# Patient Record
Sex: Female | Born: 1955
Health system: Southern US, Community
[De-identification: ages and names within clinical notes are randomized; demographics above are authoritative.]

## PROBLEM LIST (undated history)

## (undated) ENCOUNTER — Inpatient Hospital Stay (HOSPITAL_BASED_OUTPATIENT_CLINIC_OR_DEPARTMENT_OTHER): Payer: Self-pay | Admitting: Specialist

## (undated) ENCOUNTER — Ambulatory Visit (HOSPITAL_BASED_OUTPATIENT_CLINIC_OR_DEPARTMENT_OTHER): Payer: Self-pay | Admitting: Podiatrist

## (undated) ENCOUNTER — Encounter (HOSPITAL_BASED_OUTPATIENT_CLINIC_OR_DEPARTMENT_OTHER): Payer: Self-pay

## (undated) VITALS — BP 136/78 | HR 82 | Temp 98.0°F | Resp 18

## (undated) VITALS — BP 134/72 | HR 71 | Temp 97.7°F | Resp 18 | Wt 240.0 lb

## (undated) VITALS — BP 131/66 | HR 77 | Temp 97.8°F | Resp 18 | Ht 61.0 in | Wt 195.0 lb

## (undated) DIAGNOSIS — F1920 Other psychoactive substance dependence, uncomplicated: Secondary | ICD-10-CM

## (undated) DIAGNOSIS — M1990 Unspecified osteoarthritis, unspecified site: Secondary | ICD-10-CM

## (undated) DIAGNOSIS — J449 Chronic obstructive pulmonary disease, unspecified: Secondary | ICD-10-CM

## (undated) DIAGNOSIS — I639 Cerebral infarction, unspecified: Secondary | ICD-10-CM

## (undated) DIAGNOSIS — E669 Obesity, unspecified: Secondary | ICD-10-CM

## (undated) DIAGNOSIS — F419 Anxiety disorder, unspecified: Secondary | ICD-10-CM

## (undated) DIAGNOSIS — F32A Depression, unspecified: Secondary | ICD-10-CM

## (undated) DIAGNOSIS — F329 Major depressive disorder, single episode, unspecified: Secondary | ICD-10-CM

## (undated) DIAGNOSIS — E119 Type 2 diabetes mellitus without complications: Secondary | ICD-10-CM

## (undated) DIAGNOSIS — M7989 Other specified soft tissue disorders: Secondary | ICD-10-CM

## (undated) DIAGNOSIS — J45909 Unspecified asthma, uncomplicated: Secondary | ICD-10-CM

## (undated) DIAGNOSIS — R7303 Prediabetes: Secondary | ICD-10-CM

## (undated) DIAGNOSIS — K829 Disease of gallbladder, unspecified: Secondary | ICD-10-CM

## (undated) DIAGNOSIS — K219 Gastro-esophageal reflux disease without esophagitis: Secondary | ICD-10-CM

## (undated) DIAGNOSIS — I10 Essential (primary) hypertension: Secondary | ICD-10-CM

## (undated) DIAGNOSIS — G4730 Sleep apnea, unspecified: Secondary | ICD-10-CM

## (undated) DIAGNOSIS — M549 Dorsalgia, unspecified: Secondary | ICD-10-CM

## (undated) DIAGNOSIS — M2550 Pain in unspecified joint: Secondary | ICD-10-CM

## (undated) DIAGNOSIS — E785 Hyperlipidemia, unspecified: Secondary | ICD-10-CM

## (undated) DIAGNOSIS — E039 Hypothyroidism, unspecified: Secondary | ICD-10-CM

## (undated) DIAGNOSIS — I011 Acute rheumatic endocarditis: Secondary | ICD-10-CM

## (undated) DIAGNOSIS — G5761 Lesion of plantar nerve, right lower limb: Secondary | ICD-10-CM

## (undated) DIAGNOSIS — E559 Vitamin D deficiency, unspecified: Secondary | ICD-10-CM

## (undated) DIAGNOSIS — E2740 Unspecified adrenocortical insufficiency: Secondary | ICD-10-CM

## (undated) DIAGNOSIS — D649 Anemia, unspecified: Secondary | ICD-10-CM

## (undated) DIAGNOSIS — D490 Neoplasm of unspecified behavior of digestive system: Secondary | ICD-10-CM

## (undated) DIAGNOSIS — R002 Palpitations: Secondary | ICD-10-CM

## (undated) DIAGNOSIS — C801 Malignant (primary) neoplasm, unspecified: Secondary | ICD-10-CM

## (undated) DIAGNOSIS — G709 Myoneural disorder, unspecified: Secondary | ICD-10-CM

## (undated) DIAGNOSIS — M797 Fibromyalgia: Secondary | ICD-10-CM

## (undated) HISTORY — PX: PR ANES IPER LOWER ABD W/LAPS RAD HYSTERECTOMY: 00846

## (undated) HISTORY — PX: ENDOMETRIAL ABLTJ THERMAL W/O HYSTEROSCOPIC GID: REP180

## (undated) HISTORY — PX: PR ANES HRNA REPAIR UPR ABD TABDL RPR DIPHRG HRNA: 00756

## (undated) HISTORY — PX: RPR 1ST INGUN HRNA PRETERM INFT RDC: PRO125

## (undated) HISTORY — DX: Unspecified asthma, uncomplicated: J45.909

## (undated) HISTORY — DX: Unspecified osteoarthritis, unspecified site: M19.90

## (undated) HISTORY — PX: ABDOMINAL HYSTERECTOMY: SHX81

## (undated) HISTORY — DX: Other specified soft tissue disorders: M79.89

## (undated) HISTORY — DX: Palpitations: R00.2

## (undated) HISTORY — DX: Anemia, unspecified: D64.9

## (undated) HISTORY — DX: Lesion of plantar nerve, right lower limb: G57.61

## (undated) HISTORY — DX: Disease of gallbladder, unspecified: K82.9

## (undated) HISTORY — DX: Malignant (primary) neoplasm, unspecified: C80.1

## (undated) HISTORY — DX: Acute rheumatic endocarditis: I01.1

## (undated) HISTORY — PX: THYROIDECTOMY: SHX17

## (undated) HISTORY — DX: Dorsalgia, unspecified: M54.9

## (undated) HISTORY — PX: HAMMER TOE SURGERY: SHX385

## (undated) HISTORY — DX: Prediabetes: R73.03

## (undated) HISTORY — PX: CHOLECYSTECTOMY: SHX55

## (undated) HISTORY — DX: Pain in unspecified joint: M25.50

## (undated) HISTORY — DX: Vitamin D deficiency, unspecified: E55.9

## (undated) SURGERY — OSTEOTOMY, METATARSAL BONE
Anesthesia: Monitor Anesthesia Care | Laterality: Right

## (undated) SURGERY — ARTHROPLASTY, KNEE, TOTAL
Anesthesia: Spinal with Block | Laterality: Right

---
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
## 1998-02-12 ENCOUNTER — Ambulatory Visit (HOSPITAL_COMMUNITY): Admission: RE | Admit: 1998-02-12 | Discharge: 1998-02-12 | Payer: Self-pay | Admitting: General Surgery

## 1999-03-19 ENCOUNTER — Other Ambulatory Visit: Admission: RE | Admit: 1999-03-19 | Discharge: 1999-03-19 | Payer: Self-pay | Admitting: Obstetrics and Gynecology

## 1999-06-10 ENCOUNTER — Ambulatory Visit (HOSPITAL_COMMUNITY): Admission: RE | Admit: 1999-06-10 | Discharge: 1999-06-10 | Payer: Self-pay | Admitting: Gastroenterology

## 1999-10-27 ENCOUNTER — Emergency Department (HOSPITAL_BASED_OUTPATIENT_CLINIC_OR_DEPARTMENT_OTHER): Payer: Self-pay | Admitting: Emergency Medicine

## 1999-12-16 ENCOUNTER — Emergency Department (HOSPITAL_BASED_OUTPATIENT_CLINIC_OR_DEPARTMENT_OTHER): Payer: Self-pay | Admitting: Emergency Medicine

## 2000-01-25 ENCOUNTER — Emergency Department (HOSPITAL_BASED_OUTPATIENT_CLINIC_OR_DEPARTMENT_OTHER): Payer: Self-pay | Admitting: Neurology

## 2000-02-18 ENCOUNTER — Emergency Department (HOSPITAL_BASED_OUTPATIENT_CLINIC_OR_DEPARTMENT_OTHER): Payer: Self-pay | Admitting: Emergency Medicine

## 2000-02-29 ENCOUNTER — Emergency Department (HOSPITAL_BASED_OUTPATIENT_CLINIC_OR_DEPARTMENT_OTHER): Payer: Self-pay | Admitting: Internal Medicine

## 2000-03-20 ENCOUNTER — Emergency Department (HOSPITAL_BASED_OUTPATIENT_CLINIC_OR_DEPARTMENT_OTHER): Payer: Self-pay | Admitting: Emergency Medicine

## 2000-03-20 LAB — BLOOD COUNT COMPLETE AUTO&AUTO DIFRNTL WBC
BASOPHIL %: 0.7 % (ref 0–2)
EOSINOPHIL %: 0.3 % (ref 0–5)
HEMATOCRIT: 42.9 % (ref 34.9–46.9)
HEMOGLOBIN: 14 g/dL (ref 11.7–15.7)
LYMPHOCYTE %: 10.8 % — ABNORMAL LOW (ref 20–45)
MEAN CORP HGB CONC: 32.7 g/dL (ref 31.4–35.8)
MEAN CORPUSCULAR HGB: 28.7 pg (ref 26.4–34.0)
MEAN CORPUSCULAR VOL: 87.7 fl (ref 80.5–99.7)
MEAN PLATELET VOLUME: 8.4 fL — ABNORMAL LOW (ref 9.0–11.8)
MONOCYTE %: 2.5 % (ref 0–12)
NEUTROPHIL %: 85.7 % — ABNORMAL HIGH (ref 40–70)
PLATELET COUNT: 267 10*3/uL (ref 130–400)
RBC DISTRIBUTION WIDTH: 12.2 % (ref 11.0–15.0)
RED BLOOD CELL COUNT: 4.89 M/uL (ref 3.80–5.20)
WHITE BLOOD CELL COUNT: 7.3 10*3/uL (ref 4.5–11.0)

## 2000-03-20 LAB — SERUM DRUG SCREEN 6 DRUGS
ACETAMINOPHEN: <10 ug/ml (ref 0–25)
BARBITURATE: NEGATIVE
BENZODIAZEPINE: NEGATIVE
ETHANOL: NOT DETECTED mg/dl
SALICYLATE: <3 mg/dl (ref 0–20)
TRICYCLIC ANTIDEPRESSANTS: NEGATIVE

## 2000-03-20 LAB — BASIC METABOLIC PANEL
BUN (UREA NITROGEN): 14 mg/dl (ref 6–20)
CALCIUM: 8.7 mg/dl (ref 8.4–10.2)
CARBON DIOXIDE: 24 mEQ/L (ref 22–31)
CHLORIDE: 105 mEQ/L (ref 96–106)
CREATININE: 0.9 mg/dl (ref 0.4–1.2)
Glucose Random: 124 mg/dl — ABNORMAL HIGH (ref 70–110)
POTASSIUM: 4.2 mEQ/L (ref 3.4–5.0)
SODIUM: 141 mEQ/L (ref 134–145)

## 2000-03-20 LAB — URINE DRUG SCREEN 2 DRUGS
COCAINE METABOLITES URINE: POSITIVE — ABNORMAL HIGH
OPIATES URINE: POSITIVE — ABNORMAL HIGH

## 2000-03-23 ENCOUNTER — Other Ambulatory Visit: Admission: RE | Admit: 2000-03-23 | Discharge: 2000-03-23 | Payer: Self-pay | Admitting: Obstetrics and Gynecology

## 2000-04-30 ENCOUNTER — Emergency Department (HOSPITAL_BASED_OUTPATIENT_CLINIC_OR_DEPARTMENT_OTHER): Payer: Self-pay | Admitting: Emergency Medicine

## 2000-04-30 LAB — URINE CULTURE/COLONY COUNT: URINE CULTURE/COLONY COUNT: NO GROWTH

## 2000-11-14 ENCOUNTER — Emergency Department (HOSPITAL_BASED_OUTPATIENT_CLINIC_OR_DEPARTMENT_OTHER): Payer: Self-pay | Admitting: Emergency Medicine

## 2001-05-10 ENCOUNTER — Other Ambulatory Visit: Admission: RE | Admit: 2001-05-10 | Discharge: 2001-05-10 | Payer: Self-pay | Admitting: Obstetrics and Gynecology

## 2001-05-23 ENCOUNTER — Emergency Department: Payer: Self-pay | Admitting: Emergency Medicine

## 2001-05-25 ENCOUNTER — Emergency Department: Payer: Self-pay | Admitting: Emergency Medicine

## 2001-07-30 ENCOUNTER — Emergency Department: Payer: Self-pay | Admitting: Emergency Medicine

## 2001-07-30 LAB — XR ANKLE RIGHT MINIMUM 3 VIEWS

## 2001-07-30 LAB — XR KNEE RIGHT MINIMUM 4 VIEWS

## 2001-08-24 ENCOUNTER — Emergency Department: Payer: Self-pay | Admitting: Emergency Medicine

## 2001-11-22 ENCOUNTER — Emergency Department: Payer: Self-pay | Admitting: Emergency Medicine

## 2002-06-04 LAB — URINALYSIS
BILIRUBIN, URINE: NEGATIVE
CASTS: NONE SEEN PER LPF
CRYSTALS: NONE SEEN
GLUCOSE, URINE: NEGATIVE MG/DL
KETONE, URINE: NEGATIVE MG/DL
NITRITE, URINE: NEGATIVE
PH URINE: 5.5 (ref 5.0–8.0)
PROTEIN, URINE: NEGATIVE MG/DL
SPECIFIC GRAVITY URINE: 1.015 (ref 1.003–1.035)
SQUAMOUS EPITHELIAL CELLS: >10 PER LPF — ABNORMAL HIGH (ref 0–2)

## 2002-06-20 ENCOUNTER — Other Ambulatory Visit: Admission: RE | Admit: 2002-06-20 | Discharge: 2002-06-20 | Payer: Self-pay | Admitting: Obstetrics and Gynecology

## 2002-07-21 ENCOUNTER — Emergency Department: Payer: Self-pay | Admitting: Emergency Medicine

## 2002-08-29 ENCOUNTER — Emergency Department (HOSPITAL_BASED_OUTPATIENT_CLINIC_OR_DEPARTMENT_OTHER): Payer: Self-pay | Admitting: Internal Medicine

## 2003-07-31 ENCOUNTER — Other Ambulatory Visit: Admission: RE | Admit: 2003-07-31 | Discharge: 2003-07-31 | Payer: Self-pay | Admitting: Obstetrics and Gynecology

## 2003-08-23 ENCOUNTER — Emergency Department (HOSPITAL_BASED_OUTPATIENT_CLINIC_OR_DEPARTMENT_OTHER): Payer: Self-pay | Admitting: Emergency Medicine

## 2003-08-24 LAB — XR HIP RIGHT MINIMUM 2 VIEWS

## 2003-08-24 LAB — XR PELVIS AP 1 OR 2 VIEWS

## 2004-08-08 ENCOUNTER — Other Ambulatory Visit: Admission: RE | Admit: 2004-08-08 | Discharge: 2004-08-08 | Payer: Self-pay | Admitting: Obstetrics and Gynecology

## 2004-08-16 ENCOUNTER — Ambulatory Visit (HOSPITAL_COMMUNITY): Admission: RE | Admit: 2004-08-16 | Discharge: 2004-08-16 | Payer: Self-pay | Admitting: Obstetrics and Gynecology

## 2004-09-30 ENCOUNTER — Encounter (HOSPITAL_COMMUNITY): Admission: RE | Admit: 2004-09-30 | Discharge: 2004-10-01 | Payer: Self-pay | Admitting: Endocrinology

## 2004-10-14 ENCOUNTER — Emergency Department: Payer: Self-pay | Admitting: Emergency Medicine

## 2004-10-14 LAB — XR CHEST 2 VIEWS

## 2004-10-16 ENCOUNTER — Encounter (INDEPENDENT_AMBULATORY_CARE_PROVIDER_SITE_OTHER): Payer: Self-pay | Admitting: Specialist

## 2004-10-16 ENCOUNTER — Encounter: Admission: RE | Admit: 2004-10-16 | Discharge: 2004-10-16 | Payer: Self-pay | Admitting: General Surgery

## 2004-10-16 ENCOUNTER — Other Ambulatory Visit: Admission: RE | Admit: 2004-10-16 | Discharge: 2004-10-16 | Payer: Self-pay | Admitting: Interventional Radiology

## 2004-12-12 ENCOUNTER — Encounter: Admission: RE | Admit: 2004-12-12 | Discharge: 2004-12-12 | Payer: Self-pay | Admitting: Orthopedic Surgery

## 2005-03-07 ENCOUNTER — Ambulatory Visit (HOSPITAL_COMMUNITY): Admission: RE | Admit: 2005-03-07 | Discharge: 2005-03-07 | Payer: Self-pay | Admitting: Endocrinology

## 2005-04-09 ENCOUNTER — Ambulatory Visit (HOSPITAL_COMMUNITY): Admission: RE | Admit: 2005-04-09 | Discharge: 2005-04-10 | Payer: Self-pay | Admitting: General Surgery

## 2005-04-09 ENCOUNTER — Encounter (INDEPENDENT_AMBULATORY_CARE_PROVIDER_SITE_OTHER): Payer: Self-pay | Admitting: *Deleted

## 2005-05-29 ENCOUNTER — Ambulatory Visit (HOSPITAL_COMMUNITY): Admission: RE | Admit: 2005-05-29 | Discharge: 2005-05-30 | Payer: Self-pay | Admitting: General Surgery

## 2005-05-29 ENCOUNTER — Encounter (INDEPENDENT_AMBULATORY_CARE_PROVIDER_SITE_OTHER): Payer: Self-pay | Admitting: *Deleted

## 2005-06-27 ENCOUNTER — Encounter (HOSPITAL_COMMUNITY): Admission: RE | Admit: 2005-06-27 | Discharge: 2005-09-25 | Payer: Self-pay | Admitting: Endocrinology

## 2005-07-10 ENCOUNTER — Ambulatory Visit (HOSPITAL_COMMUNITY): Admission: RE | Admit: 2005-07-10 | Discharge: 2005-07-10 | Payer: Self-pay | Admitting: Endocrinology

## 2005-07-18 ENCOUNTER — Encounter (HOSPITAL_COMMUNITY): Admission: RE | Admit: 2005-07-18 | Discharge: 2005-10-16 | Payer: Self-pay | Admitting: Endocrinology

## 2005-09-06 ENCOUNTER — Emergency Department: Payer: Self-pay | Admitting: Emergency Medicine

## 2005-09-08 LAB — EMERGENCY ROOM NOTE

## 2005-11-29 ENCOUNTER — Emergency Department (HOSPITAL_BASED_OUTPATIENT_CLINIC_OR_DEPARTMENT_OTHER): Payer: Self-pay | Admitting: Neurology

## 2005-11-29 LAB — URINALYSIS
BACTERIA: NONE SEEN PER HPF (ref 0–?)
BILIRUBIN, URINE: NEGATIVE
CASTS: NONE SEEN PER LPF
CRYSTALS: NONE SEEN
GLUCOSE, URINE: NEGATIVE MG/DL
KETONE, URINE: NEGATIVE MG/DL
NITRITE, URINE: NEGATIVE
OCCULT BLOOD, URINE: NEGATIVE
PH URINE: 7.5 (ref 5.0–8.0)
PROTEIN, URINE: NEGATIVE MG/DL
RED BLOOD CELLS URINE: NONE SEEN PER HPF (ref 0–2)
SPECIFIC GRAVITY URINE: 1.01 (ref 1.003–1.035)

## 2005-12-01 LAB — URINE CULTURE/COLONY COUNT: URINE CULTURE/COLONY COUNT: NO GROWTH

## 2005-12-22 ENCOUNTER — Emergency Department: Payer: Self-pay | Admitting: Emergency Medicine

## 2005-12-24 LAB — EMERGENCY ROOM NOTE

## 2005-12-26 LAB — EMERGENCY ROOM NOTE

## 2005-12-31 ENCOUNTER — Encounter: Admission: RE | Admit: 2005-12-31 | Discharge: 2005-12-31 | Payer: Self-pay | Admitting: Obstetrics and Gynecology

## 2006-01-28 ENCOUNTER — Encounter (HOSPITAL_BASED_OUTPATIENT_CLINIC_OR_DEPARTMENT_OTHER): Payer: Self-pay | Admitting: Internal Medicine

## 2006-02-13 ENCOUNTER — Emergency Department (HOSPITAL_BASED_OUTPATIENT_CLINIC_OR_DEPARTMENT_OTHER): Payer: Self-pay | Admitting: Emergency Medicine

## 2006-02-13 LAB — URINALYSIS
BILIRUBIN, URINE: NEGATIVE
CASTS: NONE SEEN PER LPF
CRYSTALS: NONE SEEN
GLUCOSE, URINE: NEGATIVE MG/DL
KETONE, URINE: NEGATIVE MG/DL
NITRITE, URINE: NEGATIVE
OCCULT BLOOD, URINE: NEGATIVE
PH URINE: 6 (ref 5.0–8.0)
PROTEIN, URINE: NEGATIVE MG/DL
RED BLOOD CELLS URINE: NONE SEEN PER HPF (ref 0–2)
SPECIFIC GRAVITY URINE: <=1.005 (ref 1.003–1.035)

## 2006-02-13 LAB — BLOOD COUNT COMPLETE AUTO&AUTO DIFRNTL WBC
BASOPHIL %: 0.6 % (ref 0–2)
EOSINOPHIL %: 3.1 % (ref 0–5)
HEMATOCRIT: 37.6 % (ref 34.9–46.9)
HEMOGLOBIN: 12.6 g/dL (ref 11.7–15.7)
LYMPHOCYTE %: 38.7 % (ref 20–45)
MEAN CORP HGB CONC: 33.4 g/dL (ref 31.4–35.8)
MEAN CORPUSCULAR HGB: 28.4 pg (ref 26.4–34.0)
MEAN CORPUSCULAR VOL: 85.2 fl (ref 80.5–99.7)
MEAN PLATELET VOLUME: 7.2 fL (ref 6.8–12.8)
MONOCYTE %: 6.8 % (ref 0–12)
NEUTROPHIL %: 50.8 % (ref 40–70)
PLATELET COUNT: 255 10*3/uL (ref 130–400)
RBC DISTRIBUTION WIDTH: 12.9 % (ref 11.0–15.0)
RED BLOOD CELL COUNT: 4.42 M/uL (ref 3.80–5.20)
WHITE BLOOD CELL COUNT: 8.7 10*3/uL (ref 4.5–11.0)

## 2006-02-13 LAB — COMPREHENSIVE METABOLIC PANEL
ALANINE AMINOTRANSFERASE: 17 IU/L (ref 7–35)
ALBUMIN: 3.9 g/dl (ref 3.4–4.8)
ALKALINE PHOSPHATASE: 58 IU/L (ref 25–106)
ANION GAP: 5 mmol/L (ref 2–25)
ASPARTATE AMINOTRANSFERASE: 21 IU/L (ref 8–34)
BILIRUBIN TOTAL: 0.6 mg/dl (ref 0.2–1.1)
BUN (UREA NITROGEN): 12 mg/dl (ref 6–20)
CALCIUM: 9 mg/dl (ref 8.6–10.0)
CARBON DIOXIDE: 28 mmol/L (ref 22–32)
CHLORIDE: 100 mmol/L — ABNORMAL LOW (ref 101–111)
CREATININE: 0.8 mg/dl (ref 0.4–1.2)
Glucose Random: 116 mg/dl (ref 74–160)
POTASSIUM: 4.2 mmol/L (ref 3.5–5.1)
SODIUM: 133 mmol/L — ABNORMAL LOW (ref 135–144)
TOTAL PROTEIN: 6 g/dl (ref 5.9–7.5)

## 2006-02-13 LAB — LIPASE: LIPASE: 26 U/L (ref 10–50)

## 2006-02-14 LAB — XR ABDOMEN (KUB) WITH UPRIGHT AND OR DECUBITUS

## 2006-02-15 LAB — URINE CULTURE/COLONY COUNT: URINE CULTURE/COLONY COUNT: NO GROWTH

## 2006-02-16 LAB — CT ABDOMEN W IV CONTRAST

## 2006-02-16 LAB — CT PELVIS W CONTRAST

## 2006-02-16 LAB — EMERGENCY ROOM NOTE

## 2006-03-13 ENCOUNTER — Ambulatory Visit (HOSPITAL_BASED_OUTPATIENT_CLINIC_OR_DEPARTMENT_OTHER): Payer: Self-pay | Admitting: Internal Medicine

## 2006-03-13 NOTE — Progress Notes (Signed)
DNKA. Notes prepared in preparation for today's visit:    50 yo F to establish primary care and with complaints of:     Abdominal pain.  Hysterectomy  CT on 02/14/06 showed small anterior abdominal wall hernia  Labs normal at that time    Back pain    Left hip pain      PAST MEDICAL HISTORY:   Chronic depression/anxiety,   Multiple abdominal surgeries  Back pain -- multiple ER visits 2001-2006  Opiates and cocaine in urine in 2001.

## 2006-06-05 ENCOUNTER — Ambulatory Visit (HOSPITAL_BASED_OUTPATIENT_CLINIC_OR_DEPARTMENT_OTHER): Payer: MEDICARE | Admitting: Internal Medicine

## 2006-06-05 ENCOUNTER — Encounter (HOSPITAL_BASED_OUTPATIENT_CLINIC_OR_DEPARTMENT_OTHER): Payer: Self-pay | Admitting: Internal Medicine

## 2006-06-05 VITALS — BP 124/98 | HR 76 | Ht 66.0 in | Wt 201.0 lb

## 2006-06-05 DIAGNOSIS — F329 Major depressive disorder, single episode, unspecified: Secondary | ICD-10-CM

## 2006-06-05 DIAGNOSIS — G5600 Carpal tunnel syndrome, unspecified upper limb: Secondary | ICD-10-CM

## 2006-06-05 DIAGNOSIS — F1120 Opioid dependence, uncomplicated: Secondary | ICD-10-CM | POA: Insufficient documentation

## 2006-06-05 DIAGNOSIS — I8310 Varicose veins of unspecified lower extremity with inflammation: Secondary | ICD-10-CM

## 2006-06-05 DIAGNOSIS — F411 Generalized anxiety disorder: Secondary | ICD-10-CM

## 2006-06-05 DIAGNOSIS — M5430 Sciatica, unspecified side: Secondary | ICD-10-CM

## 2006-06-05 DIAGNOSIS — Z0000 Encounter for general adult medical examination without abnormal findings: Secondary | ICD-10-CM

## 2006-06-05 DIAGNOSIS — F32A Depression, unspecified: Secondary | ICD-10-CM | POA: Insufficient documentation

## 2006-06-05 DIAGNOSIS — Z23 Encounter for immunization: Secondary | ICD-10-CM

## 2006-06-05 DIAGNOSIS — M25569 Pain in unspecified knee: Secondary | ICD-10-CM

## 2006-06-05 MED ORDER — TRAZODONE HCL 50 MG PO TABS
ORAL_TABLET | ORAL | Status: AC
Start: 2006-06-05 — End: 2007-06-06

## 2006-06-05 MED ORDER — IBUPROFEN 600 MG PO TABS
ORAL_TABLET | ORAL | Status: DC
Start: 2006-06-05 — End: 2006-07-27

## 2006-06-05 MED ORDER — VARENICLINE TARTRATE 0.5 MG X 11 & 1 MG X 42 PO MISC
ORAL | Status: AC
Start: 2006-06-05 — End: 2006-06-19

## 2006-06-05 MED ORDER — VARENICLINE TARTRATE 1 MG PO TABS
ORAL_TABLET | ORAL | Status: AC
Start: 2006-07-04 — End: 2006-09-03

## 2006-06-05 NOTE — Progress Notes (Signed)
51 year old female to establish primary care and with complaints of:    Varicose veins  Painful, R>L    Ingrown toenails  Recently removed  Plan for foot surgery    Abdominal pain:  Resolved. Abd CT in 10/07 showed small anterior abdominal wall hernia. No bowel obstruction or free air. No diverticulitis or appendicitis. Abdominal pain was ultimately found to be related to withdrawal from methadone for too-rapid taper.  Endometriosis, status post several surgeries 1995-1998, multiple abdominal surgeries including hysterectomy and hernia repair.    Opioid dependence  Following treatment with opioid medications for abdominal pain.  Has been going to methadone clinic for four years.  Tapered gradually from 150 mg down to 97 mg, tapering 1 mg every 2-4 weeks.  Family members are unaware.    Anxiety.  Sees Dr. Dorothy Puffer at [facility]    Smoking.  1 PPD  Previously tried patch.    Obesity.  Has lost 50 pounds from maximum weight.    ROS: Multiple orthopedic problems  Knee pain.  Bursitis.  Sciatica.  Carpal tunnel.    HCM.  Has never had a mammogram.    Review of patient's family history indicates:   Heart Father    Comment: CAD, died age 55   Heart Mother    Comment: CAD   Heart Brother    Comment: CAD, died age 80   Pulmonary Father    Comment: COPD   Cancer - Breast Maternal Aunt    Comment: age 83   Cancer - Breast Maternal Aunt    Comment: age 47    Social History   Marital Status: Legally Separated Spouse Name:    Years of Education: Number of children:     Occupational History   None on file    Social History Main Topics   Tobacco Use: Yes Packs/Day: 1 Years: 5    Comment: quit 1980-1996, then light until 2003.   Alcohol Use: No    Drug Use: past    Social History Narrative   Three children, divorced, 1 daughter and 2 sons (25-27)   3 grandchildren     BP 124/98   Pulse 76   Ht 5\' 6"  (1.65m)   Wt 201 lbs (91.2kg)   SaO2 96%   LMP Hysterectomy  GEN: Alert and oriented, cooperative.   HEENT: Sclerae anicteric, oropharynx  clear.   NECK: No thyromegaly or adenopathy  LUNGS: Clear to auscultation.   BREAST: No mass. Self exam taught and encouraged.  CARDIOVASCULAR: Regular rate and rhythm, normal S1 and S2 without murmurs, rubs, or gallops.  ABDOMEN: Soft, no tenderness, masses or organomegaly. EXTREMITIES: Warm and well perfused, + R>L LE varicosities, mild R>L edema.  NEUROLOGIC: Nonfocal with normal reflexes  SKIN: No rashes.    Assessment and plan:  Smoking. Action phase  - varenicline  - The patient was educated regarding the risks, benefits, and potential side effects of new medication.    Anxiety  - follow up with psychaitrist    Opioid dependence  - gradually tapering methadone    HCM  - referred for mammogram  - check lipids  - check fasting glucose to screen for DM because off DM    06/05/2006  VIS given prior to administration and reviewed with the patient and or legal guardian. Patient understands the disease and the vaccine. See immunization/Injection module or chart review for date of publication and additional information.  Hildred Laser, MD

## 2006-06-08 ENCOUNTER — Telehealth (HOSPITAL_BASED_OUTPATIENT_CLINIC_OR_DEPARTMENT_OTHER): Payer: Self-pay

## 2006-06-08 ENCOUNTER — Encounter (HOSPITAL_BASED_OUTPATIENT_CLINIC_OR_DEPARTMENT_OTHER): Payer: MEDICARE

## 2006-06-08 NOTE — Telephone Encounter (Signed)
Staff Message copied by Georgina Quint on 06/08/2006 at 2:11 PM  ------   Message from: Lane Hacker   Created: 06/08/2006 at 9:12 AM   Regarding: Prior authorization    Annjanette Wertenberger [id number], 51 year old, female, Telephone Information:  Home Phone [phone]  Work Phone [phone]      [name] NUMBER: [phone]    Patient's language of care: English    Patient's PCP: Hildred Laser, MD    Person calling on behalf of patient: patient (self)    Calls today for a prior authorization on her Chantix.     Patient's Preferred Pharmacy: [facility], [facility] # [phone], Fax # [phone]

## 2006-06-08 NOTE — Telephone Encounter (Signed)
Returned call; left message to speak with pt re: rx. Tel call to [facility] re: ? PA; pt insurance requires PA from [place]. Called [employer] @ [phone] for PA form.

## 2006-06-09 ENCOUNTER — Encounter (HOSPITAL_BASED_OUTPATIENT_CLINIC_OR_DEPARTMENT_OTHER): Payer: Self-pay | Admitting: Internal Medicine

## 2006-06-12 ENCOUNTER — Encounter (HOSPITAL_BASED_OUTPATIENT_CLINIC_OR_DEPARTMENT_OTHER): Payer: MEDICARE

## 2006-06-18 DIAGNOSIS — F17200 Nicotine dependence, unspecified, uncomplicated: Secondary | ICD-10-CM | POA: Insufficient documentation

## 2006-06-18 NOTE — Telephone Encounter (Signed)
PA completed and faxed

## 2006-06-18 NOTE — Telephone Encounter (Signed)
PA form not found as having been faxed by [employer]. Called again and requested PA form.

## 2006-06-19 ENCOUNTER — Other Ambulatory Visit (HOSPITAL_BASED_OUTPATIENT_CLINIC_OR_DEPARTMENT_OTHER): Payer: MEDICARE

## 2006-06-25 ENCOUNTER — Telehealth (HOSPITAL_BASED_OUTPATIENT_CLINIC_OR_DEPARTMENT_OTHER): Payer: Self-pay | Admitting: Internal Medicine

## 2006-06-25 NOTE — Telephone Encounter (Signed)
Staff Message copied by Earney Navy on 06/25/2006 at 12:44 PM  ------   Message from: [name]   Created: 06/25/2006 at 12:30 PM   Regarding: PA not rec at [facility]    Cheryl Dixon [id number], 51 year old, female, Telephone Information:  Home Phone [phone]  Work Phone [phone]  Home Phone [phone]      [name] NUMBER: [phone] Evlyn Kanner phone:   Other phone:    Available times:    Patient's language of care: English    Patient does not need an interpreter.    Patient's PCP: Hildred Laser, MD    Person calling on behalf of patient: patient (self)        Ziyah, called [facility] and spoke with Joni Reining (confirmation [phone]) that the have not rec the Prior Authorization for Chantix.     Calls today prior authorization    Patient's Preferred Pharmacy:   No Pharmacies Listed

## 2006-06-25 NOTE — Telephone Encounter (Signed)
Spoke with pt. Aware that the PA for her Chantrix was denied. Pt requested cost of medication and was able to provide that to her from the information on lexi-comp. Paperwork forwarded to provider for review.

## 2006-06-26 ENCOUNTER — Encounter (HOSPITAL_BASED_OUTPATIENT_CLINIC_OR_DEPARTMENT_OTHER): Payer: MEDICARE | Admitting: Internal Medicine

## 2006-06-26 ENCOUNTER — Other Ambulatory Visit (HOSPITAL_BASED_OUTPATIENT_CLINIC_OR_DEPARTMENT_OTHER): Payer: MEDICARE

## 2006-06-26 NOTE — Telephone Encounter (Signed)
Paperwork indicates that patient needs to be in a behavior modification program and to meet criteria for nicotine/substance dependence (305.1) and nicotine withdrawal (292.0).    I suppose that counseling from our health counselor would qualify as a behavior modification program. Please see if patient is willing to do that and if so schedule an appointment with Jonita Albee and resubmit request with that information and these codes. I will leave form in RN box.

## 2006-07-01 ENCOUNTER — Encounter: Admission: RE | Admit: 2006-07-01 | Discharge: 2006-07-01 | Payer: Self-pay | Admitting: Endocrinology

## 2006-07-02 ENCOUNTER — Encounter (HOSPITAL_BASED_OUTPATIENT_CLINIC_OR_DEPARTMENT_OTHER): Payer: Self-pay | Admitting: Internal Medicine

## 2006-07-03 ENCOUNTER — Ambulatory Visit (HOSPITAL_BASED_OUTPATIENT_CLINIC_OR_DEPARTMENT_OTHER): Payer: MEDICARE | Admitting: Internal Medicine

## 2006-07-03 VITALS — BP 110/80 | HR 68 | Temp 97.1°F | Wt 200.0 lb

## 2006-07-03 DIAGNOSIS — R350 Frequency of micturition: Secondary | ICD-10-CM

## 2006-07-03 DIAGNOSIS — F1121 Opioid dependence, in remission: Secondary | ICD-10-CM

## 2006-07-03 DIAGNOSIS — Z01818 Encounter for other preprocedural examination: Secondary | ICD-10-CM

## 2006-07-03 LAB — URINE DIP (POINT OF CARE)
BILIRUBIN, URINE: NEGATIVE mg/dl (ref 0–0)
GLUCOSE, URINE: NORMAL mg/dl (ref 0–0)
KETONE, URINE: NEGATIVE mg/dl (ref 0–0)
LEUKOCYTE ESTERASE: NEGATIVE Leu/mcl (ref 0–0)
NITRITE, URINE: NEGATIVE
OCCULT BLOOD, URINE: NEGATIVE mg/dl (ref 0–0)
PH URINE: 5.5 (ref 5.0–8.0)
PROTEIN, URINE: NEGATIVE mg/dl (ref 0–15)
SPECIFIC GRAVITY URINE: 1.015 (ref 1.003–1.030)
UROBILINOGEN URINE: NORMAL mg/dl (ref 0.2–1.0)

## 2006-07-03 LAB — BLOOD COUNT COMPLETE AUTOMATED
HEMATOCRIT: 41 % (ref 36.0–48.0)
HEMOGLOBIN: 13.7 g/dl (ref 12.0–16.0)
MEAN CORP HGB CONC: 33.5 g/dl (ref 32.0–36.0)
MEAN CORPUSCULAR HGB: 28.1 pg (ref 27.0–33.0)
MEAN CORPUSCULAR VOL: 83.8 fl (ref 80.0–100.0)
MEAN PLATELET VOLUME: 7.7 fl (ref 6.4–10.8)
PLATELET COUNT: 281 10*3/uL (ref 150–400)
RBC DISTRIBUTION WIDTH: 14.1 % (ref 11.5–14.3)
RED BLOOD CELL COUNT: 4.89 M/uL (ref 4.50–5.10)
WHITE BLOOD CELL COUNT: 6.5 10*3/uL (ref 4.0–10.8)

## 2006-07-03 LAB — BASIC METABOLIC PANEL
ANION GAP: 5 mmol/L (ref 2–25)
BUN (UREA NITROGEN): 6 mg/dl (ref 6–20)
CALCIUM: 9.3 mg/dl (ref 8.6–10.0)
CARBON DIOXIDE: 31 mmol/L (ref 22–32)
CHLORIDE: 101 mmol/L (ref 101–111)
CREATININE: 0.8 mg/dl (ref 0.4–1.2)
Glucose Random: 108 mg/dl (ref 74–160)
POTASSIUM: 5 mmol/L (ref 3.5–5.1)
SODIUM: 137 mmol/L (ref 135–144)

## 2006-07-03 LAB — PROTHROMBIN TIME
INR: <1 — ABNORMAL LOW (ref 2.0–3.5)
PROTHROMBIN TIME: 9.9 SECONDS (ref 9.5–11.5)

## 2006-07-03 LAB — EKG

## 2006-07-03 LAB — CHG SEDIMENTATION RATE RBC NON-AUTOMATED: RBC SEDIMENTATION RATE: 5 MM/HR (ref 0–15)

## 2006-07-03 LAB — APTT: APTT: 26.6 SECONDS (ref 23.7–33.3)

## 2006-07-03 NOTE — Progress Notes (Signed)
51 year old female seen for preop evaluation for foot surgery.    Pre-op evaluation  Plans to have foot surgery by Dr. Melida Gimenez  Scheduled for 3/12.  Her exercise tolerance is good, able to climb two flights of stairs and do heavy housework without chest pain or shortness of breath.    Abdominal pain  Also complains of abdominal cramps and urinary urgency for two weeks. Had been tapering methadone, and had similar symptoms with methadone taper previously, but no taper in dose for several weeks.    Smoking.  Shortness of breath when climbing a flight of stairs.  Has tried patch.   Has tried hypnosis.    Patient Active Problem List:   TOBACCO USE DISORDER [305.1]   Date Noted: 06/18/2006   ANXIETY STATE NOS [300.00]   Date Noted: 06/05/2006   Comment: [facility]. Dorothy Puffer at [facility]   OPIOID DEPENDENCE-REMISS [304.03]   Date Noted: 06/05/2006   Comment: Methadone clinic [facility] [place]. Counselor    Consuella Lose    [phone]  Current outpatient prescriptions:  METHADONE HCL 10 MG/ML OR CONC, about 96 mg daily from clinic, Disp: 0, Rfl: 0  VARENICLINE TARTRATE 1 MG OR TABS, 1 TABLET TWICE DAILY, Disp: 60, Rfl: 0  CLONAZEPAM 1 MG OR TABS, 1 TABLET 3 TIMES DAILY, Disp: 0, Rfl: 0  WELLBUTRIN SR 100 MG OR TBCR, 1 TABLET TWICE DAILY from psychiatrist, Disp: 0, Rfl: 0  IBUPROFEN 600 MG OR TABS, 1 TABLET 3 TIMES DAILY, Disp: 90, Rfl: 0  TRAZODONE HCL 50 MG OR TABS, 1 TABLET AT BEDTIME AS NEEDED FOR SLEEP FROM PSYCHIATRIST, Disp: 0, Rfl: 0    All:  No Known Allergies.     Review of patient's family history indicates:   Heart Father    Comment: CAD, died age 93   Heart Mother    Comment: CAD   Heart Brother    Comment: CAD, died age 33   Pulmonary Father    Comment: COPD   Cancer - Breast Maternal Aunt    Comment: age 21   Cancer - Breast Maternal Aunt    Comment: age 11    Social History   Marital Status: Legally Separated Spouse Name:    Years of Education: Number of children:     Occupational History   None on file    Social History Main  Topics   Tobacco Use: Yes Packs/Day: 1 Years: 5    Comment: quit 1980-1996, then light until 2003.    Social History Narrative   Three children, divorced, 1 daughter and 2 sons (25-27)   3 grandchildren    BP 110/80   Pulse 68   Temp (Src) 97.1 (Oral)   Wt 200 lbs (90.7kg)   SaO2 98%   LMP Hysterectomy  GEN: Alert and oriented, cooperative.   HEENT: Sclerae anicteric, oropharynx clear.   NECK: No thyromegaly or adenopathy  LUNGS: Clear to auscultation.   CARDIOVASCULAR: Regular rate and rhythm, normal S1 and S2 without murmurs, rubs, or gallops.  ABDOMEN: Soft, no tenderness, masses or organomegaly. EXTREMITIES: Warm and well perfused without clubbing, cyanosis, or edema.  NEUROLOGIC: Nonfocal with normal reflexes  SKIN: No rashes    EKG: Normal    Assessment and plan:  Preoperative evaluation.  Patient with no risk factors and good exercise tolerance, may proceed to surgery without additional cardiac evaluation.    Urinary frequency  UA without evidence of UTI    Smoking. Action phase  - varenicline  - The patient  was educated regarding the risks, benefits, and potential side effects of new medication.

## 2006-07-04 ENCOUNTER — Encounter (HOSPITAL_BASED_OUTPATIENT_CLINIC_OR_DEPARTMENT_OTHER): Payer: Self-pay | Admitting: Internal Medicine

## 2006-07-04 NOTE — Progress Notes (Addendum)
Quick Note:    Please send letter with test results and this comment::    All labs were normal and will be faxed to Dr. Melida Gimenez.  ______

## 2006-07-04 NOTE — Progress Notes (Addendum)
Quick Note:    Cheryl Dixon -- please forward this EKG to Dr. Melida Gimenez along with clinic note.  ______

## 2006-07-09 NOTE — Telephone Encounter (Signed)
Pt seen

## 2006-07-16 ENCOUNTER — Encounter (HOSPITAL_BASED_OUTPATIENT_CLINIC_OR_DEPARTMENT_OTHER): Payer: Self-pay | Admitting: Internal Medicine

## 2006-07-17 ENCOUNTER — Telehealth (HOSPITAL_BASED_OUTPATIENT_CLINIC_OR_DEPARTMENT_OTHER): Payer: Self-pay | Admitting: Internal Medicine

## 2006-07-17 NOTE — Telephone Encounter (Signed)
Spoke with the VNA. Confirmed fax number. Current medication list, problem list and recent office visit faxed off as requested. Forwarded to provider as Lorain Childes.

## 2006-07-17 NOTE — Telephone Encounter (Signed)
Staff Message copied by Earney Navy on Fri Jul 17, 2006 4:07 PM  ------   Message from: Lane Hacker   Created: Fri Jul 17, 2006 3:29 PM   Regarding: Question    Daphne Karrer [id number], 51 year old, female, Telephone Information:  Home Phone [phone]  Work Phone [phone]  Home Phone [phone]      [name] NUMBER: [phone]   FAX: [phone]    Patient's language of care: English    Patient's PCP: Hildred Laser, MD    Person calling on behalf of patient: patient (self)    Calls today saying her VNA, Wynonia Sours would like to get her test results, and any recent progress notes from Dr. Diona Foley to be faxed at the [facility] office.   If any question, Tashara can be reached at [phone].  Thank you.

## 2006-07-21 ENCOUNTER — Telehealth (HOSPITAL_BASED_OUTPATIENT_CLINIC_OR_DEPARTMENT_OTHER): Payer: Self-pay | Admitting: Internal Medicine

## 2006-07-21 NOTE — Telephone Encounter (Signed)
Staff Message copied by Earney Navy on Tue Jul 21, 2006 1:09 PM  ------   Message from: [name]   Created: Tue Jul 21, 2006 12:24 PM   Regarding: Cheryl Dixon [id number], 51 year old, female, Telephone Information:  Home Phone [phone]  Work Phone [phone]  Home Phone [phone]      [name] NUMBER: [phone]  Cell phone:   Other phone:    Available times:    Patient's language of care: English    Patient does not need an interpreter.    Patient's PCP: Hildred Laser, MD    Person calling on behalf of patient: patient (self)    Calls today PT WAS DENIED PA ON STOP SMOKING PATCH. PT WOULD LIKE TO KNOW IF DR. ROLL COULD DO SOMETHING SO SHE CAN GET IT    Patient's Preferred Pharmacy:   No Pharmacies Listed

## 2006-07-21 NOTE — Telephone Encounter (Signed)
Spoke with pt. Provided her with the Mass Smokers help line, number 1 . She will call to see if this Candy Sledge is something she will be able to accomplish. She will callback when she decides. Forwarded to PCP.

## 2006-07-27 ENCOUNTER — Other Ambulatory Visit (HOSPITAL_BASED_OUTPATIENT_CLINIC_OR_DEPARTMENT_OTHER): Payer: Self-pay

## 2006-07-27 DIAGNOSIS — M25569 Pain in unspecified knee: Secondary | ICD-10-CM

## 2006-07-27 DIAGNOSIS — K5900 Constipation, unspecified: Secondary | ICD-10-CM

## 2006-07-27 DIAGNOSIS — M5430 Sciatica, unspecified side: Secondary | ICD-10-CM

## 2006-07-27 DIAGNOSIS — G5600 Carpal tunnel syndrome, unspecified upper limb: Secondary | ICD-10-CM

## 2006-07-27 MED ORDER — IBUPROFEN 600 MG PO TABS
ORAL_TABLET | ORAL | Status: DC
Start: 2006-07-27 — End: 2007-02-10

## 2006-07-27 MED ORDER — DOCUSATE SODIUM 100 MG PO CAPS
ORAL_CAPSULE | ORAL | Status: DC
Start: 2006-07-27 — End: 2007-08-04

## 2006-07-27 NOTE — Telephone Encounter (Signed)
Rx's called in . 

## 2006-07-27 NOTE — Telephone Encounter (Signed)
Staff Message copied by Georgina Quint on Mon Jul 27, 2006 4:45 PM  ------   Message from: [name]   Created: Mon Jul 27, 2006 4:38 PM   Regarding: MEDS    Cheryl Dixon [id number], 51 year old, female, Telephone Information:  Home Phone [phone]  Work Phone [phone]  Home Phone [phone]      [name] NUMBER: [phone]  Cell phone:   Other phone:    Available times:    Patient's language of care: English    Patient does not need an interpreter.    Patient's PCP: Hildred Laser, MD    Person calling on behalf of patient: patient (self)    Calls today PT NEEDS MOTRIN AND STOOL SOFTENER. [name] [phone]    Patient's Preferred Pharmacy:   No Pharmacies Listed

## 2006-08-07 LAB — EMERGENCY ROOM NOTE

## 2006-08-21 ENCOUNTER — Encounter (HOSPITAL_BASED_OUTPATIENT_CLINIC_OR_DEPARTMENT_OTHER): Payer: Self-pay | Admitting: Internal Medicine

## 2006-08-28 ENCOUNTER — Other Ambulatory Visit (HOSPITAL_BASED_OUTPATIENT_CLINIC_OR_DEPARTMENT_OTHER): Payer: MEDICARE

## 2006-09-29 ENCOUNTER — Encounter (HOSPITAL_BASED_OUTPATIENT_CLINIC_OR_DEPARTMENT_OTHER): Payer: Self-pay | Admitting: Internal Medicine

## 2006-09-30 ENCOUNTER — Encounter: Admission: RE | Admit: 2006-09-30 | Discharge: 2006-09-30 | Payer: Self-pay | Admitting: Obstetrics and Gynecology

## 2006-10-12 ENCOUNTER — Encounter (HOSPITAL_BASED_OUTPATIENT_CLINIC_OR_DEPARTMENT_OTHER): Payer: Self-pay | Admitting: Internal Medicine

## 2006-10-12 ENCOUNTER — Ambulatory Visit (HOSPITAL_BASED_OUTPATIENT_CLINIC_OR_DEPARTMENT_OTHER): Payer: MEDICARE | Admitting: Surgery

## 2006-10-17 ENCOUNTER — Emergency Department (HOSPITAL_BASED_OUTPATIENT_CLINIC_OR_DEPARTMENT_OTHER)
Admission: RE | Admit: 2006-10-17 | Disposition: A | Discharge status: Home / Self Care | Payer: Self-pay | Source: Emergency Department

## 2006-10-21 LAB — EMERGENCY ROOM NOTE

## 2006-10-22 ENCOUNTER — Ambulatory Visit (HOSPITAL_BASED_OUTPATIENT_CLINIC_OR_DEPARTMENT_OTHER): Payer: Self-pay | Admitting: Surgery

## 2006-10-22 LAB — SURGICAL SPECIALTIES LETTER

## 2006-10-23 ENCOUNTER — Ambulatory Visit (HOSPITAL_BASED_OUTPATIENT_CLINIC_OR_DEPARTMENT_OTHER): Payer: Self-pay | Admitting: Surgery

## 2006-11-02 ENCOUNTER — Ambulatory Visit (HOSPITAL_BASED_OUTPATIENT_CLINIC_OR_DEPARTMENT_OTHER): Payer: MEDICARE | Admitting: Surgery

## 2006-11-16 ENCOUNTER — Ambulatory Visit (HOSPITAL_BASED_OUTPATIENT_CLINIC_OR_DEPARTMENT_OTHER): Payer: MEDICARE | Admitting: Internal Medicine

## 2006-11-16 NOTE — Progress Notes (Signed)
51 year old female to establish primary care and with complaints of:    Varicose veins  Painful, R>L  Missed follow up with Dr.     Ova Fresh[place] problems  Foot surgery with Dr. Melida Gimenez    Abdominal pain:  Resolved. Abd CT in 10/07 showed small anterior abdominal wall hernia. No bowel obstruction or free air. No diverticulitis or appendicitis. Abdominal pain was ultimately found to be related to withdrawal from methadone for too-rapid taper.  Endometriosis, status post several surgeries 1995-1998, multiple abdominal surgeries including hysterectomy and hernia repair.    Opioid dependence  Following treatment with opioid medications for abdominal pain.  Has been going to methadone clinic for four years.  Tapered gradually from 150 mg down to 97 mg, tapering 1 mg every 2-4 weeks.  Family members are unaware.    Anxiety.  Sees Dr. Dorothy Puffer at [facility]    Smoking.  Started on varenicline at last visit.    Obesity.  Maximum weight was 250    ROS: Multiple orthopedic problems  Knee pain.  Bursitis.  Sciatica.  Carpal tunnel.    HCM.  Has never had a mammogram.This encounter was opened in error. Please disregard.

## 2007-01-12 LAB — DUPLEX REFLUX LOWER EXTREMITY BILATERAL

## 2007-02-01 ENCOUNTER — Ambulatory Visit (HOSPITAL_BASED_OUTPATIENT_CLINIC_OR_DEPARTMENT_OTHER): Payer: MEDICARE | Admitting: Surgery

## 2007-02-10 ENCOUNTER — Other Ambulatory Visit (HOSPITAL_BASED_OUTPATIENT_CLINIC_OR_DEPARTMENT_OTHER): Payer: Self-pay

## 2007-02-10 DIAGNOSIS — G5600 Carpal tunnel syndrome, unspecified upper limb: Secondary | ICD-10-CM

## 2007-02-10 DIAGNOSIS — M5430 Sciatica, unspecified side: Secondary | ICD-10-CM

## 2007-02-10 DIAGNOSIS — M25569 Pain in unspecified knee: Secondary | ICD-10-CM

## 2007-02-10 MED ORDER — IBUPROFEN 600 MG PO TABS
ORAL_TABLET | ORAL | Status: DC
Start: 2007-02-10 — End: 2008-05-18

## 2007-02-10 NOTE — Telephone Encounter (Signed)
Pt left vmail requesting refill Motrin.

## 2007-02-11 NOTE — Telephone Encounter (Signed)
Rx called in 

## 2007-02-15 ENCOUNTER — Encounter (HOSPITAL_BASED_OUTPATIENT_CLINIC_OR_DEPARTMENT_OTHER): Payer: Self-pay | Admitting: Social Worker

## 2007-02-15 NOTE — Progress Notes (Addendum)
This Clinical research associate spoke with pt., who said she needs transportation to surgery this thurday at [facility]. Pt. [place] Spaniel she knows she doesn't have any existing active PT-1 forms to go to [facility] . Pt. Said she will arrange a cab for trip to the hospital and will have someone pick her up for the ride home. Pt. Has no requests of social service at this time.

## 2007-02-15 NOTE — Progress Notes (Signed)
This Clinical research associate spoke with Dondra Prader of dr. Wynona Dove ofice at [facility] and informed her of this writer's discussion with pt.

## 2007-02-15 NOTE — Progress Notes (Signed)
This Clinical research associate received a message on 02/15/07 from [place] of the front desk indicating that Cheryl Dixon ? [facility] had called regarding transportation for pt. This writer called [phone] and left a message for Cheryl Dixon asking for return call.

## 2007-02-16 LAB — PROTHROMBIN TIME
INR: 1 — ABNORMAL LOW (ref 2.0–3.5)
PROTHROMBIN TIME: 10 SECONDS (ref 9.5–11.5)

## 2007-02-16 LAB — APTT: APTT: 26.1 SECONDS (ref 23.7–33.3)

## 2007-02-16 LAB — SURG SPEC CLINIC NOTE

## 2007-02-19 ENCOUNTER — Ambulatory Visit (HOSPITAL_BASED_OUTPATIENT_CLINIC_OR_DEPARTMENT_OTHER): Payer: Self-pay | Admitting: Surgery

## 2007-02-22 ENCOUNTER — Ambulatory Visit (HOSPITAL_BASED_OUTPATIENT_CLINIC_OR_DEPARTMENT_OTHER): Payer: Self-pay | Admitting: Surgery

## 2007-02-22 LAB — BLOOD SUGAR FINGERSTICK (POINT OF CARE): FINGERSTICK GLUCOSE: 108 mg/dl (ref 74–160)

## 2007-02-22 LAB — SURGICAL PATH SPECIMEN

## 2007-03-01 ENCOUNTER — Ambulatory Visit (HOSPITAL_BASED_OUTPATIENT_CLINIC_OR_DEPARTMENT_OTHER): Payer: MEDICARE | Admitting: Surgery

## 2007-03-10 LAB — OPERATIVE REPORT

## 2007-04-05 ENCOUNTER — Telehealth (HOSPITAL_BASED_OUTPATIENT_CLINIC_OR_DEPARTMENT_OTHER): Payer: Self-pay | Admitting: Internal Medicine

## 2007-04-05 NOTE — Telephone Encounter (Signed)
Spk w/patient said will go the week of 04/12/07 will fax req to [facility]

## 2007-07-02 ENCOUNTER — Ambulatory Visit (HOSPITAL_BASED_OUTPATIENT_CLINIC_OR_DEPARTMENT_OTHER): Payer: Self-pay | Admitting: Internal Medicine

## 2007-08-02 ENCOUNTER — Emergency Department (HOSPITAL_BASED_OUTPATIENT_CLINIC_OR_DEPARTMENT_OTHER)
Admission: RE | Admit: 2007-08-02 | Disposition: A | Discharge status: Home / Self Care | Payer: Self-pay | Source: Emergency Department | Attending: Emergency Medicine | Admitting: Emergency Medicine

## 2007-08-02 ENCOUNTER — Telehealth (HOSPITAL_BASED_OUTPATIENT_CLINIC_OR_DEPARTMENT_OTHER): Payer: Self-pay | Admitting: Internal Medicine

## 2007-08-02 ENCOUNTER — Encounter (HOSPITAL_BASED_OUTPATIENT_CLINIC_OR_DEPARTMENT_OTHER): Payer: Self-pay

## 2007-08-02 HISTORY — DX: Depression, unspecified: F32.A

## 2007-08-02 HISTORY — DX: Unspecified osteoarthritis, unspecified site: M19.90

## 2007-08-02 HISTORY — DX: Major depressive disorder, single episode, unspecified: F32.9

## 2007-08-02 LAB — DUP-SCAN XTR VEINS UNILATERAL/LIMITED STUDY

## 2007-08-02 MED ORDER — LOVENOX 60 MG/0.6ML SC SOLN
SUBCUTANEOUS | Status: AC
Start: 2007-08-02 — End: 2007-09-01

## 2007-08-02 MED ORDER — PERCOCET 5-325 MG PO TABS
ORAL_TABLET | ORAL | Status: AC
Start: 2007-08-02 — End: 2007-08-09

## 2007-08-02 NOTE — ED Notes (Signed)
PT C/O PAINFUL VERICOSE VEINS LEFT INNER LEG, PAIN HAS INCRESES SINCE YESTERDAY.

## 2007-08-02 NOTE — Telephone Encounter (Signed)
Front desk --  Please call her to schedule an appt with me this week, OK to OB any day but Tuesday.    Pt with extensive superficial thrombophlebitis in ED, no DVT.  Started on lovenox.  Instructed to follow up with me to consider continuation of anticoagulation.

## 2007-08-03 LAB — EMERGENCY ROOM NOTE

## 2007-08-04 ENCOUNTER — Ambulatory Visit (HOSPITAL_BASED_OUTPATIENT_CLINIC_OR_DEPARTMENT_OTHER): Payer: MEDICARE | Admitting: Emergency Medicine

## 2007-08-04 VITALS — BP 120/84 | HR 71 | Temp 97.0°F | Wt 181.0 lb

## 2007-08-04 DIAGNOSIS — F1121 Opioid dependence, in remission: Secondary | ICD-10-CM

## 2007-08-04 DIAGNOSIS — K5900 Constipation, unspecified: Secondary | ICD-10-CM

## 2007-08-04 DIAGNOSIS — I8000 Phlebitis and thrombophlebitis of superficial vessels of unspecified lower extremity: Secondary | ICD-10-CM

## 2007-08-04 MED ORDER — METHADONE HCL 10 MG/ML PO CONC
ORAL | Status: DC
Start: 2007-08-04 — End: 2009-04-25

## 2007-08-04 MED ORDER — IBUPROFEN 600 MG PO TABS
ORAL_TABLET | ORAL | Status: DC
Start: 2007-08-04 — End: 2007-08-12

## 2007-08-04 MED ORDER — DOCUSATE SODIUM 100 MG PO CAPS
ORAL_CAPSULE | ORAL | Status: DC
Start: 2007-08-04 — End: 2008-05-18

## 2007-08-04 NOTE — Patient Instructions (Signed)
Do not use ibuprofen while on lovenox.    Vascular Lab study for left leg - to be scheduled.

## 2007-08-04 NOTE — Progress Notes (Signed)
Urgent visit for ED follow up    Cheryl Dixon is a 52 year old female dx with superficial thrombophlebitis left leg.  Ultrasound was negative on 4/6.  Plan per Dr Diona Foley - repeat ultrasound and d/c lovenox if study is negatvie.    Pt reports improvement in leg, + swelling in lower leg.  Continued pain.    ROS: no fever, chills, CP SOB      Past Medical History    Arthritis     Varicose Veins of Lower Extremities     Depression          Past Surgical History    ANES HRNA REPAIR UPR ABD TABDL RPR DIPHRG HRNA     ENDOMETRIAL ABLTJ THERMAL W/O HYSTEROSCOPIC GID     ANES IPR LOWER ABD W/LAPS RAD HYSTERECTOMY          Family History    Heart Father    Comment: CAD, died age 72    Heart Mother    Comment: CAD    Heart Brother    Comment: CAD, died age 60    Pulmonary Father    Comment: COPD    Cancer - Breast Maternal Aunt    Comment: age 10    Cancer - Breast Maternal Aunt    Comment: age 53       Social History    Marital Status: Legally Separated   Spouse Name:                       Years of Education:                 Number of children:               Social History Main Topics    Tobacco Use: Yes           Packs/Day: 1     Years: 5          Comment: quit 1980-1996, then light until 2003.    Alcohol Use: No              Drug Use: No                 Comment: past  Social History Narrative    Three children, divorced, 1 daughter and 2 sons (25-27)    3 grandchildren    Sister of Micael Hampshire and daughter of Glenda Chroman    BP 120/84   Pulse 71   Temp (Src) 97 F (36.1 C) (Oral)   Wt 181 lb (82.101 kg)   SpO2 96%   LMP Hysterectomy  EOMI, pharynx benign, TMs wnl, no cervical adenopathy.  Chest: clear  Cor: RRR  Abd: soft, non-tender  Extremities: tender varicosities left inner thigh.  No redness.    ASSESSMENT/PLAN:  1.  THrombophlebitis:  Per DR Roll - pt to continue on lovenox, repeat ultrasound.

## 2007-08-06 ENCOUNTER — Telehealth (HOSPITAL_BASED_OUTPATIENT_CLINIC_OR_DEPARTMENT_OTHER): Payer: Self-pay | Admitting: Internal Medicine

## 2007-08-06 ENCOUNTER — Other Ambulatory Visit (HOSPITAL_BASED_OUTPATIENT_CLINIC_OR_DEPARTMENT_OTHER): Payer: Self-pay | Admitting: Internal Medicine

## 2007-08-06 DIAGNOSIS — I8000 Phlebitis and thrombophlebitis of superficial vessels of unspecified lower extremity: Secondary | ICD-10-CM

## 2007-08-06 NOTE — Telephone Encounter (Signed)
EPIC reviewed.  Pt has a negative study on 08/02/07. Providers guidance from 08/04/07:  Cheryl Dixon is a 52 year old female dx with superficial thrombophlebitis left leg. Ultrasound was negative on 4/6. Plan per Dr Diona Foley - repeat ultrasound and d/c lovenox if study is negatvie.  Requested front desk staff call and get study scheduled. Refer to providers note if Vascular lab has questions.

## 2007-08-06 NOTE — Telephone Encounter (Signed)
Staff Message copied by Earney Navy on Fri Aug 06, 2007 11:22 AM  ------   Message from: Hale Bogus   Created: Fri Aug 06, 2007 9:54 AM   Regarding: RE EVALUATION FOR VASCULAR LAB STUDY   Contact: [phone]    VASCULAR LAB STUDY SHOULD HAVE BEEN BOOKED WITHIN 24 HRS OF APT. PLEASE HAVE PATIENT COME IN AND BE RE EVALUATED PATIENT CAN BE REACHED AT [phone]

## 2007-08-10 ENCOUNTER — Ambulatory Visit (HOSPITAL_BASED_OUTPATIENT_CLINIC_OR_DEPARTMENT_OTHER): Payer: MEDICARE | Admitting: Surgery

## 2007-08-11 ENCOUNTER — Encounter: Admission: RE | Admit: 2007-08-11 | Discharge: 2007-08-11 | Payer: Self-pay | Admitting: Endocrinology

## 2007-08-19 ENCOUNTER — Telehealth (HOSPITAL_BASED_OUTPATIENT_CLINIC_OR_DEPARTMENT_OTHER): Payer: Self-pay | Admitting: Internal Medicine

## 2007-08-19 NOTE — Telephone Encounter (Addendum)
Pap hcm field updated.

## 2007-08-19 NOTE — Telephone Encounter (Signed)
Spoke with patient . She had a hystorectomy . She will be [facility] the [facility] to do the walkin for mammo

## 2007-08-20 ENCOUNTER — Encounter (HOSPITAL_BASED_OUTPATIENT_CLINIC_OR_DEPARTMENT_OTHER): Payer: Self-pay | Admitting: Internal Medicine

## 2007-08-21 ENCOUNTER — Encounter (HOSPITAL_BASED_OUTPATIENT_CLINIC_OR_DEPARTMENT_OTHER): Payer: Self-pay

## 2007-08-21 ENCOUNTER — Emergency Department (HOSPITAL_BASED_OUTPATIENT_CLINIC_OR_DEPARTMENT_OTHER)
Admission: RE | Admit: 2007-08-21 | Disposition: A | Discharge status: Home / Self Care | Payer: Self-pay | Source: Emergency Department | Attending: Neurology | Admitting: Neurology

## 2007-08-21 MED ORDER — TRAMADOL HCL 50 MG PO TABS
ORAL_TABLET | ORAL | Status: AC
Start: 2007-08-21 — End: 2007-08-28

## 2007-08-21 NOTE — ED Notes (Signed)
Severe pain/numbness left leg for past month. Pt states treated about 2 weeks ago and had [place] and was on Lovenox for one week. One week after Lovenox pt began experiencing increase numbness. C/o severe pain at night.

## 2007-08-23 ENCOUNTER — Ambulatory Visit (HOSPITAL_BASED_OUTPATIENT_CLINIC_OR_DEPARTMENT_OTHER): Payer: MEDICARE | Admitting: Surgery

## 2007-08-29 LAB — EMERGENCY ROOM NOTE

## 2007-08-29 NOTE — Telephone Encounter (Signed)
Cheryl Dixon -- please schedule her to see me on 5/14.

## 2007-08-31 NOTE — Telephone Encounter (Signed)
Left msg for pt that appt made for her on MAy 14 at 1100.  Pt to call us back if this time is not agreeable.

## 2007-09-09 ENCOUNTER — Ambulatory Visit (HOSPITAL_BASED_OUTPATIENT_CLINIC_OR_DEPARTMENT_OTHER): Payer: Self-pay | Admitting: Internal Medicine

## 2007-09-20 ENCOUNTER — Emergency Department (HOSPITAL_BASED_OUTPATIENT_CLINIC_OR_DEPARTMENT_OTHER)
Admission: RE | Admit: 2007-09-20 | Disposition: A | Discharge status: Home / Self Care | Payer: Self-pay | Source: Emergency Department | Attending: Emergency Medicine | Admitting: Emergency Medicine

## 2007-09-20 ENCOUNTER — Encounter (HOSPITAL_BASED_OUTPATIENT_CLINIC_OR_DEPARTMENT_OTHER): Payer: Self-pay

## 2007-09-20 HISTORY — DX: Obesity, unspecified: E66.9

## 2007-09-20 HISTORY — DX: Other psychoactive substance dependence, uncomplicated: F19.20

## 2007-09-20 LAB — COMPREHENSIVE METABOLIC PANEL
ALANINE AMINOTRANSFERASE: 16 IU/L (ref 7–35)
ALBUMIN: 3.4 g/dl (ref 3.4–4.8)
ALKALINE PHOSPHATASE: 69 IU/L (ref 25–106)
ANION GAP: 5 mmol/L (ref 2–25)
ASPARTATE AMINOTRANSFERASE: 21 IU/L (ref 8–34)
BILIRUBIN TOTAL: 0.4 mg/dl (ref 0.2–1.1)
BUN (UREA NITROGEN): 6 mg/dl (ref 6–20)
CALCIUM: 8.6 mg/dl (ref 8.6–10.0)
CARBON DIOXIDE: 28 mmol/L (ref 22–32)
CHLORIDE: 98 mmol/L — ABNORMAL LOW (ref 101–111)
CREATININE: 0.8 mg/dl (ref 0.4–1.2)
ESTIMATED GLOMERULAR FILT RATE: >60 mL/min (ref 60–116)
Glucose Random: 139 mg/dl (ref 74–160)
POTASSIUM: 4.3 mmol/L (ref 3.5–5.1)
SODIUM: 131 mmol/L — ABNORMAL LOW (ref 135–144)
TOTAL PROTEIN: 5.6 g/dl — ABNORMAL LOW (ref 5.9–7.5)

## 2007-09-20 LAB — BLOOD COUNT COMPLETE AUTO&AUTO DIFRNTL WBC
BASOPHIL %: 0.1 % (ref 0.0–2.0)
EOSINOPHIL %: 1 % (ref 0.0–7.0)
HEMATOCRIT: 39.4 % (ref 36.0–48.0)
HEMOGLOBIN: 13.1 g/dl (ref 12.0–16.0)
LYMPHOCYTE %: 6.6 % — ABNORMAL LOW (ref 13.0–39.0)
MEAN CORP HGB CONC: 33.4 g/dl (ref 32.0–36.0)
MEAN CORPUSCULAR HGB: 28.7 pg (ref 27.0–33.0)
MEAN CORPUSCULAR VOL: 86 fl (ref 80.0–100.0)
MEAN PLATELET VOLUME: 8 fl (ref 6.4–10.8)
MONOCYTE %: 6.9 % (ref 1.0–12.0)
NEUTROPHIL %: 85.4 % — ABNORMAL HIGH (ref 46.0–79.0)
PLATELET COUNT: 246 10*3/uL (ref 150–400)
RBC DISTRIBUTION WIDTH: 13.9 % (ref 11.5–14.3)
RED BLOOD CELL COUNT: 4.58 M/uL (ref 4.50–5.10)
WHITE BLOOD CELL COUNT: 12.2 10*3/uL — ABNORMAL HIGH (ref 4.0–10.8)

## 2007-09-20 NOTE — ED Notes (Signed)
Pt called ems with c/o flu like symptoms. Pt c/o h/a, sore throat, and fever on arrival. vss

## 2007-10-06 LAB — EMERGENCY ROOM NOTE

## 2007-10-11 ENCOUNTER — Encounter: Admission: RE | Admit: 2007-10-11 | Discharge: 2007-10-11 | Payer: Self-pay | Admitting: Obstetrics and Gynecology

## 2007-11-08 ENCOUNTER — Emergency Department (HOSPITAL_BASED_OUTPATIENT_CLINIC_OR_DEPARTMENT_OTHER)
Admission: RE | Admit: 2007-11-08 | Disposition: A | Discharge status: Home / Self Care | Payer: Self-pay | Source: Ambulatory Visit

## 2007-11-08 ENCOUNTER — Encounter (HOSPITAL_BASED_OUTPATIENT_CLINIC_OR_DEPARTMENT_OTHER): Payer: Self-pay

## 2007-11-08 LAB — EMERGENCY ROOM NOTE

## 2007-11-08 MED ORDER — CIPRO 500 MG PO TABS
ORAL_TABLET | ORAL | Status: AC
Start: 2007-11-08 — End: 2007-11-15

## 2007-11-08 MED ORDER — PHENAZOPYRIDINE HCL 100 MG PO TABS
ORAL_TABLET | ORAL | Status: AC
Start: 2007-11-08 — End: 2007-11-15

## 2007-11-08 NOTE — ED Notes (Signed)
Pt states she has UTI

## 2008-01-17 ENCOUNTER — Ambulatory Visit (HOSPITAL_BASED_OUTPATIENT_CLINIC_OR_DEPARTMENT_OTHER): Payer: Self-pay | Admitting: Internal Medicine

## 2008-02-03 ENCOUNTER — Encounter (HOSPITAL_BASED_OUTPATIENT_CLINIC_OR_DEPARTMENT_OTHER): Payer: Self-pay | Admitting: Internal Medicine

## 2008-02-10 NOTE — Progress Notes (Signed)
DPH enrollment

## 2008-03-30 ENCOUNTER — Telehealth (HOSPITAL_BASED_OUTPATIENT_CLINIC_OR_DEPARTMENT_OTHER): Payer: Self-pay | Admitting: Internal Medicine

## 2008-03-30 NOTE — Telephone Encounter (Signed)
Hi   I saw Tom today, she asked about a new script for Colace. I saw that she had a script for a year last August from you.Diamantina [facility] send a script for 30 days, and will ask Takeyah to call you for an appt or ongoing scripts.  Erskine Squibb [place] ([facility])

## 2008-05-16 ENCOUNTER — Telehealth (HOSPITAL_BASED_OUTPATIENT_CLINIC_OR_DEPARTMENT_OTHER): Payer: Self-pay | Admitting: Registered Nurse

## 2008-05-16 DIAGNOSIS — N390 Urinary tract infection, site not specified: Secondary | ICD-10-CM

## 2008-05-16 NOTE — Telephone Encounter (Signed)
Patient has questions about antibiotics and methadone  Has had a UTI since 04/28/08. States she was treated at [facility],  On??? Cipro. Stopped because of concern " interaction with Methadone".    Requesting to speak with Dr. Diona Foley.  Please advise.  Does she need appt.?

## 2008-05-16 NOTE — Telephone Encounter (Signed)
1/1 went to [facility] with stomach cramps  Had UTI, given rx for amox and took it.  Still had Rx for cipro from 1/1 visit, then spoke to methadone clinic and didn't take.  Appt schedule for Thurs 1 pm to address abd pain  Lab orders entered to come in earlier for urine check if desired.

## 2008-05-16 NOTE — Telephone Encounter (Signed)
Front desk -- please add to my schedule Thursday 1/21 at 1 pm.

## 2008-05-16 NOTE — Telephone Encounter (Signed)
From Flor A. Early Osmond May 16, 2008 9:00 AM    To [name]    Phone [phone]    Subject UTI    Patient Cheryl Dixon [[id number]] (DOB: May 19, 1955)    Phone Entered Pt Work Pt Home     [phone]              Message [name] [id number], 54 year old, female, Telephone Information:  Home Phone [phone]  Work Phone [phone]  Home Phone Not on file.    [name] NUMBER: [phone]  Best time to call back:   Cell phone:   Other phone:    Available times:    Patient's language of care: English    Patient does not need an interpreter.    Patient's PCP: Hildred Laser, MD    Person calling on behalf of patient: Patient (self)    Calls today with a sick call. UTI    Patient's Preferred Pharmacy:   [name] [place]  Phone: [phone] Fax: [phone]

## 2008-05-18 ENCOUNTER — Ambulatory Visit (HOSPITAL_BASED_OUTPATIENT_CLINIC_OR_DEPARTMENT_OTHER): Payer: MEDICARE | Admitting: Internal Medicine

## 2008-05-18 ENCOUNTER — Encounter (HOSPITAL_BASED_OUTPATIENT_CLINIC_OR_DEPARTMENT_OTHER): Payer: Self-pay | Admitting: Internal Medicine

## 2008-05-18 VITALS — BP 108/80 | HR 90 | Temp 97.8°F | Wt 195.0 lb

## 2008-05-18 DIAGNOSIS — N390 Urinary tract infection, site not specified: Secondary | ICD-10-CM

## 2008-05-18 DIAGNOSIS — K219 Gastro-esophageal reflux disease without esophagitis: Secondary | ICD-10-CM

## 2008-05-18 DIAGNOSIS — M25569 Pain in unspecified knee: Secondary | ICD-10-CM

## 2008-05-18 DIAGNOSIS — Z23 Encounter for immunization: Secondary | ICD-10-CM

## 2008-05-18 DIAGNOSIS — M5430 Sciatica, unspecified side: Secondary | ICD-10-CM

## 2008-05-18 DIAGNOSIS — K5900 Constipation, unspecified: Secondary | ICD-10-CM

## 2008-05-18 DIAGNOSIS — G5600 Carpal tunnel syndrome, unspecified upper limb: Secondary | ICD-10-CM

## 2008-05-18 LAB — URINE DIP (POINT OF CARE)
BILIRUBIN, URINE: NEGATIVE (ref 0–0)
GLUCOSE, URINE: NEGATIVE mg/dl (ref 0–0)
KETONE, URINE: NEGATIVE mg/dl (ref 0–0)
LEUKOCYTE ESTERASE: 1 — AB (ref 0–0)
NITRITE, URINE: NEGATIVE
OCCULT BLOOD, URINE: NEGATIVE (ref 0–0)
PH URINE: 6.5 (ref 5.0–8.0)
PROTEIN, URINE: NEGATIVE mg/dl (ref 0–15)
SPECIFIC GRAVITY URINE: 1.01 (ref 1.003–1.030)
UROBILINOGEN URINE: 0.2 mg/dl (ref 0.2–1.0)

## 2008-05-18 MED ORDER — IBUPROFEN 600 MG PO TABS
ORAL_TABLET | ORAL | Status: DC
Start: 2008-05-18 — End: 2008-08-15

## 2008-05-18 MED ORDER — RANITIDINE HCL 150 MG PO TABS
ORAL_TABLET | ORAL | Status: DC
Start: 2008-05-18 — End: 2008-08-02

## 2008-05-18 MED ORDER — DOCUSATE SODIUM 100 MG PO CAPS
ORAL_CAPSULE | ORAL | Status: DC
Start: 2008-05-18 — End: 2013-09-30

## 2008-05-18 MED ORDER — BACTRIM DS 800-160 MG PO TABS
ORAL_TABLET | ORAL | Status: AC
Start: 2008-05-18 — End: 2008-05-21

## 2008-05-18 MED ORDER — FLUCONAZOLE 150 MG PO TABS
ORAL_TABLET | ORAL | Status: AC
Start: 2008-05-21 — End: 2008-05-22

## 2008-05-18 NOTE — Progress Notes (Signed)
53 year old female seen in urgent care complaining of:    Still having burning with urination and flank pain and urinary frequency  symptoms have been present since Christmas  1/1 went to [facility] with stomach cramps   Had UTI, given rx for amox and took it.   Still had Rx for cipro from 1/1 visit, then spoke to methadone clinic and didn't take.   Having some epigastric pain as well  Multiple home stressors    Opiate dependence  Has been on methadone for 5 years  Has gone down from 130 mg to 90 mg, then back up tot 95  Wants to transition to [employer] to psychiatrist about it.  Spoke to Dr. Melvyn Neth who also prescribes   Going to meetings, has a sponsor  No takehomes because smoking MJ    BP 108/80   Pulse 90   Temp (Src) 97.8 F (36.6 C) (Oral)   Wt 195 lb (88.451 kg)   SpO2 98%   LMP Hysterectomy  Abdomen reveals mild tenderness in the suprapubic area, without rebound, guarding, mass or organomegaly. Abdomen is soft and bowel sounds are normal.    +leuks only    Assessment and plan:  Soft evidence for UTI  - bactrim x 3 days  - urine for culture  - fluconazole at end of course  - Follow up if symptoms worsening or not improving as expected     Opiate dependence  Doing very well in recoverey except smoking marijuana  Reviewed that she would be a candidate for suboxone only after she is able to taper methadone down to 30 mg daily, which will take a long time  She agrees.    05/18/2008  VIS given prior to administration and reviewed with the patient and or legal guardian. Patient understands the disease and the vaccine. See immunization/Injection module or chart review for date of publication and additional information.  Hildred Laser, MD

## 2008-05-18 NOTE — Progress Notes (Signed)
Mammo booked for 05/22/08@ [facility] patient informed . Also booked colonoscopy with Dr. Sander Radon for 07/31/08 @ 2:30pm

## 2008-05-19 ENCOUNTER — Telehealth (HOSPITAL_BASED_OUTPATIENT_CLINIC_OR_DEPARTMENT_OTHER): Payer: Self-pay | Admitting: Internal Medicine

## 2008-05-19 LAB — URINE CULTURE/COLONY COUNT

## 2008-05-19 NOTE — Telephone Encounter (Signed)
Called and spoke with pt.  She thinks she may be having muscle spasms from regurgitation.  Advised that she continue the ranitidine, which she just started today, to see if the muscle spasms improve.  She agrees to plan.  Will use heat for muscle spasms.

## 2008-05-19 NOTE — Telephone Encounter (Signed)
Staff Message copied by Magnus Ivan on Fri May 19, 2008 12:24 PM  ------   Message from: [name]   Created: Fri May 19, 2008 11:19 AM   Regarding: New Prescription Requesting a muscle relaxer     Cheryl Dixon [id number], 53 year old, female, Telephone Information:  Home Phone [phone]  Work Phone [phone]  Home Phone Not on file.      [name] NUMBER: [phone]    Best time to call back:   Cell phone:   Other phone:    Available times:    Patient's language of care: English    Patient does not need an interpreter.    Patient's PCP: Hildred Laser, MD    Person calling on behalf of patient: Patient (self)    New Prescription Requesting a muscle relaxer   Because she is geratating       Calls today new prescription    [facility]  Phone: [phone] Fax: [phone]

## 2008-05-22 ENCOUNTER — Ambulatory Visit (HOSPITAL_BASED_OUTPATIENT_CLINIC_OR_DEPARTMENT_OTHER): Payer: Self-pay | Admitting: Internal Medicine

## 2008-05-22 NOTE — Progress Notes (Addendum)
Quick Note:    Please send letter:    Urine culture did not show any bacteria. Please contact me if you are continuing to have urinary symptoms after taking the medication so that we can evaluate this further.  ______

## 2008-05-26 ENCOUNTER — Telehealth (HOSPITAL_BASED_OUTPATIENT_CLINIC_OR_DEPARTMENT_OTHER): Payer: Self-pay | Admitting: Internal Medicine

## 2008-05-26 NOTE — Telephone Encounter (Signed)
Spoke with patient missed mammo scheduled for 1/25 rescheduled for 2/24@11AM 

## 2008-06-13 ENCOUNTER — Encounter (HOSPITAL_BASED_OUTPATIENT_CLINIC_OR_DEPARTMENT_OTHER): Payer: Self-pay | Admitting: Internal Medicine

## 2008-06-21 ENCOUNTER — Ambulatory Visit: Payer: Self-pay | Admitting: Internal Medicine

## 2008-06-21 LAB — MA SCREENING MAMMO BILATERAL WITH CAD

## 2008-06-21 NOTE — Progress Notes (Addendum)
Quick Note:    Please send letter:    Your mammogram is normal. A routine mammogram is recommended again in one year.     ______

## 2008-07-25 ENCOUNTER — Encounter (HOSPITAL_BASED_OUTPATIENT_CLINIC_OR_DEPARTMENT_OTHER): Payer: Self-pay

## 2008-07-25 ENCOUNTER — Emergency Department (HOSPITAL_BASED_OUTPATIENT_CLINIC_OR_DEPARTMENT_OTHER)
Admission: RE | Admit: 2008-07-25 | Disposition: A | Discharge status: Home / Self Care | Payer: Self-pay | Source: Emergency Department | Attending: Emergency Medicine | Admitting: Emergency Medicine

## 2008-07-25 LAB — CT HEAD WO CONTRAST

## 2008-07-25 LAB — CT CERVICAL SPINE WO CONTRAST

## 2008-07-25 MED ORDER — FLEXERIL 10 MG PO TABS
ORAL_TABLET | ORAL | Status: DC
Start: 2008-07-25 — End: 2008-08-02

## 2008-07-25 NOTE — ED Notes (Signed)
Arrives with EMS after a slip and fall at home. C/o head neck and bilat shoulder pain. Alert and moving all extrem Pt also clo lt ankle pain. Pt is on board and c/s precautions.

## 2008-07-26 LAB — XR THORACIC SPINE 2 VIEWS

## 2008-07-26 LAB — XR FOOT LEFT MINIMUM 3 VIEWS

## 2008-07-26 LAB — XR RIBS BILATERAL WITH PA CHEST

## 2008-07-26 LAB — XR SHOULDER RIGHT MINIMUM 2 VIEWS

## 2008-07-31 ENCOUNTER — Ambulatory Visit (HOSPITAL_BASED_OUTPATIENT_CLINIC_OR_DEPARTMENT_OTHER): Payer: MEDICARE | Admitting: Surgery

## 2008-08-02 ENCOUNTER — Ambulatory Visit (HOSPITAL_BASED_OUTPATIENT_CLINIC_OR_DEPARTMENT_OTHER): Payer: MEDICARE | Admitting: Internal Medicine

## 2008-08-02 ENCOUNTER — Encounter (HOSPITAL_BASED_OUTPATIENT_CLINIC_OR_DEPARTMENT_OTHER): Payer: Self-pay | Admitting: Internal Medicine

## 2008-08-02 DIAGNOSIS — F17200 Nicotine dependence, unspecified, uncomplicated: Secondary | ICD-10-CM

## 2008-08-02 DIAGNOSIS — M79672 Pain in left foot: Secondary | ICD-10-CM

## 2008-08-02 MED ORDER — NICODERM CQ 21 MG/24HR TD PT24
MEDICATED_PATCH | TRANSDERMAL | Status: AC
Start: 2008-08-02 — End: 2008-09-13

## 2008-08-02 MED ORDER — FLEXERIL 10 MG PO TABS
ORAL_TABLET | ORAL | Status: DC
Start: 2008-08-02 — End: 2008-08-21

## 2008-08-02 NOTE — Progress Notes (Signed)
Cheryl Dixon is a 53 year old female who is here for the following:    1) ED follow-up - s/p fall x 1 week after slipped on water in home; did hit head. Head CT neg, C-spine with mild disk disease.  Fell on [employer] - still with headaches - tension band around head/forehead.  Still with R shoulder pain. Ibuprofen taking twice - 3 times a day.  Using 1-2 a day for muscle strain - using ice on areas as well.  Would like to go to PT if possible for acute injury.  R shoulder with limited ROM, and pain in upper part of R foot    2) Smoking cessation - would like to start patch    PMH reviewed    Medication List reviewed and reconciled    ROS:  No fevers, chills  No headache  No SOB  No chest pain, but chest wall pain with inspiration on R side    PE:  BP 126/88   Pulse 84   Temp (Src) 98.4 F (36.9 C) (Oral)   Wt 187 lb 8 oz (85.049 kg)   SpO2 96%   LMP Hysterectomy  Pain Score: 7 (7/10)  GEN: well appearing, NAD  HEENT: EOMI, PERRL  CHEST: CTA B/L  CV: RRR, no M/R/G noted  MSK: limited ROM of R shoulder to 90 degrees, pain noted in bicep, not in shoulder joint itself, muscle tightness noted, TTP on L foot forefoot medially, no bony tenderness noted    ASSESSMENT/PLAN:  840.57F Sprains and Strains of Shoulder and Upper Arm  (primary encounter diagnosis)  729.5CG Left Foot Pain  Comment: Will refill flexeril to use for muscle tightness PRN, also will refer to PT to help with acute injury  Plan: FLEXERIL 10 MG OR TABS, REFERRAL TO PHYSICAL         THERAPY (EXT)    305.1 Tobacco Use Disorder  Comment: will start patch; handouts given on how to stop smoking, to set quite date, and gave pt number to try and get free patches  Plan: NICODERM CQ 21 MG/24HR TD PT24          I have reviewed the past medical, surgical, social and family history and updated these sections of EpicCare as relevant. All interim labs, test results, and consult notes were reviewed and discussed with [name]. Medications were reconciled during  this visit and a current medication list was given to the patient at the end of the visit.    Georgiana Spinner Darlene Bartelt,MD, 08/04/2008, 8:32 AM

## 2008-08-15 ENCOUNTER — Other Ambulatory Visit (HOSPITAL_BASED_OUTPATIENT_CLINIC_OR_DEPARTMENT_OTHER): Payer: Self-pay

## 2008-08-15 DIAGNOSIS — M5430 Sciatica, unspecified side: Secondary | ICD-10-CM

## 2008-08-15 DIAGNOSIS — G5600 Carpal tunnel syndrome, unspecified upper limb: Secondary | ICD-10-CM

## 2008-08-15 DIAGNOSIS — M25569 Pain in unspecified knee: Secondary | ICD-10-CM

## 2008-08-15 MED ORDER — IBUPROFEN 600 MG PO TABS
ORAL_TABLET | ORAL | Status: DC
Start: 2008-08-15 — End: 2008-11-14

## 2008-08-15 NOTE — Telephone Encounter (Signed)
Staff Message copied by Mylinda Latina on Tue Aug 15, 2008 1:27 PM  ------   Message from: [name]   Created: Tue Aug 15, 2008 1:05 PM   Regarding: REFILL:IBUPROFEN 600 MG OR TABS 90 1 05/18/2008 05/18/2009 --     Cheryl Dixon [id number], 53 year old, female, Telephone Information:  Home Phone [phone]  Work Phone [phone]  Home Phone Not on file.      [name] NUMBER: [phone]    Best time to call back:   Cell phone:   Other phone:    Available times:    Patient's language of care: English    Patient does not need an interpreter.    Patient's PCP: Hildred Laser, MD    Person calling on behalf of patient: Patient (self)  Pharmacy says they do not have refill     IBUPROFEN 600 MG OR TABS 90 1 05/18/2008 05/18/2009 --    Sig - Route: 1 TABLET 3 TIMES DAILY as needed - Oral    Class: E-Prescribe     Calls today for med refill(s).    Patient's [facility]erred [facility]   [facility]  Phone: [phone] Fax: [phone]

## 2008-08-15 NOTE — Telephone Encounter (Signed)
Pt calls for refill.  Last seen 1/10

## 2008-08-16 LAB — EMERGENCY ROOM NOTE

## 2008-08-21 ENCOUNTER — Other Ambulatory Visit (HOSPITAL_BASED_OUTPATIENT_CLINIC_OR_DEPARTMENT_OTHER): Payer: Self-pay | Admitting: Registered Nurse

## 2008-08-21 DIAGNOSIS — M79672 Pain in left foot: Secondary | ICD-10-CM

## 2008-08-21 MED ORDER — FLEXERIL 10 MG PO TABS
ORAL_TABLET | ORAL | Status: DC
Start: 2008-08-21 — End: 2008-09-11

## 2008-08-21 NOTE — Telephone Encounter (Signed)
[name]     Sent Aug 21, 2008 9:47 AM    To P Rh[facility] Rn Pool    Subject Refill:FLEXERIL 10 MG OR TABS 60 0 08/02/2008 09/01/2008 --     Patient Cheryl Dixon [[id number]] (DOB: 1955/12/31)    Phone Pt Work Pt Home Sender     [phone]              Message Cheryl Dixon [id number], 53 year old, female, Telephone Information:  Home Phone [phone]  Work Phone [phone]  Home Phone Not on file.      [name] NUMBER: [phone]  Best time to call back:   Cell phone:   Other phone:    Available times:    Patient's language of care: English    Patient does not need an interpreter.    Patient's PCP: Hildred Laser, MD    Person calling on behalf of patient: Patient (self)    First patient said that she only got 30 and needs refill - then I read the prescription to her and she said sometimes you have to take 2. I explained that I would pass her request on to Dr Diona Foley.    FLEXERIL 10 MG OR TABS 60 0 08/02/2008 09/01/2008 --   Sig - Route: Take 1 tab by mouth every 8 hours as needed for muscle spasm - Oral   Class: E-Prescribe       Calls today for med refill(s).    Patient's [facility]erred [facility]:   [facility]  Phone: [phone] Fax: [phone]

## 2008-09-11 ENCOUNTER — Telehealth (HOSPITAL_BASED_OUTPATIENT_CLINIC_OR_DEPARTMENT_OTHER): Payer: Self-pay | Admitting: Internal Medicine

## 2008-09-11 DIAGNOSIS — M79672 Pain in left foot: Secondary | ICD-10-CM

## 2008-09-11 MED ORDER — FLEXERIL 10 MG PO TABS
ORAL_TABLET | ORAL | Status: AC
Start: 2008-09-11 — End: 2008-11-11

## 2008-09-11 NOTE — Telephone Encounter (Signed)
Staff Message copied by Earney Navy on Mon Sep 11, 2008 3:39 PM  ------   Message from: Hilliard Clark A.   Created: Mon Sep 11, 2008 1:42 PM   Regarding: Back Pain   Contact: [phone]    Cheryl Dixon [id number], 53 year old, female, Telephone Information:  Home Phone [phone]  Work Phone [phone]  Home Phone Not on file.    [name] NUMBER: [phone]  Best time to call back:   Cell phone:   Other phone:    Available times:    Patient's language of care: English    Patient does not need an interpreter.    Patient's PCP: Hildred Laser, MD    Person calling on behalf of patient: Patient (self)    Calls today Back Pain    Patient's [facility]erred [facility]:   [facility]  Phone: [phone] Fax: [phone]

## 2008-09-11 NOTE — Telephone Encounter (Signed)
Line busy X2

## 2008-09-11 NOTE — Telephone Encounter (Signed)
Staff Message copied by Mylinda Latina on Mon Sep 11, 2008 5:28 PM  ------   Message from: [name]   Created: Mon Sep 11, 2008 4:29 PM   Regarding: med refill flexeril 10 mg pt states was taking up to 2 daily was feeling better and wrenched her back again    [facility]    Fayette Gasner [id number], 53 year old, female, Telephone Information:  Home Phone [phone]  Work Phone [phone]  Home Phone Not on file.      [name] NUMBER: [phone]  Best time to call back: soon  Cell phone:   Other phone:    Available times:    Patient's language of care: English    Patient does not need an interpreter.    Patient's PCP: Hildred Laser, MD    Person calling on behalf of patient: Patient (self)    Calls today for med refill(s). med refill flexeril 10 mg pt states was taking up to 2 daily was feeling better and wrenched her back again    patty    Patient's [facility]erred [facility]:   [facility]  Phone: [phone] Fax: [phone]

## 2008-09-11 NOTE — Telephone Encounter (Signed)
Pt called again, this time requesting refill for flexeril.  Stated that it helped her back problems before.  Will send msg to PCP.

## 2008-09-12 ENCOUNTER — Telehealth (HOSPITAL_BASED_OUTPATIENT_CLINIC_OR_DEPARTMENT_OTHER): Payer: Self-pay | Admitting: Internal Medicine

## 2008-09-12 NOTE — Telephone Encounter (Signed)
Called pt at only callback number and currently number not inservice.

## 2008-09-12 NOTE — Telephone Encounter (Signed)
Spoke with pt.  Verified pt's name and DOB. Pt never picked up her flexeril.. Advised her was sent to the pharmacy last PM and should be ready for p/u.  Advised her to call if no releif with medication.  Pt agrees.

## 2008-09-12 NOTE — Telephone Encounter (Signed)
Staff Message copied by Earney Navy on Tue Sep 12, 2008 11:36 AM  ------   Message from: Hilliard Clark A.   Created: Tue Sep 12, 2008 11:31 AM   Regarding: FW: Back Pain   Contact: [phone]    Patient called states the correct # is [phone]    ----- Message -----   From: Shon Hale A. Yancey Flemings   Sent: Sep 12, 2008 10:56 AM   To: Larene Pickett  Subject: Back Pain     [facility]    Jahnasia Tatum [id number], 53 year old, female, Telephone Information:  Home Phone [phone]  Work Phone [phone]  Home Phone Not on file.    [name] NUMBER: [phone]  Best time to call back:   Cell phone:   Other phone:    Available times:    Patient's language of care: English    Patient does not need an interpreter.    Patient's PCP: Hildred Laser, MD    Person calling on behalf of patient: Patient (self)    Calls today Back Pain    Patient's [facility]  Phone: [phone] Fax: [phone]

## 2008-09-12 NOTE — Telephone Encounter (Signed)
Staff Message copied by Earney Navy on Tue Sep 12, 2008 11:12 AM  ------   Message from: Hilliard Clark A.   Created: Tue Sep 12, 2008 10:56 AM   Regarding: Back Pain   Contact: [phone]    [facility]    Cheryl Dixon [id number], 53 year old, female, Telephone Information:  Home Phone [phone]  Work Phone [phone]  Home Phone Not on file.    Cheryl Dixon NUMBER: [phone]  Best time to call back:   Cell phone:   Other phone:    Available times:    Patient's language of care: English    Patient does not need an interpreter.    Patient's PCP: Hildred Laser, MD    Person calling on behalf of patient: Patient (self)    Calls today Back Pain    Patient's [facility]erred [facility]  Phone: [phone] Fax: [phone]

## 2008-10-11 ENCOUNTER — Encounter: Admission: RE | Admit: 2008-10-11 | Discharge: 2008-10-11 | Payer: Self-pay | Admitting: Family Medicine

## 2008-10-24 ENCOUNTER — Encounter (HOSPITAL_BASED_OUTPATIENT_CLINIC_OR_DEPARTMENT_OTHER): Payer: Self-pay | Admitting: Clinical

## 2008-11-09 ENCOUNTER — Encounter (HOSPITAL_BASED_OUTPATIENT_CLINIC_OR_DEPARTMENT_OTHER): Payer: Self-pay

## 2008-11-09 ENCOUNTER — Emergency Department (HOSPITAL_BASED_OUTPATIENT_CLINIC_OR_DEPARTMENT_OTHER)
Admission: RE | Admit: 2008-11-09 | Payer: Self-pay | Source: Emergency Department | Attending: Emergency Medicine | Admitting: Emergency Medicine

## 2008-11-09 HISTORY — DX: Anxiety disorder, unspecified: F41.9

## 2008-11-09 NOTE — Discharge Instructions (Signed)
Follow up with your doctor

## 2008-11-09 NOTE — ED Notes (Signed)
Pt states "I have right leg varicose veins & discoloration & Pain 8/10, I also have numbness and I have had it for 10 days.

## 2008-11-14 ENCOUNTER — Other Ambulatory Visit (HOSPITAL_BASED_OUTPATIENT_CLINIC_OR_DEPARTMENT_OTHER): Payer: Self-pay | Admitting: Ambulatory Care

## 2008-11-14 DIAGNOSIS — G5600 Carpal tunnel syndrome, unspecified upper limb: Secondary | ICD-10-CM

## 2008-11-14 DIAGNOSIS — M5430 Sciatica, unspecified side: Secondary | ICD-10-CM

## 2008-11-14 DIAGNOSIS — M25569 Pain in unspecified knee: Secondary | ICD-10-CM

## 2008-11-14 MED ORDER — IBUPROFEN 600 MG PO TABS
ORAL_TABLET | ORAL | Status: DC
Start: 2008-11-14 — End: 2008-11-15

## 2008-11-14 NOTE — Telephone Encounter (Signed)
Message [facility]    Person calling on behalf of patient: Patient (self)    May list multiple medications in this section  Medicine Name: IBUPROFEN  Dosage:   Frequency (how many pills, how many times a day):   Number of pills left:     Documented patient preferred pharmacies:  [facility]  Phone: [phone] Fax: [phone]    [name] NUMBER: [phone]  Cell phone:   Other phone:    Available times:    Patient's language of care: English    Patient does not need an interpreter.

## 2008-11-15 NOTE — Telephone Encounter (Signed)
Akera Snowberger [id number], 53 year old, female, Telephone Information:  Home Phone [phone]  Work Phone Not on file.  Home Phone Not on file.      [name] NUMBER: [phone]  Best time to call back:   Cell phone:   Other phone:    Available times:    Patient's language of care: English    Patient does not need an interpreter.    Patient's PCP: Hildred Laser, MD    Person calling on behalf of patient: Patient (self)    Original prescription went to [facility] updated [facility] :IBUPROFEN 600 MG OR TABS Order #: [id number]   Calls today for med refill(s).    Patient's Preferred Pharmacy:   [facility]  Phone: [phone] Fax: [phone]

## 2008-11-15 NOTE — Telephone Encounter (Signed)
Prescription sent yesterday went to [facility] pharmacy-[facility]. Forwarded to provider.

## 2008-11-16 MED ORDER — IBUPROFEN 600 MG PO TABS
ORAL_TABLET | ORAL | Status: AC
Start: 2008-11-15 — End: 2009-01-15

## 2008-11-17 LAB — EMERGENCY ROOM NOTE

## 2009-01-26 ENCOUNTER — Other Ambulatory Visit (HOSPITAL_BASED_OUTPATIENT_CLINIC_OR_DEPARTMENT_OTHER): Payer: Self-pay

## 2009-01-26 ENCOUNTER — Telehealth (HOSPITAL_BASED_OUTPATIENT_CLINIC_OR_DEPARTMENT_OTHER): Payer: Self-pay

## 2009-01-26 NOTE — Progress Notes (Signed)
.  OBOT  Outreach call to see how pt is doing  Methadone is usually long acting but today she was denied her dose at the clinic as "her eyes did not look right"  Pt expressed a desire to go to a detox  This writer left her a vm suggesting she call me if she wants to discuss options  There are inpatient detoxes for methadone but usually the facility likes people to be on 20 mg or less of methadone       Plan   Await call back

## 2009-01-26 NOTE — Telephone Encounter (Signed)
Called patient back, she went to methadone clinic this am, was denied her dose due to her "eyes looking funny". Patient denies any substance use other than her prescribed meds. She is upset, embarassed that she even goes to methadone clinic, is interested in detox.  Denies any issue about her eyes, no drainage, no itching or redness. Patient understands she cannot get any medication from clinic, she will return to methadone clinic tomorrow. i advised her I would let provider know and also send messag to Aida Puffer to discuss detox options with patient. Patient also plans to contact her psychiatrist today.

## 2009-01-26 NOTE — Telephone Encounter (Signed)
Message copied by Emelia Loron on Fri Jan 26, 2009  9:13 AM  ------       Message from: [name]       Created: Fri Jan 26, 2009  8:31 AM       Regarding: SICK:  eye  infection          [facility]              Ilah Boule [id number], 53 year old, female, Telephone Information:       Home Phone      [phone]       Work Phone      Not on file.       Home Phone      Not on file.                     [name] NUMBER:    [phone]              Best time to call back:        Cell phone:        Other phone:              Available times:              Patient's language of care: English              Patient does not need an interpreter.              Patient's PCP: Hildred Laser, MD              Person calling on behalf of patient: Patient (self)              Methadone clinic -took pressure and vitals they made a comment about her eyes.       She did not rec her methadone dose and now is nervous because she will not have anything for tonight.                SICK:  eye  infection        Calls today with a sick call.              Patient's Preferred Pharmacy:        [facility]              Phone: [phone] Fax: [phone]

## 2009-01-29 ENCOUNTER — Telehealth (HOSPITAL_BASED_OUTPATIENT_CLINIC_OR_DEPARTMENT_OTHER): Payer: Self-pay

## 2009-01-29 NOTE — Telephone Encounter (Addendum)
2nd attempt   OBOT:   Left her a detailed message asking her to leave specific questions that I may answer as we are in the process of playing telephone tag and this writer is sure she is feeling frustruated by the lack of connection - person to person

## 2009-01-29 NOTE — Telephone Encounter (Signed)
OBOT  Pt was not home today   Requested a callback to talk about treatment options  Pt is on methadone   Will discuss if she calls back

## 2009-01-30 ENCOUNTER — Other Ambulatory Visit (HOSPITAL_BASED_OUTPATIENT_CLINIC_OR_DEPARTMENT_OTHER): Payer: Self-pay

## 2009-01-30 NOTE — Progress Notes (Signed)
.  OBOT  Pt is fed up with methadone maintenance  We had a long talk as she is on 95mg  daily still after 5 years  At one time she had 5 take homes which is excellent but smoked pot and lost them all  She is currently dealing with grief of losing her mom in august    Was denied her dose Friday 01/26/09 because she states the nurses at the clinic stated she looked sleepy  Discussion centered on a future plan to get off methadone  Pt is going to try tapering until she really feels withdrawal ? Maybe 5 mgs weekly until she is down to 50   Then she can slow it down for a couple of weeks  See how she is doing and then taper again   It is far better to taper slowly     Pt understands this and is willing to try    Plan   Keep in contact   Obey the rules at the clinic for now

## 2009-02-21 ENCOUNTER — Ambulatory Visit (HOSPITAL_BASED_OUTPATIENT_CLINIC_OR_DEPARTMENT_OTHER): Payer: MEDICARE | Admitting: Internal Medicine

## 2009-02-21 ENCOUNTER — Encounter (HOSPITAL_BASED_OUTPATIENT_CLINIC_OR_DEPARTMENT_OTHER): Payer: Self-pay | Admitting: Internal Medicine

## 2009-03-24 ENCOUNTER — Emergency Department (HOSPITAL_BASED_OUTPATIENT_CLINIC_OR_DEPARTMENT_OTHER)
Admission: RE | Admit: 2009-03-24 | Disposition: A | Discharge status: Home / Self Care | Payer: Self-pay | Source: Emergency Department | Attending: Emergency Medicine | Admitting: Emergency Medicine

## 2009-03-24 ENCOUNTER — Encounter (HOSPITAL_BASED_OUTPATIENT_CLINIC_OR_DEPARTMENT_OTHER): Payer: Self-pay

## 2009-03-24 LAB — URINALYSIS
BILIRUBIN, URINE: NEGATIVE
CASTS: NONE SEEN PER LPF
CRYSTALS: NONE SEEN
GLUCOSE, URINE: NEGATIVE MG/DL
KETONE, URINE: NEGATIVE MG/DL
LEUKOCYTE ESTERASE: NEGATIVE
NITRITE, URINE: NEGATIVE
PH URINE: 7.5 (ref 5.0–8.0)
PROTEIN, URINE: NEGATIVE MG/DL
SPECIFIC GRAVITY URINE: 1.01 (ref 1.003–1.035)

## 2009-03-24 LAB — COMPREHENSIVE METABOLIC PANEL
ALANINE AMINOTRANSFERASE: 13 IU/L (ref 7–35)
ALBUMIN: 4.6 g/dl (ref 3.4–4.8)
ALKALINE PHOSPHATASE: 59 IU/L (ref 25–106)
ANION GAP: 9 mmol/L (ref 2–25)
ASPARTATE AMINOTRANSFERASE: 23 IU/L (ref 8–34)
BILIRUBIN TOTAL: 1.2 mg/dl — ABNORMAL HIGH (ref 0.2–1.1)
BUN (UREA NITROGEN): 15 mg/dl (ref 6–20)
CALCIUM: 9.9 mg/dl (ref 8.6–10.3)
CARBON DIOXIDE: 27 mmol/L (ref 22–32)
CHLORIDE: 91 mmol/L — ABNORMAL LOW (ref 101–111)
CREATININE: 0.9 mg/dl (ref 0.4–1.2)
ESTIMATED GLOMERULAR FILT RATE: >60 mL/min (ref >60)
Glucose Random: 88 mg/dl (ref 74–160)
POTASSIUM: 4.4 mmol/L (ref 3.5–5.1)
SODIUM: 127 mmol/L — ABNORMAL LOW (ref 135–144)
TOTAL PROTEIN: 7.1 g/dl (ref 5.9–7.5)

## 2009-03-24 LAB — HOLD GREEN TOP TUBE

## 2009-03-24 LAB — CBC, PLATELET & DIFFERENTIAL
BASOPHIL %: 0.5 % (ref 0.0–2.0)
EOSINOPHIL %: 4.8 % (ref 0.0–7.0)
HEMATOCRIT: 43.9 % (ref 36.0–48.0)
HEMOGLOBIN: 14.6 g/dl (ref 12.0–16.0)
LYMPHOCYTE %: 30.7 % (ref 13.0–39.0)
MEAN CORP HGB CONC: 33.1 g/dl (ref 32.0–36.0)
MEAN CORPUSCULAR HGB: 28.8 pg (ref 27.0–33.0)
MEAN CORPUSCULAR VOL: 86.8 fl (ref 80.0–100.0)
MEAN PLATELET VOLUME: 7.6 fl (ref 6.4–10.8)
MONOCYTE %: 7.2 % (ref 1.0–12.0)
NEUTROPHIL %: 56.8 % (ref 46.0–79.0)
PLATELET COUNT: 261 10*3/uL (ref 150–400)
RBC DISTRIBUTION WIDTH: 13.7 % (ref 11.5–14.3)
RED BLOOD CELL COUNT: 5.06 M/uL (ref 4.50–5.10)
WHITE BLOOD CELL COUNT: 8 10*3/uL (ref 4.0–10.8)

## 2009-03-24 LAB — HOLD BLUE TOP TUBE

## 2009-03-24 LAB — AMYLASE: AMYLASE: 137 U/L — ABNORMAL HIGH (ref 15–133)

## 2009-03-24 LAB — LIPASE: LIPASE: 16 U/L (ref 10–50)

## 2009-03-24 MED ORDER — METOCLOPRAMIDE HCL 5 MG PO TABS
ORAL_TABLET | ORAL | Status: AC
Start: 2009-03-24 — End: 2009-03-27

## 2009-03-24 NOTE — ED Notes (Signed)
3 weeks hx abdominal pain, nausea, vomiting, worst in past 2 days. Pt states that recently her PCP decreased her Methadone dosage from 98 mg to 87 mg, " I think that is why I have been sick"

## 2009-03-25 LAB — CT PELVIS W CONTRAST

## 2009-03-25 LAB — CT ABDOMEN W IV CONTRAST

## 2009-03-27 LAB — EMERGENCY ROOM NOTE

## 2009-04-12 LAB — EMERGENCY ROOM NOTE

## 2009-04-24 ENCOUNTER — Telehealth (HOSPITAL_BASED_OUTPATIENT_CLINIC_OR_DEPARTMENT_OTHER): Payer: Self-pay

## 2009-04-24 NOTE — Telephone Encounter (Signed)
Message copied by Molinda Bailiff on Tue Apr 24, 2009  9:13 AM  ------       Message from: Kennyth Lose       Created: Tue Apr 24, 2009  9:09 AM       Regarding: urinary problem         [facility]              Soriya Worster [id number], 53 year old, female, Telephone Information:       Home Phone      [phone]       Work Phone      [phone]              [name] NUMBER: [phone]              Patient's language of care: English              Patient does not need an interpreter.              Patient's PCP: Hildred Laser, MD              Person calling on behalf of patient: Patient (self)              Calls today with a sick call.  Urinary problem for 8 days, went to [facility] ed.  Not feeling better.              Patient's Preferred Pharmacy:        [facility]              Phone: [phone] Fax: [phone]

## 2009-04-24 NOTE — Telephone Encounter (Signed)
Spoke with pt who was diagnosed with UTI 1 week ago when she went to [facility] clinic and took augmentin for 1 week. Pt states that she continues to experience abdominal cramping and difficulty urinating at times. Offered appt for today, pt unable to come in, scheduled for tomorrow with Dr.Carman.  Instructed pt to push fluids, which she states that she is.

## 2009-04-25 ENCOUNTER — Ambulatory Visit (HOSPITAL_BASED_OUTPATIENT_CLINIC_OR_DEPARTMENT_OTHER): Payer: MEDICARE | Admitting: Internal Medicine

## 2009-04-25 VITALS — BP 110/80 | HR 74 | Temp 99.4°F | Wt 194.2 lb

## 2009-04-25 DIAGNOSIS — Z1211 Encounter for screening for malignant neoplasm of colon: Secondary | ICD-10-CM

## 2009-04-25 DIAGNOSIS — F1121 Opioid dependence, in remission: Secondary | ICD-10-CM

## 2009-04-25 DIAGNOSIS — M549 Dorsalgia, unspecified: Secondary | ICD-10-CM

## 2009-04-25 DIAGNOSIS — N390 Urinary tract infection, site not specified: Secondary | ICD-10-CM

## 2009-04-25 DIAGNOSIS — N63 Unspecified lump in unspecified breast: Secondary | ICD-10-CM

## 2009-04-25 LAB — URINE DIP (POINT OF CARE)
BILIRUBIN, URINE: NEGATIVE (ref 0–0)
GLUCOSE, URINE: NEGATIVE mg/dl (ref 0–0)
KETONE, URINE: NEGATIVE mg/dl (ref 0–0)
LEUKOCYTE ESTERASE: NEGATIVE (ref 0–0)
NITRITE, URINE: NEGATIVE
PH URINE: 5.5 (ref 5.0–8.0)
PROTEIN, URINE: NEGATIVE mg/dl (ref 0–15)
SPECIFIC GRAVITY URINE: 1.015 (ref 1.003–1.030)
UROBILINOGEN URINE: 0.2 mg/dl (ref 0.2–1.0)

## 2009-04-25 MED ORDER — NAPROXEN DR 375 MG PO TBEC
DELAYED_RELEASE_TABLET | ORAL | Status: DC
Start: 2009-04-25 — End: 2009-07-02

## 2009-04-25 MED ORDER — NITROFURANTOIN MONOHYD MACRO 100 MG PO CAPS
ORAL_CAPSULE | ORAL | Status: AC
Start: 2009-04-25 — End: 2009-05-02

## 2009-04-25 NOTE — Progress Notes (Signed)
[name] is a 53 year old female      Pt comes in with variety of medical problems she wishes to discuss  Has not been seen since April when she was seen for urgent care  Last apt with pcp was about 1 year ago    Concerns today include:  UTI sx  urinary hesitancy, low grade temp, stomach cramping and left kidney pain; she will have severe urge to urinate but then sits on toilet for 15 minutes before any urine comes out; she has hx of UTIs in the past; no dysuria; drinks a lot of fluid and so does urinate frequently but not more frequently that normal.  These sx started 2 weeks ago;  She was treated 1 week ago at [facility] ER for UTI- treated with augmentin; her sx improved for 1 day and then resumed;      She is sp TAH BSO for endometriosis    Back pain  Cannot tolerate motrin due to GI upset  She would like to try naproxen as she seems to tolerate this better  Pain across lower back and comes and goes  This is different than her "kidney pain"  No radiation of this pain  No numbness/weakness in legs    Breast lump  Not sure how long present but is new in past few months  Sister has hx of breast calcifications  Aunt possibly had breast cancer  She checks her breasts all the time due to fam hx  No breast pain  Located left breast below nipple  Last screening mammo 10 months ago was nl    Exam  BP 110/80   Pulse 74   Temp(Src) 99.4 F (37.4 C) (Oral)   Wt 194 lb 3.2 oz (88.089 kg)   SpO2 97%  Gen: alert, nad, difficult to direct, confusing historian  Heart:S1 and S2 normal, no murmurs, clicks, gallops or rubs. Regular rate and rhythm.   Lungs:Chest is clear, no wheezing or rales. Normal symmetric air entry throughout both lung fields.  Back: tender paravertebral muscles in lumbar region;  NT spine  No CVA tenderness  Abd: soft, ND, mild tenderness bilateral lower abdomen and suprapubic region  Breast: .5 cm nodule located at [facility] on left breast; no other nodules/masses, no skin changes and no LAD  Right  breast normal with no masses    A/P      599.0U UTI (lower urinary tract infection)  Comment: urinary sx are not entirely typical for UTI and UA not really typical either  But pt quite insistent that current sx are typical for her UTIs  Will treat with macrobid and send cx  If cx negative would refer urology for eval  Plan: URINE CULTURE/COLONY COUNT        611.72H Breast lump  Comment: refer breast center for eval  Plan: REFERRAL TO BREAST [facility]ENTER - [facility] (INT)       724.5E Back pain  Comment: trial of naproxen for mechanical pain    V76.3F Screening for colon cancer  Comment:   Plan: FECAL OCCULT (POINT OF CARE) HOME TEST 3 CARDS      304.03 Opioid type dependence, in remission  (primary encounter diagnosis)  Comment: update med list  Plan: methadone (DOLOPHINE) 10 MG/ML solution      hcm  Schedule PE with pcp      25 minutes spent with patient in direct patient care

## 2009-04-26 LAB — URINE CULTURE/COLONY COUNT: URINE CULTURE/COLONY COUNT: NO GROWTH

## 2009-05-01 ENCOUNTER — Telehealth (HOSPITAL_BASED_OUTPATIENT_CLINIC_OR_DEPARTMENT_OTHER): Payer: Self-pay | Admitting: Internal Medicine

## 2009-05-01 NOTE — Progress Notes (Addendum)
Quick Note:    Call to pt  Her urine cx was negative  She reports that her urinary sx improved in 2 days  She figured out that doxepin was the culprit for her urinary retention  She stopped this medication  No urinary sx any more  No need for urology referral  ______

## 2009-05-01 NOTE — Telephone Encounter (Signed)
Call to pt  Her urine cx was negative  She reports that her urinary sx improved in 2 days  She figured out that doxepin was the culprit for her urinary retention  She stopped this medication

## 2009-05-02 ENCOUNTER — Ambulatory Visit (HOSPITAL_BASED_OUTPATIENT_CLINIC_OR_DEPARTMENT_OTHER): Payer: MEDICARE | Admitting: Surgery

## 2009-05-16 ENCOUNTER — Ambulatory Visit (HOSPITAL_BASED_OUTPATIENT_CLINIC_OR_DEPARTMENT_OTHER): Payer: MEDICARE | Admitting: Surgery

## 2009-05-23 ENCOUNTER — Ambulatory Visit (HOSPITAL_BASED_OUTPATIENT_CLINIC_OR_DEPARTMENT_OTHER): Payer: MEDICARE | Admitting: Surgery

## 2009-05-23 ENCOUNTER — Encounter (HOSPITAL_BASED_OUTPATIENT_CLINIC_OR_DEPARTMENT_OTHER): Payer: Self-pay | Admitting: Internal Medicine

## 2009-05-23 DIAGNOSIS — N6019 Diffuse cystic mastopathy of unspecified breast: Secondary | ICD-10-CM | POA: Insufficient documentation

## 2009-05-23 DIAGNOSIS — N63 Unspecified lump in unspecified breast: Secondary | ICD-10-CM

## 2009-05-23 NOTE — Progress Notes (Signed)
CC: left breast mass    HPI: Cheryl Dixon is a 54yo woman with a PMH: anxiety and opioid dependence in remission seen at the request of Dr. Ernestine Conrad for evaluation of L breast mass.  Patient  first noted a mass on SBE/in the shower  December; was seen by Dr. Ernestine Conrad for another reason on 04/25/2009 but mentioned the lump and was found on exam to have : .5 cm nodule located at [facility] on left breast; no other nodules/masses .  Mass is not tender/mobile/firm/unchanged but now she can detect 4-5 other masses.  Breasts negative for  pain, discharge, bleeding, skin changes, inappropriate or swelling. She was sent for screening imaging 05/2008 which revealed There are no previous exams for comparison. The breasts  demonstrate scattered fibroglandular tissue.  There is no radiologic  evidence of malignancy.    REPRODUCTIVE HX: menarche 54yo; hysterectomy 1993 ovaries removed separate surg for endometriosis 1997; OCP 78yrs HRT  50yrs;  Age 23yo at first live birth G5 P3; BF no.    FH:  PA breast CA age 97yo, mat cousin ovarian CA 56yo    PMH: see above  PSH: hsyt/oophrectomy for endometriosis, vein stripping right 2006  MED:   Current outpatient prescriptions ordered prior to encounter:  methadone (DOLOPHINE) 10 MG/ML solution about 80 mg daily from clinic Disp:  Rfl: 0   naproxen (EC-NAPROSYN) 375 MG TBEC 1 TABLET TWICE DAILY Disp: 60 tablet Rfl: 0   KLONOPIN 1 MG OR TABS po 2 x a day Disp:  Rfl:    WELLBUTRIN SR 200 MG OR TB12 po 2 x a day Disp:  Rfl:    DOCUSATE SODIUM 100 MG OR CAPS 2 CAPSULES DAILY Disp: 60 Rfl: 11         ALL: Meperidine Hcl    ROS:  ROS is negative except anemia, chills, leg swelling and pain related to varicosities, arthritits, night sweats, SOB/anxiety    SH: Substance Use: tobacco 1 PPD for 6 yrs; no eoth    ZH:YQMVHQ symmetric without suspicious masses, skin or nipple changes or axillary nodes palpable bilateral, symmetric fibrous changes in both lower inner quadrants, examined in  upright and supine position, nipples normal without inversion, lesions or discharge, no skin dimpling or peau d'orange,no inducible nipple discharge, self-exam is taught and encouraged; the masses indicated by the patient in the 6 oclock to 8 oclock position of the left breast supine are smooth oval compressible shapes c/w nl glandular tissue    LABS: mammogram 05/2008 reviewed by myself today with the patient and I agree with the findings.    IMP: clinical exam nl;  do not recommend biopsy at this time.    PLAN: Recommend schedule routine screening mammogram 05/2009; cont SBE; will f/u if additional w/u needed. Note to Dr. Ernestine Conrad.

## 2009-06-19 ENCOUNTER — Encounter (HOSPITAL_BASED_OUTPATIENT_CLINIC_OR_DEPARTMENT_OTHER): Payer: Self-pay

## 2009-06-19 ENCOUNTER — Emergency Department (HOSPITAL_BASED_OUTPATIENT_CLINIC_OR_DEPARTMENT_OTHER)
Admission: RE | Admit: 2009-06-19 | Disposition: A | Discharge status: Home / Self Care | Payer: Self-pay | Source: Emergency Department | Attending: Emergency Medicine | Admitting: Emergency Medicine

## 2009-06-19 LAB — CBC WITH PLATELET
HEMATOCRIT: 33.8 % — ABNORMAL LOW (ref 36.0–48.0)
HEMOGLOBIN: 11.3 g/dl — ABNORMAL LOW (ref 12.0–16.0)
MEAN CORP HGB CONC: 33.3 g/dl (ref 32.0–36.0)
MEAN CORPUSCULAR HGB: 28.3 pg (ref 27.0–33.0)
MEAN CORPUSCULAR VOL: 85 fl (ref 80.0–100.0)
MEAN PLATELET VOLUME: 8.4 fl (ref 6.4–10.8)
PLATELET COUNT: 250 10*3/uL (ref 150–400)
RBC DISTRIBUTION WIDTH: 13.9 % (ref 11.5–14.3)
RED BLOOD CELL COUNT: 3.98 M/uL — ABNORMAL LOW (ref 4.50–5.10)
WHITE BLOOD CELL COUNT: 16.1 10*3/uL — ABNORMAL HIGH (ref 4.0–10.8)

## 2009-06-19 LAB — MANUAL WBC DIFFERENTIAL
BAND NEUTROPHILS %: 7 % (ref 0–8)
LYMPHOCYTES %: 2 % — CL (ref 13.0–39.0)
METAMYELOCYTES %: 2 % — ABNORMAL HIGH (ref 0–0)
MONOCYTES %: 6 % (ref 1–12)
PLATELET ESTIMATE: NORMAL
POLYMORPHONUCLEAR (SEGS) %: 83 % — ABNORMAL HIGH (ref 46.0–79.0)

## 2009-06-19 LAB — COMPREHENSIVE METABOLIC PANEL
ALANINE AMINOTRANSFERASE: 20 IU/L (ref 7–35)
ALBUMIN: 3 g/dl — ABNORMAL LOW (ref 3.4–4.8)
ALKALINE PHOSPHATASE: 97 IU/L (ref 25–106)
ANION GAP: 8 mmol/L (ref 2–25)
ASPARTATE AMINOTRANSFERASE: 25 IU/L (ref 8–34)
BILIRUBIN TOTAL: 0.4 mg/dl (ref 0.2–1.1)
BUN (UREA NITROGEN): 21 mg/dl — ABNORMAL HIGH (ref 6–20)
CALCIUM: 8.4 mg/dl — ABNORMAL LOW (ref 8.6–10.3)
CARBON DIOXIDE: 26 mmol/L (ref 22–32)
CHLORIDE: 94 mmol/L — ABNORMAL LOW (ref 101–111)
CREATININE: 1.1 mg/dl (ref 0.4–1.2)
ESTIMATED GLOMERULAR FILT RATE: 52 mL/min — ABNORMAL LOW (ref >60)
Glucose Random: 120 mg/dl (ref 74–160)
POTASSIUM: 3.3 mmol/L — ABNORMAL LOW (ref 3.5–5.1)
SODIUM: 128 mmol/L — ABNORMAL LOW (ref 135–144)
TOTAL PROTEIN: 6.1 g/dl (ref 5.9–7.5)

## 2009-06-19 LAB — HOLD BLUE TOP TUBE

## 2009-06-19 LAB — HOLD GREEN TOP TUBE

## 2009-06-19 LAB — XR CHEST 2 VIEWS

## 2009-06-19 LAB — EMERGENCY ROOM NOTE

## 2009-06-19 MED ORDER — ALBUTEROL SULFATE HFA 108 (90 BASE) MCG/ACT IN AERS
2.00 | INHALATION_SPRAY | Freq: Four times a day (QID) | RESPIRATORY_TRACT | Status: AC | PRN
Start: 2009-06-19 — End: 2009-06-25

## 2009-06-19 MED ORDER — BENZONATATE 100 MG PO CAPS
100.0000 mg | ORAL_CAPSULE | Freq: Three times a day (TID) | ORAL | Status: DC | PRN
Start: 2009-06-19 — End: 2009-06-28

## 2009-06-19 MED ORDER — AZITHROMYCIN 250 MG PO TABS
250.00 mg | ORAL_TABLET | Freq: Every day | ORAL | Status: AC
Start: 2009-06-19 — End: 2009-06-23

## 2009-06-19 NOTE — ED Notes (Signed)
PT HAVING COUGH X 2 WEEKS.  DIFFICULTY WITH URINATION AND STATES DRK URINE.  ? BLOOD IN URINE LAST NIGHT ALSO BLOOD IN SPUTUM.  SOB IN TRIAGE DIMINISHED LUNG SOUND ON R .  102.7 FEVER AND TYLENOL GIVEN IN TRIAGE.  MASK GIVEN BY GREETER UPON PRE- REGISTRATION

## 2009-06-19 NOTE — Discharge Instructions (Signed)
Index   Hematuria (Blood in Urine)   What is hematuria?   Hematuria means blood in the urine. Microscopic hematuria means that the blood is seen only when the urine is examined under a microscope. Gross hematuria means that there is enough blood in the urine to be seen without a microscope. It causes the urine to look pink, red, or sometimes brown.   Certain kinds of foods, such as beets or blackberries, may give the urine a reddish tint. This should last only for a day or so after eating these foods. A few medicines may also turn the urine reddish. If you have started a new medicine and notice a color change in your urine, call your pharmacist to see if that is normal. If the redness persists and cannot be explained by food or medicine, consult your healthcare provider promptly.   How does it occur?   Hematuria is a sign that something is causing bleeding in the urinary tract. The urinary tract includes the kidneys, the ureters (tubes that carry urine from the kidneys to the bladder), the bladder, and the urethra (tube that carries urine from the bladder out of the body). Some common causes of blood in the urine are:   · urinary tract (bladder) infection   · strenuous exercise   · kidney disease   · a stone in your bladder or kidney   · an inherited disease such as sickle cell anemia or systemic lupus erythematosus   · medicines such as blood thinners, including heparin (Calciparine, Liquaemin), warfarin (Coumadin), or aspirin-type medicines; penicillins; sulfa-containing drugs; cyclophosphamide (Cytoxan)   · a prostate infection   · injury to any part of the urinary tract (for example, falling off a bike might bruise your kidney)   · a tumor in your urinary tract.   How is it diagnosed?   Urine that has blood in it may appear pink, bright red, or sometimes brown. If you have blood in your urine, your healthcare provider will ask about other symptoms and examine you. If the cause is obvious, your healthcare  provider will treat you. If the cause isn't clear, you may need to have more tests such as:   · urine tests   · blood tests   · ultrasound scan of your bladder and kidneys   · intravenous pyelogram (an X-ray of the urinary tract)   · cystoscopy (a procedure that allows your provider to look at the urinary tract with a slim, flexible, lighted tube inserted through the urethra).   How is it treated?   The treatment of hematuria depends on its cause.   How long do the effects last?   How long hematuria lasts depends on its cause. For example, hematuria related to strenuous exercise usually goes away within 1 or 2 days after the exercise. Hematuria from a urinary tract infection will end when the infection is cured. Other causes might take longer to clear up.   What can I do to help prevent hematuria?   Prevention of hematuria depends on the cause. Ask your healthcare provider what you can do to prevent it.   Developed by [employer].   Published by [employer].   Last modified: 2005-01-18   Last reviewed: 2006-08-28   This content is reviewed periodically and is subject to change as new health information becomes available. The information is intended to inform and educate and is not a replacement for medical evaluation, advice, diagnosis or treatment by a healthcare professional.     Adult Health Advisor 2010.1 Index   2010 [employer] and/or its affiliates. All Rights Reserved.   Pneumonia     Pneumonia is an infection of the lungs which may be viral or bacterial (with germs). Most forms of infectious (disease producing) pneumonias are bacterial. This means they are caused by a germ. Usually these infections are caused by breathing in infectious particles into the lungs (respiratory tract).     SYMPTOMS   The most common problems (symptoms) are:    Cough.   Fever.  Chest pain.   Increased rate of breathing.  Wheezing.   Sputum production.        DIAGNOSIS   Often these infections are diagnosed on exam by your  caregiver. Sometimes the diagnosis may require chest x-rays, blood analysis, and or cultures.   Your caregiver may do tests (such as blood gasses or pulse oximetry) to see how well your lungs are working.   Blood cultures may be done to help find the cause of your pneumonia.     TREATMENT  The bacterial pneumonias generally respond well to antibiotics (medications which kill germs). Viral infections must run their course. These infections will not respond to antibiotics. A pneumococcal vaccine (shot) is available to prevent a common bacterial pneumonia. This is usually suggested for the elderly and for other groups of higher risk individuals, such as those on chemotherapy or those that have problems with their immune system.   You will have pneumococcal screening or vaccination if you are over 63 years old and are not immunized.      If you are a smoker, it is time to quit. You will receive instructions on how to best stop smoking. Your caregivers can provide medications and counseling to help you quit.     HOME CARE INSTRUCTIONS   Cough suppressants may be used if you are losing too much rest; however cough protects you by clearing your lungs. This is one reason for not using cough suppressants, if able, as they take away this protection.   Your caregiver may have prescribed an antibiotic if she or he feels your cough is caused by a bacterial infection. Take all your medication until you are finished.   Your caregiver may also prescribe an expectorant to loosen the mucus to be coughed up.   Only take over-the-counter or prescription medicines for pain, discomfort, or fever as directed by your caregiver.   Smoking is a common cause of bronchitis and can contribute to pneumonia. Stopping this habit is an important self help step.   A cold steam vaporizer or humidifier in your room or home may help loosen mucus.   Cough is most often worse at night. Sleeping in a semi-upright position in a recliner, or  using a couple pillows under your head will help with this.   Get rest as you feel it is needed. Your body will usually let you know when to rest.   If you are a smoker and continue to smoke, your cough may last several weeks after your pneumonia has cleared.     SEEK IMMEDIATE MEDICAL CARE IF:   You develop more purulent (pus like) sputum, have uncontrolled temperature, or your illness becomes worse. This is especially true if you are elderly or weakened from any other disease.   You cannot control your cough with suppressants and are losing sleep.   You begin coughing up blood.   You develop pain which is getting worse or is uncontrolled with medications.  You develop an oral temperature above 102 F (38.9 C), after being afebrile (not having a temperature for one or more days).   Any of the symptoms which initially brought you in for treatment are getting worse rather than better, or you develop shortness of breath or chest pain. Dial your local emergency service (911 in the [place] for immediate emergency care.     MAKE SURE YOU:    Understand these instructions.    Will watch your condition.   Will get help right away if you are not doing well or get worse.     Document Released: 04/14/2005  Document Re-Released: 07/11/2008  [facility] Patient Information 2010 [place], [place].

## 2009-06-28 ENCOUNTER — Ambulatory Visit (HOSPITAL_BASED_OUTPATIENT_CLINIC_OR_DEPARTMENT_OTHER): Payer: MEDICARE | Admitting: Internal Medicine

## 2009-06-28 VITALS — BP 120/80 | HR 86 | Temp 97.6°F | Wt 197.0 lb

## 2009-06-28 DIAGNOSIS — IMO0001 Reserved for inherently not codable concepts without codable children: Secondary | ICD-10-CM

## 2009-06-28 DIAGNOSIS — F17200 Nicotine dependence, unspecified, uncomplicated: Secondary | ICD-10-CM

## 2009-06-28 DIAGNOSIS — R339 Retention of urine, unspecified: Secondary | ICD-10-CM

## 2009-06-28 DIAGNOSIS — J189 Pneumonia, unspecified organism: Secondary | ICD-10-CM

## 2009-06-28 DIAGNOSIS — R319 Hematuria, unspecified: Secondary | ICD-10-CM

## 2009-06-28 LAB — URINE DIP (POINT OF CARE)
BILIRUBIN, URINE: NEGATIVE (ref 0–0)
GLUCOSE, URINE: NEGATIVE mg/dl (ref 0–0)
KETONE, URINE: NEGATIVE mg/dl (ref 0–0)
LEUKOCYTE ESTERASE: NEGATIVE (ref 0–0)
NITRITE, URINE: NEGATIVE
PH URINE: 5.5 (ref 5.0–8.0)
PROTEIN, URINE: NEGATIVE mg/dl (ref 0–15)
SPECIFIC GRAVITY URINE: 1.005 (ref 1.003–1.030)
UROBILINOGEN URINE: 0.2 mg/dl (ref 0.2–1.0)

## 2009-06-28 MED ORDER — NICOTINE 21 MG/24HR TD PT24
1.0000 | MEDICATED_PATCH | Freq: Every day | TRANSDERMAL | Status: DC
Start: 2009-06-28 — End: 2009-07-02

## 2009-06-28 MED ORDER — NICOTINE 14 MG/24HR TD PT24
1.00 | MEDICATED_PATCH | Freq: Every day | TRANSDERMAL | Status: AC
Start: 2009-08-09 — End: 2009-08-23

## 2009-06-28 MED ORDER — BENZONATATE 100 MG PO CAPS
100.0000 mg | ORAL_CAPSULE | Freq: Three times a day (TID) | ORAL | Status: DC | PRN
Start: 2009-06-28 — End: 2009-07-02

## 2009-06-28 MED ORDER — NICOTINE 7 MG/24HR TD PT24
1.00 | MEDICATED_PATCH | Freq: Every day | TRANSDERMAL | Status: AC
Start: 2009-08-23 — End: 2009-09-06

## 2009-06-28 NOTE — Progress Notes (Signed)
54 year old female seen in follow up:    Had pneumonia recently  Seen in ER  Treated with azithromycin    Had humaturia at the same time  Has been treated for "UTIs" several times in the last year  However the urine cultures have been negative  Symptoms have been difficulty starting urinating and cramping in the stomach and urgency  No frequency  No incontinence  No pelvic exam x years as had hysterectomy    Since pneumonia  Still feeling shaky  Headache  But also tapering methadone  Down to 79 mg  Stopped smoking for two weeks      BP 120/80   Pulse 86   Temp(Src) 97.6 F (36.4 C) (Oral)   Wt 197 lb (89.359 kg)   SpO2 98%  regular rate and rhythm  Lungs clear  The abdomen is soft without tenderness, guarding, mass, rebound or organomegaly. Bowel sounds are normal. No CVA tenderness or inguinal adenopathy noted.   Neuro nonfocal    Assessment and plan:  Urinary retention and urgency  Declines pelvic exam today   - referred to urology ? cystocele    Hematuria  Single episode  - recheck UA; if positive will ask urology to evaluate this as well    Pneumonia   Resolving    Smoking.  Action phase  - nicotine patch  - The patient was educated regarding the risks, benefits, and potential side effects of new medication.    Opiate dependence  Gradually tapering methadone

## 2009-07-02 ENCOUNTER — Other Ambulatory Visit (HOSPITAL_BASED_OUTPATIENT_CLINIC_OR_DEPARTMENT_OTHER): Payer: Self-pay | Admitting: Ambulatory Care

## 2009-07-02 DIAGNOSIS — M549 Dorsalgia, unspecified: Secondary | ICD-10-CM

## 2009-07-02 DIAGNOSIS — J189 Pneumonia, unspecified organism: Secondary | ICD-10-CM

## 2009-07-02 DIAGNOSIS — F17200 Nicotine dependence, unspecified, uncomplicated: Secondary | ICD-10-CM

## 2009-07-02 DIAGNOSIS — IMO0001 Reserved for inherently not codable concepts without codable children: Secondary | ICD-10-CM

## 2009-07-02 MED ORDER — BENZONATATE 100 MG PO CAPS
100.0000 mg | ORAL_CAPSULE | Freq: Three times a day (TID) | ORAL | Status: AC | PRN
Start: 2009-07-02 — End: 2009-08-02

## 2009-07-02 MED ORDER — NAPROXEN DR 375 MG PO TBEC
375.0000 mg | DELAYED_RELEASE_TABLET | Freq: Two times a day (BID) | ORAL | Status: DC
Start: 2009-07-02 — End: 2009-11-10

## 2009-07-02 MED ORDER — NICOTINE 21 MG/24HR TD PT24
1.0000 | MEDICATED_PATCH | Freq: Every day | TRANSDERMAL | Status: AC
Start: 2009-07-02 — End: 2009-08-13

## 2009-07-02 NOTE — Telephone Encounter (Signed)
Signed prescription placed in out-box to be faxed.

## 2009-07-02 NOTE — Telephone Encounter (Signed)
Message [facility]    Cheryl Dixon [id number], 54 year old, female, Telephone Information:  Home Phone   [phone]  Work Phone   [phone]  Mobile     Not on file.  Home Phone   Not on file.      [name] NUMBER: [phone]    Best time to call back:   Cell phone:   Other phone:    Available times:    Patient's language of care: English    Patient does not need an interpreter.    Patient's PCP: Hildred Laser, MD    Person calling on behalf of patient: Patient (self)    [name] lost her prescriptions in his back pack   She forgot to ask when she was here for naproxen  naproxen (EC-NAPROSYN) 375 MG TBEC 60 tablet 0 04/25/2009 05/26/2009 --   Sig - Route: 1 TABLET TWICE DAILY - Oral   Class: E-Prescribe   2 that are lost   benzonatate (TESSALON PERLES) 100 MG capsule 30 capsule 0 06/28/2009 07/28/2009 --   Sig - Route: Take 1-2 capsules by mouth every 8 (eight) hours as needed for Cough. for cough - Oral   Class: Tamperproof   nicotine (NICODERM CQ) 21 MG/24HR 42 patch 0 06/28/2009 08/09/2009 --   Sig - Route: Place 1 patch onto the skin daily. - Transdermal   Class: Tamperproof   Calls today for med refill(s).    Patient's Preferred Pharmacy:   [facility]    Phone: [phone] Fax: [phone]

## 2009-07-03 ENCOUNTER — Telehealth (HOSPITAL_BASED_OUTPATIENT_CLINIC_OR_DEPARTMENT_OTHER): Payer: Self-pay | Admitting: Internal Medicine

## 2009-07-03 NOTE — Telephone Encounter (Signed)
Left patient a voice msg regarding Urology appt. On Oct 01, 2009 at 9:00am at the [facility] hospital with [facility]

## 2009-07-09 ENCOUNTER — Telehealth (HOSPITAL_BASED_OUTPATIENT_CLINIC_OR_DEPARTMENT_OTHER): Payer: Self-pay

## 2009-07-09 ENCOUNTER — Ambulatory Visit (HOSPITAL_BASED_OUTPATIENT_CLINIC_OR_DEPARTMENT_OTHER): Payer: MEDICARE | Admitting: Urology

## 2009-07-09 NOTE — Telephone Encounter (Signed)
Called patient back, she missed an appointment today with Dr. Melburn Hake due to court date, when she called to re-schedule she was advised next available was 4/15. She is concerned, she reports since her antibiotics have stopped she is starting to have the symptoms of difficulty voiding return. Patient reports if she needs to go to another provider/location, she needs to arrange the ride. Advised her I would forward message to provider for direction.

## 2009-07-09 NOTE — Telephone Encounter (Signed)
Message copied by Emelia Loron on Mon Jul 09, 2009  1:22 PM  ------       Message from: Sharyne Richters       Created: Mon Jul 09, 2009 12:42 PM       Regarding: [place] reschedule needed       Contact: [phone]         Pt called she missed an appt today due to court appearance fo [place]       Didn't have time to call and needs this procedure done asap              She needs a sooner appt thn April 15th that was offered by the scheduling office.              [place] appt needs to be reschedule due to cramping.        Can they set up MD to MD              She has to hook with the Ride.        Can not attend today.

## 2009-07-09 NOTE — Telephone Encounter (Signed)
She should call urology to reschedule appt

## 2009-07-10 NOTE — Telephone Encounter (Signed)
Called patient, advised her to call to re-schedule urology appointment, she agreed.

## 2009-08-03 ENCOUNTER — Telehealth (HOSPITAL_BASED_OUTPATIENT_CLINIC_OR_DEPARTMENT_OTHER): Payer: Self-pay

## 2009-08-03 NOTE — Telephone Encounter (Signed)
Message copied by Emelia Loron on Fri Aug 03, 2009  2:41 PM  ------       Message from: Kennyth Lose       Created: Fri Aug 03, 2009  2:14 PM         [facility]              Atlantis Delong [id number], 54 year old, female, Telephone Information:       Home Phone      [phone]       Work Phone      [phone]              [name] NUMBER: [phone]              Patient's language of care: English              Patient does not need an interpreter.              Patient's PCP: Hildred Laser, MD              Person calling on behalf of patient: Patient (self)              Calls today was given urology appt for June.  Says she cant wait that long.              Patient's [facility]        [facility] [address]              Phone: [phone]

## 2009-08-03 NOTE — Telephone Encounter (Signed)
Called patient back, advised her to call medical specialties to determine if she can be seen sooner than June, she agreed to do this.

## 2009-09-26 ENCOUNTER — Encounter (HOSPITAL_BASED_OUTPATIENT_CLINIC_OR_DEPARTMENT_OTHER): Payer: MEDICARE | Admitting: Internal Medicine

## 2009-09-28 ENCOUNTER — Encounter (HOSPITAL_BASED_OUTPATIENT_CLINIC_OR_DEPARTMENT_OTHER): Payer: MEDICARE | Admitting: Internal Medicine

## 2009-10-01 ENCOUNTER — Ambulatory Visit (HOSPITAL_BASED_OUTPATIENT_CLINIC_OR_DEPARTMENT_OTHER): Payer: MEDICARE | Admitting: Urology

## 2009-11-01 ENCOUNTER — Telehealth (HOSPITAL_BASED_OUTPATIENT_CLINIC_OR_DEPARTMENT_OTHER): Payer: Self-pay | Admitting: Internal Medicine

## 2009-11-01 NOTE — Telephone Encounter (Signed)
Called home# lm to call office- patient needs mammo /coloscopy booked . Letter sent

## 2009-11-09 ENCOUNTER — Telehealth (HOSPITAL_BASED_OUTPATIENT_CLINIC_OR_DEPARTMENT_OTHER): Payer: Self-pay

## 2009-11-09 NOTE — Telephone Encounter (Signed)
Message copied by Emelia Loron on Fri Nov 09, 2009  4:07 PM  ------       Message from: Sharyne Richters       Created: Fri Nov 09, 2009  3:19 PM       Regarding: rx for flexeril needed asap       Contact: [phone]         [facility]              Person calling on behalf of patient: patient              May list multiple medications in this section       Medicine Name:         Naproxen       1) (EC-NAPROSYN) 375 MG TBEC 60 tablet 0 07/02/2009 08/02/2009 --          Sig - Route:  Take 1 tablet by mouth 2 (two) times daily. - Oral          Class:  E-Prescribe               2) FLEXERIL 10 MG OR TABS 90 1 09/11/2008 11/11/2008 --          Sig - Route:  Take 1 tab by mouth every 8 hours as needed for muscle spasm - Oral          Class:  E-Prescribe               WOULD LIKE #2 RX FOR TODAY       Dosage:         Frequency (how many pills, how many times a day):         Number of pills left:         Documented patient preferred pharmacies:       [facility]              Phone: [phone] Fax: [phone]

## 2009-11-09 NOTE — Telephone Encounter (Signed)
Received request below, these medications have not been prescribed in over one year, called patient, she reports she is having pain behind her left knee, patient feels this is a muscle strain., she reports knee is swollen. Painful with weight bearing. Has been taking ibuprofen with little effect, would like flexeril and naprosyn. Advised patient she should be seen, appt scheduled with provider tomorrow.

## 2009-11-10 ENCOUNTER — Ambulatory Visit (HOSPITAL_BASED_OUTPATIENT_CLINIC_OR_DEPARTMENT_OTHER): Payer: MEDICARE | Admitting: Internal Medicine

## 2009-11-10 ENCOUNTER — Encounter (HOSPITAL_BASED_OUTPATIENT_CLINIC_OR_DEPARTMENT_OTHER): Payer: Self-pay | Admitting: Internal Medicine

## 2009-11-10 VITALS — BP 140/90 | HR 73 | Temp 98.0°F | Ht 66.0 in | Wt 204.0 lb

## 2009-11-10 DIAGNOSIS — F1121 Opioid dependence, in remission: Secondary | ICD-10-CM

## 2009-11-10 DIAGNOSIS — R319 Hematuria, unspecified: Secondary | ICD-10-CM

## 2009-11-10 DIAGNOSIS — Z0000 Encounter for general adult medical examination without abnormal findings: Secondary | ICD-10-CM

## 2009-11-10 DIAGNOSIS — M25562 Pain in left knee: Secondary | ICD-10-CM

## 2009-11-10 DIAGNOSIS — W57XXXA Bitten or stung by nonvenomous insect and other nonvenomous arthropods, initial encounter: Secondary | ICD-10-CM

## 2009-11-10 DIAGNOSIS — E669 Obesity, unspecified: Secondary | ICD-10-CM

## 2009-11-10 MED ORDER — DICLOFENAC SODIUM 75 MG PO TBEC
75.0000 mg | DELAYED_RELEASE_TABLET | Freq: Two times a day (BID) | ORAL | Status: DC
Start: 2009-11-10 — End: 2009-11-21

## 2009-11-10 MED ORDER — CETIRIZINE HCL 10 MG PO TABS
10.00 mg | ORAL_TABLET | Freq: Every day | ORAL | Status: AC
Start: 2009-11-10 — End: 2010-05-10

## 2009-11-10 MED ORDER — TRIAMCINOLONE ACETONIDE 0.1 % EX CREA
TOPICAL_CREAM | Freq: Two times a day (BID) | CUTANEOUS | Status: AC
Start: 2009-11-10 — End: 2010-01-11

## 2009-11-10 NOTE — Patient Instructions (Signed)
[facility]   [address],   [phone]  Spanish services available    [facility]  MULT[facility]LE [facility]ENT[facility]  [address], [place]   INTAKE  [phone]    [facility]  [place] [facility]  [address], [place]  INTAKE  [phone]    [facility] [place]  [address], [place]  [phone]    [facility]  [address], [place]  [phone]    [facility]  [address], [address]  [place], [place] [address]  [phone]    [facility]  [address], [place]  [phone]  FREE CARE  MOST LANGUAGES AVAILABLE    OUT-PT ADDICTIONS   [address], [place]  [phone]  FREE CARE  MOST LANGUAGES AVAILABLE

## 2009-11-10 NOTE — Progress Notes (Signed)
54 year old female seen in urgent care complaining of:    Left knee pain  Started about two weeks ago when knee locked out  Initially resolved with 4-5 days rest, then got worse  Notes clicking in knee  Swelling in knee as well  Knee is getting locked in extension periodically    Normally is able to work out and walk quite a bit    BP 140/90   Pulse 73   Temp(Src) 98 F (36.7 C) (Oral)   Ht 5\' 6"  (1.676 m)   Wt 204 lb (92.534 kg)   BMI 32.93 kg/m2   SpO2 97%  Regular rate and rhythm  Lungs clear  Left knee:  Small effusion  Medial joint line tenderness  + medial pain on valgus stress    Assessment and plan:  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

## 2009-11-12 ENCOUNTER — Telehealth (HOSPITAL_BASED_OUTPATIENT_CLINIC_OR_DEPARTMENT_OTHER): Payer: Self-pay | Admitting: Social Worker

## 2009-11-12 NOTE — Telephone Encounter (Signed)
[facility] [place] - THE [facility] << Less Detail      From Laney Pastor   Sent Monday November 12, 2009   3:46 PM   To Thedora Hinders   Phone [phone] (Call me)   Subject THE RIDE   Patient Cheryl Dixon [[id number]] (DOB: Jul 30, 1955)   Phone Entered Pt Work Pt Home   [phone]  (call me) [phone]          --------------------------------------------------------------------------------        [facility]    Selia Wareing [id number], 54 year old, female, Telephone Information:  Home Phone      [phone]  Work Phone      [phone]  Mobile          Not on file.  Home Phone      Not on file.      [name] NUMBER: [phone]  Best time to call back:   Cell phone:   Other phone:    Available times:    Patient's language of care: English    Patient does not need an interpreter.    Patient's PCP: Hildred Laser, MD    Person calling on behalf of patient: Patient (self)    Calls today with questions and concerns. Patient would like for you to give her a call regarding the ride because she mentioned she's not able to get to her appointment since her knee as been worsening. And I also schedule her an appointment with you on July 27 at 3:00 PM    Patient's Preferred Pharmacy:   [facility]    Phone: [phone] Fax: [phone]

## 2009-11-12 NOTE — Telephone Encounter (Signed)
This Clinical research associate has received information from patient needed for PT-1 [employer] transportation forms. PT-1 forms for [employer] transportation to the following providers was submitted electronically to [employer]-to see Cheryl Dixon in [place] tracking #[id number] and to go to radiology [facility] tracking [id number].

## 2009-11-15 ENCOUNTER — Ambulatory Visit (HOSPITAL_BASED_OUTPATIENT_CLINIC_OR_DEPARTMENT_OTHER): Payer: MEDICARE | Admitting: Urology

## 2009-11-19 ENCOUNTER — Telehealth (HOSPITAL_BASED_OUTPATIENT_CLINIC_OR_DEPARTMENT_OTHER): Payer: Self-pay

## 2009-11-19 NOTE — Telephone Encounter (Signed)
I have attempted to contact this patient by phone with the following results: left message to return my call on answering machine.

## 2009-11-19 NOTE — Telephone Encounter (Signed)
Message copied by Sherrine Maples on Mon Nov 19, 2009 12:07 PM  ------       Message from: BIEN-AIME, PETER       Created: Mon Nov 19, 2009  9:20 AM       Regarding: pt calls with knee pain       Contact: [phone]         [facility]              Cheryl Dixon [id number], 54 year old, female, Telephone Information:       Home Phone      [phone]       Work Phone      [phone]       Mobile          Not on file.       Home Phone      Not on file.                     [name] NUMBER: [phone]       Best time to call back: anytime       Cell phone:        Other phone:              Available times:              Patient's language of care: English              Patient does not need an interpreter.              Patient's PCP: Hildred Laser, MD              Person calling on behalf of patient: Patient (self)              Calls today: pt claims that knee pain has gotten worse and believes she would be better off on previous script.              Patient's [facility]erred [facility]acy:        [facility]              Phone: [phone] Fax: [phone]

## 2009-11-21 ENCOUNTER — Telehealth (HOSPITAL_BASED_OUTPATIENT_CLINIC_OR_DEPARTMENT_OTHER): Payer: Self-pay | Admitting: Social Worker

## 2009-11-21 ENCOUNTER — Encounter (HOSPITAL_BASED_OUTPATIENT_CLINIC_OR_DEPARTMENT_OTHER): Payer: MEDICARE | Admitting: Internal Medicine

## 2009-11-21 ENCOUNTER — Encounter (HOSPITAL_BASED_OUTPATIENT_CLINIC_OR_DEPARTMENT_OTHER): Payer: MEDICARE | Admitting: Orthopaedic Surgery

## 2009-11-21 ENCOUNTER — Ambulatory Visit (HOSPITAL_BASED_OUTPATIENT_CLINIC_OR_DEPARTMENT_OTHER): Payer: MEDICARE | Admitting: Social Worker

## 2009-11-21 MED ORDER — NAPROXEN 500 MG PO TABS
500.0000 mg | ORAL_TABLET | Freq: Two times a day (BID) | ORAL | Status: DC
Start: 2009-11-21 — End: 2010-03-06

## 2009-11-21 NOTE — Telephone Encounter (Signed)
This Clinical research associate was informed by patient that [employer] PT-1 transportation form to see Cheryl Dixon in [place] was denied. PT-1 form for patient to see  Dr. Patrcia Dolly re-submitted electronically to [employer]. tracking number [id number].

## 2009-11-21 NOTE — Telephone Encounter (Signed)
Naprosyn was previous Rx I think  Please let her know that I have sent this in to her pharmacy.

## 2009-11-22 NOTE — Telephone Encounter (Signed)
I have attempted to contact this patient by phone with the following results: left message on personal VM that script for naproxen has been sent to her pharmacy

## 2009-11-26 ENCOUNTER — Telehealth (HOSPITAL_BASED_OUTPATIENT_CLINIC_OR_DEPARTMENT_OTHER): Payer: Self-pay | Admitting: Internal Medicine

## 2009-11-26 ENCOUNTER — Other Ambulatory Visit (HOSPITAL_BASED_OUTPATIENT_CLINIC_OR_DEPARTMENT_OTHER): Payer: Self-pay | Admitting: Internal Medicine

## 2009-11-26 NOTE — Telephone Encounter (Signed)
Scheduled mammo for patient on 12/14/09 @ 1pm at [facility].Rescheduled cpe with pcp for 12/10/09 3:15pm with Dr Diona Foley.

## 2009-11-27 ENCOUNTER — Telehealth (HOSPITAL_BASED_OUTPATIENT_CLINIC_OR_DEPARTMENT_OTHER): Payer: Self-pay | Admitting: Ambulatory Care

## 2009-11-27 NOTE — Telephone Encounter (Signed)
Message copied by Arta Bruce on Tue Nov 27, 2009  2:42 PM  ------       Message from: Lauris Chroman       Created: Tue Nov 27, 2009  2:04 PM       Regarding: meds         [facility]              Debby Clyne [id number], 54 year old, female, Telephone Information:       Home Phone      [phone]       Work Phone      [phone]       Mobile          Not on file.       Home Phone      Not on file.                     [name] NUMBER: [phone]       Best time to call back:        Cell phone:        Other phone:              Available times:              Patient's language of care: English              Patient does not need an interpreter.              Patient's PCP: Hildred Laser, MD              Person calling on behalf of patient: Patient (self)              Calls today to speak to nurse about going on med for nerotine 800 mg for knee pain. Pt is extreme pain              Patient's Preferred Pharmacy:        [facility]              Phone: [phone] Fax: [phone]

## 2009-11-27 NOTE — Telephone Encounter (Signed)
Patient states that she is in a lot of pain. Pain level 8/10. States that she had watched her grandchildren over the weekend and spent a lot of time on the floor. Since then she has been in pain. Has tried diclofenec 75 mg po without any relief. Tried Naprosyn 500 mg po without relief. Pain is shooting up her leg. Spent the last 2 days with her legs elevated without relief. Walks a little bit each day which she finds difficult due to the pain. Patient wants to know if there is anything else she may take for relief. Someone gave her neurontin tablet which gave her good relief from the pain.

## 2009-11-29 NOTE — Telephone Encounter (Signed)
I returned call, left VM to call back.  My impression on our last visit was that her pain ws from a problem in the knee and neurontin is really not indicated for that.  She canceled appt with ortho on 7/27 too.  So I would like to talk to her more about the nature of her pain to figure out the right med for her.

## 2009-12-04 ENCOUNTER — Telehealth (HOSPITAL_BASED_OUTPATIENT_CLINIC_OR_DEPARTMENT_OTHER): Payer: Self-pay | Admitting: Registered Nurse

## 2009-12-04 NOTE — Telephone Encounter (Signed)
[facility]    Cheryl Dixon [id number], 54 year old, female, Telephone Information:  Home Phone [phone]  Work Phone [phone]  Mobile Not on file.  Home Phone Not on file.      [name] NUMBER: [phone]  Best time to call back:   Cell phone:   Other phone:    Available times:    Patient's language of care: English    Patient does not need an interpreter.    Patient's PCP: Hildred Laser, MD    Person calling on behalf of patient: Patient (self)    Calls today meds is working a lot better. Want to let provider know. Although has been getting a lot of charlie hoarses    Patient's Preferred Pharmacy:   [facility]    Phone: [phone] Fax: [phone]   Returned call, spoke to pt who reports that she is having good effect to naproxen however she is reporting "charlie horses" on inside of thigh which is 9/10. Reports vein is "hard" but not warm to touch. She is continuing to do her exercises and stretching as before. Reports she has not done anything out of ordinary to strain area.  She thinks it has to do with varicose veins. She is using ice and elevation with fair effect. Pt has appt with provider 12/10/09. She does not think she needs to be seen earlier. She will call if quality of pain changes or causes distress.

## 2009-12-05 ENCOUNTER — Encounter: Admission: RE | Admit: 2009-12-05 | Discharge: 2009-12-05 | Payer: Self-pay | Admitting: Family Medicine

## 2009-12-10 ENCOUNTER — Encounter (HOSPITAL_BASED_OUTPATIENT_CLINIC_OR_DEPARTMENT_OTHER): Payer: MEDICARE | Admitting: Internal Medicine

## 2009-12-11 ENCOUNTER — Telehealth (HOSPITAL_BASED_OUTPATIENT_CLINIC_OR_DEPARTMENT_OTHER): Payer: Self-pay | Admitting: Social Worker

## 2009-12-11 NOTE — Telephone Encounter (Signed)
This Clinical research associate spoke with patient, who informed this Clinical research associate that her Pt-1 forms to see Cheryl Dixon at [facility] have now been denied twice.  Patient agreed that this writer ask Dr. Diona Foley if patient can be referred to an orthopedist at [facility]  or in [place].   Dr.Roll,please have the office let patient know if this is possible. She will then let me know who she needs transportation for. Thanks.

## 2009-12-12 NOTE — Telephone Encounter (Signed)
Isabelle Course -- Please let patient know that   1) [facility] sees pts at [facility] site, not [facility] now  2) I don't know an orthopedist in [place].  She can call [facility] for appt in [place] if she wants: [phone].    Then call us back with appt info so we can do referral.

## 2009-12-13 ENCOUNTER — Other Ambulatory Visit (HOSPITAL_BASED_OUTPATIENT_CLINIC_OR_DEPARTMENT_OTHER): Payer: Self-pay | Admitting: Internal Medicine

## 2009-12-13 DIAGNOSIS — M25561 Pain in right knee: Secondary | ICD-10-CM

## 2009-12-14 ENCOUNTER — Encounter: Payer: Self-pay | Admitting: Internal Medicine

## 2009-12-14 NOTE — Telephone Encounter (Addendum)
Late Associate Professor spoke with patient on 12/13/09, who said she will call the office for referral to [facility] in [place]. PT-1 form for patient to go therewas submitted electronically [facility] today. tracking number [id number].

## 2009-12-19 ENCOUNTER — Encounter (HOSPITAL_BASED_OUTPATIENT_CLINIC_OR_DEPARTMENT_OTHER): Payer: MEDICARE | Admitting: Orthopaedic Surgery

## 2009-12-19 NOTE — Telephone Encounter (Signed)
This Clinical research associate spoke with patient, who is aware that PT-1 form to go to [facility] in [place] was approved.

## 2009-12-27 ENCOUNTER — Other Ambulatory Visit (HOSPITAL_BASED_OUTPATIENT_CLINIC_OR_DEPARTMENT_OTHER): Payer: Self-pay | Admitting: Registered Nurse

## 2009-12-27 NOTE — Telephone Encounter (Signed)
Entered Pt Work [employer]    [phone]  (call me) [phone]           [facility]    Cheryl Dixon [id number], 54 year old, female, Telephone Information:  Home Phone [phone]  Work Phone [phone]  Mobile Not on file.  Home Phone Not on file.      Cheryl Dixon NUMBER: [phone]  Best time to call back: anytime  Cell phone:   Other phone:    Available times:    Patient's language of care: English    Patient does not need an interpreter.    Patient's PCP: Hildred Laser, MD    Person calling on behalf of patient: Patient (self)    Calls today: pt would like to speak with the provider about meds    [facility]'s [facility]    Phone: [phone] Fax: [phone]     Returned call, left message on VM

## 2009-12-27 NOTE — Telephone Encounter (Signed)
Spoke to pt who has been trying to find outpt psych provider but has not yet been able to get appt with provider she "clicks with". She is concerned about refills for meds which will expire 12/31/09. She does not feel that previous provider at [facility] clinic will be willing to order meds due to pt's missed appts, feels she "burnt a bridge".  She is still trying to get outpt appt but is requesting provider to consider refills as above. Will review with provider.

## 2009-12-28 NOTE — Telephone Encounter (Signed)
I left VM for pt to call back.  She should stay with her current psychaitrist until she finds a new one.  At minimum she should call MD at [facility] to find out if he will refill meds before asking me to do it.  If she is unable to work things out this way, I can see her to discuss temporarily bridging her meds.  Looks like the first available is next Friday so she will have to stretch her medications to that point.    We will need to discuss her psychaitric diagnosis, get permission to review records, signs controlled substance rx etc. -- so we really need a visit to do this rather than trying to do it over the phone.

## 2010-01-03 ENCOUNTER — Telehealth (HOSPITAL_BASED_OUTPATIENT_CLINIC_OR_DEPARTMENT_OTHER): Payer: Self-pay | Admitting: Internal Medicine

## 2010-01-03 NOTE — Telephone Encounter (Signed)
Called home# confirmed appt with patient for 01/04/10

## 2010-01-04 ENCOUNTER — Ambulatory Visit (HOSPITAL_BASED_OUTPATIENT_CLINIC_OR_DEPARTMENT_OTHER): Payer: MEDICARE | Admitting: Internal Medicine

## 2010-01-04 VITALS — BP 118/84 | Temp 98.8°F | Ht 62.99 in | Wt 203.0 lb

## 2010-01-04 DIAGNOSIS — F411 Generalized anxiety disorder: Secondary | ICD-10-CM

## 2010-01-04 DIAGNOSIS — J40 Bronchitis, not specified as acute or chronic: Secondary | ICD-10-CM

## 2010-01-04 DIAGNOSIS — E669 Obesity, unspecified: Secondary | ICD-10-CM

## 2010-01-04 DIAGNOSIS — Z0000 Encounter for general adult medical examination without abnormal findings: Secondary | ICD-10-CM

## 2010-01-04 DIAGNOSIS — Z1239 Encounter for other screening for malignant neoplasm of breast: Secondary | ICD-10-CM

## 2010-01-04 DIAGNOSIS — F1121 Opioid dependence, in remission: Secondary | ICD-10-CM

## 2010-01-04 MED ORDER — BUPROPION HCL ER (SR) 200 MG PO TB12
200.0000 mg | ORAL_TABLET | Freq: Two times a day (BID) | ORAL | Status: DC
Start: 2010-01-04 — End: 2010-01-28

## 2010-01-04 MED ORDER — CLONAZEPAM 1 MG PO TABS
1.0000 mg | ORAL_TABLET | Freq: Three times a day (TID) | ORAL | Status: DC | PRN
Start: 2010-01-04 — End: 2010-01-28

## 2010-01-04 MED ORDER — AZITHROMYCIN 250 MG PO TABS
250.00 mg | ORAL_TABLET | ORAL | Status: AC
Start: 2010-01-04 — End: 2010-01-10

## 2010-01-04 NOTE — Progress Notes (Signed)
54 year old female seen in follow up:    Left knee pain  Working on getting an orthopedic appointment  Will see Dr. Maebelle Munroe in [place]  Left knee numbness, intermittent, feeling of hot sensation in leg    Opiate dependence  Has tapered down to 69 mg daily    Psychiatry transition  Has been seeing Dr. Erskine Squibb [place] off and on for about 20 years  Has been discharged from treatment at [facility] because of repeated missed appointments  Has plans to go to [address] in [place], [facility], but will be running out of medications this month.  Ran out of medications yesterday, has been taking 1/2 doses  Taking clonazepam sometimes 3 mg qhs  Tried doxepin    Cough productive of green sputum recently  Smoking 1.5 ppd  Has tried the patch in the past    ROS: No TIA's or dysphagia. No prolonged cough. No dyspnea or chest pain on exertion.  No abdominal pain, change in bowel habits, black or bloody stools.  No urinary tract symptoms. She is post menopausal. No hot flashes, abnormal vaginal bleeding, discharge or unexpected pelvic pain. No new breast lumps, breast pain or nipple discharge.    Past medical history, social history, and family history were reviewed and updated in the electronic medical record.    BP 118/84   Temp(Src) 98.8 F (37.1 C) (Oral)   Ht 5' 2.99" (1.6 m)   Wt 203 lb (92.08 kg)   BMI 35.97 kg/m2  GEN: Alert and oriented, cooperative, anxious  HEENT: Sclerae anicteric, oropharynx clear.    NECK:  No thyromegaly or adenopathy  LUNGS:  Scattered expiratory wheezes  BREAST:  deferred  CARDIOVASCULAR:  Regular rate and rhythm, normal S1 and S2 without murmurs, rubs, or gallops.  ABDOMEN:  Soft, no tenderness, masses or organomegaly. EXTREMITIES:  Warm and well perfused without clubbing, cyanosis, or edema.  NEUROLOGIC:  Nonfocal with normal reflexes  SKIN:  No rashes    Assessment and plan:  V70.0 Routine general medical examination at a health care facility  (primary encounter diagnosis)  Comment:    Plan: ROUTINE  VENIPUNCTURE, CHOLESTEROL, HIGH DENSITY        LIPOPROTEIN, LOW DENSITY LIPOPROTEIN,DIRECT,         COMPREHENSIVE METABOLIC PANEL             304.03 Opioid type dependence, in remission  Comment:  Still has a long way to go with taper, may be candidate for suboxone one day  Plan: methadone (DOLOPHINE) 10 mg/mL solution, URINE         DRUG SCREEN, 7 DRUGS, METHADONE SCREEN URINE,         OXYCODONE SCREEN URINE, BUPRENORPHINE SCREEN,         URINE             300.00 ANXIETY STATE NOS  Comment: increased symptoms may be related in part to taper off suboxone  Plan: buPROPion (WELLBUTRIN SR) 200 MG 12 hr tablet,         clonazepam (KLONOPIN) 1 MG tablet        Bridge therapy.  I reviewed with patient that I will be willing to prescribe meds for two months only, until she is able to make new psychopharmacology arrangements.  Latest prescription verified with pharmacy, [facility].    490G Bronchitis  Comment:    Plan: azithromycin (ZITHROMAX) 250 MG tablet             278.00J Obesity  Comment:  Plan: HEMOGLOBIN A1C

## 2010-01-07 LAB — URINE DRUG SCREEN 7 DRUGS
AMPHETAMINES URINE: NEGATIVE
BARBITURATES URINE: NEGATIVE
BENZODIAZEPINES URINE: POSITIVE — AB
CANNABINOIDS URINE: NEGATIVE
COCAINE METABOLITES URINE: NEGATIVE
ETHANOL URINE: NEGATIVE
OPIATES URINE: NEGATIVE

## 2010-01-07 LAB — OXYCODONE SCREEN URINE
OXYCOD SCRN URINE: NEGATIVE
OXYCOD UR SPEC GRAV: 1.01 (ref 1.003–1.035)
OXYCOD URINE CREAT: 49 mg/dl
OXYCOD URINE PH: 7 (ref 4.5–8.5)

## 2010-01-07 LAB — METHADONE URINE: METHADONE URINE: POSITIVE — AB

## 2010-01-07 LAB — BUPRENORPHINE SCREEN URINE: BUPRENORPHINE SCREEN URINE: NEGATIVE — AB

## 2010-01-07 NOTE — Progress Notes (Signed)
Quick Note:    Expected result  ______

## 2010-01-08 ENCOUNTER — Other Ambulatory Visit (HOSPITAL_BASED_OUTPATIENT_CLINIC_OR_DEPARTMENT_OTHER): Payer: Self-pay

## 2010-01-08 LAB — COMPREHENSIVE METABOLIC PANEL
ALANINE AMINOTRANSFERASE: 27 IU/L (ref 7–35)
ALBUMIN: 4.2 g/dl (ref 3.4–4.8)
ALKALINE PHOSPHATASE: 60 IU/L (ref 25–106)
ANION GAP: 5 mmol/L (ref 2–25)
ASPARTATE AMINOTRANSFERASE: 31 IU/L (ref 8–34)
BILIRUBIN TOTAL: 0.6 mg/dl (ref 0.2–1.1)
BUN (UREA NITROGEN): 8 mg/dl (ref 6–20)
CALCIUM: 9.1 mg/dl (ref 8.6–10.3)
CARBON DIOXIDE: 31 mmol/L (ref 22–32)
CHLORIDE: 106 mmol/L (ref 101–111)
CREATININE: 0.8 mg/dl (ref 0.4–1.2)
ESTIMATED GLOMERULAR FILT RATE: >60 mL/min (ref >60)
Glucose Random: 101 mg/dl (ref 74–160)
POTASSIUM: 4.6 mmol/L (ref 3.5–5.1)
SODIUM: 142 mmol/L (ref 135–144)
TOTAL PROTEIN: 7 g/dl (ref 5.9–7.5)

## 2010-01-08 LAB — CHOLESTEROL: Cholesterol: 264 mg/dl — ABNORMAL HIGH (ref 0–200)

## 2010-01-08 LAB — HEMOGLOBIN A1C
ESTIMATED AVERAGE GLUCOSE: 120 (ref 74–160)
HEMOGLOBIN A1C: 5.8 % (ref 0–6.0)

## 2010-01-08 LAB — CHG LIPOPROTEIN DIRECT MEASUREMENT LDL CHOLESTEROL: LOW DENSITY LIPOPROTEIN DIRECT: 101 mg/dl — ABNORMAL HIGH (ref 0–100)

## 2010-01-08 LAB — CHG LIPOPROTEIN DIR MEAS HIGH DENSITY CHOLESTEROL: HIGH DENSITY LIPOPROTEIN: 38 mg/dl (ref 35–85)

## 2010-01-08 MED ORDER — AZITHROMYCIN 250 MG PO TABS
250.00 mg | ORAL_TABLET | Freq: Every day | ORAL | Status: AC
Start: 2010-01-08 — End: 2010-01-13

## 2010-01-08 NOTE — Telephone Encounter (Signed)
I sent in replacement rx

## 2010-01-08 NOTE — Telephone Encounter (Signed)
Tried to call patient, no answer, left message on private voicemail that new rx was sent to pharmacy.

## 2010-01-08 NOTE — Telephone Encounter (Signed)
Called patient back, she filled Z-pack, only took first dose-2 pills, then package was mistakenly thrown away. Advised patient I would send message to provider.

## 2010-01-08 NOTE — Telephone Encounter (Signed)
Message copied by Emelia Loron on Tue Jan 08, 2010  4:43 PM  ------       Message from: Sharyne Richters       Created: Tue Jan 08, 2010  3:53 PM       Regarding: rx lost       Contact: [phone]         Pt lost rx she received last week.        azithromycin (ZITHROMAX) 250 MG tablet [[id number]]               Pt threw it away by mistake, For broncitus       Last taken 01-05-10

## 2010-01-09 NOTE — Progress Notes (Addendum)
Quick Note:    Please send letter with test results and this comment:    All labs are within acceptable limits.    ______

## 2010-01-10 ENCOUNTER — Telehealth (HOSPITAL_BASED_OUTPATIENT_CLINIC_OR_DEPARTMENT_OTHER): Payer: Self-pay

## 2010-01-10 NOTE — Telephone Encounter (Signed)
Received message requesting Rx be refaxed to pharmacy, tried to call pharmacy, phone kept ringing, ? Too early, message sent to provider/[place].

## 2010-01-10 NOTE — Telephone Encounter (Addendum)
Addended by: Shondale Quinley on: 01/10/2010      Modules accepted: Disposition Section

## 2010-01-10 NOTE — Telephone Encounter (Addendum)
Called pharmacy back, prescription re-faxed was original dated 9/9 which was already filled, called prescription in to pharmacy as written in epic.  azithromycin (ZITHROMAX) 250 MG tablet [[id number]]  Order Details       Dose: 250 mg  Route: Oral  Frequency: DAILY     Dispense Quantity: 5 tablet  Refills: 0  Fills Remaining: 0               Sig: Take 1 tablet by mouth daily.

## 2010-01-10 NOTE — Telephone Encounter (Signed)
Prescription found and refaxed.

## 2010-01-10 NOTE — Telephone Encounter (Signed)
Message copied by Emelia Loron on Thu Jan 10, 2010  8:50 AM  ------       Message from: Sharyne Richters       Created: Wed Jan 09, 2010  5:01 PM       Regarding: fax rx [facility] awaiting)         Please refax rx written 01-08-10       azithromycin (ZITHROMAX) 250 MG tablet [[id number]]                [facility] Fax [phone]              Dawn - In case this rx is in Roll's office to be faxed, please fax.

## 2010-01-10 NOTE — Telephone Encounter (Addendum)
Jillyn Ledger - Pharmacy call ','<More Detail >>   From [facility] A. [facility]   Sent Thursday January 10, 2010 9:46 AM   To [name]   Subject Pharmacy call   Patient Malayjah Otoole [[id number]] (DOB: 1955-08-21)   Phone Pt Work Pt Home     [phone]           [facility]    Iline Buchinger [id number], 54 year old, female, Telephone Information:  Home Phone [phone]  Work Phone [phone]  Mobile Not on file.  Home Phone Not on file.      CALL BACK NUMBER: [phone] Roylene Reason time to call back:   Cell phone:   Other phone:    Available times:    Patient's language of care: English    Patient does not need an interpreter.    Patient's PCP: Hildred Laser, MD    Person calling on behalf of patient: Pharmacy     Calls today with questions and concerns. Patient needs a new prescription send to the pharmacy with a new date.    Patient's [facility]:   [facility]    Phone: [phone] Fax: [phone]

## 2010-01-18 ENCOUNTER — Telehealth (HOSPITAL_BASED_OUTPATIENT_CLINIC_OR_DEPARTMENT_OTHER): Payer: Self-pay | Admitting: Internal Medicine

## 2010-01-18 NOTE — Telephone Encounter (Signed)
Called home# lm to cancel appt booked on 01/23/10 PCP has meeting . Lm to call back and reschedule appt

## 2010-01-23 ENCOUNTER — Encounter (HOSPITAL_BASED_OUTPATIENT_CLINIC_OR_DEPARTMENT_OTHER): Payer: MEDICARE | Admitting: Internal Medicine

## 2010-01-28 ENCOUNTER — Other Ambulatory Visit (HOSPITAL_BASED_OUTPATIENT_CLINIC_OR_DEPARTMENT_OTHER): Payer: Self-pay | Admitting: Internal Medicine

## 2010-01-28 DIAGNOSIS — F411 Generalized anxiety disorder: Secondary | ICD-10-CM

## 2010-01-28 MED ORDER — BUPROPION HCL ER (SR) 200 MG PO TB12
200.0000 mg | ORAL_TABLET | Freq: Two times a day (BID) | ORAL | Status: DC
Start: 2010-02-03 — End: 2010-03-06

## 2010-01-28 NOTE — Telephone Encounter (Signed)
Message copied by Cori Razor on Mon Jan 28, 2010  6:15 PM  ------       Message from: Sharyne Richters       Created: Mon Jan 28, 2010 12:55 PM       Regarding: refill (see note)                 [facility]              Person calling on behalf of patient: Patient (self)              May list multiple medications in this section       Medicine Name:        clonazepam (KLONOPIN) 1 MG tablet [[id number]]         buPROPion (WELLBUTRIN SR) 200 MG 12 hr tablet [[id number]]                Pt has appt 10-8 at [address], needs refill before the weekend.               Dosage:         Frequency (how many pills, how many times a day):         Number of pills left:         Documented patient preferred pharmacies:       [facility]              Phone: [phone] Fax: [phone]

## 2010-01-28 NOTE — Telephone Encounter (Signed)
wellbutrin refilled  Cheryl Dixon -- please ask patient if she has made arrangements to see a new psychiatrist. I need to know this before I can refill her clonazepam (I am prescribing only as a bridge until she sees new psychaitrist).

## 2010-01-28 NOTE — Telephone Encounter (Signed)
Cheryl Dixon is a 54 year old female has requested a refill of medication listed above.  Clonazepam - Last fill date 01/04/10, with end date 02/03/10.  Other Med Adult:  Most Recent BP Reading(s)  01/04/10 : 118/84        Cholesterol (mg/dl)   Date     Date  Value    01/04/2010  264*   ----------    LDL (mg/dl)   Date     Date  Value    01/04/2010  101*   ----------    HDL (mg/dl)   Date     Date  Value    01/04/2010  38    ----------    No results found for this basename: tg:1        No results found for this basename: TSHSC:1        No results found for this basename: TSH:1      HEMOGLOBIN A1C (%)   Date     Date  Value    01/04/2010  5.8    ----------        INR (no units)   Date     Date  Value    02/16/2007  1.0*    07/03/2006  < 1.0*   ----------       Documented patient preferred pharmacies:  [name]: [phone] Fax: [phone]

## 2010-01-29 ENCOUNTER — Other Ambulatory Visit (HOSPITAL_BASED_OUTPATIENT_CLINIC_OR_DEPARTMENT_OTHER): Payer: Self-pay | Admitting: Ambulatory Care

## 2010-01-29 NOTE — Telephone Encounter (Signed)
Message copied by Arta Bruce on Tue Jan 29, 2010  2:32 PM  ------       Message from: Hilliard Clark A.       Created: Tue Jan 29, 2010 10:02 AM       Regarding: questions regarding regarding Results.       Contact: [phone]         [facility]              Cheryl Dixon [id number], 54 year old, female, Telephone Information:       Home Phone      [phone]       Work Phone      [phone]       Mobile          Not on file.       Home Phone      Not on file.              Cheryl Dixon NUMBER: [phone]              Patient's language of care: English              Patient does not need an interpreter.              Patient's PCP: Hildred Laser, MD              Person calling on behalf of patient: Patient (self)              Calls today with questions regarding regarding Results.              Patient's Preferred Pharmacy:        [facility]              Phone: [phone] Fax: [phone]

## 2010-01-29 NOTE — Telephone Encounter (Signed)
Patient states that she has an appointment tomorrow at [address] for counseling. She states that a patient has three counseling sessions and then sees a psychiatrist. Patient states that she will be out of her welbutrin and klonopin in a few days. Patient requesting that Dr. Diona Foley give her another two week or one month prescription

## 2010-01-31 MED ORDER — CLONAZEPAM 1 MG PO TABS
1.0000 mg | ORAL_TABLET | Freq: Three times a day (TID) | ORAL | Status: DC | PRN
Start: 2010-02-03 — End: 2010-02-02

## 2010-01-31 NOTE — Telephone Encounter (Signed)
Prescription printed. It will be signed and placed at the front desk within 24 hours for the patient to pick up.    Patients who need to pick up prescriptions at the office should be reminded to call at least three days before they run out of medication so that there is adequate time to process the request.

## 2010-01-31 NOTE — Telephone Encounter (Signed)
Message copied by Emelia Loron on Thu Jan 31, 2010 11:50 AM  ------       Message from: Kathrin Penner       Created: Thu Jan 31, 2010  9:25 AM       Regarding: REFILL       Contact: [phone]         [facility]              Person calling on behalf of patient: PHARMACY CALL              May list multiple medications in this section       Medicine Name: clonazepam Scarlette Calico) 1 MG               Documented patient preferred pharmacies:       [facility]              Phone: [phone] Fax: [phone]                     [name] NUMBER: [phone]              Patient's language of care: English              Patient does not need an interpreter.

## 2010-01-31 NOTE — Telephone Encounter (Signed)
Received another request for clonazepam- Alona Bene spoke to patient Tuesday-see note :  Arta Bruce, RN 01/29/10 02:41 PM Signed   Patient states that she has an appointment tomorrow at [address] for counseling. She states that a patient has three counseling sessions and then sees a psychiatrist. Patient states that she will be out of her welbutrin and klonopin in a few days. Patient requesting that Dr. Diona Foley give her another two week or one month prescription

## 2010-02-01 ENCOUNTER — Emergency Department (HOSPITAL_BASED_OUTPATIENT_CLINIC_OR_DEPARTMENT_OTHER)
Admission: RE | Admit: 2010-02-01 | Disposition: A | Discharge status: Home / Self Care | Payer: Self-pay | Source: Emergency Department | Attending: Physician Assistant | Admitting: Physician Assistant

## 2010-02-01 LAB — XR ANKLE LEFT MINIMUM 3 VIEWS

## 2010-02-01 LAB — XR FOOT LEFT MINIMUM 3 VIEWS

## 2010-02-01 MED ORDER — ACETAMINOPHEN 325 MG PO TABS
975.00 mg | ORAL_TABLET | Freq: Once | ORAL | Status: AC
Start: 2010-02-01 — End: 2010-02-01
  Administered 2010-02-01: 975 mg via ORAL
  Filled 2010-02-01: qty 3

## 2010-02-01 NOTE — ED Notes (Signed)
Pt fell last night at home. Today, c/o left ankle pain and swelling. Pain 9/10. +CSM.

## 2010-02-01 NOTE — Discharge Instructions (Signed)
Take Tylenol or Motrin 2 to 3 tablets every 6 hours as needed for pain. Keep elevated and apply ice pack to affected area to control swelling. Read discharge instructions given and follow up with your doctor as needed.     Ankle Sprain     An ankle sprain is an injury to the ligaments that hold the ankle joint together.         CAUSE  The injury is usually caused by a fall or by twisting the ankle. It is important to tell your caregiver how the injury occurred and whether or not you were able to walk immediately after the injury.      SYMPTOMS  Pain is the primary symptom. It may be present at rest or only when you are trying to stand or walk. The ankle will likely be swollen. Bruising may develop immediately or after 1 or 2 days. It may be difficult or impossible to stand or walk. This depends on the severity of the sprain.     DIAGNOSIS  Your caregiver can determine if a sprain has occurred based on the accident details and on examination of your ankle. Examination will include pressing and squeezing areas of the foot and ankle. Your caregiver will try to move the ankle in certain ways. X-rays may be used to be sure a bone was not broken, or that the ligament did not pull off of a bone (avulsion). There are standard guidelines that can reliably determine if an x-ray is needed.     RISKS AND COMPLICATIONS  A person who has sprained their ankle will be more prone to a repeat sprain. Long term pain with standing or walking or difficulty walking (chronic instability of the ankle) may result from an ankle sprain.     TREATMENT  Rest, ice, elevation, and compression are the basic modes of treatment (see home care instructions, below). Certain types of braces can help stabilize the ankle and allow early return to walking. Your caregiver can make a recommendation for this. Medication may be recommended for pain. You may be referred to an orthopedist or a physical therapist for certain types of severe sprains.     HOME  CARE INSTRUCTIONS  Ø Apply ice to the sore area for 15 to 20 minutes four times per day. Do this while you are awake for the first 2 days, or as directed. This can be stopped when the swelling goes away. Put the ice in a plastic bag and place a towel between the bag of ice and your skin.  Ø Keep your leg elevated when possible to lessen swelling.  Ø If your caregiver recommends crutches, use them as instructed with a non-weight bearing cast for 1 week. Then, you may walk on your ankle as the pain allows, or as instructed. Gradually, put weight on the affected ankle. Continue to use crutches or cane until you can walk without causing pain.  Ø If a plaster splint was applied, wear the splint until you are seen for a follow-up examination. Rest it on nothing harder than a pillow the first 24 hours. DO NOT put weight on it. DO NOT get it wet. You may take it off to take a shower or bath.   Ø You may have been given an elastic bandage to use with the plaster splint, or you may have been given a elastic bandage to use alone. The elastic bandage is too tight if you have numbness, tingling, or if your foot becomes   cold and blue. Adjust the bandage to make it comfortable.  Ø If an air splint was applied, you may blow more air into it or take some out to make it more comfortable. You may take it off at night and to take a shower or bath. Wiggle your toes in the splint several times per day if you are able.  Ø Only take over-the-counter or prescription medicines for pain, discomfort, or fever as directed by your caregiver.   Ø Do not drive a vehicle until your caregiver specifically tells you it is safe to do so.      SEEK MEDICAL CARE IF:  Ø You have an increase in bruising, swelling, or pain.  Ø You notice coldness of your toes.  Ø Pain relief is not achieved with medications.     SEEK IMMEDIATE MEDICAL CARE IF: your toes are numb or blue or you have severe pain.     MAKE SURE YOU:   Ø Understand these instructions.   Ø Will  watch your condition.  Ø Will get help right away if you are not doing well or get worse.     Document Released: 05/04/2007  Document Re-Released: 07/11/2008  Exit[employer]® Patient Information ©2010 [employer].

## 2010-02-02 ENCOUNTER — Telehealth (HOSPITAL_BASED_OUTPATIENT_CLINIC_OR_DEPARTMENT_OTHER): Payer: Self-pay | Admitting: Medical

## 2010-02-02 DIAGNOSIS — F411 Generalized anxiety disorder: Secondary | ICD-10-CM

## 2010-02-02 MED ORDER — CLONAZEPAM 1 MG PO TABS
1.0000 mg | ORAL_TABLET | Freq: Three times a day (TID) | ORAL | Status: DC | PRN
Start: 2010-02-02 — End: 2010-02-02

## 2010-02-02 MED ORDER — CLONAZEPAM 1 MG PO TABS
1.0000 mg | ORAL_TABLET | Freq: Two times a day (BID) | ORAL | Status: DC | PRN
Start: 2010-02-02 — End: 2010-02-12

## 2010-02-02 NOTE — Telephone Encounter (Addendum)
See telephone encounter.

## 2010-02-02 NOTE — Telephone Encounter (Addendum)
Addended by: Ree Kida on: 02/02/2010      Modules accepted: Orders

## 2010-02-02 NOTE — Telephone Encounter (Signed)
Paged by patient, states that she didn't receive klonopin prescription. Last appointment with Dr. Diona Foley was canceled because of a meeting, he knows that she is trying to get set up psychiatric care, and she understands that he doesn't feel comfortable prescribing these. She can't come into the office today to pick up the prescription- sprained her ankle yesterday and has no ride. Says her pharmacy will receive a fax. Reviewing the patient's chart looks like Dr. Diona Foley filled it on 01/31/10- but the patient wasn't contacted to pick it up. I will re-fill today, and fax over to her pharmacy.

## 2010-02-03 LAB — EMERGENCY ROOM NOTE

## 2010-02-07 ENCOUNTER — Other Ambulatory Visit (HOSPITAL_BASED_OUTPATIENT_CLINIC_OR_DEPARTMENT_OTHER): Payer: MEDICARE | Admitting: Urology

## 2010-02-11 ENCOUNTER — Other Ambulatory Visit (HOSPITAL_BASED_OUTPATIENT_CLINIC_OR_DEPARTMENT_OTHER): Payer: Self-pay

## 2010-02-11 NOTE — Telephone Encounter (Signed)
Message copied by Emelia Loron on Mon Feb 11, 2010  4:12 PM  ------       Message from: Sena Hitch, ADA A.       Created: Mon Feb 11, 2010  3:15 PM       Regarding: med problem         [facility]              Cheryl Dixon [id number], 54 year old, female, Telephone Information:       Home Phone      [phone]       Work Phone      [phone]       Mobile          Not on file.       Home Phone      Not on file.                     [name] NUMBER: [phone]       Best time to call back:        Cell phone:        Other phone:              Available times:              Patient's language of care: English              Patient does not need an interpreter.              Patient's PCP: Hildred Laser, MD              Person calling on behalf of patient: Patient (self)              Calls today with questions and concerns.clonazepam (KLONOPIN) 1 MG tablet,buPROPion (WELLBUTRIN SR) 200 MG 12 hr tablet  Wrong direction on taking medication               [facility]        [facility]              Phone: [phone] Fax: [phone]

## 2010-02-11 NOTE — Telephone Encounter (Signed)
called patient back, she reports pcp is filling psych meds for her until she sees new psych provider, she reports when clonazepam was filled it was not the dose she was taking before, she was taking it TID it was sent in BID.  Patient did not notice this and has been taking it TID as before, she will run out of med on Friday due to this. Patient has appt with provider on 10/26 but will need prescription prior to that. Advised her i would send message to provider.

## 2010-02-12 MED ORDER — CLONAZEPAM 1 MG PO TABS
1.0000 mg | ORAL_TABLET | Freq: Three times a day (TID) | ORAL | Status: DC | PRN
Start: 2010-02-12 — End: 2010-03-06

## 2010-02-12 NOTE — Telephone Encounter (Signed)
She is correct.  Rx refilled to last until her next appt with me on 10/26.  Prescription faxed to pharmacy.

## 2010-02-14 ENCOUNTER — Telehealth (HOSPITAL_BASED_OUTPATIENT_CLINIC_OR_DEPARTMENT_OTHER): Payer: Self-pay

## 2010-02-14 NOTE — Telephone Encounter (Signed)
Tied to call patient back, no answer, left message on voicemail to call clinic back.

## 2010-02-14 NOTE — Telephone Encounter (Signed)
Message copied by Emelia Loron on Thu Feb 14, 2010 11:22 AM  ------       Message from: Kathrin Penner       Created: Thu Feb 14, 2010 10:08 AM       Regarding: SPEAK TO NURSE        Contact: [phone]         [facility]              Allyson Tineo [id number], 54 year old, female, Telephone Information:       Home Phone      [phone]       Work Phone      [phone]       Mobile          Not on file.       Home Phone      Not on file.                     [name] NUMBER: [phone]                     Patient's language of care: English              Patient does not need an interpreter.              Patient's PCP: Hildred Laser, MD              Person calling on behalf of patient: Patient (self)              Calls today to speak to nurse only.              [facility]        [facility]              Phone: [phone] Fax: [phone]

## 2010-02-15 NOTE — Telephone Encounter (Signed)
Called pharmacy to determine what message was about, they needed clarificaton about clonazepam dose, as patient used last Rx too quickly, advised them per provider's notes that okay to fill, they will contact insurance for override.    Hildred Laser, MD 02/12/10 09:40 AM Signed   She is correct. Rx refilled to last until her next appt with me on 10/26. Prescription faxed to pharmacy.  Emelia Loron, RN 02/11/10 04:16 PM Signed   called patient back, she reports pcp is filling psych meds for her until she sees new psych provider, she reports when clonazepam was filled it was not the dose she was taking before, she was taking it TID it was sent in BID. Patient did not notice this and has been taking it TID as before, she will run out of med on Friday due to this. Patient has appt with provider on 10/26 but will need prescription prior to that. Advised her i would send message to provider.

## 2010-02-15 NOTE — Telephone Encounter (Signed)
Message copied by Emelia Loron on Fri Feb 15, 2010 11:40 AM  ------       Message from: Kathrin Penner       Created: Fri Feb 15, 2010 11:05 AM       Regarding: NEED RX TO BE  REWRITTEN.         [facility]              Cheryl Dixon [id number], 54 year old, female, Telephone Information:       Home Phone            Work Phone      [phone]       Mobile          Not on file.       Home Phone      Not on file.                     [name] NUMBER:  [phone]              Patient's language of care: English              Patient does not need an interpreter.              Patient's PCP: Hildred Laser, MD              Person calling on behalf of patient: Patient (self)              Calls today PLEASE CALL PHARMACY SO IT CAN BE REWRITTEN CORRECTLY SO SHE START TAKING MEDS ( clonazepam (KLONOPIN) 1 MG )              Patient's Preferred Pharmacy:        [facility]              Phone: [phone] Fax: [phone]

## 2010-02-20 ENCOUNTER — Encounter (HOSPITAL_BASED_OUTPATIENT_CLINIC_OR_DEPARTMENT_OTHER): Payer: MEDICARE | Admitting: Internal Medicine

## 2010-02-23 ENCOUNTER — Encounter (HOSPITAL_BASED_OUTPATIENT_CLINIC_OR_DEPARTMENT_OTHER): Payer: MEDICARE | Admitting: Internal Medicine

## 2010-03-06 ENCOUNTER — Ambulatory Visit (HOSPITAL_BASED_OUTPATIENT_CLINIC_OR_DEPARTMENT_OTHER): Payer: MEDICARE | Admitting: Internal Medicine

## 2010-03-06 VITALS — BP 120/80 | Temp 98.7°F

## 2010-03-06 DIAGNOSIS — R109 Unspecified abdominal pain: Secondary | ICD-10-CM

## 2010-03-06 DIAGNOSIS — Z1239 Encounter for other screening for malignant neoplasm of breast: Secondary | ICD-10-CM

## 2010-03-06 DIAGNOSIS — S92302A Fracture of unspecified metatarsal bone(s), left foot, initial encounter for closed fracture: Secondary | ICD-10-CM

## 2010-03-06 DIAGNOSIS — F411 Generalized anxiety disorder: Secondary | ICD-10-CM

## 2010-03-06 DIAGNOSIS — Z23 Encounter for immunization: Secondary | ICD-10-CM

## 2010-03-06 DIAGNOSIS — M79673 Pain in unspecified foot: Secondary | ICD-10-CM

## 2010-03-06 DIAGNOSIS — F1121 Opioid dependence, in remission: Secondary | ICD-10-CM

## 2010-03-06 DIAGNOSIS — R10A0 Flank pain, unspecified side: Secondary | ICD-10-CM

## 2010-03-06 DIAGNOSIS — M25569 Pain in unspecified knee: Secondary | ICD-10-CM

## 2010-03-06 LAB — URINE DIP (POINT OF CARE)
BILIRUBIN, URINE: NEGATIVE
GLUCOSE, URINE: NEGATIVE mg/dl
KETONE, URINE: NEGATIVE mg/dl
LEUKOCYTE ESTERASE: NEGATIVE
NITRITE, URINE: NEGATIVE
PH URINE: 5 (ref 5.0–8.0)
PROTEIN, URINE: NEGATIVE mg/dl (ref 0–15)
SPECIFIC GRAVITY URINE: 1.015 (ref 1.003–1.030)
UROBILINOGEN URINE: 0.2 mg/dl (ref 0.2–1.0)

## 2010-03-06 LAB — URINE DRUG SCREEN 7 DRUGS
AMPHETAMINES URINE: NEGATIVE
BARBITURATES URINE: NEGATIVE
BENZODIAZEPINES URINE: POSITIVE — AB
CANNABINOIDS URINE: POSITIVE — AB
COCAINE METABOLITES URINE: NEGATIVE
ETHANOL URINE: NEGATIVE
OPIATES URINE: NEGATIVE

## 2010-03-06 MED ORDER — CLONAZEPAM 1 MG PO TABS
1.0000 mg | ORAL_TABLET | Freq: Three times a day (TID) | ORAL | Status: DC | PRN
Start: 2010-03-06 — End: 2010-04-02

## 2010-03-06 MED ORDER — NAPROXEN 500 MG PO TABS
500.0000 mg | ORAL_TABLET | Freq: Two times a day (BID) | ORAL | Status: DC
Start: 2010-03-06 — End: 2010-04-24

## 2010-03-06 MED ORDER — BUPROPION HCL ER (SR) 200 MG PO TB12
200.0000 mg | ORAL_TABLET | Freq: Two times a day (BID) | ORAL | Status: DC
Start: 2010-03-06 — End: 2010-04-02

## 2010-03-06 NOTE — Progress Notes (Signed)
Influenza Vaccine Procedure  March 06, 2010    1. Has the patient received the information for the influenza vaccine? Yes    2. Does the patient have any of the following contraindications?  Allergy to eggs? No  Allergic reaction to previous influenza vaccines? No  Any other problems to previous influenza vaccines? No  Paralyzed by Guillain-Barre syndrome?  No  Current moderate or severe illness? No  Allergy to contact lens solution? No    3. The vaccine has been administered in the usual fashion.     Immunization information reviewed. Current VIS reviewed and given to patient/ guardian. Verbal assent obtained from patient/ guardian.  See immunization/Injection module or chart review for date of publication and additional information. Verbal assent obtained from patient/guardian. Comfort measures for possible side effects reviewed.

## 2010-03-06 NOTE — Progress Notes (Signed)
54 year old female seen in urgent care complaining of:    Left foot pain  Fell and twisted ankle about four weeks ago  Twisted left knee at the same time as well  Also complians of ingrown toenail    Left knee pain  Had cortisone injection by Dr. Maebelle Munroe in the past    Anxiety  Has tapered methadone down to 63 mg per day.  Tapering 2 mg every other week  Has seen the therapist twice  Needs to get back to third visit so that she can see prescriber  Feelings of anxiety are worse because she is tapering methadone    Concerned about UTI  No pain with urination  Only back pain, but that may be related to tapering dose of methadone  Primarily a crampy pain    BP 120/80   Temp(Src) 98.7 F (37.1 C) (Oral)  anxious  Regular rate and rhythm  Lungs clear  The abdomen is soft without tenderness, guarding, mass, rebound or organomegaly. Bowel sounds are normal. No CVA tenderness or inguinal adenopathy noted.  Left foot swelling over fifth metatarsal  Hammertoes bilateral 2nd toes  Bilateral great toe slightly ingrown nails    Assessment and plan:  729.5E Foot pain  (primary encounter diagnosis)  825.25AH Fracture of metatarsal bone of left foot  Hammertoes and ingrown nails  Comment:  Subacute fracture of left 5th metatarsal, as well as other chronic foot problems  Plan: REFERRAL TO PODIATRY (EXT), naproxen (NAPROSYN)        500 MG tablet             719.46D Knee pain  Comment: chronic problem. Had injection by Dr. Maebelle Munroe in the past  Plan: REFERRAL TO ORTHOPEDICS (EXT), naproxen         (NAPROSYN) 500 MG tablet             304.03 Opioid type dependence, in remission  Comment: making progress in tapering methadone, currently down to 62 mg but symptomatic  Plan: methadone (DOLOPHINE) 10 mg/mL solution             300.00 ANXIETY STATE NOS  Comment: patient is making progress at [facility], says she saw therapist twice, has trouble remembering appointments, needs to see therapist for a third time before she sees prescriber  Plan:  URINE DRUG SCREEN, 7 DRUGS, clonazepam         (KLONOPIN) 1 MG tablet, buPROPion (WELLBUTRIN         SR) 200 MG 12 hr tablet        Reviewed that I will refill for a month but cannot continue to prescribe indefinitely as she needs to have regular psychaitric care    V76.10K Screening for breast cancer  Comment:    Plan: ORDER FOR MAMMOGRAM (SCREENING)             V04.81 Flu Vaccine Given  Comment:    Plan: IMMUNIZATION ADMIN SINGLE, RN, INFLUENZA VIRUS         VACCINE SPLIT VIRUS 3 YEARS + IM             789.00C Flank pain  Comment: doubt UTI    Plan: URINE DIP (POINT OF CARE)        Trace blood only, no evidence of infection

## 2010-03-15 ENCOUNTER — Encounter (HOSPITAL_BASED_OUTPATIENT_CLINIC_OR_DEPARTMENT_OTHER): Payer: Self-pay | Admitting: Internal Medicine

## 2010-03-15 NOTE — Progress Notes (Signed)
.    Returned Pt's call regarding PT1  Request two forms were submitted electronically  Podiatry Dr Melida Gimenez Tracking [id number]  [facility] Tracking [phone] [phone]

## 2010-03-19 ENCOUNTER — Telehealth (HOSPITAL_BASED_OUTPATIENT_CLINIC_OR_DEPARTMENT_OTHER): Payer: Self-pay | Admitting: Internal Medicine

## 2010-03-19 NOTE — Telephone Encounter (Signed)
Called Patient to in form of Podiatry Appointment on 04/02/10 at 8:45. Left Message to return Call. Spoke with Patient and Confirmed appointment

## 2010-03-28 ENCOUNTER — Telehealth (HOSPITAL_BASED_OUTPATIENT_CLINIC_OR_DEPARTMENT_OTHER): Payer: Self-pay | Admitting: Internal Medicine

## 2010-03-28 NOTE — Telephone Encounter (Signed)
Patient scheduled for Ortho appt on 04/02/10 st 11am with Dr Maebelle Munroe in [facility].

## 2010-04-02 ENCOUNTER — Other Ambulatory Visit (HOSPITAL_BASED_OUTPATIENT_CLINIC_OR_DEPARTMENT_OTHER): Payer: Self-pay | Admitting: Registered Nurse

## 2010-04-02 DIAGNOSIS — F411 Generalized anxiety disorder: Secondary | ICD-10-CM

## 2010-04-02 MED ORDER — BUPROPION HCL ER (SR) 200 MG PO TB12
200.0000 mg | ORAL_TABLET | Freq: Two times a day (BID) | ORAL | Status: DC
Start: 2010-04-02 — End: 2010-04-24

## 2010-04-02 MED ORDER — CLONAZEPAM 1 MG PO TABS
1.0000 mg | ORAL_TABLET | Freq: Three times a day (TID) | ORAL | Status: DC | PRN
Start: 2010-04-02 — End: 2010-04-24

## 2010-04-02 NOTE — Telephone Encounter (Signed)
[facility]    Cheryl Dixon [id number], 54 year old, female, Telephone Information:  Home Phone [phone]  Work Phone [phone]  Mobile Not on file.  Home Phone Not on file.    [facility]    Person calling on behalf of patient: Patient (self) also wanted to inform Dr. Diona Foley taht she is still looking for a psych doctor    May list multiple medications in this section  Medicine Name: clonazepam (KLONOPIN)   Dosage: 1 MG tablet   Frequency (how many pills, how many times a day)Take 1 tablet by mouth 3 (three) times daily as needed for Anxiety.  Number of pills left:   Documented patient preferred pharmacies:  [facility]    Phone: [phone] Fax: [phone]    [facility]        May list multiple medications in this section  Medicine Name: buPROPion [facility] SR  Dosage: 200 MG 12 hr tablet   Frequency (how many pills, how many times a day): Take 1 tablet by mouth 2 (two) times daily.   Number of pills left:   Documented patient preferred pharmacies:  [name]    Phone: [phone] Fax: [phone]

## 2010-04-02 NOTE — Telephone Encounter (Signed)
Larita Fife -- Please let patient know that I have refilled clonazepam for 2 weeks only.  Further refills need to come from her psychiatrist.  If she wants to discuss she should make appt to see me.

## 2010-04-10 ENCOUNTER — Encounter (HOSPITAL_COMMUNITY)
Admission: RE | Admit: 2010-04-10 | Discharge: 2010-05-28 | Payer: Self-pay | Source: Home / Self Care | Attending: Rheumatology | Admitting: Rheumatology

## 2010-04-12 ENCOUNTER — Encounter (HOSPITAL_BASED_OUTPATIENT_CLINIC_OR_DEPARTMENT_OTHER): Payer: MEDICARE | Admitting: Internal Medicine

## 2010-04-24 ENCOUNTER — Other Ambulatory Visit (HOSPITAL_BASED_OUTPATIENT_CLINIC_OR_DEPARTMENT_OTHER): Payer: Self-pay | Admitting: Internal Medicine

## 2010-04-24 DIAGNOSIS — M79673 Pain in unspecified foot: Secondary | ICD-10-CM

## 2010-04-24 DIAGNOSIS — M25569 Pain in unspecified knee: Secondary | ICD-10-CM

## 2010-04-24 DIAGNOSIS — F411 Generalized anxiety disorder: Secondary | ICD-10-CM

## 2010-04-24 MED ORDER — BUPROPION HCL ER (SR) 200 MG PO TB12
200.0000 mg | ORAL_TABLET | Freq: Two times a day (BID) | ORAL | Status: DC
Start: 2010-04-24 — End: 2010-06-20

## 2010-04-24 MED ORDER — CLONAZEPAM 1 MG PO TABS
1.0000 mg | ORAL_TABLET | Freq: Three times a day (TID) | ORAL | Status: DC | PRN
Start: 2010-04-24 — End: 2010-05-20

## 2010-04-24 MED ORDER — NAPROXEN 500 MG PO TABS
500.0000 mg | ORAL_TABLET | Freq: Two times a day (BID) | ORAL | Status: DC
Start: 2010-04-24 — End: 2010-06-20

## 2010-04-24 NOTE — Telephone Encounter (Signed)
Message copied by Wolfgang Phoenix on Wed Apr 24, 2010  4:44 PM  ------       Message from: Kathrin Penner       Created: Wed Apr 24, 2010  3:11 PM       Regarding: REFILL       Contact: [phone]         [facility]              Person calling on behalf of patient: Patient (self)              May list multiple medications in this section       Medicine Name: clonazepam (KLONOPIN) 1 MG ,naproxen (NAPROSYN) 500 MG ,buPROPion (WELLBUTRIN SR) 200 MG               Documented patient preferred pharmacies:       [facility]              Phone: [phone] Fax: [phone]              [name] NUMBER: [phone]              Patient's language of care: English              Patient does not need an interpreter.

## 2010-04-24 NOTE — Telephone Encounter (Signed)
Cheryl Dixon is a 54 year old female has requested a refill of Bupropion; clonazepam and Naproxen.  Other Med Adult:  Most Recent BP Reading(s)  03/06/10 : 120/80        Cholesterol (mg/dl)   Date     Date  Value    01/04/2010  264   ----------    LDL (mg/dl)   Date     Date  Value    01/04/2010  101*   ----------    HDL (mg/dl)   Date     Date  Value    01/04/2010  38    ----------    No results found for this basename: tg:1        No results found for this basename: TSHSC:1        No results found for this basename: TSH:1      HEMOGLOBIN A1C (%)   Date     Date  Value    01/04/2010  5.8    ----------        INR (no units)   Date     Date  Value    02/16/2007  1.0*    07/03/2006  < 1.0*   ----------       Documented patient preferred pharmacies:  [name] [place]: [phone] Fax: [phone]

## 2010-04-24 NOTE — Telephone Encounter (Signed)
Prescription sent electronically to preferred pharmacy (wellbutrin and naproxen)    Prescription printed (clonazepam). It will be signed and placed at the front desk within 24 hours for the patient to pick up.    Patients who need to pick up prescriptions at the office should be reminded to call at least three days before they run out of medication so that there is adequate time to process the request.

## 2010-05-01 ENCOUNTER — Telehealth (HOSPITAL_BASED_OUTPATIENT_CLINIC_OR_DEPARTMENT_OTHER): Payer: Self-pay | Admitting: Internal Medicine

## 2010-05-01 NOTE — Telephone Encounter (Signed)
Returned call Patient requesting 3 PT1 Forms the following were submitted electronically  Dr Diona Foley Tracking [phone]  [facility]  Tracking [id number]  [facility] Tracking [phone]

## 2010-05-08 ENCOUNTER — Encounter (HOSPITAL_BASED_OUTPATIENT_CLINIC_OR_DEPARTMENT_OTHER): Payer: MEDICARE | Admitting: Internal Medicine

## 2010-05-18 ENCOUNTER — Encounter: Payer: Self-pay | Admitting: Endocrinology

## 2010-05-19 ENCOUNTER — Encounter: Payer: Self-pay | Admitting: Endocrinology

## 2010-05-20 ENCOUNTER — Other Ambulatory Visit (HOSPITAL_BASED_OUTPATIENT_CLINIC_OR_DEPARTMENT_OTHER): Payer: Self-pay | Admitting: Ambulatory Care

## 2010-05-20 ENCOUNTER — Other Ambulatory Visit (HOSPITAL_BASED_OUTPATIENT_CLINIC_OR_DEPARTMENT_OTHER): Payer: Self-pay | Admitting: Internal Medicine

## 2010-05-20 DIAGNOSIS — F411 Generalized anxiety disorder: Secondary | ICD-10-CM

## 2010-05-20 MED ORDER — CLONAZEPAM 1 MG PO TABS
1.0000 mg | ORAL_TABLET | Freq: Three times a day (TID) | ORAL | Status: AC | PRN
Start: 2008-05-24 — End: 2008-06-23

## 2010-05-20 NOTE — Telephone Encounter (Signed)
Cheryl Dixon is a 55 year old female has requested a refill of Wellbutrin.  Other Med Adult:  Most Recent BP Reading(s)  03/06/10 : 120/80        Cholesterol (mg/dl)   Date     Date  Value    01/04/2010  264*   ----------    LDL (mg/dl)   Date     Date  Value    01/04/2010  101*   ----------    HDL (mg/dl)   Date     Date  Value    01/04/2010  38    ----------    No results found for this basename: tg:1        No results found for this basename: TSHSC:1        No results found for this basename: TSH:1      HEMOGLOBIN A1C (%)   Date     Date  Value    01/04/2010  5.8    ----------        INR (no units)   Date     Date  Value    02/16/2007  1.0*    07/03/2006  < 1.0*   ----------       Documented patient preferred pharmacies:  [name]: [phone] Fax: [phone]

## 2010-05-20 NOTE — Telephone Encounter (Signed)
Cheryl Dixon -- Please let patient know that she should have refills to last until next appt with me and psychiatrist.  Also needs to pick up clonazepam rx rather than having it mailed to her (clinic policy not to mailed controlled substance Rx).

## 2010-05-20 NOTE — Telephone Encounter (Signed)
Called patient, advised her of message below. She has already used refill of clonazepam, she last picked it up 10 days ago and only has a couple of days supply left. Patient would like to pick up clonazepam prescription tomorrow, she will contact pharmacy for wellbutrin-she reports she didn't check for a refill.

## 2010-05-20 NOTE — Telephone Encounter (Signed)
I spoke with the patient. Patient is aware that her prescription refills are not due for 3 days. Patient is asking for refills of Wellbutrin and klonopin.     Patient is asking that the medications be mailed to her since it is very difficult for her to reach the clinic.   Patient has an appointment with Dr. Diona Foley on 06/03/10 and an appointment with Dr. Ferd Hibbs on 06/07/10

## 2010-05-20 NOTE — Telephone Encounter (Signed)
Prescription printed. It will be signed and placed at the front desk within 24 hours for the patient to pick up.  It is our clinic policy not to mail prescriptions.    Patients who need to pick up prescriptions at the office should be reminded to call at least three days before they run out of medication so that there is adequate time to process the request.

## 2010-05-20 NOTE — Telephone Encounter (Signed)
Message copied by Arta Bruce on Mon May 20, 2010  4:28 PM  ------       Message from: [name]       Created: Mon May 20, 2010  4:11 PM       Regarding: medication ???'s       Contact: [phone]         [facility]              Sarina Robleto [id number], 55 year old, female, Telephone Information:       Home Phone      [phone]       Work Phone      [phone]       Mobile          Not on file.       Home Phone      Not on file.                     [name] NUMBER: [phone]       Best time to call back:        Cell phone:        Other phone:              Available times:              Patient's language of care: English              Patient does not need an interpreter.              Patient's PCP: Hildred Laser, MD              Person calling on behalf of patient: Patient (self)              Calls today with questions and concerns.medication ???'s       Patient's Preferred Pharmacy:        [facility]              Phone: [phone] Fax: [phone]

## 2010-05-20 NOTE — Telephone Encounter (Signed)
Message copied by Evangeline Gula on Mon May 20, 2010 10:06 AM  ------       Message from: [name]       Created: Fri May 17, 2010  2:27 PM       Regarding: med refills   pt would like hard copies sent to home         [facility]              Person calling on behalf of patient: Patient (self)              May list multiple medications in this section       Medicine Name: wellbutrim       Dosage: 200 mg       Frequency (how many pills, how many times a day): 1 tab x's 2 daily       Number of pills left:               Klonopin       1 mg       1 tab x's 3 daily as needed for anxiety                            Documented patient preferred pharmacies:       [facility]              Phone: [phone] Fax: [phone]

## 2010-05-21 MED ORDER — CLONAZEPAM 1 MG PO TABS
1.0000 mg | ORAL_TABLET | Freq: Three times a day (TID) | ORAL | Status: DC | PRN
Start: 2010-05-20 — End: 2010-06-24

## 2010-05-27 ENCOUNTER — Encounter: Payer: Self-pay | Admitting: Internal Medicine

## 2010-06-03 ENCOUNTER — Encounter (HOSPITAL_BASED_OUTPATIENT_CLINIC_OR_DEPARTMENT_OTHER): Payer: MEDICARE | Admitting: Internal Medicine

## 2010-06-04 ENCOUNTER — Telehealth (HOSPITAL_BASED_OUTPATIENT_CLINIC_OR_DEPARTMENT_OTHER): Payer: Self-pay

## 2010-06-04 NOTE — Telephone Encounter (Signed)
Message copied by Emelia Loron on Tue Jun 04, 2010  1:26 PM  ------       Message from: Hilliard Clark A.       Created: Tue Jun 04, 2010  9:29 AM       Regarding: requesting a return call from RN no info provided       Contact: [phone]         [facility]              Tishana Clinkenbeard [id number], 55 year old, female, Telephone Information:       Home Phone      [phone]       Work Phone      [phone]       Mobile          Not on file.       Home Phone      Not on file.                     [name] NUMBER: [phone]              Patient's language of care: English              Patient does not need an interpreter.              Patient's PCP: Hildred Laser, MD              Person calling on behalf of patient: Patient (self)              Calls today requesting a return call from RN no info provided              Patient's Preferred Pharmacy:        [facility]              Phone: [phone] Fax: [phone]

## 2010-06-04 NOTE — Telephone Encounter (Signed)
Called patient back, she reports she missed appt with provider yesterday, she could not leave her house, she reports she got dressed, ride cam, but she was unable to go. Patient reports an increase in anxiety and depression recently, she is crying on the phone. Patient not sure what has precipitated this, she continues to take medications as prescribed, she reports she is afraid something will happen at home if she leaves-she reports there is no reason for this since no one is home. She also reports she is self conscious about missing her front teeth-her denture broke and she cannot afford to fix it. Patient denies any suicidal thoughts, she thought she had appt with Dr. Ferd Hibbs on Friday-advised her this has been rescheduled to march 23. Patient reports sh has missed a lot of appointments recently-she even missed 3 methadone appts last week. Advised patient she should come in to be seen, patient agreed to come in next week, appt scheduled 2/15 with PCP. Advised patient to call clinic back with any further issues/concerns, she agreed.

## 2010-06-05 ENCOUNTER — Encounter (HOSPITAL_COMMUNITY): Payer: 59 | Attending: Rheumatology

## 2010-06-05 DIAGNOSIS — M069 Rheumatoid arthritis, unspecified: Secondary | ICD-10-CM | POA: Insufficient documentation

## 2010-06-07 ENCOUNTER — Ambulatory Visit (HOSPITAL_BASED_OUTPATIENT_CLINIC_OR_DEPARTMENT_OTHER): Payer: MEDICARE | Admitting: Psychiatry

## 2010-06-11 ENCOUNTER — Telehealth (HOSPITAL_BASED_OUTPATIENT_CLINIC_OR_DEPARTMENT_OTHER): Payer: Self-pay | Admitting: Internal Medicine

## 2010-06-12 ENCOUNTER — Encounter (HOSPITAL_BASED_OUTPATIENT_CLINIC_OR_DEPARTMENT_OTHER): Payer: MEDICARE | Admitting: Internal Medicine

## 2010-06-14 ENCOUNTER — Other Ambulatory Visit: Payer: Self-pay | Admitting: Gastroenterology

## 2010-06-20 ENCOUNTER — Other Ambulatory Visit (HOSPITAL_BASED_OUTPATIENT_CLINIC_OR_DEPARTMENT_OTHER): Payer: Self-pay | Admitting: Internal Medicine

## 2010-06-20 DIAGNOSIS — M79673 Pain in unspecified foot: Secondary | ICD-10-CM

## 2010-06-20 DIAGNOSIS — F411 Generalized anxiety disorder: Secondary | ICD-10-CM

## 2010-06-20 DIAGNOSIS — M25569 Pain in unspecified knee: Secondary | ICD-10-CM

## 2010-06-20 NOTE — Telephone Encounter (Signed)
Cheryl Dixon is a 55 year old female has requested a refill of Clonazepam, Wellbutrin, and Naproxen.  Clonazepam was last filled on 05/21/2010 with 1 refill.  Please review  Other Med Adult:  Most Recent BP Reading(s)  03/06/10 : 120/80        Cholesterol (mg/dl)   Date     Date  Value    01/04/2010  264*   ----------    LDL (mg/dl)   Date     Date  Value    01/04/2010  101*   ----------    HDL (mg/dl)   Date     Date  Value    01/04/2010  38    ----------    No results found for this basename: tg:1        No results found for this basename: TSHSC:1        No results found for this basename: TSH:1      HEMOGLOBIN A1C (%)   Date     Date  Value    01/04/2010  5.8    ----------        INR (no units)   Date     Date  Value    02/16/2007  1.0*    07/03/2006  < 1.0*   ----------       Documented patient preferred pharmacies:  [name]: [phone] Fax: [phone]

## 2010-06-20 NOTE — Telephone Encounter (Signed)
Message copied by Stann Ore on Thu Jun 20, 2010  3:26 PM  ------       Message from: [name]       Created: Thu Jun 20, 2010  2:27 PM       Regarding: refill         [facility]              Cheryl Dixon [id number], 55 year old, female, Telephone Information:       Home Phone      [phone]       Work Phone      [phone]       Mobile          Not on file.       Home Phone      Not on file.              clonazepam (KLONOPIN) 1 MG tablet [[id number]] ENDED  Order Details            Dose: 1 mg Route: Oral Frequency: 3 TIMES DAILY PRN for Anxiety          Dispense Quantity:  45 tablet Refills:  1 Fills Remaining:  1                            Sig: Take 1 tablet by mouth 3 (three) times daily as needed for Anxiety.                          Written Date:  05/21/10 Expiration Date:  --              Start Date:  05/20/10 End Date:  06/19/10                buPROPion (WELLBUTRIN SR) 200 MG 12 hr tablet [[id number]]  Order Details            Dose: 200 mg Route: Oral Frequency: 2 TIMES DAILY          Dispense Quantity:  60 tablet Refills:  1 Fills Remaining:  1                            Sig: Take 1 tablet by mouth 2 (two) times daily.                          Written Date:  04/24/10 Expiration Date:  --              Start Date:  04/24/10 End Date:  06/23/10         naproxen (NAPROSYN) 500 MG tablet [[id number]]  Order Details            Dose: 500 mg Route: Oral Frequency: 2 TIMES DAILY WITH MEALS          Dispense Quantity:  60 tablet Refills:  1 Fills Remaining:  1                            Sig: Take 1 tablet by mouth 2 (two) times daily with meals.                          Written Date:  04/24/10 Expiration Date:  --  Start Date:  04/24/10 End Date:  06/23/10                       [facility]              Person calling on behalf of patient: Patient (self)              May list multiple medications in this section       Medicine Name: clonazepam (KLONOPIN) 1 MG       Dosage: 1  mg       Frequency Sig: Take 1 tablet by mouth 3 (three) times daily as needed for Anxiety.       Number of pills left: na       Documented patient preferred pharmacies:       [facility]              Phone: [phone] Fax: [phone]              [facility]              Person calling on behalf of patient: Patient (self)              May list multiple medications in this section       Medicine Name: buPROPion (WELLBUTRIN SR) 200 MG        Dosage: 200 mg       Frequency  Sig: Take 1 tablet by mouth 2 (two) times daily.        Number of pills left: na       Documented patient preferred pharmacies:       [facility]              Phone: [phone] Fax: [phone]              [facility]              Person calling on behalf of patient: Patient (self)              May list multiple medications in this section       Medicine Name:  naproxen (NAPROSYN) 500 MG        Dosage: 500 mg       FrequencyFrequency Sig: Take 1 tablet by mouth 3 (three) times daily as needed for Anxiety.       Number of pills left: na       Documented patient preferred pharmacies:       [name] [address]              Phone: [phone] Fax: [phone]

## 2010-06-21 ENCOUNTER — Telehealth (HOSPITAL_BASED_OUTPATIENT_CLINIC_OR_DEPARTMENT_OTHER): Payer: Self-pay | Admitting: Registered Nurse

## 2010-06-21 MED ORDER — BUPROPION HCL ER (SR) 200 MG PO TB12
200.0000 mg | ORAL_TABLET | Freq: Two times a day (BID) | ORAL | Status: DC
Start: 2010-06-20 — End: 2010-07-19

## 2010-06-21 MED ORDER — NAPROXEN 500 MG PO TABS
500.0000 mg | ORAL_TABLET | Freq: Two times a day (BID) | ORAL | Status: DC
Start: 2010-06-20 — End: 2010-08-19

## 2010-06-21 NOTE — Telephone Encounter (Signed)
Klonopin was 1 fill in January and 1 fill in February and not due for another fill until end of March.  That rx is refused.  Other rx's sent.

## 2010-06-21 NOTE — Telephone Encounter (Signed)
Called and spoke with pt ,  She is requesting a refill on her Clonazepam until 07/21/10  Pt  Does have an appt with psychiatry on 07/21/10  Upstairs from [facility] encounter was opened in error.  Please disregard.

## 2010-06-21 NOTE — Telephone Encounter (Signed)
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  Home Phone Not on file.      [name] NUMBER: [phone]  Best time to call back:   Cell phone:   Other phone:    Available times:    Patient's language of care: English    Patient does not need an interpreter.    Patient's PCP: Hildred Laser, MD    Person calling on behalf of patient: Patient (self)    Calls today for med refill(s). meds not at pharmacy klonapin script has been filled 'xs 2 since last script written, pt stats called in on tues 06-18-10 but i just see one for yesterday 06-20-10    Patient's Preferred Pharmacy:   [facility]    Phone: [phone] Fax: [phone]     Refill request already in process

## 2010-06-21 NOTE — Telephone Encounter (Signed)
Message copied by Donette Larry on Fri Jun 21, 2010  3:13 PM  ------       Message from: [name]       Created: Fri Jun 21, 2010  3:08 PM       Regarding: meds not at pharmacy  klonapin script has been filled 'xs 2 since last script written, pt stats called in on tues 06-18-10 but i just see one for yesterday 06-20-10       Contact: [phone]         [facility]              Tuwanna Krausz [id number], 55 year old, female, Telephone Information:       Home Phone      [phone]       Work Phone      [phone]       Mobile          Not on file.       Home Phone      Not on file.                     [name] NUMBER: [phone]       Best time to call back:        Cell phone:        Other phone:              Available times:              Patient's language of care: English              Patient does not need an interpreter.              Patient's PCP: Hildred Laser, MD              Person calling on behalf of patient: Patient (self)              Calls today for med refill(s).  meds not at pharmacy  klonapin script has been filled 'xs 2 since last script written, pt stats called in on tues 06-18-10 but i just see one for yesterday 06-20-10              Patient's Preferred Pharmacy:        [facility] [address]              Phone: [phone] Fax: [phone]

## 2010-06-24 ENCOUNTER — Encounter (HOSPITAL_BASED_OUTPATIENT_CLINIC_OR_DEPARTMENT_OTHER): Payer: Self-pay | Admitting: Internal Medicine

## 2010-06-24 ENCOUNTER — Other Ambulatory Visit (HOSPITAL_BASED_OUTPATIENT_CLINIC_OR_DEPARTMENT_OTHER): Payer: Self-pay | Admitting: Internal Medicine

## 2010-06-24 DIAGNOSIS — F411 Generalized anxiety disorder: Secondary | ICD-10-CM

## 2010-06-24 MED ORDER — CLONAZEPAM 1 MG PO TABS
1.0000 mg | ORAL_TABLET | Freq: Three times a day (TID) | ORAL | Status: DC | PRN
Start: 2010-06-24 — End: 2010-07-19

## 2010-06-24 NOTE — Progress Notes (Signed)
.    RX Clonazepam dated 1955-08-09. Shredded.

## 2010-06-24 NOTE — Telephone Encounter (Signed)
Prescription printed. It will be signed and placed at the front desk within 24 hours for the patient to pick up.    Patients who need to pick up prescriptions at the office should be reminded to call at least three days before they run out of medication so that there is adequate time to process the request.

## 2010-06-24 NOTE — Telephone Encounter (Signed)
Cheryl Dixon is a 55 year old female has requested a refill of Clonazepam.  Other Med Adult:  Most Recent BP Reading(s)  03/06/10 : 120/80        Cholesterol (mg/dl)   Date     Date  Value    01/04/2010  264*   ----------    LDL (mg/dl)   Date     Date  Value    01/04/2010  101*   ----------    HDL (mg/dl)   Date     Date  Value    01/04/2010  38    ----------    No results found for this basename: tg:1        No results found for this basename: TSHSC:1        No results found for this basename: TSH:1      HEMOGLOBIN A1C (%)   Date     Date  Value    01/04/2010  5.8    ----------        INR (no units)   Date     Date  Value    02/16/2007  1.0*    07/03/2006  < 1.0*   ----------       Documented patient preferred pharmacies:  [name] [address]: [phone] Fax: [phone]

## 2010-06-24 NOTE — Telephone Encounter (Signed)
Message copied by Evangeline Gula on Mon Jun 24, 2010 12:15 PM  ------       Message from: [name]       Created: Mon Jun 24, 2010 11:56 AM       Regarding: klonopin refill    PT IS ALL OUT         [facility]              Person calling on behalf of patient: Patient (self)              May list multiple medications in this section       Medicine Name: Scarlette Calico       Dosage: 1 MG       Frequency (how many pills, how many times a day): 1 TAB X'S 3 DAILY AS NEEDED       Number of pills left: 0       Documented patient preferred pharmacies:       [name] [address]              Phone: [phone] Fax: [phone]

## 2010-06-26 ENCOUNTER — Ambulatory Visit: Payer: 59 | Admitting: Physical Therapy

## 2010-06-26 ENCOUNTER — Ambulatory Visit: Payer: 59 | Attending: Rheumatology | Admitting: Occupational Therapy

## 2010-06-26 DIAGNOSIS — M2560 Stiffness of unspecified joint, not elsewhere classified: Secondary | ICD-10-CM | POA: Insufficient documentation

## 2010-06-26 DIAGNOSIS — R269 Unspecified abnormalities of gait and mobility: Secondary | ICD-10-CM | POA: Insufficient documentation

## 2010-06-26 DIAGNOSIS — Z5189 Encounter for other specified aftercare: Secondary | ICD-10-CM | POA: Insufficient documentation

## 2010-06-26 DIAGNOSIS — M6281 Muscle weakness (generalized): Secondary | ICD-10-CM | POA: Insufficient documentation

## 2010-06-26 DIAGNOSIS — M2550 Pain in unspecified joint: Secondary | ICD-10-CM | POA: Insufficient documentation

## 2010-07-05 ENCOUNTER — Ambulatory Visit: Payer: 59 | Attending: Rheumatology | Admitting: Physical Therapy

## 2010-07-05 ENCOUNTER — Ambulatory Visit: Payer: 59 | Admitting: Occupational Therapy

## 2010-07-05 DIAGNOSIS — R269 Unspecified abnormalities of gait and mobility: Secondary | ICD-10-CM | POA: Insufficient documentation

## 2010-07-05 DIAGNOSIS — Z5189 Encounter for other specified aftercare: Secondary | ICD-10-CM | POA: Insufficient documentation

## 2010-07-05 DIAGNOSIS — M6281 Muscle weakness (generalized): Secondary | ICD-10-CM | POA: Insufficient documentation

## 2010-07-05 DIAGNOSIS — M2550 Pain in unspecified joint: Secondary | ICD-10-CM | POA: Insufficient documentation

## 2010-07-05 DIAGNOSIS — M2560 Stiffness of unspecified joint, not elsewhere classified: Secondary | ICD-10-CM | POA: Insufficient documentation

## 2010-07-09 ENCOUNTER — Telehealth (HOSPITAL_BASED_OUTPATIENT_CLINIC_OR_DEPARTMENT_OTHER): Payer: Self-pay | Admitting: Internal Medicine

## 2010-07-09 ENCOUNTER — Ambulatory Visit: Payer: 59 | Admitting: Physical Therapy

## 2010-07-09 ENCOUNTER — Encounter: Payer: 59 | Admitting: Occupational Therapy

## 2010-07-09 NOTE — Telephone Encounter (Signed)
Spoke with patient calling to schedule Mammogram she asked this be scheduled in April. I have scheduled Mammogram 08/02/2010 @ 1:00 [facility], left message on voicemail as she requested.

## 2010-07-12 ENCOUNTER — Ambulatory Visit: Payer: 59 | Admitting: Physical Therapy

## 2010-07-12 ENCOUNTER — Ambulatory Visit: Payer: 59 | Admitting: Occupational Therapy

## 2010-07-16 ENCOUNTER — Ambulatory Visit: Payer: 59 | Admitting: Physical Therapy

## 2010-07-16 ENCOUNTER — Encounter: Payer: 59 | Admitting: Occupational Therapy

## 2010-07-19 ENCOUNTER — Encounter (HOSPITAL_BASED_OUTPATIENT_CLINIC_OR_DEPARTMENT_OTHER): Payer: Self-pay | Admitting: Psychiatry

## 2010-07-19 ENCOUNTER — Ambulatory Visit (HOSPITAL_BASED_OUTPATIENT_CLINIC_OR_DEPARTMENT_OTHER): Payer: MEDICARE | Admitting: Psychiatry

## 2010-07-19 ENCOUNTER — Ambulatory Visit: Payer: 59 | Admitting: Occupational Therapy

## 2010-07-19 ENCOUNTER — Ambulatory Visit: Payer: 59 | Admitting: Physical Therapy

## 2010-07-19 ENCOUNTER — Encounter: Payer: 59 | Admitting: Occupational Therapy

## 2010-07-19 DIAGNOSIS — F339 Major depressive disorder, recurrent, unspecified: Secondary | ICD-10-CM

## 2010-07-19 DIAGNOSIS — F411 Generalized anxiety disorder: Secondary | ICD-10-CM

## 2010-07-19 MED ORDER — CLONAZEPAM 1 MG PO TABS
1.0000 mg | ORAL_TABLET | Freq: Two times a day (BID) | ORAL | Status: DC | PRN
Start: 2010-08-16 — End: 2010-09-13

## 2010-07-19 MED ORDER — CLONAZEPAM 1 MG PO TABS
ORAL_TABLET | ORAL | Status: DC
Start: 2010-07-19 — End: 2010-07-19

## 2010-07-19 MED ORDER — BUPROPION HCL ER (SR) 150 MG PO TB12
ORAL_TABLET | ORAL | Status: DC
Start: 2010-07-19 — End: 2010-08-19
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
## 2010-07-23 ENCOUNTER — Encounter: Payer: 59 | Admitting: Occupational Therapy

## 2010-07-23 ENCOUNTER — Ambulatory Visit: Payer: 59 | Admitting: Physical Therapy

## 2010-07-24 ENCOUNTER — Ambulatory Visit: Payer: 59 | Admitting: Occupational Therapy

## 2010-07-24 ENCOUNTER — Ambulatory Visit: Payer: 59 | Admitting: Physical Therapy

## 2010-07-31 ENCOUNTER — Encounter (HOSPITAL_COMMUNITY): Payer: 59 | Attending: Rheumatology

## 2010-07-31 DIAGNOSIS — M069 Rheumatoid arthritis, unspecified: Secondary | ICD-10-CM | POA: Insufficient documentation

## 2010-08-02 ENCOUNTER — Ambulatory Visit: Payer: Self-pay | Admitting: Internal Medicine

## 2010-08-08 ENCOUNTER — Ambulatory Visit: Payer: 59 | Admitting: Physical Therapy

## 2010-08-08 ENCOUNTER — Encounter: Payer: 59 | Admitting: Occupational Therapy

## 2010-08-14 ENCOUNTER — Ambulatory Visit: Payer: 59 | Attending: Rheumatology | Admitting: Physical Therapy

## 2010-08-14 ENCOUNTER — Ambulatory Visit: Payer: 59 | Admitting: Occupational Therapy

## 2010-08-14 DIAGNOSIS — M6281 Muscle weakness (generalized): Secondary | ICD-10-CM | POA: Insufficient documentation

## 2010-08-14 DIAGNOSIS — R269 Unspecified abnormalities of gait and mobility: Secondary | ICD-10-CM | POA: Insufficient documentation

## 2010-08-14 DIAGNOSIS — Z5189 Encounter for other specified aftercare: Secondary | ICD-10-CM | POA: Insufficient documentation

## 2010-08-14 DIAGNOSIS — M2560 Stiffness of unspecified joint, not elsewhere classified: Secondary | ICD-10-CM | POA: Insufficient documentation

## 2010-08-14 DIAGNOSIS — M2550 Pain in unspecified joint: Secondary | ICD-10-CM | POA: Insufficient documentation

## 2010-08-20 ENCOUNTER — Telehealth (HOSPITAL_BASED_OUTPATIENT_CLINIC_OR_DEPARTMENT_OTHER): Payer: Self-pay | Admitting: Internal Medicine

## 2010-08-27 ENCOUNTER — Encounter (HOSPITAL_BASED_OUTPATIENT_CLINIC_OR_DEPARTMENT_OTHER): Payer: MEDICARE | Admitting: Psychiatry

## 2010-09-11 ENCOUNTER — Encounter (HOSPITAL_COMMUNITY): Payer: 59

## 2010-09-13 ENCOUNTER — Ambulatory Visit (HOSPITAL_BASED_OUTPATIENT_CLINIC_OR_DEPARTMENT_OTHER): Payer: MEDICARE | Admitting: Psychiatry

## 2010-09-13 DIAGNOSIS — F1121 Opioid dependence, in remission: Secondary | ICD-10-CM

## 2010-09-13 DIAGNOSIS — F32A Depression, unspecified: Secondary | ICD-10-CM

## 2010-09-13 DIAGNOSIS — F329 Major depressive disorder, single episode, unspecified: Secondary | ICD-10-CM

## 2010-09-13 DIAGNOSIS — F17200 Nicotine dependence, unspecified, uncomplicated: Secondary | ICD-10-CM

## 2010-09-13 DIAGNOSIS — F411 Generalized anxiety disorder: Secondary | ICD-10-CM

## 2010-09-13 MED ORDER — VARENICLINE TARTRATE 0.5 MG X 11 & 1 MG X 42 PO MISC
ORAL | Status: DC
Start: 2010-09-13 — End: 2010-11-09

## 2010-09-13 MED ORDER — CLONAZEPAM 0.5 MG PO TABS
0.5000 mg | ORAL_TABLET | Freq: Three times a day (TID) | ORAL | Status: DC | PRN
Start: 2010-09-13 — End: 2010-10-11

## 2010-09-13 MED ORDER — BUPROPION HCL ER (XL) 300 MG PO TB24
300.0000 mg | ORAL_TABLET | Freq: Every morning | ORAL | Status: DC
Start: 2010-09-13 — End: 2010-11-12

## 2010-09-13 MED ORDER — PROPRANOLOL HCL 40 MG PO TABS
40.0000 mg | ORAL_TABLET | Freq: Two times a day (BID) | ORAL | Status: DC | PRN
Start: 2010-09-13 — End: 2010-10-11
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
## 2010-09-13 NOTE — Op Note (Signed)
NAMEKIRSTINE, JACQUIN                 ACCOUNT NO.:  [id number]   MEDICAL RECORD NO.:  [id number]          PATIENT TYPE:  OIB   LOCATION:  5743                         FACILITY:  [facility]   PHYSICIAN:  Leonie Man, M.D.   DATE OF BIRTH:  02/11/1956   DATE OF PROCEDURE:  04/09/2005  DATE OF DISCHARGE:  04/10/2005                                 OPERATIVE REPORT   PREOPERATIVE DIAGNOSIS:  Left thyroid nodule, rule out carcinoma.   POSTOPERATIVE DIAGNOSIS:  Follicular lesion, left thyroid -- pathology  pending.   PROCEDURE:  Left thyroid lobectomy and isthmusectomy.   SURGEON:  Leonie Man, M.D.   ASSISTANT:  Ollen Gross. Carolynne Edouard, M.D.   ANESTHESIA:  General.   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 She has had previous biopsies which showed  basically follicular epithelium and a background of colloid with chronic  inflammation and some thyroiditis. Because of the continued growth of this  lesion, the patient comes to the operating room now for left-sided thyroid  lobectomy and the possibility of total thyroidectomy. She comes  understanding the risks and potential benefits of surgery, including the  risks of her recurrent laryngeal nerve injury and hypoparathyroidism. She  understands these risks and gives her consent.   PROCEDURE:  Following the induction of satisfactory general anesthesia, the  patient was positioned supinely with the head and neck slightly  hyperextended. The neck was then prepped and draped to be included in the  sterile operative field. A transverse incision was carried down  approximately two fingerbreadths above the sternal notch; it was then  deepened through the skin and subcutaneous tissue and across the platysma  muscle. A superior flap was raised up to the thyroid cartilage, and an  inferior flap carried down to the  sternal notch. The midline was located; it  was somewhat displaced towards the right.  This was opened up and the  dissection carried down between the strap muscles and down to the thyroid  capsule.  Dissection was carried over to the left, exposing a very large  left thyroid lobe. Dissection was carried up toward the superior pole, with  isolation of the superior pole and control of the superior thyroid vessels  with 2-0 silk ties and with medium clips. The thyroid was then mobilized  down along its service to the lower pole; at which point the middle thyroid  vein was isolated, clipped and transected. The recurrent laryngeal nerve was  dissected and noted throughout the course of the dissection. The thyroid was  then dissected from the left neck, preserving both the lower and upper  parathyroid glands. As the dissection continued medially, the thyroid was  dissected free from the trachea.  With the isthmus it was carried over to  the origin of the right lobe. At this point, it was crossclamped with a  straight clamp and transected; and 4 different pathologic evaluation. Frozen  section diagnosis showed follicular lesion of the thyroid, with  2 separate  nodules.  A definitive diagnosis of carcinoma could not be made at this  point. The edge of the right thyroid nodule was then secured with suture  ligatures of  3-0 Vicryl. All areas of the dissection within the neck were  then checked for hemostasis, and noted to be dry.  I then placed a small  patch of Surgicel in the left neck for additional hemostasis. Sponge and  instrument counts were then verified.  The midline strap muscles were then  closed with interrupted sutures of 3-0 Vicryl. The  platysma muscle was closed with interrupted 3-0 Vicryl. The skin was closed  with running 5-0 Monocryl suture. This was then reinforced with Steri-Strips  and a sterile dressing applied. Anesthetic was reversed. The patient was  removed from the  operating room to the recovery room in stable condition.  She tolerated the procedure well.      Leonie Man, M.D.  Electronically Signed     PB/MEDQ  D:  04/09/2005  T:  04/10/2005  Job:  [id number]

## 2010-09-13 NOTE — Op Note (Signed)
NAME:  Michelle Roberts, Michelle Roberts                 ACCOUNT NO.:  [id number]   MEDICAL RECORD NO.:  [id number]          PATIENT TYPE:  OIB   LOCATION:  2550                         FACILITY:  [facility]   PHYSICIAN:  Leonie Man, M.D.   DATE OF BIRTH:  09/06/1955   DATE OF PROCEDURE:  05/29/2005  DATE OF DISCHARGE:  03/07/2005                                 OPERATIVE REPORT   PREOPERATIVE DIAGNOSIS:  Papillary carcinoma of the thyroid, status post  left thyroid lobectomy and isthmusectomy.   POSTOPERATIVE DIAGNOSIS:  Papillary carcinoma of the thyroid, status post  left thyroid lobectomy and isthmusectomy.   PROCEDURE:  Completion right thyroidectomy.   ASSISTANT:  Anselm Pancoast. Zachery Dakins, M.D.   ANESTHESIA:  General.   SPECIMENS TO THE LAB:  Right thyroid lobe.   ESTIMATED BLOOD LOSS:  Minimal.   COMPLICATIONS:  None.   The patient returned to the PACU in good condition.   NOTE:  Ms. Michelle Roberts is a 55 year old patient who underwent right-sided  thyroid lobectomy and isthmusectomy in December 2006, on frozen section  noted to have what was felt to be follicular lesion; however, on permanent  section noted to have a papillary carcinoma of the thyroid.  The patient  returns now for completion thyroidectomy after risks and potential benefits  of surgery been discussed. All questions answered and consent obtained.   PROCEDURE:  Following the induction of satisfactory general anesthesia, the  patient is positioned supinely, head and neck hyperextended, and the neck is  prepped and draped to be included in the sterile operative field. Transverse  collar incision made through the old scar cicatrix, deepened through skin,  subcutaneous tissues across the platysma muscle. Superior flaps raised up to  the thyroid cartilage and inferior flap raised down to the sternal notch.  The midline was located, and the midline strap muscles were opened up and  dissection carried over onto the right side,  exposing the right lobe of the  thyroid.  The right lobe of thyroid was dissected down from the superior  pole, where the superior pole vessels were isolated and suture ligated with  2-0 silk sutures. The remainder of the upper pole was taken down between  medium-size clips. Attention was then turned to the remainder of the thyroid  lobe, locating and middle thyroid vein and transecting this between clips.  Dissection carried down into the lower pole. The recurrent laryngeal nerve  is located as we dissected under the thyroid, and this was protected  throughout the course of the dissection. The thyroid was then dissected free  from off of the recurrent nerve, carrying the dissection superiorly and  medially, protecting the right lower pole parathyroid gland out. I did not  see a right upper pole parathyroid gland. The thyroid was then released from  the anterior portion of the trachea, removed and forwarded for pathologic  evaluation. Hemostasis was ensured where appropriate with electrocautery,  and the wound thoroughly irrigated with normal saline.  I placed a Surgicel  patch over the area of dissection for additional hemostasis. Sponge,  instrument and sharp counts were  verified. The incision was closed as  follows. The midline strap muscles were closed with interrupted figure-of-  eight 3-0 Vicryl sutures. The platysma muscle was closed with interrupted 3-  0 Vicryl sutures. The skin was closed with 5-0 Monocryl suture and then  reinforced with Steri-Strips. A sterile dressing was applied. Anesthetic  reversed. The patient was removed from the operating room to the recovery  room in stable condition. She tolerated the procedure well.      Leonie Man, M.D.  Electronically Signed     PB/MEDQ  D:  05/29/2005  T:  05/29/2005  Job:  [id number]   cc:   Leonie Man, M.D.  [address]  [address]  [place]  [place] [address]

## 2010-09-17 ENCOUNTER — Ambulatory Visit (HOSPITAL_BASED_OUTPATIENT_CLINIC_OR_DEPARTMENT_OTHER): Payer: MEDICARE | Admitting: Physician Assistant

## 2010-09-17 ENCOUNTER — Telehealth (HOSPITAL_BASED_OUTPATIENT_CLINIC_OR_DEPARTMENT_OTHER): Payer: Self-pay | Admitting: Psychiatry

## 2010-09-17 VITALS — BP 130/100 | HR 71 | Temp 98.8°F | Wt 201.0 lb

## 2010-09-17 DIAGNOSIS — J069 Acute upper respiratory infection, unspecified: Secondary | ICD-10-CM

## 2010-09-17 DIAGNOSIS — W57XXXA Bitten or stung by nonvenomous insect and other nonvenomous arthropods, initial encounter: Secondary | ICD-10-CM

## 2010-09-17 MED ORDER — HYDROXYZINE HCL 25 MG PO TABS
25.00 mg | ORAL_TABLET | Freq: Four times a day (QID) | ORAL | Status: AC | PRN
Start: 2010-09-17 — End: 2010-09-27

## 2010-09-17 MED ORDER — PSEUDOEPHEDRINE HCL 30 MG PO TABS
30.0000 mg | ORAL_TABLET | Freq: Four times a day (QID) | ORAL | Status: DC | PRN
Start: 2010-09-17 — End: 2010-09-26

## 2010-09-17 MED ORDER — HYDROCORTISONE 1 % EX CREA
TOPICAL_CREAM | Freq: Two times a day (BID) | CUTANEOUS | Status: DC
Start: 2010-09-17 — End: 2010-09-26

## 2010-09-17 NOTE — Telephone Encounter (Signed)
Message copied by Threasa Heads on Tue Sep 17, 2010  5:08 PM  ------       Message from: Sharyne Richters       Created: Tue Sep 17, 2010  2:21 PM       Regarding: PA         [facility]              Cheryl Dixon [id number], 55 year old, female        Patient's PCP: Hildred Laser, MD       Person calling on behalf of patient: [facility]              Calls today regarding:        Prior authorization        varenicline (CHANTIX STARTING MONTH PAK) 0.5 MG X 11 & 1 MG X 42 tablet [[id number]]                     Patient's preferred pharmacy: [name] [address]              Phone: [phone] Fax: [phone]

## 2010-09-17 NOTE — Progress Notes (Signed)
HPI Comments: Cheryl Dixon is a 55 year old female    Pt presents for bug bites on her arms and legs x 1 week.  She is not sure if this is bed bugs or something from the yard or coming from screen.  They are red bumps and itchy.  She sleeps in t shirt and shorts.  Exposed areas are the only areas affected.  She has not tried anything otc for this.  She had bed bugs yrs before and got rid of all her furniture and had exterminator and such.  Nobody else in house with similar.      She also complains of uri s/s x 1 day.  She feels SOB, fatigued, congested.      Also would like to quit smoking.    Review of Systems   Constitutional: Positive for malaise/fatigue. Negative for fever.   HENT: Positive for congestion.    Respiratory: Positive for shortness of breath.    Cardiovascular: Negative for chest pain.   Gastrointestinal: Negative for nausea, vomiting, abdominal pain, diarrhea and constipation.   Skin: Positive for rash.   Neurological: Positive for headaches. Negative for dizziness.     Physical Exam   Constitutional: She appears well-developed and well-nourished.   HENT:   Right Ear: External ear normal.   Left Ear: External ear normal.   Mouth/Throat: Oropharynx is clear and moist.   Cardiovascular: Normal rate, regular rhythm and normal heart sounds.    Pulmonary/Chest: Effort normal and breath sounds normal. No respiratory distress. She has no wheezes.   Lymphadenopathy:     She has no cervical adenopathy.   Skin:        Several small red papules on forearms sporadic, a couple on each leg, none noted on back, stomach, face and upper thighs.  No white "head".  A few are scabbed over from scratching.  No noted vesicles or ulcerations or drainage.         879.8CL Bite from insect  (primary encounter diagnosis)  Comment: unsure if bed bug or some outside bug  Plan: hydrOXYzine (ATARAX) 25 MG tablet,         hydrocortisone 1 % cream            465.9X URI, acute  Comment: viral  Plan: pseudoephedrine (SUDAFED) 30 MG  tablet,         hydrOXYzine (ATARAX) 25 MG tablet

## 2010-09-18 NOTE — Telephone Encounter (Signed)
Prior authorization request for Chantix  Clinical Indication: smoking cessation  Previous medications tried: nicotine patch, nicotine gum, Wellbutrin  Diagnostic testing information: extensive evaluation and treatment

## 2010-09-18 NOTE — Telephone Encounter (Signed)
Please fill out the following information and route to the Central Refill Pool.      Prior authorization request for   Clinical Indication:   Previous medications tried:   Diagnostic testing information:

## 2010-09-19 NOTE — Telephone Encounter (Signed)
PRIOR AUTHORIZATION FOR CHANTIX    Patient has [employer] for prescription coverage.  I.D.#: [id number]    Filled out Prior Authorization Request form. Scanned form into Epic under Careers information officer (PA for Chantix).    Please print out form to be reviewed, signed, and faxed to the insurance company.

## 2010-09-24 ENCOUNTER — Telehealth (HOSPITAL_BASED_OUTPATIENT_CLINIC_OR_DEPARTMENT_OTHER): Payer: Self-pay

## 2010-09-24 NOTE — Telephone Encounter (Signed)
Message copied by Emelia Loron on Tue Sep 24, 2010  6:27 PM  ------       Message from: Sharyne Richters       Created: Tue Sep 24, 2010  3:59 PM       Regarding: scabies         [facility]              Cheryl Dixon [id number], 55 year old, female        Patient's PCP: Hildred Laser, MD       Person calling on behalf of patient: Patient (self)              Calls today regarding:        Pt wants to be seen for possible Scabies. In the morning       Roommate has been dx with scabies,she has them on her arms.               Letter type:               Telephone Information:       Home Phone      [phone]       Work Phone      [phone]       Mobile          Not on file.       Home Phone      Not on file.              Best number to call back: Home Phone [phone] (home)              Okay to leave voice message? no              Patient's language of care: English              Patient requires interpreter? NO              Patient's preferred pharmacy: [facility]              Phone: [phone] Fax: [phone]

## 2010-09-24 NOTE — Telephone Encounter (Signed)
Called patient back, she reports she has a rash on her arms and abdomen, she has had this a few days-saw Dionna on 5/22-? Bug bites. She reports her son was just diagnosed with scabies and is on medication, she does not think her rash looks the same but is concerned and would like to see provider. She asks for early am appt-advised her I have no openings, appt made at 1:15 with Dr. Norina Buzzard.    Patient then asks to report to supervisor that she is very unhappy with person answering phone this afternoon, she reports she asked for appt asap and was advised there were none-she then reports she asked to speak with patty (she helped her last week with separate issue) and was advised she did not have "special powers". Advised patient that there were no openings and proper procedure is for message to be sent to RN's for urgent appts which is what was doen, she asked i forward message.

## 2010-09-25 ENCOUNTER — Ambulatory Visit (HOSPITAL_BASED_OUTPATIENT_CLINIC_OR_DEPARTMENT_OTHER): Payer: MEDICARE | Admitting: Internal Medicine

## 2010-09-25 ENCOUNTER — Telehealth (HOSPITAL_BASED_OUTPATIENT_CLINIC_OR_DEPARTMENT_OTHER): Payer: Self-pay | Admitting: Ambulatory Care

## 2010-09-25 ENCOUNTER — Encounter (HOSPITAL_COMMUNITY): Payer: 59 | Attending: Rheumatology

## 2010-09-25 DIAGNOSIS — M069 Rheumatoid arthritis, unspecified: Secondary | ICD-10-CM | POA: Insufficient documentation

## 2010-09-25 NOTE — Telephone Encounter (Signed)
I left a message on the patient's V/M to return call to the clinic

## 2010-09-26 ENCOUNTER — Ambulatory Visit (HOSPITAL_BASED_OUTPATIENT_CLINIC_OR_DEPARTMENT_OTHER): Payer: MEDICARE | Admitting: Physician Assistant

## 2010-09-26 VITALS — BP 130/80 | HR 78 | Wt 200.6 lb

## 2010-09-26 DIAGNOSIS — W57XXXA Bitten or stung by nonvenomous insect and other nonvenomous arthropods, initial encounter: Secondary | ICD-10-CM

## 2010-09-26 MED ORDER — HYDROCORTISONE 1 % EX CREA
TOPICAL_CREAM | Freq: Two times a day (BID) | CUTANEOUS | Status: AC
Start: 2010-09-26 — End: 2010-10-06

## 2010-09-26 NOTE — Progress Notes (Signed)
Cheryl Dixon is a 55 year old female with the following Problems and Medications.    Patient Active Problem List:     ANXIETY STATE NOS [300.00]     OPIOID DEPENDENCE-REMISS [304.03]     TOBACCO USE DISORDER [305.1]     DPH-CARE COORDINATION PROGRAM PARTICIPANT [[id number]]     Fibrocystic breast [610.1AC]     Depression [311F]      Current outpatient prescriptions:  pseudoephedrine (SUDAFED) 30 MG tablet Take 1 tablet by mouth every 6 (six) hours as needed for Congestion. Disp: 30 tablet Rfl: 0   hydrOXYzine (ATARAX) 25 MG tablet Take 1 tablet by mouth every 6 (six) hours as needed for Itching. Disp: 30 tablet Rfl: 0   hydrocortisone 1 % cream Apply  topically 2 (two) times daily. Disp: 1 tube Rfl: 0   propranolol (INDERAL) 40 MG tablet Take 1 tablet by mouth 2 (two) times daily as needed (anxiety). Disp: 60 tablet Rfl: 0   buPROPion (WELLBUTRIN XL) 300 MG 24 hr tablet Take 1 tablet by mouth every morning. Disp: 30 tablet Rfl: 1   clonazepam (KLONOPIN) 0.5 MG tablet Take 1 tablet by mouth 3 (three) times daily as needed for Anxiety. Disp: 90 tablet Rfl: 0   varenicline (CHANTIX STARTING MONTH PAK) 0.5 MG X 11 & 1 MG X 42 tablet Take  by mouth. 0.5mg  daily for 3 days, then 0.5mg  twice a day for 4 days, then 1mg  twice a day Disp: 53 tablet Rfl: 0   methadone (DOLOPHINE) 10 mg/mL solution Take  by mouth. 62 mg 11/11 Disp: 1 mL Rfl: 0   albuterol (PROAIR HFA) 108 (90 BASE) MCG/ACT inhaler Inhale 2 puffs into the lungs every 6 (six) hours as needed for Shortness of breath (cough) for 7 days. Disp: 1 Inhaler Rfl: 0   DOCUSATE SODIUM 100 MG OR CAPS 2 CAPSULES DAILY Disp: 60 Rfl: 11       Review of Patient's Allergies indicates:   Meperidine hcl              Comment:Nausea/vomit   Darvon                      Pt presents for rash  She is concerned about scabies because her son was recently treated for scabies. "My daughter in law keeps saying I have scabies."  Rash is on her arms and two red spots on her abd  She has no rash in  the webs of her hands and feet or other body areas  Started 10 days ago-feels it is getting better  Was extremely itchy-now resolved  Was seen 09/17/10 in this clinic by Janalyn Rouse, PA-C for same issue-Dx'd w/insect bites and tx'd w/hydrocortisone cream and hydroxyzine-Both provided relief.   She denies new soap, detergent, lotions  She denies contact with animals  She denies allergies to food or medication   Has happened before in the spring-thinks it was insect bites. After spraying windows and cleaning room, red spots disappeared.   Has also had bedbug a few years ago-She got rid of her bedroom furniture and bought new furniture   Denies new soap, detergent, medication.        Review of Systems   Constitutional: Negative for fever.   Respiratory: Negative for shortness of breath.    Cardiovascular: Negative for chest pain.   Skin: Positive for rash (arms and abd). Negative for itching.   All other systems reviewed and are negative.  Physical Exam   Nursing note and vitals reviewed.  Constitutional: She appears well-developed and well-nourished.   Cardiovascular: Normal rate, regular rhythm and normal heart sounds.    Pulmonary/Chest: Effort normal and breath sounds normal.   Neurological: She is alert.   Skin: Skin is warm and dry.        Multiple small erythematous papules scattered around the elbow, forearm, and a few on the LLL and abd. There is scabbing secondary to scratching.  Non-tender. No drainage, no vesicles, no ulcers, no rash,    Psychiatric: She has a normal mood and affect.     HM-Due for Mammogram  -She plans to schedule her own appt     919.4L Insect bite  (primary encounter diagnosis)  Comment: Pt reassured she does not have scabies, she should continue to use hydrocortisone if it is helping, avoid scratching   Plan: hydrocortisone 1 % cream

## 2010-09-30 ENCOUNTER — Telehealth (HOSPITAL_BASED_OUTPATIENT_CLINIC_OR_DEPARTMENT_OTHER): Payer: Self-pay

## 2010-09-30 NOTE — Telephone Encounter (Signed)
Message copied by Sherrine Maples on Mon Sep 30, 2010 11:32 AM  ------       Message from: Kathrin Penner       Created: Mon Sep 30, 2010 10:15 AM       Regarding: speak to nurse       Contact: [phone]         [facility]              Cheryl Dixon [id number], 55 year old, female, Telephone Information:       Home Phone      [phone]       Work Phone      [phone]       Mobile          Not on file.       Home Phone      Not on file.                     [name] NUMBER: [phone]              Patient's language of care: English              Patient does not need an interpreter.              Patient's PCP: Hildred Laser, MD              Person calling on behalf of patient: Patient (self)              Calls today to speak to nurse only.              Patient's Preferred Pharmacy:        [facility]              Phone: [phone] Fax: [phone]

## 2010-09-30 NOTE — Telephone Encounter (Signed)
Spoke with pt who was last seen 5/31, second appt for rash on her arms, legs, feet. Hydrocortisone is helping with itch but she is getting more bites, would like to come in tomorrow am for another evaluation. Appt scheduled with Dr.Carman for 8:45am

## 2010-10-01 ENCOUNTER — Ambulatory Visit (HOSPITAL_BASED_OUTPATIENT_CLINIC_OR_DEPARTMENT_OTHER): Payer: MEDICARE | Admitting: Emergency Medicine

## 2010-10-01 ENCOUNTER — Telehealth (HOSPITAL_BASED_OUTPATIENT_CLINIC_OR_DEPARTMENT_OTHER): Payer: Self-pay | Admitting: Internal Medicine

## 2010-10-01 ENCOUNTER — Encounter (HOSPITAL_BASED_OUTPATIENT_CLINIC_OR_DEPARTMENT_OTHER): Payer: Self-pay | Admitting: Emergency Medicine

## 2010-10-01 VITALS — BP 170/120 | HR 71 | Temp 97.3°F

## 2010-10-01 DIAGNOSIS — I8390 Asymptomatic varicose veins of unspecified lower extremity: Secondary | ICD-10-CM

## 2010-10-01 DIAGNOSIS — F1121 Opioid dependence, in remission: Secondary | ICD-10-CM

## 2010-10-01 DIAGNOSIS — Z2089 Contact with and (suspected) exposure to other communicable diseases: Principal | ICD-10-CM

## 2010-10-01 DIAGNOSIS — Z1239 Encounter for other screening for malignant neoplasm of breast: Secondary | ICD-10-CM

## 2010-10-01 DIAGNOSIS — I10 Essential (primary) hypertension: Secondary | ICD-10-CM | POA: Insufficient documentation

## 2010-10-01 DIAGNOSIS — IMO0001 Reserved for inherently not codable concepts without codable children: Secondary | ICD-10-CM

## 2010-10-01 DIAGNOSIS — R030 Elevated blood-pressure reading, without diagnosis of hypertension: Secondary | ICD-10-CM | POA: Insufficient documentation

## 2010-10-01 DIAGNOSIS — Z207 Contact with and (suspected) exposure to pediculosis, acariasis and other infestations: Secondary | ICD-10-CM

## 2010-10-01 MED ORDER — PERMETHRIN 5 % EX CREA
TOPICAL_CREAM | Freq: Once | CUTANEOUS | Status: AC
Start: 2010-10-01 — End: 2010-10-01

## 2010-10-01 MED ORDER — LISINOPRIL 5 MG PO TABS
5.0000 mg | ORAL_TABLET | Freq: Every day | ORAL | Status: DC
Start: 2010-10-01 — End: 2010-12-20

## 2010-10-01 NOTE — Telephone Encounter (Signed)
Cheryl Dixon -- could you please book a follow up with me for BP check sometime within the next week?

## 2010-10-01 NOTE — Progress Notes (Signed)
Cheryl Dixon is a 55 year old female  Rash    Ongoing rash x - ongoing for 2-3 weeks.  Son was treated for scabies.  C/o itching on lateral aspect of feet  - on hands and near elbows  Very itchy.  Has lesions on arms - has felt sting - and is aware of insect bites.    Is off klonopin x 3 days - believes ex husband took them from the house.  Is being tapered by DR Miovic.  "I'm not asking for more medication".  Given #90 on May 18th 2012 by Dr Ferd Hibbs.  Plan was taper by 0.5 mg per month.  BP is elevated today - + headache.  No prior hx of HTN  Has SOB with exertion - I know it is from my cigarettes.    Had surgery on right lower leg by Dr Wellington Hampshire for varicose veins -   Would like to have surgery on left lower leg.  Referral done.    Overdue for mammogram - willing to go on same day as vascular appt.  Order done.          Physical Exam   Nursing note and vitals reviewed.  Constitutional: She is oriented to person, place, and time. She appears well-developed and well-nourished. She appears distressed (tearful at times.  + emotional distress).        Left BP 160/110Right BP 148/ 100   Cardiovascular: Normal rate and regular rhythm.    Pulmonary/Chest: Effort normal and breath sounds normal.   Abdominal: Soft. No tenderness. She has no rebound.   Musculoskeletal:        + varicose veins.   Neurological: She is alert and oriented to person, place, and time.   Skin: Skin is warm and dry. Rash noted.     ASSESSMENT/PLAN:  V01.89V Scabies exposure  (primary encounter diagnosis)  Comment: will treat - with one refill; not classical distribution - some look like bug bites.  Plan: permethrin (ELIMITE) 5 % cream - one refill          304.03 Opioid type dependence, in remission  Comment: med recon  Plan: methadone (DOLOPHINE) 10 mg/mL solution          454.9E Varicose veins  Comment: Dr Wellington Hampshire  Plan: REFERRAL TO VASCULAR SURGERY ( INT)          V76.10K Screening for breast cancer  Plan: ORDER FOR MAMMOGRAM (SCREENING)          796.57M  Elevated BP  Comment: d/x PCP - diff dx for elevated BP - is benzo withdrawal, emotional upset - will start lisinopril - Dr Diona Foley will decide about klonopin and schedule f/u appt.  Plan: lisinopril (PRINIVIL,ZESTRIL) 5 MG tablet

## 2010-10-04 NOTE — Telephone Encounter (Signed)
Called home# lm to call office -needs appt with PCP for BP check & referral

## 2010-10-07 ENCOUNTER — Telehealth (HOSPITAL_BASED_OUTPATIENT_CLINIC_OR_DEPARTMENT_OTHER): Payer: Self-pay | Admitting: Internal Medicine

## 2010-10-08 ENCOUNTER — Ambulatory Visit (HOSPITAL_BASED_OUTPATIENT_CLINIC_OR_DEPARTMENT_OTHER): Payer: MEDICARE | Admitting: Surgery

## 2010-10-08 ENCOUNTER — Ambulatory Visit: Payer: Self-pay | Admitting: Emergency Medicine

## 2010-10-09 ENCOUNTER — Encounter (HOSPITAL_BASED_OUTPATIENT_CLINIC_OR_DEPARTMENT_OTHER): Payer: Self-pay | Admitting: Psychiatry

## 2010-10-11 ENCOUNTER — Telehealth (HOSPITAL_BASED_OUTPATIENT_CLINIC_OR_DEPARTMENT_OTHER): Payer: Self-pay | Admitting: Psychiatry

## 2010-10-11 ENCOUNTER — Other Ambulatory Visit (HOSPITAL_BASED_OUTPATIENT_CLINIC_OR_DEPARTMENT_OTHER): Payer: Self-pay | Admitting: Internal Medicine

## 2010-10-11 DIAGNOSIS — F411 Generalized anxiety disorder: Secondary | ICD-10-CM

## 2010-10-11 DIAGNOSIS — F32A Depression, unspecified: Secondary | ICD-10-CM

## 2010-10-11 DIAGNOSIS — F329 Major depressive disorder, single episode, unspecified: Secondary | ICD-10-CM

## 2010-10-11 MED ORDER — PROPRANOLOL HCL 40 MG PO TABS
40.0000 mg | ORAL_TABLET | Freq: Two times a day (BID) | ORAL | Status: DC | PRN
Start: 2010-10-11 — End: 2010-11-12

## 2010-10-11 MED ORDER — CLONAZEPAM 0.5 MG PO TABS
0.5000 mg | ORAL_TABLET | Freq: Two times a day (BID) | ORAL | Status: DC | PRN
Start: 2010-10-11 — End: 2010-10-11

## 2010-10-11 MED ORDER — CLONAZEPAM 0.5 MG PO TABS
0.5000 mg | ORAL_TABLET | Freq: Two times a day (BID) | ORAL | Status: DC | PRN
Start: 2010-10-11 — End: 2010-11-12

## 2010-10-11 NOTE — Telephone Encounter (Signed)
Message copied by Threasa Heads on Fri Oct 11, 2010 11:29 AM  ------       Message from: Hilliard Clark A.       Created: Fri Oct 11, 2010  9:40 AM       Regarding: Refill       Contact: [phone]         [facility]              Person calling on behalf of patient: Patient (self)              May list multiple medications in this section       Medicine Name: clonazepam (KLONOPIN) 0.5 MG        Number of pills left: 0              Requesting for this request bo be processed Urgently and the RX to be Faxed to [place].              Documented patient preferred pharmacies:       [facility]       Phone: [phone] Fax: [phone]              Patient's language of care: English              Patient does not need an interpreter.

## 2010-10-11 NOTE — Telephone Encounter (Signed)
re-faxed

## 2010-10-11 NOTE — Telephone Encounter (Signed)
Needs med refill.  Klonopin wean per my last note.

## 2010-10-11 NOTE — Telephone Encounter (Signed)
Pharmacy did not get Klonopin, refaxed

## 2010-10-15 ENCOUNTER — Ambulatory Visit (HOSPITAL_BASED_OUTPATIENT_CLINIC_OR_DEPARTMENT_OTHER): Payer: MEDICARE | Admitting: Surgery

## 2010-10-16 ENCOUNTER — Ambulatory Visit (HOSPITAL_BASED_OUTPATIENT_CLINIC_OR_DEPARTMENT_OTHER): Payer: MEDICARE

## 2010-10-16 DIAGNOSIS — Z1211 Encounter for screening for malignant neoplasm of colon: Secondary | ICD-10-CM

## 2010-10-16 LAB — FECAL OCCULT (POINT OF CARE) HOME TEST 3 CARDS
OCCULT BLOOD: NEGATIVE
OCCULT BLOOD: NEGATIVE
OCCULT BLOOD: NEGATIVE

## 2010-10-18 NOTE — Progress Notes (Signed)
Quick Note:    Please send letter:    Stool cards are negative for blood. We should repeat this test in 1 year to detect colon cancer early.    ______

## 2010-11-01 ENCOUNTER — Telehealth (HOSPITAL_BASED_OUTPATIENT_CLINIC_OR_DEPARTMENT_OTHER): Payer: Self-pay

## 2010-11-01 NOTE — Telephone Encounter (Signed)
Message copied by Emelia Loron on Fri Nov 01, 2010  8:49 AM  ------       Message from: [name]       Created: Thu Oct 31, 2010 11:39 AM       Regarding: Disibilty paper on the way  from [facility] wanted to let Dr know         [facility]              Cheryl Dixon [id number], 55 year old, female, Telephone Information:       Home Phone      [phone]       Work Phone      [phone]       Mobile          Not on file.       Home Phone      Not on file.                     [name] NUMBER:    [phone]              Best time to call back:        Cell phone:        Other phone:              Available times:              Patient's language of care: English              Patient does not need an interpreter.              Patient's PCP: Hildred Laser, MD              Person calling on behalf of patient: Patient (self)       Disibilty paper on the way  from [facility] wanted to let Dr know       Calls today               Patient's Preferred Pharmacy:        [facility]              Phone: [phone] Fax: [phone]

## 2010-11-01 NOTE — Telephone Encounter (Signed)
Patient calling to advise provider that social security paperwork will be coming, message forwarded to provider.

## 2010-11-01 NOTE — Telephone Encounter (Signed)
Called patient, she has paperwork, it is to be returned in 10 days, appt scheduled next week with provider.

## 2010-11-01 NOTE — Telephone Encounter (Signed)
Please advise her to schedule an appointment for completion of this paperwork.

## 2010-11-06 ENCOUNTER — Encounter (HOSPITAL_BASED_OUTPATIENT_CLINIC_OR_DEPARTMENT_OTHER): Payer: Self-pay | Admitting: Internal Medicine

## 2010-11-06 ENCOUNTER — Ambulatory Visit (HOSPITAL_BASED_OUTPATIENT_CLINIC_OR_DEPARTMENT_OTHER): Payer: MEDICARE | Admitting: Internal Medicine

## 2010-11-06 ENCOUNTER — Telehealth (HOSPITAL_BASED_OUTPATIENT_CLINIC_OR_DEPARTMENT_OTHER): Payer: Self-pay | Admitting: Internal Medicine

## 2010-11-06 VITALS — BP 130/86 | HR 55 | Temp 97.3°F | Wt 198.0 lb

## 2010-11-06 DIAGNOSIS — M1710 Unilateral primary osteoarthritis, unspecified knee: Principal | ICD-10-CM

## 2010-11-06 DIAGNOSIS — F17200 Nicotine dependence, unspecified, uncomplicated: Secondary | ICD-10-CM

## 2010-11-06 DIAGNOSIS — M545 Low back pain, unspecified: Secondary | ICD-10-CM | POA: Insufficient documentation

## 2010-11-06 DIAGNOSIS — G8929 Other chronic pain: Secondary | ICD-10-CM | POA: Insufficient documentation

## 2010-11-06 DIAGNOSIS — Z1239 Encounter for other screening for malignant neoplasm of breast: Secondary | ICD-10-CM

## 2010-11-06 DIAGNOSIS — IMO0001 Reserved for inherently not codable concepts without codable children: Secondary | ICD-10-CM

## 2010-11-06 DIAGNOSIS — M179 Osteoarthritis of knee, unspecified: Secondary | ICD-10-CM

## 2010-11-06 DIAGNOSIS — M25569 Pain in unspecified knee: Secondary | ICD-10-CM | POA: Insufficient documentation

## 2010-11-06 MED ORDER — HYDROCORTISONE 2.5 % EX CREA
TOPICAL_CREAM | Freq: Two times a day (BID) | CUTANEOUS | Status: AC
Start: 2010-11-06 — End: 2011-01-05

## 2010-11-06 MED ORDER — NAPROXEN 500 MG PO TABS
500.0000 mg | ORAL_TABLET | Freq: Two times a day (BID) | ORAL | Status: DC
Start: 2010-11-06 — End: 2010-12-20

## 2010-11-06 NOTE — Progress Notes (Signed)
55 year old female seen in follow up:    Knee pain  Getting cortisone shots from Dr. Maebelle Munroe every 3 months for the last 9 months.  Problems started 6 years ago.  Improved after losing weight initially  Most Recent Weight Reading(s)  11/06/10 : 198 lb (89.812 kg)  09/26/10 : 200 lb 9.6 oz (90.992 kg)  09/17/10 : 201 lb (91.173 kg)  01/04/10 : 203 lb (92.08 kg)  11/10/09 : 204 lb (92.534 kg)  Never had surgery on the knees  Last x-rays 9 months ago, Dr. Maebelle Munroe  No prior MRIs.  No prior PT for knees.  Unable to squat.  Able to stand for less than 1 hour before she needs to change position.  Not currently taking antiinflammatories.    Foot and ankle pain  Ankle pain for 10 years.  On recent imaging there was evidence of an old fracture  History of fracture of right ankle.  Bunion surgery on right foot, Dr. Terressa Koyanagi, [facility], 3-4 years ago    Chronic low back pain  Present for about 30 years  Has PT, most recently about 6 years ago  Does a home exercise program    Anxiety disorder  Clonazepam has been tapered to 0.5 mg daily  Feeling anxious frequently  Sometimes has to leave the store because of panic symptoms.  Feeling depressed, doesn't want to go out because missing teeth.    Opiate dependence.  Current methadone dose 52 mg daily.    Has not been able to taper further as she is tapering clonazepam.    BP 130/86   Pulse 55   Temp(Src) 97.3 F (36.3 C) (Oral)   Wt 198 lb (89.812 kg)   SpO2 99%  The patient is alert verbal and engaged.  Speech is clear and not tangential. Mood and affect are anxious.  There is no suicidal ideation or overt psychosis.  BACK:  Lumbosacral spine area reveals bilateral paraspinous tenderness, no mass.  Slightly limited, painful lumbosacral range of motion is noted. Straight leg raise elicits only low back pain at 60 degrees on both sides. DTR's, motor strength and sensation normal.  Slightly restricted range of motion in knees    Assessment and plan:  Anxiety disorder  Sees  Dr. Ferd Hibbs again 7/20 and continues to taper  Finding this difficult but we reviewed this is also consistent with her long-term plan to get off benzodiazepines so that she can start suboxone    Knee pain  Will continue to follow up with Dr. Maebelle Munroe  At some point may need TKR but she is rather young at this point  - start standing nsaids (naproxen)    Obesity  Exercising, making some progress, albeit slow  Most Recent Weight Reading(s)  11/06/10 : 198 lb (89.812 kg)  09/26/10 : 200 lb 9.6 oz (90.992 kg)  09/17/10 : 201 lb (91.173 kg)  01/04/10 : 203 lb (92.08 kg)  11/10/09 : 204 lb (92.534 kg)    Smoking.  Action phase  - varenicline  - The patient was educated regarding the risks, benefits, and potential side effects of new medication.

## 2010-11-06 NOTE — Telephone Encounter (Signed)
Left message on voicemail Mammogram scheduled 7/24@10 :40am [facility] [address] [place]

## 2010-11-09 DIAGNOSIS — M179 Osteoarthritis of knee, unspecified: Secondary | ICD-10-CM | POA: Insufficient documentation

## 2010-11-09 DIAGNOSIS — M1710 Unilateral primary osteoarthritis, unspecified knee: Principal | ICD-10-CM

## 2010-11-09 MED ORDER — VARENICLINE TARTRATE 0.5 MG X 11 & 1 MG X 42 PO MISC
ORAL | Status: AC
Start: 2010-11-09 — End: 2010-12-10

## 2010-11-09 MED ORDER — VARENICLINE TARTRATE 1 MG PO TABS
1.0000 mg | ORAL_TABLET | Freq: Two times a day (BID) | ORAL | Status: DC
Start: 2010-12-07 — End: 2010-12-20

## 2010-11-11 ENCOUNTER — Other Ambulatory Visit (HOSPITAL_BASED_OUTPATIENT_CLINIC_OR_DEPARTMENT_OTHER): Payer: Self-pay | Admitting: Internal Medicine

## 2010-11-11 ENCOUNTER — Other Ambulatory Visit: Payer: Self-pay | Admitting: Obstetrics and Gynecology

## 2010-11-11 DIAGNOSIS — F411 Generalized anxiety disorder: Secondary | ICD-10-CM

## 2010-11-11 DIAGNOSIS — Z1231 Encounter for screening mammogram for malignant neoplasm of breast: Secondary | ICD-10-CM

## 2010-11-11 NOTE — Telephone Encounter (Signed)
Message copied by Evangeline Gula on Mon Nov 11, 2010 12:05 PM  ------       Message from: Kathrin Penner       Created: Mon Nov 11, 2010 11:56 AM       Regarding: refill         [facility]              Person calling on behalf of patient: Patient (self)              May list multiple medications in this section       Medicine Name: buPROPion (WELLBUTRIN XL) 300 MG  ,clonazepam (KLONOPIN) 0.5 MG                       Documented patient preferred pharmacies:       [facility]              Phone: [phone] Fax: [phone]              CALL BACK NUMBER:               Patient's language of care: English              Patient does not need an interpreter.

## 2010-11-11 NOTE — Telephone Encounter (Signed)
Refill is due  Forwarded to Dr. Ferd Hibbs.

## 2010-11-11 NOTE — Telephone Encounter (Signed)
Kristiann Noyce is a 55 year old female has requested a refill of Wellbutrin and Clonazepam.  Other Med Adult:  Most Recent BP Reading(s)  11/06/10 : 130/86        Cholesterol (mg/dl)   Date     Date  Value    01/04/2010  264*   ----------    LDL (mg/dl)   Date     Date  Value    01/04/2010  101*   ----------    HDL (mg/dl)   Date     Date  Value    01/04/2010  38    ----------    No results found for this basename: tg:1        No results found for this basename: TSHSC:1        No results found for this basename: TSH:1      HEMOGLOBIN A1C (%)   Date     Date  Value    01/04/2010  5.8    ----------        INR (no units)   Date     Date  Value    02/16/2007  1.0*    07/03/2006  < 1.0*   ----------       Documented patient preferred pharmacies:  [name] [address]: [phone] Fax: [phone]

## 2010-11-12 ENCOUNTER — Other Ambulatory Visit (HOSPITAL_BASED_OUTPATIENT_CLINIC_OR_DEPARTMENT_OTHER): Payer: Self-pay

## 2010-11-12 ENCOUNTER — Telehealth (HOSPITAL_BASED_OUTPATIENT_CLINIC_OR_DEPARTMENT_OTHER): Payer: Self-pay | Admitting: Psychiatry

## 2010-11-12 DIAGNOSIS — F1121 Opioid dependence, in remission: Secondary | ICD-10-CM

## 2010-11-12 DIAGNOSIS — F411 Generalized anxiety disorder: Secondary | ICD-10-CM

## 2010-11-12 MED ORDER — BUPROPION HCL ER (XL) 300 MG PO TB24
300.0000 mg | ORAL_TABLET | Freq: Every morning | ORAL | Status: DC
Start: 2010-11-12 — End: 2010-12-20

## 2010-11-12 MED ORDER — PROPRANOLOL HCL 40 MG PO TABS
40.0000 mg | ORAL_TABLET | Freq: Two times a day (BID) | ORAL | Status: DC | PRN
Start: 2010-11-12 — End: 2010-12-20

## 2010-11-12 MED ORDER — CLONAZEPAM 0.5 MG PO TABS
0.5000 mg | ORAL_TABLET | Freq: Every day | ORAL | Status: DC | PRN
Start: 2010-11-12 — End: 2012-07-13

## 2010-11-12 MED ORDER — CLONAZEPAM 0.5 MG PO TABS
0.5000 mg | ORAL_TABLET | Freq: Every day | ORAL | Status: DC | PRN
Start: 2010-11-12 — End: 2010-12-13

## 2010-11-12 NOTE — Telephone Encounter (Signed)
Calls for med refills.  Klonopin is being weaned, down to 0.5 mg daily this month, then stop.  Can continue others

## 2010-11-12 NOTE — Telephone Encounter (Signed)
Message copied by Harrington Challenger on Tue Nov 12, 2010  2:54 PM  ------       Message from: Tamera Punt       Created: Tue Nov 12, 2010  2:13 PM       Regarding: Crying patient - mad about medication dosage       Contact: [phone]         [facility]              Saleha Kalp [id number], 55 year old, female, Telephone Information:       Home Phone      [phone]       Work Phone      [phone]       Mobile          Not on file.       Home Phone      Not on file.                     CALL BACK NUMBER: [phone]       Best time to call back:        Cell phone:        Other phone:              Available times:              Patient's language of care: English              Patient does not need an interpreter.              Patient's PCP: Hildred Laser, MD              Person calling on behalf of patient: Patient (self)              Calls today Patient crying hysterical.  Angry with Dr. Ferd Hibbs because he reduced the dosage of her medication.  Says she does not want to talk to him and is going to tell him how she feels when she sees him on Friday               Patient's Preferred Pharmacy:        [name]              Phone: [phone] Fax: [phone]

## 2010-11-12 NOTE — Telephone Encounter (Signed)
Spoke with pt who is very frustrated with the weaning of her klonopin. She is asking if script can be faxed, let her know that this has been done.

## 2010-11-12 NOTE — Telephone Encounter (Signed)
Message copied by Sherrine Maples on Tue Nov 12, 2010  3:03 PM  ------       Message from: Tamera Punt       Created: Tue Nov 12, 2010  2:13 PM       Regarding: Crying patient - mad about medication dosage       Contact: [phone]         [facility]              Jennine Peddy [id number], 55 year old, female, Telephone Information:       Home Phone      [phone]       Work Phone      [phone]       Mobile          Not on file.       Home Phone      Not on file.                     CALL BACK NUMBER: [phone]       Best time to call back:        Cell phone:        Other phone:              Available times:              Patient's language of care: English              Patient does not need an interpreter.              Patient's PCP: Hildred Laser, MD              Person calling on behalf of patient: Patient (self)              Calls today Patient crying hysterical.  Angry with Dr. Ferd Hibbs because he reduced the dosage of her medication.  Says she does not want to talk to him and is going to tell him how she feels when she sees him on Friday               Patient's Preferred Pharmacy:        [name] [address]              Phone: [phone] Fax: [phone]

## 2010-11-12 NOTE — Telephone Encounter (Signed)
Not printing

## 2010-11-15 ENCOUNTER — Ambulatory Visit (HOSPITAL_BASED_OUTPATIENT_CLINIC_OR_DEPARTMENT_OTHER): Payer: MEDICARE | Admitting: Psychiatry

## 2010-11-19 ENCOUNTER — Ambulatory Visit: Payer: Self-pay | Admitting: Emergency Medicine

## 2010-11-20 ENCOUNTER — Encounter (HOSPITAL_COMMUNITY): Payer: 59

## 2010-11-21 ENCOUNTER — Telehealth (HOSPITAL_BASED_OUTPATIENT_CLINIC_OR_DEPARTMENT_OTHER): Payer: Self-pay | Admitting: Internal Medicine

## 2010-11-22 ENCOUNTER — Ambulatory Visit (HOSPITAL_BASED_OUTPATIENT_CLINIC_OR_DEPARTMENT_OTHER): Payer: MEDICARE | Admitting: Internal Medicine

## 2010-11-26 ENCOUNTER — Telehealth (HOSPITAL_BASED_OUTPATIENT_CLINIC_OR_DEPARTMENT_OTHER): Payer: Self-pay

## 2010-11-26 NOTE — Telephone Encounter (Signed)
Called patient, she has been weaning methadone dose, she is down to 53 mg and is not feeling well. She has been vomiting, feels shaky, she missed appt with PCP on Friday, and has been given new appt on 8/9. Patient concerned, states her BP has been elevated, would like to see provider soon, patient does not want to increase her methadone dose. Patient asks provider call her, advised I would forward message.

## 2010-11-26 NOTE — Telephone Encounter (Signed)
I left VM to call back.  Will probably need appt for this  OK to book 1:30 or 3:45 Wed otherwise next week.

## 2010-11-27 ENCOUNTER — Encounter (HOSPITAL_COMMUNITY): Payer: 59 | Attending: Rheumatology

## 2010-11-27 DIAGNOSIS — M069 Rheumatoid arthritis, unspecified: Secondary | ICD-10-CM | POA: Insufficient documentation

## 2010-12-05 ENCOUNTER — Ambulatory Visit (HOSPITAL_BASED_OUTPATIENT_CLINIC_OR_DEPARTMENT_OTHER): Payer: MEDICARE | Admitting: Internal Medicine

## 2010-12-16 ENCOUNTER — Telehealth (HOSPITAL_BASED_OUTPATIENT_CLINIC_OR_DEPARTMENT_OTHER): Payer: Self-pay

## 2010-12-16 NOTE — Telephone Encounter (Signed)
Called patient, she reports she has been having a lot of reflux, she reports everything she eats seems to come back up into her mouth, she was taking zantac, this stopped working. Patient has appt with provider next week, would like to be seen sooner, appt scheduled Friday with pcp.

## 2010-12-20 ENCOUNTER — Encounter (HOSPITAL_BASED_OUTPATIENT_CLINIC_OR_DEPARTMENT_OTHER): Payer: Self-pay | Admitting: Internal Medicine

## 2010-12-20 ENCOUNTER — Ambulatory Visit (HOSPITAL_BASED_OUTPATIENT_CLINIC_OR_DEPARTMENT_OTHER): Payer: MEDICARE | Admitting: Internal Medicine

## 2010-12-20 VITALS — BP 120/90 | HR 88 | Temp 97.0°F | Wt 194.0 lb

## 2010-12-20 DIAGNOSIS — K469 Unspecified abdominal hernia without obstruction or gangrene: Secondary | ICD-10-CM

## 2010-12-20 DIAGNOSIS — K219 Gastro-esophageal reflux disease without esophagitis: Secondary | ICD-10-CM

## 2010-12-20 DIAGNOSIS — M25569 Pain in unspecified knee: Secondary | ICD-10-CM

## 2010-12-20 DIAGNOSIS — Z1239 Encounter for other screening for malignant neoplasm of breast: Secondary | ICD-10-CM

## 2010-12-20 DIAGNOSIS — R1310 Dysphagia, unspecified: Secondary | ICD-10-CM

## 2010-12-20 DIAGNOSIS — K5900 Constipation, unspecified: Secondary | ICD-10-CM

## 2010-12-20 DIAGNOSIS — IMO0001 Reserved for inherently not codable concepts without codable children: Secondary | ICD-10-CM

## 2010-12-20 DIAGNOSIS — F411 Generalized anxiety disorder: Secondary | ICD-10-CM

## 2010-12-20 MED ORDER — OMEPRAZOLE 20 MG PO TBEC
1.00 | DELAYED_RELEASE_TABLET | Freq: Every day | ORAL | Status: AC
Start: 2010-12-20 — End: 2011-03-19

## 2010-12-20 MED ORDER — GLUCOSAMINE-CHONDROITIN 500-400 MG PO TABS
1.00 | ORAL_TABLET | Freq: Three times a day (TID) | ORAL | Status: AC
Start: 2010-12-20 — End: 2011-06-18

## 2010-12-20 MED ORDER — BUPROPION HCL ER (XL) 300 MG PO TB24
300.0000 mg | ORAL_TABLET | Freq: Every morning | ORAL | Status: DC
Start: 2010-12-20 — End: 2011-12-11

## 2010-12-20 MED ORDER — NAPROXEN 500 MG PO TABS
500.0000 mg | ORAL_TABLET | Freq: Two times a day (BID) | ORAL | Status: DC
Start: 2010-12-20 — End: 2012-07-26

## 2010-12-20 MED ORDER — POLYETHYLENE GLYCOL 3350 17 GM/SCOOP PO POWD
17.00 g | Freq: Every day | ORAL | Status: AC
Start: 2010-12-20 — End: 2011-06-18

## 2010-12-20 MED ORDER — LISINOPRIL 5 MG PO TABS
5.0000 mg | ORAL_TABLET | Freq: Every day | ORAL | Status: DC
Start: 2010-12-20 — End: 2012-01-12

## 2010-12-20 NOTE — Patient Instructions (Signed)
For your convenience the following is a list of community behavioral health providers:  If you find that you are having difficulty with a referral or finding the right practice for you, please let us know, we would be happy to help  Please note that for all practices, people must be in regular therapy in order to be able to be followed by a psychiatrist    [facility]  [phone] (central intake office)  [facility] has 3 clinics but you need to go through the central intake line first:  -[facility], [address], [place], [phone]  -[facility], [address], [place], [phone]  -[facility], [address], [place], [phone]    [facility]  [address] [place] [address], [place], [place] [address]  [phone] is the intake line for the [place] Office    [address], [place], [place]  [phone] is the intake line for the [place] office    [facility]  [address], [place]  [phone]    [facility]  For children and adolescents and their families  Will work with families in [place] and some parts of [place], have homebased services as well  [phone]     [facility]   Adult Outpatient   [address], [place], [place]  [phone]   If interested, please have your PCP make the referral    [facility]  [phone]  [place] team has outreach (homebased) services for people residing in: [place], [place], [place], [place], [place], [place] and [place]

## 2010-12-20 NOTE — Progress Notes (Signed)
55 year old female seen in follow up:    Vomiting  Started about 3 weeks ago  Feels like food is getting stuck  Dysphagia for both liquids and solids  Zantac worked for a while but have stopped working now, so she has stopped taking them.  Pain in right back  Has hernia that was repaired in right groin (appendectomy at that time) a number of years ago, has pain at that site too.  That site has been achy for a time, but has become more painful since she started vomiting.    Opioid depedence  Continues on the same dose of methadone  Has put taper on hold at 52 mg since she stopped   She has been going to the gym and is working hard to lose weight  Most Recent Weight [place](s)  12/20/10 : 194 lb (87.998 kg)  11/06/10 : 198 lb (89.812 kg)  09/26/10 : 200 lb 9.6 oz (90.992 kg)  09/17/10 : 201 lb (91.173 kg)  01/04/10 : 203 lb (92.08 kg)    Knee pain  Was been seeing Dr. Maebelle Munroe  Had a number of injections  Never had MRI  Feels like things are not getting any better and it is now interfering with her ability to exercise and lose weight    BP 120/90   Pulse 88   Temp 97 F (36.1 C)   Wt 194 lb (87.998 kg)   SpO2 99%  Elevated blood pressure confirmed, equal in both arms.  Regular rate and rhythm  Lungs clear  There is a RLQ mass on valsalva   The abdomen is soft without tenderness, guarding, rebound or organomegaly. Bowel sounds are normal. No CVA tenderness or inguinal adenopathy noted.    Assessment and plan:  GERD  Multifactorial  DC of chronic psychiatric meds may be playing a role  However that should not cause dysphagia for liquids and solids  - check barium swallow    Recurrent abdominal hernia  This is probably not the cause of her current symptoms  Nevertheless she would like to see surgery to discuss risks and benefits of repair    719.46D Knee pain   Comment: likely some early OA  Plan: REFERRAL TO ORTHOPEDICS ( INT), ORDER FOR         GENERAL X-RAY, glucosamine-chondroitin         (GLUCOSAMINE CHONDRO  COMPLEX) 500-400 MG         tablet, naproxen (NAPROSYN) 500 MG tablet        Check plain films, referred to ortho for second opinion, use naproxen sparingly because of current GI symptoms     564.00A Constipation  Comment: not responding to colace, may contribute to gastritis  Plan: Miralax    796.46M Elevated BP  Comment: borderline  Plan: lisinopril (PRINIVIL,ZESTRIL) 5 MG tablet        Recheck at follow up     300.00 ANXIETY STATE NOS  Comment: trying to find new psychiatrist, resources given  Plan: buPROPion (WELLBUTRIN XL) 300 MG 24 hr tablet        I will refill meds other than clonazepam until then    V76.48F Screening for malignant neoplasm of breast  Comment:    Plan: ORDER FOR MAMMOGRAM (SCREENING)             530.81K GERD (gastroesophageal reflux disease)  Comment:    Plan: Omeprazole 20 MG TBEC

## 2010-12-23 ENCOUNTER — Ambulatory Visit
Admission: RE | Admit: 2010-12-23 | Discharge: 2010-12-23 | Disposition: A | Discharge status: Home / Self Care | Payer: 59 | Source: Ambulatory Visit | Attending: Obstetrics and Gynecology | Admitting: Obstetrics and Gynecology

## 2010-12-23 DIAGNOSIS — Z1231 Encounter for screening mammogram for malignant neoplasm of breast: Secondary | ICD-10-CM

## 2010-12-26 ENCOUNTER — Ambulatory Visit: Payer: Self-pay | Admitting: Internal Medicine

## 2010-12-26 ENCOUNTER — Telehealth (HOSPITAL_BASED_OUTPATIENT_CLINIC_OR_DEPARTMENT_OTHER): Payer: Self-pay

## 2010-12-26 ENCOUNTER — Ambulatory Visit (HOSPITAL_BASED_OUTPATIENT_CLINIC_OR_DEPARTMENT_OTHER): Payer: Self-pay | Admitting: Internal Medicine

## 2010-12-26 NOTE — Telephone Encounter (Signed)
Tried to call patient back, no answer, left message on voicemail to call clinic back, ? What medication patient is looking for.

## 2010-12-26 NOTE — Telephone Encounter (Signed)
Message copied by Emelia Loron on Thu Dec 26, 2010 12:57 PM  ------       Message from: Sena Hitch, ADA A.       Created: Thu Dec 26, 2010 10:01 AM       Regarding: Calls today  Dr Diona Foley forgot to send her medication to the pharmacy.         [facility]              Paw Karstens [id number], 55 year old, female, Telephone Information:       Home Phone      [phone]       Work Phone      [phone]       Mobile          Not on file.       Home Phone      Not on file.                     [name] NUMBER: [phone]       Best time to call back:        Cell phone:        Other phone:              Available times:              Patient's language of care: English              Patient does not need an interpreter.              Patient's PCP: Hildred Laser, MD              Person calling on behalf of patient: Patient (self)              Calls today  Dr Diona Foley forgot to send her medication to the pharmacy.              Patient's Preferred Pharmacy:        [facility]              Phone: [phone] Fax: [phone]

## 2011-01-01 ENCOUNTER — Ambulatory Visit (HOSPITAL_BASED_OUTPATIENT_CLINIC_OR_DEPARTMENT_OTHER): Payer: MEDICARE | Admitting: Surgery

## 2011-01-02 ENCOUNTER — Other Ambulatory Visit (HOSPITAL_BASED_OUTPATIENT_CLINIC_OR_DEPARTMENT_OTHER): Payer: Self-pay | Admitting: Internal Medicine

## 2011-01-02 NOTE — Telephone Encounter (Signed)
Called home# lm to call office -patient needs mammo

## 2011-01-03 ENCOUNTER — Ambulatory Visit: Payer: Self-pay | Admitting: Internal Medicine

## 2011-01-06 ENCOUNTER — Ambulatory Visit (HOSPITAL_BASED_OUTPATIENT_CLINIC_OR_DEPARTMENT_OTHER): Payer: MEDICARE | Admitting: Surgery

## 2011-01-08 ENCOUNTER — Telehealth (HOSPITAL_BASED_OUTPATIENT_CLINIC_OR_DEPARTMENT_OTHER): Payer: Self-pay | Admitting: Internal Medicine

## 2011-01-09 ENCOUNTER — Ambulatory Visit (HOSPITAL_BASED_OUTPATIENT_CLINIC_OR_DEPARTMENT_OTHER): Payer: MEDICARE | Admitting: Internal Medicine

## 2011-01-10 ENCOUNTER — Telehealth (HOSPITAL_BASED_OUTPATIENT_CLINIC_OR_DEPARTMENT_OTHER): Payer: Self-pay

## 2011-01-10 NOTE — Telephone Encounter (Signed)
Received request for utility letter, letter drafted, forwarded to provider/[place] to sign/fax.

## 2011-01-10 NOTE — Telephone Encounter (Signed)
Message copied by Emelia Loron on Fri Jan 10, 2011  3:00 PM  ------       Message from: Hilliard Clark A.       Created: Fri Jan 10, 2011  2:31 PM       Regarding: Utility Letter       Contact: [phone]         [facility]              Shanavia Makela [id number], 55 year old, female, Telephone Information:       Home Phone      [phone]       Work Phone      [phone]       Mobile          Not on file.       Home Phone      Not on file.              [name] NUMBER: [phone]              Patient's language of care: English              Patient does not need an interpreter.              Patient's PCP: Hildred Laser, MD              Person calling on behalf of patient: Patient (self)              Calls today requesting a letter for: Utilities        Name of Utility: [employer]  Glass blower/designer)       Name on Utility Account: Samaira Holzworth        Utility Account Number: [id number]       Fax Number to send Letter: [phone]              Patient's Preferred Pharmacy:        [name]       Phone: [phone] Fax: [phone]

## 2011-01-10 NOTE — Telephone Encounter (Signed)
Letter signed and placed in out-box to be faxed.

## 2011-01-13 ENCOUNTER — Telehealth (HOSPITAL_BASED_OUTPATIENT_CLINIC_OR_DEPARTMENT_OTHER): Payer: Self-pay

## 2011-01-13 NOTE — Telephone Encounter (Signed)
Spoke with pt who is wondering if naproxen dose could be increased. Let pt know that this medication cannot be taken more than 2 times daily. Pt goes on to say that glucosamine pills were not included with rest of meds. Spoke with pharmacy who states that this medication is not covered by insurance as available OTC. Let pt know this, she states that she cannot afford. Pt goes on to inquire about a cream that PCP had mentioned at last OV, ortho-bi-flex?, would like this sent in to pharmacy. Let pt know I will forward her request to PCP.

## 2011-01-13 NOTE — Telephone Encounter (Signed)
Message copied by Sherrine Maples on Mon Jan 13, 2011 11:49 AM  ------       Message from: [name]       Created: Mon Jan 13, 2011 11:16 AM       Regarding: SICK:  Knee pain and would like strong for knee pain          [facility]              Liesl Simons [id number], 55 year old, female, Telephone Information:       Home Phone      [phone]       Work Phone      [phone]       Mobile          Not on file.       Home Phone      Not on file.                     [name] NUMBER: [phone] Daughter phone until 2:00       Best time to call back:        Cell phone:        Other phone:              Available times:              Patient's language of care: English              Patient does not need an interpreter.              Patient's PCP: Hildred Laser, MD              Person calling on behalf of patient: Patient (self)       SICK:  Knee pain and would like strong for knee pain        Calls today with a sick call.              Patient's Preferred Pharmacy:        [facility]              Phone: [phone] Fax: [phone]

## 2011-01-14 NOTE — Telephone Encounter (Signed)
I have attempted to contact this patient by phone with the following results: no answer/recording states that number is not accepting calls at this time.

## 2011-01-14 NOTE — Telephone Encounter (Signed)
Osteo bi-flex is a type of glucosamine, which is not covered.  I would be glad to discuss her pain management regimen at an appointment  Unfortunately she missed her last one with me.

## 2011-01-20 ENCOUNTER — Telehealth (HOSPITAL_BASED_OUTPATIENT_CLINIC_OR_DEPARTMENT_OTHER): Payer: Self-pay | Admitting: Surgery

## 2011-01-20 ENCOUNTER — Ambulatory Visit (HOSPITAL_BASED_OUTPATIENT_CLINIC_OR_DEPARTMENT_OTHER): Payer: MEDICARE | Admitting: Surgery

## 2011-01-20 ENCOUNTER — Ambulatory Visit (HOSPITAL_BASED_OUTPATIENT_CLINIC_OR_DEPARTMENT_OTHER): Payer: MEDICARE | Admitting: Internal Medicine

## 2011-01-20 VITALS — BP 108/73 | HR 80

## 2011-01-20 DIAGNOSIS — K439 Ventral hernia without obstruction or gangrene: Secondary | ICD-10-CM

## 2011-01-20 LAB — BASIC METABOLIC PANEL
ANION GAP: 6 mmol/L (ref 3–11)
BUN (UREA NITROGEN): 19 mg/dl (ref 6–20)
CALCIUM: 9.3 mg/dl (ref 8.6–10.3)
CARBON DIOXIDE: 29 mmol/L (ref 22–32)
CHLORIDE: 108 mmol/L (ref 101–111)
CREATININE: 1.1 mg/dl (ref 0.4–1.2)
ESTIMATED GLOMERULAR FILT RATE: 52 mL/min — ABNORMAL LOW (ref >60)
Glucose Random: 116 mg/dl (ref 74–160)
POTASSIUM: 4.2 mmol/L (ref 3.5–5.1)
SODIUM: 143 mmol/L (ref 135–144)
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
## 2011-01-20 NOTE — Telephone Encounter (Signed)
At front desk a CT scan was scheduled for pt for Fri Sept 27 at 8:40 [place], Pt was instructed to stop at [facility] for bloodwork and to pick up prep kit and she will return on Friday for scan.  A follow up w/ Dr Ronne Binning is scheduled for October 15 at 9:30. Dt 9/24

## 2011-01-21 ENCOUNTER — Telehealth (HOSPITAL_BASED_OUTPATIENT_CLINIC_OR_DEPARTMENT_OTHER): Payer: Self-pay | Admitting: Ambulatory Care

## 2011-01-21 NOTE — Telephone Encounter (Signed)
I left a message on the patient's V/M to return call to the clinic

## 2011-01-21 NOTE — Telephone Encounter (Signed)
Message copied by Arta Bruce on Tue Jan 21, 2011 11:24 AM  ------       Message from: [name]       Created: Tue Jan 21, 2011 10:39 AM       Regarding: SICK:   medicines giving her Acid [facility]              Cheryl Dixon [id number], 55 year old, female, Telephone Information:       Home Phone      [phone]       Work Phone      [phone]       Mobile          [phone]       Home Phone      Not on file.                     CALL BACK NUMBER: [phone] there until 1       Best time to call back:        Cell phone:        Other phone:              Available times:              Patient's language of care: English              Patient does not need an interpreter.              Patient's PCP: Hildred Laser, MD              Person calling on behalf of patient: Patient (self)       SICK:   medicines giving her Acid reflack       Calls today with a sick call.              Patient's Preferred Pharmacy:        [facility]              Phone: [phone] Fax: [phone]

## 2011-01-22 ENCOUNTER — Ambulatory Visit: Payer: Self-pay | Admitting: Surgery

## 2011-01-22 ENCOUNTER — Encounter (HOSPITAL_COMMUNITY): Payer: 59

## 2011-01-22 NOTE — Telephone Encounter (Signed)
Message copied by Arta Bruce on Wed Jan 22, 2011 12:55 PM  ------       Message from: Sharyne Richters       Created: Tue Jan 21, 2011  4:09 PM       Regarding: Returned rn call        Contact: [phone]         Returned rn call

## 2011-01-22 NOTE — Telephone Encounter (Signed)
I spoke with the patient. Patient has not called the Clinic. Patient is fine

## 2011-01-27 ENCOUNTER — Ambulatory Visit (HOSPITAL_BASED_OUTPATIENT_CLINIC_OR_DEPARTMENT_OTHER): Payer: MEDICARE | Admitting: Surgery

## 2011-01-29 ENCOUNTER — Encounter (HOSPITAL_COMMUNITY): Payer: 59

## 2011-02-10 ENCOUNTER — Telehealth (HOSPITAL_BASED_OUTPATIENT_CLINIC_OR_DEPARTMENT_OTHER): Payer: Self-pay | Admitting: Surgery

## 2011-02-10 ENCOUNTER — Ambulatory Visit (HOSPITAL_BASED_OUTPATIENT_CLINIC_OR_DEPARTMENT_OTHER): Payer: MEDICARE | Admitting: Surgery

## 2011-02-10 NOTE — Telephone Encounter (Signed)
Continuously rings unable to leave a message.

## 2011-02-19 ENCOUNTER — Telehealth (HOSPITAL_BASED_OUTPATIENT_CLINIC_OR_DEPARTMENT_OTHER): Payer: Self-pay

## 2011-02-19 NOTE — Telephone Encounter (Signed)
Message copied by Emelia Loron on Wed Feb 19, 2011  2:00 PM  ------       Message from: [name]       Created: Wed Feb 19, 2011  9:52 AM       Regarding: SICK:   month - stomach is not able hold any food-    bowels    acid reflex         [facility]              Memori Sammon [id number], 55 year old, female, Telephone Information:       Home Phone      [phone]       Work Phone      [phone]       Mobile          [phone]       Home Phone      Not on file.                     CALL BACK NUMBER: [phone] only       Best time to call back:        Cell phone:        Other phone:              Available times:              Patient's language of care: English              Patient does not need an interpreter.              Patient's PCP: Hildred Laser, MD              Person calling on behalf of patient:       SICK:   month - stomach is not able hold any food-    bowels    acid reflex       Calls today with a sick call.              Patient's Preferred Pharmacy:        [facility]              Phone: [phone] Fax: [phone]

## 2011-02-19 NOTE — Telephone Encounter (Signed)
Called patient, she reports she saw provider a month ago for epigastric distress, she was given omeprazole which has not helped, she has also been taking tums. Patient reports she has only been able to eat rice and mashed potatoes, anything else does not agree with her and causes pain. She also has had vomiting at times-especially in morning. She increased omeprazole to BID to see if it would help, it did not. Provider sent patient to surgery to see about hernia, patient did not go for recommended CT scan, she reports she does not believe this issue has to do with hernia, she believes it is reflux. Patient would like to see provider, appt scheduled Friday.

## 2011-02-21 ENCOUNTER — Ambulatory Visit (HOSPITAL_BASED_OUTPATIENT_CLINIC_OR_DEPARTMENT_OTHER): Payer: MEDICARE | Admitting: Internal Medicine

## 2011-03-03 ENCOUNTER — Ambulatory Visit (HOSPITAL_BASED_OUTPATIENT_CLINIC_OR_DEPARTMENT_OTHER): Payer: MEDICARE | Admitting: Internal Medicine

## 2011-03-03 NOTE — Progress Notes (Signed)
This encounter was opened in error.  Please disregard.

## 2011-03-09 ENCOUNTER — Telehealth (HOSPITAL_BASED_OUTPATIENT_CLINIC_OR_DEPARTMENT_OTHER): Payer: Self-pay | Admitting: Internal Medicine

## 2011-03-09 DIAGNOSIS — R109 Unspecified abdominal pain: Secondary | ICD-10-CM

## 2011-03-24 ENCOUNTER — Telehealth (HOSPITAL_BASED_OUTPATIENT_CLINIC_OR_DEPARTMENT_OTHER): Payer: Self-pay | Admitting: Internal Medicine

## 2011-03-24 ENCOUNTER — Ambulatory Visit (HOSPITAL_BASED_OUTPATIENT_CLINIC_OR_DEPARTMENT_OTHER): Payer: MEDICARE | Admitting: Internal Medicine

## 2011-03-27 ENCOUNTER — Telehealth (HOSPITAL_BASED_OUTPATIENT_CLINIC_OR_DEPARTMENT_OTHER): Payer: Self-pay | Admitting: Internal Medicine

## 2011-03-27 NOTE — Telephone Encounter (Signed)
Unable to reach patient.  Letter mailed due for mammogram.

## 2011-04-08 DIAGNOSIS — M1990 Unspecified osteoarthritis, unspecified site: Secondary | ICD-10-CM | POA: Insufficient documentation

## 2011-04-08 DIAGNOSIS — M722 Plantar fascial fibromatosis: Secondary | ICD-10-CM | POA: Insufficient documentation

## 2011-04-08 DIAGNOSIS — B379 Candidiasis, unspecified: Secondary | ICD-10-CM | POA: Insufficient documentation

## 2011-05-26 ENCOUNTER — Telehealth (HOSPITAL_BASED_OUTPATIENT_CLINIC_OR_DEPARTMENT_OTHER): Payer: Self-pay | Admitting: Internal Medicine

## 2011-05-26 NOTE — Telephone Encounter (Signed)
Unable to reach patient all numbers are out of service.

## 2011-06-04 ENCOUNTER — Ambulatory Visit (HOSPITAL_BASED_OUTPATIENT_CLINIC_OR_DEPARTMENT_OTHER): Payer: MEDICARE | Admitting: Gastroenterology

## 2011-06-30 ENCOUNTER — Telehealth (HOSPITAL_BASED_OUTPATIENT_CLINIC_OR_DEPARTMENT_OTHER): Payer: Self-pay | Admitting: Internal Medicine

## 2011-06-30 NOTE — Telephone Encounter (Signed)
Unable to reach patient all numbers are out of service.

## 2011-07-26 ENCOUNTER — Ambulatory Visit (HOSPITAL_BASED_OUTPATIENT_CLINIC_OR_DEPARTMENT_OTHER): Payer: MEDICARE | Admitting: Internal Medicine

## 2011-08-25 ENCOUNTER — Other Ambulatory Visit: Payer: Self-pay | Admitting: Gastroenterology

## 2011-08-28 ENCOUNTER — Encounter (HOSPITAL_BASED_OUTPATIENT_CLINIC_OR_DEPARTMENT_OTHER): Payer: Self-pay | Admitting: Internal Medicine

## 2011-09-03 ENCOUNTER — Telehealth (HOSPITAL_BASED_OUTPATIENT_CLINIC_OR_DEPARTMENT_OTHER): Payer: Self-pay

## 2011-09-03 NOTE — Telephone Encounter (Signed)
Letter signed and left in out box to be faxed.

## 2011-09-03 NOTE — Telephone Encounter (Signed)
Message copied by Emelia Loron on Wed Sep 03, 2011  5:38 PM  ------       Message from: [facility], CARMEN       Created: Wed Sep 03, 2011  4:51 PM       Regarding: letter       Contact: [phone]         [facility]              Cheryl Dixon [id number], 56 year old, female, Telephone Information:       Home Phone      [phone]       Work Phone      Not on file.       Mobile          [phone]       Home Phone      Not on file.                     Patient's Preferred Pharmacy:        [facility]              Phone: [phone] Fax: [phone]                     CONFIRMED TODAY: No              CALL BACK NUMBER: [phone]              Patient's language of care: English              Patient does not need an interpreter.              Patient's PCP: Hildred Laser, MD              Person calling on behalf of patient: Patient (self)              Calls today requesting a letter for: Utilities        Name of Utility: [employer]   Glass blower/designer)       Name on Utility Account: Cheryl Dixon,Cheryl Dixon       Utility Account Number: [id number]       Fax Number to send Letter: patient-pls mail it to patient

## 2011-09-03 NOTE — Telephone Encounter (Signed)
Received request for utility letter, letter drafted, forwarded to provider/[place], patient requesting letter be mailed to her home not faxed.

## 2011-09-08 ENCOUNTER — Telehealth (HOSPITAL_BASED_OUTPATIENT_CLINIC_OR_DEPARTMENT_OTHER): Payer: Self-pay | Admitting: Registered Nurse

## 2011-09-08 NOTE — Progress Notes (Signed)
Needs appointment to review.  This is essentially a disability evaluation which I can't do by phone  Please let patient know.

## 2011-09-08 NOTE — Progress Notes (Signed)
I spoke with pt who stated she got 3 injections by Dr Tomma Lightning (ortho) and she needs a letter for clinic. I instructed pt to call Dr Tomma Lightning regarding shots.   Patient also requesting PT 1 form to be filled out for [employer]. Pt states she needs [employer] [facility] to get to appointments

## 2011-09-08 NOTE — Telephone Encounter (Signed)
Message copied by Katharina Caper on Mon Sep 08, 2011 10:43 AM  ------       Message from: [name]       Created: Mon Sep 08, 2011  9:43 AM       Regarding: Letter sating that she can't attended meeting at the Methadone clinic                       Cheryl Dixon [id number], 56 year old, female, Telephone Information:       Home Phone      [phone]       Work Phone      Not on file.       Mobile          [phone]       Home Phone      Not on file.                     Patient's Preferred Pharmacy:               [facility]       Phone: [phone] Fax: [phone]                                   [name] NUMBER:  [phone]       Best time to call back:        Cell phone:        Other phone:              Available times:              Patient's language of care: English              Patient does not need an interpreter.              Patient's PCP: Hildred Laser, MD              Person calling on behalf of patient:        Dr Tomma Lightning - gives her shot in her knees every 3 months               Letter sating that she can't attended meeting at the Methadone clinic       Her knees are in such pain  Needs to go at least once a week to Methadone cliinic              Calls today requesting a letter for:  Letter for her clinic                       ------

## 2011-09-09 NOTE — Progress Notes (Signed)
I spoke with patient regarding message from Dr Diona Foley. Pt scheduled an appointment to come in and see Dr Diona Foley on 09/17/11 at 3:30 PM. Pt also stated her knees are feeling better since the steroid injections.

## 2011-09-17 ENCOUNTER — Ambulatory Visit (HOSPITAL_BASED_OUTPATIENT_CLINIC_OR_DEPARTMENT_OTHER): Payer: MEDICARE | Admitting: Internal Medicine

## 2011-09-29 ENCOUNTER — Ambulatory Visit (HOSPITAL_BASED_OUTPATIENT_CLINIC_OR_DEPARTMENT_OTHER): Payer: MEDICARE | Admitting: Internal Medicine

## 2011-11-05 ENCOUNTER — Encounter (HOSPITAL_BASED_OUTPATIENT_CLINIC_OR_DEPARTMENT_OTHER): Payer: Self-pay | Admitting: Internal Medicine

## 2011-11-18 ENCOUNTER — Telehealth (HOSPITAL_BASED_OUTPATIENT_CLINIC_OR_DEPARTMENT_OTHER): Payer: Self-pay | Admitting: Registered Nurse

## 2011-11-18 NOTE — Telephone Encounter (Signed)
Message copied by Katharina Caper on Tue Nov 18, 2011  6:33 PM  ------       Message from: Hilliard Clark A.       Created: Tue Nov 18, 2011  3:05 PM       Regarding: Vomiting & Abdominal Pain       Contact: [phone]         Cheryl Dixon [id number], 56 year old, female, Telephone Information:       Home Phone      [phone]       Work Phone      Not on file.       Mobile          [phone]       Home Phone      Not on file.              [name] NUMBER: [phone]              Patient's language of care: English              Patient does not need an interpreter.              Patient's PCP: Hildred Laser, MD              Person calling on behalf of patient: Patient (self)              Calls today with a sick call. Vomiting & Abdominal Pain  ------

## 2011-11-18 NOTE — Progress Notes (Signed)
I returned call to pt and left message on voicemail.

## 2011-11-20 ENCOUNTER — Telehealth (HOSPITAL_BASED_OUTPATIENT_CLINIC_OR_DEPARTMENT_OTHER): Payer: Self-pay

## 2011-11-20 ENCOUNTER — Encounter (HOSPITAL_BASED_OUTPATIENT_CLINIC_OR_DEPARTMENT_OTHER): Payer: Self-pay

## 2011-11-20 ENCOUNTER — Emergency Department (HOSPITAL_BASED_OUTPATIENT_CLINIC_OR_DEPARTMENT_OTHER)
Admission: RE | Admit: 2011-11-20 | Disposition: A | Discharge status: Home / Self Care | Payer: Self-pay | Source: Emergency Department | Attending: Emergency Medicine | Admitting: Emergency Medicine

## 2011-11-20 LAB — US ABDOMEN COMPLETE

## 2011-11-20 LAB — CBC, PLATELET & DIFFERENTIAL
ABSOLUTE BASO COUNT: 0.1 10*3/uL (ref 0.0–0.1)
ABSOLUTE EOSINOPHIL COUNT: 0.3 10*3/uL (ref 0.0–0.8)
ABSOLUTE IMM GRAN COUNT: 0.04 10*3/uL — ABNORMAL HIGH (ref 0.00–0.03)
ABSOLUTE LYMPH COUNT: 1.6 10*3/uL (ref 0.6–5.9)
ABSOLUTE MONO COUNT: 0.4 10*3/uL (ref 0.2–1.4)
ABSOLUTE NEUTROPHIL COUNT: 3.3 10*3/uL (ref 1.6–8.3)
BASOPHIL %: 1.1 % (ref 0.0–1.2)
EOSINOPHIL %: 5.4 % (ref 0.0–7.0)
HEMATOCRIT: 45.4 % — ABNORMAL HIGH (ref 34.1–44.9)
HEMOGLOBIN: 14.4 g/dL (ref 11.2–15.7)
IMMATURE GRANULOCYTE %: 0.7 % — ABNORMAL HIGH (ref 0.0–0.4)
LYMPHOCYTE %: 27.9 % (ref 15.0–54.0)
MEAN CORP HGB CONC: 31.7 g/dL (ref 31.0–37.0)
MEAN CORPUSCULAR HGB: 27.5 pg (ref 26.0–34.0)
MEAN CORPUSCULAR VOL: 86.6 fL (ref 80.0–100.0)
MEAN PLATELET VOLUME: 9.6 fL (ref 8.7–12.5)
MONOCYTE %: 6.4 % (ref 4.0–13.0)
NEUTROPHIL %: 58.5 % (ref 40.0–75.0)
PLATELET COUNT: 261 10*3/uL (ref 150–400)
RBC DISTRIBUTION WIDTH STD DEV: 43.2 fL (ref 35.1–46.3)
RBC DISTRIBUTION WIDTH: 13.6 % (ref 11.5–14.3)
RED BLOOD CELL COUNT: 5.24 M/uL — ABNORMAL HIGH (ref 3.90–5.20)
WHITE BLOOD CELL COUNT: 5.6 10*3/uL (ref 4.0–11.0)

## 2011-11-20 LAB — POC URINALYSIS
BILIRUBIN, URINE: NEGATIVE
GLUCOSE,URINE: NEGATIVE
KETONE, URINE: NEGATIVE
NITRITE, URINE: NEGATIVE
PH URINE: 7.5 (ref 5.0–8.0)
PROTEIN, URINE: NEGATIVE
SPECIFIC GRAVITY, URINE: 1.015 (ref 1.003–1.030)
UROBILINOGEN URINE: 0.2 (ref 0.2–1.0)

## 2011-11-20 LAB — HOLD BLUE TOP TUBE

## 2011-11-20 LAB — US ABDOMEN PELVIS SCROTUM & OR RETROPERITONEUM DOPPLER LIMITED

## 2011-11-20 LAB — CHG HEPATIC FUNCTION PANEL
ALANINE AMINOTRANSFERASE: 20 IU/L (ref 7–35)
ALBUMIN: 4.5 g/dl (ref 3.4–4.8)
ALKALINE PHOSPHATASE: 56 IU/L (ref 25–106)
ASPARTATE AMINOTRANSFERASE: 21 IU/L (ref 8–34)
BILIRUBIN DIRECT: 0.1 mg/dl (ref 0.0–0.2)
BILIRUBIN TOTAL: 0.8 mg/dl (ref 0.2–1.1)
INDIRECT BILIRUBIN: 0.7 mg/dl (ref 0.2–0.9)
TOTAL PROTEIN: 7.3 g/dl (ref 5.9–7.5)

## 2011-11-20 LAB — BASIC METABOLIC PANEL
ANION GAP: 10 mmol/L (ref 3–11)
BUN (UREA NITROGEN): 8 mg/dl (ref 6–20)
CALCIUM: 9.5 mg/dl (ref 8.6–10.3)
CARBON DIOXIDE: 28 mmol/L (ref 22–32)
CHLORIDE: 104 mmol/L (ref 101–111)
CREATININE: 0.9 mg/dl (ref 0.4–1.2)
ESTIMATED GLOMERULAR FILT RATE: >60 mL/min (ref >60)
Glucose Random: 122 mg/dl (ref 74–160)
POTASSIUM: 3.8 mmol/L (ref 3.5–5.1)
SODIUM: 142 mmol/L (ref 135–144)

## 2011-11-20 LAB — HOLD RED TOP TUBE

## 2011-11-20 LAB — XR ABDOMEN (KUB) WITH UPRIGHT AND OR DECUBITUS

## 2011-11-20 LAB — LIPASE: LIPASE: 22 U/L (ref 10–50)

## 2011-11-20 LAB — TROPONIN I: TROPONIN I: 0.02 ng/mL (ref 0.00–0.04)

## 2011-11-20 LAB — AMYLASE: AMYLASE: 46 U/L (ref 28–100)

## 2011-11-20 MED ORDER — METHADONE HCL 10 MG PO TABS
55.00 mg | ORAL_TABLET | Freq: Once | ORAL | Status: AC
Start: 2011-11-20 — End: 2011-11-20
  Administered 2011-11-20: 55 mg via ORAL
  Filled 2011-11-20: qty 6

## 2011-11-20 MED ORDER — METOCLOPRAMIDE HCL 5 MG/ML IJ SOLN
10.00 mg | Freq: Once | INTRAMUSCULAR | Status: AC
Start: 2011-11-20 — End: 2011-11-20
  Administered 2011-11-20: 10 mg via INTRAVENOUS
  Filled 2011-11-20: qty 2

## 2011-11-20 MED ORDER — PANTOPRAZOLE SODIUM 40 MG IV SOLR
40.00 mg | Freq: Once | INTRAVENOUS | Status: AC
Start: 2011-11-20 — End: 2011-11-20
  Administered 2011-11-20: 40 mg via INTRAVENOUS
  Filled 2011-11-20: qty 40

## 2011-11-20 MED ORDER — METHADONE HCL 10 MG PO TABS
58.0000 mg | ORAL_TABLET | Freq: Once | ORAL | Status: DC
Start: 2011-11-20 — End: 2011-11-20

## 2011-11-20 MED ORDER — METOCLOPRAMIDE HCL 10 MG PO TABS
10.00 mg | ORAL_TABLET | Freq: Four times a day (QID) | ORAL | Status: AC | PRN
Start: 2011-11-20 — End: 2011-11-25

## 2011-11-20 MED ORDER — SODIUM CHLORIDE 0.9 % IV BOLUS
1000.0000 mL | Freq: Once | INTRAVENOUS | Status: AC
Start: 2011-11-20 — End: 2011-11-20
  Administered 2011-11-20: 1000 mL via INTRAVENOUS

## 2011-11-20 NOTE — Discharge Instructions (Signed)
Your EKG, labs, ultrasound & x-rays today do not show any serious abnormalities.  Recommend follow-up with your PCP for further work-up, to possibly include colonoscopy for further evaluation of your chronic abdominal pain.

## 2011-11-20 NOTE — ED Notes (Signed)
Pt up and to the bathroom to provide specimen. Pt reports she is tolerating the saltines well and is ready to advance diet. Pt given boxed meal and ginger ale.

## 2011-11-20 NOTE — ED Notes (Signed)
Breakfast tray arrived; given to pt

## 2011-11-20 NOTE — ED Notes (Signed)
Pharmacy called; methadone dose will be arriving

## 2011-11-20 NOTE — ED Notes (Signed)
Spoke to [place] at the methadone clinic dose was verified 58 mg last dose was on  11/19/11

## 2011-11-20 NOTE — ED Triage Note (Signed)
C/O periumbilical pain with vomiting x 5 days.  Says she also has dizziness which started this morning.  Seen 1 week ago at [facility] with several somatic complaints: headache, shoulder pain, stress.

## 2011-11-20 NOTE — Progress Notes (Signed)
Called patient, she was seen in ED for abd pain and vomiting, had several tests, advised to f/u with provider, feels a little better with prescribed meds, also reports she needs to resume her BP meds which she does not have any. I discussed with pcp due to high no show rate, advised to schedule tomorrow, advised patient of this, agreed to appt and plans on being here.

## 2011-11-20 NOTE — ED Notes (Signed)
First encounter with patient for discharge, instructions and prescription given, patient pain free.

## 2011-11-20 NOTE — Telephone Encounter (Signed)
Message copied by Emelia Loron on Thu Nov 20, 2011  4:01 PM  ------       Message from: [name]       Created: Thu Nov 20, 2011  2:25 PM       Regarding: sick:   [facility] for Stomach Pain- No SHow 58%                       Oyinkansola Truax [id number], 56 year old, female, Telephone Information:       Home Phone      [phone]       Work Phone      Not on file.       Mobile          [phone]       Home Phone      Not on file.                     Patient's Preferred Pharmacy:               [facility] - [place], [place] - [address]       Phone: [phone] Fax: [phone]                                   CALL BACK NUMBER:[phone]        Best time to call back:        Cell phone:        Other phone:              Available times:              Patient's language of care: English              Patient does not need an interpreter.              Patient's PCP: Hildred Laser, MD              Person calling on behalf of patient: Patient (self)       sick:   [facility] for Stomach Pain- No SHow 58%       Calls today NEEDS APPOINTMENT                        ------

## 2011-11-20 NOTE — ED Notes (Signed)
Pt concerned about missing her methadone dose today. Pt requesting we contact her methadone clinic. Pt states she is seen at [facility] in [place]- at [address]; ph# [phone]. Pt's ID: [id number]

## 2011-11-20 NOTE — ED Notes (Signed)
Pt finished in ultrasound; pt now otf to x-ray
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
## 2011-11-20 NOTE — ED Notes (Signed)
Pt reports she is hungry; requesting breakfast

## 2011-11-20 NOTE — ED Notes (Signed)
Pt back to floor from imagine. Reports her pain is better. Reports she was feeling quite nauseated during imaging, but this feeling has subsided. Pt asking about methadone, provided with updates.

## 2011-11-21 ENCOUNTER — Ambulatory Visit (HOSPITAL_BASED_OUTPATIENT_CLINIC_OR_DEPARTMENT_OTHER): Payer: MEDICARE | Admitting: Internal Medicine

## 2011-11-21 LAB — URINE CULTURE/COLONY COUNT

## 2011-11-21 LAB — EKG

## 2011-12-02 ENCOUNTER — Other Ambulatory Visit (HOSPITAL_COMMUNITY): Payer: Self-pay | Admitting: Endocrinology

## 2011-12-02 DIAGNOSIS — C73 Malignant neoplasm of thyroid gland: Secondary | ICD-10-CM

## 2011-12-10 ENCOUNTER — Ambulatory Visit (HOSPITAL_BASED_OUTPATIENT_CLINIC_OR_DEPARTMENT_OTHER): Payer: MEDICARE

## 2011-12-10 DIAGNOSIS — Z1211 Encounter for screening for malignant neoplasm of colon: Secondary | ICD-10-CM

## 2011-12-10 LAB — FECAL OCCULT (POINT OF CARE) HOME TEST 3 CARDS
LOT #: 1831
OCCULT BLOOD: NEGATIVE
OCCULT BLOOD: NEGATIVE
OCCULT BLOOD: NEGATIVE

## 2011-12-10 NOTE — Progress Notes (Signed)
Specimen collected  [facility] [place], 12/10/2011, 10:12 AM

## 2011-12-11 ENCOUNTER — Encounter (HOSPITAL_BASED_OUTPATIENT_CLINIC_OR_DEPARTMENT_OTHER): Payer: Self-pay | Admitting: Internal Medicine

## 2011-12-11 ENCOUNTER — Ambulatory Visit (HOSPITAL_BASED_OUTPATIENT_CLINIC_OR_DEPARTMENT_OTHER): Payer: MEDICARE | Admitting: Internal Medicine

## 2011-12-11 VITALS — BP 99/70 | HR 60 | Temp 97.6°F | Wt 196.0 lb

## 2011-12-11 DIAGNOSIS — F329 Major depressive disorder, single episode, unspecified: Secondary | ICD-10-CM

## 2011-12-11 DIAGNOSIS — IMO0001 Reserved for inherently not codable concepts without codable children: Secondary | ICD-10-CM

## 2011-12-11 DIAGNOSIS — F32A Depression, unspecified: Secondary | ICD-10-CM

## 2011-12-11 MED ORDER — OMEPRAZOLE 20 MG PO CPDR
20.0000 mg | DELAYED_RELEASE_CAPSULE | Freq: Every day | ORAL | Status: DC
Start: 2011-12-11 — End: 2011-12-15

## 2011-12-11 NOTE — Progress Notes (Signed)
56 y/o F PCP Dr Diona Foley here for urgent care eval of acid taste with nausea.    #Acid taste with nausea:  Episodic X ~ 9 months.  Worse X 2 weeks.  Associated with occasional burning epigastric sensation, sour taste in mouth and occasional nausea when the acid taste in the mouth comes.  No overt abd pain.  No blood in stool.  No vomiting.  No diarrhea.  Occasional constipation (on methadone, usually BM every day or so, relieved with OTC care).  Drinks soda daily, cut down.  No previously listed family hx of GI cancer.    ROS: No fever, no shortness of breath, no chest pain, no overt blood per rectum, would like SSRI refill    Medical Problems Include:  -Anxiety  -Depression  -Fibrocystic breast  -Elevated BP  -OA  -Chronic low back pain  -Methadone maintenance    PE: 99/70, 97.6, 196 lb, 60, 97%  Most Recent Weight Reading(s)  12/11/11 : 196 lb (88.905 kg)  11/20/11 : 185 lb (83.915 kg)  12/20/10 : 194 lb (87.998 kg)  11/06/10 : 198 lb (89.812 kg)  09/26/10 : 200 lb 9.6 oz (90.992 kg)  General: Well-appearing.  HEENT: Oral mucousa is pink and moist.    ABD: Soft NT, ND.   Neuro: Normal gait.   Skin: No overt rashes or lesions.    A&P  #Chronic, episodic dyspepsia: Likely GERD in part related to obesity and soda intake. No overt signs nor sx of GI ulcer, nor malignancy nor infection at this time.  If sx persist, referral for EGD.  -Trial omeprazole  -H pylori  -Counseled to discontinue / reduce soda  -Counseled re signs + sx to prompt re-eval    #Hx depression and anxiety: Pt notes that wellbutrin was recently started in the ED at 150 mg.  This dose is different from what she has been on in the past.  -Refill Wellbutrin 150  -Refer counseling  -Follow-up with PCP to optimize medication dose    #F/U with PCP in 3-4 weeks.

## 2011-12-12 LAB — HELICOBACTER PYLORI IGG: HELICOBACTER PYLORI IGG: POSITIVE — AB

## 2011-12-13 NOTE — Progress Notes (Signed)
Quick Note:    Please send letter:    Stool cards are negative for blood. We should repeat this test in 1 year to detect colon cancer early.    ______

## 2011-12-15 ENCOUNTER — Telehealth (HOSPITAL_BASED_OUTPATIENT_CLINIC_OR_DEPARTMENT_OTHER): Payer: Self-pay

## 2011-12-15 ENCOUNTER — Encounter (HOSPITAL_BASED_OUTPATIENT_CLINIC_OR_DEPARTMENT_OTHER): Payer: Self-pay | Admitting: Internal Medicine

## 2011-12-15 DIAGNOSIS — A048 Other specified bacterial intestinal infections: Secondary | ICD-10-CM | POA: Insufficient documentation

## 2011-12-15 MED ORDER — AMOXICILLIN 500 MG PO TABS
1000.00 mg | ORAL_TABLET | Freq: Two times a day (BID) | ORAL | Status: AC
Start: 2011-12-15 — End: 2011-12-25

## 2011-12-15 MED ORDER — OMEPRAZOLE 20 MG PO CPDR
20.0000 mg | DELAYED_RELEASE_CAPSULE | Freq: Two times a day (BID) | ORAL | Status: AC
Start: 2011-12-15 — End: 2012-01-14

## 2011-12-15 MED ORDER — CLARITHROMYCIN 500 MG PO TABS
500.00 mg | ORAL_TABLET | Freq: Two times a day (BID) | ORAL | Status: AC
Start: 2011-12-15 — End: 2011-12-25

## 2011-12-15 NOTE — Telephone Encounter (Signed)
Message copied by Emelia Loron on Mon Dec 15, 2011  3:00 PM  ------       Message from: [name]       Created: Mon Dec 15, 2011  2:49 PM       Regarding: SIck:  Patient still nausa                       Cheryl Dixon [id number], 56 year old, female, Telephone Information:       Home Phone      [phone]       Work Phone      Not on file.       Mobile          [phone]       Home Phone      Not on file.                     Patient's Preferred Pharmacy:               [facility] - [place], [place] - [phone] [facility]       Phone: [phone] Fax: [phone]                                   [name] NUMBER:  [phone]              Best time to call back:        Cell phone:        Other phone:              Available times:              Patient's language of care: English              Patient does not need an interpreter.              Patient's PCP: Hildred Laser, MD              Person calling on behalf of patient: Patient (self)              Calls today with a sick call.                       ------

## 2011-12-15 NOTE — Progress Notes (Signed)
Called patient, she reports nausea is continuing, she is taking omeprazole as prescribed, she reports it does not help, she has cut back on soda intake and smoking.    Advised patient she is positive for H. Pylori, advised her treatment for this is increasing omeprazole to twice a day and also 2 antibiotics twice a day for 2 weeks. Advised patient these medications may also cause nausea but at end of treatment her symptoms may be better. Patient verbalized understanding.    Patient confirmed pharmacy as [facility], she denies any other allergies than those listed in epic-darvon and meperidine. Advised her to check at pharmacy later today.

## 2011-12-15 NOTE — Progress Notes (Signed)
H pylori positive.  Will treat with triple therapy.  Left message on voicemail.  Rx to pharmacy.  Will ask RN to call pt.

## 2011-12-19 ENCOUNTER — Ambulatory Visit (HOSPITAL_BASED_OUTPATIENT_CLINIC_OR_DEPARTMENT_OTHER): Payer: MEDICARE | Admitting: Psychosomatic Medicine

## 2011-12-26 ENCOUNTER — Ambulatory Visit (HOSPITAL_BASED_OUTPATIENT_CLINIC_OR_DEPARTMENT_OTHER): Payer: MEDICARE | Admitting: Psychosomatic Medicine

## 2012-01-02 ENCOUNTER — Ambulatory Visit (HOSPITAL_BASED_OUTPATIENT_CLINIC_OR_DEPARTMENT_OTHER): Payer: MEDICARE | Admitting: Psychosomatic Medicine

## 2012-01-05 ENCOUNTER — Encounter (HOSPITAL_COMMUNITY)
Admission: RE | Admit: 2012-01-05 | Discharge: 2012-01-05 | Disposition: A | Discharge status: Home / Self Care | Payer: 59 | Source: Ambulatory Visit | Attending: Endocrinology | Admitting: Endocrinology

## 2012-01-05 DIAGNOSIS — C73 Malignant neoplasm of thyroid gland: Secondary | ICD-10-CM | POA: Insufficient documentation

## 2012-01-05 MED ORDER — THYROTROPIN ALFA 1.1 MG IM SOLR
0.9000 mg | INTRAMUSCULAR | Status: AC
  Administered 2012-01-05: 0.9 mg via INTRAMUSCULAR
  Filled 2012-01-05: qty 0.9

## 2012-01-06 ENCOUNTER — Ambulatory Visit (HOSPITAL_COMMUNITY): Payer: 59

## 2012-01-06 MED ORDER — THYROTROPIN ALFA 1.1 MG IM SOLR
0.9000 mg | INTRAMUSCULAR | Status: AC
  Administered 2012-01-06: 0.9 mg via INTRAMUSCULAR
  Filled 2012-01-06: qty 0.9

## 2012-01-07 ENCOUNTER — Encounter (HOSPITAL_COMMUNITY)
Admission: RE | Admit: 2012-01-07 | Discharge: 2012-01-07 | Disposition: A | Discharge status: Home / Self Care | Payer: 59 | Source: Ambulatory Visit | Attending: Endocrinology | Admitting: Endocrinology

## 2012-01-07 DIAGNOSIS — C73 Malignant neoplasm of thyroid gland: Secondary | ICD-10-CM | POA: Insufficient documentation

## 2012-01-07 MED ORDER — SODIUM IODIDE I 131 CAPSULE
4.0000 | Freq: Once | INTRAVENOUS | Status: AC | PRN
  Administered 2012-01-07: 4 via ORAL

## 2012-01-08 ENCOUNTER — Ambulatory Visit (HOSPITAL_BASED_OUTPATIENT_CLINIC_OR_DEPARTMENT_OTHER): Payer: MEDICARE | Admitting: Internal Medicine

## 2012-01-09 ENCOUNTER — Encounter (HOSPITAL_COMMUNITY)
Admission: RE | Admit: 2012-01-09 | Discharge: 2012-01-09 | Disposition: A | Discharge status: Home / Self Care | Payer: 59 | Source: Ambulatory Visit | Attending: Endocrinology | Admitting: Endocrinology

## 2012-01-09 MED ORDER — SODIUM IODIDE I 131 CAPSULE
4.0000 | Freq: Once | INTRAVENOUS | Status: AC | PRN
  Administered 2012-01-09: 4 via ORAL

## 2012-01-12 ENCOUNTER — Other Ambulatory Visit (HOSPITAL_BASED_OUTPATIENT_CLINIC_OR_DEPARTMENT_OTHER): Payer: Self-pay | Admitting: Internal Medicine

## 2012-01-12 ENCOUNTER — Other Ambulatory Visit: Payer: Self-pay | Admitting: Obstetrics and Gynecology

## 2012-01-12 DIAGNOSIS — Z1231 Encounter for screening mammogram for malignant neoplasm of breast: Secondary | ICD-10-CM

## 2012-01-13 NOTE — Progress Notes (Signed)
Person calling on behalf of patient: Pharmacy    Cheryl Dixon is a 56 year old female       - Medication(s) request: Lisinopril 5mg   - Last office visit: 12/11/11  - Last physical exam: 01/04/10      HTN Med:    Most Recent BP Reading(s)  12/11/11 : 99/70  11/20/11 : 158/90  01/20/11 : 108/73        Documented patient preferred pharmacies:    [name] - [place],  - [address]  Phone: [phone] Fax: [phone]

## 2012-01-19 ENCOUNTER — Ambulatory Visit
Admission: RE | Admit: 2012-01-19 | Discharge: 2012-01-19 | Disposition: A | Discharge status: Home / Self Care | Payer: 59 | Source: Ambulatory Visit | Attending: Obstetrics and Gynecology | Admitting: Obstetrics and Gynecology

## 2012-01-19 DIAGNOSIS — Z1231 Encounter for screening mammogram for malignant neoplasm of breast: Secondary | ICD-10-CM

## 2012-02-05 ENCOUNTER — Telehealth (HOSPITAL_BASED_OUTPATIENT_CLINIC_OR_DEPARTMENT_OTHER): Payer: Self-pay

## 2012-02-05 NOTE — Progress Notes (Signed)
Letter completed and placed in out-box to be faxed.

## 2012-02-05 NOTE — Progress Notes (Signed)
Received request for utility letter, ? Gas, tried to call patient to determine if it's for gas, no answer, left message on voicemail to call clinic back. Letter drafted. Will await call back from patient, she prefers letter be mailed to her.

## 2012-02-05 NOTE — Progress Notes (Signed)
Patient returned call, confirmed letter is for gas.

## 2012-02-05 NOTE — Telephone Encounter (Signed)
Message copied by Emelia Loron on Thu Feb 05, 2012 11:20 AM  ------       Message from: Kathrin Penner       Created: Thu Feb 05, 2012 10:23 AM       Regarding: LETTER TO BE MAILED       Contact: [phone]                       Cheryl Dixon [id number], 56 year old, female, Telephone Information:       Home Phone      [phone]       Work Phone      Not on file.       Mobile          [phone]       Home Phone      Not on file.                     Patient's Preferred Pharmacy:               [name] - [place], [place] - [phone] [facility]       Phone: [phone] Fax: [phone]                     CONFIRMED TODAY: Lovett Sox NUMBER:  [phone]              \       Patient's language of care: English              Patient does not need an interpreter.              Patient's PCP: Hildred Laser, MD              Person calling on behalf of patient: Patient (self)              Calls today [employer]       # [phone]       NAME ON SAME AS PATIENT               PLEASE MAIL TO PATIENT'                       ------

## 2012-03-09 ENCOUNTER — Other Ambulatory Visit (HOSPITAL_BASED_OUTPATIENT_CLINIC_OR_DEPARTMENT_OTHER): Payer: Self-pay | Admitting: Internal Medicine

## 2012-03-09 NOTE — Telephone Encounter (Signed)
Spoke with patient will schedule mammogram after the holidays.

## 2012-05-15 ENCOUNTER — Emergency Department (HOSPITAL_BASED_OUTPATIENT_CLINIC_OR_DEPARTMENT_OTHER)
Admission: RE | Admit: 2012-05-15 | Disposition: A | Discharge status: Home / Self Care | Payer: Self-pay | Source: Emergency Department | Attending: Emergency Medicine | Admitting: Emergency Medicine

## 2012-05-15 ENCOUNTER — Encounter (HOSPITAL_BASED_OUTPATIENT_CLINIC_OR_DEPARTMENT_OTHER): Payer: Self-pay

## 2012-05-15 LAB — CBC WITH PLATELET
HEMATOCRIT: 47.2 % — ABNORMAL HIGH (ref 34.1–44.9)
HEMOGLOBIN: 14.9 g/dL (ref 11.2–15.7)
MEAN CORP HGB CONC: 31.6 g/dL (ref 31.0–37.0)
MEAN CORPUSCULAR HGB: 26.9 pg (ref 26.0–34.0)
MEAN CORPUSCULAR VOL: 85.2 fL (ref 80.0–100.0)
MEAN PLATELET VOLUME: 9.7 fL (ref 8.7–12.5)
PLATELET COUNT: 268 10*3/uL (ref 150–400)
RBC DISTRIBUTION WIDTH STD DEV: 41.2 fL (ref 35.1–46.3)
RBC DISTRIBUTION WIDTH: 13.2 % (ref 11.5–14.3)
RED BLOOD CELL COUNT: 5.54 M/uL — ABNORMAL HIGH (ref 3.90–5.20)
WHITE BLOOD CELL COUNT: 8.9 10*3/uL (ref 4.0–11.0)

## 2012-05-15 LAB — COMPREHENSIVE METABOLIC PANEL
ALANINE AMINOTRANSFERASE: 15 IU/L (ref 7–35)
ALBUMIN: 4.7 g/dl (ref 3.4–4.8)
ALKALINE PHOSPHATASE: 59 IU/L (ref 25–106)
ANION GAP: 10 mmol/L (ref 3–11)
ASPARTATE AMINOTRANSFERASE: 18 IU/L (ref 8–34)
BILIRUBIN TOTAL: 0.8 mg/dl (ref 0.2–1.1)
BUN (UREA NITROGEN): 12 mg/dl (ref 6–20)
CALCIUM: 9.6 mg/dl (ref 8.6–10.3)
CARBON DIOXIDE: 28 mmol/L (ref 22–32)
CHLORIDE: 101 mmol/L (ref 101–111)
CREATININE: 0.9 mg/dl (ref 0.4–1.2)
ESTIMATED GLOMERULAR FILT RATE: >60 mL/min (ref >60)
Glucose Random: 118 mg/dl (ref 74–160)
POTASSIUM: 4 mmol/L (ref 3.5–5.1)
SODIUM: 139 mmol/L (ref 135–144)
TOTAL PROTEIN: 7.4 g/dl (ref 5.9–7.5)

## 2012-05-15 LAB — HOLD GREEN TOP TUBE

## 2012-05-15 LAB — HOLD BLUE TOP TUBE

## 2012-05-15 LAB — US ABDOMEN COMPLETE

## 2012-05-15 LAB — LIPASE: LIPASE: 23 U/L (ref 10–50)

## 2012-05-15 MED ORDER — PANTOPRAZOLE SODIUM 40 MG IV SOLR
40.00 mg | Freq: Once | INTRAVENOUS | Status: AC
Start: 2012-05-15 — End: 2012-05-15
  Administered 2012-05-15: 40 mg via INTRAVENOUS
  Filled 2012-05-15: qty 40

## 2012-05-15 MED ORDER — METHADONE HCL 10 MG PO TABS
58.0000 mg | ORAL_TABLET | Freq: Once | ORAL | Status: DC
Start: 2012-05-15 — End: 2012-05-15

## 2012-05-15 MED ORDER — ONDANSETRON HCL 4 MG/2ML IJ SOLN
INTRAMUSCULAR | Status: AC
Start: 2012-05-15 — End: 2012-05-15
  Administered 2012-05-15: 07:00:00
  Filled 2012-05-15: qty 2

## 2012-05-15 MED ORDER — OMEPRAZOLE 20 MG PO CPDR
20.0000 mg | DELAYED_RELEASE_CAPSULE | Freq: Every day | ORAL | Status: DC
Start: 2012-05-15 — End: 2012-07-13

## 2012-05-15 MED ORDER — ONDANSETRON HCL 4 MG/2ML IJ SOLN
4.00 mg | Freq: Once | INTRAMUSCULAR | Status: AC
Start: 2012-05-15 — End: 2012-05-15
  Administered 2012-05-15: 4 mg via INTRAVENOUS

## 2012-05-15 MED ORDER — SODIUM CHLORIDE 0.9 % IV BOLUS
1000.00 mL | Freq: Once | INTRAVENOUS | Status: AC
Start: 2012-05-15 — End: 2012-05-15
  Administered 2012-05-15: 1000 mL via INTRAVENOUS

## 2012-05-15 MED ORDER — FAMOTIDINE 20 MG PO TABS
20.00 mg | ORAL_TABLET | Freq: Two times a day (BID) | ORAL | Status: AC
Start: 2012-05-15 — End: 2012-05-29

## 2012-05-15 MED ORDER — METHADONE HCL 10 MG PO TABS
60.00 mg | ORAL_TABLET | Freq: Once | ORAL | Status: AC
Start: 2012-05-15 — End: 2012-05-15
  Administered 2012-05-15: 60 mg via ORAL
  Filled 2012-05-15: qty 6

## 2012-05-15 NOTE — ED Notes (Signed)
Patient Disposition    Patient education for diagnosis, medications, activity, diet and follow-up.  Patient left ED 11:42 AM.  Patient received written instructions. Pt verbalized understanding of D/C instructions.    Discharged home un accompany, pt amb from dept.

## 2012-05-15 NOTE — ED Notes (Addendum)
Entry on wrong chart

## 2012-05-15 NOTE — ED Notes (Signed)
Feels better after zofran, IV NS infusing.  Methadone dose given to pt's great relief.  Is resting quietly.

## 2012-05-15 NOTE — ED Notes (Signed)
Patient Disposition    Patient education for diagnosis, medications, activity, diet and follow-up.  Patient left ED 10:23 AM.  Patient rep received written instructions.  Interpreter to provide instructions: No    Discharged to: Discharged to home

## 2012-05-15 NOTE — Discharge Instructions (Signed)
You have been given an additional prescription for antacid medication.  It is very important that you follow up with your primary doctor to discuss possible further treatment for h Pylori.    Gastritis  Gastritis is an inflammation (the body's way of reacting to injury and/or infection) of the stomach. It is often caused by viral or bacterial (germ) infections. It can also be caused by chemicals (including alcohol) and medications. This illness may be associated with generalized malaise (feeling tired, not well), cramps, and fever. The illness may last 2 to 7 days. If symptoms of gastritis continue, gastroscopy (looking into the stomach with a telescope-like instrument), biopsy (taking tissue samples), and/or blood tests may be necessary to determine the cause. Antibiotics will not affect the illness unless there is a bacterial infection present. One common bacterial cause of gastritis is an organism known as H. Pylori. This can be treated with antibiotics. Other forms of gastritis are caused by too much acid in the stomach. They can be treated with medications such as H2 blockers and antacids. Home treatment is usually all that is needed. Young children will quickly become dehydrated (loss of body fluids) if vomiting and diarrhea are both present. Medications may be given to control nausea. Medications are usually not given for diarrhea unless especially bothersome. Some medications slow the removal of the virus from the gastrointestinal tract. This slows down the healing process.  HOME CARE INSTRUCTIONS  Home care instructions for nausea and vomiting:   For adults: drink small amounts of fluids often. Drink at least 2 quarts a day. Take sips frequently. Do not drink large amounts of fluid at one time. This may worsen the nausea.   Only take over-the-counter or prescription medicines for pain, discomfort, or fever as directed by your caregiver.   Drink clear liquids only. Those are anything you can see through  such as water, broth, or soft drinks.   Once you are keeping clear liquids down, you may start full liquids, soups, juices, and ice cream or sherbet. Slowly add bland (plain, not spicy) foods to your diet.  Home care instructions for diarrhea:   Diarrhea can be caused by bacterial infections or a virus. Your condition should improve with time, rest, fluids, and/or anti-diarrheal medication.   Until your diarrhea is under control, you should drink clear liquids often in small amounts. Clear liquids include: water, broth, jell-o water and weak tea.  Avoid:   Milk.   Fruits.   Tobacco.   Alcohol.   Extremely hot or cold fluids.   Too much intake of anything at one time.  When your diarrhea stops you may add the following foods, which help the stool to become more formed:   Rice.   Bananas.   Apples without skin.   Dry toast.  Once these foods are tolerated you may add low-fat yogurt and low-fat cottage cheese. They will help to restore the normal bacterial balance in your bowel.  Wash your hands well to avoid spreading bacteria (germ) or virus.  SEEK IMMEDIATE MEDICAL CARE IF:    You are unable to keep fluids down.   Vomiting or diarrhea become persistent (constant).   Abdominal pain develops, increases, or localizes. (Right sided pain can be appendicitis. Left sided pain in adults can be diverticulitis.)   You develop a fever (an oral temperature above 102 F (38.9 C)).   Diarrhea becomes excessive or contains blood or mucus.   You have excessive weakness, dizziness, fainting or extreme thirst.  You are not improving or you are getting worse.   You have any other questions or concerns.  Document Released: 04/08/2001 Document Revised: 07/07/2011 Document Reviewed: 05/28/2011  [facility] Patient Information 2013 [place], [place].
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
## 2012-05-15 NOTE — ED Notes (Signed)
Pt to US via WC

## 2012-05-15 NOTE — ED Notes (Signed)
Spoke with Rosanne Ashing at [facility] [phone], he verified the pt's methadone dose as 58mg .

## 2012-05-15 NOTE — ED Notes (Signed)
Spoke with pt about being seen.  Pt is scared to be going home on the same medication that she is already on.  MD notified.

## 2012-05-15 NOTE — ED Triage Note (Signed)
Pt presents back to triage area after just being discharged from main ED bed #3, Dx'd w/recurrent gastritis in the setting of self-reported H-pylori.  Comes back visably upset/crying B/C she was "sent home on the exact same med I have at home and I don't want to just go home and be in pain."    Requesting [facility] Rx for Rx of H-pylori.

## 2012-05-15 NOTE — ED Notes (Signed)
Pt amb around room and using phone

## 2012-05-15 NOTE — Discharge Instructions (Signed)
Your testing today revealed no gallstones and no other serious abnormality.  Your symptoms are likely due to stomach acid and you have been restarted on an antacid medication.  Follow up with your primary doctor.    Gastritis  Gastritis is an inflammation (the body's way of reacting to injury and/or infection) of the stomach. It is often caused by viral or bacterial (germ) infections. It can also be caused by chemicals (including alcohol) and medications. This illness may be associated with generalized malaise (feeling tired, not well), cramps, and fever. The illness may last 2 to 7 days. If symptoms of gastritis continue, gastroscopy (looking into the stomach with a telescope-like instrument), biopsy (taking tissue samples), and/or blood tests may be necessary to determine the cause. Antibiotics will not affect the illness unless there is a bacterial infection present. One common bacterial cause of gastritis is an organism known as H. Pylori. This can be treated with antibiotics. Other forms of gastritis are caused by too much acid in the stomach. They can be treated with medications such as H2 blockers and antacids. Home treatment is usually all that is needed. Young children will quickly become dehydrated (loss of body fluids) if vomiting and diarrhea are both present. Medications may be given to control nausea. Medications are usually not given for diarrhea unless especially bothersome. Some medications slow the removal of the virus from the gastrointestinal tract. This slows down the healing process.  HOME CARE INSTRUCTIONS  Home care instructions for nausea and vomiting:   For adults: drink small amounts of fluids often. Drink at least 2 quarts a day. Take sips frequently. Do not drink large amounts of fluid at one time. This may worsen the nausea.   Only take over-the-counter or prescription medicines for pain, discomfort, or fever as directed by your caregiver.   Drink clear liquids only. Those are  anything you can see through such as water, broth, or soft drinks.   Once you are keeping clear liquids down, you may start full liquids, soups, juices, and ice cream or sherbet. Slowly add bland (plain, not spicy) foods to your diet.  Home care instructions for diarrhea:   Diarrhea can be caused by bacterial infections or a virus. Your condition should improve with time, rest, fluids, and/or anti-diarrheal medication.   Until your diarrhea is under control, you should drink clear liquids often in small amounts. Clear liquids include: water, broth, jell-o water and weak tea.  Avoid:   Milk.   Fruits.   Tobacco.   Alcohol.   Extremely hot or cold fluids.   Too much intake of anything at one time.  When your diarrhea stops you may add the following foods, which help the stool to become more formed:   Rice.   Bananas.   Apples without skin.   Dry toast.  Once these foods are tolerated you may add low-fat yogurt and low-fat cottage cheese. They will help to restore the normal bacterial balance in your bowel.  Wash your hands well to avoid spreading bacteria (germ) or virus.  SEEK IMMEDIATE MEDICAL CARE IF:    You are unable to keep fluids down.   Vomiting or diarrhea become persistent (constant).   Abdominal pain develops, increases, or localizes. (Right sided pain can be appendicitis. Left sided pain in adults can be diverticulitis.)   You develop a fever (an oral temperature above 102 F (38.9 C)).   Diarrhea becomes excessive or contains blood or mucus.   You have excessive weakness, dizziness,  fainting or extreme thirst.   You are not improving or you are getting worse.   You have any other questions or concerns.  Document Released: 04/08/2001 Document Revised: 07/07/2011 Document Reviewed: 05/28/2011  [facility] Patient Information 2013 [place], [place].

## 2012-05-15 NOTE — ED Triage Note (Signed)
"  abd pain with n/v for a wk. Pain worse p 3 days"  Indicates entire abd for pain "10/10"

## 2012-05-15 NOTE — ED Notes (Signed)
Assumed care of pt. Pt sitting on stretcher. texting on  Phone. Will medicate as ordered

## 2012-05-15 NOTE — ED Notes (Signed)
Pt requesting cab voucher, wait in ED waiting room.

## 2012-05-15 NOTE — ED Notes (Signed)
Pt requesting crackers and gingerale. Pt states minimal pain. Will let MD aware of requests from pt.

## 2012-05-17 ENCOUNTER — Telehealth (HOSPITAL_BASED_OUTPATIENT_CLINIC_OR_DEPARTMENT_OTHER): Payer: Self-pay | Admitting: Ambulatory Care

## 2012-05-17 NOTE — ED Provider Notes (Signed)
I have reviewed the ED nursing notes and prior records. I have reviewed the patient's past medical history/problem list, allergies, social history and medication list.      HPI:  Arrival mode:  Self    This 57 year old female patient presents with chief complaint of medication question.  Pt just discharged after evaluation of epigastric pain felt likely to be recurrent gastritis - see encounter note for details, came back requesting re-evaluation of medication plan.  Pt states that she is taking prilosec already (having previously discontinued it) and is worried it will not be sufficient.    Review of Systems:  Constitutional:   No fevers, no chills  Cardiovascular:   No chest pain, no palpitations  Respiratory:  No SOB, no wheeze  Gastrointestinal: No nausea, no vomiting, no diarrhea  Skin:   No rash, no pruritis    Pertinent positives were reviewed as per the HPI above. All other systems were reviewed and are negative.    Past Medical History/Problem List:    Past Medical History    Arthritis     Varicose veins of lower extremities     Depression     Substance addiction     Obese     Anxiety      Patient Active Problem List:     ANXIETY STATE NOS     Opioid type dependence, in remission     TOBACCO USE DISORDER     Fibrocystic breast     Depression     Elevated BP     Knee pain     Chronic low back pain     OA (osteoarthritis) of knee     H. pylori infection      Past Surgical History:      Past Surgical History    ANES HRNA REPAIR UPR ABD TABDL RPR DIPHRG HRNA      ENDOMETRIAL ABLTJ THERMAL W/O HYSTEROSCOPIC GID      ANES IPER LOWER ABD W/LAPS RAD HYSTERECTOMY      RPR 1ST INGUN HRNA PRETERM INFT RDC         Medications:     No current facility-administered medications on file prior to encounter.  Current Outpatient Prescriptions on File Prior to Encounter:  omeprazole (PRILOSEC) 20 MG capsule Take 1 capsule by mouth daily. Disp: 30 capsule Rfl: 0   lisinopril (PRINIVIL,ZESTRIL) 5 MG tablet Take 1 tablet by mouth  daily. Please make follow up appointment with Dr. Diona Foley Disp: 30 tablet Rfl: 1   buPROPion (WELLBUTRIN XL) 150 MG 24 hr tablet Take 1 tablet by mouth every morning. For mood. Disp: 30 tablet Rfl: 0   clonazepam (KLONOPIN) 0.5 MG tablet Take 1 tablet by mouth daily as needed for Anxiety. Disp: 30 tablet Rfl: 0   methadone (DOLOPHINE) 10 mg/mL solution Take 58 mg by mouth daily. 52 mg - 09/2010 Disp: 1 mL Rfl: 0   albuterol (PROAIR HFA) 108 (90 BASE) MCG/ACT inhaler Inhale 2 puffs into the lungs every 6 (six) hours as needed for Shortness of breath (cough) for 7 days. Disp: 1 Inhaler Rfl: 0   DOCUSATE SODIUM 100 MG OR CAPS 2 CAPSULES DAILY Disp: 60 Rfl: 11       Social History:     Smoking status: Current Every Day Smoker  1.00 Packs/Day  For 5.00 Years     Types: Cigarettes    Smokeless tobacco:     Comment: quit 1980-1996, then light until 2003, then 1 ppd since  Alcohol Use: No       Allergies:  Review of Patient's Allergies indicates:   Darvon                     Meperidine hcl              Comment:Nausea/vomit    Physical Exam:  ED Triage Vitals   Enc Vitals Group      BP 05/15/12 1110 121/96 mmHg      Pulse 05/15/12 1110 87      Resp 05/15/12 1110 20      Temp 05/15/12 1110 98.9 F      Temp src --       SpO2 05/15/12 1110 97 %      Weight --       Height --       Head Cir --       Peak Flow --       Pain Score 05/15/12 1110 10       Pain Loc --       Pain Edu? --       Excl. in GC? --      GENERAL:  Well-appearing, no distress.  SKIN:  Warm & Dry, no rash, no bruising.  HEAD:  Atraumatic. PERRL. Anicteric sclera.   ENT:  Oropharynx clear  NECK:  Supple  LUNGS:  Clear to auscultation bilaterally without rales, rhonchi or wheezing.   HEART:  RRR.  No murmurs, rubs, or gallops.   ABDOMEN:  Soft, flat, without distension.  Nontender to palpation.   MUSCULOSKELETAL:  No deformities. Well-perfused extremities. No cyanosis or edema.  GENITOURINARY:  No CVA tenderness.   NEUROLOGIC:  Normal speech.  alert & oriented x  3  PSYCHIATRIC:  Normal affect, calm & cooperative    ED Course and Medical Decision-making:  Pertinent Labs & Imaging studies reviewed.  ED nursing record was reviewed. Prior records as available electronically through the Epic record were reviewed.      Recent Labs   05/15/12  0615   WBC 8.9   HGB 14.9   HCT 47.2*   PLTA 268   NA 139   K 4.0   CL 101   CO2 28   BUN 12   CREAT 0.9   GLUCOSER 118   CA 9.6   AST 18   ALT 15   TBILI 0.8   ALKPHOS 59        05/15/12  1110   BP: 121/96   Pulse: 87   Temp: 98.9 F   Resp: 20   SpO2: 97%       O2 Sat interpretation:  WNL    Rhythm strip interpretation:  NSR  EKG interpretation:  Rhythm as noted, normal axis, normal intervals, no acute ST changes    Pt just seen for epigastric pain suggestive of recurrent gastritis, returned to ED to discuss medication plan, worried as had restarted prilosec a few days ago.  Pt well appearing, I had extensive discussion with her re: how prilosec takes time to start working and needs to be taken consistently.  Pt expresses understanding, will add pepcid to provide relief more quickly.  ACS is not suspected in this patient, EKG was reviewed and interpretation documented at time of visit but study not entered into computer.  All questions answered, pt agreeable with plan, safe for dc and to rted if worse.    Discharge Medication List as of 05/15/2012 11:29 AM  START taking these medications    famotidine (PEPCID) 20 MG tablet  Take 1 tablet by mouth 2 (two) times daily.Tamperproof, Oral, 2 TIMES DAILY, Disp-60 tablet, R-0            Condition on ED departure:  Improved and stable  PCP:  Hildred Laser, MD    Diagnosis/Diagnoses:  Anola Gurney

## 2012-05-17 NOTE — Progress Notes (Signed)
Er F/U call placed to pt for Gastritis.  Left message with a relative to have pt call clinic at [phone]

## 2012-06-28 ENCOUNTER — Other Ambulatory Visit (HOSPITAL_BASED_OUTPATIENT_CLINIC_OR_DEPARTMENT_OTHER): Payer: Self-pay

## 2012-06-28 NOTE — Telephone Encounter (Signed)
Pt is due for a mammogram. Left message on voicemail for pt to call clinic/pcc to schedule an appt

## 2012-07-13 ENCOUNTER — Ambulatory Visit (HOSPITAL_BASED_OUTPATIENT_CLINIC_OR_DEPARTMENT_OTHER): Payer: MEDICARE | Admitting: Internal Medicine

## 2012-07-13 ENCOUNTER — Other Ambulatory Visit (HOSPITAL_BASED_OUTPATIENT_CLINIC_OR_DEPARTMENT_OTHER): Payer: Self-pay | Admitting: Internal Medicine

## 2012-07-13 ENCOUNTER — Encounter (HOSPITAL_BASED_OUTPATIENT_CLINIC_OR_DEPARTMENT_OTHER): Payer: Self-pay | Admitting: Internal Medicine

## 2012-07-13 VITALS — BP 148/107 | HR 90 | Temp 98.6°F

## 2012-07-13 DIAGNOSIS — F411 Generalized anxiety disorder: Secondary | ICD-10-CM

## 2012-07-13 DIAGNOSIS — IMO0001 Reserved for inherently not codable concepts without codable children: Secondary | ICD-10-CM

## 2012-07-13 DIAGNOSIS — F329 Major depressive disorder, single episode, unspecified: Secondary | ICD-10-CM

## 2012-07-13 DIAGNOSIS — K219 Gastro-esophageal reflux disease without esophagitis: Secondary | ICD-10-CM

## 2012-07-13 DIAGNOSIS — F32A Depression, unspecified: Secondary | ICD-10-CM

## 2012-07-13 MED ORDER — OMEPRAZOLE 20 MG PO CPDR
20.0000 mg | DELAYED_RELEASE_CAPSULE | Freq: Every day | ORAL | Status: DC
Start: 2012-07-13 — End: 2012-08-11

## 2012-07-13 MED ORDER — LISINOPRIL 5 MG PO TABS
5.0000 mg | ORAL_TABLET | Freq: Every day | ORAL | Status: DC
Start: 2012-07-13 — End: 2012-08-11

## 2012-07-13 MED ORDER — HYDROXYZINE HCL 25 MG PO TABS
ORAL_TABLET | ORAL | Status: DC
Start: 2012-07-13 — End: 2012-07-26

## 2012-07-13 MED ORDER — BUPROPION HCL ER (XL) 150 MG PO TB24
150.0000 mg | ORAL_TABLET | Freq: Every morning | ORAL | Status: DC
Start: 2012-07-13 — End: 2012-08-11

## 2012-07-13 NOTE — Progress Notes (Signed)
Cheryl Dixon is a 57 year old female.  She comes in today with chief   complaint/reason for visit of No chief complaint on file.    Anxiety  She is getting much worsening anxiety  It is "horrible"  She can't get out of the house  She can't go to her grandchildren's birthday parties.  It seems to be acutely worrening because she wants to have her son move out of her apartment.  And he won't move out.    She used to be on klonopin and an anti-depressant.  Wellbutrin did help with her depression in the past  Had a rash to zoloft and paxil many years ago.    She went to [place] last year for many counseling sessions but never was able to get into see a psychiatrist.  Then had an [facility] psychiatrist that she couldn't see    GERD  She has been having GERD symtpoms again  Had h.pylori in the past  Was seen this past fall at [facility] for GERD symtpoms  Omeprazole does help  Weight is fluctuating as she is eating a lot, then none at all, then a lot again.    HTN  She did not take her bp med for the past 2 weeks.       Review of Systems   Gastrointestinal: Positive for heartburn and abdominal pain.   Psychiatric/Behavioral: Positive for depression. Negative for suicidal ideas and substance abuse. The patient is nervous/anxious and has insomnia.      Physical Exam   Vitals reviewed.  Constitutional: She is oriented to person, place, and time. She appears well-developed and well-nourished. She appears distressed.   Sitting in chair.  Hands shaking  Crying  Breathing radily   HENT:   Head: Normocephalic and atraumatic.   Eyes: EOM are normal. No scleral icterus.   Neck: Normal range of motion.   Pulmonary/Chest: Effort normal. No respiratory distress.   Neurological: She is alert and oriented to person, place, and time.   Skin: Skin is warm and dry. She is not diaphoretic.   Psychiatric:   Crying  Breathing rapidly         Plan    Assessment and Plan    Please note that non-medical language may be used below as this assessment and plan  is copied into the after-visit summary for the patient.      (300.00) Anxiety state, unspecified  (primary encounter diagnosis)  Comment: Cheryl Dixon comes in with intense anxiety.  I think that ultimately she needs to get in to see a psychiatrist.  I will give her some resources for the community.  I think that   I will give her some atarax for intense anxiety episodes.    Plan: hydrOXYzine (ATARAX) 25 MG tablet            (311) Depression  Comment: For her depression, I will restart her wellbutrin  Plan: buPROPion (WELLBUTRIN XL) 150 MG 24 hr tablet            (530.81) Esophageal reflux  Comment: For her GERD I will give her omeprazole  Plan: omeprazole (PRILOSEC) 20 MG capsule          I have spent 25 minutes in face to face time with this patient/patient proxy of which > 50% was in counseling or coordination of care regarding above issues/Dx.     We discussed all the medications prescribed during this encounter and the importance of medication compliance. The patient was ready  to learn and no apparent learning barriers were identified. I explained the diagnosis and treatment plan, and the patient expressed understanding of the content. Possible side effects of the prescribed medication(s) were explained.  I attempted to answer any questions regarding the diagnosis and the proposed treatment.    We discussed the patients current medications. The patient expressed understanding and no barriers to adherence were identified.    I have reviewed the past medical, surgical, social and family history and updated these sections of EpicCare as relevant. All interim labs, test results, and consult notes were reviewed and discussed with Cheryl Dixon. Medications were reconciled during this visit and a current medication list was given to the patient at the end of the visit.

## 2012-07-13 NOTE — Patient Instructions (Signed)
Dear Cheryl Dixon,    As part of improving patient-doctor communication, I copy and paste my chart notes into this after visit summary for you.  These chart notes are what I record so that I can look back and see what our plan was, and also so that other doctors can see what our plan was.      (300.00) Anxiety state, unspecified  (primary encounter diagnosis)  Comment: Dawana comes in with intense anxiety.  I think that ultimately she needs to get in to see a psychiatrist.  I will give her some resources for the community.  I think that   I will give her some atarax for intense anxiety episodes.    Plan: hydrOXYzine (ATARAX) 25 MG tablet            (311) Depression  Comment: For her depression, I will restart her wellbutrin  Plan: buPROPion (WELLBUTRIN XL) 150 MG 24 hr tablet            (530.81) Esophageal reflux  Comment: For her GERD I will give her omeprazole  Plan: omeprazole (PRILOSEC) 20 MG capsule        For more information on [facility], administered by [facility]:  [phone]                 For your convenience the following is a list of community behavioral health providers:  If you find that you are having difficulty with a referral or finding the right practice for you, please let us know, we would be happy to help  Please note that for all practices, people must be in regular therapy in order to be able to be followed by a psychiatrist    [facility]  [phone] (central intake office)  [facility] has 3 clinics.  You can go either call the central intake line first, or you can do a walk-in visit.  There is a 2-hour window every day when you can do a walk-in visit.  Please call the central intake office to find out the most up to date schedule for the walk-in clinics.    [facility], [address], [place], [phone] or [phone]  -[facility], [address], [place], [phone] or [phone]  [facility], [address] [place],  [phone] or [phone]    [facility]  [address], [place], [place] [address]  [phone] is the intake line for the [facility]    [address] [place], [place]  [phone] is the intake line for the [facility] office    [facility]  [address], [place]  [phone]    [facility]  For children and adolescents and their families  Will work with families in [place] and some parts of [place], have homebased services as well  [phone]    [facility]   Adult Outpatient   [address] [place], [place]  [address] [place] [address]   If interested, please have your PCP make the referral    [facility]  [phone]  [facility] team has outreach (homebased) services for people residing in: [place], [place], [place], [place], [place], [place] and [place] site, [phone].  Must do a referral to their intake line first at [phone]

## 2012-07-13 NOTE — Progress Notes (Signed)
Patient did confirm no SI.    On her PHQ-9 she checked of "3" for number 9, but scratchedc out the "hurting yourself in some way".

## 2012-07-21 ENCOUNTER — Encounter (HOSPITAL_BASED_OUTPATIENT_CLINIC_OR_DEPARTMENT_OTHER): Payer: Self-pay | Admitting: Internal Medicine

## 2012-07-21 ENCOUNTER — Ambulatory Visit (HOSPITAL_BASED_OUTPATIENT_CLINIC_OR_DEPARTMENT_OTHER): Payer: MEDICARE | Admitting: Internal Medicine

## 2012-07-26 ENCOUNTER — Encounter (HOSPITAL_BASED_OUTPATIENT_CLINIC_OR_DEPARTMENT_OTHER): Payer: Self-pay | Admitting: Physician Assistant

## 2012-07-26 ENCOUNTER — Ambulatory Visit (HOSPITAL_BASED_OUTPATIENT_CLINIC_OR_DEPARTMENT_OTHER): Payer: MEDICARE | Admitting: Physician Assistant

## 2012-07-26 VITALS — BP 118/73 | HR 78 | Temp 99.5°F | Wt 200.0 lb

## 2012-07-26 DIAGNOSIS — F329 Major depressive disorder, single episode, unspecified: Secondary | ICD-10-CM

## 2012-07-26 DIAGNOSIS — F411 Generalized anxiety disorder: Secondary | ICD-10-CM

## 2012-07-26 DIAGNOSIS — F32A Depression, unspecified: Secondary | ICD-10-CM

## 2012-07-26 DIAGNOSIS — M25569 Pain in unspecified knee: Secondary | ICD-10-CM

## 2012-07-26 MED ORDER — HYDROXYZINE HCL 50 MG PO TABS
ORAL_TABLET | ORAL | Status: AC
Start: 2012-07-26 — End: 2012-08-09

## 2012-07-26 MED ORDER — SERTRALINE HCL 25 MG PO TABS
25.00 mg | ORAL_TABLET | Freq: Every day | ORAL | Status: AC
Start: 2012-07-26 — End: 2012-08-25

## 2012-07-26 MED ORDER — NAPROXEN 500 MG PO TABS
500.0000 mg | ORAL_TABLET | Freq: Two times a day (BID) | ORAL | Status: DC | PRN
Start: 2012-07-26 — End: 2012-10-24

## 2012-07-26 NOTE — Patient Instructions (Signed)
For your convenience the following is a list of community behavioral health providers:  If you find that you are having difficulty with a referral or finding the right practice for you, please let us know, we would be happy to help  Please note that for all practices, people must be in regular therapy in order to be able to be followed by a psychiatrist    [facility]  [phone] (central intake office)  [facility] has 3 clinics.  You can go either call the central intake line first, or you can do a walk-in visit.  There is a 2-hour window every day when you can do a walk-in visit.  Please call the central intake office to find out the most up to date schedule for the walk-in clinics.    -[facility], [address], [place], [phone] or [phone]  -[facility], [address], [place], [phone] or [phone]  -[facility], [address], [place], [phone] or [phone]    [facility]  [address] [place] [address], [place], [place] [address]  [phone] is the intake line for the [place] Office    [address], [place], [place]  [phone] is the intake line for the [place] office    [facility]  [address], [place]  [phone]    [facility]  For children and adolescents and their families  Will work with families in [place] and some parts of [place], have homebased services as well  [phone]    [facility]   Adult Outpatient   [address], [place], [place]  [phone]   If interested, please have your PCP make the referral    [facility]  [phone]  [facility] has outreach (homebased) services for people residing in: [place], [place], [place], [place], [place], [place] and [place]  [place] site, [phone].  Must do a referral to their intake line first at [phone]

## 2012-07-26 NOTE — Progress Notes (Signed)
Cheryl Dixon is a 57 year old female pt of Dr. Diona Foley who presents with anxiety      Anxiety  Saw Dr. Erlene Senters 2 weeks ago, given atarax, Wellbutrin for depression and anxiety  Before this was not leaving house for shopping, social events  Was breaking out in sweats, feeling nervous  Using atarax 50mg  three times daily, effective at first but not any longer  Has had anxiety and depression from many years, also history of substance abuse  Wellbutrin seemed to work the best for depression in the past  Was also on Klonopin in the past - not prescribed for 2 years  Had obtained from friends in the past, had taken some off and on, weaned herself of  Back and forth to counseling over last few years  No current treatment for anxiety depression - sees counselor at methadone clinic, more focused on substance abuse  Currently takes 58mg  methadone daily  Pt willing to get treatment, states she will pursue finding treaters tomorrow - wants to go to [facility]  Denies substance use except for occasional pot use  Expresses passive SI but denies specific plan  Currently has difficulty sleeping as well        Osteoarthritis  See orthopedics - [facility]  See every 3-6 months for knees  Get injections - helps  Started walking - helps with pain  Takes naproxen for pain - helps to take edge off pain, allows to walk  Would like refill today      Health maintenance  Pt reports feeling overwhelmed, wants to wait until next visit to address health maintenance      BP 118/73   Pulse 78   Temp(Src) 99.5 F (37.5 C) (Temporal)   Wt 200 lb (90.719 kg)   BMI 35.44 kg/m2   SpO2 97%   LMP 11/20/1991  Pain Score: 0 (0/10)             Review of Systems   Constitutional: Negative for fever.   Respiratory: Negative for shortness of breath.    Cardiovascular: Negative for chest pain.   Gastrointestinal: Negative for nausea, vomiting and abdominal pain.     Physical Exam   Constitutional: She appears well-developed and well-nourished. No distress.    HENT:   Head: Normocephalic and atraumatic.   Cardiovascular: Normal rate, regular rhythm and normal heart sounds.    No murmur heard.  Pulmonary/Chest: Effort normal and breath sounds normal. No respiratory distress. She has no wheezes. She has no rales.   Abdominal: Soft. Bowel sounds are normal. She exhibits no distension and no mass. There is no tenderness. There is no rebound and no guarding.   Psychiatric: She has a normal mood and affect.         ASSESSMENT/PLAN      (300.00) Anxiety state, unspecified  (primary encounter diagnosis)  311) Depression  Comment: Pt reports some improvement in depression on wellbutrin, will continue for now. Appears safe during interview, do not believe there is risk of self harm at this time. Pt needs specialty mental health services, agrees to see [facility] tomorrow. No specific treatment for anxiety, so will start sertraline today, as may be several weeks before she can see psychiatrist in the community. Will change atarax to 100mg  nightly for sleep.   Plan: hydrOXYzine (ATARAX) 50 MG tablet,  sertraline (ZOLOFT) 25 MG tablet           Sertraline 25mg  daily x 1 week, then 50mg  daily  Community mental health resources list given           Follow up with PCP in about 1 month      (719.46) Knee pain  Comment: refill done, to follow up with orthopedics  Plan: naproxen (NAPROSYN) 500 MG tablet            Follow up with orthopedic providers

## 2012-08-03 ENCOUNTER — Telehealth (HOSPITAL_BASED_OUTPATIENT_CLINIC_OR_DEPARTMENT_OTHER): Payer: Self-pay | Admitting: Physician Assistant

## 2012-08-03 NOTE — Progress Notes (Signed)
Called pt with questions about juror form and utility bil that she dropped off. Number listed in chart is not in service. Called number for stacy Seder that pt left with paperwork. No answer so left generic message requesting return call.

## 2012-08-04 ENCOUNTER — Telehealth (HOSPITAL_BASED_OUTPATIENT_CLINIC_OR_DEPARTMENT_OTHER): Payer: Self-pay

## 2012-08-04 NOTE — Progress Notes (Signed)
Tried to call patient, number not in service, will send letter.

## 2012-08-09 NOTE — Progress Notes (Addendum)
Again called number provided by pt to ask questions about forms that pt needed completed. No answer so left message requesting return call. Number listed in chart still not in service.

## 2012-08-11 ENCOUNTER — Other Ambulatory Visit (HOSPITAL_BASED_OUTPATIENT_CLINIC_OR_DEPARTMENT_OTHER): Payer: Self-pay | Admitting: Internal Medicine

## 2012-08-11 DIAGNOSIS — K219 Gastro-esophageal reflux disease without esophagitis: Secondary | ICD-10-CM

## 2012-08-11 DIAGNOSIS — F32A Depression, unspecified: Secondary | ICD-10-CM

## 2012-08-11 DIAGNOSIS — IMO0001 Reserved for inherently not codable concepts without codable children: Secondary | ICD-10-CM

## 2012-08-11 DIAGNOSIS — F329 Major depressive disorder, single episode, unspecified: Secondary | ICD-10-CM

## 2012-08-11 MED ORDER — LISINOPRIL 5 MG PO TABS
5.0000 mg | ORAL_TABLET | Freq: Every day | ORAL | Status: AC
Start: 2012-08-11 — End: 2012-11-09

## 2012-08-11 MED ORDER — OMEPRAZOLE 20 MG PO CPDR
20.0000 mg | DELAYED_RELEASE_CAPSULE | Freq: Every day | ORAL | Status: DC
Start: 2012-08-11 — End: 2013-01-20

## 2012-08-11 MED ORDER — BUPROPION HCL ER (XL) 150 MG PO TB24
150.0000 mg | ORAL_TABLET | Freq: Every morning | ORAL | Status: DC
Start: 2012-08-11 — End: 2012-09-24

## 2012-08-11 NOTE — Telephone Encounter (Signed)
Message copied by Aliene Altes on Wed Aug 11, 2012 11:04 AM  ------       Message from: AZEREDO, LEZETTE       Created: Wed Aug 11, 2012  9:52 AM       Regarding: Refill Request         [facility]              Person calling on behalf of patient: Patient (self)              May list multiple medications in this section              Medicine Name:        omeprazole (PRILOSEC) 20 MG capsule       buPROPion (WELLBUTRIN XL) 150 MG 24 hr tablet       lisinopril (PRINIVIL,ZESTRIL) 5 MG tablet              Dosage:               Frequency (how many pills, how many times a day):               Number of pills left: none              Documented patient preferred pharmacies:        [name] - [place], [place] - [address]       Phone: [phone] Fax: [phone]              [name] NUMBER: [phone]              Patient's language of care: English              Patient needs a [place] interpreter.                              ------

## 2012-08-11 NOTE — Progress Notes (Signed)
PT returned my call and left message. Attempted to call back, but again received no answer. Left message requesting return call

## 2012-08-11 NOTE — Telephone Encounter (Signed)
Message copied by Jesse Sans on Wed Aug 11, 2012 12:00 PM  ------       Message from: Sharyne Richters       Created: Wed Aug 11, 2012 11:42 AM       Regarding: rx         [facility]              Person calling on behalf of patient: Pharm       May list multiple medications in this section              Medicine Name: lisinopril (PRINIVIL,ZESTRIL) 5 MG tablet              Dosage:               Frequency (how many pills, how many times a day):               Number of pills left:               Documented patient preferred pharmacies:        [name] - [place], [place] - [address]       Phone: [phone] Fax: [phone]                ------

## 2012-08-11 NOTE — Progress Notes (Signed)
Person calling on behalf of patient: Patient (self)    Saidah Kempton is a 57 year old female       Medication(s) request:    - Omeprazole 20mg    - Bupropion (Wellbutrin XL) 150mg  24hr  Last office visit: 07/26/12  Last physical exam: 01/04/10      Confirmed with [facility], patient has refills on file for Lisinopril 5mg . Pharmacy is processing medication for patient to pick up later today.      Other Med Adult:  Most Recent BP Reading(s)  07/26/12 : 118/73        Cholesterol (mg/dl)   Date  Value    05/03/1094  264*   ----------    LDL DIRECT (mg/dl)   Date  Value    0/07/5407  101*   ----------    HDL (mg/dl)   Date  Value    11/26/1912  38    ----------    No results found for this basename: tg        No results found for this basename: TSHSC        No results found for this basename: TSH      HEMOGLOBIN A1C (%)   Date  Value    01/04/2010  5.8    ----------        INR (no units)   Date  Value    02/16/2007  1.0*    07/03/2006  < 1.0*   ----------           Documented patient preferred pharmacies:    [name] - [place], [place] - [address]  Phone: [phone] Fax: [phone]

## 2012-09-03 ENCOUNTER — Telehealth (HOSPITAL_BASED_OUTPATIENT_CLINIC_OR_DEPARTMENT_OTHER): Payer: Self-pay | Admitting: Internal Medicine

## 2012-09-06 ENCOUNTER — Telehealth (HOSPITAL_BASED_OUTPATIENT_CLINIC_OR_DEPARTMENT_OTHER): Payer: Self-pay

## 2012-09-06 NOTE — Telephone Encounter (Signed)
Message copied by Emelia Loron on Mon Sep 06, 2012  5:40 PM  ------       Message from: AZEREDO, LEZETTE       Created: Mon Sep 06, 2012  4:10 PM       Regarding: Letter for Utility and Jury Duty Excuse         Cheryl Dixon [id number], 57 year old, female, Telephone Information:       Home Phone      [phone]       Mobile          [phone]              Patient's Preferred Pharmacy:               [name] - [place], [place] - [facility]       Phone: [phone] Fax: [phone]              CONFIRMED TODAY: Lovett Sox NUMBER:  [phone]              Patient's language of care: English              Patient's PCP: Hildred Laser, MD              Person calling on behalf of patient: Patient (self)              Calls today requesting a letter for: Utilities and Jury Duty Excuse              Name of Utility: [employer]  Landscape architect)       Name on Utility Account: Cheryl Dixon       Utility Account Number: [id number]       Fax Number to send Letter: [phone]              Name of Utility: [employer]  (Electric)       Name on Utility Account: Cheryl Dixon       Utility Account Number: [id number]       Fax Number to send Letter: [phone]              Jury Duty: Cheryl Dixon       Badge ID Number: [id number]       Pin Number: [id number]       Phone Number: [phone]       Tuesday Sep 14, 2012 @ 8:00 AM                       ------

## 2012-09-06 NOTE — Progress Notes (Signed)
Patient requesting letters for jury duty and utilities (gas and electric), letters drafted and forwarded to provider and [place] to sign.

## 2012-09-07 NOTE — Progress Notes (Signed)
Letter signed and placed in out-box to be faxed.

## 2012-09-07 NOTE — Progress Notes (Signed)
Giving utility letters by Alvis Lemmings to fax. Called and informed patient that they were both faxed but we don't have a fax number for her jury letter and if she would like all three letters can be picked up for herself. Patient agreed. Letters will be on the first floor.

## 2012-09-08 ENCOUNTER — Ambulatory Visit (HOSPITAL_BASED_OUTPATIENT_CLINIC_OR_DEPARTMENT_OTHER): Payer: MEDICARE | Admitting: Physician Assistant

## 2012-09-24 ENCOUNTER — Other Ambulatory Visit (HOSPITAL_BASED_OUTPATIENT_CLINIC_OR_DEPARTMENT_OTHER): Payer: Self-pay | Admitting: Internal Medicine

## 2012-09-24 NOTE — Progress Notes (Signed)
Person calling on behalf of patient: Pharmacy    Cheryl Dixon is a 57 year old female       - medication(s) requested: Wellbutrin XL 150 MG  - last office visit: 07/26/2012  - last physical exam: 01/04/2010      Other Med Adult:  Most Recent BP Reading(s)  07/26/12 : 118/73      Cholesterol (mg/dl)   Date  Value    11/29/6960  264*   ----------  LDL DIRECT (mg/dl)   Date  Value    01/01/2840  101*   ----------  HDL (mg/dl)   Date  Value    06/28/4399  38    ----------  No results found for this basename: tg      No results found for this basename: TSHSC      No results found for this basename: TSH    HEMOGLOBIN A1C (%)   Date  Value    01/04/2010  5.8    ----------      INR (no units)   Date  Value    02/16/2007  1.0*    07/03/2006  < 1.0*   ----------           Documented patient preferred pharmacies:    [name] - [place], [place] - [address]  Phone: [phone] Fax: [phone]

## 2012-09-28 NOTE — Progress Notes (Signed)
Prescription sent electronically to preferred pharmacy.

## 2012-10-13 ENCOUNTER — Telehealth (HOSPITAL_BASED_OUTPATIENT_CLINIC_OR_DEPARTMENT_OTHER): Payer: Self-pay

## 2012-10-13 NOTE — Progress Notes (Signed)
Tried to call patient, first number not in service, second number, name on voicemail was Kennyth Arnold, left message for return call from patient.

## 2012-10-20 NOTE — Progress Notes (Signed)
Tried again to call patient with same result, will send letter.

## 2012-10-21 ENCOUNTER — Other Ambulatory Visit (HOSPITAL_BASED_OUTPATIENT_CLINIC_OR_DEPARTMENT_OTHER): Payer: Self-pay | Admitting: Internal Medicine

## 2012-10-21 NOTE — Telephone Encounter (Signed)
Called cell# lm to call office-patient needs colonoscopy & mammo

## 2012-12-01 ENCOUNTER — Telehealth (HOSPITAL_BASED_OUTPATIENT_CLINIC_OR_DEPARTMENT_OTHER): Payer: Self-pay

## 2012-12-01 NOTE — Progress Notes (Signed)
Tried to call patient, home number no longer in service, tried number listed as mobile, a female answered, stated patient not home, left message for return call tomorrow.

## 2012-12-02 NOTE — Progress Notes (Addendum)
Tried to return call to patient, mobile number listed belonged to her sister, she provided me with correct number for patient. Called patient, she reports she does not think she is doing well, reports she does not like to leave the house, feels anxious when she does go out. Was taking bupropion until 3 days ago when she ran out. Thinks it may have helped a little but not much. Patient reports she feels safe but knows she needs to come in, appt scheduled tomorrow with Tobi Bastos.

## 2012-12-02 NOTE — Progress Notes (Signed)
Cheryl Dixon -- unclear if she was compliant with recent prescriptions of wellbutrin and sertraline in March.  Consider retrial of one of those meds if she did not give it an adequate trial, or buspirone.  Needs to follow up with me as she has not seen me for some time.  Avoid benzodiazepines as she is on methadone.

## 2012-12-02 NOTE — Telephone Encounter (Signed)
Message copied by Emelia Loron on Thu Dec 02, 2012 11:14 AM  ------       Message from: Kennyth Lose       Created: Thu Dec 02, 2012  9:39 AM       Regarding: returning phonecall         Cheryl Dixon [id number], 57 year old, female, Telephone Information:       Home Phone      [phone]              [name] NUMBER: [phone]              Patient's language of care: English              Patient does not need an interpreter.              Patient's PCP: Hildred Laser, MD              Person calling on behalf of patient: Patient (self)              Calls today returning phonecall.                       ------

## 2012-12-03 ENCOUNTER — Ambulatory Visit (HOSPITAL_BASED_OUTPATIENT_CLINIC_OR_DEPARTMENT_OTHER): Payer: MEDICARE | Admitting: Physician Assistant

## 2012-12-21 ENCOUNTER — Other Ambulatory Visit: Payer: Self-pay

## 2012-12-21 DIAGNOSIS — Z1231 Encounter for screening mammogram for malignant neoplasm of breast: Secondary | ICD-10-CM

## 2013-01-19 ENCOUNTER — Ambulatory Visit
Admission: RE | Admit: 2013-01-19 | Discharge: 2013-01-19 | Disposition: A | Discharge status: Home / Self Care | Payer: 59 | Source: Ambulatory Visit

## 2013-01-19 DIAGNOSIS — Z1231 Encounter for screening mammogram for malignant neoplasm of breast: Secondary | ICD-10-CM

## 2013-01-20 ENCOUNTER — Other Ambulatory Visit (HOSPITAL_BASED_OUTPATIENT_CLINIC_OR_DEPARTMENT_OTHER): Payer: Self-pay | Admitting: Internal Medicine

## 2013-01-20 ENCOUNTER — Other Ambulatory Visit (HOSPITAL_BASED_OUTPATIENT_CLINIC_OR_DEPARTMENT_OTHER): Payer: Self-pay

## 2013-01-20 DIAGNOSIS — K219 Gastro-esophageal reflux disease without esophagitis: Secondary | ICD-10-CM

## 2013-01-20 MED ORDER — OMEPRAZOLE 20 MG PO CPDR
20.0000 mg | DELAYED_RELEASE_CAPSULE | Freq: Every day | ORAL | Status: DC
Start: 2013-01-20 — End: 2013-02-15

## 2013-01-20 NOTE — Telephone Encounter (Signed)
Message copied by Larinda Buttery on Thu Jan 20, 2013 11:49 AM  ------       Message from: Kennyth Lose       Created: Thu Jan 20, 2013 11:38 AM       Regarding: refill         [facility]              Person calling on behalf of patient: Patient (self)              May list multiple medications in this section              Medicine Name: OMeprazole              Documented patient preferred pharmacies:        [name] - [place], [place] - [place], [place] - [address]       Phone: [phone] Fax: [phone]                       ------

## 2013-01-20 NOTE — Telephone Encounter (Signed)
Lm for pt,  needs mammo and colonoscopy   Perel Hauschild Zillah, 01/20/2013, 2:24 PM

## 2013-01-20 NOTE — Progress Notes (Signed)
PER Patient (self), Cheryl Dixon is a 57 year old female has requested a refill of Omeprazole 20mg .      Last Office Visit: 07-26-2012  Last Physical Exam: 01-04-2010      Other Med Adult:  Most Recent BP Reading(s)  07/26/12 : 118/73      Cholesterol (mg/dl)   Date  Value    05/03/1094  264*   ----------  LDL DIRECT (mg/dl)   Date  Value    0/07/5407  101*   ----------  HDL (mg/dl)   Date  Value    11/26/1912  38    ----------  No results found for this basename: tg      No results found for this basename: TSHSC      No results found for this basename: TSH    HEMOGLOBIN A1C (%)   Date  Value    01/04/2010  5.8    ----------      INR (no units)   Date  Value    02/16/2007  1.0*    07/03/2006  < 1.0*   ----------       Documented patient preferred pharmacies:    [name] - [place], [place] - [place], [place] - [address]  Phone: [phone] Fax: [phone]

## 2013-02-14 ENCOUNTER — Encounter (HOSPITAL_BASED_OUTPATIENT_CLINIC_OR_DEPARTMENT_OTHER): Payer: Self-pay | Admitting: Physician Assistant

## 2013-02-14 ENCOUNTER — Ambulatory Visit (HOSPITAL_BASED_OUTPATIENT_CLINIC_OR_DEPARTMENT_OTHER): Payer: MEDICARE | Admitting: Physician Assistant

## 2013-02-14 ENCOUNTER — Telehealth (HOSPITAL_BASED_OUTPATIENT_CLINIC_OR_DEPARTMENT_OTHER): Payer: Self-pay | Admitting: Ambulatory Care

## 2013-02-14 VITALS — HR 65 | Temp 97.9°F | Wt 200.0 lb

## 2013-02-14 DIAGNOSIS — Z1239 Encounter for other screening for malignant neoplasm of breast: Secondary | ICD-10-CM

## 2013-02-14 DIAGNOSIS — R1013 Epigastric pain: Secondary | ICD-10-CM

## 2013-02-14 DIAGNOSIS — F32A Depression, unspecified: Secondary | ICD-10-CM

## 2013-02-14 DIAGNOSIS — F329 Major depressive disorder, single episode, unspecified: Principal | ICD-10-CM

## 2013-02-14 NOTE — Progress Notes (Addendum)
Called and spoke with pt who said she is under a lot of stress lately with her child who was in prison and having to take care of her grandchildren  boyfriend giving her HA  Medical issues    Pt is requesting to see a psychiatrist   Declined to see a therapist at this time    She has an appt scheduled with Dr Renaldo Reel for December but pt will like to see someone sooner    Message sent to Consuella Lose,  Does [facility] have anything sooner    Please advise

## 2013-02-14 NOTE — Telephone Encounter (Signed)
Message copied by Milus Mallick on Mon Feb 14, 2013  3:51 PM  ------       Message from: Kennyth Lose       Created: Mon Feb 14, 2013 12:57 PM       Regarding: mental health referral         Cheryl Dixon [id number], 57 year old, female, Telephone Information:       Mobile       [phone]                       [name] NUMBER: [phone]       Patient's language of care: English              Patient does not need an interpreter.              Patient's PCP: Hildred Laser, MD              Person calling on behalf of patient: Patient (self)              Calls today to discuss mental health referral.                       ------

## 2013-02-14 NOTE — Progress Notes (Signed)
I have left a message stating that there is an earlier apt 03-31-13 patient apt now is 12-15 with Dr Renaldo Reel?  New apt with [facility] if still available when patient returns call

## 2013-02-14 NOTE — Progress Notes (Signed)
Cheryl Dixon is a 57 year old female patient of Hildred Laser, MD presenting with:         Epigastric pain   Chronic   Had H. Pylori infection about a year ago and was treated for that with triple therapy. Patient reports that pain  got better after completing treatment, but pain came back    she describes as an on/off  burning pain  She may go few days pain free, then come back   She admits to acid reflux at night and morning  She is On Omeprazole 40 mg daily  Not eating any spicy, fatty, citrus food   Denies blood in stool, and very dark stool     Depression  She is currently on Wellbutrin 150 mg   She does think this is helpful   She has been going through a lot lately  Mother in law recently passed away, buried her 4 days ago  Daughter was recently incarcerated, but she is now free   Lost mother 4 years ago  Lost brother 5 years ago  "Now mother in law"  "I realize it's part of life, but I can't do it anymore"   Patient reports she wants to see a psychiatrist   She does not think medications are working   She reports that she was seeing a therapist at the [facility], but she was never able to see a psychiatrist.   She sometimes thinks about hurting self, but she will never harm self or commit suicide.          Review of Systems   Gastrointestinal: Positive for heartburn and abdominal pain. Negative for blood in stool.   Psychiatric/Behavioral: Positive for depression. Negative for suicidal ideas.      02/14/13  1144   Pulse: 65   Temp: 97.9 F (36.6 C)   TempSrc: Temporal   Weight: 200 lb (90.719 kg)   SpO2: 99%       Physical Exam   Constitutional: She is oriented to person, place, and time. No distress.   Patient is tearful   Abdominal: Soft. She exhibits no distension. There is no tenderness. There is no rebound and no guarding.   Neurological: She is oriented to person, place, and time.   Psychiatric: Thought content normal. Her speech is not rapid and/or pressured. She exhibits a depressed mood. She expresses no  homicidal and no suicidal ideation. She expresses no suicidal plans and no homicidal plans.   phq9: 23      ASSESSMENT/PLAN:    (789.06) Epigastric abdominal pain  Comment: patient was treated for H. Pylori last year. However, she continues to have intermittent epigastric pain and symptom consistent with GERD. There was no epigastric tenderness on exam.   Plan:  Switching from Prilosec to Nexium. Refer to GI for further evaluation. I advised patient to avoid NSAIDS, fatty/spicy/citrus food.   REFERRAL TO GASTROENTEROLOGY ( INT),         esomeprazole (NEXIUM) 40 MG capsule           (311) Depression  Comment: phq9 23. Wellbutrin 150 mg not adequately treating her depression. Patient also with may social factors contributing to her depression. I do not think patient is a danger to self or others.   Plan: Increase Wellbutrin to 300 MG daily. I think patient could greatly benefit from psychotherapy, but patient is only willing to see psychiatry. I am referring her for evaluation with psychiatry at our clinic. I will also discuss this  with patient PCP hoping that he can convince her  to go for therapy. A nurse will also follow up on patient.    REFERRAL TO ADULT PSYCHIATRY ( INT), buPROPion         (WELLBUTRIN XL) 300 MG 24 hr tablet      (V76.10) Breast cancer screening  Comment:   Plan:  MAMMOGRAPHY SCREENING BILATERAL W CAD            I have spent 25 minutes in face to face time with this patient/patient proxy of which > 50% was in counseling or coordination of care regarding above issues/Dx.                1. The patient indicates understanding of these issues and agrees with the plan.  2.  The patient is given an After Visit Summary sheet that lists all of their medications with directions, their allergies, orders placed during this encounter, immunization dates, and follow- up instructions.  3. I reviewed the patient's medical information and medical history   4.  I reconciled the patient's medication list and  prepared and supplied needed refills.  5.  I have reviewed the past medical, family, and social history sections including the medications and allergies listed in the above medical record    Electronically signed by: Francee Gentile, PA-C, 02/15/2013, 2:08 PM    This note is electronically signed in the electronic medical record.

## 2013-02-15 MED ORDER — BUPROPION HCL ER (XL) 300 MG PO TB24
300.0000 mg | ORAL_TABLET | Freq: Every morning | ORAL | Status: DC
Start: 2013-02-15 — End: 2013-05-27

## 2013-02-15 MED ORDER — ESOMEPRAZOLE MAGNESIUM 40 MG PO CPDR
40.0000 mg | DELAYED_RELEASE_CAPSULE | Freq: Every morning | ORAL | Status: DC
Start: 2013-02-15 — End: 2013-05-12

## 2013-02-17 ENCOUNTER — Encounter (HOSPITAL_BASED_OUTPATIENT_CLINIC_OR_DEPARTMENT_OTHER): Payer: Self-pay | Admitting: Emergency Medicine

## 2013-02-17 ENCOUNTER — Ambulatory Visit (HOSPITAL_BASED_OUTPATIENT_CLINIC_OR_DEPARTMENT_OTHER): Payer: MEDICARE | Admitting: Emergency Medicine

## 2013-02-17 ENCOUNTER — Encounter (HOSPITAL_BASED_OUTPATIENT_CLINIC_OR_DEPARTMENT_OTHER): Payer: Self-pay | Admitting: Internal Medicine

## 2013-02-17 ENCOUNTER — Other Ambulatory Visit (HOSPITAL_COMMUNITY): Payer: Self-pay | Admitting: *Deleted

## 2013-02-17 ENCOUNTER — Encounter (HOSPITAL_COMMUNITY)
Admission: RE | Admit: 2013-02-17 | Discharge: 2013-02-17 | Disposition: A | Discharge status: Home / Self Care | Payer: 59 | Source: Ambulatory Visit | Attending: Rheumatology | Admitting: Rheumatology

## 2013-02-17 VITALS — BP 120/81 | HR 72 | Temp 98.2°F | Wt 201.0 lb

## 2013-02-17 DIAGNOSIS — K469 Unspecified abdominal hernia without obstruction or gangrene: Secondary | ICD-10-CM

## 2013-02-17 DIAGNOSIS — F32A Depression, unspecified: Secondary | ICD-10-CM

## 2013-02-17 DIAGNOSIS — F329 Major depressive disorder, single episode, unspecified: Secondary | ICD-10-CM

## 2013-02-17 DIAGNOSIS — R1013 Epigastric pain: Secondary | ICD-10-CM

## 2013-02-17 DIAGNOSIS — I8390 Asymptomatic varicose veins of unspecified lower extremity: Secondary | ICD-10-CM

## 2013-02-17 DIAGNOSIS — IMO0001 Reserved for inherently not codable concepts without codable children: Secondary | ICD-10-CM

## 2013-02-17 DIAGNOSIS — M069 Rheumatoid arthritis, unspecified: Secondary | ICD-10-CM | POA: Insufficient documentation

## 2013-02-17 MED ORDER — SODIUM CHLORIDE 0.9 % IV SOLN
750.0000 mg | Freq: Once | INTRAVENOUS | Status: AC
  Administered 2013-02-17: 750 mg via INTRAVENOUS
  Filled 2013-02-17: qty 30

## 2013-02-17 MED ORDER — SODIUM CHLORIDE 0.9 % IV SOLN
Freq: Once | INTRAVENOUS | Status: AC
  Administered 2013-02-17: 13:00:00 via INTRAVENOUS

## 2013-02-17 NOTE — Patient Instructions (Addendum)
To improve your health:  Reduce/eliminate white sugar, white flour, white rice and white potato.  Replace with brown rice; whole wheat pasta, and other whole grains.    STop taking lisinopril - and return in one week to see nurse for bp check.

## 2013-02-17 NOTE — Progress Notes (Signed)
Cheryl Dixon is a 57 year old female  Stomach pain    Hx of h pylori - treated 2 yrs ago - symptoms got better with treatment.  Now symptoms have returned.  Has nausea - feels like a knot in mid chest - like the food didn't go all the way down.  If I drink water too fast - it comes right back up.  Stops eating after 6 pm - goes to bed at 10 pm.  Has elevated the HOB.  She is disappointed to have to wait until December - but is not willing to consider different location for evaluation.    Blood pressure - Lisinopril causes headaches - is only taking when she feels her BP is high.  BP today 120/81  Asked her to stop lisinopril and return for BP check -     Sweats - constantly - ongoing for more than 2 yrs -   S/p hysterectomy age 63 - had estrogen - and she stopped it when the controversy came out.  Does get facial flushing - sometimes with walking short distances.  I am reluctant to prescribe venlafaxine - as she has allergy to other SSRI's.  She has been told this may relate to methadone - will refer to Dr Diona Foley.    Hernia surgery at [facility] - with mesh.  On right side of abdomen.  Gets pain sometimes.  Would like this checked.    Gaining weight - discussed that this makes all problems worse.  Discussed reducing carbohydrates - is going to gym regularly.  Has taken of 85 lbs (8 yrs ago) - when mother died 3 yrs ago -her walking partner - put back 30 lbs.  Had been exercising.    Depression - mother in law died - family member involved in murder.  Has counselor at Methadone clinic - but I can't tell them  Has appointment with Dr Renaldo Reel for December (has Medicare insurance).    Varicose veins - offered/declined vascular referral.         Review of Systems   Constitutional: Positive for diaphoresis.   Gastrointestinal: Negative for nausea, vomiting and abdominal pain.     Physical Exam   Nursing note and vitals reviewed.  Constitutional: She appears well-developed and well-nourished. No distress.   Abdominal:  Soft.   Large Reducible hernia in RLQ    Neurological: She is alert.   Skin: Skin is warm and dry.   Psychiatric: She has a normal mood and affect. Her behavior is normal.     ASSESSMENT/PLAN:  (789.06) Abdominal pain, epigastric  (primary encounter diagnosis)  Comment: will recheck for h pylori - reviewed strategies for reducing symptoms - which she is doing - advised to work on weight loss by decreasing simple carbohydrates.  Plan: HELICOBACTER PYLORI STOOL AG (INHOUSE)          (553.9) Abdominal hernia  Comment: reducible  Plan: REFERRAL TO GENERAL SURGERY ( INT)          (796.2) Elevated BP  Comment: will stop lisinopril and have repeat BP check -   Plan: if she needs medication would start amlodipine 2.5 mg daily.    (311) Depression  Comment: has psychopharm when dr Renaldo Reel returns.  Plan: dose of wellbutrin increased.    (454.9) Varicose vein  Comment: declines referral    Encouraged follow up with PCP

## 2013-03-01 ENCOUNTER — Ambulatory Visit (HOSPITAL_BASED_OUTPATIENT_CLINIC_OR_DEPARTMENT_OTHER): Payer: MEDICARE | Admitting: Surgery

## 2013-03-02 ENCOUNTER — Other Ambulatory Visit (HOSPITAL_BASED_OUTPATIENT_CLINIC_OR_DEPARTMENT_OTHER): Payer: Self-pay

## 2013-03-02 NOTE — Telephone Encounter (Signed)
message to call and schedule Colonoscopy/stool cards

## 2013-03-03 ENCOUNTER — Ambulatory Visit (HOSPITAL_BASED_OUTPATIENT_CLINIC_OR_DEPARTMENT_OTHER): Payer: MEDICARE | Admitting: Surgery

## 2013-03-10 ENCOUNTER — Emergency Department (HOSPITAL_BASED_OUTPATIENT_CLINIC_OR_DEPARTMENT_OTHER)
Admission: RE | Admit: 2013-03-10 | Disposition: A | Discharge status: Home / Self Care | Payer: Self-pay | Source: Emergency Department | Attending: Physician Assistant | Admitting: Physician Assistant

## 2013-03-10 ENCOUNTER — Encounter (HOSPITAL_BASED_OUTPATIENT_CLINIC_OR_DEPARTMENT_OTHER): Payer: Self-pay

## 2013-03-10 ENCOUNTER — Ambulatory Visit (HOSPITAL_BASED_OUTPATIENT_CLINIC_OR_DEPARTMENT_OTHER): Payer: MEDICARE | Admitting: Physician Assistant

## 2013-03-10 ENCOUNTER — Telehealth (HOSPITAL_BASED_OUTPATIENT_CLINIC_OR_DEPARTMENT_OTHER): Payer: Self-pay | Admitting: Ambulatory Care

## 2013-03-10 LAB — XR FOOT LEFT MINIMUM 3 VIEWS

## 2013-03-10 MED ORDER — IBUPROFEN 600 MG PO TABS
600.00 mg | ORAL_TABLET | Freq: Once | ORAL | Status: AC
Start: 2013-03-10 — End: 2013-03-10
  Administered 2013-03-10: 600 mg via ORAL

## 2013-03-10 MED ORDER — IBUPROFEN 600 MG PO TABS
600.0000 mg | ORAL_TABLET | Freq: Three times a day (TID) | ORAL | Status: AC | PRN
Start: 2013-03-10 — End: 2013-03-20

## 2013-03-10 MED ORDER — IBUPROFEN 600 MG PO TABS
ORAL_TABLET | ORAL | Status: AC
Start: 2013-03-10 — End: 2013-03-10
  Administered 2013-03-10: 600 mg via ORAL
  Filled 2013-03-10: qty 1

## 2013-03-10 NOTE — ED Notes (Signed)
PA at bedside.

## 2013-03-10 NOTE — ED Notes (Signed)
Pt. Returns from Xray.

## 2013-03-10 NOTE — Discharge Instructions (Signed)
Your X-ray was NORMAL and did not show any evidence of fracture.  Please use hard-soled shoe as needed for ambulation over the next 2-3 days.  Ice.  Elevate foot when at rest.  Tylenol or motrin as needed for pain.  Call you primary care doctor for a follow-up appointment within 1 week.
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
## 2013-03-10 NOTE — Progress Notes (Signed)
Depression Outreach call placed to pt  Left message on VM to call [facility] [phone]

## 2013-03-10 NOTE — ED Notes (Signed)
Pt. Demonstrates appropriate walking skills with hard boot.

## 2013-03-10 NOTE — ED Notes (Signed)
Pt. To Xray via w/c.

## 2013-03-10 NOTE — ED Notes (Signed)
Patient Disposition    Patient education for diagnosis, medications, activity, diet and follow-up.  Patient left ED 9:59 AM.  Patient rep received written instructions.  Interpreter to provide instructions: No    Discharged to: Discharged to home
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
## 2013-03-11 ENCOUNTER — Encounter (HOSPITAL_BASED_OUTPATIENT_CLINIC_OR_DEPARTMENT_OTHER): Payer: Self-pay | Admitting: Internal Medicine

## 2013-03-11 ENCOUNTER — Ambulatory Visit (HOSPITAL_BASED_OUTPATIENT_CLINIC_OR_DEPARTMENT_OTHER): Payer: MEDICARE | Admitting: Internal Medicine

## 2013-03-11 VITALS — BP 146/94 | HR 72 | Temp 99.2°F | Wt 199.0 lb

## 2013-03-11 DIAGNOSIS — Z5189 Encounter for other specified aftercare: Secondary | ICD-10-CM

## 2013-03-11 DIAGNOSIS — S99921D Unspecified injury of right foot, subsequent encounter: Secondary | ICD-10-CM

## 2013-03-11 DIAGNOSIS — K5900 Constipation, unspecified: Secondary | ICD-10-CM

## 2013-03-11 DIAGNOSIS — F39 Unspecified mood [affective] disorder: Secondary | ICD-10-CM

## 2013-03-11 MED ORDER — LIDOCAINE 5 % EX OINT
TOPICAL_OINTMENT | CUTANEOUS | Status: AC | PRN
Start: 2013-03-11 — End: 2013-05-10

## 2013-03-11 MED ORDER — POLYETHYLENE GLYCOL 3350 17 GM/SCOOP PO POWD
17.0000 g | Freq: Every day | ORAL | Status: DC
Start: 2013-03-11 — End: 2013-07-04

## 2013-03-11 MED ORDER — FLUOXETINE HCL 10 MG PO CAPS
ORAL_CAPSULE | ORAL | Status: DC
Start: 2013-03-11 — End: 2013-05-27
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
## 2013-03-12 ENCOUNTER — Encounter (HOSPITAL_BASED_OUTPATIENT_CLINIC_OR_DEPARTMENT_OTHER): Payer: Self-pay | Admitting: Internal Medicine

## 2013-03-14 ENCOUNTER — Ambulatory Visit: Payer: Self-pay | Admitting: Physician Assistant

## 2013-03-29 ENCOUNTER — Telehealth (HOSPITAL_BASED_OUTPATIENT_CLINIC_OR_DEPARTMENT_OTHER): Payer: Self-pay | Admitting: Ambulatory Care

## 2013-03-29 NOTE — Progress Notes (Signed)
Called and spoke with pt who was started on Prozac in addition to the Wellbutrin  She said she has not picked up the Prozac and she want to see Dr Diona Foley    Pt said she was told to call in the am to make an appt with PCP for same day and she said she will discuss the med issue with PCP at that time    Message sent to Dr Diona Foley

## 2013-04-06 ENCOUNTER — Ambulatory Visit (HOSPITAL_BASED_OUTPATIENT_CLINIC_OR_DEPARTMENT_OTHER): Payer: MEDICARE | Admitting: Gastroenterology

## 2013-04-07 ENCOUNTER — Telehealth (HOSPITAL_BASED_OUTPATIENT_CLINIC_OR_DEPARTMENT_OTHER): Payer: Self-pay | Admitting: Ambulatory Care

## 2013-04-07 NOTE — Progress Notes (Addendum)
Pt requesting a letter giving permission to excuse her from [place] [employer] 09-14-12    Pt said she received a notice that she was in violation and did not show because the letter was supposed to have been faxed and they never received it    Letter printed and will await signature by PCP

## 2013-04-07 NOTE — Telephone Encounter (Signed)
Message copied by Milus Mallick on Thu Apr 07, 2013  4:08 PM  ------       Message from: Kennyth Lose       Created: Thu Apr 07, 2013  3:52 PM       Regarding: letter for jury duty         Cheryl Dixon [id number], 57 year old, female, Telephone Information:       Home Phone      [phone]                     [name] NUMBER: [phone]              Patient's language of care: English              Patient does not need an interpreter.              Patient's PCP: Hildred Laser, MD              Person calling on behalf of patient: Patient (self)              Calls today for jury duty letter.       Fraser Din # [id number]       Date to appear 09/14/12       Fax [phone]                              ------

## 2013-04-08 NOTE — Progress Notes (Signed)
Letter printed and placed in out-box.  Please see if pt wants to pick up or provide a fax number.  (We could fax this but we do not have a fax number, which the patient was told when we first wrote the letter in May).

## 2013-04-08 NOTE — Progress Notes (Signed)
Called pt,  She said she wants me to fax the letter, told her i would fax it and leave a copy for her at front desk  [place], 04/08/2013, 12:58 PM      Calls today for jury duty letter.   Fraser Din # [id number]   Date to appear 09/14/12   Fax [phone]

## 2013-04-11 ENCOUNTER — Ambulatory Visit (HOSPITAL_BASED_OUTPATIENT_CLINIC_OR_DEPARTMENT_OTHER): Payer: MEDICARE | Admitting: Psychosomatic Medicine

## 2013-04-15 ENCOUNTER — Telehealth (HOSPITAL_BASED_OUTPATIENT_CLINIC_OR_DEPARTMENT_OTHER): Payer: Self-pay | Admitting: Internal Medicine

## 2013-04-15 NOTE — Progress Notes (Signed)
Jury letter signed and placed in out-box to be faxed.

## 2013-04-15 NOTE — Progress Notes (Signed)
Faxed  Cheryl Dixon [place], 04/15/2013, 3:51 PM

## 2013-04-26 ENCOUNTER — Other Ambulatory Visit (HOSPITAL_BASED_OUTPATIENT_CLINIC_OR_DEPARTMENT_OTHER): Payer: Self-pay

## 2013-04-26 NOTE — Telephone Encounter (Signed)
I spoke with [name] and she is not interested at this time to take a Mammo

## 2013-05-02 ENCOUNTER — Ambulatory Visit (HOSPITAL_BASED_OUTPATIENT_CLINIC_OR_DEPARTMENT_OTHER): Payer: MEDICARE | Admitting: Internal Medicine

## 2013-05-02 ENCOUNTER — Ambulatory Visit (HOSPITAL_BASED_OUTPATIENT_CLINIC_OR_DEPARTMENT_OTHER): Payer: MEDICARE | Admitting: Psychosomatic Medicine

## 2013-05-02 DIAGNOSIS — F329 Major depressive disorder, single episode, unspecified: Secondary | ICD-10-CM

## 2013-05-02 DIAGNOSIS — F32A Depression, unspecified: Secondary | ICD-10-CM

## 2013-05-02 DIAGNOSIS — F419 Anxiety disorder, unspecified: Secondary | ICD-10-CM

## 2013-05-02 DIAGNOSIS — F1120 Opioid dependence, uncomplicated: Secondary | ICD-10-CM

## 2013-05-02 NOTE — Patient Instructions (Addendum)
-  restart fluoxetine 10mg  daily for 2 weeks.  -then increase to 20mg  daily.   -please call Dr Silvana Newness office for refills  -you do not have bipolar disorder  -check thyroid level today  -think about reducing marijuana use-this can worsen your anxiety symptoms
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
## 2013-05-10 ENCOUNTER — Telehealth (HOSPITAL_BASED_OUTPATIENT_CLINIC_OR_DEPARTMENT_OTHER): Payer: Self-pay | Admitting: Ambulatory Care

## 2013-05-10 NOTE — Progress Notes (Signed)
Message left on v/m for pt to call back.

## 2013-05-10 NOTE — Telephone Encounter (Signed)
Message copied by Jane Canary on Tue May 10, 2013  9:41 AM  ------       Message from: Beatrix Shipper       Created: Tue May 10, 2013  9:28 AM       Regarding: is having stomach pains, wants to do bloodwork . to speak to a nurse.       Contact: [place] [id number], 58 year old, female, Telephone Information:       Home Phone      [phone]       Work Phone      Not on file.       Mobile          [phone]       Home Phone      Not on file.                     Patient's Preferred Pharmacy:               [facility], [place], [place]       Phone: [phone] Fax: [phone]                     CONFIRMED TODAY: [name] NUMBER: [phone]                     Patient's language of care: English              Patient does not need an interpreter.              Patient's PCP: [name], MD              Person calling on behalf of patient: Patient (self)              Calls today : pt calling states her stomach is hurting , wants to come in for blood work , believes something is wrong . Was diagnosed w/ hpylori . Wants to speak to somebody states she doesn't know what to do .                        ------

## 2013-05-10 NOTE — Progress Notes (Signed)
Called pt  Pt states she has been having intermittent stomach discomfort for many months  Pt states worse at night  Pt concerned she has H pylori again  Declined appt tomorrow  Scheduled for Thursday  No pain at this time  Pt states usually occurs at night  States has bed on blocks, stopped smoking does not eat late  Pt instructed to go to ED if increase abd pain, N/v, or concerning S/s  Pt verbalized understanding and in agreement with plan

## 2013-05-12 ENCOUNTER — Ambulatory Visit (HOSPITAL_BASED_OUTPATIENT_CLINIC_OR_DEPARTMENT_OTHER): Payer: MEDICARE | Admitting: Physician Assistant

## 2013-05-12 ENCOUNTER — Encounter (HOSPITAL_BASED_OUTPATIENT_CLINIC_OR_DEPARTMENT_OTHER): Payer: Self-pay | Admitting: Physician Assistant

## 2013-05-12 ENCOUNTER — Telehealth (HOSPITAL_BASED_OUTPATIENT_CLINIC_OR_DEPARTMENT_OTHER): Payer: Self-pay | Admitting: Ambulatory Care

## 2013-05-12 VITALS — BP 134/88 | HR 80 | Temp 98.3°F | Wt 200.0 lb

## 2013-05-12 DIAGNOSIS — R109 Unspecified abdominal pain: Secondary | ICD-10-CM

## 2013-05-12 DIAGNOSIS — Z760 Encounter for issue of repeat prescription: Secondary | ICD-10-CM

## 2013-05-12 DIAGNOSIS — Z1211 Encounter for screening for malignant neoplasm of colon: Secondary | ICD-10-CM

## 2013-05-12 DIAGNOSIS — Z1239 Encounter for other screening for malignant neoplasm of breast: Secondary | ICD-10-CM

## 2013-05-12 DIAGNOSIS — Z23 Encounter for immunization: Secondary | ICD-10-CM

## 2013-05-12 MED ORDER — RANITIDINE HCL 150 MG PO TABS
150.0000 mg | ORAL_TABLET | Freq: Two times a day (BID) | ORAL | Status: DC | PRN
Start: 2013-05-12 — End: 2013-07-04

## 2013-05-12 MED ORDER — ACETAMINOPHEN 325 MG PO TABS
650.0000 mg | ORAL_TABLET | Freq: Four times a day (QID) | ORAL | Status: DC | PRN
Start: 2013-05-12 — End: 2013-05-13

## 2013-05-12 MED ORDER — ACETAMINOPHEN 167 MG/5ML PO LIQD
650.0000 mg | Freq: Four times a day (QID) | ORAL | Status: DC | PRN
Start: 2013-05-12 — End: 2013-05-12

## 2013-05-12 MED ORDER — ESOMEPRAZOLE MAGNESIUM 40 MG PO CPDR
40.0000 mg | DELAYED_RELEASE_CAPSULE | Freq: Every morning | ORAL | Status: DC
Start: 2013-05-12 — End: 2013-07-04

## 2013-05-12 NOTE — Progress Notes (Signed)
Called and spoke with pharmacist at [facility] and he said pt is ordered for liquid tylenol and her insurance will not cover the cost   He is asking if pt can have the tablets ordered instead.    Message sent to Dr Lisa Roca, please advise

## 2013-05-12 NOTE — Progress Notes (Signed)
Influenza Vaccine Procedure  May 12, 2013    1. Has the patient received the information for the influenza vaccine? Yes.    2. Does the patient have any of the following contraindications?  Allergy to eggs? No  Allergic reaction to previous influenza vaccines? No  Any other problems to previous influenza vaccines? No  Paralyzed by Guillain-Barre syndrome?  No  Currently pregnant? No  Current moderate or severe illness? No  Allergy to contact lens solution? No    3. The vaccine has been administered in the usual fashion and the patient/guardian was instructed to wait 20 minutes before leaving the building in the event of an allergic reaction:     Immunization information and current VIS for flu vaccine(s) reviewed; verbal consent given by patient/guardian.

## 2013-05-12 NOTE — Telephone Encounter (Signed)
Message copied by [name] on Thu May 12, 2013  1:28 PM  ------       Message from: Beatrix Shipper       Created: Thu May 12, 2013 12:06 PM       Regarding: med question Maurice: [phone]                       Cheryl Dixon [id number], 58 year old, female, Telephone Information:       Home Phone      [phone]       Work Phone      Not on file.       Mobile          [phone]       Home Phone      Not on file.                     Patient's Preferred Pharmacy:               [facility], [place], [place]       Phone: [phone] Fax: [phone]                     CONFIRMED TODAY: [name] NUMBER: [phone]              Available times:              Patient's language of care: English              Patient does not need an interpreter.              Patient's PCP: [name], MD              Person calling on behalf of patient: Pharmacy              Calls today : calling today because pharmacy has a med questions states adults aren't covered for Acetaminophen (TYLENOL) 167 MG/5ML LIQD                       ------

## 2013-05-12 NOTE — Progress Notes (Signed)
Cheryl Dixon is a 58 year old female pt of Dr. Lisa Roca who presents with abdominal pain    Abdominal pain   Started about 1.5 years ago  Dx with h pylori previously - treated and symptoms resovled  Pain came back about 1 month later, has been off and on since  Continuous for last 2 months   Located midepigastric, feels achy like hunger, no radiation  Has recently been taking nexum 28m daily x 2 month  Pain worse with eating  Also having increased burping  Some nausea as well  Some regurgitation  Seen for problem in the past per chart - has been referred to GI in the past but did not keep appointment  PT denies recorded fever, vomiting, diarrhea, hematochezia, melena, weight loss, early satiety      BP 134/88   Pulse 80   Temp(Src) 98.3 F (36.8 C) (Temporal)   Wt 200 lb (90.719 kg)   BMI 35.44 kg/m2   SpO2 98%   LMP 11/20/1991  Pain Score: 8 (8/10)                Physical Exam   Vitals reviewed.  Constitutional: She appears well-developed and well-nourished. No distress.   HENT:   Head: Normocephalic and atraumatic.   Mouth/Throat: Oropharynx is clear and moist. No oropharyngeal exudate.   Cardiovascular: Normal rate, regular rhythm and normal heart sounds.    No murmur heard.  Pulmonary/Chest: Effort normal and breath sounds normal. No respiratory distress. She has no wheezes. She has no rales.   Abdominal: Soft. Bowel sounds are normal. She exhibits no distension and no mass. There is tenderness (Mild mid epigatric). There is no rebound and no guarding.         ASSESSMENT/PLAN      (789.00) Abdominal pain  (primary encounter diagnosis)  Comment: Consistent with gastritis/PUD. No evidence for acute abdomen on exam. Wuill recheck for h pylori given past infection, and refer to GI as pt having continued pain despite PPI therapy. Will also refill nexium, add zantac PRN for pain relief.  Plan: HELICOBACTER PYLORI STOOL AG (INHOUSE),         esomeprazole (NEXIUM) 40 MG capsule, Zantac 1585m        Pt to book GI appt  today - referral already placed previously       Pt educated on warning signs requiring urgent evaluation         (V76.51) Screening for colon cancer  Comment: pt given screening kit  Plan: POC IMMUNOASSAY FECAL OCCULT BLOOD TEST            (V76.10) Screening for malignant neoplasm of breast  Comment: ordered, pt to schedule in the near future  Plan: [facility] MAMMOGRAPHY SCREENING BILATERAL W CAD            (V04.81) Need for prophylactic vaccination and inoculation against influenza  Comment: done  Plan: IMMUNIZATION ADMIN SINGLE, RN, PR INFLUENZA         VACCINE QUADRIVALENT 3 YRS PLUS IM            (V68.1) Medication refill  Comment: PT requests tylenol for occasional minor pains. States she prefers liquid formulation, feels this is more effective. Advised  Not sure this is covered but could try  Plan: Tylenol liquid sent to pharmacy      We discussed the patients current medications. The patient expressed understanding and no barriers to adherence were identified.   1. The patient indicates understanding of  these issues and agrees with the plan. Brief care plan is updated and reviewed with the patient.   2. The patient is given an After Visit Summary sheet that lists all medications with directions, allergies, orders placed during this encounter, and follow-up instructions.   3. I reviewed the patient's medical information and medical history   4. I reconciled the patient's medication list and prepared and supplied needed refills.   5. I have reviewed the past medical, family, and social history sections including the medications and allergies.

## 2013-05-13 ENCOUNTER — Other Ambulatory Visit (HOSPITAL_BASED_OUTPATIENT_CLINIC_OR_DEPARTMENT_OTHER): Payer: Self-pay

## 2013-05-13 MED ORDER — ACETAMINOPHEN 160 MG/5ML PO SUSP
15.0000 mg/kg | ORAL | Status: DC | PRN
Start: 2013-05-13 — End: 2013-08-19

## 2013-05-13 NOTE — Progress Notes (Signed)
[place].  I had changed it because pharmacy told me liquid was not covered.

## 2013-05-13 NOTE — Progress Notes (Addendum)
Patient requesting acetaminophen in liquid form instead of tablets, forwarded to provider.

## 2013-05-13 NOTE — Telephone Encounter (Signed)
Message copied by Arnette Schaumann on Fri May 13, 2013  3:02 PM  ------       Message from: Lovenia Kim       Created: Fri May 13, 2013  2:12 PM       Regarding: patient wants liquid tylenol not a pill form                       Cheryl Dixon [id number], 58 year old, female, Telephone Information:       Home Phone      [phone]       Work Phone      Not on file.       Mobile          [phone]       Home Phone      Not on file.                     Patient's Preferred Pharmacy:               [facility], [place], [place]       Phone: [phone] Fax: [phone]              [place] NUMBER [phone]:        Best time to call back:        Cell phone:        Other phone:              Available times:              Patient's language of care: English              Patient's PCP: [name], MD              Person calling on behalf of patient: Patient (self)       patient wants liquid tylenol no a pill form       Calls today  ------

## 2013-05-24 ENCOUNTER — Ambulatory Visit: Payer: Self-pay | Admitting: Internal Medicine

## 2013-05-25 ENCOUNTER — Telehealth (HOSPITAL_BASED_OUTPATIENT_CLINIC_OR_DEPARTMENT_OTHER): Payer: Self-pay | Admitting: Ambulatory Care

## 2013-05-25 NOTE — Telephone Encounter (Signed)
Message copied by [name] on Wed May 25, 2013  9:38 AM  ------       Message from: Lovenia Kim       Created: Wed May 25, 2013  9:31 AM       Regarding: Sick:  stomach pain  not getting any better                       Chantea Surace [id number], 58 year old, female, Telephone Information:       Home Phone      [phone]       Work Phone      Not on file.       Mobile          [phone]       Home Phone      Not on file.                     Patient's Preferred Pharmacy:               [facility], [place], [place]       Phone: [phone] Fax: [phone]                                   [place] NUMBER: [phone]       Best time to call back:        Cell phone:        Other phone:              Available times:              Patient's language of care: English              Patient does not need an interpreter.              Patient's PCP: [name], MD              Person calling on behalf of patient: Patient (self)              Calls today with a sick call.                       ------

## 2013-05-25 NOTE — Progress Notes (Addendum)
Called and spoke with pt   She said she is having really bad abd pain in the am  Pt said she sleeps with the [facility] elevated and no relief  She is taking Nexium and Tylenol and it works for a while but the pain comes back really bad  She said her appt was cancelled with GI d/t weather and she can't wait until Monday to be seen    Pt said pain in the am is 9/10 and it is in there sternal area  She denies CP/SOB  She also said she was suppose to bring a stool sample in but has not been able to do so  She said one year ago she was dx with Hpylori    Offered pt an appt with Dr Lisa Roca today but she refused  appt booked for Friday at 910 am    Told pt if the pain gets really bad to please go to the ER to be eval  Try to get a stool sample to the office as soon as she could    Pt verbalize understanding and agrees with plan

## 2013-05-27 ENCOUNTER — Telehealth (HOSPITAL_BASED_OUTPATIENT_CLINIC_OR_DEPARTMENT_OTHER): Payer: Self-pay | Admitting: Internal Medicine

## 2013-05-27 ENCOUNTER — Ambulatory Visit (HOSPITAL_BASED_OUTPATIENT_CLINIC_OR_DEPARTMENT_OTHER): Payer: MEDICARE | Admitting: Internal Medicine

## 2013-05-27 ENCOUNTER — Telehealth (HOSPITAL_BASED_OUTPATIENT_CLINIC_OR_DEPARTMENT_OTHER): Payer: Self-pay

## 2013-05-27 ENCOUNTER — Encounter (HOSPITAL_BASED_OUTPATIENT_CLINIC_OR_DEPARTMENT_OTHER): Payer: Self-pay | Admitting: Internal Medicine

## 2013-05-27 VITALS — BP 136/91 | HR 64 | Temp 98.6°F | Wt 208.2 lb

## 2013-05-27 DIAGNOSIS — Z7189 Other specified counseling: Secondary | ICD-10-CM

## 2013-05-27 DIAGNOSIS — R109 Unspecified abdominal pain: Secondary | ICD-10-CM

## 2013-05-27 DIAGNOSIS — F329 Major depressive disorder, single episode, unspecified: Secondary | ICD-10-CM

## 2013-05-27 DIAGNOSIS — E669 Obesity, unspecified: Secondary | ICD-10-CM

## 2013-05-27 DIAGNOSIS — Z0000 Encounter for general adult medical examination without abnormal findings: Secondary | ICD-10-CM

## 2013-05-27 DIAGNOSIS — IMO0001 Reserved for inherently not codable concepts without codable children: Secondary | ICD-10-CM

## 2013-05-27 DIAGNOSIS — F1120 Opioid dependence, uncomplicated: Secondary | ICD-10-CM

## 2013-05-27 DIAGNOSIS — F32A Depression, unspecified: Secondary | ICD-10-CM

## 2013-05-27 DIAGNOSIS — R1013 Epigastric pain: Secondary | ICD-10-CM

## 2013-05-27 DIAGNOSIS — F17200 Nicotine dependence, unspecified, uncomplicated: Secondary | ICD-10-CM

## 2013-05-27 LAB — CBC WITH PLATELET
HEMATOCRIT: 43.7 % (ref 34.1–44.9)
HEMOGLOBIN: 13.9 g/dL (ref 11.2–15.7)
MEAN CORP HGB CONC: 31.8 g/dL (ref 31.0–37.0)
MEAN CORPUSCULAR HGB: 27.7 pg (ref 26.0–34.0)
MEAN CORPUSCULAR VOL: 87.1 fL (ref 80.0–100.0)
MEAN PLATELET VOLUME: 10.1 fL (ref 8.7–12.5)
PLATELET COUNT: 290 10*3/uL (ref 150–400)
RBC DISTRIBUTION WIDTH STD DEV: 42.7 fL (ref 35.1–46.3)
RBC DISTRIBUTION WIDTH: 13.5 % (ref 11.5–14.3)
RED BLOOD CELL COUNT: 5.02 M/uL (ref 3.90–5.20)
WHITE BLOOD CELL COUNT: 8.5 10*3/uL (ref 4.0–11.0)

## 2013-05-27 LAB — POC IMMUNOASSAY FECAL OCCULT BLOOD TEST: POC FECAL OCCULT BLOOD TEST (IMMUNOASSAY): POSITIVE

## 2013-05-27 MED ORDER — BUPROPION HCL ER (XL) 300 MG PO TB24
300.0000 mg | ORAL_TABLET | Freq: Every morning | ORAL | Status: DC
Start: 2013-05-27 — End: 2013-07-27

## 2013-05-27 MED ORDER — NICOTINE 7 MG/24HR TD PT24
1.00 | MEDICATED_PATCH | Freq: Every day | TRANSDERMAL | Status: AC
Start: 2013-05-27 — End: 2013-08-05

## 2013-05-27 MED ORDER — FLUOXETINE HCL 10 MG PO CAPS
ORAL_CAPSULE | ORAL | Status: DC
Start: 2013-05-27 — End: 2013-07-04

## 2013-05-27 MED ORDER — SUCRALFATE 1 G PO TABS
1.0000 g | ORAL_TABLET | Freq: Four times a day (QID) | ORAL | Status: DC
Start: 2013-05-27 — End: 2013-07-04

## 2013-05-27 NOTE — Telephone Encounter (Signed)
Message copied by Arnette Schaumann on Fri May 27, 2013  3:41 PM  ------       Message from: Andrena Mews       Created: Fri May 27, 2013  3:04 PM       Regarding: Stomach pain meds       Contact: [place] [id number], 58 year old, female, Telephone Information:       Home Phone      [phone]       Mobile          [phone]              CONFIRMED TODAY: [name] NUMBER: [phone]              Patient's language of care: English              Patient's PCP: [name], MD              Person calling on behalf of patient: Patient (self)              Calls today about sucralfate (CARAFATE) 1 GM tablet. States took one pill around 12 pm and her stomach pain is getting any better. Wanted to know if she can be prescribed something different.                        ------

## 2013-05-27 NOTE — Progress Notes (Signed)
58 year old female seen in urgent care complaining of:    Stomach pain  Feels like someone is squeezing my esophagus  Taking tylenol and advil, each of worked for a short time  Taking ranitidine and esomeprazole as well.  Worst pain is in the morning.  Problems first started when she had h.pylori 1.5 years ago  Worse over the last 3 months.  Eating carbs to help symptoms - but that doesn't help.  Location is epigastric.  Some loss of appetite  Food does not make pain better or worse  No coffee  No alcohol  No soda  Cigarettes down to 1-2 per day  Would like to start patch    Depression improved  Fluoxetine is working better.  Also taking wellbutrin.  No longer taking clonazepam.  Depression is improved since changing med.  Started prozac about 3 weeks ago.  Taking wellbutrin as well.  No problems with ,side effects of the medication  PHQ-9 TOTAL SCORE 05/27/2013 05/02/2013 02/14/2013   Doc FlowSheet Total Score 16 24 23      BP 136/91   Pulse 64   Temp(Src) 98.6 F (37 C)   Wt 208 lb 3.2 oz (94.439 kg)   BMI 36.89 kg/m2   SpO2 98%   LMP 11/20/1991  Affect anxious  OP clear  Neck normal without masses  S1 and S2 normal, no murmurs, clicks, gallops or rubs. Regular rate and rhythm. Chest is clear; no wheezes or rales. No edema or JVD.  Breast exam deferred  The abdomen is soft with mild LLQ tenderness, no guarding, mass, rebound or organomegaly. Bowel sounds are hypoactive. No CVA tenderness or inguinal adenopathy noted.  Neuro nonfocal    Assessment and plan:  Routine HCM  - referred for mammogram    Epigastric pain  Persistent  - stool for repeat h.pylori  However she has not stopped PPI so negative test may be a false negative  - continue sucralfate  - reschedule with GI    Depression  Improved.  Motivation and mood better.  symptoms reported are primarily related to stomach pain -- interfering with sleep, appetite, etc  Continue fluoxetine at current dose  Epigastric pain predates medication    Opioid dependence  -  check EKG for med monitoring on methadone  - screen for hep C and hep B immunity    Obesity  - check a1c    Smoking.  Action phase  - nicotine gum  - The patient was educated regarding the risks, benefits, and potential side effects of new medication.      We discussed the patients current medications. The patient expressed understanding and no barriers to adherence were identified.  1. The patient indicates understanding of these issues and agrees with the plan.  Brief care plan is updated and reviewed with the patient.   2. The patient is given an After Visit Summary sheet that lists all medications with directions, allergies, orders placed during this encounter, and follow-up instructions.   3. I reviewed the patient's medical information and medical history   4. I reconciled the patient's medication list and prepared and supplied needed refills.   5. I have reviewed the past medical, family, and social history sections including the medications and allergies.    [name], MD

## 2013-05-27 NOTE — Progress Notes (Addendum)
Please let patient know that her opioid treatment program has requested a recent EKG.  Our most recent EKG on file is more than 56 year old.  Please ask her to schedule an appointment with me so that we can do an EKG.    (she is also due for mammogram, stool cards, and CPE).    Form for program place in Devanshi Califf red folder.

## 2013-05-27 NOTE — Progress Notes (Signed)
Called patient back, advised she give med a little more time, advised she needs to take med four times a day, advised if no effect in next couple of days to let us know, she agreed with this plan.

## 2013-05-27 NOTE — Progress Notes (Signed)
Never mind.  She has an appointment scheduled with me today.

## 2013-05-27 NOTE — Progress Notes (Deleted)
Form for opioid treatment program placed in Cheryl Dixon red folder.

## 2013-05-28 ENCOUNTER — Encounter (HOSPITAL_BASED_OUTPATIENT_CLINIC_OR_DEPARTMENT_OTHER): Payer: Self-pay | Admitting: Internal Medicine

## 2013-05-28 DIAGNOSIS — R195 Other fecal abnormalities: Secondary | ICD-10-CM | POA: Insufficient documentation

## 2013-05-28 DIAGNOSIS — F332 Major depressive disorder, recurrent severe without psychotic features: Secondary | ICD-10-CM | POA: Insufficient documentation

## 2013-05-28 LAB — EKG

## 2013-05-28 NOTE — Progress Notes (Signed)
Quick Note:    Positive FOBT noted. Awaiting h.pylori  ______

## 2013-05-28 NOTE — Progress Notes (Signed)
Quick Note:    Please send letter with test results and this comment:    The stool test was positive for blood. All other labs are within acceptable limits. We're still awaiting the results of the h.pylori test.    ______

## 2013-05-29 LAB — HELICOBACTER PYLORI STOOL AG

## 2013-05-30 LAB — HEMOGLOBIN A1C
ESTIMATED AVERAGE GLUCOSE: 123 (ref 74–160)
HEMOGLOBIN A1C: 5.9 % — ABNORMAL HIGH (ref 4.0–5.6)

## 2013-05-30 NOTE — Progress Notes (Signed)
Quick Note:    Please send a letter from me with the following message:    Your recent test was negative for h pylori, a bacteria that can cause stomach pains. However, since you have been taking nexium, this test could be falsely negative, so please be sure to reschedule your appointment with the GI specialist to follow up on your symptoms . Please call the clinic if you have any questions.    ______

## 2013-05-31 ENCOUNTER — Telehealth (HOSPITAL_BASED_OUTPATIENT_CLINIC_OR_DEPARTMENT_OTHER): Payer: Self-pay

## 2013-05-31 NOTE — Progress Notes (Signed)
Received message regarding carafate, forwarded to provider.

## 2013-05-31 NOTE — Progress Notes (Signed)
Quick Note:    Please send letter with test results and this comment:    Your recent test was negative for h pylori, a bacteria that can cause stomach pains. However, since you have been taking nexium, this test could be falsely negative, so please be sure to reschedule your appointment with the GI specialist to follow up on your symptoms, if you haven't already. The number to call is [phone]. In addition the stool test was positive for occult blood (blood that we can't see), all the more reason to follow up with GI.     All other labs are within acceptable limits except for slightly high blood sugar. Please call the clinic if you have any questions.    ______

## 2013-05-31 NOTE — Telephone Encounter (Signed)
Message copied by Arnette Schaumann on Tue May 31, 2013  1:31 PM  ------       Message from: Lorre Munroe       Created: Tue May 31, 2013 11:56 AM         Good Afternoon,               I spoke to Hisako today and re-scheduled her Gastro appointment for 08/03/2013. She states that day and time are fine.        She asked if i would send you both a message to let you know that she has been Pain FREE for 3 days with the new medication you gave her Friday. She is very happy about this.               Thank you,               Carmelina Dane  ------

## 2013-05-31 NOTE — Progress Notes (Signed)
.  Mail letter with results for pt.  Cheryl Dixon, [place], 05/31/2013, 7:51 PM

## 2013-06-02 ENCOUNTER — Telehealth (HOSPITAL_BASED_OUTPATIENT_CLINIC_OR_DEPARTMENT_OTHER): Payer: Self-pay | Admitting: Ambulatory Care

## 2013-06-02 DIAGNOSIS — F332 Major depressive disorder, recurrent severe without psychotic features: Secondary | ICD-10-CM

## 2013-06-02 DIAGNOSIS — Z7189 Other specified counseling: Secondary | ICD-10-CM | POA: Insufficient documentation

## 2013-06-02 DIAGNOSIS — M7918 Myalgia, other site: Secondary | ICD-10-CM | POA: Insufficient documentation

## 2013-06-02 DIAGNOSIS — F1194 Opioid use, unspecified with opioid-induced mood disorder: Secondary | ICD-10-CM | POA: Insufficient documentation

## 2013-06-02 LAB — HEPATIC FUNCTION PANEL
ALANINE AMINOTRANSFERASE: 21 U/L (ref 12–45)
ALBUMIN: 4.2 g/dL (ref 3.4–5.0)
ALKALINE PHOSPHATASE: 80 U/L (ref 45–117)
ASPARTATE AMINOTRANSFERASE: 16 U/L (ref 8–34)
BILIRUBIN DIRECT: 0.1 mg/dl (ref 0.0–0.2)
BILIRUBIN TOTAL: 0.4 mg/dL (ref 0.2–1.0)
INDIRECT BILIRUBIN: 0.3 mg/dL (ref 0.2–0.9)
TOTAL PROTEIN: 7.6 g/dL (ref 6.4–8.2)

## 2013-06-02 LAB — HEPATITIS B SURFACE ANTIBODY: HEPATITIS B SURFACE ANTIBODY: NONREACTIVE

## 2013-06-02 LAB — HEPATITIS C ANTIBODY: HEPATITIS C ANTIBODY: NONREACTIVE

## 2013-06-02 LAB — THYROID SCREEN TSH REFLEX FT4: THYROID SCREEN TSH REFLEX FT4: 2.13 u[IU]/mL (ref 0.358–3.740)

## 2013-06-02 LAB — HEPATITIS B SURFACE ANTIGEN: HEPATITIS B SURFACE ANTIGEN: NONREACTIVE

## 2013-06-02 LAB — HEPATITIS A ANTIBODY TOTAL: HEPATITIS A ANTIBODY TOTAL: NONREACTIVE

## 2013-06-02 NOTE — Progress Notes (Signed)
Notes Recorded by [name] on 05/31/2013 at 9:28 AM  Please send letter with test results and this comment:    Your recent test was negative for h pylori, a bacteria that can cause stomach pains. However, since you have been taking nexium, this test could be falsely negative, so please be sure to reschedule your appointment with the GI specialist to follow up on your symptoms, if you haven't already. The number to call is [phone]. In addition the stool test was positive for occult blood (blood that we can't see), all the more reason to follow up with GI.     All other labs are within acceptable limits except for slightly high blood sugar. Please call the clinic if you have any questions.

## 2013-06-02 NOTE — Telephone Encounter (Signed)
Message copied by [name] on Thu Jun 02, 2013 12:45 PM  ------       Message from: Andrena Mews       Created: Thu Jun 02, 2013 12:34 PM       Regarding: Lab Results       Contact: [phone]                       Nadie Fiumara [id number], 58 year old, female, Telephone Information:       Home Phone      [phone]       Mobile          [phone]              Patient's Preferred Pharmacy:               [facility], [place], [place]       Phone: [phone] Fax: [phone]                     CONFIRMED TODAY: [name] NUMBER: [phone]              Patient's language of care: English              Patient's PCP: [name], MD              Person calling on behalf of patient: Patient (self)              Calls today for test result(s).                       ------

## 2013-06-02 NOTE — Progress Notes (Signed)
Called and spoke with pt   Read result message from Dr Lisa Roca as copied below    Pt said she will keep GI appt to f/u on blood in stool and abd pain    She said Dr Ronalee Red had asked her to have her thyroid checked and she forgot to mention it    Discussed elevated A1c  Pt said she knew it was high because when she was having issues with the stomach she was eating lots of crackers, breads, cakes etc. But she has since cut out a lot of those foods    Told her a message will be sent to Dr Lisa Roca to place the order for TSH and she can stop the lab any day next wk    Pt verbalize understanding and agrees with plan

## 2013-06-02 NOTE — Progress Notes (Signed)
No need to come back in.  I added the thyroid test to the blood we drew previously  Should have result tomorrow

## 2013-06-03 NOTE — Progress Notes (Addendum)
THYROID SCREEN TSH REFLEX FT4 2.130 0.358 - 3.740 uIU/mL Final            Called and spoke with pt  Explained that TSH was added to last blood work  She said she will to let Dr Lisa Roca know that Carafate is really helping her stomach  Result of TSH given to pt, wnr    Message sent to Dr Lisa Roca

## 2013-06-06 DIAGNOSIS — R7303 Prediabetes: Secondary | ICD-10-CM | POA: Insufficient documentation

## 2013-06-06 NOTE — Progress Notes (Signed)
Labs all normal except for a1c which is the prediabetes range.    "All labs are within acceptable limits except for elevated blood sugar.  Your blood sugar is in the borderline or "prediabetes" range.  This means that you are at high risk to develop diabetes unless you lose weight through exercise and changes in your diet."    Will send diet guidelines with result letter, and glad to schedule nutrition visit if she likes.

## 2013-06-06 NOTE — Progress Notes (Signed)
Call returned/placed to pt no answer, left message on VM to call clinic back at [phone]

## 2013-06-08 NOTE — Progress Notes (Signed)
Called and spoke with pt  She said she is having the abd pain again    She states pain is 6/10 after taking liquid tylenol    She said the carafate was working but she noticed about 2 wks after taking medicines they stop working    She denies eating any spicy foods  Does not aly down shortly after eating  No caffeine     Pt said she vomited this morning but thinks she is still able to keep most foods down    She said her appt with GI is not until April and she is wondering if Dr Lisa Roca can have her appt moved to something sooner    Told her nurse will discuss with Dr Lisa Roca and someone will call her back  Advise to cont bland foods, crackers,  Oatmeal, jello, ginger ale, banana etc      Pt verbalize understanding and agrees with plan    Message sent to Dr Lisa Roca, please advise

## 2013-06-08 NOTE — Telephone Encounter (Signed)
Message copied by [name] on Wed Jun 08, 2013  9:39 AM  ------       Message from: AZEREDO, LEZETTE       Created: Wed Jun 08, 2013  9:23 AM       Regarding: Abdominal pain         Cheryl Dixon [id number], 59 year old, female, Telephone Information:       Home Phone      [phone]       Mobile          [phone]              CONFIRMED TODAY: Cheryl Dixon NUMBER: [phone]              Patient's language of care: English              Patient's PCP: [name], MD              Person calling on behalf of patient: Patient (self)              Calls today- patient had a phone call last week- told the nurse that she was doing well, now is having a lot of abdominal pain. Requesting to speak to Dr. Lisa Roca or nurse. Refuse appointment.                        ------

## 2013-06-09 NOTE — Progress Notes (Signed)
Email to Dr. Owens Shark to request sooner appt.

## 2013-06-23 ENCOUNTER — Other Ambulatory Visit (HOSPITAL_BASED_OUTPATIENT_CLINIC_OR_DEPARTMENT_OTHER): Payer: Self-pay

## 2013-06-23 NOTE — Telephone Encounter (Signed)
Letter sent to pt, needs appt for depression f/u   Cheryl Dixon [place], 06/23/2013, 2:31 PM

## 2013-07-04 ENCOUNTER — Other Ambulatory Visit (HOSPITAL_BASED_OUTPATIENT_CLINIC_OR_DEPARTMENT_OTHER): Payer: Self-pay | Admitting: Internal Medicine

## 2013-07-04 ENCOUNTER — Other Ambulatory Visit (HOSPITAL_BASED_OUTPATIENT_CLINIC_OR_DEPARTMENT_OTHER): Payer: Self-pay | Admitting: Physician Assistant

## 2013-07-04 NOTE — Progress Notes (Signed)
I spoke to patient.  She is off sucralfate, doing OK if she takes both nexium and ranitidine.  Reviewed she should still see GI and not take this much medicine for a long time without more eval.  She agrees.  Refilled ranitidine for two months.  Is watching diet better as well.

## 2013-07-04 NOTE — Progress Notes (Signed)
PER Pharmacy, Cheryl Dixon is a 58 year old female has requested a refill of ranitidine, nexium.      Last Office Visit: 05-27-13  Last Physical Exam: 05-27-13      Other Med Adult:  Most Recent BP Reading(s)  05/27/13 : 136/91      Cholesterol (mg/dl)   Date  Value    01/04/2010  264*   ----------  LDL DIRECT (mg/dl)   Date  Value    01/04/2010  101*   ----------  HDL (mg/dl)   Date  Value    01/04/2010  38    ----------  No results found for this basename: tg      THYROID SCREEN TSH REFLEX FT4 (uIU/mL)   Date  Value    05/27/2013  2.130    ----------      No results found for this basename: TSH    HEMOGLOBIN A1C (%)   Date  Value    05/27/2013  5.9*   ----------      INR (no units)   Date  Value    02/16/2007  1.0*    07/03/2006  < 1.0*   ----------       Documented patient preferred pharmacies:    [facility], [place], [place] - [place]  Phone: [phone] Fax: [phone]

## 2013-07-04 NOTE — Progress Notes (Signed)
PER Pharmacy, Cheryl Dixon is a 58 year old female has requested a refill of bupropion 150 mg 24 hr, sucralfate 1 gm, polyethylene glycol powder, & fluoxetine 10 mg.  - Please note: Patient filled last refills for bupropion, polyethylene glycol powder, and fluoxetine today.  Bupropion 150 mg 24 hr was discontinued 02/15/13.  Please review.      Last Office Visit: 05/27/13  Last Physical Exam: 05/27/13      Other Med Adult:  Most Recent BP Reading(s)  05/27/13 : 136/91      Cholesterol (mg/dl)   Date  Value    01/04/2010  264*   ----------  LDL DIRECT (mg/dl)   Date  Value    01/04/2010  101*   ----------  HDL (mg/dl)   Date  Value    01/04/2010  38    ----------  No results found for this basename: tg      THYROID SCREEN TSH REFLEX FT4 (uIU/mL)   Date  Value    05/27/2013  2.130    ----------      No results found for this basename: TSH    HEMOGLOBIN A1C (%)   Date  Value    05/27/2013  5.9*   ----------      INR (no units)   Date  Value    02/16/2007  1.0*    07/03/2006  < 1.0*   ----------       Documented patient preferred pharmacies:    [facility], [place], [place] - [place]  Phone: [phone] Fax: [phone]

## 2013-07-04 NOTE — Progress Notes (Signed)
Further clarification with patient  She is taking ranitidine and sucralfate, NOT nexium.  Will follow up with GI  Call pharmacy to cancel nexium Rx.

## 2013-07-25 ENCOUNTER — Encounter: Payer: Self-pay | Admitting: Podiatry

## 2013-07-25 ENCOUNTER — Ambulatory Visit (INDEPENDENT_AMBULATORY_CARE_PROVIDER_SITE_OTHER): Payer: 59 | Admitting: Podiatry

## 2013-07-25 ENCOUNTER — Ambulatory Visit (INDEPENDENT_AMBULATORY_CARE_PROVIDER_SITE_OTHER): Payer: 59

## 2013-07-25 DIAGNOSIS — M79609 Pain in unspecified limb: Secondary | ICD-10-CM

## 2013-07-25 DIAGNOSIS — M779 Enthesopathy, unspecified: Secondary | ICD-10-CM

## 2013-07-25 NOTE — Progress Notes (Signed)
   Subjective:    Patient ID: Michelle Roberts, female    DOB: 1955/09/01, 58 y.o.   MRN: [id number]  HPI PT STATED RT FOOT 4TH AND LT 5TH TOE ARE SORE. BOTH TOES ARE GETTING WORSE. THE TOES GET AGGRAVATED BY WEARING SHOES AND LT 4TH TOE THE SKIN IS GETTING THICKER. TRIED TO SOAK WITH EPSON SALT WITH BAKING SODA AND TRIM THE CORN IT HELPS SOME.  Patient has undergone previous foot surgery   Review of Systems  All other systems reviewed and are negative.       Objective:   Physical Exam  58 year old black female orientated x3  Vascular: DP and PT pulses are 2/4 bilaterally  Neurological: Knee and ankle reflexes equal and reactive bilaterally  Dermatological keratoses fifth digit left and fourth right  X-ray report left foot  Fifth digit left demonstrate no middle and distal phalanx. The third and fourth toe have no middle phalanx. There are no x-ray signs of rheumatoid changes in the left foot.  Radiographic impression: Evidence of surgical bone removal third, fourth, fifth toe left foot.    Assessment & Plan:   Assessment: Chronic reactive fibrous tissue with symptoms in the fifth digit left foot Overlying hyperkeratotic tissue fifth digit left  Friction rub associated with fourth and fifth digit left from previous surgery.  Plan: I debrided the superficial hyperkeratotic tissue on the fifth digit left and dispensed a protective toe sleeve. This patient is on significant medication for her rheumatoid arthritis would get authorization to inject dexamethasone into the fifth digit if symptoms persisted.  Also, medication wear that amputation fifth digit left could be an option.  Reappoint at patient's request

## 2013-07-25 NOTE — Patient Instructions (Signed)
If the fifth toe pain does not improve consult with your rheumatologist for permission to inject dexamethasone phosphate into the area.

## 2013-08-01 ENCOUNTER — Telehealth: Payer: Self-pay | Admitting: *Deleted

## 2013-08-01 NOTE — Telephone Encounter (Signed)
I was there on Monday.  He had mentioned he couldn't give me an injection in my toe until he spoke to my Rheumatologist.  I saw Dr. Zella Richer on today for a treatment.  She said for Dr. Amalia Hailey to contact her.  Shes doesn't see any problems.  Call her to discuss.  What he did on Monday didn't work.  I need the injection.  Michelle Roberts  [phone] phone, [phone] fax

## 2013-08-03 ENCOUNTER — Telehealth (HOSPITAL_BASED_OUTPATIENT_CLINIC_OR_DEPARTMENT_OTHER): Payer: Self-pay | Admitting: Ambulatory Care

## 2013-08-03 ENCOUNTER — Ambulatory Visit (HOSPITAL_BASED_OUTPATIENT_CLINIC_OR_DEPARTMENT_OTHER): Payer: MEDICARE | Admitting: Gastroenterology

## 2013-08-03 NOTE — Progress Notes (Addendum)
Depression f/u call placed to pt wh had the dose of Wellbutrin changes to 150 mg QD in addition to Prozac    Pt denies any side effects  No insomnia  or anxiety    She said her GI sxn are not related to meds but will be seeing GI and she will call to make a Depression f/u appt with Dr Lisa Roca and f/u from GI appt    Pt said she is doing fine

## 2013-08-05 NOTE — Telephone Encounter (Signed)
I left her a message that we are going to send Dr. Patrecia Pour a letter of medical clearance.  Once we get the clearance we will call her to come in for an appointment with Dr. Amalia Hailey.

## 2013-08-08 ENCOUNTER — Encounter: Payer: Self-pay | Admitting: *Deleted

## 2013-08-10 ENCOUNTER — Telehealth (HOSPITAL_BASED_OUTPATIENT_CLINIC_OR_DEPARTMENT_OTHER): Payer: Self-pay | Admitting: Ambulatory Care

## 2013-08-10 ENCOUNTER — Telehealth: Payer: Self-pay | Admitting: *Deleted

## 2013-08-10 DIAGNOSIS — F332 Major depressive disorder, recurrent severe without psychotic features: Secondary | ICD-10-CM

## 2013-08-10 MED ORDER — FLUOXETINE HCL 20 MG PO CAPS
20.0000 mg | ORAL_CAPSULE | Freq: Every day | ORAL | Status: DC
Start: 2013-08-10 — End: 2013-11-19

## 2013-08-10 MED ORDER — SUCRALFATE 1 G PO TABS
1.0000 g | ORAL_TABLET | Freq: Four times a day (QID) | ORAL | Status: DC
Start: 2013-08-10 — End: 2013-09-28

## 2013-08-10 NOTE — Progress Notes (Signed)
Tell her I would recommend increasing fluoxetine to 20 mg.  Prescription sent electronically to preferred pharmacy.     Refill sent in for carafate.

## 2013-08-10 NOTE — Progress Notes (Addendum)
Called and spoke with pt  She said she is taking the porxac and Wellbutrin and it is really working well but she is tired lately    She said she missed her appt with Dr Owens Shark because she showed up on the wrong day again    PHQ9 done   Score 17 improved from 24    Pt told an appt is needed with Dr Lisa Roca    She said she will make f/u appt with GI first and then call back to make appt with Dr Lisa Roca for f/u Depression    Pt requesting refill of Carafate  Message sent to Dr Lisa Roca

## 2013-08-10 NOTE — Telephone Encounter (Signed)
Message copied by Lolita Rieger on Wed Aug 10, 2013  1:45 PM ------      Message from: Gean Birchwood      Created: Wed Aug 10, 2013 12:20 PM       Please contact patient to arrange an office visit to give steroid injection into the fifth left toe.            Have received medical clearance from Dr.Deveshwar via fax dated 08/08/2013, received 08/09/2013 ------

## 2013-08-10 NOTE — Telephone Encounter (Signed)
Per Dr. Amalia Hailey, I called and informed the patient that we received medical clearance.  We can get her in for an appointment.  She stated I hope he can get me in this week because I'm going out of town on Sunday.  I informed her he will not be back here until Monday.  I sent her to a scheduler to make an appointment.

## 2013-08-11 ENCOUNTER — Ambulatory Visit (INDEPENDENT_AMBULATORY_CARE_PROVIDER_SITE_OTHER): Payer: 59 | Admitting: Podiatry

## 2013-08-11 ENCOUNTER — Encounter: Payer: Self-pay | Admitting: Podiatry

## 2013-08-11 VITALS — BP 114/80 | HR 70 | Resp 12

## 2013-08-11 DIAGNOSIS — L84 Corns and callosities: Secondary | ICD-10-CM

## 2013-08-11 DIAGNOSIS — M779 Enthesopathy, unspecified: Secondary | ICD-10-CM

## 2013-08-11 MED ORDER — TRIAMCINOLONE ACETONIDE 10 MG/ML IJ SUSP
10.0000 mg | Freq: Once | INTRAMUSCULAR | Status: AC
  Administered 2013-08-11: 10 mg

## 2013-08-11 NOTE — Progress Notes (Signed)
Called and spoke with pt  Told her script for Carafate sent to pharmacy    He advised an increase in Prozac to 20 mg d/t increase fatigue  Refill of Prozac also sent to pharmacy    Told pt nurse will call in 3 wks to check on effects of increase dose    Pt verbalize understanding and agrees with plan

## 2013-08-12 ENCOUNTER — Telehealth (HOSPITAL_BASED_OUTPATIENT_CLINIC_OR_DEPARTMENT_OTHER): Payer: Self-pay | Admitting: Ambulatory Care

## 2013-08-12 NOTE — Progress Notes (Addendum)
Not much more that I can do at this point unless there is better availability in [place] or [place] and she is willing to go there.  From medical GI standpoint it is not an urgent issue so I don't really have grounds to appeal for an earlier appt.  Please let patient know.   She can always call periodically to see if there is an cancellation and some people get in sooner that way.

## 2013-08-12 NOTE — Progress Notes (Signed)
Called and spoke with pt   Told her Dr Lisa Roca does not think her issue is urgent from GI standpoint to appeal a sooner appt  [employer] as an alternative but pt said she really do not like to travel  Suggest she call [facility] to be placed on a cancellation list    Pt said she will wait because the medicine is working but she will call to be placed on the list    Pt verbalize understanding and agrees with plan

## 2013-08-12 NOTE — Progress Notes (Signed)
Called and spoke with pt  She said she called to reschedule appt with GI  appt booked for June but she will like to be seen sooner    She said she was told to call PCP to have him call to request a sooner appt  Pt said d/t depression she was so tired she forgot her appt and showed up on the wrong dates    Message sent to PCP

## 2013-08-12 NOTE — Telephone Encounter (Signed)
Message copied by [name] on Fri Aug 12, 2013 12:54 PM  ------       Message from: Beatrix Shipper       Created: Fri Aug 12, 2013 11:51 AM       Regarding: to speak to a nurse        Contact: [place] [id number], 58 year old, female, Telephone Information:       Home Phone      [phone]       Work Phone      Not on file.       Mobile          [phone]       Home Phone      Not on file.                     Patient's Preferred Pharmacy:               [facility], [place], [place]       Phone: [phone] Fax: [phone]                     CONFIRMED TODAY: Cheryl Dixon NUMBER: [phone]                     Patient's language of care: English              Patient does not need an interpreter.              Patient's PCP: [name], MD              Person calling on behalf of patient: Patient (self)              Calls today : pt calling to speak to a nurse in regards to a gastro appointment . States shes in a slot of pain . Has an appt. But needs a sooner one . And was told she had to speak to a nurse .                        ------

## 2013-08-12 NOTE — Progress Notes (Signed)
Subjective:     Patient ID: Michelle Roberts, female   DOB: Mar 31, 1956, 58 y.o.   MRN: [id number]  HPI patient points the left fifth toe states it's been getting very tender and she is due to go out of town and she cannot wear shoes   Review of Systems     Objective:   Physical Exam Neurovascular status intact with inflamed fluid around the left fifth toe interphalangeal joint and keratotic lesion formation with pain    Assessment:     Capsulitis with inflammation in hammertoe deformity with keratotic tissue formation left fifth toe    Plan:     Explained condition and did a proximal nerve block I then injected into the interphalangeal joint to milligrams dexamethasone Kenalog and debrided the lesion fully. Reappoint as needed may require surgery at one point in future

## 2013-08-18 ENCOUNTER — Other Ambulatory Visit (HOSPITAL_BASED_OUTPATIENT_CLINIC_OR_DEPARTMENT_OTHER): Payer: Self-pay | Admitting: Internal Medicine

## 2013-08-18 ENCOUNTER — Telehealth (HOSPITAL_BASED_OUTPATIENT_CLINIC_OR_DEPARTMENT_OTHER): Payer: Self-pay | Admitting: Internal Medicine

## 2013-08-18 NOTE — Telephone Encounter (Signed)
Message copied by Rosalin Hawking on Thu Aug 18, 2013 10:16 AM  ------       Message from: Fayette Pho A.       Created: Thu Aug 18, 2013 10:10 AM       Regarding: Refill         [facility]              Person calling on behalf of patient: Patient (self)              May list multiple medications in this section              Medicine Name: acetaminophen (TYLENOL) 160 MG/5ML suspension              Documented patient preferred pharmacies:        [facility], [place], [place]       Phone: [phone] Fax: [phone]              Patient's language of care: English  ------

## 2013-08-18 NOTE — Telephone Encounter (Signed)
This encounter was opened in error.  Please disregard.

## 2013-08-18 NOTE — Telephone Encounter (Signed)
Message copied by Lannie Fields on Thu Aug 18, 2013 10:36 AM  ------       Message from: Fayette Pho A.       Created: Thu Aug 18, 2013 10:08 AM       Regarding: requesting a return call from RN.       Contact: [place] [id number], 58 year old, female, Telephone Information:       Home Phone      [phone]       Work Phone      Not on file.       Mobile          [phone]       Home Phone      Not on file.              CALL BACK NUMBER:[phone]              Patient's language of care: English              Patient's PCP: [name], MD              Person calling on behalf of patient: Patient (self)              Calls today requesting a return call from RN.  ------

## 2013-08-18 NOTE — Progress Notes (Signed)
PER Patient (self),   Cheryl Dixon is a 58 year old female who has requested a refill of:    Acetaminophen 160mg /63ml suspension   This medication has been reordered with the same quantity and refills as last prescribed    Last Office Visit: 05/27/13  Last Physical Exam: 05/27/13      Other Med Adult:  Most Recent BP Reading(s)  05/27/13 : 136/91        Cholesterol (mg/dl)   Date  Value    01/04/2010  264*   ----------    LDL DIRECT (mg/dl)   Date  Value    01/04/2010  101*   ----------    HDL (mg/dl)   Date  Value    01/04/2010  38    ----------    No results found for this basename: tg        THYROID SCREEN TSH REFLEX FT4 (uIU/mL)   Date  Value    05/27/2013  2.130    ----------        No results found for this basename: TSH      HEMOGLOBIN A1C (%)   Date  Value    05/27/2013  5.9*   ----------        INR (no units)   Date  Value    02/16/2007  1.0*    07/03/2006  < 1.0*   ----------       Documented patient preferred pharmacies:    [facility], [place], [place] - [place]  Phone: [phone] Fax: [phone]

## 2013-08-18 NOTE — Progress Notes (Signed)
Opened encounter in error. SO

## 2013-08-18 NOTE — Telephone Encounter (Signed)
Message copied by Lannie Fields on Thu Aug 18, 2013 10:26 AM  ------       Message from: Fayette Pho A.       Created: Thu Aug 18, 2013 10:08 AM       Regarding: requesting a return call from RN.       Contact: [place] [id number], 58 year old, female, Telephone Information:       Home Phone      [phone]       Work Phone      Not on file.       Mobile          [phone]       Home Phone      Not on file.              CALL BACK [phone]              Patient's language of care: English              Patient's PCP: [name], MD              Person calling on behalf of patient: Patient (self)              Calls today requesting a return call from RN.  ------

## 2013-08-18 NOTE — Progress Notes (Signed)
Informed pt that there are refills remaining on apap. Pt will refer back to pharmacy for refills. SO

## 2013-08-19 ENCOUNTER — Telehealth (HOSPITAL_BASED_OUTPATIENT_CLINIC_OR_DEPARTMENT_OTHER): Payer: Self-pay | Admitting: Ambulatory Care

## 2013-08-19 ENCOUNTER — Other Ambulatory Visit (HOSPITAL_BASED_OUTPATIENT_CLINIC_OR_DEPARTMENT_OTHER): Payer: Self-pay | Admitting: Internal Medicine

## 2013-08-19 MED ORDER — ACETAMINOPHEN 160 MG/5ML PO SUSP
650.0000 mg | ORAL | Status: AC | PRN
Start: 2013-08-19 — End: 2014-02-15

## 2013-08-19 MED ORDER — ACETAMINOPHEN 160 MG/5ML PO SUSP
15.0000 mg/kg | ORAL | Status: DC | PRN
Start: 2013-08-19 — End: 2013-08-19

## 2013-08-19 NOTE — Progress Notes (Signed)
PER Pharmacy, Cheryl Dixon is a 58 year old female has requested a refill of MAPAP Suspension.  This medication has been reordered with the same quantity and end date as the last time it was prescribed.         Last Office Visit: 05/27/13  Last Physical Exam: 05/27/13      Other Med Adult:  Most Recent BP Reading(s)  05/27/13 : 136/91      Cholesterol (mg/dl)   Date  Value    01/04/2010  264*   ----------  LDL DIRECT (mg/dl)   Date  Value    01/04/2010  101*   ----------  HDL (mg/dl)   Date  Value    01/04/2010  38    ----------  No results found for this basename: tg      THYROID SCREEN TSH REFLEX FT4 (uIU/mL)   Date  Value    05/27/2013  2.130    ----------      No results found for this basename: TSH    HEMOGLOBIN A1C (%)   Date  Value    05/27/2013  5.9*   ----------      INR (no units)   Date  Value    02/16/2007  1.0*    07/03/2006  < 1.0*   ----------       Documented patient preferred pharmacies:    [facility], [place], [place] - [place]  Phone: [phone] Fax: [phone]

## 2013-08-19 NOTE — Progress Notes (Signed)
Message sent to [facility] and spoke with Pharmacist who will like clarification on the Tylenol order    They said it exceeds the FDA requirement by 3 times  The every 4 hr dose is equivalent to 1360 mg     Please advise

## 2013-08-19 NOTE — Progress Notes (Signed)
Yes, this is correct, it should be 650mg  every 4 hours, so about 58mL per dose. Do I need to send a new script?

## 2013-08-19 NOTE — Telephone Encounter (Signed)
Message copied by [name] on Fri Aug 19, 2013  1:51 PM  ------       Message from: Delia Chimes       Created: Fri Aug 19, 2013  1:48 PM       Regarding: SCript incorrect?                       [name] [id number], 58 year old, female, Telephone Information:       Home Phone      [phone]       Work Phone      Not on file.       Mobile          [phone]       Home Phone      Not on file.                     Patient's Preferred Pharmacy:               [facility], [place], [place]       Phone: [phone] Fax: [phone]                     CONFIRMED TODAY: [name] NUMBER: [phone]              Patient's language of care: English              Patient's PCP: [name], MD              Person calling on behalf of patient: Pharmacy              Calls today - questions concerning dose for acetaminophen (TYLENOL) 160 MG/5ML suspension- states that it exceeds almost 3 times the FDA amount.                 ------

## 2013-08-29 ENCOUNTER — Ambulatory Visit: Payer: 59 | Admitting: Podiatry

## 2013-09-02 ENCOUNTER — Telehealth (HOSPITAL_BASED_OUTPATIENT_CLINIC_OR_DEPARTMENT_OTHER): Payer: Self-pay | Admitting: Ambulatory Care

## 2013-09-02 NOTE — Progress Notes (Addendum)
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

## 2013-09-21 ENCOUNTER — Telehealth (HOSPITAL_BASED_OUTPATIENT_CLINIC_OR_DEPARTMENT_OTHER): Payer: Self-pay | Admitting: Registered Nurse

## 2013-09-21 NOTE — Progress Notes (Signed)
Left VM for pt to return my call. SO

## 2013-09-21 NOTE — Telephone Encounter (Signed)
-----   Message from Lovenia Kim sent at 09/21/2013 11:03 AM EDT -----  Regarding: 1 utility letter gas   2.   needs to speak to a nurse on stomach pain       Cheryl Dixon [id number], 58 year old, female, Telephone Information:  Home Phone      [phone]  Work Phone      Not on file.  Mobile          [phone]  Home Phone      Not on file.      Patient's Preferred Pharmacy:     [facility], [place], [place]  Phone: [phone] Fax: [phone]      CONFIRMED TODAY: [name] NUMBER:  [phone]    Best time to call back:   Cell phone:   Other phone:    Available times:    Patient's language of care: English    Patient does not need an interpreter.    Patient's PCP: [name], MD    Person calling on behalf of patient: Patient (self)    1 utility letter gas   2.   needs to speak to a nurse on stomach pain         Calls today requesting a letter for: Utilities   Name of Utility: gas   Name on Utility Account:same  Utility Account Number: [id number]  Fax Number to send Letter: [phone]

## 2013-09-21 NOTE — Progress Notes (Signed)
Pt states reflux is not getting better. Was on carafate x1 week and prior to that Nexium x 1 week with no relief. She has appt with GI 09/30/13. Pt is wondering if you rx anything else to help her with reflux. Advised her to sleep w/HOB elevated, avoid spicy, oily or caffeine containing food. She agrees.

## 2013-09-21 NOTE — Progress Notes (Signed)
Left VM for pt again to schedule appt for stomach pain. Letter drafted and forwarded to MD for review. SO

## 2013-09-21 NOTE — Progress Notes (Signed)
Printed  [place], 09/21/2013, 5:48 PM

## 2013-09-21 NOTE — Progress Notes (Signed)
Yes agree with lifestyle changes and beyond what I've already prescribed we need GI help at this point.

## 2013-09-23 ENCOUNTER — Telehealth (HOSPITAL_BASED_OUTPATIENT_CLINIC_OR_DEPARTMENT_OTHER): Payer: Self-pay | Admitting: Registered Nurse

## 2013-09-23 NOTE — Progress Notes (Signed)
Pls see message from pt.     SO

## 2013-09-23 NOTE — Telephone Encounter (Signed)
-----   Message from Lovenia Kim sent at 09/23/2013 10:27 AM EDT -----  Regarding: fyi: stomach is feeling better and she cnacled apt with Dr Lisa Roca and did not resch       [name] [id number], 58 year old, female, Telephone Information:  Home Phone      [phone]  Work Phone      Not on file.  Mobile          [phone]  Home Phone      Not on file.      Patient's Preferred Pharmacy:     [facility], [place], [place]  Phone: [phone] Fax: [phone]      CONFIRMED TODAY: Yes    CALL BACK NUMBER:   Best time to call back:   Cell phone:   Other phone:    Available times:    Patient's language of care: English    Patient does not need an interpreter.    Patient's PCP: [name], MD    Person calling on behalf of patient: Patient (self)  fyi: stomach is feeling better and she cnacled apt with Dr Lisa Roca and did not resch   Calls today feeling better

## 2013-09-26 ENCOUNTER — Ambulatory Visit (HOSPITAL_BASED_OUTPATIENT_CLINIC_OR_DEPARTMENT_OTHER): Payer: MEDICARE | Admitting: Internal Medicine

## 2013-09-28 ENCOUNTER — Other Ambulatory Visit (HOSPITAL_BASED_OUTPATIENT_CLINIC_OR_DEPARTMENT_OTHER): Payer: Self-pay | Admitting: Internal Medicine

## 2013-09-28 MED ORDER — SUCRALFATE 1 G PO TABS
1.0000 g | ORAL_TABLET | Freq: Four times a day (QID) | ORAL | Status: DC
Start: 2013-09-28 — End: 2013-11-19

## 2013-09-28 NOTE — Telephone Encounter (Signed)
-----   Message from Quincy Simmonds sent at 09/28/2013  2:08 PM EDT -----  Regarding: refill  [place] calling on behalf of patient: Pharmacy    May list multiple medications in this section    Medicine Name: polyethylene glycol [facility]) powder    Documented patient preferred pharmacies:   [facility], [place], [place]  Phone: [phone] Fax: [phone]    Patient's language of care: [place]

## 2013-09-28 NOTE — Telephone Encounter (Signed)
-----   Message from Quincy Simmonds sent at 09/28/2013 10:15 AM EDT -----  Regarding: refill  [facility]    Person calling on behalf of patient: Patient (self)    May list multiple medications in this section    Medicine Name: sucralfate ([place]) 1 GM tablet     Documented patient preferred pharmacies:   [facility], [place], [place]  Phone: [phone] Fax: [phone]    Patient's language of care: [place]

## 2013-09-28 NOTE — Progress Notes (Signed)
PER Patient (self), Cheryl Dixon is a 58 year old female has requested a refill of sucralfate 1 gm.      Last Office Visit: 05-27-13  Last Physical Exam: 05-27-13      Other Med Adult:  Most Recent BP Reading(s)  05/27/13 : 136/91        Cholesterol (mg/dl)   Date  Value    01/04/2010  264*   ----------    LDL DIRECT (mg/dl)   Date  Value    01/04/2010  101*   ----------    HDL (mg/dl)   Date  Value    01/04/2010  38    ----------    No results found for this basename: tg        THYROID SCREEN TSH REFLEX FT4 (uIU/mL)   Date  Value    05/27/2013  2.130    ----------        No results found for this basename: TSH      HEMOGLOBIN A1C (%)   Date  Value    05/27/2013  5.9*   ----------        INR (no units)   Date  Value    02/16/2007  1.0*    07/03/2006  < 1.0*   ----------       Documented patient preferred pharmacies:    [facility], [place], [place] - [place]  Phone: [phone] Fax: [phone]

## 2013-09-28 NOTE — Progress Notes (Signed)
PER Pharmacy, Cheryl Dixon is a 58 year old female has requested a refill of polyethylene powder.      Last Office Visit: 05-27-13  Last Physical Exam: 05-27-13      Other Med Adult:  Most Recent BP Reading(s)  05/27/13 : 136/91        Cholesterol (mg/dl)   Date  Value    01/04/2010  264*   ----------    LDL DIRECT (mg/dl)   Date  Value    01/04/2010  101*   ----------    HDL (mg/dl)   Date  Value    01/04/2010  38    ----------    No results found for this basename: tg        THYROID SCREEN TSH REFLEX FT4 (uIU/mL)   Date  Value    05/27/2013  2.130    ----------        No results found for this basename: TSH      HEMOGLOBIN A1C (%)   Date  Value    05/27/2013  5.9*   ----------        INR (no units)   Date  Value    02/16/2007  1.0*    07/03/2006  < 1.0*   ----------       Documented patient preferred pharmacies:    [facility], [place], [place] - [place]  Phone: [phone] Fax: [phone]

## 2013-09-30 ENCOUNTER — Encounter (HOSPITAL_BASED_OUTPATIENT_CLINIC_OR_DEPARTMENT_OTHER): Payer: Self-pay | Admitting: Gastroenterology

## 2013-09-30 ENCOUNTER — Ambulatory Visit (HOSPITAL_BASED_OUTPATIENT_CLINIC_OR_DEPARTMENT_OTHER): Payer: MEDICARE | Admitting: Gastroenterology

## 2013-09-30 VITALS — BP 141/88 | HR 75 | Temp 98.1°F | Wt 215.0 lb

## 2013-09-30 DIAGNOSIS — R195 Other fecal abnormalities: Secondary | ICD-10-CM

## 2013-09-30 DIAGNOSIS — R1013 Epigastric pain: Secondary | ICD-10-CM | POA: Insufficient documentation

## 2013-09-30 MED ORDER — NA SULFATE-K SULFATE-MG SULF 17.5-3.13-1.6 GM/177ML PO SOLN
1.00 | Freq: Once | ORAL | Status: AC
Start: 2013-09-30 — End: 2013-09-30

## 2013-09-30 NOTE — Progress Notes (Cosign Needed)
Cheryl Pace, MD  [name], [place] [address]    Dear Dr. [name], MD,    I wish to thank you for referring your patient.  ___________________________________  Reason For Consult:   I am asked to see our patient in clinical consultation for evaluation of a chief complaint of Occult blood in stools  (primary encounter diagnosis)  Epigastric pain    ___________________________________    Chief Complaint: Evaluation of this chief complaint led to the following visit diagnoses:   Occult blood in stools  (primary encounter diagnosis)  Epigastric pain    ____________________________________    Office Interview notes regarding Chief complaint:    History of the Present Illness: For about 18 months, the patient reports that she has had nagging epigastric pain and nausea. Initially, she was H. Pylori positive. After triple therapy, she reports that the pain disappeared for a month, but then it came back and has been intermittent since. She also has a feeling of trouble swallowing, that food is getting stuck. She reports that the pain is much better without eating, but is worse every time she eats. She has been taking nexium and omeprazole (switching between the two), but is now on sulcrafate after her negative H.Pylori test. She sleeps with the her head elevated and has a sour taste in her mouth. She also has had heme + stool and has never had a colonoscopy.     She reports that she has been feeling fatigued and having some lightheadedness. She also feels like she is SOB with exertion. Denies BRB, but has seen tarry black stool. Denies diarrhea or constipation. She endorses urinary hesitancy.       ___________________________________  Problem List:  Patient Active Problem List:     Opioid dependence on agonist therapy     TOBACCO USE DISORDER     Fibrocystic breast     Elevated BP     Knee pain     Chronic low back pain     OA (osteoarthritis) of knee     H. pylori infection     Major  depressive disorder, recurrent episode, severe     Occult blood in stools     Prediabetes     Epigastric pain    Past Surgical History:      Past Surgical History    ANES HRNA REPAIR UPR ABD TABDL RPR DIPHRG HRNA      ENDOMETRIAL ABLTJ THERMAL W/O HYSTEROSCOPIC GID      ANES IPER LOWER ABD W/LAPS RAD HYSTERECTOMY      RPR 1ST INGUN HRNA PRETERM INFT [place]       Social History:    Smoking Status: Current Every Day Smoker        Packs/Day: 0.25  Years: 20        Types: Cigarettes    Smokeless Status: Never Used                        Comment: quit 1980-1996, then light until 2003, then              1 ppd since    Alcohol Use: No              Family History:    Family History    Heart Father     Comment: CAD, died age 40    Heart Mother     Comment: CAD    Heart Brother  Comment: CAD, died age 30    Pulmonary Father     Comment: COPD    Cancer - Breast Maternal Aunt     Comment: age 30    Cancer - Breast Maternal Aunt     Comment: age 57     Allergies:  Review of Patient's Allergies indicates:   Darvon                     Meperidine hcl              Comment:Nausea/vomit   Paxil [paroxetine]      Rash   Zoloft [sertraline *    Rash  Current Medications:    Current Outpatient Prescriptions:  polyethylene glycol (GLYCOLAX) powder TAKE 17GM DOSE BY MOUTH EVERY DAY DISSOLVED IN 8OZ. OF LIQUID Disp: 527 g Rfl: 5   sucralfate (CARAFATE) 1 GM tablet Take 1 tablet by mouth 4 (four) times daily. Disp: 120 tablet Rfl: 0   FLUoxetine (PROZAC) 20 MG capsule Take 1 capsule by mouth daily. Disp: 30 capsule Rfl: 2   buPROPion (WELLBUTRIN XL) 150 MG 24 hr tablet Take 1 tablet by mouth every morning. Disp: 30 tablet Rfl: 2   methadone (DOLOPHINE) 10 mg/mL solution Take 58 mg by mouth daily. 52 mg - 09/2010 Disp: 1 mL Rfl: 0   acetaminophen (TYLENOL) 160 MG/5ML suspension Take 20.3 mLs by mouth every 4 (four) hours as needed for Fever or Pain. Replaces all previous scripts Disp: 240 mL Rfl: 1     No current facility-administered  medications for this visit.  Review of Systems:  All systems were otherwise negative except for the GI and other systems documented in the History of the Present Illness.    __________________________________________________  Physical Exam:  The patient is sitting in the examining table and appears to be in no apparent distress.  BP 141/88   Pulse 75   Temp(Src) 98.1 F (36.7 C) (Oral)   Wt 215 lb (97.523 kg)   SpO2 100%   LMP 11/20/1991  HEENT: Normocephalic atraumatic  Oropharynx: MMM  Neck: Supple  Lungs: Scattered Wheezes, diminished BS  Heart: Regular rate and rhythm,    DVV:OHYW, mild epigastric tenderness to deep palpation, no rebound, no guarding  Extremities: trace edema bilaterally   Skin: varicosities on LE  Neuro: Alert and oriented to person , place and time  _____________________________________________      A/P:  Occult blood in stools  (primary encounter diagnosis)  Epigastric pain  Active GI  Problems Addressed During This Visit (Visit Diagnoses):    41 F w/ hx of H/ pylori and recurrent epigastric pain as well as heme positive stools. Patient has never had an endoscopy or screening colonoscopy.    SUMMARY OF THE CLINICAL VISIT:    Procedures/Test ordered: Endoscopy, colonoscopy and H.plyori breath test    Plan: Patient needs both and endoscopy and screening colonoscopy given her symptoms and age. She also would like an H. Pylori breath test, as she is concerned that she still has an active H.pylori infection. She will be scheduled with Anesthesia for her procedures. Patient was also instructed that if her symptoms are improved on a PPI, then she should continue the PPI.    Risks and benefits of the procedures were discussed in detail with the patient, and she appeared to understand.     Patient seen with Dr. Owens Shark, who agrees and helped formulate this plan     Sincerely,    Alphonso  Owens Shark MD, MS Clin EPI    JG:YLUDA Roll, MD  Primary note dictated through E-Scription.  See "Notes" tab in Chart  Review for transcribed note; final note also appears within encounter.    I have spent  minutes in face to face time with this patient/patient proxy of which > 50% was in counseling or coordination of care regarding above issues/Dx.      Patient understands and agrees with the plan

## 2013-09-30 NOTE — Progress Notes (Signed)
Here for evaluation of epigastric pain. States had H Pylori 2 years ago. States symptoms have never subsided.

## 2013-10-04 ENCOUNTER — Telehealth (HOSPITAL_BASED_OUTPATIENT_CLINIC_OR_DEPARTMENT_OTHER): Payer: Self-pay | Admitting: Ambulatory Care

## 2013-10-04 NOTE — Telephone Encounter (Signed)
-----   Message from [place] sent at 10/04/2013  1:19 PM EDT -----  Regarding: Requesting a call back  Contact: [place] [id number], 58 year old, female, Telephone Information:  Home Phone      [phone]  Mobile          [phone]      Patient's Preferred Pharmacy:   [facility], [place], [place] - [place]  Phone: [phone] Fax: [phone]      CONFIRMED TODAY: [name] NUMBER: [phone]    Patient's language of care: English    Patient's PCP: [name], MD    Person calling on behalf of patient: Patient (self)    Calls today Requesting a call back regarding GI  Appointment she had yesterday.

## 2013-10-04 NOTE — Progress Notes (Signed)
Called and spoke with pt  She said she had a GI appt yesterday and was told to call [facility] to set up an appt to be seen for an Endoscopy and a Colonoscopy  She said when she called she was told Dr Owens Shark no longer works there    [place] pt nurse see Dr Owens Shark placed a referral and she will have the referral team look into it and someone will call her back    Message sent to Dorna Mai, please advise

## 2013-10-05 NOTE — Progress Notes (Signed)
Called and spoke with pt  Told her she need to call Dr Saul Fordyce office to find out what he wants her to have done at the [facility] because she mentioned an endoscopy and Colonoscopy but the order states an H-pylori breath test    Pt said she will call his office to get more clarification

## 2013-10-11 ENCOUNTER — Telehealth (HOSPITAL_BASED_OUTPATIENT_CLINIC_OR_DEPARTMENT_OTHER): Payer: Self-pay | Admitting: Ambulatory Care

## 2013-10-11 NOTE — Telephone Encounter (Signed)
-----   Message from [place] sent at 10/11/2013 12:47 PM EDT -----  Regarding: speak to nurse only  Contact: [place] [id number], 58 year old, female, Telephone Information:  Home Phone      [phone]  Mobile          [phone]        Patient's Preferred Pharmacy:     [facility], [place], [place] - [place]  Phone: [phone] Fax: [phone]      CONFIRMED TODAY: [name] NUMBER:  [phone]    Patient's language of care: English    Patient's PCP: [name], MD    Person calling on behalf of patient: Patient (self)    Calls today to speak to nurse only regarding an appointment in [facility]

## 2013-10-11 NOTE — Telephone Encounter (Signed)
-----   Message from [place] sent at 10/11/2013  2:49 PM EDT -----  Regarding: returning [place] [id number], 58 year old, female, Telephone Information:  Home Phone      [phone]      [place] NUMBER: [phone]    Patient's language of care: English    Patient does not need an interpreter.    Patient's PCP: [name], MD    Person calling on behalf of patient: Patient (self)    Calls today Returning phonecall

## 2013-10-11 NOTE — Progress Notes (Signed)
Call returned/placed to pt no answer, left message on VM to call clinic back at [phone]

## 2013-11-02 ENCOUNTER — Telehealth (HOSPITAL_BASED_OUTPATIENT_CLINIC_OR_DEPARTMENT_OTHER): Payer: Self-pay | Admitting: Ambulatory Care

## 2013-11-02 NOTE — Progress Notes (Signed)
Call returned/placed to pt no answer, left message on VM to call clinic back at [phone]

## 2013-11-02 NOTE — Telephone Encounter (Signed)
-----   Message from Quincy Simmonds sent at 11/02/2013  3:02 PM EDT -----  Regarding: Rx and Gertie Fey questions  Cheryl Dixon [id number], 58 year old, female, Telephone Information:  Home Phone      [phone]  Work Phone      Not on file.  Mobile          [phone]  Home Phone      Not on file.    Patient's Preferred Pharmacy:     [facility], [place], [place]  Phone: [phone] Fax: [phone]    CONFIRMED TODAY: Yes    Patient's language of care: English    Patient's PCP: [name], MD    Person calling on behalf of patient: Patient (self)    Calls today Rx and Gastro questions

## 2013-11-02 NOTE — Telephone Encounter (Signed)
-----   Message from Quincy Simmonds sent at 11/02/2013  3:02 PM EDT -----  Regarding: Rx and Gertie Fey questions  Cheryl Dixon [id number], 58 year old, female, Telephone Information:  Home Phone      [phone]  Work Phone      Not on file.  Mobile          [phone]  Home Phone      Not on file.    Patient's Preferred Pharmacy:     [facility], [place], [place]  Phone: [phone] Fax: [phone]    CONFIRMED TODAY: Yes    Patient's language of care: English    Patient's PCP: [name], MD    Person calling on behalf of patient: Patient (self)    Calls today Rx and Gastro questions

## 2013-11-11 NOTE — Progress Notes (Signed)
Depression outreach call placed to pt  Left message on VM to call [facility] [phone]

## 2013-11-17 NOTE — Progress Notes (Signed)
Call returned/placed to pt no answer, left message on VM to call clinic back at [phone]    Pt needs PHQ9,   Care Plan and F/U Appt

## 2013-11-18 NOTE — Progress Notes (Signed)
3rd attempt  Call returned/placed to pt no answer, left message on VM to call clinic back at [phone]    Pt will be coming in tomorrow for abd pain  Edited reason for visit to include OHQ 9 and care plan in the event it can be done when pt is in the office

## 2013-11-19 ENCOUNTER — Ambulatory Visit (HOSPITAL_BASED_OUTPATIENT_CLINIC_OR_DEPARTMENT_OTHER): Payer: MEDICARE | Admitting: Internal Medicine

## 2013-11-19 ENCOUNTER — Encounter (HOSPITAL_BASED_OUTPATIENT_CLINIC_OR_DEPARTMENT_OTHER): Payer: Self-pay | Admitting: Internal Medicine

## 2013-11-19 VITALS — BP 118/87 | HR 75 | Temp 97.5°F | Wt 213.0 lb

## 2013-11-19 DIAGNOSIS — F332 Major depressive disorder, recurrent severe without psychotic features: Secondary | ICD-10-CM

## 2013-11-19 MED ORDER — FLUOXETINE HCL 20 MG PO CAPS
20.0000 mg | ORAL_CAPSULE | Freq: Every day | ORAL | Status: DC
Start: 2013-11-19 — End: 2013-11-23

## 2013-11-19 MED ORDER — ESOMEPRAZOLE MAGNESIUM 40 MG PO CPDR
40.0000 mg | DELAYED_RELEASE_CAPSULE | Freq: Every morning | ORAL | Status: DC
Start: 2013-11-19 — End: 2013-11-23

## 2013-11-19 NOTE — Progress Notes (Signed)
Patient presents with:  Abdominal Pain  abdominal pain - epigastric  reflux  Started 1.5 years ago   Had H pylori  Treated per patient  Told to take PPI x 6 months but only took for a few months  Then symptoms recurred and started on prilosec  Helped for awhile   Then changed nexium but was expensive  sucralafate was last rx which she is taking now   Helped for awhile but then stopped working  Acid coming up into her mouth  Stopped drinking soda  Cut down on TOB  Worse when she drinks milk so trying not to drink that  Afraid to eat so many things  Feeling very depressed about her symptoms and fear of not being able to eat  Symptoms have been similar to past symptoms  Was supposed to see Dr. Lisa Roca earlier this month but was confused abt appt date/time so missed it  Regurgitates food if she bends over  Feels bloated and gas  Has seen GI  Fearful about work up and has not followed up    Feels depressed  No SI  Has her grandchildren and children so would not harm herself  PHQ9 66 and reviewed  Would like refill on prozac  Still has some wellbutrin  Needs PCP f/u    PMH/MEDS/ALL REVIEWED  Physical Examination:  BP 118/87   Pulse 75   Temp(Src) 97.5 F (36.4 C)   Wt 213 lb (96.616 kg)   SpO2 98%   LMP 11/20/1991   GEN: NAD   LUNGS: Clear to auscultation bilaterally  HEART: normal S1, S2 no murmurs/rubs/gallops  ABD: soft, mild epigastric tenderness, non-distended, no guarding or rebound  EXTREM: no c/c/e    A/P  Epigastric abdominal pain, GERD - has seen GI, notes reviewed, next step is EGD and colonoscopy (heme + stool), patient fearful about work up   - counseled patient on diagnosis and treatment of symptoms and signs for concern  - discussed importance of further evaluation by GI including EGD and colonoscopy - patient agrees to arrange this but lost paperwork, would like to have RN help in doing this - note sent to [facility] to see if she can assist patient  - start nexium as per orders  - reviewed foods  to avoid and hand out given to patient about GERD and foods to avoid    Depression - no SI, PHQ9 13, denies med side effects  - reviewed importance of seeing PCP for this, has not been seen since 04/2013  - will refill prozac in the interim so she does not run out  - appts available with PCP next week - she will schedule    F/u next week with PCP or sooner prn  Note to PCP

## 2013-11-21 ENCOUNTER — Encounter (HOSPITAL_BASED_OUTPATIENT_CLINIC_OR_DEPARTMENT_OTHER): Payer: Self-pay | Admitting: Ambulatory Care

## 2013-11-21 NOTE — Progress Notes (Signed)
Message Received: 2 days ago     [name]  [name] Cc: [name]               Seen on Sat  PHQ9 done and 42 - yay, no care plan  She needs to f/u with GI but lost all of her paperwork, would like you - [name] - to help her with this.  Will see PCP next week - see my note for details  FYI Roll  Thanks. Sunday Spillers with pt  Appt booked for f/u with PCP for abd issues and Depression    Number given to pt to call GI to reschedule appt    Pt said she does not have refills on Miralax  5 refills were sent in June  Pt to call pharmacy

## 2013-11-23 ENCOUNTER — Encounter (HOSPITAL_BASED_OUTPATIENT_CLINIC_OR_DEPARTMENT_OTHER): Payer: Self-pay | Admitting: Internal Medicine

## 2013-11-23 ENCOUNTER — Ambulatory Visit (HOSPITAL_BASED_OUTPATIENT_CLINIC_OR_DEPARTMENT_OTHER): Payer: MEDICARE | Admitting: Internal Medicine

## 2013-11-23 VITALS — BP 138/88 | HR 72 | Temp 97.2°F | Ht 62.99 in | Wt 211.0 lb

## 2013-11-23 DIAGNOSIS — R1310 Dysphagia, unspecified: Secondary | ICD-10-CM

## 2013-11-23 MED ORDER — FLUOXETINE HCL 40 MG PO CAPS
40.0000 mg | ORAL_CAPSULE | Freq: Every day | ORAL | Status: DC
Start: 2013-11-23 — End: 2014-02-16

## 2013-11-23 MED ORDER — ESOMEPRAZOLE MAGNESIUM 40 MG PO CPDR
40.0000 mg | DELAYED_RELEASE_CAPSULE | Freq: Every morning | ORAL | Status: DC
Start: 2013-11-23 — End: 2014-03-02

## 2013-11-23 MED ORDER — SUCRALFATE 1 G PO TABS
1.0000 g | ORAL_TABLET | Freq: Four times a day (QID) | ORAL | Status: AC
Start: 2013-11-23 — End: 2013-12-23

## 2013-11-23 MED ORDER — RANITIDINE HCL 150 MG PO TABS
150.00 mg | ORAL_TABLET | Freq: Every evening | ORAL | Status: AC
Start: 2013-11-23 — End: 2014-02-21

## 2013-11-23 MED ORDER — CYCLOBENZAPRINE HCL 5 MG PO TABS
5.0000 mg | ORAL_TABLET | Freq: Three times a day (TID) | ORAL | Status: DC | PRN
Start: 2013-11-23 — End: 2014-04-19

## 2013-11-23 NOTE — Progress Notes (Signed)
58 year old female seen in follow up:  Stomach pain  Has been switching off between omeprazole and sucralfate and nexium  Has appt for EGD and colonoscopy 9/17.  Main problem is burning pain in chest  Sleeps sitting up to minimize heartburn symptoms   Avoiding spicy foods  Not eating after 6 pm  Few cigarettes -- less than 1 pack per week  Not eating chocolate  Drinking soy milk  No pepsi  No caffeine  Sometimes has sensation of food getting stuck  Sometimes has early satiety    Back strain  Pulled it doing laundry about 10 days ago  Stiffness when getting up in morning  Not radiating to leg    PRIMARY CARE DEPRESSION FOLLOW-UP  Interpreter needed: No  PHQ-9 Score Today:   PHQ-9 TOTAL SCORE 11/23/2013 11/19/2013 08/10/2013   Doc FlowSheet Total Score 22 13 17    subjectively symptoms are much improved since she started fluoxetine -- current depressive symptoms are primarily related to pain  Taking fluoxetine 20 mg and wellbutrin    Patient is reporting depressed mood, irritability, sleep disturbance, little interest, decreased motivation, guilt and poor appetite  Symptoms have improved since last visit    The patient was prescribed medications and is taking them exactly as directed.  The patient is not having any side effects from the medication.  The patient is not in psychotherapy    Suicide Risk:  Low risk = No current thoughts, no major risk factors  Homicide Risk:   Low risk = No current thoughts, no major risk factors    Exam/ROS: The patient is alert verbal and engaged.  Speech is clear and not tangential. Mood is depressed with constricted affect.  There is no suicidal ideation or overt psychosis.  Abdomen reveals mild tenderness in the epigastric area, without rebound, guarding, mass or organomegaly. Abdomen is soft and bowel sounds are normal.  BACK:  Lumbosacral spine area reveals no paraspinous tenderness, no mass.  Limited, painful lumbosacral range of motion is noted. Straight leg raise  elicits only low back pain at 60 degrees on both sides. DTR's, motor strength and sensation normal.    Assessment:  296.32 Major Depressive Disorder, Recurrent, Moderate    In consultaion with patient, treatment plan agreed on:   Change dose/freq: increase fluoxetine to 40 mg  - recheck response in 2-3 weeks,     Epigastric pain, GERD, possible esophagitis  EGD scheduled for September  Using several different meds intermittently rather than consistently  Discussed consistent use of PPI (am) H2 blocker (pm), timed with sucralfate qid so as not to interfere with absorption.  Pt will try this regimen    Lumbar srain  Flexeril  Follow up if symptoms worsening or not improving as expected   .  We discussed the patients current medications. The patient expressed understanding and no barriers to adherence were identified.  1. The patient indicates understanding of these issues and agrees with the plan.  Brief care plan is updated and reviewed with the patient.   2. The patient is given an After Visit Summary sheet that lists all medications with directions, allergies, orders placed during this encounter, and follow-up instructions.   3. I reviewed the patient's medical information and medical history   4. I reconciled the patient's medication list and prepared and supplied needed refills.   5. I have reviewed the past medical, family, and social history sections including the medications and allergies.    [name], MD

## 2013-12-20 ENCOUNTER — Telehealth (HOSPITAL_BASED_OUTPATIENT_CLINIC_OR_DEPARTMENT_OTHER): Payer: Self-pay | Admitting: Ambulatory Care

## 2013-12-20 NOTE — Progress Notes (Addendum)
Called and spoke with pt  She said she is doing great  She said since the increase dose of Prozac she is getting more rest, more sociable and have more energy    Pt said she will be going to get her xray sometime this wk     She said she has also been monitoring what she eats and thinks she might be lactose intolerant  She said each time she drink milk she gets the pain in her chest along with nausea and sometimes vomiting    Pt said she also notice sometimes when she wake to urinate she can sit on the toilet for a while and then have to get up and walk to start a stream  She denies any back pain, burning or pain with urination  She said will call to make an appt to be seen if the urine issue persist and she will see if Dr Owens Shark can eval her to see if she is lactose intolerant    PHQ 9 done and score has improved 50% to 11 from 22  Will not change acute status at this time because she has other issues and this could change

## 2013-12-22 ENCOUNTER — Telehealth (HOSPITAL_BASED_OUTPATIENT_CLINIC_OR_DEPARTMENT_OTHER): Payer: Self-pay | Admitting: Registered Nurse

## 2013-12-22 ENCOUNTER — Telehealth (HOSPITAL_BASED_OUTPATIENT_CLINIC_OR_DEPARTMENT_OTHER): Payer: Self-pay | Admitting: Ambulatory Care

## 2013-12-22 NOTE — Telephone Encounter (Signed)
-----   Message from Fayette Pho sent at 12/22/2013  3:33 PM EDT -----  Regarding: Results  Contact: [phone]  Cheryl Dixon [id number], 58 year old, female, Telephone Information:  Home Phone      [phone]  Work Phone      Not on file.  Mobile          [phone]  Home Phone      Not on file.    Rod Can NUMBER: [phone]    Patient's language of care: English    Patient's PCP: [name], MD    Person calling on behalf of patient: Patient (self)    Calls today Results

## 2013-12-22 NOTE — Telephone Encounter (Signed)
-----   Message from [facility] sent at 12/22/2013  3:14 PM EDT -----  Regarding: Utility Letter  Contact: [phone]  Cheryl Dixon [id number], 58 year old, female, Telephone Information:  Home Phone      [phone]  Work Phone      Not on file.  Mobile          [phone]  Home Phone      Not on file.    Rod Can NUMBER: [phone]    Patient's language of care: English    Patient's PCP: [name], MD    Person calling on behalf of patient: Patient (self)    Calls today requesting a letter for: Utilities   Name of Utility: [employer])  Name on Utility Account: Cheryl Dixon  Utility Account Number: [id number]  Fax Number to send Letter: [phone]

## 2013-12-22 NOTE — Telephone Encounter (Signed)
-----   Message from Fayette Pho sent at 12/22/2013  3:20 PM EDT -----  Regarding: requesting a return call regarding Varicose veins    Contact: [place] [id number], 58 year old, female, Telephone Information:  Home Phone      [phone]  Work Phone      Not on file.  Mobile          [phone]  Home Phone      Not on file.    Rod Can NUMBER: [phone]    Patient's language of care: English    Patient does not need an interpreter.    Patient's PCP: [name], MD    Person calling on behalf of patient: Patient (self)    Calls today requesting a return call regarding Varicose veins

## 2013-12-22 NOTE — Progress Notes (Signed)
Letter signed and given to [facility] to fax

## 2013-12-22 NOTE — Progress Notes (Signed)
Pt requesting a Utility Letter    Letter written and left for Arleigh to sign

## 2013-12-22 NOTE — Progress Notes (Addendum)
1. Pt requesting to have vein ablation on the L leg.   Rquesing assistance with scheduling procedure. Pt aware PCP out of office.   Had vein ablation on the R leg years ago. Has been seen many times for varicose veins on L leg    No redness, pain or warmth on lower left extremity. There is some swelling in ankle, resolves with elevation.     2. Requesting appt after on or after GI appt 01/12/14. No appts available. Only same day or three day slots.      Pls advise.     SO

## 2013-12-23 NOTE — Progress Notes (Signed)
I do not see any history of varicose veins in chart. She should come in for appt for evaluation, I cannot approve a specific time slot for PCP - I think it is ok to schedule with someone else if she would like. If she wants to see PCP would have to wait until next opening

## 2013-12-26 NOTE — Progress Notes (Signed)
Not able to reach x2, phone number not accepting calls.  SO

## 2013-12-26 NOTE — Progress Notes (Signed)
OK to book patient with me at end of session (after same-day slot) on 9/18, 9/24, 9/25.

## 2013-12-27 NOTE — Progress Notes (Signed)
Pt scheduled for 01/19/14 at 1150am.    SO

## 2014-01-12 ENCOUNTER — Ambulatory Visit (HOSPITAL_BASED_OUTPATIENT_CLINIC_OR_DEPARTMENT_OTHER): Payer: MEDICARE | Admitting: Gastroenterology

## 2014-01-18 ENCOUNTER — Telehealth (HOSPITAL_BASED_OUTPATIENT_CLINIC_OR_DEPARTMENT_OTHER): Payer: Self-pay | Admitting: Mental Health

## 2014-01-18 NOTE — Progress Notes (Signed)
Cheryl Dixon, 01/18/2014, 11:13 AM    Called patient to complete new PHQ9, left voicemail

## 2014-01-19 ENCOUNTER — Ambulatory Visit (HOSPITAL_BASED_OUTPATIENT_CLINIC_OR_DEPARTMENT_OTHER): Payer: MEDICARE | Admitting: Internal Medicine

## 2014-01-19 NOTE — Progress Notes (Signed)
This encounter was opened in error.  Please disregard.

## 2014-01-27 ENCOUNTER — Ambulatory Visit (HOSPITAL_BASED_OUTPATIENT_CLINIC_OR_DEPARTMENT_OTHER): Payer: MEDICARE | Admitting: Internal Medicine

## 2014-02-14 ENCOUNTER — Telehealth (HOSPITAL_BASED_OUTPATIENT_CLINIC_OR_DEPARTMENT_OTHER): Payer: Self-pay | Admitting: Registered Nurse

## 2014-02-14 ENCOUNTER — Ambulatory Visit (HOSPITAL_BASED_OUTPATIENT_CLINIC_OR_DEPARTMENT_OTHER): Payer: MEDICARE | Admitting: Internal Medicine

## 2014-02-14 NOTE — Progress Notes (Signed)
Returned call to pt  Pt states fell about 1 month ago  Hurt for about 1 week  Now c/o pain in neck and shoulder  States forgetful at times  No weakness, no numbness, no tingling  Vague S/s  Oriented, speaking clearly in full sentences    Provider notified    Returned call to pt  Left message to keep appt in clinic today

## 2014-02-14 NOTE — Progress Notes (Signed)
Pt scheduled for head pain due to fall  Called to triage pt  Left message to call clinic

## 2014-02-15 ENCOUNTER — Telehealth (HOSPITAL_BASED_OUTPATIENT_CLINIC_OR_DEPARTMENT_OTHER): Payer: Self-pay | Admitting: Ambulatory Care

## 2014-02-15 NOTE — Progress Notes (Signed)
Called and spoke to pt  She said she fell off her folding couch a wk ago and hit her head on the floor    She denies Dizziness, N/D but states she has been having HA and she is not sure what she can for migraines    Pt has issues with her stomach and has not taken ibuprofen  Pt said since she stopped drinking milk she notices her stomach issues has calmed a lot.  She said she occasionally will have pain in the abd in the am when she wakes but it is mild in comparison.    Offered pt an appt for today and she said she is watching her grandchildren and will like appt for tomorrow  appt booked for 230 pm with Deidre Ala

## 2014-02-15 NOTE — Telephone Encounter (Signed)
-----   Message from Bronwen Betters sent at 02/15/2014 10:49 AM EDT -----  Regarding: wants to speak to RN, bumped head a few weeks back and would like to knopw what to do in the meantime      Cheryl Dixon [id number], 58 year old, female, Telephone Information:  Home Phone      [phone]  Work Phone      Not on file.  Mobile          [phone]  Home Phone      Not on file.      Patient's Preferred Pharmacy:     [facility], [place], [place]  Phone: [phone] Fax: [phone]          Rod Can NUMBER: [phone]      Patient's language of care: English    Patient does not need an interpreter.    Patient's PCP: [name], MD (General)    Person calling on behalf of patient: Patient (self)    Calls today to speak to nurse only.  with questions and concerns. Re: her hitting her head a few weeks back

## 2014-02-16 ENCOUNTER — Ambulatory Visit (HOSPITAL_BASED_OUTPATIENT_CLINIC_OR_DEPARTMENT_OTHER): Payer: MEDICARE | Admitting: Physician Assistant

## 2014-02-16 ENCOUNTER — Encounter (HOSPITAL_BASED_OUTPATIENT_CLINIC_OR_DEPARTMENT_OTHER): Payer: Self-pay | Admitting: Physician Assistant

## 2014-02-16 VITALS — BP 124/84 | HR 72 | Temp 96.9°F | Wt 217.6 lb

## 2014-02-16 DIAGNOSIS — R6889 Other general symptoms and signs: Secondary | ICD-10-CM

## 2014-02-16 DIAGNOSIS — F332 Major depressive disorder, recurrent severe without psychotic features: Secondary | ICD-10-CM

## 2014-02-16 DIAGNOSIS — S0990XA Unspecified injury of head, initial encounter: Secondary | ICD-10-CM

## 2014-02-16 LAB — URINE DIP (POINT OF CARE)
BILIRUBIN, URINE: NEGATIVE
GLUCOSE, URINE: NEGATIVE mg/dl
NITRITE, URINE: NEGATIVE
PH URINE: 6 (ref 5.0–8.0)
PROTEIN, URINE: NEGATIVE mg/dl (ref 0–15)
SPECIFIC GRAVITY URINE: 1.015 (ref 1.003–1.030)
UROBILINOGEN URINE: 0.2 mg/dl (ref 0.2–1.0)

## 2014-02-16 MED ORDER — FLUOXETINE HCL 40 MG PO CAPS
40.0000 mg | ORAL_CAPSULE | Freq: Every day | ORAL | Status: DC
Start: 2014-02-16 — End: 2014-07-18

## 2014-02-16 NOTE — Progress Notes (Signed)
Cheryl Dixon is a 58 year old female patient of Cheryl Pace, MD (General) presenting with:     Head injury   Patient reports that 3 weeks she fell off her sofa bed while standing to reach for something. She hit the top of her head on the hardwood floor. Patient reports she was able to get up without any loss of consciousness. She reports that the top of her head hurt for about a week and was sore to touch. Then pain eventually resolved. She was asymptomatic until a week when she notices that she has been having some issues with memory. She reports getting forgetful at times   "forgetting keys in door" . She felt off balance on few occasions.  She has off and on right temporal headache that last few seconds. She denies any focal weakness, numbness, change in vision  nausea , vomiting.   Denies increase somnolence     Patient wants to check for UTI because she knows people may be confused with UTI. She denies urinary symptoms    Depression  Patient currently on Prozac 40 mg   Reports feeling much better  PHQ 2: 2  PHQ 9: 10     Patient Active Problem List:     Opioid dependence on agonist therapy     TOBACCO USE DISORDER     Fibrocystic breast     Elevated BP     Knee pain     Chronic low back pain     OA (osteoarthritis) of knee     H. pylori infection     Major depressive disorder, recurrent episode, severe     Occult blood in stools     Prediabetes     Epigastric pain      Current Outpatient Prescriptions on File Prior to Visit:  esomeprazole (NEXIUM) 40 MG capsule Take 1 capsule by mouth every morning before breakfast. Disp: 30 capsule Rfl: 1   ranitidine (ZANTAC) 150 MG tablet Take 1 tablet by mouth nightly. Disp: 30 tablet Rfl: 2   FLUoxetine (PROZAC) 40 MG capsule Take 1 capsule by mouth daily. Disp: 90 capsule Rfl: 0   sucralfate (CARAFATE) 1 GM tablet Take 1 tablet by mouth 4 (four) times daily. Disp: 120 tablet Rfl: 0   polyethylene glycol (GLYCOLAX) powder TAKE 17GM DOSE BY MOUTH EVERY DAY DISSOLVED IN 8OZ.  OF LIQUID Disp: 527 g Rfl: 5   [EXPIRED] acetaminophen (TYLENOL) 160 MG/5ML suspension Take 20.3 mLs by mouth every 4 (four) hours as needed for Fever or Pain. Replaces all previous scripts Disp: 240 mL Rfl: 1   buPROPion (WELLBUTRIN XL) 150 MG 24 hr tablet Take 1 tablet by mouth every morning. Disp: 30 tablet Rfl: 2   methadone (DOLOPHINE) 10 mg/mL solution Take 58 mg by mouth daily. 52 mg - 09/2010 Disp: 1 mL Rfl: 0     No current facility-administered medications on file prior to visit.       Review of Systems   Eyes: Negative for blurred vision and pain.   Gastrointestinal: Negative for nausea and vomiting.   Neurological: Positive for headaches. Negative for tingling, sensory change, speech change, focal weakness and loss of consciousness.   Psychiatric/Behavioral: Negative for suicidal ideas.     OBJECTIVE  BP 124/92 mmHg   Pulse 72   Temp(Src) 96.9 F (36.1 C) (Temporal)   Wt 98.703 kg (217 lb 9.6 oz)   SpO2 95%   LMP 11/20/1991    Physical Exam   Constitutional: No  distress.   Eyes: Pupils are equal, round, and reactive to light.   Neck: Normal range of motion. Neck supple.   Cardiovascular: Normal rate and regular rhythm.    Pulmonary/Chest: Effort normal and breath sounds normal. No respiratory distress. She has no wheezes.   Abdominal: Soft. Bowel sounds are normal. She exhibits no distension. There is no tenderness.   Neurological:   alert and oriented x3, CNII-XII intact, Motor 5/5 bilaterally, sensation intact bilaterally, normal gait, normal heel/toe/tandem gait, rapid alternating movement intact, finger-nose-finger intact, negative Rhomberg, DTR's 2+ and symmetric bilaterally    Psychiatric: Her mood appears anxious.       ASSESSMENT/PLAN:    (S09.90XA) Head injury, initial encounter  (primary encounter diagnosis)  (R68.89) Forgetfulness  Comment: [name] fell and hit head on hardwood floor 3 weeks ago. She had some pain after which resolved after few days. A week later, she noticed that she has  been somewhat forgetful. Patient has an unremarkable neuro exam today. I do not suspect any type of hemorrhage from fall. She might possibly be having some mild post-concussion symptoms. Patient appears to be anxious on exam, and as I reassured patient she did eventually admit that she might be "overreacting". She insisted on checking urine for UTI. Urine dip showed moderate leuks, but will get culture.   Plan: patient reassured. I will send urine for culture. I have asked patient to follow up in 1-2 weeks to reassess or sooner if she is having worsening symptoms.       (F33.2) Major depressive disorder, recurrent, severe without psychotic features  Comment: patient reports improved mood and PHQ 9 also reflects improvement. More than 50% decrease in PHQ 9 score in the last 3 months.   Plan: continue current regimen and follow up in a month with PCP         1. The patient indicates understanding of these issues and agrees with the plan.  2.  The patient is given an After Visit Summary sheet that lists all of their medications with directions, their allergies, orders placed during this encounter, immunization dates, and follow- up instructions.  3. I reviewed the patient's medical information and medical history   4.  I reconciled the patient's medication list and prepared and supplied needed refills.  5.  I have reviewed the past medical, family, and social history sections including the medications and allergies listed in the above medical record    Electronically signed by: Celesta Gentile, PA-C, 02/17/2014, 1:54 PM    This note is electronically signed in the electronic medical record.

## 2014-02-17 ENCOUNTER — Telehealth (HOSPITAL_BASED_OUTPATIENT_CLINIC_OR_DEPARTMENT_OTHER): Payer: Self-pay

## 2014-02-17 LAB — URINE CULTURE: URINE CULTURE/COLONY COUNT: NO GROWTH

## 2014-02-17 NOTE — Progress Notes (Signed)
Called patient, advised her urine culture is not back yet. She reports she had a missed call from here today, advised her I was not sure who may have called her. Advised her I would let provider know she is waiting for results.

## 2014-02-17 NOTE — Telephone Encounter (Signed)
-----   Message from [facility] sent at 02/17/2014  4:07 PM EDT -----  Regarding: lab results  Contact: [place] [phone], 58 year old, female    Calls today:  Test Results  Test Results Request from Patient    What test result(s) is the patient requesting? Lab results  Date testing was done 02/16/14  Who ordered the test(s) Beaulah Dinning  Mychart status reviewed No  Patient offered mychart Yes  Mychart activated No    Person calling on behalf of patient: Patient (self)    CALL BACK NUMBER: [phone]    Patient's language of care: English    Patient does not need an interpreter.    Patient's PCP: [name], MD (General)

## 2014-02-20 NOTE — Progress Notes (Signed)
Tried to call patient, no answer, left message on private voicemail that urine was negative, call back with any continued sxs.    Component      Latest Ref Rng 02/16/2014   URINE CULTURE/COLONY COUNT       NO GROWTH

## 2014-02-21 ENCOUNTER — Ambulatory Visit (HOSPITAL_BASED_OUTPATIENT_CLINIC_OR_DEPARTMENT_OTHER): Payer: MEDICARE | Admitting: Physician Assistant

## 2014-02-23 ENCOUNTER — Ambulatory Visit (HOSPITAL_BASED_OUTPATIENT_CLINIC_OR_DEPARTMENT_OTHER): Payer: MEDICARE | Admitting: Physician Assistant

## 2014-03-02 ENCOUNTER — Other Ambulatory Visit (HOSPITAL_BASED_OUTPATIENT_CLINIC_OR_DEPARTMENT_OTHER): Payer: Self-pay | Admitting: Internal Medicine

## 2014-03-02 NOTE — Progress Notes (Signed)
PER Pharmacy, Cheryl Dixon is a 58 year old female has requested a refill of ESOMEPRAZOLE MAGNESIUM 40MG  DR CAPSULE.      Last Office Visit: 02/16/2014  Last Physical Exam: 05/27/2013      Other Med Adult:  Most Recent BP Reading(s)  02/16/14 : 124/84        CHOLESTEROL (mg/dl)   Date Value   01/04/2010 264*   ----------    LOW DENSITY LIPOPROTEIN DIRECT (mg/dl)   Date Value   01/04/2010 101*   ----------    HIGH DENSITY LIPOPROTEIN (mg/dl)   Date Value   01/04/2010 38   ----------  No results found for: TG        THYROID SCREEN TSH REFLEX FT4 (uIU/mL)   Date Value   05/27/2013 2.130   ----------      No results found for: TSH      HEMOGLOBIN A1C (%)   Date Value   05/27/2013 5.9*   ----------        INR (no units)   Date Value   02/16/2007 1.0*   07/03/2006 < 1.0*   ----------       Documented patient preferred pharmacies:    [facility], [place], [place]  Phone: [phone] Fax: [phone]

## 2014-04-06 ENCOUNTER — Other Ambulatory Visit (HOSPITAL_COMMUNITY): Payer: Self-pay

## 2014-04-07 ENCOUNTER — Encounter (HOSPITAL_COMMUNITY)
Admission: RE | Admit: 2014-04-07 | Discharge: 2014-04-07 | Disposition: A | Discharge status: Home / Self Care | Payer: 59 | Source: Ambulatory Visit | Attending: Rheumatology | Admitting: Rheumatology

## 2014-04-07 DIAGNOSIS — M069 Rheumatoid arthritis, unspecified: Secondary | ICD-10-CM | POA: Diagnosis present

## 2014-04-07 LAB — DIFFERENTIAL
Basophils Absolute: 0 10*3/uL (ref 0.0–0.1)
Basophils Relative: 0 % (ref 0–1)
Eosinophils Absolute: 0.2 10*3/uL (ref 0.0–0.7)
Eosinophils Relative: 2 % (ref 0–5)
Lymphocytes Relative: 27 % (ref 12–46)
Lymphs Abs: 2.4 10*3/uL (ref 0.7–4.0)
Monocytes Absolute: 0.7 10*3/uL (ref 0.1–1.0)
Monocytes Relative: 8 % (ref 3–12)
Neutro Abs: 5.7 10*3/uL (ref 1.7–7.7)
Neutrophils Relative %: 63 % (ref 43–77)

## 2014-04-07 LAB — CBC
HCT: 38.8 % (ref 36.0–46.0)
Hemoglobin: 12.2 g/dL (ref 12.0–15.0)
MCH: 25.4 pg — ABNORMAL LOW (ref 26.0–34.0)
MCHC: 31.4 g/dL (ref 30.0–36.0)
MCV: 80.7 fL (ref 78.0–100.0)
Platelets: 261 10*3/uL (ref 150–400)
RBC: 4.81 MIL/uL (ref 3.87–5.11)
RDW: 15.3 % (ref 11.5–15.5)
WBC: 9 10*3/uL (ref 4.0–10.5)

## 2014-04-07 LAB — COMPREHENSIVE METABOLIC PANEL
ALT: 22 U/L (ref 0–35)
AST: 22 U/L (ref 0–37)
Albumin: 3.3 g/dL — ABNORMAL LOW (ref 3.5–5.2)
Alkaline Phosphatase: 76 U/L (ref 39–117)
Anion gap: 12 (ref 5–15)
BUN: 17 mg/dL (ref 6–23)
CO2: 29 mEq/L (ref 19–32)
Calcium: 8.8 mg/dL (ref 8.4–10.5)
Chloride: 99 mEq/L (ref 96–112)
Creatinine, Ser: 0.95 mg/dL (ref 0.50–1.10)
GFR calc Af Amer: 76 mL/min — ABNORMAL LOW (ref >90)
GFR calc non Af Amer: 66 mL/min — ABNORMAL LOW (ref >90)
Glucose, Bld: 89 mg/dL (ref 70–99)
Potassium: 3.9 mEq/L (ref 3.7–5.3)
Sodium: 140 mEq/L (ref 137–147)
Total Bilirubin: 0.4 mg/dL (ref 0.3–1.2)
Total Protein: 7.7 g/dL (ref 6.0–8.3)

## 2014-04-07 MED ORDER — ACETAMINOPHEN 325 MG PO TABS: 325.0000 mg | ORAL_TABLET | Freq: Once | ORAL | Status: DC

## 2014-04-07 MED ORDER — SODIUM CHLORIDE 0.9 % IV SOLN
INTRAVENOUS | Status: DC
  Administered 2014-04-07: 250 mL via INTRAVENOUS

## 2014-04-07 MED ORDER — DIPHENHYDRAMINE HCL 25 MG PO TABS
25.0000 mg | ORAL_TABLET | Freq: Once | ORAL | Status: DC
  Filled 2014-04-07: qty 1

## 2014-04-07 MED ORDER — SODIUM CHLORIDE 0.9 % IV SOLN
750.0000 mg | INTRAVENOUS | Status: DC
  Administered 2014-04-07: 750 mg via INTRAVENOUS
  Filled 2014-04-07: qty 30

## 2014-04-14 ENCOUNTER — Other Ambulatory Visit (HOSPITAL_BASED_OUTPATIENT_CLINIC_OR_DEPARTMENT_OTHER): Payer: Self-pay

## 2014-04-14 ENCOUNTER — Telehealth (HOSPITAL_BASED_OUTPATIENT_CLINIC_OR_DEPARTMENT_OTHER): Payer: Self-pay | Admitting: Ambulatory Care

## 2014-04-14 NOTE — Progress Notes (Signed)
Depression outreach call placed to pt  Left message on VM to call [facility] [phone]

## 2014-04-14 NOTE — Telephone Encounter (Signed)
LM and Mail letter for pt to reach out office . Due for Mammogram  Cheryl Dixon, [place], 04/14/2014, 9:58 AM

## 2014-04-18 NOTE — Progress Notes (Signed)
Message left on v/m for pt to remind her of appt with Dr. Broadus John tomorrow at 2:10 PM, to call back if any question.

## 2014-04-18 NOTE — Telephone Encounter (Signed)
-----   Message from [place] sent at 04/18/2014  3:01 PM EST -----  Regarding: Returning phone call  Contact: [phone]      [name] [id number], 58 year old, female, Telephone Information:  Home Phone      [phone].  Mobile          [phone]  Home Phone      Not on file.      Patient's Preferred Pharmacy:     [facility], [place], [place]  Phone: [phone] Fax: [phone]      CONFIRMED TODAY: [name] NUMBER:  [phone]    Patient's language of care: English    Patient's PCP: [name], MD    Person calling on behalf of patient: Patient (self)    Calls today Returning phone call

## 2014-04-18 NOTE — Telephone Encounter (Signed)
-----   Message from [place] sent at 04/18/2014 12:42 PM EST -----  Regarding: Returning phone call  Contact: [phone]      Laurna Shetley [id number], 58 year old, female, Telephone Information:  Home Phone      [phone]  Work Phone      Not on file.  Mobile          [phone]  Home Phone      Not on file.      Patient's Preferred Pharmacy:     [facility], [place], [place]  Phone: [phone] Fax: [phone]      CONFIRMED TODAY: [name] NUMBER: [phone]    Patient's language of care: English    Patient's PCP: [name], MD    Person calling on behalf of patient: Patient (self)    Calls today Returning phone call

## 2014-04-18 NOTE — Progress Notes (Signed)
2nd outreach  Call returned/placed to pt no answer, left message on VM to call clinic back at [phone]

## 2014-04-19 ENCOUNTER — Encounter (HOSPITAL_BASED_OUTPATIENT_CLINIC_OR_DEPARTMENT_OTHER): Payer: Self-pay | Admitting: Internal Medicine

## 2014-04-19 ENCOUNTER — Ambulatory Visit (HOSPITAL_BASED_OUTPATIENT_CLINIC_OR_DEPARTMENT_OTHER): Payer: MEDICARE | Admitting: Physician Assistant

## 2014-04-19 ENCOUNTER — Ambulatory Visit (HOSPITAL_BASED_OUTPATIENT_CLINIC_OR_DEPARTMENT_OTHER): Payer: MEDICARE | Admitting: Internal Medicine

## 2014-04-19 VITALS — BP 107/79 | HR 79 | Wt 215.2 lb

## 2014-04-19 DIAGNOSIS — E669 Obesity, unspecified: Secondary | ICD-10-CM

## 2014-04-19 DIAGNOSIS — S139XXA Sprain of joints and ligaments of unspecified parts of neck, initial encounter: Secondary | ICD-10-CM

## 2014-04-19 DIAGNOSIS — Z720 Tobacco use: Secondary | ICD-10-CM

## 2014-04-19 DIAGNOSIS — F332 Major depressive disorder, recurrent severe without psychotic features: Secondary | ICD-10-CM

## 2014-04-19 DIAGNOSIS — R195 Other fecal abnormalities: Secondary | ICD-10-CM

## 2014-04-19 DIAGNOSIS — F17200 Nicotine dependence, unspecified, uncomplicated: Secondary | ICD-10-CM

## 2014-04-19 MED ORDER — CYCLOBENZAPRINE HCL 5 MG PO TABS
5.0000 mg | ORAL_TABLET | Freq: Every evening | ORAL | Status: DC | PRN
Start: 2014-04-19 — End: 2014-05-11

## 2014-04-19 MED ORDER — ACETAMINOPHEN 500 MG PO TABS
500.00 mg | ORAL_TABLET | Freq: Four times a day (QID) | ORAL | Status: AC | PRN
Start: 2014-04-19 — End: 2014-05-03

## 2014-04-19 NOTE — Progress Notes (Signed)
Cheryl Dixon is a 58 year old female here for stiff neck    Patient Active Problem List:     Opioid dependence on agonist therapy     TOBACCO USE DISORDER     Fibrocystic breast     Elevated BP     Knee pain     Chronic low back pain     OA (osteoarthritis) of knee     H. pylori infection     Major depressive disorder, recurrent episode, severe     Occult blood in stools     Prediabetes     Epigastric pain    Moved differently when playing with kids last Monday  Pain immediately up right side of neck  Now also other parts of neck and jaw  No fever, hand/arm numbness/tingling/weakness  Motrin did not help much  Heat helps  Had muscle relaxers from before (flexeril)- helped somewhat   Worse with cold, laying in bed at night    Most Recent Weight Reading(s)  04/19/14 : 97.614 kg (215 lb 3.2 oz)  02/16/14 : 98.703 kg (217 lb 9.6 oz)  11/23/13 : 95.709 kg (211 lb)  11/19/13 : 96.616 kg (213 lb)  09/30/13 : 97.523 kg (215 lb)    +ifob - due to reschedule colonoscopy with GI     Tobacco - continued use, cutting down, 2 cig/d now    Depression - taking prozac, doing well   PHQ9 score 3 (down from 10)  No SI    Reports low risk for HIV  Declines tdap and flu shot    PE:  BP 107/79 mmHg   Pulse 79   Wt 97.614 kg (215 lb 3.2 oz)   SpO2 97%   LMP 11/20/1991  Estimated body mass index is 38.13 kg/(m^2) as calculated from the following:    Height as of 11/23/13: 5' 2.99" (1.6 m).    Weight as of this encounter: 97.614 kg (215 lb 3.2 oz).  Gen - Obese female upright in chair in NAD  HEENT - MMM  Neck - FROM, no midline TTP.  +Right lateral TTP. Negative Spurling  CV - RRR, no m/r/g  Resp - CTAB  Neuro - A+Ox3, sensation intact to light touch over BUE, grip strength 5/5 bilaterally  Psych - Appropriate    A/P:  (S13.9XXA) Sprain of neck, initial encounter  (primary encounter diagnosis)  Comment: Likely muscular neck strain, no signs of bony or neurologic involvement, will add tylenol, continue flexeril, encourage heat and  stretching  Plan: acetaminophen (TYLENOL) 500 MG tablet,         DISCONTINUED: cyclobenzaprine (FLEXERIL) 5 MG         Tablet  If not improving, consider PT    (R19.5) Occult blood in stools  Comment: Discussed  Plan: Contact info given for GI - encouraged to reschedule    (Z72.0) Tobacco use disorder  Comment: Contemplative, cutting down  Plan: Encouraged complete cessation    (E66.9) Obesity (BMI 30-39.9)  Comment: By BMI  Plan: Discussed portion control, healthy food choice, exercise    (F33.2) Major depressive disorder, recurrent, severe without psychotic features  Comment: Improved by patient report and PHQ9  Plan: Continue prozac

## 2014-04-19 NOTE — Patient Instructions (Signed)
Call Dr Owens Shark to reschedule endocsopy/colonoscopy/breath test [facility]  [place] [address]  Dept Phone: [phone]                                Dept Fax: [phone]

## 2014-05-04 ENCOUNTER — Other Ambulatory Visit (HOSPITAL_COMMUNITY): Payer: Self-pay | Admitting: *Deleted

## 2014-05-05 ENCOUNTER — Encounter (HOSPITAL_COMMUNITY)
Admission: RE | Admit: 2014-05-05 | Discharge: 2014-05-05 | Disposition: A | Discharge status: Home / Self Care | Payer: 59 | Source: Ambulatory Visit | Attending: Rheumatology | Admitting: Rheumatology

## 2014-05-05 DIAGNOSIS — M069 Rheumatoid arthritis, unspecified: Secondary | ICD-10-CM | POA: Insufficient documentation

## 2014-05-08 ENCOUNTER — Other Ambulatory Visit (HOSPITAL_BASED_OUTPATIENT_CLINIC_OR_DEPARTMENT_OTHER): Payer: Self-pay | Admitting: Psychiatry

## 2014-05-08 ENCOUNTER — Telehealth (HOSPITAL_BASED_OUTPATIENT_CLINIC_OR_DEPARTMENT_OTHER): Payer: Self-pay | Admitting: Mental Health

## 2014-05-08 MED ORDER — FLUOXETINE HCL 20 MG PO CAPS
20.0000 mg | ORAL_CAPSULE | Freq: Every day | ORAL | Status: DC
Start: 2014-05-08 — End: 2014-07-16

## 2014-05-08 NOTE — Progress Notes (Signed)
Cheryl Dixon, 05/08/2014, 11:28 AM  Psychosomatic Medicine Service: Systematic Case Review    Clinical question:  Depression    Care partner: Theadora Rama      Brief:  Patient is a 59 year old separated female with Major Depressive Disorder, Opioid Dependence  Language: english  Current behavioral health care team: [name] MD  In [facility] complex care? no  Past [facility] psychiatric notes: yes 05/02/2013    Most recent PHQ-9  PHQ-9 TOTAL SCORE 04/19/2014 02/16/2014 12/20/2013   Doc FlowSheet Total Score 3 10 11      Current psychiatric treatment:FLuoxetine, Bupropion  Past medication trials and results:Clonazepam, Trazodone, Sertraline, Amitriptyline, Fluoxetine, Bupropion  Past psychiatric history:  -hospitalizations: denies  -suicide attempts: denies  Substance use:  -tobacco: 1 -2 a day once in awhile  -caffeine: deferred  -opioids: denies  -alcohol: denies  -cocaine: denies  -others: Clonazepam 04/10/14     History of trauma/abuse? deferred  Brief social history:   -current source of income: deferred  -living situation: lives alone      After reviewing this case with Dr.Kimawi, it was decided that this patient should create a goal/plan with her therapist on what would help with her "want" to take clonazepam. Also it was suggested that patient's prescription should be increased, from 40mg  to 50mg  for one week then 60mg  the following week.

## 2014-05-08 NOTE — Progress Notes (Signed)
Cheryl Dixon, 05/08/2014, 10:30 AM    Writer says that she is doing a lot more, and feeling a little better,not crying all the time, not sitting in bed all the time.    Writer said prozac is definitely works, patient needs a refill. Patient said Prozac and wellbutrin work best together. Patient said she can do most things, but isn't doing the most. Writer will CC PCP for refill.    Patient said now that she is doing better with the antidepressants she is able to walk more, seeing grandkids more, babysitting more.    Patient would like to catch up on missed utility bills, patient was crying when talking about how she allowed her depression to prevent her from doing anything including paying her bills.    Patient also let writer know about her past drug abuse. Patient admitted that she last used clonazepam 04/10/14. Writer asked patient if she is able to plan with her counselor/therapist on a way to prevent her from giving into her "want" to take clonazepam. Patient agreed that would be a good idea.    Patient said she smokes a cigarette or two a day, but not consistently.     Patient is seeing therapist, sees her every over week. Makes goals with therapist.    Writer will review case with psychiatrist, and PCP.

## 2014-05-08 NOTE — Progress Notes (Signed)
Prescription sent electronically to preferred pharmacy.

## 2014-05-08 NOTE — Progress Notes (Signed)
Case was discussed with care partner and chart review was done. Patient was diagnosed with depression and past drug use (on methadone), on fluoxetine 40 mg and bupropion XL 150 mg. Patient was requesting clonazepam for increased depression and anxiety. Reported some improvement of her symptoms on fluoxetine. Different stressors and following with therapist. Was advised to increase fluoxetine (partial improvement) max dose 80 mg for depression, provide psychoeducation about benzo side effects include sedation, increase risk of fall and dependence. Also, clarify about bupropion effects as that can worsen anxiety symptoms.

## 2014-05-11 ENCOUNTER — Encounter (HOSPITAL_BASED_OUTPATIENT_CLINIC_OR_DEPARTMENT_OTHER): Payer: Self-pay

## 2014-05-11 ENCOUNTER — Encounter (HOSPITAL_BASED_OUTPATIENT_CLINIC_OR_DEPARTMENT_OTHER): Payer: Self-pay | Admitting: Internal Medicine

## 2014-05-11 ENCOUNTER — Ambulatory Visit (HOSPITAL_BASED_OUTPATIENT_CLINIC_OR_DEPARTMENT_OTHER): Payer: Commercial Managed Care - HMO | Admitting: Internal Medicine

## 2014-05-11 VITALS — BP 134/96 | HR 75 | Temp 96.5°F | Wt 215.5 lb

## 2014-05-11 DIAGNOSIS — R1319 Other dysphagia: Secondary | ICD-10-CM

## 2014-05-11 DIAGNOSIS — G250 Essential tremor: Secondary | ICD-10-CM

## 2014-05-11 DIAGNOSIS — Z1239 Encounter for other screening for malignant neoplasm of breast: Secondary | ICD-10-CM

## 2014-05-11 DIAGNOSIS — F1121 Opioid dependence, in remission: Secondary | ICD-10-CM

## 2014-05-11 DIAGNOSIS — S139XXA Sprain of joints and ligaments of unspecified parts of neck, initial encounter: Secondary | ICD-10-CM

## 2014-05-11 DIAGNOSIS — Z23 Encounter for immunization: Secondary | ICD-10-CM

## 2014-05-11 MED ORDER — CYCLOBENZAPRINE HCL 5 MG PO TABS
5.0000 mg | ORAL_TABLET | Freq: Every evening | ORAL | Status: DC | PRN
Start: 2014-05-11 — End: 2014-05-17

## 2014-05-11 MED ORDER — NAPROXEN 500 MG PO TABS
500.0000 mg | ORAL_TABLET | Freq: Two times a day (BID) | ORAL | Status: DC
Start: 2014-05-11 — End: 2014-05-17

## 2014-05-11 MED ORDER — ESOMEPRAZOLE MAGNESIUM 40 MG PO CPDR
40.0000 mg | DELAYED_RELEASE_CAPSULE | Freq: Every morning | ORAL | Status: AC
Start: 2014-05-11 — End: 2015-05-12

## 2014-05-11 MED ORDER — METOPROLOL SUCCINATE ER 25 MG PO TB24
25.0000 mg | ORAL_TABLET | Freq: Every day | ORAL | Status: DC
Start: 2014-05-11 — End: 2014-07-16

## 2014-05-11 NOTE — Progress Notes (Signed)
Influenza Vaccine Procedure  May 11, 2014    1. Has the patient received the information for the influenza vaccine? Yes    2. Does the patient have any of the following contraindications?  Allergy to eggs? No  Allergic reaction to previous influenza vaccines? No  Any other problems to previous influenza vaccines? No  Paralyzed by Guillain-Barre syndrome?  No  Current moderate or severe illness? No  Allergy to contact lens solution? No    3. The vaccine has been administered in the usual fashion.     Immunization information reviewed. Current VIS reviewed and given to patient/ guardian. Verbal assent obtained from patient/ guardian.  See immunization/Injection module or chart review for date of publication and additional information. Verbal assent obtained from patient/guardian. Comfort measures for possible side effects reviewed.

## 2014-05-11 NOTE — Progress Notes (Signed)
59 year old female seen in follow up:    Neck pain  Doing some stretches  Still has quite a bit of pain  Fell off the bed 3 months ago    Vomiting  Has improved a lot since she stopped drinking milk  Not taking nexium   Has not seen GI    Tremor  Has been present before she was on medication - starting in 20s  This resolved when she was on clonazepam  Worst in the morning  Right worse than left    Opioid dependence  Down to methadone 54 mg    Depression comes and goes  Family is doing well  Worsening seems to come from nowhere  Not getting out much  Sees a therapist but she doesn't help her learn about herself.    ROS  Having lots of sweats  Most Recent Weight Reading(s)  05/11/14 : 97.75 kg (215 lb 8 oz)  04/19/14 : 97.614 kg (215 lb 3.2 oz)  02/16/14 : 98.703 kg (217 lb 9.6 oz)  11/23/13 : 95.709 kg (211 lb)  11/19/13 : 96.616 kg (213 lb)       Review of Systems   Constitutional:        + sweats   Eyes: Negative for blurred vision and double vision.   Respiratory: Negative for cough.    Cardiovascular: Negative for chest pain and palpitations.   Gastrointestinal:        As above   Genitourinary: Negative for dysuria and urgency.   Skin: Negative for rash.   Neurological: Negative for dizziness, tingling, focal weakness and headaches.   Psychiatric/Behavioral: Negative for suicidal ideas and hallucinations.     Physical Exam   Constitutional: She is oriented to person, place, and time. She appears well-developed.   HENT:   Head: Normocephalic.   Eyes: Pupils are equal, round, and reactive to light.   Neck: Normal range of motion.   Tenderness over right cervical paraspinous muscles   Cardiovascular: Normal rate, regular rhythm and normal heart sounds.    Abdominal: Soft. There is no tenderness.   Neurological: She is alert and oriented to person, place, and time. She has normal strength. She displays tremor. No cranial nerve deficit or sensory deficit. She displays a negative Romberg sign.   Reflex Scores:        Bicep reflexes are 1+ on the right side and 1+ on the left side.       Brachioradialis reflexes are 1+ on the right side and 1+ on the left side.  + intention tremor  RAM normal     Assessment and plan:  (S13.9XXA) Sprain of neck, initial encounter  (primary encounter diagnosis)  Comment: persistent pain despite home exercise program, no neurologic deficits  Plan: REFERRAL TO PHYSICAL THERAPY ( INT), naproxen         (NAPROSYN) 500 MG tablet, esomeprazole (NEXIUM)        40 MG capsule, cyclobenzaprine (FLEXERIL) 5 MG         tablet        - referred for PT  - naproxen with daily nexium as gastric prophylaxis  - recheck in six weeks    (G25.0) Essential tremor  Comment: no signs of parkinsonism  Plan: metoprolol (TOPROL-XL) 25 MG 24 hr tablet             (R13.19) Other dysphagia  Comment: need to reschedule exam that she was referred to some time ago  Plan: FL UPPER  GI SERIES W AIR CONTRAST WO KUB             (Z23) Need for prophylactic vaccination and inoculation against influenza  Comment:    Plan: IMMUNIZATION ADMIN SINGLE, RN, IMMUNIZATION         ADMIN SINGLE, RN, PR INFLUENZA VAC 4 VALENT         PRSRV FREE 3 YRS PLUS IM, CANCELED: INFLUENZA         VIRUS QUAD PRESV FREE VACCINE 3/> YRS IM         (PUBLIC)             (J42.32) Screening for malignant neoplasm of breast  Comment:    Plan: [employer] MAMMOGRAPHY SCREENING BILATERAL W CAD             (F11.21) Opioid dependence in remission  Comment:    Plan: methadone (DOLOPHINE) 10 mg/mL solution         Tapering slowly.      We discussed the patients current medications. The patient expressed understanding and no barriers to adherence were identified.  1. The patient indicates understanding of these issues and agrees with the plan.  Brief care plan is updated and reviewed with the patient.   2. The patient is given an After Visit Summary sheet that lists all medications with directions, allergies, orders placed during this encounter, and follow-up instructions.   3. I  reviewed the patient's medical information and medical history   4. I reconciled the patient's medication list and prepared and supplied needed refills.   5. I have reviewed the past medical, family, and social history sections including the medications and allergies.    [name], MD        05/11/2014  VIS given prior to administration and reviewed with the patient and or legal guardian. Patient understands the disease and the vaccine. See immunization/Injection module or chart review for date of publication and additional information.  [name], MD

## 2014-05-11 NOTE — Progress Notes (Signed)
Patient has signed a PCP change form for [facility] I will place it in the [employer] to be fax to [employer]

## 2014-05-16 ENCOUNTER — Telehealth (HOSPITAL_BASED_OUTPATIENT_CLINIC_OR_DEPARTMENT_OTHER): Payer: Self-pay

## 2014-05-16 NOTE — Progress Notes (Signed)
Received request for jury duty letter, letter drafted  and forwarded to provider. Patient requesting letter be mailed to her.

## 2014-05-16 NOTE — Telephone Encounter (Signed)
-----   Message from [facility] sent at 05/16/2014  9:06 AM EST -----  Regarding: Jury Duty  Cheryl Dixon [phone], 59 year old, female, Telephone Information:  Home Phone      [phone]  Mobile          [phone]    Patient's Preferred Pharmacy:     [facility], [place], [place]  Phone: [phone] Fax: [phone]    CONFIRMED TODAY: Cheryl Dixon NUMBER: [phone]    Patient's language of care: English    Patient's PCP: [name], MD    Person calling on behalf of patient: Patient (self)    Cheryl Dixon [phone], 59 year old, female    Calls today:  Letters  Jury Duty    Date of Madaline Savage Duty: October 15   Juror ID or badge # [id number]  Will patient Mail to [place] addressed confirmed Yes

## 2014-05-16 NOTE — Progress Notes (Signed)
Letter signed and placed in out-box to mail to pt

## 2014-05-17 ENCOUNTER — Telehealth (HOSPITAL_BASED_OUTPATIENT_CLINIC_OR_DEPARTMENT_OTHER): Payer: Self-pay | Admitting: Ambulatory Care

## 2014-05-17 DIAGNOSIS — S139XXA Sprain of joints and ligaments of unspecified parts of neck, initial encounter: Secondary | ICD-10-CM

## 2014-05-17 MED ORDER — MELOXICAM 7.5 MG PO TABS
7.5000 mg | ORAL_TABLET | Freq: Two times a day (BID) | ORAL | Status: DC
Start: 2014-05-17 — End: 2014-07-16

## 2014-05-17 MED ORDER — CYCLOBENZAPRINE HCL 5 MG PO TABS
5.0000 mg | ORAL_TABLET | Freq: Every evening | ORAL | Status: DC | PRN
Start: 2014-05-17 — End: 2014-09-15

## 2014-05-17 NOTE — Progress Notes (Signed)
Call returned/placed to pt no answer, left message on VM to call clinic back at [place]. Haig Prophet, RN, 05/17/2014, 5:26 PM

## 2014-05-17 NOTE — Progress Notes (Signed)
Naproxen 500 bid is max dose  I sent in rx for new nsaid since this seems not to be working well  Also refilled cyclobenzaprine  Has she called for appt with PT?

## 2014-05-17 NOTE — Telephone Encounter (Signed)
-----   Message from [facility]. Resendes sent at 05/17/2014 10:08 AM EST -----  Regarding: calling to speak about her neck pain medication, says shes doubling up medication because of pain  Contact: [place] [phone], 59 year old, female    Calls today: calling to speak about her neck pain medication, says shes doubling up medication because of pain  Person calling on behalf of patient: Patient (self)    Rod Can NUMBER: [phone]  Best time to call back: anytime      Patient's language of care: English    Patient does not need an interpreter.    Patient's PCP: [name], MD

## 2014-05-17 NOTE — Progress Notes (Signed)
Spoke with pt and she said she has been taking 3-4 Naprosyn tabs a day and ran out but not able to get a refill until Feb 14th    Pt said she has been hearing a really loud clicking sound in her neck and the pain is 7/10    She will like to know if she can have a refill of Naprosyn for an increase in dose and also muscle relaxers    Message sent to Dr Lisa Roca, please advise

## 2014-05-18 NOTE — Telephone Encounter (Signed)
-----   Message from [facility] sent at 05/18/2014 11:37 AM EST -----  Regarding: med questions  Contact: [phone]  Sanaa Zilberman [phone], 59 year old, female  Calls today: calling to speak about her neck pain medication, says shes doubling up medication because of pain  Person calling on behalf of patient: Patient (self)  Rod Can NUMBER: [phone]  Best time to call back: anytime  Patient's language of care: English  Patient does not need an interpreter.  Patient's PCP: [name], MD

## 2014-05-18 NOTE — Progress Notes (Signed)
Called and spoke to pt  Read message from PCP  New NSAID ordered, flexeril refilled  Pt said she will call to book an appt at [facility] for PT     Pt verbalize understanding and agrees with plan

## 2014-05-24 ENCOUNTER — Telehealth (HOSPITAL_BASED_OUTPATIENT_CLINIC_OR_DEPARTMENT_OTHER): Payer: Self-pay | Admitting: Mental Health

## 2014-05-24 ENCOUNTER — Encounter (HOSPITAL_COMMUNITY)
Admission: RE | Admit: 2014-05-24 | Discharge: 2014-05-24 | Disposition: A | Discharge status: Home / Self Care | Payer: 59 | Source: Ambulatory Visit | Attending: Rheumatology | Admitting: Rheumatology

## 2014-05-24 DIAGNOSIS — M069 Rheumatoid arthritis, unspecified: Secondary | ICD-10-CM | POA: Diagnosis present

## 2014-05-24 MED ORDER — DIPHENHYDRAMINE HCL 25 MG PO TABS
25.0000 mg | ORAL_TABLET | ORAL | Status: DC
  Filled 2014-05-24: qty 1

## 2014-05-24 MED ORDER — ACETAMINOPHEN 325 MG PO TABS: 325.0000 mg | ORAL_TABLET | ORAL | Status: DC

## 2014-05-24 MED ORDER — SODIUM CHLORIDE 0.9 % IV SOLN
INTRAVENOUS | Status: DC
  Administered 2014-05-24: 10:00:00 via INTRAVENOUS

## 2014-05-24 MED ORDER — SODIUM CHLORIDE 0.9 % IV SOLN
750.0000 mg | INTRAVENOUS | Status: DC
  Administered 2014-05-24: 750 mg via INTRAVENOUS
  Filled 2014-05-24: qty 30

## 2014-05-24 NOTE — Progress Notes (Signed)
Cheryl Dixon, 05/24/2014, 1:28 PM    Left voicemail for patient to call back.

## 2014-05-25 ENCOUNTER — Ambulatory Visit: Payer: Self-pay | Admitting: Internal Medicine

## 2014-06-02 ENCOUNTER — Ambulatory Visit: Payer: Self-pay | Admitting: Internal Medicine

## 2014-06-19 ENCOUNTER — Ambulatory Visit: Payer: Self-pay | Admitting: Internal Medicine

## 2014-06-19 ENCOUNTER — Other Ambulatory Visit (HOSPITAL_COMMUNITY): Payer: Self-pay | Admitting: *Deleted

## 2014-06-21 ENCOUNTER — Ambulatory Visit (HOSPITAL_COMMUNITY)
Admission: RE | Admit: 2014-06-21 | Discharge: 2014-06-21 | Disposition: A | Discharge status: Home / Self Care | Payer: 59 | Source: Ambulatory Visit | Attending: Rheumatology | Admitting: Rheumatology

## 2014-06-21 DIAGNOSIS — M069 Rheumatoid arthritis, unspecified: Secondary | ICD-10-CM | POA: Diagnosis not present

## 2014-06-21 LAB — COMPREHENSIVE METABOLIC PANEL
ALT: 21 U/L (ref 0–35)
AST: 26 U/L (ref 0–37)
Albumin: 3.5 g/dL (ref 3.5–5.2)
Alkaline Phosphatase: 69 U/L (ref 39–117)
Anion gap: 7 (ref 5–15)
BUN: 10 mg/dL (ref 6–23)
CO2: 30 mmol/L (ref 19–32)
Calcium: 8.4 mg/dL (ref 8.4–10.5)
Chloride: 104 mmol/L (ref 96–112)
Creatinine, Ser: 0.92 mg/dL (ref 0.50–1.10)
GFR calc Af Amer: 79 mL/min — ABNORMAL LOW (ref >90)
GFR calc non Af Amer: 68 mL/min — ABNORMAL LOW (ref >90)
Glucose, Bld: 132 mg/dL — ABNORMAL HIGH (ref 70–99)
Potassium: 3.7 mmol/L (ref 3.5–5.1)
Sodium: 141 mmol/L (ref 135–145)
Total Bilirubin: 0.5 mg/dL (ref 0.3–1.2)
Total Protein: 7.1 g/dL (ref 6.0–8.3)

## 2014-06-21 LAB — CBC WITH DIFFERENTIAL/PLATELET
Basophils Absolute: 0 10*3/uL (ref 0.0–0.1)
Basophils Relative: 0 % (ref 0–1)
Eosinophils Absolute: 0.2 10*3/uL (ref 0.0–0.7)
Eosinophils Relative: 3 % (ref 0–5)
HCT: 36.5 % (ref 36.0–46.0)
Hemoglobin: 11.5 g/dL — ABNORMAL LOW (ref 12.0–15.0)
Lymphocytes Relative: 34 % (ref 12–46)
Lymphs Abs: 2 10*3/uL (ref 0.7–4.0)
MCH: 25.2 pg — ABNORMAL LOW (ref 26.0–34.0)
MCHC: 31.5 g/dL (ref 30.0–36.0)
MCV: 79.9 fL (ref 78.0–100.0)
Monocytes Absolute: 0.3 10*3/uL (ref 0.1–1.0)
Monocytes Relative: 5 % (ref 3–12)
Neutro Abs: 3.4 10*3/uL (ref 1.7–7.7)
Neutrophils Relative %: 58 % (ref 43–77)
Platelets: 231 10*3/uL (ref 150–400)
RBC: 4.57 MIL/uL (ref 3.87–5.11)
RDW: 15.2 % (ref 11.5–15.5)
WBC: 5.9 10*3/uL (ref 4.0–10.5)

## 2014-06-21 MED ORDER — ACETAMINOPHEN 325 MG PO TABS: 325.0000 mg | ORAL_TABLET | ORAL | Status: DC

## 2014-06-21 MED ORDER — SODIUM CHLORIDE 0.9 % IV SOLN
750.0000 mg | INTRAVENOUS | Status: DC
  Administered 2014-06-21: 750 mg via INTRAVENOUS
  Filled 2014-06-21: qty 30

## 2014-06-21 MED ORDER — DIPHENHYDRAMINE HCL 25 MG PO TABS
25.0000 mg | ORAL_TABLET | ORAL | Status: DC
  Filled 2014-06-21: qty 1

## 2014-06-21 MED ORDER — SODIUM CHLORIDE 0.9 % IV SOLN
INTRAVENOUS | Status: DC
  Administered 2014-06-21: 10:00:00 via INTRAVENOUS

## 2014-06-29 ENCOUNTER — Ambulatory Visit (HOSPITAL_BASED_OUTPATIENT_CLINIC_OR_DEPARTMENT_OTHER): Payer: Commercial Managed Care - HMO | Admitting: Internal Medicine

## 2014-06-29 NOTE — Progress Notes (Signed)
59 year old female seen in follow up:    Depression  Taking fluoxetine 60 mg without noted side effects.  Increased from 40 mg at last visit    Neck pain                     This encounter was opened in error.  Please disregard.

## 2014-07-16 ENCOUNTER — Inpatient Hospital Stay (HOSPITAL_BASED_OUTPATIENT_CLINIC_OR_DEPARTMENT_OTHER)
Admission: RE | Admit: 2014-07-16 | Disposition: A | Discharge status: Home / Self Care | Payer: Self-pay | Source: Emergency Department | Attending: Internal Medicine | Admitting: Internal Medicine

## 2014-07-16 ENCOUNTER — Encounter (HOSPITAL_BASED_OUTPATIENT_CLINIC_OR_DEPARTMENT_OTHER): Payer: Self-pay

## 2014-07-16 DIAGNOSIS — J441 Chronic obstructive pulmonary disease with (acute) exacerbation: Secondary | ICD-10-CM | POA: Diagnosis present

## 2014-07-16 LAB — MANUAL WBC DIFFERENTIAL
ABSOLUTE EO COUNT MANUAL: 0.1 10*3/uL (ref 0.0–0.8)
ABSOLUTE NEUT COUNT MANUAL: 4 10*3/uL (ref 1.6–8.3)
EOSINOPHILS %: 1 % (ref 0.0–7.0)
LYMPHOCYTES %: 31 % (ref 15.0–54.0)
METAMYELOCYTES %: 1 % — ABNORMAL HIGH (ref 0–0)
MONOCYTES %: 10 % (ref 4.0–13.0)
MYELOCYTES %: 1 % — ABNORMAL HIGH (ref 0–0)
PLATELET ESTIMATE: NORMAL
PLATELET MORPHOLOGY (ABNORMAL): NORMAL
POLYMORPHONUCLEAR (SEGS) %: 56 % (ref 40.0–75.0)

## 2014-07-16 LAB — BASIC METABOLIC PANEL
ANION GAP: 9 mmol/L (ref 5–15)
BUN (UREA NITROGEN): 11 mg/dL (ref 7–18)
CALCIUM: 8.5 mg/dL (ref 8.5–10.1)
CARBON DIOXIDE: 29 mmol/L (ref 21–32)
CHLORIDE: 101 mmol/L (ref 98–107)
CREATININE: 0.9 mg/dL (ref 0.4–1.2)
ESTIMATED GLOMERULAR FILT RATE: >60 mL/min (ref >60)
Glucose Random: 129 mg/dL (ref 74–160)
POTASSIUM: 4 mmol/L (ref 3.5–5.1)
SODIUM: 139 mmol/L (ref 136–145)

## 2014-07-16 LAB — CBC, PLATELET RFLX MAN DIFF
HEMATOCRIT: 41.7 % (ref 34.1–44.9)
HEMOGLOBIN: 12.6 g/dL (ref 11.2–15.7)
MEAN CORP HGB CONC: 30.2 g/dL — ABNORMAL LOW (ref 31.0–37.0)
MEAN CORPUSCULAR HGB: 27 pg (ref 26.0–34.0)
MEAN CORPUSCULAR VOL: 89.3 fL (ref 80.0–100.0)
MEAN PLATELET VOLUME: 8.9 fL (ref 8.7–12.5)
PLATELET COUNT: 250 10*3/uL (ref 150–400)
RBC DISTRIBUTION WIDTH STD DEV: 44.2 fL (ref 35.1–46.3)
RBC DISTRIBUTION WIDTH: 13.7 % (ref 11.5–14.3)
RED BLOOD CELL COUNT: 4.67 M/uL (ref 3.90–5.20)
WHITE BLOOD CELL COUNT: 7.2 10*3/uL (ref 4.0–11.0)

## 2014-07-16 LAB — XR CHEST 2 VIEWS

## 2014-07-16 LAB — HOLD BLUE TOP TUBE

## 2014-07-16 LAB — TROPONIN I: TROPONIN I: <0.02 ng/mL (ref 0.00–0.04)

## 2014-07-16 LAB — INFLUENZA A/B, AG, EIA
INFLUENZA B: NEGATIVE
Influenza A: NEGATIVE

## 2014-07-16 LAB — D-DIMER PE/DVT, QUANTITATIVE: D-DIMER PE/DVT, QUANTITATIVE: 448 ng/mLFEU (ref 0.00–499)

## 2014-07-16 MED ORDER — METHADONE HCL 10 MG/ML PO CONC
55.0000 mg | Freq: Every day | ORAL | Status: DC
Start: 2014-07-16 — End: 2014-07-17

## 2014-07-16 MED ORDER — IPRATROPIUM-ALBUTEROL 0.5-2.5 (3) MG/3ML IN SOLN
3.0000 mL | Freq: Four times a day (QID) | RESPIRATORY_TRACT | Status: DC
Start: 2014-07-16 — End: 2014-07-17
  Administered 2014-07-16 – 2014-07-17 (×4): 3 mL via RESPIRATORY_TRACT
  Filled 2014-07-16 (×4): qty 3

## 2014-07-16 MED ORDER — PREDNISONE 50 MG PO TABS
60.0000 mg | ORAL_TABLET | Freq: Every day | ORAL | Status: DC
Start: 2014-07-17 — End: 2014-07-17
  Administered 2014-07-17: 60 mg via ORAL
  Filled 2014-07-16: qty 1

## 2014-07-16 MED ORDER — NICOTINE 21 MG/24HR TD PT24
1.0000 | MEDICATED_PATCH | Freq: Every day | TRANSDERMAL | Status: DC
Start: 2014-07-16 — End: 2014-07-17
  Filled 2014-07-16 (×2): qty 1

## 2014-07-16 MED ORDER — LEVOFLOXACIN 750 MG PO TABS
750.00 mg | ORAL_TABLET | Freq: Once | ORAL | Status: AC
Start: 2014-07-16 — End: 2014-07-16
  Administered 2014-07-16: 750 mg via ORAL
  Filled 2014-07-16: qty 1

## 2014-07-16 MED ORDER — BUPROPION HCL 75 MG PO TABS
150.0000 mg | ORAL_TABLET | Freq: Two times a day (BID) | ORAL | Status: DC
Start: 2014-07-17 — End: 2014-07-17
  Administered 2014-07-17 (×2): 150 mg via ORAL
  Filled 2014-07-16 (×2): qty 2

## 2014-07-16 MED ORDER — HEPARIN SODIUM (PORCINE) 5000 UNIT/ML IJ SOLN
5000.0000 [IU] | Freq: Two times a day (BID) | INTRAMUSCULAR | Status: DC
Start: 2014-07-16 — End: 2014-07-17
  Administered 2014-07-16 – 2014-07-17 (×2): 5000 [IU] via SUBCUTANEOUS
  Filled 2014-07-16 (×2): qty 1

## 2014-07-16 MED ORDER — PREDNISONE 20 MG PO TABS
60.00 mg | ORAL_TABLET | Freq: Once | ORAL | Status: AC
Start: 2014-07-16 — End: 2014-07-16
  Administered 2014-07-16: 60 mg via ORAL
  Filled 2014-07-16: qty 3

## 2014-07-16 MED ORDER — ALBUTEROL SULFATE HFA 108 (90 BASE) MCG/ACT IN AERS
2.0000 | INHALATION_SPRAY | RESPIRATORY_TRACT | Status: DC | PRN
Start: 2014-07-16 — End: 2014-07-17
  Filled 2014-07-16: qty 8

## 2014-07-16 MED ORDER — PANTOPRAZOLE SODIUM 40 MG PO TBEC
40.0000 mg | DELAYED_RELEASE_TABLET | Freq: Every day | ORAL | Status: DC
Start: 2014-07-16 — End: 2014-07-17
  Administered 2014-07-16 – 2014-07-17 (×2): 40 mg via ORAL
  Filled 2014-07-16 (×2): qty 1

## 2014-07-16 MED ORDER — NORMAL SALINE FLUSH 0.9 % IV SOLN
5.0000 mL | INTRAVENOUS | Status: DC
Start: 2014-07-16 — End: 2014-07-17

## 2014-07-16 MED ORDER — IPRATROPIUM-ALBUTEROL 0.5-2.5 (3) MG/3ML IN SOLN
3.00 mL | Freq: Once | RESPIRATORY_TRACT | Status: AC
Start: 2014-07-16 — End: 2014-07-16
  Administered 2014-07-16: 3 mL via RESPIRATORY_TRACT
  Filled 2014-07-16: qty 3

## 2014-07-16 MED ORDER — FLUOXETINE HCL 20 MG PO CAPS
40.0000 mg | ORAL_CAPSULE | Freq: Every day | ORAL | Status: DC
Start: 2014-07-16 — End: 2014-07-17
  Administered 2014-07-16 – 2014-07-17 (×2): 40 mg via ORAL
  Filled 2014-07-16 (×2): qty 2

## 2014-07-16 MED ORDER — TRAZODONE HCL 50 MG PO TABS
50.0000 mg | ORAL_TABLET | Freq: Every evening | ORAL | Status: DC | PRN
Start: 2014-07-16 — End: 2014-07-17
  Administered 2014-07-16: 50 mg via ORAL
  Filled 2014-07-16: qty 1

## 2014-07-16 MED ORDER — GUAIFENESIN-CODEINE 100-10 MG/5ML PO SOLN
5.0000 mL | Freq: Once | ORAL | Status: AC
  Administered 2014-07-16: 5 mL via ORAL
  Filled 2014-07-16: qty 10

## 2014-07-16 MED ORDER — ALUMINUM & MAGNESIUM HYDROXIDE 200-200 MG/5ML PO SUSP
30.0000 mL | Freq: Four times a day (QID) | ORAL | Status: DC | PRN
Start: 2014-07-16 — End: 2014-07-17

## 2014-07-16 MED ORDER — POLYETHYLENE GLYCOL 3350 17 G PO PACK
17.0000 g | PACK | Freq: Every day | ORAL | Status: DC
Start: 2014-07-16 — End: 2014-07-17
  Administered 2014-07-16 – 2014-07-17 (×2): 17 g via ORAL
  Filled 2014-07-16 (×2): qty 1

## 2014-07-16 MED ORDER — DOXYCYCLINE MONOHYDRATE 100 MG PO TABS
100.0000 mg | ORAL_TABLET | Freq: Two times a day (BID) | ORAL | Status: DC
Start: 2014-07-17 — End: 2014-07-17
  Administered 2014-07-17: 100 mg via ORAL
  Filled 2014-07-16 (×2): qty 1

## 2014-07-16 MED ORDER — ACETAMINOPHEN 325 MG PO TABS
650.0000 mg | ORAL_TABLET | Freq: Four times a day (QID) | ORAL | Status: DC | PRN
Start: 2014-07-16 — End: 2014-07-17
  Administered 2014-07-17: 650 mg via ORAL
  Filled 2014-07-16: qty 2

## 2014-07-16 MED ORDER — NICOTINE POLACRILEX 2 MG MT GUM
2.0000 mg | CHEWING_GUM | OROMUCOSAL | Status: DC | PRN
Start: 2014-07-16 — End: 2014-07-17
  Filled 2014-07-16: qty 1

## 2014-07-16 NOTE — H&P (Signed)
-ADMISSION H&P NOTE-    In preparing this admission assessment, I have interviewed the patient and reviewed old medical records.    Chief Complaint: shortness of breath, cough    HPI: Patient is a 59yo woman with a long smoking history but no known diagnosis of asthma or COPD who presents with a one week illness associated with subjective fever and chills, nausea and vomiting without diarrhea, cough and right sided chest tightness with wheezing and shortness of breath.  She presented to the ED with concern about her breathing; on presentation to the ED she was not febrile but was found to have increased work of breathing with notable wheeze.  CXR showed no infiltrate.  She responded well to steroids and bronchodilators but given persistent hypoxia with exertional O2 saturation of 88%, she was admitted to the hospital for further management and observation.    Review of Systems:  General: No change in weight, change in energy level, night sweats, change in appetite  Eyes: No change in vision, eye pain, eye discharge  ENT: No nasal discharge, odynophagia, ear pain, change in hearing  Neurologic: No numbness, tingling, focal weakness, no change in speech or cognition  Cardiac: No pedal edema, orthopnea, paroxysmal nocturnal dyspnea  Pulmonary: No hemoptysis  Gastrointestinal: No abdominal pain, change in bowel patterns, constipation, diarrhea, bright red blood per rectum, dark or tarry stool, hematemesis  Genitourinary: No hematuria, dysuria, urinary frequency, vaginal discharge  Hematologic: No easy bruising or bleeding, no lymphadenopathy  Endocrinologic: No heat or cold intolerance, no polyuria or polydipsia  Psychiatric: No change in mood, difficulty sleeping, auditory or visual hallucinations  Musculoskeletal: No weakness, no joint pain or swelling  Skin: No rash or poorly healing wounds    Past Medical History:    Past Medical History    Arthritis     Varicose veins of lower extremities     Depression      Substance addiction     Obese     Anxiety        Past Surgical History:      Past Surgical History    ANES HRNA REPAIR UPR ABD TABDL RPR DIPHRG HRNA      ENDOMETRIAL ABLTJ THERMAL W/O HYSTEROSCOPIC GID      ANES IPER LOWER ABD W/LAPS RAD HYSTERECTOMY      RPR 1ST INGUN HRNA PRETERM INFT RDC         Allergies:  Review of Patient's Allergies indicates:   Darvon                     Meperidine hcl              Comment:Nausea/vomit   Paxil [paroxetine]      Rash   Zoloft [sertraline *    Rash    Medications prior to Admission:    No current facility-administered medications on file prior to encounter.  Current Outpatient Prescriptions on File Prior to Encounter:  esomeprazole (NEXIUM) 40 MG capsule Take 1 capsule by mouth every morning before breakfast. Disp: 90 capsule Rfl: 3   buPROPion (WELLBUTRIN XL) 150 MG 24 hr tablet Take 1 tablet by mouth every morning. Disp: 30 tablet Rfl: 2       Tobacco Use:    Smoking status: Current Some Day Smoker 1.00 Packs/Day For 20.00 Years   Types: Cigarettes   Smokeless tobacco: Never Used   Comment: quit 1980-1996, then light until 2003, then 1 ppd since  Alcohol:    Alcohol Use: No       Social History:  Lives alone, 3 adult children, 8 grandchildren.  Has been engaged until recently in providing childcare to grandchildren. Spends time with sister who is 7 years her junior.  Enjoys shopping.  Became addicted to prescription narcotics in the setting of severe endometriosis, progressed to IV heroin.  Has been stable on methadone maintenance program (she reports) for 8 years.     Family History:    Family History    Heart Father     Comment: CAD, died age 65    Heart Mother     Comment: CAD    Heart Brother     Comment: CAD, died age 46    Pulmonary Father     Comment: COPD    Cancer - Breast Maternal Aunt     Comment: age 63    Cancer - Breast Maternal Aunt     Comment: age 48       Vital Signs - Last 8 Hours:  BP: (115-143)/(76-91)   Temp:  [97 F (36.1 C)-98.3 F (36.8 C)]    Pulse:  [66-86]   Resp:  [17-24]   SpO2:  [91 %-96 %]     General:  No acute distress, cooperative, pleasant, appears her stated age  44: Anicteric, extra-ocular movements full, oropharynx clear, mucus membranes moist  Neck: Supple with full range of motion, no thyromegaly  Cardiac: Rate and rhythm regular, S1 and S2 within normal limits, no murmurs/ rubs/ gallops, PMI midline, no jugular venous distension  Pulmonary: Good air movement throughout; no wheeze, rales, or rhonchi; no dullness to percussion  Abdomen: Soft, non-tender, non-distended, no hepatosplenomegaly, normoactive bowel sounds, obese  Extremities: Warm and well-perfused, no edema; left leg with prominent varicosities  Musculoskeletal: Normal bulk and tone, joints without swelling or erythema  Lymphnodes: No cervical, axillary, or inguinal lymphadenopathy palpated  Skin: No rashes or ulceration  Neuro: Alert and oriented x 3, speech fluent, cranial nerves 2-12 in tact, strength 5/5 in upper and lower extremities, gait within normal limits, no sensory deficits to light touch, no finger to nose ataxia or difficulty with rapid alternating movements      Laboratory -- last 24 hours:    Recent Labs   07/16/14  1249   NA 139   K 4.0   CL 101   CO2 29   BUN 11   CREAT 0.9   GLUCOSER 129   CA 8.5   WBC 7.2   HGB 12.6   HCT 41.7   PLTA 250     Troponin I <0.02    Microbiology: Influenza A and B negative    Imaging:  I have personally reviewed the CXR, which reveals no infiltrate, but increased interstitial markings, hyperinflation    EKG:  I have personally reviewed the EKG, which reveals normal sinus rhythm, no ischemic changes      Assessment and Plan:   In summary, patient is a 59 year old year old woman with long smoking history presenting with respiratory distress and hypoxia in the setting of resolving acute viral illness.  Likely first flare of previously undiagnosed COPD.    1) COPD  Prednisone 60mg  daily x 5 days  Standing duoneb  therapy  Doxycycline 100mg  po BID x 5 days  Albuterol MDI with spacer and teaching  Smoking cessation!    2) Hypoxia  Check D-dimer (given hypoxia and relatively normal CXR), doubt PE    3)  Depression/ anxiety  Continue outpatient regimen for mood disorder    4) Substance abuse  Continue methadone maintenance; call placed to clinic in [place] to confirm stated dose    Patient has responded quickly to emergency department care; anticipate discharge home tomorrow.  Given likely short hospital stay in this patient with a likely known diagnosis, will keep patient in observation status.        Jacelyn Pi, MD, 07/16/2014, 6:16 PM

## 2014-07-16 NOTE — Narrator Note (Signed)
Patient transported to [facility].

## 2014-07-16 NOTE — ED Triage Note (Signed)
Pt BIBa, [place] from home. Pt states 6 days cough, subjective fever and SOB. Pt speaking full sentences at triage. VSS. Pt smokes 1 pack cigarettes daily. Pt states she has been unable to smoke for the past 6 days due to cough and SOB.

## 2014-07-16 NOTE — Narrator Note (Signed)
Patient medicated with steroid and antibiotic per MD order. Flu Swab was collected and sent. Awaiting Respiratory Therapy arrival to perform Peak Flow Measurement.

## 2014-07-16 NOTE — Progress Notes (Signed)
Met with patient at bedside.  States that she lives on 2nd floor apart in [place] by herself. Patient is independent with all ADL/IADLs and ambulation without device. She denies falls. She uses public transportation.  She has never been to short term rehabilitation. Patient states that she feels safe at home and she plans on returning when medically stable. Anticipate discharge home independently with outpatient f/u when stable. No skilled needs identified.

## 2014-07-16 NOTE — Narrator Note (Signed)
Performed ambulatory oxygen saturation. Patients' oxygen sat decreased to 88-89 during ambulation, but quickly returned to 93-96 percent at rest. Patient was able to tolerate ambulation with no acute distress. Patient remains wheezy with congested cough.
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
## 2014-07-16 NOTE — Narrator Note (Signed)
Patient resting, no acute shortness of breath. Awaiting bed assignment.

## 2014-07-16 NOTE — Narrator Note (Signed)
Assumed care of this patient at this time. Patient states that her breathing is "better" after her nebulizer treatment. Patient ambulated to xray with steady gait for two view chest film.

## 2014-07-16 NOTE — Narrator Note (Signed)
Patient report to RN on [place] [facility].

## 2014-07-16 NOTE — Plan of Care (Signed)
Problem: ACTIVE OR AT RISK FOR ALCOHOL USE AS EVIDENCED BY:  Goal: PATIENT WILL IDENTIFY BEHAVIORAL STAGE RELATED TO PROBLEM AND/OR READINESS TO CHANGE BEHAVIOR.  Intervention: ENGAGE IN IDENTIFYING BEHAVIORAL STAGE RELATED TO PROBLEM/READINESS CHANGE BEHAVIORS  Discussed with pt. Smoking history and desite to make changes.  Smoking handout given. Nicotine gum  And patch offered but pt, declined.  Will continue to eval.

## 2014-07-17 LAB — BASIC METABOLIC PANEL
ANION GAP: 9 mmol/L (ref 5–15)
BUN (UREA NITROGEN): 12 mg/dL (ref 7–18)
CALCIUM: 8.7 mg/dL (ref 8.5–10.1)
CARBON DIOXIDE: 28 mmol/L (ref 21–32)
CHLORIDE: 103 mmol/L (ref 98–107)
CREATININE: 1 mg/dL (ref 0.4–1.2)
ESTIMATED GLOMERULAR FILT RATE: 57 mL/min — ABNORMAL LOW (ref >60)
Glucose Random: 104 mg/dL (ref 74–160)
POTASSIUM: 4.2 mmol/L (ref 3.5–5.1)
SODIUM: 140 mmol/L (ref 136–145)

## 2014-07-17 LAB — CBC WITH PLATELET
HEMATOCRIT: 39.9 % (ref 34.1–44.9)
HEMOGLOBIN: 12.2 g/dL (ref 11.2–15.7)
MEAN CORP HGB CONC: 30.6 g/dL — ABNORMAL LOW (ref 31.0–37.0)
MEAN CORPUSCULAR HGB: 27 pg (ref 26.0–34.0)
MEAN CORPUSCULAR VOL: 88.3 fL (ref 80.0–100.0)
MEAN PLATELET VOLUME: 9.3 fL (ref 8.7–12.5)
PLATELET COUNT: 318 10*3/uL (ref 150–400)
RBC DISTRIBUTION WIDTH STD DEV: 44 fL (ref 35.1–46.3)
RBC DISTRIBUTION WIDTH: 13.8 % (ref 11.5–14.3)
RED BLOOD CELL COUNT: 4.52 M/uL (ref 3.90–5.20)
WHITE BLOOD CELL COUNT: 9 10*3/uL (ref 4.0–11.0)

## 2014-07-17 MED ORDER — METHADONE HCL 5 MG PO TABS
55.0000 mg | ORAL_TABLET | Freq: Every day | ORAL | Status: DC
Start: 2014-07-17 — End: 2014-07-17
  Administered 2014-07-17: 55 mg via ORAL
  Filled 2014-07-17: qty 5

## 2014-07-17 MED ORDER — ALBUTEROL SULFATE HFA 108 (90 BASE) MCG/ACT IN AERS
2.0000 | INHALATION_SPRAY | RESPIRATORY_TRACT | Status: DC | PRN
Start: 2014-07-17 — End: 2014-09-15

## 2014-07-17 MED ORDER — PREDNISONE 20 MG PO TABS
60.00 mg | ORAL_TABLET | Freq: Every day | ORAL | Status: AC
Start: 2014-07-17 — End: 2014-07-21

## 2014-07-17 MED ORDER — IPRATROPIUM-ALBUTEROL 0.5-2.5 (3) MG/3ML IN SOLN
3.0000 mL | Freq: Four times a day (QID) | RESPIRATORY_TRACT | Status: DC
Start: 2014-07-17 — End: 2023-04-01

## 2014-07-17 MED ORDER — NEBULIZER DEVI
1.00 | Freq: Every day | Status: AC
Start: 2014-07-17 — End: 2014-10-15

## 2014-07-17 MED ORDER — DOXYCYCLINE MONOHYDRATE 100 MG PO TABS
100.00 mg | ORAL_TABLET | Freq: Two times a day (BID) | ORAL | Status: AC
Start: 2014-07-17 — End: 2014-07-23

## 2014-07-17 NOTE — Miscellaneous (Signed)
COPD INITIATIVE INTERVIEW  Answered by pt  1. Medications:   What medications do you take for your breathing? Pt states she doesn't have any. This is the first time she ever had a problem with her breathing. Inhaler prescription for discharge was recommended to Rn and Dr,  Albuterol:   Atrovent:   Advair:    Spiriva:   Nebs:   Others:   Doses & Frequency:   Spacer use:  Spacer and teaching was given. Pt demonstrated and verbalized understanding.  Technique: does patient demonstrate/describe proper technique? Yes   Any issues getting medications? No  2. Triggers:   Any allergies?  what? Dumerol  Any pets? NO  Any smokers in the household (including pt)? Pt states she's been smoking for 40 years.  If patient smokes, what is number of pack-years? 32 pack years    What kind of heating/AC system do you have?  Gas Heat  3. Do you use a home care company for:   Oxygen?          If so, which one?  No   CPAP?            If so, which one?  No  Nebulizer?       If so, which one? No  Does your home care company show up as scheduled? N/a  4. Follow-up:   Have you seen your doctor for follow-up since your hospital discharge? Yes

## 2014-07-17 NOTE — RN Shift Note (Signed)
Called the [facility] and confirmed Last Methadone dose 07/16/14 per   Eber Hong .

## 2014-07-17 NOTE — RN Shift Note (Signed)
C/O rib cadge pain 5/10 from coughing. PA notified. New order for tylenol. First dose given. No further c/o pain. Pt is resting comfortably

## 2014-07-17 NOTE — Progress Notes (Signed)
Per MDR, pt ready for d/c home today with outpt follow-up. Pt aware.

## 2014-07-18 ENCOUNTER — Other Ambulatory Visit (HOSPITAL_BASED_OUTPATIENT_CLINIC_OR_DEPARTMENT_OTHER): Payer: Self-pay | Admitting: Internal Medicine

## 2014-07-18 ENCOUNTER — Other Ambulatory Visit (HOSPITAL_BASED_OUTPATIENT_CLINIC_OR_DEPARTMENT_OTHER): Payer: Self-pay | Admitting: Physician Assistant

## 2014-07-18 ENCOUNTER — Telehealth (HOSPITAL_BASED_OUTPATIENT_CLINIC_OR_DEPARTMENT_OTHER): Payer: Self-pay | Admitting: Ambulatory Care

## 2014-07-18 ENCOUNTER — Other Ambulatory Visit (HOSPITAL_COMMUNITY): Payer: Self-pay | Admitting: *Deleted

## 2014-07-18 NOTE — Progress Notes (Signed)
PER Pharmacy, Cheryl Dixon is a 59 year old female has requested a refill of NAPROXEN 500MG .      Last [place] Office Visit: 06/29/2014  Last Physical Exam: 05/27/2013      Other Med Adult:  Most Recent BP Reading(s)  07/17/14 : 108/79        CHOLESTEROL (mg/dl)   Date Value   01/04/2010 264*   ----------    LOW DENSITY LIPOPROTEIN DIRECT (mg/dl)   Date Value   01/04/2010 101*   ----------    HIGH DENSITY LIPOPROTEIN (mg/dl)   Date Value   01/04/2010 38   ----------  No results found for: TG        THYROID SCREEN TSH REFLEX FT4 (uIU/mL)   Date Value   05/27/2013 2.130   ----------      No results found for: TSH      HEMOGLOBIN A1C (%)   Date Value   05/27/2013 5.9*   ----------        INR (no units)   Date Value   02/16/2007 1.0*   07/03/2006 < 1.0*   ----------       Documented patient preferred pharmacies:    [place], [place], [place] - [place]  Phone: [phone] Fax: [phone]

## 2014-07-18 NOTE — Discharge Summary (Signed)
Physician Discharge Summary     Patient ID:  Cheryl Dixon  [id number]  59 year old  05/27/1955    Admit date: 07/16/2014  2:08 PM    Discharge date and time: 07/17/14     Admitting Physician: Jacelyn Pi     Discharge Physician: Lyndon Code    Discharge Diagnoses: 1. COPD exacerbation     Admission Condition: fair    Discharged Condition: good    Indication for Admission: COPD exacerbation     Hospital Course: Ms. Cheryl Dixon is a 59 yo woman with a long smoking history who was admitted to the medicine service on 3/20 with worsening sob and respiratory distress. She was admitted for possible COPD exacerbation.    1. COPD exacerbation: treated with duonebs, prednisone 60 mg daily and doxycycline 100 mg BID. She was doing well at the time of discharge without any wheezing, sob or exertional dyspnea. She is not requiring oxygen at the time of discharge. She will be discharged home with a prescription of nebulized albuterol/ipratropium, prednisone 60 mg for a total of 5 days and a 7 day course of doxycycline. She will f/u with her PCP in 1-2 weeks.     Consults: none    Significant Diagnostic Studies: CXR     Treatments: as noted above     Discharge Exam:  Gen: AOX3, no apparent distress  Eyes: PERRLA, EOMI  ENT: no oral lesions, moist mucus membranes  Neck: no lad, tm, jvd  Chest: no chest deformities  CV: RRR, No MRG  Respiratory: b/l CTA  GI: soft nt/nd no hsm  Ext: no cce  Neuro: intact    Disposition: home    Patient Instructions:   Discharge Medication List as of 07/17/2014  2:40 PM    START taking these medications    albuterol (PROVENTIL HFA,VENTOLIN HFA, PROAIR HFA) 108 (90 BASE) MCG/ACT inhaler  Inhale 2 puffs into the lungs every 2 (two) hours as needed for Wheezing.E-Prescribe, Disp-1 Inhaler, R-0    doxycycline (ADOXA) 100 MG tablet  Take 1 tablet by mouth 2 (two) times daily. First dose on 3/21 eveningE-Prescribe, Disp-13 tablet, R-0    ipratropium-albuterol (DUO-NEB) 0.5-2.5 (3) MG/3ML SOLN Inhalation  Solution  Take 3 mLs by nebulization 4 (four) times daily.E-Prescribe, Disp-180 mL, R-0    predniSONE (DELTASONE) 20 MG tablet  Take 3 tablets by mouth daily.E-Prescribe, Disp-12 tablet, R-0    Respiratory Therapy Supplies (NEBULIZER) DEVI Device  1 each by Other route daily.E-Prescribe, Disp-1 Device, R-0      CONTINUE these medications which have NOT CHANGED    FLUoxetine (PROZAC) 40 MG capsule  Take 40 mg by mouth daily.Historical    methadone (DOLOPHINE) 10 mg/mL solution  Take 55 mg by mouth daily.Historical    polyethylene glycol (GLYCOLAX/MIRALAX) packet  Take 17 g by mouth daily.Historical    esomeprazole (NEXIUM) 40 MG capsule  Take 1 capsule by mouth every morning before breakfast.E-Prescribe, Disp-90 capsule, R-3This prescription was filled today. Any refills authorized will be placed on file.    buPROPion (WELLBUTRIN XL) 150 MG 24 hr tablet  Take 1 tablet by mouth every morning.E-Prescribe, Disp-30 tablet, R-2        Activity: activity as tolerated  Diet: cardiac diet  Wound Care: none needed    [place] Future Appointments:  Current and Future Appointments at [facility] (90 Days)                  07/24/2014 11:50 AM OFFICE 20 20 min  [place] [[id number]] Adonis Housekeeper          Follow-up with:  Follow-up Information     None        Code Status during hospital stay: Full Code  I spent more than 30 minutes in discharging the patient, greater than 50% of which was spent in counseling and coordinating discharge planning.     Signed:  Lyndon Code, MD  07/18/2014  11:34 AM

## 2014-07-18 NOTE — Progress Notes (Signed)
Called and spoke with pt who is at the Methadone clinic  She said she is doing fine.  Denies SOB.    Pt said she will be picking up her meds after she leaves the methadone clinic.    Kim at [facility]one is requesting recent EKG and d/c paperwork from the [facility]  Paperwork faxed to [phone]    F/u appt booked with Arleigh for next wk  Shauntea Lok J. Ahan Eisenberger, RN, 07/18/2014, 9:52 AM

## 2014-07-18 NOTE — Progress Notes (Unsigned)
PER Pharmacy, Cheryl Dixon is a 59 year old female has requested a refill of FLUOXETINE 40MG .      Last [facility] Office Visit: 06/29/2014  Last Physical Exam: 05/27/2013      Other Med Adult:  Most Recent BP Reading(s)  07/17/14 : 108/79        CHOLESTEROL (mg/dl)   Date Value   01/04/2010 264*   ----------    LOW DENSITY LIPOPROTEIN DIRECT (mg/dl)   Date Value   01/04/2010 101*   ----------    HIGH DENSITY LIPOPROTEIN (mg/dl)   Date Value   01/04/2010 38   ----------  No results found for: TG        THYROID SCREEN TSH REFLEX FT4 (uIU/mL)   Date Value   05/27/2013 2.130   ----------      No results found for: TSH      HEMOGLOBIN A1C (%)   Date Value   05/27/2013 5.9*   ----------        INR (no units)   Date Value   02/16/2007 1.0*   07/03/2006 < 1.0*   ----------       Documented patient preferred pharmacies:    [place], [place], [place] - [place]  Phone: [phone] Fax: [phone]

## 2014-07-19 ENCOUNTER — Telehealth (HOSPITAL_BASED_OUTPATIENT_CLINIC_OR_DEPARTMENT_OTHER): Payer: Self-pay | Admitting: Ambulatory Care

## 2014-07-19 ENCOUNTER — Encounter (HOSPITAL_COMMUNITY)
Admission: RE | Admit: 2014-07-19 | Discharge: 2014-07-19 | Disposition: A | Discharge status: Home / Self Care | Payer: 59 | Source: Ambulatory Visit | Attending: Rheumatology | Admitting: Rheumatology

## 2014-07-19 DIAGNOSIS — M069 Rheumatoid arthritis, unspecified: Secondary | ICD-10-CM | POA: Insufficient documentation

## 2014-07-19 MED ORDER — SODIUM CHLORIDE 0.9 % IV SOLN
750.0000 mg | INTRAVENOUS | Status: DC
  Administered 2014-07-19: 750 mg via INTRAVENOUS
  Filled 2014-07-19: qty 30

## 2014-07-19 MED ORDER — SODIUM CHLORIDE 0.9 % IV SOLN: INTRAVENOUS | Status: DC

## 2014-07-19 NOTE — Progress Notes (Signed)
Spoke with pt and told her we received notification from her pharmacy that she has not been filling her Prozac or Metoprolol  Pt said she has been taking her meds as directed and currently has script she need to pick up at the pharmacy    She mentioned she did not get her duoneb because Medicare did not pay for it  Pt was told to call [facility].   She said nurse at the hospital told her they sent special paperwork to the insurance to get this medicine approved    Told pt to call the clinic after she goes to the pharmacy and encounter any issues    Pt verbalize understanding and agrees with plan  Ivey Cina J. Lelani Garnett, RN, 07/19/2014, 3:07 PM

## 2014-07-21 LAB — EKG

## 2014-07-24 ENCOUNTER — Ambulatory Visit (HOSPITAL_BASED_OUTPATIENT_CLINIC_OR_DEPARTMENT_OTHER): Payer: Commercial Managed Care - HMO | Admitting: Physician Assistant

## 2014-07-28 ENCOUNTER — Telehealth (HOSPITAL_BASED_OUTPATIENT_CLINIC_OR_DEPARTMENT_OTHER): Payer: Self-pay | Admitting: Internal Medicine

## 2014-07-28 ENCOUNTER — Ambulatory Visit (HOSPITAL_BASED_OUTPATIENT_CLINIC_OR_DEPARTMENT_OTHER): Payer: Commercial Managed Care - HMO | Admitting: Physician Assistant

## 2014-08-01 ENCOUNTER — Other Ambulatory Visit (HOSPITAL_BASED_OUTPATIENT_CLINIC_OR_DEPARTMENT_OTHER): Payer: Self-pay

## 2014-08-01 NOTE — Telephone Encounter (Signed)
pt due for colonoscopy/mammogram, lm to give office a call back to r/s missed appts and to sched health maintenance appts    This team member called Patient to schedule an appointment for a breast cancer screening (mammogram) and for a colorectal screening and sched missed appt    Patient did not answer, this team member left a message asking for a call back.    Jackie Plum, 08/01/2014, 11:05 AM

## 2014-08-16 ENCOUNTER — Ambulatory Visit (INDEPENDENT_AMBULATORY_CARE_PROVIDER_SITE_OTHER): Payer: 59 | Admitting: Podiatry

## 2014-08-16 ENCOUNTER — Encounter (HOSPITAL_COMMUNITY): Payer: 59

## 2014-08-16 ENCOUNTER — Encounter: Payer: Self-pay | Admitting: Podiatry

## 2014-08-16 VITALS — BP 130/82 | HR 100 | Resp 12

## 2014-08-16 DIAGNOSIS — L84 Corns and callosities: Secondary | ICD-10-CM

## 2014-08-16 NOTE — Progress Notes (Signed)
   Subjective:    Patient ID: Michelle Roberts, female    DOB: Jan 24, 1956, 59 y.o.   MRN: [id number]  HPI   Patient is complaining of painful corn on the fifth left toe. Associated previous surgery with persistence of the corn.  Review of Systems  Skin: Positive for color change.  All other systems reviewed and are negative.      Objective:   Physical Exam  Surgical scars fifth toes bilaterally Well-organized fibrous corn lateral fifth left toe      Assessment & Plan:   Assessment: Painful keratoses fifth left toe  Plan: Debridement of keratoses and applied pad to the fifth left toe Patient knows this is a chronic problem and will return as needed.

## 2014-08-28 ENCOUNTER — Other Ambulatory Visit (HOSPITAL_COMMUNITY): Payer: Self-pay | Admitting: *Deleted

## 2014-08-29 ENCOUNTER — Encounter (HOSPITAL_COMMUNITY)
Admission: RE | Admit: 2014-08-29 | Discharge: 2014-08-29 | Disposition: A | Discharge status: Home / Self Care | Payer: 59 | Source: Ambulatory Visit | Attending: Rheumatology | Admitting: Rheumatology

## 2014-08-29 DIAGNOSIS — M069 Rheumatoid arthritis, unspecified: Secondary | ICD-10-CM | POA: Diagnosis present

## 2014-08-29 LAB — DIFFERENTIAL
Basophils Absolute: 0 10*3/uL (ref 0.0–0.1)
Basophils Relative: 0 % (ref 0–1)
Eosinophils Absolute: 0.2 10*3/uL (ref 0.0–0.7)
Eosinophils Relative: 2 % (ref 0–5)
Lymphocytes Relative: 34 % (ref 12–46)
Lymphs Abs: 2.5 10*3/uL (ref 0.7–4.0)
Monocytes Absolute: 0.4 10*3/uL (ref 0.1–1.0)
Monocytes Relative: 5 % (ref 3–12)
Neutro Abs: 4.4 10*3/uL (ref 1.7–7.7)
Neutrophils Relative %: 59 % (ref 43–77)

## 2014-08-29 LAB — COMPREHENSIVE METABOLIC PANEL
ALT: 21 U/L (ref 14–54)
AST: 21 U/L (ref 15–41)
Albumin: 3.5 g/dL (ref 3.5–5.0)
Alkaline Phosphatase: 70 U/L (ref 38–126)
Anion gap: 11 (ref 5–15)
BUN: 16 mg/dL (ref 6–20)
CO2: 29 mmol/L (ref 22–32)
Calcium: 8.7 mg/dL — ABNORMAL LOW (ref 8.9–10.3)
Chloride: 102 mmol/L (ref 101–111)
Creatinine, Ser: 0.84 mg/dL (ref 0.44–1.00)
GFR calc Af Amer: >60 mL/min (ref >60)
GFR calc non Af Amer: >60 mL/min (ref >60)
Glucose, Bld: 106 mg/dL — ABNORMAL HIGH (ref 70–99)
Potassium: 3.8 mmol/L (ref 3.5–5.1)
Sodium: 142 mmol/L (ref 135–145)
Total Bilirubin: 0.6 mg/dL (ref 0.3–1.2)
Total Protein: 7.2 g/dL (ref 6.5–8.1)

## 2014-08-29 LAB — CBC
HCT: 39.6 % (ref 36.0–46.0)
Hemoglobin: 12.3 g/dL (ref 12.0–15.0)
MCH: 24.9 pg — ABNORMAL LOW (ref 26.0–34.0)
MCHC: 31.1 g/dL (ref 30.0–36.0)
MCV: 80.2 fL (ref 78.0–100.0)
Platelets: 256 10*3/uL (ref 150–400)
RBC: 4.94 MIL/uL (ref 3.87–5.11)
RDW: 14.6 % (ref 11.5–15.5)
WBC: 7.5 10*3/uL (ref 4.0–10.5)

## 2014-08-29 MED ORDER — DIPHENHYDRAMINE HCL 25 MG PO TABS: 50.0000 mg | ORAL_TABLET | ORAL | Status: DC

## 2014-08-29 MED ORDER — ACETAMINOPHEN 325 MG PO TABS: 650.0000 mg | ORAL_TABLET | ORAL | Status: DC

## 2014-08-29 MED ORDER — SODIUM CHLORIDE 0.9 % IV SOLN
750.0000 mg | INTRAVENOUS | Status: DC
  Administered 2014-08-29: 750 mg via INTRAVENOUS
  Filled 2014-08-29: qty 30

## 2014-08-29 MED ORDER — DIPHENHYDRAMINE HCL 25 MG PO CAPS: 50.0000 mg | ORAL_CAPSULE | ORAL | Status: DC

## 2014-09-07 ENCOUNTER — Telehealth (HOSPITAL_BASED_OUTPATIENT_CLINIC_OR_DEPARTMENT_OTHER): Payer: Self-pay | Admitting: Ambulatory Care

## 2014-09-07 NOTE — Progress Notes (Signed)
Received a compliance report from Insurance to find out if pt is taking Metoprolol  Pt said she is taking it daily as directed    She will like an appt for EKG per Methadone clinic  appt booked with Vicente Males 5/20 at 1030 am  Donnelle Rubey J. Haig Prophet, RN, 09/07/2014, 4:22 PM

## 2014-09-15 ENCOUNTER — Ambulatory Visit (HOSPITAL_BASED_OUTPATIENT_CLINIC_OR_DEPARTMENT_OTHER): Payer: Commercial Managed Care - HMO | Admitting: Physician Assistant

## 2014-09-15 ENCOUNTER — Encounter (HOSPITAL_BASED_OUTPATIENT_CLINIC_OR_DEPARTMENT_OTHER): Payer: Self-pay | Admitting: Physician Assistant

## 2014-09-15 VITALS — BP 107/70 | HR 71 | Wt 210.0 lb

## 2014-09-15 DIAGNOSIS — Z79899 Other long term (current) drug therapy: Secondary | ICD-10-CM

## 2014-09-15 DIAGNOSIS — G44309 Post-traumatic headache, unspecified, not intractable: Secondary | ICD-10-CM

## 2014-09-15 DIAGNOSIS — S139XXA Sprain of joints and ligaments of unspecified parts of neck, initial encounter: Secondary | ICD-10-CM

## 2014-09-15 DIAGNOSIS — J449 Chronic obstructive pulmonary disease, unspecified: Secondary | ICD-10-CM | POA: Insufficient documentation

## 2014-09-15 DIAGNOSIS — N951 Menopausal and female climacteric states: Secondary | ICD-10-CM

## 2014-09-15 MED ORDER — CYCLOBENZAPRINE HCL 5 MG PO TABS
5.0000 mg | ORAL_TABLET | Freq: Three times a day (TID) | ORAL | 0 refills | Status: DC | PRN
Start: 2014-09-15 — End: 2016-08-27

## 2014-09-15 MED ORDER — ALBUTEROL SULFATE HFA 108 (90 BASE) MCG/ACT IN AERS
2.0000 | INHALATION_SPRAY | RESPIRATORY_TRACT | 1 refills | Status: DC | PRN
Start: 2014-09-15 — End: 2014-11-14

## 2014-09-15 NOTE — Progress Notes (Signed)
Cheryl Dixon is a 59 year old female who presents to the office because she needs to have EKG for methadone clinic.      Neck pain and headache:  Reports that she fell back in January, 2016 and his her head and injured her neck.  Reports that headache got better, but neck pain did not get better.  Gets neck pain when she turns neck to the right. Also, she feels clicking in her neck when she turns to the right.  Was seen for that and evaluated by her PCP on 05/11/14.  As per Dr. Silvana Newness note he referred to for PT.  Patient had not done PT yet.    Reports that a week ago (last Friday) she was babysitting her grandchildren; was playing with them and running around and ran into a poll and hit her head again. She felt snap in her neck when she hit her head.  Reports that since that time her headaches got worse and neck pain is worse as well.  Neck pain is localized to the right side and radiates to her right shoulder and upper arm.  Has 7/10 neck pain at rest and when she turns it gets worse.  Pain is worse at night.  Denies having tingling/numbness in her right arm/hand, weakness in her arms/hands.  Takes Tylenol 250 mg 4 pills a day and it irritates her stomach. Has trouble with her stomach for long time; takes Nexium, Zantac or Prilosec; takes one of them every but not all 3 at the same time.  Was applying heat to her neck.      Headache is localized to the forehead.  Headache is pounding and it comes goes; can last for several hrs.  (+) nausea.  Reports that since she hit her head back in 04/2014 her short-tem memory is not as good..  Denies having vomiting, recurrent fever/chills, change in her balance, changes in her mood.  Sleeps fine.        BP 107/70  Pulse 71  Wt 95.3 kg (210 lb)  LMP 11/20/1991  SpO2 96%  BMI 37.21 kg/m2     Physical Exam   Constitutional: She is oriented to person, place, and time. She appears well-developed and well-nourished. No distress.   HENT:   Right Ear: External ear normal.   Left Ear:  External ear normal.   Mouth/Throat: Oropharynx is clear and moist. No oropharyngeal exudate.   Eyes: Conjunctivae and EOM are normal. Pupils are equal, round, and reactive to light. Right eye exhibits no discharge. Left eye exhibits no discharge. No scleral icterus.   No nystagmus   Neck: No JVD present. No tracheal deviation present. No thyromegaly present.   Cardiovascular: Regular rhythm.    Musculoskeletal: She exhibits no edema.   Lymphadenopathy:     She has no cervical adenopathy.   Neurological: She is alert and oriented to person, place, and time. No cranial nerve deficit. She exhibits normal muscle tone. Coordination normal.   Romberg is negative.  Gait is stable.   Skin: Skin is warm. She is not diaphoretic.   Psychiatric: She has a normal mood and affect. Her behavior is normal. Judgment and thought content normal.   Nursing note and vitals reviewed.    Assessment/Plan:  (S13.9XXA) Sprain of neck, initial encounter  (primary encounter diagnosis)  Comment: sprained neck back in 05/2014 and also re-injured it 1 week ago.  I do not think that imaging is necessary at this time.  Plan: 1) cyclobenzaprine (  FLEXERIL) 5 MG tablet           2) Referral for PT has been placed again.           3) was instructed to return to the office if pain gets worse or not better after she finishes a course of PT.        (N95.1) Symptomatic menopausal or female climacteric states  Comment: wants to discuss her options.  Already taking Prozac 40 mg daily.  Plan: REFERRAL TO GYNECOLOGY (INT)              (Z79.899) Medication management  Plan: EKG; as per my read; NSR with no significant ST changes; QtC normal. No significant changes compared to EKG on 07/16/14      A copy of EKG was provided to the patient.    (G44.309) Post-concussion headache  Comment: hits her head back in 04/2014 and 1 week ago.  Plan: was instructed to return to the office if headache is not better within 3-4 weeks or gets worse.      (J44.9) Chronic  obstructive pulmonary disease, unspecified COPD type  Plan: albuterol (PROVENTIL HFA,VENTOLIN HFA, PROAIR         HFA) 108 (90 BASE) MCG/ACT inhaler-refilled.    We discussed the patients current medications. The patient expressed understanding and no barriers to adherence were identified.

## 2014-09-17 LAB — EKG

## 2014-09-18 ENCOUNTER — Encounter (HOSPITAL_BASED_OUTPATIENT_CLINIC_OR_DEPARTMENT_OTHER): Payer: Self-pay | Admitting: Internal Medicine

## 2014-09-27 ENCOUNTER — Encounter (HOSPITAL_COMMUNITY)
Admission: RE | Admit: 2014-09-27 | Discharge: 2014-09-27 | Disposition: A | Discharge status: Home / Self Care | Payer: 59 | Source: Ambulatory Visit | Attending: Rheumatology | Admitting: Rheumatology

## 2014-09-27 DIAGNOSIS — M069 Rheumatoid arthritis, unspecified: Secondary | ICD-10-CM | POA: Insufficient documentation

## 2014-09-27 MED ORDER — DIPHENHYDRAMINE HCL 25 MG PO CAPS: 50.0000 mg | ORAL_CAPSULE | ORAL | Status: DC

## 2014-09-27 MED ORDER — SODIUM CHLORIDE 0.9 % IV SOLN
INTRAVENOUS | Status: DC
  Administered 2014-09-27: 09:00:00 via INTRAVENOUS

## 2014-09-27 MED ORDER — ACETAMINOPHEN 325 MG PO TABS: 650.0000 mg | ORAL_TABLET | ORAL | Status: DC

## 2014-09-27 MED ORDER — SODIUM CHLORIDE 0.9 % IV SOLN
750.0000 mg | INTRAVENOUS | Status: DC
  Administered 2014-09-27: 750 mg via INTRAVENOUS
  Filled 2014-09-27: qty 30

## 2014-10-24 ENCOUNTER — Encounter (HOSPITAL_COMMUNITY): Payer: 59

## 2014-11-02 ENCOUNTER — Encounter (HOSPITAL_COMMUNITY)
Admission: RE | Admit: 2014-11-02 | Discharge: 2014-11-02 | Disposition: A | Discharge status: Home / Self Care | Payer: 59 | Source: Ambulatory Visit | Attending: Rheumatology | Admitting: Rheumatology

## 2014-11-02 DIAGNOSIS — M069 Rheumatoid arthritis, unspecified: Secondary | ICD-10-CM | POA: Insufficient documentation

## 2014-11-02 LAB — COMPREHENSIVE METABOLIC PANEL
ALT: 19 U/L (ref 14–54)
AST: 22 U/L (ref 15–41)
Albumin: 3.5 g/dL (ref 3.5–5.0)
Alkaline Phosphatase: 71 U/L (ref 38–126)
Anion gap: 8 (ref 5–15)
BUN: 10 mg/dL (ref 6–20)
CO2: 29 mmol/L (ref 22–32)
Calcium: 8.9 mg/dL (ref 8.9–10.3)
Chloride: 104 mmol/L (ref 101–111)
Creatinine, Ser: 0.89 mg/dL (ref 0.44–1.00)
GFR calc Af Amer: >60 mL/min (ref >60)
GFR calc non Af Amer: >60 mL/min (ref >60)
Glucose, Bld: 119 mg/dL — ABNORMAL HIGH (ref 65–99)
Potassium: 3.7 mmol/L (ref 3.5–5.1)
Sodium: 141 mmol/L (ref 135–145)
Total Bilirubin: 0.6 mg/dL (ref 0.3–1.2)
Total Protein: 7.3 g/dL (ref 6.5–8.1)

## 2014-11-02 LAB — CBC WITH DIFFERENTIAL/PLATELET
Basophils Absolute: 0 10*3/uL (ref 0.0–0.1)
Basophils Relative: 1 % (ref 0–1)
Eosinophils Absolute: 0.2 10*3/uL (ref 0.0–0.7)
Eosinophils Relative: 3 % (ref 0–5)
HCT: 37.9 % (ref 36.0–46.0)
Hemoglobin: 11.6 g/dL — ABNORMAL LOW (ref 12.0–15.0)
Lymphocytes Relative: 27 % (ref 12–46)
Lymphs Abs: 1.9 10*3/uL (ref 0.7–4.0)
MCH: 24.2 pg — ABNORMAL LOW (ref 26.0–34.0)
MCHC: 30.6 g/dL (ref 30.0–36.0)
MCV: 79 fL (ref 78.0–100.0)
Monocytes Absolute: 0.3 10*3/uL (ref 0.1–1.0)
Monocytes Relative: 5 % (ref 3–12)
Neutro Abs: 4.7 10*3/uL (ref 1.7–7.7)
Neutrophils Relative %: 64 % (ref 43–77)
Platelets: 238 10*3/uL (ref 150–400)
RBC: 4.8 MIL/uL (ref 3.87–5.11)
RDW: 15.7 % — ABNORMAL HIGH (ref 11.5–15.5)
WBC: 7.2 10*3/uL (ref 4.0–10.5)

## 2014-11-02 MED ORDER — SODIUM CHLORIDE 0.9 % IV SOLN
750.0000 mg | INTRAVENOUS | Status: DC
  Administered 2014-11-02: 750 mg via INTRAVENOUS
  Filled 2014-11-02: qty 30

## 2014-11-02 MED ORDER — SODIUM CHLORIDE 0.9 % IV SOLN
INTRAVENOUS | Status: DC
  Administered 2014-11-02: 10:00:00 via INTRAVENOUS

## 2014-11-27 ENCOUNTER — Ambulatory Visit (HOSPITAL_BASED_OUTPATIENT_CLINIC_OR_DEPARTMENT_OTHER): Payer: Commercial Managed Care - HMO | Admitting: Obstetrics & Gynecology

## 2014-11-28 ENCOUNTER — Encounter (HOSPITAL_COMMUNITY)
Admission: RE | Admit: 2014-11-28 | Discharge: 2014-11-28 | Disposition: A | Discharge status: Home / Self Care | Payer: 59 | Source: Ambulatory Visit | Attending: Rheumatology | Admitting: Rheumatology

## 2014-11-28 DIAGNOSIS — M069 Rheumatoid arthritis, unspecified: Secondary | ICD-10-CM | POA: Diagnosis present

## 2014-11-28 MED ORDER — ACETAMINOPHEN 325 MG PO TABS: 650.0000 mg | ORAL_TABLET | ORAL | Status: DC

## 2014-11-28 MED ORDER — DIPHENHYDRAMINE HCL 25 MG PO CAPS: 50.0000 mg | ORAL_CAPSULE | ORAL | Status: DC

## 2014-11-28 MED ORDER — SODIUM CHLORIDE 0.9 % IV SOLN
750.0000 mg | INTRAVENOUS | Status: AC
  Administered 2014-11-28: 750 mg via INTRAVENOUS
  Filled 2014-11-28: qty 30

## 2014-11-28 MED ORDER — SODIUM CHLORIDE 0.9 % IV SOLN
INTRAVENOUS | Status: DC
  Administered 2014-11-28: 250 mL via INTRAVENOUS

## 2014-11-29 LAB — QUANTIFERON IN TUBE
QFT TB AG MINUS NIL VALUE: <0 IU/mL
QUANTIFERON MITOGEN VALUE: 8.71 IU/mL
QUANTIFERON TB AG VALUE: 0.03 IU/mL
QUANTIFERON TB GOLD: NEGATIVE
Quantiferon Nil Value: 0.06 IU/mL

## 2014-11-29 LAB — QUANTIFERON TB GOLD ASSAY (BLOOD)

## 2014-11-30 DIAGNOSIS — M069 Rheumatoid arthritis, unspecified: Secondary | ICD-10-CM | POA: Diagnosis not present

## 2014-12-25 ENCOUNTER — Telehealth (HOSPITAL_BASED_OUTPATIENT_CLINIC_OR_DEPARTMENT_OTHER): Payer: Self-pay | Admitting: Ambulatory Care

## 2014-12-25 ENCOUNTER — Other Ambulatory Visit (HOSPITAL_COMMUNITY): Payer: Self-pay | Admitting: *Deleted

## 2014-12-25 NOTE — Progress Notes (Signed)
Letters signed and given to [employer]

## 2014-12-25 NOTE — Progress Notes (Signed)
Utility letters written and left for approval.  They will then be placed in out box to be mailed   Aleeah Greeno J. Haig Prophet, RN, 12/25/2014, 12:21 PM

## 2014-12-25 NOTE — Telephone Encounter (Signed)
-----   Message from Bunnie Philips sent at 12/25/2014 12:06 PM EDT -----  Regarding: utility letter   Contact: [phone]      Cheryl Dixon [id number], 59 year old, female, Telephone Information:  Home Phone      [phone]  Work Phone      Not on file.  Mobile          [phone]  Home Phone      Not on file.    CONFIRMED TODAY: Yes    CALL BACK NUMBER: [phone]      Patient's language of care: English    Patient does not need an interpreter.    Patient's PCP: [name], MD    Person calling on behalf of patient: Patient (self)    Calls today requesting a letter for: Utilities   Name of Utility: [employer]   Name on Utility Account: Cheryl Dixon   Utility Account Number: [id number]  Fax Number to send Letter: fax number:[phone]    Name of Utility: [employer]  Name on Utility Account: Cheryl Dixon  Utility Account Number: [id number]  Fax Number to send Letter: [phone]

## 2014-12-26 ENCOUNTER — Encounter (HOSPITAL_COMMUNITY): Admission: RE | Admit: 2014-12-26 | Payer: 59 | Source: Ambulatory Visit

## 2015-01-04 ENCOUNTER — Other Ambulatory Visit (HOSPITAL_COMMUNITY): Payer: Self-pay

## 2015-01-05 ENCOUNTER — Encounter (HOSPITAL_COMMUNITY)
Admission: RE | Admit: 2015-01-05 | Discharge: 2015-01-05 | Disposition: A | Discharge status: Home / Self Care | Payer: 59 | Source: Ambulatory Visit | Attending: Rheumatology | Admitting: Rheumatology

## 2015-01-05 DIAGNOSIS — M069 Rheumatoid arthritis, unspecified: Secondary | ICD-10-CM | POA: Diagnosis not present

## 2015-01-05 LAB — COMPREHENSIVE METABOLIC PANEL
ALT: 22 U/L (ref 14–54)
AST: 28 U/L (ref 15–41)
Albumin: 3.7 g/dL (ref 3.5–5.0)
Alkaline Phosphatase: 74 U/L (ref 38–126)
Anion gap: 9 (ref 5–15)
BUN: 12 mg/dL (ref 6–20)
CO2: 26 mmol/L (ref 22–32)
Calcium: 8.4 mg/dL — ABNORMAL LOW (ref 8.9–10.3)
Chloride: 103 mmol/L (ref 101–111)
Creatinine, Ser: 0.93 mg/dL (ref 0.44–1.00)
GFR calc Af Amer: >60 mL/min (ref >60)
GFR calc non Af Amer: >60 mL/min (ref >60)
Glucose, Bld: 121 mg/dL — ABNORMAL HIGH (ref 65–99)
Potassium: 4 mmol/L (ref 3.5–5.1)
Sodium: 138 mmol/L (ref 135–145)
Total Bilirubin: 0.7 mg/dL (ref 0.3–1.2)
Total Protein: 7.3 g/dL (ref 6.5–8.1)

## 2015-01-05 LAB — CBC WITH DIFFERENTIAL/PLATELET
Basophils Absolute: 0 10*3/uL (ref 0.0–0.1)
Basophils Relative: 0 % (ref 0–1)
Eosinophils Absolute: 0.3 10*3/uL (ref 0.0–0.7)
Eosinophils Relative: 4 % (ref 0–5)
HCT: 40.2 % (ref 36.0–46.0)
Hemoglobin: 12.7 g/dL (ref 12.0–15.0)
Lymphocytes Relative: 23 % (ref 12–46)
Lymphs Abs: 1.8 10*3/uL (ref 0.7–4.0)
MCH: 25.6 pg — ABNORMAL LOW (ref 26.0–34.0)
MCHC: 31.6 g/dL (ref 30.0–36.0)
MCV: 81 fL (ref 78.0–100.0)
Monocytes Absolute: 0.5 10*3/uL (ref 0.1–1.0)
Monocytes Relative: 6 % (ref 3–12)
Neutro Abs: 5.3 10*3/uL (ref 1.7–7.7)
Neutrophils Relative %: 67 % (ref 43–77)
Platelets: 254 10*3/uL (ref 150–400)
RBC: 4.96 MIL/uL (ref 3.87–5.11)
RDW: 16.5 % — ABNORMAL HIGH (ref 11.5–15.5)
WBC: 8 10*3/uL (ref 4.0–10.5)

## 2015-01-05 MED ORDER — SODIUM CHLORIDE 0.9 % IV SOLN
INTRAVENOUS | Status: DC
  Administered 2015-01-05: 09:00:00 via INTRAVENOUS

## 2015-01-05 MED ORDER — DIPHENHYDRAMINE HCL 25 MG PO CAPS: 50.0000 mg | ORAL_CAPSULE | ORAL | Status: DC

## 2015-01-05 MED ORDER — ACETAMINOPHEN 325 MG PO TABS: 650.0000 mg | ORAL_TABLET | ORAL | Status: DC

## 2015-01-05 MED ORDER — SODIUM CHLORIDE 0.9 % IV SOLN
750.0000 mg | INTRAVENOUS | Status: AC
  Administered 2015-01-05: 750 mg via INTRAVENOUS
  Filled 2015-01-05: qty 30

## 2015-01-24 ENCOUNTER — Telehealth (HOSPITAL_BASED_OUTPATIENT_CLINIC_OR_DEPARTMENT_OTHER): Payer: Self-pay | Admitting: Obstetrics & Gynecology

## 2015-01-24 ENCOUNTER — Encounter (HOSPITAL_BASED_OUTPATIENT_CLINIC_OR_DEPARTMENT_OTHER): Payer: Self-pay | Admitting: Obstetrics & Gynecology

## 2015-01-24 ENCOUNTER — Ambulatory Visit (HOSPITAL_BASED_OUTPATIENT_CLINIC_OR_DEPARTMENT_OTHER): Payer: Self-pay | Admitting: Obstetrics & Gynecology

## 2015-01-24 NOTE — Progress Notes (Signed)
Please send DNKA letter.

## 2015-01-29 ENCOUNTER — Other Ambulatory Visit (HOSPITAL_COMMUNITY): Payer: Self-pay | Admitting: *Deleted

## 2015-01-30 ENCOUNTER — Encounter (HOSPITAL_COMMUNITY)
Admission: RE | Admit: 2015-01-30 | Discharge: 2015-01-30 | Disposition: A | Discharge status: Home / Self Care | Payer: 59 | Source: Ambulatory Visit | Attending: Rheumatology | Admitting: Rheumatology

## 2015-01-30 DIAGNOSIS — M069 Rheumatoid arthritis, unspecified: Secondary | ICD-10-CM | POA: Diagnosis present

## 2015-01-30 MED ORDER — SODIUM CHLORIDE 0.9 % IV SOLN
INTRAVENOUS | Status: DC
  Administered 2015-01-30: 250 mL via INTRAVENOUS

## 2015-01-30 MED ORDER — SODIUM CHLORIDE 0.9 % IV SOLN
750.0000 mg | INTRAVENOUS | Status: DC
  Administered 2015-01-30: 750 mg via INTRAVENOUS
  Filled 2015-01-30: qty 30

## 2015-01-30 MED ORDER — DIPHENHYDRAMINE HCL 25 MG PO CAPS: 50.0000 mg | ORAL_CAPSULE | ORAL | Status: DC

## 2015-01-30 MED ORDER — ACETAMINOPHEN 325 MG PO TABS: 650.0000 mg | ORAL_TABLET | ORAL | Status: DC

## 2015-02-21 ENCOUNTER — Telehealth (HOSPITAL_BASED_OUTPATIENT_CLINIC_OR_DEPARTMENT_OTHER): Payer: Self-pay | Admitting: Ambulatory Care

## 2015-02-21 NOTE — Telephone Encounter (Signed)
-----   Message from Rodena Piety sent at 02/21/2015 10:30 AM EDT -----  Regarding: Utility letter  Cheryl Dixon [id number], 59 year old, female    Calls today:  Letters  Utility    Has patient received a utility letter from [place] before? Yes   Name of person requesting utility letter Cheryl Dixon  Name of utility company [employer]  Account number [id number]  [place] holder name Cheryl Dixon  Fax number [phone]   Home addressed confirmed Yes  +++Please Note: All information listed above is needed for each utility request, even if requested before+++  Person calling on behalf of patient: Patient (self)    Patient's language of care: English    Patient does not need an interpreter.    Patient's PCP: [name], MD

## 2015-02-21 NOTE — Progress Notes (Signed)
Utility letter written and left for PCP's approval  ,Nyzaiah Kai J. Torion Hulgan, RN, 02/21/2015, 10:46 AM

## 2015-02-21 NOTE — Progress Notes (Signed)
Letter signed and placed in out-box to be faxed.

## 2015-03-05 ENCOUNTER — Other Ambulatory Visit (HOSPITAL_COMMUNITY): Payer: Self-pay | Admitting: *Deleted

## 2015-03-06 ENCOUNTER — Encounter (HOSPITAL_COMMUNITY)
Admission: RE | Admit: 2015-03-06 | Discharge: 2015-03-06 | Disposition: A | Discharge status: Home / Self Care | Payer: 59 | Source: Ambulatory Visit | Attending: Rheumatology | Admitting: Rheumatology

## 2015-03-06 DIAGNOSIS — M069 Rheumatoid arthritis, unspecified: Secondary | ICD-10-CM | POA: Insufficient documentation

## 2015-03-06 LAB — DIFFERENTIAL
Basophils Absolute: 0 10*3/uL (ref 0.0–0.1)
Basophils Relative: 0 %
Eosinophils Absolute: 0.2 10*3/uL (ref 0.0–0.7)
Eosinophils Relative: 3 %
Lymphocytes Relative: 28 %
Lymphs Abs: 1.9 10*3/uL (ref 0.7–4.0)
Monocytes Absolute: 0.3 10*3/uL (ref 0.1–1.0)
Monocytes Relative: 4 %
Neutro Abs: 4.4 10*3/uL (ref 1.7–7.7)
Neutrophils Relative %: 65 %

## 2015-03-06 LAB — CBC
HCT: 38.2 % (ref 36.0–46.0)
Hemoglobin: 11.9 g/dL — ABNORMAL LOW (ref 12.0–15.0)
MCH: 25.1 pg — ABNORMAL LOW (ref 26.0–34.0)
MCHC: 31.2 g/dL (ref 30.0–36.0)
MCV: 80.4 fL (ref 78.0–100.0)
Platelets: 230 10*3/uL (ref 150–400)
RBC: 4.75 MIL/uL (ref 3.87–5.11)
RDW: 15.1 % (ref 11.5–15.5)
WBC: 6.8 10*3/uL (ref 4.0–10.5)

## 2015-03-06 LAB — COMPREHENSIVE METABOLIC PANEL
ALT: 42 U/L (ref 14–54)
AST: 41 U/L (ref 15–41)
Albumin: 3.3 g/dL — ABNORMAL LOW (ref 3.5–5.0)
Alkaline Phosphatase: 73 U/L (ref 38–126)
Anion gap: 9 (ref 5–15)
BUN: 12 mg/dL (ref 6–20)
CO2: 27 mmol/L (ref 22–32)
Calcium: 8.6 mg/dL — ABNORMAL LOW (ref 8.9–10.3)
Chloride: 103 mmol/L (ref 101–111)
Creatinine, Ser: 0.76 mg/dL (ref 0.44–1.00)
GFR calc Af Amer: >60 mL/min (ref >60)
GFR calc non Af Amer: >60 mL/min (ref >60)
Glucose, Bld: 123 mg/dL — ABNORMAL HIGH (ref 65–99)
Potassium: 3.8 mmol/L (ref 3.5–5.1)
Sodium: 139 mmol/L (ref 135–145)
Total Bilirubin: 0.6 mg/dL (ref 0.3–1.2)
Total Protein: 6.8 g/dL (ref 6.5–8.1)

## 2015-03-06 LAB — POCT HEMOGLOBIN-HEMACUE: Hemoglobin: 13.4 g/dL (ref 12.0–15.0)

## 2015-03-06 MED ORDER — ACETAMINOPHEN 325 MG PO TABS: 650.0000 mg | ORAL_TABLET | ORAL | Status: DC

## 2015-03-06 MED ORDER — SODIUM CHLORIDE 0.9 % IV SOLN
INTRAVENOUS | Status: DC
  Administered 2015-03-06: 11:00:00 via INTRAVENOUS

## 2015-03-06 MED ORDER — DIPHENHYDRAMINE HCL 25 MG PO CAPS: 50.0000 mg | ORAL_CAPSULE | ORAL | Status: DC

## 2015-03-06 MED ORDER — SODIUM CHLORIDE 0.9 % IV SOLN
750.0000 mg | INTRAVENOUS | Status: DC
  Administered 2015-03-06: 750 mg via INTRAVENOUS
  Filled 2015-03-06: qty 30

## 2015-03-08 ENCOUNTER — Telehealth (HOSPITAL_BASED_OUTPATIENT_CLINIC_OR_DEPARTMENT_OTHER): Payer: Self-pay | Admitting: Mental Health

## 2015-03-08 NOTE — Progress Notes (Signed)
Writer reached out to patient to update careplan.    Pt stopped taking prozac, taking bupropion and it's helping.     Patient wants to cut down, has already cut down significantly, if she smokes everyday she smokes 3-5 cigarettes a day. Would like Chantix    Unable to do the things she'd like to do, because breathing is hard.     Patient expresses she feels bored sometimes when shes alone, and methadone being cut down is giving her low energy, but other than that she is doing well.    Writer completed phq9 with patient.    Patient has significantly, improved, just wants to address her breathing, and to possibly quit smoking.

## 2015-03-13 ENCOUNTER — Other Ambulatory Visit: Payer: Self-pay | Admitting: Obstetrics and Gynecology

## 2015-03-13 DIAGNOSIS — N63 Unspecified lump in unspecified breast: Secondary | ICD-10-CM

## 2015-03-20 ENCOUNTER — Ambulatory Visit
Admission: RE | Admit: 2015-03-20 | Discharge: 2015-03-20 | Disposition: A | Discharge status: Home / Self Care | Payer: 59 | Source: Ambulatory Visit | Attending: Obstetrics and Gynecology | Admitting: Obstetrics and Gynecology

## 2015-03-20 DIAGNOSIS — N63 Unspecified lump in unspecified breast: Secondary | ICD-10-CM

## 2015-04-03 ENCOUNTER — Ambulatory Visit (HOSPITAL_COMMUNITY)
Admission: RE | Admit: 2015-04-03 | Discharge: 2015-04-03 | Disposition: A | Discharge status: Home / Self Care | Payer: 59 | Source: Ambulatory Visit | Attending: Rheumatology | Admitting: Rheumatology

## 2015-04-03 DIAGNOSIS — M069 Rheumatoid arthritis, unspecified: Secondary | ICD-10-CM | POA: Diagnosis present

## 2015-04-03 MED ORDER — ACETAMINOPHEN 325 MG PO TABS: 650.0000 mg | ORAL_TABLET | ORAL | Status: DC

## 2015-04-03 MED ORDER — SODIUM CHLORIDE 0.9 % IV SOLN
INTRAVENOUS | Status: DC
  Administered 2015-04-03: 10:00:00 via INTRAVENOUS

## 2015-04-03 MED ORDER — SODIUM CHLORIDE 0.9 % IV SOLN
750.0000 mg | INTRAVENOUS | Status: DC
  Administered 2015-04-03: 750 mg via INTRAVENOUS
  Filled 2015-04-03: qty 30

## 2015-04-03 MED ORDER — DIPHENHYDRAMINE HCL 25 MG PO CAPS: 50.0000 mg | ORAL_CAPSULE | ORAL | Status: DC

## 2015-04-27 ENCOUNTER — Other Ambulatory Visit (HOSPITAL_COMMUNITY): Payer: Self-pay | Admitting: *Deleted

## 2015-05-01 ENCOUNTER — Encounter (HOSPITAL_COMMUNITY)
Admission: RE | Admit: 2015-05-01 | Discharge: 2015-05-01 | Disposition: A | Discharge status: Home / Self Care | Payer: 59 | Source: Ambulatory Visit | Attending: Rheumatology | Admitting: Rheumatology

## 2015-05-01 DIAGNOSIS — M069 Rheumatoid arthritis, unspecified: Secondary | ICD-10-CM | POA: Insufficient documentation

## 2015-05-01 LAB — DIFFERENTIAL
Basophils Absolute: 0 K/uL (ref 0.0–0.1)
Basophils Relative: 0 %
Eosinophils Absolute: 0.2 K/uL (ref 0.0–0.7)
Eosinophils Relative: 3 %
Lymphocytes Relative: 31 %
Lymphs Abs: 2.1 K/uL (ref 0.7–4.0)
Monocytes Absolute: 0.3 K/uL (ref 0.1–1.0)
Monocytes Relative: 5 %
Neutro Abs: 4.1 K/uL (ref 1.7–7.7)
Neutrophils Relative %: 61 %

## 2015-05-01 LAB — CBC
HCT: 37.5 % (ref 36.0–46.0)
Hemoglobin: 11.7 g/dL — ABNORMAL LOW (ref 12.0–15.0)
MCH: 25.2 pg — ABNORMAL LOW (ref 26.0–34.0)
MCHC: 31.2 g/dL (ref 30.0–36.0)
MCV: 80.6 fL (ref 78.0–100.0)
Platelets: 208 10*3/uL (ref 150–400)
RBC: 4.65 MIL/uL (ref 3.87–5.11)
RDW: 14.5 % (ref 11.5–15.5)
WBC: 6.7 10*3/uL (ref 4.0–10.5)

## 2015-05-01 LAB — COMPREHENSIVE METABOLIC PANEL WITH GFR
ALT: 19 U/L (ref 14–54)
AST: 23 U/L (ref 15–41)
Albumin: 3.6 g/dL (ref 3.5–5.0)
Alkaline Phosphatase: 73 U/L (ref 38–126)
Anion gap: 9 (ref 5–15)
BUN: 16 mg/dL (ref 6–20)
CO2: 28 mmol/L (ref 22–32)
Calcium: 8.6 mg/dL — ABNORMAL LOW (ref 8.9–10.3)
Chloride: 102 mmol/L (ref 101–111)
Creatinine, Ser: 1.15 mg/dL — ABNORMAL HIGH (ref 0.44–1.00)
GFR calc Af Amer: >60 mL/min
GFR calc non Af Amer: 52 mL/min — ABNORMAL LOW
Glucose, Bld: 146 mg/dL — ABNORMAL HIGH (ref 65–99)
Potassium: 4 mmol/L (ref 3.5–5.1)
Sodium: 139 mmol/L (ref 135–145)
Total Bilirubin: 0.5 mg/dL (ref 0.3–1.2)
Total Protein: 7.1 g/dL (ref 6.5–8.1)

## 2015-05-01 LAB — VITAMIN B12: Vitamin B-12: 2761 pg/mL — ABNORMAL HIGH (ref 180–914)

## 2015-05-01 MED ORDER — DIPHENHYDRAMINE HCL 25 MG PO CAPS: 50.0000 mg | ORAL_CAPSULE | ORAL | Status: DC

## 2015-05-01 MED ORDER — ACETAMINOPHEN 325 MG PO TABS: 650.0000 mg | ORAL_TABLET | ORAL | Status: DC

## 2015-05-01 MED ORDER — SODIUM CHLORIDE 0.9 % IV SOLN
750.0000 mg | INTRAVENOUS | Status: AC
  Administered 2015-05-01: 750 mg via INTRAVENOUS
  Filled 2015-05-01: qty 30

## 2015-05-01 MED ORDER — SODIUM CHLORIDE 0.9 % IV SOLN
INTRAVENOUS | Status: DC
  Administered 2015-05-01: 09:00:00 via INTRAVENOUS

## 2015-05-02 LAB — VITAMIN D 25 HYDROXY (VIT D DEFICIENCY, FRACTURES): Vit D, 25-Hydroxy: 46.7 ng/mL (ref 30.0–100.0)

## 2015-05-29 ENCOUNTER — Ambulatory Visit (HOSPITAL_COMMUNITY)
Admission: RE | Admit: 2015-05-29 | Discharge: 2015-05-29 | Disposition: A | Discharge status: Home / Self Care | Payer: 59 | Source: Ambulatory Visit | Attending: Rheumatology | Admitting: Rheumatology

## 2015-05-29 DIAGNOSIS — M069 Rheumatoid arthritis, unspecified: Secondary | ICD-10-CM | POA: Insufficient documentation

## 2015-05-29 MED ORDER — SODIUM CHLORIDE 0.9 % IV SOLN
750.0000 mg | INTRAVENOUS | Status: AC
  Administered 2015-05-29: 750 mg via INTRAVENOUS
  Filled 2015-05-29: qty 30

## 2015-05-29 MED ORDER — SODIUM CHLORIDE 0.9 % IV SOLN
INTRAVENOUS | Status: DC
  Administered 2015-05-29: 10:00:00 via INTRAVENOUS

## 2015-05-29 MED ORDER — ACETAMINOPHEN 325 MG PO TABS: 650.0000 mg | ORAL_TABLET | ORAL | Status: DC

## 2015-05-29 MED ORDER — DIPHENHYDRAMINE HCL 25 MG PO CAPS: 50.0000 mg | ORAL_CAPSULE | ORAL | Status: DC

## 2015-06-25 ENCOUNTER — Other Ambulatory Visit (HOSPITAL_COMMUNITY): Payer: Self-pay | Admitting: *Deleted

## 2015-06-26 ENCOUNTER — Ambulatory Visit (HOSPITAL_COMMUNITY)
Admission: RE | Admit: 2015-06-26 | Discharge: 2015-06-26 | Disposition: A | Discharge status: Home / Self Care | Payer: 59 | Source: Ambulatory Visit | Attending: Rheumatology | Admitting: Rheumatology

## 2015-06-26 DIAGNOSIS — M069 Rheumatoid arthritis, unspecified: Secondary | ICD-10-CM | POA: Diagnosis present

## 2015-06-26 LAB — COMPREHENSIVE METABOLIC PANEL
ALT: 20 U/L (ref 14–54)
AST: 24 U/L (ref 15–41)
Albumin: 3.5 g/dL (ref 3.5–5.0)
Alkaline Phosphatase: 69 U/L (ref 38–126)
Anion gap: 9 (ref 5–15)
BUN: 13 mg/dL (ref 6–20)
CO2: 26 mmol/L (ref 22–32)
Calcium: 8.7 mg/dL — ABNORMAL LOW (ref 8.9–10.3)
Chloride: 106 mmol/L (ref 101–111)
Creatinine, Ser: 0.94 mg/dL (ref 0.44–1.00)
GFR calc Af Amer: >60 mL/min (ref >60)
GFR calc non Af Amer: >60 mL/min (ref >60)
Glucose, Bld: 87 mg/dL (ref 65–99)
Potassium: 4 mmol/L (ref 3.5–5.1)
Sodium: 141 mmol/L (ref 135–145)
Total Bilirubin: 0.3 mg/dL (ref 0.3–1.2)
Total Protein: 6.8 g/dL (ref 6.5–8.1)

## 2015-06-26 LAB — CBC WITH DIFFERENTIAL/PLATELET
Basophils Absolute: 0 10*3/uL (ref 0.0–0.1)
Basophils Relative: 0 %
Eosinophils Absolute: 0.2 10*3/uL (ref 0.0–0.7)
Eosinophils Relative: 3 %
HCT: 39.7 % (ref 36.0–46.0)
Hemoglobin: 12.6 g/dL (ref 12.0–15.0)
Lymphocytes Relative: 29 %
Lymphs Abs: 2.2 10*3/uL (ref 0.7–4.0)
MCH: 25.1 pg — ABNORMAL LOW (ref 26.0–34.0)
MCHC: 31.7 g/dL (ref 30.0–36.0)
MCV: 79.1 fL (ref 78.0–100.0)
Monocytes Absolute: 0.3 10*3/uL (ref 0.1–1.0)
Monocytes Relative: 4 %
Neutro Abs: 4.9 10*3/uL (ref 1.7–7.7)
Neutrophils Relative %: 64 %
Platelets: 214 10*3/uL (ref 150–400)
RBC: 5.02 MIL/uL (ref 3.87–5.11)
RDW: 14.6 % (ref 11.5–15.5)
WBC: 7.7 10*3/uL (ref 4.0–10.5)

## 2015-06-26 MED ORDER — ACETAMINOPHEN 325 MG PO TABS: 650.0000 mg | ORAL_TABLET | ORAL | Status: DC

## 2015-06-26 MED ORDER — SODIUM CHLORIDE 0.9 % IV SOLN
750.0000 mg | INTRAVENOUS | Status: DC
  Administered 2015-06-26: 750 mg via INTRAVENOUS
  Filled 2015-06-26: qty 30

## 2015-06-26 MED ORDER — SODIUM CHLORIDE 0.9 % IV SOLN
INTRAVENOUS | Status: DC
  Administered 2015-06-26: 10:00:00 via INTRAVENOUS

## 2015-06-26 MED ORDER — DIPHENHYDRAMINE HCL 25 MG PO TABS
25.0000 mg | ORAL_TABLET | ORAL | Status: DC
  Filled 2015-06-26: qty 1

## 2015-07-23 ENCOUNTER — Other Ambulatory Visit (HOSPITAL_COMMUNITY): Payer: Self-pay | Admitting: *Deleted

## 2015-07-24 ENCOUNTER — Ambulatory Visit (HOSPITAL_COMMUNITY)
Admission: RE | Admit: 2015-07-24 | Discharge: 2015-07-24 | Disposition: A | Discharge status: Home / Self Care | Payer: 59 | Source: Ambulatory Visit | Attending: Rheumatology | Admitting: Rheumatology

## 2015-07-24 ENCOUNTER — Encounter (HOSPITAL_COMMUNITY): Payer: 59

## 2015-07-24 DIAGNOSIS — M069 Rheumatoid arthritis, unspecified: Secondary | ICD-10-CM | POA: Diagnosis present

## 2015-07-24 DIAGNOSIS — Z79899 Other long term (current) drug therapy: Secondary | ICD-10-CM | POA: Diagnosis not present

## 2015-07-24 MED ORDER — ACETAMINOPHEN 325 MG PO TABS
650.0000 mg | ORAL_TABLET | ORAL | Status: DC
Start: 2015-07-24 — End: 2015-07-25

## 2015-07-24 MED ORDER — SODIUM CHLORIDE 0.9 % IV SOLN
INTRAVENOUS | Status: DC
Start: 2015-07-24 — End: 2015-07-25
  Administered 2015-07-24: 11:00:00 via INTRAVENOUS

## 2015-07-24 MED ORDER — SODIUM CHLORIDE 0.9 % IV SOLN
750.0000 mg | INTRAVENOUS | Status: DC
  Administered 2015-07-24: 750 mg via INTRAVENOUS
  Filled 2015-07-24: qty 30

## 2015-07-24 MED ORDER — DIPHENHYDRAMINE HCL 25 MG PO CAPS: 25.0000 mg | ORAL_CAPSULE | ORAL | Status: DC

## 2015-08-01 ENCOUNTER — Telehealth (HOSPITAL_BASED_OUTPATIENT_CLINIC_OR_DEPARTMENT_OTHER): Payer: Self-pay | Admitting: Ambulatory Care

## 2015-08-01 NOTE — Telephone Encounter (Signed)
-----   Message from Jerilee Field sent at 08/01/2015 11:39 AM EDT -----  Regarding: utility letter  Contact: [phone]  Radley Abramson [id number], 60 year old, female    Calls today requesting a letter for: Utilities   Name of Utility: [employer])  Name on Utility Account: Harsha Maloy  Utility Account Number: [id number]  Fax Number to send Letter: [phone]    Person calling on behalf of patient: Patient (self)    [place] NUMBER: [phone]    Patient's language of care: English    Patient does not need an interpreter.    Patient's PCP: [name], MD          Contact: [phone]  Joniah Nameth [id number], 60 year old, female, Telephone Information:  Home Phone [phone]  Work Phone Not on file.  Mobile [phone]  Home Phone Not on file.    [employer] [place] NUMBER: [phone]    Patient's language of care: English    Patient's PCP: [name], MD    Person calling on behalf of patient: Patient (self)    Calls today requesting a letter for: Utilities   Name of Utility: [employer])  Name on Utility Account: Lyndsi Ally  Utility Account Number: [id number]  Fax Number to send Letter: [phone]

## 2015-08-01 NOTE — Telephone Encounter (Signed)
-----   Message from Mickle Asper sent at 08/01/2015 11:12 AM EDT -----  Regarding: Requesting letter for electric   Contact: [phone]  Kerena Pizzola [id number], 60 year old, female    Calls today:  Clinical Questions ([place])    Name of person calling Patient   Specific nature of request Requesting a letter to [place] -- she did not have the fax number -- but states that this is something we should already have   Return phone number [phone]  Person calling on behalf of patient: Patient (self)    Patient's language of care: English    Patient does not need an interpreter.    Patient's PCP: [name], MD

## 2015-08-01 NOTE — Progress Notes (Signed)
Utility letter drafted and left for approval by provider  Will then be faxed and placed to be mailed to pt.  Erion Weightman J. Haig Prophet, RN, 08/01/2015, 12:01 PM          Letter signed by Prentiss Bells

## 2015-08-02 ENCOUNTER — Telehealth (HOSPITAL_BASED_OUTPATIENT_CLINIC_OR_DEPARTMENT_OTHER): Payer: Self-pay | Admitting: Ambulatory Care

## 2015-08-02 ENCOUNTER — Ambulatory Visit (HOSPITAL_BASED_OUTPATIENT_CLINIC_OR_DEPARTMENT_OTHER): Payer: Commercial Managed Care - HMO | Admitting: Physician Assistant
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
## 2015-08-02 NOTE — Telephone Encounter (Signed)
-----   Message from Rodena Piety sent at 08/02/2015  3:13 PM EDT -----  Regarding: patient calling re utility letter  Contact: [phone]  Jackquline Ireland [id number], 60 year old, female    Calls today: Clinical Questions ([place])   Name of person calling Patient  Patient very upset and crying on the phone, states she has been calling about electricity being off. States they did receive the letter, now there is another issue. They are saying she needs to prove she is receiving food stamp. States she has COPD and needs lights on. Wondering if RN could give her a call back asap.  Return phone number [phone]  Person calling on behalf of patient: Patient (self)    Patient's language of care: English    Patient does not need an interpreter.    Patient's PCP: [name], MD

## 2015-08-02 NOTE — Progress Notes (Signed)
Spoke with pt and told her letter was sent   She confirmed [employer] received the letter  Cheryl Dixon. Mignon Bechler, RN, 08/02/2015, 11:06 AM

## 2015-08-02 NOTE — Telephone Encounter (Signed)
-----   Message from [name] sent at 08/02/2015  3:29 PM EDT -----  Regarding: returning call  Contact: [place] [id number], 60 year old, female    Calls today:  Clinical Questions (NON-SICK CLINICAL QUESTIONS ONLY)    Specific nature of request returning call    Return phone number [phone]    Person calling on behalf of patient: Patient (self)    Patient's PCP: [name], MD

## 2015-08-02 NOTE — Progress Notes (Signed)
Call returned/placed to pt no answer, left message on VM to call clinic back at [place]. Haig Prophet, RN, 08/02/2015, 3:24 PM
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
## 2015-08-21 ENCOUNTER — Encounter (HOSPITAL_COMMUNITY): Payer: 59

## 2015-08-27 ENCOUNTER — Other Ambulatory Visit (HOSPITAL_COMMUNITY): Payer: Self-pay | Admitting: *Deleted

## 2015-08-28 ENCOUNTER — Ambulatory Visit (HOSPITAL_COMMUNITY)
Admission: RE | Admit: 2015-08-28 | Discharge: 2015-08-28 | Disposition: A | Discharge status: Home / Self Care | Payer: 59 | Source: Ambulatory Visit | Attending: Rheumatology | Admitting: Rheumatology

## 2015-08-28 DIAGNOSIS — M069 Rheumatoid arthritis, unspecified: Secondary | ICD-10-CM | POA: Diagnosis present

## 2015-08-28 LAB — CBC
HCT: 40.5 % (ref 36.0–46.0)
Hemoglobin: 12.6 g/dL (ref 12.0–15.0)
MCH: 24.6 pg — ABNORMAL LOW (ref 26.0–34.0)
MCHC: 31.1 g/dL (ref 30.0–36.0)
MCV: 78.9 fL (ref 78.0–100.0)
Platelets: 285 10*3/uL (ref 150–400)
RBC: 5.13 MIL/uL — ABNORMAL HIGH (ref 3.87–5.11)
RDW: 15 % (ref 11.5–15.5)
WBC: 7.9 10*3/uL (ref 4.0–10.5)

## 2015-08-28 LAB — COMPREHENSIVE METABOLIC PANEL
ALT: 22 U/L (ref 14–54)
AST: 65 U/L — ABNORMAL HIGH (ref 15–41)
Albumin: 3.6 g/dL (ref 3.5–5.0)
Alkaline Phosphatase: 72 U/L (ref 38–126)
Anion gap: 12 (ref 5–15)
BUN: 15 mg/dL (ref 6–20)
CO2: 25 mmol/L (ref 22–32)
Calcium: 8.6 mg/dL — ABNORMAL LOW (ref 8.9–10.3)
Chloride: 103 mmol/L (ref 101–111)
Creatinine, Ser: 0.92 mg/dL (ref 0.44–1.00)
GFR calc Af Amer: >60 mL/min (ref >60)
GFR calc non Af Amer: >60 mL/min (ref >60)
Glucose, Bld: 118 mg/dL — ABNORMAL HIGH (ref 65–99)
Potassium: 4.9 mmol/L (ref 3.5–5.1)
Sodium: 140 mmol/L (ref 135–145)
Total Bilirubin: 1.2 mg/dL (ref 0.3–1.2)
Total Protein: 7.2 g/dL (ref 6.5–8.1)

## 2015-08-28 LAB — DIFFERENTIAL
Basophils Absolute: 0 10*3/uL (ref 0.0–0.1)
Basophils Relative: 0 %
Eosinophils Absolute: 0.2 10*3/uL (ref 0.0–0.7)
Eosinophils Relative: 2 %
Lymphocytes Relative: 32 %
Lymphs Abs: 2.5 10*3/uL (ref 0.7–4.0)
Monocytes Absolute: 0.6 10*3/uL (ref 0.1–1.0)
Monocytes Relative: 8 %
Neutro Abs: 4.5 10*3/uL (ref 1.7–7.7)
Neutrophils Relative %: 58 %

## 2015-08-28 MED ORDER — SODIUM CHLORIDE 0.9 % IV SOLN
INTRAVENOUS | Status: DC
  Administered 2015-08-28: 09:00:00 via INTRAVENOUS

## 2015-08-28 MED ORDER — DIPHENHYDRAMINE HCL 25 MG PO CAPS: 25.0000 mg | ORAL_CAPSULE | ORAL | Status: DC

## 2015-08-28 MED ORDER — ACETAMINOPHEN 325 MG PO TABS: 650.0000 mg | ORAL_TABLET | ORAL | Status: DC

## 2015-08-28 MED ORDER — SODIUM CHLORIDE 0.9 % IV SOLN
750.0000 mg | INTRAVENOUS | Status: DC
  Administered 2015-08-28: 750 mg via INTRAVENOUS
  Filled 2015-08-28: qty 30

## 2015-09-21 ENCOUNTER — Other Ambulatory Visit (HOSPITAL_COMMUNITY): Payer: Self-pay | Admitting: *Deleted

## 2015-09-25 ENCOUNTER — Ambulatory Visit (HOSPITAL_COMMUNITY): Admission: RE | Admit: 2015-09-25 | Payer: 59 | Source: Ambulatory Visit

## 2015-10-01 ENCOUNTER — Other Ambulatory Visit (HOSPITAL_COMMUNITY): Payer: Self-pay | Admitting: *Deleted

## 2015-10-02 ENCOUNTER — Ambulatory Visit (HOSPITAL_COMMUNITY)
Admission: RE | Admit: 2015-10-02 | Discharge: 2015-10-02 | Disposition: A | Discharge status: Home / Self Care | Payer: 59 | Source: Ambulatory Visit | Attending: Rheumatology | Admitting: Rheumatology

## 2015-10-02 DIAGNOSIS — M069 Rheumatoid arthritis, unspecified: Secondary | ICD-10-CM | POA: Diagnosis not present

## 2015-10-02 MED ORDER — DIPHENHYDRAMINE HCL 25 MG PO CAPS: 25.0000 mg | ORAL_CAPSULE | ORAL | Status: DC

## 2015-10-02 MED ORDER — SODIUM CHLORIDE 0.9 % IV SOLN
INTRAVENOUS | Status: DC
  Administered 2015-10-02: 10:00:00 via INTRAVENOUS

## 2015-10-02 MED ORDER — ACETAMINOPHEN 325 MG PO TABS: 650.0000 mg | ORAL_TABLET | ORAL | Status: DC

## 2015-10-02 MED ORDER — SODIUM CHLORIDE 0.9 % IV SOLN
750.0000 mg | INTRAVENOUS | Status: DC
  Administered 2015-10-02: 750 mg via INTRAVENOUS
  Filled 2015-10-02: qty 30

## 2015-10-04 ENCOUNTER — Ambulatory Visit (HOSPITAL_BASED_OUTPATIENT_CLINIC_OR_DEPARTMENT_OTHER): Payer: Commercial Managed Care - HMO | Admitting: Internal Medicine

## 2015-10-26 ENCOUNTER — Other Ambulatory Visit (HOSPITAL_COMMUNITY): Payer: Self-pay | Admitting: *Deleted

## 2015-10-29 ENCOUNTER — Ambulatory Visit (HOSPITAL_COMMUNITY): Payer: 59

## 2015-11-05 ENCOUNTER — Ambulatory Visit (HOSPITAL_COMMUNITY)
Admission: RE | Admit: 2015-11-05 | Discharge: 2015-11-05 | Disposition: A | Discharge status: Home / Self Care | Payer: 59 | Source: Ambulatory Visit | Attending: Rheumatology | Admitting: Rheumatology

## 2015-11-05 DIAGNOSIS — M0579 Rheumatoid arthritis with rheumatoid factor of multiple sites without organ or systems involvement: Secondary | ICD-10-CM | POA: Diagnosis present

## 2015-11-05 LAB — COMPREHENSIVE METABOLIC PANEL
ALT: 22 U/L (ref 14–54)
AST: 32 U/L (ref 15–41)
Albumin: 3.5 g/dL (ref 3.5–5.0)
Alkaline Phosphatase: 72 U/L (ref 38–126)
Anion gap: 8 (ref 5–15)
BUN: 14 mg/dL (ref 6–20)
CO2: 26 mmol/L (ref 22–32)
Calcium: 8.6 mg/dL — ABNORMAL LOW (ref 8.9–10.3)
Chloride: 104 mmol/L (ref 101–111)
Creatinine, Ser: 0.84 mg/dL (ref 0.44–1.00)
GFR calc Af Amer: >60 mL/min (ref >60)
GFR calc non Af Amer: >60 mL/min (ref >60)
Glucose, Bld: 144 mg/dL — ABNORMAL HIGH (ref 65–99)
Potassium: 4.3 mmol/L (ref 3.5–5.1)
Sodium: 138 mmol/L (ref 135–145)
Total Bilirubin: 0.6 mg/dL (ref 0.3–1.2)
Total Protein: 6.8 g/dL (ref 6.5–8.1)

## 2015-11-05 LAB — CBC
HCT: 38.9 % (ref 36.0–46.0)
Hemoglobin: 12.2 g/dL (ref 12.0–15.0)
MCH: 24.7 pg — ABNORMAL LOW (ref 26.0–34.0)
MCHC: 31.4 g/dL (ref 30.0–36.0)
MCV: 78.7 fL (ref 78.0–100.0)
Platelets: 239 10*3/uL (ref 150–400)
RBC: 4.94 MIL/uL (ref 3.87–5.11)
RDW: 14.9 % (ref 11.5–15.5)
WBC: 7.8 10*3/uL (ref 4.0–10.5)

## 2015-11-05 LAB — DIFFERENTIAL
Basophils Absolute: 0 10*3/uL (ref 0.0–0.1)
Basophils Relative: 0 %
Eosinophils Absolute: 0.3 10*3/uL (ref 0.0–0.7)
Eosinophils Relative: 3 %
Lymphocytes Relative: 30 %
Lymphs Abs: 2.3 10*3/uL (ref 0.7–4.0)
Monocytes Absolute: 0.4 10*3/uL (ref 0.1–1.0)
Monocytes Relative: 5 %
Neutro Abs: 4.8 10*3/uL (ref 1.7–7.7)
Neutrophils Relative %: 62 %

## 2015-11-05 MED ORDER — ACETAMINOPHEN 325 MG PO TABS: 650.0000 mg | ORAL_TABLET | ORAL | Status: DC

## 2015-11-05 MED ORDER — DIPHENHYDRAMINE HCL 25 MG PO CAPS: 25.0000 mg | ORAL_CAPSULE | ORAL | Status: DC

## 2015-11-05 MED ORDER — SODIUM CHLORIDE 0.9 % IV SOLN
750.0000 mg | INTRAVENOUS | Status: DC
  Administered 2015-11-05: 750 mg via INTRAVENOUS
  Filled 2015-11-05: qty 30

## 2015-11-05 MED ORDER — SODIUM CHLORIDE 0.9 % IV SOLN
INTRAVENOUS | Status: DC
  Administered 2015-11-05: 09:00:00 via INTRAVENOUS

## 2015-12-03 ENCOUNTER — Other Ambulatory Visit (HOSPITAL_COMMUNITY): Payer: Self-pay | Admitting: *Deleted

## 2015-12-04 ENCOUNTER — Ambulatory Visit (HOSPITAL_COMMUNITY)
Admission: RE | Admit: 2015-12-04 | Discharge: 2015-12-04 | Disposition: A | Discharge status: Home / Self Care | Payer: 59 | Source: Ambulatory Visit | Attending: Rheumatology | Admitting: Rheumatology

## 2015-12-04 DIAGNOSIS — M0579 Rheumatoid arthritis with rheumatoid factor of multiple sites without organ or systems involvement: Secondary | ICD-10-CM | POA: Diagnosis present

## 2015-12-04 MED ORDER — SODIUM CHLORIDE 0.9 % IV SOLN
750.0000 mg | INTRAVENOUS | Status: DC
  Administered 2015-12-04: 750 mg via INTRAVENOUS
  Filled 2015-12-04: qty 30

## 2015-12-04 MED ORDER — ACETAMINOPHEN 325 MG PO TABS: 650.0000 mg | ORAL_TABLET | ORAL | Status: DC

## 2015-12-04 MED ORDER — SODIUM CHLORIDE 0.9 % IV SOLN: 750.0000 mg | INTRAVENOUS | Status: DC

## 2015-12-04 MED ORDER — SODIUM CHLORIDE 0.9 % IV SOLN
INTRAVENOUS | Status: DC
  Administered 2015-12-04: 10:00:00 via INTRAVENOUS

## 2015-12-04 MED ORDER — DIPHENHYDRAMINE HCL 25 MG PO TABS: 25.0000 mg | ORAL_TABLET | ORAL | Status: DC

## 2015-12-04 MED ORDER — SODIUM CHLORIDE 0.9 % IV SOLN: INTRAVENOUS | Status: DC

## 2015-12-04 MED ORDER — DIPHENHYDRAMINE HCL 25 MG PO CAPS: 25.0000 mg | ORAL_CAPSULE | ORAL | Status: DC

## 2015-12-27 ENCOUNTER — Other Ambulatory Visit (HOSPITAL_BASED_OUTPATIENT_CLINIC_OR_DEPARTMENT_OTHER): Payer: Self-pay

## 2015-12-27 NOTE — Telephone Encounter (Signed)
This team member called Patient (self) to improve patient engagement for a physical exam, for a breast cancer screening (mammogram) and for a colorectral screening (colonoscopy or iFOB).  No interpreter needed.    Patient (self) answered, but hung up as soon as I introduced myself. I called back and patient picked up and hung up.    Gianna Jubilee Vivero, 12/27/2015, 9:42 AM

## 2015-12-31 DIAGNOSIS — M0579 Rheumatoid arthritis with rheumatoid factor of multiple sites without organ or systems involvement: Secondary | ICD-10-CM

## 2016-01-01 ENCOUNTER — Encounter (HOSPITAL_COMMUNITY): Payer: 59

## 2016-01-08 ENCOUNTER — Ambulatory Visit (HOSPITAL_COMMUNITY)
Admission: RE | Admit: 2016-01-08 | Discharge: 2016-01-08 | Disposition: A | Discharge status: Home / Self Care | Payer: 59 | Source: Ambulatory Visit | Attending: Rheumatology | Admitting: Rheumatology

## 2016-01-08 DIAGNOSIS — M0579 Rheumatoid arthritis with rheumatoid factor of multiple sites without organ or systems involvement: Secondary | ICD-10-CM | POA: Diagnosis present

## 2016-01-08 LAB — CBC WITH DIFFERENTIAL/PLATELET
Basophils Absolute: 0 10*3/uL (ref 0.0–0.1)
Basophils Relative: 0 %
Eosinophils Absolute: 0.2 10*3/uL (ref 0.0–0.7)
Eosinophils Relative: 2 %
HCT: 40.7 % (ref 36.0–46.0)
Hemoglobin: 12.5 g/dL (ref 12.0–15.0)
Lymphocytes Relative: 35 %
Lymphs Abs: 3.2 10*3/uL (ref 0.7–4.0)
MCH: 23.9 pg — ABNORMAL LOW (ref 26.0–34.0)
MCHC: 30.7 g/dL (ref 30.0–36.0)
MCV: 77.8 fL — ABNORMAL LOW (ref 78.0–100.0)
Monocytes Absolute: 0.3 10*3/uL (ref 0.1–1.0)
Monocytes Relative: 3 %
Neutro Abs: 5.4 10*3/uL (ref 1.7–7.7)
Neutrophils Relative %: 60 %
Platelets: 248 10*3/uL (ref 150–400)
RBC: 5.23 MIL/uL — ABNORMAL HIGH (ref 3.87–5.11)
RDW: 14.9 % (ref 11.5–15.5)
WBC: 9.1 10*3/uL (ref 4.0–10.5)

## 2016-01-08 LAB — COMPREHENSIVE METABOLIC PANEL
ALT: 22 U/L (ref 14–54)
AST: 32 U/L (ref 15–41)
Albumin: 3.7 g/dL (ref 3.5–5.0)
Alkaline Phosphatase: 71 U/L (ref 38–126)
Anion gap: 12 (ref 5–15)
BUN: 14 mg/dL (ref 6–20)
CO2: 24 mmol/L (ref 22–32)
Calcium: 8.7 mg/dL — ABNORMAL LOW (ref 8.9–10.3)
Chloride: 103 mmol/L (ref 101–111)
Creatinine, Ser: 0.92 mg/dL (ref 0.44–1.00)
GFR calc Af Amer: >60 mL/min (ref >60)
GFR calc non Af Amer: >60 mL/min (ref >60)
Glucose, Bld: 182 mg/dL — ABNORMAL HIGH (ref 65–99)
Potassium: 4 mmol/L (ref 3.5–5.1)
Sodium: 139 mmol/L (ref 135–145)
Total Bilirubin: 0.6 mg/dL (ref 0.3–1.2)
Total Protein: 7 g/dL (ref 6.5–8.1)

## 2016-01-08 MED ORDER — SODIUM CHLORIDE 0.9 % IV SOLN
750.0000 mg | INTRAVENOUS | Status: DC
  Administered 2016-01-08: 750 mg via INTRAVENOUS
  Filled 2016-01-08: qty 20

## 2016-01-08 MED ORDER — DIPHENHYDRAMINE HCL 25 MG PO CAPS
25.0000 mg | ORAL_CAPSULE | Freq: Once | ORAL | Status: AC
  Administered 2016-01-08: 25 mg via ORAL

## 2016-01-08 MED ORDER — SODIUM CHLORIDE 0.9 % IV SOLN
INTRAVENOUS | Status: DC
  Administered 2016-01-08: 10:00:00 via INTRAVENOUS

## 2016-01-08 MED ORDER — ACETAMINOPHEN 325 MG PO TABS
ORAL_TABLET | ORAL | Status: AC
  Filled 2016-01-08: qty 2

## 2016-01-08 MED ORDER — ACETAMINOPHEN 325 MG PO TABS
650.0000 mg | ORAL_TABLET | ORAL | Status: DC
  Administered 2016-01-08: 650 mg via ORAL

## 2016-01-08 MED ORDER — DIPHENHYDRAMINE HCL 25 MG PO CAPS
ORAL_CAPSULE | ORAL | Status: AC
  Filled 2016-01-08: qty 1

## 2016-01-08 NOTE — Progress Notes (Signed)
Pt tolerated orencia well, VSS at DC, PT DC home without complaint

## 2016-02-04 ENCOUNTER — Ambulatory Visit (HOSPITAL_COMMUNITY)
Admission: RE | Admit: 2016-02-04 | Discharge: 2016-02-04 | Disposition: A | Discharge status: Home / Self Care | Payer: 59 | Source: Ambulatory Visit | Attending: Rheumatology | Admitting: Rheumatology

## 2016-02-04 DIAGNOSIS — M0579 Rheumatoid arthritis with rheumatoid factor of multiple sites without organ or systems involvement: Secondary | ICD-10-CM | POA: Diagnosis not present

## 2016-02-04 MED ORDER — SODIUM CHLORIDE 0.9 % IV SOLN
750.0000 mg | INTRAVENOUS | Status: DC
  Administered 2016-02-04: 750 mg via INTRAVENOUS
  Filled 2016-02-04: qty 20

## 2016-02-04 MED ORDER — SODIUM CHLORIDE 0.9 % IV SOLN
INTRAVENOUS | Status: DC
Start: 2016-02-05 — End: 2016-02-05
  Administered 2016-02-04: 09:00:00 via INTRAVENOUS

## 2016-02-04 MED ORDER — DIPHENHYDRAMINE HCL 25 MG PO CAPS: 25.0000 mg | ORAL_CAPSULE | Freq: Once | ORAL | Status: DC

## 2016-02-04 MED ORDER — ACETAMINOPHEN 325 MG PO TABS: 650.0000 mg | ORAL_TABLET | ORAL | Status: DC

## 2016-02-04 MED ORDER — SODIUM CHLORIDE 0.9 % IV SOLN
750.0000 mg | INTRAVENOUS | Status: DC
  Filled 2016-02-04: qty 30

## 2016-02-07 ENCOUNTER — Ambulatory Visit (INDEPENDENT_AMBULATORY_CARE_PROVIDER_SITE_OTHER): Payer: 59 | Admitting: Rheumatology

## 2016-02-07 DIAGNOSIS — M0579 Rheumatoid arthritis with rheumatoid factor of multiple sites without organ or systems involvement: Secondary | ICD-10-CM | POA: Diagnosis not present

## 2016-02-07 DIAGNOSIS — M797 Fibromyalgia: Secondary | ICD-10-CM

## 2016-02-07 DIAGNOSIS — M545 Low back pain: Secondary | ICD-10-CM | POA: Diagnosis not present

## 2016-02-07 DIAGNOSIS — G4709 Other insomnia: Secondary | ICD-10-CM

## 2016-02-07 DIAGNOSIS — Z79899 Other long term (current) drug therapy: Secondary | ICD-10-CM

## 2016-02-07 DIAGNOSIS — R5381 Other malaise: Secondary | ICD-10-CM

## 2016-02-08 ENCOUNTER — Encounter (HOSPITAL_BASED_OUTPATIENT_CLINIC_OR_DEPARTMENT_OTHER): Payer: Self-pay | Admitting: Physician Assistant

## 2016-02-21 ENCOUNTER — Other Ambulatory Visit: Payer: Self-pay | Admitting: Radiology

## 2016-02-21 DIAGNOSIS — M0579 Rheumatoid arthritis with rheumatoid factor of multiple sites without organ or systems involvement: Secondary | ICD-10-CM

## 2016-02-24 ENCOUNTER — Other Ambulatory Visit: Payer: Self-pay | Admitting: Radiology

## 2016-02-24 DIAGNOSIS — Z79899 Other long term (current) drug therapy: Secondary | ICD-10-CM

## 2016-02-24 DIAGNOSIS — M0579 Rheumatoid arthritis with rheumatoid factor of multiple sites without organ or systems involvement: Secondary | ICD-10-CM

## 2016-02-24 MED ORDER — SODIUM CHLORIDE 0.9 % IV SOLN: 750.0000 mg | INTRAVENOUS | Status: DC

## 2016-02-24 MED ORDER — ACETAMINOPHEN 325 MG PO TABS: 650.0000 mg | ORAL_TABLET | Freq: Once | ORAL | Status: DC

## 2016-02-24 MED ORDER — DIPHENHYDRAMINE HCL 25 MG PO CAPS: 25.0000 mg | ORAL_CAPSULE | Freq: Once | ORAL | Status: DC

## 2016-02-25 ENCOUNTER — Encounter (HOSPITAL_COMMUNITY): Payer: 59

## 2016-02-28 ENCOUNTER — Ambulatory Visit (HOSPITAL_BASED_OUTPATIENT_CLINIC_OR_DEPARTMENT_OTHER): Payer: Commercial Managed Care - HMO | Admitting: Internal Medicine

## 2016-03-04 ENCOUNTER — Ambulatory Visit (HOSPITAL_BASED_OUTPATIENT_CLINIC_OR_DEPARTMENT_OTHER): Payer: Commercial Managed Care - HMO | Admitting: Internal Medicine

## 2016-03-04 ENCOUNTER — Ambulatory Visit (HOSPITAL_COMMUNITY)
Admission: RE | Admit: 2016-03-04 | Discharge: 2016-03-04 | Disposition: A | Discharge status: Home / Self Care | Payer: 59 | Source: Ambulatory Visit | Attending: Rheumatology | Admitting: Rheumatology

## 2016-03-04 DIAGNOSIS — M0579 Rheumatoid arthritis with rheumatoid factor of multiple sites without organ or systems involvement: Secondary | ICD-10-CM | POA: Insufficient documentation

## 2016-03-04 MED ORDER — SODIUM CHLORIDE 0.9 % IV SOLN
750.0000 mg | INTRAVENOUS | Status: DC
  Administered 2016-03-04: 750 mg via INTRAVENOUS
  Filled 2016-03-04: qty 10

## 2016-03-18 ENCOUNTER — Telehealth: Payer: Self-pay | Admitting: Rheumatology

## 2016-03-18 NOTE — Telephone Encounter (Signed)
Patient has a gland tumor and will need surgery on December 11th. Patient needs to know what to do about her infusion that is schedule on 12/09. Please call patient back at [phone], not available between 12-1

## 2016-03-18 NOTE — Telephone Encounter (Signed)
Phone conversation with patient. I have advised her to hold the Orencia infusion for 4 weeks  prior to surgery, and not to resume for 4 weeks after. She must get clearance from her surgeon prior to resuming the infusions, and she must be healing well with no signs of infection. Patient has voiced understanding   To you FYI

## 2016-03-19 ENCOUNTER — Emergency Department (HOSPITAL_BASED_OUTPATIENT_CLINIC_OR_DEPARTMENT_OTHER)
Admission: RE | Admit: 2016-03-19 | Disposition: A | Discharge status: Home / Self Care | Payer: Self-pay | Source: Emergency Department | Attending: Emergency Medicine | Admitting: Emergency Medicine

## 2016-03-19 ENCOUNTER — Encounter (HOSPITAL_BASED_OUTPATIENT_CLINIC_OR_DEPARTMENT_OTHER): Payer: Self-pay

## 2016-03-19 MED ORDER — CLONIDINE HCL 0.1 MG PO TABS
0.20 mg | ORAL_TABLET | Freq: Once | ORAL | Status: AC
Start: 2016-03-19 — End: 2016-03-19
  Administered 2016-03-19: 0.2 mg via ORAL
  Filled 2016-03-19: qty 2

## 2016-03-19 MED ORDER — DICYCLOMINE HCL 10 MG PO CAPS
10.00 mg | ORAL_CAPSULE | Freq: Once | ORAL | Status: AC
Start: 2016-03-19 — End: 2016-03-19
  Administered 2016-03-19: 10 mg via ORAL
  Filled 2016-03-19: qty 1
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
## 2016-03-19 NOTE — ED Triage Note (Signed)
Pt reports feeling like her skin is crawling, sweating, thinks she is having methadone withdrawal.  Pt took methadone today, has been on a taper for the past few weeks.  Pt states she smoked marijuana today and took 2 citalopram by accident thinking it was ibuprofen.  Pt arrives extremely anxious but cooperative.  Denies SI/HI.

## 2016-03-26 ENCOUNTER — Telehealth (HOSPITAL_BASED_OUTPATIENT_CLINIC_OR_DEPARTMENT_OTHER): Payer: Self-pay | Admitting: Registered Nurse

## 2016-03-26 ENCOUNTER — Other Ambulatory Visit: Payer: Self-pay | Admitting: Radiology

## 2016-03-26 NOTE — Progress Notes (Signed)
Left VM for pt to return call.   Lannie Fields, RN, 03/26/2016, 12:10 PM

## 2016-03-28 ENCOUNTER — Ambulatory Visit (HOSPITAL_BASED_OUTPATIENT_CLINIC_OR_DEPARTMENT_OTHER): Payer: Commercial Managed Care - HMO | Admitting: Internal Medicine

## 2016-03-31 ENCOUNTER — Ambulatory Visit: Payer: Self-pay | Admitting: Otolaryngology

## 2016-03-31 NOTE — H&P (Signed)
PREOPERATIVE H&P  Chief Complaint: slowly enlarging left parotid mass  HPI: Michelle Roberts is a 60 y.o. female who presents for evaluation of slowly enlarging left parotid mass she has noticed for about 5 years. On exam she has a firm mobile 2.5 cm left parotid mass just in front of the ear lobe. She has normal facial nerve function. She's taken to the OR for left superficial parotidectomy and facial nerve dissection.  No past medical history on file. No past surgical history on file. Social History   Social History  . Marital status: Married    Spouse name: N/A  . Number of children: N/A  . Years of education: N/A   Social History Main Topics  . Smoking status: Never Smoker  . Smokeless tobacco: Never Used  . Alcohol use No  . Drug use: No  . Sexual activity: Not on file   Other Topics Concern  . Not on file   Social History Narrative  . No narrative on file   No family history on file. Allergies  Allergen Reactions  . Epinephrine    Prior to Admission medications   Medication Sig Start Date End Date Taking? Authorizing Provider  amitriptyline (ELAVIL) 25 MG tablet  06/27/13   Historical Provider, MD  antipyrine-benzocaine Toniann Fail) otic solution every 2 (two) hours as needed for ear pain.    Historical Provider, MD  aspirin 81 MG tablet Take 81 mg by mouth daily.    Historical Provider, MD  atorvastatin (LIPITOR) 40 MG tablet  06/20/13   Historical Provider, MD  Calcium Carbonate-Vit D-Min (CALCIUM 1200 PO) Take by mouth.    Historical Provider, MD  cholecalciferol (VITAMIN D) 1000 UNITS tablet Take 1,000 Units by mouth daily.    Historical Provider, MD  Coenzyme Q10 (CO Q-10) 30 MG CAPS Take by mouth.    Historical Provider, MD  diphenhydrAMINE (BENADRYL) 25 mg capsule Take 25 mg by mouth every 6 (six) hours as needed. One hour prior to infusion    Historical Provider, MD  FABB 2.2-25-1 MG TABS  06/26/13   Historical Provider, MD  fexofenadine (ALLEGRA) 180 MG tablet Take  180 mg by mouth daily.    Historical Provider, MD  gabapentin (NEURONTIN) 300 MG capsule  07/04/13   Historical Provider, MD  ibuprofen (ADVIL,MOTRIN) 800 MG tablet  07/22/13   Historical Provider, MD  Lactobacillus (ACIDOPHILUS PO) Take by mouth.    Historical Provider, MD  Magnesium Malate POWD by Does not apply route.    Historical Provider, MD  pantoprazole (PROTONIX) 40 MG tablet  06/20/13   Historical Provider, MD  predniSONE (STERAPRED UNI-PAK) 5 MG TABS tablet  04/19/13   Historical Provider, MD  SYNTHROID 150 MCG tablet  06/26/13   Historical Provider, MD  Thiamine HCl (B-1) 100 MG TABS Take by mouth.    Historical Provider, MD  triamterene-hydrochlorothiazide Bradd Burner) 37.5-25 MG per tablet  06/20/13   Historical Provider, MD  VAGIFEM 10 MCG TABS vaginal tablet  06/26/13   Historical Provider, MD  zolpidem (AMBIEN) 5 MG tablet  05/17/13   Historical Provider, MD     Positive ROS: Negative  All other systems have been reviewed and were otherwise negative with the exception of those mentioned in the HPI and as above.  Physical Exam: There were no vitals filed for this visit.  General: Alert, no acute distress Oral: Normal oral mucosa and tonsils Nasal: Clear nasal passages Neck: No palpable adenopathy or thyroid nodules. 2.5 cm firm left parotid mass  Ear: Ear canal is clear with normal appearing TMs Cardiovascular: Regular rate and rhythm, no murmur.  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

## 2016-04-01 ENCOUNTER — Encounter (HOSPITAL_COMMUNITY): Payer: 59

## 2016-04-03 ENCOUNTER — Encounter (HOSPITAL_BASED_OUTPATIENT_CLINIC_OR_DEPARTMENT_OTHER): Payer: Self-pay | Admitting: *Deleted

## 2016-04-04 ENCOUNTER — Other Ambulatory Visit: Payer: Self-pay

## 2016-04-04 ENCOUNTER — Encounter (HOSPITAL_BASED_OUTPATIENT_CLINIC_OR_DEPARTMENT_OTHER)
Admission: RE | Admit: 2016-04-04 | Discharge: 2016-04-04 | Disposition: A | Discharge status: Home / Self Care | Payer: 59 | Source: Ambulatory Visit | Attending: Otolaryngology | Admitting: Otolaryngology

## 2016-04-04 DIAGNOSIS — Z01810 Encounter for preprocedural cardiovascular examination: Secondary | ICD-10-CM | POA: Insufficient documentation

## 2016-04-04 DIAGNOSIS — Z01812 Encounter for preprocedural laboratory examination: Secondary | ICD-10-CM | POA: Diagnosis not present

## 2016-04-04 LAB — BASIC METABOLIC PANEL
Anion gap: 9 (ref 5–15)
BUN: 15 mg/dL (ref 6–20)
CO2: 28 mmol/L (ref 22–32)
Calcium: 8.4 mg/dL — ABNORMAL LOW (ref 8.9–10.3)
Chloride: 102 mmol/L (ref 101–111)
Creatinine, Ser: 0.85 mg/dL (ref 0.44–1.00)
GFR calc Af Amer: >60 mL/min (ref >60)
GFR calc non Af Amer: >60 mL/min (ref >60)
Glucose, Bld: 142 mg/dL — ABNORMAL HIGH (ref 65–99)
Potassium: 3.6 mmol/L (ref 3.5–5.1)
Sodium: 139 mmol/L (ref 135–145)

## 2016-04-08 ENCOUNTER — Encounter (HOSPITAL_BASED_OUTPATIENT_CLINIC_OR_DEPARTMENT_OTHER): Payer: Self-pay | Admitting: Anesthesiology

## 2016-04-08 ENCOUNTER — Ambulatory Visit (HOSPITAL_BASED_OUTPATIENT_CLINIC_OR_DEPARTMENT_OTHER): Payer: 59 | Admitting: Anesthesiology

## 2016-04-08 ENCOUNTER — Ambulatory Visit (HOSPITAL_BASED_OUTPATIENT_CLINIC_OR_DEPARTMENT_OTHER)
Admission: RE | Admit: 2016-04-08 | Discharge: 2016-04-09 | Disposition: A | Discharge status: Home / Self Care | Payer: 59 | Source: Ambulatory Visit | Attending: Otolaryngology | Admitting: Otolaryngology

## 2016-04-08 ENCOUNTER — Encounter (HOSPITAL_BASED_OUTPATIENT_CLINIC_OR_DEPARTMENT_OTHER)
Admission: RE | Disposition: A | Discharge status: Home / Self Care | Payer: Self-pay | Source: Ambulatory Visit | Attending: Otolaryngology

## 2016-04-08 DIAGNOSIS — C07 Malignant neoplasm of parotid gland: Secondary | ICD-10-CM | POA: Insufficient documentation

## 2016-04-08 DIAGNOSIS — Z7984 Long term (current) use of oral hypoglycemic drugs: Secondary | ICD-10-CM | POA: Diagnosis not present

## 2016-04-08 DIAGNOSIS — G4730 Sleep apnea, unspecified: Secondary | ICD-10-CM | POA: Insufficient documentation

## 2016-04-08 DIAGNOSIS — K219 Gastro-esophageal reflux disease without esophagitis: Secondary | ICD-10-CM | POA: Diagnosis not present

## 2016-04-08 DIAGNOSIS — F418 Other specified anxiety disorders: Secondary | ICD-10-CM | POA: Diagnosis not present

## 2016-04-08 DIAGNOSIS — Z79899 Other long term (current) drug therapy: Secondary | ICD-10-CM | POA: Insufficient documentation

## 2016-04-08 DIAGNOSIS — M797 Fibromyalgia: Secondary | ICD-10-CM | POA: Insufficient documentation

## 2016-04-08 DIAGNOSIS — E039 Hypothyroidism, unspecified: Secondary | ICD-10-CM | POA: Diagnosis not present

## 2016-04-08 DIAGNOSIS — Z7982 Long term (current) use of aspirin: Secondary | ICD-10-CM | POA: Insufficient documentation

## 2016-04-08 DIAGNOSIS — I10 Essential (primary) hypertension: Secondary | ICD-10-CM | POA: Diagnosis not present

## 2016-04-08 DIAGNOSIS — E119 Type 2 diabetes mellitus without complications: Secondary | ICD-10-CM | POA: Insufficient documentation

## 2016-04-08 DIAGNOSIS — Z6841 Body Mass Index (BMI) 40.0 and over, adult: Secondary | ICD-10-CM | POA: Insufficient documentation

## 2016-04-08 DIAGNOSIS — Z85850 Personal history of malignant neoplasm of thyroid: Secondary | ICD-10-CM | POA: Insufficient documentation

## 2016-04-08 DIAGNOSIS — D37030 Neoplasm of uncertain behavior of the parotid salivary glands: Secondary | ICD-10-CM | POA: Diagnosis present

## 2016-04-08 HISTORY — DX: Fibromyalgia: M79.7

## 2016-04-08 HISTORY — DX: Hypothyroidism, unspecified: E03.9

## 2016-04-08 HISTORY — DX: Sleep apnea, unspecified: G47.30

## 2016-04-08 HISTORY — PX: PAROTIDECTOMY: SHX2163

## 2016-04-08 HISTORY — DX: Depression, unspecified: F32.A

## 2016-04-08 HISTORY — DX: Hyperlipidemia, unspecified: E78.5

## 2016-04-08 HISTORY — DX: Essential (primary) hypertension: I10

## 2016-04-08 HISTORY — DX: Anxiety disorder, unspecified: F41.9

## 2016-04-08 HISTORY — DX: Unspecified osteoarthritis, unspecified site: M19.90

## 2016-04-08 HISTORY — DX: Myoneural disorder, unspecified: G70.9

## 2016-04-08 HISTORY — DX: Major depressive disorder, single episode, unspecified: F32.9

## 2016-04-08 HISTORY — DX: Gastro-esophageal reflux disease without esophagitis: K21.9

## 2016-04-08 HISTORY — DX: Neoplasm of unspecified behavior of digestive system: D49.0

## 2016-04-08 LAB — GLUCOSE, CAPILLARY
Glucose-Capillary: 128 mg/dL — ABNORMAL HIGH (ref 65–99)
Glucose-Capillary: 164 mg/dL — ABNORMAL HIGH (ref 65–99)

## 2016-04-08 SURGERY — EXCISION, PAROTID GLAND
Anesthesia: General | Site: Neck | Laterality: Left

## 2016-04-08 MED ORDER — ZOLPIDEM TARTRATE 5 MG PO TABS
5.0000 mg | ORAL_TABLET | Freq: Every day | ORAL | Status: DC
  Administered 2016-04-08: 5 mg via ORAL

## 2016-04-08 MED ORDER — FENTANYL CITRATE (PF) 100 MCG/2ML IJ SOLN: 50.0000 ug | INTRAMUSCULAR | Status: DC | PRN

## 2016-04-08 MED ORDER — HYDROCODONE-ACETAMINOPHEN 5-325 MG PO TABS
1.0000 | ORAL_TABLET | ORAL | Status: DC | PRN
  Administered 2016-04-08 – 2016-04-09 (×5): 2 via ORAL
  Filled 2016-04-08 (×5): qty 2

## 2016-04-08 MED ORDER — BUPIVACAINE-EPINEPHRINE 0.25% -1:200000 IJ SOLN
INTRAMUSCULAR | Status: DC | PRN
Start: 2016-04-08 — End: 2016-04-08
  Administered 2016-04-08: 6 mL

## 2016-04-08 MED ORDER — KCL IN DEXTROSE-NACL 20-5-0.45 MEQ/L-%-% IV SOLN
INTRAVENOUS | Status: DC
  Administered 2016-04-08: 13:00:00 via INTRAVENOUS
  Filled 2016-04-08: qty 1000

## 2016-04-08 MED ORDER — DEXAMETHASONE SODIUM PHOSPHATE 4 MG/ML IJ SOLN
INTRAMUSCULAR | Status: DC | PRN
  Administered 2016-04-08: 10 mg via INTRAVENOUS

## 2016-04-08 MED ORDER — BUPIVACAINE-EPINEPHRINE (PF) 0.25% -1:200000 IJ SOLN
INTRAMUSCULAR | Status: AC
  Filled 2016-04-08: qty 30

## 2016-04-08 MED ORDER — EPINEPHRINE 30 MG/30ML IJ SOLN
INTRAMUSCULAR | Status: AC
  Filled 2016-04-08: qty 1

## 2016-04-08 MED ORDER — LIDOCAINE HCL (CARDIAC) 20 MG/ML IV SOLN
INTRAVENOUS | Status: DC | PRN
  Administered 2016-04-08: 80 mg via INTRAVENOUS
  Administered 2016-04-08: 30 mg via INTRAVENOUS

## 2016-04-08 MED ORDER — SCOPOLAMINE 1 MG/3DAYS TD PT72
MEDICATED_PATCH | TRANSDERMAL | Status: AC
  Filled 2016-04-08: qty 1

## 2016-04-08 MED ORDER — PANTOPRAZOLE SODIUM 40 MG PO TBEC
40.0000 mg | DELAYED_RELEASE_TABLET | Freq: Every day | ORAL | Status: DC
  Administered 2016-04-08: 40 mg via ORAL

## 2016-04-08 MED ORDER — SUCCINYLCHOLINE CHLORIDE 20 MG/ML IJ SOLN
INTRAMUSCULAR | Status: DC | PRN
  Administered 2016-04-08: 120 mg via INTRAVENOUS

## 2016-04-08 MED ORDER — PROPOFOL 10 MG/ML IV BOLUS
INTRAVENOUS | Status: AC
  Filled 2016-04-08: qty 20

## 2016-04-08 MED ORDER — CEFAZOLIN SODIUM-DEXTROSE 2-4 GM/100ML-% IV SOLN
2.0000 g | INTRAVENOUS | Status: AC
  Administered 2016-04-08: 2 g via INTRAVENOUS

## 2016-04-08 MED ORDER — PHENYLEPHRINE HCL 10 MG/ML IJ SOLN
INTRAMUSCULAR | Status: DC | PRN
  Administered 2016-04-08 (×2): 80 ug via INTRAVENOUS

## 2016-04-08 MED ORDER — MIDAZOLAM HCL 2 MG/2ML IJ SOLN
INTRAMUSCULAR | Status: AC
  Filled 2016-04-08: qty 2

## 2016-04-08 MED ORDER — SUFENTANIL CITRATE 50 MCG/ML IV SOLN
INTRAVENOUS | Status: DC | PRN
  Administered 2016-04-08: 30 ug via INTRAVENOUS
  Administered 2016-04-08 (×2): 5 ug via INTRAVENOUS

## 2016-04-08 MED ORDER — FENTANYL CITRATE (PF) 100 MCG/2ML IJ SOLN
INTRAMUSCULAR | Status: AC
  Filled 2016-04-08: qty 2

## 2016-04-08 MED ORDER — MIDAZOLAM HCL 2 MG/2ML IJ SOLN
1.0000 mg | INTRAMUSCULAR | Status: DC | PRN
  Administered 2016-04-08: 2 mg via INTRAVENOUS

## 2016-04-08 MED ORDER — EPHEDRINE 5 MG/ML INJ
INTRAVENOUS | Status: AC
  Filled 2016-04-08: qty 10

## 2016-04-08 MED ORDER — BACITRACIN ZINC 500 UNIT/GM EX OINT
TOPICAL_OINTMENT | CUTANEOUS | Status: AC
  Filled 2016-04-08: qty 28.35

## 2016-04-08 MED ORDER — MEPERIDINE HCL 25 MG/ML IJ SOLN: 6.2500 mg | INTRAMUSCULAR | Status: DC | PRN

## 2016-04-08 MED ORDER — SCOPOLAMINE 1 MG/3DAYS TD PT72: 1.0000 | MEDICATED_PATCH | TRANSDERMAL | Status: DC

## 2016-04-08 MED ORDER — ONDANSETRON HCL 4 MG/2ML IJ SOLN
INTRAMUSCULAR | Status: AC
  Filled 2016-04-08: qty 2

## 2016-04-08 MED ORDER — TRIAMTERENE-HCTZ 37.5-25 MG PO TABS: 1.0000 | ORAL_TABLET | Freq: Every day | ORAL | Status: DC

## 2016-04-08 MED ORDER — TRAMADOL HCL 50 MG PO TABS: 50.0000 mg | ORAL_TABLET | Freq: Two times a day (BID) | ORAL | Status: DC | PRN

## 2016-04-08 MED ORDER — GABAPENTIN 300 MG PO CAPS
300.0000 mg | ORAL_CAPSULE | Freq: Two times a day (BID) | ORAL | Status: DC
Start: 2016-04-08 — End: 2016-04-09
  Administered 2016-04-08 (×2): 300 mg via ORAL

## 2016-04-08 MED ORDER — PHENYLEPHRINE 40 MCG/ML (10ML) SYRINGE FOR IV PUSH (FOR BLOOD PRESSURE SUPPORT)
PREFILLED_SYRINGE | INTRAVENOUS | Status: AC
  Filled 2016-04-08: qty 10

## 2016-04-08 MED ORDER — AMITRIPTYLINE HCL 25 MG PO TABS
25.0000 mg | ORAL_TABLET | Freq: Every evening | ORAL | Status: DC | PRN
  Administered 2016-04-08: 25 mg via ORAL

## 2016-04-08 MED ORDER — DEXAMETHASONE SODIUM PHOSPHATE 10 MG/ML IJ SOLN
INTRAMUSCULAR | Status: AC
  Filled 2016-04-08: qty 1

## 2016-04-08 MED ORDER — IBUPROFEN 100 MG/5ML PO SUSP: 400.0000 mg | Freq: Four times a day (QID) | ORAL | Status: DC | PRN

## 2016-04-08 MED ORDER — PHENOL 1.4 % MT LIQD
1.0000 | OROMUCOSAL | Status: DC | PRN
  Administered 2016-04-08: 1 via OROMUCOSAL
  Filled 2016-04-08: qty 177

## 2016-04-08 MED ORDER — ATROPINE SULFATE 0.4 MG/ML IV SOSY
PREFILLED_SYRINGE | INTRAVENOUS | Status: AC
  Filled 2016-04-08: qty 2.5

## 2016-04-08 MED ORDER — FENTANYL CITRATE (PF) 100 MCG/2ML IJ SOLN
25.0000 ug | INTRAMUSCULAR | Status: DC | PRN
  Administered 2016-04-08 (×2): 50 ug via INTRAVENOUS

## 2016-04-08 MED ORDER — CEFAZOLIN IN D5W 1 GM/50ML IV SOLN
1.0000 g | Freq: Three times a day (TID) | INTRAVENOUS | Status: DC
  Administered 2016-04-08 – 2016-04-09 (×2): 1 g via INTRAVENOUS
  Filled 2016-04-08 (×2): qty 50

## 2016-04-08 MED ORDER — METOCLOPRAMIDE HCL 5 MG/ML IJ SOLN: 10.0000 mg | Freq: Once | INTRAMUSCULAR | Status: DC | PRN

## 2016-04-08 MED ORDER — OXYCODONE HCL 5 MG/5ML PO SOLN: 5.0000 mg | Freq: Once | ORAL | Status: DC | PRN

## 2016-04-08 MED ORDER — SUCCINYLCHOLINE CHLORIDE 200 MG/10ML IV SOSY
PREFILLED_SYRINGE | INTRAVENOUS | Status: AC
  Filled 2016-04-08: qty 10

## 2016-04-08 MED ORDER — SUFENTANIL CITRATE 50 MCG/ML IV SOLN
INTRAVENOUS | Status: AC
  Filled 2016-04-08: qty 1

## 2016-04-08 MED ORDER — IBUPROFEN 800 MG PO TABS
800.0000 mg | ORAL_TABLET | Freq: Four times a day (QID) | ORAL | Status: DC | PRN
  Administered 2016-04-08: 800 mg via ORAL
  Filled 2016-04-08: qty 4

## 2016-04-08 MED ORDER — ARTIFICIAL TEARS OP OINT
TOPICAL_OINTMENT | OPHTHALMIC | Status: AC
  Filled 2016-04-08: qty 3.5

## 2016-04-08 MED ORDER — ROCURONIUM BROMIDE 10 MG/ML (PF) SYRINGE
PREFILLED_SYRINGE | INTRAVENOUS | Status: AC
  Filled 2016-04-08: qty 10

## 2016-04-08 MED ORDER — LACTATED RINGERS IV SOLN
INTRAVENOUS | Status: DC
  Administered 2016-04-08 (×3): via INTRAVENOUS

## 2016-04-08 MED ORDER — CEFAZOLIN SODIUM-DEXTROSE 2-4 GM/100ML-% IV SOLN
INTRAVENOUS | Status: AC
  Filled 2016-04-08: qty 100

## 2016-04-08 MED ORDER — BACITRACIN 500 UNIT/GM EX OINT
TOPICAL_OINTMENT | CUTANEOUS | Status: DC | PRN
  Administered 2016-04-08: 1 via TOPICAL

## 2016-04-08 MED ORDER — LEVOTHYROXINE SODIUM 150 MCG PO TABS: 150.0000 ug | ORAL_TABLET | Freq: Every day | ORAL | Status: DC

## 2016-04-08 MED ORDER — SCOPOLAMINE 1 MG/3DAYS TD PT72
1.0000 | MEDICATED_PATCH | Freq: Once | TRANSDERMAL | Status: AC | PRN
  Administered 2016-04-08: 1 via TRANSDERMAL

## 2016-04-08 MED ORDER — OXYCODONE HCL 5 MG PO TABS: 5.0000 mg | ORAL_TABLET | Freq: Once | ORAL | Status: DC | PRN

## 2016-04-08 MED ORDER — ONDANSETRON HCL 4 MG/2ML IJ SOLN
INTRAMUSCULAR | Status: DC | PRN
Start: 2016-04-08 — End: 2016-04-08
  Administered 2016-04-08: 4 mg via INTRAVENOUS

## 2016-04-08 MED ORDER — LIDOCAINE 2% (20 MG/ML) 5 ML SYRINGE
INTRAMUSCULAR | Status: AC
  Filled 2016-04-08: qty 5

## 2016-04-08 MED ORDER — METFORMIN HCL 500 MG PO TABS
500.0000 mg | ORAL_TABLET | Freq: Two times a day (BID) | ORAL | Status: DC
  Administered 2016-04-08: 500 mg via ORAL

## 2016-04-08 MED ORDER — PROPOFOL 10 MG/ML IV BOLUS
INTRAVENOUS | Status: DC | PRN
  Administered 2016-04-08: 200 mg via INTRAVENOUS
  Administered 2016-04-08: 40 mg via INTRAVENOUS

## 2016-04-08 MED ORDER — LIDOCAINE HCL (PF) 1 % IJ SOLN
INTRAMUSCULAR | Status: AC
  Filled 2016-04-08: qty 30

## 2016-04-08 SURGICAL SUPPLY — 75 items
ADH SKN CLS APL DERMABOND .7 (GAUZE/BANDAGES/DRESSINGS)
APL SKNCLS STERI-STRIP NONHPOA (GAUZE/BANDAGES/DRESSINGS)
APPLICATOR COTTON TIP 6IN STRL (MISCELLANEOUS) ×3 IMPLANT
ATTRACTOMAT 16X20 MAGNETIC DRP (DRAPES) ×3 IMPLANT
BALL CTTN LRG ABS STRL LF (GAUZE/BANDAGES/DRESSINGS) ×1
BENZOIN TINCTURE PRP APPL 2/3 (GAUZE/BANDAGES/DRESSINGS) IMPLANT
BLADE SURG 12 STRL SS (BLADE) ×3 IMPLANT
BLADE SURG 15 STRL LF DISP TIS (BLADE) ×1 IMPLANT
BLADE SURG 15 STRL SS (BLADE) ×3
CANISTER SUCT 1200ML W/VALVE (MISCELLANEOUS) ×3 IMPLANT
CLEANER CAUTERY TIP 5X5 PAD (MISCELLANEOUS) IMPLANT
CLOSURE WOUND 1/2 X4 (GAUZE/BANDAGES/DRESSINGS)
CORDS BIPOLAR (ELECTRODE) ×3 IMPLANT
COTTONBALL LRG STERILE PKG (GAUZE/BANDAGES/DRESSINGS) ×3 IMPLANT
COVER BACK TABLE 60X90IN (DRAPES) ×3 IMPLANT
COVER MAYO STAND STRL (DRAPES) ×3 IMPLANT
DECANTER SPIKE VIAL GLASS SM (MISCELLANEOUS) ×1 IMPLANT
DERMABOND ADVANCED (GAUZE/BANDAGES/DRESSINGS)
DERMABOND ADVANCED .7 DNX12 (GAUZE/BANDAGES/DRESSINGS) IMPLANT
DRAIN JP 10F RND SILICONE (MISCELLANEOUS) ×2 IMPLANT
DRAIN PENROSE 1/4X12 LTX STRL (WOUND CARE) IMPLANT
DRAPE SURG 17X23 STRL (DRAPES) ×3 IMPLANT
DRAPE U-SHAPE 76X120 STRL (DRAPES) ×3 IMPLANT
ELECT COATED BLADE 2.86 ST (ELECTRODE) ×3 IMPLANT
ELECT REM PT RETURN 9FT ADLT (ELECTROSURGICAL) ×3
ELECTRODE REM PT RTRN 9FT ADLT (ELECTROSURGICAL) ×1 IMPLANT
EVACUATOR SILICONE 100CC (DRAIN) ×2 IMPLANT
FORCEPS BIPOLAR SPETZLER 8 1.0 (NEUROSURGERY SUPPLIES) ×2 IMPLANT
GAUZE SPONGE 4X4 16PLY XRAY LF (GAUZE/BANDAGES/DRESSINGS) ×2 IMPLANT
GLOVE EXAM NITRILE EXT CUFF MD (GLOVE) ×2 IMPLANT
GLOVE SS BIOGEL STRL SZ 7.5 (GLOVE) ×1 IMPLANT
GLOVE SUPERSENSE BIOGEL SZ 7.5 (GLOVE) ×4
GLOVE SURG SS PI 7.0 STRL IVOR (GLOVE) ×2 IMPLANT
GLOVE SURG SS PI 7.5 STRL IVOR (GLOVE) IMPLANT
GOWN STRL REUS W/ TWL LRG LVL3 (GOWN DISPOSABLE) ×2 IMPLANT
GOWN STRL REUS W/ TWL XL LVL3 (GOWN DISPOSABLE) ×1 IMPLANT
GOWN STRL REUS W/TWL LRG LVL3 (GOWN DISPOSABLE) ×3
GOWN STRL REUS W/TWL XL LVL3 (GOWN DISPOSABLE) ×3
LOCATOR NERVE 3 VOLT (DISPOSABLE) ×2 IMPLANT
NDL HYPO 25X1 1.5 SAFETY (NEEDLE) ×1 IMPLANT
NEEDLE HYPO 25X1 1.5 SAFETY (NEEDLE) ×3 IMPLANT
NS IRRIG 1000ML POUR BTL (IV SOLUTION) ×3 IMPLANT
PACK BASIN DAY SURGERY FS (CUSTOM PROCEDURE TRAY) ×3 IMPLANT
PAD CLEANER CAUTERY TIP 5X5 (MISCELLANEOUS)
PENCIL BUTTON HOLSTER BLD 10FT (ELECTRODE) ×3 IMPLANT
PIN SAFETY STERILE (MISCELLANEOUS) ×2 IMPLANT
SHEET MEDIUM DRAPE 40X70 STRL (DRAPES) IMPLANT
SLEEVE SCD COMPRESS KNEE MED (MISCELLANEOUS) ×3 IMPLANT
SPONGE GAUZE 4X4 12PLY STER LF (GAUZE/BANDAGES/DRESSINGS) ×4 IMPLANT
SPONGE INTESTINAL PEANUT (DISPOSABLE) IMPLANT
STAPLER VISISTAT 35W (STAPLE) IMPLANT
STRIP CLOSURE SKIN 1/2X4 (GAUZE/BANDAGES/DRESSINGS) IMPLANT
SUCTION FRAZIER HANDLE 10FR (MISCELLANEOUS)
SUCTION TUBE FRAZIER 10FR DISP (MISCELLANEOUS) IMPLANT
SUT CHROMIC 3 0 PS 2 (SUTURE) ×3 IMPLANT
SUT ETHILON 3 0 PS 1 (SUTURE) ×3 IMPLANT
SUT ETHILON 4 0 PS 2 18 (SUTURE) IMPLANT
SUT ETHILON 5 0 P 3 18 (SUTURE) ×2
SUT NYLON ETHILON 5-0 P-3 1X18 (SUTURE) IMPLANT
SUT SILK 2 0 FS (SUTURE) ×3 IMPLANT
SUT SILK 2 0 TIES 17X18 (SUTURE) ×3
SUT SILK 2-0 18XBRD TIE BLK (SUTURE) ×1 IMPLANT
SUT SILK 3 0 PS 1 (SUTURE) ×2 IMPLANT
SUT SILK 3 0 SH 30 (SUTURE) IMPLANT
SUT SILK 3 0 TIES 17X18 (SUTURE)
SUT SILK 3-0 18XBRD TIE BLK (SUTURE) IMPLANT
SUT SILK 4 0 TIES 17X18 (SUTURE) ×3 IMPLANT
SWAB COLLECTION DEVICE MRSA (MISCELLANEOUS) IMPLANT
SWAB CULTURE ESWAB REG 1ML (MISCELLANEOUS) IMPLANT
SYR BULB 3OZ (MISCELLANEOUS) ×3 IMPLANT
SYR CONTROL 10ML LL (SYRINGE) ×3 IMPLANT
TOWEL OR 17X24 6PK STRL BLUE (TOWEL DISPOSABLE) ×6 IMPLANT
TRAY DSU PREP LF (CUSTOM PROCEDURE TRAY) ×3 IMPLANT
TUBE CONNECTING 20'X1/4 (TUBING) ×1
TUBE CONNECTING 20X1/4 (TUBING) ×2 IMPLANT

## 2016-04-08 NOTE — Anesthesia Procedure Notes (Addendum)
Procedure Name: Intubation Date/Time: 04/08/2016 8:12 AM Performed by: Melynda Ripple D Pre-anesthesia Checklist: Patient identified, Emergency Drugs available, Suction available and Patient being monitored Patient Re-evaluated:Patient Re-evaluated prior to inductionOxygen Delivery Method: Circle system utilized Preoxygenation: Pre-oxygenation with 100% oxygen Intubation Type: IV induction Ventilation: Mask ventilation without difficulty Laryngoscope Size: 3 and Glidescope Grade View: Grade III Tube type: Oral Number of attempts: 1 Airway Equipment and Method: Stylet and Oral airway Placement Confirmation: ETT inserted through vocal cords under direct vision,  positive ETCO2 and breath sounds checked- equal and bilateral Secured at: 20 cm Tube secured with: Tape Dental Injury: Teeth and Oropharynx as per pre-operative assessment  Difficulty Due To: Difficulty was anticipated, Difficult Airway- due to large tongue and Difficult Airway- due to anterior larynx

## 2016-04-08 NOTE — Anesthesia Preprocedure Evaluation (Addendum)
Anesthesia Evaluation  Patient identified by MRN, date of birth, ID band Patient awake    Reviewed: Allergy & Precautions, NPO status , Patient's Chart, lab work & pertinent test results  History of Anesthesia Complications (+) PONV and history of anesthetic complications  Airway Mallampati: III  TM Distance: >3 FB Neck ROM: Full    Dental  (+) Partial Upper   Pulmonary sleep apnea and Continuous Positive Airway Pressure Ventilation ,  Non compliant with CPAP due to Parotid lesion   Pulmonary exam normal breath sounds clear to auscultation       Cardiovascular hypertension, Pt. on medications Normal cardiovascular exam Rhythm:Regular Rate:Normal     Neuro/Psych PSYCHIATRIC DISORDERS Anxiety Depression  Neuromuscular disease    GI/Hepatic Neg liver ROS, GERD  Medicated and Controlled,Neoplasm left parotid gland   Endo/Other  diabetes, Well Controlled, Type 2, Oral Hypoglycemic AgentsHypothyroidism Morbid obesityHyperlipidemia  Renal/GU negative Renal ROS  negative genitourinary   Musculoskeletal  (+) Arthritis , Rheumatoid disorders,  Fibromyalgia -  Abdominal (+) + obese,   Peds  Hematology   Anesthesia Other Findings   Reproductive/Obstetrics                            Lab Results  Component Value Date   WBC 9.1 01/08/2016   HGB 12.5 01/08/2016   HCT 40.7 01/08/2016   MCV 77.8 (L) 01/08/2016   PLT 248 01/08/2016     Chemistry      Component Value Date/Time   NA 139 04/04/2016 0830   K 3.6 04/04/2016 0830   CL 102 04/04/2016 0830   CO2 28 04/04/2016 0830   BUN 15 04/04/2016 0830   CREATININE 0.85 04/04/2016 0830      Component Value Date/Time   CALCIUM 8.4 (L) 04/04/2016 0830   ALKPHOS 71 01/08/2016 0930   AST 32 01/08/2016 0930   ALT 22 01/08/2016 0930   BILITOT 0.6 01/08/2016 0930     EKG: normal sinus rhythm, possible LAE.  Anesthesia Physical Anesthesia  Plan  ASA: III  Anesthesia Plan: General   Post-op Pain Management:    Induction: Intravenous and Cricoid pressure planned  Airway Management Planned: Oral ETT  Additional Equipment:   Intra-op Plan:   Post-operative Plan: Extubation in OR  Informed Consent: I have reviewed the patients History and Physical, chart, labs and discussed the procedure including the risks, benefits and alternatives for the proposed anesthesia with the patient or authorized representative who has indicated his/her understanding and acceptance.   Dental advisory given  Plan Discussed with: Anesthesiologist, CRNA and Surgeon  Anesthesia Plan Comments:        Anesthesia Quick Evaluation

## 2016-04-08 NOTE — Anesthesia Postprocedure Evaluation (Signed)
Anesthesia Post Note  Patient: Michelle Roberts  Procedure(s) Performed: Procedure(s) (LRB): LEFT SUPERFICIAL PAROTIDECTOMY WITH FACIAL NERVE DISECTION (Left)  Patient location during evaluation: PACU Anesthesia Type: General Level of consciousness: awake and alert and oriented Pain management: pain level controlled Vital Signs Assessment: post-procedure vital signs reviewed and stable Respiratory status: spontaneous breathing, nonlabored ventilation and respiratory function stable Cardiovascular status: blood pressure returned to baseline and stable Postop Assessment: no signs of nausea or vomiting Anesthetic complications: no    Last Vitals:  Vitals:   04/08/16 1200 04/08/16 1215  BP: 116/77 127/66  Pulse: (!) 101 100  Resp: 20 13  Temp:      Last Pain:  Vitals:   04/08/16 1215  TempSrc:   PainSc: 5                  Dennice Tindol A.

## 2016-04-08 NOTE — Brief Op Note (Signed)
04/08/2016  11:20 AM  PATIENT:  Cherlyn Cushing  60 y.o. female  PRE-OPERATIVE DIAGNOSIS:  Neoplasm of Parotid salivary gland  POST-OPERATIVE DIAGNOSIS:  Neoplasm of Parotid salivary gland  PROCEDURE:  Procedure(s): LEFT SUPERFICIAL PAROTIDECTOMY WITH FACIAL NERVE DISECTION (Left)  SURGEON:  Surgeon(s) and Role:    * Rozetta Nunnery, MD - Primary  PHYSICIAN ASSISTANT:   ASSISTANTS: none   ANESTHESIA:   none  EBL:  Total I/O In: 1400 [I.V.:1400] Out: 15 [Blood:15]  BLOOD ADMINISTERED:none  DRAINS: (10) Jackson-Pratt drain(s) with closed bulb suction in the left papotid region   LOCAL MEDICATIONS USED:  MARCAINE with epi 6 cc    SPECIMEN:  Source of Specimen:  left parotid gland  DISPOSITION OF SPECIMEN:  PATHOLOGY  COUNTS:  YES  TOURNIQUET:  * No tourniquets in log *  DICTATION: .Other Dictation: Dictation Number [id number]  PLAN OF CARE: Admit for overnight observation  PATIENT DISPOSITION:  PACU - hemodynamically stable.   Delay start of Pharmacological VTE agent (>24hrs) due to surgical blood loss or risk of bleeding: yes

## 2016-04-08 NOTE — Progress Notes (Signed)
Post Op check Awake alert. Complains of some visual changes in the left eye but able to see OK PERRLA   EOMI Normal facial nerve function JP minimal serosanguinous output 10 cc but just emptied Parotid wound with minimal swelling Stable post op Will remove JP in am and discharge home

## 2016-04-08 NOTE — Transfer of Care (Signed)
Immediate Anesthesia Transfer of Care Note  Patient: Michelle Roberts  Procedure(s) Performed: Procedure(s): LEFT SUPERFICIAL PAROTIDECTOMY WITH FACIAL NERVE DISECTION (Left)  Patient Location: PACU  Anesthesia Type:General  Level of Consciousness: awake, alert  and oriented  Airway & Oxygen Therapy: Patient Spontanous Breathing and Patient connected to face mask oxygen  Post-op Assessment: Report given to RN and Post -op Vital signs reviewed and stable  Post vital signs: Reviewed and stable  Last Vitals:  Vitals:   04/08/16 0721  BP: (!) 153/73  Pulse: 94  Resp: 18  Temp: 36.8 C    Last Pain:  Vitals:   04/08/16 0721  TempSrc: Oral      Patients Stated Pain Goal: 0 ([id number])  Complications: No apparent anesthesia complications

## 2016-04-08 NOTE — Interval H&P Note (Signed)
History and Physical Interval Note:  04/08/2016 7:52 AM  Michelle Roberts  has presented today for surgery, with the diagnosis of NEOPLASM  OF PAROLTID SALIVARY GLAND D37.030  The various methods of treatment have been discussed with the patient and family. After consideration of risks, benefits and other options for treatment, the patient has consented to  Procedure(s): LEFT SUPERFICIAL PAROTIDECTOMY WITH FACIAL NERVE DISECTION (Left) as a surgical intervention .  The patient's history has been reviewed, patient examined, no change in status, stable for surgery.  I have reviewed the patient's chart and labs.  Questions were answered to the patient's satisfaction.     CHRISTOPHER NEWMAN

## 2016-04-09 ENCOUNTER — Encounter (HOSPITAL_BASED_OUTPATIENT_CLINIC_OR_DEPARTMENT_OTHER): Payer: Self-pay | Admitting: Otolaryngology

## 2016-04-09 ENCOUNTER — Telehealth: Payer: Self-pay | Admitting: *Deleted

## 2016-04-09 DIAGNOSIS — C07 Malignant neoplasm of parotid gland: Secondary | ICD-10-CM | POA: Diagnosis not present

## 2016-04-09 MED ORDER — BENZONATATE 100 MG PO CAPS: 200.0000 mg | ORAL_CAPSULE | Freq: Two times a day (BID) | ORAL | 0 refills | Status: DC

## 2016-04-09 MED ORDER — BACITRACIN ZINC 500 UNIT/GM EX OINT
TOPICAL_OINTMENT | CUTANEOUS | Status: AC
  Filled 2016-04-09: qty 0.9

## 2016-04-09 MED ORDER — HYDROCODONE-ACETAMINOPHEN 5-325 MG PO TABS
1.0000 | ORAL_TABLET | Freq: Four times a day (QID) | ORAL | 0 refills | Status: DC | PRN
Start: 2016-04-09 — End: 2016-08-08

## 2016-04-09 NOTE — Progress Notes (Signed)
POD 1   Discharge AF VSS Vision doing better. C/o overall musclesoreness FN function normal JP output 32 cc yesterday. Minimal today.  Removed Wound doing well with minimal swelling Discharge home on hydrocodone and tessalon pearls Follow up next Mon.

## 2016-04-09 NOTE — Telephone Encounter (Signed)
I called the patient today at 5:15I advised her to continue hydrocodone that was prescribed by her surgeonI wanted to get new details on what exactly is going on and how we can help her. I've asked her to call us back tomorrow so I can discuss this in detail. Please call me out of her room when she calls or have her be available when I called her back when she calls [place].She was on Orencia one time and she had to skip her infusion for the surgery and so she may be flaring but we need to find out if it is okay to give her prednisone taper and she can call her surgeon and find out if they're acceptable with that if right talk to her.

## 2016-04-09 NOTE — Telephone Encounter (Signed)
Patient states she had a Parotidectomy 04/08/16. Patient states she woke this morning she was so stiff that she could not move. Patient states all the muscles in her body are hurting. Patient is on Orencia and she had to skip her infusion this month due to her surgery. Patient states she has contacted the surgeon who suggested she call here. Patient also wanted [place] to know she was given Hydrocodone for post op pain relief as she is under a narcotic agreement with [place]. She would like to know what she can do for relief from the stiffness and not being able to move.

## 2016-04-09 NOTE — Discharge Instructions (Addendum)
Can remove dressing and get incision site wet tomorrow Take you regular meds Can take hydrocodone 5 mg tabs as needed for pain  And tessalon pearls for cough Return to see Dr Lucia Gaskins next Monday at 4:15      Call office if any problems or questions    [phone]    Post Anesthesia Home Care Instructions  Activity: Get plenty of rest for the remainder of the day. A responsible adult should stay with you for 24 hours following the procedure.  For the next 24 hours, DO NOT: -Drive a car -Paediatric nurse -Drink alcoholic beverages -Take any medication unless instructed by your physician -Make any legal decisions or sign important papers.  Meals: Start with liquid foods such as gelatin or soup. Progress to regular foods as tolerated. Avoid greasy, spicy, heavy foods. If nausea and/or vomiting occur, drink only clear liquids until the nausea and/or vomiting subsides. Call your physician if vomiting continues.  Special Instructions/Symptoms: Your throat may feel dry or sore from the anesthesia or the breathing tube placed in your throat during surgery. If this causes discomfort, gargle with warm salt water. The discomfort should disappear within 24 hours.  If you had a scopolamine patch placed behind your ear for the management of post- operative nausea and/or vomiting:  1. The medication in the patch is effective for 72 hours, after which it should be removed.  Wrap patch in a tissue and discard in the trash. Wash hands thoroughly with soap and water. 2. You may remove the patch earlier than 72 hours if you experience unpleasant side effects which may include dry mouth, dizziness or visual disturbances. 3. Avoid touching the patch. Wash your hands with soap and water after contact with the patch.

## 2016-04-09 NOTE — Op Note (Signed)
NAME:  Michelle Roberts, Michelle Roberts                      ACCOUNT NO.:  MEDICAL RECORD NO.:  [id number]  LOCATION:                                 FACILITY:  PHYSICIAN:  Leonides Sake. 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  POSTOPERATIVE DIAGNOSIS:  Left parotid mass consistent with a left parotid neoplasm 2.5 cm size.  OPERATION PERFORMED:  Left superficial parotidectomy with facial nerve dissection.  SURGEON:  Leonides Sake. Lucia Gaskins, M.D.  ANESTHESIA:  General endotracheal.  COMPLICATIONS:  None.  ESTIMATED BLOOD LOSS:  50 mL.  BRIEF CLINICAL NOTE:  Michelle Roberts is a 60 year old female who has noticed a nodular mass in the left cheek area for 4-5 years.  It has gradually gotten a little larger and on exam in the office, has approximately 2-1/2 cm firm anterior mid left parotid lesion.  She has normal facial nerve function.  Of note, she had a previous history of thyroid cancer 10 years ago.  She has no adenopathy in the neck and she is taken to operating room at this time for left superficial parotidectomy with facial nerve dissection and resection of left parotid tumor.  DESCRIPTION OF PROCEDURE:  After adequate endotracheal anesthesia, patient received 2 g of Ancef preoperatively, her neck was extended and turned to the right to expose the left parotid gland.  The area was prepped with Betadine solution and draped out with sterile towels.  The proposed incision site was marked out and injected with 6 mL of Marcaine with a 0.25% Marcaine with 1:200,000 epinephrine.  Standard parotid incision was made and dissection was carried down over the parotid fascia anteriorly until we reached the anterior extent of the parotid tumor, is in the central anterior portion of the parotid gland,  extended basically to the anterior portion of the parotid gland.  Next dissection was carried down posteriorly where the tail of parotid was dissected off the sternocleidomastoid muscle and dissection was carried down along the cartilage of the ear.  The nerve was identified just lateral to the styloid process and was in normal anatomical position.  The nerve was then dissected out with bipolar cautery and a sickle knife, and the nerve dissector.  The tumor resided between the upper and lower branches of the facial nerve, kind of is in the midportion of the gland.  First the inferior aspect of the facial nerve was dissected out including the main marginal mandibular branch and the nerve segments were dissected out anterior to the tumor and preserved.  Next the upper branches were dissected out that resided on the superior aspect of the tumor.  After dissecting out and preserving the upper and lower branches of the facial nerve, the mid deep portion of the gland was dissected out including the tumor.  This was dissected out all the way toward the distal end of the parotid duct which was clamped and ligated with 4-0 silk suture.  The tumor was removed in addition to one small  node in the posterior inferior aspect of the parotid bed.  After obtaining adequate hemostasis with bipolar cautery, wound was irrigated and closed with 3-0 chromic sutures subcutaneously and 5-0 nylon to reapproximate skin edges.  A 10- French Jackson-Pratt drain was brought through a separate stab incision behind the ear.  The patient was subsequently awoken from anesthesia and transferred to recovery room, postop doing well.  DISPOSITION:  Michelle Roberts will be observed overnight in [place] and discharged home in the morning after removing the JP drain. Specimen was sent to Pathology.          ______________________________ Leonides Sake Lucia Gaskins, M.D.     CEN/MEDQ  D:  04/08/2016  T:  04/09/2016  Job:   [id number]  cc:   Leonides Sake. Lucia Gaskins, M.D.

## 2016-04-10 NOTE — Telephone Encounter (Signed)
Spoke with the patient a few minutes ago. Patient states that she is doing really well now than she was earlier.Specifically she is having more mobility and able to move her lower legs and upper arms that she did just earlier this morning.She is also convinced now that the anesthesia slowly wearing off even further and allowing her to feel much better. As a result she is not asking for gabapentin or Robaxin and I agreed that we should not be giving it since she is improving.We also discussed the importance of restarting the Orencia at the earliest possible time. She can technically restart the medication in 2 weeks from the surgery date if her surgeon says that her wound is healing well. She will discuss this with the surgeon and call us back and let us know what they decided.

## 2016-04-16 ENCOUNTER — Telehealth: Payer: Self-pay | Admitting: *Deleted

## 2016-04-16 NOTE — Telephone Encounter (Signed)
Oncology Nurse Navigator Documentation  In follow-up to patient presentation/discussion at this morning's H&N Conference, called Dr. Pollie Friar office, spoke with [facility].  I asked her to relay to Dr. Lucia Gaskins consensus for close observation recommended at this time.  She voiced understanding.  Gayleen Orem, RN, BSN, [place] [facility] Nurse [facility] [place] [phone]

## 2016-05-02 ENCOUNTER — Ambulatory Visit (HOSPITAL_BASED_OUTPATIENT_CLINIC_OR_DEPARTMENT_OTHER): Payer: Commercial Managed Care - HMO | Admitting: Internal Medicine

## 2016-05-05 DIAGNOSIS — G4733 Obstructive sleep apnea (adult) (pediatric): Secondary | ICD-10-CM | POA: Diagnosis not present

## 2016-05-22 ENCOUNTER — Telehealth (HOSPITAL_BASED_OUTPATIENT_CLINIC_OR_DEPARTMENT_OTHER): Payer: Self-pay | Admitting: Internal Medicine

## 2016-05-22 NOTE — Progress Notes (Signed)
Appointment scheduled for: Foot Pain     Specialty Location:   [address] [place], [place]                                                   Specialist's name: Valeda Malm   Specialist's NPI#:  [id number]                          Specialty Phone [place]  Specialty Fax Number: [phone]    Reason for appointment: foot pain     Day of appt: Monday                                    Date of appt: 05-26-2016

## 2016-05-27 ENCOUNTER — Other Ambulatory Visit (HOSPITAL_BASED_OUTPATIENT_CLINIC_OR_DEPARTMENT_OTHER): Payer: Self-pay | Admitting: Internal Medicine

## 2016-05-27 DIAGNOSIS — M79671 Pain in right foot: Secondary | ICD-10-CM

## 2016-05-27 DIAGNOSIS — M79672 Pain in left foot: Principal | ICD-10-CM

## 2016-05-29 ENCOUNTER — Ambulatory Visit (HOSPITAL_BASED_OUTPATIENT_CLINIC_OR_DEPARTMENT_OTHER): Payer: Commercial Managed Care - HMO | Admitting: Medical

## 2016-05-29 ENCOUNTER — Telehealth (HOSPITAL_BASED_OUTPATIENT_CLINIC_OR_DEPARTMENT_OTHER): Payer: Self-pay | Admitting: Medical

## 2016-05-29 VITALS — BP 113/86 | HR 79 | Temp 96.3°F | Ht 62.99 in | Wt 211.0 lb

## 2016-05-29 DIAGNOSIS — F1120 Opioid dependence, uncomplicated: Secondary | ICD-10-CM

## 2016-05-29 DIAGNOSIS — M79672 Pain in left foot: Secondary | ICD-10-CM

## 2016-05-29 DIAGNOSIS — F17200 Nicotine dependence, unspecified, uncomplicated: Secondary | ICD-10-CM

## 2016-05-29 DIAGNOSIS — I83812 Varicose veins of left lower extremities with pain: Secondary | ICD-10-CM

## 2016-05-29 DIAGNOSIS — F332 Major depressive disorder, recurrent severe without psychotic features: Secondary | ICD-10-CM

## 2016-05-29 DIAGNOSIS — Z23 Encounter for immunization: Secondary | ICD-10-CM

## 2016-05-29 DIAGNOSIS — M2061 Acquired deformities of toe(s), unspecified, right foot: Secondary | ICD-10-CM

## 2016-05-29 DIAGNOSIS — J449 Chronic obstructive pulmonary disease, unspecified: Secondary | ICD-10-CM

## 2016-05-29 DIAGNOSIS — Z1239 Encounter for other screening for malignant neoplasm of breast: Secondary | ICD-10-CM

## 2016-05-29 DIAGNOSIS — Z1211 Encounter for screening for malignant neoplasm of colon: Secondary | ICD-10-CM

## 2016-05-29 MED ORDER — NAPROXEN 500 MG PO TBEC
500.0000 mg | DELAYED_RELEASE_TABLET | Freq: Two times a day (BID) | ORAL | 0 refills | Status: DC
Start: 2016-05-29 — End: 2016-08-22

## 2016-05-29 MED ORDER — BUPROPION HCL 100 MG PO TABS: 100 mg | tablet | Freq: Two times a day (BID) | ORAL | 0 refills | 0 days | Status: DC

## 2016-05-29 MED ORDER — BUPROPION HCL 100 MG PO TABS
100.0000 mg | ORAL_TABLET | Freq: Two times a day (BID) | ORAL | 0 refills | Status: DC
Start: 2016-05-29 — End: 2016-11-27

## 2016-05-29 MED ORDER — NAPROXEN 500 MG PO TBEC: 500 mg | tablet | Freq: Two times a day (BID) | ORAL | 0 refills | 0 days | Status: DC

## 2016-05-29 MED ORDER — NICOTINE 14 MG/24HR TD PT24
1.0000 | MEDICATED_PATCH | TRANSDERMAL | 0 refills | Status: DC
Start: 2016-05-29 — End: 2016-11-29

## 2016-05-29 MED ORDER — NICOTINE 14 MG/24HR TD PT24: 1 | patch | TRANSDERMAL | 0 refills | 0 days | Status: DC

## 2016-05-29 NOTE — Progress Notes (Signed)
genevieve from [facility] called the Central [facility] Department to complete a benefit analysis for the tdap Vaccine.    The vaccine is covered under the patient’s network medical coverage.    Please choose [id number] (Private)    If you do not have the vaccine in stock, please contact the [facility] Drug Distribution department ([phone]) to have the vaccine delivered to your clinic
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
## 2016-05-29 NOTE — Progress Notes (Signed)
Influenza Vaccine Procedure      1. Has the patient received the information for the influenza vaccine? Yes    2. Does the patient have any of the following contraindications?  Allergy to eggs? No  Allergic reaction to previous influenza vaccines? No  Any other problems to previous influenza vaccines? No  Paralyzed by Guillain-Barre syndrome?  No  Current moderate or severe illness? No  Allergy to contact lens solution? No    3. The vaccine has been administered in the usual fashion.     Immunization information reviewed. Current VIS reviewed and given to patient/ guardian. Verbal assent obtained from patient/ guardian.  See immunization/Injection module or chart review for date of publication and additional information. Verbal assent obtained from patient/guardian. Comfort measures for possible side effects reviewed.

## 2016-06-12 ENCOUNTER — Ambulatory Visit (HOSPITAL_BASED_OUTPATIENT_CLINIC_OR_DEPARTMENT_OTHER): Payer: Commercial Managed Care - HMO

## 2016-06-12 DIAGNOSIS — Z1211 Encounter for screening for malignant neoplasm of colon: Secondary | ICD-10-CM

## 2016-06-12 LAB — POC IMMUNOASSAY FECAL OCCULT BLOOD TEST: POC FECAL OCCULT BLOOD TEST (IMMUNOASSAY): POSITIVE — AB

## 2016-06-12 NOTE — Progress Notes (Signed)
ifob received and resulted.  Cheryl Dixon, [place], 06/12/2016, 1:54 PM

## 2016-06-13 ENCOUNTER — Telehealth (HOSPITAL_BASED_OUTPATIENT_CLINIC_OR_DEPARTMENT_OTHER): Payer: Self-pay | Admitting: Internal Medicine

## 2016-06-13 DIAGNOSIS — Z1211 Encounter for screening for malignant neoplasm of colon: Secondary | ICD-10-CM

## 2016-06-13 NOTE — Progress Notes (Signed)
Spoke to pt.  Ifob is positive and will need a colonoscopy.  Pt understands and will have it scheduled.  [place], [place], 06/13/2016, 5:08 PM

## 2016-06-13 NOTE — Progress Notes (Signed)
Dear RN,    Please:    1. Create Telephone encounter for this patient.  2. Share with the patient the following abnormal results: Positive IFOB    Plan:  1. A referral to Gastroenterology will be made for colonoscopy .    2. Type of Outreach: 3 phone calls and if unable to reach send letter    3. Document the conversation in the Telephone Encounter and send the encounter back to me to complete orders and any further necessary documentation.     Thank you,  Osvaldo Shipper, PA-C

## 2016-06-15 NOTE — Progress Notes (Signed)
Results discussed with patient, see telephone encounter, referred for colonoscopy

## 2016-06-15 NOTE — Addendum Note (Signed)
Addended by: Kitrina Maurin on: 06/15/2016 10:14 PM     Modules accepted: Orders

## 2016-06-16 ENCOUNTER — Telehealth (HOSPITAL_BASED_OUTPATIENT_CLINIC_OR_DEPARTMENT_OTHER): Payer: Self-pay | Admitting: Registered Nurse

## 2016-06-16 NOTE — Progress Notes (Signed)
Notes Recorded by Osvaldo Shipper, PA on 06/13/2016 at 10:17 AM EST  Dear RN,    Please:   1. Create Telephone encounter for this patient.  2. Share with the patient the following abnormal results: Positive IFOB    Plan:  1. A referral to Gastroenterology will be made for colonoscopy .    2. Type of Outreach: 3 phone calls and if unable to reach send letter    3. Document the conversation in the Telephone Encounter and send the encounter back to me to complete orders and any further necessary documentation.     Thank you,  Osvaldo Shipper, PA-C

## 2016-06-16 NOTE — Progress Notes (Signed)
Pt advised.

## 2016-06-17 ENCOUNTER — Other Ambulatory Visit (HOSPITAL_BASED_OUTPATIENT_CLINIC_OR_DEPARTMENT_OTHER): Payer: Self-pay

## 2016-06-17 ENCOUNTER — Encounter (HOSPITAL_BASED_OUTPATIENT_CLINIC_OR_DEPARTMENT_OTHER): Payer: Self-pay

## 2016-06-17 DIAGNOSIS — Z1211 Encounter for screening for malignant neoplasm of colon: Secondary | ICD-10-CM

## 2016-06-17 NOTE — Patient Instructions (Signed)
Colonoscopy Standard       Division of Gastroenterology  [facility]  Phone:__________________  Address: [address], [place],  [address]    PLEASE READ CAREFULLY OR HAVE SOMEONE READ THIS TO YOU!      ____________________________________________  Name    Your Colonoscopy test is scheduled at _____________ Hospital    Day_______________     Time to Arrive _______________    Register ______________________            You are having a colonoscopy.  This test lets the doctor see the inside your large intestine (also called your colon) using a small camera on a flexible tube.  There are several reasons to have the test:   *Screening for colon cancers and polyps    *To check unexplained changes in bowel habits   *To evaluate abnormal X-ray findings   *To look for bleeding ulcers or other abnormalities of the colon lining                 HOW TO GET READY FOR THE TEST      Your prescription was sent to your pharmacy or given to you.    Please pick up your prescription within 1 week of receiving these instructions.     IF YOU ARE USING NULYTELY OR A DRUG LIKE IT, DO NOT ADD WATER TO THE PRESCRIPTION UNTIL 1 DAY BEFORE THE DAY OF YOUR EXAM.    Your prescription was sent to: _______________________________________    Call a friend or a family member to make sure you have   someone to take you home after your test.   You must have someone to go home with after the test or we will not be able to give you sedatives!      If you have any questions or worries, or if you are unsure about how to prepare for this test, please call _____________                                  DAY OF THE TEST    4 to 5 hours before your test, drink the remaining half of the bottle of laxative.    It is very important to finish the WHOLE GALLON.    The morning of the test, you can take your other medications at the usual time with a sip of water. These include blood pressure pills, seizure medications, heart medications,  thyroid medications, etc).     Don’t take pills for diabetes. Bring these medicines with you so that you can take them right after your test.      NOTHING to drink for 3 hours before the test.      IMPORTANT INFORMATION About Your Medicines  Based On Recommendations From Experts    IF YOU HAVE ANY CONCERNS ABOUT YOUR MEDICATIONS ASK YOUR OWN DOCTOR. DO NOT WAIT UNTIL THE DAY BEFORE THE TEST!!!!!      Your Medications  You can take your medications for high blood pressure, heart problems, or anxiety with a sip of water at your usual time.  Bring a list of your medications with you.     If you take medicines for Type 2 Diabetes:    One day before the test: If you are taking diabetes pills: Take PILLS in the morning only. If you take morning insulin: take your usual dose.    On the night before the   test: Do not take diabetes pills.  If you take insulin for type 2 diabetes, take ½ your usual long acting insulin.  For example: if you usually take 40 units of Lantus or NPH, take 20 units instead.    On the morning of the test: Do not take diabetes pills. If you are on long-acting insulin for type 2 diabetes, you can take half the dose. For example if you are on 40 units of Lantus or NPH, take 20 units instead.     After the test: You will be allowed to eat normally again. At that time, you should resume taking your diabetes pills at your usual times. If on insulin, take your usual evening dose of insulin. If you can’t eat for any reason, check with your doctor.    If you have Type 1 Diabetes:    One day before the test: Take your usual long-acting basal insulin (Lantus or NPH) and make sure your clear liquid diets contain sugar. Check your blood sugars at meal times and cover with short acting insulin only if blood sugar is over 200. Otherwise do not take your short-acting insulin.    On the night before the test: Just take your usual basal insulin dose that you would normally take at that time (for example, your usual  full dose of Lantus or NPH).    On the morning of the test: Take your usual basal insulin dose that you would normally take at that time (for example, your usual full dose of Lantus or NPH).                            Test Checklist    Please use this list to prepare for your test.     7 days before the test (____/____/____)     STOP taking Motrin, Advil, Naprosyn, Aleve or related drugs.   Continue to take all other prescribed meds.   Ask your doctor about whether you should continue Aspirin. It is sometimes        important to stay on this medication.    3 days before the test (____/____/____)     STOP eating any foods that contain beans, seeds, skins, nuts, or are high in fiber.    2 days before the test (____/____/____)     MAKE SURE YOU HAVE A RIDE ARRANGED    EAT dinner no later than 7 pm.  BEGIN a liquid diet. (Do not eat solids. This includes foods such as rice, pasta, bread and fruit).    1 day before the test (____/____/____)     PREPARE the laxative.   START taking the laxative at 6:00pm.   DRINK ½ the bottle of the laxative.    Day of your test (____/____/____)     FINISH  the rest of the laxative 5 to 6 hours before your test.     DO NOT drink anything 3 hours before the test.      WHAT TO EXPECT ON THE DAY OF YOUR TEST    Colonoscopy  Colonoscopy is a procedure used to see inside the colon and rectum.  During a colonoscopy a flexible tube with a camera and a light is inserted through the rectum. The doctor then examines the large bowel (also called the large intestine or colon).    Colonoscopies can detect inflamed tissue, ulcers and abnormal growths called polyps. Some polyps are cancerous, but most are “pre cancerous”.   These might become dangerous someday. A doctor can usually remove these polyps during the test. They are then sent to a laboratory to be checked. Polyps are common in adults and are generally harmless. However, most colorectal cancers begin as polyps. Removing them is a good way to  prevent cancer. Your doctor may also take biopsies (small pieces of tissue) for analysis in the lab of abnormalities that they want to check in more detail.    It is extremely important to follow all of the steps for cleaning out your colon. If you do not follow all the steps, the doctor may not be able to see clearly. The exam might need to be cancelled and repeated another day. The clear liquids you can take during the clean out are treated as food by your body, so you won’t starve or get dehydrated.        Getting Ready At The Hospital  On the day of your test, a nurse and doctor will ask you some more information about your health history.  You will be put into a hospital gown. A small intravenous needle will be inserted in the back of your hand or forearm to give you medications that will make you comfortable during the test.    In the procedure room, you will be asked to lie down in a curled position on your left side.  Please inform the doctor if this is uncomfortable for you. You will be given oxygen to breathe. The test usually lasts 30 minutes to an hour. It may last longer if polyps need to be removed or if other abnormalities are noted.      You will be given medication through the IV in order to control discomfort and help you relax.  You may sleep or be partially awake during the test. Sometimes, you might feel a cramping sensation. We will monitor you and try to make you as comfortable as possible. The tube will be inserted into your rectum (back side) and advanced through the large bowel.  The doctor will try and look at all of the inner walls of your colon. The outer wall and organs outside the colon are not visible with this test.    Possible Complications  Complications are unusual during or after the test but they can happen. The most common risks include colonic perforation (a tear in the colon), bleeding, respiratory problems, blood pressure problems, heart problems, discomfort and adverse  reactions to the medications used.  A perforation may result in the need for emergency surgery and a colostomy bag. Also note, colonoscopy like other medical tests is not perfect. It may not detect problems such as polyps, cancers and other diseases up to 2 to 6% of the time. Luckily, the combined risk of all of these problems is small.    After the Test  You may feel bloated from air which was put into your colon during the test. You may also feel a little drowsy from the medications. You cannot drive or operate heavy machinery or do any important work for the rest of the day. You should plan on resting, watching TV or reading light material after the test. You may forget things that happen during and directly after the test. It is important have someone with you that can remind you of any instructions we give.    Depending on what is found, you may need to have the colonoscopy repeated. Usually, this is done several years later but it may   need to be done much sooner. Talk to your doctor about when you should have repeat study or other testing.    You will usually be at the hospital between 2-3 hours (although sometimes it can take longer).  We will make sure that you are alright before sending you home. You must arrange for someone to drive you home after the test.  Once again, we will not perform the test unless you have an arranged ride. You cannot go home in a taxi or a bus.    Colonoscopy is a safe and effective test that is commonly done at our facilities. You may receive a call to remind you of the date and time of your test. If you have any questions, please feel free to call.     For questions about the test itself, call [phone].    For questions about the date and time of your test, or to change the date or time, call [phone]    For questions regarding your regular medications or health issues, please call your doctor.    Verbal instructions given over phone. Written instructions sent to patient  in mail. Script sent to pharmacy.

## 2016-06-17 NOTE — Telephone Encounter (Signed)
Spoke with patient regarding scheduling colonoscopy. Procedure/preparation/need for ride explained. Assessment completed. Patient scheduled for a colonoscopy +FOBT with Dr Mamie Levers and anesthesia support at [facility] on 07/31/16

## 2016-06-18 ENCOUNTER — Ambulatory Visit (HOSPITAL_BASED_OUTPATIENT_CLINIC_OR_DEPARTMENT_OTHER): Payer: Commercial Managed Care - HMO | Admitting: Foot & Ankle Surgery

## 2016-06-24 MED ORDER — PEG 3350-KCL-NA BICARB-NACL 420 G PO SOLR: Bottle | ORAL | 0 refills | 0 days | Status: AC

## 2016-06-24 MED ORDER — PEG 3350-KCL-NA BICARB-NACL 420 G PO SOLR
ORAL | 0 refills | Status: AC
Start: 2016-06-24 — End: 2016-08-16

## 2016-06-25 ENCOUNTER — Ambulatory Visit (HOSPITAL_BASED_OUTPATIENT_CLINIC_OR_DEPARTMENT_OTHER): Payer: Commercial Managed Care - HMO | Admitting: Vascular Surgery

## 2016-06-26 ENCOUNTER — Ambulatory Visit (HOSPITAL_BASED_OUTPATIENT_CLINIC_OR_DEPARTMENT_OTHER): Payer: Commercial Managed Care - HMO | Admitting: Physician Assistant

## 2016-07-03 DIAGNOSIS — E039 Hypothyroidism, unspecified: Secondary | ICD-10-CM | POA: Diagnosis not present

## 2016-07-03 DIAGNOSIS — G4700 Insomnia, unspecified: Secondary | ICD-10-CM | POA: Diagnosis not present

## 2016-07-03 DIAGNOSIS — I10 Essential (primary) hypertension: Secondary | ICD-10-CM | POA: Diagnosis not present

## 2016-07-03 DIAGNOSIS — E782 Mixed hyperlipidemia: Secondary | ICD-10-CM | POA: Diagnosis not present

## 2016-07-03 DIAGNOSIS — E1140 Type 2 diabetes mellitus with diabetic neuropathy, unspecified: Secondary | ICD-10-CM | POA: Diagnosis not present

## 2016-07-08 ENCOUNTER — Ambulatory Visit: Payer: Self-pay | Admitting: Medical

## 2016-07-10 ENCOUNTER — Ambulatory Visit (HOSPITAL_BASED_OUTPATIENT_CLINIC_OR_DEPARTMENT_OTHER): Payer: Commercial Managed Care - HMO | Admitting: Physician Assistant

## 2016-07-10 DIAGNOSIS — E119 Type 2 diabetes mellitus without complications: Secondary | ICD-10-CM | POA: Diagnosis not present

## 2016-07-18 ENCOUNTER — Telehealth (HOSPITAL_BASED_OUTPATIENT_CLINIC_OR_DEPARTMENT_OTHER): Payer: Self-pay | Admitting: Surgery

## 2016-07-18 NOTE — Progress Notes (Signed)
Cheryl Dixon, 07/18/2016, 11:26 AM    Called and spoke with patient to confirm colonoscopy with Dr. Mamie Levers on  Thursday 07/31/2016 with an arrival time at 07:30 AM.    Offered to reviewed the prep instructions, patient verbalized understanding and stated that she has the written instruction for the Colonoscopy prep.    Patient understands the need for ride after the procedure and stated that she will pick up the medication from the pharmacy for Colonoscopy.     Patient was given the office number to call for any questions.

## 2016-07-22 ENCOUNTER — Other Ambulatory Visit: Payer: Self-pay | Admitting: Rheumatology

## 2016-07-22 NOTE — Telephone Encounter (Signed)
Last Visit: 02/07/16 Next Visit was due February 2018. Message sent to the front to schedule patient.  Okay to refill Gabapentin and Ibuprofen?

## 2016-07-24 ENCOUNTER — Telehealth: Payer: Self-pay | Admitting: Rheumatology

## 2016-07-24 NOTE — Telephone Encounter (Signed)
-----   Message from Carole Binning, LPN sent at 7/61/5183  9:20 AM EDT ----- Regarding: Please schedule patient for follow up visit Please schedule patient for follow up visit. Patient was due February 2018. Thanks!

## 2016-07-24 NOTE — Telephone Encounter (Signed)
LVMOM for patient to call the office back to schedule her fu appointment.

## 2016-07-25 ENCOUNTER — Telehealth (HOSPITAL_BASED_OUTPATIENT_CLINIC_OR_DEPARTMENT_OTHER): Payer: Self-pay | Admitting: Surgery

## 2016-07-25 NOTE — Progress Notes (Addendum)
Cheryl Dixon, 07/25/2016, 11:59 AM    Called and left voicemail for patient to call the office back to confirm her Colonoscopy scheduled on Thursday 07/31/2016  with Dr. Mamie Levers with an arrival time at 07:30 AM.    Instructed to call back to confirm and to have any questions answered.    Provided patient the office number where she can reach [place].    ADDENDUM Cheryl Dixon, 07/25/2016, 3:10 PM    Called and spoke with patient to confirm colonoscopy with Dr. Mamie Levers on  Thursday 07/31/2016 with an arrival time at 07:30 AM.    Offered to review the prep instructions, patient verbalized understanding of the Colonoscopy instructions.     Patient understands the need for ride after the procedure.     Patient was given the office number to call for any questions.

## 2016-07-26 ENCOUNTER — Other Ambulatory Visit: Payer: Self-pay | Admitting: Rheumatology

## 2016-07-29 NOTE — Telephone Encounter (Signed)
Only 30 day supply will be refilled. Patient needs office visit appointment

## 2016-07-29 NOTE — Telephone Encounter (Signed)
Last Visit: 02/07/16 Next Visit was due February 2018. Message sent to the front to schedule patient.  Okay to refill Amitriptyline?

## 2016-07-30 ENCOUNTER — Telehealth (HOSPITAL_BASED_OUTPATIENT_CLINIC_OR_DEPARTMENT_OTHER): Payer: Self-pay

## 2016-07-30 NOTE — Progress Notes (Signed)
Patient called to reschedule colonoscopy for tomorrow with Dr. Mamie Levers with anesthesia assist. Could not tolerate nulytely, was vomiting. Rescheduled for April 27 @ 7:30am with Dr. Deneise Lever. Miralax/ducolax prep sent to patient.

## 2016-07-30 NOTE — Patient Instructions (Signed)
Miralax and Dulcolax Bowel Prep     Division of Gastroenterology  [facility]  Phone:    Address: [address], , [place] [address]    PLEASE READ CAREFULLY OR HAVE SOMEONE READ THIS TO YOU!    ____________________________________________  Name           You are having a colonoscopy.  This test lets the doctor see the inside your large intestine (also called your colon) using a small camera on a flexible tube.  There are several reasons to have the test:  • Screening for colon cancers and polyps   • To check unexplained changes in bowel habits  • To evaluate abnormal X-ray findings  • To look for bleeding ulcers or other abnormalities of the colon lining                HOW TO GET READY FOR THE TEST    You will need to purchase the following items from a pharmacy      • ONE BOTTLE OF MIRALAX (8.3 OUNCE OR 238 GRAMS)  • 4 DULCOLAX OR BISACODYL TABLETS ( 5 MILLIGRAM TABLETS)  • A 64 OUNCE BOTTLE OF SPORTS DRINK SUCH AS GATORADE-IF YOU HAVE DIABETES, BUY A NO OR LOW CAROLIE DRINK SUCH AS CRYSTAL LIGHT INSTEAD. (YOU WILL USE THIS TO MIX YOUR MIRALAX THE DAY BEFORE THE TEST. NO CARBONATED BEVERAGES)      Call a friend or a family member to make sure you have   someone to take you home after your test.   You must have someone to go home with after the test or we will not be able to give you sedatives!    If you have any questions or worries, or if you are unsure about how to prepare for this test, please call                           Day of the test      The morning of the test, you can take your other medications at the usual time with a sip of water. These include blood pressure pills, seizure medications, heart medications, thyroid medications, etc).     Don’t take pills for diabetes. Bring these medicines with you so that you can take them right after your test.      NOTHING TO DRINK FOR 3 HOURS BEFORE YOU COME TO HOSPITAL                                IMPORTANT INFORMATION About Your  Medicines  Based On Recommendations From Experts    IF YOU HAVE ANY CONCERNS ABOUT YOUR MEDICATIONS ASK YOUR OWN DOCTOR. DO NOT WAIT UNTIL THE DAY BEFORE THE TEST!!!!!    Your Medications  • You can take your medications for high blood pressure, heart problems, or anxiety with a sip of water at your usual time.  • Bring a list of your medications with you.           If you take medicines for Type 2 Diabetes:    ? One day before the test: If you are taking diabetes pills: Take PILLS in the morning only. If you take morning insulin: take your usual dose.    ? On the night before the test: Do not take diabetes pills.  If you take insulin for type 2 diabetes, take ½   your usual long acting insulin.  For example: if you usually take 40 units of Lantus or NPH, take 20 units instead.    ? On the morning of the test: Do not take diabetes pills. If you are on long-acting insulin for type 2 diabetes, you can take half the dose. For example if you are on 40 units of Lantus or NPH, take 20 units instead.     ? After the test: You will be allowed to eat normally again. At that time, you should resume taking your diabetes pills at your usual times. If on insulin, take your usual evening dose of insulin. If you can’t eat for any reason, check with your doctor.    If you have Type 1 Diabetes:    ? One day before the test: Take your usual long-acting basal insulin (Lantus or NPH) and make sure your clear liquid diets contain sugar. Check your blood sugars at meal times and cover with short acting insulin only if blood sugar is over 200. Otherwise do not take your short-acting insulin.    ? On the night before the test: Just take your usual basal insulin dose that you would normally take at that time (for example, your usual full dose of Lantus or NPH).    ? On the morning of the test: Take your usual basal insulin dose that you would normally take at that time (for example, your usual full dose of Lantus or NPH).    Also  note…    ? Tylenol (Acetaminophen) is ok for moderate pain relief.     ? If you are on blood thinners (such as Aspirin, Warfarin, Coumadin, Heparin or Plavix) you must ask your doctor or nurse before stopping these medications!!! This important if you take your medicines for heart disease, stroke disease, a pulmonary embolus, artificial heart valves, a heart stent or a vascular stent and any other problems that could lead to a serious clot forming, stroke or heart attack.          Test Checklist  Please use this list to prepare for your test.   -------------------------------------------------------------------------------------------------------------------  7 days before the test        STOP taking Motrin, Advil, Naprosyn, Aleve or related drugs.   Continue to take all other prescribed meds.   Ask your doctor about whether you should continue Aspirin. It is sometimes important to stay on this medication.  -------------------------------------------------------------------------------------------------------------------  3 days before the test        STOP eating any foods that contain beans, seeds, skins, nuts, or are high in fiber.  -------------------------------------------------------------------------------------------------------------------  2 days before the test        MAKE SURE YOU HAVE A RIDE ARRANGED    EAT dinner no later than 7 pm.  BEGIN a liquid diet. (Do not eat solids. This includes foods such as rice ,pasta, bread and fruit).  -------------------------------------------------------------------------------------------------------------------  1 day before the test        PREPARE the laxative.   START taking the Dulcolax tablets at 1 PM.   DRINK the Miralax solution at 4PM  -------------------------------------------------------------------------------------------------------------------  Day of your test                            DO NOT drink anything 3 hours before the test.    WHAT TO EXPECT ON  THE DAY OF YOUR TEST    Colonoscopy    Colonoscopy is a procedure   used to see inside the colon and rectum.  During a colonoscopy a flexible tube with a camera and a light is inserted through the rectum. The doctor then examines the large bowel (also called the large intestine or colon).    Colonoscopies can detect inflamed tissue, ulcers and abnormal growths called polyps. Some polyps are cancerous, but most are “pre cancerous”. These might become dangerous someday. A doctor can usually remove these polyps during the test. They are then sent to a laboratory to be checked. Polyps are common in adults and are generally harmless. However, most colorectal cancers begin as polyps. Removing them is a good way to prevent cancer. Your doctor may also take biopsies (small pieces of tissue) for analysis in the lab of abnormalities that they want to check in more detail.    It is extremely important to follow all of the steps for cleaning out your colon. If you do not follow all the steps, the doctor may not be able to see clearly. The exam might need to be cancelled and repeated another day. The clear liquids you can take during the clean out are treated as food by your body, so you won’t starve or get dehydrated.        Getting Ready At The Hospital    On the day of your test, a nurse and doctor will ask you some more information about your health history.  You will be put into a hospital gown. A small intravenous needle will be inserted in the back of your hand or forearm to give you medications that will make you comfortable during the test.    In the procedure room, you will be asked to lie down in a curled position on your left side.  Please inform the doctor if this is uncomfortable for you. You will be given oxygen to breathe. The test usually lasts 30 minutes to an hour. It may last longer if polyps need to be removed or if other abnormalities are noted.      You will be given medication through the IV in order to  control discomfort and help you relax.  You may sleep or be partially awake during the test. Sometimes, you might feel a cramping sensation. We will monitor you and try to make you as comfortable as possible. The tube will be inserted into your rectum (back side) and advanced through the large bowel.  The doctor will try and look at all of the inner walls of your colon. The outer wall and organs outside the colon are not visible with this test.    Possible Complications    Complications are unusual during or after the test but they can happen. The most common risks include colonic perforation (a tear in the colon), bleeding, respiratory problems, blood pressure problems, heart problems, discomfort and adverse reactions to the medications used.  A perforation may result in the need for emergency surgery and a colostomy bag. Also note, colonoscopy like other medical tests is not perfect. It may not detect problems such as polyps, cancers and other diseases up to 2 to 6% of the time. Luckily, the combined risk of all of these problems is small.    After the Test    You may feel bloated from air which was put into your colon during the test. You may also feel a little drowsy from the medications. You cannot drive or operate heavy machinery or do any important work for the rest of the day.   You should plan on resting, watching TV or reading light material after the test. You may forget things that happen during and directly after the test. It is important have someone with you that can remind you of any instructions we give.    Depending on what is found, you may need to have the colonoscopy repeated. Usually, this is done several years later but it may need to be done much sooner. Talk to your doctor about when you should have repeat study or other testing.        You will usually be at the hospital between 2-3 hours (although sometimes it can take longer).  We will make sure that you are alright before sending you home.  You must arrange for someone to drive you home after the test.  Once again, we will not perform the test unless you have an arranged ride. You cannot go home in a taxi or a bus.    Colonoscopy is a safe and effective test that is commonly done at our facilities. You may receive a call to remind you of the date and time of your test. If you have any questions, please feel free to call.     For questions about the test itself, call [phone].    For questions about the date and time of your test, or to change the date or time, call [phone]    For questions regarding your regular medications or health issues, please call your doctor.

## 2016-07-31 ENCOUNTER — Encounter (HOSPITAL_BASED_OUTPATIENT_CLINIC_OR_DEPARTMENT_OTHER): Payer: Self-pay

## 2016-08-08 ENCOUNTER — Telehealth (HOSPITAL_BASED_OUTPATIENT_CLINIC_OR_DEPARTMENT_OTHER): Payer: Self-pay | Admitting: Surgery

## 2016-08-08 ENCOUNTER — Encounter: Payer: Self-pay | Admitting: Podiatry

## 2016-08-08 ENCOUNTER — Ambulatory Visit (INDEPENDENT_AMBULATORY_CARE_PROVIDER_SITE_OTHER): Payer: 59 | Admitting: Podiatry

## 2016-08-08 DIAGNOSIS — L84 Corns and callosities: Secondary | ICD-10-CM | POA: Diagnosis not present

## 2016-08-08 DIAGNOSIS — M779 Enthesopathy, unspecified: Secondary | ICD-10-CM

## 2016-08-08 MED ORDER — TRIAMCINOLONE ACETONIDE 10 MG/ML IJ SUSP
10.0000 mg | Freq: Once | INTRAMUSCULAR | Status: AC
  Administered 2016-08-08: 10 mg

## 2016-08-08 NOTE — Progress Notes (Signed)
Called and spoke with patient to confirm colonoscopy with Dr. Deneise Lever on  Friday 08/22/16 with an arrival time at 7:40 am.    Reviewed the prep instructions, she will be using miralax and dulcolax tablets and patient verbalized understanding. She has a hard copy for reference.    Patient understands the need for ride after the procedure.     Patient was given the office number to call for any questions.

## 2016-08-10 NOTE — Progress Notes (Signed)
Subjective:     Patient ID: Michelle Roberts, female   DOB: [birth year], 61 y.o.   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

## 2016-08-12 DIAGNOSIS — Z8679 Personal history of other diseases of the circulatory system: Secondary | ICD-10-CM | POA: Insufficient documentation

## 2016-08-12 DIAGNOSIS — Z8639 Personal history of other endocrine, nutritional and metabolic disease: Secondary | ICD-10-CM | POA: Insufficient documentation

## 2016-08-12 DIAGNOSIS — M545 Low back pain, unspecified: Secondary | ICD-10-CM | POA: Insufficient documentation

## 2016-08-12 DIAGNOSIS — C73 Malignant neoplasm of thyroid gland: Secondary | ICD-10-CM | POA: Insufficient documentation

## 2016-08-12 DIAGNOSIS — K146 Glossodynia: Secondary | ICD-10-CM | POA: Insufficient documentation

## 2016-08-12 DIAGNOSIS — Z8739 Personal history of other diseases of the musculoskeletal system and connective tissue: Secondary | ICD-10-CM | POA: Insufficient documentation

## 2016-08-12 DIAGNOSIS — M19042 Primary osteoarthritis, left hand: Secondary | ICD-10-CM

## 2016-08-12 DIAGNOSIS — M797 Fibromyalgia: Secondary | ICD-10-CM | POA: Insufficient documentation

## 2016-08-12 DIAGNOSIS — M5136 Other intervertebral disc degeneration, lumbar region: Secondary | ICD-10-CM | POA: Insufficient documentation

## 2016-08-12 DIAGNOSIS — G4739 Other sleep apnea: Secondary | ICD-10-CM | POA: Insufficient documentation

## 2016-08-12 DIAGNOSIS — Z8719 Personal history of other diseases of the digestive system: Secondary | ICD-10-CM | POA: Insufficient documentation

## 2016-08-12 DIAGNOSIS — M19041 Primary osteoarthritis, right hand: Secondary | ICD-10-CM | POA: Insufficient documentation

## 2016-08-12 DIAGNOSIS — E559 Vitamin D deficiency, unspecified: Secondary | ICD-10-CM | POA: Insufficient documentation

## 2016-08-12 DIAGNOSIS — Z79899 Other long term (current) drug therapy: Secondary | ICD-10-CM | POA: Insufficient documentation

## 2016-08-12 NOTE — Progress Notes (Signed)
Office Visit Note  Patient: Michelle Roberts             Date of Birth: 1955/07/25           MRN: [id number]             PCP: Gara Kroner, MD Referring: Antony Contras, MD Visit Date: 08/14/2016 Occupation: @GUAROCC @    Subjective:  Follow-up  History of Present Illness: Michelle Roberts is a 61 y.o. female  Last seen Feb 07, 2016  Hx of abscess on left side of mouth. Turned out to be  LEFT SUPERFICIAL PAROTIDECTOMY WITH FACIAL NERVE DISECTION; Surgeon: Rozetta Nunnery, MD; Location: [facility] Dec 2017  Hx RA. 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 Last Maureen Chatters was November. None since then. NO ORENCIA  in nov, dec, jan, feb, march ,arpril. Also off of MTX for about 1 year due to abnl labs.  Also has had THYROID CANCER in past . Surgery Dec 2006.  Currently ====> Lately, am stiffness about 5-10 minutes.    Activities of Daily Living:  Patient reports morning stiffness for 10 minutes.   Patient Denies nocturnal pain.  Difficulty dressing/grooming: Denies Difficulty climbing stairs: Denies Difficulty getting out of chair: Denies Difficulty using hands for taps, buttons, cutlery, and/or writing: Denies   Review of Systems  Constitutional: Positive for fatigue.  HENT: Negative for mouth sores and mouth dryness.   Eyes: Negative for dryness.  Respiratory: Negative for shortness of breath.   Gastrointestinal: Negative for constipation and diarrhea.  Musculoskeletal: Positive for myalgias and myalgias.  Skin: Negative for sensitivity to sunlight.  Psychiatric/Behavioral: Positive for sleep disturbance. Negative for decreased concentration.    PMFS History:  Patient Active Problem List   Diagnosis Date Noted  . Fibromyalgia syndrome 08/12/2016  . High risk medication use 08/12/2016  . Primary osteoarthritis of both hands 08/12/2016  . Acute midline low back pain 08/12/2016  . DDD (degenerative disc disease), lumbar  08/12/2016  . History of hypertension 08/12/2016  . History of high cholesterol 08/12/2016  . Thyroid ca ([place]) 08/12/2016  . History of hypothyroidism 08/12/2016  . History of gastroesophageal reflux (GERD) 08/12/2016  . History of TMJ syndrome 08/12/2016  . Burning tongue syndrome 08/12/2016  . History of allergy 08/12/2016  . Other sleep apnea 08/12/2016  . Vitamin D deficiency 08/12/2016  . Rheumatoid arthritis with rheumatoid factor of multiple sites without organ or systems involvement ([place]) 12/31/2015    Past Medical History:  Diagnosis Date  . Anxiety   . Arthritis   . Depression   . Fibromyalgia   . GERD (gastroesophageal reflux disease)   . Hyperlipidemia   . Hypertension   . Hypothyroidism    thyroidectomy  . Neoplasm of parotid gland   . Neuromuscular disorder ([place])   . PONV (postoperative nausea and vomiting)   . Sleep apnea     No family history on file. Past Surgical History:  Procedure Laterality Date  . ABDOMINAL HYSTERECTOMY    . CHOLECYSTECTOMY    . HAMMER TOE SURGERY    . PAROTIDECTOMY Left 04/08/2016   Procedure: LEFT SUPERFICIAL PAROTIDECTOMY WITH FACIAL NERVE DISECTION;  Surgeon: Rozetta Nunnery, MD;  Location: [facility];  Service: ENT;  Laterality: Left;  . THYROIDECTOMY     Social History   Social History Narrative  . No narrative on file     Objective: Vital Signs: BP 120/76   Pulse  80   Resp 14   Wt 202 lb (91.6 kg)   BMI 40.80 kg/m    Physical Exam  Constitutional: She is oriented to person, place, and time. She appears well-developed and well-nourished.  HENT:  Head: Normocephalic and atraumatic.  Eyes: EOM are normal. Pupils are equal, round, and reactive to light.  Cardiovascular: Normal rate, regular rhythm and normal heart sounds.  Exam reveals no gallop and no friction rub.   No murmur heard. Pulmonary/Chest: Effort normal and breath sounds normal. She has no wheezes. She has no rales.  Abdominal:  Soft. Bowel sounds are normal. She exhibits no distension. There is no tenderness. There is no guarding. No hernia.  Musculoskeletal: Normal range of motion. She exhibits no edema, tenderness or deformity.  Lymphadenopathy:    She has no cervical adenopathy.  Neurological: She is alert and oriented to person, place, and time. Coordination normal.  Skin: Skin is warm and dry. Capillary refill takes less than 2 seconds. No rash noted.  Psychiatric: She has a normal mood and affect. Her behavior is normal.  Nursing note and vitals reviewed.    Musculoskeletal Exam:  Full range of motion of all joints Grip strength is equal and strong bilaterally Fibromyalgia tender points are all absent  CDAI Exam: CDAI Homunculus Exam:   Joint Counts:  CDAI Tender Joint count: 0 CDAI Swollen Joint count: 0     Investigation: Findings:  RAPID-3 shows a raw score of 21 with an index of 7, which is consistent with high severity.  The patient is unable to separate out her RA versus the fibromyalgia.  TB Gold done January 03, 2016, and was negative.  Labs from January 08, 2016, show CMP with GFR is normal.  CBC with diff is normal.  Patient has failed Humira and Simponi.  She has increased infections with Remicade and Xeljanz.  Admission on 04/08/2016, Discharged on 04/09/2016  Component Date Value Ref Range Status  . Sodium 04/04/2016 139  135 - 145 mmol/L Final  . Potassium 04/04/2016 3.6  3.5 - 5.1 mmol/L Final  . Chloride 04/04/2016 102  101 - 111 mmol/L Final  . CO2 04/04/2016 28  22 - 32 mmol/L Final  . Glucose, Bld 04/04/2016 142* 65 - 99 mg/dL Final  . BUN 04/04/2016 15  6 - 20 mg/dL Final  . Creatinine, Ser 04/04/2016 0.85  0.44 - 1.00 mg/dL Final  . Calcium 04/04/2016 8.4* 8.9 - 10.3 mg/dL Final  . GFR calc non Af Amer 04/04/2016 >60  >60 mL/min Final  . GFR calc Af Amer 04/04/2016 >60  >60 mL/min Final   Comment: (NOTE) The eGFR has been calculated using the CKD EPI equation. This  calculation has not been validated in all clinical situations. eGFR's persistently <60 mL/min signify possible Chronic Kidney Disease.   . Anion gap 04/04/2016 9  5 - 15 Final  . Glucose-Capillary 04/08/2016 128* 65 - 99 mg/dL Final  . Glucose-Capillary 04/08/2016 164* 65 - 99 mg/dL Final      Imaging: No results found.  Speciality Comments: No specialty comments available.    Procedures:  No procedures performed Allergies: Epinephrine   Assessment / Plan:     Visit Diagnoses: Rheumatoid arthritis with rheumatoid factor of multiple sites without organ or systems involvement (HCC)  High risk medication use - Orencia IV every 4 weeks  Fibromyalgia syndrome  Primary osteoarthritis of both hands  Acute midline low back pain, with sciatica presence unspecified -  Increased risk of heart  disease with RA.    DDD (degenerative disc disease), lumbar  History of hypertension  History of high cholesterol  Thyroid ca (HCC)  History of hypothyroidism  History of gastroesophageal reflux (GERD)  History of TMJ syndrome  Burning tongue syndrome  History of allergy  Other sleep apnea - CPAP  Vitamin D deficiency   Plan #1: Rheumatoid arthritis with positive rheumatoid factor. Has been on Orencia IV. That has been stopped since November 2017 until now. 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 It was removed by Dr. Lucia Gaskins approximately November 2017. (She also was taken off of methotrexate  since the last 1 year because of abnormal labs).  #2: High risk prescription Off of Orencia IV since November 2017 and continues to be off  off of methotrexate since the last 1 year due to her abnormal labs  not in any treatment at this time  #3: Fibromyalgia  #4: Return to clinic in 3 months At that time we will consider perhaps putting the patient on Plaquenil or Shafer. Patient was on methotrexate on 04/26/2015 visit. She was not  taking methotrexate on the 08/27/2015 visit. Review of SRS note shows that the creatinine had creeped up from 0.76-1.15 on the 05/01/2015 labs and therefore we discontinued her methotrexate see  below for the text message associated with those labs: ============ 05/02/2015 - 09:49 AM  From:  Bo Merino  To:  Dennie Maizes [R]   Please Review Priority: Normal  dc MTX.epeat BMP in 2weeks. we may have to switch meds.  05/02/2015 - 08:38 AM  From:  Dennie Maizes  To:  Bo Merino [R]   Please Review Priority: Normal  Please review and advise, patients Cr has gone up. In Nov. it was 0.76 and it is now 1.15. Thank you ===========================  Orders: No orders of the defined types were placed in this encounter.  No orders of the defined types were placed in this encounter.   Face-to-face time spent with patient was 40 minutes. 50% of time was spent in counseling and coordination of care.  Follow-Up Instructions: No Follow-up on file.   Eliezer Lofts, PA-C  Note - This record has been created using [employer].  Chart creation errors have been sought, but may not always  have been located. Such creation errors do not reflect on  the standard of medical care.

## 2016-08-13 DIAGNOSIS — G4733 Obstructive sleep apnea (adult) (pediatric): Secondary | ICD-10-CM | POA: Diagnosis not present

## 2016-08-14 ENCOUNTER — Encounter: Payer: Self-pay | Admitting: Rheumatology

## 2016-08-14 ENCOUNTER — Ambulatory Visit (INDEPENDENT_AMBULATORY_CARE_PROVIDER_SITE_OTHER): Payer: 59 | Admitting: Rheumatology

## 2016-08-14 VITALS — BP 120/76 | HR 80 | Resp 14 | Wt 202.0 lb

## 2016-08-14 DIAGNOSIS — Z8679 Personal history of other diseases of the circulatory system: Secondary | ICD-10-CM | POA: Diagnosis not present

## 2016-08-14 DIAGNOSIS — M5136 Other intervertebral disc degeneration, lumbar region: Secondary | ICD-10-CM

## 2016-08-14 DIAGNOSIS — Z79899 Other long term (current) drug therapy: Secondary | ICD-10-CM | POA: Diagnosis not present

## 2016-08-14 DIAGNOSIS — M19042 Primary osteoarthritis, left hand: Secondary | ICD-10-CM

## 2016-08-14 DIAGNOSIS — Z8739 Personal history of other diseases of the musculoskeletal system and connective tissue: Secondary | ICD-10-CM

## 2016-08-14 DIAGNOSIS — C73 Malignant neoplasm of thyroid gland: Secondary | ICD-10-CM | POA: Diagnosis not present

## 2016-08-14 DIAGNOSIS — Z8639 Personal history of other endocrine, nutritional and metabolic disease: Secondary | ICD-10-CM

## 2016-08-14 DIAGNOSIS — M19041 Primary osteoarthritis, right hand: Secondary | ICD-10-CM

## 2016-08-14 DIAGNOSIS — M545 Low back pain: Secondary | ICD-10-CM

## 2016-08-14 DIAGNOSIS — G4739 Other sleep apnea: Secondary | ICD-10-CM

## 2016-08-14 DIAGNOSIS — M51369 Other intervertebral disc degeneration, lumbar region without mention of lumbar back pain or lower extremity pain: Secondary | ICD-10-CM

## 2016-08-14 DIAGNOSIS — Z889 Allergy status to unspecified drugs, medicaments and biological substances status: Secondary | ICD-10-CM

## 2016-08-14 DIAGNOSIS — M0579 Rheumatoid arthritis with rheumatoid factor of multiple sites without organ or systems involvement: Secondary | ICD-10-CM | POA: Diagnosis not present

## 2016-08-14 DIAGNOSIS — Z8719 Personal history of other diseases of the digestive system: Secondary | ICD-10-CM | POA: Diagnosis not present

## 2016-08-14 DIAGNOSIS — M797 Fibromyalgia: Secondary | ICD-10-CM

## 2016-08-14 DIAGNOSIS — K146 Glossodynia: Secondary | ICD-10-CM

## 2016-08-14 DIAGNOSIS — E559 Vitamin D deficiency, unspecified: Secondary | ICD-10-CM

## 2016-08-15 ENCOUNTER — Telehealth (HOSPITAL_BASED_OUTPATIENT_CLINIC_OR_DEPARTMENT_OTHER): Payer: Self-pay | Admitting: Surgery

## 2016-08-15 NOTE — Progress Notes (Signed)
Called and spoke with patient to confirm her colonoscopy with Dr.Becker on  April 27 with an arrival time at 7:40.    Reviewed the prep instructions, patient verbalized understanding and has a hard copy of the instructions for reference.    Patient was given the office number to call for any questions.

## 2016-08-15 NOTE — Progress Notes (Signed)
Called and spoke with patient to confirm her colonoscopy with Dr Deneise Lever on April 27 with an arrival time at 7am.  Patient states the Golytely prep makes her sick and that she will be calling her PCP today to get an order for Miralax and Ducalox tabs.   Reviewed the prep instructions, patient verbalized understanding.   Patients daughter will be driving her home after her procedure.  Patient was given the office number to call for any questions.

## 2016-08-19 ENCOUNTER — Other Ambulatory Visit (HOSPITAL_BASED_OUTPATIENT_CLINIC_OR_DEPARTMENT_OTHER): Payer: Self-pay

## 2016-08-19 MED ORDER — BISACODYL 5 MG PO TBEC: 20 mg | tablet | Freq: Once | ORAL | 0 refills | 0 days | Status: AC

## 2016-08-19 MED ORDER — POLYETHYLENE GLYCOL 3350 17 GM/SCOOP PO POWD
238.0000 g | Freq: Once | ORAL | 0 refills | Status: DC
Start: 2016-08-19 — End: 2017-09-01

## 2016-08-19 MED ORDER — BISACODYL 5 MG PO TBEC
20.00 mg | DELAYED_RELEASE_TABLET | Freq: Once | ORAL | 0 refills | Status: AC
Start: 2016-08-19 — End: 2016-08-19

## 2016-08-19 MED ORDER — POLYETHYLENE GLYCOL 3350 PO POWD: 238 g | g | Freq: Once | ORAL | 0 refills | 0 days | Status: DC

## 2016-08-19 NOTE — Telephone Encounter (Signed)
-----   Message from Stevenson Clinch sent at 08/19/2016  3:22 PM EDT -----  Regarding: Laxative for colonoscopy   Contact: [place] [phone], 61 year old, female    Calls today:  Clinical Questions ([place])    Name of person calling pt   Specific nature of request Call for RN in order to explain prescription for laxative due to colonoscopy   Return phone number [phone]  Person calling on behalf of patient: Patient (self)    Patient's language of care: English    Patient does not need an interpreter.    Patient's PCP: [name], MD

## 2016-08-19 NOTE — Progress Notes (Signed)
Called patient back, she reports she was not able to tolerate golytely prep, was advised to use dulcolax and miralax OTC, patient reports she cannot afford these items otc, embarrassed to let GI office know, would like pcp to send them to [facility] for her. Has colonoscopy on Friday, needs meds for Thursday.    Also requested we cancel appt for Thursday since this is when she is to start prep, appt re-scheduled to next week.           Located instructions in encounter from 4/4

## 2016-08-21 ENCOUNTER — Ambulatory Visit (HOSPITAL_BASED_OUTPATIENT_CLINIC_OR_DEPARTMENT_OTHER): Payer: Commercial Managed Care - HMO | Admitting: Internal Medicine

## 2016-08-22 ENCOUNTER — Encounter (HOSPITAL_BASED_OUTPATIENT_CLINIC_OR_DEPARTMENT_OTHER): Payer: Self-pay | Admitting: Surgery

## 2016-08-22 ENCOUNTER — Ambulatory Visit (HOSPITAL_BASED_OUTPATIENT_CLINIC_OR_DEPARTMENT_OTHER)
Admit: 2016-08-22 | Disposition: A | Discharge status: Home / Self Care | Payer: Self-pay | Source: Ambulatory Visit | Attending: Surgery | Admitting: Surgery

## 2016-08-22 HISTORY — DX: Chronic obstructive pulmonary disease, unspecified: J44.9

## 2016-08-22 MED ORDER — LACTATED RINGERS IV SOLN
INTRAVENOUS | Status: DC
Start: 2016-08-22 — End: 2016-08-22
  Administered 2016-08-22: 08:00:00 via INTRAVENOUS

## 2016-08-22 MED ORDER — ONDANSETRON HCL 4 MG/2ML IJ SOLN
4.0000 mg | INTRAMUSCULAR | Status: DC | PRN
Start: 2016-08-22 — End: 2016-08-22

## 2016-08-22 MED ORDER — LIDOCAINE HCL (PF) 2 % IJ SOLN
INTRAMUSCULAR | Status: AC
Start: 2016-08-22 — End: 2016-08-22
  Filled 2016-08-22: qty 2

## 2016-08-22 MED ORDER — LACTATED RINGERS IV SOLN
INTRAVENOUS | Status: DC
Start: 2016-08-22 — End: 2016-08-22

## 2016-08-22 MED ORDER — PROPOFOL 200 MG/20ML IV EMUL
INTRAVENOUS | Status: AC
Start: 2016-08-22 — End: 2016-08-22
  Filled 2016-08-22: qty 40

## 2016-08-22 MED ORDER — EPHEDRINE SULFATE 50 MG/ML IJ SOLN (SUPER ERX)
INTRAMUSCULAR | Status: AC
Start: 2016-08-22 — End: 2016-08-22
  Filled 2016-08-22: qty 1

## 2016-08-22 MED ORDER — FLEET ENEMA 7-19 GM/118ML PR ENEM
1.0000 | ENEMA | Freq: Once | RECTAL | Status: DC
Start: 2016-08-22 — End: 2016-08-22

## 2016-08-22 MED ORDER — SODIUM CHLORIDE 0.9 % IJ SOLN
INTRAMUSCULAR | Status: AC
Start: 2016-08-22 — End: 2016-08-22
  Filled 2016-08-22: qty 10

## 2016-08-22 NOTE — Discharge Instructions (Signed)
Colonoscopy, Adult, Care After  This sheet gives you information about how to care for yourself after your procedure. Your health care provider may also give you more specific instructions. If you have problems or questions, contact your health care provider.  What can I expect after the procedure?  After the procedure, it is common to have:  · A small amount of blood in your stool for 24 hours after the procedure.  · Some gas.  · Mild abdominal cramping or bloating.    Follow these instructions at home:  General instructions     · For the first 24 hours after the procedure:  ? Do not drive or use machinery.  ? Do not sign important documents.  ? Do not drink alcohol.  ? Do your regular daily activities at a slower pace than normal.  ? Eat soft, easy-to-digest foods.  ? Rest often.  · Take over-the-counter or prescription medicines only as told by your health care provider.  · It is up to you to get the results of your procedure. Ask your health care provider, or the department performing the procedure, when your results will be ready.  Relieving cramping and bloating   · Try walking around when you have cramps or feel bloated.  · Apply heat to your abdomen as told by your health care provider. Use a heat source that your health care provider recommends, such as a moist heat pack or a heating pad.  ? Place a towel between your skin and the heat source.  ? Leave the heat on for 20-30 minutes.  ? Remove the heat if your skin turns bright red. This is especially important if you are unable to feel pain, heat, or cold. You may have a greater risk of getting burned.  Eating and drinking   · Drink enough fluid to keep your urine clear or pale yellow.  · Resume your normal diet as instructed by your health care provider. Avoid heavy or fried foods that are hard to digest.  · Avoid drinking alcohol for as long as instructed by your health care provider.  Contact a health care provider if:  · You have blood in your stool 2-3  days after the procedure.  Get help right away if:  · You have more than a small spotting of blood in your stool.  · You pass large blood clots in your stool.  · Your abdomen is swollen.  · You have nausea or vomiting.  · You have a fever.  · You have increasing abdominal pain that is not relieved with medicine.  This information is not intended to replace advice given to you by your health care provider. Make sure you discuss any questions you have with your health care provider.  Document Released: 11/27/2003 Document Revised: 01/07/2016 Document Reviewed: 06/26/2015  Elsevier Interactive Patient Education © 2017 [employer]

## 2016-08-22 NOTE — Progress Notes (Signed)
Anesthesia Post-Operative Evaluation Note    Patient: Cheryl Dixon    Procedure: colonoscopy, bx polyp          POST-OPERATIVE EVALUATION    Anesthesia Post Evaluation    Vitals signs in patient's normal range: Yes  Respiratory function stable; airway patent: Yes  Cardiovascular function stable: Yes  Hydration status stable: Yes  Mental status recovered; patient participates in evaluation and/or is at baseline: Yes  Pain control satisfactory: Yes  Nausea and vomiting control satisfactory: Yes
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
## 2016-08-22 NOTE — Progress Notes (Signed)
Last bowel movement per pt thick brown Dr. Deneise Lever aware Fleets done by pt, clear with some flecks of brown per pt.

## 2016-08-22 NOTE — PROVATION-GI (Signed)
[facility]  Patient Name: Cheryl Dixon  MRN: [id number]  Account Number: [id number]  Gender: Female  Age: 61  Date of Birth: 05-08-1955  Admit Type: Outpatient  Patient Location: [facility]  Note Status: Finalized  Referring MD:         Vincent Peyer, MD  Procedure Date:       08/22/2016 7:42:18 AM  Procedure:            Colonoscopy  Endoscopist:          Bernette Redbird, MD  Indications for Procedure:       Positive Cologuard test  Medications:          Sedation Required Anesthesia Staff Assistance  Procedure:       Just prior to the procedure, an updated history and physical was done. I        obtained an informed consent from the patient reviewing the risk of the        procedure including (but not limited to) respiratory depression, perforation,        bleeding, discomfort, a possible need for surgery and unexpected reactions to        medications. The patient is aware that test has limitations and may not        detect significant lesions such as cancer or other potential diseases. The        patient was also informed that they might need a repeat colonoscopy earlier        than standard guidelines if there are changes in their symptoms or concerning        findings noted. A time out was performed with the entire procedure staff        present. The scope was passed under direct vision. Throughout the procedure,        the patient's blood pressure, pulse, and oxygen saturations were monitored        continuously. The [id number] was introduced through the anus and        advanced to the cecum, identified by the appendiceal orifice, ileocecal valve        and palpation. The scope was then slowly withdrawn with confirmation of the        noted findings. The colonoscopy was performed without difficulty. The patient        tolerated the procedure well. The quality of the bowel preparation was poor.        Scope withdrawal time was 10 minutes.  Findings:       A small polyp was found at 60 cm proximal to the  anus. The polyp was removed        with a cold biopsy forceps. Resection and retrieval were complete.       The exam was otherwise without abnormality on direct and retroflexion views.  Post Procedure Diagnosis:       - Preparation of the colon was poor.       - One small polyp at 60 cm proximal to the anus, removed with a cold biopsy        forceps. Resected and retrieved.       - The examination was otherwise normal on direct and retroflexion views.  Complications:        No immediate complications.  Recommendation:       - Repeat colonoscopy in 1 year for surveillance.  Bernette Redbird, MD  08/22/2016 9:02:32 AM  This report has been signed electronically.  Number  of Addenda: 0  Note Initiated On: 08/22/2016 7:42 AM

## 2016-08-22 NOTE — H&P (Signed)
Pre-Anesthetic Note    Pre op Diagnosis: screening    Planned Procedure: colonoscopy    Patient ID:  Cheryl Dixon is a 61 year old female              Previous Anesthetic History:   Past Surgical History:  No date: ANES HRNA REPAIR UPR ABD TABDL RPR DIPHRG HRNA  No date: ANES IPER LOWER ABD W/LAPS RAD HYSTERECTOMY  No date: ENDOMETRIAL ABLTJ THERMAL W/O HYSTEROSCOPIC GID  No date: RPR 1ST [place] INFT [facility]  Patient denies complicationsof anesthesia.  Patient denies family complications of anesthesia.    Current Medications:      Current Outpatient Prescriptions:  buPROPion (WELLBUTRIN) 100 MG tablet Take 1 tablet by mouth 2 (two) times daily Disp: 60 tablet Rfl: 0   naproxen (EC NAPROSYN) 500 MG EC tablet Take 1 tablet by mouth 2 (two) times daily with meals Disp: 60 tablet Rfl: 0   ipratropium-albuterol (DUO-NEB) 0.5-2.5 (3) MG/3ML SOLN Inhalation Solution Take 3 mLs by nebulization 4 (four) times daily. Disp: 180 mL Rfl: 0   methadone (DOLOPHINE) 10 mg/mL solution Take 55 mg by mouth daily. Disp:  Rfl:    polyethylene glycol (GLYCOLAX/MIRALAX) packet Take 17 g by mouth daily. Disp:  Rfl:      No current facility-administered medications for this encounter.     Hospital Meds    PRN Meds          Allergies:   Review of Patient's Allergies indicates:   Darvon                     Meperidine hcl              Comment:Nausea/vomit   Paxil [paroxetine]      Rash   Zoloft [sertraline *    Rash    Smoking, Alcohol, Drugs:     Smoking status: Current Some Day Smoker  1.00 Packs/day  For 20.00 Years     Types: Cigarettes    Smokeless tobacco: Never Used    Comment: quit 1980-1996, then light until 2003, then 1 ppd since    Alcohol use No       Drug use: Yes   Comment: past opioids, nasal heroin, 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  MJ 1x per week       PMHx:  Past Medical History:  No date: Anxiety  No date: Arthritis  No date: Depression  No date: Obese  No date: Substance addiction ([address])  No date: Varicose veins of lower  extremities    PHYSICAL EXAMINATION:  LMP 11/20/1991    General: NAD    Airway Classification:  Mallampati: Class II   The same as Class I except the tonsilar pillars are hidden by the tounge.   Mallampati Airway Classification:   Dental Exam: upper denture  Oral Opening:  3cm  Full range of neck Motion:  Yes     Lungs: clear to auscultation bilaterally    Heart: normal and regular rate and rhythm    EKG/Echo:  Normal sinus rhythm    Nonspecific ST and T wave abnormality    Abnormal ECG    When compared with ECG of 16-Jul-2014 10:55,    No significant change was found      Referred By: Virgel Gess      Confirmed IO:NGEXBM THATAI         Pertinent Labs:     Lab Results  Component Value Date   NA 140 07/17/2014  K 4.2 07/17/2014   CREAT 1.0 07/17/2014   GLUCOSER 104 07/17/2014   WBC 9.0 07/17/2014   HCT 39.9 07/17/2014   PLTA 318 07/17/2014   PT 10.0 02/16/2007   APTT 26.1 02/16/2007   INR 1.0 (L) 02/16/2007        Other Findings: None    ASA Assessment: III  A Patient With Sever Systemic Disease That Limits but is not Incapacitating  Emergency:  No     Plan:  MAC    Post-op Plan: PACU    Interpreter used: No    Mina Marble, MD         Alanson Aly = Mandatory Field.  For non-mandatory fields, the provider will enter relevant positive findings.

## 2016-08-25 NOTE — Surgery Post-Op (Signed)
POST OP CALL:   Surgeon's name:  Deneise Lever, DAVID  Day of Surgery:  08/22/2016  Able to speak with Patient:  Left Message    Following Message left:    My name is Sharnita Bogucki and I am one of the nurses here at   [facility] calling to follow-up on the procedure you had with Dr. Deneise Lever on 08/22/16. Any problems or concerns contact us at [phone] or call Dr. Oley Balm office.

## 2016-08-26 LAB — SURGICAL PATH SPECIMEN

## 2016-08-27 ENCOUNTER — Encounter (HOSPITAL_BASED_OUTPATIENT_CLINIC_OR_DEPARTMENT_OTHER): Payer: Self-pay | Admitting: Internal Medicine

## 2016-08-27 ENCOUNTER — Ambulatory Visit (HOSPITAL_BASED_OUTPATIENT_CLINIC_OR_DEPARTMENT_OTHER): Payer: Commercial Managed Care - HMO | Admitting: Internal Medicine

## 2016-08-27 VITALS — BP 112/80 | HR 89 | Temp 97.0°F | Wt 213.0 lb

## 2016-08-27 DIAGNOSIS — F1120 Opioid dependence, uncomplicated: Secondary | ICD-10-CM

## 2016-08-27 DIAGNOSIS — M545 Low back pain, unspecified: Secondary | ICD-10-CM

## 2016-08-27 DIAGNOSIS — R195 Other fecal abnormalities: Secondary | ICD-10-CM

## 2016-08-27 DIAGNOSIS — F17200 Nicotine dependence, unspecified, uncomplicated: Secondary | ICD-10-CM

## 2016-08-27 DIAGNOSIS — F332 Major depressive disorder, recurrent severe without psychotic features: Secondary | ICD-10-CM

## 2016-08-27 DIAGNOSIS — D126 Benign neoplasm of colon, unspecified: Secondary | ICD-10-CM

## 2016-08-27 DIAGNOSIS — K635 Polyp of colon: Secondary | ICD-10-CM | POA: Insufficient documentation

## 2016-08-27 DIAGNOSIS — M79672 Pain in left foot: Secondary | ICD-10-CM

## 2016-08-27 DIAGNOSIS — K5903 Drug induced constipation: Secondary | ICD-10-CM

## 2016-08-27 DIAGNOSIS — G8929 Other chronic pain: Secondary | ICD-10-CM

## 2016-08-27 MED ORDER — POLYETHYLENE GLYCOL 3350 17 G PO PACK
17.0000 g | PACK | Freq: Every day | ORAL | 5 refills | Status: AC
Start: 2016-08-27 — End: 2016-11-25

## 2016-08-27 MED ORDER — CYCLOBENZAPRINE HCL 5 MG PO TABS: 5 mg | tablet | Freq: Three times a day (TID) | ORAL | 0 refills | 0 days | Status: AC | PRN

## 2016-08-27 MED ORDER — POLYETHYLENE GLYCOL 3350 PO PACK: 17 g | Package | Freq: Every day | ORAL | 5 refills | 0 days | Status: AC

## 2016-08-27 MED ORDER — CYCLOBENZAPRINE HCL 5 MG PO TABS
5.0000 mg | ORAL_TABLET | Freq: Three times a day (TID) | ORAL | 0 refills | Status: AC | PRN
Start: 2016-08-27 — End: 2016-09-10
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
## 2016-08-28 ENCOUNTER — Other Ambulatory Visit (HOSPITAL_BASED_OUTPATIENT_CLINIC_OR_DEPARTMENT_OTHER): Payer: Self-pay | Admitting: Social Worker

## 2016-08-28 NOTE — Progress Notes (Unsigned)
08/28/16-Thank you for your referral. Patient referral forwarded to Adair Laundry at the [facility] in [place] for further review and process. If you have any question regarding this referral, please feel free to contact the main office at [phone].

## 2016-08-29 NOTE — Progress Notes (Unsigned)
Referral to [facility]iat[facility]   Verify PCP  Verify coverage    Outreach to patient to schedule initial visit for   Psychopharm    BH Eval done for      Psychopharm: overdue      Appointment not scheduled:   Please make note:   Patient is added to wait list, Karle Starch has no openings for PSYCHOPHARM till early July.  This referral is being re-routed to Central Intake to request outreach to patient to offer an appointment.     Thank you,   Geoffery Spruce UA, 08/29/2016, 3:49 PM    [facility]  [address]  [place], [place]   Tel: [phone]

## 2016-09-02 DIAGNOSIS — K317 Polyp of stomach and duodenum: Secondary | ICD-10-CM | POA: Diagnosis not present

## 2016-09-03 ENCOUNTER — Telehealth (HOSPITAL_BASED_OUTPATIENT_CLINIC_OR_DEPARTMENT_OTHER): Payer: Self-pay | Admitting: Internal Medicine

## 2016-09-03 NOTE — Progress Notes (Signed)
I spoke with the patient and re-printed the Utility letter, had Dr.Roll signed it and faxed it. Got the confirmation.

## 2016-09-03 NOTE — Telephone Encounter (Signed)
-----   Message from [place] E Bohannon sent at 09/02/2016  3:50 PM EDT -----  Regarding: RE: letter   Letters are waiting for Dr. Lisa Roca to sign.   ----- Message -----  From: Madelon Lips  Sent: 09/02/2016   3:00 PM  To: [place] E Bohannon  Subject: FW: letter                                       Please assist and call the patient.    Sonam Wandel  ----- Message -----  From: Lars Pinks  Sent: 09/02/2016   1:12 PM  To: Raelyn Number Desk Pool  Subject: letter                                           [name] [id number], 61 year old, female    Calls today:  Clinical Questions ([place])    Specific nature of request states her gas company still has not received utility letter. Confirmed acct# [id number] and Fax #: [phone]. Pt wants it re-faxed asap and would like a call when done so she can call the company.   Return phone number [phone]  Person calling on behalf of patient: Patient (self)    Patient's language of care: English    Patient's PCP: [name], MD

## 2016-09-04 DIAGNOSIS — R05 Cough: Secondary | ICD-10-CM | POA: Diagnosis not present

## 2016-09-04 DIAGNOSIS — J4521 Mild intermittent asthma with (acute) exacerbation: Secondary | ICD-10-CM | POA: Diagnosis not present

## 2016-09-10 ENCOUNTER — Telehealth (HOSPITAL_BASED_OUTPATIENT_CLINIC_OR_DEPARTMENT_OTHER): Payer: Self-pay | Admitting: Mental Health

## 2016-09-10 NOTE — Progress Notes (Signed)
.  Cheryl Dixon, 09/10/2016, 11:45 AM    Writer reached out to patient in regards to access for psychiatry. Patient was amenable to calling outside clinics. Patient is also aware of being placed on waitlist for psychopharm.    For your convenience the following is a list of community behavioral health providers:  If you find that you are having difficulty with a referral or finding the right practice for you, please let us know, we would be happy to help  Please note that for all practices, people must be in regular therapy in order to be able to be followed by a psychiatrist.    [facility]  [phone] ([place])    [facility] has two local clinics.  You can go either call the central intake line first, or you can do a walk-in visit.  There is a 1-hour window every day when you can do a walk-in visit.  Please call the central intake office to find out the most up to date schedule for the walk-in clinics.  [facility], [place], [place], [place] or [place]  [address], [place], [place] [address]  [phone] is the intake line for the [facility]    Patient will reach out with any concerns

## 2016-09-11 DIAGNOSIS — K1122 Acute recurrent sialoadenitis: Secondary | ICD-10-CM | POA: Diagnosis not present

## 2016-09-17 ENCOUNTER — Telehealth (HOSPITAL_BASED_OUTPATIENT_CLINIC_OR_DEPARTMENT_OTHER): Payer: Self-pay | Admitting: Registered Nurse

## 2016-09-17 NOTE — Telephone Encounter (Signed)
-----   Message from Vito Backers sent at 09/17/2016 10:36 AM EDT -----  Regarding: Question about Referral  Contact: [phone]  Zamaya Rapaport [id number], 60 year old, female    Calls today:  Clinical Questions ([place])    Name of person calling Patient  Specific nature of request Requesting to speak with someone about getting a referral for a podiatrist. Pt states she should of had one done already.  Return phone number [phone]  Person calling on behalf of patient: Patient (self)        Patient's language of care: English    Patient does not need an interpreter.    Patient's PCP: [name], MD

## 2016-09-17 NOTE — Progress Notes (Signed)
Referral of podiatry noted in chart.   Pls assist with booking appt.   Thanks!

## 2016-09-28 ENCOUNTER — Other Ambulatory Visit: Payer: Self-pay | Admitting: Rheumatology

## 2016-09-29 ENCOUNTER — Telehealth (HOSPITAL_BASED_OUTPATIENT_CLINIC_OR_DEPARTMENT_OTHER): Payer: Self-pay | Admitting: Registered Nurse

## 2016-09-29 NOTE — Telephone Encounter (Signed)
-----   Message from Stevenson Clinch sent at 09/29/2016  2:42 PM EDT -----  Regarding: Returning call   Contact: [place] [phone], 61 year old, female    Calls today:  Clinical Questions ([place])    Name of person calling pt   Specific nature of request Returning call   Return phone number [phone]  Person calling on behalf of patient: Patient (self)    Patient's language of care: English    Patient does not need an interpreter.    Patient's PCP: [name], MD

## 2016-09-29 NOTE — Progress Notes (Signed)
Please note the patient is all set the miralax will be ready for pick up the single packages aren't cover pharmacy is dispensing the bottle.

## 2016-09-29 NOTE — Progress Notes (Signed)
Pt calling about a few things     1. Needs to know coverage of miralax    2. Requesting phone number to podiatry-phone number provided to pt    3. Methadone-pt is on 38mg  right now. Would like to get off med and see if she would qualify for our suboxone program. Message sent to [facility] for assistance.

## 2016-09-29 NOTE — Telephone Encounter (Signed)
Last Visit: 08/14/16 Next Visit: 11/20/16  Okay to refill Gabapentin?

## 2016-09-30 DIAGNOSIS — R5383 Other fatigue: Secondary | ICD-10-CM | POA: Diagnosis not present

## 2016-09-30 DIAGNOSIS — Z9009 Acquired absence of other part of head and neck: Secondary | ICD-10-CM | POA: Diagnosis not present

## 2016-09-30 DIAGNOSIS — Z139 Encounter for screening, unspecified: Secondary | ICD-10-CM | POA: Diagnosis not present

## 2016-10-01 ENCOUNTER — Other Ambulatory Visit (HOSPITAL_BASED_OUTPATIENT_CLINIC_OR_DEPARTMENT_OTHER): Payer: Self-pay

## 2016-10-01 NOTE — Progress Notes (Signed)
I left message for pt to call to discuss transition to suboxone.

## 2016-10-01 NOTE — Telephone Encounter (Signed)
LM for pt, in regards to miralax, appt needed, and mammo appt needed.       appt due: 09/24/2016) for Anxiety/ Depression/ SUD on Methadone, wants to change to Suboxone with Dr. Lisa Roca .       Unsigned Note      Please note the patient is all set the miralax will be ready for pick up the single packages aren't cover pharmacy is dispensing the bottle.               Cheryl Dixon [place], 10/01/2016, 9:47 AM    FYI RN <3  Left message :)

## 2016-10-01 NOTE — Progress Notes (Signed)
LMOVM  mammo needed and phq9 needed   Cheryl Dixon [place], 10/01/2016, 9:45 AM

## 2016-10-06 DIAGNOSIS — R799 Abnormal finding of blood chemistry, unspecified: Secondary | ICD-10-CM | POA: Diagnosis not present

## 2016-10-06 DIAGNOSIS — E039 Hypothyroidism, unspecified: Secondary | ICD-10-CM | POA: Diagnosis not present

## 2016-10-06 DIAGNOSIS — D510 Vitamin B12 deficiency anemia due to intrinsic factor deficiency: Secondary | ICD-10-CM | POA: Diagnosis not present

## 2016-10-06 DIAGNOSIS — R5382 Chronic fatigue, unspecified: Secondary | ICD-10-CM | POA: Diagnosis not present

## 2016-10-16 ENCOUNTER — Telehealth (HOSPITAL_BASED_OUTPATIENT_CLINIC_OR_DEPARTMENT_OTHER): Payer: Self-pay

## 2016-10-16 NOTE — Progress Notes (Signed)
Called home# spoke with patient states is currently on 38mg  of methadone.Was down to 33mg  but went into withdrawal symptoms. Requested to go back up . Patient will talk to counselor at methadone clinic and wants to start taper again. Explained that Dr. Lisa Roca recommends at least 30 mg or less she will be uncomfortable but will make an easier transition . Patient states she understands. Patient is also coming in to do UDS tox screen. Explained to patient currently not accepting patient for [place] program but [facility] is she agreed to [facility] . Explained that this will be sent to team then I will get back to her next week . Patient agreed with plan

## 2016-10-18 ENCOUNTER — Other Ambulatory Visit: Payer: Self-pay | Admitting: Rheumatology

## 2016-10-20 NOTE — Telephone Encounter (Signed)
Last Visit: 08/14/16 Next Visit: 11/20/16  Okay to refill Amitriptyline?

## 2016-10-20 NOTE — Telephone Encounter (Signed)
Okay to refill per Dr. Deveshwar.  

## 2016-11-03 ENCOUNTER — Telehealth (HOSPITAL_BASED_OUTPATIENT_CLINIC_OR_DEPARTMENT_OTHER): Payer: Self-pay | Admitting: Registered Nurse

## 2016-11-03 NOTE — Progress Notes (Signed)
Spoke with patient this morning in regards to her transitioning from methadone to suboxone.  She states she has had difficulty decreasing her dose and is now on 35mg .  She understands that she needs to come down to preferably 30 mg or below.  Pt states she will call as soon as she is ready to make an appointment with [place].

## 2016-11-14 ENCOUNTER — Telehealth (HOSPITAL_BASED_OUTPATIENT_CLINIC_OR_DEPARTMENT_OTHER): Payer: Self-pay | Admitting: Registered Nurse

## 2016-11-14 NOTE — Telephone Encounter (Signed)
-----   Message from Vito Backers sent at 11/14/2016  1:49 PM EDT -----  Regarding: Requesting callback  Contact: [phone]  Cheryl Dixon [id number], 60 year old, female    Calls today:  Clinical Questions ([place])    Name of person calling Patient  Specific nature of request Requesting to speak with one of Dr Silvana Newness nurses  Return phone number [phone]  Person calling on behalf of patient: Patient (self)        Patient's language of care: English    Patient does not need an interpreter.    Patient's PCP: [name], MD

## 2016-11-14 NOTE — Progress Notes (Signed)
Tc to pt inquiring about podiatry appt.   States L foot pain still persistent.  Referral placed by Marita [place], PA-C 2/18  Provided phone number to book.   If appt is too far out and symptoms worsens to book appt with PCP.   Lannie Fields, RN, 11/14/2016, 5:51 PM

## 2016-11-17 DIAGNOSIS — E039 Hypothyroidism, unspecified: Secondary | ICD-10-CM | POA: Diagnosis not present

## 2016-11-17 DIAGNOSIS — R5382 Chronic fatigue, unspecified: Secondary | ICD-10-CM | POA: Diagnosis not present

## 2016-11-17 DIAGNOSIS — D510 Vitamin B12 deficiency anemia due to intrinsic factor deficiency: Secondary | ICD-10-CM | POA: Diagnosis not present

## 2016-11-17 DIAGNOSIS — E559 Vitamin D deficiency, unspecified: Secondary | ICD-10-CM | POA: Diagnosis not present

## 2016-11-18 DIAGNOSIS — G4733 Obstructive sleep apnea (adult) (pediatric): Secondary | ICD-10-CM | POA: Diagnosis not present

## 2016-11-20 ENCOUNTER — Encounter: Payer: Self-pay | Admitting: Rheumatology

## 2016-11-20 ENCOUNTER — Telehealth: Payer: Self-pay | Admitting: Rheumatology

## 2016-11-20 ENCOUNTER — Ambulatory Visit (INDEPENDENT_AMBULATORY_CARE_PROVIDER_SITE_OTHER): Payer: 59 | Admitting: Rheumatology

## 2016-11-20 VITALS — BP 135/84 | HR 102 | Resp 16 | Ht 58.75 in | Wt 198.0 lb

## 2016-11-20 DIAGNOSIS — IMO0001 Reserved for inherently not codable concepts without codable children: Secondary | ICD-10-CM

## 2016-11-20 DIAGNOSIS — Z6841 Body Mass Index (BMI) 40.0 and over, adult: Secondary | ICD-10-CM | POA: Diagnosis not present

## 2016-11-20 DIAGNOSIS — E669 Obesity, unspecified: Secondary | ICD-10-CM | POA: Diagnosis not present

## 2016-11-20 DIAGNOSIS — Z79899 Other long term (current) drug therapy: Secondary | ICD-10-CM | POA: Diagnosis not present

## 2016-11-20 DIAGNOSIS — M1009 Idiopathic gout, multiple sites: Secondary | ICD-10-CM

## 2016-11-20 DIAGNOSIS — M0579 Rheumatoid arthritis with rheumatoid factor of multiple sites without organ or systems involvement: Secondary | ICD-10-CM | POA: Diagnosis not present

## 2016-11-20 MED ORDER — AMITRIPTYLINE HCL 25 MG PO TABS: 25.0000 mg | ORAL_TABLET | Freq: Every day | ORAL | 0 refills | Status: DC
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
## 2016-11-20 NOTE — Telephone Encounter (Signed)
Patient advised prescription sent to the pharmacy.  

## 2016-11-20 NOTE — Telephone Encounter (Signed)
Patient in office for an appointment today. Patient forgot to ask during her appointment if we could fill her amitriptyline for 90 days at a time instead of 30 days. Gabapentin was filled for 90 days on 09/29/16. Patient advised.   Okay to fill Amitriptyline for 90 supply?

## 2016-11-20 NOTE — Telephone Encounter (Signed)
ok 

## 2016-11-20 NOTE — Telephone Encounter (Signed)
Patient is requesting refills of a 90 day supply of Neurontin and amitriptyline. Please send to [facility]. She forgot to ask at her appointment today.

## 2016-11-20 NOTE — Progress Notes (Signed)
Office Visit Note  Patient: Michelle [place]             Date of Birth: 06-Sep-1955           MRN: [id number]             PCP: Antony Contras, MD Referring: Antony Contras, MD Visit Date: 11/20/2016 Occupation: @GUAROCC @    Subjective:  Rheumatoid Arthritis (Doing good) and Fibromyalgia   History of Present Illness: Michelle [place] is a 61 y.o. female  Who was last seen in our office on 08/14/2016 for rheumatoid arthritis with rheumatoid factor positive. At the last visit, we discussed that patient had abscess on the left side of the mouth and had left superficial parotidectomy with facial nerve dissection by Dr. Lucia Gaskins (surgeon) on December 2017. At that time, patient was off of Orencia due to the cancer of the left parotid. Patient was also off of methotrexate for about a year due to abnormal labs.  Patient has failed Humira and Simponi.  She has increased infections with Remicade and Xeljanz.  Today, we've asked the patient to return so we can consider putting the patient on Plaquenil or Arava.  Pt states she is feeling fine at the moment.  Seeing  Diagnosed w/ gout due to high uric acid level. On low dose naltrexone rx'd by dr. Golden Hurter biles ([place], [place] ==> [facility])  Wt loss efforts via [facility].  Uric acid at 8.2 on October 06, 2016.  Pt had swollen and painful right big toe. Was not hot or red. tx'd w/ indomethecin by dr Golden Hurter biles. Pt taking it prn.   No  Large flares since starting treatment. (but does have small flares due to using indomethecin intermittently).  At this point, pt not willing to try arava or plq since doing well.   Activities of Daily Living:  Patient reports morning stiffness for 10 minutes.   Patient Reports nocturnal pain.  Difficulty dressing/grooming: Denies Difficulty climbing stairs: Reports Difficulty getting out of chair: Denies Difficulty using hands for taps, buttons, cutlery, and/or writing:  Reports   Review of Systems  Constitutional: Negative for fatigue.  HENT: Negative for mouth sores and mouth dryness.   Eyes: Negative for dryness.  Respiratory: Negative for shortness of breath.   Gastrointestinal: Negative for constipation and diarrhea.  Musculoskeletal: Negative for myalgias and myalgias.  Skin: Negative for sensitivity to sunlight.  Psychiatric/Behavioral: Negative for decreased concentration and sleep disturbance.    PMFS History:  Patient Active Problem List   Diagnosis Date Noted  . Fibromyalgia syndrome 08/12/2016  . High risk medication use 08/12/2016  . Primary osteoarthritis of both hands 08/12/2016  . Acute midline low back pain 08/12/2016  . DDD (degenerative disc disease), lumbar 08/12/2016  . History of hypertension 08/12/2016  . History of high cholesterol 08/12/2016  . Thyroid ca ([place]) 08/12/2016  . History of hypothyroidism 08/12/2016  . History of gastroesophageal reflux (GERD) 08/12/2016  . History of TMJ syndrome 08/12/2016  . Burning tongue syndrome 08/12/2016  . History of allergy 08/12/2016  . Other sleep apnea 08/12/2016  . Vitamin D deficiency 08/12/2016  . Rheumatoid arthritis with rheumatoid factor of multiple sites without organ or systems involvement ([place]) 12/31/2015    Past Medical History:  Diagnosis Date  . Anxiety   . Arthritis   . Depression   . Fibromyalgia   . GERD (gastroesophageal reflux disease)   . Hyperlipidemia   . Hypertension   .  Hypothyroidism    thyroidectomy  . Neoplasm of parotid gland   . Neuromuscular disorder ([place])   . PONV (postoperative nausea and vomiting)   . Sleep apnea     Family History  Problem Relation Age of Onset  . Alzheimer's disease Mother   . Alzheimer's disease Sister    Past Surgical History:  Procedure Laterality Date  . ABDOMINAL HYSTERECTOMY    . CHOLECYSTECTOMY    . HAMMER TOE SURGERY    . PAROTIDECTOMY Left 04/08/2016   Procedure: LEFT SUPERFICIAL PAROTIDECTOMY WITH  FACIAL NERVE DISECTION;  Surgeon: Rozetta Nunnery, MD;  Location: [facility];  Service: ENT;  Laterality: Left;  . THYROIDECTOMY     Social History   Social History Narrative  . No narrative on file     Objective: Vital Signs: BP 135/84 (BP Location: Left Arm, Patient Position: Sitting, Cuff Size: Normal)   Pulse (!) 102   Resp 16   Ht 4' 10.75" (1.492 m)   Wt 198 lb (89.8 kg)   BMI 40.33 kg/m    Physical Exam  Constitutional: She is oriented to person, place, and time. She appears well-developed and well-nourished.  HENT:  Head: Normocephalic and atraumatic.  Eyes: Pupils are equal, round, and reactive to light. EOM are normal.  Cardiovascular: Normal rate, regular rhythm and normal heart sounds.  Exam reveals no gallop and no friction rub.   No murmur heard. Pulmonary/Chest: Effort normal and breath sounds normal. She has no wheezes. She has no rales.  Abdominal: Soft. Bowel sounds are normal. She exhibits no distension. There is no tenderness. There is no guarding. No hernia.  Musculoskeletal: Normal range of motion. She exhibits no edema, tenderness or deformity.  Lymphadenopathy:    She has no cervical adenopathy.  Neurological: She is alert and oriented to person, place, and time. Coordination normal.  Skin: Skin is warm and dry. Capillary refill takes less than 2 seconds. No rash noted.  Psychiatric: She has a normal mood and affect. Her behavior is normal.  Nursing note and vitals reviewed.    Musculoskeletal Exam:  Full range of motion of all joints Grip strength is equal and strong bilaterally For Melissa tender points are absent  CDAI Exam: CDAI Homunculus Exam:   Tenderness:  Right hand: 1st PIP, 2nd PIP and 3rd PIP  Swelling:  Right hand: 1st PIP, 2nd PIP and 3rd PIP  Joint Counts:  CDAI Tender Joint count: 3 CDAI Swollen Joint count: 3  Global Assessments:  Patient Global Assessment: 8 Provider Global Assessment: 8  CDAI  Calculated Score: 22 Pain to the right first second and third PIP joint is rated 8 on a scale of 0-10 on a bad day when it's flaring (no redness or warmth) and about a 3 on a good day. The swelling has been there from the beginning and this is old damage. Patient is not having any new issues with those 3 joints at this time.   Investigation: No additional findings. Patient brings in labs ordered by [employer] Aaron Edelman L's nurse practitioner at [place] [facility] [place], [place]. Significant lab values are as follows====> Please see labs scan in her chart Hemoglobin A1c elevated at 6.3 Uric acid elevated at 8.2 Ferritin elevated at 161 C-reactive protein elevated to 6.97 Decreased TSH at 0.056 Elevated refer to T3 at 37.1 Please see additional lab results in computer.   Imaging: No results found.  Speciality Comments: No specialty comments available.  Procedures:  No procedures performed Allergies: Epinephrine   Assessment / Plan:     Visit Diagnoses: Rheumatoid arthritis with rheumatoid factor of multiple sites without organ or systems involvement ([place])  High risk medication use - None at this time. No flare. Also Orencia IV, failed multiple Biologics in the past.  Idiopathic gout of multiple sites, unspecified chronicity  Class 3 obesity with body mass index (BMI) of 40.0 to 44.9 in adult, unspecified obesity type, unspecified whether serious comorbidity present ([place])   Plan: #1: Rheumatoid arthritis with positive rheumatoid factor. No flares at this time. We have asked the patient to come back today to consider using either Plaquenil or Arava to control her rheumatoid arthritis. She is not flared at all and she does not think that she needs any more medication. In addition, she is using holistic medicine/integrative medicine from [facility] in [place] to address some of her current medical needs. She would like to give  that a 3 month trial and if she feels that she is happy to come back to our office to discuss DMARD therapy for her rheumatoid arthritis.  #2: High risk prescription:===> None Off of Orencia IV because patient had surgery of abscess and has stayed off the medication and has not flared. Prior to [place] IV, patient has failed multiple Biologics in the past. Patient has failed Humira and Simponi.  She has increased infections with Remicade and Xeljanz. She's also been diagnosed with thyroid cancer back in 2006.  #3: Obesity. Patient is making efforts for weight loss seeing Dr. Tessa Lerner.  #4: Recent uric acid at her integrative therapy doctor's office was 8.2. Patient was having right great toe pain more so than left great toe. He was diagnosed with gout by that doctor and started on indomethacin. Patient states that she takes indomethacin intermittently on a when necessary basis. She knows that she should take it regularly but she does not want to.  #5: Her integrative therapy doctor has started her on low-dose no Otrexup to help her with inflammation.  #6: Return to clinic in 3 months. On that visit, we will see if she would like to restart a DMARD. She was asymptomatic today in the office with no synovitis.  Orders: No orders of the defined types were placed in this encounter.  No orders of the defined types were placed in this encounter.   Face-to-face time spent with patient was 30 minutes. Greater than 50% of time was spent in counseling and coordination of care.  Follow-Up Instructions: Return in about 3 months (around 02/20/2017) for RA,rf+,integrative therapy, no hrrx from us,gout.   Eliezer Lofts, PA-C Patient had no synovitis on my exam today. She appears to be in remission. She's been advised to contact us in case she develops any new symptoms.I examined and evaluated the patient with Eliezer Lofts PA. The plan of care was discussed as noted above.  Bo Merino,  MD Note - This record has been created using Editor, commissioning.  Chart creation errors have been sought, but may not always  have been located. Such creation errors do not reflect on  the standard of medical care.

## 2016-11-24 ENCOUNTER — Ambulatory Visit (HOSPITAL_BASED_OUTPATIENT_CLINIC_OR_DEPARTMENT_OTHER): Payer: Commercial Managed Care - HMO | Admitting: Physician Assistant

## 2016-11-26 DIAGNOSIS — R799 Abnormal finding of blood chemistry, unspecified: Secondary | ICD-10-CM | POA: Diagnosis not present

## 2016-11-26 DIAGNOSIS — D510 Vitamin B12 deficiency anemia due to intrinsic factor deficiency: Secondary | ICD-10-CM | POA: Diagnosis not present

## 2016-11-26 DIAGNOSIS — E559 Vitamin D deficiency, unspecified: Secondary | ICD-10-CM | POA: Diagnosis not present

## 2016-11-27 ENCOUNTER — Encounter (HOSPITAL_BASED_OUTPATIENT_CLINIC_OR_DEPARTMENT_OTHER): Payer: Self-pay | Admitting: Physician Assistant

## 2016-11-27 ENCOUNTER — Ambulatory Visit (HOSPITAL_BASED_OUTPATIENT_CLINIC_OR_DEPARTMENT_OTHER): Payer: Commercial Managed Care - HMO | Admitting: Physician Assistant

## 2016-11-27 VITALS — BP 134/81 | HR 74 | Temp 97.7°F | Wt 207.0 lb

## 2016-11-27 DIAGNOSIS — F1120 Opioid dependence, uncomplicated: Secondary | ICD-10-CM

## 2016-11-27 DIAGNOSIS — Z1239 Encounter for other screening for malignant neoplasm of breast: Secondary | ICD-10-CM

## 2016-11-27 DIAGNOSIS — F17200 Nicotine dependence, unspecified, uncomplicated: Secondary | ICD-10-CM

## 2016-11-27 DIAGNOSIS — M79672 Pain in left foot: Secondary | ICD-10-CM

## 2016-11-27 DIAGNOSIS — F332 Major depressive disorder, recurrent severe without psychotic features: Secondary | ICD-10-CM

## 2016-11-27 MED ORDER — BUPROPION HCL ER (SR) 150 MG PO TB12: 150 mg | tablet | Freq: Two times a day (BID) | ORAL | 2 refills | 0 days | Status: DC

## 2016-11-27 MED ORDER — BUPROPION HCL ER (SR) 150 MG PO TB12
150.0000 mg | ORAL_TABLET | Freq: Two times a day (BID) | ORAL | 2 refills | Status: DC
Start: 2016-11-27 — End: 2017-08-18
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
## 2016-11-29 ENCOUNTER — Other Ambulatory Visit (HOSPITAL_BASED_OUTPATIENT_CLINIC_OR_DEPARTMENT_OTHER): Payer: Self-pay | Admitting: Medical

## 2016-11-29 DIAGNOSIS — F17200 Nicotine dependence, unspecified, uncomplicated: Secondary | ICD-10-CM

## 2016-11-30 NOTE — Progress Notes (Signed)
PER Pharmacy, Cheryl Dixon is a 61 year old female has requested a refill of NICOTINE PATCH.        Last Office Visit: 11/27/2016 with A. Nikki Dom  Last Physical Exam: 05/27/2013        Other Med Adult:  Most Recent BP Reading(s)  11/27/16 : 134/81          Cholesterol (mg/dl)   Date Value   01/04/2010 264 (H)   ----------    LOW DENSITY LIPOPROTEIN DIRECT (mg/dl)   Date Value   01/04/2010 101 (H)   ----------    HIGH DENSITY LIPOPROTEIN (mg/dl)   Date Value   01/04/2010 38   ----------  No results found for: TG        THYROID SCREEN TSH REFLEX FT4 (uIU/mL)   Date Value   05/27/2013 2.130   ----------      No results found for: TSH      HEMOGLOBIN A1C (%)   Date Value   05/27/2013 5.9 (H)   ----------    No results found for: POCA1C        INR (no units)   Date Value   02/16/2007 1.0 (L)   07/03/2006 < 1.0 (L)   ----------      SODIUM (mmol/L)   Date Value   07/17/2014 140   ----------      POTASSIUM (mmol/L)   Date Value   07/17/2014 4.2   ----------          CREATININE (mg/dL)   Date Value   07/17/2014 1.0   ----------     Documented patient preferred pharmacies:    [place], [place], [place]  Phone: [phone] Fax: [phone]

## 2016-12-08 ENCOUNTER — Ambulatory Visit: Payer: Self-pay | Admitting: Physician Assistant

## 2016-12-15 ENCOUNTER — Ambulatory Visit (HOSPITAL_BASED_OUTPATIENT_CLINIC_OR_DEPARTMENT_OTHER): Payer: Commercial Managed Care - HMO | Admitting: Podiatrist

## 2016-12-19 ENCOUNTER — Ambulatory Visit (HOSPITAL_BASED_OUTPATIENT_CLINIC_OR_DEPARTMENT_OTHER): Payer: Commercial Managed Care - HMO | Admitting: Podiatrist

## 2016-12-24 ENCOUNTER — Encounter (HOSPITAL_BASED_OUTPATIENT_CLINIC_OR_DEPARTMENT_OTHER): Payer: Self-pay

## 2016-12-25 ENCOUNTER — Telehealth (HOSPITAL_BASED_OUTPATIENT_CLINIC_OR_DEPARTMENT_OTHER): Payer: Self-pay | Admitting: Internal Medicine

## 2016-12-25 NOTE — Telephone Encounter (Signed)
-----   Message from Elijah Birk sent at 12/25/2016 10:10 AM EDT -----  Regarding: requesting utility letter be re faxed.   Contact: [place] [phone], 61 year old, female    Calls today:  Clinical Questions ([place])    Name of person calling Jolayne  Specific nature of request requesting that utility letter be faxed over again, electric company states they did not receive it.   Return phone number [phone]  Person calling on behalf of patient: Patient (self)    Patient's language of care: English    Patient does not need an interpreter.    Patient's PCP: [name], MD

## 2016-12-26 NOTE — Progress Notes (Signed)
I spoke with the patient and had the PCP sign the Utility letter and faxed it.Shruti Arrey, 12/26/2016, 9:35 AM

## 2016-12-30 ENCOUNTER — Telehealth (HOSPITAL_BASED_OUTPATIENT_CLINIC_OR_DEPARTMENT_OTHER): Payer: Self-pay | Admitting: Registered Nurse

## 2016-12-30 ENCOUNTER — Telehealth (HOSPITAL_BASED_OUTPATIENT_CLINIC_OR_DEPARTMENT_OTHER): Payer: Self-pay

## 2016-12-30 NOTE — Telephone Encounter (Signed)
-----   Message from Reymundo Poll sent at 12/30/2016  9:32 AM EDT -----  Regarding: has no electricity, states needs pcp to call electric company  Contact: [place] [phone], 61 year old, female    Calls today:  Clinical Questions ([place])    Name of person calling Tannah  Specific nature of request has no electricity, states needs pcp to call electric company to have it turned back on [phone], they did receive the letters but didn't consider them.. Please call pt with update  Return phone [phone]  Person calling on behalf of patient: Patient (self)    Patient's language of care: English    Patient does not need an interpreter.    Patient's PCP: [name], MD

## 2016-12-30 NOTE — Telephone Encounter (Signed)
-----   Message from Elijah Birk sent at 12/30/2016  4:03 PM EDT -----  Regarding: requesting call back - utility   Contact: [place] [phone], 61 year old, female    Calls today:  Clinical Questions ([place])    Name of person calling Cheryl Dixon  Specific nature of request has no electricity, states needs pcp to call electric company to have it turned back on [phone], they did receive the letters but didn't consider them.. Please call pt with update  Return phone [phone]  Person calling on behalf of patient: Patient (self)    Patient's language of care: English    Patient does not need an interpreter.    Patient's PCP: [name], MD

## 2016-12-30 NOTE — Progress Notes (Signed)
Called and spoke with patient she reports she has been Dentist with multiple items of documentation but they turned her electricity off on Friday, she reports they did receive letter from provider. She is on her way there now to let them now she did receive notification of getting some other assistance for bills. If this does not help she will get more information of what is needed and call clinic back.

## 2016-12-31 NOTE — Telephone Encounter (Signed)
-----   Message from Valentina Gu sent at 12/31/2016 10:16 AM EDT -----  Regarding: no electricity for 8 days   Contact: [phone]  Calls today:  Clinical Questions (NON-SICK CLINICAL QUESTIONS ONLY)    Name of person calling [name]  Specific nature of request has no electricity, states needs to speak with pcp if he isn't available will speak to anyone.  Please call pt with update. Thank you   Return phone [phone]  Person calling on behalf of patient: Patient (self)    Patient's language of care: English    Patient does not need an interpreter.    Patient's PCP: [name], MD

## 2016-12-31 NOTE — Progress Notes (Addendum)
Called and spoke with patient she conintues without electricity, reports she has been calling, they told her yesterday that they had no letters on file, she has told them several times she needs to use nebulizer, she is also trying to get a grant to get $4000 to assist with bills, will be speaking to them again tomorrow.    Asks if we can contact [employer], she provided 2 numbers: [phone] and [phone], advised I would try.    Called [employer], they could not confirm anything regarding patient's account due to privacy, confirmed fax number as [phone], will re-fax letter now.      Called patient back and advised her of this.

## 2017-01-01 ENCOUNTER — Telehealth (HOSPITAL_BASED_OUTPATIENT_CLINIC_OR_DEPARTMENT_OTHER): Payer: Self-pay

## 2017-01-01 NOTE — Telephone Encounter (Signed)
-----   Message from Stevenson Clinch sent at 01/01/2017  1:36 PM EDT -----  Regarding: Dr. note to be faxed   Contact: [phone]  Alianah Lofton [phone], 61 year old, female    Calls today:  Clinical Questions ([place])    Name of person calling pt   Specific nature of request would like to have dr. Note fax over Joylene Igo [phone] [facility]   Return phone number [phone]  Person calling on behalf of patient: Patient (self)    Patient's language of care: English    Patient does not need an interpreter.    Patient's PCP: [name], MD

## 2017-01-01 NOTE — Progress Notes (Addendum)
Patient back, she is working with Earnest Bailey at a company called [employer] that will assist her with bills, she is requesting electric utility letter be faxed to her.   letter printed, signed by provider, and placed out to be faxed.

## 2017-01-05 ENCOUNTER — Ambulatory Visit (HOSPITAL_BASED_OUTPATIENT_CLINIC_OR_DEPARTMENT_OTHER): Payer: Commercial Managed Care - HMO | Admitting: Physician Assistant

## 2017-01-05 ENCOUNTER — Encounter (HOSPITAL_BASED_OUTPATIENT_CLINIC_OR_DEPARTMENT_OTHER): Payer: Self-pay | Admitting: Physician Assistant

## 2017-01-05 VITALS — BP 136/80 | HR 65 | Temp 97.6°F | Wt 210.0 lb

## 2017-01-05 DIAGNOSIS — R1013 Epigastric pain: Secondary | ICD-10-CM

## 2017-01-05 DIAGNOSIS — IMO0001 Reserved for inherently not codable concepts without codable children: Secondary | ICD-10-CM

## 2017-01-05 DIAGNOSIS — F17200 Nicotine dependence, unspecified, uncomplicated: Secondary | ICD-10-CM

## 2017-01-05 DIAGNOSIS — F1120 Opioid dependence, uncomplicated: Secondary | ICD-10-CM

## 2017-01-05 DIAGNOSIS — F332 Major depressive disorder, recurrent severe without psychotic features: Secondary | ICD-10-CM

## 2017-01-05 DIAGNOSIS — M170 Bilateral primary osteoarthritis of knee: Secondary | ICD-10-CM

## 2017-01-05 MED ORDER — DOCUSATE SODIUM 100 MG PO CAPS
100.00 mg | ORAL_CAPSULE | Freq: Two times a day (BID) | ORAL | 11 refills | Status: AC
Start: 2017-01-05 — End: 2018-01-05

## 2017-01-05 MED ORDER — DOCUSATE SODIUM 100 MG PO CAPS: 100 mg | capsule | Freq: Two times a day (BID) | ORAL | 11 refills | 0 days | Status: AC

## 2017-01-05 NOTE — Progress Notes (Signed)
Cheryl Dixon is a 61 year old female pt of Dr. [name], MD who presents for f/u depression       Depression   Started wellbutrin 150mg  BID  Was helping, but not as much any more   Has a lot of stress currently, having trouble with electric company   Some passive SI, but states this is long standing  Denies any intent or plan to harm herself       Opioid dependence   Taking methadone 31mg  daily, has been tapering down   Plans to switch to suboxone at [facility] soon   Has some constipation       OA  Knees bilateral  Gets a lot of pain, worse since she has been tapering down on methadone  Had injections in the past, thinks this was helpful         Epigastric pain   History of epigastic pain for several years  Seen by GI in the past, plan was for EGD but was never completed  Has not seen GI since 2015    Having intermittent pains currently   Denies fever, N/V         Smoking  Quit for about 5 days  Restarted, now about 6 cigarettes daily         BP 136/80  Pulse 65  Temp 97.6 F (36.4 C) (Temporal)  Wt 95.3 kg (210 lb)  LMP 11/20/1991  SpO2 98%  BMI 33.89 kg/m2              Physical Exam   Constitutional: She appears well-developed and well-nourished. No distress.   HENT:   Head: Normocephalic and atraumatic.   Mouth/Throat: Oropharynx is clear and moist. No oropharyngeal exudate.   Cardiovascular: Normal rate, regular rhythm and normal heart sounds.   No murmur heard.  Pulmonary/Chest: Effort normal and breath sounds normal. No respiratory distress. She has no wheezes. She has no rales.   Psychiatric: She exhibits a depressed mood.   Vitals reviewed.        ASSESSMENT/PLAN          (F33.2) Severe episode of recurrent major depressive disorder, without psychotic features ([place])  (primary encounter diagnosis)  Comment: Some improvement with wellbutrin but still symptomatic. Reports passive SI, but denies any intent or plan - reports she would not want to hurt her family by hurting herself. I do not think she is at immediate  risk of harming herself at this time. She would like a referral to psychopharmacology, will place today.  Plan: REFERRAL TO ADULT OUTPT PSYCHIATRY FOR [place] [facility]         PTS        Pt educated on warning signs requiring urgent evaluation         F/u about 2 months        (F11.20) Opioid dependence on agonist therapy ([place])  Comment: Wants to switch to suboxone, working on tapering down. Per pt she has contact info for [facility] to call and discuss plan for switching over. Start colace for constipation   Plan: docusate sodium (COLACE) 100 MG capsule            Advised to call and discuss scheduling appt with [facility] providers [facility]        (M17.0) Primary osteoarthritis of both knees  Comment: will refer to ortho to discuss steroid injections   Plan: REFERRAL TO ORTHOPEDICS ( INT)              (  R10.13) Epigastric pain  Comment: never completed EGD, will refer to GI to schedule  Plan: REFERRAL TO GASTROENTEROLOGY ( INT)              (F17.200) Smoking  Comment: has cut down, states wellbutrin was helpful with this    Plan: Continue wellbutrin             Work on complete cessation              F/u at next visit        We discussed the patients current medications. The patient expressed understanding and no barriers to adherence were identified.   1. The patient indicates understanding of these issues and agrees with the plan. Brief care plan is updated and reviewed with the patient.   2. The patient is given an After Visit Summary sheet that lists all medications with directions, allergies, orders placed during this encounter, and follow-up instructions.   3. I reviewed the patient's medical information and medical history   4. I reconciled the patient's medication list and prepared and supplied needed refills.   5. I have reviewed the past medical, family, and social history sections including the medications and allergies.

## 2017-01-06 ENCOUNTER — Telehealth (HOSPITAL_BASED_OUTPATIENT_CLINIC_OR_DEPARTMENT_OTHER): Payer: Self-pay | Admitting: Registered Nurse

## 2017-01-06 DIAGNOSIS — I10 Essential (primary) hypertension: Secondary | ICD-10-CM | POA: Diagnosis not present

## 2017-01-06 DIAGNOSIS — E782 Mixed hyperlipidemia: Secondary | ICD-10-CM | POA: Diagnosis not present

## 2017-01-06 DIAGNOSIS — G4700 Insomnia, unspecified: Secondary | ICD-10-CM | POA: Diagnosis not present

## 2017-01-06 DIAGNOSIS — Z23 Encounter for immunization: Secondary | ICD-10-CM | POA: Diagnosis not present

## 2017-01-06 NOTE — Telephone Encounter (Signed)
-----   Message from Rhae Lerner sent at 01/06/2017  3:46 PM EDT -----  Regarding: Copy of letter  Contact: [phone]  Davianna Deutschman [id number], 61 year old, female    Calls today:  Clinical Questions ([place])    Name of person calling Sterling Big  Specific nature of request copy of utility letter that provider will send,to be fax to him  Return phone number fax [phone]/phone [phone]  Person calling on behalf of [employer] :     Patient's language of care: English    Patient does not need an interpreter.    Patient's PCP: [name], MD

## 2017-01-06 NOTE — Telephone Encounter (Signed)
-----   Message from Sophronia Simas sent at 01/06/2017  3:58 PM EDT -----  Regarding: update on letter  Contact: [phone]  Cheryl Dixon [phone], 61 year old, female    Calls today:  Clinical Questions ([place])    Name of person calling Sterling Big  Specific nature of request Sterling Big requesting update on utility letter that he had requested to be faxed to him. Verified fax number with Sterling Big and a new fax number was given: [phone].  Return phone number [phone]    Patient's language of care: English    Patient does not need an interpreter.    Patient's PCP: [name], MD

## 2017-01-06 NOTE — Telephone Encounter (Signed)
-----   Message from Sophronia Simas sent at 01/06/2017 12:18 PM EDT -----  Regarding: [place]: [phone]  Cheryl Dixon [phone], 61 year old, female    Calls today:  Clinical Questions (NON-SICK CLINICAL QUESTIONS ONLY)    Specific nature of request Patient called to inform RN that a man named Teacher, adult education from [employer] would be calling to request a letter from provider stating that patient has COPD. Patient was there yesterday and did not request letter. Patient's phone was low on battery so call was short and unable to ask for more information.   Return phone number [phone]  Person calling on behalf of patient: Patient (self)    Patient's language of care: English    Patient does not need an interpreter.    Patient's PCP: [name], MD

## 2017-01-06 NOTE — Progress Notes (Signed)
Letter drafted for MD sign.   Will leave for faxing.   Lannie Fields, RN, 01/06/2017, 4:45 PM

## 2017-01-06 NOTE — Telephone Encounter (Signed)
-----   Message from Reymundo Poll sent at 01/06/2017 12:14 PM EDT -----  Regarding: Utility letter / needs MD signature  Contact: [phone]  Cheryl Dixon [id number], 61 year old, female    Calls today:  Letters  Utility    Has patient received a utility letter from [place] before? Yes   Name of person requesting utility letter [place]  Name of [place] or [employer] scientist  Account number [id number]  [place] holder name Jya Hughston  Fax number [phone]  Home addressed confirmed Yes  +++Please Note: All information listed above is needed for each utility request, even if requested before+++  Person calling on behalf of patient: Case Manager     Patient's language of care: English    Patient does not need an interpreter.    Patient's PCP: [name], MD

## 2017-01-06 NOTE — Telephone Encounter (Signed)
-----   Message from Rhae Lerner sent at 01/06/2017  3:46 PM EDT -----  Regarding: Copy of letter  Contact: [phone]  Cheryl Dixon [id number], 61 year old, female    Calls today:  Clinical Questions ([place])    Name of person calling Sterling Big  Specific nature of request copy of utility letter that provider will send,to be fax to him  Return phone number fax [phone]/phone [phone]  Person calling on behalf of patientR.A.F.T :     Patient's language of care: English    Patient does not need an interpreter.    Patient's PCP: [name], MD

## 2017-01-08 DIAGNOSIS — R5382 Chronic fatigue, unspecified: Secondary | ICD-10-CM | POA: Diagnosis not present

## 2017-01-08 DIAGNOSIS — D510 Vitamin B12 deficiency anemia due to intrinsic factor deficiency: Secondary | ICD-10-CM | POA: Diagnosis not present

## 2017-01-08 DIAGNOSIS — R799 Abnormal finding of blood chemistry, unspecified: Secondary | ICD-10-CM | POA: Diagnosis not present

## 2017-01-08 DIAGNOSIS — E039 Hypothyroidism, unspecified: Secondary | ICD-10-CM | POA: Diagnosis not present

## 2017-01-08 DIAGNOSIS — E559 Vitamin D deficiency, unspecified: Secondary | ICD-10-CM | POA: Diagnosis not present

## 2017-01-09 ENCOUNTER — Ambulatory Visit (HOSPITAL_BASED_OUTPATIENT_CLINIC_OR_DEPARTMENT_OTHER): Payer: Commercial Managed Care - HMO | Admitting: Podiatrist

## 2017-01-13 ENCOUNTER — Telehealth (HOSPITAL_BASED_OUTPATIENT_CLINIC_OR_DEPARTMENT_OTHER): Payer: Self-pay | Admitting: Registered Nurse

## 2017-01-13 ENCOUNTER — Encounter (HOSPITAL_BASED_OUTPATIENT_CLINIC_OR_DEPARTMENT_OTHER): Payer: Self-pay | Admitting: Physician Assistant

## 2017-01-13 NOTE — Telephone Encounter (Signed)
-----   Message from [place] sent at 01/13/2017  2:08 PM EDT -----  Regarding: [name]. to speak to advocate  Cheryl Dixon [phone], 61 year old, female    Calls today:  Clinical Questions (NON-SICK CLINICAL QUESTIONS ONLY)    Specific nature of request states her utility services were shut off and she has an advocate that would like to speak to pt's pcp. Pt wants nurse to call her back because she wants to give permission for advocate to speak to pcp's office. I advised she may need to sign release form. Pt requesting to speak to nurse.   Return phone number [phone]  Person calling on behalf of patient: Patient (self)    Patient's language of care: English    Patient's PCP: [name], MD

## 2017-01-13 NOTE — Progress Notes (Signed)
That is fine, ok to draft a letter stating she is being treated for depression. She should keep in mind though that I cannot force them to prevent shut off or take any specific action.

## 2017-01-13 NOTE — Progress Notes (Signed)
Tc to requesting updated letter for utility   States she's having a difficult time with [employer] (electricity company  Would like to include something along lines of:  Pt suffering from depression and currently being treated in addition to [place], RN, 01/13/2017, 6:11 PM

## 2017-01-14 ENCOUNTER — Encounter (HOSPITAL_BASED_OUTPATIENT_CLINIC_OR_DEPARTMENT_OTHER): Payer: Self-pay | Admitting: Physician Assistant

## 2017-01-14 NOTE — Progress Notes (Signed)
Letter drafted and given to PMR to fax

## 2017-01-26 ENCOUNTER — Other Ambulatory Visit (HOSPITAL_BASED_OUTPATIENT_CLINIC_OR_DEPARTMENT_OTHER): Payer: Self-pay | Admitting: Licensed Practical Nurse

## 2017-01-26 DIAGNOSIS — M25561 Pain in right knee: Secondary | ICD-10-CM

## 2017-01-26 DIAGNOSIS — M25562 Pain in left knee: Principal | ICD-10-CM

## 2017-01-27 ENCOUNTER — Ambulatory Visit (HOSPITAL_BASED_OUTPATIENT_CLINIC_OR_DEPARTMENT_OTHER): Payer: Commercial Managed Care - HMO | Admitting: Orthopaedic Surgery

## 2017-01-27 ENCOUNTER — Other Ambulatory Visit: Payer: Self-pay | Admitting: Rheumatology

## 2017-01-27 NOTE — Telephone Encounter (Signed)
Last Visit: 11/20/16 Next Visit: 02/20/17  Okay to refill per Dr. Estanislado Pandy

## 2017-01-28 ENCOUNTER — Other Ambulatory Visit (HOSPITAL_BASED_OUTPATIENT_CLINIC_OR_DEPARTMENT_OTHER): Payer: Self-pay | Admitting: Social Worker

## 2017-01-28 NOTE — Progress Notes (Signed)
Patient informed She has been added to a wait list for assignment. Patient requesting services [facility] or [facility].      Adult Outpatient Psychiatry & Addictions     Service Type: Psychopharmacology    History of disruptive behavior: No        No psychopharmacology services available at [facility].

## 2017-02-08 NOTE — Progress Notes (Signed)
Office Visit Note  Patient: Michelle Roberts             Date of Birth: Jul 22, 1955           MRN: [id number]             PCP: Antony Contras, MD Referring: Antony Contras, MD Visit Date: 02/20/2017 Occupation: @GUAROCC @    Subjective:  Pain in all the joints.   History of Present Illness: Michelle Roberts is a 61 y.o. female with history of sero positive rheumatoid arthritis. She's been off Orencia since November 2017 after she was diagnosed with left parotid gland cancer. She had resection of the parotid gland and the margins were clear. She did not need any additional treatment. She states recently she was found to have another not around her year and she had MRI for that. The results are pending at this point. She's been having increased joint pain and joint stiffness recently. She's been having pain in her bilateral hands, bilateral feet in bilateral knee joints. She denies any joint swelling. She also has generalized pain from fibromyalgia. Lower back pain flares off and on.  Activities of Daily Living:  Patient reports morning stiffness for12 hours.   Patient Reports nocturnal pain.  Difficulty dressing/grooming: Reports Difficulty climbing stairs: Denies Difficulty getting out of chair: Denies Difficulty using hands for taps, buttons, cutlery, and/or writing: Reports   Review of Systems  Constitutional: Positive for fatigue. Negative for night sweats, weight gain, weight loss and weakness.  HENT: Positive for mouth dryness. Negative for mouth sores, trouble swallowing, trouble swallowing and nose dryness.   Eyes: Positive for dryness. Negative for pain, redness and visual disturbance.  Respiratory: Negative.  Negative for cough, shortness of breath and difficulty breathing.   Cardiovascular: Negative for chest pain, palpitations, hypertension, irregular heartbeat and swelling in legs/feet.  Gastrointestinal: Negative.  Negative for blood in stool, constipation and diarrhea.    Endocrine: Negative for increased urination.  Genitourinary: Negative for vaginal dryness.  Musculoskeletal: Positive for arthralgias, joint pain, myalgias, morning stiffness and myalgias. Negative for joint swelling, muscle weakness and muscle tenderness.  Skin: Negative.  Negative for color change, rash, hair loss, skin tightness, ulcers and sensitivity to sunlight.  Allergic/Immunologic: Negative for susceptible to infections.  Neurological: Negative for dizziness, numbness, headaches, memory loss and night sweats.  Hematological: Negative for swollen glands.  Psychiatric/Behavioral: Positive for sleep disturbance. Negative for depressed mood. The patient is not nervous/anxious.     PMFS History:  Patient Active Problem List   Diagnosis Date Noted  . Cancer of parotid gland ([place]) 02/20/2017  . Fibromyalgia 08/12/2016  . High risk medication use 08/12/2016  . Primary osteoarthritis of both hands 08/12/2016  . Acute midline low back pain 08/12/2016  . DDD (degenerative disc disease), lumbar 08/12/2016  . History of hypertension 08/12/2016  . History of high cholesterol 08/12/2016  . Thyroid ca ([place]) 08/12/2016  . History of hypothyroidism 08/12/2016  . History of gastroesophageal reflux (GERD) 08/12/2016  . History of TMJ syndrome 08/12/2016  . Burning tongue syndrome 08/12/2016  . History of vitamin D deficiency 08/12/2016  . Other sleep apnea 08/12/2016  . Vitamin D deficiency 08/12/2016  . Rheumatoid arthritis with rheumatoid factor of multiple sites without organ or systems involvement ([place]) 12/31/2015    Past Medical History:  Diagnosis Date  . Anxiety   . Arthritis   . Depression   . Fibromyalgia   . GERD (gastroesophageal reflux disease)   . Hyperlipidemia   .  Hypertension   . Hypothyroidism    thyroidectomy  . Neoplasm of parotid gland   . Neuromuscular disorder ([place])   . PONV (postoperative nausea and vomiting)   . Sleep apnea     Family History  Problem  Relation Age of Onset  . Alzheimer's disease Mother   . Alzheimer's disease Sister    Past Surgical History:  Procedure Laterality Date  . ABDOMINAL HYSTERECTOMY    . CHOLECYSTECTOMY    . HAMMER TOE SURGERY    . PAROTIDECTOMY Left 04/08/2016   Procedure: LEFT SUPERFICIAL PAROTIDECTOMY WITH FACIAL NERVE DISECTION;  Surgeon: Rozetta Nunnery, MD;  Location: [facility];  Service: ENT;  Laterality: Left;  . THYROIDECTOMY     Social History   Social History Narrative  . No narrative on file     Objective: Vital Signs: BP (!) 157/97 (BP Location: Left Arm, Patient Position: Sitting, Cuff Size: Normal)   Pulse 97   Ht 4\' 10"  (1.473 m)   Wt 199 lb (90.3 kg)   BMI 41.59 kg/m    Physical Exam  Constitutional: She is oriented to person, place, and time. She appears well-developed and well-nourished.  HENT:  Head: Normocephalic and atraumatic.  Eyes: Conjunctivae and EOM are normal.  Neck: Normal range of motion.  Cardiovascular: Normal rate, regular rhythm, normal heart sounds and intact distal pulses.   Pulmonary/Chest: Effort normal and breath sounds normal.  Abdominal: Soft. Bowel sounds are normal.  Lymphadenopathy:    She has no cervical adenopathy.  Neurological: She is alert and oriented to person, place, and time.  Skin: Skin is warm and dry. Capillary refill takes less than 2 seconds.  Psychiatric: She has a normal mood and affect. Her behavior is normal.  Nursing note and vitals reviewed.    Musculoskeletal Exam: C-spine and thoracic lumbar spine good range of motion. Shoulder joints elbow joints wrist joints are good range of motion with some discomfort. She has good range of motion of bilateral wrist joints with no synovitis. No MCP swelling or synovitis was noted. She is tenderness across her PIP joints with some thickening. But no synovitis was noted. Hip joints knee joints ankles MTPs PIPs DIPs with good range of motion with no synovitis. She has  some tenderness across her MTPs.  CDAI Exam: CDAI Homunculus Exam:   Joint Counts:  CDAI Tender Joint count: 0 CDAI Swollen Joint count: 0  Global Assessments:  Patient Global Assessment: 8 Provider Global Assessment: 2  CDAI Calculated Score: 10    Investigation: No additional findings.Tb Gold: Negative in 12/2015 01/08/2017 hemoglobin A1c 6.0 uric acid 7.4, CMP normal, C-reactive protein 6.45, CBC normal Imaging: Ct Soft Tissue Neck W Contrast  Result Date: 02/19/2017 CLINICAL DATA:  Swollen lymph node at left ear for 4 months. History of left parotid mucoepidermoid resection with negative margins in 2017. EXAM: CT NECK WITH CONTRAST TECHNIQUE: Multidetector CT imaging of the neck was performed using the standard protocol following the bolus administration of intravenous contrast. Creatinine was obtained on site at [facility] at [address] Results: Creatinine 1 mg/dL. CONTRAST:  23mL ISOVUE-300 IOPAMIDOL (ISOVUE-300) INJECTION 61% COMPARISON:  None. FINDINGS: Pharynx and larynx: Normal. Salivary glands: Postoperative left parotid gland with superficial sheet like soft tissue density consistent with scarring. A small symmetric lymph node is present in the superficial gland near the palpable marker. Tissue below the marker measuring up to 18 mm has the appearance of normal parotid parenchyma, matching accessory tissue following the  parotid duct over the masseter bilaterally, but is asymmetric to the majority of the glands which are fatty atrophic. No bony erosions seen at the skullbase foramina. Thyroid: Surgically absent. Patient has history of thyroid carcinoma. Lymph nodes: Negative for adenopathy in the neck. No enlarged, calcified, or cystic nodes. Vascular: Negative other than atherosclerotic plaque at the carotid siphons. Limited intracranial: Negative Visualized orbits: Negative Mastoids and visualized paranasal sinuses: Clear Skeleton: Facet spurring and focal C5-6  disc degeneration. Mild C7-T1 anterolisthesis. Upper chest: Negative.  No apical nodularity. IMPRESSION: 1. Changes of left partial parotidectomy for mucoepidermoid carcinoma. 18 mm soft tissue density deep to the palpable marker has the appearance of normal parotid parenchyma but is asymmetric to the majority of the bilateral parotids which are fatty atrophic. Correlation with mass appearance on preoperative imaging may be useful. Depending on level of clinical concern a MRI or follow-up CT could be obtained. 2. Thyroidectomy for carcinoma. No thyroid bed nodularity or adenopathy. Electronically Signed   By: Monte Fantasia M.D.   On: 02/19/2017 08:43    Speciality Comments: No specialty comments available.    Procedures:  No procedures performed Allergies: Epinephrine   Assessment / Plan:     Visit Diagnoses: Rheumatoid arthritis involving multiple sites with positive rheumatoid factor ([place]): She was treated with IV Orencia in the past. She's been off Orencia since November 2017 due to parotid cancer. She has no synovitis on examination today. She continues to have arthralgias. I offered x-ray of her bilateral hands and feet but she declined. I had detailed discussion with the patient in case she has flare of her arthritis we may be able to get clearance from her ENT an oncologist and should be able to resume Orencia if needed.  High risk medication use - Off Orencia since November 2017  Primary osteoarthritis of both hands: Joint protection and muscle strengthening discussed.  DDD (degenerative disc disease), lumbar: She has some chronic pain in her lower back.  Fibromyalgia: She continues to have some generalized pain and discomfort from fibromyalgia.  History of vitamin D deficiency: She is on supplement.  Her other medical problems are listed as follows:  History of hypertension  History of hypercholesterolemia  History of hypothyroidism  History of gastroesophageal reflux  (GERD)  History of sleep apnea  History of thyroid cancer - 2006  Cancer of parotid gland ([place]) - 02/2016, left.     Orders: No orders of the defined types were placed in this encounter.  No orders of the defined types were placed in this encounter.    Follow-Up Instructions: Return in about 6 months (around 08/21/2017).   Bo Merino, MD  Note - This record has been created using Editor, commissioning.  Chart creation errors have been sought, but may not always  have been located. Such creation errors do not reflect on  the standard of medical care.

## 2017-02-12 ENCOUNTER — Encounter (HOSPITAL_BASED_OUTPATIENT_CLINIC_OR_DEPARTMENT_OTHER): Payer: Self-pay

## 2017-02-12 ENCOUNTER — Telehealth (HOSPITAL_BASED_OUTPATIENT_CLINIC_OR_DEPARTMENT_OTHER): Payer: Self-pay | Admitting: Registered Nurse

## 2017-02-12 ENCOUNTER — Other Ambulatory Visit: Payer: Self-pay | Admitting: Rheumatology

## 2017-02-12 DIAGNOSIS — R590 Localized enlarged lymph nodes: Secondary | ICD-10-CM | POA: Diagnosis not present

## 2017-02-12 NOTE — Progress Notes (Signed)
Reached pt, calling to inform MD she is now on Methadone 29mg  and tomorrow dose will be cut down to 27mg   Pt would like to start suboxone or vivitrol which ever MD recommends  Pt says she has been experiencing withdrawals 3-4 days after decrease or 1 week prior to next decrease.   States clonidine has worked great for withdrawal symptoms at last ER visti 03/19/16  Pls advise.

## 2017-02-12 NOTE — Telephone Encounter (Signed)
-----   Message from Stevenson Clinch sent at 02/12/2017  4:09 PM EDT -----  Regarding: Med question   Contact: [phone]  Cheryl Dixon [phone], 61 year old, female    Calls today:  Clinical Questions ([place])    Name of person calling pt   Specific nature of request Would like to speak to RN in regards to meds   Return phone number [phone]  Person calling on behalf of patient: Patient (self)  Patient's language of care: English    Patient does not need an interpreter.    Patient's PCP: [name], MD

## 2017-02-12 NOTE — Telephone Encounter (Signed)
Last Visit: 11/20/16 Next Visit: 02/20/17  Okay to refill per Dr. Estanislado Pandy

## 2017-02-13 ENCOUNTER — Other Ambulatory Visit: Payer: Self-pay | Admitting: Otolaryngology

## 2017-02-13 DIAGNOSIS — R599 Enlarged lymph nodes, unspecified: Secondary | ICD-10-CM

## 2017-02-13 NOTE — Progress Notes (Signed)
I left message for pt to call back:    Cheryl Dixon -- she is my patient for a long time, has been tapering methadone with the hope of getting on buprenorphine, and is now down to a dose where we can work on it.  Can you get her in for a visit on a Tuesday so we can start working on the transition?    She can ultimately got to [place] if she prefers, but the transition from methadone more challenging than other inductions, so it would be good for me to meet with her and lay out some options & expectations.

## 2017-02-16 ENCOUNTER — Telehealth (HOSPITAL_BASED_OUTPATIENT_CLINIC_OR_DEPARTMENT_OTHER): Payer: Self-pay | Admitting: Registered Nurse

## 2017-02-16 NOTE — Progress Notes (Signed)
Called Cheryl Dixon today to schedule an appointment to speak about next steps.  I was able to leave my call back information on her voicemail.  Will continue to follow.

## 2017-02-16 NOTE — Telephone Encounter (Signed)
-----   Message from [facility] sent at 02/16/2017 10:59 AM EDT -----  Regarding: Returning call -follow up  Contact: [place] [phone], 61 year old, female    Calls today:  Clinical Questions (NON-SICK CLINICAL QUESTIONS ONLY)      Specific nature of request Returning call - follow up  Return phone number [phone]  Person calling on behalf of patient: Patient (self)    Patient's language of care: English    Patient does not need an interpreter.    Patient's PCP: [name], MD

## 2017-02-17 ENCOUNTER — Ambulatory Visit (HOSPITAL_BASED_OUTPATIENT_CLINIC_OR_DEPARTMENT_OTHER): Payer: Commercial Managed Care - HMO | Admitting: Internal Medicine

## 2017-02-17 ENCOUNTER — Encounter (HOSPITAL_BASED_OUTPATIENT_CLINIC_OR_DEPARTMENT_OTHER): Payer: Self-pay | Admitting: Internal Medicine

## 2017-02-17 ENCOUNTER — Telehealth (HOSPITAL_BASED_OUTPATIENT_CLINIC_OR_DEPARTMENT_OTHER): Payer: Self-pay | Admitting: Internal Medicine

## 2017-02-17 VITALS — BP 84/62 | HR 66 | Temp 98.0°F | Wt 212.7 lb

## 2017-02-17 DIAGNOSIS — F1120 Opioid dependence, uncomplicated: Secondary | ICD-10-CM

## 2017-02-17 DIAGNOSIS — F1123 Opioid dependence with withdrawal: Secondary | ICD-10-CM

## 2017-02-17 DIAGNOSIS — F1193 Opioid use, unspecified with withdrawal: Secondary | ICD-10-CM

## 2017-02-17 MED ORDER — NAPROXEN 500 MG PO TABS
500.0000 mg | ORAL_TABLET | Freq: Two times a day (BID) | ORAL | 0 refills | Status: DC
Start: 2017-02-17 — End: 2017-03-04

## 2017-02-17 MED ORDER — TRAZODONE HCL 50 MG PO TABS: 50 mg | tablet | Freq: Every evening | ORAL | 0 refills | 0 days | Status: DC

## 2017-02-17 MED ORDER — TRAZODONE HCL 50 MG PO TABS
50.0000 mg | ORAL_TABLET | Freq: Every evening | ORAL | 0 refills | Status: DC
Start: 2017-02-17 — End: 2017-02-24

## 2017-02-17 MED ORDER — NAPROXEN 500 MG PO TABS: 500 mg | tablet | Freq: Two times a day (BID) | ORAL | 0 refills | 0 days | Status: AC

## 2017-02-17 NOTE — Progress Notes (Signed)
Still a little chills and a little cramps, mostly feeling good  Took 1/2 in morning and 1/2 in the evening  Will try to stick with one per day for now, but there is room to go up if needed.  Will check in next Tuesday.

## 2017-02-17 NOTE — Progress Notes (Signed)
Subjective      Cheryl Dixon is a 61 year old female seen for opiate dependence:    Opioid dependence  Currently on methadone - last dose 29 mg on Friday  Has tapered from 58 mg, was trying to taper for some time but was getting withdrawal symptoms every time she went down 2 mg  She finally got sick of it and after we scheduled this appointment last Friday she stopped going to the clinic in hopes of getting started on suboxone today.    Legs hurt, arms hurt  Every time she went down 2 mg she got sick  No vomiting or diarrhea  No BM in 5 days  No stomach cramps  Feeling a little achy  No tremor  Feeling restless    Has been taking clonidine 0.1 mg every night and then took 0.2 mg last night  Also took 300 mg gabapentin and smoking MJ    Has been going to [facility] for a number of years  Counselor is Richardson Landry  Never had take-homes because off MJ and occasional clonazepam use.  Was seen in ED about a year ago complaining of withdrawal symptoms after cutting down 2 mg to 38 mg two weeks prior      Patient Active Problem List:     Opioid dependence on agonist therapy (HCC)     Tobacco use disorder     Fibrocystic breast     Elevated BP     Knee pain     Chronic low back pain     OA (osteoarthritis) of knee     H. pylori infection     Major depressive disorder, recurrent episode, severe (HCC)     Occult blood in stools     Prediabetes     Epigastric pain     COPD with exacerbation (HCC)     Chronic obstructive pulmonary disease (HCC)     Left foot pain     Adenomatous polyp of colon    Family History   Problem Relation Age of Onset    Heart Father         CAD, died age 46    Heart Mother         CAD    Heart Brother         CAD, died age 73    Pulmonary Father         COPD    Cancer - Breast Maternal Aunt         age 64    Cancer - Breast Maternal Aunt         age 70       Current Outpatient Medications:  docusate sodium (COLACE) 100 MG capsule Take 1 capsule by mouth 2 (two) times daily Disp: 60 capsule Rfl: 11   buPROPion  (WELLBUTRIN SR) 150 MG 12 hr tablet Take 1 tablet by mouth 2 (two) times daily Disp: 60 tablet Rfl: 2   ipratropium-albuterol (DUO-NEB) 0.5-2.5 (3) MG/3ML SOLN Inhalation Solution Take 3 mLs by nebulization 4 (four) times daily. Disp: 180 mL Rfl: 0   methadone (DOLOPHINE) 10 mg/mL solution Take 55 mg by mouth daily. Disp:  Rfl:      No current facility-administered medications for this visit.   Review of Patient's Allergies indicates:   Darvon                     Meperidine hcl  Comment:Nausea/vomit   Paxil [paroxetine]      Rash   Zoloft [sertraline *    Rash              Objective     BP (!) 76/53  Pulse 66  Temp 98 F (36.7 C)  Wt 96.5 kg (212 lb 11.2 oz)  LMP 11/20/1991  SpO2 98%  BMI 34.33 kg/m2    Most Recent BP Reading(s)  02/17/17 : (!) 84/62  01/05/17 : 136/80  11/27/16 : 134/81    Physical Exam   Constitutional:   Perspiring, eyes moist, no nasal discharge   HENT:   Head: Normocephalic and atraumatic.   Pupils 2 mm   Eyes: Conjunctivae are normal.   Cardiovascular: Normal rate and regular rhythm.   No murmur heard.  Pulmonary/Chest: Effort normal.   Abdominal: Soft. There is no tenderness.   Neurological: She is alert.   No tremor   Skin:   No gooseflesh   Psychiatric: She has a normal mood and affect.       COWS score 7:  (0/2/1/0/1/1/1/0/0/2/0)       Plan   Opioid dependence  Mild withdrawal after not having methadone for four days  Despite my prior instruction she walked off her clinic instead of arranging for a smooth transition.  Will discuss with methadone clinic tomorrow to confirm history  Given low COWS score she is likely to have precipitated withdrawal if started on buprenorphine now.  Hypotension likely due to overuse of clonidine  We discussed the risks of transitioning to buprenorphine from this dose of methadone - she is likely to have some withdrawal symptoms and it is likely to be a more difficult transition than if she had started from a lower dose.  She would nevertheless  like to move ahead  - UDS  - follow up in two days to reassess withdrawal symptoms  - strict instructions not to take any more clonidine from the street which could cause dangerous drop in blood pressure  - naproxen for aches  - trazodone for sleep  - she is not having significant GI symptoms     We discussed the patients current medications. The patient expressed understanding and no barriers to adherence were identified.  1. The patient indicates understanding of these issues and agrees with the plan.  Brief care plan is updated and reviewed with the patient.   2. The patient is given an After Visit Summary sheet that lists all medications with directions, allergies, orders placed during this encounter, and follow-up instructions.   3. I reviewed the patient's medical information and medical history   4. I reconciled the patient's medication list and prepared and supplied needed refills.   5. I have reviewed the past medical, family, and social history sections including the medications and allergies.    [name], MD      No orders of the defined types were placed in this encounter.

## 2017-02-18 LAB — OXYCODONE SCREEN URINE
OXYCOD SCRN URINE: NEGATIVE
OXYCOD UR SPEC GRAV: >1.03 (ref 1.003–1.035)
OXYCOD URINE CREAT: 568 md/dL
OXYCOD URINE PH: 5.5 (ref 4.5–8.5)

## 2017-02-18 LAB — BENZODIAZEPINES URINE: BENZODIAZEPINES URINE: POSITIVE — AB

## 2017-02-18 LAB — FENTANYL URINE: FENTANYL URINE: NEGATIVE

## 2017-02-18 LAB — METHADONE URINE: METHADONE URINE: POSITIVE — AB

## 2017-02-18 LAB — BUPRENORPHINE SCREEN URINE: BUPRENORPHINE SCREEN URINE: POSITIVE

## 2017-02-18 LAB — CANNABINOIDS URINE: CANNABINOIDS URINE: POSITIVE — AB

## 2017-02-18 LAB — OPIATES URINE: OPIATES URINE: NEGATIVE

## 2017-02-18 LAB — AMPHETAMINES URINE: AMPHETAMINES URINE: NEGATIVE

## 2017-02-18 LAB — COCAINE METABOLITES URINE: COCAINE METABOLITES URINE: NEGATIVE

## 2017-02-18 LAB — ETHANOL URINE: ETHANOL URINE: NEGATIVE

## 2017-02-18 NOTE — Progress Notes (Signed)
Expected result

## 2017-02-19 ENCOUNTER — Ambulatory Visit (HOSPITAL_BASED_OUTPATIENT_CLINIC_OR_DEPARTMENT_OTHER): Payer: Self-pay | Admitting: Registered Nurse

## 2017-02-19 ENCOUNTER — Ambulatory Visit (HOSPITAL_BASED_OUTPATIENT_CLINIC_OR_DEPARTMENT_OTHER): Payer: Commercial Managed Care - HMO | Admitting: Internal Medicine

## 2017-02-19 ENCOUNTER — Encounter (HOSPITAL_BASED_OUTPATIENT_CLINIC_OR_DEPARTMENT_OTHER): Payer: Self-pay | Admitting: Internal Medicine

## 2017-02-19 ENCOUNTER — Ambulatory Visit
Admission: RE | Admit: 2017-02-19 | Discharge: 2017-02-19 | Disposition: A | Discharge status: Home / Self Care | Payer: 59 | Source: Ambulatory Visit | Attending: Otolaryngology | Admitting: Otolaryngology

## 2017-02-19 VITALS — BP 138/90 | HR 77 | Temp 98.2°F | Wt 210.0 lb

## 2017-02-19 DIAGNOSIS — Z23 Encounter for immunization: Secondary | ICD-10-CM

## 2017-02-19 DIAGNOSIS — R599 Enlarged lymph nodes, unspecified: Secondary | ICD-10-CM

## 2017-02-19 DIAGNOSIS — R221 Localized swelling, mass and lump, neck: Secondary | ICD-10-CM | POA: Diagnosis not present

## 2017-02-19 DIAGNOSIS — E7800 Pure hypercholesterolemia, unspecified: Secondary | ICD-10-CM | POA: Diagnosis not present

## 2017-02-19 DIAGNOSIS — E890 Postprocedural hypothyroidism: Secondary | ICD-10-CM | POA: Diagnosis not present

## 2017-02-19 DIAGNOSIS — E1165 Type 2 diabetes mellitus with hyperglycemia: Secondary | ICD-10-CM | POA: Diagnosis not present

## 2017-02-19 MED ORDER — BUPRENORPHINE HCL-NALOXONE HCL 8-2 MG SL FILM
ORAL_FILM | SUBLINGUAL | 0 refills | Status: DC
Start: 2017-02-19 — End: 2017-02-24

## 2017-02-19 MED ORDER — BUPRENORPHINE HCL-NALOXONE HCL 8-2 MG SL FILM: Film | SUBLINGUAL | 0 refills | 0 days | Status: DC

## 2017-02-19 MED ORDER — IOPAMIDOL (ISOVUE-300) INJECTION 61%
75.0000 mL | Freq: Once | INTRAVENOUS | Status: AC | PRN
  Administered 2017-02-19: 75 mL via INTRAVENOUS
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
## 2017-02-19 NOTE — Patient Instructions (Signed)
.  subox

## 2017-02-19 NOTE — Progress Notes (Signed)
Influenza Vaccine Procedure      1. Has the patient received the information for the influenza vaccine? Yes    2. Does the patient have any of the following contraindications?  Allergy to eggs? No  Allergic reaction to previous influenza vaccines? No  Any other problems to previous influenza vaccines? No  Paralyzed by Guillain-Barre syndrome?  No  Current moderate or severe illness? No  Allergy to contact lens solution? No    3. The vaccine has been administered in the usual fashion.     Immunization information reviewed. Current VIS reviewed and given to patient/ guardian. Verbal assent obtained from patient/ guardian.  See immunization/Injection module or chart review for date of publication and additional information. Verbal assent obtained from patient/guardian. Comfort measures for possible side effects reviewed.

## 2017-02-19 NOTE — Telephone Encounter (Signed)
Tc to calling b/c she is concerned that PCP will not prescribe suboxone.   Discussed importance of keeping appt with PCP best to come in have thorough discussion with MD re: methadone taper  Pt agrees to come in.   Lannie Fields, RN, 02/19/2017, 3:13 PM

## 2017-02-19 NOTE — Telephone Encounter (Signed)
Regarding: Blood pressure concerns/ Todays appointment   ----- Message from [facility] sent at 02/19/2017  2:47 PM EDT -----  Cheryl Dixon [id number], 61 year old, female    Calls today:  Clinical Questions ([place])      Specific nature of request patient is requesting call back from RN - blood pressure concerns and questions re today's appointment Thank you   Return phone number [phone]  Person calling on behalf of patient: Patient (self)      Patient's language of care: English    Patient does not need an interpreter.    Patient's PCP: [name], MD

## 2017-02-20 ENCOUNTER — Encounter: Payer: Self-pay | Admitting: Rheumatology

## 2017-02-20 ENCOUNTER — Ambulatory Visit (INDEPENDENT_AMBULATORY_CARE_PROVIDER_SITE_OTHER): Payer: 59 | Admitting: Rheumatology

## 2017-02-20 VITALS — BP 157/97 | HR 97 | Ht <= 58 in | Wt 199.0 lb

## 2017-02-20 DIAGNOSIS — M51369 Other intervertebral disc degeneration, lumbar region without mention of lumbar back pain or lower extremity pain: Secondary | ICD-10-CM

## 2017-02-20 DIAGNOSIS — Z8639 Personal history of other endocrine, nutritional and metabolic disease: Secondary | ICD-10-CM | POA: Diagnosis not present

## 2017-02-20 DIAGNOSIS — Z85850 Personal history of malignant neoplasm of thyroid: Secondary | ICD-10-CM

## 2017-02-20 DIAGNOSIS — C07 Malignant neoplasm of parotid gland: Secondary | ICD-10-CM

## 2017-02-20 DIAGNOSIS — M5136 Other intervertebral disc degeneration, lumbar region: Secondary | ICD-10-CM | POA: Diagnosis not present

## 2017-02-20 DIAGNOSIS — M19041 Primary osteoarthritis, right hand: Secondary | ICD-10-CM

## 2017-02-20 DIAGNOSIS — Z79899 Other long term (current) drug therapy: Secondary | ICD-10-CM

## 2017-02-20 DIAGNOSIS — Z8719 Personal history of other diseases of the digestive system: Secondary | ICD-10-CM | POA: Diagnosis not present

## 2017-02-20 DIAGNOSIS — M0579 Rheumatoid arthritis with rheumatoid factor of multiple sites without organ or systems involvement: Secondary | ICD-10-CM | POA: Diagnosis not present

## 2017-02-20 DIAGNOSIS — M19042 Primary osteoarthritis, left hand: Secondary | ICD-10-CM

## 2017-02-20 DIAGNOSIS — M797 Fibromyalgia: Secondary | ICD-10-CM | POA: Diagnosis not present

## 2017-02-20 DIAGNOSIS — Z8679 Personal history of other diseases of the circulatory system: Secondary | ICD-10-CM | POA: Diagnosis not present

## 2017-02-20 DIAGNOSIS — Z8669 Personal history of other diseases of the nervous system and sense organs: Secondary | ICD-10-CM | POA: Diagnosis not present

## 2017-02-20 LAB — COCAINE METABOLITES URINE: COCAINE METABOLITES URINE: NEGATIVE

## 2017-02-20 LAB — OXYCODONE SCREEN URINE
OXYCOD SCRN URINE: NEGATIVE
OXYCOD UR SPEC GRAV: 1.02 (ref 1.003–1.035)
OXYCOD URINE CREAT: 78 md/dL
OXYCOD URINE PH: 6 (ref 4.5–8.5)

## 2017-02-20 LAB — FENTANYL URINE: FENTANYL URINE: NEGATIVE

## 2017-02-20 LAB — METHADONE URINE: METHADONE URINE: POSITIVE — AB

## 2017-02-20 LAB — ETHANOL URINE: ETHANOL URINE: NEGATIVE

## 2017-02-20 LAB — BENZODIAZEPINES URINE: BENZODIAZEPINES URINE: POSITIVE — AB

## 2017-02-20 LAB — CANNABINOIDS URINE: CANNABINOIDS URINE: POSITIVE — AB

## 2017-02-20 LAB — BUPRENORPHINE SCREEN URINE: BUPRENORPHINE SCREEN URINE: POSITIVE

## 2017-02-20 LAB — OPIATES URINE: OPIATES URINE: NEGATIVE

## 2017-02-20 LAB — AMPHETAMINES URINE: AMPHETAMINES URINE: NEGATIVE

## 2017-02-20 NOTE — Progress Notes (Signed)
Expected result, pt admitted MJ and BDZ use

## 2017-02-23 ENCOUNTER — Telehealth (HOSPITAL_BASED_OUTPATIENT_CLINIC_OR_DEPARTMENT_OTHER): Payer: Self-pay | Admitting: Registered Nurse

## 2017-02-23 NOTE — Progress Notes (Signed)
Called and spoke with Cheryl Dixon this morning.  States she is coming along well, is in good spirits, and has had no cravings with current dose.  She is going to come into the clinic on Tuesday October 30th at 1pm to see me and at 1:30 to follow up with Dr. Lisa Roca.  She is also interested in attending group.  Will continue to follow.

## 2017-02-24 ENCOUNTER — Ambulatory Visit (HOSPITAL_BASED_OUTPATIENT_CLINIC_OR_DEPARTMENT_OTHER): Payer: Commercial Managed Care - HMO | Admitting: Registered Nurse

## 2017-02-24 ENCOUNTER — Ambulatory Visit (HOSPITAL_BASED_OUTPATIENT_CLINIC_OR_DEPARTMENT_OTHER): Payer: Commercial Managed Care - HMO | Admitting: Internal Medicine

## 2017-02-24 ENCOUNTER — Encounter (HOSPITAL_BASED_OUTPATIENT_CLINIC_OR_DEPARTMENT_OTHER): Payer: Self-pay | Admitting: Internal Medicine

## 2017-02-24 ENCOUNTER — Encounter (HOSPITAL_BASED_OUTPATIENT_CLINIC_OR_DEPARTMENT_OTHER): Payer: Self-pay | Admitting: Registered Nurse

## 2017-02-24 VITALS — BP 138/96 | HR 84

## 2017-02-24 DIAGNOSIS — Z23 Encounter for immunization: Secondary | ICD-10-CM

## 2017-02-24 DIAGNOSIS — F1120 Opioid dependence, uncomplicated: Secondary | ICD-10-CM

## 2017-02-24 DIAGNOSIS — F17200 Nicotine dependence, unspecified, uncomplicated: Secondary | ICD-10-CM

## 2017-02-24 DIAGNOSIS — F1111 Opioid abuse, in remission: Secondary | ICD-10-CM

## 2017-02-24 DIAGNOSIS — K1121 Acute sialoadenitis: Secondary | ICD-10-CM | POA: Diagnosis not present

## 2017-02-24 MED ORDER — BUPRENORPHINE HCL-NALOXONE HCL 8-2 MG SL FILM: 2 | Film | Freq: Every day | SUBLINGUAL | 0 refills | 0 days | Status: DC

## 2017-02-24 MED ORDER — NICOTINE 21 MG/24HR TD PT24: 1 | patch | Freq: Every day | TRANSDERMAL | 0 refills | 0 days | Status: AC

## 2017-02-24 MED ORDER — UMECLIDINIUM BROMIDE 62.5 MCG/INH IN AEPB
1.0000 | INHALATION_SPRAY | Freq: Every day | RESPIRATORY_TRACT | 2 refills | Status: DC
Start: 2017-02-24 — End: 2017-06-01

## 2017-02-24 MED ORDER — ALBUTEROL SULFATE HFA 108 (90 BASE) MCG/ACT IN AERS
2.0000 | INHALATION_SPRAY | Freq: Four times a day (QID) | RESPIRATORY_TRACT | 2 refills | Status: DC | PRN
Start: 2017-02-24 — End: 2018-09-28

## 2017-02-24 MED ORDER — NICOTINE 21 MG/24HR TD PT24
1.00 | MEDICATED_PATCH | Freq: Every day | TRANSDERMAL | 0 refills | Status: AC
Start: 2017-02-24 — End: 2017-03-26

## 2017-02-24 MED ORDER — BUPRENORPHINE HCL-NALOXONE HCL 8-2 MG SL FILM
2.0000 | ORAL_FILM | Freq: Every day | SUBLINGUAL | 0 refills | Status: DC
Start: 2017-02-24 — End: 2017-03-03

## 2017-02-24 MED ORDER — ALBUTEROL SULFATE HFA 108 (90 BASE) MCG/ACT IN AERS: 2 | Inhaler | Freq: Four times a day (QID) | RESPIRATORY_TRACT | 2 refills | 0 days | Status: AC | PRN

## 2017-02-24 MED ORDER — UMECLIDINIUM BROMIDE 62.5 MCG/INH IN AEPB: 1 | Inhaler | Freq: Every day | RESPIRATORY_TRACT | 2 refills | 0 days | Status: DC

## 2017-02-24 NOTE — Progress Notes (Signed)
Appears to have been opened in error

## 2017-02-24 NOTE — Addendum Note (Signed)
Addended by: Chang Tiggs on: 02/24/2017 08:21 PM     Modules accepted: Orders

## 2017-02-24 NOTE — Progress Notes (Addendum)
Cheryl Dixon is a 61 year old female seen in follow up for opioid dependence:    Buprenorphine dose: just increased to 16 mg yesterday  Splitting dose throughout the day  Response, adequacy of dose: good - see below  Relapses/close calls: clonazepam, MJ, quetiapine - as below  Trigger: weaning down as she was taking more of that previously    Social history/events:  No diarrhea  No vomiting  Epigastric pain has resolved  Has been taking colace which seems to be helping  Taking miralax  Took 1/4 of 1 mg of clonazepam each day  Getting a little bit of chills and crawling sensation  Mood is better  Not feeling depressed  Continues on bupropion for 10 days  Took 1/2 of quetiapine for sleep    Smoking   Has cut down to 1-2 cigarettes per day  Sometimes stops for a couple of days    Present Medications:    Current Outpatient Medications on File Prior to Visit:  buprenorphine-naloxone (SUBOXONE) 8-2 MG FILM Take as directed, starting with one-half film.  No more than one and one-half films per day Disp: 12 Film Rfl: 0   naproxen (NAPROSYN) 500 MG tablet Take 1 tablet by mouth 2 (two) times daily with meals for 15 days Disp: 30 tablet Rfl: 0   traZODone (DESYREL) 50 MG tablet Take 1 tablet by mouth nightly for 7 days Disp: 7 tablet Rfl: 0   docusate sodium (COLACE) 100 MG capsule Take 1 capsule by mouth 2 (two) times daily Disp: 60 capsule Rfl: 11   buPROPion (WELLBUTRIN SR) 150 MG 12 hr tablet Take 1 tablet by mouth 2 (two) times daily Disp: 60 tablet Rfl: 2   ipratropium-albuterol (DUO-NEB) 0.5-2.5 (3) MG/3ML SOLN Inhalation Solution Take 3 mLs by nebulization 4 (four) times daily. Disp: 180 mL Rfl: 0     No current facility-administered medications on file prior to visit.     Review of Systems:  Neurological exam: negative  PHQ-9 TOTAL SCORE 01/05/2017 11/27/2016 08/27/2016   Doc FlowSheet Total Score - - -   Doc FlowSheet Total Score 22 18 22            PHYSICAL EXAMINATION:  General appearance - healthy female in no  distress  Eyes - pupils 3 mm  Regular rate and rhythm  Lungs clear  Skin - warm and dry  Neuro - nonfocal   Affect - normal    PMP reviewed.  No unauthorized prescriptions since last refill.    Assessment:  Opioid Dependence  Comment: doing well with transition from methadone to buprenorphine. She has had some late symptoms of withdrawal in the past so we will still need to be watchful for this in future.  Discussed risk of taking nonprescribed medications for symptom management  Plan:  Increase buprenorphine to 16 mg.  follow up weekly.  Patient is counseled regarding relapse prevention, involvement in recovery groups, potential side effects of buprenorphine, and eventual taper of medication.    Next visit  Elevated blood pressure  - plan to recheck at next visit, start medication if still elevated  (consider toprol)    COPD  Start incruse and albuterol today  - consider referral for PFTs  We discussed the patients current medications. The patient expressed understanding and no barriers to adherence were identified.  1. The patient indicates understanding of these issues and agrees with the plan.  Brief care plan is updated and reviewed with the patient.   2. The patient  is given an After Visit Summary sheet that lists all medications with directions, allergies, orders placed during this encounter, and follow-up instructions.   3. I reviewed the patient's medical information and medical history   4. I reconciled the patient's medication list and prepared and supplied needed refills.   5. I have reviewed the past medical, family, and social history sections including the medications and allergies.    [name], MD

## 2017-02-24 NOTE — Progress Notes (Signed)
Expand All Collapse All     Why Now: Pt here for follow up and intake for suboxone.  Recently left her methadone clinic.      Substance SEG:BTDVVOH substance of abuse was heroin.    Etoh: Pt began drinking at age 61 and still will have a drink on occasion.    Opiates Age:  First prescribed opiate use was at age 40.  Began using heroin at age 48.    Benzos: Pt states she first started using at age 70.  Still uses a very small amount daily.    :Has had 1 detox in her life.      Was clean: 13 years while on a methadone program.       OTC: Denies  Stimulants: Denies any use of stimulants.  Hallucinogens: Denies    Marijuana: First used at age 29 and currently smokes.    Cocaine: Denies    Inhalants:  Denies  Street Suboxone:  Denies     Street Methadone: Denies    Smoking:  Pt currently smokes approx 3 cig. daily  Has been smoking since age 35      Other Addictive Behaviors:Pt denies any other addictive behaviors    Gambling:  NA    Shopping:NA    Eating Disorders: NA    Self Mutilation: NA    Detoxes: Pt states she has been in one detox for opiate abuse  Residential: NA  Outpatient: Was in a methadone program for 13 years    Methadone: See above    12 Step Programs: NA and AA : No    Longest Period of Sobriety: 13 years    Living Situation: Pt lives in an apartment alone currently        Marital Status: Married but seperated    Children: 3 adult children    Level of Education: High school    Employment: Does not currently work    Medical Issues: See pmh      PCP: Dr. Lisa Roca    Allergies: Multiple.  See chart    Pain Assessment: Currently denies pain.  States she has generalized discomfort usually every day.      Last Physical: See chart    Mental Health Issues: NA    Current Therapist: NA      Suicidality:NA    Overdoses: life time- 0    Current Medications: See current meds in chart        Pt Teaching:    Treatment contract explained and signed as well as required OBOT forms. Copy of contract given to  pt.  Discussed suboxone and written information given.  Discussed potential hazards of combining suboxone with illicit substances especially non-prescribed benzodiazepines.  Explained that if prescribed opiates it should not be taken together with suboxone. Explain that suboxone should not be taken 12-24 hours prior to any surgeries, medical or dental procedures where opiates will be prescribed.  Medication safety discussed (store in a safe place away from children)  Explained that [facility] program is abstinence based.        Plan:    1. Groups every Tuesday  2. Weekly rx until further notice  3. utox weekly   4. Meds weekly                                            Expand All Collapse All

## 2017-02-25 LAB — OPIATES URINE: OPIATES URINE: NEGATIVE

## 2017-02-25 LAB — OXYCODONE SCREEN URINE
OXYCOD SCRN URINE: NEGATIVE
OXYCOD UR SPEC GRAV: 1.015 (ref 1.003–1.035)
OXYCOD URINE CREAT: 79 md/dL
OXYCOD URINE PH: 5.5 (ref 4.5–8.5)

## 2017-02-25 LAB — METHADONE URINE: METHADONE URINE: NEGATIVE

## 2017-02-25 LAB — BENZODIAZEPINES URINE: BENZODIAZEPINES URINE: NEGATIVE

## 2017-02-25 LAB — ETHANOL URINE: ETHANOL URINE: NEGATIVE

## 2017-02-25 LAB — BUPRENORPHINE SCREEN URINE: BUPRENORPHINE SCREEN URINE: POSITIVE

## 2017-02-25 LAB — AMPHETAMINES URINE: AMPHETAMINES URINE: NEGATIVE

## 2017-02-25 LAB — FENTANYL URINE: FENTANYL URINE: NEGATIVE

## 2017-02-25 LAB — CANNABINOIDS URINE: CANNABINOIDS URINE: NEGATIVE

## 2017-02-25 LAB — COCAINE METABOLITES URINE: COCAINE METABOLITES URINE: NEGATIVE

## 2017-02-26 ENCOUNTER — Other Ambulatory Visit (HOSPITAL_BASED_OUTPATIENT_CLINIC_OR_DEPARTMENT_OTHER): Payer: Self-pay | Admitting: Social Worker

## 2017-02-26 DIAGNOSIS — E559 Vitamin D deficiency, unspecified: Secondary | ICD-10-CM | POA: Diagnosis not present

## 2017-02-26 DIAGNOSIS — C73 Malignant neoplasm of thyroid gland: Secondary | ICD-10-CM | POA: Diagnosis not present

## 2017-02-26 DIAGNOSIS — E890 Postprocedural hypothyroidism: Secondary | ICD-10-CM | POA: Diagnosis not present

## 2017-02-26 NOTE — Progress Notes (Signed)
Thank you for your referral, patient scheduled psychopharmacology evaluation 03/02/17 11:00am with Dan Maker at [facility].

## 2017-02-27 ENCOUNTER — Ambulatory Visit (HOSPITAL_BASED_OUTPATIENT_CLINIC_OR_DEPARTMENT_OTHER): Payer: Commercial Managed Care - HMO | Admitting: Physician Assistant

## 2017-03-02 ENCOUNTER — Ambulatory Visit (HOSPITAL_BASED_OUTPATIENT_CLINIC_OR_DEPARTMENT_OTHER): Payer: Commercial Managed Care - HMO | Admitting: General Psych-OPD (PRV Practice 10)

## 2017-03-03 ENCOUNTER — Ambulatory Visit (HOSPITAL_BASED_OUTPATIENT_CLINIC_OR_DEPARTMENT_OTHER): Payer: Commercial Managed Care - HMO | Admitting: Internal Medicine

## 2017-03-03 ENCOUNTER — Other Ambulatory Visit (HOSPITAL_BASED_OUTPATIENT_CLINIC_OR_DEPARTMENT_OTHER): Payer: Self-pay

## 2017-03-03 ENCOUNTER — Other Ambulatory Visit (HOSPITAL_BASED_OUTPATIENT_CLINIC_OR_DEPARTMENT_OTHER): Payer: Self-pay | Admitting: Internal Medicine

## 2017-03-03 DIAGNOSIS — Z23 Encounter for immunization: Secondary | ICD-10-CM

## 2017-03-03 DIAGNOSIS — F1120 Opioid dependence, uncomplicated: Secondary | ICD-10-CM

## 2017-03-03 MED ORDER — BUPRENORPHINE HCL-NALOXONE HCL 8-2 MG SL FILM
2.0000 | ORAL_FILM | Freq: Every day | SUBLINGUAL | 0 refills | Status: DC
Start: 2017-03-03 — End: 2017-03-03

## 2017-03-03 MED ORDER — BUPRENORPHINE HCL-NALOXONE HCL 8-2 MG SL FILM
2.0000 | ORAL_FILM | Freq: Every day | SUBLINGUAL | 0 refills | Status: DC
Start: 2017-03-03 — End: 2017-03-10

## 2017-03-03 MED ORDER — BUPRENORPHINE HCL-NALOXONE HCL 8-2 MG SL FILM: 2 | Film | Freq: Every day | SUBLINGUAL | 0 refills | 0 days | Status: DC

## 2017-03-03 NOTE — Addendum Note (Signed)
Addended by: Daliana Leverett on: 03/03/2017 01:47 PM     Modules accepted: Orders

## 2017-03-03 NOTE — Progress Notes (Signed)
Cheryl Dixon is a 61 year old female seen in follow up for opioid dependence:    Buprenorphine dose: 16 mg  Response, adequacy of dose: good  Relapses/close calls: none  Trigger: n/a  Meetings: no    Social history/events:  Feeling more alert and alive since switching from methadone    Present Medications:    Current Outpatient Medications on File Prior to Visit:  nicotine (NICODERM CQ) 21 MG/24HR Place 1 patch onto the skin daily Disp: 30 patch Rfl: 0   umeclidinium (INCRUSE ELLIPTA) 62.5 MCG/INH inhaler Inhale 1 puff into the lungs daily Disp: 1 Inhaler Rfl: 2   albuterol (PROVENTIL HFA,VENTOLIN HFA, PROAIR HFA) 108 (90 Base) MCG/ACT inhaler Inhale 2 puffs into the lungs every 6 (six) hours as needed for Wheezing or Shortness of breath Disp: 1 Inhaler Rfl: 2   [DISCONTINUED] buprenorphine-naloxone (SUBOXONE) 8-2 MG FILM Place 2 Film under the tongue daily Max Daily Amount: 2 Film for 7 days Disp: 14 Film Rfl: 0   naproxen (NAPROSYN) 500 MG tablet Take 1 tablet by mouth 2 (two) times daily with meals for 15 days Disp: 30 tablet Rfl: 0   docusate sodium (COLACE) 100 MG capsule Take 1 capsule by mouth 2 (two) times daily Disp: 60 capsule Rfl: 11   buPROPion (WELLBUTRIN SR) 150 MG 12 hr tablet Take 1 tablet by mouth 2 (two) times daily Disp: 60 tablet Rfl: 2   ipratropium-albuterol (DUO-NEB) 0.5-2.5 (3) MG/3ML SOLN Inhalation Solution Take 3 mLs by nebulization 4 (four) times daily. Disp: 180 mL Rfl: 0     No current facility-administered medications on file prior to visit.     Review of Systems:  Neurological exam: negative  PHQ-9 TOTAL SCORE 03/03/2017 01/05/2017 11/27/2016   Doc FlowSheet Total Score - - -   Doc FlowSheet Total Score 4 22 18            PHYSICAL EXAMINATION:  General appearance - healthy female in no distress  Eyes - pupils 3 mm  Skin - warm and dry  Neuro - nonfocal  Affect - normal    PMP reviewed.  No unauthorized prescriptions since last refill.    Assessment:  Opioid Dependence  Comment: condition  gradually improving on current dose, actively participating in group.  Plan:  Continue buprenorphine, reviewed criteria for tapering when patient is ready.   Patient is counseled regarding relapse prevention, involvement in recovery groups, potential side effects of buprenorphine, and eventual taper of medication.    We discussed the patients current medications. The patient expressed understanding and no barriers to adherence were identified.  1. The patient indicates understanding of these issues and agrees with the plan.  Brief care plan is updated and reviewed with the patient.   2. The patient is given an After Visit Summary sheet that lists all medications with directions, allergies, orders placed during this encounter, and follow-up instructions.   3. I reviewed the patient's medical information and medical history   4. I reconciled the patient's medication list and prepared and supplied needed refills.   5. I have reviewed the past medical, family, and social history sections including the medications and allergies.    [name], MD

## 2017-03-03 NOTE — Telephone Encounter (Signed)
Booked mamm[facility] on 03/23/17@1040am  [facility]

## 2017-03-04 LAB — CANNABINOIDS URINE: CANNABINOIDS URINE: POSITIVE — AB

## 2017-03-04 LAB — METHADONE URINE: METHADONE URINE: NEGATIVE

## 2017-03-04 LAB — AMPHETAMINES URINE: AMPHETAMINES URINE: NEGATIVE

## 2017-03-04 LAB — OXYCODONE SCREEN URINE
OXYCOD SCRN URINE: NEGATIVE
OXYCOD UR SPEC GRAV: 1.01 (ref 1.003–1.035)
OXYCOD URINE CREAT: 51 md/dL
OXYCOD URINE PH: 7 (ref 4.5–8.5)

## 2017-03-04 LAB — BENZODIAZEPINES URINE: BENZODIAZEPINES URINE: NEGATIVE

## 2017-03-04 LAB — OPIATES URINE: OPIATES URINE: NEGATIVE

## 2017-03-04 LAB — COCAINE METABOLITES URINE: COCAINE METABOLITES URINE: NEGATIVE

## 2017-03-04 LAB — BUPRENORPHINE SCREEN URINE: BUPRENORPHINE SCREEN URINE: POSITIVE

## 2017-03-04 LAB — ETHANOL URINE: ETHANOL URINE: NEGATIVE

## 2017-03-04 LAB — FENTANYL URINE: FENTANYL URINE: NEGATIVE

## 2017-03-10 ENCOUNTER — Ambulatory Visit (HOSPITAL_BASED_OUTPATIENT_CLINIC_OR_DEPARTMENT_OTHER): Payer: Commercial Managed Care - HMO | Admitting: Internal Medicine

## 2017-03-10 ENCOUNTER — Other Ambulatory Visit (HOSPITAL_BASED_OUTPATIENT_CLINIC_OR_DEPARTMENT_OTHER): Payer: Self-pay | Admitting: Internal Medicine

## 2017-03-10 DIAGNOSIS — F1120 Opioid dependence, uncomplicated: Secondary | ICD-10-CM

## 2017-03-10 DIAGNOSIS — Z23 Encounter for immunization: Secondary | ICD-10-CM

## 2017-03-10 MED ORDER — BUPRENORPHINE HCL-NALOXONE HCL 8-2 MG SL FILM: 2 | Film | Freq: Every day | SUBLINGUAL | 0 refills | 0 days | Status: DC

## 2017-03-10 MED ORDER — BUPRENORPHINE HCL-NALOXONE HCL 8-2 MG SL FILM
2.0000 | ORAL_FILM | Freq: Every day | SUBLINGUAL | 0 refills | Status: DC
Start: 2017-03-10 — End: 2017-03-11

## 2017-03-11 ENCOUNTER — Other Ambulatory Visit (HOSPITAL_BASED_OUTPATIENT_CLINIC_OR_DEPARTMENT_OTHER): Payer: Self-pay | Admitting: Internal Medicine

## 2017-03-11 DIAGNOSIS — F1120 Opioid dependence, uncomplicated: Secondary | ICD-10-CM

## 2017-03-11 DIAGNOSIS — Z23 Encounter for immunization: Secondary | ICD-10-CM

## 2017-03-11 MED ORDER — BUPRENORPHINE HCL-NALOXONE HCL 8-2 MG SL FILM: 2 | Film | Freq: Every day | SUBLINGUAL | 0 refills | 0 days | Status: DC

## 2017-03-11 MED ORDER — BUPRENORPHINE HCL-NALOXONE HCL 8-2 MG SL FILM
2.0000 | ORAL_FILM | Freq: Every day | SUBLINGUAL | 0 refills | Status: DC
Start: 2017-03-11 — End: 2017-03-17

## 2017-03-11 NOTE — Progress Notes (Signed)
I called patient to check how she is doing.  She got busy and preoccupied yesterday. She is having mild withdrawal symptoms -- aches, restless legs, mild stomach cramps.  However she can manage with aspirin. No vomiting or diarrhea.  She will come next week.  Prescription sent electronically to preferred pharmacy.

## 2017-03-11 NOTE — Progress Notes (Signed)
PER Patient (self), Cheryl Dixon is a 61 year old female has requested a refill of      - suboxone 8-2 film - Patient had missed her group yesterday, and could not pick up the hard copy. Pt would like the med to be e- prescribed to the pharmacy     - Please fax the prescription to [facility] NUMBER: [phone]       Last Office Visit: 03-03-17 with pcp  Last Physical Exam: 05-27-13      Other Med Adult:  Most Recent BP Reading(s)  02/24/17 : Marland Kitchen 138/96        Cholesterol (mg/dl)   Date Value   01/04/2010 264 (H)     LOW DENSITY LIPOPROTEIN DIRECT (mg/dl)   Date Value   01/04/2010 101 (H)     HIGH DENSITY LIPOPROTEIN (mg/dl)   Date Value   01/04/2010 38     No results found for: TG      THYROID SCREEN TSH REFLEX FT4 (uIU/mL)   Date Value   05/27/2013 2.130         No results found for: TSH    HEMOGLOBIN A1C (%)   Date Value   05/27/2013 5.9 (H)       No results found for: POCA1C      INR (no units)   Date Value   02/16/2007 1.0 (L)   07/03/2006 < 1.0 (L)       SODIUM (mmol/L)   Date Value   07/17/2014 140       POTASSIUM (mmol/L)   Date Value   07/17/2014 4.2           CREATININE (mg/dL)   Date Value   07/17/2014 1.0       Documented patient preferred pharmacies:    [facility], [place], [place]  Phone: [phone] Fax: [phone]

## 2017-03-13 ENCOUNTER — Telehealth (HOSPITAL_BASED_OUTPATIENT_CLINIC_OR_DEPARTMENT_OTHER): Payer: Self-pay | Admitting: Registered Nurse

## 2017-03-13 NOTE — Progress Notes (Signed)
Checked in with Cheryl Dixon this morning to see if she was feeling better.  She states she was much better today.  I made sure she had my contact number should she have any needs going forward.  She stated that she would see me Tuesday in group meeting.

## 2017-03-17 ENCOUNTER — Ambulatory Visit (HOSPITAL_BASED_OUTPATIENT_CLINIC_OR_DEPARTMENT_OTHER): Payer: Commercial Managed Care - HMO | Admitting: Internal Medicine

## 2017-03-17 ENCOUNTER — Encounter (HOSPITAL_BASED_OUTPATIENT_CLINIC_OR_DEPARTMENT_OTHER): Payer: Self-pay | Admitting: Internal Medicine

## 2017-03-17 ENCOUNTER — Other Ambulatory Visit (HOSPITAL_BASED_OUTPATIENT_CLINIC_OR_DEPARTMENT_OTHER): Payer: Self-pay | Admitting: Internal Medicine

## 2017-03-17 DIAGNOSIS — Z23 Encounter for immunization: Secondary | ICD-10-CM

## 2017-03-17 DIAGNOSIS — F1120 Opioid dependence, uncomplicated: Secondary | ICD-10-CM

## 2017-03-17 MED ORDER — BUPRENORPHINE HCL-NALOXONE HCL 8-2 MG SL FILM: 2 | Film | Freq: Every day | SUBLINGUAL | 0 refills | 0 days | Status: DC

## 2017-03-17 MED ORDER — BUPRENORPHINE HCL-NALOXONE HCL 8-2 MG SL FILM
2.0000 | ORAL_FILM | Freq: Every day | SUBLINGUAL | 0 refills | Status: DC
Start: 2017-03-17 — End: 2017-03-17

## 2017-03-17 MED ORDER — BUPRENORPHINE HCL-NALOXONE HCL 8-2 MG SL FILM
2.0000 | ORAL_FILM | Freq: Every day | SUBLINGUAL | 0 refills | Status: DC
Start: 2017-03-17 — End: 2017-03-18

## 2017-03-17 NOTE — Progress Notes (Signed)
Cheryl Dixon is a 61 year old female seen in follow up for opioid dependence:    Buprenorphine dose: 16 mg  Response, adequacy of dose: starting to feel some withdrawal symptoms during the day, which is better if she splits the dose more (1/0.5/0.5)  Relapses/close calls: has been taking clonazepam intermittently for anxiety.  Feels like they help her eat better and motivating her.  Trigger: anxiety  Meetings: no    Social history/events:  "It's like being reborn - like being normal again"  She is much more active  Fighting for things that are needed at her house   Active in the community again.    Does not wish to see psychopharm  Thinking about working with a therapist    Present Medications:    Current Outpatient Medications on File Prior to Visit:  [DISCONTINUED] buprenorphine-naloxone (SUBOXONE) 8-2 MG FILM Place 2 Film under the tongue daily Max Daily Amount: 2 Film for 7 days Disp: 14 Film Rfl: 0   nicotine (NICODERM CQ) 21 MG/24HR Place 1 patch onto the skin daily Disp: 30 patch Rfl: 0   umeclidinium (INCRUSE ELLIPTA) 62.5 MCG/INH inhaler Inhale 1 puff into the lungs daily Disp: 1 Inhaler Rfl: 2   albuterol (PROVENTIL HFA,VENTOLIN HFA, PROAIR HFA) 108 (90 Base) MCG/ACT inhaler Inhale 2 puffs into the lungs every 6 (six) hours as needed for Wheezing or Shortness of breath Disp: 1 Inhaler Rfl: 2   naproxen (NAPROSYN) 500 MG tablet Take 1 tablet by mouth 2 (two) times daily with meals for 15 days Disp: 30 tablet Rfl: 0   docusate sodium (COLACE) 100 MG capsule Take 1 capsule by mouth 2 (two) times daily Disp: 60 capsule Rfl: 11   buPROPion (WELLBUTRIN SR) 150 MG 12 hr tablet Take 1 tablet by mouth 2 (two) times daily Disp: 60 tablet Rfl: 2   ipratropium-albuterol (DUO-NEB) 0.5-2.5 (3) MG/3ML SOLN Inhalation Solution Take 3 mLs by nebulization 4 (four) times daily. Disp: 180 mL Rfl: 0     No current facility-administered medications on file prior to visit.     Review of Systems:  Neurological exam:  negative  PHQ-9 TOTAL SCORE 03/03/2017 01/05/2017 11/27/2016   Doc FlowSheet Total Score - - -   Doc FlowSheet Total Score 4 22 18            PHYSICAL EXAMINATION:  General appearance - healthy female in no distress  Eyes - pupils 3 mm  Skin - warm and dry  Neuro - nonfocal  Affect - normal    PMP reviewed.  No unauthorized prescriptions since last refill.    Assessment:  Opioid Dependence  Comment: intermittent relapses on clonazepam  Plan:  We discussed working with a therapist to find alternate strategies for dealing with anxiety, partly because clonazepam use supports old habits of seeking chemical solutions for physical and mental discomfort.  Patient is counseled regarding relapse prevention, involvement in recovery groups, potential side effects of buprenorphine, and eventual taper of medication.    We discussed the patients current medications. The patient expressed understanding and no barriers to adherence were identified.  1. The patient indicates understanding of these issues and agrees with the plan.  Brief care plan is updated and reviewed with the patient.   2. The patient is given an After Visit Summary sheet that lists all medications with directions, allergies, orders placed during this encounter, and follow-up instructions.   3. I reviewed the patient's medical information and medical history   4. I reconciled  the patient's medication list and prepared and supplied needed refills.   5. I have reviewed the past medical, family, and social history sections including the medications and allergies.    [name], MD

## 2017-03-17 NOTE — Progress Notes (Signed)
PMP reviewed.  No unauthorized prescriptions since last refill.  Recent UDS reviewed, results as expected.  Buprenorphine prescription completed for group today.

## 2017-03-18 ENCOUNTER — Other Ambulatory Visit (HOSPITAL_BASED_OUTPATIENT_CLINIC_OR_DEPARTMENT_OTHER): Payer: Self-pay | Admitting: Internal Medicine

## 2017-03-18 DIAGNOSIS — F1120 Opioid dependence, uncomplicated: Secondary | ICD-10-CM

## 2017-03-18 DIAGNOSIS — Z23 Encounter for immunization: Secondary | ICD-10-CM

## 2017-03-18 LAB — COCAINE METABOLITES URINE: COCAINE METABOLITES URINE: NEGATIVE

## 2017-03-18 LAB — FENTANYL URINE: FENTANYL URINE: NEGATIVE

## 2017-03-18 LAB — OXYCODONE SCREEN URINE
OXYCOD SCRN URINE: NEGATIVE
OXYCOD UR SPEC GRAV: 1.015 (ref 1.003–1.035)
OXYCOD URINE CREAT: 132 md/dL
OXYCOD URINE PH: 7 (ref 4.5–8.5)

## 2017-03-18 LAB — CANNABINOIDS URINE: CANNABINOIDS URINE: POSITIVE — AB

## 2017-03-18 LAB — METHADONE URINE: METHADONE URINE: NEGATIVE

## 2017-03-18 LAB — BENZODIAZEPINES URINE: BENZODIAZEPINES URINE: NEGATIVE

## 2017-03-18 LAB — ETHANOL URINE: ETHANOL URINE: NEGATIVE

## 2017-03-18 LAB — AMPHETAMINES URINE: AMPHETAMINES URINE: NEGATIVE

## 2017-03-18 LAB — BUPRENORPHINE SCREEN URINE: BUPRENORPHINE SCREEN URINE: POSITIVE

## 2017-03-18 LAB — OPIATES URINE: OPIATES URINE: NEGATIVE

## 2017-03-18 MED ORDER — BUPRENORPHINE HCL-NALOXONE HCL 8-2 MG SL FILM: 2 | Film | Freq: Every day | SUBLINGUAL | 0 refills | 0 days | Status: DC

## 2017-03-18 MED ORDER — BUPRENORPHINE HCL-NALOXONE HCL 8-2 MG SL FILM
2.0000 | ORAL_FILM | Freq: Every day | SUBLINGUAL | 0 refills | Status: DC
Start: 2017-03-18 — End: 2017-03-24

## 2017-03-18 NOTE — Progress Notes (Signed)
PER Patient (self), Cheryl Dixon is a 61 year old female has requested a refill of Suboxone    Please note: script from 11/20 is market as [facility], patient called stating she did not get script yesterday. Please review.     Last prescribed:   Start Date: 03/17/17 End Date: 03/24/17          Last Office Visit: 03/17/17 with Dr.pcp  Last Physical Exam: 05/27/09      Other Med Adult:  Most Recent BP Reading(s)  02/24/17 : Marland Kitchen 138/96        Cholesterol (mg/dl)   Date Value   01/04/2010 264 (H)     LOW DENSITY LIPOPROTEIN DIRECT (mg/dl)   Date Value   01/04/2010 101 (H)     HIGH DENSITY LIPOPROTEIN (mg/dl)   Date Value   01/04/2010 38     No results found for: TG      THYROID SCREEN TSH REFLEX FT4 (uIU/mL)   Date Value   05/27/2013 2.130         No results found for: TSH    HEMOGLOBIN A1C (%)   Date Value   05/27/2013 5.9 (H)       No results found for: POCA1C      INR (no units)   Date Value   02/16/2007 1.0 (L)   07/03/2006 < 1.0 (L)       SODIUM (mmol/L)   Date Value   07/17/2014 140       POTASSIUM (mmol/L)   Date Value   07/17/2014 4.2           CREATININE (mg/dL)   Date Value   07/17/2014 1.0       Documented patient preferred pharmacies:    [place], [place], [place]  Phone: [phone] Fax: [phone]

## 2017-03-23 ENCOUNTER — Ambulatory Visit: Payer: Self-pay | Admitting: Internal Medicine

## 2017-03-24 ENCOUNTER — Other Ambulatory Visit (HOSPITAL_BASED_OUTPATIENT_CLINIC_OR_DEPARTMENT_OTHER): Payer: Self-pay | Admitting: Internal Medicine

## 2017-03-24 ENCOUNTER — Ambulatory Visit (HOSPITAL_BASED_OUTPATIENT_CLINIC_OR_DEPARTMENT_OTHER): Payer: Commercial Managed Care - HMO | Admitting: Internal Medicine

## 2017-03-24 DIAGNOSIS — Z23 Encounter for immunization: Secondary | ICD-10-CM

## 2017-03-24 DIAGNOSIS — F1120 Opioid dependence, uncomplicated: Secondary | ICD-10-CM

## 2017-03-24 MED ORDER — BUPRENORPHINE HCL-NALOXONE HCL 8-2 MG SL FILM
2.0000 | ORAL_FILM | Freq: Every day | SUBLINGUAL | 0 refills | Status: DC
Start: 2017-03-24 — End: 2017-03-27

## 2017-03-24 MED ORDER — BUPRENORPHINE HCL-NALOXONE HCL 8-2 MG SL FILM: 2 | Film | Freq: Every day | SUBLINGUAL | 0 refills | 0 days | Status: DC

## 2017-03-26 ENCOUNTER — Telehealth (HOSPITAL_BASED_OUTPATIENT_CLINIC_OR_DEPARTMENT_OTHER): Payer: Self-pay | Admitting: Internal Medicine

## 2017-03-26 DIAGNOSIS — G4733 Obstructive sleep apnea (adult) (pediatric): Secondary | ICD-10-CM | POA: Diagnosis not present

## 2017-03-26 NOTE — Progress Notes (Signed)
PT CALLING in regards to suboxone rx wants to know If she has to come and pick it up from the office. She said she is aware that she missed her group but did speak to Dr. Lisa Roca about it. Please give her a call at [phone]. Thank You

## 2017-03-27 ENCOUNTER — Other Ambulatory Visit (HOSPITAL_BASED_OUTPATIENT_CLINIC_OR_DEPARTMENT_OTHER): Payer: Self-pay | Admitting: Internal Medicine

## 2017-03-27 DIAGNOSIS — Z23 Encounter for immunization: Secondary | ICD-10-CM

## 2017-03-27 DIAGNOSIS — F1120 Opioid dependence, uncomplicated: Secondary | ICD-10-CM

## 2017-03-27 DIAGNOSIS — R799 Abnormal finding of blood chemistry, unspecified: Secondary | ICD-10-CM | POA: Diagnosis not present

## 2017-03-27 DIAGNOSIS — R5382 Chronic fatigue, unspecified: Secondary | ICD-10-CM | POA: Diagnosis not present

## 2017-03-27 DIAGNOSIS — E039 Hypothyroidism, unspecified: Secondary | ICD-10-CM | POA: Diagnosis not present

## 2017-03-27 DIAGNOSIS — D510 Vitamin B12 deficiency anemia due to intrinsic factor deficiency: Secondary | ICD-10-CM | POA: Diagnosis not present

## 2017-03-27 NOTE — Progress Notes (Signed)
PER Pharmacy, Cheryl Dixon is a 61 year old female has requested a refill of Suboxone 8-2 mg- please resend RX from 03/24/17 which was marked [facility].      Last Office Visit: 03/17/17 with D Roll  Last Physical Exam: 05/27/13      Other Med Adult:  Most Recent BP Reading(s)  02/24/17 : (!) 138/96        Cholesterol (mg/dl)   Date Value   01/04/2010 264 (H)     LOW DENSITY LIPOPROTEIN DIRECT (mg/dl)   Date Value   01/04/2010 101 (H)     HIGH DENSITY LIPOPROTEIN (mg/dl)   Date Value   01/04/2010 38     No results found for: TG      THYROID SCREEN TSH REFLEX FT4 (uIU/mL)   Date Value   05/27/2013 2.130         No results found for: TSH    HEMOGLOBIN A1C (%)   Date Value   05/27/2013 5.9 (H)       No results found for: POCA1C      INR (no units)   Date Value   02/16/2007 1.0 (L)   07/03/2006 < 1.0 (L)       SODIUM (mmol/L)   Date Value   07/17/2014 140       POTASSIUM (mmol/L)   Date Value   07/17/2014 4.2           CREATININE (mg/dL)   Date Value   07/17/2014 1.0       Documented patient preferred pharmacies:    [place], [place], [place]  Phone: [phone] Fax: [phone]

## 2017-03-27 NOTE — Progress Notes (Signed)
Pls see message from pt, states Rolly Pancake has not received suboxone rx.   Will route to MD to have him resubmit  Lannie Fields, RN, 03/27/2017, 10:38 AM

## 2017-03-27 NOTE — Progress Notes (Signed)
Patient missed appointment Tuesday because she was sick.  I reminded her to call any day she can't make it.  She said she called but I never got a message about it.  She will set up the ride so it is easier to get here every week  Prescription sent electronically to preferred pharmacy.     Sometimes varying her dose of suboxone during the day -- typically 1/2 in am, 1/2 in afternoon, and 1 in evening.  I told her that she should be trying to take a consistent dose at consistent times every day, ultimately getting to the point where she is taking full dose in the morning.

## 2017-03-27 NOTE — Progress Notes (Signed)
See refill encounter from today.  I sent in rx

## 2017-03-31 ENCOUNTER — Ambulatory Visit (HOSPITAL_BASED_OUTPATIENT_CLINIC_OR_DEPARTMENT_OTHER): Payer: Commercial Managed Care - HMO | Admitting: Internal Medicine

## 2017-03-31 ENCOUNTER — Other Ambulatory Visit (HOSPITAL_BASED_OUTPATIENT_CLINIC_OR_DEPARTMENT_OTHER): Payer: Self-pay | Admitting: Internal Medicine

## 2017-03-31 VITALS — HR 83 | Temp 98.6°F

## 2017-03-31 DIAGNOSIS — Z23 Encounter for immunization: Secondary | ICD-10-CM

## 2017-03-31 DIAGNOSIS — F1120 Opioid dependence, uncomplicated: Secondary | ICD-10-CM

## 2017-03-31 MED ORDER — OXYMETAZOLINE HCL 0.05 % NA SOLN
2.0000 | Freq: Two times a day (BID) | NASAL | 0 refills | Status: AC
Start: 2017-03-31 — End: 2017-04-03

## 2017-03-31 MED ORDER — AZITHROMYCIN 250 MG PO TABS
250.00 mg | ORAL_TABLET | ORAL | 0 refills | Status: AC
Start: 2017-03-31 — End: 2017-04-06

## 2017-03-31 MED ORDER — BUPRENORPHINE HCL-NALOXONE HCL 8-2 MG SL FILM: 2 | Film | Freq: Every day | SUBLINGUAL | 0 refills | 0 days | Status: DC

## 2017-03-31 MED ORDER — AZITHROMYCIN 250 MG PO TABS: 250 mg | tablet | ORAL | 0 refills | 0 days | Status: AC

## 2017-03-31 MED ORDER — BUPRENORPHINE HCL-NALOXONE HCL 8-2 MG SL FILM
2.0000 | ORAL_FILM | Freq: Every day | SUBLINGUAL | 0 refills | Status: DC
Start: 2017-03-31 — End: 2017-04-07

## 2017-03-31 MED ORDER — OXYMETAZOLINE HCL 0.05 % NA SOLN: 2 | mL | Freq: Two times a day (BID) | NASAL | 0 refills | 0 days | Status: AC

## 2017-03-31 NOTE — Progress Notes (Signed)
Cheryl Dixon is a 61 year old female seen in follow up for opioid dependence:    Buprenorphine dose: 16 mg  Response, adequacy of dose: good  Relapses/close calls: none  Trigger: n/a  Meetings: no    Congestion  Sore throat  Ear pain  Cough worse in the morning for about 10 days  Smoking 1-3 cigarettes per day  Cough is productive of green yellow sputum  Initially was getting better but then got worse again  + sob    Present Medications:    Current Outpatient Medications on File Prior to Visit:  [DISCONTINUED] buprenorphine-naloxone (SUBOXONE) 8-2 MG FILM Place 2 Film under the tongue daily Max Daily Amount: 2 Film for 7 days Disp: 14 Film Rfl: 0   umeclidinium (INCRUSE ELLIPTA) 62.5 MCG/INH inhaler Inhale 1 puff into the lungs daily Disp: 1 Inhaler Rfl: 2   albuterol (PROVENTIL HFA,VENTOLIN HFA, PROAIR HFA) 108 (90 Base) MCG/ACT inhaler Inhale 2 puffs into the lungs every 6 (six) hours as needed for Wheezing or Shortness of breath Disp: 1 Inhaler Rfl: 2   naproxen (NAPROSYN) 500 MG tablet Take 1 tablet by mouth 2 (two) times daily with meals for 15 days Disp: 30 tablet Rfl: 0   docusate sodium (COLACE) 100 MG capsule Take 1 capsule by mouth 2 (two) times daily Disp: 60 capsule Rfl: 11   buPROPion (WELLBUTRIN SR) 150 MG 12 hr tablet Take 1 tablet by mouth 2 (two) times daily Disp: 60 tablet Rfl: 2   ipratropium-albuterol (DUO-NEB) 0.5-2.5 (3) MG/3ML SOLN Inhalation Solution Take 3 mLs by nebulization 4 (four) times daily. Disp: 180 mL Rfl: 0     No current facility-administered medications on file prior to visit.     Review of Systems:  Neurological exam: negative  PHQ-9 TOTAL SCORE 03/03/2017 01/05/2017 11/27/2016   Doc FlowSheet Total Score - - -   Doc FlowSheet Total Score 4 22 18            PHYSICAL EXAMINATION:  Pulse 83  Temp 98.6 F (37 C)  LMP 11/20/1991  SpO2 97%   General appearance - healthy female in no distress  Eyes - pupils 3 mm  Patient appears mildly ill, temp as noted above. Ears are normal. Throat  normal. Nasal passages are congested. Moderate maxillary sinus tenderness is noted. No significant neck adenopathy. Chest is clear to IPPA. No rashes noted.  Skin - warm and dry  Neuro - nonfocal  Affect - normal    PMP reviewed.  No unauthorized prescriptions since last refill.    Assessment:  Opioid Dependence  Comment: condition gradually improving on current dose, actively participating in group.  Plan:  Continue buprenorphine, reviewed criteria for tapering when patient is ready.  Patient is counseled regarding relapse prevention, involvement in recovery groups, potential side effects of buprenorphine, and eventual taper of medication.    Acute sinusitis  bisphasic illness with maxillary sinus tenderness  - azithromycin  - oxymetazoline  - Follow up if symptoms worsening or not improving as expected     We discussed the patients current medications. The patient expressed understanding and no barriers to adherence were identified.  1. The patient indicates understanding of these issues and agrees with the plan.  Brief care plan is updated and reviewed with the patient.   2. The patient is given an After Visit Summary sheet that lists all medications with directions, allergies, orders placed during this encounter, and follow-up instructions.   3. I reviewed the patient's medical information  and medical history   4. I reconciled the patient's medication list and prepared and supplied needed refills.   5. I have reviewed the past medical, family, and social history sections including the medications and allergies.    [name], MD

## 2017-04-01 LAB — OXYCODONE SCREEN URINE
OXYCOD SCRN URINE: NEGATIVE
OXYCOD UR SPEC GRAV: 1.015 (ref 1.003–1.035)
OXYCOD URINE CREAT: 141 md/dL
OXYCOD URINE PH: 6.5 (ref 4.5–8.5)

## 2017-04-01 LAB — BUPRENORPHINE SCREEN URINE: BUPRENORPHINE SCREEN URINE: POSITIVE

## 2017-04-01 LAB — ETHANOL URINE: ETHANOL URINE: NEGATIVE

## 2017-04-01 LAB — COCAINE METABOLITES URINE: COCAINE METABOLITES URINE: NEGATIVE

## 2017-04-01 LAB — OPIATES URINE: OPIATES URINE: NEGATIVE

## 2017-04-01 LAB — BENZODIAZEPINES URINE: BENZODIAZEPINES URINE: NEGATIVE

## 2017-04-07 ENCOUNTER — Other Ambulatory Visit (HOSPITAL_BASED_OUTPATIENT_CLINIC_OR_DEPARTMENT_OTHER): Payer: Self-pay | Admitting: Internal Medicine

## 2017-04-07 ENCOUNTER — Encounter (HOSPITAL_BASED_OUTPATIENT_CLINIC_OR_DEPARTMENT_OTHER): Payer: Self-pay | Admitting: Internal Medicine

## 2017-04-07 ENCOUNTER — Ambulatory Visit (HOSPITAL_BASED_OUTPATIENT_CLINIC_OR_DEPARTMENT_OTHER): Payer: Commercial Managed Care - HMO | Admitting: Internal Medicine

## 2017-04-07 DIAGNOSIS — F411 Generalized anxiety disorder: Secondary | ICD-10-CM

## 2017-04-07 DIAGNOSIS — Z23 Encounter for immunization: Secondary | ICD-10-CM

## 2017-04-07 DIAGNOSIS — F1120 Opioid dependence, uncomplicated: Secondary | ICD-10-CM

## 2017-04-07 DIAGNOSIS — J3489 Other specified disorders of nose and nasal sinuses: Secondary | ICD-10-CM

## 2017-04-07 LAB — FENTANYL URINE: FENTANYL URINE: NEGATIVE

## 2017-04-07 LAB — OXYCODONE SCREEN URINE
OXYCOD SCRN URINE: NEGATIVE
OXYCOD UR SPEC GRAV: 1.025 (ref 1.003–1.035)
OXYCOD URINE CREAT: 208 md/dL
OXYCOD URINE PH: 6 (ref 4.5–8.5)

## 2017-04-07 LAB — CANNABINOIDS URINE: CANNABINOIDS URINE: POSITIVE — AB

## 2017-04-07 LAB — ETHANOL URINE: ETHANOL URINE: NEGATIVE

## 2017-04-07 LAB — BENZODIAZEPINES URINE: BENZODIAZEPINES URINE: NEGATIVE

## 2017-04-07 LAB — BUPRENORPHINE SCREEN URINE: BUPRENORPHINE SCREEN URINE: POSITIVE

## 2017-04-07 LAB — OPIATES URINE: OPIATES URINE: NEGATIVE

## 2017-04-07 LAB — COCAINE METABOLITES URINE: COCAINE METABOLITES URINE: NEGATIVE

## 2017-04-07 LAB — AMPHETAMINES URINE: AMPHETAMINES URINE: NEGATIVE

## 2017-04-07 LAB — METHADONE URINE: METHADONE URINE: NEGATIVE

## 2017-04-07 MED ORDER — NICOTINE 14 MG/24HR TD PT24: 1 | patch | TRANSDERMAL | 0 refills | 0 days | Status: DC

## 2017-04-07 MED ORDER — BUPRENORPHINE HCL-NALOXONE HCL 8-2 MG SL FILM
2.0000 | ORAL_FILM | Freq: Every day | SUBLINGUAL | 0 refills | Status: DC
Start: 2017-04-07 — End: 2017-04-14

## 2017-04-07 MED ORDER — NICOTINE 14 MG/24HR TD PT24
1.0000 | MEDICATED_PATCH | TRANSDERMAL | 0 refills | Status: DC
Start: 2017-04-07 — End: 2017-06-10

## 2017-04-07 MED ORDER — BUPRENORPHINE HCL-NALOXONE HCL 8-2 MG SL FILM: 2 | Film | Freq: Every day | SUBLINGUAL | 0 refills | 0 days | Status: DC

## 2017-04-07 NOTE — Progress Notes (Signed)
Cheryl Dixon is a 61 year old female seen in follow up for opioid dependence:    Buprenorphine dose: 16 mg   Response, adequacy of dose: good  Relapses/close calls: none  Trigger: n/a  Meetings: no    Social history/events:  Doing well   Preparing for the holidays  Doing well with urge to use     Sinus infection   Getting over a sinus infection   Was on medication for 6 days - zpack and afrin x 3 days   But still feeling congested on one side of her face  Wondering if she still has an infection  Smokes 1 cigarette a day, needs lower dose because she is getting sick from it   No headaches or fevers recently   Has not tried any rinses     ?withdrawal symptoms  This weekend started getting what she thinks is withdrawal symptoms   She used to get bad stomach issues/reflux when she was going through withdrawal with methadone  Was on methadone for 13 years, stopped last month   Every time she adjusted methadone this would happen  She wonders if this is from adjusting suboxone dosing as well   Gets leg irritation and cramps as well  Took seroquel, went to sleep and felt better   Has appointment with Dr. Owens Shark in 2 days for her stomach issues but she says they have resolved since stopping methadone and may cancel the appointment     Anxiety/Depression  Taking wellbutrin only once daily, doesn't feel like she needs it bid    Only feels really depressed when she's feeling sick   Would not like therapy right now   Uses 1 mg of klonopin every now and then for anxiety   She doesn't really want psych or therapy     Present Medications:    Current Outpatient Medications on File Prior to Visit:  [EXPIRED] azithromycin (ZITHROMAX) 250 MG tablet Take 1 tablet by mouth See Admin Instructions 2 TABLETS TODAY, THEN 1 TABLET DAILY THEREAFTER for 6 days Disp: 6 tablet Rfl: 0   [DISCONTINUED] buprenorphine-naloxone (SUBOXONE) 8-2 MG FILM Place 2 Film under the tongue daily Max Daily Amount: 2 Film for 7 days Disp: 14 Film Rfl: 0    umeclidinium (INCRUSE ELLIPTA) 62.5 MCG/INH inhaler Inhale 1 puff into the lungs daily Disp: 1 Inhaler Rfl: 2   albuterol (PROVENTIL HFA,VENTOLIN HFA, PROAIR HFA) 108 (90 Base) MCG/ACT inhaler Inhale 2 puffs into the lungs every 6 (six) hours as needed for Wheezing or Shortness of breath Disp: 1 Inhaler Rfl: 2   naproxen (NAPROSYN) 500 MG tablet Take 1 tablet by mouth 2 (two) times daily with meals for 15 days Disp: 30 tablet Rfl: 0   docusate sodium (COLACE) 100 MG capsule Take 1 capsule by mouth 2 (two) times daily Disp: 60 capsule Rfl: 11   buPROPion (WELLBUTRIN SR) 150 MG 12 hr tablet Take 1 tablet by mouth 2 (two) times daily Disp: 60 tablet Rfl: 2   ipratropium-albuterol (DUO-NEB) 0.5-2.5 (3) MG/3ML SOLN Inhalation Solution Take 3 mLs by nebulization 4 (four) times daily. Disp: 180 mL Rfl: 0     No current facility-administered medications on file prior to visit.     Review of Systems:  Neurological exam: negative  PHQ-9 TOTAL SCORE 03/03/2017 01/05/2017 11/27/2016   Doc FlowSheet Total Score - - -   Doc FlowSheet Total Score 4 22 18            PHYSICAL EXAMINATION:  General appearance - healthy female in no distress  Eyes - pupils 3 mm  Skin - warm and dry  Neuro - nonfocal  Affect - normal    PMP reviewed. No unauthorized prescriptions since last refill.     Assessment:  Opioid Dependence  Comment: Stable on current dose of suboxone. Actively participating in treatment.  Plan:  Continue current suboxone dose.   Patient is counseled regarding relapse prevention, involvement in recovery groups, potential side effects of buprenorphine, and eventual taper of medication.    Sinus pressure  I don't think she needs any further antibiotics. Discussed sinus rinse, supportive care     Anxiety  I think ideally she should be in therapy. Only taking wellbutrin once daily. She declines both of these right now.     In accordance with the opiate bill enacted on 07/10/14, the patient's condition is such that a non-opiate  medication was not appropriate and:  1. The treatment condition associated with the prescribing of an opiate/opioid medication is due to the addiction treatment, acute condition, chronic pain, cancer, or palliative care.    2. I have evaluated the patients current condition, risk factors, history of substance abuse, if any, and current medications.  3. I have informed the patient that the prescribed medication is an appropriate course of treatment based on the medical need of the patient.    4. I have consulted with the patient regarding the quantity of the opiate prescribed and informed the patients of her right to fill the prescription in a lesser quantity.  5. I have expressly informed the patient of the risks associated with the opiate prescribed.  6. I have entered into a pain management contract with this patient who is prescribed an extended-release long-acting opiate during the course of long-term pain management.  7. I have checked the Prescription Monitoring Program for this patient.     This documentation serves as a record of the services and decisions personally performed by Dr. Gerrit Heck. It was created by Landry Dyke (medical scribe) and was based on the provider's statements to me.

## 2017-04-09 ENCOUNTER — Ambulatory Visit (HOSPITAL_BASED_OUTPATIENT_CLINIC_OR_DEPARTMENT_OTHER): Payer: Commercial Managed Care - HMO | Admitting: Gastroenterology

## 2017-04-14 ENCOUNTER — Telehealth (HOSPITAL_BASED_OUTPATIENT_CLINIC_OR_DEPARTMENT_OTHER): Payer: Self-pay | Admitting: Registered Nurse

## 2017-04-14 ENCOUNTER — Ambulatory Visit (HOSPITAL_BASED_OUTPATIENT_CLINIC_OR_DEPARTMENT_OTHER): Payer: Commercial Managed Care - HMO | Admitting: Internal Medicine

## 2017-04-14 ENCOUNTER — Other Ambulatory Visit (HOSPITAL_BASED_OUTPATIENT_CLINIC_OR_DEPARTMENT_OTHER): Payer: Self-pay | Admitting: Internal Medicine

## 2017-04-14 DIAGNOSIS — Z23 Encounter for immunization: Secondary | ICD-10-CM

## 2017-04-14 DIAGNOSIS — F1120 Opioid dependence, uncomplicated: Secondary | ICD-10-CM

## 2017-04-14 MED ORDER — BUPRENORPHINE HCL-NALOXONE HCL 8-2 MG SL FILM: 2 | Film | Freq: Every day | SUBLINGUAL | 0 refills | 0 days | Status: AC

## 2017-04-14 MED ORDER — BUPRENORPHINE HCL-NALOXONE HCL 8-2 MG SL FILM: 2.0000 | ORAL_FILM | Freq: Every day | SUBLINGUAL | 0 refills | Status: DC

## 2017-04-14 MED ORDER — BUPRENORPHINE HCL-NALOXONE HCL 8-2 MG SL FILM
2.0000 | ORAL_FILM | Freq: Every day | SUBLINGUAL | 0 refills | Status: DC
Start: 2017-04-20 — End: 2017-05-05

## 2017-04-14 MED ORDER — BUPRENORPHINE HCL-NALOXONE HCL 8-2 MG SL FILM: 2 | Film | Freq: Every day | SUBLINGUAL | 0 refills | 0 days | Status: DC

## 2017-04-14 NOTE — Progress Notes (Signed)
Received a notice that Cheryl Dixon was not able to come to group today.  I called her back at her listed number and asked that she return my call.  I left my contact number on her machine.

## 2017-04-16 ENCOUNTER — Ambulatory Visit (HOSPITAL_BASED_OUTPATIENT_CLINIC_OR_DEPARTMENT_OTHER): Payer: Commercial Managed Care - HMO

## 2017-04-16 DIAGNOSIS — F1120 Opioid dependence, uncomplicated: Secondary | ICD-10-CM

## 2017-04-16 LAB — COCAINE METABOLITES URINE: COCAINE METABOLITES URINE: NEGATIVE

## 2017-04-16 LAB — OXYCODONE SCREEN URINE
OXYCOD SCRN URINE: NEGATIVE
OXYCOD UR SPEC GRAV: 1.01 (ref 1.003–1.035)
OXYCOD URINE CREAT: 76 md/dL
OXYCOD URINE PH: 7.5 (ref 4.5–8.5)

## 2017-04-16 LAB — METHADONE URINE: METHADONE URINE: NEGATIVE

## 2017-04-16 LAB — AMPHETAMINES URINE: AMPHETAMINES URINE: NEGATIVE

## 2017-04-16 LAB — FENTANYL URINE: FENTANYL URINE: NEGATIVE

## 2017-04-16 LAB — OPIATES URINE: OPIATES URINE: NEGATIVE

## 2017-04-16 LAB — BENZODIAZEPINES URINE: BENZODIAZEPINES URINE: NEGATIVE

## 2017-04-16 LAB — BUPRENORPHINE SCREEN URINE: BUPRENORPHINE SCREEN URINE: POSITIVE

## 2017-04-16 LAB — CANNABINOIDS URINE: CANNABINOIDS URINE: POSITIVE — AB

## 2017-04-16 LAB — ETHANOL URINE: ETHANOL URINE: NEGATIVE

## 2017-04-16 NOTE — Progress Notes (Signed)
urine collected  Cherene Julian, 04/16/2017, 1:20 PM

## 2017-04-17 NOTE — Progress Notes (Signed)
Expected result

## 2017-04-20 ENCOUNTER — Ambulatory Visit (HOSPITAL_BASED_OUTPATIENT_CLINIC_OR_DEPARTMENT_OTHER): Payer: Commercial Managed Care - HMO

## 2017-04-20 ENCOUNTER — Telehealth (HOSPITAL_BASED_OUTPATIENT_CLINIC_OR_DEPARTMENT_OTHER): Payer: Self-pay

## 2017-04-20 DIAGNOSIS — F1120 Opioid dependence, uncomplicated: Secondary | ICD-10-CM

## 2017-04-20 LAB — OXYCODONE SCREEN URINE
OXYCOD SCRN URINE: NEGATIVE
OXYCOD UR SPEC GRAV: 1.01 (ref 1.003–1.035)
OXYCOD URINE CREAT: 105 md/dL
OXYCOD URINE PH: 5.5 (ref 4.5–8.5)

## 2017-04-20 LAB — OPIATES URINE: OPIATES URINE: NEGATIVE

## 2017-04-20 LAB — BUPRENORPHINE SCREEN URINE: BUPRENORPHINE SCREEN URINE: POSITIVE

## 2017-04-20 LAB — CANNABINOIDS URINE: CANNABINOIDS URINE: POSITIVE — AB

## 2017-04-20 LAB — BENZODIAZEPINES URINE: BENZODIAZEPINES URINE: NEGATIVE

## 2017-04-20 LAB — METHADONE URINE: METHADONE URINE: NEGATIVE

## 2017-04-20 LAB — ETHANOL URINE: ETHANOL URINE: NEGATIVE

## 2017-04-20 LAB — AMPHETAMINES URINE: AMPHETAMINES URINE: NEGATIVE

## 2017-04-20 LAB — COCAINE METABOLITES URINE: COCAINE METABOLITES URINE: NEGATIVE

## 2017-04-20 LAB — FENTANYL URINE: FENTANYL URINE: NEGATIVE

## 2017-04-20 NOTE — Progress Notes (Signed)
Called home# lm to remind patient needs to come to office by 1pm

## 2017-04-20 NOTE — Progress Notes (Signed)
Urine collected . Patient picked up script .

## 2017-04-23 ENCOUNTER — Telehealth (HOSPITAL_BASED_OUTPATIENT_CLINIC_OR_DEPARTMENT_OTHER): Payer: Self-pay

## 2017-04-23 NOTE — Progress Notes (Signed)
Returned call to patient. Patient stated that she is short on her script asked when was last time filled Dec 21 for 7 days 14 films  Lee states patient filled script on   Written 11/30 filled 11/30-1 week  according to PMP  Written 12/4 filled  12/7- 1 week    according to PMP  written 12/11 filled 12/14-1 week   according to PMP  Written 12/18 filled 12/21-1 week   according to PMP  Written  12/24 will fill 12/28   When I explained to patient she stated that she will need to speak   Provider in regards to script because she is always short by 1-2 days. Asked patient if by chance she had taken more then she was supposed to and didn't realize it . Patient states she is not sure if that is what happened   .

## 2017-04-29 ENCOUNTER — Other Ambulatory Visit: Payer: Self-pay | Admitting: Rheumatology

## 2017-04-30 NOTE — Telephone Encounter (Signed)
Last Visit: 02/20/17 Next Visit: 08/27/17  Okay to refill per Dr. Estanislado Pandy

## 2017-05-05 ENCOUNTER — Other Ambulatory Visit (HOSPITAL_BASED_OUTPATIENT_CLINIC_OR_DEPARTMENT_OTHER): Payer: Self-pay | Admitting: Internal Medicine

## 2017-05-05 ENCOUNTER — Encounter (HOSPITAL_BASED_OUTPATIENT_CLINIC_OR_DEPARTMENT_OTHER): Payer: Self-pay | Admitting: Internal Medicine

## 2017-05-05 ENCOUNTER — Ambulatory Visit (HOSPITAL_BASED_OUTPATIENT_CLINIC_OR_DEPARTMENT_OTHER): Payer: Commercial Managed Care - HMO | Admitting: Internal Medicine

## 2017-05-05 DIAGNOSIS — F1120 Opioid dependence, uncomplicated: Secondary | ICD-10-CM

## 2017-05-05 DIAGNOSIS — Z23 Encounter for immunization: Secondary | ICD-10-CM

## 2017-05-05 MED ORDER — BUPRENORPHINE HCL-NALOXONE HCL 8-2 MG SL FILM: 2 | Film | Freq: Every day | SUBLINGUAL | 0 refills | 0 days | Status: DC

## 2017-05-05 MED ORDER — BUPRENORPHINE HCL-NALOXONE HCL 8-2 MG SL FILM
2.0000 | ORAL_FILM | Freq: Every day | SUBLINGUAL | 0 refills | Status: DC
Start: 2017-05-05 — End: 2017-05-05

## 2017-05-05 MED ORDER — BUPRENORPHINE HCL-NALOXONE HCL 8-2 MG SL FILM
2.0000 | ORAL_FILM | Freq: Every day | SUBLINGUAL | 0 refills | Status: DC
Start: 2017-05-05 — End: 2017-05-12

## 2017-05-05 NOTE — Progress Notes (Signed)
Cheryl Dixon is a 62 year old female seen in follow up for opioid dependence:    Buprenorphine dose: 16 mg   Response, adequacy of dose: good  Relapses/close calls: none  Trigger: n/a  Meetings: no    Social history/events:  The holidays were okay, has two children who don't speak to her   But spent the holidays with her other two children   Otherwise things going well     Present Medications:    Current Outpatient Medications on File Prior to Visit:  [EXPIRED] buprenorphine-naloxone (SUBOXONE) 8-2 MG FILM Place 2 Film under the tongue daily Max Daily Amount: 2 Film for 14 days Disp: 28 Film Rfl: 0   nicotine (NICODERM CQ) 14 MG/24HR Place 1 patch onto the skin daily Disp: 28 patch Rfl: 0   umeclidinium (INCRUSE ELLIPTA) 62.5 MCG/INH inhaler Inhale 1 puff into the lungs daily Disp: 1 Inhaler Rfl: 2   albuterol (PROVENTIL HFA,VENTOLIN HFA, PROAIR HFA) 108 (90 Base) MCG/ACT inhaler Inhale 2 puffs into the lungs every 6 (six) hours as needed for Wheezing or Shortness of breath Disp: 1 Inhaler Rfl: 2   naproxen (NAPROSYN) 500 MG tablet Take 1 tablet by mouth 2 (two) times daily with meals for 15 days Disp: 30 tablet Rfl: 0   docusate sodium (COLACE) 100 MG capsule Take 1 capsule by mouth 2 (two) times daily Disp: 60 capsule Rfl: 11   buPROPion (WELLBUTRIN SR) 150 MG 12 hr tablet Take 1 tablet by mouth 2 (two) times daily Disp: 60 tablet Rfl: 2   ipratropium-albuterol (DUO-NEB) 0.5-2.5 (3) MG/3ML SOLN Inhalation Solution Take 3 mLs by nebulization 4 (four) times daily. Disp: 180 mL Rfl: 0     No current facility-administered medications on file prior to visit.     Review of Systems:  Neurological exam: negative  PHQ-9 TOTAL SCORE 03/03/2017 01/05/2017 11/27/2016   Doc FlowSheet Total Score - - -   Doc FlowSheet Total Score 4 22 18            PHYSICAL EXAMINATION:  General appearance - healthy female in no distress  Eyes - pupils 3 mm  Skin - warm and dry  Neuro - nonfocal  Affect - normal    PMP reviewed. No unauthorized  prescriptions since last refill.     Assessment:  Opioid Dependence  Comment: Stable on current dose of suboxone. Actively participating in treatment.  Plan:  Continue current suboxone dose.   Patient is counseled regarding relapse prevention, involvement in recovery groups, potential side effects of buprenorphine, and eventual taper of medication.    In accordance with the opiate bill enacted on 07/10/14, the patient's condition is such that a non-opiate medication was not appropriate and:  1. The treatment condition associated with the prescribing of an opiate/opioid medication is due to the addiction treatment, acute condition, chronic pain, cancer, or palliative care.    2. I have evaluated the patients current condition, risk factors, history of substance abuse, if any, and current medications.  3. I have informed the patient that the prescribed medication is an appropriate course of treatment based on the medical need of the patient.    4. I have consulted with the patient regarding the quantity of the opiate prescribed and informed the patients of her right to fill the prescription in a lesser quantity.  5. I have expressly informed the patient of the risks associated with the opiate prescribed.  6. I have entered into a pain management contract with this patient  who is prescribed an extended-release long-acting opiate during the course of long-term pain management.  7. I have checked the Prescription Monitoring Program for this patient.     This documentation serves as a record of the services and decisions personally performed by Dr. Gerrit Heck. It was created by Landry Dyke (medical scribe) and was based on the provider's statements to me.

## 2017-05-06 LAB — COCAINE METABOLITES URINE: COCAINE METABOLITES URINE: NEGATIVE

## 2017-05-06 LAB — BUPRENORPHINE SCREEN URINE: BUPRENORPHINE SCREEN URINE: POSITIVE

## 2017-05-06 LAB — FENTANYL URINE: FENTANYL URINE: NEGATIVE

## 2017-05-06 LAB — OXYCODONE SCREEN URINE
OXYCOD SCRN URINE: NEGATIVE
OXYCOD UR SPEC GRAV: 1.005 (ref 1.003–1.035)
OXYCOD URINE CREAT: 51 md/dL
OXYCOD URINE PH: 6 (ref 4.5–8.5)

## 2017-05-06 LAB — METHADONE URINE: METHADONE URINE: NEGATIVE

## 2017-05-06 LAB — ETHANOL URINE: ETHANOL URINE: NEGATIVE

## 2017-05-06 LAB — BENZODIAZEPINES URINE: BENZODIAZEPINES URINE: NEGATIVE

## 2017-05-06 LAB — CANNABINOIDS URINE: CANNABINOIDS URINE: POSITIVE — AB

## 2017-05-06 LAB — AMPHETAMINES URINE: AMPHETAMINES URINE: NEGATIVE

## 2017-05-06 LAB — OPIATES URINE: OPIATES URINE: NEGATIVE

## 2017-05-07 DIAGNOSIS — Z01419 Encounter for gynecological examination (general) (routine) without abnormal findings: Secondary | ICD-10-CM | POA: Diagnosis not present

## 2017-05-07 DIAGNOSIS — Z6841 Body Mass Index (BMI) 40.0 and over, adult: Secondary | ICD-10-CM | POA: Diagnosis not present

## 2017-05-12 ENCOUNTER — Telehealth (HOSPITAL_BASED_OUTPATIENT_CLINIC_OR_DEPARTMENT_OTHER): Payer: Self-pay | Admitting: Internal Medicine

## 2017-05-12 ENCOUNTER — Other Ambulatory Visit (HOSPITAL_BASED_OUTPATIENT_CLINIC_OR_DEPARTMENT_OTHER): Payer: Self-pay | Admitting: Internal Medicine

## 2017-05-12 ENCOUNTER — Ambulatory Visit (HOSPITAL_BASED_OUTPATIENT_CLINIC_OR_DEPARTMENT_OTHER): Payer: Commercial Managed Care - HMO | Admitting: Internal Medicine

## 2017-05-12 DIAGNOSIS — F1120 Opioid dependence, uncomplicated: Secondary | ICD-10-CM

## 2017-05-12 DIAGNOSIS — Z23 Encounter for immunization: Secondary | ICD-10-CM

## 2017-05-12 DIAGNOSIS — Z13820 Encounter for screening for osteoporosis: Secondary | ICD-10-CM | POA: Diagnosis not present

## 2017-05-12 MED ORDER — BUPRENORPHINE HCL-NALOXONE HCL 8-2 MG SL FILM
2.0000 | ORAL_FILM | Freq: Every day | SUBLINGUAL | 0 refills | Status: DC
Start: 2017-05-12 — End: 2017-05-19

## 2017-05-12 MED ORDER — BUPRENORPHINE HCL-NALOXONE HCL 8-2 MG SL FILM: 2 | Film | Freq: Every day | SUBLINGUAL | 0 refills | 0 days | Status: DC

## 2017-05-12 NOTE — Progress Notes (Signed)
nurse from [facility] called the [facility] to complete a benefit analysis for the tdap Vaccine.    The vaccine is covered under the patient’s [employer] medical coverage.    Please choose  (Private)    If you do not have the vaccine in stock, please contact the [facility] department ([phone]) to have the vaccine delivered to your clinic

## 2017-05-12 NOTE — Telephone Encounter (Signed)
-----   Message from [facility] sent at 05/11/2017  4:15 PM EST -----  Regarding: TDAP Benefit Analysis  Please do benefit analysis for TDAP

## 2017-05-15 ENCOUNTER — Ambulatory Visit (HOSPITAL_BASED_OUTPATIENT_CLINIC_OR_DEPARTMENT_OTHER): Payer: Commercial Managed Care - HMO

## 2017-05-15 ENCOUNTER — Other Ambulatory Visit (HOSPITAL_BASED_OUTPATIENT_CLINIC_OR_DEPARTMENT_OTHER): Payer: Self-pay | Admitting: Internal Medicine

## 2017-05-15 DIAGNOSIS — F1120 Opioid dependence, uncomplicated: Secondary | ICD-10-CM

## 2017-05-15 DIAGNOSIS — Z23 Encounter for immunization: Secondary | ICD-10-CM

## 2017-05-15 LAB — OPIATES URINE: OPIATES URINE: NEGATIVE

## 2017-05-15 LAB — COCAINE METABOLITES URINE: COCAINE METABOLITES URINE: NEGATIVE

## 2017-05-15 LAB — BUPRENORPHINE SCREEN URINE: BUPRENORPHINE SCREEN URINE: POSITIVE

## 2017-05-15 LAB — ETHANOL URINE: ETHANOL URINE: NEGATIVE

## 2017-05-15 LAB — OXYCODONE SCREEN URINE
OXYCOD SCRN URINE: NEGATIVE
OXYCOD UR SPEC GRAV: 1.01 (ref 1.003–1.035)
OXYCOD URINE CREAT: 150 md/dL
OXYCOD URINE PH: 5.5 (ref 4.5–8.5)

## 2017-05-15 LAB — BENZODIAZEPINES URINE: BENZODIAZEPINES URINE: NEGATIVE

## 2017-05-15 LAB — AMPHETAMINES URINE: AMPHETAMINES URINE: NEGATIVE

## 2017-05-15 LAB — CANNABINOIDS URINE: CANNABINOIDS URINE: POSITIVE — AB

## 2017-05-15 LAB — METHADONE URINE: METHADONE URINE: NEGATIVE

## 2017-05-15 LAB — FENTANYL URINE: FENTANYL URINE: NEGATIVE

## 2017-05-15 NOTE — Progress Notes (Signed)
Pt missed group this week and we left a prescription for her to pick up after she dropped off her urine drug screen.

## 2017-05-15 NOTE — Progress Notes (Signed)
PER Pharmacy,  Cheryl Dixon is a 62 year old female has requested a refill of suboxone       Last prescribed - start date: 05/12/2017 end date: 05/19/2017           Last Office Visit: 05/05/2017  Last Physical Exam: 05/27/2013        Other Med Adult:  Most Recent BP Reading(s)  02/24/17 : Marland Kitchen 138/96        Cholesterol (mg/dl)   Date Value   01/04/2010 264 (H)     LOW DENSITY LIPOPROTEIN DIRECT (mg/dl)   Date Value   01/04/2010 101 (H)     HIGH DENSITY LIPOPROTEIN (mg/dl)   Date Value   01/04/2010 38     No results found for: TG      THYROID SCREEN TSH REFLEX FT4 (uIU/mL)   Date Value   05/27/2013 2.130         No results found for: TSH    HEMOGLOBIN A1C (%)   Date Value   05/27/2013 5.9 (H)       No results found for: POCA1C      INR (no units)   Date Value   02/16/2007 1.0 (L)   07/03/2006 < 1.0 (L)       SODIUM (mmol/L)   Date Value   07/17/2014 140       POTASSIUM (mmol/L)   Date Value   07/17/2014 4.2           CREATININE (mg/dL)   Date Value   07/17/2014 1.0        Documented patient preferred pharmacies:    [place], [place], [place]  Phone: [phone] Fax: [phone]

## 2017-05-15 NOTE — Progress Notes (Signed)
uds collected  [place], [place], 05/15/2017, 12:35 PM

## 2017-05-19 ENCOUNTER — Other Ambulatory Visit (HOSPITAL_BASED_OUTPATIENT_CLINIC_OR_DEPARTMENT_OTHER): Payer: Self-pay | Admitting: Internal Medicine

## 2017-05-19 ENCOUNTER — Ambulatory Visit (HOSPITAL_BASED_OUTPATIENT_CLINIC_OR_DEPARTMENT_OTHER): Payer: Commercial Managed Care - HMO | Admitting: Internal Medicine

## 2017-05-19 DIAGNOSIS — F1120 Opioid dependence, uncomplicated: Secondary | ICD-10-CM

## 2017-05-19 DIAGNOSIS — Z23 Encounter for immunization: Secondary | ICD-10-CM

## 2017-05-19 MED ORDER — BUPRENORPHINE HCL-NALOXONE HCL 8-2 MG SL FILM
2.0000 | ORAL_FILM | Freq: Every day | SUBLINGUAL | 0 refills | Status: DC
Start: 2017-05-19 — End: 2017-05-20

## 2017-05-19 MED ORDER — BUPRENORPHINE HCL-NALOXONE HCL 8-2 MG SL FILM: 2 | Film | Freq: Every day | SUBLINGUAL | 0 refills | 0 days | Status: DC

## 2017-05-20 ENCOUNTER — Telehealth (HOSPITAL_BASED_OUTPATIENT_CLINIC_OR_DEPARTMENT_OTHER): Payer: Self-pay | Admitting: Internal Medicine

## 2017-05-20 DIAGNOSIS — F1120 Opioid dependence, uncomplicated: Secondary | ICD-10-CM

## 2017-05-20 DIAGNOSIS — Z23 Encounter for immunization: Secondary | ICD-10-CM

## 2017-05-20 MED ORDER — BUPRENORPHINE HCL-NALOXONE HCL 8-2 MG SL FILM: 3 | Film | Freq: Every day | SUBLINGUAL | 0 refills | 0 days | Status: DC

## 2017-05-20 MED ORDER — BUPRENORPHINE HCL-NALOXONE HCL 8-2 MG SL FILM
3.0000 | ORAL_FILM | Freq: Every day | SUBLINGUAL | 0 refills | Status: DC
Start: 2017-05-20 — End: 2017-05-26

## 2017-05-20 NOTE — Progress Notes (Signed)
Prior authorization request for buprenorphine/naloxone 8 mg/2 mg film -- three per day  Clinical Indication: opioid dependence in remission  Previous medications tried:   Suboxone brand name film 8 mg/2 mg - 2 per day - adequate response  Buprenorphine/naloxone generic film 8 mg/2 mg - 2 per day -- inadequate response - stomach and leg cramps, regurgitation.  Diagnostic testing information:  Patient was doing well on lower dose of brand name but experience withdrawal symptoms when changed to generic film.  These symptoms resolved with dose increase

## 2017-05-20 NOTE — Progress Notes (Signed)
Patient called about difficulty tolerating generic film, spoke to RN  Film switched from brand name to generic about 2 weeks ago.    Felt "high" on two brand-name films at once in the morning so changed to 1 in the morning and 1 in the afternoon.  - itchy in face, bug-eyed  End of December changed to generic film and about a week later felt  - leg cramps, stomach cramps, regurgitation, poor sleep  - found that she had to take film earlier in the day to get the same effect  Currently taking 3 films per day:  one at 8 am, one in afternoon, and one at 8 pm.  Plan:  Change to 3 per day of generic.  I reinforced with patient:  - do not change dose without talking to me first  - take consistently, same pattern every day  She will also come in for UDS Friday.

## 2017-05-21 NOTE — Progress Notes (Signed)
Hello. Spoke to [place] at [place]. Patient picked up the medication yesterday. All set.

## 2017-05-26 ENCOUNTER — Other Ambulatory Visit (HOSPITAL_BASED_OUTPATIENT_CLINIC_OR_DEPARTMENT_OTHER): Payer: Self-pay | Admitting: Internal Medicine

## 2017-05-26 ENCOUNTER — Ambulatory Visit (HOSPITAL_BASED_OUTPATIENT_CLINIC_OR_DEPARTMENT_OTHER): Payer: Commercial Managed Care - HMO | Admitting: Internal Medicine

## 2017-05-26 DIAGNOSIS — F1120 Opioid dependence, uncomplicated: Secondary | ICD-10-CM

## 2017-05-26 DIAGNOSIS — Z23 Encounter for immunization: Secondary | ICD-10-CM

## 2017-05-26 MED ORDER — BUPRENORPHINE HCL-NALOXONE HCL 8-2 MG SL FILM: 3 | Film | Freq: Every day | SUBLINGUAL | 0 refills | 0 days | Status: DC

## 2017-05-26 MED ORDER — BUPRENORPHINE HCL-NALOXONE HCL 8-2 MG SL FILM
3.0000 | ORAL_FILM | Freq: Every day | SUBLINGUAL | 0 refills | Status: DC
Start: 2017-05-26 — End: 2017-06-02

## 2017-05-26 NOTE — Progress Notes (Signed)
Cheryl Dixon is a 62 year old female seen in follow up for opioid dependence:    Buprenorphine dose: 24 mg  Response, adequacy of dose: good - was having problems with change to generic film but this improved after a change in dose  Relapses/close calls: none  Trigger: n/a  Meetings: no    Social history/events:  As above    Present Medications:    Current Outpatient Medications on File Prior to Visit:  [DISCONTINUED] buprenorphine-naloxone (SUBOXONE) 8-2 MG FILM Place 3 Film under the tongue daily Max Daily Amount: 3 Film for 7 days Disp: 21 Film Rfl: 0   [EXPIRED] umeclidinium (INCRUSE ELLIPTA) 62.5 MCG/INH inhaler Inhale 1 puff into the lungs daily Disp: 1 Inhaler Rfl: 2   albuterol (PROVENTIL HFA,VENTOLIN HFA, PROAIR HFA) 108 (90 Base) MCG/ACT inhaler Inhale 2 puffs into the lungs every 6 (six) hours as needed for Wheezing or Shortness of breath Disp: 1 Inhaler Rfl: 2   naproxen (NAPROSYN) 500 MG tablet Take 1 tablet by mouth 2 (two) times daily with meals for 15 days Disp: 30 tablet Rfl: 0   docusate sodium (COLACE) 100 MG capsule Take 1 capsule by mouth 2 (two) times daily Disp: 60 capsule Rfl: 11   buPROPion (WELLBUTRIN SR) 150 MG 12 hr tablet Take 1 tablet by mouth 2 (two) times daily Disp: 60 tablet Rfl: 2   ipratropium-albuterol (DUO-NEB) 0.5-2.5 (3) MG/3ML SOLN Inhalation Solution Take 3 mLs by nebulization 4 (four) times daily. Disp: 180 mL Rfl: 0     No current facility-administered medications on file prior to visit.     Review of Systems:  Neurological exam: negative  PHQ-9 TOTAL SCORE 03/03/2017 01/05/2017 11/27/2016   Doc FlowSheet Total Score - - -   Doc FlowSheet Total Score 4 22 18            PHYSICAL EXAMINATION:  General appearance - healthy female in no distress  Eyes - pupils 3 mm  Skin - warm and dry  Neuro - nonfocal  Affect - normal    PMP reviewed.  No unauthorized prescriptions since last refill.    Assessment:  Opioid Dependence  Comment: condition gradually improving on current dose,  actively participating in group.  Plan:  Continue buprenorphine, reviewed criteria for tapering when patient is ready.   Patient is counseled regarding relapse prevention, involvement in recovery groups, potential side effects of buprenorphine, and eventual taper of medication.    We discussed the patients current medications. The patient expressed understanding and no barriers to adherence were identified.  1. The patient indicates understanding of these issues and agrees with the plan.  Brief care plan is updated and reviewed with the patient.   2. The patient is given an After Visit Summary sheet that lists all medications with directions, allergies, orders placed during this encounter, and follow-up instructions.   3. I reviewed the patient's medical information and medical history   4. I reconciled the patient's medication list and prepared and supplied needed refills.   5. I have reviewed the past medical, family, and social history sections including the medications and allergies.    [name], MD

## 2017-05-27 LAB — ETHANOL URINE: ETHANOL URINE: NEGATIVE

## 2017-05-27 LAB — AMPHETAMINES URINE: AMPHETAMINES URINE: NEGATIVE

## 2017-05-27 LAB — OXYCODONE SCREEN URINE
OXYCOD SCRN URINE: NEGATIVE
OXYCOD UR SPEC GRAV: 1.03 (ref 1.003–1.035)
OXYCOD URINE CREAT: 269 md/dL
OXYCOD URINE PH: 5.5 (ref 4.5–8.5)

## 2017-05-27 LAB — CANNABINOIDS URINE: CANNABINOIDS URINE: POSITIVE — AB

## 2017-05-27 LAB — METHADONE URINE: METHADONE URINE: NEGATIVE

## 2017-05-27 LAB — OPIATES URINE: OPIATES URINE: NEGATIVE

## 2017-05-27 LAB — COCAINE METABOLITES URINE: COCAINE METABOLITES URINE: NEGATIVE

## 2017-05-27 LAB — BENZODIAZEPINES URINE: BENZODIAZEPINES URINE: NEGATIVE

## 2017-05-27 LAB — FENTANYL URINE: FENTANYL URINE: NEGATIVE

## 2017-05-27 LAB — BUPRENORPHINE SCREEN URINE: BUPRENORPHINE SCREEN URINE: POSITIVE

## 2017-06-01 ENCOUNTER — Other Ambulatory Visit (HOSPITAL_BASED_OUTPATIENT_CLINIC_OR_DEPARTMENT_OTHER): Payer: Self-pay | Admitting: Internal Medicine

## 2017-06-01 DIAGNOSIS — F1120 Opioid dependence, uncomplicated: Secondary | ICD-10-CM

## 2017-06-01 NOTE — Progress Notes (Signed)
PER Pharmacy, Cheryl Dixon is a 62 year old female has requested a refill of Incruse.      Last Office Visit: 05/26/17 with Dr. Merryl Hacker  Last Physical Exam: 1//30/15      Other Med Adult:  Most Recent BP Reading(s)  02/24/17 : Marland Kitchen 138/96        Cholesterol (mg/dl)   Date Value   01/04/2010 264 (H)     LOW DENSITY LIPOPROTEIN DIRECT (mg/dl)   Date Value   01/04/2010 101 (H)     HIGH DENSITY LIPOPROTEIN (mg/dl)   Date Value   01/04/2010 38     No results found for: TG      THYROID SCREEN TSH REFLEX FT4 (uIU/mL)   Date Value   05/27/2013 2.130         No results found for: TSH    HEMOGLOBIN A1C (%)   Date Value   05/27/2013 5.9 (H)       No results found for: POCA1C      INR (no units)   Date Value   02/16/2007 1.0 (L)   07/03/2006 < 1.0 (L)       SODIUM (mmol/L)   Date Value   07/17/2014 140       POTASSIUM (mmol/L)   Date Value   07/17/2014 4.2           CREATININE (mg/dL)   Date Value   07/17/2014 1.0       Documented patient preferred pharmacies:    [facility], [place], [place]  Phone: [phone] Fax: [phone]

## 2017-06-02 ENCOUNTER — Other Ambulatory Visit (HOSPITAL_BASED_OUTPATIENT_CLINIC_OR_DEPARTMENT_OTHER): Payer: Self-pay | Admitting: Internal Medicine

## 2017-06-02 ENCOUNTER — Ambulatory Visit (HOSPITAL_BASED_OUTPATIENT_CLINIC_OR_DEPARTMENT_OTHER): Payer: Commercial Managed Care - HMO | Admitting: Internal Medicine

## 2017-06-02 DIAGNOSIS — F1120 Opioid dependence, uncomplicated: Secondary | ICD-10-CM

## 2017-06-02 DIAGNOSIS — Z23 Encounter for immunization: Secondary | ICD-10-CM

## 2017-06-02 MED ORDER — BUPRENORPHINE HCL-NALOXONE HCL 8-2 MG SL FILM: 3 | Film | Freq: Every day | SUBLINGUAL | 0 refills | 0 days | Status: AC

## 2017-06-02 MED ORDER — BUPRENORPHINE HCL-NALOXONE HCL 8-2 MG SL FILM: 3.0000 | ORAL_FILM | Freq: Every day | SUBLINGUAL | 0 refills | Status: DC

## 2017-06-02 MED ORDER — BUPRENORPHINE HCL-NALOXONE HCL 8-2 MG SL FILM
3.0000 | ORAL_FILM | Freq: Every day | SUBLINGUAL | 0 refills | Status: DC
Start: 2017-06-16 — End: 2017-06-23

## 2017-06-02 MED ORDER — BUPRENORPHINE HCL-NALOXONE HCL 8-2 MG SL FILM: 3 | Film | Freq: Every day | SUBLINGUAL | 0 refills | 0 days | Status: DC

## 2017-06-02 NOTE — Progress Notes (Deleted)
Left without being seen.

## 2017-06-02 NOTE — Addendum Note (Signed)
Addended by: Nena Hampe on: 06/02/2017 08:24 PM     Modules accepted: Orders

## 2017-06-03 ENCOUNTER — Ambulatory Visit (HOSPITAL_BASED_OUTPATIENT_CLINIC_OR_DEPARTMENT_OTHER): Payer: Commercial Managed Care - HMO

## 2017-06-03 DIAGNOSIS — F1120 Opioid dependence, uncomplicated: Secondary | ICD-10-CM

## 2017-06-03 LAB — OPIATES URINE: OPIATES URINE: NEGATIVE

## 2017-06-03 LAB — OXYCODONE SCREEN URINE
OXYCOD SCRN URINE: NEGATIVE
OXYCOD UR SPEC GRAV: 1.01 (ref 1.003–1.035)
OXYCOD URINE CREAT: 94 md/dL
OXYCOD URINE PH: 7 (ref 4.5–8.5)

## 2017-06-03 LAB — AMPHETAMINES URINE: AMPHETAMINES URINE: NEGATIVE

## 2017-06-03 LAB — CANNABINOIDS URINE: CANNABINOIDS URINE: POSITIVE — AB

## 2017-06-03 LAB — FENTANYL URINE: FENTANYL URINE: NEGATIVE

## 2017-06-03 LAB — COCAINE METABOLITES URINE: COCAINE METABOLITES URINE: NEGATIVE

## 2017-06-03 LAB — BUPRENORPHINE SCREEN URINE: BUPRENORPHINE SCREEN URINE: POSITIVE

## 2017-06-03 LAB — ETHANOL URINE: ETHANOL URINE: NEGATIVE

## 2017-06-03 LAB — BENZODIAZEPINES URINE: BENZODIAZEPINES URINE: NEGATIVE

## 2017-06-03 LAB — METHADONE URINE: METHADONE URINE: NEGATIVE

## 2017-06-03 NOTE — Progress Notes (Signed)
urine collected  Cheryl Dixon, 06/03/2017, 1:14 PM

## 2017-06-09 ENCOUNTER — Ambulatory Visit (HOSPITAL_BASED_OUTPATIENT_CLINIC_OR_DEPARTMENT_OTHER): Payer: Commercial Managed Care - HMO | Admitting: Internal Medicine

## 2017-06-10 ENCOUNTER — Other Ambulatory Visit (HOSPITAL_BASED_OUTPATIENT_CLINIC_OR_DEPARTMENT_OTHER): Payer: Self-pay | Admitting: Internal Medicine

## 2017-06-10 NOTE — Progress Notes (Signed)
PER Patient (self), Cheryl Dixon is a 62 year old female has requested a refill of nicotine 14 mg patches    Last Office Visit: 05-26-17 with pcp   Last Physical Exam: 05-27-13    Other Med Adult:  Most Recent BP Reading(s)  02/24/17 : (!) 138/96        Cholesterol (mg/dl)   Date Value   01/04/2010 264 (H)     LOW DENSITY LIPOPROTEIN DIRECT (mg/dl)   Date Value   01/04/2010 101 (H)     HIGH DENSITY LIPOPROTEIN (mg/dl)   Date Value   01/04/2010 38     No results found for: TG      THYROID SCREEN TSH REFLEX FT4 (uIU/mL)   Date Value   05/27/2013 2.130         No results found for: TSH    HEMOGLOBIN A1C (%)   Date Value   05/27/2013 5.9 (H)       No results found for: POCA1C      INR (no units)   Date Value   02/16/2007 1.0 (L)   07/03/2006 < 1.0 (L)       SODIUM (mmol/L)   Date Value   07/17/2014 140       POTASSIUM (mmol/L)   Date Value   07/17/2014 4.2           CREATININE (mg/dL)   Date Value   07/17/2014 1.0       Documented patient preferred pharmacies:    [facility], [place], [place]  Phone: [phone] Fax: [phone]

## 2017-06-16 ENCOUNTER — Telehealth (HOSPITAL_BASED_OUTPATIENT_CLINIC_OR_DEPARTMENT_OTHER): Payer: Self-pay | Admitting: Registered Nurse

## 2017-06-16 ENCOUNTER — Ambulatory Visit (HOSPITAL_BASED_OUTPATIENT_CLINIC_OR_DEPARTMENT_OTHER): Payer: Commercial Managed Care - HMO | Admitting: Registered Nurse

## 2017-06-16 NOTE — Progress Notes (Signed)
Spoke to Cheryl Dixon in regards to her call that she would not be able to attend group today.  She states people are there working on her house installing windows and she is unable to leave.  We discussed missed meetings and the need for her to attend weekly and on time going forward.  She voiced understanding.  Her prescription will be left at the desk with the understanding that she provide a urine sample.

## 2017-06-17 ENCOUNTER — Ambulatory Visit (HOSPITAL_BASED_OUTPATIENT_CLINIC_OR_DEPARTMENT_OTHER): Payer: Commercial Managed Care - HMO

## 2017-06-17 DIAGNOSIS — F1120 Opioid dependence, uncomplicated: Secondary | ICD-10-CM

## 2017-06-17 LAB — METHADONE URINE: METHADONE URINE: NEGATIVE

## 2017-06-17 LAB — AMPHETAMINES URINE: AMPHETAMINES URINE: NEGATIVE

## 2017-06-17 LAB — COCAINE METABOLITES URINE: COCAINE METABOLITES URINE: NEGATIVE

## 2017-06-17 LAB — OXYCODONE SCREEN URINE
OXYCOD SCRN URINE: NEGATIVE
OXYCOD UR SPEC GRAV: 1.001 — CL (ref 1.003–1.035)
OXYCOD URINE CREAT: 42 md/dL
OXYCOD URINE PH: 6 (ref 4.5–8.5)

## 2017-06-17 LAB — CANNABINOIDS URINE: CANNABINOIDS URINE: POSITIVE — AB

## 2017-06-17 LAB — ETHANOL URINE: ETHANOL URINE: NEGATIVE

## 2017-06-17 LAB — OPIATES URINE: OPIATES URINE: NEGATIVE

## 2017-06-17 LAB — BENZODIAZEPINES URINE: BENZODIAZEPINES URINE: NEGATIVE

## 2017-06-17 LAB — BUPRENORPHINE SCREEN URINE: BUPRENORPHINE SCREEN URINE: POSITIVE

## 2017-06-17 LAB — FENTANYL URINE: FENTANYL URINE: NEGATIVE

## 2017-06-17 NOTE — Progress Notes (Signed)
Urine specimen collected and sent to lab- very limited amount of specimen- pt unable to provide more  [facility], 06/17/2017, 1:13 PM'

## 2017-06-22 ENCOUNTER — Other Ambulatory Visit: Payer: Self-pay | Admitting: Rheumatology

## 2017-06-22 NOTE — Telephone Encounter (Signed)
Last Visit: 02/20/17 Next Visit: 08/27/17  Okay to refill per Dr. Estanislado Pandy

## 2017-06-23 ENCOUNTER — Ambulatory Visit (HOSPITAL_BASED_OUTPATIENT_CLINIC_OR_DEPARTMENT_OTHER): Payer: Commercial Managed Care - HMO | Admitting: Internal Medicine

## 2017-06-23 ENCOUNTER — Other Ambulatory Visit (HOSPITAL_BASED_OUTPATIENT_CLINIC_OR_DEPARTMENT_OTHER): Payer: Self-pay | Admitting: Internal Medicine

## 2017-06-23 DIAGNOSIS — F1120 Opioid dependence, uncomplicated: Secondary | ICD-10-CM

## 2017-06-23 DIAGNOSIS — Z23 Encounter for immunization: Secondary | ICD-10-CM

## 2017-06-23 MED ORDER — BUPRENORPHINE HCL-NALOXONE HCL 8-2 MG SL FILM
3.0000 | ORAL_FILM | Freq: Every day | SUBLINGUAL | 0 refills | Status: DC
Start: 2017-06-23 — End: 2017-06-30

## 2017-06-23 MED ORDER — BUPRENORPHINE HCL-NALOXONE HCL 8-2 MG SL FILM: 3 | Film | Freq: Every day | SUBLINGUAL | 0 refills | 0 days | Status: DC

## 2017-06-23 NOTE — Progress Notes (Signed)
Cheryl Dixon is a 62 year old female seen in follow up for opioid dependence:    Buprenorphine dose: 24 mg  Response, adequacy of dose: good  Relapses/close calls: none  Trigger: n/a  Meetings: no    Social history/events:  As above    Present Medications:    Current Outpatient Medications on File Prior to Visit:  nicotine (NICODERM CQ) 14 MG/24HR place 1 PATCH TOPICALLY DAILY Disp: 28 patch Rfl: 0   [DISCONTINUED] buprenorphine-naloxone (SUBOXONE) 8-2 MG FILM Place 3 Film under the tongue daily Max Daily Amount: 3 Film for 7 days Disp: 21 Film Rfl: 0   INCRUSE ELLIPTA 62.5 MCG/INH inhaler INHALE 1 PUFF BY MOUTH INTO LUNGS DAILY Disp: 1 Inhaler Rfl: 11   albuterol (PROVENTIL HFA,VENTOLIN HFA, PROAIR HFA) 108 (90 Base) MCG/ACT inhaler Inhale 2 puffs into the lungs every 6 (six) hours as needed for Wheezing or Shortness of breath Disp: 1 Inhaler Rfl: 2   naproxen (NAPROSYN) 500 MG tablet Take 1 tablet by mouth 2 (two) times daily with meals for 15 days Disp: 30 tablet Rfl: 0   docusate sodium (COLACE) 100 MG capsule Take 1 capsule by mouth 2 (two) times daily Disp: 60 capsule Rfl: 11   buPROPion (WELLBUTRIN SR) 150 MG 12 hr tablet Take 1 tablet by mouth 2 (two) times daily Disp: 60 tablet Rfl: 2   ipratropium-albuterol (DUO-NEB) 0.5-2.5 (3) MG/3ML SOLN Inhalation Solution Take 3 mLs by nebulization 4 (four) times daily. Disp: 180 mL Rfl: 0     No current facility-administered medications on file prior to visit.     Review of Systems:  Neurological exam: negative  PHQ-9 TOTAL SCORE 03/03/2017 01/05/2017 11/27/2016   Doc FlowSheet Total Score - - -   Doc FlowSheet Total Score 4 22 18            PHYSICAL EXAMINATION:  General appearance - healthy female in no distress  Eyes - pupils 3 mm  Skin - warm and dry  Neuro - nonfocal  Affect - normal    PMP reviewed.  No unauthorized prescriptions since last refill.    Assessment:  Opioid Dependence  Comment: condition gradually improving on current dose, actively participating in  group.  Plan:  Continue buprenorphine, reviewed criteria for tapering when patient is ready.   Patient is counseled regarding relapse prevention, involvement in recovery groups, potential side effects of buprenorphine, and eventual taper of medication.    We discussed the patients current medications. The patient expressed understanding and no barriers to adherence were identified.  1. The patient indicates understanding of these issues and agrees with the plan.  Brief care plan is updated and reviewed with the patient.   2. The patient is given an After Visit Summary sheet that lists all medications with directions, allergies, orders placed during this encounter, and follow-up instructions.   3. I reviewed the patient's medical information and medical history   4. I reconciled the patient's medication list and prepared and supplied needed refills.   5. I have reviewed the past medical, family, and social history sections including the medications and allergies.    [name], MD

## 2017-06-24 LAB — FENTANYL URINE: FENTANYL URINE: NEGATIVE

## 2017-06-24 LAB — OXYCODONE SCREEN URINE
OXYCOD SCRN URINE: NEGATIVE
OXYCOD UR SPEC GRAV: 1.01 (ref 1.003–1.035)
OXYCOD URINE CREAT: 190 md/dL
OXYCOD URINE PH: 6 (ref 4.5–8.5)

## 2017-06-24 LAB — BENZODIAZEPINES URINE: BENZODIAZEPINES URINE: NEGATIVE

## 2017-06-24 LAB — BUPRENORPHINE SCREEN URINE: BUPRENORPHINE SCREEN URINE: POSITIVE

## 2017-06-24 LAB — COCAINE METABOLITES URINE: COCAINE METABOLITES URINE: NEGATIVE

## 2017-06-24 LAB — AMPHETAMINES URINE: AMPHETAMINES URINE: NEGATIVE

## 2017-06-24 LAB — METHADONE URINE: METHADONE URINE: NEGATIVE

## 2017-06-24 LAB — CANNABINOIDS URINE: CANNABINOIDS URINE: POSITIVE — AB

## 2017-06-24 LAB — OPIATES URINE: OPIATES URINE: NEGATIVE

## 2017-06-24 LAB — ETHANOL URINE: ETHANOL URINE: NEGATIVE

## 2017-06-30 ENCOUNTER — Ambulatory Visit (HOSPITAL_BASED_OUTPATIENT_CLINIC_OR_DEPARTMENT_OTHER): Payer: Commercial Managed Care - HMO | Admitting: Internal Medicine

## 2017-06-30 ENCOUNTER — Encounter (HOSPITAL_BASED_OUTPATIENT_CLINIC_OR_DEPARTMENT_OTHER): Payer: Self-pay | Admitting: Internal Medicine

## 2017-06-30 ENCOUNTER — Other Ambulatory Visit (HOSPITAL_BASED_OUTPATIENT_CLINIC_OR_DEPARTMENT_OTHER): Payer: Self-pay | Admitting: Internal Medicine

## 2017-06-30 DIAGNOSIS — F1120 Opioid dependence, uncomplicated: Secondary | ICD-10-CM

## 2017-06-30 DIAGNOSIS — Z23 Encounter for immunization: Secondary | ICD-10-CM

## 2017-06-30 LAB — COCAINE METABOLITES URINE: COCAINE METABOLITES URINE: NEGATIVE

## 2017-06-30 LAB — ETHANOL URINE: ETHANOL URINE: NEGATIVE

## 2017-06-30 LAB — BENZODIAZEPINES URINE: BENZODIAZEPINES URINE: NEGATIVE

## 2017-06-30 LAB — OXYCODONE SCREEN URINE
OXYCOD SCRN URINE: NEGATIVE
OXYCOD UR SPEC GRAV: 1.01 (ref 1.003–1.035)
OXYCOD URINE CREAT: 66 md/dL
OXYCOD URINE PH: 6.5 (ref 4.5–8.5)

## 2017-06-30 LAB — BUPRENORPHINE SCREEN URINE: BUPRENORPHINE SCREEN URINE: POSITIVE

## 2017-06-30 LAB — OPIATES URINE: OPIATES URINE: NEGATIVE

## 2017-06-30 MED ORDER — BUPRENORPHINE HCL-NALOXONE HCL 8-2 MG SL FILM
3.0000 | ORAL_FILM | Freq: Every day | SUBLINGUAL | 0 refills | Status: DC
Start: 2017-07-07 — End: 2017-07-14

## 2017-06-30 MED ORDER — BUPRENORPHINE HCL-NALOXONE HCL 8-2 MG SL FILM: 3 | Film | Freq: Every day | SUBLINGUAL | 0 refills | 0 days | Status: AC

## 2017-06-30 MED ORDER — BUPRENORPHINE HCL-NALOXONE HCL 8-2 MG SL FILM: 3.0000 | ORAL_FILM | Freq: Every day | SUBLINGUAL | 0 refills | Status: DC

## 2017-06-30 MED ORDER — BUPRENORPHINE HCL-NALOXONE HCL 8-2 MG SL FILM: 3 | Film | Freq: Every day | SUBLINGUAL | 0 refills | 0 days | Status: DC

## 2017-06-30 NOTE — Addendum Note (Signed)
Addended by: Deniyah Dillavou on: 06/30/2017 07:10 PM     Modules accepted: Orders

## 2017-06-30 NOTE — Progress Notes (Signed)
Cheryl Dixon is a 62 year old female seen in follow up for opioid dependence:    Buprenorphine dose: 24 mg  Response, adequacy of dose: good  Relapses/close calls: none  Trigger: n/a  Meetings: no    Social history/events:  Had a big birthday party thrown by her sister    Present Medications:    Current Outpatient Medications on File Prior to Visit:  [DISCONTINUED] buprenorphine-naloxone (SUBOXONE) 8-2 MG FILM Place 3 Film under the tongue daily Max Daily Amount: 3 Film for 7 days Disp: 21 Film Rfl: 0   nicotine (NICODERM CQ) 14 MG/24HR place 1 PATCH TOPICALLY DAILY Disp: 28 patch Rfl: 0   INCRUSE ELLIPTA 62.5 MCG/INH inhaler INHALE 1 PUFF BY MOUTH INTO LUNGS DAILY Disp: 1 Inhaler Rfl: 11   albuterol (PROVENTIL HFA,VENTOLIN HFA, PROAIR HFA) 108 (90 Base) MCG/ACT inhaler Inhale 2 puffs into the lungs every 6 (six) hours as needed for Wheezing or Shortness of breath Disp: 1 Inhaler Rfl: 2   naproxen (NAPROSYN) 500 MG tablet Take 1 tablet by mouth 2 (two) times daily with meals for 15 days Disp: 30 tablet Rfl: 0   docusate sodium (COLACE) 100 MG capsule Take 1 capsule by mouth 2 (two) times daily Disp: 60 capsule Rfl: 11   buPROPion (WELLBUTRIN SR) 150 MG 12 hr tablet Take 1 tablet by mouth 2 (two) times daily Disp: 60 tablet Rfl: 2   ipratropium-albuterol (DUO-NEB) 0.5-2.5 (3) MG/3ML SOLN Inhalation Solution Take 3 mLs by nebulization 4 (four) times daily. Disp: 180 mL Rfl: 0     No current facility-administered medications on file prior to visit.     Review of Systems:  Neurological exam: negative  PHQ-9 TOTAL SCORE 03/03/2017 01/05/2017 11/27/2016   Doc FlowSheet Total Score - - -   Doc FlowSheet Total Score 4 22 18            PHYSICAL EXAMINATION:  General appearance - healthy female in no distress  Eyes - pupils 3 mm  Skin - warm and dry  Neuro - nonfocal  Affect - normal    PMP reviewed.  No unauthorized prescriptions since last refill.    Assessment:  Opioid Dependence  Comment: condition gradually improving on  current dose, actively participating in group.  Plan:  Continue buprenorphine, reviewed criteria for tapering when patient is ready.   Patient is counseled regarding relapse prevention, involvement in recovery groups, potential side effects of buprenorphine, and eventual taper of medication.    We discussed the patients current medications. The patient expressed understanding and no barriers to adherence were identified.  1. The patient indicates understanding of these issues and agrees with the plan.  Brief care plan is updated and reviewed with the patient.   2. The patient is given an After Visit Summary sheet that lists all medications with directions, allergies, orders placed during this encounter, and follow-up instructions.   3. I reviewed the patient's medical information and medical history   4. I reconciled the patient's medication list and prepared and supplied needed refills.   5. I have reviewed the past medical, family, and social history sections including the medications and allergies.    [name], MD

## 2017-07-07 ENCOUNTER — Ambulatory Visit (HOSPITAL_BASED_OUTPATIENT_CLINIC_OR_DEPARTMENT_OTHER): Payer: Commercial Managed Care - HMO | Admitting: Family Medicine

## 2017-07-09 ENCOUNTER — Ambulatory Visit (HOSPITAL_BASED_OUTPATIENT_CLINIC_OR_DEPARTMENT_OTHER): Payer: Commercial Managed Care - HMO

## 2017-07-09 DIAGNOSIS — F1120 Opioid dependence, uncomplicated: Secondary | ICD-10-CM

## 2017-07-09 LAB — CANNABINOIDS URINE: CANNABINOIDS URINE: POSITIVE — AB

## 2017-07-09 LAB — BUPRENORPHINE SCREEN URINE: BUPRENORPHINE SCREEN URINE: POSITIVE

## 2017-07-09 LAB — ETHANOL URINE: ETHANOL URINE: NEGATIVE

## 2017-07-09 LAB — OPIATES URINE: OPIATES URINE: NEGATIVE

## 2017-07-09 LAB — BENZODIAZEPINES URINE: BENZODIAZEPINES URINE: NEGATIVE

## 2017-07-09 LAB — AMPHETAMINES URINE: AMPHETAMINES URINE: NEGATIVE

## 2017-07-09 LAB — FENTANYL URINE: FENTANYL URINE: NEGATIVE

## 2017-07-09 LAB — OXYCODONE SCREEN URINE
OXYCOD SCRN URINE: NEGATIVE
OXYCOD UR SPEC GRAV: 1.02 (ref 1.003–1.035)
OXYCOD URINE CREAT: 173 md/dL
OXYCOD URINE PH: 5.5 (ref 4.5–8.5)

## 2017-07-09 LAB — METHADONE URINE: METHADONE URINE: NEGATIVE

## 2017-07-09 LAB — COCAINE METABOLITES URINE: COCAINE METABOLITES URINE: NEGATIVE

## 2017-07-09 NOTE — Progress Notes (Signed)
Specimen collected and sent out  [facility], 07/09/2017, 9:17 AM

## 2017-07-14 ENCOUNTER — Encounter (HOSPITAL_BASED_OUTPATIENT_CLINIC_OR_DEPARTMENT_OTHER): Payer: Self-pay | Admitting: Internal Medicine

## 2017-07-14 ENCOUNTER — Other Ambulatory Visit (HOSPITAL_BASED_OUTPATIENT_CLINIC_OR_DEPARTMENT_OTHER): Payer: Self-pay | Admitting: Internal Medicine

## 2017-07-14 ENCOUNTER — Ambulatory Visit (HOSPITAL_BASED_OUTPATIENT_CLINIC_OR_DEPARTMENT_OTHER): Payer: Commercial Managed Care - HMO | Admitting: Internal Medicine

## 2017-07-14 DIAGNOSIS — F1120 Opioid dependence, uncomplicated: Secondary | ICD-10-CM

## 2017-07-14 DIAGNOSIS — Z23 Encounter for immunization: Secondary | ICD-10-CM

## 2017-07-14 LAB — CANNABINOIDS URINE: CANNABINOIDS URINE: POSITIVE — AB

## 2017-07-14 LAB — OPIATES URINE: OPIATES URINE: NEGATIVE

## 2017-07-14 LAB — COCAINE METABOLITES URINE: COCAINE METABOLITES URINE: NEGATIVE

## 2017-07-14 LAB — ETHANOL URINE: ETHANOL URINE: NEGATIVE

## 2017-07-14 LAB — BUPRENORPHINE SCREEN URINE: BUPRENORPHINE SCREEN URINE: POSITIVE

## 2017-07-14 LAB — AMPHETAMINES URINE: AMPHETAMINES URINE: NEGATIVE

## 2017-07-14 LAB — OXYCODONE SCREEN URINE
OXYCOD SCRN URINE: NEGATIVE
OXYCOD UR SPEC GRAV: 1.015 (ref 1.003–1.035)
OXYCOD URINE CREAT: 149 md/dL
OXYCOD URINE PH: 5.5 (ref 4.5–8.5)

## 2017-07-14 LAB — BENZODIAZEPINES URINE: BENZODIAZEPINES URINE: NEGATIVE

## 2017-07-14 MED ORDER — BUPRENORPHINE HCL-NALOXONE HCL 8-2 MG SL FILM: 3.0000 | ORAL_FILM | Freq: Every day | SUBLINGUAL | 0 refills | Status: DC

## 2017-07-14 MED ORDER — BUPRENORPHINE HCL-NALOXONE HCL 8-2 MG SL FILM: 3 | Film | Freq: Every day | SUBLINGUAL | 0 refills | 0 days | Status: AC

## 2017-07-14 MED ORDER — BUPRENORPHINE HCL-NALOXONE HCL 8-2 MG SL FILM
3.0000 | ORAL_FILM | Freq: Every day | SUBLINGUAL | 0 refills | Status: DC
Start: 2017-07-21 — End: 2017-07-27

## 2017-07-14 MED ORDER — BUPRENORPHINE HCL-NALOXONE HCL 8-2 MG SL FILM: 3 | Film | Freq: Every day | SUBLINGUAL | 0 refills | 0 days | Status: DC

## 2017-07-14 NOTE — Progress Notes (Signed)
Cheryl Dixon is a 62 year old female seen in follow up for opioid dependence:    Buprenorphine dose: 24 mg  Response, adequacy of dose: good  Relapses/close calls: non  Trigger: n/a  Meetings: no    Social history/events:  Had birthday party for her granddaughter  Participated in group exercise on finding positive experiences in your life.     Present Medications:    Current Outpatient Medications on File Prior to Visit:  [DISCONTINUED] buprenorphine-naloxone (SUBOXONE) 8-2 MG FILM Place 3 Film under the tongue daily Max Daily Amount: 3 Film for 7 days Disp: 21 Film Rfl: 0   INCRUSE ELLIPTA 62.5 MCG/INH inhaler INHALE 1 PUFF BY MOUTH INTO LUNGS DAILY Disp: 1 Inhaler Rfl: 11   albuterol (PROVENTIL HFA,VENTOLIN HFA, PROAIR HFA) 108 (90 Base) MCG/ACT inhaler Inhale 2 puffs into the lungs every 6 (six) hours as needed for Wheezing or Shortness of breath Disp: 1 Inhaler Rfl: 2   naproxen (NAPROSYN) 500 MG tablet Take 1 tablet by mouth 2 (two) times daily with meals for 15 days Disp: 30 tablet Rfl: 0   docusate sodium (COLACE) 100 MG capsule Take 1 capsule by mouth 2 (two) times daily Disp: 60 capsule Rfl: 11   buPROPion (WELLBUTRIN SR) 150 MG 12 hr tablet Take 1 tablet by mouth 2 (two) times daily Disp: 60 tablet Rfl: 2   ipratropium-albuterol (DUO-NEB) 0.5-2.5 (3) MG/3ML SOLN Inhalation Solution Take 3 mLs by nebulization 4 (four) times daily. Disp: 180 mL Rfl: 0     No current facility-administered medications on file prior to visit.     Review of Systems:  Neurological exam: negative  PHQ-9 TOTAL SCORE 03/03/2017 01/05/2017 11/27/2016   Doc FlowSheet Total Score - - -   Doc FlowSheet Total Score 4 22 18            PHYSICAL EXAMINATION:  General appearance - healthy female in no distress  Eyes - pupils 3 mm  Skin - warm and dry  Neuro - nonfocal  Affect - normal    PMP reviewed.  No unauthorized prescriptions since last refill.    Assessment:  Opioid Dependence  Comment: condition gradually improving on current dose,  actively participating in group.  Plan:  Continue buprenorphine, reviewed criteria for tapering when patient is ready.   Patient is counseled regarding relapse prevention, involvement in recovery groups, potential side effects of buprenorphine, and eventual taper of medication.    We discussed the patients current medications. The patient expressed understanding and no barriers to adherence were identified.  1. The patient indicates understanding of these issues and agrees with the plan.  Brief care plan is updated and reviewed with the patient.   2. The patient is given an After Visit Summary sheet that lists all medications with directions, allergies, orders placed during this encounter, and follow-up instructions.   3. I reviewed the patient's medical information and medical history   4. I reconciled the patient's medication list and prepared and supplied needed refills.   5. I have reviewed the past medical, family, and social history sections including the medications and allergies.    [name], MD

## 2017-07-20 DIAGNOSIS — E039 Hypothyroidism, unspecified: Secondary | ICD-10-CM | POA: Diagnosis not present

## 2017-07-20 DIAGNOSIS — H02889 Meibomian gland dysfunction of unspecified eye, unspecified eyelid: Secondary | ICD-10-CM | POA: Diagnosis not present

## 2017-07-20 DIAGNOSIS — R799 Abnormal finding of blood chemistry, unspecified: Secondary | ICD-10-CM | POA: Diagnosis not present

## 2017-07-20 DIAGNOSIS — R5382 Chronic fatigue, unspecified: Secondary | ICD-10-CM | POA: Diagnosis not present

## 2017-07-20 DIAGNOSIS — E569 Vitamin deficiency, unspecified: Secondary | ICD-10-CM | POA: Diagnosis not present

## 2017-07-20 DIAGNOSIS — D510 Vitamin B12 deficiency anemia due to intrinsic factor deficiency: Secondary | ICD-10-CM | POA: Diagnosis not present

## 2017-07-21 ENCOUNTER — Encounter (HOSPITAL_BASED_OUTPATIENT_CLINIC_OR_DEPARTMENT_OTHER): Payer: Self-pay

## 2017-07-21 ENCOUNTER — Ambulatory Visit (HOSPITAL_BASED_OUTPATIENT_CLINIC_OR_DEPARTMENT_OTHER): Payer: Commercial Managed Care - HMO | Admitting: Internal Medicine

## 2017-07-21 NOTE — Progress Notes (Signed)
Patient script is at front desk .Patient did not attend group she must leave urine before receiving script

## 2017-07-23 DIAGNOSIS — K219 Gastro-esophageal reflux disease without esophagitis: Secondary | ICD-10-CM | POA: Diagnosis not present

## 2017-07-23 DIAGNOSIS — I10 Essential (primary) hypertension: Secondary | ICD-10-CM | POA: Diagnosis not present

## 2017-07-23 DIAGNOSIS — G4700 Insomnia, unspecified: Secondary | ICD-10-CM | POA: Diagnosis not present

## 2017-07-27 ENCOUNTER — Other Ambulatory Visit (HOSPITAL_BASED_OUTPATIENT_CLINIC_OR_DEPARTMENT_OTHER): Payer: Self-pay | Admitting: Internal Medicine

## 2017-07-27 DIAGNOSIS — Z23 Encounter for immunization: Secondary | ICD-10-CM

## 2017-07-27 DIAGNOSIS — F1120 Opioid dependence, uncomplicated: Secondary | ICD-10-CM

## 2017-07-27 MED ORDER — BUPRENORPHINE HCL-NALOXONE HCL 8-2 MG SL FILM: 3 | Film | Freq: Every day | SUBLINGUAL | 0 refills | 0 days | Status: DC

## 2017-07-27 MED ORDER — BUPRENORPHINE HCL-NALOXONE HCL 8-2 MG SL FILM
3.0000 | ORAL_FILM | Freq: Every day | SUBLINGUAL | 0 refills | Status: DC
Start: 2017-07-28 — End: 2017-08-04

## 2017-07-28 ENCOUNTER — Ambulatory Visit (HOSPITAL_BASED_OUTPATIENT_CLINIC_OR_DEPARTMENT_OTHER): Payer: Commercial Managed Care - HMO | Admitting: Internal Medicine

## 2017-08-04 ENCOUNTER — Other Ambulatory Visit (HOSPITAL_BASED_OUTPATIENT_CLINIC_OR_DEPARTMENT_OTHER): Payer: Self-pay | Admitting: Internal Medicine

## 2017-08-04 ENCOUNTER — Ambulatory Visit (HOSPITAL_BASED_OUTPATIENT_CLINIC_OR_DEPARTMENT_OTHER): Payer: Commercial Managed Care - HMO | Admitting: Internal Medicine

## 2017-08-04 DIAGNOSIS — F1120 Opioid dependence, uncomplicated: Secondary | ICD-10-CM

## 2017-08-04 DIAGNOSIS — Z23 Encounter for immunization: Secondary | ICD-10-CM

## 2017-08-04 MED ORDER — BUPRENORPHINE HCL-NALOXONE HCL 8-2 MG SL FILM: 3 | Film | Freq: Every day | SUBLINGUAL | 0 refills | 0 days | Status: DC

## 2017-08-04 MED ORDER — BUPRENORPHINE HCL-NALOXONE HCL 8-2 MG SL FILM: 3 | Film | Freq: Every day | SUBLINGUAL | 0 refills | 0 days | Status: AC

## 2017-08-04 MED ORDER — BUPRENORPHINE HCL-NALOXONE HCL 8-2 MG SL FILM: 3.0000 | ORAL_FILM | Freq: Every day | SUBLINGUAL | 0 refills | Status: DC

## 2017-08-04 MED ORDER — BUPRENORPHINE HCL-NALOXONE HCL 8-2 MG SL FILM
3.0000 | ORAL_FILM | Freq: Every day | SUBLINGUAL | 0 refills | Status: DC
Start: 2017-08-11 — End: 2017-08-18

## 2017-08-05 ENCOUNTER — Ambulatory Visit (HOSPITAL_BASED_OUTPATIENT_CLINIC_OR_DEPARTMENT_OTHER): Payer: Commercial Managed Care - HMO

## 2017-08-05 DIAGNOSIS — F1120 Opioid dependence, uncomplicated: Secondary | ICD-10-CM

## 2017-08-05 NOTE — Progress Notes (Signed)
Specimen collected and sent to lab  [facility], 08/05/2017, 1:54 PM

## 2017-08-06 LAB — OXYCODONE SCREEN URINE
OXYCOD SCRN URINE: NEGATIVE
OXYCOD UR SPEC GRAV: 1.01 (ref 1.003–1.035)
OXYCOD URINE CREAT: 61 md/dL
OXYCOD URINE PH: 6.5 (ref 4.5–8.5)

## 2017-08-06 LAB — BENZODIAZEPINES URINE: BENZODIAZEPINES URINE: NEGATIVE

## 2017-08-06 LAB — CANNABINOIDS URINE: CANNABINOIDS URINE: POSITIVE — AB

## 2017-08-06 LAB — OPIATES URINE: OPIATES URINE: NEGATIVE

## 2017-08-06 LAB — AMPHETAMINES URINE: AMPHETAMINES URINE: NEGATIVE

## 2017-08-06 LAB — ETHANOL URINE: ETHANOL URINE: NEGATIVE

## 2017-08-06 LAB — COCAINE METABOLITES URINE: COCAINE METABOLITES URINE: NEGATIVE

## 2017-08-06 LAB — BUPRENORPHINE SCREEN URINE: BUPRENORPHINE SCREEN URINE: POSITIVE

## 2017-08-11 ENCOUNTER — Ambulatory Visit (HOSPITAL_BASED_OUTPATIENT_CLINIC_OR_DEPARTMENT_OTHER): Payer: Commercial Managed Care - HMO | Admitting: Registered Nurse

## 2017-08-11 ENCOUNTER — Other Ambulatory Visit (HOSPITAL_BASED_OUTPATIENT_CLINIC_OR_DEPARTMENT_OTHER): Payer: Self-pay | Admitting: Internal Medicine

## 2017-08-11 DIAGNOSIS — F17200 Nicotine dependence, unspecified, uncomplicated: Secondary | ICD-10-CM

## 2017-08-11 DIAGNOSIS — F332 Major depressive disorder, recurrent severe without psychotic features: Secondary | ICD-10-CM

## 2017-08-11 DIAGNOSIS — D110 Benign neoplasm of parotid gland: Secondary | ICD-10-CM | POA: Diagnosis not present

## 2017-08-11 NOTE — Progress Notes (Signed)
PER Patient (self), Cheryl Dixon is a 62 year old female has requested a refill of BUPROPION .      Last Office Visit: 07/14/17   with ROLL, D   Last Physical Exam: 05/27/13    COLONOSCOPY due on 08/22/2017    Other Med Adult:  Most Recent BP Reading(s)  02/24/17 : (!) 138/96        Cholesterol (mg/dl)   Date Value   01/04/2010 264 (H)     LOW DENSITY LIPOPROTEIN DIRECT (mg/dl)   Date Value   01/04/2010 101 (H)     HIGH DENSITY LIPOPROTEIN (mg/dl)   Date Value   01/04/2010 38     No results found for: TG      THYROID SCREEN TSH REFLEX FT4 (uIU/mL)   Date Value   05/27/2013 2.130         No results found for: TSH    HEMOGLOBIN A1C (%)   Date Value   05/27/2013 5.9 (H)       No results found for: POCA1C      INR (no units)   Date Value   02/16/2007 1.0 (L)   07/03/2006 < 1.0 (L)       SODIUM (mmol/L)   Date Value   07/17/2014 140       POTASSIUM (mmol/L)   Date Value   07/17/2014 4.2           CREATININE (mg/dL)   Date Value   07/17/2014 1.0       Documented patient preferred pharmacies:    [facility], [place], [place]  Phone: [phone] Fax: [phone]

## 2017-08-13 NOTE — Progress Notes (Deleted)
Office Visit Note  Patient: Michelle Roberts             Date of Birth: 04-26-1956           MRN: [id number]             PCP: Antony Contras, MD Referring: Antony Contras, MD Visit Date: 08/27/2017 Occupation: @GUAROCC @    Subjective:  No chief complaint on file.   History of Present Illness: Michelle Roberts is a 62 y.o. female ***   Activities of Daily Living:  Patient reports morning stiffness for *** {minute/hour:19697}.   Patient {ACTIONS;DENIES/REPORTS:21021675::"Denies"} nocturnal pain.  Difficulty dressing/grooming: {ACTIONS;DENIES/REPORTS:21021675::"Denies"} Difficulty climbing stairs: {ACTIONS;DENIES/REPORTS:21021675::"Denies"} Difficulty getting out of chair: {ACTIONS;DENIES/REPORTS:21021675::"Denies"} Difficulty using hands for taps, buttons, cutlery, and/or writing: {ACTIONS;DENIES/REPORTS:21021675::"Denies"}   No Rheumatology ROS completed.   PMFS History:  Patient Active Problem List   Diagnosis Date Noted  . 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 Fibromyalgia 08/12/2016  . High risk medication use 08/12/2016  . Primary osteoarthritis of both hands 08/12/2016  . Acute midline low back pain 08/12/2016  . DDD (degenerative disc disease), lumbar 08/12/2016  . History of hypertension 08/12/2016  . History of high cholesterol 08/12/2016  . Thyroid ca ([place]) 08/12/2016  . History of hypothyroidism 08/12/2016  . History of gastroesophageal reflux (GERD) 08/12/2016  . History of TMJ syndrome 08/12/2016  . Burning tongue syndrome 08/12/2016  . History of vitamin D deficiency 08/12/2016  . Other sleep apnea 08/12/2016  . Vitamin D deficiency 08/12/2016  . Rheumatoid arthritis with rheumatoid factor of multiple sites without organ or systems involvement ([employer]) 12/31/2015    Past Medical History:  Diagnosis Date  . Anxiety   . Arthritis   . Depression   . Fibromyalgia   . GERD (gastroesophageal reflux disease)   . Hyperlipidemia   . Hypertension   .  Hypothyroidism    thyroidectomy  . Neoplasm of parotid gland   . Neuromuscular disorder ([place])   . PONV (postoperative nausea and vomiting)   . Sleep apnea     Family History  Problem Relation Age of Onset  . Alzheimer's disease Mother   . Alzheimer's disease Sister    Past Surgical History:  Procedure Laterality Date  . ABDOMINAL HYSTERECTOMY    . CHOLECYSTECTOMY    . HAMMER TOE SURGERY    . PAROTIDECTOMY Left 04/08/2016   Procedure: LEFT SUPERFICIAL PAROTIDECTOMY WITH FACIAL NERVE DISECTION;  Surgeon: Rozetta Nunnery, MD;  Location: [facility];  Service: ENT;  Laterality: Left;  . THYROIDECTOMY     Social History   Social History Narrative  . Not on file     Objective: Vital Signs: There were no vitals taken for this visit.   Physical Exam   Musculoskeletal Exam: ***  CDAI Exam: No CDAI exam completed.    Investigation: No additional findings.   Imaging: No results found.  Speciality Comments: No specialty comments available.    Procedures:  No procedures performed Allergies: Epinephrine   Assessment / Plan:     Visit Diagnoses: No diagnosis found.    Orders: No orders of the defined types were placed in this encounter.  No orders of the defined types were placed in this encounter.   Face-to-face time spent with patient was *** minutes. 50% of time was spent in counseling and coordination of care.  Follow-Up Instructions: No follow-ups on file.   Earnestine Mealing, CMA  Note - This record has been created using Editor, commissioning.  Chart creation errors have been sought, but may not always  have been located. Such creation errors do not reflect on  the standard of medical care.

## 2017-08-18 ENCOUNTER — Ambulatory Visit (HOSPITAL_BASED_OUTPATIENT_CLINIC_OR_DEPARTMENT_OTHER): Payer: Commercial Managed Care - HMO | Admitting: Internal Medicine

## 2017-08-18 ENCOUNTER — Other Ambulatory Visit (HOSPITAL_BASED_OUTPATIENT_CLINIC_OR_DEPARTMENT_OTHER): Payer: Self-pay | Admitting: Physician Assistant

## 2017-08-18 ENCOUNTER — Other Ambulatory Visit (HOSPITAL_BASED_OUTPATIENT_CLINIC_OR_DEPARTMENT_OTHER): Payer: Self-pay | Admitting: Internal Medicine

## 2017-08-18 DIAGNOSIS — Z23 Encounter for immunization: Secondary | ICD-10-CM

## 2017-08-18 DIAGNOSIS — IMO0001 Reserved for inherently not codable concepts without codable children: Secondary | ICD-10-CM

## 2017-08-18 DIAGNOSIS — F1120 Opioid dependence, uncomplicated: Secondary | ICD-10-CM

## 2017-08-18 DIAGNOSIS — F332 Major depressive disorder, recurrent severe without psychotic features: Secondary | ICD-10-CM

## 2017-08-18 DIAGNOSIS — F17200 Nicotine dependence, unspecified, uncomplicated: Secondary | ICD-10-CM

## 2017-08-18 MED ORDER — BUPRENORPHINE HCL-NALOXONE HCL 8-2 MG SL FILM
3.0000 | ORAL_FILM | Freq: Every day | SUBLINGUAL | 0 refills | Status: DC
Start: 2017-08-25 — End: 2017-08-18

## 2017-08-18 MED ORDER — BUPRENORPHINE HCL-NALOXONE HCL 8-2 MG SL FILM
3.0000 | ORAL_FILM | Freq: Every day | SUBLINGUAL | 0 refills | Status: DC
Start: 2017-08-18 — End: 2017-08-18

## 2017-08-18 MED ORDER — BUPRENORPHINE HCL-NALOXONE HCL 8-2 MG SL FILM: 3 | Film | Freq: Every day | SUBLINGUAL | 0 refills | 0 days | Status: DC

## 2017-08-18 MED ORDER — BUPRENORPHINE HCL-NALOXONE HCL 8-2 MG SL FILM
3.0000 | ORAL_FILM | Freq: Every day | SUBLINGUAL | 0 refills | Status: DC
Start: 2017-08-25 — End: 2017-09-01

## 2017-08-18 NOTE — Progress Notes (Signed)
Cheryl Dixon is a 62 year old female seen in follow up for opioid dependence:    Buprenorphine dose: 16 mg  Response, adequacy of dose: good  Relapses/close calls: none  Trigger: n/a  Meetings: no    Social history/events:  Went to [place] for Easter  Participated in group exercise on emotional regulation.    Present Medications:    Current Outpatient Medications on File Prior to Visit:  [DISCONTINUED] buprenorphine-naloxone (SUBOXONE) 8-2 MG FILM Place 3 Film under the tongue daily Max Daily Amount: 3 Film for 7 days Disp: 21 Film Rfl: 0   INCRUSE ELLIPTA 62.5 MCG/INH inhaler INHALE 1 PUFF BY MOUTH INTO LUNGS DAILY Disp: 1 Inhaler Rfl: 11   albuterol (PROVENTIL HFA,VENTOLIN HFA, PROAIR HFA) 108 (90 Base) MCG/ACT inhaler Inhale 2 puffs into the lungs every 6 (six) hours as needed for Wheezing or Shortness of breath Disp: 1 Inhaler Rfl: 2   naproxen (NAPROSYN) 500 MG tablet Take 1 tablet by mouth 2 (two) times daily with meals for 15 days Disp: 30 tablet Rfl: 0   docusate sodium (COLACE) 100 MG capsule Take 1 capsule by mouth 2 (two) times daily Disp: 60 capsule Rfl: 11   buPROPion (WELLBUTRIN SR) 150 MG 12 hr tablet Take 1 tablet by mouth 2 (two) times daily Disp: 60 tablet Rfl: 2   ipratropium-albuterol (DUO-NEB) 0.5-2.5 (3) MG/3ML SOLN Inhalation Solution Take 3 mLs by nebulization 4 (four) times daily. Disp: 180 mL Rfl: 0     No current facility-administered medications on file prior to visit.     Review of Systems:  Neurological exam: negative  PHQ-9 TOTAL SCORE 03/03/2017 01/05/2017 11/27/2016   Doc FlowSheet Total Score - - -   Doc FlowSheet Total Score 4 22 18            PHYSICAL EXAMINATION:  General appearance - healthy female in no distress  Eyes - pupils 3 mm  Skin - warm and dry  Neuro - nonfocal  Affect - normal    PMP reviewed.  No unauthorized prescriptions since last refill.    Assessment:  Opioid Dependence  Comment: condition gradually improving on current dose, actively participating in group.  Plan:   Continue buprenorphine, reviewed criteria for tapering when patient is ready.   Patient is counseled regarding relapse prevention, involvement in recovery groups, potential side effects of buprenorphine, and eventual taper of medication.    We discussed the patients current medications. The patient expressed understanding and no barriers to adherence were identified.  1. The patient indicates understanding of these issues and agrees with the plan.  Brief care plan is updated and reviewed with the patient.   2. The patient is given an After Visit Summary sheet that lists all medications with directions, allergies, orders placed during this encounter, and follow-up instructions.   3. I reviewed the patient's medical information and medical history   4. I reconciled the patient's medication list and prepared and supplied needed refills.   5. I have reviewed the past medical, family, and social history sections including the medications and allergies.    [name], MD

## 2017-08-18 NOTE — Addendum Note (Signed)
Addended by: Yomira Flitton on: 08/18/2017 07:50 PM     Modules accepted: Orders

## 2017-08-18 NOTE — Progress Notes (Signed)
PER Pharmacy, Cheryl Dixon is a 62 year old female has requested a refill of Bupropion SR 150 mg.      Last Office Visit: 07/14/17 with Roll, D  Last Physical Exam: 05/27/13    COLONOSCOPY due on 08/22/2017    Other Med Adult:  Most Recent BP Reading(s)  02/24/17 : (!) 138/96        Cholesterol (mg/dl)   Date Value   01/04/2010 264 (H)     LOW DENSITY LIPOPROTEIN DIRECT (mg/dl)   Date Value   01/04/2010 101 (H)     HIGH DENSITY LIPOPROTEIN (mg/dl)   Date Value   01/04/2010 38     No results found for: TG      THYROID SCREEN TSH REFLEX FT4 (uIU/mL)   Date Value   05/27/2013 2.130         No results found for: TSH    HEMOGLOBIN A1C (%)   Date Value   05/27/2013 5.9 (H)       No results found for: POCA1C      INR (no units)   Date Value   02/16/2007 1.0 (L)   07/03/2006 < 1.0 (L)       SODIUM (mmol/L)   Date Value   07/17/2014 140       POTASSIUM (mmol/L)   Date Value   07/17/2014 4.2           CREATININE (mg/dL)   Date Value   07/17/2014 1.0       Documented patient preferred pharmacies:    [facility], [place], [place]  Phone: [phone] Fax: [phone]

## 2017-08-18 NOTE — Addendum Note (Signed)
Addended by: Anyelin Mogle on: 08/18/2017 08:32 PM     Modules accepted: Orders

## 2017-08-19 LAB — FENTANYL URINE: FENTANYL URINE: NEGATIVE

## 2017-08-19 LAB — OXYCODONE SCREEN URINE
OXYCOD SCRN URINE: NEGATIVE
OXYCOD UR SPEC GRAV: 1.01 (ref 1.003–1.035)
OXYCOD URINE CREAT: 67 md/dL
OXYCOD URINE PH: 6.5 (ref 4.5–8.5)

## 2017-08-19 LAB — CANNABINOIDS URINE: CANNABINOIDS URINE: POSITIVE — AB

## 2017-08-19 LAB — BENZODIAZEPINES URINE: BENZODIAZEPINES URINE: NEGATIVE

## 2017-08-19 LAB — OPIATES URINE: OPIATES URINE: NEGATIVE

## 2017-08-19 LAB — COCAINE METABOLITES URINE: COCAINE METABOLITES URINE: NEGATIVE

## 2017-08-19 LAB — BUPRENORPHINE SCREEN URINE: BUPRENORPHINE SCREEN URINE: POSITIVE

## 2017-08-19 LAB — METHADONE URINE: METHADONE URINE: NEGATIVE

## 2017-08-19 LAB — AMPHETAMINES URINE: AMPHETAMINES URINE: NEGATIVE

## 2017-08-19 LAB — ETHANOL URINE: ETHANOL URINE: NEGATIVE

## 2017-08-24 DIAGNOSIS — E790 Hyperuricemia without signs of inflammatory arthritis and tophaceous disease: Secondary | ICD-10-CM | POA: Diagnosis not present

## 2017-08-25 ENCOUNTER — Ambulatory Visit (HOSPITAL_BASED_OUTPATIENT_CLINIC_OR_DEPARTMENT_OTHER): Payer: Commercial Managed Care - HMO | Admitting: Internal Medicine

## 2017-08-27 ENCOUNTER — Ambulatory Visit: Payer: 59 | Admitting: Rheumatology

## 2017-09-01 ENCOUNTER — Encounter (HOSPITAL_BASED_OUTPATIENT_CLINIC_OR_DEPARTMENT_OTHER): Payer: Self-pay | Admitting: Internal Medicine

## 2017-09-01 ENCOUNTER — Ambulatory Visit (HOSPITAL_BASED_OUTPATIENT_CLINIC_OR_DEPARTMENT_OTHER): Payer: Commercial Managed Care - HMO | Admitting: Internal Medicine

## 2017-09-01 ENCOUNTER — Other Ambulatory Visit (HOSPITAL_BASED_OUTPATIENT_CLINIC_OR_DEPARTMENT_OTHER): Payer: Self-pay | Admitting: Internal Medicine

## 2017-09-01 DIAGNOSIS — F1120 Opioid dependence, uncomplicated: Secondary | ICD-10-CM

## 2017-09-01 DIAGNOSIS — Z23 Encounter for immunization: Secondary | ICD-10-CM

## 2017-09-01 MED ORDER — BUPRENORPHINE HCL-NALOXONE HCL 8-2 MG SL FILM: 2 | Film | Freq: Every day | SUBLINGUAL | 0 refills | 0 days | Status: DC

## 2017-09-01 MED ORDER — BUPROPION HCL ER (SR) 200 MG PO TB12: 200 mg | tablet | Freq: Two times a day (BID) | ORAL | 2 refills | 0 days | Status: AC

## 2017-09-01 MED ORDER — BUPRENORPHINE HCL-NALOXONE HCL 8-2 MG SL FILM: Film | SUBLINGUAL | 0 refills | 0 days | Status: DC

## 2017-09-01 MED ORDER — BUPROPION HCL ER (SR) 200 MG PO TB12
200.0000 mg | ORAL_TABLET | Freq: Two times a day (BID) | ORAL | 2 refills | Status: DC
Start: 2017-09-01 — End: 2018-06-03

## 2017-09-01 MED ORDER — POLYETHYLENE GLYCOL 3350 17 GM/SCOOP PO POWD
17.0000 g | Freq: Every day | ORAL | 5 refills | Status: DC
Start: 2017-09-01 — End: 2018-06-18

## 2017-09-01 MED ORDER — POLYETHYLENE GLYCOL 3350 PO POWD: 17 g | g | Freq: Every day | ORAL | 5 refills | 0 days | Status: AC

## 2017-09-01 MED ORDER — BUPRENORPHINE HCL-NALOXONE HCL 8-2 MG SL FILM
ORAL_FILM | SUBLINGUAL | 0 refills | Status: DC
Start: 2017-09-01 — End: 2017-09-01

## 2017-09-01 MED ORDER — BUPRENORPHINE HCL-NALOXONE HCL 8-2 MG SL FILM
2.0000 | ORAL_FILM | Freq: Every day | SUBLINGUAL | 0 refills | Status: DC
Start: 2017-09-01 — End: 2017-09-15

## 2017-09-01 MED ORDER — BUPRENORPHINE HCL-NALOXONE HCL 8-2 MG SL FILM
2.0000 | ORAL_FILM | Freq: Every day | SUBLINGUAL | 0 refills | Status: DC
Start: 2017-09-01 — End: 2017-09-01

## 2017-09-01 NOTE — Progress Notes (Signed)
Cheryl Dixon is a 62 year old female seen in follow up for opioid dependence:    Buprenorphine dose: 16 mg by report - although per fill dates she is likely taking no more than 1.5 daily  Response, adequacy of dose: good  Relapses/close calls: clonazepam 0.5 mg x last four days  Prior to that it had been four days  Trigger: anxiety, panic attacks  Meetings: no    Social history/events:  Asks to increase wellbutrin - prefers because it doesn't cause urinary retention.  SSRIs caused this problem.    Present Medications:    Current Outpatient Medications on File Prior to Visit:  buPROPion (WELLBUTRIN SR) 150 MG 12 hr tablet TAKE 1 TABLET BY MOUTH TWICE DAILY Disp: 60 tablet Rfl: 11   [DISCONTINUED] buprenorphine-naloxone (SUBOXONE) 8-2 MG sublingual film Place 3 Film under the tongue daily Max Daily Amount: 3 Film for 14 days Disp: 42 Film Rfl: 0   INCRUSE ELLIPTA 62.5 MCG/INH inhaler INHALE 1 PUFF BY MOUTH INTO LUNGS DAILY Disp: 1 Inhaler Rfl: 11   albuterol (PROVENTIL HFA,VENTOLIN HFA, PROAIR HFA) 108 (90 Base) MCG/ACT inhaler Inhale 2 puffs into the lungs every 6 (six) hours as needed for Wheezing or Shortness of breath Disp: 1 Inhaler Rfl: 2   naproxen (NAPROSYN) 500 MG tablet Take 1 tablet by mouth 2 (two) times daily with meals for 15 days Disp: 30 tablet Rfl: 0   docusate sodium (COLACE) 100 MG capsule Take 1 capsule by mouth 2 (two) times daily Disp: 60 capsule Rfl: 11   ipratropium-albuterol (DUO-NEB) 0.5-2.5 (3) MG/3ML SOLN Inhalation Solution Take 3 mLs by nebulization 4 (four) times daily. Disp: 180 mL Rfl: 0     No current facility-administered medications on file prior to visit.     Review of Systems:  Neurological exam: negative  PHQ-9 TOTAL SCORE 03/03/2017 01/05/2017 11/27/2016   Doc FlowSheet Total Score - - -   Doc FlowSheet Total Score 4 22 18            PHYSICAL EXAMINATION:  General appearance - healthy female in no distress  Eyes - pupils 3 mm  Skin - warm and dry  Neuro - nonfocal  Affect -  normal    PMP reviewed.  No unauthorized prescriptions since last refill.    Assessment:  Opioid Dependence  Comment: lackluster participation in group, overall doing well, although still using nonprescribed clonazepam on occasion.  Plan:  Continue buprenorphine, reviewed criteria for tapering when patient is ready.  Change to every two weeks, more frequent attendance is not likely.  Patient is counseled regarding relapse prevention, involvement in recovery groups, potential side effects of buprenorphine, and eventual taper of medication.    We discussed the patients current medications. The patient expressed understanding and no barriers to adherence were identified.  1. The patient indicates understanding of these issues and agrees with the plan.  Brief care plan is updated and reviewed with the patient.   2. The patient is given an After Visit Summary sheet that lists all medications with directions, allergies, orders placed during this encounter, and follow-up instructions.   3. I reviewed the patient's medical information and medical history   4. I reconciled the patient's medication list and prepared and supplied needed refills.   5. I have reviewed the past medical, family, and social history sections including the medications and allergies.    [name], MD

## 2017-09-02 LAB — OXYCODONE SCREEN URINE
OXYCOD SCRN URINE: NEGATIVE
OXYCOD UR SPEC GRAV: 1.01 (ref 1.003–1.035)
OXYCOD URINE CREAT: 114 md/dL
OXYCOD URINE PH: 7.5 (ref 4.5–8.5)

## 2017-09-02 LAB — COCAINE METABOLITES URINE: COCAINE METABOLITES URINE: NEGATIVE

## 2017-09-02 LAB — ETHANOL URINE: ETHANOL URINE: NEGATIVE

## 2017-09-02 LAB — BENZODIAZEPINES URINE: BENZODIAZEPINES URINE: NEGATIVE

## 2017-09-02 LAB — OPIATES URINE: OPIATES URINE: NEGATIVE

## 2017-09-02 LAB — METHADONE URINE: METHADONE URINE: NEGATIVE

## 2017-09-02 LAB — BUPRENORPHINE SCREEN URINE: BUPRENORPHINE SCREEN URINE: POSITIVE

## 2017-09-02 LAB — AMPHETAMINES URINE: AMPHETAMINES URINE: NEGATIVE

## 2017-09-02 LAB — CANNABINOIDS URINE: CANNABINOIDS URINE: POSITIVE — AB

## 2017-09-02 LAB — FENTANYL URINE: FENTANYL URINE: NEGATIVE

## 2017-09-03 ENCOUNTER — Other Ambulatory Visit: Payer: Self-pay | Admitting: Podiatry

## 2017-09-03 ENCOUNTER — Ambulatory Visit: Payer: 59 | Admitting: Podiatry

## 2017-09-03 ENCOUNTER — Ambulatory Visit (INDEPENDENT_AMBULATORY_CARE_PROVIDER_SITE_OTHER): Payer: 59

## 2017-09-03 ENCOUNTER — Encounter: Payer: Self-pay | Admitting: Podiatry

## 2017-09-03 DIAGNOSIS — M779 Enthesopathy, unspecified: Secondary | ICD-10-CM

## 2017-09-03 DIAGNOSIS — M2042 Other hammer toe(s) (acquired), left foot: Secondary | ICD-10-CM

## 2017-09-03 DIAGNOSIS — M79674 Pain in right toe(s): Secondary | ICD-10-CM

## 2017-09-03 DIAGNOSIS — M205X1 Other deformities of toe(s) (acquired), right foot: Secondary | ICD-10-CM

## 2017-09-03 MED ORDER — TRIAMCINOLONE ACETONIDE 10 MG/ML IJ SUSP
10.0000 mg | Freq: Once | INTRAMUSCULAR | Status: AC
  Administered 2017-09-03: 10 mg

## 2017-09-03 NOTE — Progress Notes (Signed)
Subjective:   Patient ID: Michelle Roberts, female   DOB: 62 y.o.   MRN: [id number]   HPI Patient presents stating she is had a lot of pain in her big toe joint right of approximate 9 months duration.  Does not remember specific injury but states is been inflamed and she was given tentative diagnosis of gout by her family physician   ROS      Objective:  Physical Exam  Neurovascular status intact with patient's right first MPJ being inflamed with fluid buildup and also is complaining of keratotic lesion digit 5 left is painful when palpated with chronic hammertoe deformity     Assessment:  Hallux limitus deformity with inflammatory capsulitis of the joint with keratotic lesion digit 5 left     Plan:  H&P conditions reviewed and injected the first MPJ 3 mg Kenalog 5 mg Xylocaine advised on physical therapy anti-inflammatories and debrided the digit on the fifth digit left with no iatrogenic bleeding and discussed possible hammertoe correction.  Explained that ultimately this right big toe joint may need to be corrected depending on response to conservative treatment  X-ray indicates there is spurring of the first metatarsal head with narrowing of the joint surface and elevation of the first metatarsal segment

## 2017-09-03 NOTE — Patient Instructions (Signed)

## 2017-09-08 ENCOUNTER — Ambulatory Visit (HOSPITAL_BASED_OUTPATIENT_CLINIC_OR_DEPARTMENT_OTHER): Payer: Commercial Managed Care - HMO | Admitting: Internal Medicine

## 2017-09-14 ENCOUNTER — Encounter (HOSPITAL_BASED_OUTPATIENT_CLINIC_OR_DEPARTMENT_OTHER): Payer: Self-pay

## 2017-09-14 ENCOUNTER — Telehealth (HOSPITAL_BASED_OUTPATIENT_CLINIC_OR_DEPARTMENT_OTHER): Payer: Self-pay

## 2017-09-14 NOTE — Telephone Encounter (Signed)
-----   Message from [place] sent at 09/14/2017 12:09 PM EDT -----  Regarding: Bills  Contact: [phone]  Cheryl Dixon [id number], 61 year old, female    Calls today:  Clinical Questions ([place])    Name of person calling Patient  Specific nature of request Needs letter for bills  Return phone number [phone]  Person calling on behalf of patient: Patient (self)        Patient's language of care: English    Patient does not need an interpreter.    Patient's PCP: [name], MD

## 2017-09-14 NOTE — Progress Notes (Signed)
Called pt regarding message sent by call center. Pt requesting Utility letter for both gas and electric.   Utility letters created, signed and faxed. Confirmation received on 09/14/17 @4 :12 pm.  Also reminded patient of appointment with Dr.Roll tomorrow 09/15/17.     [place]: [id number]    [employer]- Electric  Acct: [id number]

## 2017-09-15 ENCOUNTER — Other Ambulatory Visit (HOSPITAL_BASED_OUTPATIENT_CLINIC_OR_DEPARTMENT_OTHER): Payer: Self-pay | Admitting: Internal Medicine

## 2017-09-15 ENCOUNTER — Ambulatory Visit (HOSPITAL_BASED_OUTPATIENT_CLINIC_OR_DEPARTMENT_OTHER): Payer: Commercial Managed Care - HMO | Admitting: Internal Medicine

## 2017-09-15 DIAGNOSIS — F1120 Opioid dependence, uncomplicated: Secondary | ICD-10-CM

## 2017-09-15 DIAGNOSIS — Z23 Encounter for immunization: Secondary | ICD-10-CM

## 2017-09-15 MED ORDER — BUPRENORPHINE HCL-NALOXONE HCL 8-2 MG SL FILM: 2 | Film | Freq: Every day | SUBLINGUAL | 0 refills | 0 days | Status: DC

## 2017-09-15 MED ORDER — BUPRENORPHINE HCL-NALOXONE HCL 8-2 MG SL FILM
2.0000 | ORAL_FILM | Freq: Every day | SUBLINGUAL | 0 refills | Status: DC
Start: 2017-09-15 — End: 2017-09-27

## 2017-09-17 ENCOUNTER — Ambulatory Visit: Payer: 59 | Admitting: Podiatry

## 2017-09-17 ENCOUNTER — Ambulatory Visit: Payer: 59 | Admitting: Orthotics

## 2017-09-17 ENCOUNTER — Encounter: Payer: Self-pay | Admitting: Podiatry

## 2017-09-17 ENCOUNTER — Other Ambulatory Visit: Payer: Self-pay

## 2017-09-17 DIAGNOSIS — M2042 Other hammer toe(s) (acquired), left foot: Secondary | ICD-10-CM

## 2017-09-17 DIAGNOSIS — M779 Enthesopathy, unspecified: Secondary | ICD-10-CM

## 2017-09-17 DIAGNOSIS — M205X1 Other deformities of toe(s) (acquired), right foot: Secondary | ICD-10-CM

## 2017-09-17 NOTE — Progress Notes (Signed)
Patient is being seen today for CMFO per Dr. Paulla Dolly.  Presents with SHL right and hx of callus lateral 5th L.Marland KitchenMarland KitchenPlan on semi rigid device with reverse Morton's b/l (for comfort), and 5th met head cut out/offload.   [name]  Patietn advised [phone]

## 2017-09-17 NOTE — Progress Notes (Signed)
Subjective:   Patient ID: Michelle Roberts, female   DOB: 62 y.o.   MRN: [id number]   HPI Patient presents with quite a bit of pain still in the big toe joint right stating that the injection has helped some but is still painful and discomfort in the lesser MPJs bilateral   ROS      Objective:  Physical Exam  Neurovascular status is intact with patient found to have hallux limitus deformity first MPJ right with reduced motion and pain within the joint surface and chronic inflammation of the lesser MPJs     Assessment:  Hallux limitus deformity right along with inflammatory capsulitis bilateral     Plan:  H&P conditions reviewed and I did discuss long-term surgery to the big toe joint with hopefully being able to save the joint and the longer she waits the heart will be effects.  I then went ahead today and I did scan for customized orthotics to try to reduce plantar pressures on the feet and reduce the pressure against the big toe joint

## 2017-09-22 ENCOUNTER — Ambulatory Visit (HOSPITAL_BASED_OUTPATIENT_CLINIC_OR_DEPARTMENT_OTHER): Payer: Commercial Managed Care - HMO | Admitting: Internal Medicine

## 2017-09-22 DIAGNOSIS — F1120 Opioid dependence, uncomplicated: Secondary | ICD-10-CM

## 2017-09-22 NOTE — Progress Notes (Signed)
RN visit, and lab only

## 2017-09-23 LAB — OXYCODONE SCREEN URINE
OXYCOD SCRN URINE: NEGATIVE
OXYCOD UR SPEC GRAV: 1.005 (ref 1.003–1.035)
OXYCOD URINE CREAT: 35 md/dL
OXYCOD URINE PH: 5.5 (ref 4.5–8.5)

## 2017-09-23 LAB — FENTANYL URINE: FENTANYL URINE: NEGATIVE

## 2017-09-23 LAB — BENZODIAZEPINES URINE: BENZODIAZEPINES URINE: NEGATIVE

## 2017-09-23 LAB — CANNABINOIDS URINE: CANNABINOIDS URINE: POSITIVE — AB

## 2017-09-23 LAB — METHADONE URINE: METHADONE URINE: NEGATIVE

## 2017-09-23 LAB — COCAINE METABOLITES URINE: COCAINE METABOLITES URINE: NEGATIVE

## 2017-09-23 LAB — BUPRENORPHINE SCREEN URINE: BUPRENORPHINE SCREEN URINE: POSITIVE

## 2017-09-23 LAB — ETHANOL URINE: ETHANOL URINE: NEGATIVE

## 2017-09-23 LAB — AMPHETAMINES URINE: AMPHETAMINES URINE: NEGATIVE

## 2017-09-23 LAB — OPIATES URINE: OPIATES URINE: NEGATIVE

## 2017-09-27 ENCOUNTER — Other Ambulatory Visit (HOSPITAL_BASED_OUTPATIENT_CLINIC_OR_DEPARTMENT_OTHER): Payer: Self-pay | Admitting: Internal Medicine

## 2017-09-27 DIAGNOSIS — F1120 Opioid dependence, uncomplicated: Secondary | ICD-10-CM

## 2017-09-27 MED ORDER — BUPRENORPHINE HCL-NALOXONE HCL 8-2 MG SL FILM: 2 | Film | Freq: Every day | SUBLINGUAL | 0 refills | 0 days | Status: DC

## 2017-09-27 MED ORDER — BUPRENORPHINE HCL-NALOXONE HCL 8-2 MG SL FILM: 2 | Film | Freq: Every day | SUBLINGUAL | 0 refills | 0 days | Status: AC

## 2017-09-27 MED ORDER — BUPRENORPHINE HCL-NALOXONE HCL 8-2 MG SL FILM
2.00 | ORAL_FILM | Freq: Every day | SUBLINGUAL | 0 refills | Status: AC
Start: 2009-09-29 — End: 2009-10-13

## 2017-09-27 MED ORDER — BUPRENORPHINE HCL-NALOXONE HCL 8-2 MG SL FILM
2.0000 | ORAL_FILM | Freq: Every day | SUBLINGUAL | 0 refills | Status: DC
Start: 2017-09-29 — End: 2017-10-13

## 2017-09-29 ENCOUNTER — Ambulatory Visit (HOSPITAL_BASED_OUTPATIENT_CLINIC_OR_DEPARTMENT_OTHER): Payer: Commercial Managed Care - HMO | Admitting: Registered Nurse

## 2017-09-29 ENCOUNTER — Telehealth: Payer: Self-pay | Admitting: Podiatry

## 2017-09-29 NOTE — Telephone Encounter (Signed)
Pt called asking about the orthotics.She stated she was to be getting a call with coverage information.  It looks like insurance is not going to cover them.  Pt wishes to cancel the order for now until she has the funds to pay.   I called sna spoke to alana @ [employer] and they are canceling the order.

## 2017-10-05 ENCOUNTER — Telehealth: Payer: Self-pay | Admitting: Podiatry

## 2017-10-05 ENCOUNTER — Telehealth: Payer: Self-pay | Admitting: *Deleted

## 2017-10-05 NOTE — Telephone Encounter (Signed)
Pt states she would like to have the surgery and on the right big toe spur.

## 2017-10-05 NOTE — Telephone Encounter (Signed)
I informed pt I had reviewed her chart and did not see that she had signed a consent form. Pt states she did not sign for surgery yet. I told her I would have a scheduler call tomorrow to schedule for a consultation. Pt asked if she had to pay $40.00 again, and I told her she would receive pre-op and post-op instructions and surgical procedure information. Pt states understanding.

## 2017-10-05 NOTE — Telephone Encounter (Signed)
This is Michelle Roberts. I just wanted to let the nurse know that Dr. Paulla Dolly had talked about doing surgery on my right great toe. I think I have decided to do that. I have left a message for the surgical coordinator as well. You can reach me at [phone]. Thank you.

## 2017-10-06 ENCOUNTER — Ambulatory Visit (HOSPITAL_BASED_OUTPATIENT_CLINIC_OR_DEPARTMENT_OTHER): Payer: Commercial Managed Care - HMO | Admitting: Internal Medicine

## 2017-10-06 ENCOUNTER — Encounter (HOSPITAL_BASED_OUTPATIENT_CLINIC_OR_DEPARTMENT_OTHER): Payer: Self-pay | Admitting: Internal Medicine

## 2017-10-06 ENCOUNTER — Telehealth (HOSPITAL_BASED_OUTPATIENT_CLINIC_OR_DEPARTMENT_OTHER): Payer: Self-pay | Admitting: Registered Nurse

## 2017-10-06 ENCOUNTER — Telehealth: Payer: Self-pay | Admitting: *Deleted

## 2017-10-06 DIAGNOSIS — F1120 Opioid dependence, uncomplicated: Secondary | ICD-10-CM

## 2017-10-06 LAB — OXYCODONE SCREEN URINE
OXYCOD SCRN URINE: NEGATIVE
OXYCOD UR SPEC GRAV: 1.01 (ref 1.003–1.035)
OXYCOD URINE CREAT: 78 md/dL
OXYCOD URINE PH: 5 (ref 4.5–8.5)

## 2017-10-06 LAB — ETHANOL URINE: ETHANOL URINE: NEGATIVE

## 2017-10-06 LAB — BENZODIAZEPINES URINE: BENZODIAZEPINES URINE: NEGATIVE

## 2017-10-06 LAB — COCAINE METABOLITES URINE: COCAINE METABOLITES URINE: NEGATIVE

## 2017-10-06 LAB — OPIATES URINE: OPIATES URINE: NEGATIVE

## 2017-10-06 LAB — BUPRENORPHINE SCREEN URINE: BUPRENORPHINE SCREEN URINE: POSITIVE

## 2017-10-06 NOTE — Progress Notes (Signed)
Follow Up Addictions Visit - :           Current substance abuse treatment:  Groups: Yes    Present Medications:  Current Outpatient Medications   Medication Sig    buprenorphine-naloxone (SUBOXONE) 8-2 MG sublingual film Place 2 Film under the tongue daily Take one and one-half films under the tongue daily Max Daily Amount: 2 Film for 14 days    buPROPion (WELLBUTRIN SR) 200 MG 12 hr tablet Take 1 tablet by mouth 2 (two) times daily    polyethylene glycol (MIRALAX) powder Take 17 g by mouth daily Mix in 64 ounces of clear liquid and take as directed    INCRUSE ELLIPTA 62.5 MCG/INH inhaler INHALE 1 PUFF BY MOUTH INTO LUNGS DAILY    albuterol (PROVENTIL HFA,VENTOLIN HFA, PROAIR HFA) 108 (90 Base) MCG/ACT inhaler Inhale 2 puffs into the lungs every 6 (six) hours as needed for Wheezing or Shortness of breath    naproxen (NAPROSYN) 500 MG tablet Take 1 tablet by mouth 2 (two) times daily with meals for 15 days    docusate sodium (COLACE) 100 MG capsule Take 1 capsule by mouth 2 (two) times daily    ipratropium-albuterol (DUO-NEB) 0.5-2.5 (3) MG/3ML SOLN Inhalation Solution Take 3 mLs by nebulization 4 (four) times daily.     No current facility-administered medications for this visit.         Review of Systems:  Neurological exam: negative    PHYSICAL EXAMINATION:  LMP 11/20/1991  General appearance - healthy, alert, well developed, well nourished  Eyes - conjunctivae/corneas clear. PERRL, EOM's intact. Fundi benign  Lungs - Percussion normal. Good diaphragmatic excursion. Lungs clear to auscultation bilaterally  Heart - PMI normal. No lifts, heaves, or thrills. RRR. No murmurs, clicks, gallops or rubs  Abdomen - Abdomen soft, non-tender. BS normal. No masses, no organomegaly  Extremities - Extremities normal. No deformities, edema, or skin discoloration  Musculoskeletal - Spine ROM normal. Muscular strength intact.  Neuro - Gait normal. Reflexes normal and symmetric. Sensation grossly  normal    Assessment:  1. Opioid Dependence/ addiction.    (F11.20) Opioid dependence on agonist therapy ([place])  Comment: Met with patient today in both group setting and after group.  Pt states she is having some difficulty living in her apartment for various different reasons.  Had to get rid of her bed and some furniture.  Provided with information about [facility] in [place] for assistance.  Doing otherwise well.    Plan: OXYCODONE SCREEN URINE, OPIATES URINE, ETHANOL         URINE, COCAINE METABOLITES URINE, BUPRENORPHINE        SCREEN URINE, BENZODIAZEPINES URINE                 >50% of face to face contact spent in counseling.

## 2017-10-06 NOTE — Progress Notes (Signed)
Called patient to let her know that I faxed her rx to [facility].

## 2017-10-06 NOTE — Telephone Encounter (Signed)
"  I'm a patient of Dr. Paulla Dolly.  He had told me to think about having my bone spur in my right big toe removed.  I got an injection several weeks ago which did not help at all.  So, I have decided I want to go ahead and have the surgical procedure.  I'm looking forward to hearing from you soon."

## 2017-10-07 ENCOUNTER — Ambulatory Visit: Payer: 59 | Admitting: Podiatry

## 2017-10-07 ENCOUNTER — Encounter: Payer: Self-pay | Admitting: Podiatry

## 2017-10-07 DIAGNOSIS — M205X1 Other deformities of toe(s) (acquired), right foot: Secondary | ICD-10-CM | POA: Diagnosis not present

## 2017-10-07 NOTE — Patient Instructions (Signed)
Pre-Operative Instructions  Congratulations, you have decided to take an important step towards improving your quality of life.  You can be assured that the doctors and staff at [facility] will be with you every step of the way.  Here are some important things you should know:  1. Plan to be at the surgery center/hospital at least 1 (one) hour prior to your scheduled time, unless otherwise directed by the surgical center/hospital staff.  You must have a responsible adult accompany you, remain during the surgery and drive you home.  Make sure you have directions to the surgical center/hospital to ensure you arrive on time. 2. If you are having surgery at [facility] or [facility], you will need a copy of your medical history and physical form from your family physician within one month prior to the date of surgery. We will give you a form for your primary physician to complete.  3. We make every effort to accommodate the date you request for surgery.  However, there are times where surgery dates or times have to be moved.  We will contact you as soon as possible if a change in schedule is required.   4. No aspirin/ibuprofen for one week before surgery.  If you are on aspirin, any non-steroidal anti-inflammatory medications (Mobic, Aleve, Ibuprofen) should not be taken seven (7) days prior to your surgery.  You make take Tylenol for pain prior to surgery.  5. Medications - If you are taking daily heart and blood pressure medications, seizure, reflux, allergy, asthma, anxiety, pain or diabetes medications, make sure you notify the surgery center/hospital before the day of surgery so they can tell you which medications you should take or avoid the day of surgery. 6. No food or drink after midnight the night before surgery unless directed otherwise by surgical center/hospital staff. 7. No alcoholic beverages 24-hours prior to surgery.  No smoking 24-hours prior or 24-hours after  surgery. 8. Wear loose pants or shorts. They should be loose enough to fit over bandages, boots, and casts. 9. Don't wear slip-on shoes. Sneakers are preferred. 10. Bring your boot with you to the surgery center/hospital.  Also bring crutches or a walker if your physician has prescribed it for you.  If you do not have this equipment, it will be provided for you after surgery. 11. If you have not been contacted by the surgery center/hospital by the day before your surgery, call to confirm the date and time of your surgery. 12. Leave-time from work may vary depending on the type of surgery you have.  Appropriate arrangements should be made prior to surgery with your employer. 13. Prescriptions will be provided immediately following surgery by your doctor.  Fill these as soon as possible after surgery and take the medication as directed. Pain medications will not be refilled on weekends and must be approved by the doctor. 14. Remove nail polish on the operative foot and avoid getting pedicures prior to surgery. 15. Wash the night before surgery.  The night before surgery wash the foot and leg well with water and the antibacterial soap provided. Be sure to pay special attention to beneath the toenails and in between the toes.  Wash for at least three (3) minutes. Rinse thoroughly with water and dry well with a towel.  Perform this wash unless told not to do so by your physician.  Enclosed: 1 Ice pack (please put in freezer the night before surgery)   1 Hibiclens skin cleaner     Pre-op instructions  If you have any questions regarding the instructions, please do not hesitate to call our office.  [place]: [address], [place], [place] [address] -- [phone]  [place]: [address] [place], [place] [address] -- [phone]  [place]: [address]  [place], [place] [address] -- [phone]  [place]: [address], [address], [place], [place] [address] -- [phone]  Website:  [email or web address] 

## 2017-10-07 NOTE — Progress Notes (Signed)
Subjective:   Patient ID: Michelle Roberts, female   DOB: 62 y.o.   MRN: [id number]   HPI Patient states she is ready to get this point fixed due to the pain and she is here for consultation   ROS      Objective:  Physical Exam  Neurovascular status intact negative Homans sign noted with patient's right first MPJ showing reduced motion no crepitus but pain with palpation to the joint     Assessment:  Hallux limitus rigidus deformity right     Plan:  H&P x-ray reviewed and recommended biplanar osteotomy with removal of spur and allowed her to go over consent form reviewing procedure and risk and alternative treatments.  Patient wants surgery and after extensive review signed consent form is scheduled for outpatient surgery and is given all preoperative instructions.  Patient will have this done at the surgical center and was dispensed air fracture walker today with all instructions on usage and understands to recovery take 6 months to 1 year and ultimately could require fusion or joint implantation procedure encouraged to call with questions

## 2017-10-07 NOTE — Telephone Encounter (Signed)
I am returning your call.  You need to schedule an appointment for a consultation with Dr. Paulla Dolly in order to schedule surgery.  "I have an appointment today."  Great, we will determine a surgery date then.  "Okay, is there any way you can let me know how much my insurance will cover and how much I will have to pay out of pocket?"  I cannot let you know that today.  I do not know what procedure(s) you will have.  Jocelyn Lamer, Scientist, clinical (histocompatibility and immunogenetics),  will call you with that information.

## 2017-10-08 ENCOUNTER — Other Ambulatory Visit: Payer: 59 | Admitting: Orthotics

## 2017-10-13 ENCOUNTER — Telehealth (HOSPITAL_BASED_OUTPATIENT_CLINIC_OR_DEPARTMENT_OTHER): Payer: Self-pay

## 2017-10-13 ENCOUNTER — Other Ambulatory Visit (HOSPITAL_BASED_OUTPATIENT_CLINIC_OR_DEPARTMENT_OTHER): Payer: Self-pay | Admitting: Internal Medicine

## 2017-10-13 ENCOUNTER — Ambulatory Visit (HOSPITAL_BASED_OUTPATIENT_CLINIC_OR_DEPARTMENT_OTHER): Payer: 59 | Admitting: Internal Medicine

## 2017-10-13 ENCOUNTER — Ambulatory Visit (HOSPITAL_BASED_OUTPATIENT_CLINIC_OR_DEPARTMENT_OTHER): Payer: Commercial Managed Care - HMO | Admitting: Internal Medicine

## 2017-10-13 DIAGNOSIS — F1120 Opioid dependence, uncomplicated: Secondary | ICD-10-CM

## 2017-10-13 MED ORDER — BUPRENORPHINE HCL-NALOXONE HCL 8-2 MG SL FILM
2.0000 | ORAL_FILM | Freq: Every day | SUBLINGUAL | 0 refills | Status: DC
Start: 2017-10-13 — End: 2017-10-20

## 2017-10-13 MED ORDER — BUPRENORPHINE HCL-NALOXONE HCL 8-2 MG SL FILM: 2 | Film | Freq: Every day | SUBLINGUAL | 0 refills | 0 days | Status: DC

## 2017-10-13 NOTE — Progress Notes (Signed)
Spoke with patient states has enough to last till next week and doesn't need script . Will come in next week .

## 2017-10-13 NOTE — Telephone Encounter (Signed)
I returned her call and informed her that she needs to schedule an appointment with Dr. Paulla Dolly for a consultation.  We will schedule her surgery for July 16.

## 2017-10-16 ENCOUNTER — Other Ambulatory Visit (HOSPITAL_BASED_OUTPATIENT_CLINIC_OR_DEPARTMENT_OTHER): Payer: Self-pay | Admitting: Internal Medicine

## 2017-10-16 DIAGNOSIS — F17200 Nicotine dependence, unspecified, uncomplicated: Secondary | ICD-10-CM

## 2017-10-16 MED ORDER — NICOTINE 14 MG/24HR TD PT24: 1 | patch | TRANSDERMAL | 1 refills | 0 days | Status: DC

## 2017-10-16 MED ORDER — NICOTINE 14 MG/24HR TD PT24
1.0000 | MEDICATED_PATCH | TRANSDERMAL | 1 refills | Status: DC
Start: 2017-10-16 — End: 2017-12-11

## 2017-10-16 NOTE — Progress Notes (Signed)
PER Pharmacy, Cheryl Dixon is a 62 year old female has requested a refill of nicotine (NICODERM CQ) 14 MG/24HR            Last Office Visit: 10/06/2017 with [name]  Last Physical Exam: 05/27/2013    COLONOSCOPY due on 08/22/2017    Other Med Adult:  Most Recent BP Reading(s)  02/24/17 : (!) 138/96        Cholesterol (mg/dl)   Date Value   01/04/2010 264 (H)     LOW DENSITY LIPOPROTEIN DIRECT (mg/dl)   Date Value   01/04/2010 101 (H)     HIGH DENSITY LIPOPROTEIN (mg/dl)   Date Value   01/04/2010 38     No results found for: TG      THYROID SCREEN TSH REFLEX FT4 (uIU/mL)   Date Value   05/27/2013 2.130         No results found for: TSH    HEMOGLOBIN A1C (%)   Date Value   05/27/2013 5.9 (H)       No results found for: POCA1C      INR (no units)   Date Value   02/16/2007 1.0 (L)   07/03/2006 < 1.0 (L)       SODIUM (mmol/L)   Date Value   07/17/2014 140       POTASSIUM (mmol/L)   Date Value   07/17/2014 4.2           CREATININE (mg/dL)   Date Value   07/17/2014 1.0       Documented patient preferred pharmacies:    [facility], [place], [place]  Phone: [phone] Fax: [phone]

## 2017-10-20 ENCOUNTER — Encounter (HOSPITAL_BASED_OUTPATIENT_CLINIC_OR_DEPARTMENT_OTHER): Payer: Self-pay | Admitting: Internal Medicine

## 2017-10-20 ENCOUNTER — Ambulatory Visit (HOSPITAL_BASED_OUTPATIENT_CLINIC_OR_DEPARTMENT_OTHER): Payer: 59 | Admitting: Internal Medicine

## 2017-10-20 DIAGNOSIS — F1120 Opioid dependence, uncomplicated: Secondary | ICD-10-CM

## 2017-10-20 MED ORDER — BUPRENORPHINE HCL-NALOXONE HCL 8-2 MG SL FILM
2.0000 | ORAL_FILM | Freq: Every day | SUBLINGUAL | 0 refills | Status: DC
Start: 2017-10-20 — End: 2017-10-27

## 2017-10-20 MED ORDER — BUPRENORPHINE HCL-NALOXONE HCL 8-2 MG SL FILM: 2 | Film | Freq: Every day | SUBLINGUAL | 0 refills | 0 days | Status: DC

## 2017-10-20 NOTE — Progress Notes (Signed)
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    Present Medications:    Current Outpatient Medications on File Prior to Visit:  nicotine (NICOTINE STEP 2) 14 MG/24HR Place 1 patch onto the skin daily Disp: 28 patch Rfl: 1   [DISCONTINUED] buprenorphine-naloxone (SUBOXONE) 8-2 MG sublingual film Place 2 Film under the tongue daily Take one and one-half films under the tongue daily Max Daily Amount: 2 Film for 14 days Disp: 28 Film Rfl: 0   buPROPion (WELLBUTRIN SR) 200 MG 12 hr tablet Take 1 tablet by mouth 2 (two) times daily Disp: 60 tablet Rfl: 2   polyethylene glycol (MIRALAX) powder Take 17 g by mouth daily Mix in 64 ounces of clear liquid and take as directed Disp: 500 g Rfl: 5   INCRUSE ELLIPTA 62.5 MCG/INH inhaler INHALE 1 PUFF BY MOUTH INTO LUNGS DAILY Disp: 1 Inhaler Rfl: 11   albuterol (PROVENTIL HFA,VENTOLIN HFA, PROAIR HFA) 108 (90 Base) MCG/ACT inhaler Inhale 2 puffs into the lungs every 6 (six) hours as needed for Wheezing or Shortness of breath Disp: 1 Inhaler Rfl: 2   naproxen (NAPROSYN) 500 MG tablet Take 1 tablet by mouth 2 (two) times daily with meals for 15 days Disp: 30 tablet Rfl: 0   docusate sodium (COLACE) 100 MG capsule Take 1 capsule by mouth 2 (two) times daily Disp: 60 capsule Rfl: 11   ipratropium-albuterol (DUO-NEB) 0.5-2.5 (3) MG/3ML SOLN Inhalation Solution Take 3 mLs by nebulization 4 (four) times daily. Disp: 180 mL Rfl: 0     No current facility-administered medications on file prior to visit.     Review of Systems:  Neurological exam: negative  PHQ-9 TOTAL SCORE 03/03/2017 01/05/2017 11/27/2016   Doc FlowSheet Total Score - - -   Doc FlowSheet Total Score 4 22 18            PHYSICAL EXAMINATION:  General appearance - healthy female in no distress  Eyes - pupils 3 mm  Skin  - warm and dry  Neuro - nonfocal  Affect - normal    PMP reviewed.  No unauthorized prescriptions since last refill.    Assessment:  Opioid Dependence  Comment: condition gradually improving on current dose, actively participating in group.  Plan:  Continue buprenorphine, reviewed criteria for tapering when patient is ready.   Patient is counseled regarding relapse prevention, involvement in recovery groups, potential side effects of buprenorphine, and eventual taper of medication.    We discussed the patients current medications. The patient expressed understanding and no barriers to adherence were identified.  1. The patient indicates understanding of these issues and agrees with the plan.  Brief care plan is updated and reviewed with the patient.   2. The patient is given an After Visit Summary sheet that lists all medications with directions, allergies, orders placed during this encounter, and follow-up instructions.   3. I reviewed the patient's medical information and medical history   4. I reconciled the patient's medication list and prepared and supplied needed refills.   5. I have reviewed the past medical, family, and social history sections including the medications and allergies.    [name], MD

## 2017-10-21 LAB — BUPRENORPHINE SCREEN URINE: BUPRENORPHINE SCREEN URINE: POSITIVE

## 2017-10-21 LAB — OXYCODONE SCREEN URINE
OXYCOD SCRN URINE: NEGATIVE
OXYCOD UR SPEC GRAV: 1.02 (ref 1.003–1.035)
OXYCOD URINE CREAT: 191 md/dL
OXYCOD URINE PH: 6 (ref 4.5–8.5)

## 2017-10-21 LAB — FENTANYL URINE: FENTANYL URINE: NEGATIVE

## 2017-10-21 LAB — ETHANOL URINE: ETHANOL URINE: NEGATIVE

## 2017-10-21 LAB — AMPHETAMINES URINE: AMPHETAMINES URINE: NEGATIVE

## 2017-10-21 LAB — METHADONE URINE: METHADONE URINE: NEGATIVE

## 2017-10-21 LAB — COCAINE METABOLITES URINE: COCAINE METABOLITES URINE: NEGATIVE

## 2017-10-21 LAB — CANNABINOIDS URINE: CANNABINOIDS URINE: POSITIVE — AB

## 2017-10-21 LAB — OPIATES URINE: OPIATES URINE: NEGATIVE

## 2017-10-21 LAB — BENZODIAZEPINES URINE: BENZODIAZEPINES URINE: NEGATIVE

## 2017-10-23 DIAGNOSIS — E039 Hypothyroidism, unspecified: Secondary | ICD-10-CM | POA: Diagnosis not present

## 2017-10-23 DIAGNOSIS — D510 Vitamin B12 deficiency anemia due to intrinsic factor deficiency: Secondary | ICD-10-CM | POA: Diagnosis not present

## 2017-10-23 DIAGNOSIS — R5382 Chronic fatigue, unspecified: Secondary | ICD-10-CM | POA: Diagnosis not present

## 2017-10-23 DIAGNOSIS — R799 Abnormal finding of blood chemistry, unspecified: Secondary | ICD-10-CM | POA: Diagnosis not present

## 2017-10-27 ENCOUNTER — Other Ambulatory Visit (HOSPITAL_BASED_OUTPATIENT_CLINIC_OR_DEPARTMENT_OTHER): Payer: Self-pay | Admitting: Internal Medicine

## 2017-10-27 ENCOUNTER — Ambulatory Visit (HOSPITAL_BASED_OUTPATIENT_CLINIC_OR_DEPARTMENT_OTHER): Payer: Commercial Managed Care - HMO | Admitting: Internal Medicine

## 2017-10-27 ENCOUNTER — Ambulatory Visit: Payer: 59 | Attending: Internal Medicine | Admitting: Internal Medicine

## 2017-10-27 ENCOUNTER — Telehealth (HOSPITAL_BASED_OUTPATIENT_CLINIC_OR_DEPARTMENT_OTHER): Payer: Self-pay | Admitting: Internal Medicine

## 2017-10-27 DIAGNOSIS — F1120 Opioid dependence, uncomplicated: Secondary | ICD-10-CM | POA: Diagnosis not present

## 2017-10-27 MED ORDER — BUPRENORPHINE HCL-NALOXONE HCL 8-2 MG SL FILM: 2 | Film | Freq: Every day | SUBLINGUAL | 0 refills | 0 days | Status: AC

## 2017-10-27 MED ORDER — BUPRENORPHINE HCL-NALOXONE HCL 8-2 MG SL FILM
2.0000 | ORAL_FILM | Freq: Every day | SUBLINGUAL | 0 refills | Status: DC
Start: 2017-11-10 — End: 2017-12-01

## 2017-10-27 MED ORDER — BUPRENORPHINE HCL-NALOXONE HCL 8-2 MG SL FILM: 2.0000 | ORAL_FILM | Freq: Every day | SUBLINGUAL | 0 refills | Status: DC

## 2017-10-27 MED ORDER — BUPRENORPHINE HCL-NALOXONE HCL 8-2 MG SL FILM: 2 | Film | Freq: Every day | SUBLINGUAL | 0 refills | 0 days | Status: DC

## 2017-10-27 NOTE — Telephone Encounter (Signed)
-----   Message from Claudia Desanctis sent at 10/21/2017  9:54 AM EDT -----  Regarding: PA - TDAP      PA - TDAP

## 2017-10-27 NOTE — Progress Notes (Signed)
Cheryl Dixon is a 62 year old female seen in follow up for opioid dependence:    Buprenorphine dose: 16 mg  Response, adequacy of dose: good  Relapses/close calls: none  Trigger: n/a  Meetings: no    Social history/events:  Son helping out getting HS kids tuxedos  Gets a good feeling out of helping others    Present Medications:    Current Outpatient Medications on File Prior to Visit:  [DISCONTINUED] buprenorphine-naloxone (SUBOXONE) 8-2 MG sublingual film Place 2 Film under the tongue daily Take one and one-half films under the tongue daily Max Daily Amount: 2 Film for 7 days Disp: 14 Film Rfl: 0   nicotine (NICOTINE STEP 2) 14 MG/24HR Place 1 patch onto the skin daily Disp: 28 patch Rfl: 1   buPROPion (WELLBUTRIN SR) 200 MG 12 hr tablet Take 1 tablet by mouth 2 (two) times daily Disp: 60 tablet Rfl: 2   polyethylene glycol (MIRALAX) powder Take 17 g by mouth daily Mix in 64 ounces of clear liquid and take as directed Disp: 500 g Rfl: 5   INCRUSE ELLIPTA 62.5 MCG/INH inhaler INHALE 1 PUFF BY MOUTH INTO LUNGS DAILY Disp: 1 Inhaler Rfl: 11   albuterol (PROVENTIL HFA,VENTOLIN HFA, PROAIR HFA) 108 (90 Base) MCG/ACT inhaler Inhale 2 puffs into the lungs every 6 (six) hours as needed for Wheezing or Shortness of breath Disp: 1 Inhaler Rfl: 2   naproxen (NAPROSYN) 500 MG tablet Take 1 tablet by mouth 2 (two) times daily with meals for 15 days Disp: 30 tablet Rfl: 0   docusate sodium (COLACE) 100 MG capsule Take 1 capsule by mouth 2 (two) times daily Disp: 60 capsule Rfl: 11   ipratropium-albuterol (DUO-NEB) 0.5-2.5 (3) MG/3ML SOLN Inhalation Solution Take 3 mLs by nebulization 4 (four) times daily. Disp: 180 mL Rfl: 0     No current facility-administered medications on file prior to visit.     Review of Systems:  Neurological exam: negative  PHQ-9 TOTAL SCORE 03/03/2017 01/05/2017 11/27/2016   Doc FlowSheet Total Score - - -   Doc FlowSheet Total Score 4 22 18            PHYSICAL EXAMINATION:  General appearance - healthy  female in no distress  Eyes - pupils 3 mm  Skin - warm and dry  Neuro - nonfocal  Affect - normal    PMP reviewed.  No unauthorized prescriptions since last refill.    Assessment:  Opioid Dependence  Comment: condition gradually improving on current dose, actively participating in group.  Plan:  Continue buprenorphine, reviewed criteria for tapering when patient is ready.   Patient is counseled regarding relapse prevention, involvement in recovery groups, potential side effects of buprenorphine, and eventual taper of medication.    We discussed the patients current medications. The patient expressed understanding and no barriers to adherence were identified.  1. The patient indicates understanding of these issues and agrees with the plan.  Brief care plan is updated and reviewed with the patient.   2. The patient is given an After Visit Summary sheet that lists all medications with directions, allergies, orders placed during this encounter, and follow-up instructions.   3. I reviewed the patient's medical information and medical history   4. I reconciled the patient's medication list and prepared and supplied needed refills.   5. I have reviewed the past medical, family, and social history sections including the medications and allergies.    [name], MD

## 2017-10-27 NOTE — Progress Notes (Signed)
Nurse from [facility] called the [facility] to complete a benefit analysis for the TDAP Vaccine.     The vaccine is covered under the patient’s [place] medical coverage.     Please choose Private

## 2017-10-28 LAB — OXYCODONE SCREEN URINE
OXYCOD SCRN URINE: NEGATIVE
OXYCOD UR SPEC GRAV: 1.015 (ref 1.003–1.035)
OXYCOD URINE CREAT: 100 md/dL
OXYCOD URINE PH: 6 (ref 4.5–8.5)

## 2017-10-28 LAB — BENZODIAZEPINES URINE: BENZODIAZEPINES URINE: NEGATIVE

## 2017-10-28 LAB — COCAINE METABOLITES URINE: COCAINE METABOLITES URINE: NEGATIVE

## 2017-10-28 LAB — ETHANOL URINE: ETHANOL URINE: NEGATIVE

## 2017-10-28 LAB — OPIATES URINE: OPIATES URINE: NEGATIVE

## 2017-10-28 LAB — BUPRENORPHINE SCREEN URINE: BUPRENORPHINE SCREEN URINE: POSITIVE

## 2017-11-03 ENCOUNTER — Ambulatory Visit (HOSPITAL_BASED_OUTPATIENT_CLINIC_OR_DEPARTMENT_OTHER): Payer: Commercial Managed Care - HMO | Admitting: Internal Medicine

## 2017-11-04 ENCOUNTER — Telehealth: Payer: Self-pay | Admitting: *Deleted

## 2017-11-04 NOTE — Telephone Encounter (Signed)
I have cancelled the pt's post operative appointment that was scheduled.

## 2017-11-04 NOTE — Telephone Encounter (Signed)
Patient came in today to see Marcy Siren, RN.  Marcy Siren brought the patient to me. "I want to cancel my surgery.  I am not feeling well.  I have several health issues going on.  I want to postpone it.  I also do not want to have it done at [facility]."  Is there a particular reason why you don't want to have it there?  "I just don't feel comfortable having it there.  They are communicating with me through text message.  I don't like that.  It is so im-personable.  I don't think it's professional."  In the medical passport that you filled out on-line has a question on it that asks how you would like to communicate.  It says do you want to do it via email, phone call or text.  "I must have missed that.  I still would rather not have it there."  Okay, that's fine.  I want to make you aware that Dr. Paulla Dolly does not do surgery at [facility]. He may have to refer you to one of his partners.  "Okay, I will take that into consideration.  I just had some questions about medication and some of my health issues.  It said in the passport that someone would call me.  I haven't heard from anyone."  Caren Griffins at normally call a day or two prior to your surgery date and give you your arrival time.  I know a nurse, I think Pamala Hurry, normally calls a couple of days before as well especially is she has questions about health issues or medications.  "Okay, I will take all this into consideration whenever I decide to reschedule it.  Marcy Siren said I needed to follow up in about three months."  Yes, that will be great.  Your consent form is valid for six months.  If you need any conservative treatment in the meantime, give [place] a call.  "I will thank you so much.  I just want to figure out what's going on with my body.  It seems like it's just falling apart."

## 2017-11-04 NOTE — Telephone Encounter (Signed)
Left message on pt's mobile and home phone to bring her labs again to be copied, the copier ran out of paper after two lab pages, this would allow me to complete the transaction for her with Dr. Paulla Dolly.

## 2017-11-04 NOTE — Telephone Encounter (Signed)
Pt presents to office to discuss labs and return surgery boot, that has not been worn and is a duplicate to the one she had at home. Pt states at her last visit with her doctor her BP was 180/116, her thyroid and other labs are very abnormal and she doesn't feel she is ready for surgery. I told pt I would return the boot and inform [facility]s [facility], and had pt discuss cancellation of surgery with the surgery coordinator. Pt spoke with surgery coordinator - D. Meadows. I gave the pt account information and return information [facility]. I gave copy of pt's labs to Dr. Paulla Dolly with explanation and LOV.

## 2017-11-10 ENCOUNTER — Encounter (HOSPITAL_BASED_OUTPATIENT_CLINIC_OR_DEPARTMENT_OTHER): Payer: Self-pay | Admitting: Registered Nurse

## 2017-11-10 ENCOUNTER — Ambulatory Visit: Payer: 59 | Attending: Internal Medicine | Admitting: Registered Nurse

## 2017-11-10 ENCOUNTER — Ambulatory Visit (HOSPITAL_BASED_OUTPATIENT_CLINIC_OR_DEPARTMENT_OTHER): Payer: Commercial Managed Care - HMO | Admitting: Internal Medicine

## 2017-11-10 ENCOUNTER — Telehealth: Payer: Self-pay | Admitting: *Deleted

## 2017-11-10 DIAGNOSIS — F1120 Opioid dependence, uncomplicated: Secondary | ICD-10-CM | POA: Insufficient documentation

## 2017-11-10 DIAGNOSIS — D3705 Neoplasm of uncertain behavior of pharynx: Secondary | ICD-10-CM | POA: Diagnosis not present

## 2017-11-10 LAB — ETHANOL URINE: ETHANOL URINE: NEGATIVE

## 2017-11-10 LAB — OXYCODONE SCREEN URINE
OXYCOD SCRN URINE: NEGATIVE
OXYCOD UR SPEC GRAV: 1.001 — CL (ref 1.003–1.035)
OXYCOD URINE CREAT: 56 md/dL
OXYCOD URINE PH: 6 (ref 4.5–8.5)

## 2017-11-10 LAB — COCAINE METABOLITES URINE: COCAINE METABOLITES URINE: NEGATIVE

## 2017-11-10 LAB — OPIATES URINE: OPIATES URINE: NEGATIVE

## 2017-11-10 LAB — BENZODIAZEPINES URINE: BENZODIAZEPINES URINE: NEGATIVE

## 2017-11-10 LAB — BUPRENORPHINE SCREEN URINE: BUPRENORPHINE SCREEN URINE: POSITIVE

## 2017-11-10 NOTE — Progress Notes (Signed)
Pt was seen by me both before and during group.  Actively participated in a positive way during group activity.  Denies any cravings or setbacks.  States she has been otherwise doing well.

## 2017-11-10 NOTE — Telephone Encounter (Signed)
"  I just want to touch base.  The surgical center called me this morning and asked me if I was on my way for the surgery.  When I was in there on Wednesday, I was thinking that was all I needed to do was to talk to you guys and you would take care on canceling the surgery.  So I wasn't sure if there something else I needed to do or that I neglected because when I do have my surgery and this is the only place Dr. Paulla Dolly uses I'm going to need them.  I don't want to be on some list because I didn't do what I was supposed to do.  I don't know what happened.  I can't believe someone didn't call until this morning to ask me where I was.  I'm not sure you are who I talked to last week.  I guess when I do reschedule someone will more complete instructions.  Have a blessed day."  I left her a message.  I apologized to her for not calling the facility and canceling the surgery.  I told her that I canceled it in our system but failed to call the surgery center.  I informed her that if she needs conservative treatment to give [place] a call and schedule an appointment with Dr. Paulla Dolly.

## 2017-11-11 ENCOUNTER — Other Ambulatory Visit: Payer: Self-pay

## 2017-11-11 ENCOUNTER — Ambulatory Visit: Payer: Self-pay | Admitting: Otolaryngology

## 2017-11-11 ENCOUNTER — Encounter (HOSPITAL_BASED_OUTPATIENT_CLINIC_OR_DEPARTMENT_OTHER): Payer: Self-pay | Admitting: *Deleted

## 2017-11-11 NOTE — H&P (Signed)
PREOPERATIVE H&P  Chief Complaint:  Left palate lesion  HPI: Michelle Roberts is a 62 y.o. female who presents for evaluation of a new left palate lesion. Patient has recently noted a new left palatal lesion that she can palpate with her tongue. Her dentist recommended having this removed.On exam she has a 5 mm raised nodule on the left side the palate. She is taken to the operating room at this time for excisional biopsy. There is no pain or discomfort associated with this.  Past Medical History:  Diagnosis Date  . Anxiety   . Arthritis   . Depression   . Fibromyalgia   . GERD (gastroesophageal reflux disease)   . Hyperlipidemia   . Hypertension   . Hypothyroidism    thyroidectomy  . Neoplasm of parotid gland   . Neuromuscular disorder ([place])   . Rheumatoid aortitis   . Sleep apnea    uses CPAP sometimes2-3 nights/week   Past Surgical History:  Procedure Laterality Date  . ABDOMINAL HYSTERECTOMY    . CHOLECYSTECTOMY    . HAMMER TOE SURGERY    . PAROTIDECTOMY Left 04/08/2016   Procedure: LEFT SUPERFICIAL PAROTIDECTOMY WITH FACIAL NERVE DISECTION;  Surgeon: Rozetta Nunnery, MD;  Location: [place];  Service: ENT;  Laterality: Left;  . THYROIDECTOMY     Social History   Socioeconomic History  . Marital status: Married    Spouse name: Not on file  . Number of children: Not on file  . Years of education: Not on file  . Highest education level: Not on file  Occupational History  . Not on file  Social Needs  . Financial resource strain: Not on file  . Food insecurity:    Worry: Not on file    Inability: Not on file  . Transportation needs:    Medical: Not on file    Non-medical: Not on file  Tobacco Use  . Smoking status: Never Smoker  . Smokeless tobacco: Never Used  Substance and Sexual Activity  . Alcohol use: No  . Drug use: No  . Sexual activity: Not on file  Lifestyle  . Physical activity:    Days per week: Not on file    Minutes per  session: Not on file  . Stress: Not on file  Relationships  . Social connections:    Talks on phone: Not on file    Gets together: Not on file    Attends religious service: Not on file    Active member of club or organization: Not on file    Attends meetings of clubs or organizations: Not on file    Relationship status: Not on file  Other Topics Concern  . Not on file  Social History Narrative  . Not on file   Family History  Problem Relation Age of Onset  . Alzheimer's disease Mother   . Alzheimer's disease Sister    Allergies  Allergen Reactions  . Epinephrine Anaphylaxis   Prior to Admission medications   Medication Sig Start Date End Date Taking? Authorizing Provider  amitriptyline (ELAVIL) 25 MG tablet TAKE 1 TABLET BY MOUTH AT  BEDTIME 02/12/17  Yes Deveshwar, Abel Presto, MD  Ascorbic Acid (BL VITAMIN C PO) Take by mouth daily.   Yes [provider]  atorvastatin (LIPITOR) 40 MG tablet  06/20/13  Yes [provider]  Calcium Carbonate-Vit D-Min (CALCIUM 1200 PO) Take by mouth.   Yes [provider]  cholecalciferol (VITAMIN D) 1000 UNITS tablet Take 1,000  Units by mouth daily.   Yes [provider]  Coenzyme Q10 (CO Q-10) 30 MG CAPS Take by mouth.   Yes [provider]  Estradiol-Estriol-Progesterone (BIEST/PROGESTERONE) CREA Place onto the skin daily.   Yes [provider]  FABB 2.2-25-1 MG TABS  06/26/13  Yes [provider]  FERREX 150 150 MG capsule Take 150 mg by mouth 2 (two) times daily. 12/03/16  Yes [provider]  fexofenadine (ALLEGRA) 180 MG tablet Take 180 mg by mouth daily.   Yes [provider]  gabapentin (NEURONTIN) 300 MG capsule TAKE 1 CAPSULE TWICE A DAY  AND 2 CAPSULES AT BEDTIME 06/22/17 11/11/17 Yes Deveshwar, Abel Presto, MD  JARDIANCE 10 MG TABS tablet Take 10 mg by mouth daily. 08/23/17  Yes [provider]  ketoconazole (NIZORAL) 2 % shampoo Apply 1 application topically  daily. 01/20/17  Yes [provider]  Lactobacillus (ACIDOPHILUS PO) Take by mouth.   Yes [provider]  meloxicam (MOBIC) 15 MG tablet Take 15 mg by mouth daily. 02/14/17  Yes [provider]  Multiple Vitamins-Minerals (MULTIVITAMIN ADULT PO) Take by mouth daily.   Yes [provider]  NALTREXONE HCL PO Take 4.5 mg/day by mouth daily.   Yes [provider]  Omega-3 Fatty Acids (OMEGA-3 1450 PO) Take by mouth.   Yes [provider]  pantoprazole (PROTONIX) 40 MG tablet  06/20/13  Yes [provider]  SYNTHROID 150 MCG tablet  06/26/13  Yes [provider]  Thiamine HCl (B-1) 100 MG TABS Take by mouth.   Yes [provider]  triamterene-hydrochlorothiazide (DYAZIDE) 37.5-25 MG capsule Take 1 capsule by mouth daily.   Yes [provider]  VAGIFEM 10 MCG TABS vaginal tablet  06/26/13  Yes [provider]  zolpidem (AMBIEN) 10 MG tablet  05/17/13  Yes [provider]  benzonatate (TESSALON PERLES) 100 MG capsule Take 2 capsules (200 mg total) by mouth 2 (two) times daily. 04/09/16   Rozetta Nunnery, MD  diphenhydrAMINE (BENADRYL) 25 mg capsule Take 25 mg by mouth every 6 (six) hours as needed. One hour prior to infusion    [provider]  fluconazole (DIFLUCAN) 200 MG tablet Take 200 mg by mouth daily. 09/02/17   [provider]  Jonetta Speak LANCETS 68T MISC Inject 1 strip into the skin daily. 02/09/17   [provider]  Glory Rosebush VERIO test strip Inject 1 application into the skin daily. 02/09/17   [provider]  POTASSIUM CHLORIDE PO Take by mouth daily.    [provider]  TURMERIC PO Take by mouth.    [provider]  UNABLE TO FIND [place]    [provider]     Positive ROS: negative  All other systems have been reviewed and were otherwise negative with the exception of those mentioned in the HPI and as  above.  Physical Exam: There were no vitals filed for this visit.  General: Alert, no acute distress Oral: Normal oral mucosa and tonsils. She has a large torus on the palate. To the left side there is a 5 mm raised nodule that is firm to palpation. Nontender. Nasal: Clear nasal passages Neck: No palpable adenopathy. Ear: Ear canal is clear with normal appearing TMs Cardiovascular: Regular rate and rhythm, no murmur.  Respiratory: Clear to auscultation Neurologic: Alert and oriented x 3   Assessment/Plan: soft palate lesion Plan for Procedure(s): EXCISION SOFT PALATE LESION   [name], MD 11/11/2017 1:46 PM

## 2017-11-13 ENCOUNTER — Telehealth (HOSPITAL_BASED_OUTPATIENT_CLINIC_OR_DEPARTMENT_OTHER): Payer: Self-pay | Admitting: Registered Nurse

## 2017-11-13 NOTE — Telephone Encounter (Signed)
-----   Message from [facility] sent at 11/13/2017  3:47 PM EDT -----  Regarding: Requesting Phone Call - [place]: [phone]  Apoorva Bugay [id number], 62 year old, female    Calls today:  Clinical Questions ([place])    Name of person calling Kathrin Ruddy, [place] [employer]  Specific nature of request Requesting phone call - Jackelyn Poling would like more information regarding whether or not pt needs prior authorization for medication.  Return phone number [phone]    Patient's language of care: English    Patient does not need an interpreter.    Patient's PCP: [name], MD

## 2017-11-13 NOTE — Progress Notes (Signed)
Reached pt would like to let Orbie Hurst, [place] and [name], RN that she has udpated her medicaid to [employer], RN, 11/13/2017

## 2017-11-16 ENCOUNTER — Encounter (HOSPITAL_BASED_OUTPATIENT_CLINIC_OR_DEPARTMENT_OTHER)
Admission: RE | Admit: 2017-11-16 | Discharge: 2017-11-16 | Disposition: A | Discharge status: Home / Self Care | Payer: 59 | Source: Ambulatory Visit | Attending: Otolaryngology | Admitting: Otolaryngology

## 2017-11-16 ENCOUNTER — Other Ambulatory Visit: Payer: 59

## 2017-11-16 DIAGNOSIS — G4730 Sleep apnea, unspecified: Secondary | ICD-10-CM | POA: Diagnosis not present

## 2017-11-16 DIAGNOSIS — I739 Peripheral vascular disease, unspecified: Secondary | ICD-10-CM | POA: Diagnosis not present

## 2017-11-16 DIAGNOSIS — F329 Major depressive disorder, single episode, unspecified: Secondary | ICD-10-CM | POA: Diagnosis not present

## 2017-11-16 DIAGNOSIS — M069 Rheumatoid arthritis, unspecified: Secondary | ICD-10-CM | POA: Diagnosis not present

## 2017-11-16 DIAGNOSIS — M797 Fibromyalgia: Secondary | ICD-10-CM | POA: Diagnosis not present

## 2017-11-16 DIAGNOSIS — E039 Hypothyroidism, unspecified: Secondary | ICD-10-CM | POA: Diagnosis not present

## 2017-11-16 DIAGNOSIS — D1039 Benign neoplasm of other parts of mouth: Secondary | ICD-10-CM | POA: Diagnosis not present

## 2017-11-16 DIAGNOSIS — Z888 Allergy status to other drugs, medicaments and biological substances status: Secondary | ICD-10-CM | POA: Diagnosis not present

## 2017-11-16 DIAGNOSIS — K1379 Other lesions of oral mucosa: Secondary | ICD-10-CM | POA: Diagnosis present

## 2017-11-16 DIAGNOSIS — Z87892 Personal history of anaphylaxis: Secondary | ICD-10-CM | POA: Diagnosis not present

## 2017-11-16 DIAGNOSIS — Z79899 Other long term (current) drug therapy: Secondary | ICD-10-CM | POA: Diagnosis not present

## 2017-11-16 DIAGNOSIS — Z791 Long term (current) use of non-steroidal anti-inflammatories (NSAID): Secondary | ICD-10-CM | POA: Diagnosis not present

## 2017-11-16 DIAGNOSIS — M270 Developmental disorders of jaws: Secondary | ICD-10-CM | POA: Diagnosis not present

## 2017-11-16 DIAGNOSIS — E785 Hyperlipidemia, unspecified: Secondary | ICD-10-CM | POA: Diagnosis not present

## 2017-11-16 DIAGNOSIS — I10 Essential (primary) hypertension: Secondary | ICD-10-CM | POA: Diagnosis not present

## 2017-11-16 DIAGNOSIS — K219 Gastro-esophageal reflux disease without esophagitis: Secondary | ICD-10-CM | POA: Diagnosis not present

## 2017-11-16 DIAGNOSIS — Z79890 Hormone replacement therapy (postmenopausal): Secondary | ICD-10-CM | POA: Diagnosis not present

## 2017-11-16 LAB — BASIC METABOLIC PANEL
Anion gap: 9 (ref 5–15)
BUN: 18 mg/dL (ref 6–20)
CO2: 28 mmol/L (ref 22–32)
Calcium: 9 mg/dL (ref 8.9–10.3)
Chloride: 101 mmol/L (ref 98–111)
Creatinine, Ser: 1.13 mg/dL — ABNORMAL HIGH (ref 0.44–1.00)
GFR calc Af Amer: 60 mL/min — ABNORMAL LOW (ref >60)
GFR calc non Af Amer: 52 mL/min — ABNORMAL LOW (ref >60)
Glucose, Bld: 121 mg/dL — ABNORMAL HIGH (ref 70–99)
Potassium: 4.3 mmol/L (ref 3.5–5.1)
Sodium: 138 mmol/L (ref 135–145)

## 2017-11-17 ENCOUNTER — Ambulatory Visit (HOSPITAL_BASED_OUTPATIENT_CLINIC_OR_DEPARTMENT_OTHER): Payer: Commercial Managed Care - HMO | Admitting: Internal Medicine

## 2017-11-17 ENCOUNTER — Other Ambulatory Visit: Payer: Self-pay

## 2017-11-17 ENCOUNTER — Encounter (HOSPITAL_BASED_OUTPATIENT_CLINIC_OR_DEPARTMENT_OTHER): Payer: Self-pay

## 2017-11-17 ENCOUNTER — Encounter (HOSPITAL_BASED_OUTPATIENT_CLINIC_OR_DEPARTMENT_OTHER)
Admission: RE | Disposition: A | Discharge status: Home / Self Care | Payer: Self-pay | Source: Ambulatory Visit | Attending: Otolaryngology

## 2017-11-17 ENCOUNTER — Ambulatory Visit (HOSPITAL_BASED_OUTPATIENT_CLINIC_OR_DEPARTMENT_OTHER): Payer: 59 | Admitting: Anesthesiology

## 2017-11-17 ENCOUNTER — Ambulatory Visit (HOSPITAL_BASED_OUTPATIENT_CLINIC_OR_DEPARTMENT_OTHER)
Admission: RE | Admit: 2017-11-17 | Discharge: 2017-11-17 | Disposition: A | Discharge status: Home / Self Care | Payer: 59 | Source: Ambulatory Visit | Attending: Otolaryngology | Admitting: Otolaryngology

## 2017-11-17 DIAGNOSIS — F329 Major depressive disorder, single episode, unspecified: Secondary | ICD-10-CM | POA: Insufficient documentation

## 2017-11-17 DIAGNOSIS — K219 Gastro-esophageal reflux disease without esophagitis: Secondary | ICD-10-CM | POA: Insufficient documentation

## 2017-11-17 DIAGNOSIS — Z87892 Personal history of anaphylaxis: Secondary | ICD-10-CM | POA: Insufficient documentation

## 2017-11-17 DIAGNOSIS — I10 Essential (primary) hypertension: Secondary | ICD-10-CM | POA: Diagnosis not present

## 2017-11-17 DIAGNOSIS — K1379 Other lesions of oral mucosa: Secondary | ICD-10-CM | POA: Diagnosis not present

## 2017-11-17 DIAGNOSIS — Z791 Long term (current) use of non-steroidal anti-inflammatories (NSAID): Secondary | ICD-10-CM | POA: Insufficient documentation

## 2017-11-17 DIAGNOSIS — E039 Hypothyroidism, unspecified: Secondary | ICD-10-CM | POA: Insufficient documentation

## 2017-11-17 DIAGNOSIS — M069 Rheumatoid arthritis, unspecified: Secondary | ICD-10-CM | POA: Diagnosis not present

## 2017-11-17 DIAGNOSIS — Z888 Allergy status to other drugs, medicaments and biological substances status: Secondary | ICD-10-CM | POA: Insufficient documentation

## 2017-11-17 DIAGNOSIS — M270 Developmental disorders of jaws: Secondary | ICD-10-CM | POA: Diagnosis not present

## 2017-11-17 DIAGNOSIS — E785 Hyperlipidemia, unspecified: Secondary | ICD-10-CM | POA: Insufficient documentation

## 2017-11-17 DIAGNOSIS — I739 Peripheral vascular disease, unspecified: Secondary | ICD-10-CM | POA: Insufficient documentation

## 2017-11-17 DIAGNOSIS — Z79899 Other long term (current) drug therapy: Secondary | ICD-10-CM | POA: Insufficient documentation

## 2017-11-17 DIAGNOSIS — M797 Fibromyalgia: Secondary | ICD-10-CM | POA: Insufficient documentation

## 2017-11-17 DIAGNOSIS — D1039 Benign neoplasm of other parts of mouth: Secondary | ICD-10-CM | POA: Insufficient documentation

## 2017-11-17 DIAGNOSIS — Z79890 Hormone replacement therapy (postmenopausal): Secondary | ICD-10-CM | POA: Insufficient documentation

## 2017-11-17 DIAGNOSIS — G4730 Sleep apnea, unspecified: Secondary | ICD-10-CM | POA: Insufficient documentation

## 2017-11-17 HISTORY — PX: MASS EXCISION: SHX2000

## 2017-11-17 LAB — GLUCOSE, CAPILLARY
Glucose-Capillary: 98 mg/dL (ref 70–99)
Glucose-Capillary: 135 mg/dL — ABNORMAL HIGH (ref 70–99)

## 2017-11-17 SURGERY — EXCISION MASS
Anesthesia: Monitor Anesthesia Care

## 2017-11-17 MED ORDER — BUTAMBEN-TETRACAINE-BENZOCAINE 2-2-14 % EX AERO
INHALATION_SPRAY | CUTANEOUS | Status: DC | PRN
  Administered 2017-11-17: 1 via TOPICAL

## 2017-11-17 MED ORDER — FENTANYL CITRATE (PF) 100 MCG/2ML IJ SOLN: 50.0000 ug | INTRAMUSCULAR | Status: DC | PRN

## 2017-11-17 MED ORDER — SILVER NITRATE-POT NITRATE 75-25 % EX MISC
CUTANEOUS | Status: AC
  Filled 2017-11-17: qty 1

## 2017-11-17 MED ORDER — SILVER NITRATE-POT NITRATE 75-25 % EX MISC
CUTANEOUS | Status: DC | PRN
  Administered 2017-11-17: 1

## 2017-11-17 MED ORDER — BACITRACIN ZINC 500 UNIT/GM EX OINT
TOPICAL_OINTMENT | CUTANEOUS | Status: AC
  Filled 2017-11-17: qty 1.8

## 2017-11-17 MED ORDER — MIDAZOLAM HCL 2 MG/2ML IJ SOLN
1.0000 mg | INTRAMUSCULAR | Status: DC | PRN
  Administered 2017-11-17 (×2): 1 mg via INTRAVENOUS

## 2017-11-17 MED ORDER — LIDOCAINE-EPINEPHRINE 1 %-1:100000 IJ SOLN
INTRAMUSCULAR | Status: AC
  Filled 2017-11-17: qty 1

## 2017-11-17 MED ORDER — CHLORHEXIDINE GLUCONATE CLOTH 2 % EX PADS: 6.0000 | MEDICATED_PAD | Freq: Once | CUTANEOUS | Status: DC

## 2017-11-17 MED ORDER — CEFAZOLIN SODIUM-DEXTROSE 2-4 GM/100ML-% IV SOLN
INTRAVENOUS | Status: AC
  Filled 2017-11-17: qty 100

## 2017-11-17 MED ORDER — LIDOCAINE-EPINEPHRINE 1 %-1:100000 IJ SOLN
INTRAMUSCULAR | Status: DC | PRN
  Administered 2017-11-17: 1.5 mL

## 2017-11-17 MED ORDER — MIDAZOLAM HCL 2 MG/2ML IJ SOLN
INTRAMUSCULAR | Status: AC
  Filled 2017-11-17: qty 2

## 2017-11-17 MED ORDER — LIDOCAINE HCL (PF) 1 % IJ SOLN
INTRAMUSCULAR | Status: AC
  Filled 2017-11-17: qty 30

## 2017-11-17 MED ORDER — OXYMETAZOLINE HCL 0.05 % NA SOLN
NASAL | Status: AC
  Filled 2017-11-17: qty 15

## 2017-11-17 MED ORDER — LACTATED RINGERS IV SOLN
INTRAVENOUS | Status: DC
  Administered 2017-11-17: 09:00:00 via INTRAVENOUS

## 2017-11-17 MED ORDER — CEFAZOLIN SODIUM-DEXTROSE 2-4 GM/100ML-% IV SOLN
2.0000 g | INTRAVENOUS | Status: AC
  Administered 2017-11-17: 2 g via INTRAVENOUS

## 2017-11-17 MED ORDER — SCOPOLAMINE 1 MG/3DAYS TD PT72: 1.0000 | MEDICATED_PATCH | Freq: Once | TRANSDERMAL | Status: DC | PRN

## 2017-11-17 SURGICAL SUPPLY — 38 items
BLADE EAR TYMPAN 2.5 60D BEAV (BLADE) ×2 IMPLANT
BLADE SURG 15 STRL LF DISP TIS (BLADE) ×1 IMPLANT
BLADE SURG 15 STRL SS (BLADE) ×3
CANISTER SUCT 1200ML W/VALVE (MISCELLANEOUS) ×3 IMPLANT
CLEANER CAUTERY TIP 5X5 PAD (MISCELLANEOUS) IMPLANT
COAGULATOR SUCT 8FR VV (MISCELLANEOUS) ×2 IMPLANT
CORD BIPOLAR FORCEPS 12FT (ELECTRODE) IMPLANT
COVER BACK TABLE 60X90IN (DRAPES) ×3 IMPLANT
COVER MAYO STAND STRL (DRAPES) ×3 IMPLANT
ELECT COATED BLADE 2.86 ST (ELECTRODE) ×1 IMPLANT
ELECT REM PT RETURN 9FT ADLT (ELECTROSURGICAL) ×3
ELECTRODE REM PT RTRN 9FT ADLT (ELECTROSURGICAL) ×1 IMPLANT
GAUZE SPONGE 4X4 12PLY STRL LF (GAUZE/BANDAGES/DRESSINGS) ×1 IMPLANT
GLOVE BIO SURGEON STRL SZ 6.5 (GLOVE) ×2 IMPLANT
GLOVE BIO SURGEONS STRL SZ 6.5 (GLOVE) ×2
GLOVE SS BIOGEL STRL SZ 7.5 (GLOVE) ×1 IMPLANT
GLOVE SUPERSENSE BIOGEL SZ 7.5 (GLOVE) ×2
GOWN STRL REUS W/ TWL LRG LVL3 (GOWN DISPOSABLE) ×2 IMPLANT
GOWN STRL REUS W/TWL LRG LVL3 (GOWN DISPOSABLE) ×6
MARKER SKIN DUAL TIP RULER LAB (MISCELLANEOUS) IMPLANT
NDL PRECISIONGLIDE 27X1.5 (NEEDLE) ×1 IMPLANT
NEEDLE PRECISIONGLIDE 27X1.5 (NEEDLE) ×3 IMPLANT
NS IRRIG 1000ML POUR BTL (IV SOLUTION) ×2 IMPLANT
PACK BASIN DAY SURGERY FS (CUSTOM PROCEDURE TRAY) ×3 IMPLANT
PAD CLEANER CAUTERY TIP 5X5 (MISCELLANEOUS)
PENCIL FOOT CONTROL (ELECTRODE) ×1 IMPLANT
SHEET MEDIUM DRAPE 40X70 STRL (DRAPES) ×3 IMPLANT
SUT CHROMIC 3 0 SH 27 (SUTURE) IMPLANT
SUT CHROMIC 4 0 PS 2 18 (SUTURE) IMPLANT
SUT CHROMIC 5 0 P 3 (SUTURE) IMPLANT
SUT VIC AB 5-0 P-3 18X BRD (SUTURE) IMPLANT
SUT VIC AB 5-0 P3 18 (SUTURE)
SUT VIC AB 5-0 PC1 18 (SUTURE) IMPLANT
SYR BULB 3OZ (MISCELLANEOUS) ×1 IMPLANT
SYR CONTROL 10ML LL (SYRINGE) ×3 IMPLANT
TOWEL GREEN STERILE FF (TOWEL DISPOSABLE) ×3 IMPLANT
TUBE CONNECTING 20'X1/4 (TUBING) ×1
TUBE CONNECTING 20X1/4 (TUBING) ×2 IMPLANT

## 2017-11-17 NOTE — Discharge Instructions (Addendum)
Tylenol or ibuprofen prn pain Can also use topical anesthetic spray as needed or throat lozenges for sore throat Call office for follow up in 7-10 days You can call office on Friday between 4:30 and 5 to discuss results of pathology Diet as tolerated but rinse mouth with water after eating    Post Anesthesia Home Care Instructions  Activity: Get plenty of rest for the remainder of the day. A responsible individual must stay with you for 24 hours following the procedure.  For the next 24 hours, DO NOT: -Drive a car -Paediatric nurse -Drink alcoholic beverages -Take any medication unless instructed by your physician -Make any legal decisions or sign important papers.  Meals: Start with liquid foods such as gelatin or soup. Progress to regular foods as tolerated. Avoid greasy, spicy, heavy foods. If nausea and/or vomiting occur, drink only clear liquids until the nausea and/or vomiting subsides. Call your physician if vomiting continues.  Special Instructions/Symptoms: Your throat may feel dry or sore from the anesthesia or the breathing tube placed in your throat during surgery. If this causes discomfort, gargle with warm salt water. The discomfort should disappear within 24 hours.  If you had a scopolamine patch placed behind your ear for the management of post- operative nausea and/or vomiting:  1. The medication in the patch is effective for 72 hours, after which it should be removed.  Wrap patch in a tissue and discard in the trash. Wash hands thoroughly with soap and water. 2. You may remove the patch earlier than 72 hours if you experience unpleasant side effects which may include dry mouth, dizziness or visual disturbances. 3. Avoid touching the patch. Wash your hands with soap and water after contact with the patch.  Post Anesthesia Home Care Instructions  Activity: Get plenty of rest for the remainder of the day. A responsible individual must stay with you for 24 hours  following the procedure.  For the next 24 hours, DO NOT: -Drive a car -Paediatric nurse -Drink alcoholic beverages -Take any medication unless instructed by your physician -Make any legal decisions or sign important papers.  Meals: Start with liquid foods such as gelatin or soup. Progress to regular foods as tolerated. Avoid greasy, spicy, heavy foods. If nausea and/or vomiting occur, drink only clear liquids until the nausea and/or vomiting subsides. Call your physician if vomiting continues.  Special Instructions/Symptoms: Your throat may feel dry or sore from the anesthesia or the breathing tube placed in your throat during surgery. If this causes discomfort, gargle with warm salt water. The discomfort should disappear within 24 hours.  If you had a scopolamine patch placed behind your ear for the management of post- operative nausea and/or vomiting:  1. The medication in the patch is effective for 72 hours, after which it should be removed.  Wrap patch in a tissue and discard in the trash. Wash hands thoroughly with soap and water. 2. You may remove the patch earlier than 72 hours if you experience unpleasant side effects which may include dry mouth, dizziness or visual disturbances. 3. Avoid touching the patch. Wash your hands with soap and water after contact with the patch.

## 2017-11-17 NOTE — Interval H&P Note (Signed)
History and Physical Interval Note:  11/17/2017 7:35 AM  Michelle Roberts  has presented today for surgery, with the diagnosis of soft palate lesion  The various methods of treatment have been discussed with the patient and family. After consideration of risks, benefits and other options for treatment, the patient has consented to  Procedure(s): EXCISION SOFT PALATE LESION (N/A) as a surgical intervention .  The patient's history has been reviewed, patient examined, no change in status, stable for surgery.  I have reviewed the patient's chart and labs.  Questions were answered to the patient's satisfaction.     [name]

## 2017-11-17 NOTE — Brief Op Note (Signed)
11/17/2017  9:35 AM  PATIENT:  Cherlyn Cushing  62 y.o. female  PRE-OPERATIVE DIAGNOSIS:  hard palate lesion  POST-OPERATIVE DIAGNOSIS:  hard palate lesion  PROCEDURE:  Procedure(s): EXCISION SOFT PALATE LESION (N/A)  SURGEON:  Surgeon(s) and Role:    Rozetta Nunnery, MD - Primary  PHYSICIAN ASSISTANT:   ASSISTANTS: none   ANESTHESIA:   MAC  EBL:  minimal   BLOOD ADMINISTERED:none  DRAINS: none   LOCAL MEDICATIONS USED:  XYLOCAINE with EPI  1.5 cc  SPECIMEN:  Source of Specimen:  left hard palate  DISPOSITION OF SPECIMEN:  PATHOLOGY  COUNTS:  YES  TOURNIQUET:  * No tourniquets in log *  DICTATION: .Other Dictation: Dictation Number [id number]  PLAN OF CARE: Discharge to home after PACU  PATIENT DISPOSITION:  PACU - hemodynamically stable.   Delay start of Pharmacological VTE agent (>24hrs) due to surgical blood loss or risk of bleeding: yes

## 2017-11-17 NOTE — Anesthesia Preprocedure Evaluation (Addendum)
Anesthesia Evaluation  Patient identified by MRN, date of birth, ID band Patient awake    Reviewed: Allergy & Precautions, NPO status , Patient's Chart, lab work & pertinent test results  Airway Mallampati: II  TM Distance: >3 FB     Dental   Pulmonary sleep apnea ,    breath sounds clear to auscultation       Cardiovascular hypertension, + Peripheral Vascular Disease   Rhythm:Regular Rate:Normal     Neuro/Psych    GI/Hepatic Neg liver ROS, GERD  ,  Endo/Other  Hypothyroidism   Renal/GU negative Renal ROS     Musculoskeletal  (+) Arthritis , Fibromyalgia -  Abdominal   Peds  Hematology   Anesthesia Other Findings   Reproductive/Obstetrics                            Anesthesia Physical Anesthesia Plan  ASA: III  Anesthesia Plan: MAC   Post-op Pain Management:    Induction: Intravenous  PONV Risk Score and Plan: Treatment may vary due to age or medical condition  Airway Management Planned: Nasal Cannula and Simple Face Mask  Additional Equipment:   Intra-op Plan:   Post-operative Plan:   Informed Consent: I have reviewed the patients History and Physical, chart, labs and discussed the procedure including the risks, benefits and alternatives for the proposed anesthesia with the patient or authorized representative who has indicated his/her understanding and acceptance.   Dental advisory given  Plan Discussed with: CRNA and Anesthesiologist  Anesthesia Plan Comments:         Anesthesia Quick Evaluation

## 2017-11-17 NOTE — Transfer of Care (Signed)
Immediate Anesthesia Transfer of Care Note  Patient: Michelle Roberts  Procedure(s) Performed: EXCISION SOFT PALATE LESION (N/A )  Patient Location: PACU  Anesthesia Type:MAC  Level of Consciousness: awake  Airway & Oxygen Therapy: Patient Spontanous Breathing  Post-op Assessment: Report given to RN  Post vital signs: Reviewed  Last Vitals:  Vitals Value Taken Time  BP    Temp    Pulse    Resp 11 11/17/2017  9:37 AM  SpO2    Vitals shown include unvalidated device data.  Last Pain:  Vitals:   11/17/17 0740  TempSrc: Oral  PainSc:          Complications: No apparent anesthesia complications

## 2017-11-17 NOTE — Anesthesia Postprocedure Evaluation (Signed)
Anesthesia Post Note  Patient: Michelle Roberts  Procedure(s) Performed: EXCISION SOFT PALATE LESION (N/A )     Patient location during evaluation: PACU Anesthesia Type: MAC and General Level of consciousness: awake Pain management: pain level controlled Vital Signs Assessment: post-procedure vital signs reviewed and stable Respiratory status: spontaneous breathing Cardiovascular status: stable Anesthetic complications: no    Last Vitals:  Vitals:   11/17/17 1000 11/17/17 1015  BP: (!) 131/96 (!) 155/86  Pulse: 86 79  Resp: 18 16  Temp:  (!) 36.1 C  SpO2: 100% 100%    Last Pain:  Vitals:   11/17/17 1015  TempSrc:   PainSc: 0-No pain                 Ragan Duhon

## 2017-11-17 NOTE — Op Note (Signed)
NAME: Michelle Roberts, Michelle Roberts MEDICAL RECORD [id number] ACCOUNT [id number] DATE OF BIRTH:Mar 29, 1956 FACILITY: [facility] LOCATION: [place], [place]  OPERATIVE REPORT  DATE OF PROCEDURE:  11/17/2017  PREOPERATIVE DIAGNOSIS:  Left hard palate lesion.  POSTOPERATIVE DIAGNOSIS:  Left hard palate lesion.  OPERATION PERFORMED:  Excision of left hard palate lesion.  SURGEON:  [name], MD.  ANESTHESIA:  MAC with 1.5 mL of Xylocaine with epinephrine.  ESTIMATED BLOOD LOSS:  Minimal.  COMPLICATIONS:  None.  BRIEF CLINICAL NOTE:  The patient is a 62 year old female who was recently noted to have a lesion on the left hard palate identified by her dentist who felt like this needed to be excised.  On exam, she has a 5-6 mm raised nodule on the left side of the  hard palate.  She has a large torus palatini and central location, but this is located more on the lateral portion of the hard palate and separate from the torus.  It has been nontender.  She has felt it with her tongue fairly easily.  She is taken to  the operating room at this time for excision under local anesthetic.  DESCRIPTION OF PROCEDURE:  The patient apparently required MAC anesthesia and she received some slight IV sedation.  Her neck was extended back.  The palate area was sprayed with topical Cetacaine and then the area of the nodule was injected with about a  1.5 mL of Xylocaine with 1:100,000 epinephrine.  The small nodular lesion was excised with 1-2 mm margins around the nodule.  Specimen was removed and sent to pathology.  Hemostasis was obtained with suction cautery as well as silver nitrate.  After  obtaining adequate hemostasis, the procedure was completed.  The patient was subsequently transferred to recovery room postop doing well.  DISPOSITION:  She is discharged home later this morning and instructed to take Tylenol or ibuprofen for pain and was given the Cetacaine spray to use as a  topical anesthetic if needed.  She will follow up in office in 7-10 days for recheck to review  final pathology and recheck wound.  TN/NUANCE  D:11/17/2017 T:11/17/2017 JOB:[id number]

## 2017-11-18 ENCOUNTER — Encounter (HOSPITAL_BASED_OUTPATIENT_CLINIC_OR_DEPARTMENT_OTHER): Payer: Self-pay | Admitting: Otolaryngology

## 2017-11-24 ENCOUNTER — Telehealth (HOSPITAL_BASED_OUTPATIENT_CLINIC_OR_DEPARTMENT_OTHER): Payer: Self-pay

## 2017-11-24 ENCOUNTER — Ambulatory Visit (HOSPITAL_BASED_OUTPATIENT_CLINIC_OR_DEPARTMENT_OTHER): Payer: Commercial Managed Care - HMO | Admitting: Internal Medicine

## 2017-11-24 NOTE — Telephone Encounter (Signed)
-----   Message from [facility] sent at 11/24/2017  1:45 PM EDT -----  Regarding: Requesting Phone Call - MEDS  Contact: [phone]  Cheryl Dixon [id number], 62 year old, female    Calls today:  Clinical Questions ([place])    Name of person calling Patient  Specific nature of request Requesting phone call - pt would like to make sure she is still going to receive her medication although her GROUP session was cancelled.   Return phone number [phone]    Patient's language of care: English    Patient does not need an interpreter.    Patient's PCP: [name], MD

## 2017-11-24 NOTE — Progress Notes (Signed)
Returned call to patient staes she was told group was canceled \  Explained group wasn't canceled but you do not have group today not again till 8/6 . Patient stated she was due a script explained to patient last script was date to fill on 7/16 and was for 3 weeks-21 days  42 films since patient takes 2 per day .patietn daid ok just checking

## 2017-11-28 DIAGNOSIS — K1379 Other lesions of oral mucosa: Secondary | ICD-10-CM | POA: Diagnosis not present

## 2017-12-01 ENCOUNTER — Other Ambulatory Visit (HOSPITAL_BASED_OUTPATIENT_CLINIC_OR_DEPARTMENT_OTHER): Payer: Self-pay | Admitting: Internal Medicine

## 2017-12-01 ENCOUNTER — Ambulatory Visit (HOSPITAL_BASED_OUTPATIENT_CLINIC_OR_DEPARTMENT_OTHER): Payer: Commercial Managed Care - HMO | Admitting: Internal Medicine

## 2017-12-01 ENCOUNTER — Ambulatory Visit (HOSPITAL_BASED_OUTPATIENT_CLINIC_OR_DEPARTMENT_OTHER): Payer: Self-pay | Admitting: Internal Medicine

## 2017-12-01 DIAGNOSIS — F1120 Opioid dependence, uncomplicated: Secondary | ICD-10-CM

## 2017-12-01 MED ORDER — BUPRENORPHINE HCL-NALOXONE HCL 8-2 MG SL FILM
2.0000 | ORAL_FILM | Freq: Every day | SUBLINGUAL | 0 refills | Status: DC
Start: 2017-12-01 — End: 2017-12-15

## 2017-12-01 MED ORDER — BUPRENORPHINE HCL-NALOXONE HCL 8-2 MG SL FILM
2.0000 | ORAL_FILM | Freq: Every day | SUBLINGUAL | 0 refills | Status: DC
Start: 2017-12-01 — End: 2017-12-01

## 2017-12-01 MED ORDER — BUPRENORPHINE HCL-NALOXONE HCL 8-2 MG SL FILM: 2 | Film | Freq: Every day | SUBLINGUAL | 0 refills | 0 days | Status: DC

## 2017-12-01 NOTE — Addendum Note (Signed)
Addended by: Nolia Tschantz on: 12/01/2017 08:56 AM     Modules accepted: Orders

## 2017-12-02 ENCOUNTER — Ambulatory Visit: Payer: 59 | Attending: Internal Medicine

## 2017-12-02 DIAGNOSIS — F1120 Opioid dependence, uncomplicated: Secondary | ICD-10-CM | POA: Diagnosis present

## 2017-12-02 LAB — FENTANYL URINE: FENTANYL URINE: NEGATIVE

## 2017-12-02 LAB — COCAINE METABOLITES URINE: COCAINE METABOLITES URINE: NEGATIVE

## 2017-12-02 LAB — METHADONE URINE: METHADONE URINE: NEGATIVE

## 2017-12-02 LAB — CANNABINOIDS URINE: CANNABINOIDS URINE: POSITIVE — AB

## 2017-12-02 LAB — BENZODIAZEPINES URINE: BENZODIAZEPINES URINE: NEGATIVE

## 2017-12-02 LAB — OXYCODONE SCREEN URINE
OXYCOD SCRN URINE: NEGATIVE
OXYCOD UR SPEC GRAV: 1.01 (ref 1.003–1.035)
OXYCOD URINE CREAT: 98 md/dL
OXYCOD URINE PH: 6 (ref 4.5–8.5)

## 2017-12-02 LAB — ETHANOL URINE: ETHANOL URINE: NEGATIVE

## 2017-12-02 LAB — OPIATES URINE: OPIATES URINE: NEGATIVE

## 2017-12-02 LAB — BUPRENORPHINE SCREEN URINE: BUPRENORPHINE SCREEN URINE: POSITIVE

## 2017-12-02 LAB — AMPHETAMINES URINE: AMPHETAMINES URINE: NEGATIVE

## 2017-12-02 NOTE — Progress Notes (Signed)
Urine collected.

## 2017-12-04 DIAGNOSIS — E039 Hypothyroidism, unspecified: Secondary | ICD-10-CM | POA: Diagnosis not present

## 2017-12-08 ENCOUNTER — Ambulatory Visit (HOSPITAL_BASED_OUTPATIENT_CLINIC_OR_DEPARTMENT_OTHER): Payer: Commercial Managed Care - HMO | Admitting: Internal Medicine

## 2017-12-11 ENCOUNTER — Telehealth (HOSPITAL_BASED_OUTPATIENT_CLINIC_OR_DEPARTMENT_OTHER): Payer: Self-pay | Admitting: Internal Medicine

## 2017-12-11 MED ORDER — NICOTINE 21 MG/24HR TD PT24
1.0000 | MEDICATED_PATCH | Freq: Every day | TRANSDERMAL | 1 refills | Status: DC
Start: 2017-12-11 — End: 2018-02-12

## 2017-12-11 MED ORDER — NICOTINE 21 MG/24HR TD PT24: 1 | patch | Freq: Every day | TRANSDERMAL | 1 refills | 0 days | Status: DC

## 2017-12-11 NOTE — Progress Notes (Signed)
Patient called stating that she would increase the dosage of the nicotine patch.  Patient is currently on 64mcg patch.

## 2017-12-11 NOTE — Progress Notes (Signed)
I called patient to clarify  Will send higher dose patch

## 2017-12-15 ENCOUNTER — Ambulatory Visit (HOSPITAL_BASED_OUTPATIENT_CLINIC_OR_DEPARTMENT_OTHER): Payer: Commercial Managed Care - HMO | Admitting: Internal Medicine

## 2017-12-15 ENCOUNTER — Other Ambulatory Visit (HOSPITAL_BASED_OUTPATIENT_CLINIC_OR_DEPARTMENT_OTHER): Payer: Self-pay | Admitting: Internal Medicine

## 2017-12-15 ENCOUNTER — Ambulatory Visit: Payer: 59 | Attending: Internal Medicine | Admitting: Internal Medicine

## 2017-12-15 ENCOUNTER — Encounter (HOSPITAL_BASED_OUTPATIENT_CLINIC_OR_DEPARTMENT_OTHER): Payer: Self-pay | Admitting: Internal Medicine

## 2017-12-15 DIAGNOSIS — F1120 Opioid dependence, uncomplicated: Secondary | ICD-10-CM

## 2017-12-15 LAB — OXYCODONE SCREEN URINE
OXYCOD SCRN URINE: NEGATIVE
OXYCOD UR SPEC GRAV: 1.02 (ref 1.003–1.035)
OXYCOD URINE CREAT: 193 md/dL
OXYCOD URINE PH: 5.5 (ref 4.5–8.5)

## 2017-12-15 LAB — ETHANOL URINE: ETHANOL URINE: NEGATIVE

## 2017-12-15 LAB — CANNABINOIDS URINE: CANNABINOIDS URINE: POSITIVE — AB

## 2017-12-15 LAB — COCAINE METABOLITES URINE: COCAINE METABOLITES URINE: NEGATIVE

## 2017-12-15 LAB — AMPHETAMINES URINE: AMPHETAMINES URINE: NEGATIVE

## 2017-12-15 LAB — FENTANYL URINE: FENTANYL URINE: NEGATIVE

## 2017-12-15 LAB — OPIATES URINE: OPIATES URINE: NEGATIVE

## 2017-12-15 LAB — BUPRENORPHINE SCREEN URINE: BUPRENORPHINE SCREEN URINE: POSITIVE

## 2017-12-15 LAB — METHADONE URINE: METHADONE URINE: NEGATIVE

## 2017-12-15 LAB — BENZODIAZEPINES URINE: BENZODIAZEPINES URINE: NEGATIVE

## 2017-12-15 MED ORDER — BUPRENORPHINE HCL-NALOXONE HCL 8-2 MG SL FILM
2.0000 | ORAL_FILM | Freq: Every day | SUBLINGUAL | 0 refills | Status: DC
Start: 2017-12-15 — End: 2017-12-28

## 2017-12-15 MED ORDER — BUPRENORPHINE HCL-NALOXONE HCL 8-2 MG SL FILM: 2 | Film | Freq: Every day | SUBLINGUAL | 0 refills | 0 days | Status: DC

## 2017-12-15 NOTE — Progress Notes (Signed)
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    Present Medications:    Current Outpatient Medications on File Prior to Visit:  nicotine (NICODERM CQ) 21 MG/24HR Place 1 patch onto the skin daily Disp: 30 patch Rfl: 1   [DISCONTINUED] buprenorphine-naloxone (SUBOXONE) 8-2 MG sublingual film Place 2 Film under the tongue daily Max Daily Amount: 2 Film  for 14 days Disp: 28 Film Rfl: 0   buPROPion (WELLBUTRIN SR) 200 MG 12 hr tablet Take 1 tablet by mouth 2 (two) times daily Disp: 60 tablet Rfl: 2   polyethylene glycol (MIRALAX) powder Take 17 g by mouth daily Mix in 64 ounces of clear liquid and take as directed Disp: 500 g Rfl: 5   INCRUSE ELLIPTA 62.5 MCG/INH inhaler INHALE 1 PUFF BY MOUTH INTO LUNGS DAILY Disp: 1 Inhaler Rfl: 11   albuterol (PROVENTIL HFA,VENTOLIN HFA, PROAIR HFA) 108 (90 Base) MCG/ACT inhaler Inhale 2 puffs into the lungs every 6 (six) hours as needed for Wheezing or Shortness of breath Disp: 1 Inhaler Rfl: 2   naproxen (NAPROSYN) 500 MG tablet Take 1 tablet by mouth 2 (two) times daily with meals for 15 days Disp: 30 tablet Rfl: 0   docusate sodium (COLACE) 100 MG capsule Take 1 capsule by mouth 2 (two) times daily Disp: 60 capsule Rfl: 11   ipratropium-albuterol (DUO-NEB) 0.5-2.5 (3) MG/3ML SOLN Inhalation Solution Take 3 mLs by nebulization 4 (four) times daily. Disp: 180 mL Rfl: 0     No current facility-administered medications on file prior to visit.     Review of Systems:  Neurological exam: negative  PHQ-9 TOTAL SCORE 03/03/2017 01/05/2017 11/27/2016   Doc FlowSheet Total Score - - -   Doc FlowSheet Total Score 4 22 18            PHYSICAL EXAMINATION:  General appearance - healthy female in no distress  Eyes - pupils 3 mm  Skin - warm and dry  Neuro - nonfocal  Affect  - normal    PMP reviewed.  No unauthorized prescriptions since last refill.    Assessment:  Opioid Dependence  Comment: condition gradually improving on current dose, actively participating in group.  Plan:  Continue buprenorphine, reviewed criteria for tapering when patient is ready.   Patient is counseled regarding relapse prevention, involvement in recovery groups, potential side effects of buprenorphine, and eventual taper of medication.    We discussed the patients current medications. The patient expressed understanding and no barriers to adherence were identified.  1. The patient indicates understanding of these issues and agrees with the plan.  Brief care plan is updated and reviewed with the patient.   2. The patient is given an After Visit Summary sheet that lists all medications with directions, allergies, orders placed during this encounter, and follow-up instructions.   3. I reviewed the patient's medical information and medical history   4. I reconciled the patient's medication list and prepared and supplied needed refills.   5. I have reviewed the past medical, family, and social history sections including the medications and allergies.    [name], MD

## 2017-12-22 ENCOUNTER — Ambulatory Visit (HOSPITAL_BASED_OUTPATIENT_CLINIC_OR_DEPARTMENT_OTHER): Payer: Commercial Managed Care - HMO | Admitting: Registered Nurse

## 2017-12-28 ENCOUNTER — Other Ambulatory Visit (HOSPITAL_BASED_OUTPATIENT_CLINIC_OR_DEPARTMENT_OTHER): Payer: Self-pay | Admitting: Internal Medicine

## 2017-12-28 DIAGNOSIS — F1120 Opioid dependence, uncomplicated: Secondary | ICD-10-CM

## 2017-12-28 MED ORDER — BUPRENORPHINE HCL-NALOXONE HCL 8-2 MG SL FILM
2.0000 | ORAL_FILM | Freq: Every day | SUBLINGUAL | 0 refills | Status: DC
Start: 2017-12-29 — End: 2018-01-12

## 2017-12-28 MED ORDER — BUPRENORPHINE HCL-NALOXONE HCL 8-2 MG SL FILM: 2 | Film | Freq: Every day | SUBLINGUAL | 0 refills | 0 days | Status: DC

## 2017-12-29 ENCOUNTER — Ambulatory Visit: Payer: 59 | Attending: Internal Medicine

## 2017-12-29 ENCOUNTER — Ambulatory Visit (HOSPITAL_BASED_OUTPATIENT_CLINIC_OR_DEPARTMENT_OTHER): Payer: Commercial Managed Care - HMO | Admitting: Internal Medicine

## 2017-12-29 ENCOUNTER — Other Ambulatory Visit (HOSPITAL_BASED_OUTPATIENT_CLINIC_OR_DEPARTMENT_OTHER): Payer: Self-pay | Admitting: Internal Medicine

## 2017-12-29 ENCOUNTER — Ambulatory Visit (HOSPITAL_BASED_OUTPATIENT_CLINIC_OR_DEPARTMENT_OTHER): Payer: 59 | Admitting: Internal Medicine

## 2017-12-29 DIAGNOSIS — F1120 Opioid dependence, uncomplicated: Secondary | ICD-10-CM | POA: Insufficient documentation

## 2017-12-29 LAB — OXYCODONE SCREEN URINE
OXYCOD SCRN URINE: NEGATIVE
OXYCOD UR SPEC GRAV: 1.015 (ref 1.003–1.035)
OXYCOD URINE CREAT: 72 md/dL
OXYCOD URINE PH: 5.5 (ref 4.5–8.5)

## 2017-12-29 LAB — CANNABINOIDS URINE: CANNABINOIDS URINE: POSITIVE — AB

## 2017-12-29 LAB — BUPRENORPHINE SCREEN URINE: BUPRENORPHINE SCREEN URINE: POSITIVE

## 2017-12-29 LAB — ETHANOL URINE: ETHANOL URINE: NEGATIVE

## 2017-12-29 LAB — BENZODIAZEPINES URINE: BENZODIAZEPINES URINE: NEGATIVE

## 2017-12-29 LAB — COCAINE METABOLITES URINE: COCAINE METABOLITES URINE: NEGATIVE

## 2017-12-29 LAB — METHADONE URINE: METHADONE URINE: NEGATIVE

## 2017-12-29 LAB — FENTANYL URINE: FENTANYL URINE: NEGATIVE

## 2017-12-29 LAB — OPIATES URINE: OPIATES URINE: NEGATIVE

## 2017-12-29 LAB — AMPHETAMINES URINE: AMPHETAMINES URINE: NEGATIVE

## 2017-12-29 NOTE — Progress Notes (Signed)
Prescription faxed

## 2017-12-29 NOTE — Progress Notes (Signed)
PER Pharmacy, Aneli Zara is a 62 year old female has requested a refill of   SUBOXONE 8-2     Last prescribed -   MED WAS LAST APPROVED TODAY AS TAMPERPROOF - PHARMACY IS REQUESTING RX TODAY, [place]     Last Office Visit: 12/15/17  Last Physical Exam: 05/27/13      Other Med Adult:  Most Recent BP Reading(s)  02/24/17 : (!) 138/96        Cholesterol (mg/dl)   Date Value   01/04/2010 264 (H)     LOW DENSITY LIPOPROTEIN DIRECT (mg/dl)   Date Value   01/04/2010 101 (H)     HIGH DENSITY LIPOPROTEIN (mg/dl)   Date Value   01/04/2010 38     No results found for: TG      THYROID SCREEN TSH REFLEX FT4 (uIU/mL)   Date Value   05/27/2013 2.130         No results found for: TSH    HEMOGLOBIN A1C (%)   Date Value   05/27/2013 5.9 (H)       No results found for: POCA1C      INR (no units)   Date Value   02/16/2007 1.0 (L)   07/03/2006 < 1.0 (L)       SODIUM (mmol/L)   Date Value   07/17/2014 140       POTASSIUM (mmol/L)   Date Value   07/17/2014 4.2           CREATININE (mg/dL)   Date Value   07/17/2014 1.0       Documented patient preferred pharmacies:    [facility], [place], [place]  Phone: [phone] Fax: [phone]

## 2017-12-29 NOTE — Progress Notes (Signed)
urine collected and sent to lab.  [place], [place], 12/29/2017

## 2017-12-31 ENCOUNTER — Encounter (HOSPITAL_BASED_OUTPATIENT_CLINIC_OR_DEPARTMENT_OTHER): Payer: Self-pay

## 2018-01-05 ENCOUNTER — Ambulatory Visit (HOSPITAL_BASED_OUTPATIENT_CLINIC_OR_DEPARTMENT_OTHER): Payer: Self-pay | Admitting: Internal Medicine

## 2018-01-05 ENCOUNTER — Ambulatory Visit (HOSPITAL_BASED_OUTPATIENT_CLINIC_OR_DEPARTMENT_OTHER): Payer: Commercial Managed Care - HMO | Admitting: Internal Medicine

## 2018-01-11 ENCOUNTER — Telehealth (HOSPITAL_BASED_OUTPATIENT_CLINIC_OR_DEPARTMENT_OTHER): Payer: Self-pay | Admitting: Registered Nurse

## 2018-01-11 NOTE — Progress Notes (Signed)
Returned call to patient in regards to her change in time for tomorrows group.  I let her know that I put her in for 5:30 pm group as requested.

## 2018-01-12 ENCOUNTER — Ambulatory Visit (HOSPITAL_BASED_OUTPATIENT_CLINIC_OR_DEPARTMENT_OTHER): Payer: Self-pay | Admitting: Internal Medicine

## 2018-01-12 ENCOUNTER — Other Ambulatory Visit (HOSPITAL_BASED_OUTPATIENT_CLINIC_OR_DEPARTMENT_OTHER): Payer: Self-pay | Admitting: Internal Medicine

## 2018-01-12 ENCOUNTER — Ambulatory Visit: Payer: 59 | Attending: Internal Medicine | Admitting: Internal Medicine

## 2018-01-12 ENCOUNTER — Encounter (HOSPITAL_BASED_OUTPATIENT_CLINIC_OR_DEPARTMENT_OTHER): Payer: Self-pay | Admitting: Internal Medicine

## 2018-01-12 DIAGNOSIS — F1120 Opioid dependence, uncomplicated: Secondary | ICD-10-CM

## 2018-01-12 LAB — OXYCODONE SCREEN URINE
OXYCOD SCRN URINE: NEGATIVE
OXYCOD UR SPEC GRAV: 1.025 (ref 1.003–1.035)
OXYCOD URINE CREAT: 248 md/dL
OXYCOD URINE PH: 5 (ref 4.5–8.5)

## 2018-01-12 LAB — ETHANOL URINE: ETHANOL URINE: NEGATIVE

## 2018-01-12 LAB — BENZODIAZEPINES URINE: BENZODIAZEPINES URINE: NEGATIVE

## 2018-01-12 LAB — COCAINE METABOLITES URINE: COCAINE METABOLITES URINE: NEGATIVE

## 2018-01-12 LAB — BUPRENORPHINE SCREEN URINE: BUPRENORPHINE SCREEN URINE: POSITIVE

## 2018-01-12 LAB — OPIATES URINE: OPIATES URINE: NEGATIVE

## 2018-01-12 MED ORDER — BUPRENORPHINE HCL-NALOXONE HCL 8-2 MG SL FILM
2.0000 | ORAL_FILM | Freq: Every day | SUBLINGUAL | 0 refills | Status: DC
Start: 2018-01-12 — End: 2018-02-02

## 2018-01-12 MED ORDER — BUPRENORPHINE HCL-NALOXONE HCL 8-2 MG SL FILM: 2 | Film | Freq: Every day | SUBLINGUAL | 0 refills | 0 days | Status: DC

## 2018-01-12 MED ORDER — BUPRENORPHINE HCL-NALOXONE HCL 8-2 MG SL FILM
2.0000 | ORAL_FILM | Freq: Every day | SUBLINGUAL | 0 refills | Status: DC
Start: 2018-01-12 — End: 2018-01-12

## 2018-01-12 NOTE — Progress Notes (Signed)
Cheryl Dixon is a 62 year old female seen in follow up for opioid dependence:    Buprenorphine dose: 16 mg  Response, adequacy of dose: good  Relapses/close calls: none  Trigger: n/a  Meetings: no    Social history/events:  Sectioned her son for alcohol abuse  This is one of the hardest things she has ever done    Present Medications:    Current Outpatient Medications on File Prior to Visit:  [DISCONTINUED] buprenorphine-naloxone (SUBOXONE) 8-2 MG sublingual film Place 2 Film under the tongue daily Max Daily Amount: 2 Film  for 14 days Disp: 28 Film Rfl: 0   nicotine (NICODERM CQ) 21 MG/24HR Place 1 patch onto the skin daily Disp: 30 patch Rfl: 1   buPROPion (WELLBUTRIN SR) 200 MG 12 hr tablet Take 1 tablet by mouth 2 (two) times daily Disp: 60 tablet Rfl: 2   polyethylene glycol (MIRALAX) powder Take 17 g by mouth daily Mix in 64 ounces of clear liquid and take as directed Disp: 500 g Rfl: 5   INCRUSE ELLIPTA 62.5 MCG/INH inhaler INHALE 1 PUFF BY MOUTH INTO LUNGS DAILY Disp: 1 Inhaler Rfl: 11   albuterol (PROVENTIL HFA,VENTOLIN HFA, PROAIR HFA) 108 (90 Base) MCG/ACT inhaler Inhale 2 puffs into the lungs every 6 (six) hours as needed for Wheezing or Shortness of breath Disp: 1 Inhaler Rfl: 2   naproxen (NAPROSYN) 500 MG tablet Take 1 tablet by mouth 2 (two) times daily with meals for 15 days Disp: 30 tablet Rfl: 0   ipratropium-albuterol (DUO-NEB) 0.5-2.5 (3) MG/3ML SOLN Inhalation Solution Take 3 mLs by nebulization 4 (four) times daily. Disp: 180 mL Rfl: 0     No current facility-administered medications on file prior to visit.     Review of Systems:  Neurological exam: negative  PHQ-9 TOTAL SCORE 03/03/2017 01/05/2017 11/27/2016   Doc FlowSheet Total Score - - -   Doc FlowSheet Total Score 4 22 18            PHYSICAL EXAMINATION:  General appearance - healthy female in no distress  Eyes - pupils 3 mm  Skin - warm and dry  Neuro - nonfocal  Affect - normal    PMP reviewed.  No unauthorized prescriptions since last  refill.    Assessment:  Opioid Dependence  Comment: condition gradually improving on current dose, actively participating in group.  Plan:  Continue buprenorphine, reviewed criteria for tapering when patient is ready.   Will be switching to 11:30 group  Next group will be 10/8.  Patient is counseled regarding relapse prevention, involvement in recovery groups, potential side effects of buprenorphine, and eventual taper of medication.    We discussed the patients current medications. The patient expressed understanding and no barriers to adherence were identified.  1. The patient indicates understanding of these issues and agrees with the plan.  Brief care plan is updated and reviewed with the patient.   2. The patient is given an After Visit Summary sheet that lists all medications with directions, allergies, orders placed during this encounter, and follow-up instructions.   3. I reviewed the patient's medical information and medical history   4. I reconciled the patient's medication list and prepared and supplied needed refills.   5. I have reviewed the past medical, family, and social history sections including the medications and allergies.    [name], MD

## 2018-01-14 ENCOUNTER — Ambulatory Visit (INDEPENDENT_AMBULATORY_CARE_PROVIDER_SITE_OTHER): Payer: 59 | Admitting: Orthopaedic Surgery

## 2018-01-14 ENCOUNTER — Encounter (INDEPENDENT_AMBULATORY_CARE_PROVIDER_SITE_OTHER): Payer: Self-pay | Admitting: Orthopaedic Surgery

## 2018-01-14 DIAGNOSIS — M2021 Hallux rigidus, right foot: Secondary | ICD-10-CM

## 2018-01-14 NOTE — Progress Notes (Signed)
Office Visit Note   Patient: Michelle Roberts           Date of Birth: [birth year]           MRN: [id number] Visit Date: 01/14/2018              Requested by: Antony Contras, MD [address] [place], [place] [address] PCP: Antony Contras, MD   Assessment & Plan: Visit Diagnoses:  1. Hallux rigidus, right foot     Plan: Impression is 62 year old female with symptomatic right hallux rigidus.  I reviewed the x-rays which does show a dorsal osteophyte.  Patient has failed conservative treatment and is now still limited by this pain.  She wishes to proceed with surgical treatment.  We discussed cheilectomy with evaluation of the joint and possible arthrodesis if there is significant degenerative joint disease which I think is a good possibility.  Rehab and recovery discussed with the patient.  We will schedule this in the near future.  Follow-Up Instructions: Return for 2 week postop visit.   Orders:  No orders of the defined types were placed in this encounter.  No orders of the defined types were placed in this encounter.     Procedures: No procedures performed   Clinical Data: No additional findings.   Subjective: Chief Complaint  Patient presents with  . Right Foot - Pain  . GRT Toe    Michelle Roberts is a 62 year old female comes in with chronic right great toe pain.  She has had previous cortisone injections with temporary relief.  She currently wearing a stiff soled shoes which help but do not completely relieve the pain.  She does have rheumatoid arthritis but she does not take Biologics.  She does have well-controlled diabetes.  She states that she lives with chronic pain in her right great toe that is worse with ambulation.  Numbness and tingling.  Denies history of gout.   Review of Systems  Constitutional: Negative.   HENT: Negative.   Eyes: Negative.   Respiratory: Negative.   Cardiovascular: Negative.   Endocrine: Negative.   Musculoskeletal: Negative.     Neurological: Negative.   Hematological: Negative.   Psychiatric/Behavioral: Negative.   All other systems reviewed and are negative.    Objective: Vital Signs: There were no vitals taken for this visit.  Physical Exam  Constitutional: She is oriented to person, place, and time. She appears well-developed and well-nourished.  HENT:  Head: Normocephalic and atraumatic.  Eyes: EOM are normal.  Neck: Neck supple.  Pulmonary/Chest: Effort normal.  Abdominal: Soft.  Neurological: She is alert and oriented to person, place, and time.  Skin: Skin is warm. Capillary refill takes less than 2 seconds.  Psychiatric: She has a normal mood and affect. Her behavior is normal. Judgment and thought content normal.  Nursing note and vitals reviewed.   Ortho Exam Right great toe exam shows a palpable dorsal osteophyte.  There is no overlying skin compromise.  She has most of her pain with dorsiflexion of the toe.  She does have mild pain in the mid range.  Mildly positive grind test. Specialty Comments:  No specialty comments available.  Imaging: No results found.   PMFS History: Patient Active Problem List   Diagnosis Date Noted  . 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 Fibromyalgia 08/12/2016  . High risk medication use 08/12/2016  . Primary osteoarthritis of both hands 08/12/2016  . Acute midline low back pain 08/12/2016  . DDD (  degenerative disc disease), lumbar 08/12/2016  . History of hypertension 08/12/2016  . History of high cholesterol 08/12/2016  . Thyroid ca ([place]) 08/12/2016  . History of hypothyroidism 08/12/2016  . History of gastroesophageal reflux (GERD) 08/12/2016  . History of TMJ syndrome 08/12/2016  . Burning tongue syndrome 08/12/2016  . History of vitamin D deficiency 08/12/2016  . Other sleep apnea 08/12/2016  . Vitamin D deficiency 08/12/2016  . Rheumatoid arthritis with rheumatoid factor of multiple sites without organ or systems involvement ([place])  12/31/2015  . Goals of care, counseling/discussion 06/02/2013  . Myofascial muscle pain 06/02/2013  . Narcotic-induced mood disorder ([place]) 06/02/2013  . Inflammatory arthritis 04/08/2011  . Plantar fasciitis 04/08/2011  . Yeast infection 04/08/2011   Past Medical History:  Diagnosis Date  . Anxiety   . Arthritis   . Depression   . Fibromyalgia   . GERD (gastroesophageal reflux disease)   . Hyperlipidemia   . Hypertension   . Hypothyroidism    thyroidectomy  . Neoplasm of parotid gland   . Neuromuscular disorder ([place])   . Rheumatoid aortitis   . Sleep apnea    uses CPAP sometimes2-3 nights/week    Family History  Problem Relation Age of Onset  . Alzheimer's disease Mother   . Alzheimer's disease Sister     Past Surgical History:  Procedure Laterality Date  . ABDOMINAL HYSTERECTOMY    . CHOLECYSTECTOMY    . HAMMER TOE SURGERY    . MASS EXCISION N/A 11/17/2017   Procedure: EXCISION SOFT PALATE LESION;  Surgeon: Rozetta Nunnery, MD;  Location: [facility];  Service: ENT;  Laterality: N/A;  . PAROTIDECTOMY Left 04/08/2016   Procedure: LEFT SUPERFICIAL PAROTIDECTOMY WITH FACIAL NERVE DISECTION;  Surgeon: Rozetta Nunnery, MD;  Location: [facility];  Service: ENT;  Laterality: Left;  . THYROIDECTOMY     Social History   Occupational History  . Not on file  Tobacco Use  . Smoking status: Never Smoker  . Smokeless tobacco: Never Used  Substance and Sexual Activity  . Alcohol use: No  . Drug use: No  . Sexual activity: Not on file

## 2018-01-15 ENCOUNTER — Telehealth (HOSPITAL_BASED_OUTPATIENT_CLINIC_OR_DEPARTMENT_OTHER): Payer: Self-pay | Admitting: Registered Nurse

## 2018-01-15 NOTE — Progress Notes (Signed)
Spoke with patient this morning who is requesting to change her group due to an interaction with another group member.  I changed her time to October 8th at 11:30 am.  She has my contact information should anything need to change before then.

## 2018-01-19 ENCOUNTER — Ambulatory Visit (HOSPITAL_BASED_OUTPATIENT_CLINIC_OR_DEPARTMENT_OTHER): Payer: Commercial Managed Care - HMO | Admitting: Internal Medicine

## 2018-01-21 ENCOUNTER — Encounter (HOSPITAL_BASED_OUTPATIENT_CLINIC_OR_DEPARTMENT_OTHER): Payer: Self-pay

## 2018-01-21 ENCOUNTER — Emergency Department
Admission: EM | Admit: 2018-01-21 | Discharge: 2018-01-21 | Disposition: A | Discharge status: Home / Self Care | Payer: Auto Insurance (includes no fault) | Source: Intra-hospital | Attending: Emergency Medicine | Admitting: Emergency Medicine

## 2018-01-21 DIAGNOSIS — M545 Low back pain: Secondary | ICD-10-CM | POA: Diagnosis present

## 2018-01-21 DIAGNOSIS — F17210 Nicotine dependence, cigarettes, uncomplicated: Secondary | ICD-10-CM | POA: Diagnosis not present

## 2018-01-21 DIAGNOSIS — Y92410 Unspecified street and highway as the place of occurrence of the external cause: Secondary | ICD-10-CM | POA: Diagnosis not present

## 2018-01-21 MED ORDER — NAPROXEN 250 MG PO TABS
500.00 mg | ORAL_TABLET | Freq: Once | ORAL | Status: AC
Start: 2018-01-21 — End: 2018-01-21
  Administered 2018-01-21: 500 mg via ORAL
  Filled 2018-01-21: qty 2

## 2018-01-21 MED ORDER — NAPROXEN 500 MG PO TABS
500.0000 mg | ORAL_TABLET | Freq: Two times a day (BID) | ORAL | 0 refills | Status: DC | PRN
Start: 2018-01-21 — End: 2018-05-05

## 2018-01-21 MED ORDER — LIDOCAINE 5 % EX PTCH
1.00 | MEDICATED_PATCH | CUTANEOUS | 0 refills | Status: AC
Start: 2018-01-21 — End: 2018-01-31

## 2018-01-21 MED ORDER — NAPROXEN 500 MG PO TABS: 500 mg | tablet | Freq: Two times a day (BID) | ORAL | 0 refills | 0 days | Status: AC | PRN

## 2018-01-21 MED ORDER — LIDOCAINE 4 % EX PTCH
1.0000 | MEDICATED_PATCH | Freq: Once | CUTANEOUS | Status: DC
Start: 2018-01-21 — End: 2018-01-21
  Administered 2018-01-21: 1 via TRANSDERMAL
  Filled 2018-01-21: qty 1

## 2018-01-21 MED ORDER — LIDOCAINE 5 % EX PTCH: 1 | patch | CUTANEOUS | 0 refills | 0 days | Status: AC

## 2018-01-21 MED ORDER — CYCLOBENZAPRINE HCL 10 MG PO TABS: 10 mg | tablet | Freq: Two times a day (BID) | ORAL | 0 refills | 0 days | Status: AC | PRN

## 2018-01-21 MED ORDER — CYCLOBENZAPRINE HCL 10 MG PO TABS
10.00 mg | ORAL_TABLET | Freq: Two times a day (BID) | ORAL | 0 refills | Status: AC | PRN
Start: 2018-01-21 — End: 2018-01-31

## 2018-01-21 NOTE — Discharge Instructions (Signed)
You should take the naproxen twice daily with food for the next several days.  You can use the Lidoderm patches at home as well.  You can add Tylenol to this medication.  You can use the Flexeril at night to help with muscle spasm.  You should follow-up with your primary care doctor.  You should return to the emergency department if you do experience difficulty urinating incontinence of urine or stool or weakness or any new concerns
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
## 2018-01-21 NOTE — Narrator Note (Signed)
Patient Disposition    Patient education for diagnosis, medications, activity, diet and follow-up.  Patient left ED 10:21 AM.  Patient rep received written instructions.  Interpreter to provide instructions: No    Patient belongings with patient: YES    Have all existing LDAs been addressed? N/A    Have all IV infusions been stopped? N/A    Discharged to: Discharged to home
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
## 2018-01-21 NOTE — Narrator Note (Signed)
Pt medicated for pain.

## 2018-01-26 ENCOUNTER — Ambulatory Visit (HOSPITAL_BASED_OUTPATIENT_CLINIC_OR_DEPARTMENT_OTHER): Payer: Self-pay | Admitting: Internal Medicine

## 2018-01-27 ENCOUNTER — Telehealth (INDEPENDENT_AMBULATORY_CARE_PROVIDER_SITE_OTHER): Payer: Self-pay | Admitting: Orthopaedic Surgery

## 2018-01-27 ENCOUNTER — Other Ambulatory Visit: Payer: Self-pay

## 2018-01-27 ENCOUNTER — Encounter (HOSPITAL_BASED_OUTPATIENT_CLINIC_OR_DEPARTMENT_OTHER): Payer: Self-pay | Admitting: *Deleted

## 2018-01-27 NOTE — Telephone Encounter (Signed)
tyes

## 2018-01-27 NOTE — Telephone Encounter (Signed)
SEE MESSAGE

## 2018-01-27 NOTE — Telephone Encounter (Signed)
Patient called will need a prescription for knee scooter

## 2018-01-28 DIAGNOSIS — Z23 Encounter for immunization: Secondary | ICD-10-CM | POA: Diagnosis not present

## 2018-01-28 DIAGNOSIS — H8110 Benign paroxysmal vertigo, unspecified ear: Secondary | ICD-10-CM | POA: Diagnosis not present

## 2018-01-28 DIAGNOSIS — E782 Mixed hyperlipidemia: Secondary | ICD-10-CM | POA: Diagnosis not present

## 2018-01-28 DIAGNOSIS — E790 Hyperuricemia without signs of inflammatory arthritis and tophaceous disease: Secondary | ICD-10-CM | POA: Diagnosis not present

## 2018-01-28 DIAGNOSIS — G4700 Insomnia, unspecified: Secondary | ICD-10-CM | POA: Diagnosis not present

## 2018-01-28 NOTE — Telephone Encounter (Signed)
Ready for pick up patient aware. ° °

## 2018-02-01 ENCOUNTER — Encounter (HOSPITAL_BASED_OUTPATIENT_CLINIC_OR_DEPARTMENT_OTHER)
Admission: RE | Admit: 2018-02-01 | Discharge: 2018-02-01 | Disposition: A | Discharge status: Home / Self Care | Payer: 59 | Source: Ambulatory Visit | Attending: Orthopaedic Surgery | Admitting: Orthopaedic Surgery

## 2018-02-01 DIAGNOSIS — Z791 Long term (current) use of non-steroidal anti-inflammatories (NSAID): Secondary | ICD-10-CM | POA: Diagnosis not present

## 2018-02-01 DIAGNOSIS — E785 Hyperlipidemia, unspecified: Secondary | ICD-10-CM | POA: Diagnosis not present

## 2018-02-01 DIAGNOSIS — I10 Essential (primary) hypertension: Secondary | ICD-10-CM | POA: Insufficient documentation

## 2018-02-01 DIAGNOSIS — Z7984 Long term (current) use of oral hypoglycemic drugs: Secondary | ICD-10-CM | POA: Diagnosis not present

## 2018-02-01 DIAGNOSIS — E039 Hypothyroidism, unspecified: Secondary | ICD-10-CM

## 2018-02-01 DIAGNOSIS — K219 Gastro-esophageal reflux disease without esophagitis: Secondary | ICD-10-CM

## 2018-02-01 DIAGNOSIS — Z79899 Other long term (current) drug therapy: Secondary | ICD-10-CM | POA: Diagnosis not present

## 2018-02-01 DIAGNOSIS — F419 Anxiety disorder, unspecified: Secondary | ICD-10-CM | POA: Diagnosis not present

## 2018-02-01 DIAGNOSIS — Z90710 Acquired absence of both cervix and uterus: Secondary | ICD-10-CM | POA: Insufficient documentation

## 2018-02-01 DIAGNOSIS — M79674 Pain in right toe(s): Secondary | ICD-10-CM | POA: Insufficient documentation

## 2018-02-01 DIAGNOSIS — Z9049 Acquired absence of other specified parts of digestive tract: Secondary | ICD-10-CM | POA: Insufficient documentation

## 2018-02-01 DIAGNOSIS — F329 Major depressive disorder, single episode, unspecified: Secondary | ICD-10-CM | POA: Diagnosis not present

## 2018-02-01 DIAGNOSIS — M2021 Hallux rigidus, right foot: Secondary | ICD-10-CM | POA: Insufficient documentation

## 2018-02-01 DIAGNOSIS — Z79890 Hormone replacement therapy (postmenopausal): Secondary | ICD-10-CM | POA: Diagnosis not present

## 2018-02-01 DIAGNOSIS — Z01812 Encounter for preprocedural laboratory examination: Secondary | ICD-10-CM | POA: Insufficient documentation

## 2018-02-01 DIAGNOSIS — G4730 Sleep apnea, unspecified: Secondary | ICD-10-CM | POA: Diagnosis not present

## 2018-02-01 DIAGNOSIS — M797 Fibromyalgia: Secondary | ICD-10-CM

## 2018-02-01 DIAGNOSIS — E890 Postprocedural hypothyroidism: Secondary | ICD-10-CM | POA: Diagnosis not present

## 2018-02-01 DIAGNOSIS — Z6841 Body Mass Index (BMI) 40.0 and over, adult: Secondary | ICD-10-CM | POA: Diagnosis not present

## 2018-02-01 DIAGNOSIS — M069 Rheumatoid arthritis, unspecified: Secondary | ICD-10-CM | POA: Diagnosis not present

## 2018-02-01 LAB — BASIC METABOLIC PANEL
Anion gap: 10 (ref 5–15)
BUN: 11 mg/dL (ref 6–20)
CO2: 24 mmol/L (ref 22–32)
Calcium: 8.8 mg/dL — ABNORMAL LOW (ref 8.9–10.3)
Chloride: 103 mmol/L (ref 98–111)
Creatinine, Ser: 0.76 mg/dL (ref 0.44–1.00)
GFR calc Af Amer: >60 mL/min (ref >60)
GFR calc non Af Amer: >60 mL/min (ref >60)
Glucose, Bld: 69 mg/dL — ABNORMAL LOW (ref 70–99)
Potassium: 5.4 mmol/L — ABNORMAL HIGH (ref 3.5–5.1)
Sodium: 137 mmol/L (ref 135–145)

## 2018-02-01 NOTE — Progress Notes (Addendum)
Pt states is "difficult IV stick".  IV consult placed in Epic and call made for IV team to be here at 7:15 day of surgery, per Dr. Kerin Salen orders.   Lab results reviewed with Dr. Fransisco Beau, will obtain ISTAT day of surgery, order placed in Epic.

## 2018-02-02 ENCOUNTER — Ambulatory Visit (HOSPITAL_BASED_OUTPATIENT_CLINIC_OR_DEPARTMENT_OTHER): Payer: Self-pay | Admitting: Family Medicine

## 2018-02-02 ENCOUNTER — Other Ambulatory Visit (HOSPITAL_BASED_OUTPATIENT_CLINIC_OR_DEPARTMENT_OTHER): Payer: Self-pay | Admitting: Internal Medicine

## 2018-02-02 ENCOUNTER — Ambulatory Visit (HOSPITAL_BASED_OUTPATIENT_CLINIC_OR_DEPARTMENT_OTHER): Payer: Commercial Managed Care - HMO | Admitting: Internal Medicine

## 2018-02-02 DIAGNOSIS — F1120 Opioid dependence, uncomplicated: Secondary | ICD-10-CM

## 2018-02-02 MED ORDER — BUPRENORPHINE HCL-NALOXONE HCL 8-2 MG SL FILM: 2 | Film | Freq: Every day | SUBLINGUAL | 0 refills | 0 days | Status: DC

## 2018-02-02 MED ORDER — BUPRENORPHINE HCL-NALOXONE HCL 8-2 MG SL FILM
2.0000 | ORAL_FILM | Freq: Every day | SUBLINGUAL | 0 refills | Status: DC
Start: 2018-02-02 — End: 2018-02-09

## 2018-02-02 NOTE — Progress Notes (Signed)
Refilled to last until next appt on 10/15  Will leave rx at front desk for pt to pick up.

## 2018-02-02 NOTE — Anesthesia Preprocedure Evaluation (Addendum)
Anesthesia Evaluation  Patient identified by MRN, date of birth, ID band Patient awake    Reviewed: Allergy & Precautions, NPO status , Patient's Chart, lab work & pertinent test results  Airway Mallampati: III  TM Distance: >3 FB Neck ROM: Full    Dental no notable dental hx.    Pulmonary sleep apnea and Continuous Positive Airway Pressure Ventilation ,    Pulmonary exam normal breath sounds clear to auscultation       Cardiovascular hypertension, Pt. on medications Normal cardiovascular exam Rhythm:Regular Rate:Normal  ECG: NSR, rate 89   Neuro/Psych PSYCHIATRIC DISORDERS Anxiety Depression  Neuromuscular disease    GI/Hepatic GERD  Medicated and Controlled,(+)     substance abuse  ,   Endo/Other  Hypothyroidism Morbid obesity  Renal/GU negative Renal ROS     Musculoskeletal  (+) Arthritis , Osteoarthritis and Rheumatoid disorders,  Fibromyalgia -, narcotic dependentGout   Abdominal (+) + obese,   Peds  Hematology HLD   Anesthesia Other Findings right great toe hallux rigidus  Reproductive/Obstetrics                          Anesthesia Physical Anesthesia Plan  ASA: II  Anesthesia Plan: General and Regional   Post-op Pain Management: GA combined w/ Regional for post-op pain   Induction: Intravenous  PONV Risk Score and Plan: 3 and Ondansetron, Dexamethasone, Midazolam and Treatment may vary due to age or medical condition  Airway Management Planned: LMA  Additional Equipment:   Intra-op Plan:   Post-operative Plan: Extubation in OR  Informed Consent: I have reviewed the patients History and Physical, chart, labs and discussed the procedure including the risks, benefits and alternatives for the proposed anesthesia with the patient or authorized representative who has indicated his/her understanding and acceptance.   Dental advisory given  Plan Discussed with:  CRNA  Anesthesia Plan Comments:        Anesthesia Quick Evaluation

## 2018-02-03 ENCOUNTER — Ambulatory Visit: Payer: 59 | Attending: Internal Medicine

## 2018-02-03 ENCOUNTER — Ambulatory Visit (HOSPITAL_BASED_OUTPATIENT_CLINIC_OR_DEPARTMENT_OTHER)
Admission: RE | Admit: 2018-02-03 | Discharge: 2018-02-03 | Disposition: A | Discharge status: Home / Self Care | Payer: 59 | Source: Ambulatory Visit | Attending: Orthopaedic Surgery | Admitting: Orthopaedic Surgery

## 2018-02-03 ENCOUNTER — Ambulatory Visit (HOSPITAL_BASED_OUTPATIENT_CLINIC_OR_DEPARTMENT_OTHER): Payer: 59 | Admitting: Anesthesiology

## 2018-02-03 ENCOUNTER — Encounter (HOSPITAL_BASED_OUTPATIENT_CLINIC_OR_DEPARTMENT_OTHER): Payer: Self-pay | Admitting: Certified Registered"

## 2018-02-03 ENCOUNTER — Encounter (HOSPITAL_BASED_OUTPATIENT_CLINIC_OR_DEPARTMENT_OTHER)
Admission: RE | Disposition: A | Discharge status: Home / Self Care | Payer: Self-pay | Source: Ambulatory Visit | Attending: Orthopaedic Surgery

## 2018-02-03 ENCOUNTER — Other Ambulatory Visit: Payer: Self-pay

## 2018-02-03 DIAGNOSIS — F1120 Opioid dependence, uncomplicated: Secondary | ICD-10-CM | POA: Insufficient documentation

## 2018-02-03 DIAGNOSIS — M19041 Primary osteoarthritis, right hand: Secondary | ICD-10-CM

## 2018-02-03 DIAGNOSIS — Z79890 Hormone replacement therapy (postmenopausal): Secondary | ICD-10-CM | POA: Insufficient documentation

## 2018-02-03 DIAGNOSIS — G4730 Sleep apnea, unspecified: Secondary | ICD-10-CM | POA: Insufficient documentation

## 2018-02-03 DIAGNOSIS — F329 Major depressive disorder, single episode, unspecified: Secondary | ICD-10-CM | POA: Insufficient documentation

## 2018-02-03 DIAGNOSIS — E785 Hyperlipidemia, unspecified: Secondary | ICD-10-CM | POA: Insufficient documentation

## 2018-02-03 DIAGNOSIS — E890 Postprocedural hypothyroidism: Secondary | ICD-10-CM | POA: Diagnosis not present

## 2018-02-03 DIAGNOSIS — M19042 Primary osteoarthritis, left hand: Secondary | ICD-10-CM

## 2018-02-03 DIAGNOSIS — F419 Anxiety disorder, unspecified: Secondary | ICD-10-CM | POA: Insufficient documentation

## 2018-02-03 DIAGNOSIS — Z7984 Long term (current) use of oral hypoglycemic drugs: Secondary | ICD-10-CM | POA: Insufficient documentation

## 2018-02-03 DIAGNOSIS — M797 Fibromyalgia: Secondary | ICD-10-CM | POA: Insufficient documentation

## 2018-02-03 DIAGNOSIS — M2021 Hallux rigidus, right foot: Secondary | ICD-10-CM

## 2018-02-03 DIAGNOSIS — K219 Gastro-esophageal reflux disease without esophagitis: Secondary | ICD-10-CM | POA: Insufficient documentation

## 2018-02-03 DIAGNOSIS — Z6841 Body Mass Index (BMI) 40.0 and over, adult: Secondary | ICD-10-CM | POA: Insufficient documentation

## 2018-02-03 DIAGNOSIS — Z791 Long term (current) use of non-steroidal anti-inflammatories (NSAID): Secondary | ICD-10-CM | POA: Insufficient documentation

## 2018-02-03 DIAGNOSIS — M069 Rheumatoid arthritis, unspecified: Secondary | ICD-10-CM | POA: Insufficient documentation

## 2018-02-03 DIAGNOSIS — I10 Essential (primary) hypertension: Secondary | ICD-10-CM | POA: Diagnosis not present

## 2018-02-03 DIAGNOSIS — Z79899 Other long term (current) drug therapy: Secondary | ICD-10-CM | POA: Insufficient documentation

## 2018-02-03 DIAGNOSIS — G8918 Other acute postprocedural pain: Secondary | ICD-10-CM | POA: Diagnosis not present

## 2018-02-03 HISTORY — PX: TARSAL METATARSAL ARTHRODESIS: SHX2481

## 2018-02-03 LAB — POCT I-STAT, CHEM 8
BUN: 14 mg/dL (ref 6–20)
Calcium, Ion: 1.11 mmol/L — ABNORMAL LOW (ref 1.15–1.40)
Chloride: 101 mmol/L (ref 98–111)
Creatinine, Ser: 0.8 mg/dL (ref 0.44–1.00)
Glucose, Bld: 123 mg/dL — ABNORMAL HIGH (ref 70–99)
HCT: 46 % (ref 36.0–46.0)
Hemoglobin: 15.6 g/dL — ABNORMAL HIGH (ref 12.0–15.0)
Potassium: 3.6 mmol/L (ref 3.5–5.1)
Sodium: 140 mmol/L (ref 135–145)
TCO2: 30 mmol/L (ref 22–32)

## 2018-02-03 LAB — AMPHETAMINES URINE: AMPHETAMINES URINE: NEGATIVE

## 2018-02-03 LAB — OPIATES URINE: OPIATES URINE: NEGATIVE

## 2018-02-03 LAB — BUPRENORPHINE SCREEN URINE: BUPRENORPHINE SCREEN URINE: POSITIVE

## 2018-02-03 LAB — METHADONE URINE: METHADONE URINE: NEGATIVE

## 2018-02-03 LAB — OXYCODONE SCREEN URINE
OXYCOD SCRN URINE: NEGATIVE
OXYCOD UR SPEC GRAV: 1.01 (ref 1.003–1.035)
OXYCOD URINE CREAT: 76 md/dL
OXYCOD URINE PH: 6.5 (ref 4.5–8.5)

## 2018-02-03 LAB — ETHANOL URINE: ETHANOL URINE: NEGATIVE

## 2018-02-03 LAB — BENZODIAZEPINES URINE: BENZODIAZEPINES URINE: NEGATIVE

## 2018-02-03 LAB — CANNABINOIDS URINE: CANNABINOIDS URINE: POSITIVE — AB

## 2018-02-03 LAB — COCAINE METABOLITES URINE: COCAINE METABOLITES URINE: NEGATIVE

## 2018-02-03 LAB — FENTANYL URINE: FENTANYL URINE: NEGATIVE

## 2018-02-03 SURGERY — FUSION, TARSOMETATARSAL JOINT
Anesthesia: Regional | Site: Foot | Laterality: Right

## 2018-02-03 MED ORDER — ACETAMINOPHEN 10 MG/ML IV SOLN
INTRAVENOUS | Status: AC
  Filled 2018-02-03: qty 100

## 2018-02-03 MED ORDER — PROPOFOL 10 MG/ML IV BOLUS
INTRAVENOUS | Status: DC | PRN
  Administered 2018-02-03: 200 mg via INTRAVENOUS

## 2018-02-03 MED ORDER — FENTANYL CITRATE (PF) 100 MCG/2ML IJ SOLN
INTRAMUSCULAR | Status: AC
  Filled 2018-02-03: qty 2

## 2018-02-03 MED ORDER — OXYCODONE-ACETAMINOPHEN 5-325 MG PO TABS: 1.0000 | ORAL_TABLET | ORAL | 0 refills | Status: DC | PRN

## 2018-02-03 MED ORDER — ONDANSETRON HCL 4 MG/2ML IJ SOLN
INTRAMUSCULAR | Status: DC | PRN
  Administered 2018-02-03: 4 mg via INTRAVENOUS

## 2018-02-03 MED ORDER — BUPIVACAINE HCL (PF) 0.25 % IJ SOLN
INTRAMUSCULAR | Status: AC
  Filled 2018-02-03: qty 30

## 2018-02-03 MED ORDER — FENTANYL CITRATE (PF) 100 MCG/2ML IJ SOLN
25.0000 ug | INTRAMUSCULAR | Status: DC | PRN
  Administered 2018-02-03 (×2): 50 ug via INTRAVENOUS

## 2018-02-03 MED ORDER — CHLORHEXIDINE GLUCONATE 4 % EX LIQD: 60.0000 mL | Freq: Once | CUTANEOUS | Status: DC

## 2018-02-03 MED ORDER — MIDAZOLAM HCL 2 MG/2ML IJ SOLN
INTRAMUSCULAR | Status: AC
  Filled 2018-02-03: qty 2

## 2018-02-03 MED ORDER — PROPOFOL 500 MG/50ML IV EMUL
INTRAVENOUS | Status: DC | PRN
  Administered 2018-02-03: 35 ug/kg/min via INTRAVENOUS

## 2018-02-03 MED ORDER — LACTATED RINGERS IV SOLN
INTRAVENOUS | Status: DC
  Administered 2018-02-03 (×2): via INTRAVENOUS

## 2018-02-03 MED ORDER — FENTANYL CITRATE (PF) 100 MCG/2ML IJ SOLN
50.0000 ug | INTRAMUSCULAR | Status: AC | PRN
  Administered 2018-02-03 (×3): 25 ug via INTRAVENOUS

## 2018-02-03 MED ORDER — LACTATED RINGERS IV SOLN: INTRAVENOUS | Status: DC

## 2018-02-03 MED ORDER — ROPIVACAINE HCL 5 MG/ML IJ SOLN
INTRAMUSCULAR | Status: DC | PRN
  Administered 2018-02-03: 30 mL via PERINEURAL

## 2018-02-03 MED ORDER — PROMETHAZINE HCL 25 MG PO TABS: 25.0000 mg | ORAL_TABLET | Freq: Four times a day (QID) | ORAL | 1 refills | Status: DC | PRN

## 2018-02-03 MED ORDER — ONDANSETRON HCL 4 MG/2ML IJ SOLN: 4.0000 mg | Freq: Once | INTRAMUSCULAR | Status: DC | PRN

## 2018-02-03 MED ORDER — ACETAMINOPHEN 10 MG/ML IV SOLN
1000.0000 mg | Freq: Once | INTRAVENOUS | Status: AC
  Administered 2018-02-03: 1000 mg via INTRAVENOUS

## 2018-02-03 MED ORDER — LIDOCAINE HCL (CARDIAC) PF 100 MG/5ML IV SOSY
PREFILLED_SYRINGE | INTRAVENOUS | Status: DC | PRN
  Administered 2018-02-03: 30 mg via INTRAVENOUS

## 2018-02-03 MED ORDER — KETOROLAC TROMETHAMINE 30 MG/ML IJ SOLN
30.0000 mg | Freq: Once | INTRAMUSCULAR | Status: AC
  Administered 2018-02-03: 30 mg via INTRAVENOUS

## 2018-02-03 MED ORDER — MIDAZOLAM HCL 2 MG/2ML IJ SOLN
1.0000 mg | INTRAMUSCULAR | Status: DC | PRN
  Administered 2018-02-03: 2 mg via INTRAVENOUS

## 2018-02-03 MED ORDER — BUPIVACAINE HCL (PF) 0.25 % IJ SOLN
INTRAMUSCULAR | Status: DC | PRN
  Administered 2018-02-03: 10 mL

## 2018-02-03 MED ORDER — DEXAMETHASONE SODIUM PHOSPHATE 10 MG/ML IJ SOLN
INTRAMUSCULAR | Status: DC | PRN
  Administered 2018-02-03: 10 mg via INTRAVENOUS

## 2018-02-03 MED ORDER — SCOPOLAMINE 1 MG/3DAYS TD PT72: 1.0000 | MEDICATED_PATCH | Freq: Once | TRANSDERMAL | Status: DC | PRN

## 2018-02-03 MED ORDER — CEFAZOLIN SODIUM-DEXTROSE 2-4 GM/100ML-% IV SOLN
INTRAVENOUS | Status: AC
  Filled 2018-02-03: qty 100

## 2018-02-03 MED ORDER — KETOROLAC TROMETHAMINE 30 MG/ML IJ SOLN
INTRAMUSCULAR | Status: AC
  Filled 2018-02-03: qty 1

## 2018-02-03 MED ORDER — BUPIVACAINE HCL (PF) 0.5 % IJ SOLN
INTRAMUSCULAR | Status: AC
  Filled 2018-02-03: qty 30

## 2018-02-03 MED ORDER — CEFAZOLIN SODIUM-DEXTROSE 2-4 GM/100ML-% IV SOLN
2.0000 g | INTRAVENOUS | Status: AC
  Administered 2018-02-03: 2 g via INTRAVENOUS

## 2018-02-03 SURGICAL SUPPLY — 93 items
BANDAGE ACE 4X5 VEL STRL LF (GAUZE/BANDAGES/DRESSINGS) ×2 IMPLANT
BANDAGE ESMARK 6X9 LF (GAUZE/BANDAGES/DRESSINGS) ×1 IMPLANT
BIT DRILL 2.0MM (BIT) IMPLANT
BIT DRILL CANN AO ASNIS 2.1 (DRILL) IMPLANT
BLADE CLIPPER SURG (BLADE) ×2 IMPLANT
BLADE HEX COATED 2.75 (ELECTRODE) ×1 IMPLANT
BLADE SURG 15 STRL LF DISP TIS (BLADE) ×2 IMPLANT
BLADE SURG 15 STRL SS (BLADE) ×9
BNDG CMPR 9X6 STRL LF SNTH (GAUZE/BANDAGES/DRESSINGS) ×1
BNDG COHESIVE 4X5 TAN STRL (GAUZE/BANDAGES/DRESSINGS) ×3 IMPLANT
BNDG ESMARK 6X9 LF (GAUZE/BANDAGES/DRESSINGS) ×3
CANISTER SUCT 1200ML W/VALVE (MISCELLANEOUS) ×3 IMPLANT
COUNTERSINK CANN 3 STRL (BIT) ×3
COVER BACK TABLE 60X90IN (DRAPES) ×3 IMPLANT
COVER MAYO STAND STRL (DRAPES) ×2 IMPLANT
COVER WAND RF STERILE (DRAPES) IMPLANT
CUFF TOURNIQUET SINGLE 34IN LL (TOURNIQUET CUFF) ×2 IMPLANT
DECANTER SPIKE VIAL GLASS SM (MISCELLANEOUS) ×2 IMPLANT
DRAPE C-ARM 42X72 X-RAY (DRAPES) ×1 IMPLANT
DRAPE C-ARMOR (DRAPES) ×1 IMPLANT
DRAPE EXTREMITY T 121X128X90 (DRAPE) ×3 IMPLANT
DRAPE IMP U-DRAPE 54X76 (DRAPES) ×3 IMPLANT
DRAPE OEC MINIVIEW 54X84 (DRAPES) ×3 IMPLANT
DRAPE SURG 17X23 STRL (DRAPES) ×2 IMPLANT
DRAPE U-SHAPE 47X51 STRL (DRAPES) ×3 IMPLANT
DRILL BIT 2.0MM (BIT) ×3
DRILL CANN AO ASNIS 2.1 (DRILL) ×3
DRSG PAD ABDOMINAL 8X10 ST (GAUZE/BANDAGES/DRESSINGS) ×4 IMPLANT
DURAPREP 26ML APPLICATOR (WOUND CARE) ×5 IMPLANT
ELECT REM PT RETURN 9FT ADLT (ELECTROSURGICAL) ×3
ELECTRODE REM PT RTRN 9FT ADLT (ELECTROSURGICAL) ×1 IMPLANT
GAUZE SPONGE 4X4 12PLY STRL (GAUZE/BANDAGES/DRESSINGS) ×3 IMPLANT
GAUZE XEROFORM 1X8 LF (GAUZE/BANDAGES/DRESSINGS) ×3 IMPLANT
GLOVE BIOGEL PI IND STRL 7.0 (GLOVE) ×1 IMPLANT
GLOVE BIOGEL PI INDICATOR 7.0 (GLOVE) ×8
GLOVE ECLIPSE 6.5 STRL STRAW (GLOVE) ×2 IMPLANT
GLOVE ECLIPSE 7.0 STRL STRAW (GLOVE) ×3 IMPLANT
GLOVE SKINSENSE NS SZ7.5 (GLOVE) ×2
GLOVE SKINSENSE STRL SZ7.5 (GLOVE) ×1 IMPLANT
GLOVE SURG SYN 7.5  E (GLOVE) ×2
GLOVE SURG SYN 7.5 E (GLOVE) ×1 IMPLANT
GLOVE SURG SYN 7.5 PF PI (GLOVE) ×1 IMPLANT
GOWN STRL REIN XL XLG (GOWN DISPOSABLE) ×3 IMPLANT
GOWN STRL REUS W/ TWL LRG LVL3 (GOWN DISPOSABLE) ×1 IMPLANT
GOWN STRL REUS W/ TWL XL LVL3 (GOWN DISPOSABLE) ×1 IMPLANT
GOWN STRL REUS W/TWL LRG LVL3 (GOWN DISPOSABLE) ×3
GOWN STRL REUS W/TWL XL LVL3 (GOWN DISPOSABLE) ×3
K-WIRE 1.2 (WIRE) ×3
K-WIRE 1.6X150 (WIRE) ×3
K-WIRE FX150X1.6XSMTH TROC (WIRE) ×1
KWIRE 1.2 (WIRE) IMPLANT
KWIRE FX150X1.6XSMTH TROC (WIRE) IMPLANT
NEEDLE HYPO 22GX1.5 SAFETY (NEEDLE) ×2 IMPLANT
NS IRRIG 1000ML POUR BTL (IV SOLUTION) ×3 IMPLANT
PACK BASIN DAY SURGERY FS (CUSTOM PROCEDURE TRAY) ×3 IMPLANT
PAD CAST 3X4 CTTN HI CHSV (CAST SUPPLIES) IMPLANT
PAD CAST 4YDX4 CTTN HI CHSV (CAST SUPPLIES) IMPLANT
PADDING CAST COTTON 3X4 STRL (CAST SUPPLIES)
PADDING CAST COTTON 4X4 STRL (CAST SUPPLIES) ×3
PADDING CAST SYN 6 (CAST SUPPLIES)
PADDING CAST SYNTHETIC 4 (CAST SUPPLIES)
PADDING CAST SYNTHETIC 4X4 STR (CAST SUPPLIES) IMPLANT
PADDING CAST SYNTHETIC 6X4 NS (CAST SUPPLIES) IMPLANT
PENCIL BUTTON HOLSTER BLD 10FT (ELECTRODE) ×3 IMPLANT
PLATE HALLUX VAL 5H LT (Plate) ×2 IMPLANT
SCREW BONE 10X3.0 (Screw) ×2 IMPLANT
SCREW CANN.3 32/14 (Screw) ×2 IMPLANT
SCREW CANN.3 36/16 (Screw) ×2 IMPLANT
SCREW COUNTERSINK CANN 3 STRL (BIT) IMPLANT
SCREW LOCK 3.0X12MM (Screw) ×2 IMPLANT
SCREW NONLOCK 3.0X12 (Screw) ×4 IMPLANT
SCREW NONLOCK 3.0X14 (Screw) ×4 IMPLANT
SHEET MEDIUM DRAPE 40X70 STRL (DRAPES) ×3 IMPLANT
SLEEVE SCD COMPRESS KNEE MED (MISCELLANEOUS) ×3 IMPLANT
SPLINT FIBERGLASS 3X35 (CAST SUPPLIES) IMPLANT
SPLINT FIBERGLASS 4X30 (CAST SUPPLIES) IMPLANT
SPONGE LAP 18X18 RF (DISPOSABLE) ×3 IMPLANT
SUCTION FRAZIER HANDLE 10FR (MISCELLANEOUS) ×4
SUCTION TUBE FRAZIER 10FR DISP (MISCELLANEOUS) ×1 IMPLANT
SUT ETHILON 3 0 PS 1 (SUTURE) ×2 IMPLANT
SUT VIC AB 0 CT1 27 (SUTURE)
SUT VIC AB 0 CT1 27XBRD ANBCTR (SUTURE) IMPLANT
SUT VIC AB 2-0 CT1 27 (SUTURE) ×3
SUT VIC AB 2-0 CT1 TAPERPNT 27 (SUTURE) ×1 IMPLANT
SUT VIC AB 2-0 SH 27 (SUTURE) ×3
SUT VIC AB 2-0 SH 27XBRD (SUTURE) IMPLANT
SYR BULB 3OZ (MISCELLANEOUS) ×3 IMPLANT
SYR CONTROL 10ML LL (SYRINGE) ×2 IMPLANT
TOWEL GREEN STERILE FF (TOWEL DISPOSABLE) ×5 IMPLANT
TUBE CONNECTING 20'X1/4 (TUBING) ×1
TUBE CONNECTING 20X1/4 (TUBING) ×2 IMPLANT
UNDERPAD 30X30 (UNDERPADS AND DIAPERS) ×3 IMPLANT
YANKAUER SUCT BULB TIP NO VENT (SUCTIONS) ×3 IMPLANT

## 2018-02-03 NOTE — Progress Notes (Signed)
Urine collected  [place], [place], 02/03/2018

## 2018-02-03 NOTE — H&P (Signed)
PREOPERATIVE H&P  Chief Complaint: right great toe hallux rigidus  HPI: Michelle Roberts is a 62 y.o. female who presents for surgical treatment of right great toe hallux rigidus.  She denies any changes in medical history.  Past Medical History:  Diagnosis Date  . Anxiety   . Arthritis   . Depression   . Fibromyalgia   . GERD (gastroesophageal reflux disease)   . Hyperlipidemia   . Hypertension   . Hypothyroidism    thyroidectomy  . Neoplasm of parotid gland   . Neuromuscular disorder ([place])   . Rheumatoid aortitis   . Sleep apnea    uses CPAP sometimes   Past Surgical History:  Procedure Laterality Date  . ABDOMINAL HYSTERECTOMY    . CHOLECYSTECTOMY    . HAMMER TOE SURGERY    . MASS EXCISION N/A 11/17/2017   Procedure: EXCISION SOFT PALATE LESION;  Surgeon: Rozetta Nunnery, MD;  Location: [place];  Service: ENT;  Laterality: N/A;  . PAROTIDECTOMY Left 04/08/2016   Procedure: LEFT SUPERFICIAL PAROTIDECTOMY WITH FACIAL NERVE DISECTION;  Surgeon: Rozetta Nunnery, MD;  Location: [place];  Service: ENT;  Laterality: Left;  . THYROIDECTOMY     Social History   Socioeconomic History  . Marital status: Married    Spouse name: Not on file  . Number of children: Not on file  . Years of education: Not on file  . Highest education level: Not on file  Occupational History  . Not on file  Social Needs  . Financial resource strain: Not on file  . Food insecurity:    Worry: Not on file    Inability: Not on file  . Transportation needs:    Medical: Not on file    Non-medical: Not on file  Tobacco Use  . Smoking status: Never Smoker  . Smokeless tobacco: Never Used  Substance and Sexual Activity  . Alcohol use: No  . Drug use: No  . Sexual activity: Not on file  Lifestyle  . Physical activity:    Days per week: Not on file    Minutes per session: Not on file  . Stress: Not on file  Relationships  . Social connections:      Talks on phone: Not on file    Gets together: Not on file    Attends religious service: Not on file    Active member of club or organization: Not on file    Attends meetings of clubs or organizations: Not on file    Relationship status: Not on file  Other Topics Concern  . Not on file  Social History Narrative  . Not on file   Family History  Problem Relation Age of Onset  . Alzheimer's disease Mother   . Alzheimer's disease Sister    Allergies  Allergen Reactions  . Epinephrine Other (See Comments)    Tachycardia from injection in Dentist Office ([facility]?)   Prior to Admission medications   Medication Sig Start Date End Date Taking? Authorizing Provider  allopurinol (ZYLOPRIM) 300 MG tablet Take 300 mg by mouth daily.   Yes [provider]  amitriptyline (ELAVIL) 25 MG tablet TAKE 1 TABLET BY MOUTH AT  BEDTIME 02/12/17  Yes Deveshwar, Abel Presto, MD  Ascorbic Acid (BL VITAMIN C PO) Take by mouth daily.   Yes [provider]  atorvastatin (LIPITOR) 40 MG tablet  06/20/13  Yes [provider]  Calcium Carbonate-Vit D-Min (CALCIUM 1200 PO) Take by mouth.  Yes [provider]  cholecalciferol (VITAMIN D) 1000 UNITS tablet Take 1,000 Units by mouth daily.   Yes [provider]  Coenzyme Q10 (CO Q-10) 30 MG CAPS Take by mouth.   Yes [provider]  Collagen 500 MG CAPS Take by mouth.   Yes [provider]  diphenhydrAMINE (BENADRYL) 25 mg capsule Take 25 mg by mouth every 6 (six) hours as needed. One hour prior to infusion   Yes [provider]  FERREX 150 150 MG capsule Take 150 mg by mouth 2 (two) times daily. 12/03/16  Yes [provider]  fexofenadine (ALLEGRA) 180 MG tablet Take 180 mg by mouth daily.   Yes [provider]  gabapentin (NEURONTIN) 300 MG capsule TAKE 1 CAPSULE TWICE A DAY  AND 2 CAPSULES AT BEDTIME 06/22/17 01/27/18 Yes Deveshwar, Abel Presto, MD  JARDIANCE 10 MG TABS tablet Take  10 mg by mouth daily. 08/23/17  Yes [provider]  meloxicam (MOBIC) 15 MG tablet Take 15 mg by mouth daily. 02/14/17  Yes [provider]  Milk Thistle 1000 MG CAPS Take by mouth.   Yes [provider]  Multiple Vitamins-Minerals (MULTIVITAMIN ADULT PO) Take by mouth daily.   Yes [provider]  NALTREXONE HCL PO Take 4.5 mg/day by mouth daily.   Yes [provider]  Omega-3 Fatty Acids (OMEGA-3 1450 PO) Take by mouth.   Yes [provider]  [facility] DELICA LANCETS 53I MISC Inject 1 strip into the skin daily. 02/09/17  Yes [provider]  ONETOUCH VERIO test strip Inject 1 application into the skin daily. 02/09/17  Yes [provider]  pantoprazole (PROTONIX) 40 MG tablet  06/20/13  Yes [provider]  Selenium (SELENIMIN PO) Take by mouth.   Yes [provider]  SPIRULINA PO Take by mouth.   Yes [provider]  SYNTHROID 150 MCG tablet  06/26/13  Yes [provider]  Thiamine HCl (B-1) 100 MG TABS Take by mouth.   Yes [provider]  triamterene-hydrochlorothiazide (DYAZIDE) 37.5-25 MG capsule Take 1 capsule by mouth daily.   Yes [provider]  TURMERIC PO Take by mouth.   Yes [provider]  UNABLE TO FIND [place]   Yes [provider]  VAGIFEM 10 MCG TABS vaginal tablet  06/26/13  Yes [provider]  zolpidem (AMBIEN) 10 MG tablet  05/17/13  Yes [provider]  benzonatate (TESSALON PERLES) 100 MG capsule Take 2 capsules (200 mg total) by mouth 2 (two) times daily. 04/09/16   Rozetta Nunnery, MD  fluconazole (DIFLUCAN) 200 MG tablet Take 200 mg by mouth daily. 09/02/17   [provider]  ketoconazole (NIZORAL) 2 % shampoo Apply 1 application topically daily. 01/20/17   [provider]  Lactobacillus (ACIDOPHILUS PO) Take by mouth.    [provider]     Positive ROS: All other systems have  been reviewed and were otherwise negative with the exception of those mentioned in the HPI and as above.  Physical Exam: General: Alert, no acute distress Cardiovascular: No pedal edema Respiratory: No cyanosis, no use of accessory musculature GI: abdomen soft Skin: No lesions in the area of chief complaint Neurologic: Sensation intact distally Psychiatric: Patient is competent for consent with normal mood and affect Lymphatic: no lymphedema  MUSCULOSKELETAL: exam stable  Assessment: right great toe hallux rigidus  Plan: Plan for Procedure(s): RIGHT GREAT TOE CHEILECTOMY VS. FUSION CHEILECTOMY  The risks benefits and alternatives were discussed  with the patient including but not limited to the risks of nonoperative treatment, versus surgical intervention including infection, bleeding, nerve injury,  blood clots, cardiopulmonary complications, morbidity, mortality, among others, and they were willing to proceed.   Eduard Roux, MD   02/03/2018 8:12 AM

## 2018-02-03 NOTE — Anesthesia Postprocedure Evaluation (Signed)
Anesthesia Post Note  Patient: [name]  Procedure(s) Performed: RIGHT GREAT TOE  FUSION (Right Foot)     Patient location during evaluation: PACU Anesthesia Type: Regional and General Level of consciousness: awake and alert Pain management: pain level controlled Vital Signs Assessment: post-procedure vital signs reviewed and stable Respiratory status: spontaneous breathing, nonlabored ventilation, respiratory function stable and patient connected to nasal cannula oxygen Cardiovascular status: blood pressure returned to baseline and stable Postop Assessment: no apparent nausea or vomiting Anesthetic complications: no    Last Vitals:  Vitals:   02/03/18 1130 02/03/18 1215  BP: (!) 145/85 (!) 154/88  Pulse: 75 85  Resp: 17 16  Temp:  36.6 C  SpO2: 97% 99%    Last Pain:  Vitals:   02/03/18 1215  TempSrc:   PainSc: 4                  Ryan P Ellender

## 2018-02-03 NOTE — Anesthesia Procedure Notes (Signed)
Procedure Name: LMA Insertion Date/Time: 02/03/2018 8:44 AM Performed by: Signe Colt, CRNA Pre-anesthesia Checklist: Patient identified, Emergency Drugs available, Suction available and Patient being monitored Patient Re-evaluated:Patient Re-evaluated prior to induction Oxygen Delivery Method: Circle system utilized Preoxygenation: Pre-oxygenation with 100% oxygen Induction Type: IV induction Ventilation: Mask ventilation without difficulty LMA: LMA inserted LMA Size: 4.0 Number of attempts: 1 Airway Equipment and Method: Bite block Placement Confirmation: positive ETCO2 Tube secured with: Tape Dental Injury: Teeth and Oropharynx as per pre-operative assessment

## 2018-02-03 NOTE — Discharge Instructions (Signed)
Postoperative instructions:  Weightbearing instructions: as tolerated in post op shoe  Dressing instructions: Keep your dressing and/or splint clean and dry at all times.  It will be removed at your first post-operative appointment.  Your stitches and/or staples will be removed at this visit.  Incision instructions:  Do not soak your incision for 3 weeks after surgery.  If the incision gets wet, pat dry and do not scrub the incision.  Pain control:  You have been given a prescription to be taken as directed for post-operative pain control.  In addition, elevate the operative extremity above the heart at all times to prevent swelling and throbbing pain.  Take over-the-counter Colace, 100mg  by mouth twice a day while taking narcotic pain medications to help prevent constipation.  Follow up appointments: 1) 12-14 days for suture removal and wound check. 2) Dr. Erlinda Hong as scheduled.   -------------------------------------------------------------------------------------------------------------  After Surgery Pain Control:  After your surgery, post-surgical discomfort or pain is likely. This discomfort can last several days to a few weeks. At certain times of the day your discomfort may be more intense.  Did you receive a nerve block?  A nerve block can provide pain relief for one hour to two days after your surgery. As long as the nerve block is working, you will experience little or no sensation in the area the surgeon operated on.  As the nerve block wears off, you will begin to experience pain or discomfort. It is very important that you begin taking your prescribed pain medication before the nerve block fully wears off. Treating your pain at the first sign of the block wearing off will ensure your pain is better controlled and more tolerable when full-sensation returns. Do not wait until the pain is intolerable, as the medicine will be less effective. It is better to treat pain in advance than to  try and catch up.  General Anesthesia:  If you did not receive a nerve block during your surgery, you will need to start taking your pain medication shortly after your surgery and should continue to do so as prescribed by your surgeon.  Pain Medication:  Most commonly we prescribe Vicodin and Percocet for post-operative pain. Both of these medications contain a combination of acetaminophen (Tylenol) and a narcotic to help control pain.   It takes between 30 and 45 minutes before pain medication starts to work. It is important to take your medication before your pain level gets too intense.   Nausea is a common side effect of many pain medications. You will want to eat something before taking your pain medicine to help prevent nausea.   If you are taking a prescription pain medication that contains acetaminophen, we recommend that you do not take additional over the counter acetaminophen (Tylenol).  Other pain relieving options:   Using a cold pack to ice the affected area a few times a day (15 to 20 minutes at a time) can help to relieve pain, reduce swelling and bruising.   Elevation of the affected area can also help to reduce pain and swelling.       No ibuprofen before 5:15pm Tylenol 1,000mg  given at 11:30am; none before 3:30pm   Post Anesthesia Home Care Instructions  Activity: Get plenty of rest for the remainder of the day. A responsible individual must stay with you for 24 hours following the procedure.  For the next 24 hours, DO NOT: -Drive a car -Paediatric nurse -Drink alcoholic beverages -Take any medication unless instructed by  your physician -Make any legal decisions or sign important papers.  Meals: Start with liquid foods such as gelatin or soup. Progress to regular foods as tolerated. Avoid greasy, spicy, heavy foods. If nausea and/or vomiting occur, drink only clear liquids until the nausea and/or vomiting subsides. Call your physician if vomiting  continues.  Special Instructions/Symptoms: Your throat may feel dry or sore from the anesthesia or the breathing tube placed in your throat during surgery. If this causes discomfort, gargle with warm salt water. The discomfort should disappear within 24 hours.  If you had a scopolamine patch placed behind your ear for the management of post- operative nausea and/or vomiting:  1. The medication in the patch is effective for 72 hours, after which it should be removed.  Wrap patch in a tissue and discard in the trash. Wash hands thoroughly with soap and water. 2. You may remove the patch earlier than 72 hours if you experience unpleasant side effects which may include dry mouth, dizziness or visual disturbances. 3. Avoid touching the patch. Wash your hands with soap and water after contact with the patch.    Regional Anesthesia Blocks  1. Numbness or the inability to move the "blocked" extremity may last from 3-48 hours after placement. The length of time depends on the medication injected and your individual response to the medication. If the numbness is not going away after 48 hours, call your surgeon.  2. The extremity that is blocked will need to be protected until the numbness is gone and the  Strength has returned. Because you cannot feel it, you will need to take extra care to avoid injury. Because it may be weak, you may have difficulty moving it or using it. You may not know what position it is in without looking at it while the block is in effect.  3. For blocks in the legs and feet, returning to weight bearing and walking needs to be done carefully. You will need to wait until the numbness is entirely gone and the strength has returned. You should be able to move your leg and foot normally before you try and bear weight or walk. You will need someone to be with you when you first try to ensure you do not fall and possibly risk injury.  4. Bruising and tenderness at the needle site are  common side effects and will resolve in a few days.  5. Persistent numbness or new problems with movement should be communicated to the surgeon or the [facility] [phone] [place] [phone]).

## 2018-02-03 NOTE — Op Note (Signed)
   Excision of right great toe osteophyte Procedure Note   Pre-op Diagnosis: right great toe hallux rigidus  Post-op Diagnosis: same   Operative Procedures  1. right great toe MTP joint arthrodesis    Personnel  Surgeon(s):   Eduard Roux, MD   Assistant: Tawanna Cooler, PA-C, necessary for the timely completion of the procedure  Anesthesia: general and popliteal block  Indications for procedure: MAHEEN CWIKLA is a 62 y.o. female with a symptomatic hallux rigidus which has failed conservative therapy. The risks, benefits, and alternatives to surgery were discussed and she agreed to proceed with the above mentioned procedure.   Description of procedure: ANJELIQUE MAKAR was identified in the holding area. The operative site was confirmed and marked by the surgeon. Patient was brought back to the operating room. A timeout was performed to confirm the correct patient, operative site, operative procedure and allergies. A nonsterile tourniquet was placed on the upper right thigh.  The right lower extremity was prepped and draped in standard sterile fashion. The extremity was exsanguinated with an esmarch.  A dorsomedial incision based over the great toe metatarsophalangeal (MTP) joint was used. Blunt dissection was carried down to the capsule. The extensor hallucis longus tendon was protected and retracted. The capsule was sharply incised and elevated off of the metatarsal and proximal phalanx. The dorsal osteophyte was then visualized and removed with a rongeur. The MTP joint was inspected and demonstrated complete loss of articular cartilage on both sides.  The joint surfaces were then prepared with the corresponding cup and cone reamers down to bleeding bone.  The peripheral osteophytes were removed.  With the great toe in the appropriate fusion position, a single K wire was placed in a retrograde fashion from the proximal phalanx into the 1st metatarsal using fluoroscopic guidance.  A  partially threaded cannulated screw was then placed over the wire to gain compression across the fusion site with excellent bony apposition.  I then placed a locking plate on the dorsomedial aspect of the MTP joint in order to provide extra fixation.  Two nonlocking screws were then placed through the plate both proximal and distal to the MTP joint each with excellent purchase.  The sharp ends of the bone were smoothed down with a rasp. . The tourniquet was deflated. Hemostasis was obtained.  The capsule was closed with 2-0 vicryl. The skin was closed with 3-0 nylon. A sterile dressing was applied. The patient was awakened in the operating room and taken to recovery in stable condition. All sponge, needle, and instrument counts were correct at the end of the case.   Position: supine   Complications: none.   Time Out: performed   Drains/Packing: none   Estimated blood loss: minimal   Returned to Recovery Room: in good condition.   Antibiotics: yes   Fluid Replacement  Crystalloid: see anesthesia records  Blood: none  FFP: none  Specimens Removed: none  Sponge and Instrument Count Correct? yes  Plan/RTC: Return in 1 week for dressing removal and initiation of therapy.  Weight Bearing/Load Lower Extremity: full in postop shoe

## 2018-02-03 NOTE — Progress Notes (Signed)
Assisted Dr. Ellender with right, ultrasound guided, popliteal block. Side rails up, monitors on throughout procedure. See vital signs in flow sheet. Tolerated Procedure well. ?

## 2018-02-03 NOTE — Anesthesia Procedure Notes (Signed)
Anesthesia Regional Block: Popliteal block   Pre-Anesthetic Checklist: ,, timeout performed, Correct Patient, Correct Site, Correct Laterality, Correct Procedure,, site marked, risks and benefits discussed, Surgical consent,  Pre-op evaluation,  At surgeon's request and post-op pain management  Laterality: Right  Prep: chloraprep       Needles:  Injection technique: Single-shot  Needle Type: Echogenic Stimulator Needle     Needle Length: 10cm  Needle Gauge: 21     Additional Needles:   Procedures:,,,, ultrasound used (permanent image in chart),,,,  Narrative:  Start time: 02/03/2018 8:15 AM End time: 02/03/2018 8:25 AM Injection made incrementally with aspirations every 5 mL.  Performed by: Personally  Anesthesiologist: Murvin Natal, MD  Additional Notes: Functioning IV was confirmed and monitors were applied.  A 131mm 21ga [place] echogenic stimulator needle was used. Sterile prep, hand hygiene and sterile gloves were used.  Negative aspiration and negative test dose prior to incremental administration of local anesthetic. The patient tolerated the procedure well.

## 2018-02-03 NOTE — Transfer of Care (Signed)
Immediate Anesthesia Transfer of Care Note  Patient: Michelle Roberts  Procedure(s) Performed: RIGHT GREAT TOE  FUSION (Right Foot)  Patient Location: PACU  Anesthesia Type:GA combined with regional for post-op pain  Level of Consciousness: drowsy and patient cooperative  Airway & Oxygen Therapy: Patient Spontanous Breathing and Patient connected to face mask oxygen  Post-op Assessment: Report given to RN and Post -op Vital signs reviewed and stable  Post vital signs: Reviewed and stable  Last Vitals:  Vitals Value Taken Time  BP    Temp    Pulse    Resp    SpO2      Last Pain:  Vitals:   02/03/18 0726  TempSrc: Oral  PainSc: 1       Patients Stated Pain Goal: 1 (29/98/06 9996)  Complications: No apparent anesthesia complications

## 2018-02-04 ENCOUNTER — Telehealth (INDEPENDENT_AMBULATORY_CARE_PROVIDER_SITE_OTHER): Payer: Self-pay

## 2018-02-04 ENCOUNTER — Other Ambulatory Visit (INDEPENDENT_AMBULATORY_CARE_PROVIDER_SITE_OTHER): Payer: Self-pay

## 2018-02-04 MED ORDER — IBUPROFEN 800 MG PO TABS: 800.0000 mg | ORAL_TABLET | Freq: Three times a day (TID) | ORAL | 0 refills | Status: DC | PRN

## 2018-02-04 NOTE — Telephone Encounter (Signed)
Pt called stating Percocet is not touching the pain. She is icing and elevating but still in a great deal of pain. Please call to discuss.

## 2018-02-04 NOTE — Telephone Encounter (Signed)
Called patient she is aware.

## 2018-02-04 NOTE — Telephone Encounter (Signed)
Advil 800 mg tid.  Should pass in a day or so.

## 2018-02-04 NOTE — Telephone Encounter (Signed)
Tid prn.  #60

## 2018-02-04 NOTE — Telephone Encounter (Signed)
Would like Motrin 800 mg sent to pharm. Is this okay if so, what instructions  and quantity?     Pharm [facility]

## 2018-02-04 NOTE — Telephone Encounter (Signed)
See message below  Please advise

## 2018-02-05 NOTE — Addendum Note (Signed)
Addendum  created 02/05/18 1247 by Tawni Millers, CRNA   Charge Capture section accepted, Visit diagnoses modified

## 2018-02-09 ENCOUNTER — Ambulatory Visit (HOSPITAL_BASED_OUTPATIENT_CLINIC_OR_DEPARTMENT_OTHER): Payer: Self-pay | Admitting: Internal Medicine

## 2018-02-09 ENCOUNTER — Other Ambulatory Visit (HOSPITAL_BASED_OUTPATIENT_CLINIC_OR_DEPARTMENT_OTHER): Payer: Self-pay | Admitting: Internal Medicine

## 2018-02-09 ENCOUNTER — Ambulatory Visit: Payer: 59 | Attending: Internal Medicine

## 2018-02-09 ENCOUNTER — Encounter (HOSPITAL_BASED_OUTPATIENT_CLINIC_OR_DEPARTMENT_OTHER): Payer: Self-pay | Admitting: Orthopaedic Surgery

## 2018-02-09 DIAGNOSIS — F1120 Opioid dependence, uncomplicated: Secondary | ICD-10-CM | POA: Insufficient documentation

## 2018-02-09 LAB — FENTANYL URINE: FENTANYL URINE: NEGATIVE

## 2018-02-09 LAB — OPIATES URINE: OPIATES URINE: NEGATIVE

## 2018-02-09 LAB — OXYCODONE SCREEN URINE
OXYCOD SCRN URINE: NEGATIVE
OXYCOD UR SPEC GRAV: 1.008 (ref 1.003–1.035)
OXYCOD URINE CREAT: 45 md/dL
OXYCOD URINE PH: 5.5 (ref 4.5–8.5)

## 2018-02-09 LAB — ETHANOL URINE: ETHANOL URINE: NEGATIVE

## 2018-02-09 LAB — CANNABINOIDS URINE: CANNABINOIDS URINE: POSITIVE — AB

## 2018-02-09 LAB — AMPHETAMINES URINE: AMPHETAMINES URINE: NEGATIVE

## 2018-02-09 LAB — COCAINE METABOLITES URINE: COCAINE METABOLITES URINE: NEGATIVE

## 2018-02-09 LAB — METHADONE URINE: METHADONE URINE: NEGATIVE

## 2018-02-09 LAB — BENZODIAZEPINES URINE: BENZODIAZEPINES URINE: NEGATIVE

## 2018-02-09 LAB — BUPRENORPHINE SCREEN URINE: BUPRENORPHINE SCREEN URINE: POSITIVE

## 2018-02-09 MED ORDER — BUPRENORPHINE HCL-NALOXONE HCL 8-2 MG SL FILM
2.0000 | ORAL_FILM | Freq: Every day | SUBLINGUAL | 0 refills | Status: DC
Start: 2018-02-09 — End: 2018-02-15

## 2018-02-09 MED ORDER — BUPRENORPHINE HCL-NALOXONE HCL 8-2 MG SL FILM: 2 | Film | Freq: Every day | SUBLINGUAL | 0 refills | 0 days | Status: AC

## 2018-02-09 NOTE — Progress Notes (Signed)
PER Patient (self), Cheryl Dixon is a 62 year old female has requested a refill of Suboxone .    - last prescribed   Start Date: 02/02/18 End Date: 02/09/18           Last Office Visit: 01/12/18 with Roll, D  Last Physical Exam: 05/27/13    COLONOSCOPY due on 08/22/2017    Other Med Adult:  Most Recent BP Reading(s)  01/21/18 : 140/96        Cholesterol (mg/dl)   Date Value   01/04/2010 264 (H)     LOW DENSITY LIPOPROTEIN DIRECT (mg/dl)   Date Value   01/04/2010 101 (H)     HIGH DENSITY LIPOPROTEIN (mg/dl)   Date Value   01/04/2010 38     No results found for: TG      THYROID SCREEN TSH REFLEX FT4 (uIU/mL)   Date Value   05/27/2013 2.130         No results found for: TSH    HEMOGLOBIN A1C (%)   Date Value   05/27/2013 5.9 (H)       No results found for: POCA1C      INR (no units)   Date Value   02/16/2007 1.0 (L)   07/03/2006 < 1.0 (L)       SODIUM (mmol/L)   Date Value   07/17/2014 140       POTASSIUM (mmol/L)   Date Value   07/17/2014 4.2           CREATININE (mg/dL)   Date Value   07/17/2014 1.0       Documented patient preferred pharmacies:    [facility], [place], [place]  Phone: [phone] Fax: [phone]

## 2018-02-09 NOTE — Progress Notes (Signed)
Prescription has been sent electronically to the preferred pharmacy and may be picked up at the pharmacy on the date when it is due.    Patients who need refills of controlled substances should call at least three business days before they need a new prescription so that there is adequate time to process the request.    PMP reviewed.  No unauthorized prescriptions since last refill.  Recent UDS reviewed, results as expected.

## 2018-02-09 NOTE — Progress Notes (Signed)
Urine specimen collected and sent to lab  [facility], [place], 02/09/2018

## 2018-02-12 ENCOUNTER — Other Ambulatory Visit (HOSPITAL_BASED_OUTPATIENT_CLINIC_OR_DEPARTMENT_OTHER): Payer: Self-pay | Admitting: Internal Medicine

## 2018-02-12 MED ORDER — NICOTINE 21 MG/24HR TD PT24
1.0000 | MEDICATED_PATCH | Freq: Every day | TRANSDERMAL | 1 refills | Status: DC
Start: 2018-02-12 — End: 2018-05-05

## 2018-02-12 MED ORDER — NICOTINE 21 MG/24HR TD PT24: 1 | patch | Freq: Every day | TRANSDERMAL | 1 refills | 0 days | Status: AC

## 2018-02-12 NOTE — Progress Notes (Signed)
PER Patient (self), Cheryl Dixon is a 62 year old female has requested a refill of nicotine.      Last Office Visit: 01/12/18 with PCP  Last Physical Exam: 05/27/13    COLONOSCOPY due on 08/22/2017    Other Med Adult:  Most Recent BP Reading(s)  01/21/18 : 140/96        Cholesterol (mg/dl)   Date Value   01/04/2010 264 (H)     LOW DENSITY LIPOPROTEIN DIRECT (mg/dl)   Date Value   01/04/2010 101 (H)     HIGH DENSITY LIPOPROTEIN (mg/dl)   Date Value   01/04/2010 38     No results found for: TG      THYROID SCREEN TSH REFLEX FT4 (uIU/mL)   Date Value   05/27/2013 2.130         No results found for: TSH    HEMOGLOBIN A1C (%)   Date Value   05/27/2013 5.9 (H)       No results found for: POCA1C      INR (no units)   Date Value   02/16/2007 1.0 (L)   07/03/2006 < 1.0 (L)       SODIUM (mmol/L)   Date Value   07/17/2014 140       POTASSIUM (mmol/L)   Date Value   07/17/2014 4.2           CREATININE (mg/dL)   Date Value   07/17/2014 1.0       Documented patient preferred pharmacies:    [facility], [place], [place]  Phone: [phone] Fax: [phone]

## 2018-02-15 ENCOUNTER — Other Ambulatory Visit (HOSPITAL_BASED_OUTPATIENT_CLINIC_OR_DEPARTMENT_OTHER): Payer: Self-pay | Admitting: Family Medicine

## 2018-02-15 DIAGNOSIS — F1120 Opioid dependence, uncomplicated: Secondary | ICD-10-CM

## 2018-02-15 MED ORDER — BUPRENORPHINE HCL-NALOXONE HCL 8-2 MG SL FILM
2.0000 | ORAL_FILM | Freq: Every day | SUBLINGUAL | 0 refills | Status: DC
Start: 2018-02-15 — End: 2018-02-16

## 2018-02-16 ENCOUNTER — Ambulatory Visit (HOSPITAL_BASED_OUTPATIENT_CLINIC_OR_DEPARTMENT_OTHER): Payer: Commercial Managed Care - HMO | Admitting: Internal Medicine

## 2018-02-16 ENCOUNTER — Ambulatory Visit: Payer: 59 | Attending: Family Medicine | Admitting: Family Medicine

## 2018-02-16 DIAGNOSIS — F1120 Opioid dependence, uncomplicated: Secondary | ICD-10-CM | POA: Insufficient documentation

## 2018-02-16 MED ORDER — BUPRENORPHINE HCL-NALOXONE HCL 8-2 MG SL FILM
2.0000 | ORAL_FILM | Freq: Every day | SUBLINGUAL | 0 refills | Status: DC
Start: 2018-02-16 — End: 2018-03-08

## 2018-02-16 NOTE — Progress Notes (Signed)
Cheryl Dixon is a 62 year old female seen in follow up for opioid dependence:    Buprenorphine dose: 16mg   Response, adequacy of dose: good  Relapses/close calls: none  Trigger: none  Meetings: no    Social history/events:  Doing well - spending a lot of time with her grandkids  Son is doing a bit better  Happy to move to the earlier time    Present Medications:    Current Outpatient Medications on File Prior to Visit:  buprenorphine-naloxone (SUBOXONE) 8-2 MG sublingual film Place 2 Film under the tongue daily Max Daily Amount: 2 Film  for 7 days Disp: 14 Film Rfl: 0   nicotine (NICODERM CQ) 21 MG/24HR Place 1 patch onto the skin daily Disp: 30 patch Rfl: 1   naproxen (NAPROSYN) 500 MG tablet Take 1 tablet by mouth every 12 (twelve) hours as needed for Pain as needed for pain. Take with food  for up to 10 days Disp: 20 tablet Rfl: 0   buPROPion (WELLBUTRIN SR) 200 MG 12 hr tablet Take 1 tablet by mouth 2 (two) times daily Disp: 60 tablet Rfl: 2   polyethylene glycol (MIRALAX) powder Take 17 g by mouth daily Mix in 64 ounces of clear liquid and take as directed Disp: 500 g Rfl: 5   INCRUSE ELLIPTA 62.5 MCG/INH inhaler INHALE 1 PUFF BY MOUTH INTO LUNGS DAILY Disp: 1 Inhaler Rfl: 11   albuterol (PROVENTIL HFA,VENTOLIN HFA, PROAIR HFA) 108 (90 Base) MCG/ACT inhaler Inhale 2 puffs into the lungs every 6 (six) hours as needed for Wheezing or Shortness of breath Disp: 1 Inhaler Rfl: 2   naproxen (NAPROSYN) 500 MG tablet Take 1 tablet by mouth 2 (two) times daily with meals for 15 days Disp: 30 tablet Rfl: 0   ipratropium-albuterol (DUO-NEB) 0.5-2.5 (3) MG/3ML SOLN Inhalation Solution Take 3 mLs by nebulization 4 (four) times daily. Disp: 180 mL Rfl: 0     No current facility-administered medications on file prior to visit.     Review of Systems:  Neurological exam: negative  PHQ-9 TOTAL SCORE 03/03/2017 01/05/2017 11/27/2016   Doc FlowSheet Total Score - - -   Doc FlowSheet Total Score 4 22 18            PHYSICAL  EXAMINATION:  General appearance - healthy female in no distress  Eyes - pupils 71mm  Skin - warm and dry  Neuro - nonfocal  Affect - normal    PMP reviewed. No unauthorized prescriptions since last refill.     Assessment:  Opioid Dependence  Comment: condition gradually improving on current dose, actively participating in group.  Plan:  Continue buprenorphine, reviewed criteria for tapering when patient is ready.    Patient is counseled regarding relapse prevention, involvement in recovery groups, potential side effects of buprenorphine, and eventual taper of medication.    1. The patient indicates understanding of these issues and agrees with the plan.  2.  The patient is given an After Visit Summary sheet that lists all of their medications with directions, their allergies, orders placed during this encounter, immunization dates, and follow- up instructions.  3. I reviewed the patient's medical information and medical history   4.  I reconciled the patient's medication list and prepared and supplied needed refills.  5.  I have reviewed the past medical, family, and social history sections including the medications and allergies listed in the above medical record  [name], MD, 02/16/2018

## 2018-02-17 ENCOUNTER — Encounter (INDEPENDENT_AMBULATORY_CARE_PROVIDER_SITE_OTHER): Payer: Self-pay | Admitting: Orthopaedic Surgery

## 2018-02-17 ENCOUNTER — Ambulatory Visit (INDEPENDENT_AMBULATORY_CARE_PROVIDER_SITE_OTHER): Payer: 59 | Admitting: Physician Assistant

## 2018-02-17 ENCOUNTER — Ambulatory Visit (INDEPENDENT_AMBULATORY_CARE_PROVIDER_SITE_OTHER): Payer: Self-pay

## 2018-02-17 DIAGNOSIS — M2021 Hallux rigidus, right foot: Secondary | ICD-10-CM | POA: Diagnosis not present

## 2018-02-17 LAB — OXYCODONE SCREEN URINE
OXYCOD SCRN URINE: NEGATIVE
OXYCOD UR SPEC GRAV: 1.01 (ref 1.003–1.035)
OXYCOD URINE CREAT: 137 md/dL
OXYCOD URINE PH: 7 (ref 4.5–8.5)

## 2018-02-17 LAB — BUPRENORPHINE SCREEN URINE: BUPRENORPHINE SCREEN URINE: POSITIVE

## 2018-02-17 LAB — COCAINE METABOLITES URINE: COCAINE METABOLITES URINE: NEGATIVE

## 2018-02-17 LAB — ETHANOL URINE: ETHANOL URINE: NEGATIVE

## 2018-02-17 LAB — BENZODIAZEPINES URINE: BENZODIAZEPINES URINE: NEGATIVE

## 2018-02-17 LAB — OPIATES URINE: OPIATES URINE: NEGATIVE

## 2018-02-17 NOTE — Progress Notes (Signed)
Post-Op Visit Note   Patient: Michelle Roberts           Date of Birth: 11-10-1955           MRN: [id number] Visit Date: 02/17/2018 PCP: Antony Contras, MD   Assessment & Plan:  Chief Complaint:  Chief Complaint  Patient presents with  . Right Foot - Pain, Routine Post Op    RIGHT GREAT TOE.    Visit Diagnoses:  1. Hallux rigidus, right foot     Plan: Patient is a pleasant 62 year old female who presents our clinic today 14 days status post right great toe fusion, date of surgery 02/13/2018.  She has been occasionally weightbearing but for the majority of the time using a knee scooter for ambulation.  She has been compliant in her postop shoe.  She has had continued pain to the great toe worse at night.  She has been taking oxycodone which does not seem to help her pain.  Her side effects far outweigh the relief.  She is taking Tylenol and ibuprofen with mild relief of symptoms.  Overall, her symptoms have improved over the past few days.  No fevers or chills.  Examination of her right toe reveals a well-healing surgical incision with nylon sutures in place.  No evidence of infection.  She is neurovascularly intact distally.  At this point, she will continue to weight-bear as tolerated in her postop shoe for another 4 weeks.  We will go ahead and send her to outpatient physical therapy for range of motion as tolerated.  A prescription was given to the patient today for this.  She will follow-up with [place] in 4 weeks time for repeat evaluation and x-ray.  Follow-Up Instructions: Return in about 4 weeks (around 03/17/2018).   Orders:  Orders Placed This Encounter  Procedures  . XR Toe Great Right   No orders of the defined types were placed in this encounter.   Imaging: Xr Toe Great Right  Result Date: 02/17/2018 X-rays demonstrate a stable fusion right great toe   PMFS History: Patient Active Problem List   Diagnosis Date Noted  . Hallux rigidus, right foot 02/03/2018  . 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 Fibromyalgia 08/12/2016  . High risk medication use 08/12/2016  . Primary osteoarthritis of both hands 08/12/2016  . Acute midline low back pain 08/12/2016  . DDD (degenerative disc disease), lumbar 08/12/2016  . History of hypertension 08/12/2016  . History of high cholesterol 08/12/2016  . Thyroid ca ([place]) 08/12/2016  . History of hypothyroidism 08/12/2016  . History of gastroesophageal reflux (GERD) 08/12/2016  . History of TMJ syndrome 08/12/2016  . Burning tongue syndrome 08/12/2016  . History of vitamin D deficiency 08/12/2016  . Other sleep apnea 08/12/2016  . Vitamin D deficiency 08/12/2016  . Rheumatoid arthritis with rheumatoid factor of multiple sites without organ or systems involvement ([place]) 12/31/2015  . Goals of care, counseling/discussion 06/02/2013  . Myofascial muscle pain 06/02/2013  . Narcotic-induced mood disorder ([place]) 06/02/2013  . Inflammatory arthritis 04/08/2011  . Plantar fasciitis 04/08/2011  . Yeast infection 04/08/2011   Past Medical History:  Diagnosis Date  . Anxiety   . Arthritis   . Depression   . Fibromyalgia   . GERD (gastroesophageal reflux disease)   . Hyperlipidemia   . Hypertension   . Hypothyroidism    thyroidectomy  . Neoplasm of parotid gland   . Neuromuscular disorder ([place])   . Rheumatoid aortitis   . Sleep  apnea    uses CPAP sometimes    Family History  Problem Relation Age of Onset  . Alzheimer's disease Mother   . Alzheimer's disease Sister     Past Surgical History:  Procedure Laterality Date  . ABDOMINAL HYSTERECTOMY    . CHOLECYSTECTOMY    . HAMMER TOE SURGERY    . MASS EXCISION N/A 11/17/2017   Procedure: EXCISION SOFT PALATE LESION;  Surgeon: Rozetta Nunnery, MD;  Location: [facility];  Service: ENT;  Laterality: N/A;  . PAROTIDECTOMY Left 04/08/2016   Procedure: LEFT SUPERFICIAL PAROTIDECTOMY WITH FACIAL NERVE DISECTION;  Surgeon: Rozetta Nunnery,  MD;  Location: [facility];  Service: ENT;  Laterality: Left;  . TARSAL METATARSAL ARTHRODESIS Right 02/03/2018   Procedure: RIGHT GREAT TOE  FUSION;  Surgeon: Leandrew Koyanagi, MD;  Location: [facility];  Service: Orthopedics;  Laterality: Right;  . THYROIDECTOMY     Social History   Occupational History  . Not on file  Tobacco Use  . Smoking status: Never Smoker  . Smokeless tobacco: Never Used  Substance and Sexual Activity  . Alcohol use: No  . Drug use: No  . Sexual activity: Not on file

## 2018-02-19 DIAGNOSIS — E7800 Pure hypercholesterolemia, unspecified: Secondary | ICD-10-CM | POA: Diagnosis not present

## 2018-02-19 DIAGNOSIS — M25471 Effusion, right ankle: Secondary | ICD-10-CM | POA: Diagnosis not present

## 2018-02-19 DIAGNOSIS — M25671 Stiffness of right ankle, not elsewhere classified: Secondary | ICD-10-CM | POA: Diagnosis not present

## 2018-02-19 DIAGNOSIS — M25571 Pain in right ankle and joints of right foot: Secondary | ICD-10-CM | POA: Diagnosis not present

## 2018-02-19 DIAGNOSIS — E890 Postprocedural hypothyroidism: Secondary | ICD-10-CM | POA: Diagnosis not present

## 2018-02-19 DIAGNOSIS — C73 Malignant neoplasm of thyroid gland: Secondary | ICD-10-CM | POA: Diagnosis not present

## 2018-02-19 DIAGNOSIS — E1165 Type 2 diabetes mellitus with hyperglycemia: Secondary | ICD-10-CM | POA: Diagnosis not present

## 2018-02-22 DIAGNOSIS — M25471 Effusion, right ankle: Secondary | ICD-10-CM | POA: Diagnosis not present

## 2018-02-22 DIAGNOSIS — M25571 Pain in right ankle and joints of right foot: Secondary | ICD-10-CM | POA: Diagnosis not present

## 2018-02-22 DIAGNOSIS — M25671 Stiffness of right ankle, not elsewhere classified: Secondary | ICD-10-CM | POA: Diagnosis not present

## 2018-02-24 DIAGNOSIS — M25471 Effusion, right ankle: Secondary | ICD-10-CM | POA: Diagnosis not present

## 2018-02-24 DIAGNOSIS — M25571 Pain in right ankle and joints of right foot: Secondary | ICD-10-CM | POA: Diagnosis not present

## 2018-02-24 DIAGNOSIS — M25671 Stiffness of right ankle, not elsewhere classified: Secondary | ICD-10-CM | POA: Diagnosis not present

## 2018-02-26 DIAGNOSIS — C73 Malignant neoplasm of thyroid gland: Secondary | ICD-10-CM | POA: Diagnosis not present

## 2018-02-26 DIAGNOSIS — E890 Postprocedural hypothyroidism: Secondary | ICD-10-CM | POA: Diagnosis not present

## 2018-02-26 DIAGNOSIS — E1165 Type 2 diabetes mellitus with hyperglycemia: Secondary | ICD-10-CM | POA: Diagnosis not present

## 2018-02-26 DIAGNOSIS — M25671 Stiffness of right ankle, not elsewhere classified: Secondary | ICD-10-CM | POA: Diagnosis not present

## 2018-02-26 DIAGNOSIS — M25471 Effusion, right ankle: Secondary | ICD-10-CM | POA: Diagnosis not present

## 2018-02-26 DIAGNOSIS — M25571 Pain in right ankle and joints of right foot: Secondary | ICD-10-CM | POA: Diagnosis not present

## 2018-03-01 DIAGNOSIS — M25571 Pain in right ankle and joints of right foot: Secondary | ICD-10-CM | POA: Diagnosis not present

## 2018-03-01 DIAGNOSIS — M25471 Effusion, right ankle: Secondary | ICD-10-CM | POA: Diagnosis not present

## 2018-03-01 DIAGNOSIS — M25671 Stiffness of right ankle, not elsewhere classified: Secondary | ICD-10-CM | POA: Diagnosis not present

## 2018-03-03 DIAGNOSIS — M25671 Stiffness of right ankle, not elsewhere classified: Secondary | ICD-10-CM | POA: Diagnosis not present

## 2018-03-03 DIAGNOSIS — M25571 Pain in right ankle and joints of right foot: Secondary | ICD-10-CM | POA: Diagnosis not present

## 2018-03-03 DIAGNOSIS — M25471 Effusion, right ankle: Secondary | ICD-10-CM | POA: Diagnosis not present

## 2018-03-05 ENCOUNTER — Telehealth (INDEPENDENT_AMBULATORY_CARE_PROVIDER_SITE_OTHER): Payer: Self-pay | Admitting: Orthopaedic Surgery

## 2018-03-05 NOTE — Telephone Encounter (Signed)
She doesn't have to wear one, but if she wants we can give her a 4inch with velcro

## 2018-03-05 NOTE — Telephone Encounter (Signed)
IC advised. She will decide if she wants an ace. If she does, she can just come by office and pick up 4in ace wrap

## 2018-03-05 NOTE — Telephone Encounter (Signed)
Does patient still need to wrap with ace?

## 2018-03-05 NOTE — Telephone Encounter (Signed)
Patient called and stated needed a ace bandage with the Velcro for her foot(right).She bought one and the pins continuously come out. She thinks the one she had would last but due to taking it on and off so much and caused the Velcro to not attach.  She is willing to pay for it.   Please call patient to advise. [phone]

## 2018-03-08 ENCOUNTER — Other Ambulatory Visit (HOSPITAL_BASED_OUTPATIENT_CLINIC_OR_DEPARTMENT_OTHER): Payer: Self-pay | Admitting: Family Medicine

## 2018-03-08 DIAGNOSIS — F1120 Opioid dependence, uncomplicated: Secondary | ICD-10-CM

## 2018-03-08 DIAGNOSIS — M25571 Pain in right ankle and joints of right foot: Secondary | ICD-10-CM | POA: Diagnosis not present

## 2018-03-08 DIAGNOSIS — M25671 Stiffness of right ankle, not elsewhere classified: Secondary | ICD-10-CM | POA: Diagnosis not present

## 2018-03-08 DIAGNOSIS — M25471 Effusion, right ankle: Secondary | ICD-10-CM | POA: Diagnosis not present

## 2018-03-08 MED ORDER — BUPRENORPHINE HCL-NALOXONE HCL 8-2 MG SL FILM
2.0000 | ORAL_FILM | Freq: Every day | SUBLINGUAL | 0 refills | Status: DC
Start: 2018-03-08 — End: 2018-03-22

## 2018-03-08 MED ORDER — BUPRENORPHINE HCL-NALOXONE HCL 8-2 MG SL FILM: 2 | Film | Freq: Every day | SUBLINGUAL | 0 refills | 0 days | Status: AC

## 2018-03-09 ENCOUNTER — Telehealth (HOSPITAL_BASED_OUTPATIENT_CLINIC_OR_DEPARTMENT_OTHER): Payer: Self-pay

## 2018-03-09 ENCOUNTER — Ambulatory Visit (HOSPITAL_BASED_OUTPATIENT_CLINIC_OR_DEPARTMENT_OTHER): Payer: Commercial Managed Care - HMO | Admitting: Internal Medicine

## 2018-03-09 ENCOUNTER — Encounter (HOSPITAL_BASED_OUTPATIENT_CLINIC_OR_DEPARTMENT_OTHER): Payer: Self-pay | Admitting: Internal Medicine

## 2018-03-09 ENCOUNTER — Ambulatory Visit: Payer: 59 | Attending: Internal Medicine | Admitting: Internal Medicine

## 2018-03-09 DIAGNOSIS — J449 Chronic obstructive pulmonary disease, unspecified: Secondary | ICD-10-CM | POA: Diagnosis not present

## 2018-03-09 DIAGNOSIS — F17200 Nicotine dependence, unspecified, uncomplicated: Secondary | ICD-10-CM | POA: Diagnosis not present

## 2018-03-09 DIAGNOSIS — F1120 Opioid dependence, uncomplicated: Secondary | ICD-10-CM | POA: Diagnosis not present

## 2018-03-09 MED ORDER — NICOTINE 10 MG IN INHA: 1 | each | RESPIRATORY_TRACT | 1 refills | 0 days | Status: AC | PRN

## 2018-03-09 MED ORDER — NICOTINE 10 MG IN INHA
1.00 | RESPIRATORY_TRACT | 1 refills | Status: AC | PRN
Start: 2018-03-09 — End: 2018-05-08

## 2018-03-09 NOTE — Progress Notes (Signed)
Patient called states bus never came wants to come to 530pm group

## 2018-03-09 NOTE — Progress Notes (Addendum)
Melissaann Dizdarevic is a 62 year old female seen in follow up for opioid dependence:    Buprenorphine dose: 16 mg  Response, adequacy of dose: good  Relapses/close calls: none  Trigger: n/a  Meetings: no    Social history/events:  Things have settled down at home  22 yo son is sober for 2 months after being sectioned    Present Medications:    Current Outpatient Medications on File Prior to Visit:  buprenorphine-naloxone (SUBOXONE) 8-2 MG sublingual film Place 2 Film under the tongue daily Max Daily Amount: 2 Film  for 14 days Disp: 28 Film Rfl: 0   nicotine (NICODERM CQ) 21 MG/24HR Place 1 patch onto the skin daily Disp: 30 patch Rfl: 1   naproxen (NAPROSYN) 500 MG tablet Take 1 tablet by mouth every 12 (twelve) hours as needed for Pain as needed for pain. Take with food  for up to 10 days Disp: 20 tablet Rfl: 0   buPROPion (WELLBUTRIN SR) 200 MG 12 hr tablet Take 1 tablet by mouth 2 (two) times daily Disp: 60 tablet Rfl: 2   INCRUSE ELLIPTA 62.5 MCG/INH inhaler INHALE 1 PUFF BY MOUTH INTO LUNGS DAILY Disp: 1 Inhaler Rfl: 11   albuterol (PROVENTIL HFA,VENTOLIN HFA, PROAIR HFA) 108 (90 Base) MCG/ACT inhaler Inhale 2 puffs into the lungs every 6 (six) hours as needed for Wheezing or Shortness of breath Disp: 1 Inhaler Rfl: 2   ipratropium-albuterol (DUO-NEB) 0.5-2.5 (3) MG/3ML SOLN Inhalation Solution Take 3 mLs by nebulization 4 (four) times daily. Disp: 180 mL Rfl: 0     No current facility-administered medications on file prior to visit.     Review of Systems:  Neurological exam: negative  PHQ-9 TOTAL SCORE 03/03/2017 01/05/2017 11/27/2016   Doc FlowSheet Total Score - - -   Doc FlowSheet Total Score 4 22 18            PHYSICAL EXAMINATION:  General appearance - healthy female in no distress  Eyes - pupils 3 mm  Skin - warm and dry  Resp - lungs clear  Neuro - nonfocal  Affect - normal    PMP reviewed.  No unauthorized prescriptions since last refill.    Assessment:  Opioid Dependence  Comment: condition gradually improving on  current dose, actively participating in group.  Plan:  Continue buprenorphine, reviewed criteria for tapering when patient is ready.   Patient is counseled regarding relapse prevention, involvement in recovery groups, potential side effects of buprenorphine, and eventual taper of medication.    COPD   Stable on current medications    Smoking  Wearing patch  Down to a couple of cigarettes per day  Wants to try nicotrol inhaler  Prescription sent electronically to preferred pharmacy.      We discussed the patients current medications. The patient expressed understanding and no barriers to adherence were identified.  1. The patient indicates understanding of these issues and agrees with the plan.  Brief care plan is updated and reviewed with the patient.   2. The patient is given an After Visit Summary sheet that lists all medications with directions, allergies, orders placed during this encounter, and follow-up instructions.   3. I reviewed the patient's medical information and medical history   4. I reconciled the patient's medication list and prepared and supplied needed refills.   5. I have reviewed the past medical, family, and social history sections including the medications and allergies.    [name], MD

## 2018-03-09 NOTE — Telephone Encounter (Signed)
-----   Message from [facility] sent at 03/09/2018 11:43 AM EST -----  Regarding: Suboxone  Contact: [phone]  Jenefer Woerner [phone], 62 year old, female    Calls today:  Clinical Questions ([place])    Name of person calling [place]  Specific nature of request :Would like a phone call from [facility] regarding suboxide. Thank you   Return phone number [phone]  Person calling on behalf of patient: Patient (self)        Patient's language of care: English    Patient does not need an interpreter.    Patient's PCP: [name], MD

## 2018-03-09 NOTE — Addendum Note (Signed)
Addended by: Caidan Hubbert on: 03/09/2018 08:50 PM     Modules accepted: Orders

## 2018-03-10 DIAGNOSIS — M25671 Stiffness of right ankle, not elsewhere classified: Secondary | ICD-10-CM | POA: Diagnosis not present

## 2018-03-10 DIAGNOSIS — M25571 Pain in right ankle and joints of right foot: Secondary | ICD-10-CM | POA: Diagnosis not present

## 2018-03-10 DIAGNOSIS — M25471 Effusion, right ankle: Secondary | ICD-10-CM | POA: Diagnosis not present

## 2018-03-10 LAB — OXYCODONE SCREEN URINE
OXYCOD SCRN URINE: NEGATIVE
OXYCOD UR SPEC GRAV: 1.027 (ref 1.003–1.035)
OXYCOD URINE CREAT: 244 md/dL
OXYCOD URINE PH: 5.5 (ref 4.5–8.5)

## 2018-03-10 LAB — ETHANOL URINE: ETHANOL URINE: NEGATIVE

## 2018-03-10 LAB — FENTANYL URINE: FENTANYL URINE: NEGATIVE

## 2018-03-10 LAB — BUPRENORPHINE SCREEN URINE: BUPRENORPHINE SCREEN URINE: POSITIVE

## 2018-03-10 LAB — OPIATES URINE: OPIATES URINE: NEGATIVE

## 2018-03-10 LAB — METHADONE URINE: METHADONE URINE: NEGATIVE

## 2018-03-10 LAB — BENZODIAZEPINES URINE: BENZODIAZEPINES URINE: NEGATIVE

## 2018-03-10 LAB — COCAINE METABOLITES URINE: COCAINE METABOLITES URINE: NEGATIVE

## 2018-03-12 ENCOUNTER — Other Ambulatory Visit (HOSPITAL_BASED_OUTPATIENT_CLINIC_OR_DEPARTMENT_OTHER): Payer: Self-pay | Admitting: Internal Medicine

## 2018-03-12 NOTE — Progress Notes (Unsigned)
PER Pharmacy, Cheryl Dixon is a 62 year old female has requested a refill of naproxen.       Last Office Visit: 03/02/2018 with [name]  Last Physical Exam: 05/27/2013     COLONOSCOPY due on 08/22/2017     Other Med Adult:  Most Recent BP Reading(s)  01/21/18 : 140/96        Cholesterol (mg/dl)   Date Value   01/04/2010 264 (H)     LOW DENSITY LIPOPROTEIN DIRECT (mg/dl)   Date Value   01/04/2010 101 (H)     HIGH DENSITY LIPOPROTEIN (mg/dl)   Date Value   01/04/2010 38     No results found for: TG      THYROID SCREEN TSH REFLEX FT4 (uIU/mL)   Date Value   05/27/2013 2.130         No results found for: TSH    HEMOGLOBIN A1C (%)   Date Value   05/27/2013 5.9 (H)       No results found for: POCA1C      INR (no units)   Date Value   02/16/2007 1.0 (L)   07/03/2006 < 1.0 (L)       SODIUM (mmol/L)   Date Value   07/17/2014 140       POTASSIUM (mmol/L)   Date Value   07/17/2014 4.2           CREATININE (mg/dL)   Date Value   07/17/2014 1.0        Documented patient preferred pharmacies:    [facility], [place], [place]  Phone: [phone] Fax: [phone]

## 2018-03-15 DIAGNOSIS — M25671 Stiffness of right ankle, not elsewhere classified: Secondary | ICD-10-CM | POA: Diagnosis not present

## 2018-03-15 DIAGNOSIS — Z780 Asymptomatic menopausal state: Secondary | ICD-10-CM | POA: Diagnosis not present

## 2018-03-15 DIAGNOSIS — M25571 Pain in right ankle and joints of right foot: Secondary | ICD-10-CM | POA: Diagnosis not present

## 2018-03-15 DIAGNOSIS — D510 Vitamin B12 deficiency anemia due to intrinsic factor deficiency: Secondary | ICD-10-CM | POA: Diagnosis not present

## 2018-03-15 DIAGNOSIS — E039 Hypothyroidism, unspecified: Secondary | ICD-10-CM | POA: Diagnosis not present

## 2018-03-15 DIAGNOSIS — R5382 Chronic fatigue, unspecified: Secondary | ICD-10-CM | POA: Diagnosis not present

## 2018-03-15 DIAGNOSIS — M25471 Effusion, right ankle: Secondary | ICD-10-CM | POA: Diagnosis not present

## 2018-03-17 ENCOUNTER — Ambulatory Visit (INDEPENDENT_AMBULATORY_CARE_PROVIDER_SITE_OTHER): Payer: 59

## 2018-03-17 ENCOUNTER — Encounter (INDEPENDENT_AMBULATORY_CARE_PROVIDER_SITE_OTHER): Payer: Self-pay | Admitting: Orthopaedic Surgery

## 2018-03-17 ENCOUNTER — Ambulatory Visit (INDEPENDENT_AMBULATORY_CARE_PROVIDER_SITE_OTHER): Payer: 59 | Admitting: Orthopaedic Surgery

## 2018-03-17 DIAGNOSIS — M25671 Stiffness of right ankle, not elsewhere classified: Secondary | ICD-10-CM | POA: Diagnosis not present

## 2018-03-17 DIAGNOSIS — M2021 Hallux rigidus, right foot: Secondary | ICD-10-CM

## 2018-03-17 DIAGNOSIS — M25571 Pain in right ankle and joints of right foot: Secondary | ICD-10-CM | POA: Diagnosis not present

## 2018-03-17 DIAGNOSIS — M25471 Effusion, right ankle: Secondary | ICD-10-CM | POA: Diagnosis not present

## 2018-03-17 NOTE — Progress Notes (Signed)
Post-Op Visit Note   Patient: Michelle Roberts           Date of Birth: [birth year]           MRN: [id number] Visit Date: 03/17/2018 PCP: Antony Contras, MD   Assessment & Plan:  Chief Complaint:  Chief Complaint  Patient presents with  . Right Foot - Routine Post Op   Visit Diagnoses:  1. Hallux rigidus, right foot     Plan: Michelle Roberts is 6 weeks status post right great toe fusion.  She is doing well overall.  She complains of some swelling at the end of the day.  Overall her prior pain has resolved.  Her surgical scar is fully healed.  She has no gross motion at the MTP joint.  The toe is clinically straight.  X-rays demonstrate continued healing and consolidation of the fusion site.  There is no hollow complication.  At this point she can discontinue the postop shoe.  I given her a prescription for removable stiff sole orthotic to place in her regular shoes.  She does not require any pain medicines.  We will recheck her in 6 weeks with 2 view x-rays of the right great toe.  Follow-Up Instructions: Return in about 6 weeks (around 04/28/2018).   Orders:  Orders Placed This Encounter  Procedures  . XR Foot 2 Views Right   No orders of the defined types were placed in this encounter.   Imaging: No results found.  PMFS History: Patient Active Problem List   Diagnosis Date Noted  . Hallux rigidus, right foot 02/03/2018  . 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 Fibromyalgia 08/12/2016  . High risk medication use 08/12/2016  . Primary osteoarthritis of both hands 08/12/2016  . Acute midline low back pain 08/12/2016  . DDD (degenerative disc disease), lumbar 08/12/2016  . History of hypertension 08/12/2016  . History of high cholesterol 08/12/2016  . Thyroid ca ([place]) 08/12/2016  . History of hypothyroidism 08/12/2016  . History of gastroesophageal reflux (GERD) 08/12/2016  . History of TMJ syndrome 08/12/2016  . Burning tongue syndrome 08/12/2016  . History of vitamin D  deficiency 08/12/2016  . Other sleep apnea 08/12/2016  . Vitamin D deficiency 08/12/2016  . Rheumatoid arthritis with rheumatoid factor of multiple sites without organ or systems involvement ([place]) 12/31/2015  . Goals of care, counseling/discussion 06/02/2013  . Myofascial muscle pain 06/02/2013  . Narcotic-induced mood disorder ([place]) 06/02/2013  . Inflammatory arthritis 04/08/2011  . Plantar fasciitis 04/08/2011  . Yeast infection 04/08/2011   Past Medical History:  Diagnosis Date  . Anxiety   . Arthritis   . Depression   . Fibromyalgia   . GERD (gastroesophageal reflux disease)   . Hyperlipidemia   . Hypertension   . Hypothyroidism    thyroidectomy  . Neoplasm of parotid gland   . Neuromuscular disorder ([place])   . Rheumatoid aortitis   . Sleep apnea    uses CPAP sometimes    Family History  Problem Relation Age of Onset  . Alzheimer's disease Mother   . Alzheimer's disease Sister     Past Surgical History:  Procedure Laterality Date  . ABDOMINAL HYSTERECTOMY    . CHOLECYSTECTOMY    . HAMMER TOE SURGERY    . MASS EXCISION N/A 11/17/2017   Procedure: EXCISION SOFT PALATE LESION;  Surgeon: Rozetta Nunnery, MD;  Location: [place];  Service: ENT;  Laterality: N/A;  . PAROTIDECTOMY Left 04/08/2016   Procedure: LEFT  SUPERFICIAL PAROTIDECTOMY WITH FACIAL NERVE DISECTION;  Surgeon: Rozetta Nunnery, MD;  Location: [place];  Service: ENT;  Laterality: Left;  . TARSAL METATARSAL ARTHRODESIS Right 02/03/2018   Procedure: RIGHT GREAT TOE  FUSION;  Surgeon: Leandrew Koyanagi, MD;  Location: [facility];  Service: Orthopedics;  Laterality: Right;  . THYROIDECTOMY     Social History   Occupational History  . Not on file  Tobacco Use  . Smoking status: Never Smoker  . Smokeless tobacco: Never Used  Substance and Sexual Activity  . Alcohol use: No  . Drug use: No  . Sexual activity: Not on file

## 2018-03-22 ENCOUNTER — Other Ambulatory Visit (HOSPITAL_BASED_OUTPATIENT_CLINIC_OR_DEPARTMENT_OTHER): Payer: Self-pay | Admitting: Family Medicine

## 2018-03-22 DIAGNOSIS — F1120 Opioid dependence, uncomplicated: Secondary | ICD-10-CM

## 2018-03-22 DIAGNOSIS — M25671 Stiffness of right ankle, not elsewhere classified: Secondary | ICD-10-CM | POA: Diagnosis not present

## 2018-03-22 DIAGNOSIS — M25471 Effusion, right ankle: Secondary | ICD-10-CM | POA: Diagnosis not present

## 2018-03-22 DIAGNOSIS — M25571 Pain in right ankle and joints of right foot: Secondary | ICD-10-CM | POA: Diagnosis not present

## 2018-03-22 MED ORDER — BUPRENORPHINE HCL-NALOXONE HCL 8-2 MG SL FILM
2.0000 | ORAL_FILM | Freq: Every day | SUBLINGUAL | 0 refills | Status: DC
Start: 2018-03-22 — End: 2018-03-24

## 2018-03-22 MED ORDER — BUPRENORPHINE HCL-NALOXONE HCL 8-2 MG SL FILM: 2 | Film | Freq: Every day | SUBLINGUAL | 0 refills | 0 days | Status: AC

## 2018-03-23 ENCOUNTER — Telehealth (HOSPITAL_BASED_OUTPATIENT_CLINIC_OR_DEPARTMENT_OTHER): Payer: Self-pay

## 2018-03-23 ENCOUNTER — Ambulatory Visit (HOSPITAL_BASED_OUTPATIENT_CLINIC_OR_DEPARTMENT_OTHER): Payer: Commercial Managed Care - HMO | Admitting: Internal Medicine

## 2018-03-23 ENCOUNTER — Ambulatory Visit (HOSPITAL_BASED_OUTPATIENT_CLINIC_OR_DEPARTMENT_OTHER): Payer: Self-pay | Admitting: Family Medicine

## 2018-03-23 NOTE — Progress Notes (Signed)
Called home# lm to call office -patient missed 1130 group

## 2018-03-24 ENCOUNTER — Other Ambulatory Visit (HOSPITAL_BASED_OUTPATIENT_CLINIC_OR_DEPARTMENT_OTHER): Payer: Self-pay | Admitting: Internal Medicine

## 2018-03-24 ENCOUNTER — Ambulatory Visit: Payer: 59 | Attending: Internal Medicine

## 2018-03-24 DIAGNOSIS — F1120 Opioid dependence, uncomplicated: Secondary | ICD-10-CM | POA: Diagnosis present

## 2018-03-24 DIAGNOSIS — M25671 Stiffness of right ankle, not elsewhere classified: Secondary | ICD-10-CM | POA: Diagnosis not present

## 2018-03-24 DIAGNOSIS — M25571 Pain in right ankle and joints of right foot: Secondary | ICD-10-CM | POA: Diagnosis not present

## 2018-03-24 DIAGNOSIS — M25471 Effusion, right ankle: Secondary | ICD-10-CM | POA: Diagnosis not present

## 2018-03-24 LAB — BUPRENORPHINE SCREEN URINE: BUPRENORPHINE SCREEN URINE: POSITIVE

## 2018-03-24 LAB — COCAINE METABOLITES URINE: COCAINE METABOLITES URINE: NEGATIVE

## 2018-03-24 LAB — OPIATES URINE: OPIATES URINE: NEGATIVE

## 2018-03-24 LAB — OXYCODONE SCREEN URINE
OXYCOD SCRN URINE: NEGATIVE
OXYCOD UR SPEC GRAV: 1.005 (ref 1.003–1.035)
OXYCOD URINE CREAT: 34 md/dL
OXYCOD URINE PH: 5.5 (ref 4.5–8.5)

## 2018-03-24 LAB — ETHANOL URINE: ETHANOL URINE: NEGATIVE

## 2018-03-24 LAB — METHADONE URINE: METHADONE URINE: NEGATIVE

## 2018-03-24 LAB — BENZODIAZEPINES URINE: BENZODIAZEPINES URINE: NEGATIVE

## 2018-03-24 LAB — FENTANYL URINE: FENTANYL URINE: NEGATIVE

## 2018-03-24 MED ORDER — BUPRENORPHINE HCL-NALOXONE HCL 8-2 MG SL FILM
2.0000 | ORAL_FILM | Freq: Every day | SUBLINGUAL | 0 refills | Status: DC
Start: 2018-03-24 — End: 2018-04-05

## 2018-03-24 MED ORDER — BUPRENORPHINE HCL-NALOXONE HCL 8-2 MG SL FILM: 2 | Film | Freq: Every day | SUBLINGUAL | 0 refills | 0 days | Status: AC

## 2018-03-24 NOTE — Progress Notes (Signed)
Urine specimen collected and sent to lab  [facility], [place], 03/24/2018

## 2018-04-05 ENCOUNTER — Other Ambulatory Visit (HOSPITAL_BASED_OUTPATIENT_CLINIC_OR_DEPARTMENT_OTHER): Payer: Self-pay | Admitting: Family Medicine

## 2018-04-05 DIAGNOSIS — F1120 Opioid dependence, uncomplicated: Secondary | ICD-10-CM

## 2018-04-05 MED ORDER — BUPRENORPHINE HCL-NALOXONE HCL 8-2 MG SL FILM
2.0000 | ORAL_FILM | Freq: Every day | SUBLINGUAL | 0 refills | Status: DC
Start: 2018-04-05 — End: 2018-04-20

## 2018-04-05 MED ORDER — BUPRENORPHINE HCL-NALOXONE HCL 8-2 MG SL FILM: 2 | Film | Freq: Every day | SUBLINGUAL | 0 refills | 0 days | Status: AC

## 2018-04-06 ENCOUNTER — Ambulatory Visit: Payer: 59 | Admitting: Family Medicine

## 2018-04-06 ENCOUNTER — Ambulatory Visit (HOSPITAL_BASED_OUTPATIENT_CLINIC_OR_DEPARTMENT_OTHER): Payer: Commercial Managed Care - HMO | Admitting: Internal Medicine

## 2018-04-06 NOTE — Progress Notes (Deleted)
Cheryl Dixon is a 62 year old female seen in follow up for opioid dependence:    Buprenorphine dose: ***  Response, adequacy of dose: ***  Relapses/close calls: ***  Trigger: ***  Meetings: ***    Social history/events:  ***    Present Medications:    Current Outpatient Medications on File Prior to Visit:  buprenorphine-naloxone (SUBOXONE) 8-2 MG sublingual film Place 2 Film under the tongue daily Max Daily Amount: 2 Film  for 14 days Disp: 28 Film Rfl: 0   nicotine (NICOTROL) 10 MG inhaler Inhale 1 puff into the lungs every 2 (two) hours as needed for Craving (Nicotine) Use up to 16 cartridges per day, then taper as tolerated Disp: 168 each Rfl: 1   nicotine (NICODERM CQ) 21 MG/24HR Place 1 patch onto the skin daily Disp: 30 patch Rfl: 1   naproxen (NAPROSYN) 500 MG tablet Take 1 tablet by mouth every 12 (twelve) hours as needed for Pain as needed for pain. Take with food  for up to 10 days Disp: 20 tablet Rfl: 0   buPROPion (WELLBUTRIN SR) 200 MG 12 hr tablet Take 1 tablet by mouth 2 (two) times daily Disp: 60 tablet Rfl: 2   INCRUSE ELLIPTA 62.5 MCG/INH inhaler INHALE 1 PUFF BY MOUTH INTO LUNGS DAILY Disp: 1 Inhaler Rfl: 11   albuterol (PROVENTIL HFA,VENTOLIN HFA, PROAIR HFA) 108 (90 Base) MCG/ACT inhaler Inhale 2 puffs into the lungs every 6 (six) hours as needed for Wheezing or Shortness of breath Disp: 1 Inhaler Rfl: 2   ipratropium-albuterol (DUO-NEB) 0.5-2.5 (3) MG/3ML SOLN Inhalation Solution Take 3 mLs by nebulization 4 (four) times daily. Disp: 180 mL Rfl: 0     No current facility-administered medications on file prior to visit.     Review of Systems:  Neurological exam: {ROS - NEURO:900::"negative"}  PHQ-9 TOTAL SCORE 03/03/2017 01/05/2017 11/27/2016   Doc FlowSheet Total Score - - -   Doc FlowSheet Total Score 4 22 18            PHYSICAL EXAMINATION:  General appearance - healthy female in no distress  Eyes - pupils ***  Skin - warm and dry  Neuro - nonfocal  Affect -  normal    @PMPREV @    Assessment:  Opioid Dependence  Comment: ***  Plan:  ***  Patient is counseled regarding relapse prevention, involvement in recovery groups, potential side effects of buprenorphine, and eventual taper of medication.    @SIGDR @

## 2018-04-07 NOTE — Progress Notes (Signed)
Erroneous encounter

## 2018-04-08 ENCOUNTER — Ambulatory Visit: Payer: 59 | Attending: Internal Medicine

## 2018-04-08 DIAGNOSIS — F1120 Opioid dependence, uncomplicated: Secondary | ICD-10-CM | POA: Diagnosis present

## 2018-04-08 LAB — METHADONE URINE: METHADONE URINE: NEGATIVE

## 2018-04-08 LAB — OXYCODONE SCREEN URINE
OXYCOD SCRN URINE: NEGATIVE
OXYCOD UR SPEC GRAV: 1.01 (ref 1.003–1.035)
OXYCOD URINE CREAT: 183 md/dL
OXYCOD URINE PH: 5.5 (ref 4.5–8.5)

## 2018-04-08 LAB — OPIATES URINE: OPIATES URINE: NEGATIVE

## 2018-04-08 LAB — COCAINE METABOLITES URINE: COCAINE METABOLITES URINE: NEGATIVE

## 2018-04-08 LAB — BENZODIAZEPINES URINE: BENZODIAZEPINES URINE: NEGATIVE

## 2018-04-08 LAB — BUPRENORPHINE SCREEN URINE: BUPRENORPHINE SCREEN URINE: POSITIVE

## 2018-04-08 LAB — FENTANYL URINE: FENTANYL URINE: NEGATIVE

## 2018-04-08 LAB — ETHANOL URINE: ETHANOL URINE: NEGATIVE

## 2018-04-19 ENCOUNTER — Telehealth (HOSPITAL_BASED_OUTPATIENT_CLINIC_OR_DEPARTMENT_OTHER): Payer: Self-pay

## 2018-04-19 NOTE — Progress Notes (Signed)
.  Called home# spoke with patient confirmed will attend check-in 9-12.

## 2018-04-20 ENCOUNTER — Ambulatory Visit (HOSPITAL_BASED_OUTPATIENT_CLINIC_OR_DEPARTMENT_OTHER): Payer: Commercial Managed Care - HMO | Admitting: Internal Medicine

## 2018-04-20 ENCOUNTER — Other Ambulatory Visit (HOSPITAL_BASED_OUTPATIENT_CLINIC_OR_DEPARTMENT_OTHER): Payer: Self-pay | Admitting: Internal Medicine

## 2018-04-20 ENCOUNTER — Ambulatory Visit (HOSPITAL_BASED_OUTPATIENT_CLINIC_OR_DEPARTMENT_OTHER): Payer: Self-pay | Admitting: Registered Nurse

## 2018-04-20 ENCOUNTER — Ambulatory Visit: Payer: 59 | Attending: Internal Medicine

## 2018-04-20 DIAGNOSIS — F1120 Opioid dependence, uncomplicated: Secondary | ICD-10-CM | POA: Diagnosis present

## 2018-04-20 LAB — OPIATES URINE: OPIATES URINE: NEGATIVE

## 2018-04-20 LAB — OXYCODONE SCREEN URINE
OXYCOD SCRN URINE: NEGATIVE
OXYCOD UR SPEC GRAV: 1.025 (ref 1.003–1.035)
OXYCOD URINE CREAT: 148 md/dL
OXYCOD URINE PH: 5 (ref 4.5–8.5)

## 2018-04-20 LAB — BUPRENORPHINE SCREEN URINE: BUPRENORPHINE SCREEN URINE: POSITIVE

## 2018-04-20 LAB — FENTANYL URINE: FENTANYL URINE: NEGATIVE

## 2018-04-20 LAB — METHADONE URINE: METHADONE URINE: NEGATIVE

## 2018-04-20 LAB — COCAINE METABOLITES URINE: COCAINE METABOLITES URINE: NEGATIVE

## 2018-04-20 LAB — BENZODIAZEPINES URINE: BENZODIAZEPINES URINE: NEGATIVE

## 2018-04-20 LAB — ETHANOL URINE: ETHANOL URINE: NEGATIVE

## 2018-04-20 MED ORDER — BUPRENORPHINE HCL-NALOXONE HCL 8-2 MG SL FILM: 2 | Film | Freq: Every day | SUBLINGUAL | 0 refills | 0 days | Status: AC

## 2018-04-20 MED ORDER — BUPRENORPHINE HCL-NALOXONE HCL 8-2 MG SL FILM
2.0000 | ORAL_FILM | Freq: Every day | SUBLINGUAL | 0 refills | Status: DC
Start: 2018-04-20 — End: 2018-05-11

## 2018-04-20 NOTE — Progress Notes (Signed)
Urine specimen collected and sent to lab  [facility], [place], 04/20/2018

## 2018-04-27 ENCOUNTER — Ambulatory Visit (INDEPENDENT_AMBULATORY_CARE_PROVIDER_SITE_OTHER): Payer: 59 | Admitting: Orthopaedic Surgery

## 2018-05-04 ENCOUNTER — Ambulatory Visit (INDEPENDENT_AMBULATORY_CARE_PROVIDER_SITE_OTHER): Payer: 59 | Admitting: Orthopaedic Surgery

## 2018-05-05 ENCOUNTER — Other Ambulatory Visit (HOSPITAL_BASED_OUTPATIENT_CLINIC_OR_DEPARTMENT_OTHER): Payer: Self-pay | Admitting: Internal Medicine

## 2018-05-05 MED ORDER — NAPROXEN 500 MG PO TABS
500.0000 mg | ORAL_TABLET | Freq: Two times a day (BID) | ORAL | 0 refills | Status: DC | PRN
Start: 2018-05-05 — End: 2018-09-17

## 2018-05-05 MED ORDER — NAPROXEN 500 MG PO TABS: 500 mg | tablet | Freq: Two times a day (BID) | ORAL | 0 refills | 0 days | Status: AC | PRN

## 2018-05-05 MED ORDER — NICOTINE 21 MG/24HR TD PT24
1.0000 | MEDICATED_PATCH | Freq: Every day | TRANSDERMAL | 1 refills | Status: DC
Start: 2018-05-05 — End: 2018-07-21

## 2018-05-05 MED ORDER — NICOTINE 21 MG/24HR TD PT24: 1 | patch | Freq: Every day | TRANSDERMAL | 1 refills | 0 days | Status: AC

## 2018-05-05 NOTE — Progress Notes (Signed)
PER Patient (self), Cheryl Dixon is a 63 year old female has requested a refill of         -  NAPROXEN & NICOTINE PATACHES       Last Office Visit: 04/06/18 with GREENSTEIN, L.  Last Physical Exam: 05/27/13     COLONOSCOPY due on 08/22/2017     Other Med Adult:  Most Recent BP Reading(s)  01/21/18 : 140/96        Cholesterol (mg/dl)   Date Value   01/04/2010 264 (H)     LOW DENSITY LIPOPROTEIN DIRECT (mg/dl)   Date Value   01/04/2010 101 (H)     HIGH DENSITY LIPOPROTEIN (mg/dl)   Date Value   01/04/2010 38     No results found for: TG      THYROID SCREEN TSH REFLEX FT4 (uIU/mL)   Date Value   05/27/2013 2.130         No results found for: TSH    HEMOGLOBIN A1C (%)   Date Value   05/27/2013 5.9 (H)       No results found for: POCA1C      INR (no units)   Date Value   02/16/2007 1.0 (L)   07/03/2006 < 1.0 (L)       SODIUM (mmol/L)   Date Value   07/17/2014 140       POTASSIUM (mmol/L)   Date Value   07/17/2014 4.2           CREATININE (mg/dL)   Date Value   07/17/2014 1.0        Documented patient preferred pharmacies:    [facility], [place], [place]  Phone: [phone] Fax: [phone]

## 2018-05-10 DIAGNOSIS — Z1231 Encounter for screening mammogram for malignant neoplasm of breast: Secondary | ICD-10-CM | POA: Diagnosis not present

## 2018-05-11 ENCOUNTER — Ambulatory Visit: Payer: 59 | Attending: Family Medicine | Admitting: Family Medicine

## 2018-05-11 ENCOUNTER — Telehealth (HOSPITAL_BASED_OUTPATIENT_CLINIC_OR_DEPARTMENT_OTHER): Payer: Self-pay

## 2018-05-11 ENCOUNTER — Other Ambulatory Visit (HOSPITAL_BASED_OUTPATIENT_CLINIC_OR_DEPARTMENT_OTHER): Payer: Self-pay | Admitting: Family Medicine

## 2018-05-11 DIAGNOSIS — M79671 Pain in right foot: Secondary | ICD-10-CM | POA: Diagnosis not present

## 2018-05-11 DIAGNOSIS — M79672 Pain in left foot: Secondary | ICD-10-CM | POA: Diagnosis not present

## 2018-05-11 DIAGNOSIS — F1120 Opioid dependence, uncomplicated: Secondary | ICD-10-CM | POA: Diagnosis present

## 2018-05-11 DIAGNOSIS — M2061 Acquired deformities of toe(s), unspecified, right foot: Secondary | ICD-10-CM | POA: Insufficient documentation

## 2018-05-11 MED ORDER — BUPRENORPHINE HCL-NALOXONE HCL 8-2 MG SL FILM
2.0000 | ORAL_FILM | Freq: Every day | SUBLINGUAL | 0 refills | Status: DC
Start: 2018-05-11 — End: 2018-05-25

## 2018-05-11 MED ORDER — BUPRENORPHINE HCL-NALOXONE HCL 8-2 MG SL FILM: 2 | Film | Freq: Every day | SUBLINGUAL | 0 refills | 0 days | Status: AC

## 2018-05-11 NOTE — Progress Notes (Signed)
1) who is Raynelle Dick and why should we share protected health information with him?  Is he helping the patient somehow?  2) Once we confirm there is a legitimate reason or release for disclosure, you can let him know that she is diagnosed with major depression in remission, was most recently prescribed bupropion 200 mg bid, but it looks like she has run out of refills.  Due for reassessment, we can assess at her next appt.    Joellen Jersey - can you outreach to check on this?  She has not been coming to groups too reliably.  Can you do PHQ9 by phone?  Thanks

## 2018-05-11 NOTE — Progress Notes (Signed)
Returned call to [employer], they need to know if patient is still taking wellbutrin and if she is has she been evaluated or diagnosed with depression recently.        Per epic looks like med has expired.    Advised I would need to forward message to provider to determine this.

## 2018-05-11 NOTE — Telephone Encounter (Signed)
-----   Message from [facility] sent at 05/11/2018  3:33 PM EST -----  Regarding: Requesting Phone Call - [place]/DIAGNOSIS/MEDS  Contact: [phone]  Ellenor Wisniewski [phone], 63 year old, female    Calls today:  Clinical Questions ([place])    Name of person calling Erico, [place]  Specific nature of request Requesting phone call - Bartholomew Boards states that he needs a verbal confirmation that pt currently has a diagnosis and is being treated for a condition with medication buPROPion.  Return phone number [phone]    Patient's language of care: English    Patient does not need an interpreter.    Patient's PCP: [name], MD

## 2018-05-11 NOTE — Progress Notes (Signed)
Cheryl Dixon is a 63 year old female seen in follow up for opioid dependence:    Buprenorphine dose: 16 mg  Response, adequacy of dose: good  Relapses/close calls: none  Trigger: n/a  Meetings: no    Social history/events:  Having a lot of foot pain again - would like to see a podiatrist    Missed a lot of appointments because of stuff going on at home  Doing fine, feels like things are going better overall  Knows she needs to come more regularly  Also working, makes scheduling hard, but can make it    Present Medications:    Current Outpatient Medications on File Prior to Visit:  nicotine (NICODERM CQ) 21 MG/24HR Place 1 patch onto the skin daily Disp: 30 patch Rfl: 1   naproxen (NAPROSYN) 500 MG tablet Take 1 tablet by mouth every 12 (twelve) hours as needed for Pain as needed for pain. Take with food  for up to 10 days Disp: 20 tablet Rfl: 0   buPROPion (WELLBUTRIN SR) 200 MG 12 hr tablet Take 1 tablet by mouth 2 (two) times daily Disp: 60 tablet Rfl: 2   INCRUSE ELLIPTA 62.5 MCG/INH inhaler INHALE 1 PUFF BY MOUTH INTO LUNGS DAILY Disp: 1 Inhaler Rfl: 11   albuterol (PROVENTIL HFA,VENTOLIN HFA, PROAIR HFA) 108 (90 Base) MCG/ACT inhaler Inhale 2 puffs into the lungs every 6 (six) hours as needed for Wheezing or Shortness of breath Disp: 1 Inhaler Rfl: 2   ipratropium-albuterol (DUO-NEB) 0.5-2.5 (3) MG/3ML SOLN Inhalation Solution Take 3 mLs by nebulization 4 (four) times daily. Disp: 180 mL Rfl: 0     No current facility-administered medications on file prior to visit.     Review of Systems:  Neurological exam: negative  PHQ-9 TOTAL SCORE 03/03/2017 01/05/2017 11/27/2016   Doc FlowSheet Total Score - - -   Doc FlowSheet Total Score 4 22 18            PHYSICAL EXAMINATION:  General appearance - healthy female in no distress  Eyes - pupils 55mm  Skin - warm and dry  Neuro - nonfocal  Affect - normal    PMP reviewed. No unauthorized prescriptions since last refill.     Assessment:  Opioid Dependence  Comment:  condition gradually improving on current dose, actively participating in group.  Plan:  Agrees to make more regular groups, understands she needs to come regularly   Continue buprenorphine, reviewed criteria for tapering when patient is ready.  Patient is counseled regarding relapse prevention, involvement in recovery groups, potential side effects of buprenorphine, and eventual taper of medication.    Bilateral foot pain - refer back to podiatry    1. The patient indicates understanding of these issues and agrees with the plan.  2.  The patient is given an After Visit Summary sheet that lists all of their medications with directions, their allergies, orders placed during this encounter, immunization dates, and follow- up instructions.  3. I reviewed the patient's medical information and medical history   4.  I reconciled the patient's medication list and prepared and supplied needed refills.  5.  I have reviewed the past medical, family, and social history sections including the medications and allergies listed in the above medical record  [name], MD, 05/13/2018

## 2018-05-12 ENCOUNTER — Ambulatory Visit (INDEPENDENT_AMBULATORY_CARE_PROVIDER_SITE_OTHER): Payer: 59 | Admitting: Orthopaedic Surgery

## 2018-05-12 ENCOUNTER — Ambulatory Visit (INDEPENDENT_AMBULATORY_CARE_PROVIDER_SITE_OTHER): Payer: 59

## 2018-05-12 ENCOUNTER — Encounter (INDEPENDENT_AMBULATORY_CARE_PROVIDER_SITE_OTHER): Payer: Self-pay | Admitting: Orthopaedic Surgery

## 2018-05-12 DIAGNOSIS — M2021 Hallux rigidus, right foot: Secondary | ICD-10-CM

## 2018-05-12 LAB — OXYCODONE SCREEN URINE
OXYCOD SCRN URINE: NEGATIVE
OXYCOD UR SPEC GRAV: 1.025 (ref 1.003–1.035)
OXYCOD URINE CREAT: 271 md/dL
OXYCOD URINE PH: 5.5 (ref 4.5–8.5)

## 2018-05-12 LAB — COCAINE METABOLITES URINE: COCAINE METABOLITES URINE: NEGATIVE

## 2018-05-12 LAB — OPIATES URINE: OPIATES URINE: NEGATIVE

## 2018-05-12 LAB — BENZODIAZEPINES URINE: BENZODIAZEPINES URINE: POSITIVE — AB

## 2018-05-12 LAB — ETHANOL URINE: ETHANOL URINE: NEGATIVE

## 2018-05-12 LAB — BUPRENORPHINE SCREEN URINE: BUPRENORPHINE SCREEN URINE: POSITIVE

## 2018-05-12 NOTE — Progress Notes (Signed)
Called Erico back, he is assessment nurse with [facility], the reason he is calling is they have claim for wellbutrin but do not have updated diagnosis on file for it, the one they have is > 60 year old and she has been getting medication.    Advised of ICD 10 code on problem list.

## 2018-05-12 NOTE — Progress Notes (Signed)
Post-Op Visit Note   Patient: Michelle Roberts           Date of Birth: 10/18/1955           MRN: [id number] Visit Date: 05/12/2018 PCP: Antony Contras, MD   Assessment & Plan:  Chief Complaint:  Chief Complaint  Patient presents with  . Right Great Toe - Pain, Follow-up   Visit Diagnoses:  1. Hallux rigidus, right foot     Plan: Patient is a pleasant 63 year old female who presents to our clinic today nearly 12 weeks status post right great toe fusion, date of surgery 02/03/2018.  Her pain has improved but is still quite bothersome with ambulation.  She has recently obtained custom foot orthotics from [employer] and bought double wide pair of shoes from [employer].  The wide shoes appear to be comfortable for the great toe, but are too wide for the rest of her foot.  The great toe is still too uncomfortable to wear a regular shoe with orthotics.  She also notes that her great toe crosses on top of the second toe when she is not wearing her toe spacer.  For this reason she has been wearing a spacer and postop shoe.  At this point, we will obtain a smaller toe spacer for the patient.  We will also send her back to [employer] for new shoes.  She will then follow-up with [facility] to have her orthotics altered.  Follow-up with [place] in 6 weeks time for recheck.  Follow-Up Instructions: Return in about 6 weeks (around 06/23/2018).   Orders:  Orders Placed This Encounter  Procedures  . XR Toe Great Right   No orders of the defined types were placed in this encounter.   Imaging: Xr Toe Great Right  Result Date: 05/12/2018 Completely fused right great toe   PMFS History: Patient Active Problem List   Diagnosis Date Noted  . Hallux rigidus, right foot 02/03/2018  . 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 Fibromyalgia 08/12/2016  . High risk medication use 08/12/2016  . Primary osteoarthritis of both hands 08/12/2016  . Acute midline low back pain 08/12/2016  . DDD (degenerative disc  disease), lumbar 08/12/2016  . History of hypertension 08/12/2016  . History of high cholesterol 08/12/2016  . Thyroid ca ([place]) 08/12/2016  . History of hypothyroidism 08/12/2016  . History of gastroesophageal reflux (GERD) 08/12/2016  . History of TMJ syndrome 08/12/2016  . Burning tongue syndrome 08/12/2016  . History of vitamin D deficiency 08/12/2016  . Other sleep apnea 08/12/2016  . Vitamin D deficiency 08/12/2016  . Rheumatoid arthritis with rheumatoid factor of multiple sites without organ or systems involvement ([place]) 12/31/2015  . Goals of care, counseling/discussion 06/02/2013  . Myofascial muscle pain 06/02/2013  . Narcotic-induced mood disorder ([place]) 06/02/2013  . Inflammatory arthritis 04/08/2011  . Plantar fasciitis 04/08/2011  . Yeast infection 04/08/2011   Past Medical History:  Diagnosis Date  . Anxiety   . Arthritis   . Depression   . Fibromyalgia   . GERD (gastroesophageal reflux disease)   . Hyperlipidemia   . Hypertension   . Hypothyroidism    thyroidectomy  . Neoplasm of parotid gland   . Neuromuscular disorder ([place])   . Rheumatoid aortitis   . Sleep apnea    uses CPAP sometimes    Family History  Problem Relation Age of Onset  . Alzheimer's disease Mother   . Alzheimer's disease Sister     Past Surgical History:  Procedure Laterality Date  . ABDOMINAL HYSTERECTOMY    . CHOLECYSTECTOMY    . HAMMER TOE SURGERY    . MASS EXCISION N/A 11/17/2017   Procedure: EXCISION SOFT PALATE LESION;  Surgeon: Rozetta Nunnery, MD;  Location: [place];  Service: ENT;  Laterality: N/A;  . PAROTIDECTOMY Left 04/08/2016   Procedure: LEFT SUPERFICIAL PAROTIDECTOMY WITH FACIAL NERVE DISECTION;  Surgeon: Rozetta Nunnery, MD;  Location: [place];  Service: ENT;  Laterality: Left;  . TARSAL METATARSAL ARTHRODESIS Right 02/03/2018   Procedure: RIGHT GREAT TOE  FUSION;  Surgeon: Leandrew Koyanagi, MD;  Location: [place];  Service: Orthopedics;  Laterality: Right;  . THYROIDECTOMY     Social History   Occupational History  . Not on file  Tobacco Use  . Smoking status: Never Smoker  . Smokeless tobacco: Never Used  Substance and Sexual Activity  . Alcohol use: No  . Drug use: No  . Sexual activity: Not on file

## 2018-05-24 ENCOUNTER — Other Ambulatory Visit (HOSPITAL_BASED_OUTPATIENT_CLINIC_OR_DEPARTMENT_OTHER): Payer: Self-pay | Admitting: Podiatrist

## 2018-05-25 ENCOUNTER — Ambulatory Visit: Payer: 59 | Attending: Family Medicine | Admitting: Family Medicine

## 2018-05-25 ENCOUNTER — Other Ambulatory Visit (HOSPITAL_BASED_OUTPATIENT_CLINIC_OR_DEPARTMENT_OTHER): Payer: Self-pay | Admitting: Family Medicine

## 2018-05-25 DIAGNOSIS — F1120 Opioid dependence, uncomplicated: Secondary | ICD-10-CM | POA: Insufficient documentation

## 2018-05-25 DIAGNOSIS — G4733 Obstructive sleep apnea (adult) (pediatric): Secondary | ICD-10-CM | POA: Diagnosis not present

## 2018-05-25 DIAGNOSIS — Z9989 Dependence on other enabling machines and devices: Secondary | ICD-10-CM | POA: Diagnosis not present

## 2018-05-25 LAB — OXYCODONE SCREEN URINE
OXYCOD SCRN URINE: NEGATIVE
OXYCOD UR SPEC GRAV: 1.025 (ref 1.003–1.035)
OXYCOD URINE CREAT: 242 md/dL
OXYCOD URINE PH: 6 (ref 4.5–8.5)

## 2018-05-25 LAB — BENZODIAZEPINES URINE: BENZODIAZEPINES URINE: NEGATIVE

## 2018-05-25 LAB — ETHANOL URINE: ETHANOL URINE: NEGATIVE

## 2018-05-25 LAB — BUPRENORPHINE SCREEN URINE: BUPRENORPHINE SCREEN URINE: POSITIVE

## 2018-05-25 LAB — FENTANYL URINE: FENTANYL URINE: NEGATIVE

## 2018-05-25 LAB — METHADONE URINE: METHADONE URINE: NEGATIVE

## 2018-05-25 LAB — COCAINE METABOLITES URINE: COCAINE METABOLITES URINE: NEGATIVE

## 2018-05-25 LAB — OPIATES URINE: OPIATES URINE: NEGATIVE

## 2018-05-25 MED ORDER — BUPRENORPHINE HCL-NALOXONE HCL 8-2 MG SL FILM: 2 | Film | Freq: Every day | SUBLINGUAL | 0 refills | 0 days | Status: AC

## 2018-05-25 MED ORDER — BUPRENORPHINE HCL-NALOXONE HCL 8-2 MG SL FILM
2.0000 | ORAL_FILM | Freq: Every day | SUBLINGUAL | 0 refills | Status: DC
Start: 2018-05-25 — End: 2018-06-08

## 2018-05-26 DIAGNOSIS — G4733 Obstructive sleep apnea (adult) (pediatric): Secondary | ICD-10-CM | POA: Diagnosis not present

## 2018-05-26 NOTE — Progress Notes (Signed)
Cheryl Dixon is a 63 year old female seen in follow up for opioid dependence:    Buprenorphine dose:16 mg  Response, adequacy of dose:good  Relapses/close calls:none  Trigger:n/a  Meetings:no    Social history/events:  Doing well  Still working, going fine  Spirits pretty good    Present Medications:    Current Outpatient Medications on File Prior to Visit:  nicotine (NICODERM CQ) 21 MG/24HR Place 1 patch onto the skin daily Disp: 30 patch Rfl: 1   naproxen (NAPROSYN) 500 MG tablet Take 1 tablet by mouth every 12 (twelve) hours as needed for Pain as needed for pain. Take with food  for up to 10 days Disp: 20 tablet Rfl: 0   buPROPion (WELLBUTRIN SR) 200 MG 12 hr tablet Take 1 tablet by mouth 2 (two) times daily Disp: 60 tablet Rfl: 2   INCRUSE ELLIPTA 62.5 MCG/INH inhaler INHALE 1 PUFF BY MOUTH INTO LUNGS DAILY Disp: 1 Inhaler Rfl: 11   albuterol (PROVENTIL HFA,VENTOLIN HFA, PROAIR HFA) 108 (90 Base) MCG/ACT inhaler Inhale 2 puffs into the lungs every 6 (six) hours as needed for Wheezing or Shortness of breath Disp: 1 Inhaler Rfl: 2   ipratropium-albuterol (DUO-NEB) 0.5-2.5 (3) MG/3ML SOLN Inhalation Solution Take 3 mLs by nebulization 4 (four) times daily. Disp: 180 mL Rfl: 0     No current facility-administered medications on file prior to visit.     Review of Systems:  Neurological exam: negative  PHQ-9 TOTAL SCORE 03/03/2017 01/05/2017 11/27/2016   Doc FlowSheet Total Score - - -   Doc FlowSheet Total Score 4 22 18            PHYSICAL EXAMINATION:  General appearance - healthy female in no distress  Eyes - pupils 41mm  Skin - warm and dry  Neuro - nonfocal  Affect - normal    PMP reviewed. No unauthorized prescriptions since last refill.     Assessment:  Opioid Dependence  Comment: condition gradually improving on current dose, actively participating in group.  Plan:  Continue buprenorphine, reviewed criteria for tapering when patient is ready.    Patient is counseled regarding relapse prevention,  involvement in recovery groups, potential side effects of buprenorphine, and eventual taper of medication.    1. The patient indicates understanding of these issues and agrees with the plan.  2.  The patient is given an After Visit Summary sheet that lists all of their medications with directions, their allergies, orders placed during this encounter, immunization dates, and follow- up instructions.  3. I reviewed the patient's medical information and medical history   4.  I reconciled the patient's medication list and prepared and supplied needed refills.  5.  I have reviewed the past medical, family, and social history sections including the medications and allergies listed in the above medical record  Cheryl Dawn, MD, 05/26/2018

## 2018-05-27 ENCOUNTER — Encounter (HOSPITAL_BASED_OUTPATIENT_CLINIC_OR_DEPARTMENT_OTHER): Payer: Self-pay

## 2018-05-28 ENCOUNTER — Ambulatory Visit
Admission: RE | Admit: 2018-05-28 | Discharge: 2018-05-28 | Disposition: A | Discharge status: Home / Self Care | Payer: 59 | Attending: Podiatrist | Admitting: Podiatrist

## 2018-05-28 ENCOUNTER — Ambulatory Visit (HOSPITAL_BASED_OUTPATIENT_CLINIC_OR_DEPARTMENT_OTHER): Payer: 59 | Admitting: Podiatrist

## 2018-05-28 ENCOUNTER — Encounter (HOSPITAL_BASED_OUTPATIENT_CLINIC_OR_DEPARTMENT_OTHER): Payer: Self-pay | Admitting: Podiatrist

## 2018-05-28 DIAGNOSIS — M2041 Other hammer toe(s) (acquired), right foot: Secondary | ICD-10-CM

## 2018-05-28 DIAGNOSIS — S93124S Dislocation of metatarsophalangeal joint of right lesser toe(s), sequela: Secondary | ICD-10-CM | POA: Diagnosis not present

## 2018-05-28 DIAGNOSIS — X58XXXS Exposure to other specified factors, sequela: Secondary | ICD-10-CM | POA: Diagnosis not present

## 2018-05-28 DIAGNOSIS — M2142 Flat foot [pes planus] (acquired), left foot: Secondary | ICD-10-CM | POA: Diagnosis not present

## 2018-05-28 DIAGNOSIS — M2061 Acquired deformities of toe(s), unspecified, right foot: Secondary | ICD-10-CM | POA: Insufficient documentation

## 2018-05-28 DIAGNOSIS — M2141 Flat foot [pes planus] (acquired), right foot: Secondary | ICD-10-CM | POA: Diagnosis not present

## 2018-05-28 DIAGNOSIS — M19071 Primary osteoarthritis, right ankle and foot: Secondary | ICD-10-CM | POA: Diagnosis not present

## 2018-05-28 DIAGNOSIS — S93124A Dislocation of metatarsophalangeal joint of right lesser toe(s), initial encounter: Secondary | ICD-10-CM

## 2018-05-28 DIAGNOSIS — M21961 Unspecified acquired deformity of right lower leg: Secondary | ICD-10-CM | POA: Diagnosis not present

## 2018-05-28 DIAGNOSIS — M7731 Calcaneal spur, right foot: Secondary | ICD-10-CM
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
## 2018-05-31 ENCOUNTER — Telehealth (HOSPITAL_BASED_OUTPATIENT_CLINIC_OR_DEPARTMENT_OTHER): Payer: Self-pay

## 2018-05-31 NOTE — Progress Notes (Signed)
This team member called Patient (self) to improve patient engagement for Depression, for substance use, for tobacco use and chronic pain. No interpreter needed.     Patient (self) did not answer, this team member left a message asking for a call back, gave direct # for MHCP.    Will try to do warm hand off at upcoming OBAT appointment on 2/11.    Evonnie Dawes, Mental Health Care Partner, 05/31/2018,12:48 PM    [name], MD 2 weeks ago         1) who is Raynelle Dick and why should we share protected health information with him?  Is he helping the patient somehow?  2) Once we confirm there is a legitimate reason or release for disclosure, you can let him know that she is diagnosed with major depression in remission, was most recently prescribed bupropion 200 mg bid, but it looks like she has run out of refills.  Due for reassessment, we can assess at her next appt.    Joellen Jersey - can you outreach to check on this?  She has not been coming to groups too reliably.  Can you do PHQ9 by phone?  Thanks

## 2018-06-01 ENCOUNTER — Telehealth (HOSPITAL_BASED_OUTPATIENT_CLINIC_OR_DEPARTMENT_OTHER): Payer: Self-pay

## 2018-06-01 NOTE — Progress Notes (Signed)
Incoming call from patient, AWQ administered. PHQ-9= 6, denies SI. DAST=4. 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 Enjoys OBAT, reports she used to take unprescribed opioid pills. Current marijuana and occasional unprescribed klonopin use "the doctor's know about it". Discussed bupropion- takes daily in morning and night, sometimes misses dose, needs refill, finds helpful. Sometimes has trouble getting to appointments, now using [employer] transportation. Is working with [employer] Case Manager Faylene Million, finds somewhat helpful, she usually calls pt every few months, does home visits if needed. Has surgery coming up on toe, not anxious about it. Doesn't feel like she needs more support right now, MHCP encourages her to reach out if she needs in future.    Evonnie Dawes, Mental Health Care Partner, 06/01/2018,5:02 PM

## 2018-06-03 MED ORDER — BUPROPION HCL ER (SR) 200 MG PO TB12: 200 mg | tablet | Freq: Two times a day (BID) | ORAL | 1 refills | 0 days | Status: AC

## 2018-06-03 MED ORDER — BUPROPION HCL ER (SR) 200 MG PO TB12
200.0000 mg | ORAL_TABLET | Freq: Two times a day (BID) | ORAL | 1 refills | Status: DC
Start: 2018-06-03 — End: 2019-07-20

## 2018-06-03 NOTE — Addendum Note (Signed)
Addended by: Janese Banks on: 06/03/2018 11:27 AM     Modules accepted: Orders

## 2018-06-03 NOTE — Progress Notes (Signed)
Prescription sent electronically to preferred pharmacy.  Patient has likely been taking buproprion inconsistently as she should have needed more refills.  Prescription sent electronically to preferred pharmacy.

## 2018-06-03 NOTE — Addendum Note (Signed)
Addended by: Caryn Gienger on: 06/03/2018 12:29 PM     Modules accepted: Orders

## 2018-06-03 NOTE — Progress Notes (Signed)
PER Patient (self), Cheryl Dixon is a 63 year old female has requested a refill of bupropion.      Last Office Visit: 05/25/2018 with greenstein  Last Physical Exam: 05/27/13    COLONOSCOPY due on 08/22/2017    Other Med Adult:  Most Recent BP Reading(s)  01/21/18 : 140/96        Cholesterol (mg/dl)   Date Value   01/04/2010 264 (H)     LOW DENSITY LIPOPROTEIN DIRECT (mg/dl)   Date Value   01/04/2010 101 (H)     HIGH DENSITY LIPOPROTEIN (mg/dl)   Date Value   01/04/2010 38     No results found for: TG      THYROID SCREEN TSH REFLEX FT4 (uIU/mL)   Date Value   05/27/2013 2.130         No results found for: TSH    HEMOGLOBIN A1C (%)   Date Value   05/27/2013 5.9 (H)       No results found for: POCA1C      INR (no units)   Date Value   02/16/2007 1.0 (L)   07/03/2006 < 1.0 (L)       SODIUM (mmol/L)   Date Value   07/17/2014 140       POTASSIUM (mmol/L)   Date Value   07/17/2014 4.2           CREATININE (mg/dL)   Date Value   07/17/2014 1.0       Documented patient preferred pharmacies:    [place], [place], [place]  Phone: [phone] Fax: [phone]

## 2018-06-07 DIAGNOSIS — N952 Postmenopausal atrophic vaginitis: Secondary | ICD-10-CM | POA: Diagnosis not present

## 2018-06-07 DIAGNOSIS — Z85850 Personal history of malignant neoplasm of thyroid: Secondary | ICD-10-CM | POA: Diagnosis not present

## 2018-06-07 DIAGNOSIS — B373 Candidiasis of vulva and vagina: Secondary | ICD-10-CM | POA: Diagnosis not present

## 2018-06-07 DIAGNOSIS — Z01411 Encounter for gynecological examination (general) (routine) with abnormal findings: Secondary | ICD-10-CM | POA: Diagnosis not present

## 2018-06-07 DIAGNOSIS — Z8589 Personal history of malignant neoplasm of other organs and systems: Secondary | ICD-10-CM | POA: Diagnosis not present

## 2018-06-07 DIAGNOSIS — Z800 Family history of malignant neoplasm of digestive organs: Secondary | ICD-10-CM | POA: Diagnosis not present

## 2018-06-08 ENCOUNTER — Other Ambulatory Visit (HOSPITAL_BASED_OUTPATIENT_CLINIC_OR_DEPARTMENT_OTHER): Payer: Self-pay | Admitting: Family Medicine

## 2018-06-08 ENCOUNTER — Ambulatory Visit: Payer: 59 | Attending: Family Medicine | Admitting: Family Medicine

## 2018-06-08 DIAGNOSIS — F1120 Opioid dependence, uncomplicated: Secondary | ICD-10-CM

## 2018-06-08 LAB — METHADONE URINE: METHADONE URINE: NEGATIVE

## 2018-06-08 LAB — OXYCODONE SCREEN URINE
OXYCOD SCRN URINE: NEGATIVE
OXYCOD UR SPEC GRAV: 1.02 (ref 1.003–1.035)
OXYCOD URINE CREAT: 116 md/dL
OXYCOD URINE PH: 5.5 (ref 4.5–8.5)

## 2018-06-08 LAB — ETHANOL URINE: ETHANOL URINE: NEGATIVE

## 2018-06-08 LAB — BENZODIAZEPINES URINE: BENZODIAZEPINES URINE: NEGATIVE

## 2018-06-08 LAB — AMPHETAMINES URINE: AMPHETAMINES URINE: NEGATIVE

## 2018-06-08 LAB — CANNABINOIDS URINE: CANNABINOIDS URINE: POSITIVE — AB

## 2018-06-08 LAB — OPIATES URINE: OPIATES URINE: NEGATIVE

## 2018-06-08 LAB — FENTANYL URINE: FENTANYL URINE: NEGATIVE

## 2018-06-08 LAB — BUPRENORPHINE SCREEN URINE: BUPRENORPHINE SCREEN URINE: POSITIVE

## 2018-06-08 LAB — COCAINE METABOLITES URINE: COCAINE METABOLITES URINE: NEGATIVE

## 2018-06-08 MED ORDER — BUPRENORPHINE HCL-NALOXONE HCL 8-2 MG SL FILM
2.0000 | ORAL_FILM | Freq: Every day | SUBLINGUAL | 0 refills | Status: DC
Start: 2018-06-08 — End: 2018-06-08

## 2018-06-08 MED ORDER — BUPRENORPHINE HCL-NALOXONE HCL 8-2 MG SL FILM: 2 | Film | Freq: Every day | SUBLINGUAL | 0 refills | 0 days | Status: AC

## 2018-06-08 MED ORDER — BUPRENORPHINE HCL-NALOXONE HCL 8-2 MG SL FILM: 2 | Film | Freq: Every day | SUBLINGUAL | 0 refills | 0 days | Status: DC

## 2018-06-08 MED ORDER — BUPRENORPHINE HCL-NALOXONE HCL 8-2 MG SL FILM
2.0000 | ORAL_FILM | Freq: Every day | SUBLINGUAL | 0 refills | Status: DC
Start: 2018-06-08 — End: 2018-06-22

## 2018-06-09 NOTE — Progress Notes (Signed)
Cheryl Dixon is a 63 year old female seen in follow up for opioid dependence:    Buprenorphine dose:16 mg  Response, adequacy of dose:good  Relapses/close calls:none  Trigger:n/a  Meetings:no    Social history/events:  Excited because going to be a great-grandmother  Trying to quit smoking - finding it hard  Otherwise feeling good    Present Medications:    Current Outpatient Medications on File Prior to Visit:  buPROPion (WELLBUTRIN SR) 200 MG 12 hr tablet Take 1 tablet by mouth 2 (two) times daily Disp: 180 tablet Rfl: 1   nicotine (NICODERM CQ) 21 MG/24HR Place 1 patch onto the skin daily Disp: 30 patch Rfl: 1   naproxen (NAPROSYN) 500 MG tablet Take 1 tablet by mouth every 12 (twelve) hours as needed for Pain as needed for pain. Take with food  for up to 10 days Disp: 20 tablet Rfl: 0   albuterol (PROVENTIL HFA,VENTOLIN HFA, PROAIR HFA) 108 (90 Base) MCG/ACT inhaler Inhale 2 puffs into the lungs every 6 (six) hours as needed for Wheezing or Shortness of breath Disp: 1 Inhaler Rfl: 2   ipratropium-albuterol (DUO-NEB) 0.5-2.5 (3) MG/3ML SOLN Inhalation Solution Take 3 mLs by nebulization 4 (four) times daily. Disp: 180 mL Rfl: 0     No current facility-administered medications on file prior to visit.     Review of Systems:  Neurological exam: negative  PHQ-9 TOTAL SCORE 06/01/2018 03/03/2017 01/05/2017   Doc FlowSheet Total Score - - -   Doc FlowSheet Total Score 6 4 22            PHYSICAL EXAMINATION:  General appearance - healthy female in no distress  Eyes - pupils 3mm  Skin - warm and dry  Neuro - nonfocal  Affect - normal    PMP reviewed. No unauthorized prescriptions since last refill.     Assessment:  Opioid Dependence  Comment: condition gradually improving on current dose, actively participating in group.  Plan:  Continue buprenorphine, reviewed criteria for tapering when patient is ready.    Patient is counseled regarding relapse prevention, involvement in recovery groups, potential side  effects of buprenorphine, and eventual taper of medication.    1. The patient indicates understanding of these issues and agrees with the plan.  2.  The patient is given an After Visit Summary sheet that lists all of their medications with directions, their allergies, orders placed during this encounter, immunization dates, and follow- up instructions.  3. I reviewed the patient's medical information and medical history   4.  I reconciled the patient's medication list and prepared and supplied needed refills.  5.  I have reviewed the past medical, family, and social history sections including the medications and allergies listed in the above medical record  [name], MD, 06/09/2018

## 2018-06-14 ENCOUNTER — Encounter (HOSPITAL_BASED_OUTPATIENT_CLINIC_OR_DEPARTMENT_OTHER): Payer: Self-pay

## 2018-06-14 ENCOUNTER — Emergency Department
Admission: EM | Admit: 2018-06-14 | Discharge: 2018-06-14 | Disposition: A | Discharge status: Home / Self Care | Payer: 59 | Source: Intra-hospital | Attending: Emergency Medicine | Admitting: Emergency Medicine

## 2018-06-14 ENCOUNTER — Encounter (HOSPITAL_BASED_OUTPATIENT_CLINIC_OR_DEPARTMENT_OTHER): Payer: Self-pay | Admitting: Student in an Organized Health Care Education/Training Program

## 2018-06-14 DIAGNOSIS — F419 Anxiety disorder, unspecified: Secondary | ICD-10-CM | POA: Diagnosis present

## 2018-06-14 DIAGNOSIS — R0602 Shortness of breath: Secondary | ICD-10-CM | POA: Diagnosis not present

## 2018-06-14 DIAGNOSIS — R002 Palpitations: Secondary | ICD-10-CM | POA: Diagnosis not present

## 2018-06-14 DIAGNOSIS — F17210 Nicotine dependence, cigarettes, uncomplicated: Secondary | ICD-10-CM | POA: Diagnosis not present

## 2018-06-14 LAB — CBC, PLATELET & DIFFERENTIAL
ABSOLUTE BASO COUNT: 0.1 10*3/uL (ref 0.0–0.1)
ABSOLUTE EOSINOPHIL COUNT: 0.3 10*3/uL (ref 0.0–0.8)
ABSOLUTE IMM GRAN COUNT: 0.04 10*3/uL — ABNORMAL HIGH (ref 0.00–0.03)
ABSOLUTE LYMPH COUNT: 2.7 10*3/uL (ref 0.6–5.9)
ABSOLUTE MONO COUNT: 0.6 10*3/uL (ref 0.2–1.4)
ABSOLUTE NEUTROPHIL COUNT: 3.6 10*3/uL (ref 1.6–8.3)
ABSOLUTE NRBC COUNT: 0 10*3/uL (ref 0.0–0.0)
BASOPHIL %: 1 % (ref 0.0–1.2)
EOSINOPHIL %: 4.2 % (ref 0.0–7.0)
HEMATOCRIT: 43.2 % (ref 34.1–44.9)
HEMOGLOBIN: 13.8 g/dL (ref 11.2–15.7)
IMMATURE GRANULOCYTE %: 0.5 % — ABNORMAL HIGH (ref 0.0–0.4)
LYMPHOCYTE %: 37.3 % (ref 15.0–54.0)
MEAN CORP HGB CONC: 31.9 g/dL (ref 31.0–37.0)
MEAN CORPUSCULAR HGB: 28.4 pg (ref 26.0–34.0)
MEAN CORPUSCULAR VOL: 88.9 fl (ref 80.0–100.0)
MEAN PLATELET VOLUME: 9 fL (ref 8.7–12.5)
MONOCYTE %: 8 % (ref 4.0–13.0)
NEUTROPHIL %: 49 % (ref 40.0–75.0)
NRBC %: 0 % (ref 0.0–0.0)
PLATELET COUNT: 260 10*3/uL (ref 150–400)
RBC DISTRIBUTION WIDTH STD DEV: 42.7 fL (ref 35.1–46.3)
RED BLOOD CELL COUNT: 4.86 M/uL (ref 3.90–5.20)
WHITE BLOOD CELL COUNT: 7.3 10*3/uL (ref 4.0–11.0)

## 2018-06-14 LAB — BASIC METABOLIC PANEL
ANION GAP: 9 mmol/L (ref 5–15)
BUN (UREA NITROGEN): 14 mg/dL (ref 7–18)
CALCIUM: 8.7 mg/dL (ref 8.5–10.1)
CARBON DIOXIDE: 30 mmol/L (ref 21–32)
CHLORIDE: 104 mmol/L (ref 98–107)
CREATININE: 0.9 mg/dL (ref 0.4–1.2)
ESTIMATED GLOMERULAR FILT RATE: >60 mL/min (ref >60)
Glucose Random: 100 mg/dL (ref 74–160)
POTASSIUM: 4.5 mmol/L (ref 3.5–5.1)
SODIUM: 143 mmol/L (ref 136–145)

## 2018-06-14 LAB — TROPONIN I: TROPONIN I: <0.02 ng/mL (ref 0.00–0.04)

## 2018-06-14 LAB — MAGNESIUM: MAGNESIUM: 2 mg/dL (ref 1.8–2.4)

## 2018-06-14 LAB — CARBOXYHEMOGLOBIN: CARBOXYHEMOGLOBIN: 4 % (ref 0.0–4.0)

## 2018-06-14 NOTE — Discharge Instructions (Addendum)
Examination today was reassuring.  Your EKG and blood tests were normal.  Your carbon monoxide level was not elevated.  Please follow-up with your primary care doctor.    Information about your visit today:    Not all problems can be diagnosed on the first visit.  Please follow up with your regular doctor in a few days.     If your symptoms become severe, please seek re-evaluation from either your doctor or your local emergency department.     This may include, but is not limited to:  Chest pain  Shortness of breath  Dizziness  Nausea  Vomiting  Abdominal Pain  Or any other new or concerning symptom

## 2018-06-14 NOTE — Narrator Note (Signed)
Patient Disposition    Patient education for diagnosis, medications, activity, diet and follow-up.  Patient left ED 5:57 PM.  Patient rep received written instructions.  Interpreter to provide instructions: No    Patient belongings with patient: YES    Have all existing LDAs been addressed? Yes    Have all IV infusions been stopped? N/A    Discharged to: Discharged to home
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
## 2018-06-14 NOTE — ED Notes (Signed)
Bed: 02-A  Expected date:   Expected time:   Means of arrival:   Comments:  Chest pain

## 2018-06-14 NOTE — ED Triage Note (Signed)
From home with 1 day of left sided palpitations after being concerned of Carbon Monoxide poisoning cleared by FD last night. Associated with nausea and SOB which has been ongoing for over 2 weeks.

## 2018-06-16 ENCOUNTER — Other Ambulatory Visit (HOSPITAL_BASED_OUTPATIENT_CLINIC_OR_DEPARTMENT_OTHER): Payer: Self-pay

## 2018-06-17 DIAGNOSIS — J4521 Mild intermittent asthma with (acute) exacerbation: Secondary | ICD-10-CM | POA: Diagnosis not present

## 2018-06-17 DIAGNOSIS — J301 Allergic rhinitis due to pollen: Secondary | ICD-10-CM | POA: Diagnosis not present

## 2018-06-18 ENCOUNTER — Other Ambulatory Visit (HOSPITAL_BASED_OUTPATIENT_CLINIC_OR_DEPARTMENT_OTHER): Payer: Self-pay | Admitting: Internal Medicine

## 2018-06-18 NOTE — Progress Notes (Unsigned)
PER Pharmacy, Cheryl Dixon is a 63 year old female has requested a refill of healthylax packets .      Last Office Visit:  06/08/18 with Quenton Fetter  Last Physical Exam: 05/27/13    COLONOSCOPY due on 08/22/2017    Other Med Adult:  Most Recent BP Reading(s)  06/14/18 : 160/84        Cholesterol (mg/dl)   Date Value   01/04/2010 264 (H)     LOW DENSITY LIPOPROTEIN DIRECT (mg/dl)   Date Value   01/04/2010 101 (H)     HIGH DENSITY LIPOPROTEIN (mg/dl)   Date Value   01/04/2010 38     No results found for: TG      THYROID SCREEN TSH REFLEX FT4 (uIU/mL)   Date Value   05/27/2013 2.130         No results found for: TSH    HEMOGLOBIN A1C (%)   Date Value   05/27/2013 5.9 (H)       No results found for: POCA1C      INR (no units)   Date Value   02/16/2007 1.0 (L)   07/03/2006 < 1.0 (L)       SODIUM (mmol/L)   Date Value   06/14/2018 143       POTASSIUM (mmol/L)   Date Value   06/14/2018 4.5           CREATININE (mg/dL)   Date Value   06/14/2018 0.9       Documented patient preferred pharmacies:    [place], [place], [place]  Phone: [phone] Fax: [phone]

## 2018-06-19 LAB — EKG
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
## 2018-06-21 ENCOUNTER — Encounter (HOSPITAL_BASED_OUTPATIENT_CLINIC_OR_DEPARTMENT_OTHER): Payer: Self-pay | Admitting: Physician Assistant

## 2018-06-22 ENCOUNTER — Ambulatory Visit (HOSPITAL_BASED_OUTPATIENT_CLINIC_OR_DEPARTMENT_OTHER): Payer: Self-pay | Admitting: Family Medicine

## 2018-06-22 ENCOUNTER — Other Ambulatory Visit (HOSPITAL_BASED_OUTPATIENT_CLINIC_OR_DEPARTMENT_OTHER): Payer: Self-pay | Admitting: Family Medicine

## 2018-06-22 DIAGNOSIS — F1120 Opioid dependence, uncomplicated: Secondary | ICD-10-CM

## 2018-06-22 MED ORDER — BUPRENORPHINE HCL-NALOXONE HCL 8-2 MG SL FILM
2.0000 | ORAL_FILM | Freq: Every day | SUBLINGUAL | 0 refills | Status: DC
Start: 2018-06-22 — End: 2018-07-06

## 2018-06-22 MED ORDER — BUPRENORPHINE HCL-NALOXONE HCL 8-2 MG SL FILM: 2 | Film | Freq: Every day | SUBLINGUAL | 0 refills | 0 days | Status: AC

## 2018-06-23 ENCOUNTER — Ambulatory Visit: Payer: 59 | Attending: Internal Medicine

## 2018-06-23 DIAGNOSIS — F1120 Opioid dependence, uncomplicated: Secondary | ICD-10-CM | POA: Insufficient documentation

## 2018-06-23 LAB — BENZODIAZEPINES URINE: BENZODIAZEPINES URINE: NEGATIVE

## 2018-06-23 LAB — COCAINE METABOLITES URINE: COCAINE METABOLITES URINE: NEGATIVE

## 2018-06-23 LAB — CANNABINOIDS URINE: CANNABINOIDS URINE: POSITIVE — AB

## 2018-06-23 LAB — OXYCODONE SCREEN URINE
OXYCOD SCRN URINE: NEGATIVE
OXYCOD UR SPEC GRAV: 1.02 (ref 1.003–1.035)
OXYCOD URINE CREAT: 111 md/dL
OXYCOD URINE PH: 5.5 (ref 4.5–8.5)

## 2018-06-23 LAB — METHADONE URINE: METHADONE URINE: NEGATIVE

## 2018-06-23 LAB — AMPHETAMINES URINE: AMPHETAMINES URINE: NEGATIVE

## 2018-06-23 LAB — OPIATES URINE: OPIATES URINE: NEGATIVE

## 2018-06-23 LAB — ETHANOL URINE: ETHANOL URINE: NEGATIVE

## 2018-06-23 LAB — FENTANYL URINE: FENTANYL URINE: NEGATIVE

## 2018-06-23 LAB — BUPRENORPHINE SCREEN URINE: BUPRENORPHINE SCREEN URINE: POSITIVE

## 2018-06-23 NOTE — Progress Notes (Signed)
Urine collected  [place], [place], 06/23/2018

## 2018-06-24 ENCOUNTER — Other Ambulatory Visit (HOSPITAL_BASED_OUTPATIENT_CLINIC_OR_DEPARTMENT_OTHER): Payer: Self-pay | Admitting: Internal Medicine

## 2018-06-24 ENCOUNTER — Telehealth (INDEPENDENT_AMBULATORY_CARE_PROVIDER_SITE_OTHER): Payer: Self-pay | Admitting: Orthopaedic Surgery

## 2018-06-24 ENCOUNTER — Ambulatory Visit (INDEPENDENT_AMBULATORY_CARE_PROVIDER_SITE_OTHER): Payer: 59 | Admitting: Orthopaedic Surgery

## 2018-06-24 ENCOUNTER — Encounter (INDEPENDENT_AMBULATORY_CARE_PROVIDER_SITE_OTHER): Payer: Self-pay | Admitting: Orthopaedic Surgery

## 2018-06-24 DIAGNOSIS — F1120 Opioid dependence, uncomplicated: Secondary | ICD-10-CM

## 2018-06-24 DIAGNOSIS — M2021 Hallux rigidus, right foot: Secondary | ICD-10-CM

## 2018-06-24 NOTE — Progress Notes (Signed)
PER Pharmacy, Cheryl Dixon is a 63 year old female has requested a refill of incruse ellipta.      Last Office Visit: 06/08/2017 with [name]  Last Physical Exam: 05/27/2013      Other Med Adult:  Most Recent BP Reading(s)  06/14/18 : 160/84        Cholesterol (mg/dl)   Date Value   01/04/2010 264 (H)     LOW DENSITY LIPOPROTEIN DIRECT (mg/dl)   Date Value   01/04/2010 101 (H)     HIGH DENSITY LIPOPROTEIN (mg/dl)   Date Value   01/04/2010 38     No results found for: TG      THYROID SCREEN TSH REFLEX FT4 (uIU/mL)   Date Value   05/27/2013 2.130         No results found for: TSH    HEMOGLOBIN A1C (%)   Date Value   05/27/2013 5.9 (H)       No results found for: POCA1C      INR (no units)   Date Value   02/16/2007 1.0 (L)   07/03/2006 < 1.0 (L)       SODIUM (mmol/L)   Date Value   06/14/2018 143       POTASSIUM (mmol/L)   Date Value   06/14/2018 4.5           CREATININE (mg/dL)   Date Value   06/14/2018 0.9       Documented patient preferred pharmacies:    [place], [place], [place]  Phone: [phone] Fax: [phone]

## 2018-06-24 NOTE — Progress Notes (Signed)
Post-Op Visit Note   Patient: Michelle Roberts           Date of Birth: 1956-03-19           MRN: [id number] Visit Date: 06/24/2018 PCP: Antony Contras, MD   Assessment & Plan:  Chief Complaint:  Chief Complaint  Patient presents with  . Right Foot - Pain   Visit Diagnoses:  1. Hallux rigidus, right foot     Plan: Patient is a pleasant 63 year old female who presents our clinic today for follow-up of her right great toe fusion, date of surgery 02/03/2018.  Previous x-rays revealed a complete fusion.  The only pain she has a soreness to the great toe.  She has finished formal therapy.  She continues to work on home exercise program.  Her main issue remains the inability for her great toe to touch the ground in which causes blistering to the bottom of the second toe.  She has been wearing a toe spacer which does seem to help.  She has bought custom orthotics from [employer] as well as to extrawide pairs of shoes.  When she puts orthotic in the shoes, it is either too small or too big in her foot feels though it skin to come out.  She is unable to get into a comfortable wearing shoe.  She is able to stand for longer periods of time.  Examination of her right great toe shows a well-healed surgical incision.  This toe does not touch the ground when she is standing upright.  She has minimal tenderness.  She is neurovascularly intact distally. She will follow up with [employer] for them to adjust her orthotics.  She will follow up with [place] as needed.   Follow-Up Instructions: Return if symptoms worsen or fail to improve.   Orders:  No orders of the defined types were placed in this encounter.  No orders of the defined types were placed in this encounter.   Imaging: No new imaging  PMFS History: Patient Active Problem List   Diagnosis Date Noted  . Hallux rigidus, right foot 02/03/2018  . 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 Fibromyalgia 08/12/2016  . High risk medication use 08/12/2016  .  Primary osteoarthritis of both hands 08/12/2016  . Acute midline low back pain 08/12/2016  . DDD (degenerative disc disease), lumbar 08/12/2016  . History of hypertension 08/12/2016  . History of high cholesterol 08/12/2016  . Thyroid ca ([place]) 08/12/2016  . History of hypothyroidism 08/12/2016  . History of gastroesophageal reflux (GERD) 08/12/2016  . History of TMJ syndrome 08/12/2016  . Burning tongue syndrome 08/12/2016  . History of vitamin D deficiency 08/12/2016  . Other sleep apnea 08/12/2016  . Vitamin D deficiency 08/12/2016  . Rheumatoid arthritis with rheumatoid factor of multiple sites without organ or systems involvement ([place]) 12/31/2015  . Goals of care, counseling/discussion 06/02/2013  . Myofascial muscle pain 06/02/2013  . Narcotic-induced mood disorder ([place]) 06/02/2013  . Inflammatory arthritis 04/08/2011  . Plantar fasciitis 04/08/2011  . Yeast infection 04/08/2011   Past Medical History:  Diagnosis Date  . Anxiety   . Arthritis   . Depression   . Fibromyalgia   . GERD (gastroesophageal reflux disease)   . Hyperlipidemia   . Hypertension   . Hypothyroidism    thyroidectomy  . Neoplasm of parotid gland   . Neuromuscular disorder ([place])   . Rheumatoid aortitis   . Sleep apnea    uses CPAP sometimes    Family  History  Problem Relation Age of Onset  . Alzheimer's disease Mother   . Alzheimer's disease Sister     Past Surgical History:  Procedure Laterality Date  . ABDOMINAL HYSTERECTOMY    . CHOLECYSTECTOMY    . HAMMER TOE SURGERY    . MASS EXCISION N/A 11/17/2017   Procedure: EXCISION SOFT PALATE LESION;  Surgeon: Rozetta Nunnery, MD;  Location: [facility];  Service: ENT;  Laterality: N/A;  . PAROTIDECTOMY Left 04/08/2016   Procedure: LEFT SUPERFICIAL PAROTIDECTOMY WITH FACIAL NERVE DISECTION;  Surgeon: Rozetta Nunnery, MD;  Location: [facility];  Service: ENT;  Laterality: Left;  . TARSAL METATARSAL  ARTHRODESIS Right 02/03/2018   Procedure: RIGHT GREAT TOE  FUSION;  Surgeon: Leandrew Koyanagi, MD;  Location: [facility];  Service: Orthopedics;  Laterality: Right;  . THYROIDECTOMY     Social History   Occupational History  . Not on file  Tobacco Use  . Smoking status: Never Smoker  . Smokeless tobacco: Never Used  Substance and Sexual Activity  . Alcohol use: No  . Drug use: No  . Sexual activity: Not on file

## 2018-06-24 NOTE — Telephone Encounter (Signed)
Patient called requesting a renewal for her handicap sticker.  Please call patient to advise.  [phone]

## 2018-06-24 NOTE — Telephone Encounter (Signed)
Please advise 

## 2018-06-25 NOTE — Discharge Instructions (Signed)
PODIATRY POST-OPERATIVE INSTRUCTIONS  ...................................................................................................................  1. Have prescriptions filled immediately and follow the instructions for use.   2. Rest in bed with your foot (feet) elevated on two pillows above the level of your heart at all times.   3. Keep your bandages clean and dry. You may not take a shower or a bath. You may sponge bathe only. If your foot gets wet for any reason, call your doctor immediately. Do not remove any of the bandages.  4. Apply an ice pack to the top of your foot/ankle or behind your knee for 30 minutes of each hour until you go to bed tonight and waking for the next 3 days.   5. ALWAYS wear your Post-Operative shoe when standing or walking, even for a short distance.   6. If you have been given a splint, do not remove it. If it feels too tight or interferes with your sensation (numbness, tingling) or circulation (pain or color changes in toes), call your doctor immediately.   7. If you have been given crutches, be very cautious when you get up, and always have someone available to assist you.   8. Do not drink any alcoholic beverages while taking pain medications.  9. Do not drive until your doctor says you may do so.   10. Call your doctor IMMEDIATELY if:   Your pain medications seem to be ineffective.    There is continued bleeding through the bandage.    You fall or injure your foot and or ankle.    Your temperature is 101.4 F or greater.    Your bandage becomes wet, soiled, or is falling off.    You have a pin that seems to be working itself out.    You have a change in appetite or bowel habits.  11. You may walk only as directed:                   You can partially place weight on the heel of your surgical foot as tolerated in post-op shoe. Use a walker for balance.  Limit walking for the first 2-3 days.   12. A doctor is on call 24 hours per day to answer any questions or  concerns and handle any emergency which may arise:   [facility] Phone #: [phone]   Podiatry Pager #: [phone]   Please call [phone] to arrange or confirm your post-operative appointment:    07/02/2018 10:30 AM Astrid Divine [facility]       Additional Information:   Narcotic pain medication prescription refill requests require 48 hours advance notice and a paper prescription must be picked up in person during regular weekday business hours if the refill request is granted. Refills cannot be given over the weekend.

## 2018-06-25 NOTE — Telephone Encounter (Signed)
Ready for pick up at the front desk, called patient to let her know.

## 2018-06-25 NOTE — Telephone Encounter (Signed)
Ok to do for 3 months

## 2018-06-28 ENCOUNTER — Encounter (HOSPITAL_BASED_OUTPATIENT_CLINIC_OR_DEPARTMENT_OTHER): Payer: Self-pay | Admitting: Foot & Ankle Surgery

## 2018-07-02 ENCOUNTER — Ambulatory Visit (HOSPITAL_BASED_OUTPATIENT_CLINIC_OR_DEPARTMENT_OTHER): Payer: Self-pay | Admitting: Podiatrist

## 2018-07-06 ENCOUNTER — Other Ambulatory Visit (HOSPITAL_BASED_OUTPATIENT_CLINIC_OR_DEPARTMENT_OTHER): Payer: Self-pay | Admitting: Family Medicine

## 2018-07-06 ENCOUNTER — Ambulatory Visit: Payer: 59 | Attending: Family Medicine | Admitting: Family Medicine

## 2018-07-06 DIAGNOSIS — F1120 Opioid dependence, uncomplicated: Secondary | ICD-10-CM | POA: Insufficient documentation

## 2018-07-06 LAB — OXYCODONE SCREEN URINE
OXYCOD SCRN URINE: NEGATIVE
OXYCOD UR SPEC GRAV: 1.004 (ref 1.003–1.035)
OXYCOD URINE CREAT: 49 md/dL
OXYCOD URINE PH: 5 (ref 4.5–8.5)

## 2018-07-06 LAB — CANNABINOIDS URINE: CANNABINOIDS URINE: POSITIVE — AB

## 2018-07-06 LAB — COCAINE METABOLITES URINE: COCAINE METABOLITES URINE: NEGATIVE

## 2018-07-06 LAB — METHADONE URINE: METHADONE URINE: NEGATIVE

## 2018-07-06 LAB — BUPRENORPHINE SCREEN URINE: BUPRENORPHINE SCREEN URINE: POSITIVE

## 2018-07-06 LAB — AMPHETAMINES URINE: AMPHETAMINES URINE: NEGATIVE

## 2018-07-06 LAB — OPIATES URINE: OPIATES URINE: NEGATIVE

## 2018-07-06 LAB — BENZODIAZEPINES URINE: BENZODIAZEPINES URINE: NEGATIVE

## 2018-07-06 LAB — FENTANYL URINE: FENTANYL URINE: NEGATIVE

## 2018-07-06 LAB — ETHANOL URINE: ETHANOL URINE: NEGATIVE

## 2018-07-06 MED ORDER — BUPRENORPHINE HCL-NALOXONE HCL 8-2 MG SL FILM: 2 | Film | Freq: Every day | SUBLINGUAL | 0 refills | 0 days | Status: AC

## 2018-07-06 MED ORDER — BUPRENORPHINE HCL-NALOXONE HCL 8-2 MG SL FILM
2.0000 | ORAL_FILM | Freq: Every day | SUBLINGUAL | 0 refills | Status: DC
Start: 2018-07-06 — End: 2018-07-20

## 2018-07-08 NOTE — Progress Notes (Signed)
Maribella Kuna is a 63 year old female seen in follow up for opioid dependence:    Buprenorphine dose:16 mg  Response, adequacy of dose:good  Relapses/close calls:none  Trigger:n/a  Meetings:no    Social history/events:  Doing well  Feeling good, getting outside, spending a lot of time with her family    Present Medications:    Current Outpatient Medications on File Prior to Visit:  INCRUSE ELLIPTA 62.5 MCG/INH inhaler INHALE 1 PUFF BY MOUTH INTO LUNGS DAILY Disp: 1 Inhaler Rfl: 11   HEALTHYLAX packet Take 17 g by mouth daily Mix in 64 ounces of clear liquid and take as directed Disp: 28 packet Rfl: 2   buPROPion (WELLBUTRIN SR) 200 MG 12 hr tablet Take 1 tablet by mouth 2 (two) times daily Disp: 180 tablet Rfl: 1   naproxen (NAPROSYN) 500 MG tablet Take 1 tablet by mouth every 12 (twelve) hours as needed for Pain as needed for pain. Take with food  for up to 10 days Disp: 20 tablet Rfl: 0   albuterol (PROVENTIL HFA,VENTOLIN HFA, PROAIR HFA) 108 (90 Base) MCG/ACT inhaler Inhale 2 puffs into the lungs every 6 (six) hours as needed for Wheezing or Shortness of breath Disp: 1 Inhaler Rfl: 2   ipratropium-albuterol (DUO-NEB) 0.5-2.5 (3) MG/3ML SOLN Inhalation Solution Take 3 mLs by nebulization 4 (four) times daily. Disp: 180 mL Rfl: 0     No current facility-administered medications on file prior to visit.     Review of Systems:  Neurological exam: negative  PHQ-9 TOTAL SCORE 06/01/2018 03/03/2017 01/05/2017   Doc FlowSheet Total Score - - -   Doc FlowSheet Total Score 6 4 22            PHYSICAL EXAMINATION:  General appearance - healthy female in no distress  Eyes - pupils 85mm  Skin - warm and dry  Neuro - nonfocal  Affect - normal    PMP reviewed. No unauthorized prescriptions since last refill.     Assessment:  Opioid Dependence  Comment: condition gradually improving on current dose, actively participating in group.  Plan:  Continue buprenorphine, reviewed criteria for tapering when patient is  ready.    Patient is counseled regarding relapse prevention, involvement in recovery groups, potential side effects of buprenorphine, and eventual taper of medication.    1. The patient indicates understanding of these issues and agrees with the plan.  2.  The patient is given an After Visit Summary sheet that lists all of their medications with directions, their allergies, orders placed during this encounter, immunization dates, and follow- up instructions.  3. I reviewed the patient's medical information and medical history   4.  I reconciled the patient's medication list and prepared and supplied needed refills.  5.  I have reviewed the past medical, family, and social history sections including the medications and allergies listed in the above medical record  [name], MD, 07/08/2018

## 2018-07-13 ENCOUNTER — Telehealth (HOSPITAL_BASED_OUTPATIENT_CLINIC_OR_DEPARTMENT_OTHER): Payer: Self-pay

## 2018-07-13 NOTE — Progress Notes (Signed)
Called home# spoke with patient   Notified group is canceled . Verified pharmacy -[facility] - explained to patient appt wil be changed to televisit . Script will be sent to pharmacy when script is due   patient agreed with plan

## 2018-07-14 DIAGNOSIS — I10 Essential (primary) hypertension: Secondary | ICD-10-CM | POA: Diagnosis not present

## 2018-07-14 DIAGNOSIS — R202 Paresthesia of skin: Secondary | ICD-10-CM | POA: Diagnosis not present

## 2018-07-14 DIAGNOSIS — R000 Tachycardia, unspecified: Secondary | ICD-10-CM | POA: Diagnosis not present

## 2018-07-20 ENCOUNTER — Ambulatory Visit: Payer: 59 | Admitting: Family Medicine

## 2018-07-20 ENCOUNTER — Other Ambulatory Visit: Payer: Self-pay

## 2018-07-20 DIAGNOSIS — F1120 Opioid dependence, uncomplicated: Secondary | ICD-10-CM

## 2018-07-20 MED ORDER — BUPRENORPHINE HCL-NALOXONE HCL 8-2 MG SL FILM
2.0000 | ORAL_FILM | Freq: Every day | SUBLINGUAL | 0 refills | Status: DC
Start: 2018-07-20 — End: 2018-07-21

## 2018-07-20 MED ORDER — BUPRENORPHINE HCL-NALOXONE HCL 8-2 MG SL FILM: 2 | Film | Freq: Every day | SUBLINGUAL | 0 refills | 0 days | Status: AC

## 2018-07-21 ENCOUNTER — Other Ambulatory Visit (HOSPITAL_BASED_OUTPATIENT_CLINIC_OR_DEPARTMENT_OTHER): Payer: Self-pay | Admitting: Family Medicine

## 2018-07-21 DIAGNOSIS — F1120 Opioid dependence, uncomplicated: Secondary | ICD-10-CM

## 2018-07-21 MED ORDER — NICOTINE 21 MG/24HR TD PT24
1.00 | MEDICATED_PATCH | Freq: Every day | TRANSDERMAL | 2 refills | Status: AC
Start: 2018-07-21 — End: 2018-09-19

## 2018-07-21 MED ORDER — BUPRENORPHINE HCL-NALOXONE HCL 8-2 MG SL FILM
2.0000 | ORAL_FILM | Freq: Every day | SUBLINGUAL | 0 refills | Status: DC
Start: 2018-07-21 — End: 2018-08-16

## 2018-07-21 MED ORDER — NICOTINE 21 MG/24HR TD PT24: 1 | patch | Freq: Every day | TRANSDERMAL | 2 refills | 0 days | Status: AC

## 2018-07-21 MED ORDER — BUPRENORPHINE HCL-NALOXONE HCL 8-2 MG SL FILM: 2 | Film | Freq: Every day | SUBLINGUAL | 0 refills | 0 days | Status: AC

## 2018-07-21 NOTE — Addendum Note (Signed)
Addended by: [name] on: 07/21/2018 10:36 AM     Modules accepted: Orders

## 2018-08-03 ENCOUNTER — Other Ambulatory Visit: Payer: Self-pay

## 2018-08-03 ENCOUNTER — Ambulatory Visit: Payer: 59 | Admitting: Registered Nurse

## 2018-08-03 DIAGNOSIS — F1121 Opioid dependence, in remission: Secondary | ICD-10-CM

## 2018-08-03 NOTE — Progress Notes (Signed)
Cheryl Dixon is a 63 year old female seen in follow up for opioid dependence:    Buprenorphine dose: 16 mg  Response, adequacy of dose: Good  Relapses/close calls: None  Trigger: None  Meetings: Not at this time    Social history/events:  Spoke with pt via phone today as groups are cancelled.  States she is staying home and staying healthy.  Taking her suboxone and has had no cravings or use of any kind.  She is aware that she can call me with any questions or concerns.      Present Medications:See chart    Current Outpatient Medications on File Prior to Visit:  buprenorphine-naloxone (SUBOXONE) 8-2 MG sublingual film Place 2 Film under the tongue daily Max Daily Amount: 2 Film Disp: 56 Film Rfl: 0   nicotine (NICODERM CQ) 21 MG/24HR Place 1 patch onto the skin daily Disp: 30 patch Rfl: 2   INCRUSE ELLIPTA 62.5 MCG/INH inhaler INHALE 1 PUFF BY MOUTH INTO LUNGS DAILY Disp: 1 Inhaler Rfl: 11   HEALTHYLAX packet Take 17 g by mouth daily Mix in 64 ounces of clear liquid and take as directed Disp: 28 packet Rfl: 2   buPROPion (WELLBUTRIN SR) 200 MG 12 hr tablet Take 1 tablet by mouth 2 (two) times daily Disp: 180 tablet Rfl: 1   naproxen (NAPROSYN) 500 MG tablet Take 1 tablet by mouth every 12 (twelve) hours as needed for Pain as needed for pain. Take with food  for up to 10 days Disp: 20 tablet Rfl: 0   albuterol (PROVENTIL HFA,VENTOLIN HFA, PROAIR HFA) 108 (90 Base) MCG/ACT inhaler Inhale 2 puffs into the lungs every 6 (six) hours as needed for Wheezing or Shortness of breath Disp: 1 Inhaler Rfl: 2   ipratropium-albuterol (DUO-NEB) 0.5-2.5 (3) MG/3ML SOLN Inhalation Solution Take 3 mLs by nebulization 4 (four) times daily. Disp: 180 mL Rfl: 0     No current facility-administered medications on file prior to visit.     PHQ-9 TOTAL SCORE 06/01/2018 03/03/2017 01/05/2017   Doc FlowSheet Total Score - - -   Doc FlowSheet Total Score 6 4 22          PMP reviewed and appropriate.     Patient is counseled regarding relapse  prevention, involvement in recovery groups, potential side effects of buprenorphine, and eventual taper of medication

## 2018-08-16 ENCOUNTER — Other Ambulatory Visit (HOSPITAL_BASED_OUTPATIENT_CLINIC_OR_DEPARTMENT_OTHER): Payer: Self-pay | Admitting: Family Medicine

## 2018-08-16 DIAGNOSIS — F1120 Opioid dependence, uncomplicated: Secondary | ICD-10-CM

## 2018-08-16 NOTE — Progress Notes (Unsigned)
PER Pharmacy, Cheryl Dixon is a 63 year old female has requested a refill of suboxone.  Written Date: 07/21/18 Expiration Date: --     Start Date: 07/21/18 End Date: 08/18/18 after 28 doses                  Last Office Visit: 07/06/2018 with [name]  Last Physical Exam: 05/27/2013     COLONOSCOPY due on 08/22/2017     Other Med Adult:  Most Recent BP Reading(s)  06/14/18 : 160/84        Cholesterol (mg/dl)   Date Value   01/04/2010 264 (H)     LOW DENSITY LIPOPROTEIN DIRECT (mg/dl)   Date Value   01/04/2010 101 (H)     HIGH DENSITY LIPOPROTEIN (mg/dl)   Date Value   01/04/2010 38     No results found for: TG      THYROID SCREEN TSH REFLEX FT4 (uIU/mL)   Date Value   05/27/2013 2.130         No results found for: TSH    HEMOGLOBIN A1C (%)   Date Value   05/27/2013 5.9 (H)       No results found for: POCA1C      INR (no units)   Date Value   02/16/2007 1.0 (L)   07/03/2006 < 1.0 (L)       SODIUM (mmol/L)   Date Value   06/14/2018 143       POTASSIUM (mmol/L)   Date Value   06/14/2018 4.5           CREATININE (mg/dL)   Date Value   06/14/2018 0.9        Documented patient preferred pharmacies:

## 2018-08-17 ENCOUNTER — Ambulatory Visit: Payer: 59 | Attending: Internal Medicine | Admitting: Registered Nurse

## 2018-08-17 ENCOUNTER — Encounter (HOSPITAL_BASED_OUTPATIENT_CLINIC_OR_DEPARTMENT_OTHER): Payer: Self-pay | Admitting: Registered Nurse

## 2018-08-17 DIAGNOSIS — F17200 Nicotine dependence, unspecified, uncomplicated: Secondary | ICD-10-CM | POA: Insufficient documentation

## 2018-08-17 DIAGNOSIS — IMO0001 Reserved for inherently not codable concepts without codable children: Secondary | ICD-10-CM

## 2018-08-17 DIAGNOSIS — F1120 Opioid dependence, uncomplicated: Secondary | ICD-10-CM | POA: Diagnosis present

## 2018-08-17 MED ORDER — BUPRENORPHINE HCL-NALOXONE HCL 8-2 MG SL FILM: Film | SUBLINGUAL | 0 refills | 0 days | Status: DC

## 2018-08-17 MED ORDER — BUPRENORPHINE HCL-NALOXONE HCL 8-2 MG SL FILM: Film | SUBLINGUAL | 0 refills | 0 days | Status: AC

## 2018-08-17 MED ORDER — BUPRENORPHINE HCL-NALOXONE HCL 8-2 MG SL FILM
ORAL_FILM | SUBLINGUAL | 0 refills | Status: DC
Start: 2018-08-17 — End: 2018-08-17

## 2018-08-17 MED ORDER — BUPRENORPHINE HCL-NALOXONE HCL 8-2 MG SL FILM
ORAL_FILM | SUBLINGUAL | 0 refills | Status: DC
Start: 2018-08-17 — End: 2018-09-14

## 2018-08-17 NOTE — Televisit Note (Signed)
This patient was identified as meeting criteria for a televisit rather than an in person visit due to public health concerns around COVID-19. A complete assessment and plan is detailed in the note, all of which were conducted remotely using telephone technology.  Patient identity was verbally confirmed by the patient/guardian with 2 identifiers (name, date of birth, and/or address) at the beginning of the visit  Patient/guardian verbally consented to care by televisit as appropriate.   Patient/guardian was located at home during the visit and confirmed that they understood they were encouraged to be in private location due to personal health information being discussed.  Patient/guardian was informed how to access face-to-face care in the event of an emergency.  Provider was located in a [facility] exam room during the visit.  If this is a new patient visit, all available records and medical history were reviewed by the provider.  Visit length was 15 minutes and counseling was done on the diagnoses indicated in the visit.    Cheryl Dixon is a 63 year old female seen in follow up for opioid dependence:    Buprenorphine dose: usually takes 1.5 films, but recently has been taking more since she got new box  Response, adequacy of dose: Was having chills, nausea, cramps in leg  Relapses/close calls: none  Trigger: n/a  Meetings: none    Social history/events:  Using patches to quit smoking  Having a cigarette now and then    Present Medications:    Current Outpatient Medications on File Prior to Visit:  buprenorphine-naloxone (SUBOXONE) 8-2 MG sublingual film Place 3 Film under the tongue daily Max Daily Amount: 3 Film Disp: 70 Film Rfl: 0   nicotine (NICODERM CQ) 21 MG/24HR Place 1 patch onto the skin daily Disp: 30 patch Rfl: 2   INCRUSE ELLIPTA 62.5 MCG/INH inhaler INHALE 1 PUFF BY MOUTH INTO LUNGS DAILY Disp: 1 Inhaler Rfl: 11   HEALTHYLAX packet Take 17 g by mouth daily Mix in 64 ounces of clear liquid and take  as directed Disp: 28 packet Rfl: 2   buPROPion (WELLBUTRIN SR) 200 MG 12 hr tablet Take 1 tablet by mouth 2 (two) times daily Disp: 180 tablet Rfl: 1   naproxen (NAPROSYN) 500 MG tablet Take 1 tablet by mouth every 12 (twelve) hours as needed for Pain as needed for pain. Take with food  for up to 10 days Disp: 20 tablet Rfl: 0   albuterol (PROVENTIL HFA,VENTOLIN HFA, PROAIR HFA) 108 (90 Base) MCG/ACT inhaler Inhale 2 puffs into the lungs every 6 (six) hours as needed for Wheezing or Shortness of breath Disp: 1 Inhaler Rfl: 2   ipratropium-albuterol (DUO-NEB) 0.5-2.5 (3) MG/3ML SOLN Inhalation Solution Take 3 mLs by nebulization 4 (four) times daily. Disp: 180 mL Rfl: 0     No current facility-administered medications on file prior to visit.     PMP reviewed.  No unauthorized prescriptions since last refill.    Assessment:  Opioid Dependence  Comment: discussed importance of taking a consistent dose, not trying to taper too quickly.  Plan:  Continue buprenorphine 2.5 films daily  Patient is counseled regarding relapse prevention, involvement in recovery groups, potential side effects of buprenorphine, and eventual taper of medication.    Smoking  Stick with 21 mcg film for now, will probably taper after another month  She will call for step 2 patch when she is ready.    We discussed the patients current medications. The patient expressed understanding and no barriers to  adherence were identified.  1. The patient indicates understanding of these issues and agrees with the plan.  Brief care plan is updated and reviewed with the patient.   2. The patient is given an After Visit Summary sheet that lists all medications with directions, allergies, orders placed during this encounter, and follow-up instructions.   3. I reviewed the patient's medical information and medical history   4. I reconciled the patient's medication list and prepared and supplied needed refills.   5. I have reviewed the past medical, family, and  social history sections including the medications and allergies.    [name], MD

## 2018-08-26 DIAGNOSIS — G4700 Insomnia, unspecified: Secondary | ICD-10-CM | POA: Diagnosis not present

## 2018-08-26 DIAGNOSIS — I10 Essential (primary) hypertension: Secondary | ICD-10-CM | POA: Diagnosis not present

## 2018-08-26 DIAGNOSIS — E790 Hyperuricemia without signs of inflammatory arthritis and tophaceous disease: Secondary | ICD-10-CM | POA: Diagnosis not present

## 2018-08-27 ENCOUNTER — Telehealth (HOSPITAL_BASED_OUTPATIENT_CLINIC_OR_DEPARTMENT_OTHER): Payer: Self-pay | Admitting: Registered Nurse

## 2018-08-27 NOTE — Progress Notes (Signed)
Left vm for pt to call rcc back.

## 2018-08-27 NOTE — Telephone Encounter (Signed)
-----   Message from [place] sent at 08/27/2018 12:24 PM EDT -----  Regarding: Requesting Letter  Contact: [phone]  Cheryl Dixon [id number], 63 year old, female    Calls today:  Clinical Questions ([place])    Name of person calling Patient  Specific nature of request Requesting letter stating that she suffers from COPD. Pt would like the letter to be mailed home, confirmed address in Epic.  Return phone number [phone]    Patient's language of care: English    Patient does not need an interpreter.    Patient's PCP: [name], MD

## 2018-08-31 ENCOUNTER — Ambulatory Visit: Payer: 59 | Admitting: Registered Nurse

## 2018-09-08 ENCOUNTER — Telehealth (HOSPITAL_BASED_OUTPATIENT_CLINIC_OR_DEPARTMENT_OTHER): Payer: Self-pay

## 2018-09-08 NOTE — Progress Notes (Signed)
Called Cheryl Dixon to relay the following message:     I am calling to see how you are doing during this very difficult time.  Your PCP wanted me to call you because of your age, since it means you have a higher chance of getting very sick if you get COVID.    We are advising our patients, especially those who are higher risk like yourself, to follow precautions to stay safe.  The virus is passed from person through person in droplets in the air, and stays on some surfaces for a while.   We want you to know that we are still here. Please do call us with any health questions, refills or other concerns. While we are limiting the number of in person visits to reduce the chance of spreading the virus, we do have televisits with providers.     Id like to give you some information now, it may be helpful to have a pen and paper handy.  I would advise that you:  -Stay home. Limit how much you go out as much as possible.  -limit who comes to visit you as much as possible.  -Wash your hands frequently, and dont touch your face. You can use an alcohol based sanitizer, or wash your hands with soap and water for 20 seconds.  -Sneeze or cough into a kleenex, then throw it away and wash your hands.  -If other people live in your house, have them limit how much they go out as much as possible.   -If someone has to go out, they should wash their hands when they come back into the house.    -While outside they should stay at least 6 feet away from others.  -Commonly touched surfaces in the house should be cleaned at least once a day with approved cleaners: handles, table tops, door knobs, surfaces     If you start feeling sick- with cough, fever, or trouble breathing, or sore throat, runny nose, or you suddenly cant smell anymore - call us immediately. We will evaluate you over the phone, and if needed, have you tested, and possibly evaluated in person.     If you have severe symptoms at any point, especially severe trouble  breathing, call 911. Let the operator know that you might have [place].     In addition to any concerns about COVID, I want you to know that we are still here to help take care of you. Whether you need refills for your medication, or other health concerns, or any changes in your health, please call us.     Do you need any refills today? [if so, use usual refill process and route message to Refill Pool]  Do you have any non-COVID related questions or concerns? [if so, follow usual process routing to either nurse or setting up televisit]     Id like to make sure you have the phone number to our clinic.  [if they dont have it]: Please write this phone number down in case you need anything: [phone]     Please stay safe and healthy.    Mont Dutton, [place], 09/08/2018

## 2018-09-10 DIAGNOSIS — E790 Hyperuricemia without signs of inflammatory arthritis and tophaceous disease: Secondary | ICD-10-CM | POA: Diagnosis not present

## 2018-09-10 DIAGNOSIS — E782 Mixed hyperlipidemia: Secondary | ICD-10-CM | POA: Diagnosis not present

## 2018-09-10 NOTE — Progress Notes (Signed)
Left message to call office re: refill request.

## 2018-09-14 ENCOUNTER — Ambulatory Visit: Payer: 59 | Admitting: Registered Nurse

## 2018-09-14 ENCOUNTER — Other Ambulatory Visit (HOSPITAL_BASED_OUTPATIENT_CLINIC_OR_DEPARTMENT_OTHER): Payer: Self-pay | Admitting: Family Medicine

## 2018-09-14 DIAGNOSIS — F1121 Opioid dependence, in remission: Secondary | ICD-10-CM

## 2018-09-14 DIAGNOSIS — F1120 Opioid dependence, uncomplicated: Secondary | ICD-10-CM

## 2018-09-14 MED ORDER — BUPRENORPHINE HCL-NALOXONE HCL 8-2 MG SL FILM: Film | SUBLINGUAL | 0 refills | 0 days | Status: AC

## 2018-09-14 MED ORDER — BUPRENORPHINE HCL-NALOXONE HCL 8-2 MG SL FILM
ORAL_FILM | SUBLINGUAL | 0 refills | Status: DC
Start: 2018-09-14 — End: 2018-10-12

## 2018-09-14 NOTE — Televisit Note (Signed)
This patient was identified as meeting criteria for a televisit rather than an in person visit due to public health concerns around COVID-19. A complete assessment and plan is detailed in the note, all of which were conducted remotely using phone    technology.  Patient identity was verbally confirmed by the patient/guardian with 2 identifiers (name, date of birth, and/or address) at the beginning of the visit  Patient/guardian verbally consented to care by televisit as appropriate.   Patient/guardian was located home during the visit and confirmed that they understood they were encouraged to be in private location due to personal health information being discussed.  Patient/guardian was informed how to access face-to-face care in the event of an emergency.  Provider was located home during the visit.  If this is a new patient visit, all available records and medical history were reviewed by the provider.  Visit length was 10 minutes and counseling was done on the diagnoses indicated in the visit.      Placed a call to Krystle as group meetings are cancelled going forward until further notice.  I was able to leave a message on her phone with a request that she return my call.

## 2018-09-16 DIAGNOSIS — I10 Essential (primary) hypertension: Secondary | ICD-10-CM | POA: Diagnosis not present

## 2018-09-16 DIAGNOSIS — D509 Iron deficiency anemia, unspecified: Secondary | ICD-10-CM | POA: Diagnosis not present

## 2018-09-16 DIAGNOSIS — R7303 Prediabetes: Secondary | ICD-10-CM | POA: Diagnosis not present

## 2018-09-16 DIAGNOSIS — E039 Hypothyroidism, unspecified: Secondary | ICD-10-CM | POA: Diagnosis not present

## 2018-09-17 ENCOUNTER — Other Ambulatory Visit (HOSPITAL_BASED_OUTPATIENT_CLINIC_OR_DEPARTMENT_OTHER): Payer: Self-pay | Admitting: Internal Medicine

## 2018-09-17 NOTE — Progress Notes (Signed)
PER Pharmacy, Cheryl Dixon is a 63 year old female has requested a refill of Naproxen.      Last Office Visit: 07/06/18 with Quenton Fetter  Last Physical Exam: 05/27/13    COLONOSCOPY due on 08/22/2017    Other Med Adult:  Most Recent BP Reading(s)  06/14/18 : 160/84        Cholesterol (mg/dl)   Date Value   01/04/2010 264 (H)     LOW DENSITY LIPOPROTEIN DIRECT (mg/dl)   Date Value   01/04/2010 101 (H)     HIGH DENSITY LIPOPROTEIN (mg/dl)   Date Value   01/04/2010 38     No results found for: TG      THYROID SCREEN TSH REFLEX FT4 (uIU/mL)   Date Value   05/27/2013 2.130         No results found for: TSH    HEMOGLOBIN A1C (%)   Date Value   05/27/2013 5.9 (H)       No results found for: POCA1C      INR (no units)   Date Value   02/16/2007 1.0 (L)   07/03/2006 < 1.0 (L)       SODIUM (mmol/L)   Date Value   06/14/2018 143       POTASSIUM (mmol/L)   Date Value   06/14/2018 4.5           CREATININE (mg/dL)   Date Value   06/14/2018 0.9       Documented patient preferred pharmacies:    [place], [place] - [phone]  Phone: [phone] Fax: [facility] - [address] [place]  Phone: [phone] Fax: [phone]

## 2018-09-28 ENCOUNTER — Ambulatory Visit: Payer: 59 | Attending: Family Medicine | Admitting: Family Medicine

## 2018-09-28 DIAGNOSIS — F1120 Opioid dependence, uncomplicated: Secondary | ICD-10-CM | POA: Insufficient documentation

## 2018-09-28 MED ORDER — ALBUTEROL SULFATE HFA 108 (90 BASE) MCG/ACT IN AERS
2.0000 | INHALATION_SPRAY | Freq: Four times a day (QID) | RESPIRATORY_TRACT | 2 refills | Status: DC | PRN
Start: 2018-09-28 — End: 2019-04-17

## 2018-09-28 NOTE — Televisit Note (Signed)
Maronda Caison is a 63 year old female seen in follow up for opioid dependence:    Buprenorphine dose: 20mg   Response, adequacy of dose: good  Relapses/close calls: none  Trigger: none  Meetings: none    Social history/events:  Doing fine  Not working, but getting paid for it - works for school as crossing guard  Not doing too much - on phone a lot  Has done a lot of cleaning    Present Medications:    Current Outpatient Medications on File Prior to Visit:  naproxen (NAPROSYN) 500 MG tablet TAKE 1 TABLET BY MOUTH WITH FOOD EVERY TWELVE HOURS AS NEEDED FOR PAIN Disp: 20 tablet Rfl: 0   buprenorphine-naloxone (SUBOXONE) 8-2 MG sublingual film Take two and one-half films daily Disp: 70 Film Rfl: 0   INCRUSE ELLIPTA 62.5 MCG/INH inhaler INHALE 1 PUFF BY MOUTH INTO LUNGS DAILY Disp: 1 Inhaler Rfl: 11   buPROPion (WELLBUTRIN SR) 200 MG 12 hr tablet Take 1 tablet by mouth 2 (two) times daily Disp: 180 tablet Rfl: 1   [DISCONTINUED] albuterol (PROVENTIL HFA,VENTOLIN HFA, PROAIR HFA) 108 (90 Base) MCG/ACT inhaler Inhale 2 puffs into the lungs every 6 (six) hours as needed for Wheezing or Shortness of breath Disp: 1 Inhaler Rfl: 2   ipratropium-albuterol (DUO-NEB) 0.5-2.5 (3) MG/3ML SOLN Inhalation Solution Take 3 mLs by nebulization 4 (four) times daily. Disp: 180 mL Rfl: 0     No current facility-administered medications on file prior to visit.     Review of Systems:  Neurological exam: negative  PHQ-9 TOTAL SCORE 06/01/2018 03/03/2017 01/05/2017   Doc FlowSheet Total Score - - -   Doc FlowSheet Total Score 6 4 22            PHYSICAL EXAMINATION:    Deferred due to televisit    PMP reviewed. No unauthorized prescriptions since last refill.       Assessment:  Opioid Dependence  Comment: condition gradually improving on current dose, actively participating in group.  Plan:  Continue buprenorphine, reviewed criteria for tapering when patient is ready.    Patient is counseled regarding relapse prevention, involvement in recovery  groups, potential side effects of buprenorphine, and eventual taper of medication.    [name], MD, 09/28/2018

## 2018-10-12 ENCOUNTER — Ambulatory Visit: Payer: 59 | Admitting: Family Medicine

## 2018-10-12 ENCOUNTER — Other Ambulatory Visit (HOSPITAL_BASED_OUTPATIENT_CLINIC_OR_DEPARTMENT_OTHER): Payer: Self-pay | Admitting: Family Medicine

## 2018-10-12 DIAGNOSIS — F1121 Opioid dependence, in remission: Secondary | ICD-10-CM

## 2018-10-12 DIAGNOSIS — F1120 Opioid dependence, uncomplicated: Secondary | ICD-10-CM

## 2018-10-12 MED ORDER — BUPRENORPHINE HCL-NALOXONE HCL 8-2 MG SL FILM
ORAL_FILM | SUBLINGUAL | 0 refills | Status: DC
Start: 2018-10-12 — End: 2018-10-27

## 2018-10-12 NOTE — Progress Notes (Signed)
Placed a call to patient.  Unable to reach.

## 2018-10-18 ENCOUNTER — Other Ambulatory Visit: Payer: Self-pay | Admitting: *Deleted

## 2018-10-18 DIAGNOSIS — Z20822 Contact with and (suspected) exposure to covid-19: Secondary | ICD-10-CM

## 2018-10-24 LAB — NOVEL CORONAVIRUS, NAA: SARS-CoV-2, NAA: NOT DETECTED

## 2018-10-26 ENCOUNTER — Ambulatory Visit: Payer: 59 | Admitting: Family Medicine

## 2018-10-26 DIAGNOSIS — Z5321 Procedure and treatment not carried out due to patient leaving prior to being seen by health care provider: Secondary | ICD-10-CM

## 2018-10-27 ENCOUNTER — Other Ambulatory Visit (HOSPITAL_BASED_OUTPATIENT_CLINIC_OR_DEPARTMENT_OTHER): Payer: Self-pay | Admitting: Family Medicine

## 2018-10-27 DIAGNOSIS — F1120 Opioid dependence, uncomplicated: Secondary | ICD-10-CM

## 2018-10-27 MED ORDER — BUPRENORPHINE HCL-NALOXONE HCL 8-2 MG SL FILM
ORAL_FILM | SUBLINGUAL | 0 refills | Status: DC
Start: 2018-10-27 — End: 2018-11-09

## 2018-10-27 NOTE — Progress Notes (Signed)
Patient left without being seen.

## 2018-10-28 ENCOUNTER — Other Ambulatory Visit: Payer: Self-pay

## 2018-11-02 ENCOUNTER — Other Ambulatory Visit: Payer: Self-pay

## 2018-11-02 ENCOUNTER — Ambulatory Visit: Payer: 59 | Attending: Internal Medicine | Admitting: Internal Medicine

## 2018-11-02 ENCOUNTER — Telehealth (HOSPITAL_BASED_OUTPATIENT_CLINIC_OR_DEPARTMENT_OTHER): Payer: Self-pay | Admitting: Registered Nurse

## 2018-11-02 ENCOUNTER — Telehealth (HOSPITAL_BASED_OUTPATIENT_CLINIC_OR_DEPARTMENT_OTHER): Payer: Self-pay

## 2018-11-02 ENCOUNTER — Ambulatory Visit (HOSPITAL_BASED_OUTPATIENT_CLINIC_OR_DEPARTMENT_OTHER): Payer: 59 | Admitting: Internal Medicine

## 2018-11-02 ENCOUNTER — Encounter (HOSPITAL_BASED_OUTPATIENT_CLINIC_OR_DEPARTMENT_OTHER): Payer: Self-pay | Admitting: Family Medicine

## 2018-11-02 DIAGNOSIS — M79671 Pain in right foot: Secondary | ICD-10-CM | POA: Insufficient documentation

## 2018-11-02 DIAGNOSIS — Z5321 Procedure and treatment not carried out due to patient leaving prior to being seen by health care provider: Secondary | ICD-10-CM | POA: Insufficient documentation

## 2018-11-02 DIAGNOSIS — M7989 Other specified soft tissue disorders: Secondary | ICD-10-CM | POA: Diagnosis present

## 2018-11-02 DIAGNOSIS — M25561 Pain in right knee: Secondary | ICD-10-CM | POA: Diagnosis not present

## 2018-11-02 NOTE — Progress Notes (Signed)
Patient presents with:  Other: Right Knee and Foot pain /swollen foot / I sent info to sign up for mychart, but is having trouble with Email  Other: Name and DOB veriifed  Other    Right knee pain  X 2 weeks  [place] about month ago   [place] onto her knee with shorts on  Did not hurt for at least a week  In the past injections of the knee for "inflammation"  Not had a cortisone shot in at least 7 years  Whole leg is swollen though there has been some improvement  Saw a foot doctor in January/February - was supposed to have a foot operation to straighten out her second toe that overlaps onto the great toe on that side  She thinks that is part of the reason for the pain but not sure  Has not been on it for past few days  Had swelling in the past  No redness  Does get hot sometimes  Knee cap seems more sore than the rest  Hurts more when she walks on it   Trouble walking up the stairs  No fever  She has tried advil and ice - which helped with ice  She was going to try prednisone    She says she increased her suboxone from 2-2.5 of her suboxone, she will take 2.5 because of she has the pain  She is having trouble getting in email - does not know how to work it and cannot due video visit or send pictures    She forgets a little bit more than normal recently    PMH/MEDS/ALL REVIEWED  Patient speaking full sentences without apparent distress  PE deferred due to televisit given public health issues related to COVID-19    A/P  Right knee pain, foot pain, and leg swelling - unclear etiology of her symptoms, but difficult to ascertain nature of the problem without some visual representation.  Therefore I have recommended an in person visit as patient unable to send pictures or do video visit.  Need to rule out DVT/need for [place] vs OA vs venous insufficiency vs strain vs other  - in person visit  - patient agrees with plan  - message sent to front desk    Screening for possible COVID:    1. In the past 14 days,   a. has the patient  had ANY (including chronic) cough, shortness of breath, or runny nose   b. or new fever, body aches, sore throat, nausea, diarrhea, red eye, or lack of smell/taste? No  2. In the past 14 days, has the patient had COVID or suspected COVID?  No    Telephone visit time: 11:46-12:02 (16 min)

## 2018-11-02 NOTE — Telephone Encounter (Signed)
-----   Message from Janann August, MD sent at 11/02/2018 12:00 PM EDT -----  Please schedule this patient for a visit at [facility]    If requesting a visit at any regional in-person clinic ([facility], [facility], [facility]), I have screened the patient for COVID using COVIDSCREENING in my note and the patient has answered "No" to questions 1 and 2: Yes    Reason for visit: knee/leg/foot pain and swelling - unable to send pics or do virtual visit  Schedule with: Any provider  Urgency: today  If requested appointment not available within the requested time frame: n/a    Janann August, MD, 11/02/2018

## 2018-11-02 NOTE — Progress Notes (Signed)
Called patient and in person appt scheduled.  [place], , 11/02/2018

## 2018-11-02 NOTE — Progress Notes (Signed)
Benefit analysis for Tdap vaccine sent to [facility]

## 2018-11-02 NOTE — Progress Notes (Signed)
Patient by [facility] called x 2 - 5 minutes apart, no answer x2.  Voicemail left for patient to call back to reschedule as we were unable to reach her for her appt.    Janann August, MD, 11/02/2018      Patient left without being seen.

## 2018-11-03 ENCOUNTER — Encounter (HOSPITAL_BASED_OUTPATIENT_CLINIC_OR_DEPARTMENT_OTHER): Payer: Self-pay | Admitting: Internal Medicine

## 2018-11-03 ENCOUNTER — Telehealth (HOSPITAL_BASED_OUTPATIENT_CLINIC_OR_DEPARTMENT_OTHER): Payer: Self-pay | Admitting: Internal Medicine

## 2018-11-03 ENCOUNTER — Ambulatory Visit: Payer: 59 | Attending: Internal Medicine | Admitting: Internal Medicine

## 2018-11-03 ENCOUNTER — Other Ambulatory Visit: Payer: Self-pay

## 2018-11-03 VITALS — BP 123/84 | HR 71 | Temp 96.0°F | Wt 198.6 lb

## 2018-11-03 DIAGNOSIS — M25561 Pain in right knee: Secondary | ICD-10-CM | POA: Diagnosis present

## 2018-11-03 DIAGNOSIS — J449 Chronic obstructive pulmonary disease, unspecified: Secondary | ICD-10-CM | POA: Diagnosis present

## 2018-11-03 MED ORDER — MELOXICAM 7.5 MG PO TABS
7.5000 mg | ORAL_TABLET | Freq: Two times a day (BID) | ORAL | 0 refills | Status: DC
Start: 2018-11-03 — End: 2019-01-12

## 2018-11-03 NOTE — Progress Notes (Unsigned)
Other issues  Memory problem    Lactose intolerance?
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
## 2018-11-03 NOTE — Progress Notes (Signed)
[facility] to complete a benefit analysis for the tdap Vaccine.    The vaccine is covered under the patients prescription coverage.    Please choose [facility]

## 2018-11-09 ENCOUNTER — Ambulatory Visit: Payer: 59 | Attending: Family Medicine | Admitting: Family Medicine

## 2018-11-09 DIAGNOSIS — F1120 Opioid dependence, uncomplicated: Secondary | ICD-10-CM | POA: Diagnosis present

## 2018-11-09 MED ORDER — BUPRENORPHINE HCL-NALOXONE HCL 8-2 MG SL FILM
ORAL_FILM | SUBLINGUAL | 0 refills | Status: DC
Start: 2018-11-09 — End: 2018-11-23

## 2018-11-09 NOTE — Progress Notes (Signed)
For the patient participating in the group via telephone and/or video technology: the patient/guardian has verbally consented to participate in this group therapy session by televisit. The patient/guardian was encouraged to be in a private location due to personal health information being discussed and where they could not be overheard by individuals not participating in the group therapy. Although all [facility] staff/providers participating in this group therapy televisit are in a private location, all the risks of telephone and/or video technology used to conduct this group therapy session cannot be controlled by [facility], (i.e. interruptions, unauthorized access/recording and technical difficulties).  Each patient understands that the visit can be stopped at any time for any reason, including if the conferencing connections are inadequate.     Cheryl Dixon is a 63 year old female seen in follow up for opioid dependence:    Buprenorphine dose: 12mg   Response, adequacy of dose: good  Relapses/close calls: none  Trigger: none  Meetings: none    Social history/events:  Living in her new place, which she really likes  Feeling good, nothing new otherwise      Present Medications:    Current Outpatient Medications on File Prior to Visit:  meloxicam (MOBIC) 7.5 MG tablet Take 1 tablet by mouth 2 (two) times daily  for 14 days Disp: 28 tablet Rfl: 0   [DISCONTINUED] buprenorphine-naloxone (SUBOXONE) 8-2 MG sublingual film Take two and one-half films daily Disp: 35 Film Rfl: 0   albuterol (VENTOLIN HFA) 108 (90 Base) MCG/ACT inhaler Inhale 2 puffs into the lungs every 6 (six) hours as needed for Wheezing or Shortness of breath Disp: 1 Inhaler Rfl: 2   INCRUSE ELLIPTA 62.5 MCG/INH inhaler INHALE 1 PUFF BY MOUTH INTO LUNGS DAILY Disp: 1 Inhaler Rfl: 11   buPROPion (WELLBUTRIN SR) 200 MG 12 hr tablet Take 1 tablet by mouth 2 (two) times daily Disp: 180 tablet Rfl: 1   ipratropium-albuterol (DUO-NEB) 0.5-2.5 (3) MG/3ML SOLN  Inhalation Solution Take 3 mLs by nebulization 4 (four) times daily. Disp: 180 mL Rfl: 0     No current facility-administered medications on file prior to visit.     PHQ-9 TOTAL SCORE 06/01/2018 03/03/2017 01/05/2017   Doc FlowSheet Total Score - - -   Doc FlowSheet Total Score 6 4 22            PHYSICAL EXAMINATION via video:  General appearance - healthy female in no distress  Eyes - pupils wnl  Neuro - nonfocal  Affect - normal  Behavior without evidence of intoxication or impairment    PMP reviewed. No unauthorized prescriptions since last refill.     Assessment:  Opioid Dependence  Comment: condition gradually improving on current dose, actively participating in group.  Plan:  Continue buprenorphine, reviewed criteria for tapering when patient is ready.    Patient is counseled regarding relapse prevention, involvement in recovery groups, potential side effects of buprenorphine, and eventual taper of medication.    We discussed the patients current medications. The patient expressed understanding and no barriers to adherence were identified.  1. The patient indicates understanding of these issues and agrees with the plan.    2. I reviewed the patient's medical information and medical history   3. I reconciled the patient's medication list and prepared and supplied needed refills.   4. I have reviewed the past medical, family, and social history sections including the medications and allergies.

## 2018-11-23 ENCOUNTER — Ambulatory Visit: Payer: 59 | Attending: Family Medicine | Admitting: Family Medicine

## 2018-11-23 DIAGNOSIS — F1120 Opioid dependence, uncomplicated: Secondary | ICD-10-CM | POA: Diagnosis present

## 2018-11-23 DIAGNOSIS — M25561 Pain in right knee: Secondary | ICD-10-CM | POA: Diagnosis present

## 2018-11-23 MED ORDER — BUPRENORPHINE HCL-NALOXONE HCL 8-2 MG SL FILM
ORAL_FILM | SUBLINGUAL | 0 refills | Status: DC
Start: 2018-11-23 — End: 2018-12-07

## 2018-11-23 NOTE — Progress Notes (Signed)
Cheryl Dixon is a 63 year old female seen in follow up for opioid dependence:    Buprenorphine dose: 20mg   Response, adequacy of dose: good  Relapses/close calls: none  Trigger: none  Meetings: good    Social history/events:    Having issues with her right knee  Saw PCP a few weeks ago, given meloxicam and referred to orthopedics  Since starting meloxicam feels like cannot urinate  Seems like most pills cause urinary issues - peeing fine, but feels like not as much as usual  Drinking plenty of water  Stopped meloxicam, slightly better  Has not yet seen ortho yet  Knee slightly swollen again - not as bad as before  Pain not constant  Ibuprofen helping a little bit    Present Medications:    Current Outpatient Medications on File Prior to Visit:  buprenorphine-naloxone (SUBOXONE) 8-2 MG sublingual film Take two and one-half films daily Disp: 35 Film Rfl: 0   meloxicam (MOBIC) 7.5 MG tablet Take 1 tablet by mouth 2 (two) times daily  for 14 days Disp: 28 tablet Rfl: 0   albuterol (VENTOLIN HFA) 108 (90 Base) MCG/ACT inhaler Inhale 2 puffs into the lungs every 6 (six) hours as needed for Wheezing or Shortness of breath Disp: 1 Inhaler Rfl: 2   INCRUSE ELLIPTA 62.5 MCG/INH inhaler INHALE 1 PUFF BY MOUTH INTO LUNGS DAILY Disp: 1 Inhaler Rfl: 11   buPROPion (WELLBUTRIN SR) 200 MG 12 hr tablet Take 1 tablet by mouth 2 (two) times daily Disp: 180 tablet Rfl: 1   ipratropium-albuterol (DUO-NEB) 0.5-2.5 (3) MG/3ML SOLN Inhalation Solution Take 3 mLs by nebulization 4 (four) times daily. Disp: 180 mL Rfl: 0     No current facility-administered medications on file prior to visit.     PHQ-9 TOTAL SCORE 06/01/2018 03/03/2017 01/05/2017   Doc FlowSheet Total Score - - -   Doc FlowSheet Total Score 6 4 22            PHYSICAL EXAMINATION via video:  General appearance - healthy female in no distress  Eyes - pupils wnl  Neuro - nonfocal  Affect - normal  Behavior without evidence of intoxication or impairment    PMP reviewed.  No unauthorized prescriptions since last refill.     Assessment:  Opioid Dependence  Comment: condition gradually improving on current dose, actively participating in group.  Plan:  Continue buprenorphine, reviewed criteria for tapering when patient is ready.    Right knee - has not yet had appointment with orthopedist, recommended continued NSAIDs, ice, walking  Call if has not received an appointment with ortho by next week    Patient is counseled regarding relapse prevention, involvement in recovery groups, potential side effects of buprenorphine, and eventual taper of medication.    For the patient participating in the group via telephone and/or video technology: the patient/guardian has verbally consented to participate in this group therapy session by televisit. The patient/guardian was encouraged to be in a private location due to personal health information being discussed and where they could not be overheard by individuals not participating in the group therapy. Although all [facility] staff/providers participating in this group therapy televisit are in a private location, all the risks of telephone and/or video technology used to conduct this group therapy session cannot be controlled by , (i.e. interruptions, unauthorized access/recording and technical difficulties).  Each patient understands that the visit can be stopped at any time for any reason, including if the conferencing connections are inadequate.  We discussed the patients current medications. The patient expressed understanding and no barriers to adherence were identified.    1. The patient indicates understanding of these issues and agrees with the plan.    2. I reviewed the patient's medical information and medical history   3. I reconciled the patient's medication list and prepared and supplied needed refills.   4. I have reviewed the past medical, family, and social history sections including the medications and allergies.

## 2018-12-07 ENCOUNTER — Ambulatory Visit: Payer: 59 | Attending: Family Medicine | Admitting: Family Medicine

## 2018-12-07 DIAGNOSIS — F1120 Opioid dependence, uncomplicated: Secondary | ICD-10-CM | POA: Insufficient documentation

## 2018-12-07 MED ORDER — BUPRENORPHINE HCL-NALOXONE HCL 8-2 MG SL FILM
ORAL_FILM | SUBLINGUAL | 0 refills | Status: DC
Start: 2018-12-07 — End: 2018-12-21

## 2018-12-07 NOTE — Progress Notes (Addendum)
Cheryl Dixon is a 63 year old female seen in follow up for opioid dependence:    Buprenorphine dose: 20mg   Response, adequacy of dose: good  Relapses/close calls: none  Trigger: none  Meetings: none    Social history/events:  Doing well!  Has new great grandchild.  Also in new apartment.    Present Medications:    Current Outpatient Medications on File Prior to Visit:  [DISCONTINUED] buprenorphine-naloxone (SUBOXONE) 8-2 MG sublingual film Take two and one-half films daily Disp: 35 Film Rfl: 0   meloxicam (MOBIC) 7.5 MG tablet Take 1 tablet by mouth 2 (two) times daily  for 14 days Disp: 28 tablet Rfl: 0   albuterol (VENTOLIN HFA) 108 (90 Base) MCG/ACT inhaler Inhale 2 puffs into the lungs every 6 (six) hours as needed for Wheezing or Shortness of breath Disp: 1 Inhaler Rfl: 2   INCRUSE ELLIPTA 62.5 MCG/INH inhaler INHALE 1 PUFF BY MOUTH INTO LUNGS DAILY Disp: 1 Inhaler Rfl: 11   buPROPion (WELLBUTRIN SR) 200 MG 12 hr tablet Take 1 tablet by mouth 2 (two) times daily Disp: 180 tablet Rfl: 1   ipratropium-albuterol (DUO-NEB) 0.5-2.5 (3) MG/3ML SOLN Inhalation Solution Take 3 mLs by nebulization 4 (four) times daily. Disp: 180 mL Rfl: 0     No current facility-administered medications on file prior to visit.     PHQ-9 TOTAL SCORE 06/01/2018 03/03/2017 01/05/2017   Doc FlowSheet Total Score - - -   Doc FlowSheet Total Score 6 4 22            PHYSICAL EXAMINATION via video:  General appearance - healthy female in no distress  Eyes - pupils nl  Neuro - nonfocal  Affect - normal  Behavior without evidence of intoxication or impairment    PMP Reviewed.  No unauthorized prescriptions since last refill.     Assessment:  Opioid Dependence  Comment: Enjoying the positive changes in life with new great grandchild and new apt.    Plan:  Continue buprenorphine, reviewed criteria for tapering when patient is ready.     Patient is counseled regarding relapse prevention, involvement in recovery groups, potential side effects of  buprenorphine, and eventual taper of medication.    For the patient participating in the group via telephone and/or video technology: the patient/guardian has verbally consented to participate in this group therapy session by televisit. The patient/guardian was encouraged to be in a private location due to personal health information being discussed and where they could not be overheard by individuals not participating in the group therapy. Although all [facility] staff/providers participating in this group therapy televisit are in a private location, all the risks of telephone and/or video technology used to conduct this group therapy session cannot be controlled by [place], (i.e. interruptions, unauthorized access/recording and technical difficulties).  Each patient understands that the visit can be stopped at any time for any reason, including if the conferencing connections are inadequate.     Geni Bers, MD, 12/07/2018

## 2018-12-21 ENCOUNTER — Ambulatory Visit: Payer: 59 | Attending: Family Medicine | Admitting: Family Medicine

## 2018-12-21 ENCOUNTER — Telehealth (HOSPITAL_BASED_OUTPATIENT_CLINIC_OR_DEPARTMENT_OTHER): Payer: Self-pay | Admitting: Registered Nurse

## 2018-12-21 DIAGNOSIS — F1120 Opioid dependence, uncomplicated: Secondary | ICD-10-CM | POA: Diagnosis not present

## 2018-12-21 MED ORDER — NITROFURANTOIN MONOHYD MACRO 100 MG PO CAPS
100.00 mg | ORAL_CAPSULE | Freq: Two times a day (BID) | ORAL | 0 refills | Status: AC
Start: 2018-12-21 — End: 2018-12-24

## 2018-12-21 MED ORDER — BUPRENORPHINE HCL-NALOXONE HCL 8-2 MG SL FILM
ORAL_FILM | SUBLINGUAL | 0 refills | Status: DC
Start: 2018-12-21 — End: 2019-01-04

## 2018-12-21 NOTE — Progress Notes (Signed)
Pt calling reporting lower abdominal cramping- center of stomach- and reports feels like it is "hard" to urinate.  Reports she is a little confused- I asked her what she meant by that- pt reports she is a little more forgetful. Denies and LOC- no temp- no odor- no pain on urination-  Reports increased urgency- then harder to urinate.   Left flank soreness-  Reports when she has had uti in past this is how she presents  Informed her she could call in am to book televisit    Informed increase fluids- tylenol/motrin for comfort.   If worsening s/s could seek UC or ED-pt verbalized understanding.

## 2018-12-21 NOTE — Telephone Encounter (Signed)
-----   Message from [place] sent at 12/21/2018  2:06 PM EDT -----  [name] [id number], 63 year old, female    Calls today:  Clinical Questions ([place])    Name of person calling patient  Specific nature of request patient would like to talk to a nurse, she think she have UTI, please call back  Return phone number [phone]  Person calling on behalf of patient: Patient (self)        Patient's language of care: English    Patient does not need an interpreter.    Patient's PCP: [name], MD

## 2018-12-21 NOTE — Progress Notes (Signed)
Left vm for pt to call rcc back.

## 2018-12-21 NOTE — Telephone Encounter (Signed)
Regarding: Returning Call   ----- Message from Valentina Gu sent at 12/21/2018  3:01 PM EDT -----  [name] [id number], 63 year old, female    Calls today:  Sick    What are the symptoms patient is returning call. Thank you   Person calling on behalf of patient: Patient (self)    CALL BACK NUMBER: [phone]    Patient's language of care: English    Patient does not need an interpreter.    Patient's PCP: [name], MD

## 2018-12-21 NOTE — Progress Notes (Signed)
Cheryl Dixon is a 63 year old female seen in follow up for opioid dependence:    Buprenorphine dose: 20mg   Response, adequacy of dose: good  Relapses/close calls: none  Trigger: none  Meetings: none    Social history/events:  Settling in to her new apartment, doing well.    Present Medications:    Current Outpatient Medications on File Prior to Visit:  [DISCONTINUED] buprenorphine-naloxone (SUBOXONE) 8-2 MG sublingual film Take two and one-half films daily Disp: 35 Film Rfl: 0   meloxicam (MOBIC) 7.5 MG tablet Take 1 tablet by mouth 2 (two) times daily  for 14 days Disp: 28 tablet Rfl: 0   albuterol (VENTOLIN HFA) 108 (90 Base) MCG/ACT inhaler Inhale 2 puffs into the lungs every 6 (six) hours as needed for Wheezing or Shortness of breath Disp: 1 Inhaler Rfl: 2   INCRUSE ELLIPTA 62.5 MCG/INH inhaler INHALE 1 PUFF BY MOUTH INTO LUNGS DAILY Disp: 1 Inhaler Rfl: 11   buPROPion (WELLBUTRIN SR) 200 MG 12 hr tablet Take 1 tablet by mouth 2 (two) times daily Disp: 180 tablet Rfl: 1   ipratropium-albuterol (DUO-NEB) 0.5-2.5 (3) MG/3ML SOLN Inhalation Solution Take 3 mLs by nebulization 4 (four) times daily. Disp: 180 mL Rfl: 0     No current facility-administered medications on file prior to visit.     PHQ-9 TOTAL SCORE 06/01/2018 03/03/2017 01/05/2017   Doc FlowSheet Total Score - - -   Doc FlowSheet Total Score 6 4 22            PHYSICAL EXAMINATION via video:  General appearance - healthy female in no distress  Neuro - nonfocal  Affect - normal  Behavior without evidence of intoxication or impairment    PMP Reviewed.  No unauthorized prescriptions since last refill.     Assessment:  Opioid Dependence  Comment: Doing well, without concerns for relapse.  Plan:  Continue buprenorphine, reviewed criteria for tapering when patient is ready.     Patient is counseled regarding relapse prevention, involvement in recovery groups, potential side effects of buprenorphine, and eventual taper of medication.    For the patient participating  in the group via telephone and/or video technology: the patient/guardian has verbally consented to participate in this group therapy session by televisit. The patient/guardian was encouraged to be in a private location due to personal health information being discussed and where they could not be overheard by individuals not participating in the group therapy. Although all [facility] staff/providers participating in this group therapy televisit are in a private location, all the risks of telephone and/or video technology used to conduct this group therapy session cannot be controlled by , (i.e. interruptions, unauthorized access/recording and technical difficulties).  Each patient understands that the visit can be stopped at any time for any reason, including if the conferencing connections are inadequate.     Geni Bers, MD, 12/21/2018

## 2018-12-21 NOTE — Progress Notes (Signed)
Please let patient know that I sent in macrobid for UTI  If not better in 3 days let us know    Please also to check to see if she is constipated.  That could be contributing to these symptoms too.

## 2018-12-21 NOTE — Progress Notes (Signed)
Informed pt of provider message  Verbalized understanding- will call if s/s don't resolve- or if gets worse

## 2018-12-27 ENCOUNTER — Other Ambulatory Visit: Payer: Self-pay

## 2018-12-27 ENCOUNTER — Encounter (HOSPITAL_BASED_OUTPATIENT_CLINIC_OR_DEPARTMENT_OTHER): Payer: Self-pay

## 2018-12-27 ENCOUNTER — Ambulatory Visit: Payer: 59 | Attending: Internal Medicine | Admitting: Family Medicine

## 2018-12-27 ENCOUNTER — Telehealth (HOSPITAL_BASED_OUTPATIENT_CLINIC_OR_DEPARTMENT_OTHER): Payer: Self-pay

## 2018-12-27 ENCOUNTER — Telehealth (HOSPITAL_BASED_OUTPATIENT_CLINIC_OR_DEPARTMENT_OTHER): Payer: Self-pay | Admitting: Registered Nurse

## 2018-12-27 DIAGNOSIS — N390 Urinary tract infection, site not specified: Secondary | ICD-10-CM | POA: Diagnosis present

## 2018-12-27 MED ORDER — CIPROFLOXACIN HCL 500 MG PO TABS
500.00 mg | ORAL_TABLET | Freq: Two times a day (BID) | ORAL | 0 refills | Status: AC
Start: 2018-12-27 — End: 2019-01-03

## 2018-12-27 NOTE — Telephone Encounter (Signed)
-----   Message from Julaine Hua, MD sent at 12/27/2018  2:12 PM EDT -----  Regarding: labs and VS appt at [facility] please (positive covid screen)  Please schedule for lab and/or vitals visit as detailed below.    Screening for possible COVID:    Does the patient have cough, shortness of breath, or runny nose, including chronic conditions? No  In the past 14 days, has the patient:  a. had new fever, cough, shortness of breath, body aches, runny nose, sore throat, nausea, diarrhea, red eye, or lack of smell/taste? OR  Yes  - fever, nausea  b. has the patient had COVID or suspected COVID?  No      Labs ordered: Yes  Non-urgent vitals needed: Pulse and Blood pressure, Temperature  Specific timeframe, otherwise considered non-urgent:24 hours  Chart to be ccd to the following provider for vitals review: Julaine Hua, MD     Copy/paste the above and:    If labs and vitals required:    [facility]: Route to P [facility] LAB ORDERS POOL  [place]: Route to P [place] LAB ORDERS POOL    If labs only required:    [facility]: Route to P [facility] FRONT END  [facility]: Route to P [facility] FRONT END  [facility]: Route to P [facility] LAB ORDERS POOL  [place]: Route to P [facility] [place] POOL  [employer]/[place] labs: Give patient number to call to schedule -- [phone]    If symptoms of COVID or COVID within the past 14 days (yes to questions 1 or 2):    [facility]: Route to [facility] Lab Requests     Thanks,  Judie Petit

## 2018-12-27 NOTE — Progress Notes (Signed)
Per request I called the patient and she doesn't have a ride. Patient will call back as soon she has a way to get there.

## 2018-12-27 NOTE — Progress Notes (Signed)
Phone: [phone]  DOB: 1955/12/29   [id number]   Start time: 1:52 PM    End time:  2:08 PM     An interpreter was used during this encounter:  No  English    SUBJECTIVE:    Cheryl Dixon is a 63 year old female. The Patient calls today with the following concerns:    Mild back strain feeling last week.  Now pain in left low back, crampy. Went away on antibiotics, now back.   Also in left lower abdomen.   Took antibiotics x 3 days. Nitrofurantoin.  Recent change in brand of suboxone.   Took last pill 2 days ago, then last night symptoms started again. No dysuria. Has uncomfortable feeling. Urinary frequency, but small volume urine. No vaginal discharge. No hematuria.   Last night and last week felt feverish with chills. Took naproxen 200 mg.   Drinking a lot of fluids, cranberry juice.   No vomiting or diarrrhea. Mild nausea. No fever today.     Screening for possible COVID:    1. In the past 14 days,   a. has the patient had ANY (including chronic) cough, shortness of breath, or runny nose   b. or new fever, body aches, sore throat, nausea, diarrhea, red eye, or lack of smell/taste? Yes - fever, nausea  2. In the past 14 days, has the patient had COVID or suspected COVID?  No        Patient Active Problem List:     Opioid dependence on agonist therapy (HCC)     Tobacco use disorder     Fibrocystic breast     Elevated BP     Knee pain     Chronic low back pain     OA (osteoarthritis) of knee     H. pylori infection     Major depressive disorder, recurrent episode, severe (HCC)     Occult blood in stools     Prediabetes     Epigastric pain     COPD with exacerbation (HCC)     Chronic obstructive pulmonary disease (HCC)     Left foot pain     Adenomatous polyp of colon     Acquired deformity of toe, right     Dislocation of metatarsophalangeal joint of lesser toe, right, sequela      Current Outpatient Medications on File Prior to Visit:  buprenorphine-naloxone (SUBOXONE) 8-2 MG sublingual film Take two and one-half  films daily Disp: 35 Film Rfl: 0   meloxicam (MOBIC) 7.5 MG tablet Take 1 tablet by mouth 2 (two) times daily  for 14 days Disp: 28 tablet Rfl: 0   albuterol (VENTOLIN HFA) 108 (90 Base) MCG/ACT inhaler Inhale 2 puffs into the lungs every 6 (six) hours as needed for Wheezing or Shortness of breath Disp: 1 Inhaler Rfl: 2   INCRUSE ELLIPTA 62.5 MCG/INH inhaler INHALE 1 PUFF BY MOUTH INTO LUNGS DAILY Disp: 1 Inhaler Rfl: 11   buPROPion (WELLBUTRIN SR) 200 MG 12 hr tablet Take 1 tablet by mouth 2 (two) times daily Disp: 180 tablet Rfl: 1   ipratropium-albuterol (DUO-NEB) 0.5-2.5 (3) MG/3ML SOLN Inhalation Solution Take 3 mLs by nebulization 4 (four) times daily. Disp: 180 mL Rfl: 0     No current facility-administered medications on file prior to visit.   All medications reviewed with patient.  Review of Patient's Allergies indicates:   Darvon  Meperidine hcl              Comment:Nausea/vomit   Paxil [paroxetine]      Rash   Zoloft [sertraline *    Rash  Social History    Tobacco Use      Smoking status: Current Some Day Smoker        Packs/day: 1.00        Years: 20.00        Pack years: 20        Types: Cigarettes      Smokeless tobacco: Never Used      Tobacco comment: quit 1980-1996, then light until 2003, then 1 ppd since    Alcohol use: No    Drug use: Yes      Types: Marijuana      Comment: past opioids, nasal heroin, 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  MJ 1x per week    Medical/Surgical/Family History reviewed with the patient.  Relevant changes have been made in 'history' section.    Recent laboratories and imaging reviewed.    OBJECTIVE:    Exam:  General: NAD  HEENT: Voice with normal tone, not raspy or hoarse  Respiratory: No respiratory distress, speaking in full sentences  Neurologic: Alert and oriented  Psychiatric:  Mood and behavior normal with no anxiety or depression       ASSESSMENT AND PLAN:    1. Recurrent UTI  Had 3 day course nitrofurantoin, now with frequency, possible flank pain, subjective  fever yesterday.   - ciprofloxacin (CIPRO) 500 MG tablet; Take 1 tablet by mouth 2 (two) times daily  for 7 days  Dispense: 14 tablet; Refill: 0  - URINE CULTURE; Future   Will check urine and treat.  Will check VS as she is not sure if she had a fever, has no thermometer.   Reasons to call or return to clinic were discussed. If she can't get to [facility] for labs, will go to [facility] or ED.     See below for additional instructions provided to the patient with the After Visit Summary.    I explained the diagnosis and treatment plan, and the patient/parent/guardian expressed understanding of the content. We discussed all medicines prescribed and the importance of medication adherence. The patient/parent/guardian expressed understanding and no barriers to adherence were identified.  Possible side effects of the prescribed medication(s) were explained.  I attempted to answer all questions regarding the diagnosis and the proposed treatment.    We discussed the patients current medications including proper use and potential side effects. The patient expressed understanding and no barriers to adherence were identified.     1. The patient indicates understanding of these issues and agrees with the plan. Brief care plan is updated and reviewed with the patient.   2. The patient is given the option to have the After Visit Summary sent via MyChart that lists all medications with directions, allergies, orders placed during this encounter, and follow-up instructions.   3. I reviewed the patient's medical information and medical history   4. I reconciled the patient's medication list and prepared and supplied needed refills.   5. I have reviewed the past medical, family, and social history sections including the medications and allergies.    Julaine Hua, MD

## 2018-12-27 NOTE — Progress Notes (Signed)
Pt calling reporting UTI symptoms back despite finishing treatment  Pt reports felt good for day and 1/2 after she stopped abx-    Now pt has urinary frequency- then feels like it is hard to get urine out-  Cloudy urine- no odor- no temp  Does had L sided low abdominal pain and L flank pain.   Denies any vaginal discharge- does "have a weird feeling."    Pt has had hysterectomy- removal of gallbladder and appendix in past    Pt reports she completed the abx and flushed herself with fluid this weekend-

## 2018-12-27 NOTE — Telephone Encounter (Signed)
-----   Message from Kern Alberta sent at 12/27/2018 12:34 PM EDT -----  Cheryl Dixon [id number], 63 year old, female    Calls today:  Clinical Questions ([place])    Name of person calling wonda downard  Specific nature of request patient would like a call back regarding an UTI that will not go away  Return phone number   Person calling on behalf of patient: Patient (self)      Patient's language of care: English    Patient does not need an interpreter.    Patient's PCP: [name], MD

## 2019-01-04 ENCOUNTER — Ambulatory Visit: Payer: 59 | Attending: Family Medicine | Admitting: Family Medicine

## 2019-01-04 DIAGNOSIS — F1120 Opioid dependence, uncomplicated: Secondary | ICD-10-CM | POA: Insufficient documentation

## 2019-01-04 MED ORDER — BUPRENORPHINE HCL-NALOXONE HCL 8-2 MG SL FILM
ORAL_FILM | SUBLINGUAL | 0 refills | Status: DC
Start: 2019-01-04 — End: 2019-01-12

## 2019-01-04 NOTE — Progress Notes (Signed)
Cheryl Dixon is a 63 year old female seen in follow up for opioid dependence:    Buprenorphine dose: 20mg   Response, adequacy of dose: good  Relapses/close calls: none  Trigger: none  Meetings: none    Social history/events:  Having difficulty in the constant brand changes with suboxone, since it changes her response and side effects.  Plans to talk with a case worker about this to see if she can get the same one covered.      Present Medications:    Current Outpatient Medications on File Prior to Visit:  [EXPIRED] ciprofloxacin (CIPRO) 500 MG tablet Take 1 tablet by mouth 2 (two) times daily  for 7 days Disp: 14 tablet Rfl: 0   [DISCONTINUED] buprenorphine-naloxone (SUBOXONE) 8-2 MG sublingual film Take two and one-half films daily Disp: 35 Film Rfl: 0   meloxicam (MOBIC) 7.5 MG tablet Take 1 tablet by mouth 2 (two) times daily  for 14 days Disp: 28 tablet Rfl: 0   albuterol (VENTOLIN HFA) 108 (90 Base) MCG/ACT inhaler Inhale 2 puffs into the lungs every 6 (six) hours as needed for Wheezing or Shortness of breath Disp: 1 Inhaler Rfl: 2   INCRUSE ELLIPTA 62.5 MCG/INH inhaler INHALE 1 PUFF BY MOUTH INTO LUNGS DAILY Disp: 1 Inhaler Rfl: 11   buPROPion (WELLBUTRIN SR) 200 MG 12 hr tablet Take 1 tablet by mouth 2 (two) times daily Disp: 180 tablet Rfl: 1   ipratropium-albuterol (DUO-NEB) 0.5-2.5 (3) MG/3ML SOLN Inhalation Solution Take 3 mLs by nebulization 4 (four) times daily. Disp: 180 mL Rfl: 0     No current facility-administered medications on file prior to visit.     PHQ-9 TOTAL SCORE 06/01/2018 03/03/2017 01/05/2017   Doc FlowSheet Total Score - - -   Doc FlowSheet Total Score 6 4 22            PHYSICAL EXAMINATION:  Examination deferred because of COVID-19 pandemic  Alert  No distress  Speaking in full sentences  Speech normal without evidence of intoxication or impairment    PMP Reviewed.  No unauthorized prescriptions since last refill.     Assessment:  Opioid Dependence  Comment: Doing well with recovery, though  bothered by formulary change on the brand of suboxone.  She knows she can reach out to me if there is something I should write on the Rx to help get her a consistent form.  Plan:  Continue buprenorphine, reviewed criteria for tapering when patient is ready.   Patient is counseled regarding relapse prevention, involvement in recovery groups, potential side effects of buprenorphine, and eventual taper of medication.    Geni Bers, MD, 01/04/2019

## 2019-01-05 ENCOUNTER — Other Ambulatory Visit: Payer: Self-pay

## 2019-01-05 ENCOUNTER — Telehealth (HOSPITAL_BASED_OUTPATIENT_CLINIC_OR_DEPARTMENT_OTHER): Payer: Self-pay | Admitting: Registered Nurse

## 2019-01-05 NOTE — Telephone Encounter (Signed)
-----   Message from Corliss Marcus sent at 01/05/2019 10:50 AM EDT -----  Regarding: Call Back   Contact: [phone]  Lean Loosli [id number], 63 year old, female    Calls today:  Clinical Questions ([place])    Name of person calling   Specific nature of request a call back regard to blood work and antibiotics. Please advise    Return phone number [phone]  Person calling on behalf of patient: Patient (self)    CALL BACK NUMBER:   Best time to call back:   Cell phone:   Other phone:    Patient's language of care: English    Patient does not need an interpreter.    Patient's PCP: [name], MD

## 2019-01-05 NOTE — Progress Notes (Signed)
Pt calling c/o left lower abdominal and flank pain  Reports has been treated for uti 2x with abx with no relief  Pt reports always feeling like she needs to urinate- then doesn't pee much  Reports feeling like she urinates much more at night  Reports abdominal "pressure"  Pt is drinking and eating ok  Moving bowels fine  Reports sweating often "for the last 20 years" reports she sometimes thinks when pharm gives her a generic for suboxone she feels more sweaty and suffers from aches  Advised if worsening-temp-hematuria seek UC or ED.

## 2019-01-06 ENCOUNTER — Encounter (HOSPITAL_BASED_OUTPATIENT_CLINIC_OR_DEPARTMENT_OTHER): Payer: Self-pay | Admitting: Family Medicine

## 2019-01-06 ENCOUNTER — Ambulatory Visit: Payer: 59 | Attending: Family Medicine | Admitting: Family Medicine

## 2019-01-06 DIAGNOSIS — N390 Urinary tract infection, site not specified: Secondary | ICD-10-CM | POA: Insufficient documentation

## 2019-01-06 NOTE — Progress Notes (Signed)
[facility]  Tele Visit Note     Subjective    Cheryl Dixon is a 63 year old female   Patient presents with:  UTI    Suboxone with [place]. She is concerned that the formulation of medication are effecting her treatment. She is increasingly anxious that her the changes in formulation represent different medication concentrations. At times she feels like the dose is too high causing her to feel a slight high. At times she feels like she is with withdrawing with increased anxiety. This has been happening over the past two years.     On 8/25 called regarding flank discomfort, pressure to urinate. Rx nitrofurantion. F/u on 8/31 patient with return of symptoms without dysuria. Rx for cipro for 1 week. She completed those a few days ago and now she has a return of her discomfort.  Abdominal cramping and pressure to urinate.   Pain on the left side. abx for 3 d. Urinating. Finished on Saturday. Started it again on Monday. Gave her cipro. Still getting the pain. Pain on the side, chills, sweat.    Had pressure. Urinary frequency. Urinary hesitancy.    Different medications for suboxone.  Finished Abx On Tuesday.    Pain with hitting her left flank.  No fevers, chills, myalgias. No nausea/vomiting. Able to PO well. +Anorexia.    I have reviewed the past medical, surgical, social and family history.           Assessment & Plan:   Cheryl Dixon is a 63 year old female with   Diagnosis ICD-9-CM ICD-10-CM Plan   1. Recurrent UTI 599.0 N39.0 COMPREHENSIVE METABOLIC PANEL      CBC, PLATELET & DIFFERENTIAL      BLOOD CULTURE ADULT      URINALYSIS RFLX TO URINE CULT     Explained to patient that availability of suboxone formulation is not within our control. I will discuss with her PCP if there are ways to control the formulation or change to pill.    1. Recurrent UTI  If possible I would like the patient to have labs to help determine whether she is acutely suffering from a UTI. I have asked her to [facility] to the [facility] but she prefers to come to the clinic. I will message the medical assistants to assist in scheduling a laboratory visit.  - COMPREHENSIVE METABOLIC PANEL  - CBC, PLATELET & DIFFERENTIAL  - BLOOD CULTURE ADULT  - URINALYSIS RFLX TO URINE CULT      Screening for possible COVID:    1. Does the patient have cough, shortness of breath, or runny nose, including chronic conditions? No  2. In the past 14 days, has the patient:  a. had new fever, cough, shortness of breath, body aches, runny nose, sore throat, nausea, diarrhea, or lack of smell/taste? OR  No   b. has the patient had COVID or suspected COVID?  No    MDM:  Counseling: Discussed with [name] regarding warning signs, emergency numbers, and when to seek immediate medical attention. Shaquanta Kujala verbalized understanding and appreciated care.   All interim labs, test results, and consult notes were reviewed and discussed with [name].   Coordination of Care:  Time: 25 Minutes were spent during this encounter of which 50% were spent on counseling and care coordination.    Cheryl Darleen Moffitt, MD, 01/06/2019

## 2019-01-10 ENCOUNTER — Other Ambulatory Visit: Payer: Self-pay

## 2019-01-10 ENCOUNTER — Telehealth (HOSPITAL_BASED_OUTPATIENT_CLINIC_OR_DEPARTMENT_OTHER): Payer: Self-pay

## 2019-01-10 ENCOUNTER — Ambulatory Visit (HOSPITAL_BASED_OUTPATIENT_CLINIC_OR_DEPARTMENT_OTHER): Payer: Self-pay | Admitting: Registered Nurse

## 2019-01-10 NOTE — Telephone Encounter (Signed)
Screening for possible COVID:    1. Does the patient have cough, shortness of breath, or runny nose, including chronic conditions? No  2. In the past 14 days, has the patient:  a. had new fever, cough, shortness of breath, body aches, runny nose, sore throat, nausea, diarrhea, or lack of smell/taste? OR  No   b. has the patient had COVID or suspected COVID?  No

## 2019-01-10 NOTE — Telephone Encounter (Signed)
Regarding: uti  ----- Message from Quintin Alto sent at 01/10/2019  9:30 AM EDT -----  Cheryl Dixon [id number], 63 year old, female    Calls today:  Sick    What are the symptoms UTI - pt declined televisit, states that she would like to speak with Nevin Bloodgood RN regarding antibiotics.   Person calling on behalf of patient: Patient (self)    Patient's language of care: English    Patient does not need an interpreter.    Patient's PCP: [name], MD

## 2019-01-10 NOTE — Telephone Encounter (Signed)
Pt calling reporting continues urinary urgency and frequency  Reports left flank and left abdominal pain  Not stabbing- reports she wonders is suboxone has been masking pain- wondering if it has been going on "for a while and I didn't know it."  Pt has completed all abx-  Pt hasn't had labs done- reports she needs a few days to make appt as she needs to schedule ride.

## 2019-01-11 ENCOUNTER — Telehealth (HOSPITAL_BASED_OUTPATIENT_CLINIC_OR_DEPARTMENT_OTHER): Payer: Self-pay

## 2019-01-11 NOTE — Progress Notes (Signed)
Left voice message, unable to screen.

## 2019-01-12 ENCOUNTER — Ambulatory Visit: Payer: 59 | Attending: Internal Medicine | Admitting: Internal Medicine

## 2019-01-12 ENCOUNTER — Encounter (HOSPITAL_BASED_OUTPATIENT_CLINIC_OR_DEPARTMENT_OTHER): Payer: Self-pay | Admitting: Internal Medicine

## 2019-01-12 ENCOUNTER — Telehealth (HOSPITAL_BASED_OUTPATIENT_CLINIC_OR_DEPARTMENT_OTHER): Payer: Self-pay | Admitting: Internal Medicine

## 2019-01-12 ENCOUNTER — Other Ambulatory Visit: Payer: Self-pay

## 2019-01-12 VITALS — BP 126/85 | HR 76 | Temp 96.7°F | Wt 193.0 lb

## 2019-01-12 DIAGNOSIS — Z23 Encounter for immunization: Secondary | ICD-10-CM | POA: Diagnosis present

## 2019-01-12 DIAGNOSIS — R109 Unspecified abdominal pain: Secondary | ICD-10-CM | POA: Insufficient documentation

## 2019-01-12 DIAGNOSIS — J441 Chronic obstructive pulmonary disease with (acute) exacerbation: Secondary | ICD-10-CM | POA: Diagnosis present

## 2019-01-12 DIAGNOSIS — F1120 Opioid dependence, uncomplicated: Secondary | ICD-10-CM | POA: Diagnosis present

## 2019-01-12 DIAGNOSIS — R350 Frequency of micturition: Secondary | ICD-10-CM | POA: Insufficient documentation

## 2019-01-12 DIAGNOSIS — K5903 Drug induced constipation: Secondary | ICD-10-CM | POA: Insufficient documentation

## 2019-01-12 DIAGNOSIS — R10A2 Flank pain, left side: Secondary | ICD-10-CM

## 2019-01-12 LAB — OXYCODONE SCREEN URINE
OXYCOD SCRN URINE: NEGATIVE
OXYCOD UR SPEC GRAV: 1.03 (ref 1.003–1.035)
OXYCOD URINE CREAT: 478 md/dL
OXYCOD URINE PH: 6 (ref 4.5–8.5)

## 2019-01-12 LAB — CBC WITH PLATELET
ABSOLUTE NRBC COUNT: 0 10*3/uL (ref 0.0–0.0)
HEMATOCRIT: 41.4 % (ref 34.1–44.9)
HEMOGLOBIN: 12.7 g/dL (ref 11.2–15.7)
MEAN CORP HGB CONC: 30.7 g/dL — ABNORMAL LOW (ref 31.0–37.0)
MEAN CORPUSCULAR HGB: 28.7 pg (ref 26.0–34.0)
MEAN CORPUSCULAR VOL: 93.7 fl (ref 80.0–100.0)
MEAN PLATELET VOLUME: 10.5 fL (ref 8.7–12.5)
NRBC %: 0 % (ref 0.0–0.0)
PLATELET COUNT: 272 10*3/uL (ref 150–400)
RBC DISTRIBUTION WIDTH STD DEV: 45 fL (ref 35.1–46.3)
RED BLOOD CELL COUNT: 4.42 M/uL (ref 3.90–5.20)
WHITE BLOOD CELL COUNT: 5.6 10*3/uL (ref 4.0–11.0)

## 2019-01-12 LAB — POC URINALYSIS
GLUCOSE,URINE: NEGATIVE
LEUKOCYTE ESTERASE: NEGATIVE
NITRITE, URINE: NEGATIVE
PH URINE: 6 (ref 5.0–8.0)
SPECIFIC GRAVITY, URINE: >=1.03 (ref 1.003–1.030)
UROBILINOGEN URINE: 0.2 (ref 0.2–1.0)

## 2019-01-12 LAB — COMPREHENSIVE METABOLIC PANEL
ALANINE AMINOTRANSFERASE: 14 U/L (ref 12–45)
ALBUMIN: 3.8 g/dL (ref 3.4–5.0)
ALKALINE PHOSPHATASE: 64 U/L (ref 45–117)
ANION GAP: 8 mmol/L (ref 5–15)
ASPARTATE AMINOTRANSFERASE: 20 U/L (ref 8–34)
BILIRUBIN TOTAL: 0.4 mg/dL (ref 0.2–1.0)
BUN (UREA NITROGEN): 17 mg/dL (ref 7–18)
CALCIUM: 8.8 mg/dL (ref 8.5–10.1)
CARBON DIOXIDE: 24 mmol/L (ref 21–32)
CHLORIDE: 109 mmol/L — ABNORMAL HIGH (ref 98–107)
CREATININE: 1.1 mg/dL (ref 0.4–1.2)
ESTIMATED GLOMERULAR FILT RATE: 50 mL/min — ABNORMAL LOW (ref >60)
Glucose Random: 82 mg/dL (ref 74–160)
POTASSIUM: 4.7 mmol/L (ref 3.5–5.1)
SODIUM: 141 mmol/L (ref 136–145)
TOTAL PROTEIN: 6.8 g/dL (ref 6.4–8.2)

## 2019-01-12 LAB — METHADONE URINE: METHADONE URINE: NEGATIVE

## 2019-01-12 LAB — AMPHETAMINES URINE: AMPHETAMINES URINE: NEGATIVE

## 2019-01-12 LAB — BUPRENORPHINE SCREEN URINE: BUPRENORPHINE SCREEN URINE: POSITIVE

## 2019-01-12 LAB — COCAINE METABOLITES URINE: COCAINE METABOLITES URINE: NEGATIVE

## 2019-01-12 LAB — THYROID SCREEN TSH REFLEX FT4: THYROID SCREEN TSH REFLEX FT4: 1.63 u[IU]/mL (ref 0.358–3.740)

## 2019-01-12 LAB — BENZODIAZEPINES URINE: BENZODIAZEPINES URINE: NEGATIVE

## 2019-01-12 LAB — CANNABINOIDS URINE: CANNABINOIDS URINE: POSITIVE — AB

## 2019-01-12 LAB — ETHANOL URINE: ETHANOL URINE: NEGATIVE

## 2019-01-12 LAB — OPIATES URINE: OPIATES URINE: NEGATIVE

## 2019-01-12 MED ORDER — BUPRENORPHINE HCL-NALOXONE HCL 8-2 MG SL SUBL
SUBLINGUAL_TABLET | SUBLINGUAL | 0 refills | Status: DC
Start: 2019-01-12 — End: 2019-01-13

## 2019-01-12 NOTE — Progress Notes (Signed)
Prior authorization request for Suboxione 8 mg/2 mg tab -- two and one half per day  Clinical Indication: opioid dependence in remission  Previous medications tried:   Buprenorphine/naloxone generic film 8 mg/2 mg highly variable responses - stomach and leg cramps, regurgitation, sweats, anxiety, palpitations, oversedation, depending on which generic she recieves, requiring adjustment of dosing from 1 film to 2.5 film per day depending on which type of generic.  Symptoms have been reproducible on several trials.  (Note: Suboxone brand name film 8 mg/2 mg - two and one half film per day would be an acceptable alternative if Suboxone brand name tabs not approved.)
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
## 2019-01-12 NOTE — Progress Notes (Signed)
Prior authorization request for suboxone brand name tabs       Patient has [employer] for prescription coverage.  I.D.# [id number]  BIN# [id number]  PCN: [id number]   GROUP:  [id number]    Phone number: [phone]       Brand name Suboxone Films ARE covered without a PA, would you still like to proceed with the PA

## 2019-01-12 NOTE — Progress Notes (Signed)
Please send letter with test results and this comment:    All labs are within acceptable limits.

## 2019-01-12 NOTE — Progress Notes (Signed)
VIS given prior to administration and reviewed with the patient and or legal guardian. Patient understands the disease and the vaccine. See immunization/Injection module or chart review for date of publication and additional information.    Influenza Vaccine Procedure  January 12, 2019    1. Has the patient received the information for the influenza vaccine? yes    2. Does the patient have any of the following contraindications?  Allergy to eggs? No  Allergic reaction to previous influenza vaccines? No  Any other problems to previous influenza vaccines? No  Paralyzed by Guillain-Barre syndrome?  No  Currently pregnant? No  Current moderate or severe illness? No  Allergy to contact lens solution? No    3. The vaccine has been administered in the usual fashion and the patient/guardian was instructed to wait 20 minutes before leaving the building in the event of an allergic reaction:     Immunization information and current VIS for flu vaccine(s) reviewed; verbal consent given by patient/guardian.

## 2019-01-13 LAB — HEMOGLOBIN A1C
ESTIMATED AVERAGE GLUCOSE: 85 (ref 74–160)
HEMOGLOBIN A1C: 4.6 % (ref 4.0–5.6)

## 2019-01-13 MED ORDER — SUBOXONE 8-2 MG SL FILM
ORAL_FILM | SUBLINGUAL | 0 refills | Status: DC
Start: 2019-01-18 — End: 2019-01-18

## 2019-01-13 NOTE — Progress Notes (Signed)
I left VM for patient.  Let's try brand name film first which will at least be a consistent dose.  If that doesn't work we'll do PA for brand-name tabs

## 2019-01-18 ENCOUNTER — Other Ambulatory Visit (HOSPITAL_BASED_OUTPATIENT_CLINIC_OR_DEPARTMENT_OTHER): Payer: Self-pay | Admitting: Internal Medicine

## 2019-01-18 ENCOUNTER — Telehealth (HOSPITAL_BASED_OUTPATIENT_CLINIC_OR_DEPARTMENT_OTHER): Payer: Self-pay | Admitting: Internal Medicine

## 2019-01-18 ENCOUNTER — Ambulatory Visit: Payer: 59 | Attending: Family Medicine | Admitting: Family Medicine

## 2019-01-18 DIAGNOSIS — F1120 Opioid dependence, uncomplicated: Secondary | ICD-10-CM | POA: Diagnosis not present

## 2019-01-18 MED ORDER — SUBOXONE 8-2 MG SL FILM
ORAL_FILM | SUBLINGUAL | 0 refills | Status: DC
Start: 2019-01-18 — End: 2019-02-15

## 2019-01-18 MED ORDER — BUPRENORPHINE HCL-NALOXONE HCL 8-2 MG SL FILM: ORAL_FILM | SUBLINGUAL | 0 refills | Status: DC

## 2019-01-18 NOTE — Progress Notes (Signed)
Cheryl Dixon is a 63 year old female seen in follow up for opioid dependence:    Buprenorphine dose: 20mg   Response, adequacy of dose: okay, but complicated by differences in symptoms depending on brand type  Relapses/close calls: none  Trigger: none  Meetings: none    Social history/events:  Struggling with side effects from different suboxone brands.  She discussed this with PCP, Dr. Lisa Roca, and he sent in new Rx requesting brand name only. She will see if this helps.    Present Medications:    Current Outpatient Medications on File Prior to Visit:  SUBOXONE 8-2 MG sublingual film Take two and one-half films under the tongue daily.  Brand name only, no substitution Disp: 35 Film Rfl: 0   albuterol (VENTOLIN HFA) 108 (90 Base) MCG/ACT inhaler Inhale 2 puffs into the lungs every 6 (six) hours as needed for Wheezing or Shortness of breath Disp: 1 Inhaler Rfl: 2   INCRUSE ELLIPTA 62.5 MCG/INH inhaler INHALE 1 PUFF BY MOUTH INTO LUNGS DAILY Disp: 1 Inhaler Rfl: 11   buPROPion (WELLBUTRIN SR) 200 MG 12 hr tablet Take 1 tablet by mouth 2 (two) times daily Disp: 180 tablet Rfl: 1   ipratropium-albuterol (DUO-NEB) 0.5-2.5 (3) MG/3ML SOLN Inhalation Solution Take 3 mLs by nebulization 4 (four) times daily. Disp: 180 mL Rfl: 0     No current facility-administered medications on file prior to visit.     PHQ-9 TOTAL SCORE 01/12/2019 06/01/2018 03/03/2017   Doc FlowSheet Total Score - - -   Doc FlowSheet Total Score 10 6 4            PHYSICAL EXAMINATION via video:  General appearance - healthy female in no distress  Neuro - nonfocal  Affect - normal  Behavior without evidence of intoxication or impairment    PMP Reviewed.  No unauthorized prescriptions since last refill.     Assessment:  Opioid Dependence  Comment: Pt already has refill from Dr. Lisa Roca requesting brand name suboxone.  Empathized with her frustration regarding side effects.  Plan:  Pt to try brand name suboxone and can titrate dose for this formulation.  Continue  buprenorphine, reviewed criteria for tapering when patient is ready.     Patient is counseled regarding relapse prevention, involvement in recovery groups, potential side effects of buprenorphine, and eventual taper of medication.    For the patient participating in the group via telephone and/or video technology: the patient/guardian has verbally consented to participate in this group therapy session by televisit. The patient/guardian was encouraged to be in a private location due to personal health information being discussed and where they could not be overheard by individuals not participating in the group therapy. Although all [facility] staff/providers participating in this group therapy televisit are in a private location, all the risks of telephone and/or video technology used to conduct this group therapy session cannot be controlled by , (i.e. interruptions, unauthorized access/recording and technical difficulties).  Each patient understands that the visit can be stopped at any time for any reason, including if the conferencing connections are inadequate.     Geni Bers, MD, 01/18/2019

## 2019-01-18 NOTE — Progress Notes (Signed)
Prior authorization was done electronically using [employer]  Waiting for the insurance company to send [place] a decision for an  approval/denial notice. It may take between 24 - 72 hours.     PA KEY#: [id number]    Waiting on CMM questions

## 2019-01-18 NOTE — Progress Notes (Signed)
Patient requesting new pharmacy, [facility], prescription sent electronically to preferred pharmacy for both brand name (preferred) and for generic if brand name is not covered.

## 2019-01-18 NOTE — Progress Notes (Signed)
Prior authorization request for [place]    Patient has [facility] for prescription coverage.  I.D.# [id number]    BIN# : [id number]  PCN#: [id number]  Group: [id number]    Phone number: [phone]

## 2019-01-18 NOTE — Progress Notes (Signed)
Prior authorization request forSuboxione 8 mg/2 mg film -- two and one half per day -- Brand name only  Clinical Indication:opioid dependence in remission  Previous medications tried:  Buprenorphine/naloxone generic film 8 mg/2 mg highly variable responses - stomach and leg cramps, regurgitation, sweats, anxiety, palpitations, oversedation, depending on which generic she recieves, requiring adjustment of dosing from 1 film to 2.5 film per day depending on which type of generic.  Symptoms have been reproducible on several trials.  Requesting brand name to ensure consistent formulation and response

## 2019-01-18 NOTE — Progress Notes (Signed)
PER Patient (self), Cheryl Dixon is a 63 year old female has requested a refill of SUBOXONE GENERIC (PT calling stating films need PA, we are working on that, PT states she will need meds to hold her over until PA is approved/denied)       Last prescribed - start date: 01/12/2019 end date: 01/18/2019    Last Office Visit: 01/12/2019  Last Physical Exam: 05/27/2013  COLONOSCOPY due on 08/22/2017    Other Med Adult:  Most Recent BP Reading(s)  01/12/19 : 126/85        Cholesterol (mg/dl)   Date Value   01/04/2010 264 (H)     LOW DENSITY LIPOPROTEIN DIRECT (mg/dl)   Date Value   01/04/2010 101 (H)     HIGH DENSITY LIPOPROTEIN (mg/dl)   Date Value   01/04/2010 38     No results found for: TG      THYROID SCREEN TSH REFLEX FT4 (uIU/mL)   Date Value   01/12/2019 1.630         No results found for: TSH    HEMOGLOBIN A1C (%)   Date Value   01/12/2019 4.6       No results found for: POCA1C      INR (no units)   Date Value   02/16/2007 1.0 (L)   07/03/2006 < 1.0 (L)       SODIUM (mmol/L)   Date Value   01/12/2019 141       POTASSIUM (mmol/L)   Date Value   01/12/2019 4.7           CREATININE (mg/dL)   Date Value   01/12/2019 1.1       Documented patient preferred pharmacies:        [id number] [place] [place] [address]  Phone: [phone] Fax: [phone]

## 2019-01-19 NOTE — Progress Notes (Signed)
PRIOR AUTHORIZATION FOR SUBOXONE BRAND NAME FILMS    The prior authorization has been denied. The denial notice has been scanned into [employer] practitioner.      Denial Reason(s):    - Medication is not under plan's drug list and based on the information submitted, there is no evidence that the member has an allergy to an ingredient in the generic that is not in the brand name, nor is there documentation that the member has tried and failed a second formulary alternative product, including the generic tablets.       PA #: [id number]          Review of Systems  Physical Exam

## 2019-01-20 NOTE — Progress Notes (Signed)
I spoke to patient  PA denied  Next step will be generic tablets NOT brand-name film as I previously told her  We will try that when she gets her next refill  Last refill appears to have been for a full month (per PMP) so we'll have a wait a month to try this

## 2019-01-25 ENCOUNTER — Ambulatory Visit (HOSPITAL_BASED_OUTPATIENT_CLINIC_OR_DEPARTMENT_OTHER): Payer: Self-pay | Admitting: Registered Nurse

## 2019-01-25 ENCOUNTER — Other Ambulatory Visit (HOSPITAL_BASED_OUTPATIENT_CLINIC_OR_DEPARTMENT_OTHER): Payer: Self-pay | Admitting: Internal Medicine

## 2019-01-25 DIAGNOSIS — R300 Dysuria: Secondary | ICD-10-CM

## 2019-01-25 NOTE — Progress Notes (Unsigned)
PER Pharmacy, Cheryl Dixon is a 63 year old female has requested a refill of HEALTHYLAX.      Last Office Visit: 01/12/19 with PCP  Last Physical Exam: 05/27/2013    COLONOSCOPY due on 08/22/2017    Other Med Adult:  Most Recent BP Reading(s)  01/12/19 : 126/85        Cholesterol (mg/dl)   Date Value   01/04/2010 264 (H)     LOW DENSITY LIPOPROTEIN DIRECT (mg/dl)   Date Value   01/04/2010 101 (H)     HIGH DENSITY LIPOPROTEIN (mg/dl)   Date Value   01/04/2010 38     No results found for: TG      THYROID SCREEN TSH REFLEX FT4 (uIU/mL)   Date Value   01/12/2019 1.630         No results found for: TSH    HEMOGLOBIN A1C (%)   Date Value   01/12/2019 4.6       No results found for: POCA1C      INR (no units)   Date Value   02/16/2007 1.0 (L)   07/03/2006 < 1.0 (L)       SODIUM (mmol/L)   Date Value   01/12/2019 141       POTASSIUM (mmol/L)   Date Value   01/12/2019 4.7           CREATININE (mg/dL)   Date Value   01/12/2019 1.1       Documented patient preferred pharmacies:    [place], [place]  Phone: [phone] Fax: [phone]       Review of Systems  Physical Exam
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
## 2019-01-25 NOTE — Telephone Encounter (Signed)
Patient declined televisit and asked me to send message to PCP.    She was recently treated twice for UTI, most recently with ciprofloxacin (CIPRO) x 7 days.  Symptoms initially got better, but 2 days ago it returned- pelvic pain and frequency. She's drinking lots of fluids, urine is cloudy in the morning with very strong urine smell. No nausea, chills, or nausea/vomiting. Requesting PCP send abx.

## 2019-01-25 NOTE — Telephone Encounter (Addendum)
Screening for possible COVID:    1. In the past 14 days, has the patient had new or worsening (for chronic symptoms) fever, cough, shortness of breath, body aches, runny nose, sore throat, nausea, diarrhea, or lack of smell/taste? No   2. In the past 14 days, has the patient had COVID or suspected COVID?  No      In-person appointment scheduled on Thurs.

## 2019-01-25 NOTE — Telephone Encounter (Signed)
Ask her to come in for a UA and a vaginitis swab  She did get treated twice without testing, but it seems not to be working, and the last UA she had did NOT show infection.    Sometimes vaginitis will cause urinary symptoms so we should test for this to make sure we are treating the right thing, even if there is no vaginal discharge.

## 2019-01-25 NOTE — Telephone Encounter (Signed)
Regarding: BURN WHEN URINATING   ----- Message from Zenaida Deed sent at 01/25/2019  2:59 PM EDT -----  [name] [id number], 63 year old, female    Calls today:  Sick    What are the symptoms Burning sensation when urinating. Pt states that her UTI has not gotten away and it keeps coming back. Pt denies televisit and would like to speak to RN instead.  How long has patient been sick? A month or so.  What has pt. tried at home Prescribed medication.    Patient's language of care: English    Patient does not need an interpreter.    Patient's PCP: [name], MD

## 2019-01-26 ENCOUNTER — Telehealth (HOSPITAL_BASED_OUTPATIENT_CLINIC_OR_DEPARTMENT_OTHER): Payer: Self-pay | Admitting: Internal Medicine

## 2019-01-26 NOTE — Progress Notes (Signed)
Screening for possible COVID:    1. In the past 14 days, has the patient had new or worsening (for chronic symptoms) fever, cough, shortness of breath, body aches, runny nose, sore throat, nausea, diarrhea, or lack of smell/taste? No   2. In the past 14 days, has the patient had COVID or suspected COVID?  No

## 2019-01-27 ENCOUNTER — Ambulatory Visit: Payer: 59 | Attending: Internal Medicine | Admitting: Physician Assistant

## 2019-01-27 ENCOUNTER — Encounter (HOSPITAL_BASED_OUTPATIENT_CLINIC_OR_DEPARTMENT_OTHER): Payer: Self-pay | Admitting: Physician Assistant

## 2019-01-27 VITALS — BP 130/82 | HR 72 | Temp 97.3°F | Ht 65.0 in | Wt 194.0 lb

## 2019-01-27 DIAGNOSIS — R300 Dysuria: Secondary | ICD-10-CM | POA: Diagnosis present

## 2019-01-27 LAB — POC URINALYSIS
BILIRUBIN, URINE: NEGATIVE
GLUCOSE,URINE: NEGATIVE
KETONE, URINE: NEGATIVE
LEUKOCYTE ESTERASE: NEGATIVE
NITRITE, URINE: NEGATIVE
PH URINE: 6.5 (ref 5.0–8.0)
PROTEIN, URINE: NEGATIVE
SPECIFIC GRAVITY, URINE: 1.025 (ref 1.003–1.030)
UROBILINOGEN URINE: 0.2 (ref 0.2–1.0)

## 2019-01-27 LAB — URINALYSIS
BILIRUBIN, URINE: NEGATIVE
CASTS: NONE SEEN PER LPF
CRYSTALS: NONE SEEN
GLUCOSE, URINE: NEGATIVE MG/DL
KETONE, URINE: NEGATIVE MG/DL
LEUKOCYTE ESTERASE: NEGATIVE
NITRITE, URINE: NEGATIVE
PH URINE: 6.5 (ref 5.0–8.0)
PROTEIN, URINE: NEGATIVE MG/DL
SPECIFIC GRAVITY URINE: 1.015 (ref 1.003–1.035)

## 2019-01-28 ENCOUNTER — Telehealth (HOSPITAL_BASED_OUTPATIENT_CLINIC_OR_DEPARTMENT_OTHER): Payer: Self-pay | Admitting: Physician Assistant

## 2019-01-28 LAB — VAGINITIS PANEL PCR
BACTERIAL VAGINOSIS: NEGATIVE
CANDIDA GLABRATA: NEGATIVE
CANDIDA KRUSEI: NEGATIVE
CANDIDA SPECIES: NEGATIVE
TRICHOMONAS VAGINALIS: NEGATIVE

## 2019-01-28 LAB — CHLAMYDIA GC NAAT
CHLAMYDIA TRACHOMATIS NAAT: NEGATIVE
NEISSERIA GONORRHOEAE NAAT: NEGATIVE

## 2019-01-28 LAB — URINE CULTURE: URINE CULTURE/COLONY COUNT: NO GROWTH

## 2019-01-28 NOTE — Progress Notes (Signed)
Dear RN,    Please:    1. Create Telephone encounter for this patient.  2. Share with the patient testing is normal. The urine shows no urinary tract infection (even in the urine culture), the vaginal swabs are negative for bacterial vaginosis, yeast and gonorrhea and chlamydia. Her symptoms may be due to atrophic vaginitis as we discussed. I recommend she trial vagisil or a vaginal lubricant, this can be bought OTC or can be sent in. If we were to consider vaginal estrogen I generally like patients to speak with GYN first due to risks of estrogen use. I can place a referral if she would like.     Plan:  1. No further tests need to be done. .    2. Type of Outreach: 3 phone calls and if unable to reach send letter    3. Document the conversation in the Telephone Encounter and send the encounter back to me to complete orders and any further necessary documentation.     Thank you,  Verlene Mayer, PA-C

## 2019-01-28 NOTE — Progress Notes (Unsigned)
Called and left VM #1.       Dear RN,     Please:    1. Create Telephone encounter for this patient.   2. Share with the patient testing is normal. The urine shows no urinary tract infection (even in the urine culture), the vaginal swabs are negative for bacterial vaginosis, yeast and gonorrhea and chlamydia. Her symptoms may be due to atrophic vaginitis as we discussed. I recommend she trial vagisil or a vaginal lubricant, this can be bought OTC or can be sent in. If we were to consider vaginal estrogen I generally like patients to speak with GYN first due to risks of estrogen use. I can place a referral if she would like.     Plan:   1. No further tests need to be done. .     2. Type of Outreach: 3 phone calls and if unable to reach send letter     3. Document the conversation in the Telephone Encounter and send the encounter back to me to complete orders and any further necessary documentation.     Thank you,   Verlene Mayer, PA-C

## 2019-02-01 ENCOUNTER — Telehealth (HOSPITAL_BASED_OUTPATIENT_CLINIC_OR_DEPARTMENT_OTHER): Payer: Self-pay | Admitting: Registered Nurse

## 2019-02-01 ENCOUNTER — Ambulatory Visit (HOSPITAL_BASED_OUTPATIENT_CLINIC_OR_DEPARTMENT_OTHER): Payer: Self-pay | Admitting: Family Medicine

## 2019-02-01 NOTE — Progress Notes (Signed)
Spoke with Cheryl Dixon as to why she was not present at group.  She says she was given a month of suboxone and did not know she needed to come today.  She is also having some "urinary" issues that are causing her distress.  I told her that I would speak with Dr Lisa Roca.  She is otherwise doing well.

## 2019-02-01 NOTE — Progress Notes (Deleted)
Cheryl Dixon is a 63 year old female seen in follow up for opioid dependence:    Buprenorphine dose: 20mg   Response, adequacy of dose: ***  Relapses/close calls: ***  Trigger: ***  Meetings: ***    Social history/events:  ***    Present Medications:  HEALTHYLAX 17 g packet, TAKE 17G BY MOUTH DAILY MIX IN 64 OUNCE OF CLEAR LIQUID AND TAKE as directed, Disp: 28 packet, Rfl: 2  SUBOXONE 8-2 MG sublingual film, Take two and one-half films under the tongue daily.  Brand name only, no substitution, Disp: 35 Film, Rfl: 0  buprenorphine-naloxone (SUBOXONE) 8-2 MG sublingual film, Take two and one-half films daily, Disp: 35 Film, Rfl: 0  albuterol (VENTOLIN HFA) 108 (90 Base) MCG/ACT inhaler, Inhale 2 puffs into the lungs every 6 (six) hours as needed for Wheezing or Shortness of breath, Disp: 1 Inhaler, Rfl: 2  INCRUSE ELLIPTA 62.5 MCG/INH inhaler, INHALE 1 PUFF BY MOUTH INTO LUNGS DAILY, Disp: 1 Inhaler, Rfl: 11  buPROPion (WELLBUTRIN SR) 200 MG 12 hr tablet, Take 1 tablet by mouth 2 (two) times daily, Disp: 180 tablet, Rfl: 1  ipratropium-albuterol (DUO-NEB) 0.5-2.5 (3) MG/3ML SOLN Inhalation Solution, Take 3 mLs by nebulization 4 (four) times daily., Disp: 180 mL, Rfl: 0    No current facility-administered medications on file prior to visit.       PHQ-9 TOTAL SCORE 01/12/2019 06/01/2018 03/03/2017   Doc FlowSheet Total Score - - -   Doc FlowSheet Total Score 10 6 4            PHYSICAL EXAMINATION via video:  General appearance - healthy female in no distress  Neuro - nonfocal  Affect - normal  Behavior without evidence of intoxication or impairment    PMP Reviewed.  No unauthorized prescriptions since last refill.     Assessment:  Opioid Dependence  Comment: ***  Plan:  Continue buprenorphine, reviewed criteria for tapering when patient is ready. Discussed sources of support.  Patient is counseled regarding relapse prevention, involvement in recovery groups, potential side effects of buprenorphine, and eventual taper of  medication.    For the patient participating in the group via telephone and/or video technology: the patient/guardian has verbally consented to participate in this group therapy session by televisit. The patient/guardian was encouraged to be in a private location due to personal health information being discussed and where they could not be overheard by individuals not participating in the group therapy. Although all [facility] staff/providers participating in this group therapy televisit are in a private location, all the risks of telephone and/or video technology used to conduct this group therapy session cannot be controlled by [place], (i.e. interruptions, unauthorized access/recording and technical difficulties).  Each patient understands that the visit can be stopped at any time for any reason, including if the conferencing connections are inadequate.       Geni Bers, MD, 02/01/2019

## 2019-02-10 NOTE — Progress Notes (Signed)
Pt request I call her back in about 83minutes

## 2019-02-15 ENCOUNTER — Ambulatory Visit: Payer: 59 | Attending: Family Medicine | Admitting: Family Medicine

## 2019-02-15 DIAGNOSIS — F1120 Opioid dependence, uncomplicated: Secondary | ICD-10-CM | POA: Insufficient documentation

## 2019-02-15 MED ORDER — SUBOXONE 8-2 MG SL FILM
ORAL_FILM | SUBLINGUAL | 0 refills | Status: DC
Start: 2019-02-15 — End: 2019-03-08

## 2019-02-15 NOTE — Progress Notes (Signed)
Cheryl Dixon is a 63 year old female seen in follow up for opioid dependence:    Buprenorphine dose: 20mg   Response, adequacy of dose: good; doing really well with the brand-name only version, not experiencing the changes between packs  Relapses/close calls: none  Trigger: none  Meetings: none    Social history/events:  Doing well.  Has had a few doctor's visits recently for regular care, but they went fine.  No concerns about recovery. Happy about stable brand of suboxone and avoiding the ups and downs of side effects.    Present Medications:  HEALTHYLAX 17 g packet, TAKE 17G BY MOUTH DAILY MIX IN 64 OUNCE OF CLEAR LIQUID AND TAKE as directed, Disp: 28 packet, Rfl: 2  [DISCONTINUED] SUBOXONE 8-2 MG sublingual film, Take two and one-half films under the tongue daily.  Brand name only, no substitution, Disp: 35 Film, Rfl: 0  albuterol (VENTOLIN HFA) 108 (90 Base) MCG/ACT inhaler, Inhale 2 puffs into the lungs every 6 (six) hours as needed for Wheezing or Shortness of breath, Disp: 1 Inhaler, Rfl: 2  INCRUSE ELLIPTA 62.5 MCG/INH inhaler, INHALE 1 PUFF BY MOUTH INTO LUNGS DAILY, Disp: 1 Inhaler, Rfl: 11  buPROPion (WELLBUTRIN SR) 200 MG 12 hr tablet, Take 1 tablet by mouth 2 (two) times daily, Disp: 180 tablet, Rfl: 1  ipratropium-albuterol (DUO-NEB) 0.5-2.5 (3) MG/3ML SOLN Inhalation Solution, Take 3 mLs by nebulization 4 (four) times daily., Disp: 180 mL, Rfl: 0    No current facility-administered medications on file prior to visit.       PHQ-9 TOTAL SCORE 01/12/2019 06/01/2018 03/03/2017   Doc FlowSheet Total Score - - -   Doc FlowSheet Total Score 10 6 4            PHYSICAL EXAMINATION:  Examination deferred because of COVID-19 pandemic  Alert  No distress  Speaking in full sentences  Speech normal without evidence of intoxication or impairment    PMP Reviewed.  No unauthorized prescriptions since last refill.     Assessment:  Opioid Dependence  Comment: Doing well. Plan to continue with the request for brand name  suboxone as this has alleviated her previous symptoms.  Plan:  Continue buprenorphine, reviewed criteria for tapering when patient is ready. Discussed sources of support.  Patient is counseled regarding relapse prevention, involvement in recovery groups, potential side effects of buprenorphine, and eventual taper of medication.    Geni Bers, MD, 02/15/2019

## 2019-02-16 NOTE — Telephone Encounter (Signed)
Left vm for pt to call rcc back.

## 2019-02-17 NOTE — Telephone Encounter (Addendum)
Late doc- spoke to pt 10/22- informed her of providers message- pt agreeable to trial lubricant- uses vaseline- reports it helps  Doesn't want estrogen treatment at this time  Pt is interspersed in speaking to someone about excessive sweating that she has had for years.      Dear RN,    Please:    1. Create Telephone encounter for this patient.  2. Share with the patient testing is normal. The urine shows no urinary tract infection (even in the urine culture), the vaginal swabs are negative for bacterial vaginosis, yeast and gonorrhea and chlamydia. Her symptoms may be due to atrophic vaginitis as we discussed. I recommend she trial vagisil or a vaginal lubricant, this can be bought OTC or can be sent in. If we were to consider vaginal estrogen I generally like patients to speak with GYN first due to risks of estrogen use. I can place a referral if she would like.     Plan:  1. No further tests need to be done. .    2. Type of Outreach: 3 phone calls and if unable to reach send letter    3. Document the conversation in the Telephone Encounter and send the encounter back to me to complete orders and any further necessary documentation.     Thank you,  Verlene Mayer, PA-C          --- Message from Zenaida Deed sent at 02/17/2019 10:55 AM EDT -----  Regarding: [place]  Aaminah Wiebenga [id number], 63 year old, female    Calls today:  Clinical Questions ([place])    Name of person calling Patient.  Specific nature of request Returning phone call regarding LAB results. Did not see anything on encounters.  Return phone number [phone]    Patient's language of care: English    Patient does not need an interpreter.    Patient's PCP: [name], MD

## 2019-02-22 NOTE — Telephone Encounter (Signed)
Left vm for pt to call rcc back  Looking to book televisit

## 2019-03-01 ENCOUNTER — Ambulatory Visit (HOSPITAL_BASED_OUTPATIENT_CLINIC_OR_DEPARTMENT_OTHER): Payer: Self-pay | Admitting: Family Medicine

## 2019-03-01 ENCOUNTER — Other Ambulatory Visit (HOSPITAL_BASED_OUTPATIENT_CLINIC_OR_DEPARTMENT_OTHER): Payer: Self-pay | Admitting: Internal Medicine

## 2019-03-08 ENCOUNTER — Telehealth (HOSPITAL_BASED_OUTPATIENT_CLINIC_OR_DEPARTMENT_OTHER): Payer: Self-pay | Admitting: Registered Nurse

## 2019-03-08 ENCOUNTER — Other Ambulatory Visit (HOSPITAL_BASED_OUTPATIENT_CLINIC_OR_DEPARTMENT_OTHER): Payer: Self-pay | Admitting: Family Medicine

## 2019-03-08 ENCOUNTER — Telehealth (HOSPITAL_BASED_OUTPATIENT_CLINIC_OR_DEPARTMENT_OTHER): Payer: Self-pay

## 2019-03-08 MED ORDER — SUBOXONE 8-2 MG SL FILM
ORAL_FILM | SUBLINGUAL | 0 refills | Status: DC
Start: 2019-03-08 — End: 2019-03-08

## 2019-03-08 MED ORDER — BUPRENORPHINE HCL-NALOXONE HCL 8-2 MG SL FILM
ORAL_FILM | SUBLINGUAL | 0 refills | Status: DC
Start: 2019-03-08 — End: 2019-03-15

## 2019-03-08 NOTE — Telephone Encounter (Signed)
Returned call to patient states she will be out of medication in 2 days   When asked why she missed last visit states could not sign on and   She had food poisoning . Called pharmacy [facility]) to verify last fill date was told by pharmacy patient did not fill there and took script to fill at [facility]   Called to verify filled on 10/22 for 2 week script actually is due 11/6 per pharmacy

## 2019-03-08 NOTE — Telephone Encounter (Signed)
-----   Message from Corliss Marcus sent at 03/08/2019 10:32 AM EST -----  Regarding: Request a call  Contact: [place] [id number], 63 year old, female    Calls today:  Clinical Questions ([place])    Name of person calling   Specific nature of request to get a call back regarding missing meeting and medication for subxone. Please advise    Return phone number [phone]    Person calling on behalf of patient: Patient (self)    CALL BACK NUMBER:   Best time to call back:   Cell phone:   Other phone:    Patient's language of care: English    Patient does not need an interpreter.    Patient's PCP: [name], MD

## 2019-03-08 NOTE — Telephone Encounter (Signed)
Received multiple calls from Cheryl Dixon today regarding prescription confusion.  After speaking with Dr Fernande Boyden, a prescription was sent to [facility] which then needed to be resent taking of the "brand name only".    I called her back to assure that she was able to pick up her script and she had not tried yet.  She assured me that she would let me know if there were any further problems.

## 2019-03-15 ENCOUNTER — Ambulatory Visit: Payer: 59 | Attending: Family Medicine | Admitting: Family Medicine

## 2019-03-15 DIAGNOSIS — F1120 Opioid dependence, uncomplicated: Secondary | ICD-10-CM | POA: Diagnosis present

## 2019-03-15 MED ORDER — BUPRENORPHINE HCL-NALOXONE HCL 8-2 MG SL FILM
ORAL_FILM | SUBLINGUAL | 0 refills | Status: DC
Start: 2019-03-15 — End: 2019-03-29

## 2019-03-15 NOTE — Progress Notes (Signed)
Cheryl Dixon is a 63 year old female seen in follow up for opioid dependence:    Buprenorphine dose: 20mg   Response, adequacy of dose: good  Relapses/close calls: none  Trigger: none  Meetings: none    Social history/events:  Nothing new, feeling good  Staying busy somehow, which is helpful    Present Medications:  buprenorphine-naloxone (SUBOXONE) 8-2 MG sublingual film, Take two and one-half films under the tongue daily., Disp: 18 Film, Rfl: 0  HEALTHYLAX 17 g packet, TAKE 17G BY MOUTH DAILY MIX IN 64 OUNCE OF CLEAR LIQUID AND TAKE as directed, Disp: 28 packet, Rfl: 2  albuterol (VENTOLIN HFA) 108 (90 Base) MCG/ACT inhaler, Inhale 2 puffs into the lungs every 6 (six) hours as needed for Wheezing or Shortness of breath, Disp: 1 Inhaler, Rfl: 2  INCRUSE ELLIPTA 62.5 MCG/INH inhaler, INHALE 1 PUFF BY MOUTH INTO LUNGS DAILY, Disp: 1 Inhaler, Rfl: 11  buPROPion (WELLBUTRIN SR) 200 MG 12 hr tablet, Take 1 tablet by mouth 2 (two) times daily, Disp: 180 tablet, Rfl: 1  ipratropium-albuterol (DUO-NEB) 0.5-2.5 (3) MG/3ML SOLN Inhalation Solution, Take 3 mLs by nebulization 4 (four) times daily., Disp: 180 mL, Rfl: 0    No current facility-administered medications on file prior to visit.       PHQ-9 TOTAL SCORE 01/12/2019 06/01/2018 03/03/2017   Doc FlowSheet Total Score - - -   Doc FlowSheet Total Score 10 6 4            PHYSICAL EXAMINATION via video:  General appearance - healthy female in no distress  Neuro - nonfocal  Affect - normal  Behavior without evidence of intoxication or impairment    PMP reviewed. No unauthorized prescriptions since last refill.     Assessment:  Opioid Dependence  Comment: condition gradually improving on current dose, actively participating in group.  Plan:  Continue buprenorphine, reviewed criteria for tapering when patient is ready.    Patient is counseled regarding relapse prevention, involvement in recovery groups, potential side effects of buprenorphine, and eventual taper of  medication.    For the patient participating in the group via telephone and/or video technology: the patient/guardian has verbally consented to participate in this group therapy session by televisit. The patient/guardian was encouraged to be in a private location due to personal health information being discussed and where they could not be overheard by individuals not participating in the group therapy. Although all [facility] staff/providers participating in this group therapy televisit are in a private location, all the risks of telephone and/or video technology used to conduct this group therapy session cannot be controlled by [place], (i.e. interruptions, unauthorized access/recording and technical difficulties).  Each patient understands that the visit can be stopped at any time for any reason, including if the conferencing connections are inadequate.   We discussed the patients current medications. The patient expressed understanding and no barriers to adherence were identified.  1. The patient indicates understanding of these issues and agrees with the plan.    2. I reviewed the patient's medical information and medical history   3. I reconciled the patient's medication list and prepared and supplied needed refills.   4. I have reviewed the past medical, family, and social history sections including the medications and allergies.

## 2019-03-29 ENCOUNTER — Ambulatory Visit: Payer: 59 | Attending: Family Medicine | Admitting: Family Medicine

## 2019-03-29 DIAGNOSIS — F1120 Opioid dependence, uncomplicated: Secondary | ICD-10-CM | POA: Insufficient documentation

## 2019-03-29 MED ORDER — BUPRENORPHINE HCL-NALOXONE HCL 8-2 MG SL FILM
ORAL_FILM | SUBLINGUAL | 0 refills | Status: DC
Start: 2019-03-29 — End: 2019-04-12

## 2019-03-29 NOTE — Progress Notes (Signed)
Cheryl Dixon is a 63 year old female seen in follow up for opioid dependence:    Buprenorphine dose: 20mg   Response, adequacy of dose: good  Relapses/close calls:none  Trigger:none  Meetings:none    Social history/events:  Wanted [place] to be aware that she has a few extra suboxone, has not missed any, thinks it was because she needed extra a couple of weeks ago after had food poisoning  No issues, feeling good    Present Medications:  buprenorphine-naloxone (SUBOXONE) 8-2 MG sublingual film, Take two and one-half films under the tongue daily., Disp: 35 Film, Rfl: 0  albuterol (VENTOLIN HFA) 108 (90 Base) MCG/ACT inhaler, Inhale 2 puffs into the lungs every 6 (six) hours as needed for Wheezing or Shortness of breath, Disp: 1 Inhaler, Rfl: 2  INCRUSE ELLIPTA 62.5 MCG/INH inhaler, INHALE 1 PUFF BY MOUTH INTO LUNGS DAILY, Disp: 1 Inhaler, Rfl: 11  buPROPion (WELLBUTRIN SR) 200 MG 12 hr tablet, Take 1 tablet by mouth 2 (two) times daily, Disp: 180 tablet, Rfl: 1  ipratropium-albuterol (DUO-NEB) 0.5-2.5 (3) MG/3ML SOLN Inhalation Solution, Take 3 mLs by nebulization 4 (four) times daily., Disp: 180 mL, Rfl: 0    No current facility-administered medications on file prior to visit.       PHQ-9 TOTAL SCORE 01/12/2019 06/01/2018 03/03/2017   Doc FlowSheet Total Score - - -   Doc FlowSheet Total Score 10 6 4            PHYSICAL EXAMINATION via video:  General appearance - healthy female in no distress  Neuro - nonfocal  Affect - normal  Behavior without evidence of intoxication or impairment    PMP reviewed. No unauthorized prescriptions since last refill.     Assessment:  Opioid Dependence  Comment: condition gradually improving on current dose, actively participating in group.  Plan:  Continue buprenorphine, reviewed criteria for tapering when patient is ready.    Patient is counseled regarding relapse prevention, involvement in recovery groups, potential side effects of buprenorphine, and eventual taper of  medication.    For the patient participating in the group via telephone and/or video technology: the patient/guardian has verbally consented to participate in this group therapy session by televisit. The patient/guardian was encouraged to be in a private location due to personal health information being discussed and where they could not be overheard by individuals not participating in the group therapy. Although all [facility] staff/providers participating in this group therapy televisit are in a private location, all the risks of telephone and/or video technology used to conduct this group therapy session cannot be controlled by [place], (i.e. interruptions, unauthorized access/recording and technical difficulties).  Each patient understands that the visit can be stopped at any time for any reason, including if the conferencing connections are inadequate.   We discussed the patients current medications. The patient expressed understanding and no barriers to adherence were identified.  1. The patient indicates understanding of these issues and agrees with the plan.    2. I reviewed the patient's medical information and medical history   3. I reconciled the patient's medication list and prepared and supplied needed refills.   4. I have reviewed the past medical, family, and social history sections including the medications and allergies.

## 2019-04-12 ENCOUNTER — Ambulatory Visit: Payer: 59 | Attending: Family Medicine | Admitting: Family Medicine

## 2019-04-12 DIAGNOSIS — F1120 Opioid dependence, uncomplicated: Secondary | ICD-10-CM | POA: Insufficient documentation

## 2019-04-12 MED ORDER — BUPRENORPHINE HCL-NALOXONE HCL 8-2 MG SL FILM
ORAL_FILM | SUBLINGUAL | 0 refills | Status: DC
Start: 2019-04-12 — End: 2019-04-26

## 2019-04-12 NOTE — Progress Notes (Signed)
Cheryl Dixon is a 63 year old female seen in follow up for opioid dependence:    Buprenorphine dose:20mg   Response, adequacy of dose:good  Relapses/close calls:none  Trigger:none  Meetings:none    Social history/events:  Fine  Not much to share today  Nothing new    Present Medications:  buprenorphine-naloxone (SUBOXONE) 8-2 MG sublingual film, Take two and one-half films under the tongue daily., Disp: 35 Film, Rfl: 0  albuterol (VENTOLIN HFA) 108 (90 Base) MCG/ACT inhaler, Inhale 2 puffs into the lungs every 6 (six) hours as needed for Wheezing or Shortness of breath, Disp: 1 Inhaler, Rfl: 2  INCRUSE ELLIPTA 62.5 MCG/INH inhaler, INHALE 1 PUFF BY MOUTH INTO LUNGS DAILY, Disp: 1 Inhaler, Rfl: 11  buPROPion (WELLBUTRIN SR) 200 MG 12 hr tablet, Take 1 tablet by mouth 2 (two) times daily, Disp: 180 tablet, Rfl: 1  ipratropium-albuterol (DUO-NEB) 0.5-2.5 (3) MG/3ML SOLN Inhalation Solution, Take 3 mLs by nebulization 4 (four) times daily., Disp: 180 mL, Rfl: 0    No current facility-administered medications on file prior to visit.       PHQ-9 TOTAL SCORE 01/12/2019 06/01/2018 03/03/2017   Doc FlowSheet Total Score - - -   Doc FlowSheet Total Score 10 6 4            PHYSICAL EXAMINATION via video:  General appearance - healthy female in no distress  Neuro - nonfocal  Affect - normal  Behavior without evidence of intoxication or impairment    PMP reviewed. No unauthorized prescriptions since last refill.     Assessment:  Opioid Dependence  Comment: condition gradually improving on current dose, actively participating in group.  Plan:  Continue buprenorphine, reviewed criteria for tapering when patient is ready.    Patient is counseled regarding relapse prevention, involvement in recovery groups, potential side effects of buprenorphine, and eventual taper of medication.    For the patient participating in the group via telephone and/or video technology: the patient/guardian has verbally consented to  participate in this group therapy session by televisit. The patient/guardian was encouraged to be in a private location due to personal health information being discussed and where they could not be overheard by individuals not participating in the group therapy. Although all [facility] staff/providers participating in this group therapy televisit are in a private location, all the risks of telephone and/or video technology used to conduct this group therapy session cannot be controlled by [place], (i.e. interruptions, unauthorized access/recording and technical difficulties).  Each patient understands that the visit can be stopped at any time for any reason, including if the conferencing connections are inadequate.   We discussed the patients current medications. The patient expressed understanding and no barriers to adherence were identified.  1. The patient indicates understanding of these issues and agrees with the plan.    2. I reviewed the patient's medical information and medical history   3. I reconciled the patient's medication list and prepared and supplied needed refills.   4. I have reviewed the past medical, family, and social history sections including the medications and allergies.

## 2019-04-17 ENCOUNTER — Other Ambulatory Visit (HOSPITAL_BASED_OUTPATIENT_CLINIC_OR_DEPARTMENT_OTHER): Payer: Self-pay | Admitting: Family Medicine

## 2019-04-17 DIAGNOSIS — F1120 Opioid dependence, uncomplicated: Secondary | ICD-10-CM

## 2019-04-17 NOTE — Telephone Encounter (Signed)
PER Pharmacy, Sammara Wareham is a 63 year old female has requested a refill of albuterol.      Last Office Visit: 04/12/19 with lael greenstein  Last Physical Exam: 05/27/2013      Other Med Adult:  Most Recent BP Reading(s)  01/27/19 : 130/82        Cholesterol (mg/dl)   Date Value   01/04/2010 264 (H)     LOW DENSITY LIPOPROTEIN DIRECT (mg/dl)   Date Value   01/04/2010 101 (H)     HIGH DENSITY LIPOPROTEIN (mg/dl)   Date Value   01/04/2010 38     No results found for: TG      THYROID SCREEN TSH REFLEX FT4 (uIU/mL)   Date Value   01/12/2019 1.630         No results found for: TSH    HEMOGLOBIN A1C (%)   Date Value   01/12/2019 4.6       No results found for: POCA1C      INR (no units)   Date Value   02/16/2007 1.0 (L)   07/03/2006 < 1.0 (L)       SODIUM (mmol/L)   Date Value   01/12/2019 141       POTASSIUM (mmol/L)   Date Value   01/12/2019 4.7           CREATININE (mg/dL)   Date Value   01/12/2019 1.1       Documented patient preferred pharmacies:      [facility], [place]  Phone: [phone] Fax: [phone]

## 2019-04-20 ENCOUNTER — Other Ambulatory Visit (HOSPITAL_BASED_OUTPATIENT_CLINIC_OR_DEPARTMENT_OTHER): Payer: Self-pay | Admitting: Internal Medicine

## 2019-04-20 NOTE — Telephone Encounter (Signed)
PER Pharmacy, Cheryl Dixon is a 63 year old female has requested a refill of                -  Nicotine patch.      Last Office Visit: 04/12/2019 with Quenton Fetter  Last Physical Exam: 05/27/2013    COLONOSCOPY due on 08/22/2017    Other Med Adult:  Most Recent BP Reading(s)  01/27/19 : 130/82        Cholesterol (mg/dl)   Date Value   01/04/2010 264 (H)     LOW DENSITY LIPOPROTEIN DIRECT (mg/dl)   Date Value   01/04/2010 101 (H)     HIGH DENSITY LIPOPROTEIN (mg/dl)   Date Value   01/04/2010 38     No results found for: TG      THYROID SCREEN TSH REFLEX FT4 (uIU/mL)   Date Value   01/12/2019 1.630         No results found for: TSH    HEMOGLOBIN A1C (%)   Date Value   01/12/2019 4.6       No results found for: POCA1C      INR (no units)   Date Value   02/16/2007 1.0 (L)   07/03/2006 < 1.0 (L)       SODIUM (mmol/L)   Date Value   01/12/2019 141       POTASSIUM (mmol/L)   Date Value   01/12/2019 4.7           CREATININE (mg/dL)   Date Value   01/12/2019 1.1       Documented patient preferred pharmacies:      [facility], [place]  Phone: [phone] Fax: [phone]

## 2019-04-26 ENCOUNTER — Ambulatory Visit: Payer: 59 | Attending: Family Medicine | Admitting: Family Medicine

## 2019-04-26 DIAGNOSIS — F1120 Opioid dependence, uncomplicated: Secondary | ICD-10-CM | POA: Insufficient documentation

## 2019-04-26 MED ORDER — BUPRENORPHINE HCL-NALOXONE HCL 8-2 MG SL FILM
ORAL_FILM | SUBLINGUAL | 0 refills | Status: DC
Start: 2019-04-26 — End: 2019-05-10

## 2019-04-26 NOTE — Progress Notes (Signed)
Cheryl Dixon is a 63 year old female seen in follow up for opioid dependence:    Buprenorphine dose:20mg   Response, adequacy of dose:good  Relapses/close calls:none  Trigger:none  Meetings:none    Social history/events:  Trying to quit smoking - has tried multiple times  Wants to do it cold [place] this time  Tried different medications previously    Present Medications:      nicotine (NICODERM CQ) 21 MG/24HR, APPLY 1 PATCH TOPICALLY DAILY, Disp: 28 patch, Rfl: 1      albuterol (VENTOLIN HFA) 108 (90 Base) MCG/ACT inhaler, INHALE 2 PUFFS INTO THE LUNGS EVERY 6 HOURS AS NEEDED FOR WHEEZING OR SHORTNESS OF BREATH, Disp: 8.5 g, Rfl: 11      buprenorphine-naloxone (SUBOXONE) 8-2 MG sublingual film, Take two and one-half films under the tongue daily., Disp: 35 Film, Rfl: 0      INCRUSE ELLIPTA 62.5 MCG/INH inhaler, INHALE 1 PUFF BY MOUTH INTO LUNGS DAILY, Disp: 1 Inhaler, Rfl: 11      buPROPion (WELLBUTRIN SR) 200 MG 12 hr tablet, Take 1 tablet by mouth 2 (two) times daily, Disp: 180 tablet, Rfl: 1      ipratropium-albuterol (DUO-NEB) 0.5-2.5 (3) MG/3ML SOLN Inhalation Solution, Take 3 mLs by nebulization 4 (four) times daily., Disp: 180 mL, Rfl: 0    No current facility-administered medications on file prior to visit.       PHQ-9 TOTAL SCORE 01/12/2019 06/01/2018 03/03/2017   Doc FlowSheet Total Score - - -   Doc FlowSheet Total Score 10 6 4            PHYSICAL EXAMINATION via video:  General appearance - healthy female in no distress  Neuro - nonfocal  Affect - normal  Behavior without evidence of intoxication or impairment    PMP reviewed. No unauthorized prescriptions since last refill.     Assessment:  Opioid Dependence  Comment: condition gradually improving on current dose, actively participating in group.  Plan:  Continue buprenorphine, reviewed criteria for tapering when patient is ready.    Patient is counseled regarding relapse prevention, involvement in recovery groups, potential side effects  of buprenorphine, and eventual taper of medication.    For the patient participating in the group via telephone and/or video technology: the patient/guardian has verbally consented to participate in this group therapy session by televisit. The patient/guardian was encouraged to be in a private location due to personal health information being discussed and where they could not be overheard by individuals not participating in the group therapy. Although all [facility] staff/providers participating in this group therapy televisit are in a private location, all the risks of telephone and/or video technology used to conduct this group therapy session cannot be controlled by [place], (i.e. interruptions, unauthorized access/recording and technical difficulties).  Each patient understands that the visit can be stopped at any time for any reason, including if the conferencing connections are inadequate.   We discussed the patients current medications. The patient expressed understanding and no barriers to adherence were identified.  1. The patient indicates understanding of these issues and agrees with the plan.    2. I reviewed the patient's medical information and medical history   3. I reconciled the patient's medication list and prepared and supplied needed refills.   4. I have reviewed the past medical, family, and social history sections including the medications and allergies.

## 2019-05-05 DIAGNOSIS — U071 COVID-19: Secondary | ICD-10-CM | POA: Insufficient documentation

## 2019-05-10 ENCOUNTER — Ambulatory Visit: Payer: 59 | Attending: Family Medicine | Admitting: Family Medicine

## 2019-05-10 DIAGNOSIS — F1120 Opioid dependence, uncomplicated: Secondary | ICD-10-CM | POA: Insufficient documentation

## 2019-05-10 MED ORDER — BUPRENORPHINE HCL-NALOXONE HCL 8-2 MG SL FILM
ORAL_FILM | SUBLINGUAL | 0 refills | Status: DC
Start: 2019-05-10 — End: 2019-05-24

## 2019-05-10 NOTE — Progress Notes (Signed)
Cheryl Dixon is a 64 year old female seen in follow up for opioid dependence:    Buprenorphine dose:20mg   Response, adequacy of dose:good  Relapses/close calls:none  Trigger:none  Meetings:none    Social history/events:  Had a good new year  Feeling thankful that her son is doing a lot better and got visitation rights with his kids again  Trying to stay busy - cooking, staying out of her bedroom    Present Medications:      buprenorphine-naloxone (SUBOXONE) 8-2 MG sublingual film, Take two and one-half films under the tongue daily., Disp: 35 Film, Rfl: 0      nicotine (NICODERM CQ) 21 MG/24HR, APPLY 1 PATCH TOPICALLY DAILY, Disp: 28 patch, Rfl: 1      albuterol (VENTOLIN HFA) 108 (90 Base) MCG/ACT inhaler, INHALE 2 PUFFS INTO THE LUNGS EVERY 6 HOURS AS NEEDED FOR WHEEZING OR SHORTNESS OF BREATH, Disp: 8.5 g, Rfl: 11      INCRUSE ELLIPTA 62.5 MCG/INH inhaler, INHALE 1 PUFF BY MOUTH INTO LUNGS DAILY, Disp: 1 Inhaler, Rfl: 11      buPROPion (WELLBUTRIN SR) 200 MG 12 hr tablet, Take 1 tablet by mouth 2 (two) times daily, Disp: 180 tablet, Rfl: 1      ipratropium-albuterol (DUO-NEB) 0.5-2.5 (3) MG/3ML SOLN Inhalation Solution, Take 3 mLs by nebulization 4 (four) times daily., Disp: 180 mL, Rfl: 0    No current facility-administered medications on file prior to visit.       PHQ-9 TOTAL SCORE 01/12/2019 06/01/2018 03/03/2017   Doc FlowSheet Total Score - - -   Doc FlowSheet Total Score 10 6 4            PHYSICAL EXAMINATION via video:  General appearance - healthy female in no distress  Neuro - nonfocal  Affect - normal  Behavior without evidence of intoxication or impairment    PMP reviewed. No unauthorized prescriptions since last refill.     Assessment:  Opioid Dependence  Comment: condition gradually improving on current dose, actively participating in group.  Plan:  Continue buprenorphine, reviewed criteria for tapering when patient is ready.    Patient is counseled regarding relapse prevention,  involvement in recovery groups, potential side effects of buprenorphine, and eventual taper of medication.    For the patient participating in the group via telephone and/or video technology: the patient/guardian has verbally consented to participate in this group therapy session by televisit. The patient/guardian was encouraged to be in a private location due to personal health information being discussed and where they could not be overheard by individuals not participating in the group therapy. Although all [facility] staff/providers participating in this group therapy televisit are in a private location, all the risks of telephone and/or video technology used to conduct this group therapy session cannot be controlled by [place], (i.e. interruptions, unauthorized access/recording and technical difficulties).  Each patient understands that the visit can be stopped at any time for any reason, including if the conferencing connections are inadequate.   We discussed the patients current medications. The patient expressed understanding and no barriers to adherence were identified.  1. The patient indicates understanding of these issues and agrees with the plan.    2. I reviewed the patient's medical information and medical history   3. I reconciled the patient's medication list and prepared and supplied needed refills.   4. I have reviewed the past medical, family, and social history sections including the medications and allergies.

## 2019-05-24 ENCOUNTER — Ambulatory Visit: Payer: 59 | Attending: Family Medicine | Admitting: Family Medicine

## 2019-05-24 DIAGNOSIS — F17200 Nicotine dependence, unspecified, uncomplicated: Secondary | ICD-10-CM | POA: Diagnosis present

## 2019-05-24 DIAGNOSIS — F1120 Opioid dependence, uncomplicated: Secondary | ICD-10-CM | POA: Insufficient documentation

## 2019-05-24 MED ORDER — NICOTINE 14 MG/24HR TD PT24
1.0000 | MEDICATED_PATCH | TRANSDERMAL | 1 refills | Status: DC
Start: 2019-05-24 — End: 2019-07-17

## 2019-05-24 MED ORDER — BUPRENORPHINE HCL-NALOXONE HCL 8-2 MG SL FILM
ORAL_FILM | SUBLINGUAL | 0 refills | Status: DC
Start: 2019-05-24 — End: 2019-06-07

## 2019-05-24 NOTE — Progress Notes (Signed)
Cheryl Dixon is a 64 year old female seen in follow up for opioid dependence:    Buprenorphine dose:20mg   Response, adequacy of dose:good  Relapses/close calls:none  Trigger:none  Meetings:none    Social history/events:  Things are good  Needs more patches - cut down a lot on cigarettes, now down to about 1 per day, feeling good about it    Present Medications:      buprenorphine-naloxone (SUBOXONE) 8-2 MG sublingual film, Take two and one-half films under the tongue daily., Disp: 35 Film, Rfl: 0      nicotine (NICODERM CQ) 21 MG/24HR, APPLY 1 PATCH TOPICALLY DAILY, Disp: 28 patch, Rfl: 1      albuterol (VENTOLIN HFA) 108 (90 Base) MCG/ACT inhaler, INHALE 2 PUFFS INTO THE LUNGS EVERY 6 HOURS AS NEEDED FOR WHEEZING OR SHORTNESS OF BREATH, Disp: 8.5 g, Rfl: 11      INCRUSE ELLIPTA 62.5 MCG/INH inhaler, INHALE 1 PUFF BY MOUTH INTO LUNGS DAILY, Disp: 1 Inhaler, Rfl: 11      buPROPion (WELLBUTRIN SR) 200 MG 12 hr tablet, Take 1 tablet by mouth 2 (two) times daily, Disp: 180 tablet, Rfl: 1      ipratropium-albuterol (DUO-NEB) 0.5-2.5 (3) MG/3ML SOLN Inhalation Solution, Take 3 mLs by nebulization 4 (four) times daily., Disp: 180 mL, Rfl: 0    No current facility-administered medications on file prior to visit.       PHQ-9 TOTAL SCORE 01/12/2019 06/01/2018 03/03/2017   Doc FlowSheet Total Score - - -   Doc FlowSheet Total Score 10 6 4            PHYSICAL EXAMINATION via video:  General appearance - healthy female in no distress  Neuro - nonfocal  Affect - normal  Behavior without evidence of intoxication or impairment    PMP reviewed. No unauthorized prescriptions since last refill.     Assessment:  Opioid Dependence  Comment: condition gradually improving on current dose, actively participating in group.  Plan:  Continue buprenorphine, reviewed criteria for tapering when patient is ready.    Tobacco use - patches going well, going down to 14mg  patches    Patient is counseled regarding relapse prevention,  involvement in recovery groups, potential side effects of buprenorphine, and eventual taper of medication.    For the patient participating in the group via telephone and/or video technology: the patient/guardian has verbally consented to participate in this group therapy session by televisit. The patient/guardian was encouraged to be in a private location due to personal health information being discussed and where they could not be overheard by individuals not participating in the group therapy. Although all [facility] staff/providers participating in this group therapy televisit are in a private location, all the risks of telephone and/or video technology used to conduct this group therapy session cannot be controlled by [place], (i.e. interruptions, unauthorized access/recording and technical difficulties).  Each patient understands that the visit can be stopped at any time for any reason, including if the conferencing connections are inadequate.   We discussed the patients current medications. The patient expressed understanding and no barriers to adherence were identified.  1. The patient indicates understanding of these issues and agrees with the plan.    2. I reviewed the patient's medical information and medical history   3. I reconciled the patient's medication list and prepared and supplied needed refills.   4. I have reviewed the past medical, family, and social history sections including the medications and allergies.

## 2019-06-07 ENCOUNTER — Ambulatory Visit: Payer: 59 | Admitting: Family Medicine

## 2019-06-07 DIAGNOSIS — F1120 Opioid dependence, uncomplicated: Secondary | ICD-10-CM

## 2019-06-07 MED ORDER — BUPRENORPHINE HCL-NALOXONE HCL 8-2 MG SL FILM
ORAL_FILM | SUBLINGUAL | 0 refills | Status: DC
Start: 2019-06-07 — End: 2019-06-21

## 2019-06-07 NOTE — Progress Notes (Deleted)
Cheryl Dixon is a 63 year old female seen in follow up for opioid dependence:    Buprenorphine dose: ***  Response, adequacy of dose: ***  Relapses/close calls: ***  Trigger: ***  Meetings: ***    Social history/events:  ***    Present Medications:      buprenorphine-naloxone (SUBOXONE) 8-2 MG sublingual film, Take two and one-half films under the tongue daily., Disp: 35 Film, Rfl: 0      nicotine (NICODERM CQ) 14 MG/24HR, Place 1 patch onto the skin daily, Disp: 30 patch, Rfl: 1      albuterol (VENTOLIN HFA) 108 (90 Base) MCG/ACT inhaler, INHALE 2 PUFFS INTO THE LUNGS EVERY 6 HOURS AS NEEDED FOR WHEEZING OR SHORTNESS OF BREATH, Disp: 8.5 g, Rfl: 11      INCRUSE ELLIPTA 62.5 MCG/INH inhaler, INHALE 1 PUFF BY MOUTH INTO LUNGS DAILY, Disp: 1 Inhaler, Rfl: 11      buPROPion (WELLBUTRIN SR) 200 MG 12 hr tablet, Take 1 tablet by mouth 2 (two) times daily, Disp: 180 tablet, Rfl: 1      ipratropium-albuterol (DUO-NEB) 0.5-2.5 (3) MG/3ML SOLN Inhalation Solution, Take 3 mLs by nebulization 4 (four) times daily., Disp: 180 mL, Rfl: 0    No current facility-administered medications on file prior to visit.       PHQ-9 TOTAL SCORE 01/12/2019 06/01/2018 03/03/2017   Doc FlowSheet Total Score - - -   Doc FlowSheet Total Score 10 6 4            PHYSICAL EXAMINATION via video:  General appearance - healthy female in no distress  Neuro - nonfocal  Affect - normal  Behavior without evidence of intoxication or impairment    @PMPREV @    Assessment:  Opioid Dependence  Comment: ***  Plan:  ***  Patient is counseled regarding relapse prevention, involvement in recovery groups, potential side effects of buprenorphine, and eventual taper of medication.    For the patient participating in the group via telephone and/or video technology: the patient/guardian has verbally consented to participate in this group therapy session by televisit. The patient/guardian was encouraged to be in a private location due to personal health  information being discussed and where they could not be overheard by individuals not participating in the group therapy. Although all [facility] staff/providers participating in this group therapy televisit are in a private location, all the risks of telephone and/or video technology used to conduct this group therapy session cannot be controlled by [place], (i.e. interruptions, unauthorized access/recording and technical difficulties).  Each patient understands that the visit can be stopped at any time for any reason, including if the conferencing connections are inadequate.   We discussed the patients current medications. The patient expressed understanding and no barriers to adherence were identified.  1. The patient indicates understanding of these issues and agrees with the plan.    2. I reviewed the patient's medical information and medical history   3. I reconciled the patient's medication list and prepared and supplied needed refills.   4. I have reviewed the past medical, family, and social history sections including the medications and allergies.

## 2019-06-08 NOTE — Progress Notes (Signed)
Patient initially presented to group, but was disconnected due to technical issues.  PMP reviewed   Prescription sent  [facility] RN in contact with patient

## 2019-06-15 ENCOUNTER — Encounter: Payer: Self-pay | Admitting: Pediatrics

## 2019-06-15 ENCOUNTER — Other Ambulatory Visit: Payer: Self-pay | Admitting: Pediatrics

## 2019-06-21 ENCOUNTER — Ambulatory Visit: Payer: 59 | Attending: Family Medicine | Admitting: Family Medicine

## 2019-06-21 DIAGNOSIS — F1120 Opioid dependence, uncomplicated: Secondary | ICD-10-CM | POA: Diagnosis not present

## 2019-06-21 MED ORDER — BUPRENORPHINE HCL-NALOXONE HCL 8-2 MG SL FILM
ORAL_FILM | SUBLINGUAL | 0 refills | Status: DC
Start: 2019-06-21 — End: 2019-07-05

## 2019-06-21 NOTE — Progress Notes (Signed)
Cheryl Dixon is a 64 year old female seen in follow up for opioid dependence:    Buprenorphine dose:20mg   Response, adequacy of dose:good  Relapses/close calls:none  Trigger:none  Meetings:none    Social history/events:  Son isn't speaking to her again right now, has happened before  Hard time with not being able to hug her family     Still not smoking, feeling good about it  Able to taste food so much better!    Present Medications:      buprenorphine-naloxone (SUBOXONE) 8-2 MG sublingual film, Take two and one-half films under the tongue daily., Disp: 35 Film, Rfl: 0      nicotine (NICODERM CQ) 14 MG/24HR, Place 1 patch onto the skin daily, Disp: 30 patch, Rfl: 1      albuterol (VENTOLIN HFA) 108 (90 Base) MCG/ACT inhaler, INHALE 2 PUFFS INTO THE LUNGS EVERY 6 HOURS AS NEEDED FOR WHEEZING OR SHORTNESS OF BREATH, Disp: 8.5 g, Rfl: 11      INCRUSE ELLIPTA 62.5 MCG/INH inhaler, INHALE 1 PUFF BY MOUTH INTO LUNGS DAILY, Disp: 1 Inhaler, Rfl: 11      buPROPion (WELLBUTRIN SR) 200 MG 12 hr tablet, Take 1 tablet by mouth 2 (two) times daily, Disp: 180 tablet, Rfl: 1      ipratropium-albuterol (DUO-NEB) 0.5-2.5 (3) MG/3ML SOLN Inhalation Solution, Take 3 mLs by nebulization 4 (four) times daily., Disp: 180 mL, Rfl: 0    No current facility-administered medications on file prior to visit.       PHQ-9 TOTAL SCORE 01/12/2019 06/01/2018 03/03/2017   Doc FlowSheet Total Score - - -   Doc FlowSheet Total Score 10 6 4            PHYSICAL EXAMINATION via video:  General appearance - healthy female in no distress  Neuro - nonfocal  Affect - normal  Behavior without evidence of intoxication or impairment    PMP reviewed. No unauthorized prescriptions since last refill.     Assessment:  Opioid Dependence  Comment: condition gradually improving on current dose, actively participating in group.  Plan:  Continue buprenorphine, reviewed criteria for tapering when patient is ready.    Patient is counseled regarding  relapse prevention, involvement in recovery groups, potential side effects of buprenorphine, and eventual taper of medication.    For the patient participating in the group via telephone and/or video technology: the patient/guardian has verbally consented to participate in this group therapy session by televisit. The patient/guardian was encouraged to be in a private location due to personal health information being discussed and where they could not be overheard by individuals not participating in the group therapy. Although all [facility] staff/providers participating in this group therapy televisit are in a private location, all the risks of telephone and/or video technology used to conduct this group therapy session cannot be controlled by [place], (i.e. interruptions, unauthorized access/recording and technical difficulties).  Each patient understands that the visit can be stopped at any time for any reason, including if the conferencing connections are inadequate.   We discussed the patients current medications. The patient expressed understanding and no barriers to adherence were identified.  1. The patient indicates understanding of these issues and agrees with the plan.    2. I reviewed the patient's medical information and medical history   3. I reconciled the patient's medication list and prepared and supplied needed refills.   4. I have reviewed the past medical, family, and social history sections including the medications and allergies.

## 2019-06-29 ENCOUNTER — Other Ambulatory Visit: Payer: Self-pay

## 2019-06-29 ENCOUNTER — Ambulatory Visit (HOSPITAL_BASED_OUTPATIENT_CLINIC_OR_DEPARTMENT_OTHER): Payer: Self-pay

## 2019-06-29 NOTE — Telephone Encounter (Signed)
Regarding: abdominal pain not severe   ----- Message from Rhae Lerner sent at 06/29/2019 11:04 AM EST -----  Cheryl Dixon [id number], 64 year old, female    Calls today:  Sick    What are the symptoms patient states having abdominal pain not severe requesting to speak with rn  How long has patient been sick? Few days  What has pt. tried at home prescribed meds  Person calling on behalf of patient: Patient (self)    CALL BACK NUMBER: [phone]  Best time to call back: anytime  Cell phone:   Other phone:    Patient's language of care: English    Patient does not need an interpreter.    Patient's PCP: [name], MD

## 2019-06-29 NOTE — Telephone Encounter (Signed)
Called and spoke with patient, she reports she has had off and on issues with pelvic discomfort and dysuria since being seen in October. Reports sxs got better after abx buyt then would come back, she drinkds cranberry juice and symptoms improve for a while but then return again.    NO fevers, no flank pain, no nausea or vomiting.    Currently has had some burning with urination (better today after cranberry juice), pelvic discomfort-constantly feels like urinating.    Denies any discharge, does feel dry, reports she has been cleansing herself frequently and also using oils for dryness, advised sometimes this is too much and may actually cause sxs.  Asking about haviog her estrogen levels checked.    Advised in person visit for eval, she agreed, declined appt today,. Scheduled tomorrow.  Advised call back in the meantime with any new or worsening issues/concerns.    Screening for possible COVID:    1. In the past 14 days, has the patient been exposed to [place] or told to quarantine because of a COVID exposure?  No   2. In the past 14 days, has the patient had new or worsening (for chronic symptoms) fever, cough, shortness of breath, body aches, runny nose, sore throat, nausea, diarrhea, or lack of smell/taste? No   3. In the past 14 days, has the patient had COVID or suspected COVID?  No

## 2019-06-30 ENCOUNTER — Ambulatory Visit: Payer: 59 | Attending: Internal Medicine | Admitting: Internal Medicine

## 2019-06-30 ENCOUNTER — Encounter (HOSPITAL_BASED_OUTPATIENT_CLINIC_OR_DEPARTMENT_OTHER): Payer: Self-pay | Admitting: Internal Medicine

## 2019-06-30 ENCOUNTER — Other Ambulatory Visit: Payer: Self-pay

## 2019-06-30 VITALS — BP 142/80 | HR 75 | Temp 97.2°F | Wt 185.0 lb

## 2019-06-30 DIAGNOSIS — R102 Pelvic and perineal pain: Secondary | ICD-10-CM | POA: Diagnosis not present

## 2019-06-30 DIAGNOSIS — R3915 Urgency of urination: Secondary | ICD-10-CM | POA: Diagnosis present

## 2019-06-30 DIAGNOSIS — G479 Sleep disorder, unspecified: Secondary | ICD-10-CM | POA: Diagnosis not present

## 2019-06-30 DIAGNOSIS — F330 Major depressive disorder, recurrent, mild: Secondary | ICD-10-CM | POA: Insufficient documentation

## 2019-06-30 DIAGNOSIS — N952 Postmenopausal atrophic vaginitis: Secondary | ICD-10-CM | POA: Diagnosis present

## 2019-06-30 DIAGNOSIS — F1120 Opioid dependence, uncomplicated: Secondary | ICD-10-CM | POA: Diagnosis present

## 2019-06-30 DIAGNOSIS — R1024 Suprapubic pain: Secondary | ICD-10-CM

## 2019-06-30 DIAGNOSIS — R634 Abnormal weight loss: Secondary | ICD-10-CM | POA: Diagnosis present

## 2019-06-30 DIAGNOSIS — R030 Elevated blood-pressure reading, without diagnosis of hypertension: Secondary | ICD-10-CM | POA: Diagnosis present

## 2019-06-30 LAB — OXYCODONE SCREEN URINE
OXYCOD SCRN URINE: NEGATIVE
OXYCOD UR SPEC GRAV: 1.028 (ref 1.003–1.035)
OXYCOD URINE CREAT: 297 md/dL
OXYCOD URINE PH: 5.5 (ref 4.5–8.5)

## 2019-06-30 LAB — OPIATES URINE: OPIATES URINE: NEGATIVE

## 2019-06-30 LAB — FENTANYL URINE: FENTANYL URINE: NEGATIVE

## 2019-06-30 LAB — POC URINALYSIS
BILIRUBIN, URINE: NEGATIVE
GLUCOSE,URINE: NEGATIVE
LEUKOCYTE ESTERASE: NEGATIVE
NITRITE, URINE: NEGATIVE
PH URINE: 5.5 (ref 5.0–8.0)
PROTEIN, URINE: NEGATIVE
SPECIFIC GRAVITY, URINE: >=1.03 (ref 1.003–1.030)
UROBILINOGEN URINE: 0.2 (ref 0.2–1.0)

## 2019-06-30 LAB — METHADONE URINE: METHADONE URINE: NEGATIVE

## 2019-06-30 LAB — BENZODIAZEPINES URINE: BENZODIAZEPINES URINE: NEGATIVE

## 2019-06-30 LAB — COCAINE METABOLITES URINE: COCAINE METABOLITES URINE: NEGATIVE

## 2019-06-30 LAB — AMPHETAMINES URINE: AMPHETAMINES URINE: NEGATIVE

## 2019-06-30 LAB — BUPRENORPHINE SCREEN URINE: BUPRENORPHINE SCREEN URINE: POSITIVE

## 2019-06-30 LAB — CANNABINOIDS URINE: CANNABINOIDS URINE: POSITIVE — AB

## 2019-06-30 MED ORDER — ESTRADIOL 0.1 MG/GM VA CREA
TOPICAL_CREAM | VAGINAL | 5 refills | Status: DC
Start: 2019-06-30 — End: 2019-08-29

## 2019-06-30 MED ORDER — DOXEPIN HCL 10 MG PO CAPS
10.0000 mg | ORAL_CAPSULE | Freq: Every day | ORAL | 0 refills | Status: DC
Start: 2019-06-30 — End: 2021-02-25

## 2019-06-30 NOTE — Progress Notes (Signed)
Subjective     Cheryl Dixon is a 64 year old female presents for:    LLQ pain radiating to the back for many months now  Location is suprapubic  Feels like she needs to pee all the time  Urinates and feels briefly better but then has to urinate again soon after that  Drinking a liter or water a day  LLQ pain gets worse when she DOESN'T take suboxone  Bowel movements infrequently lately   Last bowel movement was 2 days ago  Usually it is once or twice a day  Felt like she was better when treated with estrogen  Has hot flashes and irritability  + vaginal dryness  Using some oils for that which help  No vaginal discharge  Not sexually active  No bood in stool or urine  No black stool    Sleep disturbance  Partly because of getting up to urinate  Partly just feeling restless and worried  Stopped taking antidepressant more than a year ago    OUD  Takes suboxone 10 am -- full dose at once  Interested in starting to taper    ROS  Lost 15 pounds in 2 months  Was not trying to lose weight  Most Recent Weight Reading(s)  06/30/19 : 83.9 kg (185 lb)  01/27/19 : 88 kg (194 lb)  01/12/19 : 87.5 kg (193 lb)  11/03/18 : 90.1 kg (198 lb 9.6 oz)  06/14/18 : 86.2 kg (190 lb)      Patient Active Problem List:     Opioid dependence on agonist therapy ([place])     Tobacco use disorder     Fibrocystic breast     Elevated BP     Knee pain     Chronic low back pain     OA (osteoarthritis) of knee     H. pylori infection     Major depressive disorder, recurrent episode, severe (HCC)     Occult blood in stools     Prediabetes     Epigastric pain     COPD with exacerbation (HCC)     Chronic obstructive pulmonary disease ([place])     Left foot pain     Adenomatous polyp of colon     Acquired deformity of toe, right     Dislocation of metatarsophalangeal joint of lesser toe, right, sequela    Review of patient's family history indicates:  Problem: Heart      Relation: Father          Age of Onset: (Not Specified)          Comment: CAD, died age  27  Problem: Pulmonary      Relation: Father          Age of Onset: (Not Specified)          Comment: COPD  Problem: Alcohol/Drug Abuse      Relation: Father          Age of Onset: (Not Specified)          Comment: alcohol use disorder  Problem: Mental/Emotional Disorders      Relation: Father          Age of Onset: (Not Specified)          Comment: PTSD  Problem: Heart      Relation: Mother          Age of Onset: (Not Specified)          Comment: CAD  Problem: Heart  Relation: Brother          Age of Onset: (Not Specified)          Comment: CAD, died age 10  Problem: Cancer - Breast      Relation: Maternal Aunt          Age of Onset: (Not Specified)          Comment: age 70  Problem: Cancer - Breast      Relation: Maternal Aunt          Age of Onset: (Not Specified)          Comment: age 49        buprenorphine-naloxone (SUBOXONE) 8-2 MG sublingual film, Take two and one-half films under the tongue daily., Disp: 35 Film, Rfl: 0      nicotine (NICODERM CQ) 14 MG/24HR, Place 1 patch onto the skin daily, Disp: 30 patch, Rfl: 1      albuterol (VENTOLIN HFA) 108 (90 Base) MCG/ACT inhaler, INHALE 2 PUFFS INTO THE LUNGS EVERY 6 HOURS AS NEEDED FOR WHEEZING OR SHORTNESS OF BREATH, Disp: 8.5 g, Rfl: 11      buPROPion (WELLBUTRIN SR) 200 MG 12 hr tablet, Take 1 tablet by mouth 2 (two) times daily, Disp: 180 tablet, Rfl: 1      ipratropium-albuterol (DUO-NEB) 0.5-2.5 (3) MG/3ML SOLN Inhalation Solution, Take 3 mLs by nebulization 4 (four) times daily., Disp: 180 mL, Rfl: 0    No current facility-administered medications for this visit.     Review of Patient's Allergies indicates:   Darvon                     Meperidine hcl              Comment:Nausea/vomit   Paxil [paroxetine]      Rash   Zoloft [sertraline *    Rash              Objective     BP (!) 142/80    Pulse 75    Temp 97.2 F (36.2 C) (Temporal)    Wt 83.9 kg (185 lb)    LMP 11/20/1991    SpO2 97%    BMI 30.79 kg/m     Physical Exam   Constitutional: No  distress.   HENT:   Head: Normocephalic and atraumatic.   Eyes: Conjunctivae are normal.   Pulmonary/Chest: Effort normal.   Abdominal: Soft. Bowel sounds are normal. She exhibits no distension.   Mild suprapubic tenderness without rebound or guarding  Well healed transverse scar in lower abdomen   Neurological: She is alert.   Psychiatric: She has a normal mood and affect.       UA trace heme, otherwise negative         Plan   Assessment and plan:  (R39.15) Urinary urgency  (primary encounter diagnosis)  (R10.2) Suprapubic pain  (N95.2) Postmenopausal atrophic vaginitis  Comment: while the patient declined a pelvic exam today, she did have one four months ago that was consistent with atrophic vaginitis.  This is the most likely cause of her urinary urgency and suprapubic discomfort.  No evidence of hyperglycemia on labs 4 months ago.  She may have mild constipation intermittently but not nearly severe enough to cause her urinary symptoms.  She is no longer taking benadryl or bupropion so has eliminated meds that can cause urinary retention.  She also had endometriosis and had multiple abdominal surgeries for this and for hernia repair in  the past  Plan: Trial of topical estrogens  - REFERRAL TO UROLOGY ( INT) for further evaluation if estrogens aren't effective.  The patient was educated regarding the risks, benefits, and potential side effects of new medication, including cardiovascular risks of estrogens  - consider abdominal imaging (PVR, CT Abd/pelvis) if this is not effective    (G47.9) Sleep disturbance  Comment: frequent urination may be playing a role.  Ddx includes depression and sleep apnea  Plan: doxepin (SINEQUAN) 10 MG capsule        Short term use of doxepin OK.  Discussed risk of urinary retention with this medication    (F33.0) Mild episode of recurrent major depressive disorder ([place])  Comment: symptoms are currently mild.  Off bupropion for a year.   Plan: low threshold to restart medications at  follow up    (F11.20) Opioid dependence on agonist therapy (HCC)  Comment: stable on buprenorphine.  Handout given on tapering  Plan: continue buprenorphine  Check UDS    (R03.0) Elevated blood pressure reading  Comment: has been mostly normal on recent readings in clinic  Most Recent BP Reading(s)  06/30/19 : (!) 142/80  01/27/19 : 130/82  01/12/19 : 126/85  11/03/18 : 123/84  06/14/18 : 160/84  Plan: recheck at next appt  follow up in six weeks    Unintentional weight loss  Encouraged mammogram and colonoscopy  Will discuss again at next visit   - recheck weight in six weeks    1. The patient indicates understanding of these issues and agrees with the plan.  Brief care plan is updated and reviewed with the patient.   2. The patient is given an After Visit Summary sheet that lists all medications with directions, allergies, orders placed during this encounter, and follow-up instructions.   3. I reviewed the patient's medical information and medical history   4. I reconciled the patient's medication list and prepared and supplied needed refills.   5. I have reviewed the past medical, family, and social history sections including the medications and allergies.    [name], MD    Most Recent BP Reading(s)  06/30/19 : Marland Kitchen 142/80  01/27/19 : 130/82  01/12/19 : 126/85  11/03/18 : 123/84  06/14/18 : 160/84      No orders of the defined types were placed in this encounter.

## 2019-06-30 NOTE — Progress Notes (Signed)
Results discussed with patient at time of appointment.

## 2019-07-01 NOTE — Progress Notes (Signed)
Expected result

## 2019-07-05 ENCOUNTER — Other Ambulatory Visit (HOSPITAL_BASED_OUTPATIENT_CLINIC_OR_DEPARTMENT_OTHER): Payer: Self-pay | Admitting: Family Medicine

## 2019-07-05 ENCOUNTER — Ambulatory Visit (HOSPITAL_BASED_OUTPATIENT_CLINIC_OR_DEPARTMENT_OTHER): Payer: 59 | Admitting: Family Medicine

## 2019-07-05 DIAGNOSIS — F1120 Opioid dependence, uncomplicated: Secondary | ICD-10-CM

## 2019-07-05 MED ORDER — BUPRENORPHINE HCL-NALOXONE HCL 8-2 MG SL FILM
ORAL_FILM | SUBLINGUAL | 0 refills | Status: DC
Start: 2019-07-05 — End: 2019-07-19

## 2019-07-05 NOTE — Telephone Encounter (Signed)
Pt did not attend group today but due for suboxone refill. Will refill with plan for RN outreach.

## 2019-07-14 ENCOUNTER — Telehealth (HOSPITAL_BASED_OUTPATIENT_CLINIC_OR_DEPARTMENT_OTHER): Payer: Self-pay | Admitting: Registered Nurse

## 2019-07-14 NOTE — Telephone Encounter (Signed)
-----   Message from Corliss Marcus sent at 07/14/2019 12:10 PM EDT -----  Regarding: Questions  Contact: [phone]  Cheryl Dixon [id number], 64 year old, female    Calls today:  Clinical Questions (NON-SICK CLINICAL QUESTIONS ONLY)    Name of person calling   Specific nature of request a call back from nurse regarding visit with doctor roll. Patient has questions about that visit. Please advise   Return phone number [phone]  Person calling on behalf of patient: Patient (self)      Patient's language of care: English    Patient does not need an interpreter.    Patient's PCP: [name], MD

## 2019-07-15 NOTE — Telephone Encounter (Signed)
Pt calling to let Dr. Lisa Roca know she is feeling better.  Pt requesting letter for work as she uncomfortable going back at this time with her COPD  [employer] risk letter created and sent to pt via mail.

## 2019-07-16 ENCOUNTER — Other Ambulatory Visit (HOSPITAL_BASED_OUTPATIENT_CLINIC_OR_DEPARTMENT_OTHER): Payer: Self-pay | Admitting: Family Medicine

## 2019-07-16 DIAGNOSIS — F17200 Nicotine dependence, unspecified, uncomplicated: Secondary | ICD-10-CM

## 2019-07-16 DIAGNOSIS — F1120 Opioid dependence, uncomplicated: Secondary | ICD-10-CM

## 2019-07-17 NOTE — Telephone Encounter (Signed)
PER Pharmacy, Cheryl Dixon is a 64 year old female has requested a refill of nicotine patches.      Last Office Visit: 06/30/2019 with david roll  Last Physical Exam: 05/27/2013      Other Med Adult:  Most Recent BP Reading(s)  06/30/19 : (!) 142/80        Cholesterol (mg/dl)   Date Value   01/04/2010 264 (H)     LOW DENSITY LIPOPROTEIN DIRECT (mg/dl)   Date Value   01/04/2010 101 (H)     HIGH DENSITY LIPOPROTEIN (mg/dl)   Date Value   01/04/2010 38     No results found for: TG      THYROID SCREEN TSH REFLEX FT4 (uIU/mL)   Date Value   01/12/2019 1.630         No results found for: TSH    HEMOGLOBIN A1C (%)   Date Value   01/12/2019 4.6       No results found for: POCA1C      INR (no units)   Date Value   02/16/2007 1.0 (L)   07/03/2006 < 1.0 (L)       SODIUM (mmol/L)   Date Value   01/12/2019 141       POTASSIUM (mmol/L)   Date Value   01/12/2019 4.7           CREATININE (mg/dL)   Date Value   01/12/2019 1.1       Documented patient preferred pharmacies:    [id number] - [place], [place] [address]  Phone: [phone] Fax: [phone]

## 2019-07-19 ENCOUNTER — Ambulatory Visit: Payer: 59 | Attending: Family Medicine | Admitting: Family Medicine

## 2019-07-19 DIAGNOSIS — F1120 Opioid dependence, uncomplicated: Secondary | ICD-10-CM | POA: Insufficient documentation

## 2019-07-19 MED ORDER — BUPRENORPHINE HCL-NALOXONE HCL 8-2 MG SL FILM
ORAL_FILM | SUBLINGUAL | 0 refills | Status: DC
Start: 2019-07-19 — End: 2019-08-02

## 2019-07-19 NOTE — Progress Notes (Signed)
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  Doing well overall, feels positive.    Present Medications:  nicotine (NICODERM CQ) 14 MG/24HR, APPLY 1 PATCH ONTO THE SKIN EVERY DAY, Disp: 28 patch, Rfl: 1  [DISCONTINUED] buprenorphine-naloxone (SUBOXONE) 8-2 MG sublingual film, Take two and one-half films under the tongue daily., Disp: 35 Film, Rfl: 0  estradiol (ESTRACE VAGINAL) 0.1 MG/GM vaginal cream, Place 4 g vaginally daily for 14 days, THEN 2 g daily for 14 days, THEN 2 g 3 (three) times a week for 14 days, THEN 1 g 3 (three) times a week., Disp: 42.5 g, Rfl: 5  doxepin (SINEQUAN) 10 MG capsule, Take 1-2 capsules by mouth daily  for 14 days, Disp: 28 capsule, Rfl: 0  albuterol (VENTOLIN HFA) 108 (90 Base) MCG/ACT inhaler, INHALE 2 PUFFS INTO THE LUNGS EVERY 6 HOURS AS NEEDED FOR WHEEZING OR SHORTNESS OF BREATH, Disp: 8.5 g, Rfl: 11  buPROPion (WELLBUTRIN SR) 200 MG 12 hr tablet, Take 1 tablet by mouth 2 (two) times daily, Disp: 180 tablet, Rfl: 1  ipratropium-albuterol (DUO-NEB) 0.5-2.5 (3) MG/3ML SOLN Inhalation Solution, Take 3 mLs by nebulization 4 (four) times daily., Disp: 180 mL, Rfl: 0    No current facility-administered medications on file prior to visit.      PHQ-9 TOTAL SCORE 01/12/2019 06/01/2018 03/03/2017   Doc FlowSheet Total Score - - -   Doc FlowSheet Total Score 10 6 4            PHYSICAL EXAMINATION via video:  General appearance - healthy female in no distress  Neuro - nonfocal  Affect - normal  Behavior without evidence of intoxication or impairment    PMP Reviewed.  No unauthorized prescriptions since last refill.     Assessment:  Opioid Dependence  Comment: Doing well and excited about Covid vaccine.  Recovery stable.  Plan:  Continue  buprenorphine, reviewed criteria for tapering when patient is ready. Discussed sources of support.  Patient is counseled regarding relapse prevention, involvement in recovery groups, potential side effects of buprenorphine, and eventual taper of medication.    For the patient participating in the group via telephone and/or video technology: the patient/guardian has verbally consented to participate in this group therapy session by televisit. The patient/guardian was encouraged to be in a private location due to personal health information being discussed and where they could not be overheard by individuals not participating in the group therapy. Although all [facility] staff/providers participating in this group therapy televisit are in a private location, all the risks of telephone and/or video technology used to conduct this group therapy session cannot be controlled by [place], (i.e. interruptions, unauthorized access/recording and technical difficulties).  Each patient understands that the visit can be stopped at any time for any reason, including if the conferencing connections are inadequate.       Geni Bers, MD, 07/19/2019

## 2019-07-20 ENCOUNTER — Telehealth (HOSPITAL_BASED_OUTPATIENT_CLINIC_OR_DEPARTMENT_OTHER): Payer: Self-pay | Admitting: Registered Nurse

## 2019-07-20 DIAGNOSIS — F1120 Opioid dependence, uncomplicated: Secondary | ICD-10-CM

## 2019-07-20 DIAGNOSIS — F17200 Nicotine dependence, unspecified, uncomplicated: Secondary | ICD-10-CM

## 2019-07-20 MED ORDER — INCRUSE ELLIPTA 62.5 MCG/INH IN AEPB
INHALATION_SPRAY | RESPIRATORY_TRACT | 11 refills | Status: DC
Start: 2019-07-20 — End: 2020-02-23

## 2019-07-20 MED ORDER — NICOTINE 14 MG/24HR TD PT24
MEDICATED_PATCH | TRANSDERMAL | 1 refills | Status: DC
Start: 2019-07-20 — End: 2019-07-20

## 2019-07-20 MED ORDER — NICOTINE 21 MG/24HR TD PT24
MEDICATED_PATCH | TRANSDERMAL | 1 refills | Status: DC
Start: 2019-07-20 — End: 2019-09-13

## 2019-07-20 MED ORDER — BUPROPION HCL ER (SR) 200 MG PO TB12
200.0000 mg | ORAL_TABLET | Freq: Two times a day (BID) | ORAL | 1 refills | Status: DC
Start: 2019-07-20 — End: 2020-01-12

## 2019-07-20 NOTE — Telephone Encounter (Signed)
Prescription sent electronically to preferred pharmacy.

## 2019-07-20 NOTE — Telephone Encounter (Signed)
Pt calling reporting feeling so much better since starting estradiol.  Is looking for new rx- I advised she has refills.   Pt looking for new rx for incruse- Wellbutrin, and 21 mg nicotine patch(per pt 14mg  doesn't work because she sometimes smokes on them and feels fine),   Pt reports not getting letter I sent  Will have daughter pick up today.

## 2019-07-20 NOTE — Telephone Encounter (Signed)
-----   Message from [place] sent at 07/20/2019 10:07 AM EDT -----  Regarding: Requesting a call  Contact: [phone]  [name] [id number], 64 year old, female    Calls today:  Clinical Questions ([place])    Name of person calling   Specific nature of request Patient is asking for a call back from the nurse please. Stated she wants to follow up on her discussion with Nevin Bloodgood. Thank you   Return phone number [phone]  Person calling on behalf of patient: Patient (self)    Patient's language of care: English    Patient does not need an interpreter.    Patient's PCP: [name], MD

## 2019-07-22 ENCOUNTER — Ambulatory Visit (HOSPITAL_BASED_OUTPATIENT_CLINIC_OR_DEPARTMENT_OTHER): Payer: 59

## 2019-07-27 ENCOUNTER — Telehealth (HOSPITAL_BASED_OUTPATIENT_CLINIC_OR_DEPARTMENT_OTHER): Payer: Self-pay | Admitting: Internal Medicine

## 2019-07-27 NOTE — Telephone Encounter (Signed)
Patient is out of Estradiol cream but script is refill too soon at pharmacy. Per pharmacist, patient last filled 07/23/19 but she said she already used it all and needs more. Earliest she can fill is 04/05 per pharmacist unless MD calls in a dose increase in directions. Otherwise she will have to pay out of pocket for medication. Please review with patient and make sure she is using correctly.     Thank you

## 2019-07-29 NOTE — Telephone Encounter (Signed)
I spoke to pt reports she thinks she took med correctly- followed directions- reports some was wasted in process of administration.  Reports she is ok to wait until the 5th for refill-  Reports she is taking "over the counter premarin and estrogen"  I asked pt to clarify  Pt fastly getting off of phone stating "I have an appointment with Dr. Lisa Roca on the 14th and am ok to wait until the 5th for the medication.

## 2019-08-02 ENCOUNTER — Encounter (HOSPITAL_BASED_OUTPATIENT_CLINIC_OR_DEPARTMENT_OTHER): Payer: Self-pay

## 2019-08-02 ENCOUNTER — Ambulatory Visit: Payer: 59 | Attending: Family Medicine | Admitting: Family Medicine

## 2019-08-02 DIAGNOSIS — F1120 Opioid dependence, uncomplicated: Secondary | ICD-10-CM | POA: Insufficient documentation

## 2019-08-02 MED ORDER — BUPRENORPHINE HCL-NALOXONE HCL 8-2 MG SL FILM
ORAL_FILM | SUBLINGUAL | 0 refills | Status: DC
Start: 2019-08-02 — End: 2019-08-16

## 2019-08-02 NOTE — Progress Notes (Signed)
Cheryl Dixon is a 64 year old female seen in follow up for opioid dependence:    Buprenorphine dose: 20mg   Response, adequacy of dose: good  Relapses/close calls: none  Trigger: denies  Meetings: none currently    Social history/events:  Had a good Easter, is feeling positive and upbeat.  Discussed the importance she feels about having continuity with her PCP.    Present Medications:  buPROPion (WELLBUTRIN SR) 200 MG 12 hr tablet, Take 1 tablet by mouth 2 (two) times daily, Disp: 180 tablet, Rfl: 1  umeclidinium (INCRUSE ELLIPTA) 62.5 MCG/INH inhaler, INHALE 1 PUFF BY MOUTH INTO LUNGS DAILY, Disp: 1 Inhaler, Rfl: 11  nicotine (NICODERM CQ) 21 MG/24HR, APPLY 1 PATCH TOPICALLY DAILY, Disp: 28 patch, Rfl: 1  buprenorphine-naloxone (SUBOXONE) 8-2 MG sublingual film, Take two and one-half films under the tongue daily., Disp: 35 Film, Rfl: 0  estradiol (ESTRACE VAGINAL) 0.1 MG/GM vaginal cream, Place 4 g vaginally daily for 14 days, THEN 2 g daily for 14 days, THEN 2 g 3 (three) times a week for 14 days, THEN 1 g 3 (three) times a week., Disp: 42.5 g, Rfl: 5  doxepin (SINEQUAN) 10 MG capsule, Take 1-2 capsules by mouth daily  for 14 days, Disp: 28 capsule, Rfl: 0  albuterol (VENTOLIN HFA) 108 (90 Base) MCG/ACT inhaler, INHALE 2 PUFFS INTO THE LUNGS EVERY 6 HOURS AS NEEDED FOR WHEEZING OR SHORTNESS OF BREATH, Disp: 8.5 g, Rfl: 11  ipratropium-albuterol (DUO-NEB) 0.5-2.5 (3) MG/3ML SOLN Inhalation Solution, Take 3 mLs by nebulization 4 (four) times daily., Disp: 180 mL, Rfl: 0    No current facility-administered medications on file prior to visit.      PHQ-9 TOTAL SCORE 01/12/2019 06/01/2018 03/03/2017   Doc FlowSheet Total Score - - -   Doc FlowSheet Total Score 10 6 4            PHYSICAL EXAMINATION via video:  General appearance - healthy female in no distress  Neuro - nonfocal  Affect - normal  Behavior without evidence of intoxication or impairment    PMP Reviewed.  No unauthorized prescriptions since last refill.      Assessment:  Opioid Dependence  Comment: Feeling better, good Easter--happy about how things are going currently.  Plan:  Continue buprenorphine, reviewed criteria for tapering when patient is ready. Discussed sources of support.  Patient is counseled regarding relapse prevention, involvement in recovery groups, potential side effects of buprenorphine, and eventual taper of medication.    For the patient participating in the group via telephone and/or video technology: the patient/guardian has verbally consented to participate in this group therapy session by televisit. The patient/guardian was encouraged to be in a private location due to personal health information being discussed and where they could not be overheard by individuals not participating in the group therapy. Although all [facility] staff/providers participating in this group therapy televisit are in a private location, all the risks of telephone and/or video technology used to conduct this group therapy session cannot be controlled by [facility], (i.e. interruptions, unauthorized access/recording and technical difficulties).  Each patient understands that the visit can be stopped at any time for any reason, including if the conferencing connections are inadequate.       Geni Bers, MD, 08/02/2019

## 2019-08-03 ENCOUNTER — Other Ambulatory Visit: Payer: Self-pay

## 2019-08-09 ENCOUNTER — Ambulatory Visit (HOSPITAL_BASED_OUTPATIENT_CLINIC_OR_DEPARTMENT_OTHER): Payer: 59

## 2019-08-11 ENCOUNTER — Ambulatory Visit (HOSPITAL_BASED_OUTPATIENT_CLINIC_OR_DEPARTMENT_OTHER): Payer: Self-pay | Admitting: Internal Medicine

## 2019-08-16 ENCOUNTER — Ambulatory Visit: Payer: 59 | Attending: Family Medicine | Admitting: Family Medicine

## 2019-08-16 DIAGNOSIS — F1120 Opioid dependence, uncomplicated: Secondary | ICD-10-CM | POA: Diagnosis present

## 2019-08-16 MED ORDER — BUPRENORPHINE HCL-NALOXONE HCL 8-2 MG SL FILM
ORAL_FILM | SUBLINGUAL | 0 refills | Status: DC
Start: 2019-08-16 — End: 2019-08-30

## 2019-08-16 NOTE — Progress Notes (Signed)
Cheryl Dixon is a 64 year old female seen in follow up for opioid use disorder:    Buprenorphine dose: 20mg   Response, adequacy of dose: good  Relapses/close calls: none  Trigger: denies  Meetings: none currently    Social history/events:  Doing well, staying busy, getting outside  Nothing new  Has not gotten vaccine, not sure if wants to get it    Present Medications:  buprenorphine-naloxone (SUBOXONE) 8-2 MG sublingual film, Take two and one-half films under the tongue daily., Disp: 35 Film, Rfl: 0  buPROPion (WELLBUTRIN SR) 200 MG 12 hr tablet, Take 1 tablet by mouth 2 (two) times daily, Disp: 180 tablet, Rfl: 1  umeclidinium (INCRUSE ELLIPTA) 62.5 MCG/INH inhaler, INHALE 1 PUFF BY MOUTH INTO LUNGS DAILY, Disp: 1 Inhaler, Rfl: 11  nicotine (NICODERM CQ) 21 MG/24HR, APPLY 1 PATCH TOPICALLY DAILY, Disp: 28 patch, Rfl: 1  estradiol (ESTRACE VAGINAL) 0.1 MG/GM vaginal cream, Place 4 g vaginally daily for 14 days, THEN 2 g daily for 14 days, THEN 2 g 3 (three) times a week for 14 days, THEN 1 g 3 (three) times a week., Disp: 42.5 g, Rfl: 5  doxepin (SINEQUAN) 10 MG capsule, Take 1-2 capsules by mouth daily  for 14 days, Disp: 28 capsule, Rfl: 0  albuterol (VENTOLIN HFA) 108 (90 Base) MCG/ACT inhaler, INHALE 2 PUFFS INTO THE LUNGS EVERY 6 HOURS AS NEEDED FOR WHEEZING OR SHORTNESS OF BREATH, Disp: 8.5 g, Rfl: 11  ipratropium-albuterol (DUO-NEB) 0.5-2.5 (3) MG/3ML SOLN Inhalation Solution, Take 3 mLs by nebulization 4 (four) times daily., Disp: 180 mL, Rfl: 0    No current facility-administered medications on file prior to visit.      PHQ-9 TOTAL SCORE 01/12/2019 06/01/2018 03/03/2017   Doc FlowSheet Total Score - - -   Doc FlowSheet Total Score 10 6 4            PHYSICAL EXAMINATION via video:  General appearance - healthy female in no distress  Neuro - nonfocal  Affect - normal  Behavior without evidence of intoxication or impairment    PMP reviewed. No unauthorized prescriptions since last refill.      Assessment:  Opioid Dependence  Comment: condition gradually improving on current dose, actively participating in group.  Plan:  Continue buprenorphine, reviewed criteria for tapering when patient is ready.    Patient is counseled regarding relapse prevention, involvement in recovery groups, and side effects of buprenorphine.    For the patient participating in the group via telephone and/or video technology: the patient/guardian has verbally consented to participate in this group therapy session by televisit. The patient/guardian was encouraged to be in a private location due to personal health information being discussed and where they could not be overheard by individuals not participating in the group therapy. Although all [facility] staff/providers participating in this group therapy televisit are in a private location, all the risks of telephone and/or video technology used to conduct this group therapy session cannot be controlled by [facility], (i.e. interruptions, unauthorized access/recording and technical difficulties).  Each patient understands that the visit can be stopped at any time for any reason, including if the conferencing connections are inadequate.   We discussed the patients current medications. The patient expressed understanding and no barriers to adherence were identified.  1. The patient indicates understanding of these issues and agrees with the plan.    2. I reviewed the patient's medical information and medical history   3. I reconciled the patient's medication list and prepared and  supplied needed refills.   4. I have reviewed the past medical, family, and social history sections including the medications and allergies.

## 2019-08-17 ENCOUNTER — Ambulatory Visit (INDEPENDENT_AMBULATORY_CARE_PROVIDER_SITE_OTHER): Payer: 59 | Admitting: Orthopaedic Surgery

## 2019-08-17 ENCOUNTER — Other Ambulatory Visit: Payer: Self-pay

## 2019-08-17 DIAGNOSIS — M2021 Hallux rigidus, right foot: Secondary | ICD-10-CM

## 2019-08-23 ENCOUNTER — Ambulatory Visit (INDEPENDENT_AMBULATORY_CARE_PROVIDER_SITE_OTHER): Payer: 59 | Admitting: Orthopaedic Surgery

## 2019-08-23 ENCOUNTER — Other Ambulatory Visit: Payer: Self-pay

## 2019-08-23 ENCOUNTER — Ambulatory Visit: Payer: Self-pay

## 2019-08-23 ENCOUNTER — Encounter: Payer: Self-pay | Admitting: Orthopaedic Surgery

## 2019-08-23 DIAGNOSIS — M79671 Pain in right foot: Secondary | ICD-10-CM

## 2019-08-23 NOTE — Progress Notes (Signed)
Office Visit Note   Patient: Michelle Roberts           Date of Birth: December 17, 1955           MRN: [id number] Visit Date: 08/23/2019              Requested by: Antony Contras, MD [address],  [place] [address] PCP: Antony Contras, MD   Assessment & Plan: Visit Diagnoses:  1. Pain in right foot     Plan: Impression is right foot metatarsalgia with solid right great toe fusion.  At this point, I believe is appropriate to obtain an MRI of her right foot to assess for structural abnormalities.  She will follow up with [place] once has been completed.  Follow-Up Instructions: Return for after MRI right foot.   Orders:  Orders Placed This Encounter  Procedures  . XR Foot Complete Right  . MR Foot Right w/o contrast   No orders of the defined types were placed in this encounter.     Procedures: No procedures performed   Clinical Data: No additional findings.   Subjective: Chief Complaint  Patient presents with  . Right Foot - Pain, Follow-up    HPI patient is a pleasant 64 year old female who comes in today with continued right foot pain.  She is status post right great toe fusion 02/03/2018.  She is had a fair amount of right foot pain since.  Her fusion has healed well and her great toe does not persistently bother her, but she continues to have pain to the ball of the foot worse with ambulation.  She has tried special shoes as well as several custom orthotics without relief of symptoms.  Pain she has is worse at the end of the day.  No new injury or change in activity.  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  She notes that she is unable to restart any rheumatoid arthritis medications until she is 5 years out.  Review of Systems as detailed in HPI.  All others reviewed and are negative.   Objective: Vital Signs: There were no vitals taken for this visit.  Physical  Exam well-developed well-nourished female no acute distress.  Alert and oriented x3.  Ortho Exam examination of her right foot reveals moderate tenderness throughout the entire ball of her foot.  She does have tenderness to the dorsum of the foot as well throughout all 5 metatarsals.  No specific tenderness to the great toe.  She is neurovascular intact distally  Specialty Comments:  No specialty comments available.  Imaging: No new imaging   PMFS History: Patient Active Problem List   Diagnosis Date Noted  . Hallux rigidus, right foot 02/03/2018  . 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 Fibromyalgia 08/12/2016  . High risk medication use 08/12/2016  . Primary osteoarthritis of both hands 08/12/2016  . Acute midline low back pain 08/12/2016  . DDD (degenerative disc disease), lumbar 08/12/2016  . History of hypertension 08/12/2016  . History of high cholesterol 08/12/2016  . Thyroid ca ([facility]) 08/12/2016  . History of hypothyroidism 08/12/2016  . History of gastroesophageal reflux (GERD) 08/12/2016  . History of TMJ syndrome 08/12/2016  . Burning tongue syndrome 08/12/2016  . History of vitamin D deficiency 08/12/2016  . Other sleep apnea 08/12/2016  . Vitamin D deficiency 08/12/2016  . Rheumatoid arthritis with rheumatoid factor  of multiple sites without organ or systems involvement ([facility]) 12/31/2015  . Goals of care, counseling/discussion 06/02/2013  . Myofascial muscle pain 06/02/2013  . Narcotic-induced mood disorder ([facility]) 06/02/2013  . Inflammatory arthritis 04/08/2011  . Plantar fasciitis 04/08/2011  . Yeast infection 04/08/2011   Past Medical History:  Diagnosis Date  . Anxiety   . Arthritis   . Depression   . Fibromyalgia   . GERD (gastroesophageal reflux disease)   . Hyperlipidemia   . Hypertension   . Hypothyroidism    thyroidectomy  . Neoplasm of parotid gland   . Neuromuscular disorder ([facility])   . Rheumatoid aortitis   . Sleep apnea    uses CPAP  sometimes    Family History  Problem Relation Age of Onset  . Alzheimer's disease Mother   . Alzheimer's disease Sister     Past Surgical History:  Procedure Laterality Date  . ABDOMINAL HYSTERECTOMY    . CHOLECYSTECTOMY    . HAMMER TOE SURGERY    . MASS EXCISION N/A 11/17/2017   Procedure: EXCISION SOFT PALATE LESION;  Surgeon: Rozetta Nunnery, MD;  Location: [facility];  Service: ENT;  Laterality: N/A;  . PAROTIDECTOMY Left 04/08/2016   Procedure: LEFT SUPERFICIAL PAROTIDECTOMY WITH FACIAL NERVE DISECTION;  Surgeon: Rozetta Nunnery, MD;  Location: [facility];  Service: ENT;  Laterality: Left;  . TARSAL METATARSAL ARTHRODESIS Right 02/03/2018   Procedure: RIGHT GREAT TOE  FUSION;  Surgeon: Leandrew Koyanagi, MD;  Location: [facility];  Service: Orthopedics;  Laterality: Right;  . THYROIDECTOMY     Social History   Occupational History  . Not on file  Tobacco Use  . Smoking status: Never Smoker  . Smokeless tobacco: Never Used  Substance and Sexual Activity  . Alcohol use: No  . Drug use: No  . Sexual activity: Not on file

## 2019-08-29 ENCOUNTER — Other Ambulatory Visit (HOSPITAL_BASED_OUTPATIENT_CLINIC_OR_DEPARTMENT_OTHER): Payer: Self-pay

## 2019-08-29 ENCOUNTER — Other Ambulatory Visit (HOSPITAL_BASED_OUTPATIENT_CLINIC_OR_DEPARTMENT_OTHER): Payer: Self-pay | Admitting: Internal Medicine

## 2019-08-29 ENCOUNTER — Other Ambulatory Visit: Payer: Self-pay

## 2019-08-29 DIAGNOSIS — F1120 Opioid dependence, uncomplicated: Secondary | ICD-10-CM

## 2019-08-29 DIAGNOSIS — N952 Postmenopausal atrophic vaginitis: Secondary | ICD-10-CM

## 2019-08-29 MED ORDER — ESTRADIOL 0.1 MG/GM VA CREA
TOPICAL_CREAM | VAGINAL | 5 refills | Status: DC
Start: 2019-08-29 — End: 2019-08-31

## 2019-08-29 NOTE — Telephone Encounter (Signed)
Returned call to patient, she reports she has no more refills of estrogen, reports initially med did not last a month and she ran out a while ago, reports sxs improved when taking but have returned since she ran out, reports she did check with pharmacy and has used all of the refills.    Also requesting utility letter (electric) to be faxed to [employer].  Letter written and left to be faxed.

## 2019-08-29 NOTE — Telephone Encounter (Signed)
-----   Message from [name] sent at 08/29/2019 12:39 PM EDT -----  Regarding: requesting callback from nurse regarding utility letter  Contact: [phone]  Cheryl Dixon [id number], 64 year old, female    Calls today:  Clinical Questions ([facility])    Name of person calling patient   Specific nature of request patient is requesting a call back from nurse about a utility letter and getting a refill on her estradiol prescription  Return phone number [phone]  Person calling on behalf of patient: Patient (self)        Patient's language of care: English    Patient does not need an interpreter.    Patient's PCP: [name], MD

## 2019-08-30 ENCOUNTER — Ambulatory Visit: Payer: 59 | Attending: Family Medicine | Admitting: Family Medicine

## 2019-08-30 DIAGNOSIS — F1120 Opioid dependence, uncomplicated: Secondary | ICD-10-CM | POA: Diagnosis present

## 2019-08-30 MED ORDER — BUPRENORPHINE HCL-NALOXONE HCL 8-2 MG SL FILM
ORAL_FILM | SUBLINGUAL | 0 refills | Status: DC
Start: 2019-08-30 — End: 2019-09-14

## 2019-08-30 NOTE — Progress Notes (Signed)
Cheryl Dixon is a 64 year old female seen in follow up for opioid use disorder:    Buprenorphine dose:20mg   Response, adequacy of dose: good  Relapses/close calls: none  Trigger: denies  Meetings: none currently    Social history/events:  Things are good  Started working in her garden, planting a bunch of vegetables  Nothing new otherwise  Spending time with the grandkids    Present Medications:  estradiol (ESTRACE VAGINAL) 0.1 MG/GM vaginal cream, Place 4 g vaginally daily for 14 days, THEN 2 g daily for 14 days, THEN 2 g 3 (three) times a week for 14 days, THEN 1 g 3 (three) times a week., Disp: 42.5 g, Rfl: 5  buprenorphine-naloxone (SUBOXONE) 8-2 MG sublingual film, Take two and one-half films under the tongue daily., Disp: 35 Film, Rfl: 0  buPROPion (WELLBUTRIN SR) 200 MG 12 hr tablet, Take 1 tablet by mouth 2 (two) times daily, Disp: 180 tablet, Rfl: 1  umeclidinium (INCRUSE ELLIPTA) 62.5 MCG/INH inhaler, INHALE 1 PUFF BY MOUTH INTO LUNGS DAILY, Disp: 1 Inhaler, Rfl: 11  nicotine (NICODERM CQ) 21 MG/24HR, APPLY 1 PATCH TOPICALLY DAILY, Disp: 28 patch, Rfl: 1  doxepin (SINEQUAN) 10 MG capsule, Take 1-2 capsules by mouth daily  for 14 days, Disp: 28 capsule, Rfl: 0  albuterol (VENTOLIN HFA) 108 (90 Base) MCG/ACT inhaler, INHALE 2 PUFFS INTO THE LUNGS EVERY 6 HOURS AS NEEDED FOR WHEEZING OR SHORTNESS OF BREATH, Disp: 8.5 g, Rfl: 11  ipratropium-albuterol (DUO-NEB) 0.5-2.5 (3) MG/3ML SOLN Inhalation Solution, Take 3 mLs by nebulization 4 (four) times daily., Disp: 180 mL, Rfl: 0    No current facility-administered medications on file prior to visit.      PHQ-9 TOTAL SCORE 01/12/2019 06/01/2018 03/03/2017   Doc FlowSheet Total Score - - -   Doc FlowSheet Total Score 10 6 4            PHYSICAL EXAMINATION via video:  General appearance - healthy female in no distress  Neuro - nonfocal  Affect - normal  Behavior without evidence of intoxication or impairment    PMP reviewed. No unauthorized prescriptions  since last refill.     Assessment:  Opioid Dependence  Comment: condition gradually improving on current dose, actively participating in group.  Plan:  Continue buprenorphine, reviewed criteria for tapering when patient is ready.    Patient is counseled regarding relapse prevention, involvement in recovery groups, and side effects of buprenorphine.    For the patient participating in the group via telephone and/or video technology: the patient/guardian has verbally consented to participate in this group therapy session by televisit. The patient/guardian was encouraged to be in a private location due to personal health information being discussed and where they could not be overheard by individuals not participating in the group therapy. Although all [facility] staff/providers participating in this group therapy televisit are in a private location, all the risks of telephone and/or video technology used to conduct this group therapy session cannot be controlled by [facility], (i.e. interruptions, unauthorized access/recording and technical difficulties).  Each patient understands that the visit can be stopped at any time for any reason, including if the conferencing connections are inadequate.   We discussed the patients current medications. The patient expressed understanding and no barriers to adherence were identified.  1. The patient indicates understanding of these issues and agrees with the plan.    2. I reviewed the patient's medical information and medical history   3. I reconciled the patient's medication list and  prepared and supplied needed refills.   4. I have reviewed the past medical, family, and social history sections including the medications and allergies.

## 2019-08-31 ENCOUNTER — Other Ambulatory Visit (HOSPITAL_BASED_OUTPATIENT_CLINIC_OR_DEPARTMENT_OTHER): Payer: Self-pay

## 2019-08-31 ENCOUNTER — Other Ambulatory Visit (HOSPITAL_BASED_OUTPATIENT_CLINIC_OR_DEPARTMENT_OTHER): Payer: Self-pay | Admitting: Internal Medicine

## 2019-08-31 DIAGNOSIS — N952 Postmenopausal atrophic vaginitis: Secondary | ICD-10-CM

## 2019-08-31 MED ORDER — ESTRADIOL 0.1 MG/GM VA CREA
4.00 g | TOPICAL_CREAM | VAGINAL | 5 refills | Status: AC
Start: 2019-09-01 — End: 2019-11-29

## 2019-08-31 NOTE — Telephone Encounter (Signed)
It appears pt called again today  I called and left VM that I will send in a higher quantity.  Call if that doesn't work or if the medication is not working well

## 2019-08-31 NOTE — Telephone Encounter (Signed)
See encounter dated 4/6.

## 2019-09-12 ENCOUNTER — Other Ambulatory Visit (HOSPITAL_BASED_OUTPATIENT_CLINIC_OR_DEPARTMENT_OTHER): Payer: Self-pay | Admitting: Internal Medicine

## 2019-09-13 ENCOUNTER — Ambulatory Visit (HOSPITAL_BASED_OUTPATIENT_CLINIC_OR_DEPARTMENT_OTHER): Payer: 59 | Admitting: Family Medicine

## 2019-09-13 ENCOUNTER — Ambulatory Visit: Payer: 59 | Admitting: Orthopaedic Surgery

## 2019-09-13 NOTE — Telephone Encounter (Signed)
PER Pharmacy, Cheryl Dixon is a 64 year old female has requested a refill of nicotine (NICODERM CQ) 21 MG/24HR      Last tel.Visit: 08/30/2019 with Quenton Fetter  Last Physical Exam: 05/27/2013    COLONOSCOPY due on 08/22/2017    Other Med Adult:  Most Recent BP Reading(s)  06/30/19 : (!) 142/80        Cholesterol (mg/dl)   Date Value   01/04/2010 264 (H)     LOW DENSITY LIPOPROTEIN DIRECT (mg/dl)   Date Value   01/04/2010 101 (H)     HIGH DENSITY LIPOPROTEIN (mg/dl)   Date Value   01/04/2010 38     No results found for: TG      THYROID SCREEN TSH REFLEX FT4 (uIU/mL)   Date Value   01/12/2019 1.630         No results found for: TSH    HEMOGLOBIN A1C (%)   Date Value   01/12/2019 4.6       No results found for: POCA1C      INR (no units)   Date Value   02/16/2007 1.0 (L)   07/03/2006 < 1.0 (L)       SODIUM (mmol/L)   Date Value   01/12/2019 141       POTASSIUM (mmol/L)   Date Value   01/12/2019 4.7           CREATININE (mg/dL)   Date Value   01/12/2019 1.1       Documented patient preferred pharmacies:    [id number] - [place], [place] - [place]  Phone: [phone] Fax: [phone]

## 2019-09-14 ENCOUNTER — Other Ambulatory Visit (HOSPITAL_BASED_OUTPATIENT_CLINIC_OR_DEPARTMENT_OTHER): Payer: Self-pay | Admitting: Internal Medicine

## 2019-09-14 DIAGNOSIS — F1120 Opioid dependence, uncomplicated: Secondary | ICD-10-CM

## 2019-09-14 MED ORDER — BUPRENORPHINE HCL-NALOXONE HCL 8-2 MG SL FILM
ORAL_FILM | SUBLINGUAL | 0 refills | Status: DC
Start: 2019-09-14 — End: 2019-09-27

## 2019-09-14 NOTE — Telephone Encounter (Signed)
PER Patient (self), Cheryl Dixon is a 64 year old female has requested a refill of buprenorphine-naloxone (SUBOXONE) 8-2 MG sublingual film.      Last Office Visit: 06/30/2019 with Roll, D  Last Physical Exam: 05/27/2013      Other Med Adult:  Most Recent BP Reading(s)  06/30/19 : (!) 142/80        Cholesterol (mg/dl)   Date Value   01/04/2010 264 (H)     LOW DENSITY LIPOPROTEIN DIRECT (mg/dl)   Date Value   01/04/2010 101 (H)     HIGH DENSITY LIPOPROTEIN (mg/dl)   Date Value   01/04/2010 38     No results found for: TG      THYROID SCREEN TSH REFLEX FT4 (uIU/mL)   Date Value   01/12/2019 1.630         No results found for: TSH    HEMOGLOBIN A1C (%)   Date Value   01/12/2019 4.6       No results found for: POCA1C      INR (no units)   Date Value   02/16/2007 1.0 (L)   07/03/2006 < 1.0 (L)       SODIUM (mmol/L)   Date Value   01/12/2019 141       POTASSIUM (mmol/L)   Date Value   01/12/2019 4.7           CREATININE (mg/dL)   Date Value   01/12/2019 1.1       Documented patient preferred pharmacies:    [id number] - [place], [place] - [place]  Phone: [phone] Fax: [place], [place]  Phone: [phone] Fax: [phone]

## 2019-09-14 NOTE — Telephone Encounter (Signed)
PER Patient (self),@ is a 64 year old female has requested a refill of Suboxone      Last prescribed - start date: 08/30/2019 end date: 09/13/2019     Last Office Visit: 08/30/2019  Last Physical Exam: 05/27/2013       Other Med Adult:  Most Recent BP Reading(s)  06/30/19 : (!) 142/80        Cholesterol (mg/dl)   Date Value   01/04/2010 264 (H)     LOW DENSITY LIPOPROTEIN DIRECT (mg/dl)   Date Value   01/04/2010 101 (H)     HIGH DENSITY LIPOPROTEIN (mg/dl)   Date Value   01/04/2010 38     No results found for: TG      THYROID SCREEN TSH REFLEX FT4 (uIU/mL)   Date Value   01/12/2019 1.630         No results found for: TSH    HEMOGLOBIN A1C (%)   Date Value   01/12/2019 4.6       No results found for: POCA1C      INR (no units)   Date Value   02/16/2007 1.0 (L)   07/03/2006 < 1.0 (L)       SODIUM (mmol/L)   Date Value   01/12/2019 141       POTASSIUM (mmol/L)   Date Value   01/12/2019 4.7           CREATININE (mg/dL)   Date Value   01/12/2019 1.1        Documented patient preferred pharmacies:    [id number] - [place], [place] - [place]  Phone: [phone] Fax: [phone]

## 2019-09-23 ENCOUNTER — Ambulatory Visit
Admission: RE | Admit: 2019-09-23 | Discharge: 2019-09-23 | Disposition: A | Discharge status: Home / Self Care | Payer: 59 | Source: Ambulatory Visit | Attending: Orthopaedic Surgery | Admitting: Orthopaedic Surgery

## 2019-09-23 ENCOUNTER — Other Ambulatory Visit: Payer: Self-pay

## 2019-09-23 DIAGNOSIS — M79671 Pain in right foot: Secondary | ICD-10-CM

## 2019-09-27 ENCOUNTER — Ambulatory Visit (HOSPITAL_BASED_OUTPATIENT_CLINIC_OR_DEPARTMENT_OTHER): Payer: 59 | Admitting: Family Medicine

## 2019-09-27 ENCOUNTER — Ambulatory Visit: Payer: 59 | Attending: Family Medicine | Admitting: Family Medicine

## 2019-09-27 ENCOUNTER — Ambulatory Visit: Payer: 59 | Admitting: Orthopaedic Surgery

## 2019-09-27 ENCOUNTER — Encounter: Payer: Self-pay | Admitting: Orthopaedic Surgery

## 2019-09-27 ENCOUNTER — Other Ambulatory Visit: Payer: Self-pay

## 2019-09-27 DIAGNOSIS — F1120 Opioid dependence, uncomplicated: Secondary | ICD-10-CM | POA: Insufficient documentation

## 2019-09-27 DIAGNOSIS — M79671 Pain in right foot: Secondary | ICD-10-CM

## 2019-09-27 MED ORDER — BUPRENORPHINE HCL-NALOXONE HCL 8-2 MG SL FILM
ORAL_FILM | SUBLINGUAL | 0 refills | Status: DC
Start: 2019-09-27 — End: 2019-10-11

## 2019-09-27 MED ORDER — METHYLPREDNISOLONE ACETATE 40 MG/ML IJ SUSP
40.0000 mg | INTRAMUSCULAR | Status: AC | PRN
  Administered 2019-09-27: 40 mg

## 2019-09-27 MED ORDER — LIDOCAINE HCL 1 % IJ SOLN
1.0000 mL | INTRAMUSCULAR | Status: AC | PRN
  Administered 2019-09-27: 1 mL

## 2019-09-27 MED ORDER — BUPIVACAINE HCL 0.5 % IJ SOLN
1.0000 mL | INTRAMUSCULAR | Status: AC | PRN
  Administered 2019-09-27: 1 mL

## 2019-09-27 NOTE — Progress Notes (Signed)
Cheryl Dixon is a 64 year old female seen in follow up for opioid use disorder:    Buprenorphine dose:20mg   Response, adequacy of dose: good  Relapses/close calls: none  Trigger: denies  Meetings: none currently    Social history/events:  Lost her sister-in-law over the weekend, was one of her best friends  Still working on quitting smoking - down to 1 cig/day, hard to quit the last one  Staying busy     Present Medications:  buprenorphine-naloxone (SUBOXONE) 8-2 MG sublingual film, Take two and one-half films under the tongue daily., Disp: 35 Film, Rfl: 0  nicotine (NICODERM CQ) 21 MG/24HR, APPLY 1 PATCH TOPICALLY DAILY, Disp: 28 patch, Rfl: 1  estradiol (ESTRACE VAGINAL) 0.1 MG/GM vaginal cream, Place 4 g vaginally 4 (four) times a week, Disp: 85 g, Rfl: 5  buPROPion (WELLBUTRIN SR) 200 MG 12 hr tablet, Take 1 tablet by mouth 2 (two) times daily, Disp: 180 tablet, Rfl: 1  umeclidinium (INCRUSE ELLIPTA) 62.5 MCG/INH inhaler, INHALE 1 PUFF BY MOUTH INTO LUNGS DAILY, Disp: 1 Inhaler, Rfl: 11  doxepin (SINEQUAN) 10 MG capsule, Take 1-2 capsules by mouth daily  for 14 days, Disp: 28 capsule, Rfl: 0  albuterol (VENTOLIN HFA) 108 (90 Base) MCG/ACT inhaler, INHALE 2 PUFFS INTO THE LUNGS EVERY 6 HOURS AS NEEDED FOR WHEEZING OR SHORTNESS OF BREATH, Disp: 8.5 g, Rfl: 11  ipratropium-albuterol (DUO-NEB) 0.5-2.5 (3) MG/3ML SOLN Inhalation Solution, Take 3 mLs by nebulization 4 (four) times daily., Disp: 180 mL, Rfl: 0    No current facility-administered medications on file prior to visit.      PHQ-9 TOTAL SCORE 01/12/2019 06/01/2018 03/03/2017   Doc FlowSheet Total Score - - -   Doc FlowSheet Total Score 10 6 4            PHYSICAL EXAMINATION via video:  General appearance - healthy female in no distress  Neuro - nonfocal  Affect - normal  Behavior without evidence of intoxication or impairment    Unable to review PMP - not working, no concerns    Assessment:  Opioid Dependence  Comment: condition gradually  improving on current dose, actively participating in group.  Plan:  Continue buprenorphine, reviewed criteria for tapering when patient is ready.      Patient is counseled regarding relapse prevention, involvement in recovery groups, and side effects of buprenorphine.    For the patient participating in the group via telephone and/or video technology: the patient/guardian has verbally consented to participate in this group therapy session by televisit. The patient/guardian was encouraged to be in a private location due to personal health information being discussed and where they could not be overheard by individuals not participating in the group therapy. Although all [facility] staff/providers participating in this group therapy televisit are in a private location, all the risks of telephone and/or video technology used to conduct this group therapy session cannot be controlled by [place], (i.e. interruptions, unauthorized access/recording and technical difficulties).  Each patient understands that the visit can be stopped at any time for any reason, including if the conferencing connections are inadequate.   We discussed the patients current medications. The patient expressed understanding and no barriers to adherence were identified.  1. The patient indicates understanding of these issues and agrees with the plan.    2. I reviewed the patient's medical information and medical history   3. I reconciled the patient's medication list and prepared and supplied needed refills.   4. I have reviewed the past medical,  family, and social history sections including the medications and allergies.

## 2019-09-27 NOTE — Progress Notes (Signed)
Office Visit Note   Patient: Michelle Roberts           Date of Birth: [birth year]           MRN: [id number] Visit Date: 09/27/2019              Requested by: Antony Contras, MD [place],  [place] [id number] PCP: Antony Contras, MD   Assessment & Plan: Visit Diagnoses:  1. Pain in right foot     Plan: MRI shows increased intensity in the soft tissues just plantar to the second metatarsal.  No evidence of stress fracture.  Great toe MTP fusion is solid.  Impression is transfer metatarsalgia of the second ray.  I have recommended [facility] to see if they can offload the second metatarsal.  She also agreed to trying a cortisone injection today.  Patient tolerated this well.  We will see her back as needed.  Follow-Up Instructions: Return if symptoms worsen or fail to improve.   Orders:  No orders of the defined types were placed in this encounter.  No orders of the defined types were placed in this encounter.     Procedures: Foot Inj  Date/Time: 09/27/2019 9:40 AM Performed by: Leandrew Koyanagi, MD Authorized by: Leandrew Koyanagi, MD   Indications:  Pain Condition: Morton's Neuroma   Location:  R foot Needle Size:  25 G Approach:  Plantar Medications:  1 mL lidocaine 1 %; 40 mg methylPREDNISolone acetate 40 MG/ML; 1 mL bupivacaine 0.5 %     Clinical Data: No additional findings.   Subjective: Chief Complaint  Patient presents with  . Right Foot - Pain    Michelle Roberts is here for MRI review.  She continues to have tenderness under the second metatarsal head.  She has tried couple of orthotics at [employer] and [employer] and they have not helped tremendously.   Review of Systems  Constitutional: Negative.   HENT: Negative.   Eyes: Negative.   Respiratory: Negative.   Cardiovascular: Negative.   Endocrine: Negative.   Musculoskeletal: Negative.   Neurological: Negative.   Hematological: Negative.   Psychiatric/Behavioral: Negative.     All other systems reviewed and are negative.    Objective: Vital Signs: There were no vitals taken for this visit.  Physical Exam Vitals and nursing note reviewed.  Constitutional:      Appearance: She is well-developed.  Pulmonary:     Effort: Pulmonary effort is normal.  Skin:    General: Skin is warm.     Capillary Refill: Capillary refill takes less than 2 seconds.  Neurological:     Mental Status: She is alert and oriented to person, place, and time.  Psychiatric:        Behavior: Behavior normal.        Thought Content: Thought content normal.        Judgment: Judgment normal.     Ortho Exam Right foot shows fully healed surgical scar.  No pain with movement of the great toe.  She has tenderness directly under the second metatarsal and in the first webspace.  There is no swelling or lesions or masses Specialty Comments:  No specialty comments available.  Imaging: No results found.   PMFS History: Patient Active Problem List   Diagnosis Date Noted  . Hallux rigidus, right foot 02/03/2018  . 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 Fibromyalgia 08/12/2016  . High risk medication use 08/12/2016  .  Primary osteoarthritis of both hands 08/12/2016  . Acute midline low back pain 08/12/2016  . DDD (degenerative disc disease), lumbar 08/12/2016  . History of hypertension 08/12/2016  . History of high cholesterol 08/12/2016  . Thyroid ca ([place]) 08/12/2016  . History of hypothyroidism 08/12/2016  . History of gastroesophageal reflux (GERD) 08/12/2016  . History of TMJ syndrome 08/12/2016  . Burning tongue syndrome 08/12/2016  . History of vitamin D deficiency 08/12/2016  . Other sleep apnea 08/12/2016  . Vitamin D deficiency 08/12/2016  . Rheumatoid arthritis with rheumatoid factor of multiple sites without organ or systems involvement ([place] [facility]) 12/31/2015  . Goals of care, counseling/discussion 06/02/2013  . Myofascial muscle pain 06/02/2013  . Narcotic-induced  mood disorder ([facility]) 06/02/2013  . Inflammatory arthritis 04/08/2011  . Plantar fasciitis 04/08/2011  . Yeast infection 04/08/2011   Past Medical History:  Diagnosis Date  . Anxiety   . Arthritis   . Depression   . Fibromyalgia   . GERD (gastroesophageal reflux disease)   . Hyperlipidemia   . Hypertension   . Hypothyroidism    thyroidectomy  . Neoplasm of parotid gland   . Neuromuscular disorder ([facility])   . Rheumatoid aortitis   . Sleep apnea    uses CPAP sometimes    Family History  Problem Relation Age of Onset  . Alzheimer's disease Mother   . Alzheimer's disease Sister     Past Surgical History:  Procedure Laterality Date  . ABDOMINAL HYSTERECTOMY    . CHOLECYSTECTOMY    . HAMMER TOE SURGERY    . MASS EXCISION N/A 11/17/2017   Procedure: EXCISION SOFT PALATE LESION;  Surgeon: Rozetta Nunnery, MD;  Location: [facility];  Service: ENT;  Laterality: N/A;  . PAROTIDECTOMY Left 04/08/2016   Procedure: LEFT SUPERFICIAL PAROTIDECTOMY WITH FACIAL NERVE DISECTION;  Surgeon: Rozetta Nunnery, MD;  Location: [facility];  Service: ENT;  Laterality: Left;  . TARSAL METATARSAL ARTHRODESIS Right 02/03/2018   Procedure: RIGHT GREAT TOE  FUSION;  Surgeon: Leandrew Koyanagi, MD;  Location: [facility];  Service: Orthopedics;  Laterality: Right;  . THYROIDECTOMY     Social History   Occupational History  . Not on file  Tobacco Use  . Smoking status: Never Smoker  . Smokeless tobacco: Never Used  Substance and Sexual Activity  . Alcohol use: No  . Drug use: No  . Sexual activity: Not on file

## 2019-10-03 ENCOUNTER — Other Ambulatory Visit: Payer: Self-pay | Admitting: Obstetrics and Gynecology

## 2019-10-03 DIAGNOSIS — R928 Other abnormal and inconclusive findings on diagnostic imaging of breast: Secondary | ICD-10-CM

## 2019-10-07 ENCOUNTER — Other Ambulatory Visit: Payer: Self-pay | Admitting: Obstetrics and Gynecology

## 2019-10-07 ENCOUNTER — Other Ambulatory Visit: Payer: Self-pay

## 2019-10-07 ENCOUNTER — Ambulatory Visit
Admission: RE | Admit: 2019-10-07 | Discharge: 2019-10-07 | Disposition: A | Discharge status: Home / Self Care | Payer: 59 | Source: Ambulatory Visit | Attending: Obstetrics and Gynecology | Admitting: Obstetrics and Gynecology

## 2019-10-07 DIAGNOSIS — R921 Mammographic calcification found on diagnostic imaging of breast: Secondary | ICD-10-CM

## 2019-10-07 DIAGNOSIS — R928 Other abnormal and inconclusive findings on diagnostic imaging of breast: Secondary | ICD-10-CM

## 2019-10-10 ENCOUNTER — Other Ambulatory Visit: Payer: Self-pay

## 2019-10-10 ENCOUNTER — Ambulatory Visit
Admission: RE | Admit: 2019-10-10 | Discharge: 2019-10-10 | Disposition: A | Discharge status: Home / Self Care | Payer: 59 | Source: Ambulatory Visit | Attending: Obstetrics and Gynecology | Admitting: Obstetrics and Gynecology

## 2019-10-10 DIAGNOSIS — R921 Mammographic calcification found on diagnostic imaging of breast: Secondary | ICD-10-CM

## 2019-10-11 ENCOUNTER — Ambulatory Visit (HOSPITAL_BASED_OUTPATIENT_CLINIC_OR_DEPARTMENT_OTHER): Payer: 59 | Admitting: Family Medicine

## 2019-10-11 ENCOUNTER — Other Ambulatory Visit (HOSPITAL_BASED_OUTPATIENT_CLINIC_OR_DEPARTMENT_OTHER): Payer: Self-pay | Admitting: Family Medicine

## 2019-10-11 DIAGNOSIS — F1120 Opioid dependence, uncomplicated: Secondary | ICD-10-CM

## 2019-10-11 MED ORDER — BUPRENORPHINE HCL-NALOXONE HCL 8-2 MG SL FILM
ORAL_FILM | SUBLINGUAL | 0 refills | Status: DC
Start: 2019-10-11 — End: 2019-10-25

## 2019-10-13 ENCOUNTER — Telehealth (HOSPITAL_BASED_OUTPATIENT_CLINIC_OR_DEPARTMENT_OTHER): Payer: Self-pay | Admitting: Registered Nurse

## 2019-10-13 DIAGNOSIS — Z6379 Other stressful life events affecting family and household: Secondary | ICD-10-CM

## 2019-10-13 DIAGNOSIS — F332 Major depressive disorder, recurrent severe without psychotic features: Secondary | ICD-10-CM

## 2019-10-13 DIAGNOSIS — Z8659 Personal history of other mental and behavioral disorders: Secondary | ICD-10-CM

## 2019-10-13 NOTE — Addendum Note (Signed)
Addended by: Magalene Mclear on: 10/13/2019 06:03 PM     Modules accepted: Orders

## 2019-10-13 NOTE — Telephone Encounter (Signed)
Spoke with Cheryl Dixon in regards to a troubling text message that I received from her.  She is having an increased amount of stress this past week as her son has been arrested and is in jail.  Also talking about increased stress with her daughter and grandchildren which has been on going.  She is asking for an asap therapy consult.  "Not for meds, just for someone to talk to".  I advised her that I would speak with the team about her, but also suggested that she go to [facility] in [place], as she lives across the street.  She agreed to go in the morning while she waits for our consult.    Will continue to follow.

## 2019-10-13 NOTE — Telephone Encounter (Signed)
Referral done  [facility] will be a couple of months probably  [facility] may be more accessible

## 2019-10-25 ENCOUNTER — Ambulatory Visit: Payer: 59 | Attending: Family Medicine | Admitting: Family Medicine

## 2019-10-25 DIAGNOSIS — F1120 Opioid dependence, uncomplicated: Secondary | ICD-10-CM | POA: Diagnosis present

## 2019-10-25 MED ORDER — BUPRENORPHINE HCL-NALOXONE HCL 8-2 MG SL FILM
ORAL_FILM | SUBLINGUAL | 0 refills | Status: DC
Start: 2019-10-25 — End: 2019-11-08

## 2019-10-25 NOTE — Progress Notes (Signed)
Cheryl Dixon is a 64 year old female seen in follow up for opioid use disorder:    Buprenorphine dose:20mg   Response, adequacy of dose: good  Relapses/close calls: none  Trigger: denies  Meetings: none currently    Social history/events:  Feeling okay  Completed COVID vaccine, now feeling much better about going out  No cravings  Does feel like suboxone at this point is more about helping pain    Present Medications:  buprenorphine-naloxone (SUBOXONE) 8-2 MG sublingual film, Take two and one-half films under the tongue daily., Disp: 35 Film, Rfl: 0  nicotine (NICODERM CQ) 21 MG/24HR, APPLY 1 PATCH TOPICALLY DAILY, Disp: 28 patch, Rfl: 1  estradiol (ESTRACE VAGINAL) 0.1 MG/GM vaginal cream, Place 4 g vaginally 4 (four) times a week, Disp: 85 g, Rfl: 5  buPROPion (WELLBUTRIN SR) 200 MG 12 hr tablet, Take 1 tablet by mouth 2 (two) times daily, Disp: 180 tablet, Rfl: 1  umeclidinium (INCRUSE ELLIPTA) 62.5 MCG/INH inhaler, INHALE 1 PUFF BY MOUTH INTO LUNGS DAILY, Disp: 1 Inhaler, Rfl: 11  doxepin (SINEQUAN) 10 MG capsule, Take 1-2 capsules by mouth daily  for 14 days, Disp: 28 capsule, Rfl: 0  albuterol (VENTOLIN HFA) 108 (90 Base) MCG/ACT inhaler, INHALE 2 PUFFS INTO THE LUNGS EVERY 6 HOURS AS NEEDED FOR WHEEZING OR SHORTNESS OF BREATH, Disp: 8.5 g, Rfl: 11  ipratropium-albuterol (DUO-NEB) 0.5-2.5 (3) MG/3ML SOLN Inhalation Solution, Take 3 mLs by nebulization 4 (four) times daily., Disp: 180 mL, Rfl: 0    No current facility-administered medications on file prior to visit.      PHQ-9 TOTAL SCORE 01/12/2019 06/01/2018 03/03/2017   Doc FlowSheet Total Score - - -   Doc FlowSheet Total Score 10 6 4            PHYSICAL EXAMINATION via video:  General appearance - healthy female in no distress  Neuro - nonfocal  Affect - normal  Behavior without evidence of intoxication or impairment    PMP reviewed. No unauthorized prescriptions since last refill.     Assessment:  Opioid Dependence  Comment: condition gradually  improving on current dose, actively participating in group.  Plan:  Continue buprenorphine, reviewed criteria for tapering when patient is ready.    Patient is counseled regarding relapse prevention, involvement in recovery groups, and side effects of buprenorphine.    For the patient participating in the group via telephone and/or video technology: the patient/guardian has verbally consented to participate in this group therapy session by televisit. The patient/guardian was encouraged to be in a private location due to personal health information being discussed and where they could not be overheard by individuals not participating in the group therapy. Although all [facility] staff/providers participating in this group therapy televisit are in a private location, all the risks of telephone and/or video technology used to conduct this group therapy session cannot be controlled by [facility], (i.e. interruptions, unauthorized access/recording and technical difficulties).  Each patient understands that the visit can be stopped at any time for any reason, including if the conferencing connections are inadequate.   We discussed the patients current medications. The patient expressed understanding and no barriers to adherence were identified.  1. The patient indicates understanding of these issues and agrees with the plan.    2. I reviewed the patient's medical information and medical history   3. I reconciled the patient's medication list and prepared and supplied needed refills.   4. I have reviewed the past medical, family, and social history sections including  the medications and allergies.

## 2019-11-07 ENCOUNTER — Other Ambulatory Visit (HOSPITAL_BASED_OUTPATIENT_CLINIC_OR_DEPARTMENT_OTHER): Payer: Self-pay | Admitting: Internal Medicine

## 2019-11-07 NOTE — Telephone Encounter (Signed)
[facility] Pharm[facility], Cheryl Dixon is a 64 year old female has requested a refill of NICOTINE PATCH.      Last Office Visit: 10/25/19 with Quenton Fetter  Last Physical Exam: 05/27/2013    COLONOSCOPY due on 08/22/2017    Other Med Adult:  Most Recent BP Reading(s)  06/30/19 : (!) 142/80        Cholesterol (mg/dl)   Date Value   01/04/2010 264 (H)     LOW DENSITY LIPOPROTEIN DIRECT (mg/dl)   Date Value   01/04/2010 101 (H)     HIGH DENSITY LIPOPROTEIN (mg/dl)   Date Value   01/04/2010 38     No results found for: TG      THYROID SCREEN TSH REFLEX FT4 (uIU/mL)   Date Value   01/12/2019 1.630         No results found for: TSH    HEMOGLOBIN A1C (%)   Date Value   01/12/2019 4.6       No results found for: POCA1C      INR (no units)   Date Value   02/16/2007 1.0 (L)   07/03/2006 < 1.0 (L)       SODIUM (mmol/L)   Date Value   01/12/2019 141       POTASSIUM (mmol/L)   Date Value   01/12/2019 4.7           CREATININE (mg/dL)   Date Value   01/12/2019 1.1       Documented patient preferred pharmacies:    [id number] - [place], [place] - [address]  Phone: [phone] Fax: [phone]

## 2019-11-08 ENCOUNTER — Ambulatory Visit: Payer: 59 | Attending: Family Medicine | Admitting: Family Medicine

## 2019-11-08 DIAGNOSIS — F1120 Opioid dependence, uncomplicated: Secondary | ICD-10-CM

## 2019-11-08 MED ORDER — BUPRENORPHINE HCL-NALOXONE HCL 8-2 MG SL FILM
ORAL_FILM | SUBLINGUAL | 0 refills | Status: DC
Start: 2019-11-08 — End: 2019-11-22

## 2019-11-10 NOTE — Progress Notes (Signed)
Cheryl Dixon is a 64 year old female seen in follow up for opioid use disorder:    Buprenorphine dose:20mg   Response, adequacy of dose: good  Relapses/close calls: none  Trigger: denies  Meetings: none currently    Social history/events:  Nothing new, feeling good  Feels like cannot complain about anything right now, which is a good thing    Present Medications:  nicotine (NICODERM CQ) 21 MG/24HR, APPLY 1 PATCH TOPICALLY DAILY, Disp: 28 patch, Rfl: 1  estradiol (ESTRACE VAGINAL) 0.1 MG/GM vaginal cream, Place 4 g vaginally 4 (four) times a week, Disp: 85 g, Rfl: 5  buPROPion (WELLBUTRIN SR) 200 MG 12 hr tablet, Take 1 tablet by mouth 2 (two) times daily, Disp: 180 tablet, Rfl: 1  umeclidinium (INCRUSE ELLIPTA) 62.5 MCG/INH inhaler, INHALE 1 PUFF BY MOUTH INTO LUNGS DAILY, Disp: 1 Inhaler, Rfl: 11  doxepin (SINEQUAN) 10 MG capsule, Take 1-2 capsules by mouth daily  for 14 days, Disp: 28 capsule, Rfl: 0  albuterol (VENTOLIN HFA) 108 (90 Base) MCG/ACT inhaler, INHALE 2 PUFFS INTO THE LUNGS EVERY 6 HOURS AS NEEDED FOR WHEEZING OR SHORTNESS OF BREATH, Disp: 8.5 g, Rfl: 11  ipratropium-albuterol (DUO-NEB) 0.5-2.5 (3) MG/3ML SOLN Inhalation Solution, Take 3 mLs by nebulization 4 (four) times daily., Disp: 180 mL, Rfl: 0    No current facility-administered medications on file prior to visit.      PHQ-9 TOTAL SCORE 01/12/2019 06/01/2018 03/03/2017   Doc FlowSheet Total Score - - -   Doc FlowSheet Total Score 10 6 4            PHYSICAL EXAMINATION via video:  General appearance - healthy female in no distress  Neuro - nonfocal  Affect - normal  Behavior without evidence of intoxication or impairment    PMP reviewed. No unauthorized prescriptions since last refill.     Assessment:  Opioid Dependence  Comment: condition gradually improving on current dose, actively participating in group.  Plan:  Continue buprenorphine, reviewed criteria for tapering when patient is ready.    Patient is counseled regarding relapse  prevention, involvement in recovery groups, and side effects of buprenorphine.    For the patient participating in the group via telephone and/or video technology: the patient/guardian has verbally consented to participate in this group therapy session by televisit. The patient/guardian was encouraged to be in a private location due to personal health information being discussed and where they could not be overheard by individuals not participating in the group therapy. Although all [facility] staff/providers participating in this group therapy televisit are in a private location, all the risks of telephone and/or video technology used to conduct this group therapy session cannot be controlled by [facility], (i.e. interruptions, unauthorized access/recording and technical difficulties).  Each patient understands that the visit can be stopped at any time for any reason, including if the conferencing connections are inadequate.   We discussed the patients current medications. The patient expressed understanding and no barriers to adherence were identified.  1. The patient indicates understanding of these issues and agrees with the plan.    2. I reviewed the patient's medical information and medical history   3. I reconciled the patient's medication list and prepared and supplied needed refills.   4. I have reviewed the past medical, family, and social history sections including the medications and allergies.

## 2019-11-22 ENCOUNTER — Encounter (HOSPITAL_BASED_OUTPATIENT_CLINIC_OR_DEPARTMENT_OTHER): Payer: Self-pay | Admitting: Internal Medicine

## 2019-11-22 ENCOUNTER — Ambulatory Visit: Payer: 59 | Attending: Internal Medicine | Admitting: Internal Medicine

## 2019-11-22 DIAGNOSIS — F1120 Opioid dependence, uncomplicated: Secondary | ICD-10-CM | POA: Diagnosis present

## 2019-11-22 MED ORDER — BUPRENORPHINE HCL-NALOXONE HCL 8-2 MG SL FILM
ORAL_FILM | SUBLINGUAL | 0 refills | Status: DC
Start: 2019-11-22 — End: 2019-12-06

## 2019-11-22 NOTE — Progress Notes (Signed)
Cheryl Dixon is a 64 year old female seen in follow up for opioid use disorder:    Buprenorphine dose: 20 mg  Response, adequacy of dose: good  Relapses/close calls: none  Trigger: n/a  Meetings: no    Social history/events:  Sleep has been very inconsistent  Otherwise doing well  [place] grandson is turning 1 and they will have a party for him    Present Medications:  buprenorphine-naloxone (SUBOXONE) 8-2 MG sublingual film, Take two and one-half films under the tongue daily., Disp: 35 Film, Rfl: 0  nicotine (NICODERM CQ) 21 MG/24HR, APPLY 1 PATCH TOPICALLY DAILY, Disp: 28 patch, Rfl: 1  estradiol (ESTRACE VAGINAL) 0.1 MG/GM vaginal cream, Place 4 g vaginally 4 (four) times a week, Disp: 85 g, Rfl: 5  buPROPion (WELLBUTRIN SR) 200 MG 12 hr tablet, Take 1 tablet by mouth 2 (two) times daily, Disp: 180 tablet, Rfl: 1  umeclidinium (INCRUSE ELLIPTA) 62.5 MCG/INH inhaler, INHALE 1 PUFF BY MOUTH INTO LUNGS DAILY, Disp: 1 Inhaler, Rfl: 11  doxepin (SINEQUAN) 10 MG capsule, Take 1-2 capsules by mouth daily  for 14 days, Disp: 28 capsule, Rfl: 0  albuterol (VENTOLIN HFA) 108 (90 Base) MCG/ACT inhaler, INHALE 2 PUFFS INTO THE LUNGS EVERY 6 HOURS AS NEEDED FOR WHEEZING OR SHORTNESS OF BREATH, Disp: 8.5 g, Rfl: 11  ipratropium-albuterol (DUO-NEB) 0.5-2.5 (3) MG/3ML SOLN Inhalation Solution, Take 3 mLs by nebulization 4 (four) times daily., Disp: 180 mL, Rfl: 0    No current facility-administered medications on file prior to visit.      PHQ-9 TOTAL SCORE 01/12/2019 06/01/2018 03/03/2017   Doc FlowSheet Total Score - - -   Doc FlowSheet Total Score 10 6 4        PHYSICAL EXAMINATION via video:  General appearance - healthy female in no distress  Neuro - nonfocal  Affect - normal  Behavior without evidence of intoxication or impairment    PMP reviewed.  No unauthorized prescriptions since last refill.    Assessment:  Opioid use disorder  Comment:  Stable on buprenorphine.  Actively participated in a psychoeducational group on relapse  prevention, in particular anxiety as a trigger and techniques for managing it such as deep breathing.  Plan:  Continue buprenorphine     For the patient participating in the group via telephone and/or video technology: the patient/guardian has verbally consented to participate in this group therapy session by televisit. The patient/guardian was encouraged to be in a private location due to personal health information being discussed and where they could not be overheard by individuals not participating in the group therapy. Although all [facility] staff/providers participating in this group therapy televisit are in a private location, all the risks of telephone and/or video technology used to conduct this group therapy session cannot be controlled by [facility], (i.e. interruptions, unauthorized access/recording and technical difficulties).  Each patient understands that the visit can be stopped at any time for any reason, including if the conferencing connections are inadequate.   We discussed the patients current medications. The patient expressed understanding and no barriers to adherence were identified.  1. The patient indicates understanding of these issues and agrees with the plan.    2. I reviewed the patient's medical information and medical history   3. I reconciled the patient's medication list and prepared and supplied needed refills.   4. I have reviewed the past medical, family, and social history sections including the medications and allergies.    [name], MD

## 2019-12-05 ENCOUNTER — Encounter (HOSPITAL_BASED_OUTPATIENT_CLINIC_OR_DEPARTMENT_OTHER): Payer: Self-pay | Admitting: Physician Assistant

## 2019-12-05 ENCOUNTER — Ambulatory Visit: Payer: 59 | Attending: Internal Medicine | Admitting: Physician Assistant

## 2019-12-05 DIAGNOSIS — Z1231 Encounter for screening mammogram for malignant neoplasm of breast: Secondary | ICD-10-CM | POA: Diagnosis present

## 2019-12-05 DIAGNOSIS — M25561 Pain in right knee: Secondary | ICD-10-CM | POA: Insufficient documentation

## 2019-12-05 DIAGNOSIS — D126 Benign neoplasm of colon, unspecified: Secondary | ICD-10-CM | POA: Diagnosis present

## 2019-12-05 DIAGNOSIS — Z1211 Encounter for screening for malignant neoplasm of colon: Secondary | ICD-10-CM

## 2019-12-05 DIAGNOSIS — G8929 Other chronic pain: Secondary | ICD-10-CM | POA: Diagnosis not present

## 2019-12-05 NOTE — Progress Notes (Signed)
Cheryl Dixon is a 64 year old female pt of Dr. [name], MD who presents for evaluation of knee pain         Knee pain   R knee for many years   Getting worse   Dead center of knee cap   Some pain in hip as well at times  States she has had cortisone many times in past  Last injection worked for 5 years per pt  Taking ibuprofen, some help but not much   Was referred to ortho last year but was unable to keep appt           Patient Active Problem List:     Opioid dependence on agonist therapy ([place])     Tobacco use disorder     Fibrocystic breast     Elevated BP     Knee pain     Chronic low back pain     OA (osteoarthritis) of knee     H. pylori infection     Major depressive disorder, recurrent episode, severe (HCC)     Occult blood in stools     Prediabetes     Epigastric pain     COPD with exacerbation (HCC)     Chronic obstructive pulmonary disease (HCC)     Left foot pain     Adenomatous polyp of colon     Acquired deformity of toe, right     Dislocation of metatarsophalangeal joint of lesser toe, right, sequela     Postmenopausal atrophic vaginitis     Abnormal loss of weight      buprenorphine-naloxone (SUBOXONE) 8-2 MG sublingual film, Take two and one-half films under the tongue daily., Disp: 35 Film, Rfl: 0  nicotine (NICODERM CQ) 21 MG/24HR, APPLY 1 PATCH TOPICALLY DAILY, Disp: 28 patch, Rfl: 1  buPROPion (WELLBUTRIN SR) 200 MG 12 hr tablet, Take 1 tablet by mouth 2 (two) times daily, Disp: 180 tablet, Rfl: 1  umeclidinium (INCRUSE ELLIPTA) 62.5 MCG/INH inhaler, INHALE 1 PUFF BY MOUTH INTO LUNGS DAILY, Disp: 1 Inhaler, Rfl: 11  doxepin (SINEQUAN) 10 MG capsule, Take 1-2 capsules by mouth daily  for 14 days, Disp: 28 capsule, Rfl: 0  albuterol (VENTOLIN HFA) 108 (90 Base) MCG/ACT inhaler, INHALE 2 PUFFS INTO THE LUNGS EVERY 6 HOURS AS NEEDED FOR WHEEZING OR SHORTNESS OF BREATH, Disp: 8.5 g, Rfl: 11  ipratropium-albuterol (DUO-NEB) 0.5-2.5 (3) MG/3ML SOLN Inhalation Solution, Take 3 mLs by nebulization 4  (four) times daily., Disp: 180 mL, Rfl: 0    No current facility-administered medications on file prior to visit.        Review of Patient's Allergies indicates:   Darvon                     Meperidine hcl              Comment:Nausea/vomit   Paxil [paroxetine]      Rash   Zoloft [sertraline *    Rash          Exam   pulm - speaking full sentences           ASSESSMENT/PLAN          (M25.561,  G89.29) Chronic pain of right knee  (primary encounter diagnosis)  Comment: Ongoing knee pain for years, sounds likely due to OA. States she had good relief with injection in the past, will refer to ortho to consider repeat injection   Plan: REFERRAL TO ORTHOPEDICS ( INT)              (  Z12.11) Encounter for screening colonoscopy  (D12.6) Adenomatous polyp of colon, unspecified part of colon  Plan: COLONOSCOPY            (Z12.31) Encounter for screening mammogram for malignant neoplasm of breast  Plan: [place] SCREENING MAMMO BILATERAL DIGITAL WITH DBT & CAD

## 2019-12-06 ENCOUNTER — Encounter (HOSPITAL_BASED_OUTPATIENT_CLINIC_OR_DEPARTMENT_OTHER): Payer: Self-pay

## 2019-12-06 ENCOUNTER — Other Ambulatory Visit (HOSPITAL_BASED_OUTPATIENT_CLINIC_OR_DEPARTMENT_OTHER): Payer: Self-pay | Admitting: Internal Medicine

## 2019-12-06 ENCOUNTER — Ambulatory Visit: Payer: 59 | Attending: Family Medicine | Admitting: Family Medicine

## 2019-12-06 DIAGNOSIS — F1120 Opioid dependence, uncomplicated: Secondary | ICD-10-CM | POA: Diagnosis present

## 2019-12-06 DIAGNOSIS — Z638 Other specified problems related to primary support group: Secondary | ICD-10-CM | POA: Insufficient documentation

## 2019-12-06 DIAGNOSIS — F332 Major depressive disorder, recurrent severe without psychotic features: Secondary | ICD-10-CM | POA: Insufficient documentation

## 2019-12-06 MED ORDER — POLYETHYLENE GLYCOL 3350 17 G PO PACK
PACK | ORAL | 2 refills | Status: DC
Start: 2019-12-06 — End: 2020-05-25

## 2019-12-06 MED ORDER — BUPRENORPHINE HCL-NALOXONE HCL 8-2 MG SL FILM
ORAL_FILM | SUBLINGUAL | 0 refills | Status: DC
Start: 2019-12-06 — End: 2019-12-20

## 2019-12-06 MED ORDER — NAPROXEN 500 MG PO TABS
ORAL_TABLET | ORAL | 0 refills | Status: DC
Start: 2019-12-06 — End: 2020-05-10

## 2019-12-06 NOTE — Telephone Encounter (Signed)
PER Patient (self), Cheryl Dixon is a 64 year old female has requested a refill of     Miralax     Naproxen        Last Office Visit: 12/05/19   with Adonis Housekeeper  Last Physical Exam: 05/27/2013     COLONOSCOPY due on 08/22/2017     Other Med Adult:  Most Recent BP Reading(s)  06/30/19 : (!) 142/80        Cholesterol (mg/dl)   Date Value   01/04/2010 264 (H)     LOW DENSITY LIPOPROTEIN DIRECT (mg/dl)   Date Value   01/04/2010 101 (H)     HIGH DENSITY LIPOPROTEIN (mg/dl)   Date Value   01/04/2010 38     No results found for: TG      THYROID SCREEN TSH REFLEX FT4 (uIU/mL)   Date Value   01/12/2019 1.630         No results found for: TSH    HEMOGLOBIN A1C (%)   Date Value   01/12/2019 4.6       No results found for: POCA1C      INR (no units)   Date Value   02/16/2007 1.0 (L)   07/03/2006 < 1.0 (L)       SODIUM (mmol/L)   Date Value   01/12/2019 141       POTASSIUM (mmol/L)   Date Value   01/12/2019 4.7           CREATININE (mg/dL)   Date Value   01/12/2019 1.1        Documented patient preferred pharmacies:    [id number] [place], [place] [facility] [place]  Phone: [phone] Fax: [phone]
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
## 2019-12-09 ENCOUNTER — Telehealth (HOSPITAL_BASED_OUTPATIENT_CLINIC_OR_DEPARTMENT_OTHER): Payer: Self-pay | Admitting: Registered Nurse

## 2019-12-09 MED ORDER — TIOTROPIUM BROMIDE MONOHYDRATE 18 MCG IN CAPS
18.0000 ug | ORAL_CAPSULE | Freq: Every day | RESPIRATORY_TRACT | 5 refills | Status: DC
Start: 2019-12-09 — End: 2020-03-08

## 2019-12-09 NOTE — Telephone Encounter (Signed)
-----   Message from Marjorie Smolder sent at 12/09/2019  4:27 PM EDT -----  Regarding: Alternative  Contact: [place] [phone], 64 year old, female    Calls today:  Clinical Questions ([place])    Name of person calling Patient  Specific nature of request Incruse Cheryl Dixon is no longer cover - insurance prefers Spiriva. Please send new script if change is appropriate.   Return phone number [phone]  Person calling on behalf of patient: Patient (self)        Patient's language of care: English    Patient does not need an interpreter.    Patient's PCP: [name], MD

## 2019-12-09 NOTE — Telephone Encounter (Signed)
Pt reports insurance prefers spiriva to incruse  Would like rx sent, with heat she has needed inhaler a bit more.

## 2019-12-12 ENCOUNTER — Other Ambulatory Visit (HOSPITAL_BASED_OUTPATIENT_CLINIC_OR_DEPARTMENT_OTHER): Payer: Self-pay

## 2019-12-12 DIAGNOSIS — M25561 Pain in right knee: Secondary | ICD-10-CM

## 2019-12-13 ENCOUNTER — Encounter (HOSPITAL_BASED_OUTPATIENT_CLINIC_OR_DEPARTMENT_OTHER): Payer: Self-pay | Admitting: Internal Medicine

## 2019-12-13 ENCOUNTER — Telehealth (HOSPITAL_BASED_OUTPATIENT_CLINIC_OR_DEPARTMENT_OTHER): Payer: Self-pay | Admitting: Internal Medicine

## 2019-12-13 NOTE — Telephone Encounter (Signed)
Spoke to pt- aware utility letter being mail

## 2019-12-13 NOTE — Telephone Encounter (Signed)
-----   Message from Ethel Rana, RN sent at 12/13/2019  2:54 PM EDT -----  Regarding: FW: Utility letter  Contact: [phone]  Hi front desk,  Please call and help with letter.   Thank you  p  ----- Message -----  From: Oneta Rack  Sent: 12/13/2019   2:37 PM EDT  To: Elvera Lennox Rn Pool  Subject: Utility letter                                   [name] [id number], 64 year old, female    Calls today:  Letters  Utility    Has patient received a utility letter from [place] before? Yes   Name of person requesting utility letter [name]  Name of utility company [employer]   [employer] manager   Account number [id number]  Bill holder name Mishka A. Stallings   Fax number Pt does not know the fax number   Home addressed confirmed Yes  +++Please Note: All information listed above is needed for each utility request, even if requested before+++    Person calling on behalf of patient: Patient (self)  Patient's language of care: English    Patient does not need an interpreter.    Patient's PCP: [name], MD

## 2019-12-14 ENCOUNTER — Ambulatory Visit (HOSPITAL_BASED_OUTPATIENT_CLINIC_OR_DEPARTMENT_OTHER): Payer: Self-pay | Admitting: Specialist

## 2019-12-20 ENCOUNTER — Other Ambulatory Visit (HOSPITAL_BASED_OUTPATIENT_CLINIC_OR_DEPARTMENT_OTHER): Payer: Self-pay | Admitting: Family Medicine

## 2019-12-20 ENCOUNTER — Ambulatory Visit (HOSPITAL_BASED_OUTPATIENT_CLINIC_OR_DEPARTMENT_OTHER): Payer: 59 | Admitting: Registered Nurse

## 2019-12-20 DIAGNOSIS — F1120 Opioid dependence, uncomplicated: Secondary | ICD-10-CM

## 2019-12-20 MED ORDER — BUPRENORPHINE HCL-NALOXONE HCL 8-2 MG SL FILM
ORAL_FILM | SUBLINGUAL | 0 refills | Status: DC
Start: 2019-12-20 — End: 2020-01-03

## 2019-12-30 ENCOUNTER — Other Ambulatory Visit (HOSPITAL_BASED_OUTPATIENT_CLINIC_OR_DEPARTMENT_OTHER): Payer: Self-pay | Admitting: Internal Medicine

## 2019-12-30 NOTE — Telephone Encounter (Signed)
PER Pharmacy, Cheryl Dixon is a 64 year old female has requested a refill of     -  NICOTINE PATCH       Last Office Visit: 12/06/2019 with Quenton Fetter  Last Physical Exam: 05/27/2013    COLONOSCOPY due on 08/22/2017    Other Med Adult:  Most Recent BP Reading(s)  06/30/19 : (!) 142/80        Cholesterol (mg/dl)   Date Value   01/04/2010 264 (H)     LOW DENSITY LIPOPROTEIN DIRECT (mg/dl)   Date Value   01/04/2010 101 (H)     HIGH DENSITY LIPOPROTEIN (mg/dl)   Date Value   01/04/2010 38     No results found for: TG      THYROID SCREEN TSH REFLEX FT4 (uIU/mL)   Date Value   01/12/2019 1.630         No results found for: TSH    HEMOGLOBIN A1C (%)   Date Value   01/12/2019 4.6       No results found for: POCA1C      INR (no units)   Date Value   02/16/2007 1.0 (L)   07/03/2006 < 1.0 (L)       SODIUM (mmol/L)   Date Value   01/12/2019 141       POTASSIUM (mmol/L)   Date Value   01/12/2019 4.7           CREATININE (mg/dL)   Date Value   01/12/2019 1.1       Documented patient preferred pharmacies:    [id number] [place] [place] [facility] [place]  Phone: [phone] Fax: [phone]

## 2020-01-03 ENCOUNTER — Ambulatory Visit: Payer: 59 | Admitting: Registered Nurse

## 2020-01-03 DIAGNOSIS — F1120 Opioid dependence, uncomplicated: Secondary | ICD-10-CM

## 2020-01-03 MED ORDER — BUPRENORPHINE HCL-NALOXONE HCL 8-2 MG SL FILM
ORAL_FILM | SUBLINGUAL | 0 refills | Status: DC
Start: 2020-01-03 — End: 2020-01-17

## 2020-01-12 ENCOUNTER — Other Ambulatory Visit (HOSPITAL_BASED_OUTPATIENT_CLINIC_OR_DEPARTMENT_OTHER): Payer: Self-pay | Admitting: Internal Medicine

## 2020-01-12 MED ORDER — BUPROPION HCL ER (SR) 200 MG PO TB12
200.0000 mg | ORAL_TABLET | Freq: Two times a day (BID) | ORAL | 3 refills | Status: DC
Start: 2020-01-12 — End: 2021-01-05

## 2020-01-12 NOTE — Addendum Note (Signed)
Addended by: Jaella Weinert on: 01/12/2020 09:56 AM     Modules accepted: Orders

## 2020-01-12 NOTE — Telephone Encounter (Signed)
PER Pharmacy, Cheryl Dixon is a 64 year old female has requested a refill of Bupropion.      Last Office Visit:12-06-2019 with [name]  Last Physical Exam: 05-27-2013    COLONOSCOPY due on 08/22/2017    Other Med Adult:  Most Recent BP Reading(s)  06/30/19 : (!) 142/80        Cholesterol (mg/dl)   Date Value   01/04/2010 264 (H)     LOW DENSITY LIPOPROTEIN DIRECT (mg/dl)   Date Value   01/04/2010 101 (H)     HIGH DENSITY LIPOPROTEIN (mg/dl)   Date Value   01/04/2010 38     No results found for: TG      THYROID SCREEN TSH REFLEX FT4 (uIU/mL)   Date Value   01/12/2019 1.630         No results found for: TSH    HEMOGLOBIN A1C (%)   Date Value   01/12/2019 4.6       No results found for: POCA1C      INR (no units)   Date Value   02/16/2007 1.0 (L)   07/03/2006 < 1.0 (L)       SODIUM (mmol/L)   Date Value   01/12/2019 141       POTASSIUM (mmol/L)   Date Value   01/12/2019 4.7           CREATININE (mg/dL)   Date Value   01/12/2019 1.1       Documented patient preferred pharmacies:    [id number] [place], [place] - [place]  Phone: [phone] Fax: [phone]

## 2020-01-17 ENCOUNTER — Ambulatory Visit (HOSPITAL_BASED_OUTPATIENT_CLINIC_OR_DEPARTMENT_OTHER): Payer: 59 | Admitting: Family Medicine

## 2020-01-17 ENCOUNTER — Other Ambulatory Visit (HOSPITAL_BASED_OUTPATIENT_CLINIC_OR_DEPARTMENT_OTHER): Payer: Self-pay | Admitting: Family Medicine

## 2020-01-17 DIAGNOSIS — F1120 Opioid dependence, uncomplicated: Secondary | ICD-10-CM

## 2020-01-17 MED ORDER — BUPRENORPHINE HCL-NALOXONE HCL 8-2 MG SL FILM
ORAL_FILM | SUBLINGUAL | 0 refills | Status: DC
Start: 2020-01-17 — End: 2020-01-31

## 2020-01-19 ENCOUNTER — Ambulatory Visit (HOSPITAL_BASED_OUTPATIENT_CLINIC_OR_DEPARTMENT_OTHER): Payer: 59 | Admitting: Psychologist

## 2020-01-19 ENCOUNTER — Telehealth (HOSPITAL_BASED_OUTPATIENT_CLINIC_OR_DEPARTMENT_OTHER): Payer: Self-pay | Admitting: Psychologist

## 2020-01-19 ENCOUNTER — Ambulatory Visit (HOSPITAL_BASED_OUTPATIENT_CLINIC_OR_DEPARTMENT_OTHER): Payer: Self-pay | Admitting: Ambulatory Care

## 2020-01-19 NOTE — Telephone Encounter (Addendum)
Regarding: Trouble Breathing / Medication Clarification   ----- Message from Cheryl Dixon sent at 01/19/2020  1:35 PM EDT -----  Cheryl Dixon [id number], 64 year old, female    Calls today:  Sick    What are the symptoms Pt initially called because she wanted me to send a message to an RN regarding to her medications and that she has questions to ask about what to do. But pt is now requesting that she needs a sooner appt with PCP (gave pt next availabilities via tele for next week 9/29 but denied) because she really needs to talk to him about how she feels like she is not getting enough of oxygen, she wanted to mentioned that she has COPD - lung problems and the symptom she is having now is trouble breathing (pt denied 911). Pt said she is unsure of what might be the cause because [place] and believes it could be allergies. She also took a covid test last Friday (01/13/20) and the results came back negative. Pt also mentioned she feels like her inhaler doesn't help at all with her breathing. Pt also mentioned that she does not have any chest pain and that she can taste and smell.    How long has patient been sick? N/A  What has pt. tried at home [employer]   Person calling on behalf of patient: Patient (self)    [place] NUMBER: [phone]      Patient's language of care: English    Patient does not need an interpreter.    Patient's PCP: [name], MD      Returned call to pt   Pt states she is running out of estradiol cream too soon   Reviewed estradiol prescription   Pt using it BID  Advised to only use 4gm vaginally 4xweek   Pt verbalizes understanding   Will decrease usage     Pt also states she feels her COPD is acting up   + cough   Feels she may need to change her inhaler   Using inhaler but not working as well as it usually does   Pt speaking in complete sentences   +  congestion   Taking mucinex   Denies fever or any other covid red flag symptoms   Declines covid drive thru  appointment   States she was already tested ( neg)    Pt states she will rest in her home in the A/C  [facility] elevated   Maintain hydration   televisit scheduled   Pt advised to seek care urgently if cough/SOB worsens   Pt agrees     Jeanmarie Hubert, RN, 01/19/2020

## 2020-01-19 NOTE — Telephone Encounter (Signed)
10 min into appt start time, pt had  not joined [facility] video teletherapy appt for [facility] therapy intake. Writer re-sent video link, and called pt 2x by phone. Pt not available, and unable to leave voicemail.

## 2020-01-23 ENCOUNTER — Ambulatory Visit: Payer: 59 | Admitting: Physician Assistant

## 2020-01-23 ENCOUNTER — Encounter (HOSPITAL_BASED_OUTPATIENT_CLINIC_OR_DEPARTMENT_OTHER): Payer: Self-pay | Admitting: Physician Assistant

## 2020-01-23 NOTE — Progress Notes (Signed)
Not present for video visit, sent invite but did nt join. Called x 2, no answer, left message asking to call to reschedule

## 2020-01-23 NOTE — Progress Notes (Signed)
Patient left without being seen.

## 2020-01-31 ENCOUNTER — Ambulatory Visit: Payer: 59 | Attending: Family Medicine | Admitting: Family Medicine

## 2020-01-31 DIAGNOSIS — F1120 Opioid dependence, uncomplicated: Secondary | ICD-10-CM | POA: Insufficient documentation

## 2020-01-31 DIAGNOSIS — N952 Postmenopausal atrophic vaginitis: Secondary | ICD-10-CM | POA: Diagnosis present

## 2020-01-31 MED ORDER — ESTRADIOL 0.1 MG/GM VA CREA
TOPICAL_CREAM | VAGINAL | 5 refills | Status: DC
Start: 2020-01-31 — End: 2020-05-04

## 2020-01-31 MED ORDER — BUPRENORPHINE HCL-NALOXONE HCL 8-2 MG SL FILM
ORAL_FILM | SUBLINGUAL | 0 refills | Status: DC
Start: 2020-01-31 — End: 2020-02-14

## 2020-01-31 NOTE — Progress Notes (Signed)
Cheryl Dixon is a 64 year old female seen in follow up for opioid use disorder:    Buprenorphine dose:20mg   Response, adequacy of dose: good  Relapses/close calls: none  Trigger: denies  Meetings: none currently    Social history/events:  Was on vaginal estrogen for atrophic vaginitis, not on it and feels like having some vaginal symptoms again, in addition to fatigue and hot flashes  Started it back in March, was very effective  No history of breast cancer    Present Medications:  buprenorphine-naloxone (SUBOXONE) 8-2 MG sublingual film, Take two and one-half films under the tongue daily., Disp: 35 Film, Rfl: 0  buPROPion (WELLBUTRIN SR) 200 MG 12 hr tablet, Take 1 tablet by mouth 2 (two) times daily, Disp: 180 tablet, Rfl: 3  tiotropium (SPIRIVA HANDIHALER) 18 MCG inhalation capsule, Inhale 1 capsule into the lungs daily, Disp: 30 capsule, Rfl: 5  naproxen (NAPROSYN) 500 MG tablet, TAKE 1 TABLET BY MOUTH WITH FOOD EVERY TWELVE HOURS AS NEEDED FOR PAIN, Disp: 20 tablet, Rfl: 0  polyethylene glycol (HEALTHYLAX) 17 g packet, TAKE 17G BY MOUTH DAILY MIX IN 64 OUNCE OF CLEAR LIQUID AND TAKE as directed, Disp: 28 packet, Rfl: 2  umeclidinium (INCRUSE ELLIPTA) 62.5 MCG/INH inhaler, INHALE 1 PUFF BY MOUTH INTO LUNGS DAILY, Disp: 1 Inhaler, Rfl: 11  doxepin (SINEQUAN) 10 MG capsule, Take 1-2 capsules by mouth daily  for 14 days, Disp: 28 capsule, Rfl: 0  albuterol (VENTOLIN HFA) 108 (90 Base) MCG/ACT inhaler, INHALE 2 PUFFS INTO THE LUNGS EVERY 6 HOURS AS NEEDED FOR WHEEZING OR SHORTNESS OF BREATH, Disp: 8.5 g, Rfl: 11  ipratropium-albuterol (DUO-NEB) 0.5-2.5 (3) MG/3ML SOLN Inhalation Solution, Take 3 mLs by nebulization 4 (four) times daily., Disp: 180 mL, Rfl: 0    No current facility-administered medications on file prior to visit.      PHQ-9 TOTAL SCORE 01/12/2019 06/01/2018 03/03/2017   Doc FlowSheet Total Score - - -   Doc FlowSheet Total Score 10 6 4            PHYSICAL EXAMINATION via video:  General  appearance - healthy female in no distress  Neuro - nonfocal  Affect - normal  Behavior without evidence of intoxication or impairment    PMP reviewed. No unauthorized prescriptions since last refill.            Assessment:  Opioid Dependence  Comment: condition gradually improving on current dose, actively participating in group.  Plan:  Continue buprenorphine, reviewed criteria for tapering when patient is ready.    Atrophic vaginitis, postmenopausal - previously improved with vaginal estrogen, no history of breast cancer, low risk of AE.   [place] to refill    Patient is counseled regarding relapse prevention, involvement in recovery groups, and side effects of buprenorphine.    For the patient participating in the group via telephone and/or video technology: the patient/guardian has verbally consented to participate in this group therapy session by televisit. The patient/guardian was encouraged to be in a private location due to personal health information being discussed and where they could not be overheard by individuals not participating in the group therapy. Although all [facility] staff/providers participating in this group therapy televisit are in a private location, all the risks of telephone and/or video technology used to conduct this group therapy session cannot be controlled by [place], (i.e. interruptions, unauthorized access/recording and technical difficulties).  Each patient understands that the visit can be stopped at any time for any reason, including if the conferencing  connections are inadequate.   We discussed the patients current medications. The patient expressed understanding and no barriers to adherence were identified.  1. The patient indicates understanding of these issues and agrees with the plan.    2. I reviewed the patient's medical information and medical history   3. I reconciled the patient's medication list and prepared and supplied needed refills.   4. I have reviewed the past medical,  family, and social history sections including the medications and allergies.

## 2020-02-14 ENCOUNTER — Ambulatory Visit: Payer: 59 | Attending: Family Medicine | Admitting: Family Medicine

## 2020-02-14 DIAGNOSIS — F1120 Opioid dependence, uncomplicated: Secondary | ICD-10-CM | POA: Diagnosis present

## 2020-02-14 MED ORDER — BUPRENORPHINE HCL-NALOXONE HCL 8-2 MG SL FILM
ORAL_FILM | SUBLINGUAL | 0 refills | Status: DC
Start: 2020-02-14 — End: 2020-02-28

## 2020-02-14 NOTE — Progress Notes (Signed)
Cheryl Dixon is a 64 year old female seen in follow up for opioid dependence:    Buprenorphine dose: 20mg   Response, adequacy of dose: good  Relapses/close calls: none  Trigger: denies  Meetings: none currently    Social history/events:  Interested in quitting smoking, down to 1 cig/day.  Thinking about chantix.  Will talk to PCP.  Doing well overall.    Present Medications:  buprenorphine-naloxone (SUBOXONE) 8-2 MG sublingual film, Take two and one-half films under the tongue daily., Disp: 35 Film, Rfl: 0  estradiol (ESTRACE VAGINAL) 0.1 MG/GM vaginal cream, Place 4 g vaginally daily for 14 days, THEN 2 g daily for 14 days, THEN 2 g 3 (three) times a week for 14 days, THEN 1 g 3 (three) times a week., Disp: 42.5 g, Rfl: 5  buPROPion (WELLBUTRIN SR) 200 MG 12 hr tablet, Take 1 tablet by mouth 2 (two) times daily, Disp: 180 tablet, Rfl: 3  tiotropium (SPIRIVA HANDIHALER) 18 MCG inhalation capsule, Inhale 1 capsule into the lungs daily, Disp: 30 capsule, Rfl: 5  naproxen (NAPROSYN) 500 MG tablet, TAKE 1 TABLET BY MOUTH WITH FOOD EVERY TWELVE HOURS AS NEEDED FOR PAIN, Disp: 20 tablet, Rfl: 0  polyethylene glycol (HEALTHYLAX) 17 g packet, TAKE 17G BY MOUTH DAILY MIX IN 64 OUNCE OF CLEAR LIQUID AND TAKE as directed, Disp: 28 packet, Rfl: 2  umeclidinium (INCRUSE ELLIPTA) 62.5 MCG/INH inhaler, INHALE 1 PUFF BY MOUTH INTO LUNGS DAILY, Disp: 1 Inhaler, Rfl: 11  doxepin (SINEQUAN) 10 MG capsule, Take 1-2 capsules by mouth daily  for 14 days, Disp: 28 capsule, Rfl: 0  albuterol (VENTOLIN HFA) 108 (90 Base) MCG/ACT inhaler, INHALE 2 PUFFS INTO THE LUNGS EVERY 6 HOURS AS NEEDED FOR WHEEZING OR SHORTNESS OF BREATH, Disp: 8.5 g, Rfl: 11  ipratropium-albuterol (DUO-NEB) 0.5-2.5 (3) MG/3ML SOLN Inhalation Solution, Take 3 mLs by nebulization 4 (four) times daily., Disp: 180 mL, Rfl: 0    No current facility-administered medications on file prior to visit.      PHQ-9 TOTAL SCORE 01/12/2019 06/01/2018 03/03/2017   Doc FlowSheet Total  Score - - -   Doc FlowSheet Total Score 10 6 4            PHYSICAL EXAMINATION via video:  General appearance - healthy female in no distress  Neuro - nonfocal  Affect - normal  Behavior without evidence of intoxication or impairment    PMP Reviewed.  No unauthorized prescriptions since last refill.     Assessment:  Opioid Dependence  Comment: Supported her around desire to quit smoking.  She will talk to PCP.  Plan:  Continue buprenorphine, reviewed criteria for tapering when patient is ready. Discussed sources of support.  Patient is counseled regarding relapse prevention, involvement in recovery groups, potential side effects of buprenorphine, and eventual taper of medication.    For the patient participating in the group via telephone and/or video technology: the patient/guardian has verbally consented to participate in this group therapy session by televisit. The patient/guardian was encouraged to be in a private location due to personal health information being discussed and where they could not be overheard by individuals not participating in the group therapy. Although all [facility] staff/providers participating in this group therapy televisit are in a private location, all the risks of telephone and/or video technology used to conduct this group therapy session cannot be controlled by , (i.e. interruptions, unauthorized access/recording and technical difficulties).  Each patient understands that the visit can be stopped at any time for  any reason, including if the conferencing connections are inadequate.       Geni Bers, MD, 02/14/2020

## 2020-02-23 ENCOUNTER — Other Ambulatory Visit (HOSPITAL_BASED_OUTPATIENT_CLINIC_OR_DEPARTMENT_OTHER): Payer: Self-pay | Admitting: Internal Medicine

## 2020-02-23 DIAGNOSIS — F1120 Opioid dependence, uncomplicated: Secondary | ICD-10-CM

## 2020-02-23 MED ORDER — INCRUSE ELLIPTA 62.5 MCG/INH IN AEPB
INHALATION_SPRAY | RESPIRATORY_TRACT | 11 refills | Status: DC
Start: 2020-02-23 — End: 2021-01-23

## 2020-02-23 NOTE — Telephone Encounter (Signed)
Patient has called requesting to get a refill on Incruse Ellipta which work better    Stating that Spiriva is not helping her     Patient is also requesting a call back from a nurse    Please advise  Thank you         [id number] [place], [place]  Phone: [phone] Fax: [phone]

## 2020-02-24 ENCOUNTER — Telehealth (HOSPITAL_BASED_OUTPATIENT_CLINIC_OR_DEPARTMENT_OTHER): Payer: Self-pay | Admitting: Internal Medicine

## 2020-02-24 NOTE — Telephone Encounter (Signed)
Prior authorization request for [place]    Patient has [employer] 340B for prescription coverage.  I.D.# [id number]    BCW#:[id number]  PCN#:[id number]  XIHWT:[id number]    Phone number: [phone]

## 2020-02-24 NOTE — Telephone Encounter (Signed)
-----   Message from [facility] sent at 02/24/2020  9:43 AM EDT -----  Regarding: alternative requested  F[place] calling on behalf of patient: Pharmacy    May list multiple medications in this section    Medicine Name: umeclidinium (INCRUSE ELLIPTA) 62.5 MCG/INH inhaler      Alternative requested : suggested alteratives - spiriva 18 mcg P handihaler, Spiriva respimat 2.5 mcg inh    Documented patient preferred pharmacies:   [id number] [address], [place]  Phone: [phone] Fax: [phone]          Patient's language of care: [place]

## 2020-02-28 ENCOUNTER — Ambulatory Visit: Payer: 59 | Admitting: Family Medicine

## 2020-02-28 DIAGNOSIS — F1120 Opioid dependence, uncomplicated: Secondary | ICD-10-CM

## 2020-02-28 DIAGNOSIS — Z5321 Procedure and treatment not carried out due to patient leaving prior to being seen by health care provider: Secondary | ICD-10-CM

## 2020-02-28 MED ORDER — BUPRENORPHINE HCL-NALOXONE HCL 8-2 MG SL FILM
ORAL_FILM | SUBLINGUAL | 0 refills | Status: DC
Start: 2020-02-28 — End: 2020-03-13

## 2020-02-28 NOTE — Progress Notes (Signed)
Unable to attend group today due to conflict.  She discussed with [name], RN.  Will send in fill.                   Patient left without being seen.

## 2020-02-28 NOTE — Telephone Encounter (Signed)
Patient has [place] for prescription coverage.  I.D.#: [id number]    Filled out  Prior Authorization Request form.    Form does not require provider signature - already faxed form to insurance company for review.    DX & ICD 10 COPD with exacerbation, Chronic obstructive pulmonary disease J44.1, J44.9  Previous Medication Tried: Spiriva   Labs/Additional information: office notes faxed     Scanned completed form and fax confirmation into Epic under Media Manager (PA for INCRUSE ELLIPTA) 62.5 MCG/INH inhaler).Marland Kitchen

## 2020-03-01 ENCOUNTER — Other Ambulatory Visit: Payer: Self-pay

## 2020-03-01 ENCOUNTER — Emergency Department (HOSPITAL_BASED_OUTPATIENT_CLINIC_OR_DEPARTMENT_OTHER): Payer: 59

## 2020-03-01 ENCOUNTER — Emergency Department
Admission: EM | Admit: 2020-03-01 | Discharge: 2020-03-01 | Disposition: A | Discharge status: Home / Self Care | Payer: 59 | Attending: Emergency Medicine | Admitting: Emergency Medicine

## 2020-03-01 DIAGNOSIS — Z20822 Contact with and (suspected) exposure to covid-19: Secondary | ICD-10-CM | POA: Diagnosis not present

## 2020-03-01 DIAGNOSIS — I712 Thoracic aortic aneurysm, without rupture: Secondary | ICD-10-CM

## 2020-03-01 DIAGNOSIS — R7989 Other specified abnormal findings of blood chemistry: Secondary | ICD-10-CM | POA: Diagnosis not present

## 2020-03-01 DIAGNOSIS — R0602 Shortness of breath: Secondary | ICD-10-CM

## 2020-03-01 LAB — BASIC METABOLIC PANEL
ANION GAP: 8 mmol/L (ref 5–15)
BUN (UREA NITROGEN): 19 mg/dL — ABNORMAL HIGH (ref 7–18)
CALCIUM: 8.3 mg/dL — ABNORMAL LOW (ref 8.5–10.1)
CARBON DIOXIDE: 32 mmol/L (ref 21–32)
CHLORIDE: 104 mmol/L (ref 98–107)
CREATININE: 1.1 mg/dL (ref 0.4–1.2)
ESTIMATED GLOMERULAR FILT RATE: 54 mL/min — ABNORMAL LOW (ref >60)
Glucose Random: 95 mg/dL (ref 74–160)
POTASSIUM: 4.3 mmol/L (ref 3.5–5.1)
SODIUM: 144 mmol/L (ref 136–145)

## 2020-03-01 LAB — CBC, PLATELET & DIFFERENTIAL
ABSOLUTE BASO COUNT: 0.1 10*3/uL (ref 0.0–0.1)
ABSOLUTE EOSINOPHIL COUNT: 0.4 10*3/uL (ref 0.0–0.8)
ABSOLUTE IMM GRAN COUNT: 0.06 10*3/uL — ABNORMAL HIGH (ref 0.00–0.03)
ABSOLUTE LYMPH COUNT: 1.9 10*3/uL (ref 0.6–5.9)
ABSOLUTE MONO COUNT: 0.5 10*3/uL (ref 0.2–1.4)
ABSOLUTE NEUTROPHIL COUNT: 2.8 10*3/uL (ref 1.6–8.3)
ABSOLUTE NRBC COUNT: 0 10*3/uL (ref 0.0–0.0)
BASOPHIL %: 1.2 % (ref 0.0–1.2)
EOSINOPHIL %: 6.3 % (ref 0.0–7.0)
HEMATOCRIT: 43.6 % (ref 34.1–44.9)
HEMOGLOBIN: 15 g/dL (ref 11.2–15.7)
IMMATURE GRANULOCYTE %: 1 % — ABNORMAL HIGH (ref 0.0–0.4)
LYMPHOCYTE %: 33.9 % (ref 15.0–54.0)
MEAN CORP HGB CONC: 34.4 g/dL (ref 31.0–37.0)
MEAN CORPUSCULAR HGB: 30.4 pg (ref 26.0–34.0)
MEAN CORPUSCULAR VOL: 88.4 fl (ref 80.0–100.0)
MEAN PLATELET VOLUME: 10.2 fL (ref 8.7–12.5)
MONOCYTE %: 8.2 % (ref 4.0–13.0)
NEUTROPHIL %: 49.4 % (ref 40.0–75.0)
NRBC %: 0 % (ref 0.0–0.0)
PLATELET COUNT: 288 10*3/uL (ref 150–400)
RBC DISTRIBUTION WIDTH STD DEV: 43.2 fL (ref 35.1–46.3)
RED BLOOD CELL COUNT: 4.93 M/uL (ref 3.90–5.20)
WHITE BLOOD CELL COUNT: 5.7 10*3/uL (ref 4.0–11.0)

## 2020-03-01 LAB — MAGNESIUM: MAGNESIUM: 2.1 mg/dL (ref 1.8–2.4)

## 2020-03-01 LAB — COVID-19 OUTPATIENT: COVID-19 OUTPATIENT: NEGATIVE

## 2020-03-01 LAB — D-DIMER PE/DVT, QUANTITATIVE: D-DIMER PE/DVT, QUANTITATIVE: 694 ng/mLFEU (ref 0.00–499)

## 2020-03-01 LAB — TROPONIN I: TROPONIN I: <0.02 ng/mL (ref 0.00–0.04)

## 2020-03-01 MED ORDER — NORMAL SALINE FLUSH 0.9 % IV SOLN
100.00 mL | Freq: Once | INTRAVENOUS | Status: AC
Start: 2020-03-01 — End: 2020-03-01
  Administered 2020-03-01: 100 mL via INTRAVENOUS

## 2020-03-01 MED ORDER — IOHEXOL 350 MG/ML IV SOLN
65.00 mL | Freq: Once | INTRAVENOUS | Status: AC
Start: 2020-03-01 — End: 2020-03-01
  Administered 2020-03-01: 65 mL via INTRAVENOUS

## 2020-03-01 NOTE — ED Triage Note (Signed)
Pt presents with 2-weeks of worsening shortness of breath, and back pain "like I was hit with a shovel. Some positional light-headedness. Reports fatigue.    Home treatment with mucinex but no improvement.

## 2020-03-01 NOTE — Discharge Instructions (Addendum)
You were seen and evaluated today for shortness of breath. Our evaluation, including a chest x ray, CT of your chest, and labs and an EKG do not indicate anything that would require emergent intervention at this time.   Please follow up with your primary care physician within two days.  We have also scheduled you an appointment with our vascular surgeon, Dr. Walden Field, on March 19, 2020 at 10:15 AM regarding the enlarged aorta that was seen on your CT scan.    Return to the Emergency Department if you experience worsening shortness of breath, chest pain, headache, light headedness, or any other concerning symptoms.  Thank you for choosing [place] for your care.

## 2020-03-01 NOTE — Narrator Note (Signed)
PT To ct scan

## 2020-03-01 NOTE — Narrator Note (Signed)
Patient Disposition  Patient education for diagnosis, medications, activity, diet and follow-up.  Patient left ED 5:11 PM.  Patient rep received written instructions.    Interpreter to provide instructions: No    Patient belongings with patient: YES    Have all existing LDAs been addressed? Yes    Have all IV infusions been stopped? N/A    Destination: Discharged to home
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
## 2020-03-01 NOTE — Telephone Encounter (Signed)
PRIOR AUTHORIZATION FOR incruse ellipta  has been approved      Start Date: 02/28/20  End Date: 04/27/2038     PA #:   Copay:     The patient and their preferred pharmacy are aware of the approval.  Approval notice has been scanned into media manager.

## 2020-03-02 ENCOUNTER — Telehealth (HOSPITAL_BASED_OUTPATIENT_CLINIC_OR_DEPARTMENT_OTHER): Payer: Self-pay

## 2020-03-02 NOTE — Telephone Encounter (Signed)
I called with negative covid test. Letter sent .  Cheryl Dixon, [place], 03/02/2020

## 2020-03-05 ENCOUNTER — Telehealth (HOSPITAL_BASED_OUTPATIENT_CLINIC_OR_DEPARTMENT_OTHER): Payer: Self-pay | Admitting: Registered Nurse

## 2020-03-05 NOTE — Telephone Encounter (Addendum)
-----   Message from Vito Backers sent at 03/02/2020  4:15 PM EDT -----  Regarding: Returing call ,asking to speak with RN Erven Colla [phone], 64 year old, female    Calls today:  Clinical Questions ([place])    Name of person calling Patient  Specific nature of request Returning call from RN, has some questions for RN Nevin Bloodgood  Return phone number [phone]  Person calling on behalf of patient: Patient (self)        Patient's language of care: English    Patient does not need an interpreter.    Patient's PCP: [name], MD        Left vm for pt to call rcc back.

## 2020-03-06 ENCOUNTER — Other Ambulatory Visit: Payer: Self-pay

## 2020-03-06 NOTE — Telephone Encounter (Signed)
Left vm for pt to call rcc back.

## 2020-03-08 ENCOUNTER — Other Ambulatory Visit: Payer: Self-pay

## 2020-03-08 ENCOUNTER — Ambulatory Visit: Payer: 59 | Attending: Internal Medicine | Admitting: Physician Assistant

## 2020-03-08 ENCOUNTER — Other Ambulatory Visit (HOSPITAL_BASED_OUTPATIENT_CLINIC_OR_DEPARTMENT_OTHER): Payer: Self-pay | Admitting: Physician Assistant

## 2020-03-08 VITALS — BP 140/97 | HR 78 | Temp 95.4°F | Ht 65.0 in | Wt 202.0 lb

## 2020-03-08 DIAGNOSIS — J449 Chronic obstructive pulmonary disease, unspecified: Secondary | ICD-10-CM | POA: Diagnosis present

## 2020-03-08 DIAGNOSIS — R030 Elevated blood-pressure reading, without diagnosis of hypertension: Secondary | ICD-10-CM | POA: Diagnosis present

## 2020-03-08 DIAGNOSIS — F17200 Nicotine dependence, unspecified, uncomplicated: Secondary | ICD-10-CM | POA: Diagnosis present

## 2020-03-08 DIAGNOSIS — I712 Thoracic aortic aneurysm, without rupture, unspecified: Secondary | ICD-10-CM

## 2020-03-08 DIAGNOSIS — IMO0001 Reserved for inherently not codable concepts without codable children: Secondary | ICD-10-CM

## 2020-03-08 DIAGNOSIS — R0789 Other chest pain: Secondary | ICD-10-CM | POA: Diagnosis present

## 2020-03-08 DIAGNOSIS — D126 Benign neoplasm of colon, unspecified: Secondary | ICD-10-CM | POA: Diagnosis not present

## 2020-03-08 NOTE — Patient Instructions (Signed)
Audiology [phone]  Physiatry [phone]   Breast Center [phone]  Plastic Surgery [phone]   Cardiology [phone]  Podiatry [phone]   Dermatology [phone] option 3  Pulmonology [phone]   Endocrinology [phone]  Rheumatology [phone]   ENT/Allergy [phone]  Sports Medicine [phone]   Gastroenterology [phone]  TB Clinic [phone]   Infectious Diseases [phone]  Thoracic Surgery [phone]   Nephrology [phone]  Travel Clinic (18+) [phone]   Neurology/EMG [phone]  Urology [phone]   Occupational Health [phone]  Vascular Surgery [phone]   Orthopedics [phone]  [place] (HIV) [phone]          [facility] Center (Ophthalmology/Optometry)  Women's Health (Gyn/OB/Midwives)   [place] [phone]  [place] [phone]   [place] [phone]  [place] [phone]      [place] [phone]          Radiology [phone]  Cards/Pulm Testing [phone]           PT/OT/Speech     [place]  [phone]     [place]       [place]

## 2020-03-08 NOTE — Progress Notes (Signed)
Cheryl Dixon is a 64 year old female pt of Dr. [name], MD who presents for f/u SOB         SOB  Seen in ED 11/4  Complained of DOE, pleuritic chest pain, cough  Worse over past year   No acute findings on CXR  CTA chest neg for PE   Incidentally noted to have 4.4cm thoracic aortic aneurysm, no evidence of dissection   Referred to vascular for f/u    Neg COVID test  Normal CBC  Neg trop   Felt likely SOB due to poorly controlled COPD  Recently prescribed incruse ellipta - switched from spiriva as she reported this was not helpful    Has not started incruse yet   Was better with incruse when using in the past   Smoking 1 cig daily  Dry cough   Denies fever        Pain   Upper R chest   Since maybe June   Has not had in 2 weeks or longer   Was happening every few days  Would last a few minutes then resolve  6/10, no TTP   No radiation   No exacerbating factors   Exercises, no pain with this   No triggered by activity   Had some traumatic things going on in life - some family stuff   Feels a little better about it now         Most Recent Weight Reading(s)  03/08/20 : 91.6 kg (202 lb)  03/01/20 : 81.6 kg (180 lb)  06/30/19 : 83.9 kg (185 lb)  01/27/19 : 88 kg (194 lb)  01/12/19 : 87.5 kg (193 lb)          Adenoma  Noted on colonoscopy 2018  Needs repeat colonoscopy, last was poor prep         Most Recent BP Reading(s)  03/08/20 : (!) 140/97  03/01/20 : 139/91  06/30/19 : (!) 142/80  01/27/19 : 130/82  01/12/19 : 126/85            BP (!) 140/97    Pulse 78    Temp 95.4 F (35.2 C) (Temporal)    Ht 5\' 5"  (1.651 m)    Wt 91.6 kg (202 lb)    LMP 11/20/1991    SpO2 97%    BMI 33.61 kg/m   Pain Score: Data Unavailable              Physical Exam  Vitals reviewed.   Constitutional:       General: She is not in acute distress.     Appearance: Normal appearance.   HENT:      Head: Normocephalic and atraumatic.   Cardiovascular:      Rate and Rhythm: Normal rate and regular rhythm.      Heart sounds: Normal heart sounds. No  murmur heard.     Pulmonary:      Effort: Pulmonary effort is normal. No respiratory distress.      Breath sounds: No wheezing, rhonchi or rales.   Musculoskeletal:      Right lower leg: No edema.      Left lower leg: No edema.   Neurological:      Mental Status: She is alert.             ASSESSMENT/PLAN        (J44.9) Chronic obstructive pulmonary disease, unspecified COPD type ([place])  (primary encounter diagnosis)  (F17.200) Smoking  Comment: Suspect  SOB due to poorly controlled COPD as she has not yet started incruse ellipta.   Plan: Start incruse ellipta as prescribed            Encouraged complete smoking cessation             F/u about 2 weeks to reassess            Pt educated on warning signs requiring urgent evaluation       (R07.89) Other chest pain  Comment: Pt reports recent intermittent R chest pain which has been resolved for last 2 weeks. Suspect this may have been MSK pain. Sxs atypical for angina, and now resolved so low concern for ongoing ischemia. Recently had CTA chest with no evidence of PE  Plan: monitor, return to clinic if sxs return            Pt educated on warning signs requiring urgent evaluation       (D12.6) Adenomatous polyp of colon, unspecified part of colon  Comment: Discussed need for repeat colonoscopy given last with poor prep, she will consider  Plan: Encourage colonoscopy at next visit       (R03.0) Elevated BP without diagnosis of hypertension  Comment: Mildly elevated today   Plan: recheck next visit       (I71.2) Thoracic aortic aneurysm without rupture ([place])  Comment: Noted incidentally on CT chest  Plan: Has appt with vascular for further evaluation         We discussed the patients current medications. The patient expressed understanding and no barriers to adherence were identified.   1. The patient indicates understanding of these issues and agrees with the plan. Brief care plan is updated and reviewed with the patient.   2. The patient is given an After Visit Summary  sheet that lists all medications with directions, allergies, orders placed during this encounter, and follow-up instructions.   3. I reviewed the patient's medical information and medical history   4. I reconciled the patient's medication list and prepared and supplied needed refills.   5. I have reviewed the past medical, family, and social history sections including the medications and allergies.

## 2020-03-12 ENCOUNTER — Ambulatory Visit (HOSPITAL_BASED_OUTPATIENT_CLINIC_OR_DEPARTMENT_OTHER): Payer: Self-pay

## 2020-03-13 ENCOUNTER — Ambulatory Visit (HOSPITAL_BASED_OUTPATIENT_CLINIC_OR_DEPARTMENT_OTHER): Payer: 59 | Admitting: Family Medicine

## 2020-03-13 ENCOUNTER — Ambulatory Visit: Payer: 59 | Attending: Family Medicine | Admitting: Family Medicine

## 2020-03-13 DIAGNOSIS — F1120 Opioid dependence, uncomplicated: Secondary | ICD-10-CM | POA: Diagnosis present

## 2020-03-13 MED ORDER — BUPRENORPHINE HCL-NALOXONE HCL 8-2 MG SL FILM
ORAL_FILM | SUBLINGUAL | 0 refills | Status: DC
Start: 2020-03-13 — End: 2020-03-27

## 2020-03-13 NOTE — Progress Notes (Signed)
Cheryl Dixon is a 64 year old female seen in follow up for opioid dependence:    Buprenorphine dose: 20mg   Response, adequacy of dose: good  Relapses/close calls: none  Trigger: denies  Meetings: none currently    Social history/events:  Doing well.  Offered to make thanksgiving dinner for any group members who need it.    Present Medications:  [DISCONTINUED] buprenorphine-naloxone (SUBOXONE) 8-2 MG sublingual film, Take two and one-half films under the tongue daily., Disp: 35 Film, Rfl: 0  umeclidinium (INCRUSE ELLIPTA) 62.5 MCG/INH inhaler, INHALE 1 PUFF BY MOUTH INTO LUNGS DAILY, Disp: 1 each, Rfl: 11  estradiol (ESTRACE VAGINAL) 0.1 MG/GM vaginal cream, Place 4 g vaginally daily for 14 days, THEN 2 g daily for 14 days, THEN 2 g 3 (three) times a week for 14 days, THEN 1 g 3 (three) times a week., Disp: 42.5 g, Rfl: 5  buPROPion (WELLBUTRIN SR) 200 MG 12 hr tablet, Take 1 tablet by mouth 2 (two) times daily, Disp: 180 tablet, Rfl: 3  naproxen (NAPROSYN) 500 MG tablet, TAKE 1 TABLET BY MOUTH WITH FOOD EVERY TWELVE HOURS AS NEEDED FOR PAIN, Disp: 20 tablet, Rfl: 0  doxepin (SINEQUAN) 10 MG capsule, Take 1-2 capsules by mouth daily  for 14 days, Disp: 28 capsule, Rfl: 0  albuterol (VENTOLIN HFA) 108 (90 Base) MCG/ACT inhaler, INHALE 2 PUFFS INTO THE LUNGS EVERY 6 HOURS AS NEEDED FOR WHEEZING OR SHORTNESS OF BREATH, Disp: 8.5 g, Rfl: 11  ipratropium-albuterol (DUO-NEB) 0.5-2.5 (3) MG/3ML SOLN Inhalation Solution, Take 3 mLs by nebulization 4 (four) times daily., Disp: 180 mL, Rfl: 0    No current facility-administered medications on file prior to visit.      PHQ-9 TOTAL SCORE 01/12/2019 06/01/2018 03/03/2017   Doc FlowSheet Total Score - - -   Doc FlowSheet Total Score 10 6 4            PHYSICAL EXAMINATION via video:  General appearance - healthy female in no distress  Neuro - nonfocal  Affect - normal  Behavior without evidence of intoxication or impairment    PMP Reviewed.  No unauthorized prescriptions since last  refill.     Assessment:  Opioid Dependence  Comment: Stable in recovery, doing well.  Generously offered Tgiving dinner for all, which resonated with many group members.  Plan:  Continue buprenorphine, reviewed criteria for tapering when patient is ready. Discussed sources of support.  Patient is counseled regarding relapse prevention, involvement in recovery groups, potential side effects of buprenorphine, and eventual taper of medication.    For the patient participating in the group via telephone and/or video technology: the patient/guardian has verbally consented to participate in this group therapy session by televisit. The patient/guardian was encouraged to be in a private location due to personal health information being discussed and where they could not be overheard by individuals not participating in the group therapy. Although all [facility] staff/providers participating in this group therapy televisit are in a private location, all the risks of telephone and/or video technology used to conduct this group therapy session cannot be controlled by [facility], (i.e. interruptions, unauthorized access/recording and technical difficulties).  Each patient understands that the visit can be stopped at any time for any reason, including if the conferencing connections are inadequate.       Geni Bers, MD, 03/13/2020

## 2020-03-18 ENCOUNTER — Encounter (HOSPITAL_BASED_OUTPATIENT_CLINIC_OR_DEPARTMENT_OTHER): Payer: Self-pay | Admitting: Physician Assistant

## 2020-03-18 DIAGNOSIS — I7121 Aneurysm of the ascending aorta, without rupture: Secondary | ICD-10-CM | POA: Insufficient documentation

## 2020-03-18 DIAGNOSIS — I712 Thoracic aortic aneurysm, without rupture, unspecified: Secondary | ICD-10-CM | POA: Insufficient documentation

## 2020-03-19 ENCOUNTER — Ambulatory Visit: Payer: 59 | Attending: Surgery | Admitting: Surgery

## 2020-03-19 ENCOUNTER — Encounter (HOSPITAL_BASED_OUTPATIENT_CLINIC_OR_DEPARTMENT_OTHER): Payer: Self-pay | Admitting: Surgery

## 2020-03-19 ENCOUNTER — Other Ambulatory Visit: Payer: Self-pay

## 2020-03-19 VITALS — BP 132/86 | HR 76 | Temp 98.0°F

## 2020-03-19 DIAGNOSIS — I712 Thoracic aortic aneurysm, without rupture, unspecified: Secondary | ICD-10-CM
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
## 2020-03-21 ENCOUNTER — Ambulatory Visit (HOSPITAL_BASED_OUTPATIENT_CLINIC_OR_DEPARTMENT_OTHER): Payer: Self-pay | Admitting: Physician Assistant

## 2020-03-21 NOTE — Progress Notes (Deleted)
Cheryl Dixon is a 64 year old female pt of Dr. [name], MD who presents for ***      COPD  Noted SOB at last visit, felt due to poorly controlled COPD  Had not yet started incruse ellipta which was recently prescribed for her        Thoracic aortic aneurysm  Saw vascular surgery, advised needs to f/u with thoracic surgery         Colonoscopy

## 2020-03-27 ENCOUNTER — Ambulatory Visit: Payer: 59 | Attending: Family Medicine | Admitting: Family Medicine

## 2020-03-27 DIAGNOSIS — F1120 Opioid dependence, uncomplicated: Secondary | ICD-10-CM

## 2020-03-27 MED ORDER — BUPRENORPHINE HCL-NALOXONE HCL 8-2 MG SL FILM
ORAL_FILM | SUBLINGUAL | 0 refills | Status: DC
Start: 2020-03-27 — End: 2020-04-10

## 2020-03-27 NOTE — Progress Notes (Signed)
Cheryl Dixon is a 64 year old female seen in follow up for opioid dependence:    Buprenorphine dose: 20mg   Response, adequacy of dose: good  Relapses/close calls: none  Trigger: denies  Meetings: none currently    Social history/events:  Had a good Thanksgiving.    Biggest issue has been COPD and new vascular concerns.  Noticed that sometimes deep breathing exercises are hard.      Present Medications:  ibuprofen (ADVIL) 600 MG tablet, Take 1 tablet by mouth 3 (three) times daily, Disp: , Rfl:   buprenorphine-naloxone (SUBOXONE) 8-2 MG sublingual film, Take two and one-half films under the tongue daily., Disp: 35 Film, Rfl: 0  umeclidinium (INCRUSE ELLIPTA) 62.5 MCG/INH inhaler, INHALE 1 PUFF BY MOUTH INTO LUNGS DAILY, Disp: 1 each, Rfl: 11  estradiol (ESTRACE VAGINAL) 0.1 MG/GM vaginal cream, Place 4 g vaginally daily for 14 days, THEN 2 g daily for 14 days, THEN 2 g 3 (three) times a week for 14 days, THEN 1 g 3 (three) times a week., Disp: 42.5 g, Rfl: 5  buPROPion (WELLBUTRIN SR) 200 MG 12 hr tablet, Take 1 tablet by mouth 2 (two) times daily, Disp: 180 tablet, Rfl: 3  naproxen (NAPROSYN) 500 MG tablet, TAKE 1 TABLET BY MOUTH WITH FOOD EVERY TWELVE HOURS AS NEEDED FOR PAIN, Disp: 20 tablet, Rfl: 0  doxepin (SINEQUAN) 10 MG capsule, Take 1-2 capsules by mouth daily  for 14 days, Disp: 28 capsule, Rfl: 0  albuterol (VENTOLIN HFA) 108 (90 Base) MCG/ACT inhaler, INHALE 2 PUFFS INTO THE LUNGS EVERY 6 HOURS AS NEEDED FOR WHEEZING OR SHORTNESS OF BREATH, Disp: 8.5 g, Rfl: 11  ipratropium-albuterol (DUO-NEB) 0.5-2.5 (3) MG/3ML SOLN Inhalation Solution, Take 3 mLs by nebulization 4 (four) times daily., Disp: 180 mL, Rfl: 0    No current facility-administered medications on file prior to visit.      PHQ-9 TOTAL SCORE 01/12/2019 06/01/2018 03/03/2017   Doc FlowSheet Total Score - - -   Doc FlowSheet Total Score 10 6 4            PHYSICAL EXAMINATION via video:  General appearance - healthy female in no distress  Neuro -  nonfocal  Affect - normal  Behavior without evidence of intoxication or impairment    PMP Reviewed.  No unauthorized prescriptions since last refill.     Assessment:  Opioid Dependence  Comment: offering great support to other group members and reports good stability in recovery.  Plans to outreach to PCP regarding ongoing medical concerns.  Plan:  Continue buprenorphine, reviewed criteria for tapering when patient is ready. Discussed sources of support.  Patient is counseled regarding relapse prevention, involvement in recovery groups, potential side effects of buprenorphine, and eventual taper of medication.    For the patient participating in the group via telephone and/or video technology: the patient/guardian has verbally consented to participate in this group therapy session by televisit. The patient/guardian was encouraged to be in a private location due to personal health information being discussed and where they could not be overheard by individuals not participating in the group therapy. Although all [facility] staff/providers participating in this group therapy televisit are in a private location, all the risks of telephone and/or video technology used to conduct this group therapy session cannot be controlled by [facility], (i.e. interruptions, unauthorized access/recording and technical difficulties).  Each patient understands that the visit can be stopped at any time for any reason, including if the conferencing connections are inadequate.  Geni Bers, MD, 03/27/2020

## 2020-03-29 ENCOUNTER — Telehealth (HOSPITAL_BASED_OUTPATIENT_CLINIC_OR_DEPARTMENT_OTHER): Payer: Self-pay | Admitting: Ambulatory Care

## 2020-03-29 DIAGNOSIS — I712 Thoracic aortic aneurysm, without rupture, unspecified: Secondary | ICD-10-CM

## 2020-03-29 NOTE — Telephone Encounter (Signed)
Pt should reschedule appt with me or Dr. Lisa Roca, was supposed to f/u on COPD at recent appt.     I placed a new referral for f/u on her aneurysm

## 2020-03-29 NOTE — Telephone Encounter (Signed)
Patient Upset about no one contacting her on her recent request to receive a return call  Received: Today  Cheryl Dixon  [facility]     Cheryl Dixon [id number], 64 year old, female, Telephone Information:   Home Phone   [phone]   Work Phone   Not on file.   Mobile     [phone]       Patient's Preferred Pharmacy:     [id number] - [place], [place]   Phone: [phone] Fax: [place], [place] [phone]   Phone: [phone] Fax: [phone]       CONFIRMED TODAY: [name] NUMBER: [phone]   Best time to call back:   Cell phone:   Other phone:     Available times:     Patient's language of care: English     Patient does not need an interpreter.     Patient's PCP: [name], MD     Person calling on behalf of patient: Patient (self)     Patient Upset about no one contacting her on her recent request     [name] left a message of the Referral line for me to return her call on this matter below.     [name] went to an appointment with Dr Randel Pigg - he let her know that this appointment for her is incorrect and that he would speak to Dr Nikki Dom.   [name] did have an appointment with [facility] the following day for the results of the appointment with Dr Randel Pigg. She did not attended it because Dr Randel Pigg told her she did not need to see him.     [name] explained to me that she did have a conversation with [name] about her well being besides the [facility].     [name] would like a call from Dr [employer]  today with questions and concerns and wants a return call       Reached out to return call to pt   Unable to reach   Message left to return call to the [name], RN, 03/29/2020

## 2020-03-30 NOTE — Telephone Encounter (Signed)
Reached out to pt to follow up with providers recommendations   Unable to reach   Message left on personal VM to return call to the [facility] and reschedule appointment and also informed ing pt that new referral has been placed     Jeanmarie Hubert, RN, 03/30/2020

## 2020-04-02 ENCOUNTER — Other Ambulatory Visit (HOSPITAL_BASED_OUTPATIENT_CLINIC_OR_DEPARTMENT_OTHER): Payer: Self-pay | Admitting: Internal Medicine

## 2020-04-02 DIAGNOSIS — I719 Aortic aneurysm of unspecified site, without rupture: Secondary | ICD-10-CM

## 2020-04-04 ENCOUNTER — Encounter (HOSPITAL_BASED_OUTPATIENT_CLINIC_OR_DEPARTMENT_OTHER): Payer: Self-pay | Admitting: Physician Assistant

## 2020-04-04 NOTE — Progress Notes (Signed)
I have had several conversations with Cheryl Dixon this past week scheduling this appointment for her .  Original referral was for Thoracic Surgery but after speaking to that department they forward me to Neurology         I schedule the appointment had a long conversation with Cheryl Dixon explaining the details of the appointment and where to park to make it easier for her.      She called my line a few times today when I was on the phone (I remember her number because it has been so frequent in the last few days) but she doesn't leave me a message.    I decided to call her back and in speaking with her she want to cancel or reschedule the appointment that I schedule for her tomorrow 04-05-2009 at [facility]. She said that she cant drive there?    She was just going to not call to cancel and I explained to her better to cancel or reschedule the appointment.      She mentioned as long as they do not call you so I transferred her to that department.           Referral       [facility] said that this is a Neurology referral - for the Diagnosis I created a new one   Appointment scheduled for:     Specialty Location: [facility] [place], [place]                   [address] at [place], [place] name: DR. Audry Riles MD   Specialist's NPI#: [id number]              Specialty Phone Number: [phone]   Specialty Fax Number: [phone]   Reason for appointment: Thoracic aortic aneurysm without rupture   Day of appt: Thursday                    Date of appt: 04-05-2020    Time of appt: 2:30 PM   Patient Notification: Confirmed appointment with Cheryl Dixon 04-02-2020 at 2:03

## 2020-04-05 ENCOUNTER — Encounter (HOSPITAL_BASED_OUTPATIENT_CLINIC_OR_DEPARTMENT_OTHER): Payer: Self-pay | Admitting: Internal Medicine

## 2020-04-05 ENCOUNTER — Encounter: Payer: Self-pay | Admitting: Orthopaedic Surgery

## 2020-04-05 ENCOUNTER — Other Ambulatory Visit: Payer: Self-pay

## 2020-04-05 ENCOUNTER — Ambulatory Visit: Payer: Self-pay

## 2020-04-05 ENCOUNTER — Ambulatory Visit (INDEPENDENT_AMBULATORY_CARE_PROVIDER_SITE_OTHER): Payer: 59 | Admitting: Orthopaedic Surgery

## 2020-04-05 DIAGNOSIS — M542 Cervicalgia: Secondary | ICD-10-CM

## 2020-04-05 DIAGNOSIS — M25511 Pain in right shoulder: Secondary | ICD-10-CM | POA: Diagnosis not present

## 2020-04-05 MED ORDER — LIDOCAINE HCL 1 % IJ SOLN
3.0000 mL | INTRAMUSCULAR | Status: AC | PRN
  Administered 2020-04-05: 3 mL

## 2020-04-05 MED ORDER — METHYLPREDNISOLONE ACETATE 40 MG/ML IJ SUSP
40.0000 mg | INTRAMUSCULAR | Status: AC | PRN
Start: 2020-04-05 — End: 2020-04-05
  Administered 2020-04-05: 40 mg via INTRA_ARTICULAR

## 2020-04-05 MED ORDER — BUPIVACAINE HCL 0.5 % IJ SOLN
3.0000 mL | INTRAMUSCULAR | Status: AC | PRN
  Administered 2020-04-05: 3 mL via INTRA_ARTICULAR

## 2020-04-05 NOTE — Progress Notes (Signed)
Office Visit Note   Patient: Michelle Roberts           Date of Birth: 12-03-1955           MRN: [id number] Visit Date: 04/05/2020              Requested by: Antony Contras, MD [address],  [place] [address] PCP: Antony Contras, MD   Assessment & Plan: Visit Diagnoses:  1. Acute pain of right shoulder   2. Neck pain     Plan: Impression is right shoulder pain probable bursitis and rotator cuff tendinosis.  Based on findings and our discussion patient agreed to undergo subacromial injection today.  She will follow up with [place] in a few weeks if she does not notice any improvement from this injection.  Follow-Up Instructions: No follow-ups on file.   Orders:  Orders Placed This Encounter  Procedures  . XR Shoulder Right  . XR Cervical Spine 2 or 3 views   No orders of the defined types were placed in this encounter.     Procedures: Large Joint Inj: R subacromial bursa on 04/05/2020 8:44 AM Indications: pain Details: 22 G needle  Arthrogram: No  Medications: 3 mL lidocaine 1 %; 3 mL bupivacaine 0.5 %; 40 mg methylPREDNISolone acetate 40 MG/ML Outcome: tolerated well, no immediate complications Consent was given by the patient. Patient was prepped and draped in the usual sterile fashion.       Clinical Data: No additional findings.   Subjective: Chief Complaint  Patient presents with  . Right Shoulder - Pain  . Neck - Pain    Michelle Roberts is a 64 year old female who is well-known to me comes in for evaluation of chronic right shoulder pain without any injuries.  She does have rheumatoid arthritis and fibromyalgia.  She has had the shoulder pain for at least a month.  She has tried a prednisone Dosepak without any relief.  She reports pain throughout the right shoulder with occasional radiation of pain down into the hand.  Denies any constant radiculopathy.   Review of Systems  Constitutional: Negative.   HENT: Negative.   Eyes: Negative.    Respiratory: Negative.   Cardiovascular: Negative.   Endocrine: Negative.   Musculoskeletal: Negative.   Neurological: Negative.   Hematological: Negative.   Psychiatric/Behavioral: Negative.   All other systems reviewed and are negative.    Objective: Vital Signs: There were no vitals taken for this visit.  Physical Exam Vitals and nursing note reviewed.  Constitutional:      Appearance: She is well-developed and well-nourished.  Pulmonary:     Effort: Pulmonary effort is normal.  Skin:    General: Skin is warm.     Capillary Refill: Capillary refill takes less than 2 seconds.  Neurological:     Mental Status: She is alert and oriented to person, place, and time.  Psychiatric:        Mood and Affect: Mood and affect normal.        Behavior: Behavior normal.        Thought Content: Thought content normal.        Judgment: Judgment normal.     Ortho Exam Cervical spine shows negative Spurling's.  No real tenderness to palpation.  Range of motion is normal.  Right shoulder shows diffuse tenderness.  Pain with empty can and Hawkins test.  Strength is grossly intact but slightly limited due to guarding.  Negative cross body adduction  Specialty  Comments:  No specialty comments available.  Imaging: XR Cervical Spine 2 or 3 views  Result Date: 04/05/2020 Straightening of cervical spine.  Degenerative disc disease most notable at C5-6 and C6-7.  XR Shoulder Right  Result Date: 04/05/2020 Moderate AC joint arthrosis.  No acute abnormalities.  Mild glenohumeral arthritis.    PMFS History: Patient Active Problem List   Diagnosis Date Noted  . Hallux rigidus, right foot 02/03/2018  . 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 Fibromyalgia 08/12/2016  . High risk medication use 08/12/2016  . Primary osteoarthritis of both hands 08/12/2016  . Acute midline low back pain 08/12/2016  . DDD (degenerative disc disease), lumbar 08/12/2016  . History of hypertension  08/12/2016  . History of high cholesterol 08/12/2016  . Thyroid ca (Michelle Roberts) 08/12/2016  . History of hypothyroidism 08/12/2016  . History of gastroesophageal reflux (GERD) 08/12/2016  . History of TMJ syndrome 08/12/2016  . Burning tongue syndrome 08/12/2016  . History of vitamin D deficiency 08/12/2016  . Other sleep apnea 08/12/2016  . Vitamin D deficiency 08/12/2016  . Rheumatoid arthritis with rheumatoid factor of multiple sites without organ or systems involvement ([place]) 12/31/2015  . Goals of care, counseling/discussion 06/02/2013  . Myofascial muscle pain 06/02/2013  . Narcotic-induced mood disorder ([place]) 06/02/2013  . Inflammatory arthritis 04/08/2011  . Plantar fasciitis 04/08/2011  . Yeast infection 04/08/2011   Past Medical History:  Diagnosis Date  . Anxiety   . Arthritis   . Depression   . Fibromyalgia   . GERD (gastroesophageal reflux disease)   . Hyperlipidemia   . Hypertension   . Hypothyroidism    thyroidectomy  . Neoplasm of parotid gland   . Neuromuscular disorder ([place])   . Rheumatoid aortitis   . Sleep apnea    uses CPAP sometimes    Family History  Problem Relation Age of Onset  . Alzheimer's disease Mother   . Alzheimer's disease Sister     Past Surgical History:  Procedure Laterality Date  . ABDOMINAL HYSTERECTOMY    . CHOLECYSTECTOMY    . HAMMER TOE SURGERY    . MASS EXCISION N/A 11/17/2017   Procedure: EXCISION SOFT PALATE LESION;  Surgeon: Rozetta Nunnery, MD;  Location: [facility];  Service: ENT;  Laterality: N/A;  . PAROTIDECTOMY Left 04/08/2016   Procedure: LEFT SUPERFICIAL PAROTIDECTOMY WITH FACIAL NERVE DISECTION;  Surgeon: Rozetta Nunnery, MD;  Location: [facility];  Service: ENT;  Laterality: Left;  . TARSAL METATARSAL ARTHRODESIS Right 02/03/2018   Procedure: RIGHT GREAT TOE  FUSION;  Surgeon: Leandrew Koyanagi, MD;  Location: [facility];  Service: Orthopedics;  Laterality: Right;  .  THYROIDECTOMY     Social History   Occupational History  . Not on file  Tobacco Use  . Smoking status: Never Smoker  . Smokeless tobacco: Never Used  Substance and Sexual Activity  . Alcohol use: No  . Drug use: No  . Sexual activity: Not on file

## 2020-04-05 NOTE — Progress Notes (Signed)
I have spoken to [facility] for [employer] four day  at least 3 to 4 times a day on this appointment instructions.    She called again at 1:35 ish today and I gave the appointment information again.    I was on a call trying to schedule for Dr Nancy Nordmann from [facility] and Timiko call and left me 4 message. It took mer a good 35 minutes register and schedule the appointment at [facility].     I called her back at 3:22     She told me that she  was in the ladies room and when she came downstairs she could find him or he could not find her. (her ride)      She was talking about 3 entrances and that why the driver/cab couldn't find her.      She did not go to the appointment at [facility]   When I spoke with her she was out to eat .  I explained that I will leave the doctors number on her phone and so that she can reschedule this appointment          Specialist's name: DR. Audry Riles MD   Specialist's NPI#: [id number]              Specialty Phone Number: [phone]   Specialty Fax Number: [phone]   Reason for appointment: Thoracic aortic aneurysm without rupture   Day of appt: Thursday                    Date of appt: 04-05-2020    Time of appt: 2:30 PM   Patient Notification: Confirmed appointment with Cheryl Dixon 04-02-2020 at 2:03

## 2020-04-10 ENCOUNTER — Ambulatory Visit: Payer: 59 | Attending: Family Medicine | Admitting: Family Medicine

## 2020-04-10 DIAGNOSIS — F1120 Opioid dependence, uncomplicated: Secondary | ICD-10-CM | POA: Diagnosis present

## 2020-04-10 MED ORDER — BUPRENORPHINE HCL-NALOXONE HCL 8-2 MG SL FILM
ORAL_FILM | SUBLINGUAL | 0 refills | Status: DC
Start: 2020-04-10 — End: 2020-05-10

## 2020-04-10 NOTE — Progress Notes (Signed)
Cheryl Dixon is a 64 year old female seen in follow up for opioid dependence:    Buprenorphine dose: 20mg   Response, adequacy of dose: good  Relapses/close calls: none  Trigger: denies  Meetings: none currently    Social history/events:  Joined visit but then logged off before able to share at the end.    Present Medications:  buprenorphine-naloxone (SUBOXONE) 8-2 MG sublingual film, Take two and one-half films under the tongue daily., Disp: 35 Film, Rfl: 0  ibuprofen (ADVIL) 600 MG tablet, Take 1 tablet by mouth 3 (three) times daily, Disp: , Rfl:   umeclidinium (INCRUSE ELLIPTA) 62.5 MCG/INH inhaler, INHALE 1 PUFF BY MOUTH INTO LUNGS DAILY, Disp: 1 each, Rfl: 11  estradiol (ESTRACE VAGINAL) 0.1 MG/GM vaginal cream, Place 4 g vaginally daily for 14 days, THEN 2 g daily for 14 days, THEN 2 g 3 (three) times a week for 14 days, THEN 1 g 3 (three) times a week., Disp: 42.5 g, Rfl: 5  buPROPion (WELLBUTRIN SR) 200 MG 12 hr tablet, Take 1 tablet by mouth 2 (two) times daily, Disp: 180 tablet, Rfl: 3  naproxen (NAPROSYN) 500 MG tablet, TAKE 1 TABLET BY MOUTH WITH FOOD EVERY TWELVE HOURS AS NEEDED FOR PAIN, Disp: 20 tablet, Rfl: 0  doxepin (SINEQUAN) 10 MG capsule, Take 1-2 capsules by mouth daily  for 14 days, Disp: 28 capsule, Rfl: 0  albuterol (VENTOLIN HFA) 108 (90 Base) MCG/ACT inhaler, INHALE 2 PUFFS INTO THE LUNGS EVERY 6 HOURS AS NEEDED FOR WHEEZING OR SHORTNESS OF BREATH, Disp: 8.5 g, Rfl: 11  ipratropium-albuterol (DUO-NEB) 0.5-2.5 (3) MG/3ML SOLN Inhalation Solution, Take 3 mLs by nebulization 4 (four) times daily., Disp: 180 mL, Rfl: 0    No current facility-administered medications on file prior to visit.      PHQ-9 TOTAL SCORE 01/12/2019 06/01/2018 03/03/2017   Doc FlowSheet Total Score - - -   Doc FlowSheet Total Score 10 6 4            PHYSICAL EXAMINATION via video:  General appearance - healthy female in no distress  Neuro - nonfocal  Affect - normal  Behavior without evidence of intoxication or  impairment    PMP Reviewed.  No unauthorized prescriptions since last refill.     Assessment:  Opioid Dependence  Comment: Cheryl Dixon to outreach to [employer].  She was present for much of the group, but unable to share at the end.  Plan:  Continue buprenorphine, reviewed criteria for tapering when patient is ready. Discussed sources of support.  Patient is counseled regarding relapse prevention, involvement in recovery groups, potential side effects of buprenorphine, and eventual taper of medication.    For the patient participating in the group via telephone and/or video technology: the patient/guardian has verbally consented to participate in this group therapy session by televisit. The patient/guardian was encouraged to be in a private location due to personal health information being discussed and where they could not be overheard by individuals not participating in the group therapy. Although all Cheryl Dixon staff/providers participating in this group therapy televisit are in a private location, all the risks of telephone and/or video technology used to conduct this group therapy session cannot be controlled by [place], (i.e. interruptions, unauthorized access/recording and technical difficulties).  Each patient understands that the visit can be stopped at any time for any reason, including if the conferencing connections are inadequate.       Geni Bers, MD, 04/10/2020

## 2020-04-11 ENCOUNTER — Telehealth (HOSPITAL_BASED_OUTPATIENT_CLINIC_OR_DEPARTMENT_OTHER): Payer: Self-pay | Admitting: Registered Nurse

## 2020-04-11 NOTE — Telephone Encounter (Signed)
Spoke with Cheryl Dixon who has sent multiple text messages to me regarding confusing her medical appointments.    I gave her the number of the specialist regarding her thoracic aortic aneurysm at [facility]  Dr Laverda Sorenson.    She will call her in the morning, and call to let me know when her appointment is.  I can provide a cab voucher if needed, and will help her follow through with the appointment.    I also advised her that I will speak with Dr Lisa Roca tomorrow for any  further instructions regarding appointments needed.    She also has a visit with ortho on Friday at [facility], which she is all set for transportation.    Will continue to follow as needed.

## 2020-04-12 ENCOUNTER — Telehealth (HOSPITAL_BASED_OUTPATIENT_CLINIC_OR_DEPARTMENT_OTHER): Payer: Self-pay | Admitting: Internal Medicine

## 2020-04-12 NOTE — Telephone Encounter (Signed)
Call re [facility] stuff

## 2020-04-13 ENCOUNTER — Other Ambulatory Visit: Payer: Self-pay

## 2020-04-13 ENCOUNTER — Ambulatory Visit (HOSPITAL_BASED_OUTPATIENT_CLINIC_OR_DEPARTMENT_OTHER): Admission: RE | Admit: 2020-04-13 | Payer: 59 | Source: Ambulatory Visit

## 2020-04-13 ENCOUNTER — Ambulatory Visit: Payer: 59 | Attending: Specialist | Admitting: Specialist

## 2020-04-13 DIAGNOSIS — M1711 Unilateral primary osteoarthritis, right knee: Secondary | ICD-10-CM

## 2020-04-13 DIAGNOSIS — M21161 Varus deformity, not elsewhere classified, right knee: Secondary | ICD-10-CM | POA: Diagnosis present

## 2020-04-13 DIAGNOSIS — M25561 Pain in right knee: Secondary | ICD-10-CM | POA: Insufficient documentation

## 2020-04-13 NOTE — Telephone Encounter (Signed)
We discussed recent events and referrals  She feels like she has everything sorted out now and is being well cared for.  Questions answered

## 2020-04-15 ENCOUNTER — Encounter (HOSPITAL_BASED_OUTPATIENT_CLINIC_OR_DEPARTMENT_OTHER): Payer: Self-pay | Admitting: Specialist

## 2020-04-15 MED ORDER — BUPIVACAINE HCL 0.5 % IJ SOLN
10.00 mL | Freq: Once | INTRAMUSCULAR | 0 refills | Status: AC
Start: 2020-04-15 — End: 2020-04-15

## 2020-04-15 MED ORDER — METHYLPREDNISOLONE ACETATE 80 MG/ML IJ SUSP
80.00 mg | Freq: Once | INTRAMUSCULAR | 0 refills | Status: AC
Start: 2020-04-15 — End: 2020-04-15

## 2020-04-15 NOTE — Progress Notes (Signed)
This patient is here regarding troubles with her right knee.  This woman is 64 years of age.  She recently has had some increased pain in her right knee.  She has had troubles over many years with pain in the right knee secondary to arthritis by her report.  She is seen several providers for this.  She has been given a steroid injection in the past with some transient improvement.    Of late she notes more pain with activity.  She recalls no specific injury.  Pain is now limiting activity and her ability to get around.    She has a fairly extensive and complex past medical history.  This was reviewed in the record.  Most notable is a history of COPD and recent diagnosis of thoracic aortic aneurysm.  She is undergoing work-up for this problem at [facility].    Past surgical history reviewed in the record.  She has multiple allergies listed in the record including Darvon, meperidine, Paxil, Zoloft.  She does smoke.       Physical Exam  Constitutional:       General: She is not in acute distress.     Appearance: She is obese. She is not ill-appearing.   Skin:     Capillary Refill: Capillary refill takes less than 2 seconds.   Neurological:      General: No focal deficit present.      Mental Status: She is alert.   Psychiatric:         Mood and Affect: Mood normal.       She has a slight varus deformity of the right knee.  Moderate boggy fullness is present of the right knee but there is no discrete effusion evident.  No warmth no erythema.  She can extend the knee fully and flex to about 115 degrees.  Patella tracks well but there is some crepitus under the patella with passive motion.  There is no varus no valgus instability.  No focal joint line tenderness present.  Lachman test negative.  Calf is soft and nontender.  Foot well-perfused.    Given an unexpected event radiology equipment was not available and thus x-rays not available for this visit.    Patient has acute right knee pain on top of chronic  right knee pain and presumed degenerative arthritis by history.  Unfortunately x-rays cannot be completed today as planned.  I spoke to the patient about a steroid injection to calm things down.  She wanted to move forward with this.  I will then plan on seeing her in about 3 weeks to gauge her progress and go forward with x-rays as indicated.    After confirming proper patient and site to be injected right knee was injected with 80 mg Depo-Medrol and 10 cc of Marcaine.  The patient understands the need for icing and activity modification today to make sure things calm down.  I will see her in 3 weeks unless there are problems sooner.

## 2020-04-24 ENCOUNTER — Ambulatory Visit (HOSPITAL_BASED_OUTPATIENT_CLINIC_OR_DEPARTMENT_OTHER): Payer: 59 | Admitting: Registered Nurse

## 2020-05-03 ENCOUNTER — Other Ambulatory Visit: Payer: Self-pay

## 2020-05-04 ENCOUNTER — Other Ambulatory Visit (HOSPITAL_BASED_OUTPATIENT_CLINIC_OR_DEPARTMENT_OTHER): Payer: Self-pay | Admitting: Internal Medicine

## 2020-05-04 DIAGNOSIS — N952 Postmenopausal atrophic vaginitis: Secondary | ICD-10-CM

## 2020-05-04 MED ORDER — ESTRADIOL 0.1 MG/GM VA CREA
4.0000 g | TOPICAL_CREAM | VAGINAL | 1 refills | Status: DC
Start: 2020-05-04 — End: 2020-07-17

## 2020-05-07 ENCOUNTER — Ambulatory Visit: Payer: 59 | Attending: Internal Medicine | Admitting: Physician Assistant

## 2020-05-07 ENCOUNTER — Encounter (HOSPITAL_BASED_OUTPATIENT_CLINIC_OR_DEPARTMENT_OTHER): Payer: Self-pay | Admitting: Physician Assistant

## 2020-05-07 DIAGNOSIS — Z20822 Contact with and (suspected) exposure to covid-19: Secondary | ICD-10-CM | POA: Insufficient documentation

## 2020-05-07 DIAGNOSIS — J3489 Other specified disorders of nose and nasal sinuses: Secondary | ICD-10-CM | POA: Diagnosis present

## 2020-05-07 DIAGNOSIS — R059 Cough, unspecified: Secondary | ICD-10-CM

## 2020-05-07 NOTE — Progress Notes (Signed)
Cheryl Dixon is a 65 year old female pt of Dr. [name], MD who presents for COVID        COVID-19   Son is COVID positive, she lives with him  Tested positive on rapid home test   Her sxs started 1/7  Had rhinorrhea  Tested negative on home test  Had fever 1/7, 102F  No fever since then   Also having cough   Some muscle aches in legs  Some sore throat   Quit smoking 05/01/19  Has had 2 doses of Moderna, >6 months ago   Denies chest pain, SOB, N/V/D, loss of taste/smell          Patient Active Problem List:     Opioid dependence on agonist therapy ([place])     Tobacco use disorder     Fibrocystic breast     Elevated BP     Knee pain     Chronic low back pain     OA (osteoarthritis) of knee     H. pylori infection     Major depressive disorder, recurrent episode, severe (HCC)     Occult blood in stools     Prediabetes     Epigastric pain     Chronic obstructive pulmonary disease (HCC)     Left foot pain     Adenomatous polyp of colon     Acquired deformity of toe, right     Dislocation of metatarsophalangeal joint of lesser toe, right, sequela     Postmenopausal atrophic vaginitis     Abnormal loss of weight     Thoracic aortic aneurysm without rupture (HCC)      estradiol (ESTRACE VAGINAL) 0.1 MG/GM vaginal cream, Place 4 g vaginally 3 (three) times a week, Disp: 127.5 g, Rfl: 1  cholecalciferol (HM VITAMIN D3) 1000 UNIT tablet, Take 1,000 Units by mouth daily, Disp: , Rfl:   acetaminophen (TYLENOL) 500 MG tablet, Take 500 mg by mouth every 6 (six) hours as needed for Pain, Disp: , Rfl:   buprenorphine-naloxone (SUBOXONE) 8-2 MG sublingual film, Take two and one-half films under the tongue daily., Disp: 70 Film, Rfl: 0  ibuprofen (ADVIL) 600 MG tablet, Take 1 tablet by mouth 3 (three) times daily, Disp: , Rfl:   umeclidinium (INCRUSE ELLIPTA) 62.5 MCG/INH inhaler, INHALE 1 PUFF BY MOUTH INTO LUNGS DAILY, Disp: 1 each, Rfl: 11  buPROPion (WELLBUTRIN SR) 200 MG 12 hr tablet, Take 1 tablet by mouth 2 (two) times daily,  Disp: 180 tablet, Rfl: 3  naproxen (NAPROSYN) 500 MG tablet, TAKE 1 TABLET BY MOUTH WITH FOOD EVERY TWELVE HOURS AS NEEDED FOR PAIN, Disp: 20 tablet, Rfl: 0  doxepin (SINEQUAN) 10 MG capsule, Take 1-2 capsules by mouth daily  for 14 days, Disp: 28 capsule, Rfl: 0  albuterol (VENTOLIN HFA) 108 (90 Base) MCG/ACT inhaler, INHALE 2 PUFFS INTO THE LUNGS EVERY 6 HOURS AS NEEDED FOR WHEEZING OR SHORTNESS OF BREATH, Disp: 8.5 g, Rfl: 11  ipratropium-albuterol (DUO-NEB) 0.5-2.5 (3) MG/3ML SOLN Inhalation Solution, Take 3 mLs by nebulization 4 (four) times daily., Disp: 180 mL, Rfl: 0    No current facility-administered medications on file prior to visit.        Review of Patient's Allergies indicates:   Darvon                     Meperidine hcl              Comment:Nausea/vomit   Paxil [paroxetine]  Rash   Zoloft [sertraline *    Rash          Exam   Pulm - speaking full sentences          ASSESSMENT/PLAN        (R05.9) Cough  (primary encounter diagnosis)  (J34.89) Rhinorrhea  (Z20.822) Suspected COVID-19 virus infection  Comment: Most likely COVID given sxs and close contact with son who tested positive. Pt tested negative but may have been too early to detect virus on home test. Has COPD but feeling ok today, denies respiratory distress and non detected during visit, do not think in person visit indicated. Had 2 doses of Moderna, so more likely to have mild illness.   Plan: Pt will pursue testing - offered [place] but she declined, too far for her           Reviewed some local testing options in [place], her daughter will help arrange           Home care advice as below           Pt to contact clinic if symptoms do not improve or get worse           Pt educated on warning signs requiring urgent evaluation                 COVID HOME CARE ADVICE:    The following instructions are intended for patients who have been have Covid, have a Covid test pending, or who may have Covid but cannot be tested.    Treating symptoms and  anticipatory guidance:     Continue to take your prescribed medicines.   Do not take other medicines unless they have been prescribed.   Rest and take plenty of fluids.   For symptomatic relief, use acetaminophen or ibuprofen, as long as you have not been told you cannot take these medicines.   Patients with worsening symptoms should:  ? Call their home clinic phone number.  ? Call 911 in an emergency.    Self-isolation and self-quarantine precautions:    Self-isolation directions for patients:  ? You should not leave home -- no work, school, public places, stores, public transportation. No exceptions except emergencies.  ? If your household members do not have Covid and you are able to, stay in a separate room from them and mask if in a shared space.  ? Duration of self-isolation:   ? Stay home for AT LEAST 5 days since first day of symptoms or date of positive test. If you have no symptoms or your symptoms are improved on or after Day 5, you can leave your house. You should mask around others for at least 10 days.  ? If you were hospitalized for Covid or are immunosuppressed, we recommend that you stay home for at least 10 days.  ? If you are still awaiting a test and your test comes back negative, you can end isolation if you feel better.   Self-quarantine directions for household members and close contacts:  ? Patients should notify all close contacts that they have symptoms of Covid.  ? All close contacts should be tested.  ? Close contacts should quarantine for 5 days and mask around others for another 5 days if they are unvaccinated or are eligible for a booster and have not received one. Close contacts who are vaccinated/boosted should wear a mask around others for 10 days but do not need to quarantine.  ? If the person  who was being tested tests negative, contacts can end quarantine.

## 2020-05-08 ENCOUNTER — Ambulatory Visit (HOSPITAL_BASED_OUTPATIENT_CLINIC_OR_DEPARTMENT_OTHER): Payer: 59 | Admitting: Family Medicine

## 2020-05-10 ENCOUNTER — Telehealth (HOSPITAL_BASED_OUTPATIENT_CLINIC_OR_DEPARTMENT_OTHER): Payer: Self-pay | Admitting: Internal Medicine

## 2020-05-10 DIAGNOSIS — F1120 Opioid dependence, uncomplicated: Secondary | ICD-10-CM

## 2020-05-10 DIAGNOSIS — U071 COVID-19: Secondary | ICD-10-CM

## 2020-05-10 MED ORDER — BUPRENORPHINE HCL-NALOXONE HCL 8-2 MG SL FILM
ORAL_FILM | SUBLINGUAL | 0 refills | Status: DC
Start: 2020-05-10 — End: 2020-05-22

## 2020-05-10 MED ORDER — NAPROXEN 500 MG PO TABS
ORAL_TABLET | ORAL | 0 refills | Status: DC
Start: 2020-05-10 — End: 2020-10-18

## 2020-05-10 NOTE — Telephone Encounter (Signed)
Tested positive for COVID      COVIDTRIAGE -- INITIAL TRIAGE:      A) COVID symptoms (criteria for testing):  Started last 1/7  The patient has the following NEW symptoms (past 14 days): Fever, Cough and Myalgias (body aches)  Currently has no symptoms except feeling week    Current day of symptoms: 6    If dyspnea, duration of dyspnea: N/A      C) Need for in-person evaluation:    1. Is the patient experiencing respiratory symptoms (dypsnea, orthopnea, dyspnea on exertion, wheezing), worsening respiratory symptoms from baseline, or worsening of a chronic cough? No  2. Has the patient had a fever for > 48 hours? No  3. Is the patient at [place] ? 4 with worsening symptoms? No  4. Does the patient have chest pain (note: new severe chest pain must be referred to ED)? No  5. Is the patient dizzy or lightheaded? No  6. Does the patient have severe sore throat? No  7. Is the patient pregnant? No  8. Is the patient requesting in-person evaluation?  No    Disposition:    1. I have provided the patient with Home Care Advice (use [facility]). Yes     Final disposition: Other: Home care advice, no additional testing needed     COVID HOME CARE ADVICE:    The following instructions are intended for patients who have been have Covid, have a Covid test pending, or who may have Covid but cannot be tested.    Treating symptoms and anticipatory guidance:     Continue to take your prescribed medicines.   Do not take other medicines unless they have been prescribed.   Rest and take plenty of fluids.   For symptomatic relief, use acetaminophen or ibuprofen, as long as you have not been told you cannot take these medicines.   Patients with worsening symptoms should:  ? Call their home clinic phone number.  ? Call 911 in an emergency.    Self-isolation and self-quarantine precautions:    Self-isolation directions for patients:  ? You should not leave home -- no work, school, public places, stores, public transportation. No exceptions  except emergencies.  ? If your household members do not have Covid and you are able to, stay in a separate room from them and mask if in a shared space.  ? Duration of self-isolation:   ? Stay home for AT LEAST 5 days since first day of symptoms or date of positive test. If you have no symptoms or your symptoms are improved on or after Day 5, you can leave your house. You should mask around others for at least 10 days.  ? If you were hospitalized for Covid or are immunosuppressed, we recommend that you stay home for at least 10 days.  ? If you are still awaiting a test and your test comes back negative, you can end isolation if you feel better.   Self-quarantine directions for household members and close contacts:  ? Patients should notify all close contacts that they have symptoms of Covid.  ? All close contacts should be tested.  ? Close contacts should quarantine for 5 days and mask around others for another 5 days if they are unvaccinated or are eligible for a booster and have not received one. Close contacts who are vaccinated/boosted should wear a mask around others for 10 days but do not need to quarantine.  ? If the person who was being tested tests  negative, contacts can end quarantine.    Also refilled suboxone, missed last group

## 2020-05-11 ENCOUNTER — Ambulatory Visit (HOSPITAL_BASED_OUTPATIENT_CLINIC_OR_DEPARTMENT_OTHER): Payer: 59 | Admitting: Specialist

## 2020-05-18 ENCOUNTER — Ambulatory Visit: Payer: 59 | Admitting: Rheumatology

## 2020-05-22 ENCOUNTER — Ambulatory Visit (HOSPITAL_BASED_OUTPATIENT_CLINIC_OR_DEPARTMENT_OTHER): Payer: Self-pay

## 2020-05-22 ENCOUNTER — Ambulatory Visit: Payer: 59 | Attending: Family Medicine | Admitting: Family Medicine

## 2020-05-22 DIAGNOSIS — F1120 Opioid dependence, uncomplicated: Secondary | ICD-10-CM | POA: Diagnosis not present

## 2020-05-22 MED ORDER — BUPRENORPHINE HCL-NALOXONE HCL 8-2 MG SL FILM
ORAL_FILM | SUBLINGUAL | 0 refills | Status: DC
Start: 2020-05-22 — End: 2020-06-05

## 2020-05-22 NOTE — Progress Notes (Signed)
Cheryl Dixon is a 65 year old female seen in follow up for opioid dependence:    Buprenorphine dose: 20mg   Response, adequacy of dose: good  Relapses/close calls: none  Trigger: denies  Meetings: none currently    Social history/events:  Doing well since getting covid, though still with headache. Has had some sleep disruption, as well.  Offered very generous support to other group members who were dealing with recent deaths.    Present Medications:  naproxen (NAPROSYN) 500 MG tablet, TAKE 1 TABLET BY MOUTH WITH FOOD EVERY TWELVE HOURS AS NEEDED FOR PAIN, Disp: 20 tablet, Rfl: 0  [DISCONTINUED] buprenorphine-naloxone (SUBOXONE) 8-2 MG sublingual film, Take two and one-half films under the tongue daily., Disp: 35 Film, Rfl: 0  estradiol (ESTRACE VAGINAL) 0.1 MG/GM vaginal cream, Place 4 g vaginally 3 (three) times a week, Disp: 127.5 g, Rfl: 1  cholecalciferol (HM VITAMIN D3) 1000 UNIT tablet, Take 1,000 Units by mouth daily, Disp: , Rfl:   acetaminophen (TYLENOL) 500 MG tablet, Take 500 mg by mouth every 6 (six) hours as needed for Pain, Disp: , Rfl:   umeclidinium (INCRUSE ELLIPTA) 62.5 MCG/INH inhaler, INHALE 1 PUFF BY MOUTH INTO LUNGS DAILY, Disp: 1 each, Rfl: 11  buPROPion (WELLBUTRIN SR) 200 MG 12 hr tablet, Take 1 tablet by mouth 2 (two) times daily, Disp: 180 tablet, Rfl: 3  doxepin (SINEQUAN) 10 MG capsule, Take 1-2 capsules by mouth daily  for 14 days, Disp: 28 capsule, Rfl: 0  albuterol (VENTOLIN HFA) 108 (90 Base) MCG/ACT inhaler, INHALE 2 PUFFS INTO THE LUNGS EVERY 6 HOURS AS NEEDED FOR WHEEZING OR SHORTNESS OF BREATH, Disp: 8.5 g, Rfl: 11  ipratropium-albuterol (DUO-NEB) 0.5-2.5 (3) MG/3ML SOLN Inhalation Solution, Take 3 mLs by nebulization 4 (four) times daily., Disp: 180 mL, Rfl: 0    No current facility-administered medications on file prior to visit.      PHQ-9 TOTAL SCORE 01/12/2019 06/01/2018 03/03/2017   Doc FlowSheet Total Score - - -   Doc FlowSheet Total Score 10 6 4            PHYSICAL EXAMINATION  via video:  General appearance - healthy female in no distress  Neuro - nonfocal  Affect - normal  Behavior without evidence of intoxication or impairment    PMP Reviewed.  No unauthorized prescriptions since last refill.     Assessment:  Opioid Dependence  Comment: Stable and doing well, now s/p covid infection.  Had not had booster and plans to get booster in 2 weeks.    Plan:  Continue buprenorphine, reviewed criteria for tapering when patient is ready. Discussed sources of support.  Patient is counseled regarding relapse prevention, involvement in recovery groups, potential side effects of buprenorphine, and eventual taper of medication.    For the patient participating in the group via telephone and/or video technology: the patient/guardian has verbally consented to participate in this group therapy session by televisit. The patient/guardian was encouraged to be in a private location due to personal health information being discussed and where they could not be overheard by individuals not participating in the group therapy. Although all [facility] staff/providers participating in this group therapy televisit are in a private location, all the risks of telephone and/or video technology used to conduct this group therapy session cannot be controlled by [place], (i.e. interruptions, unauthorized access/recording and technical difficulties).  Each patient understands that the visit can be stopped at any time for any reason, including if the conferencing connections are inadequate.  Geni Bers, MD, 05/22/2020

## 2020-05-25 ENCOUNTER — Other Ambulatory Visit (HOSPITAL_BASED_OUTPATIENT_CLINIC_OR_DEPARTMENT_OTHER): Payer: Self-pay | Admitting: Internal Medicine

## 2020-05-25 ENCOUNTER — Ambulatory Visit (HOSPITAL_BASED_OUTPATIENT_CLINIC_OR_DEPARTMENT_OTHER): Payer: 59 | Admitting: Specialist

## 2020-05-25 MED ORDER — POLYETHYLENE GLYCOL 3350 17 G PO PACK
PACK | ORAL | 2 refills | Status: DC
Start: 2020-05-25 — End: 2020-08-28

## 2020-05-25 NOTE — Telephone Encounter (Signed)
PER Patient (self), Cheryl Dixon is a 65 year old female has requested a refill of polyethylene glycol (HEALTHYLAX) 17 g packet        Documented patient preferred pharmacies:    [id number] - [place], [place] - [address]  Phone: [phone] Fax: [phone]

## 2020-06-05 ENCOUNTER — Other Ambulatory Visit (HOSPITAL_BASED_OUTPATIENT_CLINIC_OR_DEPARTMENT_OTHER): Payer: Self-pay | Admitting: Internal Medicine

## 2020-06-05 ENCOUNTER — Ambulatory Visit: Payer: 59 | Attending: Family Medicine | Admitting: Family Medicine

## 2020-06-05 DIAGNOSIS — F1120 Opioid dependence, uncomplicated: Secondary | ICD-10-CM | POA: Insufficient documentation

## 2020-06-05 MED ORDER — BUPRENORPHINE HCL-NALOXONE HCL 8-2 MG SL FILM
ORAL_FILM | SUBLINGUAL | 0 refills | Status: DC
Start: 2020-06-05 — End: 2020-06-19

## 2020-06-05 NOTE — Telephone Encounter (Signed)
med has been discontinued

## 2020-06-05 NOTE — Progress Notes (Signed)
Cheryl Dixon is a 65 year old female seen in follow up for opioid dependence:    Buprenorphine dose: 20mg   Response, adequacy of dose: good  Relapses/close calls: none  Trigger: denies  Meetings: none currently    Social history/events:  Has been thinking about how addiction affects relationships in family and the experience of being a kid with parents who were struggling.    Present Medications:  polyethylene glycol (HEALTHYLAX) 17 g packet, TAKE 17G BY MOUTH DAILY MIX IN 64 OUNCE OF CLEAR LIQUID AND TAKE as directed, Disp: 28 packet, Rfl: 2  buprenorphine-naloxone (SUBOXONE) 8-2 MG sublingual film, Take two and one-half films under the tongue daily., Disp: 35 Film, Rfl: 0  naproxen (NAPROSYN) 500 MG tablet, TAKE 1 TABLET BY MOUTH WITH FOOD EVERY TWELVE HOURS AS NEEDED FOR PAIN, Disp: 20 tablet, Rfl: 0  estradiol (ESTRACE VAGINAL) 0.1 MG/GM vaginal cream, Place 4 g vaginally 3 (three) times a week, Disp: 127.5 g, Rfl: 1  cholecalciferol (HM VITAMIN D3) 1000 UNIT tablet, Take 1,000 Units by mouth daily, Disp: , Rfl:   acetaminophen (TYLENOL) 500 MG tablet, Take 500 mg by mouth every 6 (six) hours as needed for Pain, Disp: , Rfl:   umeclidinium (INCRUSE ELLIPTA) 62.5 MCG/INH inhaler, INHALE 1 PUFF BY MOUTH INTO LUNGS DAILY, Disp: 1 each, Rfl: 11  buPROPion (WELLBUTRIN SR) 200 MG 12 hr tablet, Take 1 tablet by mouth 2 (two) times daily, Disp: 180 tablet, Rfl: 3  doxepin (SINEQUAN) 10 MG capsule, Take 1-2 capsules by mouth daily  for 14 days, Disp: 28 capsule, Rfl: 0  albuterol (VENTOLIN HFA) 108 (90 Base) MCG/ACT inhaler, INHALE 2 PUFFS INTO THE LUNGS EVERY 6 HOURS AS NEEDED FOR WHEEZING OR SHORTNESS OF BREATH, Disp: 8.5 g, Rfl: 11  ipratropium-albuterol (DUO-NEB) 0.5-2.5 (3) MG/3ML SOLN Inhalation Solution, Take 3 mLs by nebulization 4 (four) times daily., Disp: 180 mL, Rfl: 0    No current facility-administered medications on file prior to visit.      PHQ-9 TOTAL SCORE 01/12/2019 06/01/2018 03/03/2017   Doc FlowSheet  Total Score - - -   Doc FlowSheet Total Score 10 6 4            PHYSICAL EXAMINATION via video:  General appearance - healthy female in no distress  Neuro - nonfocal  Affect - normal  Behavior without evidence of intoxication or impairment    PMP Reviewed.  No unauthorized prescriptions since last refill.     Assessment:  Opioid Dependence  Comment: Insightful thoughts about generational influences on addiction and challenges within family.  Plan:  Continue buprenorphine, reviewed criteria for tapering when patient is ready. Discussed sources of support.  Patient is counseled regarding relapse prevention, involvement in recovery groups, potential side effects of buprenorphine, and eventual taper of medication.    For the patient participating in the group via telephone and/or video technology: the patient/guardian has verbally consented to participate in this group therapy session by televisit. The patient/guardian was encouraged to be in a private location due to personal health information being discussed and where they could not be overheard by individuals not participating in the group therapy. Although all [facility] staff/providers participating in this group therapy televisit are in a private location, all the risks of telephone and/or video technology used to conduct this group therapy session cannot be controlled by [place], (i.e. interruptions, unauthorized access/recording and technical difficulties).  Each patient understands that the visit can be stopped at any time for any reason, including if the  conferencing connections are inadequate.       Geni Bers, MD, 06/05/2020

## 2020-06-13 ENCOUNTER — Ambulatory Visit: Payer: 59 | Admitting: Rheumatology

## 2020-06-19 ENCOUNTER — Ambulatory Visit: Payer: 59 | Admitting: Registered Nurse

## 2020-06-19 DIAGNOSIS — F1120 Opioid dependence, uncomplicated: Secondary | ICD-10-CM

## 2020-06-19 MED ORDER — BUPRENORPHINE HCL-NALOXONE HCL 8-2 MG SL FILM
ORAL_FILM | SUBLINGUAL | 0 refills | Status: DC
Start: 2020-06-19 — End: 2020-07-03

## 2020-06-19 NOTE — Progress Notes (Addendum)
Office Visit Note  Patient: Michelle Roberts             Date of Birth: July 05, 1955           MRN: [id number]             PCP: Antony Contras, MD Referring: Antony Contras, MD Visit Date: 07/03/2020 Occupation: @GUAROCC @  Subjective:  Pain in multiple joints.   History of Present Illness: Michelle Roberts is a 65 y.o. female returns today after her last visit in October 2018.  At that time she was seen for seropositive rheumatoid arthritis.  On chart review I found that she had an adequate response to Plaquenil and methotrexate.  She had a very good response to Humira and Simponi.  She had IV Remicade which caused increased infections and Xeljanz because increased injection.  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  She had resection of parotid gland and the margins were clear.  She started going to integrative therapies and tried a lot of natural medications for pain management.  She was on naltrexone and meloxicam.  She states she also tried water aerobics but had to discontinue due to irritation from chlorine.  She tried physical therapy.  She states she had intermittent steroid doses by her PCP.  Her PCP also tried Plaquenil in September 2021 for a month but had to be stopped as it interfered with  Metformin.  She had been under care of Dr.Xu.  She had right tarsometatarsal arthrodesis in 2019.  She had right shoulder joint injection for bursitis without much relief.  She states she continues to have pain and discomfort in her arms, hips and her lower back.  She states that her wrist joints feel weak and she has intermittent swelling in her ankles.  She also has neuropathy in her hands.  She has not noticed any joint swelling.  She continues to have pain and discomfort from fibromyalgia.  She gives history of dry mouth and dry eyes.  She states she had plugging of the lacrimal duct.  Activities of Daily Living:   Patient reports morning stiffness for all day.   Patient Reports nocturnal pain.   Difficulty dressing/grooming: Denies Difficulty climbing stairs: Reports Difficulty getting out of chair: Reports Difficulty using hands for taps, buttons, cutlery, and/or writing: Reports  Review of Systems  Constitutional: Negative for fatigue, night sweats, weight gain and weight loss.  HENT: Positive for mouth dryness. Negative for mouth sores, trouble swallowing, trouble swallowing and nose dryness.   Eyes: Positive for dryness. Negative for pain, redness, itching and visual disturbance.  Respiratory: Negative for cough, shortness of breath and difficulty breathing.   Cardiovascular: Positive for palpitations. Negative for chest pain, hypertension, irregular heartbeat and swelling in legs/feet.  Gastrointestinal: Negative for blood in stool, constipation and diarrhea.  Endocrine: Negative for increased urination.  Genitourinary: Negative for difficulty urinating and vaginal dryness.  Musculoskeletal: Positive for arthralgias, joint pain, myalgias, muscle weakness, morning stiffness, muscle tenderness and myalgias. Negative for joint swelling.  Skin: Negative for color change, rash, hair loss, redness, skin tightness, ulcers and sensitivity to sunlight.  Allergic/Immunologic: Negative for susceptible to infections.  Neurological: Positive for dizziness and numbness. Negative for headaches, memory loss, night sweats and weakness.  Hematological: Negative for bruising/bleeding tendency and swollen glands.  Psychiatric/Behavioral: Positive for sleep disturbance. Negative for depressed mood and confusion. The patient is not nervous/anxious.  PMFS History:  Patient Active Problem List   Diagnosis Date Noted  . History of gout 07/03/2020  . Hallux rigidus, right foot 02/03/2018  . Cancer of parotid gland ([place]) 02/20/2017  . Fibromyalgia 08/12/2016  . High risk medication use 08/12/2016  . Primary  osteoarthritis of both hands 08/12/2016  . Acute midline low back pain 08/12/2016  . DDD (degenerative disc disease), lumbar 08/12/2016  . History of hypertension 08/12/2016  . History of high cholesterol 08/12/2016  . Thyroid ca ([place]) 08/12/2016  . History of hypothyroidism 08/12/2016  . History of gastroesophageal reflux (GERD) 08/12/2016  . History of TMJ syndrome 08/12/2016  . Burning tongue syndrome 08/12/2016  . History of vitamin D deficiency 08/12/2016  . Other sleep apnea 08/12/2016  . Vitamin D deficiency 08/12/2016  . Rheumatoid arthritis with rheumatoid factor of multiple sites without organ or systems involvement ([place]) 12/31/2015  . Goals of care, counseling/discussion 06/02/2013  . Myofascial muscle pain 06/02/2013  . Narcotic-induced mood disorder ([place]) 06/02/2013  . Inflammatory arthritis 04/08/2011  . Plantar fasciitis 04/08/2011  . Yeast infection 04/08/2011    Past Medical History:  Diagnosis Date  . Anxiety   . Arthritis   . Depression   . Fibromyalgia   . GERD (gastroesophageal reflux disease)   . Hyperlipidemia   . Hypertension   . Hypothyroidism    thyroidectomy  . Morton neuroma, right    per patient   . Neoplasm of parotid gland   . Neuromuscular disorder ([place])   . Rheumatoid aortitis   . Sleep apnea    uses CPAP sometimes    Family History  Problem Relation Age of Onset  . Alzheimer's disease Mother   . Alzheimer's disease Sister   . Diabetes Brother   . High Cholesterol Brother   . Hypertension Brother   . Diabetes Sister   . Hypertension Sister   . High Cholesterol Sister   . Diabetes Sister   . Hypertension Sister   . High Cholesterol Sister   . Diabetes Brother   . Hypertension Brother   . High Cholesterol Brother   . Healthy Daughter   . Healthy Daughter    Past Surgical History:  Procedure Laterality Date  . ABDOMINAL HYSTERECTOMY    . CHOLECYSTECTOMY    . HAMMER TOE SURGERY    . MASS EXCISION N/A 11/17/2017   Procedure:  EXCISION SOFT PALATE LESION;  Surgeon: Rozetta Nunnery, MD;  Location: [facility];  Service: ENT;  Laterality: N/A;  . PAROTIDECTOMY Left 04/08/2016   Procedure: LEFT SUPERFICIAL PAROTIDECTOMY WITH FACIAL NERVE DISECTION;  Surgeon: Rozetta Nunnery, MD;  Location: [facility];  Service: ENT;  Laterality: Left;  . TARSAL METATARSAL ARTHRODESIS Right 02/03/2018   Procedure: RIGHT GREAT TOE  FUSION;  Surgeon: Leandrew Koyanagi, MD;  Location: [facility];  Service: Orthopedics;  Laterality: Right;  . THYROIDECTOMY     Social History   Social History Narrative  . Not on file   Immunization History  Administered Date(s) Administered  . PFIZER(Purple Top)SARS-COV-2 Vaccination 07/14/2019, 08/09/2019, 02/20/2020     Objective: Vital Signs: BP (!) 165/88 (BP Location: Right Arm, Patient Position: Sitting, Cuff Size: Large)   Pulse (!) 114   Resp 15   Ht 4' 10.75" (1.492 m)   Wt 208 lb 6.4 oz (94.5 kg)   BMI 42.45 kg/m    Physical Exam Vitals and nursing note reviewed.  Constitutional:      Appearance: She is  well-developed and well-nourished.  HENT:     Head: Normocephalic and atraumatic.  Eyes:     Extraocular Movements: EOM normal.     Conjunctiva/sclera: Conjunctivae normal.  Cardiovascular:     Rate and Rhythm: Normal rate and regular rhythm.     Pulses: Intact distal pulses.     Heart sounds: Normal heart sounds.  Pulmonary:     Effort: Pulmonary effort is normal.     Breath sounds: Normal breath sounds.  Abdominal:     General: Bowel sounds are normal.     Palpations: Abdomen is soft.  Musculoskeletal:     Cervical back: Normal range of motion.  Lymphadenopathy:     Cervical: No cervical adenopathy.  Skin:    General: Skin is warm and dry.     Capillary Refill: Capillary refill takes less than 2 seconds.  Neurological:     Mental Status: She is alert and oriented to person, place, and time.  Psychiatric:        Mood  and Affect: Mood and affect normal.        Behavior: Behavior normal.      Musculoskeletal Exam: C-spine was in good range of motion.  She had limited painful range of motion of her lumbar spine.  She had painful range of motion of her right shoulder.  Left shoulder joint was in good range of motion.  Elbow joints with good range of motion.  She had no synovitis of her wrist joints or MCPs.  She has bilateral PIP and DIP thickening.  She had good range of motion of bilateral hip joints and knee joints.  There is no warmth swelling or effusion in her knee joints.  There was no tenderness over ankles or MTPs.  She has generalized hyperalgesia and positive tender points.  CDAI Exam: CDAI Score: -- Patient Global: --; Provider Global: -- Swollen: --; Tender: -- Joint Exam 07/03/2020   No joint exam has been documented for this visit   There is currently no information documented on the homunculus. Go to the Rheumatology activity and complete the homunculus joint exam.  Investigation: No additional findings.  Imaging: XR Hand 2 View Left  Result Date: 07/03/2020 CMC, severe PIP and DIP narrowing was noted.  No MCP, intercarpal or radiocarpal joint space narrowing was noted.  No erosive changes were noted. Impression: These findings are consistent with osteoarthritis of the hand.  XR Hand 2 View Right  Result Date: 07/03/2020 CMC, severe PIP and DIP narrowing was noted.  No MCP, intercarpal or radiocarpal joint space narrowing was noted.  No erosive changes were noted. Impression: These findings are consistent with osteoarthritis of the hand.   Recent Labs: Lab Results  Component Value Date   WBC 9.1 01/08/2016   HGB 15.6 (H) 02/03/2018   PLT 248 01/08/2016   NA 140 02/03/2018   K 3.6 02/03/2018   CL 101 02/03/2018   CO2 24 02/01/2018   GLUCOSE 123 (H) 02/03/2018   BUN 14 02/03/2018   CREATININE 0.80 02/03/2018   BILITOT 0.6 01/08/2016   ALKPHOS 71 01/08/2016   AST 32 01/08/2016    ALT 22 01/08/2016   PROT 7.0 01/08/2016   ALBUMIN 3.7 01/08/2016   CALCIUM 8.8 (L) 02/01/2018   GFRAA >60 02/01/2018   QFTBGOLD Negative 11/28/2014   September 2017 TB gold was negative  Labs drawn yesterday by her PCP include CBC with differential, TSH, iron studies, CMP, vitamin D  Speciality Comments: Inadequate response to Plaquenil, methotrexate, Humira,  Simponi Remicade and Morrie Sheldon caused frequent infections Orencia IV March 2017 had a stopped in 2018 after parotid cancer  Procedures:  No procedures performed Allergies: Epinephrine   Assessment / Plan:     Visit Diagnoses: Rheumatoid arthritis with rheumatoid factor of multiple sites without organ or systems involvement ([place]) -patient was diagnosed with rheumatoid arthritis several years ago.  She was seen last in the office in 2018.  She failed Plaquenil, methotrexate in the past.  She had an inadequate response to Humira and Simponi.  She had frequent infections on Remicade and Morrie Sheldon which was a stopped.  She was finally started on Orencia IV in March 2017 which was a stopped in 2018 after she developed parotid cancer.  She had been seeing her PCP since then and had been getting steroid doses as needed.  Patient states she continues to have pain and swelling in her joints.  No synovitis was noted on the examination today.  I also reviewed MRI of her foot done by Dr. Erlinda Hong which did not show any synovitis.  Sicca complex ([place]) - History of dry mouth and dry eyes.  History of lacrimal duct plugging.  She also has burning mouth syndrome.  I will obtain [facility] labs.  Her sicca symptoms could be related to medications.  Primary osteoarthritis of both hands-she has severe osteoarthritis in her hands with DIP and PIP prominence.  Pain in both hands - Plan: XR Hand 2 View Right, XR Hand 2 View Left.  X-rays were consistent with severe osteoarthritis.  Right shoulder pain-patient was evaluated by Dr. Erlinda Hong in December 2021 and had right  subacromial injection for bursitis.  Patient states she had no response to the cortisone injection.  She has an appointment with Dr. Erlinda Hong next week.  Have advised her to discuss this further with Dr. Erlinda Hong.  DDD (degenerative disc disease), lumbar-she has severe lower back pain.  History of gout-patient has been treated with allopurinol by her PCP.  History of TMJ syndrome  Fibromyalgia-she has generalized hyperalgesia and positive tender points.  She continues to have generalized pain from fibromyalgia.  Cancer of parotid gland [facility]) - Left parotid 2017  History of thyroid cancer - 2006  History of hypothyroidism  History of hypertension-her blood pressure is a still elevated.  Dyslipidemia  History of diabetes mellitus-new diagnosis per patient.  History of gastroesophageal reflux (GERD)  History of asthma  History of vitamin D deficiency  Other sleep apnea  History of hearing loss  Vulvodynia    Orders: Orders Placed This Encounter  Procedures  . XR Hand 2 View Right  . XR Hand 2 View Left   No orders of the defined types were placed in this encounter.    Follow-Up Instructions: Return for +RF, FMS.   Bo Merino, MD  Note - This record has been created using Editor, commissioning.  Chart creation errors have been sought, but may not always  have been located. Such creation errors do not reflect on  the standard of medical care.

## 2020-07-02 ENCOUNTER — Telehealth: Payer: Self-pay | Admitting: Orthopaedic Surgery

## 2020-07-02 NOTE — Telephone Encounter (Signed)
Patient called with an [place]. Patient states she wil be seeing Dr. Brett Canales and if she is able to do her injection she will call and cancel appt after her schedule appt and wanted to notify us. No call back needed.

## 2020-07-03 ENCOUNTER — Ambulatory Visit: Payer: 59 | Admitting: Family Medicine

## 2020-07-03 ENCOUNTER — Ambulatory Visit: Payer: 59 | Admitting: Rheumatology

## 2020-07-03 ENCOUNTER — Encounter: Payer: Self-pay | Admitting: Rheumatology

## 2020-07-03 ENCOUNTER — Ambulatory Visit: Payer: Self-pay

## 2020-07-03 ENCOUNTER — Other Ambulatory Visit: Payer: Self-pay

## 2020-07-03 VITALS — BP 165/88 | HR 114 | Resp 15 | Ht 58.75 in | Wt 208.4 lb

## 2020-07-03 DIAGNOSIS — F1120 Opioid dependence, uncomplicated: Secondary | ICD-10-CM

## 2020-07-03 DIAGNOSIS — Z5321 Procedure and treatment not carried out due to patient leaving prior to being seen by health care provider: Secondary | ICD-10-CM

## 2020-07-03 DIAGNOSIS — Z8679 Personal history of other diseases of the circulatory system: Secondary | ICD-10-CM

## 2020-07-03 DIAGNOSIS — N94819 Vulvodynia, unspecified: Secondary | ICD-10-CM

## 2020-07-03 DIAGNOSIS — M3500 Sicca syndrome, unspecified: Secondary | ICD-10-CM | POA: Diagnosis not present

## 2020-07-03 DIAGNOSIS — C07 Malignant neoplasm of parotid gland: Secondary | ICD-10-CM

## 2020-07-03 DIAGNOSIS — M79641 Pain in right hand: Secondary | ICD-10-CM

## 2020-07-03 DIAGNOSIS — M0579 Rheumatoid arthritis with rheumatoid factor of multiple sites without organ or systems involvement: Secondary | ICD-10-CM

## 2020-07-03 DIAGNOSIS — Z8739 Personal history of other diseases of the musculoskeletal system and connective tissue: Secondary | ICD-10-CM

## 2020-07-03 DIAGNOSIS — M19041 Primary osteoarthritis, right hand: Secondary | ICD-10-CM | POA: Diagnosis not present

## 2020-07-03 DIAGNOSIS — M19042 Primary osteoarthritis, left hand: Secondary | ICD-10-CM

## 2020-07-03 DIAGNOSIS — G8929 Other chronic pain: Secondary | ICD-10-CM | POA: Diagnosis not present

## 2020-07-03 DIAGNOSIS — Z8719 Personal history of other diseases of the digestive system: Secondary | ICD-10-CM

## 2020-07-03 DIAGNOSIS — M797 Fibromyalgia: Secondary | ICD-10-CM

## 2020-07-03 DIAGNOSIS — Z85850 Personal history of malignant neoplasm of thyroid: Secondary | ICD-10-CM

## 2020-07-03 DIAGNOSIS — M5136 Other intervertebral disc degeneration, lumbar region: Secondary | ICD-10-CM

## 2020-07-03 DIAGNOSIS — F1194 Opioid use, unspecified with opioid-induced mood disorder: Secondary | ICD-10-CM

## 2020-07-03 DIAGNOSIS — Z8639 Personal history of other endocrine, nutritional and metabolic disease: Secondary | ICD-10-CM

## 2020-07-03 DIAGNOSIS — M25511 Pain in right shoulder: Secondary | ICD-10-CM

## 2020-07-03 DIAGNOSIS — M79642 Pain in left hand: Secondary | ICD-10-CM

## 2020-07-03 DIAGNOSIS — E785 Hyperlipidemia, unspecified: Secondary | ICD-10-CM

## 2020-07-03 DIAGNOSIS — Z8669 Personal history of other diseases of the nervous system and sense organs: Secondary | ICD-10-CM

## 2020-07-03 DIAGNOSIS — Z8709 Personal history of other diseases of the respiratory system: Secondary | ICD-10-CM

## 2020-07-03 DIAGNOSIS — M722 Plantar fascial fibromatosis: Secondary | ICD-10-CM

## 2020-07-03 DIAGNOSIS — G4739 Other sleep apnea: Secondary | ICD-10-CM

## 2020-07-03 MED ORDER — BUPRENORPHINE HCL-NALOXONE HCL 8-2 MG SL FILM
ORAL_FILM | SUBLINGUAL | 0 refills | Status: DC
Start: 2020-07-03 — End: 2020-07-17

## 2020-07-03 NOTE — Progress Notes (Signed)
Cheryl Dixon is at a dentist appt, so needed to miss group.  Filling script.                         Patient left without being seen.

## 2020-07-05 ENCOUNTER — Ambulatory Visit: Payer: 59 | Admitting: Orthopaedic Surgery

## 2020-07-05 ENCOUNTER — Other Ambulatory Visit: Payer: Self-pay

## 2020-07-05 ENCOUNTER — Encounter: Payer: Self-pay | Admitting: Orthopaedic Surgery

## 2020-07-05 VITALS — Ht 58.75 in | Wt 208.0 lb

## 2020-07-05 DIAGNOSIS — M25511 Pain in right shoulder: Secondary | ICD-10-CM | POA: Diagnosis not present

## 2020-07-05 DIAGNOSIS — M545 Low back pain, unspecified: Secondary | ICD-10-CM

## 2020-07-05 DIAGNOSIS — R634 Abnormal weight loss: Secondary | ICD-10-CM | POA: Diagnosis not present

## 2020-07-05 DIAGNOSIS — M5412 Radiculopathy, cervical region: Secondary | ICD-10-CM | POA: Diagnosis not present

## 2020-07-05 DIAGNOSIS — G8929 Other chronic pain: Secondary | ICD-10-CM

## 2020-07-05 NOTE — Progress Notes (Signed)
Office Visit Note   Patient: Michelle Roberts           Date of Birth: March 05, 1956           MRN: [id number] Visit Date: 07/05/2020              Requested by: Antony Contras, MD [address],  [place] [address] PCP: Antony Contras, MD   Assessment & Plan: Visit Diagnoses:  1. Chronic low back pain, unspecified back pain laterality, unspecified whether sciatica present   2. Cervical radiculopathy   3. Chronic right shoulder pain     Plan: Impression is chronic right shoulder pain concerning for underlying cervical spine pathology.  At this point, she has tried subacromial injection and steroids without relief.  We have discussed obtaining an MRI of her right shoulder and her cervical spine.  She will follow up once that has been completed.  We have also discussed physical therapy and an external referral has been made.  Follow-Up Instructions: Return for after MRI.   Orders:  Orders Placed This Encounter  Procedures  . Ambulatory referral to Physical Therapy   No orders of the defined types were placed in this encounter.     Procedures: No procedures performed   Clinical Data: No additional findings.   Subjective: Chief Complaint  Patient presents with  . Right Shoulder - Pain    HPI patient is a pleasant 65 year old female who comes in today with continued right shoulder and total body pain.  She does have underlying fibromyalgia and rheumatoid arthritis.  She was seen in our office for her shoulder pain little while back where subacromial injection was performed.  She denied any relief following the injection.  She has also tried a steroid taper in the past without relief.  She continues to have pain from the lateral neck down her entire arm.  This is worse with abduction of the shoulder.  She does note chronic pain to the left shoulder as well as her back and neck as well.  She is on a few medications for her fibromyalgia but is unable to take any  Biologics from Dr. Estanislado Pandy as she was told that this could cause recurrence of her cancer.  She does note paresthesias into her hands and is unsure whether this came from cancer treatment for her underlying diabetes.  Review of Systems as detailed in HPI.  All others reviewed and are negative.   Objective: Vital Signs: Ht 4' 10.75" (1.492 m)   Wt 208 lb (94.3 kg)   BMI 42.37 kg/m   Physical Exam well-developed well-nourished female no acute distress.  Alert oriented x3.  Ortho Exam right shoulder exam reveals near full range of motion.  She does have very minimal pain with empty can test.  She does have mild to moderate tenderness over the [facility] joint.  She has tenderness throughout the right parascapular region and into the lateral neck.  No spinous tenderness.  She has slight pain and stiffness with range of motion of the neck.  No focal weakness.  She is neurovascular intact distally.  Specialty Comments:  No specialty comments available.  Imaging: No new imaging   PMFS History: Patient Active Problem List   Diagnosis Date Noted  . History of gout 07/03/2020  . Hallux rigidus, right foot 02/03/2018  . 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 Fibromyalgia 08/12/2016  . High risk medication use 08/12/2016  . Primary osteoarthritis of both hands  08/12/2016  . Acute midline low back pain 08/12/2016  . DDD (degenerative disc disease), lumbar 08/12/2016  . History of hypertension 08/12/2016  . History of high cholesterol 08/12/2016  . Thyroid ca ([place]) 08/12/2016  . History of hypothyroidism 08/12/2016  . History of gastroesophageal reflux (GERD) 08/12/2016  . History of TMJ syndrome 08/12/2016  . Burning tongue syndrome 08/12/2016  . History of vitamin D deficiency 08/12/2016  . Other sleep apnea 08/12/2016  . Vitamin D deficiency 08/12/2016  . Rheumatoid arthritis with rheumatoid factor of multiple sites without organ or systems involvement ([place]) 12/31/2015  . Goals of  care, counseling/discussion 06/02/2013  . Myofascial muscle pain 06/02/2013  . Narcotic-induced mood disorder ([place]) 06/02/2013  . Inflammatory arthritis 04/08/2011  . Plantar fasciitis 04/08/2011  . Yeast infection 04/08/2011   Past Medical History:  Diagnosis Date  . Anxiety   . Arthritis   . Depression   . Fibromyalgia   . GERD (gastroesophageal reflux disease)   . Hyperlipidemia   . Hypertension   . Hypothyroidism    thyroidectomy  . Morton neuroma, right    per patient   . Neoplasm of parotid gland   . Neuromuscular disorder ([place])   . Rheumatoid aortitis   . Sleep apnea    uses CPAP sometimes    Family History  Problem Relation Age of Onset  . Alzheimer's disease Mother   . Alzheimer's disease Sister   . Diabetes Brother   . High Cholesterol Brother   . Hypertension Brother   . Diabetes Sister   . Hypertension Sister   . High Cholesterol Sister   . Diabetes Sister   . Hypertension Sister   . High Cholesterol Sister   . Diabetes Brother   . Hypertension Brother   . High Cholesterol Brother   . Healthy Daughter   . Healthy Daughter     Past Surgical History:  Procedure Laterality Date  . ABDOMINAL HYSTERECTOMY    . CHOLECYSTECTOMY    . HAMMER TOE SURGERY    . MASS EXCISION N/A 11/17/2017   Procedure: EXCISION SOFT PALATE LESION;  Surgeon: Rozetta Nunnery, MD;  Location: [facility];  Service: ENT;  Laterality: N/A;  . PAROTIDECTOMY Left 04/08/2016   Procedure: LEFT SUPERFICIAL PAROTIDECTOMY WITH FACIAL NERVE DISECTION;  Surgeon: Rozetta Nunnery, MD;  Location: [facility];  Service: ENT;  Laterality: Left;  . TARSAL METATARSAL ARTHRODESIS Right 02/03/2018   Procedure: RIGHT GREAT TOE  FUSION;  Surgeon: Leandrew Koyanagi, MD;  Location: [facility];  Service: Orthopedics;  Laterality: Right;  . THYROIDECTOMY     Social History   Occupational History  . Not on file  Tobacco Use  . Smoking status: Never  Smoker  . Smokeless tobacco: Never Used  Vaping Use  . Vaping Use: Never used  Substance and Sexual Activity  . Alcohol use: No  . Drug use: No  . Sexual activity: Not on file

## 2020-07-06 NOTE — Addendum Note (Signed)
Addended by: Precious Bard on: 07/06/2020 08:54 AM   Modules accepted: Orders

## 2020-07-17 ENCOUNTER — Ambulatory Visit: Payer: 59 | Attending: Family Medicine | Admitting: Family Medicine

## 2020-07-17 ENCOUNTER — Other Ambulatory Visit (HOSPITAL_BASED_OUTPATIENT_CLINIC_OR_DEPARTMENT_OTHER): Payer: Self-pay | Admitting: Internal Medicine

## 2020-07-17 DIAGNOSIS — N952 Postmenopausal atrophic vaginitis: Secondary | ICD-10-CM

## 2020-07-17 DIAGNOSIS — F1120 Opioid dependence, uncomplicated: Secondary | ICD-10-CM | POA: Insufficient documentation

## 2020-07-17 MED ORDER — BUPRENORPHINE HCL-NALOXONE HCL 8-2 MG SL FILM
ORAL_FILM | SUBLINGUAL | 0 refills | Status: DC
Start: 2020-07-17 — End: 2020-07-31

## 2020-07-17 MED ORDER — ESTRADIOL 0.1 MG/GM VA CREA
4.0000 g | TOPICAL_CREAM | VAGINAL | 1 refills | Status: DC
Start: 2020-07-18 — End: 2020-10-01

## 2020-07-17 NOTE — Progress Notes (Signed)
Cheryl Dixon is a 65 year old female seen in follow up for opioid dependence:    Buprenorphine dose: 20mg   Response, adequacy of dose: good  Relapses/close calls: none  Trigger: denies  Meetings: none currently    Social history/events:  Nephew died, and that has been really hard.  Coping okay.    Present Medications:  [DISCONTINUED] buprenorphine-naloxone (SUBOXONE) 8-2 MG sublingual film, Take two and one-half films under the tongue daily., Disp: 35 Film, Rfl: 0  polyethylene glycol (HEALTHYLAX) 17 g packet, TAKE 17G BY MOUTH DAILY MIX IN 64 OUNCE OF CLEAR LIQUID AND TAKE as directed, Disp: 28 packet, Rfl: 2  naproxen (NAPROSYN) 500 MG tablet, TAKE 1 TABLET BY MOUTH WITH FOOD EVERY TWELVE HOURS AS NEEDED FOR PAIN, Disp: 20 tablet, Rfl: 0  estradiol (ESTRACE VAGINAL) 0.1 MG/GM vaginal cream, Place 4 g vaginally 3 (three) times a week, Disp: 127.5 g, Rfl: 1  cholecalciferol (HM VITAMIN D3) 1000 UNIT tablet, Take 1,000 Units by mouth daily, Disp: , Rfl:   acetaminophen (TYLENOL) 500 MG tablet, Take 500 mg by mouth every 6 (six) hours as needed for Pain, Disp: , Rfl:   umeclidinium (INCRUSE ELLIPTA) 62.5 MCG/INH inhaler, INHALE 1 PUFF BY MOUTH INTO LUNGS DAILY, Disp: 1 each, Rfl: 11  buPROPion (WELLBUTRIN SR) 200 MG 12 hr tablet, Take 1 tablet by mouth 2 (two) times daily, Disp: 180 tablet, Rfl: 3  doxepin (SINEQUAN) 10 MG capsule, Take 1-2 capsules by mouth daily  for 14 days, Disp: 28 capsule, Rfl: 0  albuterol (VENTOLIN HFA) 108 (90 Base) MCG/ACT inhaler, INHALE 2 PUFFS INTO THE LUNGS EVERY 6 HOURS AS NEEDED FOR WHEEZING OR SHORTNESS OF BREATH, Disp: 8.5 g, Rfl: 11  ipratropium-albuterol (DUO-NEB) 0.5-2.5 (3) MG/3ML SOLN Inhalation Solution, Take 3 mLs by nebulization 4 (four) times daily., Disp: 180 mL, Rfl: 0    No current facility-administered medications on file prior to visit.      PHQ-9 TOTAL SCORE 01/12/2019 06/01/2018 03/03/2017   Doc FlowSheet Total Score - - -   Doc FlowSheet Total Score 10 6 4             PHYSICAL EXAMINATION via video:  General appearance - healthy female in no distress  Neuro - nonfocal  Affect - normal  Behavior without evidence of intoxication or impairment    PMP Reviewed.  No unauthorized prescriptions since last refill.     Assessment:  Opioid Dependence  Comment: Support around recent loss.  She plans to reach out to one of the other group members after group.  Plan:  Continue buprenorphine, reviewed criteria for tapering when patient is ready. Discussed sources of support.  Patient is counseled regarding relapse prevention, involvement in recovery groups, potential side effects of buprenorphine, and eventual taper of medication.    For the patient participating in the group via telephone and/or video technology: the patient/guardian has verbally consented to participate in this group therapy session by televisit. The patient/guardian was encouraged to be in a private location due to personal health information being discussed and where they could not be overheard by individuals not participating in the group therapy. Although all [facility] staff/providers participating in this group therapy televisit are in a private location, all the risks of telephone and/or video technology used to conduct this group therapy session cannot be controlled by [place], (i.e. interruptions, unauthorized access/recording and technical difficulties).  Each patient understands that the visit can be stopped at any time for any reason, including if the conferencing connections  are inadequate.       Geni Bers, MD, 07/17/2020

## 2020-07-17 NOTE — Telephone Encounter (Signed)
PER Patient (self), Cheryl Dixon is a 65 year old female has requested a refill of      -  estradiol       Last Office Visit: 07/18/2019 with Mickey Farber  Last Physical Exam: 05/27/2013     COLONOSCOPY due on 08/22/2017     Other Med Adult:  Most Recent BP Reading(s)  03/19/20 : 132/86        Cholesterol (mg/dl)   Date Value   01/04/2010 264 (H)     LOW DENSITY LIPOPROTEIN DIRECT (mg/dl)   Date Value   01/04/2010 101 (H)     HIGH DENSITY LIPOPROTEIN (mg/dl)   Date Value   01/04/2010 38     No results found for: TG      THYROID SCREEN TSH REFLEX FT4 (uIU/mL)   Date Value   01/12/2019 1.630         No results found for: TSH    HEMOGLOBIN A1C (%)   Date Value   01/12/2019 4.6       No results found for: POCA1C      INR (no units)   Date Value   02/16/2007 1.0 (L)   07/03/2006 < 1.0 (L)       SODIUM (mmol/L)   Date Value   03/01/2020 144       POTASSIUM (mmol/L)   Date Value   03/01/2020 4.3           CREATININE (mg/dL)   Date Value   03/01/2020 1.1        Documented patient preferred pharmacies:    [id number] - [place], [place] [facility] [address]  Phone: [phone] Fax: [phone]

## 2020-07-20 NOTE — Progress Notes (Signed)
Office Visit Note  Patient: Michelle Roberts             Date of Birth: 05/01/1955           MRN: [id number]             PCP: Antony Contras, MD Referring: Antony Contras, MD Visit Date: 07/25/2020 Occupation: @GUAROCC @  Subjective:  Pain in multiple joints.   History of Present Illness: Michelle Roberts is a 65 y.o. female with history of seropositive rheumatoid arthritis, osteoarthritis and degenerative disc disease.  She states she continues to have pain in her cervical region and her right shoulder.  She was seen by Dr. Sherrian Divers who ordered MRI of her cervical spine and her right shoulder joint.  He also referred her to integrative therapies and she has had some dry needling to her right shoulder.  She states the pain has eased off to some extent.  She has not seen any joint swelling.  Continues to have some discomfort in her knee joints and her feet.  Activities of Daily Living:  Patient reports morning stiffness for 20 minutes.   Patient Reports nocturnal pain.  Difficulty dressing/grooming: Denies Difficulty climbing stairs: Denies Difficulty getting out of chair: Denies Difficulty using hands for taps, buttons, cutlery, and/or writing: Reports  Review of Systems  Constitutional: Positive for fatigue.  HENT: Positive for mouth dryness. Negative for mouth sores and nose dryness.   Eyes: Positive for dryness. Negative for pain and itching.  Respiratory: Negative for shortness of breath and difficulty breathing.   Cardiovascular: Negative for chest pain and palpitations.  Gastrointestinal: Negative for blood in stool, constipation and diarrhea.  Endocrine: Negative for increased urination.  Genitourinary: Negative for difficulty urinating.  Musculoskeletal: Positive for arthralgias, joint pain, myalgias, morning stiffness, muscle tenderness and myalgias. Negative for joint swelling.  Skin: Negative for color change, rash and redness.  Allergic/Immunologic: Positive for susceptible  to infections.  Neurological: Positive for dizziness, numbness and headaches. Negative for memory loss.  Hematological: Positive for bruising/bleeding tendency.  Psychiatric/Behavioral: Negative for confusion.    PMFS History:  Patient Active Problem List   Diagnosis Date Noted  . History of gout 07/03/2020  . Hallux rigidus, right foot 02/03/2018  . 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 Fibromyalgia 08/12/2016  . High risk medication use 08/12/2016  . Primary osteoarthritis of both hands 08/12/2016  . Acute midline low back pain 08/12/2016  . DDD (degenerative disc disease), lumbar 08/12/2016  . History of hypertension 08/12/2016  . History of high cholesterol 08/12/2016  . Thyroid ca ([place]) 08/12/2016  . History of hypothyroidism 08/12/2016  . History of gastroesophageal reflux (GERD) 08/12/2016  . History of TMJ syndrome 08/12/2016  . Burning tongue syndrome 08/12/2016  . History of vitamin D deficiency 08/12/2016  . Other sleep apnea 08/12/2016  . Vitamin D deficiency 08/12/2016  . Rheumatoid arthritis with rheumatoid factor of multiple sites without organ or systems involvement ([place]) 12/31/2015  . Goals of care, counseling/discussion 06/02/2013  . Myofascial muscle pain 06/02/2013  . Narcotic-induced mood disorder ([place]) 06/02/2013  . Inflammatory arthritis 04/08/2011  . Plantar fasciitis 04/08/2011  . Yeast infection 04/08/2011    Past Medical History:  Diagnosis Date  . Anxiety   . Arthritis   . Depression   . Fibromyalgia   . GERD (gastroesophageal reflux disease)   . Hyperlipidemia   . Hypertension   . Hypothyroidism    thyroidectomy  . Morton neuroma, right    per  patient   . Neoplasm of parotid gland   . Neuromuscular disorder ([place])   . Rheumatoid aortitis   . Sleep apnea    uses CPAP sometimes    Family History  Problem Relation Age of Onset  . Alzheimer's disease Mother   . Alzheimer's disease Sister   . Diabetes Brother   . High Cholesterol  Brother   . Hypertension Brother   . Diabetes Sister   . Hypertension Sister   . High Cholesterol Sister   . Diabetes Sister   . Hypertension Sister   . High Cholesterol Sister   . Diabetes Brother   . Hypertension Brother   . High Cholesterol Brother   . Healthy Daughter   . Healthy Daughter    Past Surgical History:  Procedure Laterality Date  . ABDOMINAL HYSTERECTOMY    . CHOLECYSTECTOMY    . HAMMER TOE SURGERY    . MASS EXCISION N/A 11/17/2017   Procedure: EXCISION SOFT PALATE LESION;  Surgeon: Rozetta Nunnery, MD;  Location: [facility];  Service: ENT;  Laterality: N/A;  . PAROTIDECTOMY Left 04/08/2016   Procedure: LEFT SUPERFICIAL PAROTIDECTOMY WITH FACIAL NERVE DISECTION;  Surgeon: Rozetta Nunnery, MD;  Location: [facility];  Service: ENT;  Laterality: Left;  . TARSAL METATARSAL ARTHRODESIS Right 02/03/2018   Procedure: RIGHT GREAT TOE  FUSION;  Surgeon: Leandrew Koyanagi, MD;  Location: [facility];  Service: Orthopedics;  Laterality: Right;  . THYROIDECTOMY     Social History   Social History Narrative  . Not on file   Immunization History  Administered Date(s) Administered  . PFIZER(Purple Top)SARS-COV-2 Vaccination 07/14/2019, 08/09/2019, 02/20/2020     Objective: Vital Signs: BP (!) 158/92 (BP Location: Right Wrist, Patient Position: Sitting, Cuff Size: Normal)   Pulse 81   Resp 15   Ht 4\' 11"  (1.499 m)   Wt 208 lb (94.3 kg)   BMI 42.01 kg/m    Physical Exam Vitals and nursing note reviewed.  Constitutional:      Appearance: She is well-developed.  HENT:     Head: Normocephalic and atraumatic.  Eyes:     Conjunctiva/sclera: Conjunctivae normal.  Cardiovascular:     Rate and Rhythm: Normal rate and regular rhythm.     Heart sounds: Normal heart sounds.  Pulmonary:     Effort: Pulmonary effort is normal.     Breath sounds: Normal breath sounds.  Abdominal:     General: Bowel sounds are normal.      Palpations: Abdomen is soft.  Musculoskeletal:     Cervical back: Normal range of motion.  Lymphadenopathy:     Cervical: No cervical adenopathy.  Skin:    General: Skin is warm and dry.     Capillary Refill: Capillary refill takes less than 2 seconds.  Neurological:     Mental Status: She is alert and oriented to person, place, and time.  Psychiatric:        Behavior: Behavior normal.      Musculoskeletal Exam: She has some stiffness with range of motion of her cervical spine.  Pain with range of motion of her right shoulder joint.  Elbow joints, wrist joints, MCPs PIPs and DIPs with good range of motion with no synovitis.  She has PIP and DIP thickening bilaterally with incomplete fist formation.  Hip joints and knee joints with good range of motion.  There was no tenderness over ankles or MTPs.  CDAI Exam: CDAI Score: 1  Patient  Global: 0 mm; Provider Global: 0 mm Swollen: 0 ; Tender: 2  Joint Exam 07/25/2020      Right  Left  Glenohumeral   Tender     Cervical Spine   Tender        Investigation: No additional findings.  Imaging: XR Hand 2 View Left  Result Date: 07/03/2020 CMC, severe PIP and DIP narrowing was noted.  No MCP, intercarpal or radiocarpal joint space narrowing was noted.  No erosive changes were noted. Impression: These findings are consistent with osteoarthritis of the hand.  XR Hand 2 View Right  Result Date: 07/03/2020 CMC, severe PIP and DIP narrowing was noted.  No MCP, intercarpal or radiocarpal joint space narrowing was noted.  No erosive changes were noted. Impression: These findings are consistent with osteoarthritis of the hand.   Recent Labs: Lab Results  Component Value Date   WBC 9.1 01/08/2016   HGB 15.6 (H) 02/03/2018   PLT 248 01/08/2016   NA 140 02/03/2018   K 3.6 02/03/2018   CL 101 02/03/2018   CO2 24 02/01/2018   GLUCOSE 123 (H) 02/03/2018   BUN 14 02/03/2018   CREATININE 0.80 02/03/2018   BILITOT 0.6 01/08/2016   ALKPHOS 71  01/08/2016   AST 32 01/08/2016   ALT 22 01/08/2016   PROT 7.0 01/08/2016   ALBUMIN 3.7 01/08/2016   CALCIUM 8.8 (L) 02/01/2018   GFRAA >60 02/01/2018   QFTBGOLD Negative 11/28/2014   July 04, 2020 AVISE lupus index -2.7, ANA negative, ENA negative, CB CAP negative, Jo 1 -, antihistone negative, anticardiolipin negative, beta-2 GP 1 -, antiphosphatidylserine negative, RF negative, anti-CCP negative, anticarP negative antithyroglobulin negative, anti-TPO negative   Speciality Comments: Inadequate response to Plaquenil, methotrexate, Humira, Simponi Remicade and Xeljanz caused frequent infections Orencia IV March 2017 had a stopped in 2018 after parotid cancer  Procedures:  No procedures performed Allergies: Epinephrine   Assessment / Plan:     Visit Diagnoses: Rheumatoid arthritis with rheumatoid factor of multiple sites without organ or systems involvement ([place]) - In remission.  She has been off Biologics since 2018 after the diagnosis of parotid cancer.  She had no synovitis on my examination.  No warmth swelling or effusion was noted.  All autoimmune labs including ANA, rheumatoid factor and anti-CCP were negative.  Lab findings were discussed with the patient at length.  I have advised her to contact me in case she develops any increased joint swelling.  No synovitis was noted.  Give her a handout on  Sicca complex ([place]) - History of lacrimal duct plugging and burning mouth syndrome.  Over-the-counter products were discussed.  High risk medication use - Inadequate response-Plaquenil, methotrexate, Humira, Simponi. Remicade and Morrie Sheldon- frequent infections, Orencia IV 03/17-2018 stopped after parotid cancer  Chronic right shoulder pain - S/p cortisone injection by Dr. Erlinda Hong without relief.  MRI of the shoulder and cervical spine are pending.  Primary osteoarthritis of both hands - Clinical findings and radiographic findings were consistent with severe osteoarthritis.  No synovitis was  noted.  A handout on muscle strengthening exercises was given.  Phonic pain of both knees-she had no warmth swelling or effusion.  Have given her a handout on lower extremity muscle strengthening exercises.  DDD (degenerative disc disease), cervical-she has limited range of motion of her cervical spine with some stiffness.  MRI of the cervical spine is pending  DDD (degenerative disc disease), lumbar raynauds phenomenon-she has chronic lower back pain.  History of gout - She is  on allopurinol by Dr. Moreen Fowler.  History of TMJ syndrome  Fibromyalgia - History of generalized pain, positive tender points.  She has been going to integrative therapies which has been helpful.  Need for stretching and exercises was emphasized.  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  History of thyroid cancer - 2006  History of hypertension  Dyslipidemia  History of diabetes mellitus  History of asthma  History of gastroesophageal reflux (GERD)  Other sleep apnea  History of hearing loss  History of vitamin D deficiency  Vulvodynia  Orders: No orders of the defined types were placed in this encounter.  No orders of the defined types were placed in this encounter.    Follow-Up Instructions: Return if symptoms worsen or fail to improve.   Bo Merino, MD  Note - This record has been created using Editor, commissioning.  Chart creation errors have been sought, but may not always  have been located. Such creation errors do not reflect on  the standard of medical care.

## 2020-07-25 ENCOUNTER — Other Ambulatory Visit: Payer: Self-pay

## 2020-07-25 ENCOUNTER — Encounter: Payer: Self-pay | Admitting: Rheumatology

## 2020-07-25 ENCOUNTER — Ambulatory Visit: Payer: 59 | Admitting: Rheumatology

## 2020-07-25 VITALS — BP 158/92 | HR 81 | Resp 15 | Ht 59.0 in | Wt 208.0 lb

## 2020-07-25 DIAGNOSIS — M25562 Pain in left knee: Secondary | ICD-10-CM

## 2020-07-25 DIAGNOSIS — G8929 Other chronic pain: Secondary | ICD-10-CM

## 2020-07-25 DIAGNOSIS — M19041 Primary osteoarthritis, right hand: Secondary | ICD-10-CM

## 2020-07-25 DIAGNOSIS — Z8639 Personal history of other endocrine, nutritional and metabolic disease: Secondary | ICD-10-CM

## 2020-07-25 DIAGNOSIS — N94819 Vulvodynia, unspecified: Secondary | ICD-10-CM

## 2020-07-25 DIAGNOSIS — M5136 Other intervertebral disc degeneration, lumbar region: Secondary | ICD-10-CM

## 2020-07-25 DIAGNOSIS — M25511 Pain in right shoulder: Secondary | ICD-10-CM | POA: Diagnosis not present

## 2020-07-25 DIAGNOSIS — Z79899 Other long term (current) drug therapy: Secondary | ICD-10-CM

## 2020-07-25 DIAGNOSIS — Z8679 Personal history of other diseases of the circulatory system: Secondary | ICD-10-CM

## 2020-07-25 DIAGNOSIS — Z8739 Personal history of other diseases of the musculoskeletal system and connective tissue: Secondary | ICD-10-CM

## 2020-07-25 DIAGNOSIS — E785 Hyperlipidemia, unspecified: Secondary | ICD-10-CM

## 2020-07-25 DIAGNOSIS — Z85850 Personal history of malignant neoplasm of thyroid: Secondary | ICD-10-CM

## 2020-07-25 DIAGNOSIS — Z8709 Personal history of other diseases of the respiratory system: Secondary | ICD-10-CM

## 2020-07-25 DIAGNOSIS — M51369 Other intervertebral disc degeneration, lumbar region without mention of lumbar back pain or lower extremity pain: Secondary | ICD-10-CM

## 2020-07-25 DIAGNOSIS — M5030 Other cervical disc degeneration, unspecified cervical region: Secondary | ICD-10-CM

## 2020-07-25 DIAGNOSIS — M3500 Sicca syndrome, unspecified: Secondary | ICD-10-CM | POA: Diagnosis not present

## 2020-07-25 DIAGNOSIS — M797 Fibromyalgia: Secondary | ICD-10-CM

## 2020-07-25 DIAGNOSIS — G4739 Other sleep apnea: Secondary | ICD-10-CM

## 2020-07-25 DIAGNOSIS — M0579 Rheumatoid arthritis with rheumatoid factor of multiple sites without organ or systems involvement: Secondary | ICD-10-CM

## 2020-07-25 DIAGNOSIS — Z8669 Personal history of other diseases of the nervous system and sense organs: Secondary | ICD-10-CM

## 2020-07-25 DIAGNOSIS — Z8719 Personal history of other diseases of the digestive system: Secondary | ICD-10-CM

## 2020-07-25 DIAGNOSIS — M25561 Pain in right knee: Secondary | ICD-10-CM

## 2020-07-25 DIAGNOSIS — C07 Malignant neoplasm of parotid gland: Secondary | ICD-10-CM

## 2020-07-25 DIAGNOSIS — M19042 Primary osteoarthritis, left hand: Secondary | ICD-10-CM

## 2020-07-25 NOTE — Patient Instructions (Signed)
Journal for Nurse Practitioners, 15(4), 263-267. Retrieved February 01, 2018 from [email or web address].[email or web address]">  Knee Exercises Ask your health care provider which exercises are safe for you. Do exercises exactly as told by your health care provider and adjust them as directed. It is normal to feel mild stretching, pulling, tightness, or discomfort as you do these exercises. Stop right away if you feel sudden pain or your pain gets worse. Do not begin these exercises until told by your health care provider. Stretching and range-of-motion exercises These exercises warm up your muscles and joints and improve the movement and flexibility of your knee. These exercises also help to relieve pain and swelling. Knee extension, prone 1. Lie on your abdomen (prone position) on a bed. 2. Place your left / right knee just beyond the edge of the surface so your knee is not on the bed. You can put a towel under your left / right thigh just above your kneecap for comfort. 3. Relax your leg muscles and allow gravity to straighten your knee (extension). You should feel a stretch behind your left / right knee. 4. Hold this position for __________ seconds. 5. Scoot up so your knee is supported between repetitions. Repeat __________ times. Complete this exercise __________ times a day. Knee flexion, active 1. Lie on your back with both legs straight. If this causes back discomfort, bend your left / right knee so your foot is flat on the floor. 2. Slowly slide your left / right heel back toward your buttocks. Stop when you feel a gentle stretch in the front of your knee or thigh (flexion). 3. Hold this position for __________ seconds. 4. Slowly slide your left / right heel back to the starting position. Repeat __________ times. Complete this exercise __________ times a day.   Quadriceps stretch, prone 1. Lie on your abdomen on a firm surface, such as a bed or padded floor. 2. Bend your left / right knee and hold  your ankle. If you cannot reach your ankle or pant leg, loop a belt around your foot and grab the belt instead. 3. Gently pull your heel toward your buttocks. Your knee should not slide out to the side. You should feel a stretch in the front of your thigh and knee (quadriceps). 4. Hold this position for __________ seconds. Repeat __________ times. Complete this exercise __________ times a day.   Hamstring, supine 1. Lie on your back (supine position). 2. Loop a belt or towel over the ball of your left / right foot. The ball of your foot is on the walking surface, right under your toes. 3. Straighten your left / right knee and slowly pull on the belt to raise your leg until you feel a gentle stretch behind your knee (hamstring). ? Do not let your knee bend while you do this. ? Keep your other leg flat on the floor. 4. Hold this position for __________ seconds. Repeat __________ times. Complete this exercise __________ times a day. Strengthening exercises These exercises build strength and endurance in your knee. Endurance is the ability to use your muscles for a long time, even after they get tired. Quadriceps, isometric This exercise stretches the muscles in front of your thigh (quadriceps) without moving your knee joint (isometric). 1. Lie on your back with your left / right leg extended and your other knee bent. Put a rolled towel or small pillow under your knee if told by your health care provider. 2. Slowly tense the muscles in the front of your   left / right thigh. You should see your kneecap slide up toward your hip or see increased dimpling just above the knee. This motion will push the back of the knee toward the floor. 3. For __________ seconds, hold the muscle as tight as you can without increasing your pain. 4. Relax the muscles slowly and completely. Repeat __________ times. Complete this exercise __________ times a day.   Straight leg raises This exercise stretches the muscles in  front of your thigh (quadriceps) and the muscles that move your hips (hip flexors). 1. Lie on your back with your left / right leg extended and your other knee bent. 2. Tense the muscles in the front of your left / right thigh. You should see your kneecap slide up or see increased dimpling just above the knee. Your thigh may even shake a bit. 3. Keep these muscles tight as you raise your leg 4-6 inches (10-15 cm) off the floor. Do not let your knee bend. 4. Hold this position for __________ seconds. 5. Keep these muscles tense as you lower your leg. 6. Relax your muscles slowly and completely after each repetition. Repeat __________ times. Complete this exercise __________ times a day. Hamstring, isometric 1. Lie on your back on a firm surface. 2. Bend your left / right knee about __________ degrees. 3. Dig your left / right heel into the surface as if you are trying to pull it toward your buttocks. Tighten the muscles in the back of your thighs (hamstring) to "dig" as hard as you can without increasing any pain. 4. Hold this position for __________ seconds. 5. Release the tension gradually and allow your muscles to relax completely for __________ seconds after each repetition. Repeat __________ times. Complete this exercise __________ times a day. Hamstring curls If told by your health care provider, do this exercise while wearing ankle weights. Begin with __________ lb weights. Then increase the weight by 1 lb (0.5 kg) increments. Do not wear ankle weights that are more than __________ lb. 1. Lie on your abdomen with your legs straight. 2. Bend your left / right knee as far as you can without feeling pain. Keep your hips flat against the floor. 3. Hold this position for __________ seconds. 4. Slowly lower your leg to the starting position. Repeat __________ times. Complete this exercise __________ times a day.   Squats This exercise strengthens the muscles in front of your thigh and knee  (quadriceps). 1. Stand in front of a table, with your feet and knees pointing straight ahead. You may rest your hands on the table for balance but not for support. 2. Slowly bend your knees and lower your hips like you are going to sit in a chair. ? Keep your weight over your heels, not over your toes. ? Keep your lower legs upright so they are parallel with the table legs. ? Do not let your hips go lower than your knees. ? Do not bend lower than told by your health care provider. ? If your knee pain increases, do not bend as low. 3. Hold the squat position for __________ seconds. 4. Slowly push with your legs to return to standing. Do not use your hands to pull yourself to standing. Repeat __________ times. Complete this exercise __________ times a day. Wall slides This exercise strengthens the muscles in front of your thigh and knee (quadriceps). 1. Lean your back against a smooth wall or door, and walk your feet out 18-24 inches (46-61 cm) from it. 2.   Place your feet hip-width apart. 3. Slowly slide down the wall or door until your knees bend __________ degrees. Keep your knees over your heels, not over your toes. Keep your knees in line with your hips. 4. Hold this position for __________ seconds. Repeat __________ times. Complete this exercise __________ times a day.   Straight leg raises This exercise strengthens the muscles that rotate the leg at the hip and move it away from your body (hip abductors). 1. Lie on your side with your left / right leg in the top position. Lie so your head, shoulder, knee, and hip line up. You may bend your bottom knee to help you keep your balance. 2. Roll your hips slightly forward so your hips are stacked directly over each other and your left / right knee is facing forward. 3. Leading with your heel, lift your top leg 4-6 inches (10-15 cm). You should feel the muscles in your outer hip lifting. ? Do not let your foot drift forward. ? Do not let your  knee roll toward the ceiling. 4. Hold this position for __________ seconds. 5. Slowly return your leg to the starting position. 6. Let your muscles relax completely after each repetition. Repeat __________ times. Complete this exercise __________ times a day.   Straight leg raises This exercise stretches the muscles that move your hips away from the front of the pelvis (hip extensors). 1. Lie on your abdomen on a firm surface. You can put a pillow under your hips if that is more comfortable. 2. Tense the muscles in your buttocks and lift your left / right leg about 4-6 inches (10-15 cm). Keep your knee straight as you lift your leg. 3. Hold this position for __________ seconds. 4. Slowly lower your leg to the starting position. 5. Let your leg relax completely after each repetition. Repeat __________ times. Complete this exercise __________ times a day. This information is not intended to replace advice given to you by your health care provider. Make sure you discuss any questions you have with your health care provider. Document Revised: 02/02/2018 Document Reviewed: 02/02/2018 Elsevier Patient Education  2021 [place]. Iliotibial Band Syndrome Rehab Ask your health care provider which exercises are safe for you. Do exercises exactly as told by your health care provider and adjust them as directed. It is normal to feel mild stretching, pulling, tightness, or discomfort as you do these exercises. Stop right away if you feel sudden pain or your pain gets significantly worse. Do not begin these exercises until told by your health care provider. Stretching and range-of-motion exercises These exercises warm up your muscles and joints and improve the movement and flexibility of your hip and pelvis. Quadriceps stretch, prone 6. Lie on your abdomen (prone position) on a firm surface, such as a bed or padded floor. 7. Bend your left / right knee and reach back to hold your ankle or pant leg. If  you cannot reach your ankle or pant leg, loop a belt around your foot and grab the belt instead. 8. Gently pull your heel toward your buttocks. Your knee should not slide out to the side. You should feel a stretch in the front of your thigh and knee (quadriceps). 9. Hold this position for __________ seconds. Repeat __________ times. Complete this exercise __________ times a day.   Iliotibial band stretch An iliotibial band is a strong band of muscle tissue that runs from the outer side of your hip to the outer side of your  thigh and knee. 5. Lie on your side with your left / right leg in the top position. 6. Bend both of your knees and grab your left / right ankle. Stretch out your bottom arm to help you balance. 7. Slowly bring your top knee back so your thigh goes behind your trunk. 8. Slowly lower your top leg toward the floor until you feel a gentle stretch on the outside of your left / right hip and thigh. If you do not feel a stretch and your knee will not fall farther, place the heel of your other foot on top of your knee and pull your knee down toward the floor with your foot. 9. Hold this position for __________ seconds. Repeat __________ times. Complete this exercise __________ times a day.   Strengthening exercises These exercises build strength and endurance in your hip and pelvis. Endurance is the ability to use your muscles for a long time, even after they get tired. Straight leg raises, side-lying This exercise strengthens the muscles that rotate the leg at the hip and move it away from your body (hip abductors). 5. Lie on your side with your left / right leg in the top position. Lie so your head, shoulder, hip, and knee line up. You may bend your bottom knee to help you balance. 6. Roll your hips slightly forward so your hips are stacked directly over each other and your left / right knee is facing forward. 7. Tense the muscles in your outer thigh and lift your top leg 4-6 inches  (10-15 cm). 8. Hold this position for __________ seconds. 9. Slowly lower your leg to return to the starting position. Let your muscles relax completely before doing another repetition. Repeat __________ times. Complete this exercise __________ times a day.   Leg raises, prone This exercise strengthens the muscles that move the hips backward (hip extensors). 5. Lie on your abdomen (prone position) on your bed or a firm surface. You can put a pillow under your hips if that is more comfortable for your lower back. 6. Bend your left / right knee so your foot is straight up in the air. 7. Squeeze your buttocks muscles and lift your left / right thigh off the bed. Do not let your back arch. 8. Tense your thigh muscle as hard as you can without increasing any knee pain. 9. Hold this position for __________ seconds. 10. Slowly lower your leg to return to the starting position and allow it to relax completely. Repeat __________ times. Complete this exercise __________ times a day. Hip hike 5. Stand sideways on a bottom step. Stand on your left / right leg with your other foot unsupported next to the step. You can hold on to a railing or wall for balance if needed. 6. Keep your knees straight and your torso square. Then lift your left / right hip up toward the ceiling. 7. Slowly let your left / right hip lower toward the floor, past the starting position. Your foot should get closer to the floor. Do not lean or bend your knees. Repeat __________ times. Complete this exercise __________ times a day. This information is not intended to replace advice given to you by your health care provider. Make sure you discuss any questions you have with your health care provider. Document Revised: 06/22/2019 Document Reviewed: 06/22/2019 Elsevier Patient Education  2021 [address] [employer] [address]. Hand Exercises Hand exercises can be helpful for almost anyone. These exercises can strengthen the hands, improve flexibility and  movement, and increase blood flow to the hands. These results can make work and daily tasks easier. Hand exercises can be especially helpful for people who have joint pain from arthritis or have nerve damage from overuse (carpal tunnel syndrome). These exercises can also help people who have injured a hand. Exercises Most of these hand exercises are gentle stretching and motion exercises. It is usually safe to do them often throughout the day. Warming up your hands before exercise may help to reduce stiffness. You can do this with gentle massage or by placing your hands in warm water for 10-15 minutes. It is normal to feel some stretching, pulling, tightness, or mild discomfort as you begin new exercises. This will gradually improve. Stop an exercise right away if you feel sudden, severe pain or your pain gets worse. Ask your health care provider which exercises are best for you. Knuckle bend or "claw" fist 1. Stand or sit with your arm, hand, and all five fingers pointed straight up. Make sure to keep your wrist straight during the exercise. 2. Gently bend your fingers down toward your palm until the tips of your fingers are touching the top of your palm. Keep your big knuckle straight and just bend the small knuckles in your fingers. 3. Hold this position for __________ seconds. 4. Straighten (extend) your fingers back to the starting position. Repeat this exercise 5-10 times with each hand. Full finger fist 1. Stand or sit with your arm, hand, and all five fingers pointed straight up. Make sure to keep your wrist straight during the exercise. 2. Gently bend your fingers into your palm until the tips of your fingers are touching the middle of your palm. 3. Hold this position for __________ seconds. 4. Extend your fingers back to the starting position, stretching every joint fully. Repeat this exercise 5-10 times with each hand. Straight fist 1. Stand or sit with your arm, hand, and all five  fingers pointed straight up. Make sure to keep your wrist straight during the exercise. 2. Gently bend your fingers at the big knuckle, where your fingers meet your hand, and the middle knuckle. Keep the knuckle at the tips of your fingers straight and try to touch the bottom of your palm. 3. Hold this position for __________ seconds. 4. Extend your fingers back to the starting position, stretching every joint fully. Repeat this exercise 5-10 times with each hand. Tabletop 1. Stand or sit with your arm, hand, and all five fingers pointed straight up. Make sure to keep your wrist straight during the exercise. 2. Gently bend your fingers at the big knuckle, where your fingers meet your hand, as far down as you can while keeping the small knuckles in your fingers straight. Think of forming a tabletop with your fingers. 3. Hold this position for __________ seconds. 4. Extend your fingers back to the starting position, stretching every joint fully. Repeat this exercise 5-10 times with each hand. Finger spread 1. Place your hand flat on a table with your palm facing down. Make sure your wrist stays straight as you do this exercise. 2. Spread your fingers and thumb apart from each other as far as you can until you feel a gentle stretch. Hold this position for __________ seconds. 3. Bring your fingers and thumb tight together again. Hold this position for __________ seconds. Repeat this exercise 5-10 times with each hand. Making circles 1. Stand or sit with your arm, hand, and all five fingers pointed straight up. Make sure  to keep your wrist straight during the exercise. 2. Make a circle by touching the tip of your thumb to the tip of your index finger. 3. Hold for __________ seconds. Then open your hand wide. 4. Repeat this motion with your thumb and each finger on your hand. Repeat this exercise 5-10 times with each hand. Thumb motion 1. Sit with your forearm resting on a table and your wrist  straight. Your thumb should be facing up toward the ceiling. Keep your fingers relaxed as you move your thumb. 2. Lift your thumb up as high as you can toward the ceiling. Hold for __________ seconds. 3. Bend your thumb across your palm as far as you can, reaching the tip of your thumb for the small finger (pinkie) side of your palm. Hold for __________ seconds. Repeat this exercise 5-10 times with each hand. Grip strengthening 1. Hold a stress ball or other soft ball in the middle of your hand. 2. Slowly increase the pressure, squeezing the ball as much as you can without causing pain. Think of bringing the tips of your fingers into the middle of your palm. All of your finger joints should bend when doing this exercise. 3. Hold your squeeze for __________ seconds, then relax. Repeat this exercise 5-10 times with each hand.   Contact a health care provider if:  Your hand pain or discomfort gets much worse when you do an exercise.  Your hand pain or discomfort does not improve within 2 hours after you exercise. If you have any of these problems, stop doing these exercises right away. Do not do them again unless your health care provider says that you can. Get help right away if:  You develop sudden, severe hand pain or swelling. If this happens, stop doing these exercises right away. Do not do them again unless your health care provider says that you can. This information is not intended to replace advice given to you by your health care provider. Make sure you discuss any questions you have with your health care provider. Document Revised: 08/05/2018 Document Reviewed: 04/15/2018 Elsevier Patient Education  2021 [employer].

## 2020-07-26 ENCOUNTER — Telehealth: Payer: Self-pay

## 2020-07-26 ENCOUNTER — Ambulatory Visit: Payer: 59 | Admitting: Rheumatology

## 2020-07-26 NOTE — Telephone Encounter (Signed)
MRI cancelled, this was approved

## 2020-07-26 NOTE — Telephone Encounter (Signed)
FYI

## 2020-07-26 NOTE — Telephone Encounter (Signed)
Patient called she wants to cancel her mri she sated she is going to [facility] and getting acupuncture she stated the therapy is helping so she doesn't need the mri call [phone]

## 2020-07-26 NOTE — Telephone Encounter (Signed)
Ok thank you 

## 2020-07-30 ENCOUNTER — Telehealth (HOSPITAL_BASED_OUTPATIENT_CLINIC_OR_DEPARTMENT_OTHER): Payer: Self-pay | Admitting: Family Medicine

## 2020-07-30 ENCOUNTER — Ambulatory Visit (HOSPITAL_BASED_OUTPATIENT_CLINIC_OR_DEPARTMENT_OTHER): Payer: 59 | Admitting: Specialist

## 2020-07-30 NOTE — Telephone Encounter (Signed)
This encounter was opened in error.  Please disregard.

## 2020-07-31 ENCOUNTER — Ambulatory Visit (HOSPITAL_BASED_OUTPATIENT_CLINIC_OR_DEPARTMENT_OTHER): Payer: 59 | Admitting: Family Medicine

## 2020-07-31 ENCOUNTER — Other Ambulatory Visit (HOSPITAL_BASED_OUTPATIENT_CLINIC_OR_DEPARTMENT_OTHER): Payer: Self-pay | Admitting: Family Medicine

## 2020-07-31 ENCOUNTER — Other Ambulatory Visit: Payer: 59

## 2020-07-31 DIAGNOSIS — F1120 Opioid dependence, uncomplicated: Secondary | ICD-10-CM

## 2020-07-31 MED ORDER — BUPRENORPHINE HCL-NALOXONE HCL 8-2 MG SL FILM
ORAL_FILM | SUBLINGUAL | 0 refills | Status: DC
Start: 2020-07-31 — End: 2020-08-14

## 2020-07-31 NOTE — Telephone Encounter (Signed)
Slept through group but connected with [name].  Will fill suboxone.

## 2020-08-02 ENCOUNTER — Ambulatory Visit (HOSPITAL_BASED_OUTPATIENT_CLINIC_OR_DEPARTMENT_OTHER): Payer: 59 | Admitting: Physician Assistant

## 2020-08-14 ENCOUNTER — Ambulatory Visit: Payer: 59 | Attending: Family Medicine | Admitting: Family Medicine

## 2020-08-14 ENCOUNTER — Ambulatory Visit: Payer: 59 | Attending: Psychosomatic Medicine | Admitting: Clinical

## 2020-08-14 ENCOUNTER — Other Ambulatory Visit: Payer: Self-pay

## 2020-08-14 DIAGNOSIS — F1120 Opioid dependence, uncomplicated: Secondary | ICD-10-CM | POA: Insufficient documentation

## 2020-08-14 MED ORDER — BUPRENORPHINE HCL-NALOXONE HCL 8-2 MG SL FILM
ORAL_FILM | SUBLINGUAL | 0 refills | Status: DC
Start: 2020-08-14 — End: 2020-08-28

## 2020-08-14 NOTE — Progress Notes (Signed)
Cheryl Dixon is a 65 year old female seen in follow up for opioid dependence:    Buprenorphine dose: 20mg   Response, adequacy of dose: good  Relapses/close calls: none  Trigger: denies  Meetings: none currently    Social history/events:  Struggling with continued challenges with nephew's death.  Did have a good time with Easter with her grandchildren.    Present Medications:  buprenorphine-naloxone (SUBOXONE) 8-2 MG sublingual film, Take two and one-half films under the tongue daily., Disp: 35 Film, Rfl: 0  estradiol (ESTRACE VAGINAL) 0.1 MG/GM vaginal cream, Place 4 g vaginally 3 (three) times a week, Disp: 127.5 g, Rfl: 1  polyethylene glycol (HEALTHYLAX) 17 g packet, TAKE 17G BY MOUTH DAILY MIX IN 64 OUNCE OF CLEAR LIQUID AND TAKE as directed, Disp: 28 packet, Rfl: 2  naproxen (NAPROSYN) 500 MG tablet, TAKE 1 TABLET BY MOUTH WITH FOOD EVERY TWELVE HOURS AS NEEDED FOR PAIN, Disp: 20 tablet, Rfl: 0  cholecalciferol (HM VITAMIN D3) 1000 UNIT tablet, Take 1,000 Units by mouth daily, Disp: , Rfl:   acetaminophen (TYLENOL) 500 MG tablet, Take 500 mg by mouth every 6 (six) hours as needed for Pain, Disp: , Rfl:   umeclidinium (INCRUSE ELLIPTA) 62.5 MCG/INH inhaler, INHALE 1 PUFF BY MOUTH INTO LUNGS DAILY, Disp: 1 each, Rfl: 11  buPROPion (WELLBUTRIN SR) 200 MG 12 hr tablet, Take 1 tablet by mouth 2 (two) times daily, Disp: 180 tablet, Rfl: 3  doxepin (SINEQUAN) 10 MG capsule, Take 1-2 capsules by mouth daily  for 14 days, Disp: 28 capsule, Rfl: 0  albuterol (VENTOLIN HFA) 108 (90 Base) MCG/ACT inhaler, INHALE 2 PUFFS INTO THE LUNGS EVERY 6 HOURS AS NEEDED FOR WHEEZING OR SHORTNESS OF BREATH, Disp: 8.5 g, Rfl: 11  ipratropium-albuterol (DUO-NEB) 0.5-2.5 (3) MG/3ML SOLN Inhalation Solution, Take 3 mLs by nebulization 4 (four) times daily., Disp: 180 mL, Rfl: 0    No current facility-administered medications on file prior to visit.      PHQ-9 TOTAL SCORE 01/12/2019 06/01/2018 03/03/2017   Doc FlowSheet Total Score - - -   Doc  FlowSheet Total Score 10 6 4            PHYSICAL EXAMINATION via video:  General appearance - healthy female in no distress  Neuro - nonfocal  Affect - normal  Behavior without evidence of intoxication or impairment    PMP Reviewed.  No unauthorized prescriptions since last refill.     Assessment:  Opioid Dependence  Comment: Coping with nephew's death, feels like there are lots of unanswered questions and she hasn't processed it fully. Stable in recovery.   Plan:  Continue buprenorphine, reviewed criteria for tapering when patient is ready. Discussed sources of support.  Patient is counseled regarding relapse prevention, involvement in recovery groups, potential side effects of buprenorphine, and eventual taper of medication.    For the patient participating in the group via telephone and/or video technology: the patient/guardian has verbally consented to participate in this group therapy session by televisit. The patient/guardian was encouraged to be in a private location due to personal health information being discussed and where they could not be overheard by individuals not participating in the group therapy. Although all [facility] staff/providers participating in this group therapy televisit are in a private location, all the risks of telephone and/or video technology used to conduct this group therapy session cannot be controlled by [facility], (i.e. interruptions, unauthorized access/recording and technical difficulties).  Each patient understands that the visit can be stopped at any  time for any reason, including if the conferencing connections are inadequate.       Geni Bers, MD, 08/14/2020

## 2020-08-19 NOTE — Progress Notes (Signed)
OBOT (OFFICE BASED OPIOID TREATMENT)  GROUP PROGRESS NOTE     Televisit consent, as applicable:  For the patient participating in the group via telephone and/or video technology: The patient/guardian has verbally consented to participate in this group therapy session by televisit. The patient/guardian was encouraged to be in a private location due to personal health information being discussed and where they could not be overheard by individuals not participating in the group therapy. Although all [place] staff/providers participating in this group therapy televisit are in a private location, all the risks of telephone and/or video technology used to conduct this group therapy session cannot be controlled by [place], (i.e. interruptions, unauthorized access/recording and technical difficulties).  Each patient understands that the visit can be stopped at any time for any reason, including if the conferencing connections are inadequate.     GROUP NAME:  Office Based Opioid Treatment Group     LEADER(S): Hampton Abbot, MD, Cherlynn Perches, Ph.D, [name], RN      Cheryl Dixon is a 65 year old, Divorced, female, who presented for [facility] group visit.   Length of group: 60 min which included 30 min of Health Behavior Intervention and OUD Psychoeducation.     Number of participants in group today: 7  Group Topic Today: The Role of Relationships in Recovery Process     Purpose of Group:    Education about Opioid Use Disorder (OUD) and treatment   Enhancing adjustment and coping with OUD   Improving management of OUD and treatment adherence   Supporting Recovery Process   Increasing health promoting behaviors   Decreasing health risk behaviors and risks of relapse   Addressing psychological/psychosocial/behavioral barriers to preventing addiction and relapse   Promotion of functional improvement     Group Interventions Utilized:   Psychoeducation related to the psychological, behavioral, and/ or psychosocial aspects of  OUD   Motivational interviewing   Coping skills training   Support and relationships strategies discussed   Emotional awareness and management   Mindfulness techniques training    Group Process:        Group rules    Review of group goals/structure   The role of relationships in OUD management discussed. Discussed strategies to develop supportive relationships in order to progress in recovery.    Explored relapse prevention strategies    Mindfulness and self-compassion strategies discussed.   Discussed support and helpful strategies to promote recovery   Check in      Individual Patient Participation: Pt connected via video technology. Pt reported the death in the family - her nephew who was 24 passed away in a sober house. Pt is sad and appears going through active stages of grief. Group members were empathetic and expressed support to pt. No relapses/close calls reported.     Diagnosis (addressed by this group): Opioid Dependence on Agonist Therapy (Opioid Use Disorder on Agonist Therapy)      Relevant changes in mental status: No major changes reported or observed       Plan: Pt will continue OBOT treatment     Cherlynn Perches, Ph.D.

## 2020-08-28 ENCOUNTER — Ambulatory Visit (HOSPITAL_BASED_OUTPATIENT_CLINIC_OR_DEPARTMENT_OTHER): Payer: 59 | Admitting: Family Medicine

## 2020-08-28 ENCOUNTER — Other Ambulatory Visit (HOSPITAL_BASED_OUTPATIENT_CLINIC_OR_DEPARTMENT_OTHER): Payer: Self-pay | Admitting: Internal Medicine

## 2020-08-28 DIAGNOSIS — F1120 Opioid dependence, uncomplicated: Secondary | ICD-10-CM

## 2020-08-28 MED ORDER — POLYETHYLENE GLYCOL 3350 17 G PO PACK
PACK | ORAL | 2 refills | Status: DC
Start: 2020-08-28 — End: 2021-01-04

## 2020-08-28 MED ORDER — BUPRENORPHINE HCL-NALOXONE HCL 8-2 MG SL FILM
ORAL_FILM | SUBLINGUAL | 0 refills | Status: DC
Start: 2020-08-28 — End: 2020-09-11

## 2020-08-28 NOTE — Telephone Encounter (Signed)
PER Patient (self), Cheryl Dixon is a 65 year old female has requested a refill of healthylax and suboxone     Last prescribed - start date: [id number] end date: [id number]    Last Office Visit: [id number]  Last Physical Exam: [id number]      Other Med Adult:  Most Recent BP Reading(s)  03/19/20 : 132/86        Cholesterol (mg/dl)   Date Value   01/04/2010 264 (H)     LOW DENSITY LIPOPROTEIN DIRECT (mg/dl)   Date Value   01/04/2010 101 (H)     HIGH DENSITY LIPOPROTEIN (mg/dl)   Date Value   01/04/2010 38     No results found for: TG      THYROID SCREEN TSH REFLEX FT4 (uIU/mL)   Date Value   01/12/2019 1.630         No results found for: TSH    HEMOGLOBIN A1C (%)   Date Value   01/12/2019 4.6       No results found for: POCA1C      INR (no units)   Date Value   02/16/2007 1.0 (L)   07/03/2006 < 1.0 (L)       SODIUM (mmol/L)   Date Value   03/01/2020 144       POTASSIUM (mmol/L)   Date Value   03/01/2020 4.3           CREATININE (mg/dL)   Date Value   03/01/2020 1.1       Documented patient preferred pharmacies:    [id number] - [place], [place] - [place]  Phone: [phone] Fax: [phone]

## 2020-09-11 ENCOUNTER — Ambulatory Visit: Payer: 59 | Attending: Family Medicine | Admitting: Family Medicine

## 2020-09-11 ENCOUNTER — Ambulatory Visit: Payer: 59 | Attending: Psychosomatic Medicine | Admitting: Clinical

## 2020-09-11 ENCOUNTER — Other Ambulatory Visit (HOSPITAL_BASED_OUTPATIENT_CLINIC_OR_DEPARTMENT_OTHER): Payer: Self-pay | Admitting: Internal Medicine

## 2020-09-11 DIAGNOSIS — F1120 Opioid dependence, uncomplicated: Secondary | ICD-10-CM | POA: Insufficient documentation

## 2020-09-11 MED ORDER — NICOTINE 21 MG/24HR TD PT24
MEDICATED_PATCH | TRANSDERMAL | 1 refills | Status: DC
Start: 2020-09-11 — End: 2021-01-04

## 2020-09-11 MED ORDER — BUPRENORPHINE HCL-NALOXONE HCL 8-2 MG SL FILM
ORAL_FILM | SUBLINGUAL | 0 refills | Status: DC
Start: 2020-09-11 — End: 2020-09-25

## 2020-09-11 NOTE — Telephone Encounter (Signed)
PER Pharmacy, [name] is a 65 year old female has requested a refill of nicotine patches .      Last Office Visit: 08/14/20 with elana bloomfield   Last Physical Exam: 05/27/2013      Other Med Adult:  Most Recent BP Reading(s)  03/19/20 : 132/86        Cholesterol (mg/dl)   Date Value   01/04/2010 264 (H)     LOW DENSITY LIPOPROTEIN DIRECT (mg/dl)   Date Value   01/04/2010 101 (H)     HIGH DENSITY LIPOPROTEIN (mg/dl)   Date Value   01/04/2010 38     No results found for: TG      THYROID SCREEN TSH REFLEX FT4 (uIU/mL)   Date Value   01/12/2019 1.630         No results found for: TSH    HEMOGLOBIN A1C (%)   Date Value   01/12/2019 4.6       No results found for: POCA1C      INR (no units)   Date Value   02/16/2007 1.0 (L)   07/03/2006 < 1.0 (L)       SODIUM (mmol/L)   Date Value   03/01/2020 144       POTASSIUM (mmol/L)   Date Value   03/01/2020 4.3           CREATININE (mg/dL)   Date Value   03/01/2020 1.1       Documented patient preferred pharmacies:    [id number] - [place], [place] - [place]  Phone: [phone] Fax: [phone]

## 2020-09-11 NOTE — Progress Notes (Signed)
Cheryl Dixon is a 65 year old female seen in follow up for opioid dependence:    Buprenorphine dose: 20mg   Response, adequacy of dose: good  Relapses/close calls: none  Trigger: denies  Meetings: none currently    Social history/events:  Feeling better.  Coping with nephew's death--still hard but feeling more at peace about it.  Happy about being outside and weather change.    Much more physically active and setting goals that she is achieving.    Present Medications:  polyethylene glycol (HEALTHYLAX) 17 g packet, TAKE 17G BY MOUTH DAILY MIX IN 64 OUNCE OF CLEAR LIQUID AND TAKE as directed, Disp: 28 packet, Rfl: 2  buprenorphine-naloxone (SUBOXONE) 8-2 MG sublingual film, Take two and one-half films under the tongue daily., Disp: 35 Film, Rfl: 0  estradiol (ESTRACE VAGINAL) 0.1 MG/GM vaginal cream, Place 4 g vaginally 3 (three) times a week, Disp: 127.5 g, Rfl: 1  naproxen (NAPROSYN) 500 MG tablet, TAKE 1 TABLET BY MOUTH WITH FOOD EVERY TWELVE HOURS AS NEEDED FOR PAIN, Disp: 20 tablet, Rfl: 0  cholecalciferol (HM VITAMIN D3) 1000 UNIT tablet, Take 1,000 Units by mouth daily, Disp: , Rfl:   acetaminophen (TYLENOL) 500 MG tablet, Take 500 mg by mouth every 6 (six) hours as needed for Pain, Disp: , Rfl:   umeclidinium (INCRUSE ELLIPTA) 62.5 MCG/INH inhaler, INHALE 1 PUFF BY MOUTH INTO LUNGS DAILY, Disp: 1 each, Rfl: 11  buPROPion (WELLBUTRIN SR) 200 MG 12 hr tablet, Take 1 tablet by mouth 2 (two) times daily, Disp: 180 tablet, Rfl: 3  doxepin (SINEQUAN) 10 MG capsule, Take 1-2 capsules by mouth daily  for 14 days, Disp: 28 capsule, Rfl: 0  albuterol (VENTOLIN HFA) 108 (90 Base) MCG/ACT inhaler, INHALE 2 PUFFS INTO THE LUNGS EVERY 6 HOURS AS NEEDED FOR WHEEZING OR SHORTNESS OF BREATH, Disp: 8.5 g, Rfl: 11  ipratropium-albuterol (DUO-NEB) 0.5-2.5 (3) MG/3ML SOLN Inhalation Solution, Take 3 mLs by nebulization 4 (four) times daily., Disp: 180 mL, Rfl: 0    No current facility-administered medications on file prior to  visit.      PHQ-9 TOTAL SCORE 01/12/2019 06/01/2018 03/03/2017   Doc FlowSheet Total Score - - -   Doc FlowSheet Total Score 10 6 4            PHYSICAL EXAMINATION via video:  General appearance - healthy female in no distress  Neuro - nonfocal  Affect - normal  Behavior without evidence of intoxication or impairment    PMP Reviewed.  No unauthorized prescriptions since last refill.     Assessment:  Opioid Dependence  Comment: Stable recovery, working on coping strategies that are working well for her.  Plan:  Continue buprenorphine, reviewed criteria for tapering when patient is ready. Discussed sources of support.  Patient is counseled regarding relapse prevention, involvement in recovery groups, potential side effects of buprenorphine, and eventual taper of medication.    For the patient participating in the group via telephone and/or video technology: the patient/guardian has verbally consented to participate in this group therapy session by televisit. The patient/guardian was encouraged to be in a private location due to personal health information being discussed and where they could not be overheard by individuals not participating in the group therapy. Although all [facility] staff/providers participating in this group therapy televisit are in a private location, all the risks of telephone and/or video technology used to conduct this group therapy session cannot be controlled by [place], (i.e. interruptions, unauthorized access/recording and technical difficulties).  Each patient  understands that the visit can be stopped at any time for any reason, including if the conferencing connections are inadequate.       Geni Bers, MD, 09/11/2020

## 2020-09-14 NOTE — Progress Notes (Signed)
OBOT (OFFICE BASED OPIOID TREATMENT)  GROUP PROGRESS NOTE     Televisit consent, as applicable:  For the patient participating in the group via telephone and/or video technology: The patient/guardian has verbally consented to participate in this group therapy session by televisit. The patient/guardian was encouraged to be in a private location due to personal health information being discussed and where they could not be overheard by individuals not participating in the group therapy. Although all [facility] staff/providers participating in this group therapy televisit are in a private location, all the risks of telephone and/or video technology used to conduct this group therapy session cannot be controlled by [place], (i.e. interruptions, unauthorized access/recording and technical difficulties).  Each patient understands that the visit can be stopped at any time for any reason, including if the conferencing connections are inadequate.     GROUP NAME:  Office Based Opioid Treatment Group     LEADER(S): Hampton Abbot, MD and Cherlynn Perches, Ph.D.     Cheryl Dixon is a 65 year old, Divorced, female, who presented for [facility] group visit.   Length of group: 60 min which included 30 min of Health Behavior Intervention and OUD Psychoeducation.    Number of participants in group today: 10    Group Topic Today: Values vs. Goals. Discussed values as personal qualities that guide patients' actions during recovery process and provide the general guidelines for behavior. Performed a practical exercise 'Bullseye' which helps patients redefine their goals, imagine what kind of person they would like to be, how they treat self and others, and patient's relationship with the world around them. The group members reviewed the model of Smart Goals and discussed goals as desired results or concepts of the future.       Purpose of Group:    Education about Opioid Use Disorder (OUD) and treatment   Enhancing adjustment and coping with  OUD   Improving management of OUD and treatment adherence   Supporting Recovery Process   Increasing health promoting behaviors   Decreasing health risk behaviors and risks of relapse   Addressing psychological/psychosocial/behavioral barriers to preventing addiction and relapse   Promotion of functional improvement     Group Interventions Utilized:   Psychoeducation related to the psychological, behavioral, and/ or psychosocial aspects of OUD   Motivational interviewing   Mindfulness-Oriented Recovery Enhancement (MORE) reviewed   Relaxation training   Coping skills and relapse prevention training   Emotional awareness and management   Effective Communication skills training   Mindfulness techniques training    Group Process:        Group rules    Review of group goals, structure, and rules   Values vs. Goals reviewed   Discussed values as personal qualities that guide patients' actions during recovery process    Reviewed the model of Smart Goals.    Discussed goals as desired results or concepts of the future.    Relapse preventive strategies were reviewed and discussed.    Discussed strategies to improve Self-care and Wellness   Discussed support and helpful strategies to promote recovery   Check in      Individual Patient Participation: Pt presented by video. Pt was engaged and reported feeling better. Pt is still copying with nephew's death but feels like she is accepting it more. No relapses/close calls reported. Enjoying being more physically active; reported being able to set and achieve goals.     Diagnosis (addressed by this group): Opioid Dependence on Agonist Therapy (Opioid Use Disorder on  Agonist Therapy)      Relevant changes in mental status: No major changes reported or observed     Plan: Pt will continue OBOT treatment     Cherlynn Perches, Ph.D.  .

## 2020-09-19 ENCOUNTER — Encounter (INDEPENDENT_AMBULATORY_CARE_PROVIDER_SITE_OTHER): Payer: Self-pay | Admitting: Family Medicine

## 2020-09-19 ENCOUNTER — Ambulatory Visit (INDEPENDENT_AMBULATORY_CARE_PROVIDER_SITE_OTHER): Payer: 59 | Admitting: Family Medicine

## 2020-09-19 ENCOUNTER — Other Ambulatory Visit: Payer: Self-pay

## 2020-09-19 VITALS — BP 149/83 | HR 74 | Temp 97.7°F | Ht <= 58 in | Wt 202.0 lb

## 2020-09-19 DIAGNOSIS — E559 Vitamin D deficiency, unspecified: Secondary | ICD-10-CM

## 2020-09-19 DIAGNOSIS — Z1331 Encounter for screening for depression: Secondary | ICD-10-CM

## 2020-09-19 DIAGNOSIS — R5383 Other fatigue: Secondary | ICD-10-CM | POA: Diagnosis not present

## 2020-09-19 DIAGNOSIS — E038 Other specified hypothyroidism: Secondary | ICD-10-CM | POA: Diagnosis not present

## 2020-09-19 DIAGNOSIS — R0602 Shortness of breath: Secondary | ICD-10-CM

## 2020-09-19 DIAGNOSIS — Z6841 Body Mass Index (BMI) 40.0 and over, adult: Secondary | ICD-10-CM

## 2020-09-19 DIAGNOSIS — Z0289 Encounter for other administrative examinations: Secondary | ICD-10-CM

## 2020-09-19 DIAGNOSIS — E1169 Type 2 diabetes mellitus with other specified complication: Secondary | ICD-10-CM | POA: Diagnosis not present

## 2020-09-19 DIAGNOSIS — E7849 Other hyperlipidemia: Secondary | ICD-10-CM

## 2020-09-19 DIAGNOSIS — I10 Essential (primary) hypertension: Secondary | ICD-10-CM

## 2020-09-20 LAB — CBC WITH DIFFERENTIAL
Basophils Absolute: 0.1 10*3/uL (ref 0.0–0.2)
Basos: 1 %
EOS (ABSOLUTE): 0.1 10*3/uL (ref 0.0–0.4)
Eos: 1 %
Hematocrit: 43.1 % (ref 34.0–46.6)
Hemoglobin: 13.7 g/dL (ref 11.1–15.9)
Immature Grans (Abs): 0 10*3/uL (ref 0.0–0.1)
Immature Granulocytes: 0 %
Lymphocytes Absolute: 3.1 10*3/uL (ref 0.7–3.1)
Lymphs: 29 %
MCH: 25.6 pg — ABNORMAL LOW (ref 26.6–33.0)
MCHC: 31.8 g/dL (ref 31.5–35.7)
MCV: 80 fL (ref 79–97)
Monocytes Absolute: 0.7 10*3/uL (ref 0.1–0.9)
Monocytes: 6 %
Neutrophils Absolute: 6.6 10*3/uL (ref 1.4–7.0)
Neutrophils: 63 %
RBC: 5.36 x10E6/uL — ABNORMAL HIGH (ref 3.77–5.28)
RDW: 14.4 % (ref 11.7–15.4)
WBC: 10.6 10*3/uL (ref 3.4–10.8)

## 2020-09-20 LAB — LIPID PANEL WITH LDL/HDL RATIO
Cholesterol, Total: 232 mg/dL — ABNORMAL HIGH (ref 100–199)
HDL: 95 mg/dL (ref >39)
LDL Chol Calc (NIH): 123 mg/dL — ABNORMAL HIGH (ref 0–99)
LDL/HDL Ratio: 1.3 ratio (ref 0.0–3.2)
Triglycerides: 85 mg/dL (ref 0–149)
VLDL Cholesterol Cal: 14 mg/dL (ref 5–40)

## 2020-09-20 LAB — COMPREHENSIVE METABOLIC PANEL
ALT: 23 IU/L (ref 0–32)
AST: 22 IU/L (ref 0–40)
Albumin/Globulin Ratio: 1.6 (ref 1.2–2.2)
Albumin: 4.7 g/dL (ref 3.8–4.8)
Alkaline Phosphatase: 85 IU/L (ref 44–121)
BUN/Creatinine Ratio: 17 (ref 12–28)
BUN: 14 mg/dL (ref 8–27)
Bilirubin Total: 0.5 mg/dL (ref 0.0–1.2)
CO2: 24 mmol/L (ref 20–29)
Calcium: 9 mg/dL (ref 8.7–10.3)
Chloride: 95 mmol/L — ABNORMAL LOW (ref 96–106)
Creatinine, Ser: 0.83 mg/dL (ref 0.57–1.00)
Globulin, Total: 3 g/dL (ref 1.5–4.5)
Glucose: 98 mg/dL (ref 65–99)
Potassium: 4.3 mmol/L (ref 3.5–5.2)
Sodium: 140 mmol/L (ref 134–144)
Total Protein: 7.7 g/dL (ref 6.0–8.5)
eGFR: 79 mL/min/{1.73_m2} (ref >59)

## 2020-09-20 LAB — FOLATE: Folate: >20 ng/mL (ref >3.0)

## 2020-09-20 LAB — INSULIN, RANDOM: INSULIN: 9.3 u[IU]/mL (ref 2.6–24.9)

## 2020-09-20 LAB — HEMOGLOBIN A1C
Est. average glucose Bld gHb Est-mCnc: 146 mg/dL
Hgb A1c MFr Bld: 6.7 % — ABNORMAL HIGH (ref 4.8–5.6)

## 2020-09-20 LAB — VITAMIN D 25 HYDROXY (VIT D DEFICIENCY, FRACTURES): Vit D, 25-Hydroxy: 56.1 ng/mL (ref 30.0–100.0)

## 2020-09-20 LAB — VITAMIN B12: Vitamin B-12: >2000 pg/mL — ABNORMAL HIGH (ref 232–1245)

## 2020-09-20 LAB — T3: T3, Total: 74 ng/dL (ref 71–180)

## 2020-09-20 LAB — TSH: TSH: 0.141 u[IU]/mL — ABNORMAL LOW (ref 0.450–4.500)

## 2020-09-20 LAB — T4: T4, Total: 11.9 ug/dL (ref 4.5–12.0)

## 2020-09-25 ENCOUNTER — Ambulatory Visit: Payer: 59 | Attending: Family Medicine | Admitting: Family Medicine

## 2020-09-25 ENCOUNTER — Ambulatory Visit (HOSPITAL_BASED_OUTPATIENT_CLINIC_OR_DEPARTMENT_OTHER): Payer: 59 | Admitting: Clinical

## 2020-09-25 DIAGNOSIS — F1120 Opioid dependence, uncomplicated: Secondary | ICD-10-CM | POA: Insufficient documentation

## 2020-09-25 MED ORDER — BUPRENORPHINE HCL-NALOXONE HCL 8-2 MG SL FILM
ORAL_FILM | SUBLINGUAL | 0 refills | Status: DC
Start: 2020-09-25 — End: 2020-10-09

## 2020-09-25 NOTE — Progress Notes (Signed)
Cheryl Dixon is a 65 year old female seen in follow up for opioid dependence:    Buprenorphine dose: 20mg   Response, adequacy of dose: good  Relapses/close calls: none  Trigger: denies  Meetings: none currently    Social history/events:  Did not have much to report today.    Present Medications:  nicotine (NICODERM CQ) 21 MG/24HR, APPLY 1 PATCH TOPICALLY DAILY, Disp: 28 patch, Rfl: 1  buprenorphine-naloxone (SUBOXONE) 8-2 MG sublingual film, Take two and one-half films under the tongue daily., Disp: 35 Film, Rfl: 0  polyethylene glycol (HEALTHYLAX) 17 g packet, TAKE 17G BY MOUTH DAILY MIX IN 64 OUNCE OF CLEAR LIQUID AND TAKE as directed, Disp: 28 packet, Rfl: 2  estradiol (ESTRACE VAGINAL) 0.1 MG/GM vaginal cream, Place 4 g vaginally 3 (three) times a week, Disp: 127.5 g, Rfl: 1  naproxen (NAPROSYN) 500 MG tablet, TAKE 1 TABLET BY MOUTH WITH FOOD EVERY TWELVE HOURS AS NEEDED FOR PAIN, Disp: 20 tablet, Rfl: 0  cholecalciferol (HM VITAMIN D3) 1000 UNIT tablet, Take 1,000 Units by mouth daily, Disp: , Rfl:   acetaminophen (TYLENOL) 500 MG tablet, Take 500 mg by mouth every 6 (six) hours as needed for Pain, Disp: , Rfl:   umeclidinium (INCRUSE ELLIPTA) 62.5 MCG/INH inhaler, INHALE 1 PUFF BY MOUTH INTO LUNGS DAILY, Disp: 1 each, Rfl: 11  buPROPion (WELLBUTRIN SR) 200 MG 12 hr tablet, Take 1 tablet by mouth 2 (two) times daily, Disp: 180 tablet, Rfl: 3  doxepin (SINEQUAN) 10 MG capsule, Take 1-2 capsules by mouth daily  for 14 days, Disp: 28 capsule, Rfl: 0  albuterol (VENTOLIN HFA) 108 (90 Base) MCG/ACT inhaler, INHALE 2 PUFFS INTO THE LUNGS EVERY 6 HOURS AS NEEDED FOR WHEEZING OR SHORTNESS OF BREATH, Disp: 8.5 g, Rfl: 11  ipratropium-albuterol (DUO-NEB) 0.5-2.5 (3) MG/3ML SOLN Inhalation Solution, Take 3 mLs by nebulization 4 (four) times daily., Disp: 180 mL, Rfl: 0    No current facility-administered medications on file prior to visit.      PHQ-9 TOTAL SCORE 01/12/2019 06/01/2018 03/03/2017   Doc FlowSheet Total Score - -  -   Doc FlowSheet Total Score 10 6 4            PHYSICAL EXAMINATION via video:  General appearance - healthy female in no distress  Neuro - nonfocal  Affect - normal  Behavior without evidence of intoxication or impairment    PMP Reviewed.  No unauthorized prescriptions since last refill.     Assessment:  Opioid Dependence  Comment: Stable recovery. Continue current plan.  Plan:  Continue buprenorphine, reviewed criteria for tapering when patient is ready. Discussed sources of support.  Patient is counseled regarding relapse prevention, involvement in recovery groups, potential side effects of buprenorphine, and eventual taper of medication.    For the patient participating in the group via telephone and/or video technology: the patient/guardian has verbally consented to participate in this group therapy session by televisit. The patient/guardian was encouraged to be in a private location due to personal health information being discussed and where they could not be overheard by individuals not participating in the group therapy. Although all [facility] staff/providers participating in this group therapy televisit are in a private location, all the risks of telephone and/or video technology used to conduct this group therapy session cannot be controlled by [place], (i.e. interruptions, unauthorized access/recording and technical difficulties).  Each patient understands that the visit can be stopped at any time for any reason, including if the conferencing connections are inadequate.  Geni Bers, MD, 09/25/2020

## 2020-09-25 NOTE — Progress Notes (Signed)
Chief Complaint:   OBESITY Michelle Roberts (MR# [id number]) is a 65 y.o. female who presents for evaluation and treatment of obesity and related comorbidities. Current BMI is Body mass index is 42.22 kg/m. Michelle Roberts has been struggling with her weight for many years and has been unsuccessful in either losing weight, maintaining weight loss, or reaching her healthy weight goal.  Michelle Roberts is currently in the action stage of change and ready to dedicate time achieving and maintaining a healthier weight. Michelle Roberts is interested in becoming our patient and working on intensive lifestyle modifications including (but not limited to) diet and exercise for weight loss.  Michelle Roberts's habits were reviewed today and are as follows: she thinks her family will eat healthier with her, she struggles with family and or coworkers weight loss sabotage, her desired weight loss is 72 lbs, she has been heavy most of her life, she started gaining weight after child birth, her heaviest weight ever was 240 pounds, she snacks frequently in the evenings, she skips meals frequently, she is frequently drinking liquids with calories, she frequently makes poor food choices, she frequently eats larger portions than normal and she struggles with emotional eating.  Depression Screen Michelle Roberts's Food and Mood (modified PHQ-9) score was 6.  Depression screen [facility] 2/9 09/19/2020  Decreased Interest 2  Down, Depressed, Hopeless 1  PHQ - 2 Score 3  Altered sleeping 1  Tired, decreased energy 1  Change in appetite 0  Feeling bad or failure about yourself  1  Trouble concentrating 1  Moving slowly or fidgety/restless 0  PHQ-9 Score 7   Subjective:   1. Other fatigue Michelle Roberts admits to daytime somnolence and admits to waking up still tired. Patent has a history of symptoms of daytime fatigue. Michelle Roberts generally gets 5 hours of sleep per night, and states that she has difficulty falling asleep. Snoring is present. Apneic episodes are not present.  Epworth Sleepiness Score is 7.  2. SOB (shortness of breath) on exertion Michelle Roberts notes increasing shortness of breath with exercising and seems to be worsening over time with weight gain. She notes getting out of breath sooner with activity than she used to. This has not gotten worse recently. Michelle Roberts denies shortness of breath at rest or orthopnea.  3. Type 2 diabetes mellitus with other specified complication, without long-term current use of insulin (HCC) Michelle Roberts blood sugar control is important to decrease the likelihood of diabetic complications such as nephropathy, neuropathy, limb loss, blindness, coronary artery disease, and death. Intensive lifestyle modification including diet, exercise and weight loss are the first line of treatment for diabetes.   4. Other specified hypothyroidism Michelle Roberts is on metformin, and she is working on diet, exercise, and weight loss. She has no recent A1c in Epic.  5. Vitamin D deficiency Michelle Roberts is at high risk of low Vit D , and she notes fatigue. She is on Calcium plus Vit D.  6. Other hyperlipidemia Michelle Roberts is on Lipitor and MCT oil to help decrease her cholesterol. She is working on diet as well.  7. Essential hypertension Michelle Roberts's blood pressure is above goal today. She is working on diet and exercise. Her blood pressure may be elevated due to being a new patient today.  Assessment/Plan:   1. Other fatigue Michelle Roberts does feel that her weight is causing her energy to be lower than it should be. Fatigue may be related to obesity, depression or many other causes. Labs will be ordered, and in the meanwhile, Michelle Roberts will  focus on self care including making healthy food choices, increasing physical activity and focusing on stress reduction.  - Comprehensive metabolic panel - Vitamin Z66 - EKG 12-Lead  2. SOB (shortness of breath) on exertion Michelle Roberts does feel that she gets out of breath more easily that she used to when she exercises. Michelle Roberts's shortness of breath appears  to be obesity related and exercise induced. She has agreed to work on weight loss and gradually increase exercise to treat her exercise induced shortness of breath. Will continue to monitor closely.  3. Type 2 diabetes mellitus with other specified complication, without long-term current use of insulin (Michelle Roberts) We will check labs today, and Michelle Roberts will start on her eating plan. Michelle Roberts blood sugar control is important to decrease the likelihood of diabetic complications such as nephropathy, neuropathy, limb loss, blindness, coronary artery disease, and death. Intensive lifestyle modification including diet, exercise and weight loss are the first line of treatment for diabetes.   - Insulin, random - Hemoglobin A1c  4. Other specified hypothyroidism We will check labs today. Michelle Roberts will continue to follow up as directed. Orders and follow up as documented in patient record.  - TSH - Folate - T3 - T4  5. Vitamin D deficiency Low Vitamin D level contributes to fatigue and are associated with obesity, breast, and colon cancer. We will check labs today. Michelle Roberts will follow-up for routine testing of Vitamin D, at least 2-3 times per year to avoid over-replacement.  - VITAMIN D 25 Hydroxy (Vit-D Deficiency, Fractures)  6. Other hyperlipidemia Cardiovascular risk and specific lipid/LDL goals reviewed. We discussed several lifestyle modifications today. We will check labs today. Michelle Roberts will start on her eating plan, and will exercise and weight loss efforts. Orders and follow up as documented in patient record.   - Lipid Panel With LDL/HDL Ratio - CBC With Differential  7. Essential hypertension Michelle Roberts will start on her eating plan, and will work on healthy weight loss and exercise to improve blood pressure control. We will recheck her blood pressure in 2 weeks.  8. Screening for depression Michelle Roberts had a positive depression screening. Depression is commonly associated with obesity and often results in  emotional eating behaviors. We will monitor this closely and work on CBT to help improve the non-hunger eating patterns. Referral to Psychology may be required if no improvement is seen as she continues in our clinic.  9. Obesity with current BMI 42.3 Michelle Roberts is currently in the action stage of change and her goal is to continue with weight loss efforts. I recommend Michelle Roberts begin the structured treatment plan as follows:  She has agreed to the [employer] with poultry options.  Exercise goals: No exercise has been prescribed for now, while we concentrate on nutritional changes.  Behavioral modification strategies: increasing lean protein intake, decreasing simple carbohydrates and meal planning and cooking strategies.  She was informed of the importance of frequent follow-up visits to maximize her success with intensive lifestyle modifications for her multiple health conditions. She was informed we would discuss her lab results at her next visit unless there is a critical issue that needs to be addressed sooner. Camara agreed to keep her next visit at the agreed upon time to discuss these results.  Objective:   Blood pressure (!) 149/83, pulse 74, temperature 97.7 F (36.5 C), height 4\' 10"  (1.473 m), weight 202 lb (91.6 kg), SpO2 98 %. Body mass index is 42.22 kg/m.  EKG: Normal sinus rhythm, rate 75 BPM.  Indirect Calorimeter  completed today shows a VO2 of 267 and a REE of 1861.  Her calculated basal metabolic rate is 3612 thus her basal metabolic rate is better than expected.  General: Cooperative, alert, well developed, in no acute distress. HEENT: Conjunctivae and lids unremarkable. Cardiovascular: Regular rhythm.  Lungs: Normal work of breathing. Neurologic: No focal deficits.   Lab Results  Component Value Date   CREATININE 0.83 09/19/2020   BUN 14 09/19/2020   NA 140 09/19/2020   K 4.3 09/19/2020   CL 95 (L) 09/19/2020   CO2 24 09/19/2020   Lab Results  Component Value  Date   ALT 23 09/19/2020   AST 22 09/19/2020   ALKPHOS 85 09/19/2020   BILITOT 0.5 09/19/2020   Lab Results  Component Value Date   HGBA1C 6.7 (H) 09/19/2020   Lab Results  Component Value Date   INSULIN 9.3 09/19/2020   Lab Results  Component Value Date   TSH 0.141 (L) 09/19/2020   Lab Results  Component Value Date   CHOL 232 (H) 09/19/2020   HDL 95 09/19/2020   LDLCALC 123 (H) 09/19/2020   TRIG 85 09/19/2020   Lab Results  Component Value Date   WBC 10.6 09/19/2020   HGB 13.7 09/19/2020   HCT 43.1 09/19/2020   MCV 80 09/19/2020   PLT 248 01/08/2016   No results found for: IRON, TIBC, FERRITIN  Attestation Statements:   Reviewed by clinician on day of visit: allergies, medications, problem list, medical history, surgical history, family history, social history, and previous encounter notes.  Time spent on visit including pre-visit chart review and post-visit charting and care was 68 minutes.    I, Trixie Dredge, am acting as transcriptionist for Dennard Nip, MD.  I have reviewed the above documentation for accuracy and completeness, and I agree with the above. - Dennard Nip, MD

## 2020-10-01 ENCOUNTER — Other Ambulatory Visit (HOSPITAL_BASED_OUTPATIENT_CLINIC_OR_DEPARTMENT_OTHER): Payer: Self-pay | Admitting: Internal Medicine

## 2020-10-01 DIAGNOSIS — N952 Postmenopausal atrophic vaginitis: Secondary | ICD-10-CM

## 2020-10-01 MED ORDER — ESTRADIOL 0.1 MG/GM VA CREA
4.0000 g | TOPICAL_CREAM | VAGINAL | 1 refills | Status: DC
Start: 2020-10-01 — End: 2021-01-04

## 2020-10-01 NOTE — Telephone Encounter (Signed)
PER Pharmacy, Cheryl Dixon is a 65 year old female has requested a refill of      -  estradiol        Last Office Visit:5/31/22with [facility]   Last Physical Exam: 05/27/13     COLONOSCOPY due on 08/22/2017     Other Med Adult:  Most Recent BP Reading(s)  03/19/20 : 132/86        Cholesterol (mg/dl)   Date Value   01/04/2010 264 (H)     LOW DENSITY LIPOPROTEIN DIRECT (mg/dl)   Date Value   01/04/2010 101 (H)     HIGH DENSITY LIPOPROTEIN (mg/dl)   Date Value   01/04/2010 38     No results found for: TG      THYROID SCREEN TSH REFLEX FT4 (uIU/mL)   Date Value   01/12/2019 1.630         No results found for: TSH    HEMOGLOBIN A1C (%)   Date Value   01/12/2019 4.6       No results found for: POCA1C      INR (no units)   Date Value   02/16/2007 1.0 (L)   07/03/2006 < 1.0 (L)       SODIUM (mmol/L)   Date Value   03/01/2020 144       POTASSIUM (mmol/L)   Date Value   03/01/2020 4.3           CREATININE (mg/dL)   Date Value   03/01/2020 1.1        Documented patient preferred pharmacies:    [id number] - [place], [place]  Phone: [phone] Fax: [place], [place]  Phone: [phone] Fax: [phone]

## 2020-10-03 ENCOUNTER — Encounter (INDEPENDENT_AMBULATORY_CARE_PROVIDER_SITE_OTHER): Payer: Self-pay | Admitting: Family Medicine

## 2020-10-03 ENCOUNTER — Ambulatory Visit (INDEPENDENT_AMBULATORY_CARE_PROVIDER_SITE_OTHER): Payer: 59 | Admitting: Family Medicine

## 2020-10-03 ENCOUNTER — Other Ambulatory Visit: Payer: Self-pay

## 2020-10-03 VITALS — BP 147/84 | HR 109 | Temp 98.2°F | Ht <= 58 in | Wt 193.0 lb

## 2020-10-03 DIAGNOSIS — I10 Essential (primary) hypertension: Secondary | ICD-10-CM

## 2020-10-03 DIAGNOSIS — E66813 Obesity, class 3: Secondary | ICD-10-CM

## 2020-10-03 DIAGNOSIS — E1169 Type 2 diabetes mellitus with other specified complication: Secondary | ICD-10-CM | POA: Diagnosis not present

## 2020-10-03 DIAGNOSIS — Z6841 Body Mass Index (BMI) 40.0 and over, adult: Secondary | ICD-10-CM

## 2020-10-04 LAB — CBC: RBC: 5.22 — AB (ref 3.87–5.11)

## 2020-10-04 LAB — HEPATIC FUNCTION PANEL
ALT: 22 (ref 7–35)
AST: 23 (ref 13–35)
Alkaline Phosphatase: 73 (ref 25–125)
Bilirubin, Total: 0.3

## 2020-10-04 LAB — IRON,TIBC AND FERRITIN PANEL: Ferritin: 456

## 2020-10-04 LAB — CBC AND DIFFERENTIAL
HCT: 42 (ref 36–46)
Hemoglobin: 13.3 (ref 12.0–16.0)
Platelets: 252 (ref 150–399)
WBC: 8.9

## 2020-10-04 LAB — TSH: TSH: 0.07 — AB (ref 0.41–5.90)

## 2020-10-04 LAB — BASIC METABOLIC PANEL
BUN: 22 — AB (ref 4–21)
Creatinine: 1 (ref 0.5–1.1)
Glucose: 93

## 2020-10-09 ENCOUNTER — Ambulatory Visit (HOSPITAL_BASED_OUTPATIENT_CLINIC_OR_DEPARTMENT_OTHER): Payer: 59 | Admitting: Clinical

## 2020-10-09 ENCOUNTER — Other Ambulatory Visit (HOSPITAL_BASED_OUTPATIENT_CLINIC_OR_DEPARTMENT_OTHER): Payer: Self-pay | Admitting: Family Medicine

## 2020-10-09 ENCOUNTER — Ambulatory Visit (HOSPITAL_BASED_OUTPATIENT_CLINIC_OR_DEPARTMENT_OTHER): Payer: 59 | Admitting: Family Medicine

## 2020-10-09 DIAGNOSIS — F1120 Opioid dependence, uncomplicated: Secondary | ICD-10-CM

## 2020-10-09 MED ORDER — BUPRENORPHINE HCL-NALOXONE HCL 8-2 MG SL FILM
ORAL_FILM | SUBLINGUAL | 0 refills | Status: DC
Start: 2020-10-09 — End: 2020-10-23

## 2020-10-09 NOTE — Telephone Encounter (Signed)
PER Patient (self), Cheryl Dixon is a 65 year old female has requested a refill of Suboxone 8-2 mg sublingual film      Last prescribed - start date: 09/25/20 end date: 10/09/20     Last Office Visit: 09/25/20  Last Physical Exam: 05/27/13      COLONOSCOPY due on 08/22/2017     Other Med Adult:  Most Recent BP Reading(s)  03/19/20 : 132/86        Cholesterol (mg/dl)   Date Value   01/04/2010 264 (H)     LOW DENSITY LIPOPROTEIN DIRECT (mg/dl)   Date Value   01/04/2010 101 (H)     HIGH DENSITY LIPOPROTEIN (mg/dl)   Date Value   01/04/2010 38     No results found for: TG      THYROID SCREEN TSH REFLEX FT4 (uIU/mL)   Date Value   01/12/2019 1.630         No results found for: TSH    HEMOGLOBIN A1C (%)   Date Value   01/12/2019 4.6       No results found for: POCA1C      INR (no units)   Date Value   02/16/2007 1.0 (L)   07/03/2006 < 1.0 (L)       SODIUM (mmol/L)   Date Value   03/01/2020 144       POTASSIUM (mmol/L)   Date Value   03/01/2020 4.3           CREATININE (mg/dL)   Date Value   03/01/2020 1.1        Documented patient preferred pharmacies:    [id number] - [place], [place] - [place]  Phone: [phone] Fax: [phone]

## 2020-10-11 ENCOUNTER — Encounter (HOSPITAL_BASED_OUTPATIENT_CLINIC_OR_DEPARTMENT_OTHER): Payer: Self-pay

## 2020-10-11 NOTE — Progress Notes (Signed)
Chief Complaint:   OBESITY Lalana is here to discuss her progress with her obesity treatment plan along with follow-up of her obesity related diagnoses. Courtnay is on the [employer] with poultry options and states she is following her eating plan approximately 75-80% of the time. Ioma states she is doing 0 minutes 0 times per week.  Today's visit was #: 2 Starting weight: 202 lbs Starting date: 06/22/2020 Today's weight: 193 lbs Today's date: 10/03/2020 Total lbs lost to date: 9 Total lbs lost since last in-office visit: 9  Interim History: Sephira has done well with weight loss on her Pescatarian plan. Her hunger is mostly controlled. She would like to make a few small adjustments to the plan.  Subjective:   1. Type 2 diabetes mellitus with other specified complication, without long-term current use of insulin (HCC) Ivelise's A1c is above 6.5, but it is still well controlled with diet. She is doing well with weight loss which should continue to improve her diabetes mellitus as well as her health overall.   2. Essential hypertension Abbygale's blood pressure at home mostly runs at 110 over 60-70's, but she appears to have white coat syndrome and her blood pressure is often elevated in the office.  Assessment/Plan:   1. Type 2 diabetes mellitus with other specified complication, without long-term current use of insulin ([place]) Loreli Slot will continue with diet, exercise, and weight loss, and we will continue to monitor. Good blood sugar control is important to decrease the likelihood of diabetic complications such as nephropathy, neuropathy, limb loss, blindness, coronary artery disease, and death. Intensive lifestyle modification including diet, exercise and weight loss are the first line of treatment for diabetes.   2. Essential hypertension Dezaray will continue with diet and exercise to improve blood pressure control. We will continue to monitor as she continues her lifestyle  modifications.  3. Obesity witrh current BMI 40.5 Yurianna is currently in the action stage of change. As such, her goal is to continue with weight loss efforts. She has agreed to the [employer] with extra 100 calories for protein snacks.   Behavioral modification strategies: increasing lean protein intake and increasing water intake.  Sherron has agreed to follow-up with our clinic in 2 weeks. She was informed of the importance of frequent follow-up visits to maximize her success with intensive lifestyle modifications for her multiple health conditions.   Objective:   Blood pressure (!) 147/84, pulse (!) 109, temperature 98.2 F (36.8 C), height 4\' 10"  (1.473 m), weight 193 lb (87.5 kg), SpO2 98 %. Body mass index is 40.34 kg/m.  General: Cooperative, alert, well developed, in no acute distress. HEENT: Conjunctivae and lids unremarkable. Cardiovascular: Regular rhythm.  Lungs: Normal work of breathing. Neurologic: No focal deficits.   Lab Results  Component Value Date   CREATININE 0.83 09/19/2020   BUN 14 09/19/2020   NA 140 09/19/2020   K 4.3 09/19/2020   CL 95 (L) 09/19/2020   CO2 24 09/19/2020   Lab Results  Component Value Date   ALT 23 09/19/2020   AST 22 09/19/2020   ALKPHOS 85 09/19/2020   BILITOT 0.5 09/19/2020   Lab Results  Component Value Date   HGBA1C 6.7 (H) 09/19/2020   Lab Results  Component Value Date   INSULIN 9.3 09/19/2020   Lab Results  Component Value Date   TSH 0.141 (L) 09/19/2020   Lab Results  Component Value Date   CHOL 232 (H) 09/19/2020   HDL 95 09/19/2020  LDLCALC 123 (H) 09/19/2020   TRIG 85 09/19/2020   Lab Results  Component Value Date   WBC 10.6 09/19/2020   HGB 13.7 09/19/2020   HCT 43.1 09/19/2020   MCV 80 09/19/2020   PLT 248 01/08/2016   No results found for: IRON, TIBC, FERRITIN  Attestation Statements:   Reviewed by clinician on day of visit: allergies, medications, problem list, medical history, surgical  history, family history, social history, and previous encounter notes.  Time spent on visit including pre-visit chart review and post-visit care and charting was 42 minutes.    I, Trixie Dredge, am acting as transcriptionist for Dennard Nip, MD.  I have reviewed the above documentation for accuracy and completeness, and I agree with the above. -  Dennard Nip, MD

## 2020-10-16 LAB — COMPREHENSIVE METABOLIC PANEL
Albumin: 4.5 (ref 3.5–5.0)
Calcium: 9.2 (ref 8.7–10.7)
Globulin: 2.4

## 2020-10-18 ENCOUNTER — Other Ambulatory Visit (HOSPITAL_BASED_OUTPATIENT_CLINIC_OR_DEPARTMENT_OTHER): Payer: Self-pay

## 2020-10-18 MED ORDER — NAPROXEN 500 MG PO TABS
ORAL_TABLET | ORAL | 0 refills | Status: DC
Start: 2020-10-18 — End: 2020-11-18

## 2020-10-18 NOTE — Telephone Encounter (Addendum)
Called patient back, she reports provider prescribed estrogen cream for her last March (2021) it took a while but she finally started feeling better (vaginal irritation and hot flashes).  Pharmacy changed brands last month and now sxs are returning, reporting irritation is flaring up and she has started having hot flashes again. Wondering if due to generic brand, would like to go back to previous.    Advised I would reach out to pharmacy to determine what changed.    Also requesting provider send in new Rx for naproxen.      [facility], they confirmed they did change brands but med should work the same, previous brand was [employer], currently they use [employer].  They can no longer order Mylan, suggest trying different pharmacy if patient needs different brand.      ----- Message from Kern Alberta sent at 10/18/2020  9:57 AM EDT -----  [name] [id number], 64 year old, female    Calls today:  Clinical Questions ([place])    Name of person calling patient  Specific nature of request would like a call back regarding side effects from estrace vaginal cream  Return phone number [phone]    Person calling on behalf of patient: Patient (self)      Patient's language of care: English    Patient does not need an interpreter.    Patient's PCP: [name], MD

## 2020-10-18 NOTE — Telephone Encounter (Signed)
Called patient back advised her of info found out from [facility], suggested she may try other pharmacies to see what brand they have, offered to see what brand [facility] uses, declined for now, will continue with current med for now and call back with any other issues/concerns or if sxs do not resolve.

## 2020-10-22 ENCOUNTER — Telehealth (HOSPITAL_BASED_OUTPATIENT_CLINIC_OR_DEPARTMENT_OTHER): Payer: Self-pay

## 2020-10-22 ENCOUNTER — Other Ambulatory Visit: Payer: Self-pay

## 2020-10-22 ENCOUNTER — Ambulatory Visit (HOSPITAL_BASED_OUTPATIENT_CLINIC_OR_DEPARTMENT_OTHER): Payer: 59 | Admitting: Internal Medicine

## 2020-10-22 ENCOUNTER — Ambulatory Visit: Payer: 59 | Attending: Internal Medicine | Admitting: Internal Medicine

## 2020-10-22 ENCOUNTER — Encounter (INDEPENDENT_AMBULATORY_CARE_PROVIDER_SITE_OTHER): Payer: Self-pay | Admitting: Bariatrics

## 2020-10-22 ENCOUNTER — Ambulatory Visit (INDEPENDENT_AMBULATORY_CARE_PROVIDER_SITE_OTHER): Payer: 59 | Admitting: Bariatrics

## 2020-10-22 VITALS — BP 138/82 | HR 103 | Temp 98.6°F | Ht <= 58 in | Wt 191.0 lb

## 2020-10-22 VITALS — BP 132/94 | HR 61 | Temp 97.0°F | Wt 196.0 lb

## 2020-10-22 DIAGNOSIS — R1032 Left lower quadrant pain: Secondary | ICD-10-CM | POA: Diagnosis present

## 2020-10-22 DIAGNOSIS — R300 Dysuria: Secondary | ICD-10-CM | POA: Diagnosis present

## 2020-10-22 DIAGNOSIS — Z6832 Body mass index (BMI) 32.0-32.9, adult: Secondary | ICD-10-CM

## 2020-10-22 DIAGNOSIS — E785 Hyperlipidemia, unspecified: Secondary | ICD-10-CM | POA: Insufficient documentation

## 2020-10-22 DIAGNOSIS — R030 Elevated blood-pressure reading, without diagnosis of hypertension: Secondary | ICD-10-CM | POA: Diagnosis not present

## 2020-10-22 DIAGNOSIS — Z13220 Encounter for screening for lipoid disorders: Secondary | ICD-10-CM | POA: Diagnosis present

## 2020-10-22 DIAGNOSIS — E7849 Other hyperlipidemia: Secondary | ICD-10-CM

## 2020-10-22 DIAGNOSIS — I10 Essential (primary) hypertension: Secondary | ICD-10-CM

## 2020-10-22 DIAGNOSIS — Z6841 Body Mass Index (BMI) 40.0 and over, adult: Secondary | ICD-10-CM

## 2020-10-22 LAB — BASIC METABOLIC PANEL
ANION GAP: 13 mmol/L (ref 10–22)
BUN (UREA NITROGEN): 16 mg/dL (ref 7–18)
CALCIUM: 9.2 mg/dl (ref 8.5–10.1)
CARBON DIOXIDE: 28 mmol/L (ref 21–32)
CHLORIDE: 102 mmol/L (ref 98–107)
CREATININE: 0.9 mg/dL (ref 0.4–1.2)
ESTIMATED GLOMERULAR FILT RATE: >60 mL/min (ref >60)
Glucose Random: 91 mg/dL (ref 74–160)
POTASSIUM: 4.5 mmol/L (ref 3.5–5.1)
SODIUM: 143 mmol/L (ref 136–145)

## 2020-10-22 LAB — POC URINALYSIS
BILIRUBIN, URINE: NEGATIVE
GLUCOSE,URINE: NEGATIVE
LEUKOCYTE ESTERASE: NEGATIVE
NITRITE, URINE: POSITIVE — AB
OCCULT BLOOD, URINE: NEGATIVE
PH URINE: 6 (ref 5.0–8.0)
SPECIFIC GRAVITY, URINE: 1.025 (ref 1.003–1.030)
UROBILINOGEN URINE: 0.2 (ref 0.2–1.0)

## 2020-10-22 LAB — CHOLESTEROL: Cholesterol: 233 mg/dL (ref 0–239)

## 2020-10-22 LAB — LOW DENSITY LIPOPROTEIN DIRECT: LOW DENSITY LIPOPROTEIN DIRECT: 158 mg/dL (ref 0–189)

## 2020-10-22 LAB — HIGH DENSITY LIPOPROTEIN: HIGH DENSITY LIPOPROTEIN: 40 mg/dL (ref 40–60)

## 2020-10-22 MED ORDER — NITROFURANTOIN MONOHYD MACRO 100 MG PO CAPS
100.00 mg | ORAL_CAPSULE | Freq: Two times a day (BID) | ORAL | 0 refills | Status: AC
Start: 2020-10-22 — End: 2020-10-27

## 2020-10-22 NOTE — Progress Notes (Signed)
Cheryl Dixon is a 65 year old female here for low abdominal pain    Patient Active Problem List:     Opioid dependence on agonist therapy ([place])     Tobacco use disorder     Fibrocystic breast     Elevated BP     Knee pain     Chronic low back pain     OA (osteoarthritis) of knee     H. pylori infection     Major depressive disorder, recurrent episode, severe (HCC)     Occult blood in stools     Prediabetes     Epigastric pain     Chronic obstructive pulmonary disease (HCC)     Left foot pain     Adenomatous polyp of colon     Acquired deformity of toe, right     Dislocation of metatarsophalangeal joint of lesser toe, right, sequela     Postmenopausal atrophic vaginitis     Abnormal loss of weight     Thoracic aortic aneurysm without rupture ([place])     COVID-19 virus infection    For last 10 days  Has been off and on for a long time  Left lower quadrant pain  crampy - lighter since voided as below  6/10, gets up to 8/10  Feels into back on left  Last BM yesterday - normal, nonbloody  Voiding more often, +dysuria  Took tylenol pm last night - thinks made void less, not taking naproxen - afraid to  Feeling like has to pee a lot  Only 1 urine today  suboxone helps for a while, if late - feels   No fever or chills  No itching, burning or vaginal bleeding   No mid back pain  No nausea or vomiting    Most Recent BP Reading(s)  10/22/20 : (!) 132/94  03/19/20 : 132/86  03/08/20 : (!) 140/97  03/01/20 : 139/91  06/30/19 : (!) 142/80    Most Recent Weight Reading(s)  10/22/20 : 88.9 kg (196 lb)  03/08/20 : 91.6 kg (202 lb)  03/01/20 : 81.6 kg (180 lb)  06/30/19 : 83.9 kg (185 lb)  01/27/19 : 88 kg (194 lb)    PE:  BP (!) 132/94    Pulse 61    Temp 97 F (36.1 C) (Temporal)    Wt 88.9 kg (196 lb)    LMP 11/20/1991    SpO2 97%    BMI 32.62 kg/m   Estimated body mass index is 32.62 kg/m as calculated from the following:    Height as of 03/08/20: 5\' 5"  (1.651 m).    Weight as of this encounter: 88.9 kg (196  lb).  Gen - Upright in chair in NAD  HEENT - MMM  CV - RRR, no m/r/g  Resp - CTAB  Abd - +BS, soft, ND, min LLQ pain, no rebound, guarding or CVAT  Neuro - A+Ox3  Psych - Appropriate    Udip  Component      Latest Ref Rng & Units 10/22/2020   COLOR      YELLOW YELLOW   CLARITY      CLEAR CLOUDY (A)   GLUCOSE, URINE      NEGATIVE NEGATIVE   BILIRUBIN, URINE      NEGATIVE NEGATIVE   KETONE, URINE      NEGATIVE TRACE (A)   SPECIFIC GRAVITY URINE      1.003 - 1.030 1.025   UROBILINOGEN      0.2 -  1.0 0.2   OCCULT BLOOD, URINE      NEGATIVE NEGATIVE   PH URINE      5.0 - 8.0 6.0   PROTEIN, URINE      NEGATIVE TRACE   NITRITE, URINE      NEGATIVE POSITIVE (A)   LEUKOCYTE ESTERASE      NEGATIVE NEGATIVE       A/P:  (R10.32) LLQ pain  (primary encounter diagnosis)  (R30.0) Dysuria  Comment: After discussion of risk and benefits, patient agrees to empiric cystitis treatment - macrobid  Plan: BASIC METABOLIC PANEL    (J50.518) Screening for lipid disorders  Plan: CHOLESTEROL, HIGH DENSITY LIPOPROTEIN, LOW         DENSITY LIPOPROTEIN DIRECT          (R03.0) Elevated BP without diagnosis of hypertension  Comment: mild elevation  Plan: Discussed low salt diet, exercise    (Z68.32) BMI 32.0-32.9,adult  Comment: decreasing  Plan: Discussed portion control, healthy food choice, exercise

## 2020-10-22 NOTE — Telephone Encounter (Addendum)
Patient identification verified by 2 forms.    Call to patient reports she did not pick up the naproxen.   Thinks its not going to work for her.   Is concerned her medication is masking the cause of the pain.   Pain is located to lower left side and radiates to back.   When she is able to urinate it is small amounts, burning with urination   Is using premarin cream, taking Azo drinking cranberry juice.   Does not think it a UTI.   Denied fever and or vaginal discharge.   However, requesting an antibiotic without appt.   Advised needs eval prior to any medications.   Scheduled for today with Dr Jamal Collin @ 2 PM.   Patient accepted appt and agreed with plan.             ----- Message from London Pepper sent at 10/22/2020 12:04 PM EDT -----  Cheryl Dixon [id number], 65 year old, female    Calls today:  Medication Questions  Patient Calling    What medication do you have questions about Naproxen    Person calling on behalf of patient: Patient (self)    CALL BACK NUMBER: [phone]  Best time to call back:   Cell phone:   Other phone:    Patient's language of care: English    Patient does not need an interpreter.    Patient's PCP: [name], MD

## 2020-10-23 ENCOUNTER — Ambulatory Visit: Payer: 59 | Attending: Family Medicine | Admitting: Family Medicine

## 2020-10-23 ENCOUNTER — Ambulatory Visit: Payer: 59 | Attending: Psychosomatic Medicine | Admitting: Clinical

## 2020-10-23 ENCOUNTER — Encounter (HOSPITAL_BASED_OUTPATIENT_CLINIC_OR_DEPARTMENT_OTHER): Payer: Self-pay

## 2020-10-23 ENCOUNTER — Telehealth (HOSPITAL_BASED_OUTPATIENT_CLINIC_OR_DEPARTMENT_OTHER): Payer: Self-pay | Admitting: Internal Medicine

## 2020-10-23 DIAGNOSIS — F1120 Opioid dependence, uncomplicated: Secondary | ICD-10-CM | POA: Diagnosis present

## 2020-10-23 LAB — URINE CULTURE

## 2020-10-23 MED ORDER — BUPRENORPHINE HCL-NALOXONE HCL 8-2 MG SL FILM
ORAL_FILM | SUBLINGUAL | 0 refills | Status: DC
Start: 2020-10-23 — End: 2020-11-06

## 2020-10-23 NOTE — Progress Notes (Signed)
OBOT (OFFICE BASED OPIOID TREATMENT)  GROUP PROGRESS NOTE     Televisit consent, as applicable:  For the patient participating in the group via telephone and/or video technology: The patient/guardian has verbally consented to participate in this group therapy session by televisit. The patient/guardian was encouraged to be in a private location due to personal health information being discussed and where they could not be overheard by individuals not participating in the group therapy. Although all [place] staff/providers participating in this group therapy televisit are in a private location, all the risks of telephone and/or video technology used to conduct this group therapy session cannot be controlled by [place], (i.e. interruptions, unauthorized access/recording and technical difficulties).  Each patient understands that the visit can be stopped at any time for any reason, including if the conferencing connections are inadequate.     GROUP NAME:  Office Based Opioid Treatment Group     LEADER(S): Hampton Abbot, MD, Cherlynn Perches, Ph.D, [name], RN      Cheryl Dixon is a 66 year old, Divorced, female, who presented for [facility] group visit.   Length of group: 60 min which included 30 min of Health Behavior Intervention and OUD Psychoeducation.     Number of participants in group today: 10    Group Topic Today: Anxiety as a high risk situation for opioid addiction. Participants reviewed anxiety symptoms and learned about an "anxiety avoidance cycle". Patients learned about anxiety triggers.  Patients reviewed high risk situations for relapse and immediate coping skills to manage anxiety. Patients reviewed strategies to increase healthy coping mechanisms to support recovery.     Purpose of Group:    Education about Opioid Use Disorder (OUD) and treatment   Enhancing adjustment and coping with OUD   Improving management of OUD and treatment adherence   Supporting Recovery Process   Increasing health promoting  behaviors   Decreasing health risk behaviors and risks of relapse   Addressing psychological/psychosocial/behavioral barriers to preventing addiction and relapse   Promotion of functional improvement     Group Interventions Utilized:   Psychoeducation related to the psychological, behavioral, and/ or psychosocial aspects of OUD   Motivational interviewing   Coping skills training   Cognitive restructuring   Emotional awareness and management   Operant behavior therapy and contingency management   Stimulus control   Mindfulness techniques training    Group Process:       Group rules    Review of group goals/structure   Discussed anxiety as a high risk situation for opioid addiction.    Reviewed anxiety symptoms and learned about an "anxiety avoidance cycle".    Reviewed immediate coping skills to manage anxiety.    Opioid addiction as a learned behavior discussed.   Explored relapse prevention strategies    Mindfulness and self-compassion strategies discussed.   Discussed support and helpful strategies to promote recovery   Check in      Individual Patient Participation: Pt presented by video. Pt was engaged and reported having a good week. Overall, doing well but she is not feeling well today: "I am not feeling well. I am good otherwise". No relapses/close calls reported.      Diagnosis (addressed by this group): Opioid Dependence on Agonist Therapy (Opioid Use Disorder on Agonist Therapy)     Relevant changes in mental status:   No major changes reported or observed       Plan: Pt will continue OBOT treatment     Cherlynn Perches, Ph.D.  .

## 2020-10-23 NOTE — BH OP Treatment Plan (Signed)
Patient/Guardian:  contributed to the creation of the treatment plan.    Strengths/Skills: Help seeking and motivated to get better     Potential Barriers: Multiple life stressors       Patient stated goals: Pt would like to prevent relapses and support the recovery process     Problem 1: Opioid Dependence on Agonist Therapy (Opioid Use Disorder on Agonist Therapy)      Short Term Goals: Pt will use at least one copying skill a day to reduce a risk of relapse, enhance coping with OUD, and support recovery process     Short Term Target:  60 days  Short TermTarget Date: 12/22/2020  Short Term Goal #1 Progress: progressing     Long Term Goals: Pt will report 50% in urges and cravings reduction and no relapses or close calls       Long Term Target:  90 days  Long TermTarget Date:  01/21/2021   Long Term Goal #1 Progress:  progressing      Intervention #1: PCBHI-based OBOT/Group Psychotherapy   Intervention Frequency:  biweekly  Intervention Duration:  90 Days  Intervention Responsibility:  OBOT team

## 2020-10-23 NOTE — Telephone Encounter (Signed)
Patient calling stating cant get in the group through the link

## 2020-10-23 NOTE — Progress Notes (Signed)
Cheryl Dixon is a 66 year old female seen in follow up for opioid dependence:    Buprenorphine dose: 20mg   Response, adequacy of dose: good  Relapses/close calls: none  Trigger: denies  Meetings: none currently    Social history/events:  Wasn't feeling great yesterday but went to doctor.  Doing better today.    Present Medications:  nitrofurantoin (MACROBID) 100 MG capsule, Take 1 capsule by mouth in the morning and 1 capsule before bedtime. Do all this for 5 days., Disp: 10 capsule, Rfl: 0  naproxen (NAPROSYN) 500 MG tablet, TAKE 1 TABLET BY MOUTH WITH FOOD EVERY TWELVE HOURS AS NEEDED FOR PAIN, Disp: 20 tablet, Rfl: 0  buprenorphine-naloxone (SUBOXONE) 8-2 MG sublingual film, Take two and one-half films under the tongue daily., Disp: 35 Film, Rfl: 0  estradiol (ESTRACE VAGINAL) 0.1 MG/GM vaginal cream, Place 4 g vaginally 3 (three) times a week, Disp: 127.5 g, Rfl: 1  polyethylene glycol (HEALTHYLAX) 17 g packet, TAKE 17G BY MOUTH DAILY MIX IN 64 OUNCE OF CLEAR LIQUID AND TAKE as directed, Disp: 28 packet, Rfl: 2  cholecalciferol (HM VITAMIN D3) 1000 UNIT tablet, Take 1,000 Units by mouth daily, Disp: , Rfl:   acetaminophen (TYLENOL) 500 MG tablet, Take 500 mg by mouth every 6 (six) hours as needed for Pain, Disp: , Rfl:   umeclidinium (INCRUSE ELLIPTA) 62.5 MCG/INH inhaler, INHALE 1 PUFF BY MOUTH INTO LUNGS DAILY, Disp: 1 each, Rfl: 11  buPROPion (WELLBUTRIN SR) 200 MG 12 hr tablet, Take 1 tablet by mouth 2 (two) times daily, Disp: 180 tablet, Rfl: 3  doxepin (SINEQUAN) 10 MG capsule, Take 1-2 capsules by mouth daily  for 14 days, Disp: 28 capsule, Rfl: 0  albuterol (VENTOLIN HFA) 108 (90 Base) MCG/ACT inhaler, INHALE 2 PUFFS INTO THE LUNGS EVERY 6 HOURS AS NEEDED FOR WHEEZING OR SHORTNESS OF BREATH, Disp: 8.5 g, Rfl: 11  ipratropium-albuterol (DUO-NEB) 0.5-2.5 (3) MG/3ML SOLN Inhalation Solution, Take 3 mLs by nebulization 4 (four) times daily., Disp: 180 mL, Rfl: 0    No current facility-administered medications  on file prior to visit.      PHQ-9 TOTAL SCORE 01/12/2019 06/01/2018 03/03/2017   Doc FlowSheet Total Score - - -   Doc FlowSheet Total Score 10 6 4            PHYSICAL EXAMINATION via video:  General appearance - healthy female in no distress  Neuro - nonfocal  Affect - normal  Behavior without evidence of intoxication or impairment    PMP Reviewed.  No unauthorized prescriptions since last refill.     Assessment:  Opioid Dependence  Comment: Stable recovery.  Plan:  Continue buprenorphine, reviewed criteria for tapering when patient is ready. Discussed sources of support.  Patient is counseled regarding relapse prevention, involvement in recovery groups, potential side effects of buprenorphine, and eventual taper of medication.    For the patient participating in the group via telephone and/or video technology: the patient/guardian has verbally consented to participate in this group therapy session by televisit. The patient/guardian was encouraged to be in a private location due to personal health information being discussed and where they could not be overheard by individuals not participating in the group therapy. Although all [place] staff/providers participating in this group therapy televisit are in a private location, all the risks of telephone and/or video technology used to conduct this group therapy session cannot be controlled by [place], (i.e. interruptions, unauthorized access/recording and technical difficulties).  Each patient understands that the visit can  be stopped at any time for any reason, including if the conferencing connections are inadequate.       Geni Bers, MD, 10/23/2020

## 2020-10-25 ENCOUNTER — Other Ambulatory Visit (HOSPITAL_BASED_OUTPATIENT_CLINIC_OR_DEPARTMENT_OTHER): Payer: Self-pay

## 2020-10-25 ENCOUNTER — Encounter (INDEPENDENT_AMBULATORY_CARE_PROVIDER_SITE_OTHER): Payer: Self-pay | Admitting: Bariatrics

## 2020-10-25 MED ORDER — PEG 3350-KCL-NABCB-NACL-NASULF 236 G PO SOLR
4.0000 L | ORAL | 0 refills | Status: DC
Start: 2020-10-25 — End: 2021-10-10

## 2020-10-25 MED ORDER — SIMETHICONE 80 MG PO TABS
4.0000 | ORAL_TABLET | ORAL | 0 refills | Status: DC
Start: 2020-10-25 — End: 2021-10-10

## 2020-10-25 NOTE — Telephone Encounter (Signed)
Called and spoke with patient to schedule a colonoscopy.  Procedure/preparation and need for ride explained.  Medical history reviewed.  Patient scheduled on 7/28. Golytely prep reviewed and sent.  Patient aware that [facility] requires a COVID test prior to the procedure and that someone will reach out to help coordinate.  Colonoscopy Instructions  Standard Preparation    7 DAYS BEFORE THE TEST         DO NOT TAKE: Nonsteroidal anti-inflammatory medications, these include Ibuprofen, Motrin, Advil, Naproxen, Aleve, Naprosyn, Meloxicam, and many others   You may take Tylenol (which is acetaminophen) for pain   Some medical conditions require you to stay on Aspirin. Do not stop Aspirin until you speak with your health care provider.   3 DAYS BEFORE THE TEST       Stop eating high fiber foods such as corn, beans, seeds, nuts, whole grain breads, or fruit skins (pear, apple, etc.)    2 DAYS BEFORE THE TEST   Have a light dinner no later than 7 PM    1 DAY BEFORE THE TEST   Begin a clear liquid diet - a clear liquid means you can see through it. No solid food for the entire day    Clear Liquids Not Clear Liquids   Water, Gatorade, Powerade, or Pedialyte No red or purple items   Black coffee or tea (no milk, cream) No alcohol   Clear broth No milk, cream, other dairy products   Ginger ale or Sprite No noodles, rice, or vegetables in soup   Apple juice No juice with pulp   Jell-o, popsicles  No liquid you cannot see through      Prepare the laxative: mix the laxative powder with water, then put it in the refrigerator   Starting at 6 PM, drink one glass (8 ounces) of laxative every 30 minutes until half the bottle is empty. Be sure to stay close to a bathroom once you start the prep.    At 9 PM, take 2 gas pills (simethicone) with 1 glass (8 ounces) of clear liquid   At 10 PM, take 2 more gas pills (simethicone) with 1 glass (8 ounces) of clear liquid    DAY OF THE COLONOSCOPY   4 to 5 hours before your test, drink the  remaining half of the bottle of laxative. It is very important to finish the whole gallon. This will empty your colon to complete the test without problems.   The morning of the test, you can take your other medications at the usual time with only a sip of water. These include blood pressure pills, seizure medications, heart medications, thyroid medications, etc.   Do not take pills for diabetes. If you have diabetes, please follow the colonoscopy preparation for people with diabetes handout   Do not drink anything for 3 hours before the test

## 2020-10-25 NOTE — Progress Notes (Signed)
Chief Complaint:   OBESITY Michelle Roberts is here to discuss her progress with her obesity treatment plan along with follow-up of her obesity related diagnoses. Michelle Roberts is on the [employer] + 100 calories and states she is following her eating plan approximately 30% of the time. Michelle Roberts states she is walking for 15 minutes 1-2 times per week.  Today's visit was #: 3 Starting weight: 202 lbs Starting date: 06/22/2020 Today's weight: 191 lbs Today's date: 10/22/2020 Total lbs lost to date: 11 Total lbs lost since last in-office visit: 2  Interim History: Michelle Roberts is down 2 lbs today from her last visit. She did have a vacation in the interim. .  Subjective:   1. Essential hypertension Michelle Roberts's blood pressure is reasonably well controlled.  2. Other hyperlipidemia Michelle Roberts is currently taking Lipitor.  Assessment/Plan:   1. Essential hypertension Cloyce will continue her medications, and will continue working on healthy weight loss and exercise to improve blood pressure control. We will watch for signs of hypotension as she continues her lifestyle modifications.  2. Other hyperlipidemia Cardiovascular risk and specific lipid/LDL goals reviewed.  We discussed several lifestyle modifications today. Michelle Roberts will continue her medications, and will continue to work on diet, exercise and weight loss efforts. Orders and follow up as documented in patient record.   Counseling Intensive lifestyle modifications are the first line treatment for this issue. Dietary changes: Increase soluble fiber. Decrease simple carbohydrates. Exercise changes: Moderate to vigorous-intensity aerobic activity 150 minutes per week if tolerated. Lipid-lowering medications: see documented in medical record.  3. Obesity witrh current BMI 40 Michelle Roberts is currently in the action stage of change. As such, her goal is to continue with weight loss efforts. She has agreed to the [employer].   Intentional eating was discussed.  Michelle Roberts will adhere more closely to the meal plan. She is to decrease skipping meals. I reviewed labs from 09/19/2020 with the patient today.  Exercise goals: As is.  Behavioral modification strategies: increasing lean protein intake, decreasing simple carbohydrates, increasing vegetables, increasing water intake, decreasing eating out, no skipping meals, meal planning and cooking strategies, keeping healthy foods in the home, and planning for success.  Michelle Roberts has agreed to follow-up with our clinic in 2 to 3 weeks. She was informed of the importance of frequent follow-up visits to maximize her success with intensive lifestyle modifications for her multiple health conditions.   Objective:   Blood pressure 138/82, pulse (!) 103, temperature 98.6 F (37 C), height 4\' 10"  (1.473 m), weight 191 lb (86.6 kg), SpO2 99 %. Body mass index is 39.92 kg/m.  General: Cooperative, alert, well developed, in no acute distress. HEENT: Conjunctivae and lids unremarkable. Cardiovascular: Regular rhythm.  Lungs: Normal work of breathing. Neurologic: No focal deficits.   Lab Results  Component Value Date   CREATININE 0.83 09/19/2020   BUN 14 09/19/2020   NA 140 09/19/2020   K 4.3 09/19/2020   CL 95 (L) 09/19/2020   CO2 24 09/19/2020   Lab Results  Component Value Date   ALT 23 09/19/2020   AST 22 09/19/2020   ALKPHOS 85 09/19/2020   BILITOT 0.5 09/19/2020   Lab Results  Component Value Date   HGBA1C 6.7 (H) 09/19/2020   Lab Results  Component Value Date   INSULIN 9.3 09/19/2020   Lab Results  Component Value Date   TSH 0.141 (L) 09/19/2020   Lab Results  Component Value Date   CHOL 232 (H) 09/19/2020   HDL 95  09/19/2020   LDLCALC 123 (H) 09/19/2020   TRIG 85 09/19/2020   Lab Results  Component Value Date   VD25OH 56.1 09/19/2020   VD25OH 46.7 05/01/2015   Lab Results  Component Value Date   WBC 10.6 09/19/2020   HGB 13.7 09/19/2020   HCT 43.1 09/19/2020   MCV 80 09/19/2020    PLT 248 01/08/2016   No results found for: IRON, TIBC, FERRITIN  Attestation Statements:   Reviewed by clinician on day of visit: allergies, medications, problem list, medical history, surgical history, family history, social history, and previous encounter notes.   Wilhemena Durie, am acting as Location manager for [employer], DO.  I have reviewed the above documentation for accuracy and completeness, and I agree with the above. Jearld Lesch, DO

## 2020-10-26 ENCOUNTER — Other Ambulatory Visit (HOSPITAL_BASED_OUTPATIENT_CLINIC_OR_DEPARTMENT_OTHER): Payer: Self-pay | Admitting: Physician Assistant

## 2020-10-26 DIAGNOSIS — Z01812 Encounter for preprocedural laboratory examination: Secondary | ICD-10-CM

## 2020-11-06 ENCOUNTER — Ambulatory Visit: Payer: 59 | Admitting: Family Medicine

## 2020-11-06 DIAGNOSIS — F1120 Opioid dependence, uncomplicated: Secondary | ICD-10-CM

## 2020-11-06 MED ORDER — BUPRENORPHINE HCL-NALOXONE HCL 8-2 MG SL FILM
ORAL_FILM | SUBLINGUAL | 0 refills | Status: DC
Start: 2020-11-06 — End: 2020-11-20

## 2020-11-06 NOTE — Progress Notes (Signed)
Joined early in group but wasn't feeling well.  Due for refill.

## 2020-11-07 ENCOUNTER — Telehealth (HOSPITAL_BASED_OUTPATIENT_CLINIC_OR_DEPARTMENT_OTHER): Payer: Self-pay

## 2020-11-07 NOTE — Telephone Encounter (Signed)
I called the patient and Left a voicemail to return call    If patient calls back, please: Book the appointment per the following guidance: SCHEDULE LAB FOR UDS

## 2020-11-08 ENCOUNTER — Encounter (HOSPITAL_BASED_OUTPATIENT_CLINIC_OR_DEPARTMENT_OTHER): Payer: Self-pay

## 2020-11-08 NOTE — Progress Notes (Signed)
Left message in patient's voicemail. Confirmed patient's procedure for 11/22/20. Arrival time at 1045. Location [address] [place], [place]. Ride home requirement reviewed. Asked the patient to call [phone] with any questions about their procedure.    Testing requirement reviewed. Covid test scheduled for 11/20/20 at 0905 in [place].    Colonoscopy (SD1) procedure and Covid testing information mailed to address on file.

## 2020-11-08 NOTE — Progress Notes (Signed)
Patient called back to confirm procedure for 11/22/20.  Answered any questions patient had about their procedure.

## 2020-11-14 ENCOUNTER — Telehealth (HOSPITAL_BASED_OUTPATIENT_CLINIC_OR_DEPARTMENT_OTHER): Payer: Self-pay | Admitting: Registered Nurse

## 2020-11-14 ENCOUNTER — Other Ambulatory Visit: Payer: Self-pay

## 2020-11-14 ENCOUNTER — Ambulatory Visit (INDEPENDENT_AMBULATORY_CARE_PROVIDER_SITE_OTHER): Payer: 59 | Admitting: Family Medicine

## 2020-11-14 ENCOUNTER — Encounter (INDEPENDENT_AMBULATORY_CARE_PROVIDER_SITE_OTHER): Payer: Self-pay | Admitting: Family Medicine

## 2020-11-14 VITALS — BP 140/82 | HR 92 | Temp 98.0°F | Ht <= 58 in | Wt 188.0 lb

## 2020-11-14 DIAGNOSIS — I10 Essential (primary) hypertension: Secondary | ICD-10-CM | POA: Diagnosis not present

## 2020-11-14 DIAGNOSIS — Z6841 Body Mass Index (BMI) 40.0 and over, adult: Secondary | ICD-10-CM | POA: Diagnosis not present

## 2020-11-14 NOTE — Telephone Encounter (Signed)
Called and spoke with patient to confirm colonoscopy with Dr. Deneise Lever on 11/22/20     Reviewed Colonoscopy prep instructions, patient verbalized understanding.    Patient understands the need for ride after the procedure.     Advised to call the clinic for any questions or concerns.

## 2020-11-20 ENCOUNTER — Ambulatory Visit (HOSPITAL_BASED_OUTPATIENT_CLINIC_OR_DEPARTMENT_OTHER): Admission: RE | Admit: 2020-11-20 | Payer: 59 | Source: Ambulatory Visit

## 2020-11-20 ENCOUNTER — Ambulatory Visit: Payer: 59 | Attending: Family Medicine | Admitting: Family Medicine

## 2020-11-20 DIAGNOSIS — F1120 Opioid dependence, uncomplicated: Secondary | ICD-10-CM | POA: Diagnosis present

## 2020-11-20 DIAGNOSIS — R1032 Left lower quadrant pain: Secondary | ICD-10-CM | POA: Insufficient documentation

## 2020-11-20 DIAGNOSIS — Z6832 Body mass index (BMI) 32.0-32.9, adult: Secondary | ICD-10-CM | POA: Insufficient documentation

## 2020-11-20 DIAGNOSIS — R300 Dysuria: Secondary | ICD-10-CM | POA: Insufficient documentation

## 2020-11-20 DIAGNOSIS — E66812 Obesity, class 2: Secondary | ICD-10-CM | POA: Insufficient documentation

## 2020-11-20 MED ORDER — BUPRENORPHINE HCL-NALOXONE HCL 8-2 MG SL FILM
ORAL_FILM | SUBLINGUAL | 0 refills | Status: DC
Start: 2020-11-20 — End: 2020-12-04

## 2020-11-20 NOTE — Progress Notes (Signed)
Cheryl Dixon is a 65 year old female seen in follow up for opioid dependence:    Buprenorphine dose: '20mg'$   Response, adequacy of dose: good  Relapses/close calls: none  Trigger: denies  Meetings: none currently    Social history/events:  She was present during the visit but then was unable to share during check in.    Present Medications:  buprenorphine-naloxone (SUBOXONE) 8-2 MG sublingual film, Take two and one-half films under the tongue daily., Disp: 35 Film, Rfl: 0  polyethylene glycol (GOLYTELY) 236 g suspension, Take 4,000 mLs by mouth See Admin Instructions Take as directed prior to your colonoscopy, Disp: 4000 mL, Rfl: 0  Simethicone 80 MG TABS, Take 4 tablets by mouth See Admin Instructions Take as directed prior to colonoscopy., Disp: 4 tablet, Rfl: 0  naproxen (NAPROSYN) 500 MG tablet, TAKE 1 TABLET BY MOUTH WITH FOOD EVERY TWELVE HOURS AS NEEDED FOR PAIN, Disp: 20 tablet, Rfl: 0  estradiol (ESTRACE VAGINAL) 0.1 MG/GM vaginal cream, Place 4 g vaginally 3 (three) times a week, Disp: 127.5 g, Rfl: 1  polyethylene glycol (HEALTHYLAX) 17 g packet, TAKE 17G BY MOUTH DAILY MIX IN 64 OUNCE OF CLEAR LIQUID AND TAKE as directed, Disp: 28 packet, Rfl: 2  cholecalciferol (HM VITAMIN D3) 1000 UNIT tablet, Take 1,000 Units by mouth daily, Disp: , Rfl:   acetaminophen (TYLENOL) 500 MG tablet, Take 500 mg by mouth every 6 (six) hours as needed for Pain, Disp: , Rfl:   umeclidinium (INCRUSE ELLIPTA) 62.5 MCG/INH inhaler, INHALE 1 PUFF BY MOUTH INTO LUNGS DAILY, Disp: 1 each, Rfl: 11  buPROPion (WELLBUTRIN SR) 200 MG 12 hr tablet, Take 1 tablet by mouth 2 (two) times daily, Disp: 180 tablet, Rfl: 3  doxepin (SINEQUAN) 10 MG capsule, Take 1-2 capsules by mouth daily  for 14 days, Disp: 28 capsule, Rfl: 0  albuterol (VENTOLIN HFA) 108 (90 Base) MCG/ACT inhaler, INHALE 2 PUFFS INTO THE LUNGS EVERY 6 HOURS AS NEEDED FOR WHEEZING OR SHORTNESS OF BREATH, Disp: 8.5 g, Rfl: 11  ipratropium-albuterol (DUO-NEB) 0.5-2.5 (3) MG/3ML  SOLN Inhalation Solution, Take 3 mLs by nebulization 4 (four) times daily., Disp: 180 mL, Rfl: 0    No current facility-administered medications on file prior to visit.      PHQ-9 TOTAL SCORE 01/12/2019 06/01/2018 03/03/2017   Doc FlowSheet Total Score - - -   Doc FlowSheet Total Score '10 6 4           '$ PHYSICAL EXAMINATION via video:  General appearance - healthy female in no distress  Neuro - nonfocal  Affect - normal  Behavior without evidence of intoxication or impairment    PMP Reviewed.  No unauthorized prescriptions since last refill.     Assessment:  Opioid Dependence  Comment: Will continue with current plan.  Can reach out to [employer] with concerns in the meantime.  Plan:  Continue buprenorphine, reviewed criteria for tapering when patient is ready. Discussed sources of support.  Patient is counseled regarding relapse prevention, involvement in recovery groups, potential side effects of buprenorphine, and eventual taper of medication.    For the patient participating in the group via telephone and/or video technology: the patient/guardian has verbally consented to participate in this group therapy session by televisit. The patient/guardian was encouraged to be in a private location due to personal health information being discussed and where they could not be overheard by individuals not participating in the group therapy. Although all [place] staff/providers participating in this group therapy televisit are in  a private location, all the risks of telephone and/or video technology used to conduct this group therapy session cannot be controlled by [facility], (i.e. interruptions, unauthorized access/recording and technical difficulties).  Each patient understands that the visit can be stopped at any time for any reason, including if the conferencing connections are inadequate.       Geni Bers, MD, 11/20/2020

## 2020-11-21 ENCOUNTER — Encounter (HOSPITAL_BASED_OUTPATIENT_CLINIC_OR_DEPARTMENT_OTHER): Payer: Self-pay

## 2020-11-21 NOTE — Progress Notes (Signed)
Called patient to confirm she was having her procedure for 11/22/20 arrival 1045am. Patient states she had the dates confused and thought her covid test was Wednesday 11/21/20 with a Friday 11/23/20 procedure, patients states "I am going to cancel this and call back when I'm ready" this RN explained the covid testing requirements and colonoscopy preparation instructions for when she reschedules. Procedure cancelled per patient request.

## 2020-11-22 ENCOUNTER — Other Ambulatory Visit (HOSPITAL_BASED_OUTPATIENT_CLINIC_OR_DEPARTMENT_OTHER): Payer: 59

## 2020-11-22 NOTE — Progress Notes (Signed)
Chief Complaint:   OBESITY Sheleen is here to discuss her progress with her obesity treatment plan along with follow-up of her obesity related diagnoses. Juliona is on the [employer] and states she is following her eating plan approximately 60% of the time. Myrtis states she is doing 0 minutes 0 times per week.  Today's visit was #: 4 Starting weight: 202 lbs Starting date: 06/22/2020 Today's weight: 188 lbs Today's date: 11/14/2020 Total lbs lost to date: 14 Total lbs lost since last in-office visit: 3  Interim History: Monesha continues to do well with weight loss. She is trying to follow her Pescatarian plan, and she is making some substitutions, but she is trying to make healthier options.  Subjective:   1. Essential hypertension Sahniya is still taking her medications and doing well with diet and exercise. She denies chest pain or lightheadedness.  Assessment/Plan:   1. Essential hypertension Joesphine will continue her medications, diet, and weight loss to improve blood pressure control. We will watch for signs of hypotension as she continues her lifestyle modifications.  2. Obesity with current BMI 39.4 Sharalyn is currently in the action stage of change. As such, her goal is to continue with weight loss efforts. She has agreed to the [employer].   Behavioral modification strategies: increasing lean protein intake.  Mahala has agreed to follow-up with our clinic in 3 to 4 weeks. She was informed of the importance of frequent follow-up visits to maximize her success with intensive lifestyle modifications for her multiple health conditions.   Objective:   Blood pressure 140/82, pulse 92, temperature 98 F (36.7 C), height '4\' 10"'$  (1.473 m), weight 188 lb (85.3 kg), SpO2 100 %. Body mass index is 39.29 kg/m.  General: Cooperative, alert, well developed, in no acute distress. HEENT: Conjunctivae and lids unremarkable. Cardiovascular: Regular rhythm.  Lungs: Normal work of  breathing. Neurologic: No focal deficits.   Lab Results  Component Value Date   CREATININE 0.83 09/19/2020   BUN 14 09/19/2020   NA 140 09/19/2020   K 4.3 09/19/2020   CL 95 (L) 09/19/2020   CO2 24 09/19/2020   Lab Results  Component Value Date   ALT 23 09/19/2020   AST 22 09/19/2020   ALKPHOS 85 09/19/2020   BILITOT 0.5 09/19/2020   Lab Results  Component Value Date   HGBA1C 6.7 (H) 09/19/2020   Lab Results  Component Value Date   INSULIN 9.3 09/19/2020   Lab Results  Component Value Date   TSH 0.141 (L) 09/19/2020   Lab Results  Component Value Date   CHOL 232 (H) 09/19/2020   HDL 95 09/19/2020   LDLCALC 123 (H) 09/19/2020   TRIG 85 09/19/2020   Lab Results  Component Value Date   VD25OH 56.1 09/19/2020   VD25OH 46.7 05/01/2015   Lab Results  Component Value Date   WBC 10.6 09/19/2020   HGB 13.7 09/19/2020   HCT 43.1 09/19/2020   MCV 80 09/19/2020   PLT 248 01/08/2016   No results found for: IRON, TIBC, FERRITIN  Attestation Statements:   Reviewed by clinician on day of visit: allergies, medications, problem list, medical history, surgical history, family history, social history, and previous encounter notes.  Time spent on visit including pre-visit chart review and post-visit care and charting was 32 minutes.    I, Trixie Dredge, am acting as transcriptionist for Dennard Nip, MD.  I have reviewed the above documentation for accuracy and completeness, and I agree with  the above. -  Dennard Nip, MD

## 2020-11-27 ENCOUNTER — Ambulatory Visit (HOSPITAL_BASED_OUTPATIENT_CLINIC_OR_DEPARTMENT_OTHER): Payer: Self-pay

## 2020-11-27 NOTE — Telephone Encounter (Addendum)
Patient identification verified by 2 forms.    Call placed to patient reports she is exp pain to lower left quadrant comes and goes for past 2 years.   Gets worse every time it comes.   Does have pain restarting now.   Denied any dysuria, hematuria and or frequency.   No vaginal bleeding, no cramping.   Some discomfort to left back.   Denied any fever/chills.   LBM yesterday was normal for patient.   Is scheduled for colonoscopy 12/06/2020.  No appts available today.   Offered televisit, patient accepted scheduled appt with Dr Sara Chu 11/30/2020 @ 8 AM.   Aware if she exp worsening pain and or change in sx to recall office and or present to Cheryl.   Patient verbalized understanding and agreed with plan.         Regarding: abdominal pain  ----- Message from Denton Meek, [place] sent at 11/27/2020  3:05 PM EDT -----  Cheryl Dixon [id number], 66 year old, female    Calls today:  Sick    What are the symptoms Patient calling with lower left sided abdominal pain that comes and goes. No urinary symptoms. Would like nurse to call her back.  How long has patient been sick? Yesterday  What has pt. tried at home Drinking cranberry juice  Person calling on behalf of patient: Patient (self)    CALL BACK NUMBER: [phone]  Best time to call back: ASAP  Cell phone:   Other phone:    Patient's language of care: English    Patient does not need an interpreter.    Patient's PCP: [name], MD

## 2020-11-29 ENCOUNTER — Ambulatory Visit (HOSPITAL_BASED_OUTPATIENT_CLINIC_OR_DEPARTMENT_OTHER): Payer: Self-pay

## 2020-11-29 NOTE — Telephone Encounter (Addendum)
Patient identification verified by 2 forms.    Call placed to patient reports her abdominal pain persist.   No real change from our conversation yesterday.   She is requesting an antibiotic.   Advised will need eval due to her sx.   No current appts at this time.   Advised to present to Cheryl for eval.   Patient verbalized understanding and agreed with plan.         Regarding: abd pain -severe  ----- Message from [facility] sent at 11/29/2020  3:34 PM EDT -----  Cheryl Dixon [id number], 65 year old, female    Calls today:  Sick    What are the symptoms lower abd pain on left side - severe. Patient has 8/5 televisit scheduled, states can't wait till then.   How long has patient been sick? n/a  What has pt. tried at home n/a  Person calling on behalf of patient: Patient (self)    Rod [place] NUMBER: [phone]      Patient's language of care: English    Patient does not need an interpreter.    Patient's PCP: [name], MD

## 2020-11-30 ENCOUNTER — Ambulatory Visit: Payer: 59 | Attending: Internal Medicine | Admitting: Internal Medicine

## 2020-11-30 ENCOUNTER — Telehealth (HOSPITAL_BASED_OUTPATIENT_CLINIC_OR_DEPARTMENT_OTHER): Payer: Self-pay | Admitting: Internal Medicine

## 2020-11-30 DIAGNOSIS — D126 Benign neoplasm of colon, unspecified: Secondary | ICD-10-CM | POA: Diagnosis present

## 2020-11-30 DIAGNOSIS — R1032 Left lower quadrant pain: Secondary | ICD-10-CM | POA: Insufficient documentation

## 2020-11-30 NOTE — Telephone Encounter (Signed)
I called the patient to offer the following post-televisit services.    I was able to reach the patient and assisted them with the needs listed below.    Please copy and paste the provider's request below:    Please advise   ----- Message -----   From: Alinda Deem, MD   Sent: 11/30/2020  8:22 AM EDT   To: Kersey   [id number]   [place] [phone]     Please assist this patient with the following:     Schedule in-person visit for colnoscopy follow up/HM, time frame: July 18/19/20, with roll or arleigh if possible, if appointment not available: Schedule per patient preference          Appt scheduled based on pt preference

## 2020-11-30 NOTE — Progress Notes (Signed)
televisit for abdominal pain  Patient was unable  To connect to video    Notes Cheryl Dixon has been going on for a while, and she has been in numerous times for this  Has had numerous organs removed  Feels the suboxone may be masking her pain. Now suboxone wears off mid day and she feels the pain  At some point thought it was related to UTIs. Drinks cranberry juice. Drinks tons of water. But can't urinate all day then urinates in the evening/night  Burning with urine if she drinks OJ or a few other things, not enough water, but she thinks its the vaginal atrophy.   4 urine cultures negative - 2013, 2015, 2020, 2022    Estrogen replacement due to ovaries out  Has had hernias in past, one of the mesh is broken but it doesn't bother her    Strenuous exercise, not having BM every day in the morning - trigger it  If she drinks too much water and she can't pee, that's worse too  Not every day, "feels like an attack" - may last 1-2 days, may last hours, some all she has to do is go home, relax, put ice on it      Notes she is set up for the colonoscopy - was unable to get to     To poop:  Increased fiber - gave her cramps - so she stpoped  Takes healthylax - sometimes two packs  She no longer takes the prune juice but notes she used to and it helped           Patient Active Problem List:     Opioid dependence on agonist therapy ([place])     Tobacco use disorder     Fibrocystic breast     Elevated BP without diagnosis of hypertension     Knee pain     Chronic low back pain     OA (osteoarthritis) of knee     H. pylori infection     Major depressive disorder, recurrent episode, severe (HCC)     Occult blood in stools     Prediabetes     Epigastric pain     Chronic obstructive pulmonary disease (HCC)     Left foot pain     Adenomatous polyp of colon     Acquired deformity of toe, right     Dislocation of metatarsophalangeal joint of lesser toe, right, sequela     Postmenopausal atrophic vaginitis     Abnormal loss of weight     Thoracic  aortic aneurysm without rupture ([place])     COVID-19 virus infection     BMI 32.0-32.9,adult     Dysuria     LLQ pain    LMP 11/20/1991   Pain Score: Data Unavailable    Telephone visit - no vitals available    GEN: pleasant, comfortable  PULM: speaking in full sentences, no coughing  Neuro: alert and oriented  PSYCH: nl affect, seemingly normal thought process and speech patterns      A/P  (R10.32) LLQ pain  (primary encounter diagnosis)  (D12.6) Adenomatous polyp of colon, unspecified part of colon  We discussed her history and reviewed her chart, results, prior colonoscopy  discussed this sounds less like UTI, more likely something GI or abd wall  Could be hernia - she says she has been examined , so this is less likely  Could be colon related - diverticulosis for example  She will add prune juice  Keep track of episodes  Follow up after colonoscopy in primary care to review results, and has some HM topics she is oerdue for    Message to [place] to call pt for appt

## 2020-12-03 ENCOUNTER — Telehealth (HOSPITAL_BASED_OUTPATIENT_CLINIC_OR_DEPARTMENT_OTHER): Payer: Self-pay

## 2020-12-03 ENCOUNTER — Other Ambulatory Visit (HOSPITAL_BASED_OUTPATIENT_CLINIC_OR_DEPARTMENT_OTHER): Payer: Self-pay

## 2020-12-03 MED ORDER — PEG 3350-KCL-NABCB-NACL-NASULF 236 G PO SOLR
4.00 L | Freq: Once | ORAL | 0 refills | Status: AC
Start: 2020-12-03 — End: 2020-12-03

## 2020-12-03 NOTE — Telephone Encounter (Signed)
PT-1 request is submitted  PT-1 Request [phone] isPending. For pending submissions, please check the portal periodically for updates.  An email notification has been sent to the provider on behalf of whom the PT-1 request was submitted. To update the email address, please visit your profile page.        Below are the details of your pending request.       Trip Summary       Member Home:  [id number] [place] Location:  [place]   Treatment Details    Treating facility within member's locality No   Medical treatment type: [id number] - Factors influencing health status and contact with health services   Duration: 12 Month(s)   Frequency: 6 visit(s) per Month   Transportation Details    Member will require a wheelchair van No   Member will be accompanied by an escort Yes   Member will require a service animal No

## 2020-12-04 ENCOUNTER — Other Ambulatory Visit (HOSPITAL_BASED_OUTPATIENT_CLINIC_OR_DEPARTMENT_OTHER): Payer: Self-pay | Admitting: Family Medicine

## 2020-12-04 ENCOUNTER — Ambulatory Visit (HOSPITAL_BASED_OUTPATIENT_CLINIC_OR_DEPARTMENT_OTHER): Payer: 59 | Admitting: Family Medicine

## 2020-12-04 ENCOUNTER — Ambulatory Visit
Admission: RE | Admit: 2020-12-04 | Discharge: 2020-12-04 | Disposition: A | Discharge status: Home / Self Care | Payer: 59 | Source: Ambulatory Visit | Attending: Internal Medicine | Admitting: Internal Medicine

## 2020-12-04 DIAGNOSIS — F1120 Opioid dependence, uncomplicated: Secondary | ICD-10-CM

## 2020-12-04 DIAGNOSIS — Z20822 Contact with and (suspected) exposure to covid-19: Secondary | ICD-10-CM | POA: Diagnosis present

## 2020-12-04 LAB — COVID-19 OUTPATIENT: COVID-19 OUTPATIENT: NEGATIVE

## 2020-12-04 MED ORDER — BUPRENORPHINE HCL-NALOXONE HCL 8-2 MG SL FILM
ORAL_FILM | SUBLINGUAL | 0 refills | Status: DC
Start: 2020-12-04 — End: 2020-12-18

## 2020-12-04 NOTE — Telephone Encounter (Signed)
Unable to attend group due to medical appt but contacted [name] ahead of time.

## 2020-12-05 ENCOUNTER — Telehealth (HOSPITAL_BASED_OUTPATIENT_CLINIC_OR_DEPARTMENT_OTHER): Payer: Self-pay

## 2020-12-05 ENCOUNTER — Encounter (HOSPITAL_BASED_OUTPATIENT_CLINIC_OR_DEPARTMENT_OTHER): Payer: Self-pay

## 2020-12-05 NOTE — Progress Notes (Signed)
Confirmed procedure for Thu. 8/11. Negative Covid test on 8/9 in [place] .  Aware of liquid diet and prep for today. Offered earlier appt time due to an opening,  Patient declined ,already has ride scheduled for previous time. Patient also has   Ride pick up set up.  CMA

## 2020-12-05 NOTE — Telephone Encounter (Signed)
COVID-19 RESULT CALL    Called patient to discuss COVID-19 result.    Result: NEGATIVE.    Patient answered phone? Yes, spoke with patient and informed her of Negative Covid test result. Patient verbalized understanding.    Audie Box, [place], 12/05/2020  10:25 AM

## 2020-12-06 ENCOUNTER — Other Ambulatory Visit: Payer: Self-pay

## 2020-12-06 ENCOUNTER — Ambulatory Visit
Admission: RE | Admit: 2020-12-06 | Discharge: 2020-12-06 | Disposition: A | Discharge status: Home / Self Care | Payer: 59 | Attending: Gastroenterology | Admitting: Gastroenterology

## 2020-12-06 ENCOUNTER — Encounter (HOSPITAL_BASED_OUTPATIENT_CLINIC_OR_DEPARTMENT_OTHER): Payer: Self-pay | Admitting: Anesthesiology

## 2020-12-06 ENCOUNTER — Ambulatory Visit (HOSPITAL_BASED_OUTPATIENT_CLINIC_OR_DEPARTMENT_OTHER): Payer: Self-pay | Admitting: Anesthesiology

## 2020-12-06 ENCOUNTER — Encounter (HOSPITAL_BASED_OUTPATIENT_CLINIC_OR_DEPARTMENT_OTHER): Payer: Self-pay

## 2020-12-06 DIAGNOSIS — D120 Benign neoplasm of cecum: Secondary | ICD-10-CM | POA: Diagnosis not present

## 2020-12-06 DIAGNOSIS — D122 Benign neoplasm of ascending colon: Secondary | ICD-10-CM | POA: Diagnosis present

## 2020-12-06 DIAGNOSIS — D126 Benign neoplasm of colon, unspecified: Secondary | ICD-10-CM

## 2020-12-06 DIAGNOSIS — J449 Chronic obstructive pulmonary disease, unspecified: Secondary | ICD-10-CM | POA: Diagnosis not present

## 2020-12-06 DIAGNOSIS — D123 Benign neoplasm of transverse colon: Secondary | ICD-10-CM | POA: Diagnosis present

## 2020-12-06 DIAGNOSIS — D128 Benign neoplasm of rectum: Secondary | ICD-10-CM | POA: Diagnosis not present

## 2020-12-06 DIAGNOSIS — Z86010 Personal history of colonic polyps: Secondary | ICD-10-CM

## 2020-12-06 DIAGNOSIS — D124 Benign neoplasm of descending colon: Secondary | ICD-10-CM | POA: Diagnosis not present

## 2020-12-06 DIAGNOSIS — Z1211 Encounter for screening for malignant neoplasm of colon: Secondary | ICD-10-CM | POA: Diagnosis not present

## 2020-12-06 DIAGNOSIS — D125 Benign neoplasm of sigmoid colon: Secondary | ICD-10-CM | POA: Diagnosis present

## 2020-12-06 MED ORDER — PROPOFOL INFUSION
INTRAVENOUS | Status: AC
Start: 2020-12-06 — End: 2020-12-06
  Filled 2020-12-06: qty 50

## 2020-12-06 MED ORDER — PROPOFOL 200 MG/20ML IV EMUL
INTRAVENOUS | Status: AC
Start: 2020-12-06 — End: 2020-12-06
  Filled 2020-12-06: qty 20

## 2020-12-06 MED ORDER — LACTATED RINGERS IV SOLN
INTRAVENOUS | Status: DC
Start: 2020-12-06 — End: 2020-12-07
  Administered 2020-12-06: 600 mL via INTRAVENOUS

## 2020-12-06 MED ORDER — PROPOFOL 200 MG/20 ML IV - AN
Freq: Once | INTRAVENOUS | Status: DC | PRN
Start: 2020-12-06 — End: 2020-12-06
  Administered 2020-12-06: 90 ug/kg/min via INTRAVENOUS
  Administered 2020-12-06: 90 mg via INTRAVENOUS
  Administered 2020-12-06: 100 ug/kg/min via INTRAVENOUS
  Administered 2020-12-06: 140 ug/kg/min via INTRAVENOUS
  Administered 2020-12-06: 90 mg via INTRAVENOUS

## 2020-12-06 MED ORDER — GLUCAGON HCL RDNA (DIAGNOSTIC) 1 MG IJ SOLR
INTRAMUSCULAR | Status: DC
Start: 2020-12-06 — End: 2020-12-07
  Filled 2020-12-06: qty 1

## 2020-12-06 NOTE — Anesthesia Postprocedure Evaluation (Signed)
Anesthesia Post-Operative Evaluation Note    Patient: Cheryl Dixon           Procedure Summary     Date: 12/06/20 Room / Location: [facility] - Gastroenterology    Anesthesia Start: 5188 Anesthesia Stop: 1604    Procedure: COLONOSCOPY Diagnosis:       Adenomatous polyp of colon, unspecified part of colon      Encounter for screening colonoscopy    Scheduled Providers: Izora Gala, MD; Gwenette Greet, MD Responsible Provider: Gwenette Greet, MD    Anesthesia Type: MAC ASA Status: 3            POST-OPERATIVE EVALUATION    Anesthesia Post Evaluation    Vitals signs in patient's normal range: Yes  Respiratory function stable; airway patent: Yes  Cardiovascular function stable: Yes  Hydration status stable: Yes  Mental status recovered; patient participates in evaluation and/or is at baseline: Yes  Pain control satisfactory: Yes  Nausea and vomiting control satisfactory: Yes    Procedure was labor & delivery no  PostOP disposition PACU  Anesthesia Observation no significant observation      Last vitals    BP: 107/63 (12/06/2020  4:06 PM)  Temp: 97.8 F (36.6 C) (12/06/2020  4:06 PM)  Pulse: 59 (12/06/2020  4:06 PM)  Resp: 18 (12/06/2020  4:06 PM)  SpO2: 97 % (12/06/2020  4:06 PM)

## 2020-12-06 NOTE — PROVATION-GI (Signed)
[facility]  Patient Name: Cheryl Dixon  MRN: [id number]  CSN: [id number]  Date of Birth: 17-May-1955  Admit Type: Outpatient  Age: 65  Gender: Female  Note Status: Finalized  Patient Location: [name]  Referring MD:          Vincent Peyer MD, MD  Procedure Date:        12/06/2020 2:30:00 PM  Procedure:             Colonoscopy  Endoscopist:           Izora Gala MD, MD  Indications for Procedure:       High risk colon cancer surveillance: Personal history of colonic polyps  Medications:           Monitored Anesthesia Care  Procedure:       Just prior to the procedure, an updated history and physical was done. I        obtained an informed consent from the patient reviewing the risk of the        procedure including (but not limited to) respiratory depression, perforation,        bleeding, discomfort, a possible need for surgery and unexpected reactions to        medications. The patient is aware that test has limitations and may not        detect significant lesions such as cancer or other potential diseases. The        patient was also informed that they might need a repeat colonoscopy earlier        than standard guidelines if there are changes in their symptoms or concerning        findings noted. A time out was performed with the entire procedure staff        present. The scope was passed under direct vision. Throughout the procedure,        the patient's blood pressure, pulse, and oxygen saturations were monitored        continuously. The Colonoscope was introduced through the anus and advanced to        the cecum, identified by appendiceal orifice and ileocecal valve. The scope        was then slowly withdrawn with confirmation of the noted findings. The        colonoscopy was performed without difficulty. The patient tolerated the        procedure well. The quality of the bowel preparation was good.  Findings:       The perianal and digital rectal examinations were normal.       A less than 5 mm  polyp was found in the ascending colon. The polyp was        sessile. The polyp was removed with a cold snare. Resection and retrieval        were complete.       An 18 mm polyp was found in the cecum. The polyp was flat. Area was        successfully injected with 4 mL Eleview for a lift polypectomy. The polyp was        removed with a piecemeal technique using a hot snare. Resection and retrieval        were complete. There was visible vessle at the base that was treated with        Coagulation grasper. The edges of the polpy were treated with soft        coagulation using the snare tip. To  prevent bleeding after the polypectomy,        five hemostatic clips were successfully placed (MR conditional). There was no        bleeding at the end of the procedure.       A less than 5 mm polyp was found in the ascending colon. The polyp was        sessile. The polyp was removed with a cold snare. Resection and retrieval        were complete.       A less than 5 mm polyp was found in the transverse colon. The polyp was        sessile. The polyp was removed with a cold snare. Resection and retrieval        were complete.       A 12 mm polyp was found in the descending colon. The polyp was sessile. The        polyp was removed with a cold snare. Resection and retrieval were complete.        To prevent bleeding after the polypectomy, three hemostatic clips were        successfully placed (MR conditional). There was no bleeding at the end of the        procedure.       A 10 mm polyp was found in the sigmoid colon. The polyp was sessile. The        polyp was removed with a cold snare. Resection and retrieval were complete.       A less than 5 mm polyp was found in the rectum. The polyp was sessile. The        polyp was removed with a cold snare. Resection and retrieval were complete.  Scope Withdrawal Time: 0 hours 57 minutes 43 seconds   Post Procedure Diagnosis:       - One less than 5 mm polyp in the ascending colon, removed  with a cold snare.        Resected and retrieved.       - One 18 mm polyp in the cecum, removed piecemeal using a hot snare. Resected        and retrieved. Injected. Clips (MR conditional) were placed.       - One less than 5 mm polyp in the ascending colon, removed with a cold snare.        Resected and retrieved.       - One less than 5 mm polyp in the transverse colon, removed with a cold        snare. Resected and retrieved.       - One 12 mm polyp in the descending colon, removed with a cold snare.        Resected and retrieved. Clips (MR conditional) were placed.       - One 10 mm polyp in the sigmoid colon, removed with a cold snare. Resected        and retrieved.       - One 10 mm polyp in the sigmoid colon, removed with a cold snare. Resected        and retrieved.       - One less than 5 mm polyp in the rectum, removed with a cold snare. Resected        and retrieved.  Complications:         No immediate complications. Estimated blood loss: Minimal.  Recommendation:       -  Transfer the patient to the recovery area. Discharge the patient when the        patient meets discharge criteria. Please provide the patient a copy of the        procedure(s) report upon discharge for the patient records and to share with        Primary care provider.       - Dr Trinda Pascal will follow up on the results of the biopsies and will contact        you by mail or phone regarding the results. If you do not recieve the results        within 4 weeks, please contact us at [phone].       - Avoid Nonsteroidal antinflammatory drugs for 3 weeks. This is a very large        group of over the counter medications that include and not limited to        ibuprofen, motrin, celebrex, diclofenac, naproxin, meloxicam, mobic...).       - Transfer the patient to the recovery area. Discharge the patient when the        patient meets discharge criteria. Please provide the patient a copy of the        procedure(s) report upon discharge for the  patient records and to share with        Primary care provider.       - You have metal clip(s) placed in your gastrointestinal tract. if you need        MRI in the future please let your provider know that you have the clip(s) (it        is MRI conditional/ Dura clips)       - Next colonoscopy is due in 3 to 6 months. Schedule today prior to discharge.       -Clear fluids for today. Full liquid diet tomorrow am and advance diet as        tolerated tomorrow  Dr. Wendall Mola MD, MD  12/06/2020 4:23:42 PM  Number of Addenda: 0  Note Initiated On: 12/06/2020 2:30 PM

## 2020-12-06 NOTE — H&P (Signed)
GI Pre-procedure History and Physical Short Form    Chief Complaint: The patient is here for Colonoscopy    HPI: 65 year old presents today for colonoscopy. Presenting discomfort in the LLQ since November 2021. Reports that discomfort is worse when constipated, improves partially with bowel movements.    There is no history of blood per rectum, diarrhea, or unintended weight loss. There is no personal or family history of colon cancer or IBD.     Patient tolerated bowel prep well. Bowel movements clear.     Previous colonoscopy in April 2018 with polyp x1 removed (tubular adenoma).      Active Problems:   Patient Active Problem List:     Opioid dependence on agonist therapy (HCC)     Tobacco use disorder     Fibrocystic breast     Elevated BP without diagnosis of hypertension     Knee pain     Chronic low back pain     OA (osteoarthritis) of knee     H. pylori infection     Major depressive disorder, recurrent episode, severe (HCC)     Occult blood in stools     Prediabetes     Epigastric pain     Chronic obstructive pulmonary disease (HCC)     Left foot pain     Adenomatous polyp of colon     Acquired deformity of toe, right     Dislocation of metatarsophalangeal joint of lesser toe, right, sequela     Postmenopausal atrophic vaginitis     Abnormal loss of weight     Thoracic aortic aneurysm without rupture (HCC)     COVID-19 virus infection     BMI 32.0-32.9,adult     Dysuria     LLQ pain      Past Medical History:   Past Medical History:  No date: Anxiety  No date: Arthritis  No date: COPD (chronic obstructive pulmonary disease) (HCC)  No date: Depression  No date: Obese  No date: Substance addiction ([place])  No date: Varicose veins of lower extremities       Past Surgical History:  Past Surgical History:  No date: ANES HRNA REPAIR UPR ABD TABDL RPR DIPHRG HRNA  No date: ANES IPER LOWER ABD W/LAPS RAD HYSTERECTOMY  No date: ENDOMETRIAL ABLTJ THERMAL W/O HYSTEROSCOPIC GID  No date: RPR 1ST INGUN HRNA PRETERM INFT  [facility]       Social History:  Social History    Tobacco Use      Smoking status: Current Some Day Smoker        Packs/day: 1.00        Years: 20.00        Pack years: 20        Types: Cigarettes      Smokeless tobacco: Never Used      Tobacco comment: quit 1980-1996, then light until 2003, then 1 ppd since    Alcohol use: No      Family History:  Review of patient's family history indicates:  Problem: Heart      Relation: Father          Age of Onset: (Not Specified)          Comment: CAD, died age 108  Problem: Pulmonary      Relation: Father          Age of Onset: (Not Specified)          Comment: COPD  Problem: Alcohol/Drug Abuse  Relation: Father          Age of Onset: (Not Specified)          Comment: alcohol use disorder  Problem: Mental/Emotional Disorders      Relation: Father          Age of Onset: (Not Specified)          Comment: PTSD  Problem: Heart      Relation: Mother          Age of Onset: (Not Specified)          Comment: CAD  Problem: Heart      Relation: Brother          Age of Onset: (Not Specified)          Comment: CAD, died age 55  Problem: Cancer - Breast      Relation: Maternal Aunt          Age of Onset: (Not Specified)          Comment: age 72  Problem: Cancer - Breast      Relation: Maternal Aunt          Age of Onset: (Not Specified)          Comment: age 72      Medications at Home:   (Not in a hospital admission)        Current Outpatient Medications:     buprenorphine-naloxone (SUBOXONE) 8-2 MG sublingual film, Take two and one-half films under the tongue daily., Disp: 35 Film, Rfl: 0    polyethylene glycol (GOLYTELY) 236 g suspension, Take 4,000 mLs by mouth See Admin Instructions Take as directed prior to your colonoscopy, Disp: 4000 mL, Rfl: 0    Simethicone 80 MG TABS, Take 4 tablets by mouth See Admin Instructions Take as directed prior to colonoscopy., Disp: 4 tablet, Rfl: 0    naproxen (NAPROSYN) 500 MG tablet, TAKE 1 TABLET BY MOUTH WITH FOOD EVERY TWELVE HOURS AS NEEDED  FOR PAIN, Disp: 20 tablet, Rfl: 0    estradiol (ESTRACE VAGINAL) 0.1 MG/GM vaginal cream, Place 4 g vaginally 3 (three) times a week, Disp: 127.5 g, Rfl: 1    cholecalciferol (VITAMIN D3) 1000 UNIT tablet, Take 1,000 Units by mouth daily, Disp: , Rfl:     acetaminophen (TYLENOL) 500 MG tablet, Take 500 mg by mouth every 6 (six) hours as needed for Pain, Disp: , Rfl:     umeclidinium (INCRUSE ELLIPTA) 62.5 MCG/INH inhaler, INHALE 1 PUFF BY MOUTH INTO LUNGS DAILY, Disp: 1 each, Rfl: 11    buPROPion (WELLBUTRIN SR) 200 MG 12 hr tablet, Take 1 tablet by mouth 2 (two) times daily, Disp: 180 tablet, Rfl: 3    doxepin (SINEQUAN) 10 MG capsule, Take 1-2 capsules by mouth daily  for 14 days, Disp: 28 capsule, Rfl: 0    albuterol (VENTOLIN HFA) 108 (90 Base) MCG/ACT inhaler, INHALE 2 PUFFS INTO THE LUNGS EVERY 6 HOURS AS NEEDED FOR WHEEZING OR SHORTNESS OF BREATH, Disp: 8.5 g, Rfl: 11    ipratropium-albuterol (DUO-NEB) 0.5-2.5 (3) MG/3ML SOLN Inhalation Solution, Take 3 mLs by nebulization 4 (four) times daily., Disp: 180 mL, Rfl: 0    Current Facility-Administered Medications:     lactated ringers infusion, , Intravenous, Continuous, Izora Gala, MD, Last Rate: 30 mL/hr at 12/06/20 1316, New Bag at 12/06/20 1316    Allergies:   Review of Patient's Allergies indicates:   Darvon  Meperidine hcl              Comment:Nausea/vomit   Paxil [paroxetine]      Rash   Zoloft [sertraline *    Rash      Review of Systems:  Denies chest pain, SOB, VTE, coagulopathy       Physical Exam:  General: no acute distress  HEENT: Normocephalic, atrumatic, PERRLA, Extra occular motions intact. No scleral icterus. Pharynx benign. Tongue midline.  Pulmonary: No respiratory distress, on room air.  Cardiovascular: RRR.  JVD not elevated with patient sitting.  Abdominal: Soft, non tender, not distended.  Extremities: No cyanosis or edema.   Neurological: Grossly intact    ASA Classification: ASA Class III (a patient  with severe systemic disease that limits but is not incapacitating)      Impression:  65yo female with personal history of tubular adenoma. Presenting with LLQ abdominal pain since Nov 2021.      Medical Decision Making:  Proceed with colonoscopy.      Gery Pray, MD MPH  General Surgery Resident, PGY-2

## 2020-12-06 NOTE — Anesthesia Preprocedure Evaluation (Signed)
Pre-Anesthetic Note  .      Patient: Cheryl Dixon is a 65 year old female      Procedure Information     Date/Time: 12/06/20 1310    Scheduled providers: Izora Gala, MD; Gwenette Greet, MD    Procedure: COLONOSCOPY    Diagnosis:       Adenomatous polyp of colon, unspecified part of colon [D12.6]      Encounter for screening colonoscopy [Z12.11]    Location: [facility] - Gastroenterology          Relevant Problems   No relevant active problems           Previous Anesthetic History:   Past Surgical History:  No date: ANES HRNA REPAIR UPR ABD TABDL RPR DIPHRG HRNA  No date: ANES IPER LOWER ABD W/LAPS RAD HYSTERECTOMY  No date: ENDOMETRIAL ABLTJ THERMAL W/O HYSTEROSCOPIC GID  No date: RPR 1ST INGUN HRNA PRETERM INFT [facility]        Medications  Current Outpatient Medications   Medication Sig    buprenorphine-naloxone (SUBOXONE) 8-2 MG sublingual film Take two and one-half films under the tongue daily.    polyethylene glycol (GOLYTELY) 236 g suspension Take 4,000 mLs by mouth See Admin Instructions Take as directed prior to your colonoscopy    Simethicone 80 MG TABS Take 4 tablets by mouth See Admin Instructions Take as directed prior to colonoscopy.    naproxen (NAPROSYN) 500 MG tablet TAKE 1 TABLET BY MOUTH WITH FOOD EVERY TWELVE HOURS AS NEEDED FOR PAIN    estradiol (ESTRACE VAGINAL) 0.1 MG/GM vaginal cream Place 4 g vaginally 3 (three) times a week    cholecalciferol (VITAMIN D3) 1000 UNIT tablet Take 1,000 Units by mouth daily    acetaminophen (TYLENOL) 500 MG tablet Take 500 mg by mouth every 6 (six) hours as needed for Pain    umeclidinium (INCRUSE ELLIPTA) 62.5 MCG/INH inhaler INHALE 1 PUFF BY MOUTH INTO LUNGS DAILY    buPROPion (WELLBUTRIN SR) 200 MG 12 hr tablet Take 1 tablet by mouth 2 (two) times daily    doxepin (SINEQUAN) 10 MG capsule Take 1-2 capsules by mouth daily  for 14 days    albuterol (VENTOLIN HFA) 108 (90 Base) MCG/ACT inhaler INHALE 2 PUFFS INTO THE  LUNGS EVERY 6 HOURS AS NEEDED FOR WHEEZING OR SHORTNESS OF BREATH    ipratropium-albuterol (DUO-NEB) 0.5-2.5 (3) MG/3ML SOLN Inhalation Solution Take 3 mLs by nebulization 4 (four) times daily.     Current Facility-Administered Medications   Medication    lactated ringers infusion         Allergies:   Review of Patient's Allergies indicates:   Darvon                     Meperidine hcl              Comment:Nausea/vomit   Paxil [paroxetine]      Rash   Zoloft [sertraline *    Rash    Smoking, Alcohol, Drugs:  Social History    Tobacco Use      Smoking status: Current Some Day Smoker        Packs/day: 1.00        Years: 20.00        Pack years: 20        Types: Cigarettes      Smokeless tobacco: Never Used      Tobacco comment: quit 1980-1996, then light until 2003, then  1 ppd since    Alcohol use: No      Drug use:    Types: Marijuana   Comment: past opioids, nasal heroin, 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  MJ 1x per week       PMHx:  Past Medical History:  No date: Anxiety  No date: Arthritis  No date: COPD (chronic obstructive pulmonary disease) (HCC)  No date: Depression  No date: Obese  No date: Substance addiction (HCC)  No date: Varicose veins of lower extremities    Vitals  BP (!) 119/99    Pulse 64    Temp 98.3 F (36.8 C) (Temporal)    Resp 14    Ht _0  (1.549 m)    Wt 88.9 kg (196 lb)    LMP 11/20/1991    SpO2 99%    BMI 37.03 kg/m     Review of Systems        Anesthetic History:   negative anesthesia history ROS           Cardiovascular: Positive for hypertension.   Pulmonary: Positive for COPD and tobacco use.   GU/Renal: Negative for GU/renal diseases.    Hepatic: Skin negative for hepatic disease.    Neurological: Negative for seizures and strokes.   Gastrointestinal: Negative for GERD.   Hematological: Negative for hematological diseases.    Musculoskeletal: Positive for arthralgias and back pain.   Psychiatric: Positive for depression.         On Suboxone   Other:BMI greater than 30 kg/m2      Physical  Exam    General     Level of consciousness:  Alert and oriented (time, person, place)   Airway     Mallampati:  II    TM distance:  >3 FB    Mouth opening:  >3 FB    Neck ROM:  Full   Teeth    (+) upper dentures  }   Heart      Lungs              Pertinent Labs:   Lab Results   Component Value Date    NA 143 10/22/2020    K 4.5 10/22/2020    CREAT 0.9 10/22/2020    GLUCOSER 91 10/22/2020    WBC 5.7 03/01/2020    HCT 43.6 03/01/2020    PLTA 288 03/01/2020    PT 10.0 02/16/2007    APTT 26.1 02/16/2007    INR 1.0 (L) 02/16/2007         Anesthesia Plan    ASA Score:     ASA:  3    Airway:      Mallampati:  II    Mouth opening:  >3 FB    Neck ROM:  Full    TM distance:  >3 FB    Plan: MAC    Other information:     EKG Reviewed: : No      Full Stomach Precaution:: No      Post-Plan::  PACU    Informed Consent:     Anesthetic plan and risks discussed with:  Patient   Patient Consented        Attending Anesthesiologist Statement:     Reassessed day of surgery: Yes        Assessment made, necessary equipment and appropriate plan in place.

## 2020-12-10 ENCOUNTER — Telehealth (HOSPITAL_BASED_OUTPATIENT_CLINIC_OR_DEPARTMENT_OTHER): Payer: Self-pay | Admitting: Ambulatory Care

## 2020-12-10 ENCOUNTER — Telehealth (HOSPITAL_BASED_OUTPATIENT_CLINIC_OR_DEPARTMENT_OTHER): Payer: Self-pay | Admitting: Internal Medicine

## 2020-12-10 NOTE — Telephone Encounter (Addendum)
I called the patient as she reached out to the [facility] c/o pain.  She was trying to reach GI as she has had abd pain post colonoscopy that she had last Wednesday. She reports bloating. No brbpr.  Worried that she also has an UTI.  I had her review her AVS and the number to call is [phone].  I told her that I would send a  Message to the [place].  The patient appreciated the f/u.

## 2020-12-10 NOTE — Telephone Encounter (Addendum)
Spoke with pt. Explained that we are not GI. Tried to call them but the office is closed. Transferred pt to [place] [facility] on call line to ask a provider to call her. To ED if any worsening pain but pt says she has had this abd pain right along    Called back to pt has left message for oc provider, aware of where she can be seen at [facility] or [place]

## 2020-12-10 NOTE — Telephone Encounter (Signed)
-----   Message from Arbie Cookey sent at 12/10/2020  4:57 PM EDT -----  Juluis Rainier  ----- Message -----  From: Johny Drilling  Sent: 12/10/2020   4:06 PM EDT  To: [facility] Nurses Pool, [facility] Front Desk Pool    Cheryl Dixon [id number], 65 year old, female    Calls today:  Appt/ having severe pain, would like a call back  Person calling on behalf of patient: Patient (self)    Cell phone:  [phone]    Other phone:    Patient's language of care: English    Patient does not need an interpreter.    Patient's PCP: [name], MD

## 2020-12-10 NOTE — Telephone Encounter (Signed)
ON CALL PAGER    Paged by pt for abd pain after colonoscopy 4 days ago.  Has been trying to reach [facility] GI today unsuccessfully.    Has been seen for the past year for ?UTI.  S/p hysterectomy, appy, chole.  Pain on LLQ.    Had colo 8/11.  Followed diet instructions for slow advancement.  Gets "attacks" of pain in LLQ.  Hasn't taken any pain medicine because when does she can't pee.  Drinking a gallon of water a day.  Today drank 2 cranberry juices, almost 1/2 gallon of water.  Pain has improved a little since 2pm.   Pain is stabbing pain, radiating to lower back. Also gets cramps but not severe.  No blood per rectum.  No blood in urine.  This LLQ pain is NOT new since the colonoscopy. This is the same pain has been getting for a long time, just not as severe before.  Suboxone was helping with the pain before.  Now has dysuria as well.  No fevers, no chills, no constipation.    [facility] called her tonight. They had someone see her tonight and took a urine sample for UCx, will get results in 3 days.

## 2020-12-11 ENCOUNTER — Ambulatory Visit (HOSPITAL_BASED_OUTPATIENT_CLINIC_OR_DEPARTMENT_OTHER): Payer: Self-pay | Admitting: Registered Nurse

## 2020-12-11 NOTE — Telephone Encounter (Signed)
Regarding: discomfort and pain after colonoscopy  ----- Message from [name] sent at 12/11/2020  9:58 AM EDT -----  [name] [id number], 65 year old, female    Calls today:  Sick    What are the symptoms discomfort and pain after colonoscopy - patient would like to speak to her nurse regarding her symptoms is a bit anxious is unsure if the pain is a UTI or because of the colonoscopy please advise       Patient's language of care: English    Patient does not need an interpreter.    Patient's PCP: [name], MD

## 2020-12-11 NOTE — Telephone Encounter (Signed)
Left vm for pt to call rcc back.

## 2020-12-11 NOTE — Telephone Encounter (Signed)
Call placed to pt. Pt was advised by RN to go to ED for evaluation to r/o complications related to colonoscopy. Pt states that she has no abdominal pain today. She provided a urine specimen yesterday for UC and was able to drink one gallon of water and 34 ounces of cranberry juice. She voided large amounts in the evening and states that the pain went away completely. She states that she is going for follow up visit with Dr. Lisa Roca. Pt declines to go to ED at this time.          Message  Received: Today  Cheryl Gala, MD  [name], MD; [facility] Front Desk Pool; [facility] Nurses Pool  Caller: Unspecified Cheryl Dixon, 5:26 PM)  Nurse pool,   Please advise the patient to go to the ED today For evaluation to r.o complication related to the colonoscopy.   Thank you

## 2020-12-13 LAB — SURGICAL PATH SPECIMEN GASTROINTESTINAL

## 2020-12-15 ENCOUNTER — Encounter (HOSPITAL_BASED_OUTPATIENT_CLINIC_OR_DEPARTMENT_OTHER): Payer: Self-pay | Admitting: Gastroenterology

## 2020-12-18 ENCOUNTER — Other Ambulatory Visit (HOSPITAL_BASED_OUTPATIENT_CLINIC_OR_DEPARTMENT_OTHER): Payer: Self-pay | Admitting: Family Medicine

## 2020-12-18 ENCOUNTER — Ambulatory Visit (HOSPITAL_BASED_OUTPATIENT_CLINIC_OR_DEPARTMENT_OTHER): Payer: 59 | Admitting: Family Medicine

## 2020-12-18 ENCOUNTER — Other Ambulatory Visit: Payer: Self-pay

## 2020-12-18 ENCOUNTER — Ambulatory Visit (INDEPENDENT_AMBULATORY_CARE_PROVIDER_SITE_OTHER): Payer: 59 | Admitting: Family Medicine

## 2020-12-18 ENCOUNTER — Encounter (INDEPENDENT_AMBULATORY_CARE_PROVIDER_SITE_OTHER): Payer: Self-pay | Admitting: Family Medicine

## 2020-12-18 VITALS — BP 140/80 | HR 98 | Temp 98.1°F | Ht <= 58 in | Wt 187.0 lb

## 2020-12-18 DIAGNOSIS — F1120 Opioid dependence, uncomplicated: Secondary | ICD-10-CM

## 2020-12-18 DIAGNOSIS — Z6841 Body Mass Index (BMI) 40.0 and over, adult: Secondary | ICD-10-CM | POA: Diagnosis not present

## 2020-12-18 DIAGNOSIS — I10 Essential (primary) hypertension: Secondary | ICD-10-CM | POA: Diagnosis not present

## 2020-12-18 NOTE — Telephone Encounter (Signed)
PER Patient (Cheryl Dixon),@ is a 65 year old female has requested a refill of suboxone       Last prescribed - start date: 12/04/20 end date: 12/18/20     Last Office Visit: 11/30/20  Last Physical Exam: 05/27/13  There are no preventive care reminders to display for this patient.     Other Med Adult:  Most Recent BP Reading(s)  12/06/20 : 117/54        Cholesterol (mg/dL)   Date Value   10/22/2020 233     LOW DENSITY LIPOPROTEIN DIRECT (mg/dL)   Date Value   10/22/2020 158     HIGH DENSITY LIPOPROTEIN (mg/dL)   Date Value   10/22/2020 40     No results found for: TG      THYROID SCREEN TSH REFLEX FT4 (uIU/mL)   Date Value   01/12/2019 1.630         No results found for: TSH    HEMOGLOBIN A1C (%)   Date Value   01/12/2019 4.6       No results found for: POCA1C      INR (no units)   Date Value   02/16/2007 1.0 (L)   07/03/2006 < 1.0 (L)       SODIUM (mmol/L)   Date Value   10/22/2020 143       POTASSIUM (mmol/L)   Date Value   10/22/2020 4.5           CREATININE (mg/dL)   Date Value   10/22/2020 0.9        Documented patient preferred pharmacies:    [facility] #[id number][place] [address] Phone: [phone]: [address] Phone: [phone]: [phone]

## 2020-12-18 NOTE — Progress Notes (Signed)
Chief Complaint:   OBESITY Michelle Roberts is here to discuss her progress with her obesity treatment plan along with follow-up of her obesity related diagnoses. Michelle Roberts is on the [employer] and states she is following her eating plan approximately 30% of the time. Michelle Roberts states she is walking for 20 minutes 3 times per week and physical therapy 1 time per week.  Today's visit was #: 5 Starting weight: 202 lbs Starting date: 06/22/2020 Today's weight: 187 lbs Today's date: 12/18/2020 Total lbs lost to date: 15 Total lbs lost since last in-office visit: 1  Interim History: Michelle Roberts was on vacation and she still did well with weight loss. She did some celebration eating but she was mindful of her food choices. She has already gotten back on track.  Subjective:   1. Essential hypertension Michelle Roberts's blood pressures are mildly elevated in the office. She states her blood pressure readings are 120-130/70 at home. She has done well with diet and weight loss.  Assessment/Plan:   1. Essential hypertension Michelle Roberts will continue diet, exercise, and weight loss to improve blood pressure control. She will continue to follow up as she continues her lifestyle modifications.  2. Obesity with current BMI 39.2 Michelle Roberts is currently in the action stage of change. As such, her goal is to continue with weight loss efforts. She has agreed to the [employer].   Exercise goals: All adults should avoid inactivity. Some physical activity is better than none, and adults who participate in any amount of physical activity gain some health benefits.  Behavioral modification strategies: increasing lean protein intake and meal planning and cooking strategies.  Michelle Roberts has agreed to follow-up with our clinic in 3 to 4 weeks. She was informed of the importance of frequent follow-up visits to maximize her success with intensive lifestyle modifications for her multiple health conditions.   Objective:   Blood pressure  140/80, pulse 98, temperature 98.1 F (36.7 C), height '4\' 10"'$  (1.473 m), weight 187 lb (84.8 kg), SpO2 98 %. Body mass index is 39.08 kg/m.  General: Cooperative, alert, well developed, in no acute distress. HEENT: Conjunctivae and lids unremarkable. Cardiovascular: Regular rhythm.  Lungs: Normal work of breathing. Neurologic: No focal deficits.   Lab Results  Component Value Date   CREATININE 1.0 10/04/2020   BUN 22 (A) 10/04/2020   NA 140 09/19/2020   K 4.3 09/19/2020   CL 95 (L) 09/19/2020   CO2 24 09/19/2020   Lab Results  Component Value Date   ALT 22 10/04/2020   AST 23 10/04/2020   ALKPHOS 73 10/04/2020   BILITOT 0.5 09/19/2020   Lab Results  Component Value Date   HGBA1C 6.7 (H) 09/19/2020   Lab Results  Component Value Date   INSULIN 9.3 09/19/2020   Lab Results  Component Value Date   TSH 0.07 (A) 10/04/2020   Lab Results  Component Value Date   CHOL 232 (H) 09/19/2020   HDL 95 09/19/2020   LDLCALC 123 (H) 09/19/2020   TRIG 85 09/19/2020   Lab Results  Component Value Date   VD25OH 56.1 09/19/2020   VD25OH 46.7 05/01/2015   Lab Results  Component Value Date   WBC 8.9 10/04/2020   HGB 13.3 10/04/2020   HCT 42 10/04/2020   MCV 80 09/19/2020   PLT 252 10/04/2020   Lab Results  Component Value Date   FERRITIN 456 10/04/2020   Attestation Statements:   Reviewed by clinician on day of visit: allergies, medications, problem list,  medical history, surgical history, family history, social history, and previous encounter notes.  Time spent on visit including pre-visit chart review and post-visit care and charting was 36 minutes.    I, Trixie Dredge, am acting as transcriptionist for Dennard Nip, MD.  I have reviewed the above documentation for accuracy and completeness, and I agree with the above. -  Dennard Nip, MD

## 2020-12-20 ENCOUNTER — Telehealth (HOSPITAL_BASED_OUTPATIENT_CLINIC_OR_DEPARTMENT_OTHER): Payer: Self-pay | Admitting: Internal Medicine

## 2020-12-20 DIAGNOSIS — I712 Thoracic aortic aneurysm, without rupture, unspecified: Secondary | ICD-10-CM

## 2020-12-20 NOTE — Telephone Encounter (Signed)
REFERRAL REQUEST- PROVIDER, PLEASE REVIEW REFERRAL REQUEST- PROVIDER, PLEASE REVIEW AND SIGN ORDER IF APPROPRIATE      HOW IS REFERRAL BEING REQUESTED: phone    WHO IS REFERRAL BEING REQUESTED BY: [facility]    REFERRED TO SPECIALTY: Thoracic Surgery    DIAGNOSIS/CHIEF COMPLAINT: Thoracic aortic aneurysm without rupture     HAVE YOU SEEN YOUR PCP FOR THIS ISSUE: yes    HAVE YOU SEEN YOUR PCP WITHIN THE LAST YEAR: yes      [facility] ONLY  PROVIDER: Dewain Penning  DOS: 06/04/20, 06/11/20, 07/02/20 and 01/07/21  NPI: [id number]  LOCATION: [place]  PHONE: [phone]  [employer]: [phone]

## 2020-12-21 ENCOUNTER — Ambulatory Visit (HOSPITAL_BASED_OUTPATIENT_CLINIC_OR_DEPARTMENT_OTHER): Payer: 59 | Admitting: Student in an Organized Health Care Education/Training Program

## 2021-01-01 ENCOUNTER — Telehealth (HOSPITAL_BASED_OUTPATIENT_CLINIC_OR_DEPARTMENT_OTHER): Payer: Self-pay

## 2021-01-01 ENCOUNTER — Ambulatory Visit (HOSPITAL_BASED_OUTPATIENT_CLINIC_OR_DEPARTMENT_OTHER): Payer: 59 | Admitting: Family Medicine

## 2021-01-01 ENCOUNTER — Other Ambulatory Visit (HOSPITAL_BASED_OUTPATIENT_CLINIC_OR_DEPARTMENT_OTHER): Payer: Self-pay | Admitting: Family Medicine

## 2021-01-01 DIAGNOSIS — F1120 Opioid dependence, uncomplicated: Secondary | ICD-10-CM

## 2021-01-01 MED ORDER — BUPRENORPHINE HCL-NALOXONE HCL 8-2 MG SL FILM
ORAL_FILM | SUBLINGUAL | 0 refills | Status: DC
Start: 2021-01-01 — End: 2021-01-15

## 2021-01-01 NOTE — Telephone Encounter (Addendum)
Called and spoke with patient, reports she spoke with someone on Friday, regarding her estradiol, last time she had 3 month supply filled they only had 2 of the original brand patient uses and they gave her a different one as well. Reports the other brands are not as effective as the one she usually uses.  The one that works best is [employer]er, industrial/product by [employer].    Would like to be able to get this straightened out before refill is due, she asked about sending it to another pharmacy, suggested she call pharmacies to see which brand they carry, she agreed, I advised I would check with [facility] for her as well.      ----- Message from Janalyn Shy sent at 01/01/2021  1:13 PM EDT -----  Regarding: estradiol  Patient requesting call back from nurse in regards to estradiol prescription.  Patient stated that nurse was suppose to call last Friday.     Please contact patient at [phone].     Please advise. Thank you

## 2021-01-01 NOTE — Telephone Encounter (Signed)
Looks like [facility] in [place] has the [employer] brand in stock

## 2021-01-01 NOTE — Telephone Encounter (Signed)
Refilling suboxone as she reached out to [facility].  However, Lachlyn needs to attend future groups and to avoid texting [name] inappropriately.

## 2021-01-02 ENCOUNTER — Encounter (HOSPITAL_BASED_OUTPATIENT_CLINIC_OR_DEPARTMENT_OTHER): Payer: Self-pay

## 2021-01-02 NOTE — Telephone Encounter (Signed)
My chart message sent to patient regarding [facility] pharmacy having preferred brand

## 2021-01-04 ENCOUNTER — Telehealth (HOSPITAL_BASED_OUTPATIENT_CLINIC_OR_DEPARTMENT_OTHER): Payer: Self-pay | Admitting: Registered Nurse

## 2021-01-04 ENCOUNTER — Other Ambulatory Visit (HOSPITAL_BASED_OUTPATIENT_CLINIC_OR_DEPARTMENT_OTHER): Payer: Self-pay | Admitting: Internal Medicine

## 2021-01-04 DIAGNOSIS — N952 Postmenopausal atrophic vaginitis: Secondary | ICD-10-CM

## 2021-01-04 MED ORDER — ESTRADIOL 0.1 MG/GM VA CREA
4.0000 g | TOPICAL_CREAM | VAGINAL | 1 refills | Status: DC
Start: 2021-01-04 — End: 2021-01-07

## 2021-01-04 MED ORDER — POLYETHYLENE GLYCOL 3350 17 G PO PACK
PACK | ORAL | 2 refills | Status: DC
Start: 2021-01-04 — End: 2021-01-23

## 2021-01-04 MED ORDER — NICOTINE 21 MG/24HR TD PT24
MEDICATED_PATCH | TRANSDERMAL | 1 refills | Status: DC
Start: 2021-01-04 — End: 2021-01-23

## 2021-01-04 NOTE — Telephone Encounter (Signed)
Pt calling reporting they will not approve rx  Reports she accidentally through out old rx.  Pt crying, stating it took her a year to get related, and now she is so afraid to go with out.  States she will need to go to ED if cannot get med

## 2021-01-04 NOTE — Telephone Encounter (Signed)
Patient phoned in stating that [facility] have no more refills       PER Patient (self), Cheryl Dixon is a 65 year old female has requested a refill of      -  estradiol        Last Office Visit: 11/30/20 with [facility]   Last Physical Exam: 05/27/13     There are no preventive care reminders to display for this patient.     Other Med Adult:  Most Recent BP Reading(s)  12/06/20 : 117/54        Cholesterol (mg/dL)   Date Value   10/22/2020 233     LOW DENSITY LIPOPROTEIN DIRECT (mg/dL)   Date Value   10/22/2020 158     HIGH DENSITY LIPOPROTEIN (mg/dL)   Date Value   10/22/2020 40     No results found for: TG      THYROID SCREEN TSH REFLEX FT4 (uIU/mL)   Date Value   01/12/2019 1.630         No results found for: TSH    HEMOGLOBIN A1C (%)   Date Value   01/12/2019 4.6       No results found for: POCA1C      INR (no units)   Date Value   02/16/2007 1.0 (L)   07/03/2006 < 1.0 (L)       SODIUM (mmol/L)   Date Value   10/22/2020 143       POTASSIUM (mmol/L)   Date Value   10/22/2020 4.5           CREATININE (mg/dL)   Date Value   10/22/2020 0.9        Documented patient preferred pharmacies:    [facility] #[id number][place] Phone: [phone]: [place] Phone: [phone]: [phone]

## 2021-01-04 NOTE — Telephone Encounter (Signed)
-----   Message from [facility] sent at 01/04/2021  4:27 PM EDT -----  Cheryl Dixon [id number], 65 year old, female    Calls today:  Medication Questions  Patient Calling    What medication do you have questions about ESTRACE VAGINAL 0.1 MG/GM vaginal cream     Person calling on behalf of patient: Patient (self)    CALL BACK NUMBER: [phone]  Patient's language of care: English    Patient does not need an interpreter.    Patient's PCP: [name], MD

## 2021-01-05 ENCOUNTER — Other Ambulatory Visit (HOSPITAL_BASED_OUTPATIENT_CLINIC_OR_DEPARTMENT_OTHER): Payer: Self-pay | Admitting: Internal Medicine

## 2021-01-05 NOTE — Telephone Encounter (Signed)
PER Pharmacy, Cheryl Dixon is a 65 year old female has requested a refill of wellbutrin sr .      Last Office Visit: 11/30/20 with lorky libaridian   Last Physical Exam: 05/27/2013      Other Med Adult:  Most Recent BP Reading(s)  12/06/20 : 117/54        Cholesterol (mg/dL)   Date Value   10/22/2020 233     LOW DENSITY LIPOPROTEIN DIRECT (mg/dL)   Date Value   10/22/2020 158     HIGH DENSITY LIPOPROTEIN (mg/dL)   Date Value   10/22/2020 40     No results found for: TG      THYROID SCREEN TSH REFLEX FT4 (uIU/mL)   Date Value   01/12/2019 1.630         No results found for: TSH    HEMOGLOBIN A1C (%)   Date Value   01/12/2019 4.6       No results found for: POCA1C      INR (no units)   Date Value   02/16/2007 1.0 (L)   07/03/2006 < 1.0 (L)       SODIUM (mmol/L)   Date Value   10/22/2020 143       POTASSIUM (mmol/L)   Date Value   10/22/2020 4.5           CREATININE (mg/dL)   Date Value   10/22/2020 0.9       Documented patient preferred pharmacies:    [id number] [place] [address] Phone: [phone]: [phone]

## 2021-01-07 MED ORDER — ESTRADIOL 0.1 MG/GM VA CREA
5.0000 g | TOPICAL_CREAM | VAGINAL | 1 refills | Status: DC
Start: 2021-01-07 — End: 2021-01-23

## 2021-01-07 NOTE — Telephone Encounter (Signed)
I spoke to patient.  Pharmacy said too soon to fill.  Dose is 0.5 - 1 mg estradiol (= 5-10 g of cream) three times per week.  Increased dose go 5 g three times a week, and hopefully they will fill a bit sooner.

## 2021-01-10 ENCOUNTER — Ambulatory Visit (INDEPENDENT_AMBULATORY_CARE_PROVIDER_SITE_OTHER): Payer: 59 | Admitting: Family Medicine

## 2021-01-10 ENCOUNTER — Encounter (INDEPENDENT_AMBULATORY_CARE_PROVIDER_SITE_OTHER): Payer: Self-pay | Admitting: Family Medicine

## 2021-01-10 ENCOUNTER — Other Ambulatory Visit: Payer: Self-pay

## 2021-01-10 VITALS — BP 142/91 | HR 92 | Temp 98.3°F | Ht <= 58 in | Wt 185.0 lb

## 2021-01-10 DIAGNOSIS — I10 Essential (primary) hypertension: Secondary | ICD-10-CM | POA: Diagnosis not present

## 2021-01-10 DIAGNOSIS — Z6841 Body Mass Index (BMI) 40.0 and over, adult: Secondary | ICD-10-CM

## 2021-01-13 NOTE — Progress Notes (Signed)
Chief Complaint:   OBESITY Michelle Roberts is here to discuss her progress with her obesity treatment plan along with follow-up of her obesity related diagnoses. Michelle Roberts is on the [employer] and states she is following her eating plan approximately 50% of the time. Michelle Roberts states she is doing 0 minutes 0 times per week.  Today's visit was #: 6 Starting weight: 202 lbs Starting date: 06/22/2020 Today's weight: 185 lbs Today's date: 01/10/2021 Total lbs lost to date: 17 Total lbs lost since last in-office visit: 2  Interim History: Michelle Roberts continues to do well with weight loss. She is frustrated with her chronic pain issues and she is feeling overwhelmed with trying to eat healthy, but she is getting a lot of conflicting information from various locations.  Subjective:   1. Essential hypertension Michelle Roberts's blood pressure is elevated today, but she was frustrated which likely contributed. She reports her blood pressure was improved. She is on Dyazide and is doing well with diet and weight loss.  Assessment/Plan:   1. Essential hypertension Michelle Roberts will continue with diet, exercise, and her medications to improve blood pressure control. We will recheck her blood pressure in 3 weeks as she continues her lifestyle modifications.  2. Obesity with current BMI 38.8 Michelle Roberts is currently in the action stage of change. As such, her goal is to continue with weight loss efforts. She has agreed to the [employer].   We spent a lot of time discussing the inflammatory nature of fat and how her food choices factor into this.  Behavioral modification strategies: increasing lean protein intake and decreasing simple carbohydrates.  Michelle Roberts has agreed to follow-up with our clinic in 3 weeks. She was informed of the importance of frequent follow-up visits to maximize her success with intensive lifestyle modifications for her multiple health conditions.   Objective:   Blood pressure (!) 142/91, pulse 92,  temperature 98.3 F (36.8 C), height '4\' 10"'$  (1.473 m), weight 185 lb (83.9 kg), SpO2 97 %. Body mass index is 38.67 kg/m.  General: Cooperative, alert, well developed, in no acute distress. HEENT: Conjunctivae and lids unremarkable. Cardiovascular: Regular rhythm.  Lungs: Normal work of breathing. Neurologic: No focal deficits.   Lab Results  Component Value Date   CREATININE 1.0 10/04/2020   BUN 22 (A) 10/04/2020   NA 140 09/19/2020   K 4.3 09/19/2020   CL 95 (L) 09/19/2020   CO2 24 09/19/2020   Lab Results  Component Value Date   ALT 22 10/04/2020   AST 23 10/04/2020   ALKPHOS 73 10/04/2020   BILITOT 0.5 09/19/2020   Lab Results  Component Value Date   HGBA1C 6.7 (H) 09/19/2020   Lab Results  Component Value Date   INSULIN 9.3 09/19/2020   Lab Results  Component Value Date   TSH 0.07 (A) 10/04/2020   Lab Results  Component Value Date   CHOL 232 (H) 09/19/2020   HDL 95 09/19/2020   LDLCALC 123 (H) 09/19/2020   TRIG 85 09/19/2020   Lab Results  Component Value Date   VD25OH 56.1 09/19/2020   VD25OH 46.7 05/01/2015   Lab Results  Component Value Date   WBC 8.9 10/04/2020   HGB 13.3 10/04/2020   HCT 42 10/04/2020   MCV 80 09/19/2020   PLT 252 10/04/2020   Lab Results  Component Value Date   FERRITIN 456 10/04/2020   Attestation Statements:   Reviewed by clinician on day of visit: allergies, medications, problem list, medical history, surgical history,  family history, social history, and previous encounter notes.  Time spent on visit including pre-visit chart review and post-visit care and charting was 40 minutes.    I, Trixie Dredge, am acting as transcriptionist for Dennard Nip, MD.  I have reviewed the above documentation for accuracy and completeness, and I agree with the above. -  Dennard Nip, MD

## 2021-01-15 ENCOUNTER — Ambulatory Visit: Payer: 59 | Attending: Family Medicine | Admitting: Family Medicine

## 2021-01-15 ENCOUNTER — Telehealth (HOSPITAL_BASED_OUTPATIENT_CLINIC_OR_DEPARTMENT_OTHER): Payer: Self-pay | Admitting: Family Medicine

## 2021-01-15 DIAGNOSIS — F1120 Opioid dependence, uncomplicated: Secondary | ICD-10-CM | POA: Diagnosis present

## 2021-01-15 MED ORDER — BUPRENORPHINE HCL-NALOXONE HCL 8-2 MG SL FILM
ORAL_FILM | SUBLINGUAL | 0 refills | Status: DC
Start: 2021-01-15 — End: 2021-01-29

## 2021-01-15 NOTE — Progress Notes (Signed)
Nolita Kutter is a 65 year old female seen in follow up for opioid dependence:    Buprenorphine dose: 20mg   Response, adequacy of dose: good  Relapses/close calls: none  Trigger: denies  Meetings: none currently    Social history/events:  Had surgery awhile ago, didn't go as well.  Then phone problems.  Has 2 grandchildren coming.  Almost 1 dozen grandchildren.  Doing okay with recovery.    Present Medications:  estradiol (ESTRACE VAGINAL) 0.1 MG/GM vaginal cream, Place 5 g vaginally 3 (three) times a week, Disp: 170 g, Rfl: 1  buPROPion (WELLBUTRIN SR) 200 MG 12 hr tablet, TAKE 1 TABLET BY MOUTH TWICE A DAY, Disp: 180 tablet, Rfl: 3  polyethylene glycol (HEALTHYLAX) 17 g packet, TAKE 17G BY MOUTH DAILY MIX IN 64 OUNCE OF CLEAR LIQUID AND TAKE as directed, Disp: 28 packet, Rfl: 2  nicotine (NICODERM CQ) 21 MG/24HR, APPLY 1 PATCH TOPICALLY DAILY, Disp: 28 patch, Rfl: 1  [DISCONTINUED] buprenorphine-naloxone (SUBOXONE) 8-2 MG sublingual film, Take 2.5 films under the tongue daily.  NEEDS TO ATTEND APPOINTMENT PRIOR TO FURTHER REFILLS., Disp: 35 Film, Rfl: 0  polyethylene glycol (GOLYTELY) 236 g suspension, Take 4,000 mLs by mouth See Admin Instructions Take as directed prior to your colonoscopy, Disp: 4000 mL, Rfl: 0  Simethicone 80 MG TABS, Take 4 tablets by mouth See Admin Instructions Take as directed prior to colonoscopy., Disp: 4 tablet, Rfl: 0  naproxen (NAPROSYN) 500 MG tablet, TAKE 1 TABLET BY MOUTH WITH FOOD EVERY TWELVE HOURS AS NEEDED FOR PAIN, Disp: 20 tablet, Rfl: 0  cholecalciferol (VITAMIN D3) 1000 UNIT tablet, Take 1,000 Units by mouth daily, Disp: , Rfl:   acetaminophen (TYLENOL) 500 MG tablet, Take 500 mg by mouth every 6 (six) hours as needed for Pain, Disp: , Rfl:   umeclidinium (INCRUSE ELLIPTA) 62.5 MCG/INH inhaler, INHALE 1 PUFF BY MOUTH INTO LUNGS DAILY, Disp: 1 each, Rfl: 11  doxepin (SINEQUAN) 10 MG capsule, Take 1-2 capsules by mouth daily  for 14 days, Disp: 28 capsule, Rfl: 0  albuterol  (VENTOLIN HFA) 108 (90 Base) MCG/ACT inhaler, INHALE 2 PUFFS INTO THE LUNGS EVERY 6 HOURS AS NEEDED FOR WHEEZING OR SHORTNESS OF BREATH, Disp: 8.5 g, Rfl: 11  ipratropium-albuterol (DUO-NEB) 0.5-2.5 (3) MG/3ML SOLN Inhalation Solution, Take 3 mLs by nebulization 4 (four) times daily., Disp: 180 mL, Rfl: 0    No current facility-administered medications on file prior to visit.      PHQ-9 TOTAL SCORE 01/12/2019 06/01/2018 03/03/2017   Doc FlowSheet Total Score - - -   Doc FlowSheet Total Score 10 6 4            PHYSICAL EXAMINATION via video:  General appearance - healthy female in no distress  Neuro - nonfocal  Affect - normal  Behavior without evidence of intoxication or impairment    PMP Reviewed.  No unauthorized prescriptions since last refill.     Assessment:  Opioid Dependence  Comment: Stable in recovery.  Anticipates more grandchildren.  Plan:  Continue buprenorphine, reviewed criteria for tapering when patient is ready. Discussed sources of support.  Patient is counseled regarding relapse prevention, involvement in recovery groups, potential side effects of buprenorphine, and eventual taper of medication.    For the patient participating in the group via telephone and/or video technology: the patient/guardian has verbally consented to participate in this group therapy session by televisit. The patient/guardian was encouraged to be in a private location due to personal health information being discussed and where  they could not be overheard by individuals not participating in the group therapy. Although all [facility] staff/providers participating in this group therapy televisit are in a private location, all the risks of telephone and/or video technology used to conduct this group therapy session cannot be controlled by [place], (i.e. interruptions, unauthorized access/recording and technical difficulties).  Each patient understands that the visit can be stopped at any time for any reason, including if the conferencing  connections are inadequate.       Geni Bers, MD, 01/15/2021

## 2021-01-15 NOTE — Telephone Encounter (Signed)
Left VM for Cheryl Dixon due to missed group.  Shortly after leaving message, she joined group.

## 2021-01-21 ENCOUNTER — Ambulatory Visit (HOSPITAL_BASED_OUTPATIENT_CLINIC_OR_DEPARTMENT_OTHER): Payer: Self-pay

## 2021-01-21 NOTE — Telephone Encounter (Addendum)
Called and spoke with patient, she reports provider prescribed estrogen for her last March (1 1/2 years ago), she was doing well until recently when pharmacy changed their brand to a different one, it has not been effective.  Previous brand (Mylan) was effective.    Recently provider increased dose but that has not helped.  She reports she is again inflamed in her vaginal area and feeling very uncomfortable.  Pharmacy told her provider could request specific brand.  Per previous message advised [facility] pharmacy has that brand, she does not think it will be covered because she just picked up a 3 month supply.    Advised I would reach out to pharmacy to see what can be done.    [facility], [facility] report this is the only brand they have been receiving.  Will send to central refill to see if there is anything else that can be done for patient.    Regarding: frequency on urination requesting to speak with rn   ----- Message from Rhae Lerner sent at 01/21/2021  1:27 PM EDT -----  Cheryl Dixon [id number], 65 year old, female    Calls today:  Sick    What are the symptoms patient states having frequency on urination  How long has patient been sick? today  What has pt. tried at home no  Person calling on behalf of patient: Patient (self)    CALL BACK NUMBER: [phone]  Best time to call back: anytime  Cell phone:   Other phone:    Patient's language of care: English    Patient does not need an interpreter.    Patient's PCP: [name], MD

## 2021-01-22 ENCOUNTER — Telehealth (HOSPITAL_BASED_OUTPATIENT_CLINIC_OR_DEPARTMENT_OTHER): Payer: Self-pay

## 2021-01-22 NOTE — Telephone Encounter (Signed)
Arnette Schaumann, RN  [name], MD; Centralized Refill Pool Yesterday (2:33 PM)     LC    Hi central refill-Patient has been on estradiol cream, was effective until [facility]. Previously received Mylan brand which I understand [facility] carries-question is will it be covered since she recently got a refill? Anything we can do for her?   Thanks for your help

## 2021-01-22 NOTE — Telephone Encounter (Signed)
Hello,    The pharmacy can attempt lost/stolen override if unsuccessful, pt can pay 340B price which is around $23.    Thank you!

## 2021-01-23 ENCOUNTER — Other Ambulatory Visit (HOSPITAL_BASED_OUTPATIENT_CLINIC_OR_DEPARTMENT_OTHER): Payer: Self-pay | Admitting: Family Medicine

## 2021-01-23 ENCOUNTER — Other Ambulatory Visit (HOSPITAL_BASED_OUTPATIENT_CLINIC_OR_DEPARTMENT_OTHER): Payer: Self-pay | Admitting: Internal Medicine

## 2021-01-23 DIAGNOSIS — N952 Postmenopausal atrophic vaginitis: Secondary | ICD-10-CM

## 2021-01-23 DIAGNOSIS — F1120 Opioid dependence, uncomplicated: Secondary | ICD-10-CM

## 2021-01-23 MED ORDER — ALBUTEROL SULFATE HFA 108 (90 BASE) MCG/ACT IN AERS
INHALATION_SPRAY | RESPIRATORY_TRACT | 11 refills | Status: DC
Start: 2021-01-23 — End: 2021-06-28

## 2021-01-23 MED ORDER — INCRUSE ELLIPTA 62.5 MCG/INH IN AEPB
INHALATION_SPRAY | RESPIRATORY_TRACT | 11 refills | Status: DC
Start: 2021-01-23 — End: 2021-02-11

## 2021-01-23 MED ORDER — BUPROPION HCL ER (SR) 200 MG PO TB12
200.0000 mg | ORAL_TABLET | Freq: Two times a day (BID) | ORAL | 3 refills | Status: DC
Start: 2021-01-23 — End: 2022-01-17

## 2021-01-23 MED ORDER — POLYETHYLENE GLYCOL 3350 17 G PO PACK
PACK | ORAL | 2 refills | Status: DC
Start: 2021-01-23 — End: 2021-04-25

## 2021-01-23 MED ORDER — ESTRADIOL 0.1 MG/GM VA CREA
5.0000 g | TOPICAL_CREAM | VAGINAL | 1 refills | Status: DC
Start: 2021-01-23 — End: 2021-01-30

## 2021-01-23 MED ORDER — NICOTINE 21 MG/24HR TD PT24
MEDICATED_PATCH | TRANSDERMAL | 1 refills | Status: AC
Start: 2021-01-23 — End: 2021-02-20

## 2021-01-23 MED FILL — ALBUTEROL HFA INH 90 MCG: 25 days supply | Qty: 9 | Fill #0

## 2021-01-23 NOTE — Telephone Encounter (Signed)
Tried to return call to patient, no answer, left message on voicemail to call clinic back.

## 2021-01-23 NOTE — Telephone Encounter (Signed)
Please Review:    Cheryl Dixon is a 65 year old female who is transferring pharmacies and will be filling prescriptions at [facility]) going forward.    In order to continue our commitment to the highest-level of patient safety and care, [facility] pharmacy will require new prescriptions to be sent for all medications.    The prescription order(s) have been pended for your approval.  All previous prescriptions have been closed-out by their former pharmacy.    - medication(s) request: Suboxone  (Last start date 01/15/21 end date 01/29/21)  - last office visit: 01/15/2021  - last physical exam: 05/27/13      Other Med Adult:  Most Recent BP Reading(s)  12/06/20 : 117/54        Cholesterol (mg/dL)   Date Value   10/22/2020 233     LOW DENSITY LIPOPROTEIN DIRECT (mg/dL)   Date Value   10/22/2020 158     HIGH DENSITY LIPOPROTEIN (mg/dL)   Date Value   10/22/2020 40     No results found for: TG      THYROID SCREEN TSH REFLEX FT4 (uIU/mL)   Date Value   01/12/2019 1.630         No results found for: TSH    HEMOGLOBIN A1C (%)   Date Value   01/12/2019 4.6       No results found for: POCA1C      INR (no units)   Date Value   02/16/2007 1.0 (L)   07/03/2006 < 1.0 (L)       SODIUM (mmol/L)   Date Value   10/22/2020 143       POTASSIUM (mmol/L)   Date Value   10/22/2020 4.5           CREATININE (mg/dL)   Date Value   10/22/2020 0.9           Documented patient preferred pharmacies:    [place], [place], [place] [address] [place]  Phone: [phone] Fax: [phone]

## 2021-01-23 NOTE — Telephone Encounter (Signed)
Please Review:    Cheryl Dixon is a 65 year old female who is transferring pharmacies and will be filling prescriptions at [facility]) going forward.    In order to continue our commitment to the highest-level of patient safety and care, [facility] pharmacy will require new prescriptions to be sent for all medications.    The prescription order(s) have been pended for your approval.  All previous prescriptions have been closed-out by their former pharmacy.    - medication(s) request:    Estrace   Bupropion   Miralax   Nicotine Patch   Incruse   Albuterol  - last office visit: 01/15/2021  - last physical exam: 05/27/13      Other Med Adult:  Most Recent BP Reading(s)  12/06/20 : 117/54        Cholesterol (mg/dL)   Date Value   10/22/2020 233     LOW DENSITY LIPOPROTEIN DIRECT (mg/dL)   Date Value   10/22/2020 158     HIGH DENSITY LIPOPROTEIN (mg/dL)   Date Value   10/22/2020 40     No results found for: TG      THYROID SCREEN TSH REFLEX FT4 (uIU/mL)   Date Value   01/12/2019 1.630         No results found for: TSH    HEMOGLOBIN A1C (%)   Date Value   01/12/2019 4.6       No results found for: POCA1C      INR (no units)   Date Value   02/16/2007 1.0 (L)   07/03/2006 < 1.0 (L)       SODIUM (mmol/L)   Date Value   10/22/2020 143       POTASSIUM (mmol/L)   Date Value   10/22/2020 4.5           CREATININE (mg/dL)   Date Value   10/22/2020 0.9           Documented patient preferred pharmacies:    [place], [place], [place] - [place]  Phone: [phone] Fax: [phone]

## 2021-01-24 NOTE — Telephone Encounter (Signed)
Tried again to reach patient, no answer, left message on voicemail to call clinic back.

## 2021-01-25 MED FILL — NICOTINE TD DIS 21MG/24H: 28 days supply | Qty: 28 | Fill #0

## 2021-01-25 MED FILL — PEG 3350 POW: 28 days supply | Qty: 28 | Fill #0

## 2021-01-29 ENCOUNTER — Ambulatory Visit: Payer: 59 | Attending: Psychosomatic Medicine | Admitting: Clinical

## 2021-01-29 ENCOUNTER — Encounter (HOSPITAL_BASED_OUTPATIENT_CLINIC_OR_DEPARTMENT_OTHER): Payer: Self-pay

## 2021-01-29 ENCOUNTER — Ambulatory Visit: Payer: 59 | Attending: Family Medicine | Admitting: Family Medicine

## 2021-01-29 DIAGNOSIS — F332 Major depressive disorder, recurrent severe without psychotic features: Secondary | ICD-10-CM | POA: Diagnosis not present

## 2021-01-29 DIAGNOSIS — F1120 Opioid dependence, uncomplicated: Secondary | ICD-10-CM | POA: Insufficient documentation

## 2021-01-29 MED ORDER — BUPRENORPHINE HCL-NALOXONE HCL 8-2 MG SL FILM
ORAL_FILM | SUBLINGUAL | 0 refills | Status: DC
Start: 2021-01-29 — End: 2021-02-12

## 2021-01-29 MED FILL — BUPREN/NALOX MIS 8-2MG FILMS: 14 days supply | Qty: 35 | Fill #0

## 2021-01-29 NOTE — Progress Notes (Signed)
OBOT (OFFICE BASED OPIOID TREATMENT)  GROUP PROGRESS NOTE     Televisit consent, as applicable:  For the patient participating in the group via telephone and/or video technology: The patient/guardian has verbally consented to participate in this group therapy session by televisit. The patient/guardian was encouraged to be in a private location due to personal health information being discussed and where they could not be overheard by individuals not participating in the group therapy. Although all [facility] staff/providers participating in this group therapy televisit are in a private location, all the risks of telephone and/or video technology used to conduct this group therapy session cannot be controlled by [place], (i.e. interruptions, unauthorized access/recording and technical difficulties).  Each patient understands that the visit can be stopped at any time for any reason, including if the conferencing connections are inadequate.     GROUP NAME:  Office Based Opioid Treatment Group     LEADER(S): Hampton Abbot, MD, Cherlynn Perches, Ph.D., and [name], RN      Cheryl Dixon is a 65 year old, Divorced, female, who presented for [facility] group visit.   Length of group: 60 min which included 30 min of Health Behavior Intervention and OUD Psychoeducation.     Number of participants in group today: 10    Group Topic Today: Strategies to Manage Chronic Pain. Group members reviewed how chronic pain is different from acute pain. Group members discussed physical, social and psychological factors impacting chronic pain. Group members  reviewed the impact of chronic pain on recovery. Group members discussed chronic pain management strategies to support successful recovery.  Group members discussed strategies to reduce relapse risks and increase healthy coping mechanisms to support recovery and healthy change.      Purpose of Group:    Education about Opioid Use Disorder (OUD) and treatment   Enhancing adjustment and coping with  OUD   Improving management of OUD and treatment adherence   Supporting Recovery Process   Increasing health promoting behaviors   Decreasing health risk behaviors and risks of relapse   Addressing psychological/psychosocial/behavioral barriers to preventing addiction and relapse   Promotion of functional improvement     Group Interventions Utilized:   Psychoeducation related to the psychological, behavioral, and/ or psychosocial aspects of OUD   Motivational interviewing   Coping skills training   Cognitive restructuring   Emotional awareness and management   Operant behavior therapy and contingency management   Stimulus control   Mindfulness techniques training    Group Process:      Group rules    Review of group goals/structure   Group members  reviewed specific strategies to reduce chronic pain.    Group members reviewed  strategies to increase healthy coping mechanisms to support recovery and change.   Group members explored and shared additional relapse prevention strategies.    Mindfulness and self-compassion strategies discussed.   Discussed support and helpful strategies to promote recovery   Check in            Individual Patient Participation: Pt presented by video. Pt appeared to be attentive to the group discussion but did not contribute much. No relapses/close calls reported. Pt would like to follow up with a nurse privately, a team member will reach out after the group.      Diagnosis (addressed by this group): Opioid Dependence on Agonist Therapy (Opioid Use Disorder on Agonist Therapy)        Relevant changes in mental status:   No major changes reported or  observed       Plan: Pt will continue OBOT treatment     Cherlynn Perches, Ph.D.

## 2021-01-29 NOTE — BH OP Treatment Plan (Signed)
Patient/Guardian:  contributed to the creation of the treatment plan.    Strengths/Skills: Help seeking and motivated to get better     Potential Barriers: Multiple life stressors       Patient stated goals: Pt would like to prevent relapses and support the recovery process     Problem 1: Opioid Dependence on Agonist Therapy (Opioid Use Disorder on Agonist Therapy)      Short Term Goals: Pt will use at least one copying skill a day to reduce a risk of relapse, enhance coping with OUD, and support recovery process     Short Term Target:  60 days  Short TermTarget Date: 03/30/2021  Short Term Goal #1 Progress: progressing     Long Term Goals: Pt will report 50% in urges and cravings reduction and no relapses or close calls       Long Term Target:  90 days  Long TermTarget Date:  04/29/2021   Long Term Goal #1 Progress:  progressing      Intervention #1: PCBHI-based OBOT/Group Psychotherapy   Intervention Frequency:  biweekly  Intervention Duration:  90 Days  Intervention Responsibility:  Dr. Verdis Frederickson

## 2021-01-29 NOTE — Progress Notes (Signed)
Cheryl Dixon is a 65 year old female seen in follow up for opioid dependence:    Buprenorphine dose: 20mg   Response, adequacy of dose: good  Relapses/close calls: none  Trigger: denies  Meetings: none currently    Social history/events:  Doing fine.    Present Medications:  estradiol (ESTRACE VAGINAL) 0.1 MG/GM vaginal cream, Place 5 g vaginally 3 (three) times a week, Disp: 170 g, Rfl: 1  buPROPion (WELLBUTRIN SR) 200 MG 12 hr tablet, Take 1 tablet by mouth in the morning and 1 tablet before bedtime., Disp: 180 tablet, Rfl: 3  polyethylene glycol (HEALTHYLAX) 17 g packet, TAKE 17G BY MOUTH DAILY MIX IN 64 OUNCE OF CLEAR LIQUID AND TAKE as directed, Disp: 28 packet, Rfl: 2  nicotine (NICODERM CQ) 21 MG/24HR, APPLY 1 PATCH TOPICALLY DAILY, Disp: 28 patch, Rfl: 1  umeclidinium (INCRUSE ELLIPTA) 62.5 MCG/INH inhaler, INHALE 1 PUFF BY MOUTH INTO LUNGS DAILY, Disp: 1 each, Rfl: 11  albuterol HFA 108 (90 Base) MCG/ACT inhaler, INHALE 2 PUFFS INTO THE LUNGS EVERY 6 HOURS AS NEEDED FOR WHEEZING OR SHORTNESS OF BREATH, Disp: 8.5 g, Rfl: 11  buprenorphine-naloxone (SUBOXONE) 8-2 MG sublingual film, Take 2.5 films under the tongue daily., Disp: 35 Film, Rfl: 0  polyethylene glycol (GOLYTELY) 236 g suspension, Take 4,000 mLs by mouth See Admin Instructions Take as directed prior to your colonoscopy, Disp: 4000 mL, Rfl: 0  Simethicone 80 MG TABS, Take 4 tablets by mouth See Admin Instructions Take as directed prior to colonoscopy., Disp: 4 tablet, Rfl: 0  naproxen (NAPROSYN) 500 MG tablet, TAKE 1 TABLET BY MOUTH WITH FOOD EVERY TWELVE HOURS AS NEEDED FOR PAIN, Disp: 20 tablet, Rfl: 0  cholecalciferol (VITAMIN D3) 1000 UNIT tablet, Take 1,000 Units by mouth daily, Disp: , Rfl:   acetaminophen (TYLENOL) 500 MG tablet, Take 500 mg by mouth every 6 (six) hours as needed for Pain, Disp: , Rfl:   doxepin (SINEQUAN) 10 MG capsule, Take 1-2 capsules by mouth daily  for 14 days, Disp: 28 capsule, Rfl: 0  ipratropium-albuterol (DUO-NEB)  0.5-2.5 (3) MG/3ML SOLN Inhalation Solution, Take 3 mLs by nebulization 4 (four) times daily., Disp: 180 mL, Rfl: 0    No current facility-administered medications on file prior to visit.      PHQ-9 TOTAL SCORE 01/12/2019 06/01/2018 03/03/2017   Doc FlowSheet Total Score - - -   Doc FlowSheet Total Score 10 6 4            PHYSICAL EXAMINATION via video:  General appearance - healthy female in no distress  Neuro - nonfocal  Affect - normal  Behavior without evidence of intoxication or impairment    PMP Reviewed.  No unauthorized prescriptions since last refill.     Assessment:  Opioid Dependence  Comment: Stable, no updates.  Requests a nurse call-back but did not specify why. Message relayed.  Plan:  Continue buprenorphine, reviewed criteria for tapering when patient is ready. Discussed sources of support.  Patient is counseled regarding relapse prevention, involvement in recovery groups, potential side effects of buprenorphine, and eventual taper of medication.    For the patient participating in the group via telephone and/or video technology: the patient/guardian has verbally consented to participate in this group therapy session by televisit. The patient/guardian was encouraged to be in a private location due to personal health information being discussed and where they could not be overheard by individuals not participating in the group therapy. Although all [place] staff/providers participating in this group therapy televisit  are in a private location, all the risks of telephone and/or video technology used to conduct this group therapy session cannot be controlled by [facility], (i.e. interruptions, unauthorized access/recording and technical difficulties).  Each patient understands that the visit can be stopped at any time for any reason, including if the conferencing connections are inadequate.       Geni Bers, MD, 01/29/2021

## 2021-01-30 ENCOUNTER — Telehealth (HOSPITAL_BASED_OUTPATIENT_CLINIC_OR_DEPARTMENT_OTHER): Payer: Self-pay

## 2021-01-30 DIAGNOSIS — N952 Postmenopausal atrophic vaginitis: Secondary | ICD-10-CM

## 2021-01-30 MED ORDER — ESTRADIOL 0.1 MG/GM VA CREA
5.0000 g | TOPICAL_CREAM | VAGINAL | 1 refills | Status: DC
Start: 2021-01-30 — End: 2021-02-25

## 2021-01-30 NOTE — Telephone Encounter (Addendum)
Patient identification verified by 2 forms.    Call placed to patient reports when they changed her estrogen a few months ago she has started to exp sweats all over, skin getting thin again, starts to feel irritated vaginally.   Would like to go back to estradiol (mylan) brand name.   Denied any present headache, reports her sx are related to the medication.   She will be out of this medication in a month.   Would like provider to send Rx with information requesting brand names only to [facility].  Advised will send message to PCP for review and Rx if appropriate.      ----- Message from [name], MD sent at 01/29/2021  5:26 PM EDT -----  Regarding: pls triage  Pt told coordinator that she was having headache and not feeling well today on group, asked for call from a nurse.  Could you please give her a call tomorrow to triage?    Thanks

## 2021-01-31 ENCOUNTER — Ambulatory Visit (INDEPENDENT_AMBULATORY_CARE_PROVIDER_SITE_OTHER): Payer: 59 | Admitting: Family Medicine

## 2021-02-01 NOTE — Telephone Encounter (Signed)
Patient identification verified by 2 forms.    Called placed to patient relayed message below.   Patient verbalized understanding and agreed with plan.   Denied any further questions or concerns at this time.

## 2021-02-06 ENCOUNTER — Telehealth (HOSPITAL_BASED_OUTPATIENT_CLINIC_OR_DEPARTMENT_OTHER): Payer: Self-pay | Admitting: Registered Nurse

## 2021-02-06 NOTE — Telephone Encounter (Signed)
I spoke to pt  Pt running out of the estrodial  Sometimes is leaks out,   Only has 1/2 a tube left  Needs the "blue tube" as it works better than the generic.  Pt also reporting generic suboxone is not working  Pt is requesting the yellow strips- they are generic but better than what pt currently has

## 2021-02-06 NOTE — Telephone Encounter (Signed)
-----   Message from Josefa Half sent at 02/06/2021  2:13 PM EDT -----  Regarding: FW: med issues  Contact: [phone]  Hi,    Patient wants to discuss meds with a nurse.    Please advise,  Angela Nevin   ----- Message -----  From: [name]  Sent: 02/06/2021   2:08 PM EDT  To: [facility] Desk Pool  Subject: med issues                                       Cheryl Dixon [id number], 65 year old, female    Calls today:  Clinical Questions ([place])    Name of person calling patient   Specific nature of request patient would like to speak to nurse about her medication states that all medication were made generic and that they are not working please advise   Return phone number [phone]  Person calling on behalf of patient: Patient (self)        Patient's language of care: English    Patient does not need an interpreter.    Patient's PCP: [name], MD

## 2021-02-06 NOTE — Telephone Encounter (Signed)
Please ask the patient to get the brand names of the ones she wants from the pharmacy.    We can request them in prescriptions, but can't guarantee which generic she will get.  For the vaginal cream please have her book a televisit to discuss how she is using it.  She is written for a higher than usual dose and I don't think we can write higher than that.  She might need to see gyn to address this.

## 2021-02-07 MED FILL — INCRUSE ELPT INH 62.5MCG: 30 days supply | Qty: 30 | Fill #0

## 2021-02-08 NOTE — Telephone Encounter (Signed)
I spoke to pt  Informed her of message  She will try and get names of the generics  Tele booked  She will call me with the names.

## 2021-02-08 NOTE — Telephone Encounter (Addendum)
Called and advised patient that [facility] the brand she prefers (see encounter from 9/27), Rx was sent there on 10/5, she will contact pharmacy to arrange delivery.        ----- Message from [name] sent at 02/08/2021  3:15 PM EDT -----  Regarding: requesting to speak to RN  Contact: [phone]  Chelsei Mcchesney [id number], 65 year old, female    Calls today:  Clinical Questions ([place])    Name of person calling patient   Specific nature of request patient would like to speak RN patty states they were speaking in regards to her medication and that they were switched to generic the pharmacy would not give her the names   Return phone number [phone]  Person calling on behalf of patient: Patient (self)        Patient's language of care: English    Patient does not need an interpreter.    Patient's PCP: [name], MD

## 2021-02-11 ENCOUNTER — Other Ambulatory Visit (HOSPITAL_BASED_OUTPATIENT_CLINIC_OR_DEPARTMENT_OTHER): Payer: Self-pay | Admitting: Internal Medicine

## 2021-02-11 DIAGNOSIS — F1120 Opioid dependence, uncomplicated: Secondary | ICD-10-CM

## 2021-02-11 NOTE — Telephone Encounter (Signed)
PER Pharmacy, Carel Schnee is a 65 year old female has requested a refill of      -  Incruse ellipta       Last Office Visit: 01/29/2021 with [facility]   Last Physical Exam: 05/27/2013     There are no preventive care reminders to display for this patient.     Other Med Adult:  Most Recent BP Reading(s)  12/06/20 : 117/54        Cholesterol (mg/dL)   Date Value   10/22/2020 233     LOW DENSITY LIPOPROTEIN DIRECT (mg/dL)   Date Value   10/22/2020 158     HIGH DENSITY LIPOPROTEIN (mg/dL)   Date Value   10/22/2020 40     No results found for: TG      THYROID SCREEN TSH REFLEX FT4 (uIU/mL)   Date Value   01/12/2019 1.630         No results found for: TSH    HEMOGLOBIN A1C (%)   Date Value   01/12/2019 4.6       No results found for: POCA1C      INR (no units)   Date Value   02/16/2007 1.0 (L)   07/03/2006 < 1.0 (L)       SODIUM (mmol/L)   Date Value   10/22/2020 143       POTASSIUM (mmol/L)   Date Value   10/22/2020 4.5           CREATININE (mg/dL)   Date Value   10/22/2020 0.9        Documented patient preferred pharmacies:    [facility], [place], [place] - [place]  Phone: [phone] Fax: [phone]    [facility], [place] - [address]  Phone: [phone] Fax: [phone]    [id number] - [place], [place]  Phone: [phone] Fax: [phone]

## 2021-02-12 ENCOUNTER — Ambulatory Visit: Payer: 59 | Attending: Psychosomatic Medicine | Admitting: Clinical

## 2021-02-12 ENCOUNTER — Ambulatory Visit: Payer: 59 | Attending: Family Medicine | Admitting: Family Medicine

## 2021-02-12 ENCOUNTER — Ambulatory Visit: Payer: Self-pay

## 2021-02-12 ENCOUNTER — Other Ambulatory Visit: Payer: Self-pay

## 2021-02-12 ENCOUNTER — Encounter (INDEPENDENT_AMBULATORY_CARE_PROVIDER_SITE_OTHER): Payer: Self-pay | Admitting: Family Medicine

## 2021-02-12 ENCOUNTER — Ambulatory Visit (INDEPENDENT_AMBULATORY_CARE_PROVIDER_SITE_OTHER): Payer: 59 | Admitting: Family Medicine

## 2021-02-12 ENCOUNTER — Ambulatory Visit: Payer: 59 | Admitting: Orthopaedic Surgery

## 2021-02-12 VITALS — BP 170/81 | HR 100 | Temp 97.8°F | Ht <= 58 in | Wt 186.0 lb

## 2021-02-12 VITALS — Ht 58.75 in | Wt 188.0 lb

## 2021-02-12 DIAGNOSIS — F1120 Opioid dependence, uncomplicated: Secondary | ICD-10-CM | POA: Insufficient documentation

## 2021-02-12 DIAGNOSIS — G8929 Other chronic pain: Secondary | ICD-10-CM | POA: Diagnosis not present

## 2021-02-12 DIAGNOSIS — E1169 Type 2 diabetes mellitus with other specified complication: Secondary | ICD-10-CM

## 2021-02-12 DIAGNOSIS — I10 Essential (primary) hypertension: Secondary | ICD-10-CM

## 2021-02-12 DIAGNOSIS — Z6841 Body Mass Index (BMI) 40.0 and over, adult: Secondary | ICD-10-CM | POA: Diagnosis not present

## 2021-02-12 DIAGNOSIS — Z9189 Other specified personal risk factors, not elsewhere classified: Secondary | ICD-10-CM

## 2021-02-12 DIAGNOSIS — M545 Low back pain, unspecified: Secondary | ICD-10-CM

## 2021-02-12 MED ORDER — BUPRENORPHINE HCL-NALOXONE HCL 8-2 MG SL FILM
ORAL_FILM | SUBLINGUAL | 0 refills | Status: DC
Start: 2021-02-12 — End: 2021-02-26

## 2021-02-12 NOTE — Progress Notes (Signed)
Cheryl Dixon is a 65 year old female seen in follow up for opioid dependence:    Buprenorphine dose: 20mg   Response, adequacy of dose: good  Relapses/close calls: none  Trigger: denies  Meetings: none currently    Social history/events:  Had bad experience with the suboxone films she received.  Says they were black this time, didn't work well and she felt withdrawal.    Also expressed frustration with nursing and communication.    Present Medications:  INCRUSE ELLIPTA 62.5 MCG/INH inhaler, INHALE 1 PUFF BY MOUTH INTO LUNGS DAILY, Disp: 30 each, Rfl: 11  estradiol (ESTRACE VAGINAL) 0.1 MG/GM vaginal cream, Place 5 g vaginally 3 (three) times a week, Disp: 170 g, Rfl: 1  buprenorphine-naloxone (SUBOXONE) 8-2 MG sublingual film, Take 2.5 films under the tongue daily., Disp: 35 Film, Rfl: 0  buPROPion (WELLBUTRIN SR) 200 MG 12 hr tablet, Take 1 tablet by mouth in the morning and 1 tablet before bedtime., Disp: 180 tablet, Rfl: 3  polyethylene glycol (HEALTHYLAX) 17 g packet, TAKE 17G BY MOUTH DAILY MIX IN 64 OUNCE OF CLEAR LIQUID AND TAKE as directed, Disp: 28 packet, Rfl: 2  nicotine (NICODERM CQ) 21 MG/24HR, APPLY 1 PATCH TOPICALLY DAILY, Disp: 28 patch, Rfl: 1  albuterol HFA 108 (90 Base) MCG/ACT inhaler, INHALE 2 PUFFS INTO THE LUNGS EVERY 6 HOURS AS NEEDED FOR WHEEZING OR SHORTNESS OF BREATH, Disp: 8.5 g, Rfl: 11  polyethylene glycol (GOLYTELY) 236 g suspension, Take 4,000 mLs by mouth See Admin Instructions Take as directed prior to your colonoscopy, Disp: 4000 mL, Rfl: 0  Simethicone 80 MG TABS, Take 4 tablets by mouth See Admin Instructions Take as directed prior to colonoscopy., Disp: 4 tablet, Rfl: 0  naproxen (NAPROSYN) 500 MG tablet, TAKE 1 TABLET BY MOUTH WITH FOOD EVERY TWELVE HOURS AS NEEDED FOR PAIN, Disp: 20 tablet, Rfl: 0  cholecalciferol (VITAMIN D3) 1000 UNIT tablet, Take 1,000 Units by mouth daily, Disp: , Rfl:   acetaminophen (TYLENOL) 500 MG tablet, Take 500 mg by mouth every 6 (six) hours as  needed for Pain, Disp: , Rfl:   doxepin (SINEQUAN) 10 MG capsule, Take 1-2 capsules by mouth daily  for 14 days, Disp: 28 capsule, Rfl: 0  ipratropium-albuterol (DUO-NEB) 0.5-2.5 (3) MG/3ML SOLN Inhalation Solution, Take 3 mLs by nebulization 4 (four) times daily., Disp: 180 mL, Rfl: 0    No current facility-administered medications on file prior to visit.      PHQ-9 TOTAL SCORE 01/12/2019 06/01/2018 03/03/2017   Doc FlowSheet Total Score - - -   Doc FlowSheet Total Score 10 6 4            PHYSICAL EXAMINATION via video:  General appearance - healthy female in no distress  Neuro - nonfocal  Affect - normal  Behavior without evidence of intoxication or impairment    PMP Reviewed.  No unauthorized prescriptions since last refill.     Assessment:  Opioid Dependence  Comment: Sending films to her desired pharmacy, which she thinks will have the correct film for her.  With regard to her frustration with the clinic, we will continue to support boundaries and remind her about proper ways to seek care for her non-suboxone related care.  Plan:  Continue buprenorphine, reviewed criteria for tapering when patient is ready. Discussed sources of support.  Patient is counseled regarding relapse prevention, involvement in recovery groups, potential side effects of buprenorphine, and eventual taper of medication.    For the patient participating in the group via  telephone and/or video technology: the patient/guardian has verbally consented to participate in this group therapy session by televisit. The patient/guardian was encouraged to be in a private location due to personal health information being discussed and where they could not be overheard by individuals not participating in the group therapy. Although all [facility] staff/providers participating in this group therapy televisit are in a private location, all the risks of telephone and/or video technology used to conduct this group therapy session cannot be controlled by [facility], (i.e.  interruptions, unauthorized access/recording and technical difficulties).  Each patient understands that the visit can be stopped at any time for any reason, including if the conferencing connections are inadequate.       Geni Bers, MD, 02/12/2021

## 2021-02-12 NOTE — Progress Notes (Deleted)
Isk heart dia

## 2021-02-12 NOTE — Progress Notes (Signed)
Office Visit Note   Patient: Michelle Roberts           Date of Birth: 1955/07/17           MRN: [id number] Visit Date: 02/12/2021              Requested by: Bryson Corona, NP [phone] [place],  [place] [address] PCP: Bryson Corona, NP   Assessment & Plan: Visit Diagnoses:  1. Chronic bilateral low back pain, unspecified whether sciatica present     Plan: Impression is chronic bilateral low back pain.  At this point, the patient has tried medications and physical therapy without relief of symptoms.  I would like to go ahead and order an MRI of the lumbar spine to further assess for structural abnormalities.  She will follow-up with [place] once has been completed.  Follow-Up Instructions: Return for after MRI.   Orders:  Orders Placed This Encounter  Procedures   XR Lumbar Spine 2-3 Views   No orders of the defined types were placed in this encounter.     Procedures: No procedures performed   Clinical Data: No additional findings.   Subjective: Chief Complaint  Patient presents with   Lower Back - Pain    HPI patient is a pleasant 65 year old female who comes in today with chronic bilateral low back pain radiating to both buttocks and down the back of both legs.  She has been dealing with this for a while.  Pain is worse with standing for long period of time.  She has been using a back brace, lidocaine patches and has been to physical therapy all without relief of symptoms.  She has been taking Mobic and Robaxin which minimally helps.  She denies any paresthesias down either lower extremity.  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  Review of Systems as detailed in HPI.  All others reviewed and are negative.   Objective: Vital Signs: Ht 4' 10.75" (1.492 m)   Wt 188 lb (85.3 kg)   BMI 38.30 kg/m   Physical Exam requested  well-nourished female in no acute distress.  Alert and oriented x3.  Ortho Exam examination of the lumbar spine shows no spinous or paraspinous tenderness.  Negative straight leg raise both sides.  She does have increased pain with lumbar extension.  No focal weakness.  She is neurovascular tact distally.  Specialty Comments:  No specialty comments available.  Imaging: No results found.   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   Fibromyalgia 08/12/2016   High risk medication use 08/12/2016   Primary osteoarthritis of both hands 08/12/2016   Acute midline low back pain 08/12/2016   DDD (degenerative disc disease), lumbar 08/12/2016   History of hypertension 08/12/2016   History of high cholesterol 08/12/2016   Thyroid ca ([facility]) 08/12/2016   History of hypothyroidism 08/12/2016   History of gastroesophageal reflux (GERD) 08/12/2016   History of TMJ syndrome 08/12/2016   Burning tongue syndrome 08/12/2016   History of vitamin D deficiency 08/12/2016   Other sleep apnea 08/12/2016   Vitamin D deficiency 08/12/2016   Rheumatoid arthritis with rheumatoid factor of multiple sites without organ or systems involvement ([place]) 12/31/2015   Goals  of care, counseling/discussion 06/02/2013   Myofascial muscle pain 06/02/2013   Narcotic-induced mood disorder ([place]) 06/02/2013   Inflammatory arthritis 04/08/2011   Plantar fasciitis 04/08/2011   Yeast infection 04/08/2011   Past Medical History:  Diagnosis Date   Anemia    Anxiety    Arthritis    Asthma    Back pain    Cancer ([place])    Depression    Fibromyalgia    Gallbladder problem    GERD (gastroesophageal reflux disease)    Hyperlipidemia    Hypertension    Hypothyroidism    thyroidectomy   Joint pain    Morton neuroma, right    per patient    Neoplasm of parotid gland    Neuromuscular  disorder ([place])    Osteoarthritis    Palpitation    Prediabetes    Rheumatoid aortitis    Sleep apnea    uses CPAP sometimes   Swelling of lower extremity    Vitamin D deficiency     Family History  Problem Relation Age of Onset   Alzheimer's disease Mother    Hypertension Mother    Hyperlipidemia Mother    Alzheimer's disease Sister    Diabetes Brother    High Cholesterol Brother    Hypertension Brother    Diabetes Sister    Hypertension Sister    High Cholesterol Sister    Diabetes Sister    Hypertension Sister    High Cholesterol Sister    Diabetes Brother    Hypertension Brother    High Cholesterol Brother    Healthy Daughter    Healthy Daughter     Past Surgical History:  Procedure Laterality Date   ABDOMINAL HYSTERECTOMY     CHOLECYSTECTOMY     HAMMER TOE SURGERY     MASS EXCISION N/A 11/17/2017   Procedure: EXCISION SOFT PALATE LESION;  Surgeon: Rozetta Nunnery, MD;  Location: [facility];  Service: ENT;  Laterality: N/A;   PAROTIDECTOMY Left 04/08/2016   Procedure: LEFT SUPERFICIAL PAROTIDECTOMY WITH FACIAL NERVE DISECTION;  Surgeon: Rozetta Nunnery, MD;  Location: [facility];  Service: ENT;  Laterality: Left;   TARSAL METATARSAL ARTHRODESIS Right 02/03/2018   Procedure: RIGHT GREAT TOE  FUSION;  Surgeon: Leandrew Koyanagi, MD;  Location: [facility];  Service: Orthopedics;  Laterality: Right;   THYROIDECTOMY     Social History   Occupational History   Not on file  Tobacco Use   Smoking status: Never   Smokeless tobacco: Never  Vaping Use   Vaping Use: Never used  Substance and Sexual Activity   Alcohol use: No   Drug use: No   Sexual activity: Not on file

## 2021-02-12 NOTE — Progress Notes (Signed)
Chief Complaint:   OBESITY Michelle Roberts is here to discuss her progress with her obesity treatment plan along with follow-up of her obesity related diagnoses. Michelle Roberts is on the [employer] and states she is following her eating plan approximately 30% of the time. Michelle Roberts states she is doing 0 minutes 0 times per week.  Today's visit was #: 7 Starting weight: 202 lbs Starting date: 06/22/2020 Today's weight: 186 lbs Today's date: 02/12/2021 Total lbs lost to date: 16 Total lbs lost since last in-office visit: 0  Interim History: Michelle Roberts has had a lot of stressors and health issues and she has gotten a bit off track of her eating plan. Her protein has decreased and this is likely decreasing her RMR.  Subjective:   1. Type 2 diabetes mellitus with other specified complication, without long-term current use of insulin (Michelle Roberts) Michelle Roberts just started [facility] this well at 0.25 mg, and she denies nausea or vomiting. She has been working on weight loss to help control her diabetes mellitus. Her recent A1c was 6.1. I discussed labs with the patient today.  2. Essential hypertension Michelle Roberts's blood pressure is elevated today. She states she has a history of white coat syndrome.  3. At risk for heart disease Michelle Roberts is at a higher than average risk for cardiovascular disease due to obesity.   Assessment/Plan:   1. Type 2 diabetes mellitus with other specified complication, without long-term current use of insulin (Michelle Roberts) Michelle Roberts will continue [facility] and wait to increase dose until we see how she does on a full 4 doses. We will continue to manage her medications. Good blood sugar control is important to decrease the likelihood of diabetic complications such as nephropathy, neuropathy, limb loss, blindness, coronary artery disease, and death. Intensive lifestyle modification including diet, exercise and weight loss are the first line of treatment for diabetes.   2. Essential hypertension Michelle Roberts is to continue  to work on diet and weight loss, and she is to follow up with her primary care provider to monitor her blood pressure. She will watch for signs of hypotension as she continues her lifestyle modifications.  3. At risk for heart disease Michelle Roberts was given approximately 15 minutes of coronary artery disease prevention counseling today. She is 65 y.o. female and has risk factors for heart disease including obesity. We discussed intensive lifestyle modifications today with an emphasis on specific weight loss instructions and strategies.   Repetitive spaced learning was employed today to elicit superior memory formation and behavioral change.  4. Obesity with current BMI 38.9 Michelle Roberts is currently in the action stage of change. As such, her goal is to continue with weight loss efforts. She has agreed to the [employer].   Behavioral modification strategies: increasing lean protein intake and dealing with family or coworker sabotage.  Michelle Roberts has agreed to follow-up with our clinic in 3 to 4 weeks. She was informed of the importance of frequent follow-up visits to maximize her success with intensive lifestyle modifications for her multiple health conditions.   Objective:   Blood pressure (!) 170/81, pulse 100, temperature 97.8 F (36.6 C), height 4\' 10"  (1.473 m), weight 186 lb (84.4 kg), SpO2 99 %. Body mass index is 38.87 kg/m.  General: Cooperative, alert, well developed, in no acute distress. HEENT: Conjunctivae and lids unremarkable. Cardiovascular: Regular rhythm.  Lungs: Normal work of breathing. Neurologic: No focal deficits.   Lab Results  Component Value Date   CREATININE 1.0 10/04/2020   BUN 22 (A) 10/04/2020  NA 140 09/19/2020   K 4.3 09/19/2020   CL 95 (L) 09/19/2020   CO2 24 09/19/2020   Lab Results  Component Value Date   ALT 22 10/04/2020   AST 23 10/04/2020   ALKPHOS 73 10/04/2020   BILITOT 0.5 09/19/2020   Lab Results  Component Value Date   HGBA1C 6.7 (H)  09/19/2020   Lab Results  Component Value Date   INSULIN 9.3 09/19/2020   Lab Results  Component Value Date   TSH 0.07 (A) 10/04/2020   Lab Results  Component Value Date   CHOL 232 (H) 09/19/2020   HDL 95 09/19/2020   LDLCALC 123 (H) 09/19/2020   TRIG 85 09/19/2020   Lab Results  Component Value Date   VD25OH 56.1 09/19/2020   VD25OH 46.7 05/01/2015   Lab Results  Component Value Date   WBC 8.9 10/04/2020   HGB 13.3 10/04/2020   HCT 42 10/04/2020   MCV 80 09/19/2020   PLT 252 10/04/2020   Lab Results  Component Value Date   FERRITIN 456 10/04/2020   Attestation Statements:   Reviewed by clinician on day of visit: allergies, medications, problem list, medical history, surgical history, family history, social history, and previous encounter notes.   I, Trixie Dredge, am acting as transcriptionist for Dennard Nip, MD.  I have reviewed the above documentation for accuracy and completeness, and I agree with the above. -  Dennard Nip, MD

## 2021-02-14 NOTE — Progress Notes (Signed)
OBOT (OFFICE BASED OPIOID TREATMENT)  GROUP PROGRESS NOTE     Televisit consent, as applicable:  For the patient participating in the group via telephone and/or video technology: The patient/guardian has verbally consented to participate in this group therapy session by televisit. The patient/guardian was encouraged to be in a private location due to personal health information being discussed and where they could not be overheard by individuals not participating in the group therapy. Although all [facility] staff/providers participating in this group therapy televisit are in a private location, all the risks of telephone and/or video technology used to conduct this group therapy session cannot be controlled by [place], (i.e. interruptions, unauthorized access/recording and technical difficulties).  Each patient understands that the visit can be stopped at any time for any reason, including if the conferencing connections are inadequate.     GROUP NAME:  Office Based Opioid Treatment Group     LEADER(S): Hampton Abbot, MD, Cherlynn Perches, Ph.D., and [name], RN      Cheryl Dixon is a 65 year old, Divorced, female, who presented for [facility] group visit.   Length of group: 60 min which included 30 min of Health Behavior Intervention and OUD Psychoeducation.     Number of participants in group today: 6    Group Topic Today: continued to discuss Strategies to Manage Chronic Pain. Group members reviewed examples of how chronic pain is different from acute pain. Group members discussed physical, social and psychological factors impacting chronic pain. Concepts of challenging of unhelpful thoughts, pacing, and social support introduced. Group members  reviewed the impact of chronic pain on recovery. Group members discussed chronic pain management strategies to support successful recovery.  Group members discussed strategies to reduce relapse risks and increase healthy coping mechanisms to support recovery and healthy  change.      Purpose of Group:    Education about Opioid Use Disorder (OUD) and treatment   Enhancing adjustment and coping with OUD   Improving management of OUD and treatment adherence   Supporting Recovery Process   Increasing health promoting behaviors   Decreasing health risk behaviors and risks of relapse   Addressing psychological/psychosocial/behavioral barriers to preventing addiction and relapse   Promotion of functional improvement     Group Interventions Utilized:   Psychoeducation related to the psychological, behavioral, and/ or psychosocial aspects of OUD   Motivational interviewing   Coping skills training   Cognitive restructuring   Emotional awareness and management   Operant behavior therapy and contingency management   Stimulus control   Mindfulness techniques training    Group Process:      Group rules    Review of group goals/structure   Group members  reviewed specific strategies to reduce chronic pain.    Group members reviewed  strategies to increase healthy coping mechanisms to support recovery and change.   Group members explored and shared additional relapse prevention strategies.    Mindfulness and self-compassion strategies discussed.   Discussed support and helpful strategies to promote recovery   Check in            Individual Patient Participation: Pt presented by video. Pt appeared angry and irritable. She reports "having a problem with generic Suboxone". A PCP nurse will follow up with pt after the group. No relapses/close calls reported.    Diagnosis (addressed by this group):   Pt's symptoms and presentation appear most consistent with diagnostic criteria for   Opioid dependence on agonist therapy ([place])  Relevant changes in mental status:   No major changes reported or observed       Plan: Pt will continue OBOT treatment     Cherlynn Perches, Ph.D.

## 2021-02-19 ENCOUNTER — Ambulatory Visit: Payer: 59 | Attending: Internal Medicine | Admitting: Internal Medicine

## 2021-02-19 DIAGNOSIS — N952 Postmenopausal atrophic vaginitis: Secondary | ICD-10-CM | POA: Insufficient documentation

## 2021-02-19 DIAGNOSIS — R102 Pelvic and perineal pain unspecified side: Secondary | ICD-10-CM

## 2021-02-19 DIAGNOSIS — I7123 Aneurysm of the descending thoracic aorta, without rupture: Secondary | ICD-10-CM | POA: Diagnosis not present

## 2021-02-19 DIAGNOSIS — M810 Age-related osteoporosis without current pathological fracture: Secondary | ICD-10-CM | POA: Diagnosis present

## 2021-02-19 DIAGNOSIS — J449 Chronic obstructive pulmonary disease, unspecified: Secondary | ICD-10-CM | POA: Insufficient documentation

## 2021-02-19 NOTE — Progress Notes (Signed)
Subjective     Cheryl Dixon is a 65 year old female seen for:     Topical estrogen was started about 18 months ago for suprapubic pain and urinary urgency attributed to atrophic vaginitis.  Four months ago she saw Dr. Jamal Collin for crampy LLQ pain and urinary frequency.  UA + nitrite, neg LE.  Treated empirically for cystitis.  Estrogen was helping with vaginitis, although this is not working as well since she was switched to a different brand, and she is having some vaginal irritation and pelvic pain.  Mylan brand tends to work well, but worse   Therapy has helped her mood and helped her skin, helped her to work out.  Helped her with body aches and not to be so moody and with night sweats.  Had endometriosis, symptoms were constant pelvic cramps, had about 10 surgeries, after hysterectomy in her 50s, initially just uterus but eventually the ovaries as well, was on systemic estrogen therapy for 3-4 years, but then it was discontinued later in her 47s.     Chronic pain  Was taking extra suboxone for pain when she had another "generic" suboxone.    COPD  Taking incruse without noted side effects.  Using albuterol rare.  Using duoneb rare.  Smoking 3-4 cigarettes per week, mostly socially.    TAA  Will call to schedule follow up with thoracics.    Social History    Tobacco Use      Smoking status: Current Some Day Smoker        Packs/day: 1.00        Years: 20.00        Pack years: 20        Types: Cigarettes      Smokeless tobacco: Never Used      Tobacco comment: quit 1980-1996, then light until 2003, then 1 ppd since    Alcohol use: No    Drug use: Yes      Types: Marijuana      Comment: past opioids, nasal heroin, 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  MJ 1x per week    Patient Active Problem List:     Opioid dependence on agonist therapy (HCC)     Tobacco use disorder     Fibrocystic breast     Elevated BP without diagnosis of hypertension     Knee pain     Chronic low back pain     OA (osteoarthritis) of knee     H. pylori infection      Major depressive disorder, recurrent episode, severe (HCC)     Occult blood in stools     Prediabetes     Epigastric pain     Chronic obstructive pulmonary disease (HCC)     Left foot pain     Adenomatous polyp of colon     Acquired deformity of toe, right     Dislocation of metatarsophalangeal joint of lesser toe, right, sequela     Postmenopausal atrophic vaginitis     Abnormal loss of weight     Thoracic aortic aneurysm without rupture     COVID-19 virus infection     BMI 32.0-32.9,adult     Dysuria     LLQ pain    buprenorphine-naloxone (SUBOXONE) 8-2 MG sublingual film, Take 2.5 films under the tongue daily., Disp: 35 Film, Rfl: 0  INCRUSE ELLIPTA 62.5 MCG/INH inhaler, INHALE 1 PUFF BY MOUTH INTO LUNGS DAILY, Disp: 30 each, Rfl: 11  estradiol (ESTRACE VAGINAL) 0.1 MG/GM vaginal cream,  Place 5 g vaginally 3 (three) times a week, Disp: 170 g, Rfl: 1  buPROPion (WELLBUTRIN SR) 200 MG 12 hr tablet, Take 1 tablet by mouth in the morning and 1 tablet before bedtime., Disp: 180 tablet, Rfl: 3  polyethylene glycol (HEALTHYLAX) 17 g packet, TAKE 17G BY MOUTH DAILY MIX IN 64 OUNCE OF CLEAR LIQUID AND TAKE as directed, Disp: 28 packet, Rfl: 2  nicotine (NICODERM CQ) 21 MG/24HR, APPLY 1 PATCH TOPICALLY DAILY, Disp: 28 patch, Rfl: 1  albuterol HFA 108 (90 Base) MCG/ACT inhaler, INHALE 2 PUFFS INTO THE LUNGS EVERY 6 HOURS AS NEEDED FOR WHEEZING OR SHORTNESS OF BREATH, Disp: 8.5 g, Rfl: 11  polyethylene glycol (GOLYTELY) 236 g suspension, Take 4,000 mLs by mouth See Admin Instructions Take as directed prior to your colonoscopy, Disp: 4000 mL, Rfl: 0  Simethicone 80 MG TABS, Take 4 tablets by mouth See Admin Instructions Take as directed prior to colonoscopy., Disp: 4 tablet, Rfl: 0  naproxen (NAPROSYN) 500 MG tablet, TAKE 1 TABLET BY MOUTH WITH FOOD EVERY TWELVE HOURS AS NEEDED FOR PAIN, Disp: 20 tablet, Rfl: 0  cholecalciferol (VITAMIN D3) 1000 UNIT tablet, Take 1,000 Units by mouth daily, Disp: , Rfl:   acetaminophen  (TYLENOL) 500 MG tablet, Take 500 mg by mouth every 6 (six) hours as needed for Pain, Disp: , Rfl:   doxepin (SINEQUAN) 10 MG capsule, Take 1-2 capsules by mouth daily  for 14 days, Disp: 28 capsule, Rfl: 0  ipratropium-albuterol (DUO-NEB) 0.5-2.5 (3) MG/3ML SOLN Inhalation Solution, Take 3 mLs by nebulization 4 (four) times daily., Disp: 180 mL, Rfl: 0    No current facility-administered medications for this visit.    Review of Patient's Allergies indicates:   Darvon                     Meperidine hcl              Comment:Nausea/vomit   Paxil [paroxetine]      Rash   Zoloft [sertraline *    Rash            Objective     LMP 11/20/1991   No physical exam done on this virtual visit during the [place] pandemic  Patient was unable to connect to video via [place] link  On telephone, patient is alert and in no distress  Speech is clear  Speaking in full sentences  Thought content is normal              Plan   Vaginal dryness, mood disturbance  Vaginal estrogens were originally prescribed for management of vaginal atrophy.  Patient has been using high doses of vaginal estrogen therapy, and after discussion today, it appears that the main she is doing it this is the beneficial effects it is having on mood.  We discussed that there are risks associated with doing this, especially sine she is greater than 60 and smokes.  The 10-year ASCVD risk score Mikey Bussing DC Jr., et al., 2013) is: 9.5%  We discussed whether she might be a candidate for oral or transdermal estrogen therapy.  However because she is over 87 and is a smoker and hasn't had a mammogram in 12 years, I think the risks outweigh the benefits. In addition, there may be some risk of reactiving endometriosis symptoms with higher estrogen doses.  Plan:  - continue to use topical estrogen for now, but do not overuse to get systemic benefits on mood, hot flashes, etc, as higher  doses carry significant risks in light of her age & smoking status.  - referred gynecology to  discuss the vaginal bulge she is feeling as well as alternative treatments for vaginal discomfort.  - patient agrees to get her mammogram as scheduled this month  - patient agrees to limit dose of estrogen and also to discuss alternative therapies for mood and hot flashes at a follow up appt (SSRIs, etc)    COPD  Well controlled on current medications    We discussed the patients current medications. The patient expressed understanding and no barriers to adherence were identified.  1. The patient indicates understanding of these issues and agrees with the plan.  Brief care plan is updated and reviewed with the patient.   2. The patient is given an After Visit Summary sheet that lists all medications with directions, allergies, orders placed during this encounter, and follow-up instructions.   3. I reviewed the patient's medical information and medical history   4. I reconciled the patient's medication list and prepared and supplied needed refills.   5. I have reviewed the past medical, family, and social history sections including the medications and allergies.    [name], MD

## 2021-02-20 ENCOUNTER — Telehealth: Payer: Self-pay | Admitting: Orthopaedic Surgery

## 2021-02-20 NOTE — Telephone Encounter (Signed)
Pt called stating she has an MRI on 03/02/21 and she's wanting to know if she could get a call with the results? She states she discussed being referred to Dr.Newton depending on the results and she would like to have the referral sent in after the CB to discuss the results if necessary. Pt would like a CB to let her know if this will be possible.  [phone]

## 2021-02-21 NOTE — Telephone Encounter (Signed)
Ok for this? 

## 2021-02-21 NOTE — Telephone Encounter (Signed)
See message. thanks 

## 2021-02-21 NOTE — Telephone Encounter (Signed)
Patient will call us or send [place] mychart msg to remind [place].

## 2021-02-25 ENCOUNTER — Encounter (HOSPITAL_BASED_OUTPATIENT_CLINIC_OR_DEPARTMENT_OTHER): Payer: Self-pay | Admitting: Internal Medicine

## 2021-02-25 MED ORDER — ESTRADIOL 0.1 MG/GM VA CREA
5.0000 g | TOPICAL_CREAM | VAGINAL | 1 refills | Status: DC
Start: 2021-02-25 — End: 2021-07-17

## 2021-02-26 ENCOUNTER — Ambulatory Visit: Payer: 59 | Attending: Family Medicine | Admitting: Family Medicine

## 2021-02-26 ENCOUNTER — Ambulatory Visit: Payer: 59 | Attending: Psychosomatic Medicine | Admitting: Clinical

## 2021-02-26 DIAGNOSIS — F1120 Opioid dependence, uncomplicated: Secondary | ICD-10-CM | POA: Insufficient documentation

## 2021-02-26 DIAGNOSIS — F332 Major depressive disorder, recurrent severe without psychotic features: Secondary | ICD-10-CM | POA: Insufficient documentation

## 2021-02-26 MED ORDER — BUPRENORPHINE HCL-NALOXONE HCL 8-2 MG SL FILM
ORAL_FILM | SUBLINGUAL | 0 refills | Status: DC
Start: 2021-02-26 — End: 2021-02-26

## 2021-02-26 MED ORDER — BUPRENORPHINE HCL-NALOXONE HCL 8-2 MG SL FILM
ORAL_FILM | SUBLINGUAL | 0 refills | Status: DC
Start: 2021-02-26 — End: 2021-03-12

## 2021-02-26 MED FILL — POLYETH GLYC POW 3350 NF (PACKETS): 28 days supply | Qty: 28 | Fill #1

## 2021-02-26 MED FILL — BUPREN/NALOX MIS 8-2MG FILMS: 14 days supply | Qty: 35 | Fill #0

## 2021-02-26 NOTE — Progress Notes (Signed)
Cheryl Dixon is a 65 year old female seen in follow up for opioid dependence:    Buprenorphine dose: 20mg   Response, adequacy of dose: good  Relapses/close calls: none  Trigger: denies  Meetings: none currently    Social history/events:  Present in group but did not participate.      Present Medications:  estradiol (ESTRACE VAGINAL) 0.1 MG/GM vaginal cream, Place 5 g vaginally 3 (three) times a week Mylan brand only., Disp: 170 g, Rfl: 1  buprenorphine-naloxone (SUBOXONE) 8-2 MG sublingual film, Take 2.5 films under the tongue daily., Disp: 35 Film, Rfl: 0  INCRUSE ELLIPTA 62.5 MCG/INH inhaler, INHALE 1 PUFF BY MOUTH INTO LUNGS DAILY, Disp: 30 each, Rfl: 11  buPROPion (WELLBUTRIN SR) 200 MG 12 hr tablet, Take 1 tablet by mouth in the morning and 1 tablet before bedtime., Disp: 180 tablet, Rfl: 3  polyethylene glycol (HEALTHYLAX) 17 g packet, TAKE 17G BY MOUTH DAILY MIX IN 64 OUNCE OF CLEAR LIQUID AND TAKE as directed, Disp: 28 packet, Rfl: 2  albuterol HFA 108 (90 Base) MCG/ACT inhaler, INHALE 2 PUFFS INTO THE LUNGS EVERY 6 HOURS AS NEEDED FOR WHEEZING OR SHORTNESS OF BREATH, Disp: 8.5 g, Rfl: 11  polyethylene glycol (GOLYTELY) 236 g suspension, Take 4,000 mLs by mouth See Admin Instructions Take as directed prior to your colonoscopy, Disp: 4000 mL, Rfl: 0  Simethicone 80 MG TABS, Take 4 tablets by mouth See Admin Instructions Take as directed prior to colonoscopy., Disp: 4 tablet, Rfl: 0  naproxen (NAPROSYN) 500 MG tablet, TAKE 1 TABLET BY MOUTH WITH FOOD EVERY TWELVE HOURS AS NEEDED FOR PAIN, Disp: 20 tablet, Rfl: 0  cholecalciferol (VITAMIN D3) 1000 UNIT tablet, Take 1,000 Units by mouth daily, Disp: , Rfl:   acetaminophen (TYLENOL) 500 MG tablet, Take 500 mg by mouth every 6 (six) hours as needed for Pain, Disp: , Rfl:   ipratropium-albuterol (DUO-NEB) 0.5-2.5 (3) MG/3ML SOLN Inhalation Solution, Take 3 mLs by nebulization 4 (four) times daily., Disp: 180 mL, Rfl: 0    No current facility-administered medications  on file prior to visit.      PHQ-9 TOTAL SCORE 01/12/2019 06/01/2018 03/03/2017   Doc FlowSheet Total Score - - -   Doc FlowSheet Total Score 10 6 4            PHYSICAL EXAMINATION:  Examination deferred because of COVID-19 pandemic    PMP Reviewed.  No unauthorized prescriptions since last refill.     Assessment:  Opioid Dependence  Comment: Reached out to [place], stable.  Plan:  Continue buprenorphine, reviewed criteria for tapering when patient is ready. Discussed sources of support.  Patient is counseled regarding relapse prevention, involvement in recovery groups, potential side effects of buprenorphine, and eventual taper of medication.    Geni Bers, MD, 02/26/2021

## 2021-02-26 NOTE — Progress Notes (Signed)
OBOT (OFFICE BASED OPIOID TREATMENT)  GROUP PROGRESS NOTE     Televisit consent, as applicable:  For the patient participating in the group via telephone and/or video technology: The patient/guardian has verbally consented to participate in this group therapy session by televisit. The patient/guardian was encouraged to be in a private location due to personal health information being discussed and where they could not be overheard by individuals not participating in the group therapy. Although all [facility] staff/providers participating in this group therapy televisit are in a private location, all the risks of telephone and/or video technology used to conduct this group therapy session cannot be controlled by [place], (i.e. interruptions, unauthorized access/recording and technical difficulties).  Each patient understands that the visit can be stopped at any time for any reason, including if the conferencing connections are inadequate.     GROUP NAME:  Office Based Opioid Treatment Group     LEADER(S): Hampton Abbot, MD, Cherlynn Perches, Ph.D., and [name], RN      Cheryl Dixon is a 65 year old, Divorced, female, who presented for [facility] group visit.   Length of group: 60 min which included 30 min of Health Behavior Intervention and OUD Psychoeducation.     Number of participants in group today: 10    Group Topic Today: Urge surfing as a skill to prevent relapses.   Urge surfing is a technique for managing ones own unwanted behaviors. Rather than giving in to an urge, a person learns to ride it out, like a surfer riding a wave. After a short time, the urge will pass on its own. This technique can be used to stop or reduce drug and alcohol use, emotional reactions such as blowing up when angry, gambling, and other unwanted behaviors. Group members shared concrete examples of "urge surfing" from their own life. Group members discussed other strategies to reduce relapse risks and increase healthy coping mechanisms to  support recovery and healthy change.      Purpose of Group:    Education about Opioid Use Disorder (OUD) and treatment   Enhancing adjustment and coping with OUD   Improving management of OUD and treatment adherence   Supporting Recovery Process   Increasing health promoting behaviors   Decreasing health risk behaviors and risks of relapse   Addressing psychological/psychosocial/behavioral barriers to preventing addiction and relapse   Promotion of functional improvement     Group Interventions Utilized:   Psychoeducation related to the psychological, behavioral, and/ or psychosocial aspects of OUD   Motivational interviewing   Coping skills training   Cognitive restructuring   Emotional awareness and management   Operant behavior therapy and contingency management   Stimulus control   Mindfulness techniques training    Group Process:      Group rules    Review of group goals/structure   Group members  reviewed specific strategies to reduce volnerability to urges/cravings.    Group members reviewed  strategies to increase healthy coping mechanisms to support recovery and change.   Group members explored and shared additional relapse prevention strategies.    Mindfulness and self-compassion strategies discussed.   Discussed support and helpful strategies to promote recovery   Check in            Individual Patient Participation: Pt presented to the group but did not contribute much. Mood seems to be neutral today. Reported no relapses/close calls. Pt appears to be in stable recovery.      Diagnosis (addressed by this group):  Opioid dependence on agonist therapy ([place])  (primary encounter diagnosis)  Severe episode of recurrent major depressive disorder, without psychotic features ([place])           Relevant changes in mental status:   No major changes reported or observed       Plan: Pt will continue OBOT treatment     Cherlynn Perches, Ph.D.

## 2021-03-02 ENCOUNTER — Ambulatory Visit
Admission: RE | Admit: 2021-03-02 | Discharge: 2021-03-02 | Disposition: A | Discharge status: Home / Self Care | Payer: 59 | Source: Ambulatory Visit | Attending: Orthopaedic Surgery | Admitting: Orthopaedic Surgery

## 2021-03-02 DIAGNOSIS — G8929 Other chronic pain: Secondary | ICD-10-CM

## 2021-03-02 DIAGNOSIS — M545 Low back pain, unspecified: Secondary | ICD-10-CM

## 2021-03-05 ENCOUNTER — Ambulatory Visit (HOSPITAL_BASED_OUTPATIENT_CLINIC_OR_DEPARTMENT_OTHER): Payer: 59

## 2021-03-05 ENCOUNTER — Other Ambulatory Visit: Payer: Self-pay

## 2021-03-05 ENCOUNTER — Ambulatory Visit: Payer: 59 | Admitting: Orthopaedic Surgery

## 2021-03-05 ENCOUNTER — Telehealth: Payer: Self-pay | Admitting: Orthopaedic Surgery

## 2021-03-05 DIAGNOSIS — Z1239 Encounter for other screening for malignant neoplasm of breast: Secondary | ICD-10-CM

## 2021-03-05 DIAGNOSIS — M545 Low back pain, unspecified: Secondary | ICD-10-CM

## 2021-03-05 DIAGNOSIS — G8929 Other chronic pain: Secondary | ICD-10-CM

## 2021-03-05 NOTE — Telephone Encounter (Signed)
See message please

## 2021-03-05 NOTE — Telephone Encounter (Signed)
Reviewed MRI with patient.  Michelle Roberts, can you refer to [facility] for facet block vs. Esi?

## 2021-03-05 NOTE — Telephone Encounter (Signed)
Order made

## 2021-03-05 NOTE — Telephone Encounter (Signed)
Patient called needing a call back with the results of her MRI. The number to contact patient is [phone]

## 2021-03-06 ENCOUNTER — Other Ambulatory Visit: Payer: Self-pay | Admitting: Internal Medicine

## 2021-03-06 ENCOUNTER — Ambulatory Visit (HOSPITAL_BASED_OUTPATIENT_CLINIC_OR_DEPARTMENT_OTHER): Payer: 59 | Admitting: Women's Health

## 2021-03-07 ENCOUNTER — Telehealth: Payer: Self-pay | Admitting: Physical Medicine and Rehabilitation

## 2021-03-07 MED FILL — ESTRADIOL CRE 0.01%: 79 days supply | Qty: 172 | Fill #0 | Status: CP

## 2021-03-07 NOTE — Telephone Encounter (Signed)
Pt called and would like to get scheduled with Dr.Newton.   Cb [phone]

## 2021-03-08 ENCOUNTER — Encounter (HOSPITAL_BASED_OUTPATIENT_CLINIC_OR_DEPARTMENT_OTHER): Payer: Self-pay

## 2021-03-11 ENCOUNTER — Ambulatory Visit (INDEPENDENT_AMBULATORY_CARE_PROVIDER_SITE_OTHER): Payer: 59 | Admitting: Family Medicine

## 2021-03-11 ENCOUNTER — Ambulatory Visit: Payer: 59 | Admitting: Physical Medicine and Rehabilitation

## 2021-03-11 ENCOUNTER — Encounter: Payer: Self-pay | Admitting: Physical Medicine and Rehabilitation

## 2021-03-11 ENCOUNTER — Encounter (INDEPENDENT_AMBULATORY_CARE_PROVIDER_SITE_OTHER): Payer: Self-pay | Admitting: Family Medicine

## 2021-03-11 ENCOUNTER — Other Ambulatory Visit: Payer: Self-pay

## 2021-03-11 VITALS — BP 164/80 | HR 108

## 2021-03-11 VITALS — BP 149/82 | HR 96 | Temp 98.1°F | Ht <= 58 in | Wt 189.0 lb

## 2021-03-11 DIAGNOSIS — M0579 Rheumatoid arthritis with rheumatoid factor of multiple sites without organ or systems involvement: Secondary | ICD-10-CM

## 2021-03-11 DIAGNOSIS — M47816 Spondylosis without myelopathy or radiculopathy, lumbar region: Secondary | ICD-10-CM

## 2021-03-11 DIAGNOSIS — M48062 Spinal stenosis, lumbar region with neurogenic claudication: Secondary | ICD-10-CM

## 2021-03-11 DIAGNOSIS — M4726 Other spondylosis with radiculopathy, lumbar region: Secondary | ICD-10-CM

## 2021-03-11 DIAGNOSIS — K5900 Constipation, unspecified: Secondary | ICD-10-CM

## 2021-03-11 DIAGNOSIS — Z6841 Body Mass Index (BMI) 40.0 and over, adult: Secondary | ICD-10-CM

## 2021-03-11 DIAGNOSIS — M797 Fibromyalgia: Secondary | ICD-10-CM | POA: Diagnosis not present

## 2021-03-11 DIAGNOSIS — E119 Type 2 diabetes mellitus without complications: Secondary | ICD-10-CM

## 2021-03-11 NOTE — Progress Notes (Signed)
Chief Complaint:   OBESITY Michelle Roberts is here to discuss her progress with her obesity treatment plan along with follow-up of her obesity related diagnoses. Dru is on the [employer] and states she is following her eating plan approximately 30% of the time. Banita states she is doing 0 minutes 0 times per week.  Today's visit was #: 8 Starting weight: 202 lbs Starting date: 06/22/2020 Today's weight: 189 lbs Today's date: 03/11/2021 Total lbs lost to date: 13 Total lbs lost since last in-office visit: 0  Interim History: Michelle Roberts has struggled to stay on track. She has multiple health issues that she is working on including, back pain and constipation. She is trying to get back on track.  Subjective:   1. Type 2 diabetes mellitus without complication, without long-term current use of insulin ([place]) Shalyn started on [place] and she is doing well. She is on a low dose with no GI upset.  2. Constipation, unspecified constipation type Michelle Roberts notes increased constipation, and she started miralax 2 times per day approximately 3 days ago. She is also on Senakot and magnesium. She stopped her iron supplements. She denies severe abdominal cramping.  Assessment/Plan:   1. Type 2 diabetes mellitus without complication, without long-term current use of insulin ([place]) [name] will continue [facility], and will continue to follow up as directed. Good blood sugar control is important to decrease the likelihood of diabetic complications such as nephropathy, neuropathy, limb loss, blindness, coronary artery disease, and death. Intensive lifestyle modification including diet, exercise and weight loss are the first line of treatment for diabetes.   2. Constipation, unspecified constipation type Michelle Roberts was educated on decreased bowel movement frequency with weight loss as well as medications. She will continue as is and contact her primary care provider if no BM in the next 2 days. Orders and follow up as  documented in patient record.   3. Obesity with current BMI 39.6 [name] is currently in the action stage of change. As such, her goal is to continue with weight loss efforts. She has agreed to keeping a food journal and adhering to recommended goals of 1200-1500 calories and 85+ grams of protein daily.   Behavioral modification strategies: increasing lean protein intake, increasing water intake, and meal planning and cooking strategies.  Michelle Roberts has agreed to follow-up with our clinic in 4 weeks. She was informed of the importance of frequent follow-up visits to maximize her success with intensive lifestyle modifications for her multiple health conditions.   Objective:   Blood pressure (!) 149/82, pulse 96, temperature 98.1 F (36.7 C), temperature source Oral, height 4\' 10"  (1.473 m), weight 189 lb (85.7 kg), SpO2 100 %. Body mass index is 39.5 kg/m.  General: Cooperative, alert, well developed, in no acute distress. HEENT: Conjunctivae and lids unremarkable. Cardiovascular: Regular rhythm.  Lungs: Normal work of breathing. Neurologic: No focal deficits.   Lab Results  Component Value Date   CREATININE 1.0 10/04/2020   BUN 22 (A) 10/04/2020   NA 140 09/19/2020   K 4.3 09/19/2020   CL 95 (L) 09/19/2020   CO2 24 09/19/2020   Lab Results  Component Value Date   ALT 22 10/04/2020   AST 23 10/04/2020   ALKPHOS 73 10/04/2020   BILITOT 0.5 09/19/2020   Lab Results  Component Value Date   HGBA1C 6.7 (H) 09/19/2020   Lab Results  Component Value Date   INSULIN 9.3 09/19/2020   Lab Results  Component Value Date   TSH 0.07 (  A) 10/04/2020   Lab Results  Component Value Date   CHOL 232 (H) 09/19/2020   HDL 95 09/19/2020   LDLCALC 123 (H) 09/19/2020   TRIG 85 09/19/2020   Lab Results  Component Value Date   VD25OH 56.1 09/19/2020   VD25OH 46.7 05/01/2015   Lab Results  Component Value Date   WBC 8.9 10/04/2020   HGB 13.3 10/04/2020   HCT 42 10/04/2020   MCV 80  09/19/2020   PLT 252 10/04/2020   Lab Results  Component Value Date   FERRITIN 456 10/04/2020   Attestation Statements:   Reviewed by clinician on day of visit: allergies, medications, problem list, medical history, surgical history, family history, social history, and previous encounter notes.  Time spent on visit including pre-visit chart review and post-visit care and charting was 36 minutes.    I, Trixie Dredge, am acting as transcriptionist for Dennard Nip, MD.  I have reviewed the above documentation for accuracy and completeness, and I agree with the above. -  Dennard Nip, MD

## 2021-03-11 NOTE — Progress Notes (Signed)
Pt state lower back pain that travels to buttocks and both hip and legs.. Pt state walking, standing and sitting makes the pain worse. Pt state she takes pain meds and uses heating pad to help ease her pain.  Numeric Pain Rating Scale and Functional Assessment Average Pain 10 Pain Right Now 3 My pain is intermittent, constant, sharp, burning, dull, stabbing, tingling, and aching Pain is worse with: walking, sitting, standing, and some activites Pain improves with: heat/ice and medication   In the last MONTH (on 0-10 scale) has pain interfered with the following?  1. General activity like being  able to carry out your everyday physical activities such as walking, climbing stairs, carrying groceries, or moving a chair?  Rating(7)  2. Relation with others like being able to carry out your usual social activities and roles such as  activities at home, at work and in your community. Rating(8)  3. Enjoyment of life such that you have  been bothered by emotional problems such as feeling anxious, depressed or irritable?  Rating(9)

## 2021-03-11 NOTE — Progress Notes (Signed)
Michelle Roberts - 65 y.o. female MRN [id number]  Date of birth: 1955/10/22  Office Visit Note: Visit Date: 03/11/2021 PCP: Bryson [facility], NP Referred by: Bryson [facility], NP  Subjective: Chief Complaint  Patient presents with   Lower Back - Pain   Left Leg - Pain   Right Leg - Pain   Right Hip - Pain   Left Hip - Pain   HPI: Michelle Roberts is a 65 y.o. female who comes in today per the request of Dr. Eduard Roux for evaluation of chronic, worsening and severe bilateral lower back pain radiating to hips, buttocks and down right lateral thigh. Patient reports pain has been chronic for several years. Patient states pain is exacerbated by getting out of bed, bending and prolonged walking/standing. Describes pain as aching, sharp and cramping, currently rates as 5 out of 10. Patient reports some relief of pain with home exercise regimen, rest and medications as needed. Patient reports good pain relief specifically with OTC Lidocaine patches. Patient recently completed formal physical therapy and dry needling at [facility] and does report some relief of pain, however pain relief was short lived. Patient's recent lumbar MRI exhibits moderate spinal stenosis at L3-L4 and L4-L5, moderate bilateral facet arthropathy at L4-L5 and severe facet arthropathy at L5-S1. Patient states she is having difficulty performing daily tasks and working due to severe pain. Patient does have a history of fibromyalgia and rheumatoid arthritis and is currently being treated by Dr. Bo Merino at [facility]. Patient is also currently seeing Dr. Dennard Nip at [facility] and states she is working on eating better and loosing weight. Patient denies focal weakness, numbness and tingling. Patient denies recent trauma or falls.   Review of Systems  Musculoskeletal:  Positive for back pain and myalgias.  Neurological:  Negative for tingling, sensory change, focal weakness and  weakness.  All other systems reviewed and are negative. Otherwise per HPI.  Assessment & Plan: Visit Diagnoses:    ICD-10-CM   1. Spinal stenosis of lumbar region with neurogenic claudication  M48.062 Ambulatory referral to Physical Medicine Rehab    2. Other spondylosis with radiculopathy, lumbar region  M47.26     3. Facet hypertrophy of lumbar region  M47.816     4. Fibromyalgia  M79.7     5. Rheumatoid arthritis with rheumatoid factor of multiple sites without organ or systems involvement (HCC)  M05.79        Plan: Findings:  Chronic, worsening and severe bilateral lower back pain radiating to hips, buttocks and down right lateral thigh. Patient continues to have excruciating pain despite good conservative therapies such as formal physical therapy, rest and use of medications as needed. Patient's clinical presentation and exam are consistent with L4 nerve pattern and neurogenic claudication as a result of spinal canal stenosis as well as back pain associated with facet arthritis. We also believe that patient's fibromyalgia could be working to exacerbate her symptoms. We feel the next step is to perform a diagnostic and hopefully therapeutic right L4 transforaminal epidural steroid injection under fluoroscopic guidance. If patient does well with injection and gets good relief of radicular symptoms but lower back pain remains we would consider performing facet joint injections. Patient encouraged to continue performing home exercises as tolerated. We did speak about re-grouping with our in house physical therapy in the future if warranted. No red flag symptoms noted upon exam today.    Meds & Orders: No orders of the  defined types were placed in this encounter.   Orders Placed This Encounter  Procedures   Ambulatory referral to Physical Medicine Rehab     Follow-up: Return in about 1 week (around 03/18/2021) for Right L4 transforaminal epidural steroid injection.   Procedures: No  procedures performed      Clinical History: No specialty comments available.   She reports that she has never smoked. She has never used smokeless tobacco.  Recent Labs    09/19/20 1125  HGBA1C 6.7*    Objective:  VS:  HT:    WT:   BMI:     BP: (!) 164/80  HR: (!) 108bpm  TEMP: ( )  RESP:  Physical Exam Vitals and nursing note reviewed.  HENT:     Head: Normocephalic and atraumatic.     Right Ear: External ear normal.     Left Ear: External ear normal.     Nose: Nose normal.     Mouth/Throat:     Mouth: Mucous membranes are dry.  Eyes:     Extraocular Movements: Extraocular movements intact.  Cardiovascular:     Rate and Rhythm: Normal rate.     Pulses: Normal pulses.  Pulmonary:     Effort: Pulmonary effort is normal.  Abdominal:     General: Abdomen is flat. There is no distension.  Musculoskeletal:        General: Tenderness present.     Cervical back: Normal range of motion.     Comments: Pt is slow to rise from seated position to standing. Good lumbar range of motion. Strong distal strength without clonus, no pain upon palpation of greater trochanters. Sensation intact bilaterally. Dysesthesias noted to right L4 dermatome. Walks independently, gait steady. Equivocally positive slump test.   Skin:    General: Skin is warm and dry.     Capillary Refill: Capillary refill takes less than 2 seconds.  Neurological:     General: No focal deficit present.     Mental Status: She is alert and oriented to person, place, and time.  Psychiatric:        Mood and Affect: Mood normal.    Ortho Exam  Imaging: No results found.  Past Medical/Family/Surgical/Social History: Medications & Allergies reviewed per EMR, new medications updated. 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   Fibromyalgia 08/12/2016   High risk medication use 08/12/2016    Primary osteoarthritis of both hands 08/12/2016   Acute midline low back pain 08/12/2016   DDD (degenerative disc disease), lumbar 08/12/2016   History of hypertension 08/12/2016   History of high cholesterol 08/12/2016   Thyroid ca ([place]) 08/12/2016   History of hypothyroidism 08/12/2016   History of gastroesophageal reflux (GERD) 08/12/2016   History of TMJ syndrome 08/12/2016   Burning tongue syndrome 08/12/2016   History of vitamin D deficiency 08/12/2016   Other sleep apnea 08/12/2016   Vitamin D deficiency 08/12/2016   Rheumatoid arthritis with rheumatoid factor of multiple sites without organ or systems involvement ([place]) 12/31/2015   Goals of care, counseling/discussion 06/02/2013   Myofascial muscle pain 06/02/2013   Narcotic-induced mood disorder ([place]) 06/02/2013   Inflammatory arthritis 04/08/2011   Plantar fasciitis 04/08/2011   Yeast infection 04/08/2011   Past Medical History:  Diagnosis Date   Anemia    Anxiety    Arthritis    Asthma    Back  pain    Cancer ([place])    Depression    Fibromyalgia    Gallbladder problem    GERD (gastroesophageal reflux disease)    Hyperlipidemia    Hypertension    Hypothyroidism    thyroidectomy   Joint pain    Morton neuroma, right    per patient    Neoplasm of parotid gland    Neuromuscular disorder ([place])    Osteoarthritis    Palpitation    Prediabetes    Rheumatoid aortitis    Sleep apnea    uses CPAP sometimes   Swelling of lower extremity    Vitamin D deficiency    Family History  Problem Relation Age of Onset   Alzheimer's disease Mother    Hypertension Mother    Hyperlipidemia Mother    Alzheimer's disease Sister    Diabetes Brother    High Cholesterol Brother    Hypertension Brother    Diabetes Sister    Hypertension Sister    High Cholesterol Sister    Diabetes Sister    Hypertension Sister    High Cholesterol Sister    Diabetes Brother    Hypertension Brother    High Cholesterol Brother     Healthy Daughter    Healthy Daughter    Past Surgical History:  Procedure Laterality Date   ABDOMINAL HYSTERECTOMY     CHOLECYSTECTOMY     HAMMER TOE SURGERY     MASS EXCISION N/A 11/17/2017   Procedure: EXCISION SOFT PALATE LESION;  Surgeon: Rozetta Nunnery, MD;  Location: [place];  Service: ENT;  Laterality: N/A;   PAROTIDECTOMY Left 04/08/2016   Procedure: LEFT SUPERFICIAL PAROTIDECTOMY WITH FACIAL NERVE DISECTION;  Surgeon: Rozetta Nunnery, MD;  Location: [place];  Service: ENT;  Laterality: Left;   TARSAL METATARSAL ARTHRODESIS Right 02/03/2018   Procedure: RIGHT GREAT TOE  FUSION;  Surgeon: Leandrew Koyanagi, MD;  Location: [place];  Service: Orthopedics;  Laterality: Right;   THYROIDECTOMY     Social History   Occupational History   Not on file  Tobacco Use   Smoking status: Never   Smokeless tobacco: Never  Vaping Use   Vaping Use: Never used  Substance and Sexual Activity   Alcohol use: No   Drug use: No   Sexual activity: Not on file

## 2021-03-12 ENCOUNTER — Ambulatory Visit: Payer: 59 | Attending: Psychosomatic Medicine | Admitting: Clinical

## 2021-03-12 ENCOUNTER — Ambulatory Visit: Payer: 59 | Attending: Family Medicine | Admitting: Family Medicine

## 2021-03-12 DIAGNOSIS — F332 Major depressive disorder, recurrent severe without psychotic features: Secondary | ICD-10-CM | POA: Insufficient documentation

## 2021-03-12 DIAGNOSIS — F1120 Opioid dependence, uncomplicated: Secondary | ICD-10-CM | POA: Insufficient documentation

## 2021-03-12 MED ORDER — BUPRENORPHINE HCL-NALOXONE HCL 8-2 MG SL FILM
ORAL_FILM | SUBLINGUAL | 0 refills | Status: DC
Start: 2021-03-12 — End: 2021-03-13

## 2021-03-12 MED FILL — BUPREN/NALOX MIS 8-2MG: 14 days supply | Qty: 35 | Fill #0

## 2021-03-12 NOTE — Progress Notes (Signed)
Cheryl Dixon is a 65 year old female seen in follow up for opioid dependence:    Buprenorphine dose: 20mg   Response, adequacy of dose: good  Relapses/close calls: none  Trigger: denies  Meetings: none currently    Social history/events:  Doing okay, thinking about Thanksgiving.  Recovery stable.    Present Medications:  buprenorphine-naloxone (SUBOXONE) 8-2 MG sublingual film, Take 2.5 films under the tongue daily., Disp: 35 Film, Rfl: 0  estradiol (ESTRACE VAGINAL) 0.1 MG/GM vaginal cream, Place 5 g vaginally 3 (three) times a week M[employer] brand only., Disp: 170 g, Rfl: 1  INCRUSE ELLIPTA 62.5 MCG/INH inhaler, INHALE 1 PUFF BY MOUTH INTO LUNGS DAILY, Disp: 30 each, Rfl: 11  buPROPion (WELLBUTRIN SR) 200 MG 12 hr tablet, Take 1 tablet by mouth in the morning and 1 tablet before bedtime., Disp: 180 tablet, Rfl: 3  polyethylene glycol (HEALTHYLAX) 17 g packet, TAKE 17G BY MOUTH DAILY MIX IN 64 OUNCE OF CLEAR LIQUID AND TAKE as directed, Disp: 28 packet, Rfl: 2  albuterol HFA 108 (90 Base) MCG/ACT inhaler, INHALE 2 PUFFS INTO THE LUNGS EVERY 6 HOURS AS NEEDED FOR WHEEZING OR SHORTNESS OF BREATH, Disp: 8.5 g, Rfl: 11  polyethylene glycol (GOLYTELY) 236 g suspension, Take 4,000 mLs by mouth See Admin Instructions Take as directed prior to your colonoscopy, Disp: 4000 mL, Rfl: 0  Simethicone 80 MG TABS, Take 4 tablets by mouth See Admin Instructions Take as directed prior to colonoscopy., Disp: 4 tablet, Rfl: 0  naproxen (NAPROSYN) 500 MG tablet, TAKE 1 TABLET BY MOUTH WITH FOOD EVERY TWELVE HOURS AS NEEDED FOR PAIN, Disp: 20 tablet, Rfl: 0  cholecalciferol (VITAMIN D3) 1000 UNIT tablet, Take 1,000 Units by mouth daily, Disp: , Rfl:   acetaminophen (TYLENOL) 500 MG tablet, Take 500 mg by mouth every 6 (six) hours as needed for Pain, Disp: , Rfl:   ipratropium-albuterol (DUO-NEB) 0.5-2.5 (3) MG/3ML SOLN Inhalation Solution, Take 3 mLs by nebulization 4 (four) times daily., Disp: 180 mL, Rfl: 0    No current  facility-administered medications on file prior to visit.      PHQ-9 TOTAL SCORE 01/12/2019 06/01/2018 03/03/2017   Doc FlowSheet Total Score - - -   Doc FlowSheet Total Score 10 6 4            PHYSICAL EXAMINATION via video:  General appearance - healthy female in no distress  Neuro - nonfocal  Affect - normal  Behavior without evidence of intoxication or impairment    PMP Reviewed.  No unauthorized prescriptions since last refill.     Assessment:  Opioid Dependence  Comment: Continue as planned.  Plan:  Continue buprenorphine, reviewed criteria for tapering when patient is ready. Discussed sources of support.  Patient is counseled regarding relapse prevention, involvement in recovery groups, potential side effects of buprenorphine, and eventual taper of medication.    For the patient participating in the group via telephone and/or video technology: the patient/guardian has verbally consented to participate in this group therapy session by televisit. The patient/guardian was encouraged to be in a private location due to personal health information being discussed and where they could not be overheard by individuals not participating in the group therapy. Although all [facility] staff/providers participating in this group therapy televisit are in a private location, all the risks of telephone and/or video technology used to conduct this group therapy session cannot be controlled by [place], (i.e. interruptions, unauthorized access/recording and technical difficulties).  Each patient understands that the visit can be  stopped at any time for any reason, including if the conferencing connections are inadequate.       Geni Bers, MD, 03/12/2021

## 2021-03-13 MED ORDER — BUPRENORPHINE HCL-NALOXONE HCL 8-2 MG SL FILM
ORAL_FILM | SUBLINGUAL | 0 refills | Status: DC
Start: 2021-03-13 — End: 2021-03-25

## 2021-03-13 NOTE — Progress Notes (Signed)
[facility] (OFFICE BASED OPIOID TREATMENT)  GROUP PROGRESS NOTE     Televisit consent, as applicable:  For the patient participating in the group via telephone and/or video technology: The patient/guardian has verbally consented to participate in this group therapy session by televisit. The patient/guardian was encouraged to be in a private location due to personal health information being discussed and where they could not be overheard by individuals not participating in the group therapy. Although all [place] staff/providers participating in this group therapy televisit are in a private location, all the risks of telephone and/or video technology used to conduct this group therapy session cannot be controlled by [place], (i.e. interruptions, unauthorized access/recording and technical difficulties).  Each patient understands that the visit can be stopped at any time for any reason, including if the conferencing connections are inadequate.     GROUP NAME:  Office Based Opioid Treatment Group     LEADER(S): Hampton Abbot, MD, Cherlynn Perches, Ph.D, [name], RN      Cheryl Dixon is a 65 year old, Divorced, female, who presented for [facility] group visit.   Length of group: 60 min which included 30 min of Health Behavior Intervention and OUD Psychoeducation.     Number of participants in group today: 10    Group Topic Today: Thanksgiving Relapse Prevention Plan.  Group members reviewed Common Thanksgiving Stressors and High Risk situations for relapse as immediate precipitators of drug use relapse.  Group members shared concrete examples of holiday stressful situations from their own life. Group members shared Thanksgiving plans. Group members discussed strategies to reduce relapse risks and increase healthy coping mechanisms to support recovery and healthy change.      Purpose of Group:    Education about Opioid Use Disorder (OUD) and treatment   Enhancing adjustment and coping with OUD   Improving management of OUD and  treatment adherence   Supporting Recovery Process   Increasing health promoting behaviors   Decreasing health risk behaviors and risks of relapse   Addressing psychological/psychosocial/behavioral barriers to preventing addiction and relapse   Promotion of functional improvement     Group Interventions Utilized:   Psychoeducation related to the psychological, behavioral, and/ or psychosocial aspects of OUD   Motivational interviewing   Coping skills training   Cognitive restructuring   Emotional awareness and management   Operant behavior therapy and contingency management   Stimulus control   Mindfulness techniques training    Group Process:      Group rules    Review of group goals/structure   Group members discussed relapse prevention strategies during Thanksgiving.   Group members  reviewed specific strategies to reduce common Thanksgiving stressors.    Group members reviewed  strategies to increase healthy coping mechanisms to support recovery and change.   Group members explored and shared additional relapse prevention strategies.    Mindfulness and self-compassion strategies discussed.   Discussed support and helpful strategies to promote recovery   Check in            Individual Patient Participation: Pt presented as engaged and reported doing well this week. She shared that she lived by herself, and this is something she was dreaming about for years. Pt has no concerns about recovery. Social history update: no major updates. Reported practicing relapse prevention skills by avoiding parties. Pt reports she has a lot of relatives, and a lot of them of them deal addiction issues, and old memories can "bring past trauma".   Pt reported no relapses/close  calls. Pt appears to be in stable recovery.      Diagnosis (addressed by this group):     Opioid dependence on agonist therapy ([place])  (primary encounter diagnosis)  Severe episode of recurrent major depressive disorder, without psychotic  features ([place])           Relevant changes in mental status: Mood seems neutral.  No major changes reported or observed       Plan: Pt will continue [facility] treatment     Cherlynn Perches, Ph.D.

## 2021-03-14 MED FILL — BUPROPION 200MG ER: 90 days supply | Qty: 180 | Fill #0

## 2021-03-18 MED FILL — NICOTINE TD DIS 21MG/24H: 28 days supply | Qty: 28 | Fill #0

## 2021-03-25 ENCOUNTER — Other Ambulatory Visit (HOSPITAL_BASED_OUTPATIENT_CLINIC_OR_DEPARTMENT_OTHER): Payer: Self-pay | Admitting: Family Medicine

## 2021-03-25 DIAGNOSIS — F1120 Opioid dependence, uncomplicated: Secondary | ICD-10-CM

## 2021-03-25 MED FILL — INCRUSE ELPT INH 62.5MCG: 30 days supply | Qty: 30 | Fill #0

## 2021-03-25 NOTE — Telephone Encounter (Signed)
PER Patient (self), Cheryl Dixon is a 65 year old female has requested a refill of      - suboxone 8-2 film - start date: 03-13-21 end date: 03-27-21    Last Office Visit: 03-12-21 with elana bloomfield  Last Physical Exam: 05-27-13      Other Med Adult:  Most Recent BP Reading(s)  12/06/20 : 117/54        Cholesterol (mg/dL)   Date Value   10/22/2020 233     LOW DENSITY LIPOPROTEIN DIRECT (mg/dL)   Date Value   10/22/2020 158     HIGH DENSITY LIPOPROTEIN (mg/dL)   Date Value   10/22/2020 40     No results found for: TG      THYROID SCREEN TSH REFLEX FT4 (uIU/mL)   Date Value   01/12/2019 1.630         No results found for: TSH    HEMOGLOBIN A1C (%)   Date Value   01/12/2019 4.6       No results found for: POCA1C      INR (no units)   Date Value   02/16/2007 1.0 (L)   07/03/2006 < 1.0 (L)       SODIUM (mmol/L)   Date Value   10/22/2020 143       POTASSIUM (mmol/L)   Date Value   10/22/2020 4.5           CREATININE (mg/dL)   Date Value   10/22/2020 0.9       Documented patient preferred pharmacies:      [id number] - [place], [address]  Phone: [phone] Fax: [phone]

## 2021-03-26 ENCOUNTER — Ambulatory Visit (HOSPITAL_BASED_OUTPATIENT_CLINIC_OR_DEPARTMENT_OTHER): Payer: 59 | Admitting: Family Medicine

## 2021-03-26 MED ORDER — BUPRENORPHINE HCL-NALOXONE HCL 8-2 MG SL FILM
ORAL_FILM | SUBLINGUAL | 0 refills | Status: DC
Start: 2021-03-27 — End: 2021-04-09

## 2021-04-02 ENCOUNTER — Ambulatory Visit (HOSPITAL_BASED_OUTPATIENT_CLINIC_OR_DEPARTMENT_OTHER): Payer: 59 | Admitting: Specialist

## 2021-04-02 ENCOUNTER — Ambulatory Visit: Payer: Self-pay

## 2021-04-02 ENCOUNTER — Ambulatory Visit: Payer: 59 | Admitting: Physical Medicine and Rehabilitation

## 2021-04-02 ENCOUNTER — Other Ambulatory Visit: Payer: Self-pay

## 2021-04-02 ENCOUNTER — Encounter: Payer: Self-pay | Admitting: Physical Medicine and Rehabilitation

## 2021-04-02 VITALS — BP 168/89 | HR 96

## 2021-04-02 DIAGNOSIS — M48062 Spinal stenosis, lumbar region with neurogenic claudication: Secondary | ICD-10-CM | POA: Diagnosis not present

## 2021-04-02 MED ORDER — METHYLPREDNISOLONE ACETATE 80 MG/ML IJ SUSP
80.0000 mg | Freq: Once | INTRAMUSCULAR | Status: AC
  Administered 2021-04-02: 16:00:00 80 mg

## 2021-04-02 NOTE — Progress Notes (Signed)
Pt states pain is mostly on right side and down right leg down to heel.  Some tingling  Pt states pain is better with tennis ball pressure point massage,lidocaine patches, heat and epsom salt baths  Pt states standing and certain chairs increases pain  Taking meloxicam, robaxin(not helping) and tylenol   Numeric Pain Rating Scale and Functional Assessment Average Pain 7   In the last MONTH (on 0-10 scale) has pain interfered with the following?  1. General activity like being  able to carry out your everyday physical activities such as walking, climbing stairs, carrying groceries, or moving a chair?  Rating(10)   +Driver, -BT, -Dye Allergies.

## 2021-04-03 ENCOUNTER — Encounter (HOSPITAL_BASED_OUTPATIENT_CLINIC_OR_DEPARTMENT_OTHER): Payer: Self-pay

## 2021-04-03 ENCOUNTER — Ambulatory Visit (HOSPITAL_BASED_OUTPATIENT_CLINIC_OR_DEPARTMENT_OTHER): Payer: 59 | Admitting: Women's Health

## 2021-04-04 ENCOUNTER — Telehealth (HOSPITAL_BASED_OUTPATIENT_CLINIC_OR_DEPARTMENT_OTHER): Payer: Self-pay | Admitting: Specialist

## 2021-04-04 NOTE — Telephone Encounter (Signed)
Called patient to reschedule appointment with Dr. Adelene Amas but she stated that she has a lot going on right now and she prefers not to reschedule at this moment.  Patient stated that will call us back for rescheduling once she is ready.

## 2021-04-08 ENCOUNTER — Ambulatory Visit (INDEPENDENT_AMBULATORY_CARE_PROVIDER_SITE_OTHER): Payer: 59 | Admitting: Family Medicine

## 2021-04-09 ENCOUNTER — Ambulatory Visit: Payer: 59 | Admitting: Family Medicine

## 2021-04-09 ENCOUNTER — Ambulatory Visit: Payer: 59 | Admitting: Clinical

## 2021-04-09 ENCOUNTER — Ambulatory Visit (INDEPENDENT_AMBULATORY_CARE_PROVIDER_SITE_OTHER): Payer: 59 | Admitting: Family Medicine

## 2021-04-09 ENCOUNTER — Other Ambulatory Visit: Payer: Self-pay

## 2021-04-09 ENCOUNTER — Encounter (INDEPENDENT_AMBULATORY_CARE_PROVIDER_SITE_OTHER): Payer: Self-pay | Admitting: Family Medicine

## 2021-04-09 VITALS — BP 164/99 | HR 83 | Temp 97.9°F | Ht <= 58 in | Wt 184.0 lb

## 2021-04-09 DIAGNOSIS — F1120 Opioid dependence, uncomplicated: Secondary | ICD-10-CM

## 2021-04-09 DIAGNOSIS — Z5321 Procedure and treatment not carried out due to patient leaving prior to being seen by health care provider: Secondary | ICD-10-CM

## 2021-04-09 DIAGNOSIS — I10 Essential (primary) hypertension: Secondary | ICD-10-CM

## 2021-04-09 DIAGNOSIS — Z6841 Body Mass Index (BMI) 40.0 and over, adult: Secondary | ICD-10-CM | POA: Diagnosis not present

## 2021-04-09 DIAGNOSIS — E119 Type 2 diabetes mellitus without complications: Secondary | ICD-10-CM | POA: Diagnosis not present

## 2021-04-09 DIAGNOSIS — Z9189 Other specified personal risk factors, not elsewhere classified: Secondary | ICD-10-CM

## 2021-04-09 MED ORDER — BUPRENORPHINE HCL-NALOXONE HCL 8-2 MG SL FILM
ORAL_FILM | SUBLINGUAL | 0 refills | Status: DC
Start: 2021-04-09 — End: 2021-04-11

## 2021-04-09 MED ORDER — TIRZEPATIDE 2.5 MG/0.5ML ~~LOC~~ SOAJ: 2.5000 mg | SUBCUTANEOUS | 0 refills | Status: AC

## 2021-04-09 MED FILL — BUPREN/NALOX MIS 8-2MG: 14 days supply | Qty: 35 | Fill #0

## 2021-04-09 NOTE — Progress Notes (Signed)
This encounter was opened in error.  Please disregard.

## 2021-04-09 NOTE — Progress Notes (Signed)
Chief Complaint:   OBESITY Michelle Roberts is here to discuss her progress with her obesity treatment plan along with follow-up of her obesity related diagnoses. Michelle Roberts is on keeping a food journal and adhering to recommended goals of 1200-1500 calories and 85+ grams of protein daily or the [place] and states she is following her eating plan approximately 50% of the time. Michelle Roberts states she is doing 0 minutes 0 times per week.  Today's visit was #: 9 Starting weight: 202 lbs Starting date: 06/22/2020 Today's weight: 184 lbs Today's date: 04/09/2021 Total lbs lost to date: 18 Total lbs lost since last in-office visit: 5  Interim History: Michelle Roberts continues to do well with weight loss. She is working on meeting her protein goals, but she has had a lot of stress with her sister in-law passing yesterday.  Subjective:   1. Type 2 diabetes mellitus without complication, without long-term current use of insulin ([place]) Michelle Roberts was on [facility] but there is a shortage of the 5 mg dose.  2. Essential hypertension Michelle Roberts has white coat syndrome, but she is also dealing with the stress of her sister in-law's recent death.  3. At risk for heart disease Michelle Roberts is at a higher than average risk for cardiovascular disease due to obesity.   Assessment/Plan:   1. Type 2 diabetes mellitus without complication, without long-term current use of insulin (Michelle Roberts) Michelle Roberts agreed to change Michelle Roberts to 2.5 mg, and we will refill for 1 month. Good blood sugar control is important to decrease the likelihood of diabetic complications such as nephropathy, neuropathy, limb loss, blindness, coronary artery disease, and death. Intensive lifestyle modification including diet, exercise and weight loss are the first line of treatment for diabetes.   - tirzepatide [facility]) 2.5 MG/0.5ML Pen; Inject 2.5 mg into the skin once a week for 28 days.  Dispense: 2 mL; Refill: 0  2. Essential hypertension Alfred will continue with diet and  exercise, and she is to check her blood pressure at home. We will follow up at her next visit.  3. At risk for heart disease Michelle Roberts was given approximately 15 minutes of coronary artery disease prevention counseling today. She is 65 y.o. female and has risk factors for heart disease including obesity. We discussed intensive lifestyle modifications today with an emphasis on specific weight loss instructions and strategies.   Repetitive spaced learning was employed today to elicit superior memory formation and behavioral change.  4. Obesity with current BMI 38.6 Michelle Roberts is currently in the action stage of change. As such, her goal is to continue with weight loss efforts. She has agreed to the [facility] with 85 grams of protein daily.   Behavioral modification strategies: emotional eating strategies and holiday eating strategies .  Michelle Roberts has agreed to follow-up with our clinic in 3 to 4 weeks. She was informed of the importance of frequent follow-up visits to maximize her success with intensive lifestyle modifications for her multiple health conditions.   Objective:   Blood pressure (!) 164/99, pulse 83, temperature 97.9 F (36.6 C), height 4\' 10"  (1.473 m), weight 184 lb (83.5 kg), SpO2 99 %. Body mass index is 38.46 kg/m.  General: Cooperative, alert, well developed, in no acute distress. HEENT: Conjunctivae and lids unremarkable. Cardiovascular: Regular rhythm.  Lungs: Normal work of breathing. Neurologic: No focal deficits.   Lab Results  Component Value Date   CREATININE 1.0 10/04/2020   BUN 22 (A) 10/04/2020   NA 140 09/19/2020   K 4.3 09/19/2020  CL 95 (L) 09/19/2020   CO2 24 09/19/2020   Lab Results  Component Value Date   ALT 22 10/04/2020   AST 23 10/04/2020   ALKPHOS 73 10/04/2020   BILITOT 0.5 09/19/2020   Lab Results  Component Value Date   HGBA1C 6.7 (H) 09/19/2020   Lab Results  Component Value Date   INSULIN 9.3 09/19/2020   Lab Results  Component  Value Date   TSH 0.07 (A) 10/04/2020   Lab Results  Component Value Date   CHOL 232 (H) 09/19/2020   HDL 95 09/19/2020   LDLCALC 123 (H) 09/19/2020   TRIG 85 09/19/2020   Lab Results  Component Value Date   VD25OH 56.1 09/19/2020   VD25OH 46.7 05/01/2015   Lab Results  Component Value Date   WBC 8.9 10/04/2020   HGB 13.3 10/04/2020   HCT 42 10/04/2020   MCV 80 09/19/2020   PLT 252 10/04/2020   Lab Results  Component Value Date   FERRITIN 456 10/04/2020   Attestation Statements:   Reviewed by clinician on day of visit: allergies, medications, problem list, medical history, surgical history, family history, social history, and previous encounter notes.   I, Trixie Dredge, am acting as transcriptionist for Dennard Nip, MD.  I have reviewed the above documentation for accuracy and completeness, and I agree with the above. -  Dennard Nip, MD

## 2021-04-11 MED ORDER — BUPRENORPHINE HCL-NALOXONE HCL 8-2 MG SL FILM
ORAL_FILM | SUBLINGUAL | 0 refills | Status: DC
Start: 2021-04-11 — End: 2021-04-19

## 2021-04-11 NOTE — Addendum Note (Signed)
Addended by: Geni Bers on: 04/11/2021 04:55 PM     Modules accepted: Orders

## 2021-04-11 NOTE — Progress Notes (Signed)
Pt connected with the group virtually but disconnected before the group ended.

## 2021-04-14 NOTE — Progress Notes (Signed)
Michelle Roberts - 65 y.o. female MRN [id number]  Date of birth: 1955/12/09  Office Visit Note: Visit Date: 04/02/2021 PCP: Bryson Corona, NP Referred by: Bryson Corona, NP  Subjective: Chief Complaint  Patient presents with   Lower Back - Pain   HPI:  Michelle Roberts is a 65 y.o. female who comes in today at the request of Barnet Pall, FNP for planned Right L4-5 Lumbar Transforaminal epidural steroid injection with fluoroscopic guidance.  The patient has failed conservative care including home exercise, medications, time and activity modification.  This injection will be diagnostic and hopefully therapeutic.  Please see requesting physician notes for further details and justification.   ROS Otherwise per HPI.  Assessment & Plan: Visit Diagnoses:    ICD-10-CM   1. Spinal stenosis of lumbar region with neurogenic claudication  M48.062 XR C-ARM NO REPORT    Epidural Steroid injection    methylPREDNISolone acetate (DEPO-MEDROL) injection 80 mg      Plan: No additional findings.   Meds & Orders:  Meds ordered this encounter  Medications   methylPREDNISolone acetate (DEPO-MEDROL) injection 80 mg    Orders Placed This Encounter  Procedures   XR C-ARM NO REPORT   Epidural Steroid injection    Follow-up: Return if symptoms worsen or fail to improve.   Procedures: No procedures performed  Lumbosacral Transforaminal Epidural Steroid Injection - Sub-Pedicular Approach with Fluoroscopic Guidance  Patient: Michelle Roberts      Date of Birth: [birth year] MRN: [id number] PCP: Bryson Corona, NP      Visit Date: 04/02/2021   Universal Protocol:    Date/Time: 04/02/2021  Consent Given By: the patient  Position: PRONE  Additional Comments: Vital signs were monitored before and after the procedure. Patient was prepped and draped in the usual sterile fashion. The correct patient, procedure, and site was verified.   Injection Procedure Details:   Procedure  diagnoses: Spinal stenosis of lumbar region with neurogenic claudication [M48.062]    Meds Administered:  Meds ordered this encounter  Medications   methylPREDNISolone acetate (DEPO-MEDROL) injection 80 mg    Laterality: Right  Location/Site: L4  Needle:5.0 in., 22 ga.  Short bevel or Quincke spinal needle  Needle Placement: Transforaminal  Findings:    -Comments: Excellent flow of contrast along the nerve, nerve root and into the epidural space.  Procedure Details: After squaring off the end-plates to get a true AP view, the C-arm was positioned so that an oblique view of the foramen as noted above was visualized. The target area is just inferior to the "nose of the scotty dog" or sub pedicular. The soft tissues overlying this structure were infiltrated with 2-3 ml. of 1% Lidocaine without Epinephrine.  The spinal needle was inserted toward the target using a "trajectory" view along the fluoroscope beam.  Under AP and lateral visualization, the needle was advanced so it did not puncture dura and was located close the 6 O'Clock position of the pedical in AP tracterory. Biplanar projections were used to confirm position. Aspiration was confirmed to be negative for CSF and/or blood. A 1-2 ml. volume of Isovue-250 was injected and flow of contrast was noted at each level. Radiographs were obtained for documentation purposes.   After attaining the desired flow of contrast documented above, a 0.5 to 1.0 ml test dose of 0.25% Marcaine was injected into each respective transforaminal space.  The patient was observed for 90 seconds post injection.  After no sensory deficits were reported, and  normal lower extremity motor function was noted,   the above injectate was administered so that equal amounts of the injectate were placed at each foramen (level) into the transforaminal epidural space.   Additional Comments:  The patient tolerated the procedure well Dressing: 2 x 2 sterile gauze and  Band-Aid    Post-procedure details: Patient was observed during the procedure. Post-procedure instructions were reviewed.  Patient left the clinic in stable condition.    Clinical History: No specialty comments available.     Objective:  VS:  HT:     WT:    BMI:      BP:(!) 168/89   HR:96bpm   TEMP: ( )   RESP:  Physical Exam Vitals and nursing note reviewed.  Constitutional:      General: She is not in acute distress.    Appearance: Normal appearance. She is obese. She is not ill-appearing.  HENT:     Head: Normocephalic and atraumatic.     Right Ear: External ear normal.     Left Ear: External ear normal.  Eyes:     Extraocular Movements: Extraocular movements intact.  Cardiovascular:     Rate and Rhythm: Normal rate.     Pulses: Normal pulses.  Pulmonary:     Effort: Pulmonary effort is normal. No respiratory distress.  Abdominal:     General: There is no distension.     Palpations: Abdomen is soft.  Musculoskeletal:        General: Tenderness present.     Cervical back: Neck supple.     Right lower leg: No edema.     Left lower leg: No edema.     Comments: Patient has good distal strength with no pain over the greater trochanters.  No clonus or focal weakness.  Skin:    Findings: No erythema, lesion or rash.  Neurological:     General: No focal deficit present.     Mental Status: She is alert and oriented to person, place, and time.     Sensory: No sensory deficit.     Motor: No weakness or abnormal muscle tone.     Coordination: Coordination normal.  Psychiatric:        Mood and Affect: Mood normal.        Behavior: Behavior normal.     Imaging: No results found.

## 2021-04-14 NOTE — Procedures (Signed)
Lumbosacral Transforaminal Epidural Steroid Injection - Sub-Pedicular Approach with Fluoroscopic Guidance  Patient: Michelle Roberts      Date of Birth: November 01, 1955 MRN: [id number] PCP: Bryson Corona, NP      Visit Date: 04/02/2021   Universal Protocol:    Date/Time: 04/02/2021  Consent Given By: the patient  Position: PRONE  Additional Comments: Vital signs were monitored before and after the procedure. Patient was prepped and draped in the usual sterile fashion. The correct patient, procedure, and site was verified.   Injection Procedure Details:   Procedure diagnoses: Spinal stenosis of lumbar region with neurogenic claudication [M48.062]    Meds Administered:  Meds ordered this encounter  Medications   methylPREDNISolone acetate (DEPO-MEDROL) injection 80 mg    Laterality: Right  Location/Site: L4  Needle:5.0 in., 22 ga.  Short bevel or Quincke spinal needle  Needle Placement: Transforaminal  Findings:    -Comments: Excellent flow of contrast along the nerve, nerve root and into the epidural space.  Procedure Details: After squaring off the end-plates to get a true AP view, the C-arm was positioned so that an oblique view of the foramen as noted above was visualized. The target area is just inferior to the "nose of the scotty dog" or sub pedicular. The soft tissues overlying this structure were infiltrated with 2-3 ml. of 1% Lidocaine without Epinephrine.  The spinal needle was inserted toward the target using a "trajectory" view along the fluoroscope beam.  Under AP and lateral visualization, the needle was advanced so it did not puncture dura and was located close the 6 O'Clock position of the pedical in AP tracterory. Biplanar projections were used to confirm position. Aspiration was confirmed to be negative for CSF and/or blood. A 1-2 ml. volume of Isovue-250 was injected and flow of contrast was noted at each level. Radiographs were obtained for documentation  purposes.   After attaining the desired flow of contrast documented above, a 0.5 to 1.0 ml test dose of 0.25% Marcaine was injected into each respective transforaminal space.  The patient was observed for 90 seconds post injection.  After no sensory deficits were reported, and normal lower extremity motor function was noted,   the above injectate was administered so that equal amounts of the injectate were placed at each foramen (level) into the transforaminal epidural space.   Additional Comments:  The patient tolerated the procedure well Dressing: 2 x 2 sterile gauze and Band-Aid    Post-procedure details: Patient was observed during the procedure. Post-procedure instructions were reviewed.  Patient left the clinic in stable condition.

## 2021-04-15 MED FILL — NICOTINE TD DIS 21MG/24H: 28 days supply | Qty: 28 | Fill #0

## 2021-04-19 ENCOUNTER — Other Ambulatory Visit (HOSPITAL_BASED_OUTPATIENT_CLINIC_OR_DEPARTMENT_OTHER): Payer: Self-pay | Admitting: Family Medicine

## 2021-04-19 DIAGNOSIS — F1120 Opioid dependence, uncomplicated: Secondary | ICD-10-CM

## 2021-04-19 MED ORDER — BUPRENORPHINE HCL-NALOXONE HCL 8-2 MG SL FILM
ORAL_FILM | SUBLINGUAL | 0 refills | Status: DC
Start: 2021-04-23 — End: 2021-05-07

## 2021-04-19 NOTE — Telephone Encounter (Signed)
Filling early due to holiday.

## 2021-04-21 MED FILL — POLYETH GLYC POW 3350 NF (PACKETS): 28 days supply | Qty: 28 | Fill #1

## 2021-04-23 ENCOUNTER — Ambulatory Visit: Payer: 59 | Attending: Psychosomatic Medicine | Admitting: Clinical

## 2021-04-23 ENCOUNTER — Ambulatory Visit: Payer: 59 | Admitting: Registered Nurse

## 2021-04-23 DIAGNOSIS — F332 Major depressive disorder, recurrent severe without psychotic features: Secondary | ICD-10-CM | POA: Diagnosis not present

## 2021-04-23 DIAGNOSIS — F1120 Opioid dependence, uncomplicated: Secondary | ICD-10-CM | POA: Insufficient documentation

## 2021-04-24 ENCOUNTER — Other Ambulatory Visit (HOSPITAL_BASED_OUTPATIENT_CLINIC_OR_DEPARTMENT_OTHER): Payer: Self-pay | Admitting: Internal Medicine

## 2021-04-24 NOTE — Telephone Encounter (Signed)
PER Patient (self), Cheryl Dixon is a 65 year old female has requested a refill of polyethylene glycol (HEALTHYLAX) 17 g packet.      Last Office Visit: 10/22/2020 with Lenore Cordia  Last Physical Exam: 2015      Other Med Adult:  Most Recent BP Reading(s)  12/06/20 : 117/54        Cholesterol (mg/dL)   Date Value   10/22/2020 233     LOW DENSITY LIPOPROTEIN DIRECT (mg/dL)   Date Value   10/22/2020 158     HIGH DENSITY LIPOPROTEIN (mg/dL)   Date Value   10/22/2020 40     No results found for: TG      THYROID SCREEN TSH REFLEX FT4 (uIU/mL)   Date Value   01/12/2019 1.630         No results found for: TSH    HEMOGLOBIN A1C (%)   Date Value   01/12/2019 4.6       No results found for: POCA1C      INR (no units)   Date Value   02/16/2007 1.0 (L)   07/03/2006 < 1.0 (L)       SODIUM (mmol/L)   Date Value   10/22/2020 143       POTASSIUM (mmol/L)   Date Value   10/22/2020 4.5           CREATININE (mg/dL)   Date Value   10/22/2020 0.9       Documented patient preferred pharmacies:    [id number] - [place], [place]  Phone: [phone] Fax: [phone]

## 2021-04-24 NOTE — Progress Notes (Signed)
[facility] (OFFICE BASED OPIOID TREATMENT)  GROUP PROGRESS NOTE     Televisit consent, as applicable:  For the patient participating in the group via telephone and/or video technology: The patient/guardian has verbally consented to participate in this group therapy session by televisit. The patient/guardian was encouraged to be in a private location due to personal health information being discussed and where they could not be overheard by individuals not participating in the group therapy. Although all [facility] staff/providers participating in this group therapy televisit are in a private location, all the risks of telephone and/or video technology used to conduct this group therapy session cannot be controlled by [place], (i.e. interruptions, unauthorized access/recording and technical difficulties).  Each patient understands that the visit can be stopped at any time for any reason, including if the conferencing connections are inadequate.     GROUP NAME:  Office Based Opioid Treatment Group     LEADER(S): Cherlynn Perches, Ph.D. and [name], RN      Cheryl Dixon is a 65 year old, Divorced, female, who presented for [facility] group visit.   Length of group: 60 min which included at least 30 min of Health Behavior Intervention and OUD Psychoeducation.     Number of participants in group today: 10    Group Topic Today:  Values and Goals in Recovery. Group members discussed the importance of identifying values and setting up goals in recovery. Group members defined values as the desired directions and principles that are considered important in life. Group members defined goals as the objectives one should strive hard to achieve and the specific ways to execute values. Group members discussed how values drive the decision-making processes and help make important decisions in recovery. Group members explored why values matter in addiction recovery and reviewed concrete examples of values that can encourage healthier living for those  recovering from addiction. Group members discussed strategies to reduce relapse risks and increase healthy coping mechanisms to support recovery and healthy change.    Purpose of Group:    Education about Opioid Use Disorder (OUD) and treatment   Enhancing adjustment and coping with OUD   Improving management of OUD and treatment adherence   Supporting Recovery Process   Increasing health promoting behaviors   Decreasing health risk behaviors and risks of relapse   Addressing psychological/psychosocial/behavioral barriers to preventing addiction and relapse   Promotion of functional improvement     Group Interventions Utilized:   Psychoeducation related to the psychological, behavioral, and/ or psychosocial aspects of OUD   Motivational interviewing   Coping skills training   Cognitive restructuring   Emotional awareness and management   Operant behavior therapy and contingency management   Stimulus control   Mindfulness techniques training    Group Process:      Group rules    Review of group goals/structure   Group members discussed the importance of identifying values and setting up goals in order to be successful in recovery.    Group members discussed how values drive the decision-making processes and help make important decisions in recovery.    Group members explored why values matter in addiction recovery and reviewed concrete examples of values that can encourage healthier living for those recovering from addiction.   Group members reviewed  strategies to increase healthy coping mechanisms to support recovery and change.   Group members explored and shared additional relapse prevention strategies.    Mindfulness and self-compassion strategies discussed.   Discussed support and helpful strategies to promote recovery  Check in            Individual Patient Participation: Pt presented as engaged and participated in the group discussion. Pt shared thoughts about her personal values:  she values personal health and wishes she was able to focus on her health earlier: "We waited so long. We don't think about it when we are in the 30-es and 40-es". Pt has no concerns today;  recovery is going well. Pt was supportive to other group members. Social history update: has no heat today; reported having a good Christmas. Pt reported no relapses/close calls.      Diagnosis (addressed by this group):     Opioid dependence on agonist therapy ([place])  (primary encounter diagnosis)  Severe episode of recurrent major depressive disorder, without psychotic features ([place])           Relevant changes in mental status:   No major changes reported or observed       Plan: Pt will continue [facility] treatment     Cherlynn Perches, Ph.D.

## 2021-04-25 ENCOUNTER — Ambulatory Visit (HOSPITAL_BASED_OUTPATIENT_CLINIC_OR_DEPARTMENT_OTHER): Payer: 59 | Admitting: Gastroenterology

## 2021-04-25 MED ORDER — POLYETHYLENE GLYCOL 3350 17 G PO PACK
PACK | ORAL | 2 refills | Status: DC
Start: 2021-04-25 — End: 2021-09-11

## 2021-04-28 HISTORY — PX: COLONOSCOPY: SHX174

## 2021-05-02 MED FILL — INCRUSE ELPT INH 62.5MCG: 30 days supply | Qty: 30 | Fill #0

## 2021-05-07 ENCOUNTER — Other Ambulatory Visit (HOSPITAL_BASED_OUTPATIENT_CLINIC_OR_DEPARTMENT_OTHER): Payer: Self-pay | Admitting: Family Medicine

## 2021-05-07 DIAGNOSIS — F1120 Opioid dependence, uncomplicated: Secondary | ICD-10-CM

## 2021-05-07 MED ORDER — BUPRENORPHINE HCL-NALOXONE HCL 8-2 MG SL FILM
ORAL_FILM | SUBLINGUAL | 0 refills | Status: DC
Start: 2021-05-07 — End: 2021-05-21

## 2021-05-07 NOTE — Telephone Encounter (Signed)
PER Patient (self), Cheryl Dixon is a 66 year old female has requested a refill of      - SUBOXONE) 8-2 MG sublingual film      Start Date: 04/23/21 End Date: 05/07/21     Last Office Visit: 04/23/21 with Deidre Ala  Last Physical Exam: 05/27/2013     COLONOSCOPY due on 06/01/2021     Other Med Adult:  Most Recent BP Reading(s)  12/06/20 : 117/54        Cholesterol (mg/dL)   Date Value   10/22/2020 233     LOW DENSITY LIPOPROTEIN DIRECT (mg/dL)   Date Value   10/22/2020 158     HIGH DENSITY LIPOPROTEIN (mg/dL)   Date Value   10/22/2020 40     No results found for: TG      THYROID SCREEN TSH REFLEX FT4 (uIU/mL)   Date Value   01/12/2019 1.630         No results found for: TSH    HEMOGLOBIN A1C (%)   Date Value   01/12/2019 4.6       No results found for: POCA1C      INR (no units)   Date Value   02/16/2007 1.0 (L)   07/03/2006 < 1.0 (L)       SODIUM (mmol/L)   Date Value   10/22/2020 143       POTASSIUM (mmol/L)   Date Value   10/22/2020 4.5           CREATININE (mg/dL)   Date Value   10/22/2020 0.9        Documented patient preferred pharmacies:    [id number] [place], [place] - [place]  Phone: [phone] Fax: [phone]

## 2021-05-08 ENCOUNTER — Telehealth (HOSPITAL_BASED_OUTPATIENT_CLINIC_OR_DEPARTMENT_OTHER): Payer: Self-pay | Admitting: Family Medicine

## 2021-05-08 NOTE — Telephone Encounter (Signed)
-----   Message from [place] sent at 05/08/2021 11:13 AM EST -----  Regarding: suboxone - early fill  Contact: [place] [phone], 66 year old, female    Calls today:  Clinical Questions (NON-SICK CLINICAL QUESTIONS ONLY)    Name of person calling Patient  Specific nature of request needs provider to OKAY early fill for Suboxone. Phamacy will not fill it until Friday based on last pick up date but patient said she was out of meds for 3 days and had to "borrow" medication from someone else and now has to give them their suboxone back so she is out of meds and needs them tomorrow. Please call patient to discuss further with any questions or call pharmacy to ok an early fill   Return phone number [phone]  Person calling on behalf of patient: Patient (self)      Patient's language of care: English    Patient does not need an interpreter.    Patient's PCP: [name], MD

## 2021-05-08 NOTE — Telephone Encounter (Signed)
Tried to reach out to Dolorez to check in on how she is doing.  I asked her to reach out to my office or to [facility] to let us know more about what's going on and how she is doing with her medications.    Will route this to [place], as well.  In the meantime, will approve early fill.    [place] [facility] RN team--can you please call the pharmacy and approve the early pick up this month?  Thanks!

## 2021-05-08 NOTE — Telephone Encounter (Signed)
Attempted to contact pharmacy. Was on hold for over 10 minutes.

## 2021-05-12 MED FILL — NICOTINE TD DIS 21MG/24H: 28 days supply | Qty: 28 | Fill #0

## 2021-05-14 ENCOUNTER — Other Ambulatory Visit: Payer: Self-pay

## 2021-05-14 ENCOUNTER — Encounter (INDEPENDENT_AMBULATORY_CARE_PROVIDER_SITE_OTHER): Payer: Self-pay | Admitting: Family Medicine

## 2021-05-14 ENCOUNTER — Ambulatory Visit (INDEPENDENT_AMBULATORY_CARE_PROVIDER_SITE_OTHER): Payer: 59 | Admitting: Family Medicine

## 2021-05-14 VITALS — BP 140/80 | HR 79 | Temp 98.0°F | Ht <= 58 in | Wt 188.0 lb

## 2021-05-14 DIAGNOSIS — K5900 Constipation, unspecified: Secondary | ICD-10-CM

## 2021-05-14 DIAGNOSIS — E119 Type 2 diabetes mellitus without complications: Secondary | ICD-10-CM

## 2021-05-14 DIAGNOSIS — Z6839 Body mass index (BMI) 39.0-39.9, adult: Secondary | ICD-10-CM

## 2021-05-14 DIAGNOSIS — E669 Obesity, unspecified: Secondary | ICD-10-CM

## 2021-05-14 DIAGNOSIS — E1169 Type 2 diabetes mellitus with other specified complication: Secondary | ICD-10-CM | POA: Diagnosis not present

## 2021-05-14 DIAGNOSIS — Z9189 Other specified personal risk factors, not elsewhere classified: Secondary | ICD-10-CM

## 2021-05-14 DIAGNOSIS — Z6841 Body Mass Index (BMI) 40.0 and over, adult: Secondary | ICD-10-CM

## 2021-05-16 ENCOUNTER — Ambulatory Visit
Admission: RE | Admit: 2021-05-16 | Discharge: 2021-05-16 | Disposition: A | Discharge status: Home / Self Care | Payer: 59 | Attending: Internal Medicine | Admitting: Internal Medicine

## 2021-05-16 ENCOUNTER — Other Ambulatory Visit (HOSPITAL_BASED_OUTPATIENT_CLINIC_OR_DEPARTMENT_OTHER): Payer: Self-pay | Admitting: Internal Medicine

## 2021-05-16 ENCOUNTER — Other Ambulatory Visit: Payer: Self-pay

## 2021-05-16 DIAGNOSIS — M810 Age-related osteoporosis without current pathological fracture: Secondary | ICD-10-CM

## 2021-05-16 DIAGNOSIS — Z780 Asymptomatic menopausal state: Secondary | ICD-10-CM | POA: Diagnosis present

## 2021-05-16 NOTE — Progress Notes (Signed)
Chief Complaint:   OBESITY Barbara is here to discuss her progress with her obesity treatment plan along with follow-up of her obesity related diagnoses. Amy is on the [employer] with 85 grams of protein daily and states she is following her eating plan approximately 30% of the time. Ercie states she is doing 0 minutes 0 times per week.  Today's visit was #: 10 Starting weight: 202 lbs Starting date: 06/22/2020 Today's weight: 188 lbs Today's date: 05/14/2021 Total lbs lost to date: 14 Total lbs lost since last in-office visit: 0  Interim History: Lashell has had some extra challenges over the holidays, and with traveling. She is working on getting back to meal planning. She is struggling to meet her protein goals.  Subjective:   1. Constipation, unspecified constipation type Verdis Frederickson notes her constipation is worse with iron supplement and Mounjaro. She is working on increasing her water intake.  2. Type 2 diabetes mellitus without complication, without long-term current use of insulin (HCC) Benigna is working on decreasing simple carbohydrates, but she has struggled over the holidays. She is working on getting back on track.  3. At risk for impaired metabolic function Phylisha is at increased risk for impaired metabolic function if protein decreases.  Assessment/Plan:   1. Constipation, unspecified constipation type Kasyn is to take miralax 17 grams daily and increase her water intake. She was informed that a decrease in bowel movement frequency is normal while losing weight, but stools should not be hard or painful. Orders and follow up as documented in patient record.   2. Type 2 diabetes mellitus without complication, without long-term current use of insulin ([place]) Lynzi will continue [facility] and her eating plan. We will continue follow. Good blood sugar control is important to decrease the likelihood of diabetic complications such as nephropathy, neuropathy, limb loss,  blindness, coronary artery disease, and death. Intensive lifestyle modification including diet, exercise and weight loss are the first line of treatment for diabetes.   3. At risk for impaired metabolic function Markiah was given approximately 15 minutes of impaired  metabolic function prevention counseling today. We discussed intensive lifestyle modifications today with an emphasis on specific nutrition and exercise instructions and strategies.   Repetitive spaced learning was employed today to elicit superior memory formation and behavioral change.  4. Obesity with current BMI 39.3 Bethanne is currently in the action stage of change. As such, her goal is to continue with weight loss efforts. She has agreed to keeping a food journal and adhering to recommended goals of 1200-1500 calories and 85+ grams of protein daily.   Protein rich meal ideas were discussed.  Behavioral modification strategies: increasing lean protein intake.  Malynda has agreed to follow-up with our clinic in 4 weeks. She was informed of the importance of frequent follow-up visits to maximize her success with intensive lifestyle modifications for her multiple health conditions.   Objective:   Blood pressure 140/80, pulse 79, temperature 98 F (36.7 C), height 4\' 10"  (1.473 m), weight 188 lb (85.3 kg), SpO2 100 %. Body mass index is 39.29 kg/m.  General: Cooperative, alert, well developed, in no acute distress. HEENT: Conjunctivae and lids unremarkable. Cardiovascular: Regular rhythm.  Lungs: Normal work of breathing. Neurologic: No focal deficits.   Lab Results  Component Value Date   CREATININE 1.0 10/04/2020   BUN 22 (A) 10/04/2020   NA 140 09/19/2020   K 4.3 09/19/2020   CL 95 (L) 09/19/2020   CO2 24 09/19/2020  Lab Results  Component Value Date   ALT 22 10/04/2020   AST 23 10/04/2020   ALKPHOS 73 10/04/2020   BILITOT 0.5 09/19/2020   Lab Results  Component Value Date   HGBA1C 6.7 (H) 09/19/2020    Lab Results  Component Value Date   INSULIN 9.3 09/19/2020   Lab Results  Component Value Date   TSH 0.07 (A) 10/04/2020   Lab Results  Component Value Date   CHOL 232 (H) 09/19/2020   HDL 95 09/19/2020   LDLCALC 123 (H) 09/19/2020   TRIG 85 09/19/2020   Lab Results  Component Value Date   VD25OH 56.1 09/19/2020   VD25OH 46.7 05/01/2015   Lab Results  Component Value Date   WBC 8.9 10/04/2020   HGB 13.3 10/04/2020   HCT 42 10/04/2020   MCV 80 09/19/2020   PLT 252 10/04/2020   Lab Results  Component Value Date   FERRITIN 456 10/04/2020   Attestation Statements:   Reviewed by clinician on day of visit: allergies, medications, problem list, medical history, surgical history, family history, social history, and previous encounter notes.   I, Trixie Dredge, am acting as transcriptionist for Dennard Nip, MD.  I have reviewed the above documentation for accuracy and completeness, and I agree with the above. -  Dennard Nip, MD

## 2021-05-21 ENCOUNTER — Ambulatory Visit: Payer: 59 | Attending: Family Medicine | Admitting: Family Medicine

## 2021-05-21 ENCOUNTER — Ambulatory Visit: Payer: 59 | Attending: Psychosomatic Medicine | Admitting: Clinical

## 2021-05-21 ENCOUNTER — Encounter (HOSPITAL_BASED_OUTPATIENT_CLINIC_OR_DEPARTMENT_OTHER): Payer: Self-pay

## 2021-05-21 DIAGNOSIS — F1120 Opioid dependence, uncomplicated: Secondary | ICD-10-CM | POA: Insufficient documentation

## 2021-05-21 DIAGNOSIS — F3341 Major depressive disorder, recurrent, in partial remission: Secondary | ICD-10-CM | POA: Insufficient documentation

## 2021-05-21 MED ORDER — BUPRENORPHINE HCL-NALOXONE HCL 8-2 MG SL FILM
ORAL_FILM | SUBLINGUAL | 0 refills | Status: DC
Start: 2021-05-21 — End: 2021-06-04

## 2021-05-21 NOTE — Progress Notes (Signed)
Khari Lett is a 66 year old female seen in follow up for opioid dependence:    Buprenorphine dose: 20mg   Response, adequacy of dose: good  Relapses/close calls: none  Trigger: denies  Meetings: none currently    Social history/events:  Anxiety much improved since kids got older. No longer a big part of her experience.  Content with herself, sleeping and eating well.    Present Medications:  buprenorphine-naloxone (SUBOXONE) 8-2 MG sublingual film, Take 2.5 films under the tongue daily., Disp: 35 Film, Rfl: 0  polyethylene glycol (HEALTHYLAX) 17 g packet, TAKE 17G BY MOUTH DAILY MIX IN 64 OUNCE OF CLEAR LIQUID AND TAKE as directed, Disp: 28 packet, Rfl: 2  estradiol (ESTRACE VAGINAL) 0.1 MG/GM vaginal cream, Place 5 g vaginally 3 (three) times a week [employer] brand only., Disp: 170 g, Rfl: 1  INCRUSE ELLIPTA 62.5 MCG/INH inhaler, INHALE 1 PUFF BY MOUTH INTO LUNGS DAILY, Disp: 30 each, Rfl: 11  buPROPion (WELLBUTRIN SR) 200 MG 12 hr tablet, Take 1 tablet by mouth in the morning and 1 tablet before bedtime., Disp: 180 tablet, Rfl: 3  albuterol HFA 108 (90 Base) MCG/ACT inhaler, INHALE 2 PUFFS INTO THE LUNGS EVERY 6 HOURS AS NEEDED FOR WHEEZING OR SHORTNESS OF BREATH, Disp: 8.5 g, Rfl: 11  polyethylene glycol (GOLYTELY) 236 g suspension, Take 4,000 mLs by mouth See Admin Instructions Take as directed prior to your colonoscopy, Disp: 4000 mL, Rfl: 0  Simethicone 80 MG TABS, Take 4 tablets by mouth See Admin Instructions Take as directed prior to colonoscopy., Disp: 4 tablet, Rfl: 0  naproxen (NAPROSYN) 500 MG tablet, TAKE 1 TABLET BY MOUTH WITH FOOD EVERY TWELVE HOURS AS NEEDED FOR PAIN, Disp: 20 tablet, Rfl: 0  cholecalciferol (VITAMIN D3) 1000 UNIT tablet, Take 1,000 Units by mouth daily, Disp: , Rfl:   acetaminophen (TYLENOL) 500 MG tablet, Take 500 mg by mouth every 6 (six) hours as needed for Pain, Disp: , Rfl:   ipratropium-albuterol (DUO-NEB) 0.5-2.5 (3) MG/3ML SOLN Inhalation Solution, Take 3 mLs by nebulization 4  (four) times daily., Disp: 180 mL, Rfl: 0    No current facility-administered medications on file prior to visit.      PHQ-9 TOTAL SCORE 01/12/2019 06/01/2018 03/03/2017   Doc FlowSheet Total Score - - -   Doc FlowSheet Total Score 10 6 4            PHYSICAL EXAMINATION via video:  General appearance - healthy female in no distress  Neuro - nonfocal  Affect - normal  Behavior without evidence of intoxication or impairment    PMP Reviewed.  No unauthorized prescriptions since last refill.     Assessment:  Opioid Dependence  Comment: Anxiety has improved over life.  That has been a real benefit for her recovery.  Plan:  Continue buprenorphine, reviewed criteria for tapering when patient is ready. Discussed sources of support.  Patient is counseled regarding relapse prevention, involvement in recovery groups, potential side effects of buprenorphine, and eventual taper of medication.    For the patient participating in the group via telephone and/or video technology: the patient/guardian has verbally consented to participate in this group therapy session by televisit. The patient/guardian was encouraged to be in a private location due to personal health information being discussed and where they could not be overheard by individuals not participating in the group therapy. Although all [facility] staff/providers participating in this group therapy televisit are in a private location, all the risks of telephone and/or video technology used  to conduct this group therapy session cannot be controlled by [facility], (i.e. interruptions, unauthorized access/recording and technical difficulties).  Each patient understands that the visit can be stopped at any time for any reason, including if the conferencing connections are inadequate.       Geni Bers, MD, 05/21/2021

## 2021-05-23 ENCOUNTER — Encounter (HOSPITAL_BASED_OUTPATIENT_CLINIC_OR_DEPARTMENT_OTHER): Payer: Self-pay | Admitting: Internal Medicine

## 2021-05-23 DIAGNOSIS — M810 Age-related osteoporosis without current pathological fracture: Secondary | ICD-10-CM | POA: Insufficient documentation

## 2021-05-23 NOTE — Progress Notes (Signed)
OBOT (OFFICE BASED OPIOID TREATMENT)  GROUP PROGRESS NOTE     Televisit consent, as applicable:  For the patient participating in the group via telephone and/or video technology: The patient/guardian has verbally consented to participate in this group therapy session by televisit. The patient/guardian was encouraged to be in a private location due to personal health information being discussed and where they could not be overheard by individuals not participating in the group therapy. Although all [facility] staff/providers participating in this group therapy televisit are in a private location, all the risks of telephone and/or video technology used to conduct this group therapy session cannot be controlled by [facility], (i.e. interruptions, unauthorized access/recording and technical difficulties).  Each patient understands that the visit can be stopped at any time for any reason, including if the conferencing connections are inadequate.     GROUP NAME:  Office Based Opioid Treatment Group     LEADER(S): Hampton Abbot, MD, Cherlynn Perches, Ph.D, [name], RN      Cheryl Dixon is a 66 year old, Divorced, female, who presented for [facility] group visit.   Length of group: 60 min which included 30 min of Health Behavior Intervention and OUD Psychoeducation.     Number of participants in group today: 10    Group Topic Today:  Anxiety and Relapse Prevention. The group reviewed and dicussed symptoms of Anxiety such as nervousness, worry, negative thinking, avoidance behaviors as well as possible physiological symptoms of arousal, including heart palpitations and shortness of breath. Group members learned about various Anxiety Disorders. Groups members discussed how anxiety symptoms can create a high risk situation for relapse, and high risk situations for relapse associated with anxiety reviewed. Group members reviewed available treatments, including CBT therapy and medications. Group members discussed effective strategies for coping  with Anxiety. Group members discussed strategies to reduce relapse risks and increase healthy coping mechanisms to support recovery and healthy change.    Purpose of Group:    Education about Opioid Use Disorder (OUD) and treatment   Enhancing adjustment and coping with OUD   Improving management of OUD and treatment adherence   Supporting Recovery Process   Increasing health promoting behaviors   Decreasing health risk behaviors and risks of relapse   Addressing psychological/psychosocial/behavioral barriers to preventing addiction and relapse   Promotion of functional improvement     Group Interventions Utilized:   Psychoeducation related to the psychological, behavioral, and/ or psychosocial aspects of OUD   Motivational interviewing   Coping skills training   Cognitive restructuring   Emotional awareness and management   Operant behavior therapy and contingency management   Stimulus control   Mindfulness techniques training    Group Process:      Group rules    Review of group goals/structure.    Group members reviewed and dicussed symptoms of Anxiety such as nervousness, worry, negative thinking, avoidance behaviors as well as possible physiological symptoms of arousal, including heart palpitations and shortness of breath.    Group members learned about various Anxiety Disorders.    Groups members discussed how anxiety symptoms can create a high risk situation for relapse, and examples of high risk situations for relapse associated with anxiety have been reviewed.    Group members reviewed available treatments, including CBT therapy and medications.    Group members discussed effective strategies for coping with Anxiety.    Group members reviewed  strategies to increase healthy coping mechanisms to support recovery and change.   Group members explored and shared  additional relapse prevention strategies.    Mindfulness and self-compassion strategies discussed.   Check  in            Individual Patient Participation:   Level of participation in the group: Pt presented as engaged and participated in the group discussion; she was supportive to other group members.  Subjective report of pt's progress: Pt reported doing well. Pt shared that she used to have anxiety in the past and is doing much better now: "My kids are grown ups, and I feel less anxious". Pt is focusing on self-care and health: "I sleep well, eat well, and go for a walk". In regards to recovery, pt has no concerns today;  recovery is going well.  Social History update: no major updates reported  Participation in meetings outside of this group: Not currently  Report on relapses and close calls: Pt reported no relapses/close calls.   Mood seems neutral   Relevant changes in mental status: No major changes reported or observed    Diagnosis (addressed by this group):     Opioid dependence on agonist therapy (HCC)  MDD (major depressive disorder), recurrent, in partial remission [facility]    Medical Necessity of Session (how treatment is necessary to improve symptoms, functioning, or prevent worsening): treatment in necessary to enhance compliance with agonist treatment and maintain abstinence from opioids. Suboxone group participation is mandatory to continue Suboxone prescription and is necessary in order to enhance coping with Opioid Use Disorder.            Plan: Pt will continue OBOT treatment     Cherlynn Perches, Ph.D.

## 2021-05-23 NOTE — BH OP Treatment Plan (Signed)
Patient/Guardian:  contributed to the creation of the treatment plan.    Strengths/Skills: Help seeking and motivated to get better     Potential Barriers: Multiple life stressors       Patient stated goals: Pt would like to prevent relapses and support her recovery process     Problem 1: Opioid Dependence on Agonist Therapy (Opioid Use Disorder on Agonist Therapy)      Short Term Goals: Pt will use at least one copying skill a day to reduce a risk of relapse, enhance coping with OUD, and support her recovery process     Short Term Target:  60 days  Short TermTarget Date: 07/20/2021  Short Term Goal #1 Progress: progressing     Long Term Goals: Pt will report 50% in urges and cravings reduction and no relapses or close calls       Long Term Target:  90 days  Long TermTarget Date:  08/19/2021   Long Term Goal #1 Progress:  progressing      Intervention #1: PCBHI-based OBOT/Group Psychotherapy   Intervention Frequency:  biweekly  Intervention Duration:  90 Days  Intervention Responsibility:  J. Verdis Frederickson, PhD and the OBOT team

## 2021-05-24 ENCOUNTER — Telehealth (HOSPITAL_BASED_OUTPATIENT_CLINIC_OR_DEPARTMENT_OTHER): Payer: Self-pay | Admitting: Licensed Practical Nurse

## 2021-05-24 ENCOUNTER — Other Ambulatory Visit (HOSPITAL_BASED_OUTPATIENT_CLINIC_OR_DEPARTMENT_OTHER): Payer: Self-pay | Admitting: Internal Medicine

## 2021-05-24 MED ORDER — CHOLECALCIFEROL 50 MCG (2000 UT) PO TABS
2000.0000 [IU] | ORAL_TABLET | Freq: Every day | ORAL | 3 refills | Status: DC
Start: 2021-05-24 — End: 2022-07-20

## 2021-05-24 MED ORDER — ALENDRONATE SODIUM 70 MG PO TABS
ORAL_TABLET | ORAL | 3 refills | Status: DC
Start: 2021-05-24 — End: 2021-10-02

## 2021-05-24 NOTE — Telephone Encounter (Addendum)
Relayed message to patient   She voiced understanding   No questions                 ----- Message from Lonia Blood sent at 05/24/2021 10:04 AM EST -----    ----- Message -----  From: [name], MD  Sent: 05/24/2021  12:04 AM EST  To: Benjie Karvonen Pool    Dear RN,    Please let patient know that the bone density scan shows osteoporosis, thinning of hte bones.  It is at a level where I would recommend starting additional medications to help manage it.      She should take 2000 units of vitamin D a day if not already taking it.  I would also recommend starting alendronate once a week.  You take the medication 30 minutes before breakfast, and do not lie down for 30 minutes after taking it.     I sent prescriptions to the pharmacy.  We should recheck bone density in about two years.

## 2021-05-24 NOTE — Progress Notes (Signed)
Dear RN,    Please let patient know that the bone density scan shows osteoporosis, thinning of hte bones.  It is at a level where I would recommend starting additional medications to help manage it.      She should take 2000 units of vitamin D a day if not already taking it.  I would also recommend starting alendronate once a week.  You take the medication 30 minutes before breakfast, and do not lie down for 30 minutes after taking it.     I sent prescriptions to the pharmacy.  We should recheck bone density in about two years.

## 2021-06-04 ENCOUNTER — Ambulatory Visit (HOSPITAL_BASED_OUTPATIENT_CLINIC_OR_DEPARTMENT_OTHER): Payer: 59 | Admitting: Family Medicine

## 2021-06-04 ENCOUNTER — Other Ambulatory Visit (HOSPITAL_BASED_OUTPATIENT_CLINIC_OR_DEPARTMENT_OTHER): Payer: Self-pay | Admitting: Family Medicine

## 2021-06-04 DIAGNOSIS — F1120 Opioid dependence, uncomplicated: Secondary | ICD-10-CM

## 2021-06-04 MED ORDER — BUPRENORPHINE HCL-NALOXONE HCL 8-2 MG SL FILM
ORAL_FILM | SUBLINGUAL | 0 refills | Status: DC
Start: 2021-06-04 — End: 2021-06-18

## 2021-06-04 NOTE — Telephone Encounter (Signed)
PER Patient (self), [name] is a 66 year old female has requested a refill of      - suboxone 8-49film - start date: 05-21-21 end date: 06-04-21    Last Office Visit: 05-21-21 with elana bloomfield  Last Physical Exam: 05-27-13      Other Med Adult:  Most Recent BP Reading(s)  12/06/20 : 117/54        Cholesterol (mg/dL)   Date Value   10/22/2020 233     LOW DENSITY LIPOPROTEIN DIRECT (mg/dL)   Date Value   10/22/2020 158     HIGH DENSITY LIPOPROTEIN (mg/dL)   Date Value   10/22/2020 40     No results found for: TG      THYROID SCREEN TSH REFLEX FT4 (uIU/mL)   Date Value   01/12/2019 1.630         No results found for: TSH    HEMOGLOBIN A1C (%)   Date Value   01/12/2019 4.6       No results found for: POCA1C      INR (no units)   Date Value   02/16/2007 1.0 (L)   07/03/2006 < 1.0 (L)       SODIUM (mmol/L)   Date Value   10/22/2020 143       POTASSIUM (mmol/L)   Date Value   10/22/2020 4.5           CREATININE (mg/dL)   Date Value   10/22/2020 0.9       Documented patient preferred pharmacies:    [id number] [address] [place]  Phone: [phone] Fax: [phone]

## 2021-06-06 MED FILL — NICOTINE TD DIS 21MG/24H: 28 days supply | Qty: 28 | Fill #0

## 2021-06-11 ENCOUNTER — Ambulatory Visit (INDEPENDENT_AMBULATORY_CARE_PROVIDER_SITE_OTHER): Payer: 59 | Admitting: Family Medicine

## 2021-06-11 ENCOUNTER — Other Ambulatory Visit: Payer: Self-pay

## 2021-06-11 ENCOUNTER — Encounter (INDEPENDENT_AMBULATORY_CARE_PROVIDER_SITE_OTHER): Payer: Self-pay | Admitting: Family Medicine

## 2021-06-11 VITALS — BP 156/73 | HR 100 | Temp 98.2°F | Ht <= 58 in | Wt 189.0 lb

## 2021-06-11 DIAGNOSIS — Z7984 Long term (current) use of oral hypoglycemic drugs: Secondary | ICD-10-CM

## 2021-06-11 DIAGNOSIS — Z6841 Body Mass Index (BMI) 40.0 and over, adult: Secondary | ICD-10-CM

## 2021-06-11 DIAGNOSIS — E119 Type 2 diabetes mellitus without complications: Secondary | ICD-10-CM

## 2021-06-11 DIAGNOSIS — Z6839 Body mass index (BMI) 39.0-39.9, adult: Secondary | ICD-10-CM

## 2021-06-11 DIAGNOSIS — E669 Obesity, unspecified: Secondary | ICD-10-CM

## 2021-06-11 NOTE — Progress Notes (Signed)
Chief Complaint:   OBESITY Michelle Roberts is here to discuss her progress with her obesity treatment plan along with follow-up of her obesity related diagnoses. Michelle Roberts is on keeping a food journal and adhering to recommended goals of 1200-1500 calories and 85+ grams of protein daily and states she is following her eating plan approximately 20% of the time. Michelle Roberts states she is doing 0 minutes 0 times per week.  Today's visit was #: 11 Starting weight: 202 lbs Starting date: 06/22/2020 Today's weight: 189 lbs Today's date: 06/11/2021 Total lbs lost to date: 13 Total lbs lost since last in-office visit: 0  Interim History: Michelle Roberts has had multiple health issues recently, and she has struggled with journaling and meal planning. She has done well mostly maintaining her weight. She has a form to fill out to get into an exercise program.   Subjective:   1. Type 2 diabetes mellitus without complication, without long-term current use of insulin ([place]) Michelle Roberts started [place], but she had significant side effects especially with constipation, and she had to stop. She continues to work on diet, exercise, and weight loss.   Assessment/Plan:   1. Type 2 diabetes mellitus without complication, without long-term current use of insulin ([place]) Michelle Roberts agreed to discontinue [facility], and she will continue metformin. Good blood sugar control is important to decrease the likelihood of diabetic complications such as nephropathy, neuropathy, limb loss, blindness, coronary artery disease, and death. Intensive lifestyle modification including diet, exercise and weight loss are the first line of treatment for diabetes.   2. Obesity with current BMI 39.5 Michelle Roberts is currently in the action stage of change. As such, her goal is to continue with weight loss efforts. She has agreed to keeping a food journal and adhering to recommended goals of 1200-1500 calories and 85+ grams of protein daily.   Behavioral modification strategies:  increasing lean protein intake and decreasing liquid calories.  Michelle Roberts has agreed to follow-up with our clinic in 8 weeks. She was informed of the importance of frequent follow-up visits to maximize her success with intensive lifestyle modifications for her multiple health conditions.   Objective:   Blood pressure (!) 156/73, pulse 100, temperature 98.2 F (36.8 C), height 4\' 10"  (1.473 m), weight 189 lb (85.7 kg), SpO2 99 %. Body mass index is 39.5 kg/m.  General: Cooperative, alert, well developed, in no acute distress. HEENT: Conjunctivae and lids unremarkable. Cardiovascular: Regular rhythm.  Lungs: Normal work of breathing. Neurologic: No focal deficits.   Lab Results  Component Value Date   CREATININE 1.0 10/04/2020   BUN 22 (A) 10/04/2020   NA 140 09/19/2020   K 4.3 09/19/2020   CL 95 (L) 09/19/2020   CO2 24 09/19/2020   Lab Results  Component Value Date   ALT 22 10/04/2020   AST 23 10/04/2020   ALKPHOS 73 10/04/2020   BILITOT 0.5 09/19/2020   Lab Results  Component Value Date   HGBA1C 6.7 (H) 09/19/2020   Lab Results  Component Value Date   INSULIN 9.3 09/19/2020   Lab Results  Component Value Date   TSH 0.07 (A) 10/04/2020   Lab Results  Component Value Date   CHOL 232 (H) 09/19/2020   HDL 95 09/19/2020   LDLCALC 123 (H) 09/19/2020   TRIG 85 09/19/2020   Lab Results  Component Value Date   VD25OH 56.1 09/19/2020   VD25OH 46.7 05/01/2015   Lab Results  Component Value Date   WBC 8.9 10/04/2020   HGB 13.3  10/04/2020   HCT 42 10/04/2020   MCV 80 09/19/2020   PLT 252 10/04/2020   Lab Results  Component Value Date   FERRITIN 456 10/04/2020   Attestation Statements:   Reviewed by clinician on day of visit: allergies, medications, problem list, medical history, surgical history, family history, social history, and previous encounter notes.  Time spent on visit including pre-visit chart review and post-visit care and charting was 33 minutes.     I, Trixie Dredge, am acting as transcriptionist for Dennard Nip, MD.  I have reviewed the above documentation for accuracy and completeness, and I agree with the above. -  Dennard Nip, MD

## 2021-06-13 ENCOUNTER — Telehealth (HOSPITAL_BASED_OUTPATIENT_CLINIC_OR_DEPARTMENT_OTHER): Payer: Self-pay

## 2021-06-13 NOTE — Telephone Encounter (Signed)
-----   Message from [place] Degree sent at 06/13/2021  3:09 PM EST -----  Regarding: URGENT/Requesting Call Back from RN Paula/Assistance with Transportation  Contact: [place] [phone], 66 year old, female    Calls today:  Clinical Questions ([place])    Name of person calling Cheryl Dixon (Patient)    Specific nature of request URGENT/  Pt is requesting a call back from [place] in regards to getting help for Transportation for tomorrow's appt. Writer offered pt the [place] phone number but pt stated that she is having severe problems with her knees and that there was another service that was provided for her prior. Please call pt back, Thank You.    Return phone number [phone]  Person calling on behalf of patient: Patient (self)      Patient's language of care: English    Patient does not need an interpreter.    Patient's PCP: [name], MD

## 2021-06-13 NOTE — Telephone Encounter (Signed)
Returned call and spoke to pt who reports that she was looking for the phone number of the cab service that she had used before.  States that she has an appt tomorrow and needs to set up transportation.  States that she had used it back in November either 2022 or 2021, pt could not remember.     Chart review and unable to find name of cab service that pt had used back then.   Pt states that she has been calling different places with no success.  Not sure if she qualified for PT1 as she has a bad knee.     Advised pt that will send message to PT1 team for assistance.  In the meantime, provided pt with The ride phone number to call and inquire about setting transportation.  Pt states that she will call her son and ask if he can take her to appt tomorrow, if not will r/s as we work on setting PT1 for pt.    She appreciates call and has no further questions.

## 2021-06-13 NOTE — Telephone Encounter (Signed)
-----   Message from [place] Degree sent at 06/13/2021  3:09 PM EST -----  Regarding: URGENT/Requesting Call Back from RN Paula/Assistance with Transportation  Contact: Hall [phone], 66 year old, female    Calls today:  Clinical Questions ([place])    Name of person calling Cheryl Dixon (Patient)    Specific nature of request URGENT/  Pt is requesting a call back from [place] in regards to getting help for Transportation for tomorrow's appt. Writer offered pt the [place] phone number but pt stated that she is having severe problems with her knees and that there was another service that was provided for her prior. Please call pt back, Thank You.    Return phone number [phone]  Person calling on behalf of patient: Patient (self)      Patient's language of care: English    Patient does not need an interpreter.    Patient's PCP: [name], MD

## 2021-06-14 ENCOUNTER — Encounter (HOSPITAL_BASED_OUTPATIENT_CLINIC_OR_DEPARTMENT_OTHER): Payer: Self-pay | Admitting: Specialist

## 2021-06-14 ENCOUNTER — Ambulatory Visit (HOSPITAL_BASED_OUTPATIENT_CLINIC_OR_DEPARTMENT_OTHER): Payer: 59 | Admitting: Specialist

## 2021-06-14 ENCOUNTER — Other Ambulatory Visit: Payer: Self-pay

## 2021-06-14 ENCOUNTER — Ambulatory Visit
Admission: RE | Admit: 2021-06-14 | Discharge: 2021-06-14 | Disposition: A | Discharge status: Home / Self Care | Payer: 59 | Source: Ambulatory Visit | Attending: Specialist | Admitting: Specialist

## 2021-06-14 DIAGNOSIS — M21162 Varus deformity, not elsewhere classified, left knee: Secondary | ICD-10-CM

## 2021-06-14 DIAGNOSIS — M25561 Pain in right knee: Secondary | ICD-10-CM | POA: Diagnosis present

## 2021-06-14 DIAGNOSIS — M1711 Unilateral primary osteoarthritis, right knee: Secondary | ICD-10-CM | POA: Insufficient documentation

## 2021-06-14 DIAGNOSIS — M170 Bilateral primary osteoarthritis of knee: Secondary | ICD-10-CM | POA: Diagnosis present

## 2021-06-14 DIAGNOSIS — M21161 Varus deformity, not elsewhere classified, right knee: Secondary | ICD-10-CM | POA: Diagnosis present

## 2021-06-14 DIAGNOSIS — I8392 Asymptomatic varicose veins of left lower extremity: Secondary | ICD-10-CM

## 2021-06-14 MED ORDER — NAPROXEN 500 MG PO TBEC
500.0000 mg | DELAYED_RELEASE_TABLET | Freq: Two times a day (BID) | ORAL | 2 refills | Status: DC
Start: 2021-06-14 — End: 2021-09-09

## 2021-06-14 NOTE — Progress Notes (Signed)
Orthopedic Office Note    CC: Patient presents with:  Follow Up: Bilateral knee pain, right > left      ORTHOPEDIC PROBLEM LIST:  1.  Bilateral knee osteoarthritis    HPI: Cheryl Dixon is a 66 year old female who is here today for follow up of bilateral knee pain.  At her last appointment, patient had presented with chronic right knee pain; x-ray machine was down so x-rays could not be taken but she was clinically suspicious for osteoarthritis.  She underwent a cortisone injectio in December 2021.  Patient feels that that injection took around 3 to 4 weeks to kick in; at the same time she started working out which she feels helped the shot work immensely.  She had good relief for several months after having the injection before return of symptoms.  She began taking omega-3, using ice, ginger and taking Naprosyn.  She feels these all have all helped to some degree but her right knee pain has persisted.  She feels that she cannot take Tylenol or NSAIDs other than Naprosyn because it often makes her feel like her lower legs are swollen.  Feels her left knee has become problematic over the last month; she has had a history for shots in this knee in the past but they were several years ago.  She would like to consider surgical intervention for her knee pain if the arthritis is bad enough.  Localizes her pain to the medial aspect of bilateral knees as well as the anterior aspect when going up and down stairs.  Denies any numbness or tingling into her lower extremities.  No feelings of instability.  She is active working out and biking on a recumbent bike at home which helps her significantly.  She can only walk for short durations of time before having to stop due to pain.     Past medical history significant for COPD for which she has a pulmonologist.  She was also recently diagnosed with osteoporosis after having a DEXA scan on 05/16/2021.  She has a history for opioid dependence and is currently on Suboxone managed by  her PCP Dr. Lisa Roca.  States she will occasionally try an edible (marijuana), but denies other recreational drug use.  Denies alcohol use.    Her most recent hemoglobin A1c done on 01/12/2019 was 4.6.    She is not currently working due to [place] and her knee pain.  She was previously employed at a school as a Hotel manager.  She currently lives at home in an apartment alone on the third floor apartment building.  She does have an elevator in.  No steps inside the apartment.  She has several family members and friends in the area who are ready and able to help her out after surgery.    She has native teeth in her lower mandible so she will require dental clearance; she states she has an appointment with her dentist next month.    IMAGING: X-ray machine with limited capabilities at the time of this appointment; patient was able to obtain a bilateral knee AP standing view today.  Bilateral knee AP standing view taken today 1 view had shown severe medial compartment arthritis of the right knee which with space narrowing, sclerosis, osteophyte formation, joint subluxation.  Moderate to severe left knee osteoarthritis with joint space narrowing, sclerosis, osteophyte formation at the medial compartment.  Bilateral genu varus deformity.    Imaging was reviewed by myself and Dr. Adelene Amas during this visit.  PHYSICAL EXAM:  GENERAL: Alert and oriented, in no acute distress  MOOD: Appropriate.   MUSCULOSKELETAL: Evaluation of the bilateral knees reveals no gross bony abnormalities.  Overlying skin changes including erythema, edema, ecchymosis, effusion, laceration, abrasions.  Tender to palpation over the medial joint lines bilaterally, significantly more on the right knee than the left.  Tender to palpation along the right patellar borders.  ROM: 0-120.  No pain with hip rotation.  Stable to varus valgus stress.  Left lower extremity with varicose veins that are nontender to palpation.  She previously had varicose vein  surgery on the right lower extremity nearly 10 years ago.  Calves are soft and nontender to palpation bilaterally.  CSM intact and equal bilateral lower extremities.    ASSESSMENT/PLAN: 66 year old female presenting today for evaluation of chronic bilateral knee pain, worse on the right than on the left.  X-rays taken today had shown severe right knee arthritis with moderate to severe left knee arthritis.  Patient is interested in a right total knee replacement.  Discussed the risk and benefits of surgery including the preoperative and postoperative timelines.  Discussed the necessary clearances that she would need preoperatively.  Patient would need to stop smoking which she is very amenable to and feels she can easily accomplish.  Discussed that she would likely have problems with pain control postoperatively given her history for opioid dependence and her current use of Suboxone.  We will be in contact with her PCP in regards to this.     Patient was provided with a total joint packet today; understands the need for PCP, pulmonary, dental clearances.  She will see [place] back in around 6 weeks; she will work on getting these clearances at that time.  Our office will call the patient in order to help schedule a total joint class and get her set up for PT eval.    At her next appointment, patient should have full knee films of bilateral knees.    Total Joint Selection Criteria:  __ Dental Clearance--will need  __ Medical (PCP) clearance--msg sent to pcp; she will need pulm clearance  __ Total Joint Class--will help to schedule  __ PT evaluation  __ DME obtained  _x_ BMI < 40  __ Non-smoker/Smoking cessation--1 cigarette per week; pt will quit, highly motivated  _x_ HbA1c < 8.0  __ COVID NP swab  __ Nasal swab  __ If (+) Mupirocin Rx given  __ CHG wipes given  __ Attended PAT  __ Case management screen  _x_ Psychosocial issues discussed  _x_ No current substance abuse      We reviewed the likely diagnosis, the  prognosis, and various treatment options in detail. Risks and benefits of treatment plan discussed. The patient's questions have been answered, and the patient understands agrees with treatment plan.     Dr. Adelene Amas saw and examined the patient and formulated the assessment and plan. Please see his dictated note for additional information.    This note was prepared using voice recognition software. Please disregard any transcription errors.    Belinda Fisher, PA-C, 06/14/2021      Nursing Communication:  _x__ Mena Goes: Right and left knee  ___ Mena Goes in splint/cast  ___ XOA out of splint/cast  ___ Cast removal   ___ MRI/CT review  ___ Surgical booking (vitals)  ___ None

## 2021-06-17 ENCOUNTER — Other Ambulatory Visit: Payer: Self-pay

## 2021-06-17 ENCOUNTER — Telehealth (HOSPITAL_BASED_OUTPATIENT_CLINIC_OR_DEPARTMENT_OTHER): Payer: Self-pay

## 2021-06-17 NOTE — Telephone Encounter (Signed)
I called the patient to offer the following services.    I was not able to reach the patient; if the patient calls back, please assist with the needs below.    Please copy and paste the provider's request below:    [name], MD  [facility]   [phone]   [place] [[id number]]     Please assist this patient with the following:     Schedule in-person visit for preoperative evaluation for TKR, time frame: within 6 weeks, with [facility] or Nikki Dom, if appointment not available: Schedule further out with provider indicated     Unable to reach pt--LVM to call back so we can schedule appt

## 2021-06-18 ENCOUNTER — Ambulatory Visit: Payer: 59 | Attending: Family Medicine | Admitting: Family Medicine

## 2021-06-18 ENCOUNTER — Encounter (HOSPITAL_BASED_OUTPATIENT_CLINIC_OR_DEPARTMENT_OTHER): Payer: Self-pay | Admitting: Family Medicine

## 2021-06-18 ENCOUNTER — Ambulatory Visit: Payer: 59 | Attending: Psychosomatic Medicine | Admitting: Clinical

## 2021-06-18 DIAGNOSIS — F1120 Opioid dependence, uncomplicated: Secondary | ICD-10-CM | POA: Diagnosis present

## 2021-06-18 DIAGNOSIS — F3341 Major depressive disorder, recurrent, in partial remission: Secondary | ICD-10-CM | POA: Diagnosis not present

## 2021-06-18 MED ORDER — BUPRENORPHINE HCL-NALOXONE HCL 8-2 MG SL FILM
ORAL_FILM | SUBLINGUAL | 0 refills | Status: DC
Start: 2021-06-18 — End: 2021-07-02

## 2021-06-18 NOTE — Progress Notes (Signed)
Cheryl Dixon is a 66 year old female seen in follow up for opioid use disorder:    Buprenorphine dose: 20mg   Response, adequacy of dose: good  Relapses/close calls: none  Trigger: denies  Meetings: none currently    Social history/events:  Going for knee surgery soon, working on staying healthy, using stationary bike 3 times a day. Has a new great grand baby (3rd one)!    Present Medications:  naproxen (EC NAPROSYN) 500 MG EC tablet, Take 1 tablet by mouth in the morning and 1 tablet in the evening. Take with meals., Disp: 60 tablet, Rfl: 2  buprenorphine-naloxone (SUBOXONE) 8-2 MG sublingual film, Take 2.5 films under the tongue daily., Disp: 35 Film, Rfl: 0  cholecalciferol (VITAMIN D3) 2000 UNIT TABS tablet, Take 1 tablet by mouth in the morning., Disp: 90 tablet, Rfl: 3  alendronate (FOSAMAX) 70 MG tablet, Take 70mg  every 7 days with 8 oz of water on empty stomach.  Do not take anything else by mouth and stay upright (do not lie down) for 30 min until 1st food of day., Disp: 13 tablet, Rfl: 3  polyethylene glycol (HEALTHYLAX) 17 g packet, TAKE 17G BY MOUTH DAILY MIX IN 64 OUNCE OF CLEAR LIQUID AND TAKE as directed, Disp: 28 packet, Rfl: 2  estradiol (ESTRACE VAGINAL) 0.1 MG/GM vaginal cream, Place 5 g vaginally 3 (three) times a week M[employer] brand only., Disp: 170 g, Rfl: 1  INCRUSE ELLIPTA 62.5 MCG/INH inhaler, INHALE 1 PUFF BY MOUTH INTO LUNGS DAILY, Disp: 30 each, Rfl: 11  buPROPion (WELLBUTRIN SR) 200 MG 12 hr tablet, Take 1 tablet by mouth in the morning and 1 tablet before bedtime., Disp: 180 tablet, Rfl: 3  albuterol HFA 108 (90 Base) MCG/ACT inhaler, INHALE 2 PUFFS INTO THE LUNGS EVERY 6 HOURS AS NEEDED FOR WHEEZING OR SHORTNESS OF BREATH, Disp: 8.5 g, Rfl: 11  polyethylene glycol (GOLYTELY) 236 g suspension, Take 4,000 mLs by mouth See Admin Instructions Take as directed prior to your colonoscopy, Disp: 4000 mL, Rfl: 0  Simethicone 80 MG TABS, Take 4 tablets by mouth See Admin Instructions Take as directed  prior to colonoscopy., Disp: 4 tablet, Rfl: 0  naproxen (NAPROSYN) 500 MG tablet, TAKE 1 TABLET BY MOUTH WITH FOOD EVERY TWELVE HOURS AS NEEDED FOR PAIN, Disp: 20 tablet, Rfl: 0  acetaminophen (TYLENOL) 500 MG tablet, Take 500 mg by mouth every 6 (six) hours as needed for Pain, Disp: , Rfl:   ipratropium-albuterol (DUO-NEB) 0.5-2.5 (3) MG/3ML SOLN Inhalation Solution, Take 3 mLs by nebulization 4 (four) times daily., Disp: 180 mL, Rfl: 0    No current facility-administered medications on file prior to visit.      PHQ-9 TOTAL SCORE 01/12/2019 06/01/2018 03/03/2017   Doc FlowSheet Total Score - - -   Doc FlowSheet Total Score 10 6 4            PHYSICAL EXAMINATION via video:   General appearance - healthy female in no distress  Neuro - nonfocal  Affect - normal  Behavior without evidence of intoxication or impairment    PMP Reviewed.  No unauthorized prescriptions since last refill.     Assessment:  Opioid Dependence  Comment: Focusing on taking care of her health and grateful for family.  Plan:  Continue buprenorphine, reviewed criteria for tapering when patient is ready. Discussed sources of support.  Patient is counseled regarding relapse prevention, involvement in recovery groups, and potential side effects of buprenorphine.    For the patient participatingin the group  via telephone and/or video technology: the patient/guardian has verbally consented toparticipate in this group therapy sessionby televisit. The patient/guardian wasencouraged to be in a private location due to personal health information being discussedand where they could not be overheard by individuals not participating in the group therapy. Although all [facility] staff/providers participating in this group therapy televisit are in a private location, all the risks of telephone and/or video technology used to conduct this group therapy session cannot be controlled by [place], (i.e. interruptions, unauthorized access/recording and technical difficulties).  Each patient understands that the visit can be stopped at any time for any reason, including if the conferencing connections are inadequate.     Julieta Gutting, MD, 06/18/2021

## 2021-06-18 NOTE — Addendum Note (Signed)
Addended by: Floyde Parkins on: 06/18/2021 02:59 PM     Modules accepted: Orders, SmartSet

## 2021-06-18 NOTE — Progress Notes (Signed)
I saw this patient in conjunction with Belinda Fisher PA-C.  I am in agreement with the evaluation, assessment and plan.  I personally interviewed this patient, performed a physical examination, discussed the findings and diagnosis with the patient and made recommendations relative to treatment and plan.  In addition, I have once again reviewed applicable imaging studies independently.    In brief, this patient returns regarding her bilateral knee pain.  She has a known history of degenerative arthritis of the knees.  This now more significantly impacts activity of daily living and has been refractory to conservative management.  On exam today she is somewhat tender along the medial aspect of the right knee and along the patella.  She has good range of motion.  No instability.  Leg is well-perfused.    It is also notable that she has a history of bilateral lower extremity varicose veins status post ligation and stripping on the right from which she did well.  No known history of DVT.    Standing x-rays of the knees were done today.  These films show marked cartilage space narrowing of the medial compartment with subchondral sclerosis and osteophyte formation.    The patient has advanced degenerative arthritis of the right knee.  She wishes to move forward with total knee arthroplasty, right.  The procedure was discussed in general terms.  She will move along with the preoperative readiness and clearance process.  She does have prediabetes.  Recent random glucose was normal.  She smokes rarely.  She understands she must be cigarette smoke free.  Follow-up in 6 weeks.    Orson Ape. Adelene Amas, MD   June 18, 2021

## 2021-06-19 NOTE — Progress Notes (Addendum)
OBOT (OFFICE BASED OPIOID TREATMENT)  GROUP PROGRESS NOTE     Televisit consent, as applicable:  For the patient participating in the group via telephone and/or video technology: The patient/guardian has verbally consented to participate in this group therapy session by televisit. The patient/guardian was encouraged to be in a private location due to personal health information being discussed and where they could not be overheard by individuals not participating in the group therapy. Although all [facility] staff/providers participating in this group therapy televisit are in a private location, all the risks of telephone and/or video technology used to conduct this group therapy session cannot be controlled by [facility], (i.e. interruptions, unauthorized access/recording and technical difficulties).  Each patient understands that the visit can be stopped at any time for any reason, including if the conferencing connections are inadequate.     GROUP NAME:  Office Based Opioid Treatment Group     LEADER(S): Julieta Gutting, MD, Cherlynn Perches, Ph.D, [name], RN      Cheryl Dixon is a 66 year old, Divorced, female, who presented for [facility] group visit.   Length of group: 60 min which included 30 min of Health Behavior Intervention and OUD Psychoeducation.     Number of participants in group today: 10  Group Topic Today: Values and Committed Action.  Group members discussed the principle and practice of Committed Action as a step-by-step process of creating a life of integrity, true to one's deepest wishes and values. Group members discussed example of Committed Action as engaging in large patterns of effective action that are driven and guided by core values. Group members reviewed Committed Action as a tool needed to achieve psychological flexibility and take effective steps to successful recovery. Group members discussed strategies to reduce relapse risks and increase healthy coping mechanisms to support recovery and healthy  change.    Purpose of Group:    Education about Opioid Use Disorder (OUD) and treatment   Enhancing adjustment and coping with OUD   Improving management of OUD and treatment adherence   Supporting Recovery Process   Increasing health promoting behaviors   Decreasing health risk behaviors and risks of relapse   Addressing psychological/psychosocial/behavioral barriers to preventing addiction and relapse   Promotion of functional improvement     Group Interventions Utilized:   Psychoeducation related to the psychological, behavioral, and/ or psychosocial aspects of OUD   Motivational interviewing   Coping skills training   Cognitive restructuring   Emotional awareness and management   Operant behavior therapy and contingency management   Stimulus control   Mindfulness techniques training    Group Process:      Group rules    Review of group goals/structure.    Group members discussed the principle and practice of Committed Action as a  step-by-step process of creating a life of integrity, true to one's deepest wishes and values.    Group members discussed example of Committed Action as engaging in large patterns of effective action that are driven and guided by core values.    Group members reviewed Committed Action as a tool needed to achieve psychological flexibility and take effective steps to successful recovery.    Group members discussed strategies to reduce relapse risks and increase healthy coping mechanisms to support recovery and healthy change.     Group members explored and shared additional relapse prevention strategies.    Mindfulness and self-compassion strategies discussed.   Check in  Individual Patient Participation:   Level of participation in the group: Pt presented as engaged and participated in the group discussion, pt appeared attentive to the group process.   Subjective report of pt's progress: Pt reported doing well and focusing on her health: "I am trying  to stay healthy".   Pt will have a knee surgery soon, reports using stationary bike 3 times a day.  Social History update: Pt's 3rd great grandson turned 3 months  Participation in meetings outside of this group: Not currently  Report on relapses and close calls: Pt reported no relapses/close calls.   Affect seems Euthymic.   Relevant changes in mental status: No major changes reported or observed    Diagnosis (addressed by this group):     Opioid dependence on agonist therapy ([place])  (primary encounter diagnosis)  MDD (major depressive disorder), recurrent, in partial remission [facility])    Medical Necessity of Session (how treatment is necessary to improve symptoms, functioning, or prevent worsening): treatment in necessary to enhance compliance with agonist treatment and maintain abstinence from opioids. Suboxone group participation is mandatory to continue Suboxone prescription and is necessary in order to enhance coping with Opioid Use Disorder.            Plan: Pt will continue OBOT treatment     Cherlynn Perches, Ph.D.

## 2021-06-26 ENCOUNTER — Other Ambulatory Visit (HOSPITAL_BASED_OUTPATIENT_CLINIC_OR_DEPARTMENT_OTHER): Payer: Self-pay | Admitting: Registered Nurse

## 2021-06-26 ENCOUNTER — Telehealth: Payer: Self-pay

## 2021-06-26 DIAGNOSIS — F1121 Opioid dependence, in remission: Secondary | ICD-10-CM

## 2021-06-26 NOTE — Telephone Encounter (Signed)
Call from pt this am.  ?Pt has a referral from MD and attempted to send via email to the [facility] email box but did not arrive on 06/13/21 ?Will forward over a text with picture (no access to scan) ?Able to do class at [facility] M/W 230p-345pm starting on 07/22/21 ?Will do intake closer to start of class.  ? ?

## 2021-06-28 ENCOUNTER — Ambulatory Visit: Payer: 59 | Attending: Internal Medicine | Admitting: Internal Medicine

## 2021-06-28 ENCOUNTER — Encounter (HOSPITAL_BASED_OUTPATIENT_CLINIC_OR_DEPARTMENT_OTHER): Payer: Self-pay | Admitting: Internal Medicine

## 2021-06-28 DIAGNOSIS — F17200 Nicotine dependence, unspecified, uncomplicated: Secondary | ICD-10-CM | POA: Diagnosis present

## 2021-06-28 DIAGNOSIS — M810 Age-related osteoporosis without current pathological fracture: Secondary | ICD-10-CM | POA: Insufficient documentation

## 2021-06-28 DIAGNOSIS — I7121 Aneurysm of the ascending aorta, without rupture: Secondary | ICD-10-CM | POA: Insufficient documentation

## 2021-06-28 DIAGNOSIS — J449 Chronic obstructive pulmonary disease, unspecified: Secondary | ICD-10-CM | POA: Insufficient documentation

## 2021-06-28 DIAGNOSIS — F1120 Opioid dependence, uncomplicated: Secondary | ICD-10-CM | POA: Diagnosis present

## 2021-06-28 MED ORDER — ALBUTEROL SULFATE HFA 108 (90 BASE) MCG/ACT IN AERS
INHALATION_SPRAY | RESPIRATORY_TRACT | 11 refills | Status: DC
Start: 2021-06-28 — End: 2023-04-01

## 2021-06-28 NOTE — Assessment & Plan Note (Signed)
Quit March 2023 in preparation for knee surgery  - congratulated on success so far.  Discussed relapse prevention strategies

## 2021-06-28 NOTE — Assessment & Plan Note (Signed)
Stable on current inhalers.  Never had PFTs, but clinically has not had a lot of exacerbations or severe symptoms.  - continue Incruse and albuterol PRN

## 2021-06-28 NOTE — Assessment & Plan Note (Signed)
Stable on buprenorphine  - continue buprenorphine

## 2021-06-28 NOTE — Assessment & Plan Note (Signed)
Not tolerating vitamin D 2000 units  - try 1/2 tab  - if still causing nausea there are multiple other formulations available

## 2021-06-28 NOTE — Progress Notes (Signed)
Subjective     Cheryl Dixon is a 66 year old female with COPD, OUD, osteoporosis, depression, OA of knees, seen for follow up:    OA knee  Planning to do knee replacement in November.    COPD  Using Incruse once a day.  No smoking she had last appointment with ortho.  Not using albuterol because she didn't have one.    Osteoporosis  Had a lot of nausea with vitamin D 2000 units    Pelvic pain  Had some crampy pain  Drank cranberry juice and it went away.    Neck snaps constantly  Can't turn head all the way to the right, or very far up and down.  Mostly in the posterior neck.  Also pain in shoulders, and they feel like it is going to pop out.  Doing 20 minutes of exercise twice a day.    TAA  Last seen by cardiac surgery at March 2022.  She was supposed to have follow up in September 2022 but it wasn't done.    Social History    Tobacco Use      Smoking status: Some Days        Packs/day: 1.00        Years: 20.00        Pack years: 20        Types: Cigarettes      Smokeless tobacco: Never      Tobacco comments: quit 1980-1996, then light until 2003, then 1 ppd since    Alcohol use: No    Drug use: Yes      Types: Marijuana      Comment: past opioids, nasal heroin, 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  MJ 1x per week    Patient Active Problem List:     Opioid dependence on agonist therapy (HCC)     Tobacco use disorder     Fibrocystic breast     Elevated BP without diagnosis of hypertension     Knee pain     Chronic low back pain     OA (osteoarthritis) of knee     H. pylori infection     Major depressive disorder, recurrent episode, severe (HCC)     Occult blood in stools     Prediabetes     Epigastric pain     Chronic obstructive pulmonary disease (HCC)     Left foot pain     Adenomatous polyp of colon     Acquired deformity of toe, right     Dislocation of metatarsophalangeal joint of lesser toe, right, sequela     Postmenopausal atrophic vaginitis     Abnormal loss of weight     Thoracic aortic aneurysm without rupture     COVID-19  virus infection     BMI 32.0-32.9,adult     Dysuria     LLQ pain     Age-related osteoporosis without current pathological fracture    albuterol HFA 108 (90 Base) MCG/ACT inhaler, INHALE 2 PUFFS INTO THE LUNGS EVERY 6 HOURS AS NEEDED FOR WHEEZING OR SHORTNESS OF BREATH, Disp: 8.5 g, Rfl: 11  buprenorphine-naloxone (SUBOXONE) 8-2 MG sublingual film, Take 2.5 films under the tongue daily., Disp: 35 Film, Rfl: 0  naproxen (EC NAPROSYN) 500 MG EC tablet, Take 1 tablet by mouth in the morning and 1 tablet in the evening. Take with meals., Disp: 60 tablet, Rfl: 2  cholecalciferol (VITAMIN D3) 2000 UNIT TABS tablet, Take 1 tablet by mouth in the morning., Disp: 90  tablet, Rfl: 3  alendronate (FOSAMAX) 70 MG tablet, Take 70mg  every 7 days with 8 oz of water on empty stomach.  Do not take anything else by mouth and stay upright (do not lie down) for 30 min until 1st food of day., Disp: 13 tablet, Rfl: 3  polyethylene glycol (HEALTHYLAX) 17 g packet, TAKE 17G BY MOUTH DAILY MIX IN 64 OUNCE OF CLEAR LIQUID AND TAKE as directed, Disp: 28 packet, Rfl: 2  estradiol (ESTRACE VAGINAL) 0.1 MG/GM vaginal cream, Place 5 g vaginally 3 (three) times a week M[employer] brand only., Disp: 170 g, Rfl: 1  INCRUSE ELLIPTA 62.5 MCG/INH inhaler, INHALE 1 PUFF BY MOUTH INTO LUNGS DAILY, Disp: 30 each, Rfl: 11  buPROPion (WELLBUTRIN SR) 200 MG 12 hr tablet, Take 1 tablet by mouth in the morning and 1 tablet before bedtime., Disp: 180 tablet, Rfl: 3  polyethylene glycol (GOLYTELY) 236 g suspension, Take 4,000 mLs by mouth See Admin Instructions Take as directed prior to your colonoscopy, Disp: 4000 mL, Rfl: 0  Simethicone 80 MG TABS, Take 4 tablets by mouth See Admin Instructions Take as directed prior to colonoscopy., Disp: 4 tablet, Rfl: 0  naproxen (NAPROSYN) 500 MG tablet, TAKE 1 TABLET BY MOUTH WITH FOOD EVERY TWELVE HOURS AS NEEDED FOR PAIN, Disp: 20 tablet, Rfl: 0  acetaminophen (TYLENOL) 500 MG tablet, Take 500 mg by mouth every 6 (six) hours  as needed for Pain, Disp: , Rfl:   ipratropium-albuterol (DUO-NEB) 0.5-2.5 (3) MG/3ML SOLN Inhalation Solution, Take 3 mLs by nebulization 4 (four) times daily., Disp: 180 mL, Rfl: 0    No current facility-administered medications for this visit.    Review of Patient's Allergies indicates:   Darvon                     Meperidine hcl              Comment:Nausea/vomit   Paxil [paroxetine]      Rash   Zoloft [sertraline *    Rash            Objective     No physical exam done on this virtual visit during the [place] pandemic  Patient was unable to connect to video via [place] link  On telephone, patient is alert and in no distress  Speech is clear  Speaking in full sentences  Thought content is normal              Plan   Chronic obstructive pulmonary disease ([place])  Stable on current inhalers.  Never had PFTs, but clinically has not had a lot of exacerbations or severe symptoms.  - continue Incruse and albuterol PRN    Thoracic aortic aneurysm without rupture [facility])  Initially noted on CT[facility]PA at [facility] November 2021.  Thoracic surgery consult at [facility] March 2022 recommended follow up scan in 6 months and echo to assess valve, but these were never done.  Plan:  - echo to assess for aortic valve disease or bicuspid valve  - CTA chest  - referred to cardiology to advise regarding monitoring plan    Age-related osteoporosis without current pathological fracture  Not tolerating vitamin D 2000 units  - try 1/2 tab  - if still causing nausea there are multiple other formulations available    Opioid dependence on agonist therapy ([place])  Stable on buprenorphine  - continue buprenorphine    Tobacco use disorder  Quit March 2023 in preparation for knee surgery  - congratulated  on success so far.  Discussed relapse prevention strategies        We discussed the patients current medications. The patient expressed understanding and no barriers to adherence were identified.  1. The patient indicates understanding of these issues  and agrees with the plan.  Brief care plan is updated and reviewed with the patient.   2. The patient is given an After Visit Summary sheet that lists all medications with directions, allergies, orders placed during this encounter, and follow-up instructions.   3. I reviewed the patient's medical information and medical history   4. I reconciled the patient's medication list and prepared and supplied needed refills.   5. I have reviewed the past medical, family, and social history sections including the medications and allergies.    [name], MD

## 2021-06-28 NOTE — Progress Notes (Deleted)
Subjective     Cheryl Dixon is a 66 year old female with COPD, OUD, osteoporosis, depression, OA of knees, seen for follow up:    OA knee  Planning to do knee replacement in November.    COPD  Using Incruse once a day.  No smoking she had last appointment with ortho.  Not using albuterol because she didn't have one.    Osteoporosis  Had a lot of nausea with vitamin D 2000 units    Pelvic pain  Had some crampy pain  Drank cranberry juice and it went away.    Neck snaps constantly  Can't turn head all the way to the right, or very far up and down.  Mostly in the posterior neck.  Also pain in shoulders, and they feel like it is going to pop out.  Doing 20 minutes of exercise twice a day.    TAA  Last seen by cardiac surgery at March 2022.  She was supposed to have follow up in September 2022 but it wasn't done.    Social History    Tobacco Use      Smoking status: Some Days        Packs/day: 1.00        Years: 20.00        Pack years: 20        Types: Cigarettes      Smokeless tobacco: Never      Tobacco comments: quit 1980-1996, then light until 2003, then 1 ppd since    Alcohol use: No    Drug use: Yes      Types: Marijuana      Comment: past opioids, nasal heroin, 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  MJ 1x per week    Patient Active Problem List:     Opioid dependence on agonist therapy (HCC)     Tobacco use disorder     Fibrocystic breast     Elevated BP without diagnosis of hypertension     Knee pain     Chronic low back pain     OA (osteoarthritis) of knee     H. pylori infection     Major depressive disorder, recurrent episode, severe (HCC)     Occult blood in stools     Prediabetes     Epigastric pain     Chronic obstructive pulmonary disease (HCC)     Left foot pain     Adenomatous polyp of colon     Acquired deformity of toe, right     Dislocation of metatarsophalangeal joint of lesser toe, right, sequela     Postmenopausal atrophic vaginitis     Abnormal loss of weight     Thoracic aortic aneurysm without rupture     COVID-19  virus infection     BMI 32.0-32.9,adult     Dysuria     LLQ pain     Age-related osteoporosis without current pathological fracture    buprenorphine-naloxone (SUBOXONE) 8-2 MG sublingual film, Take 2.5 films under the tongue daily., Disp: 35 Film, Rfl: 0  naproxen (EC NAPROSYN) 500 MG EC tablet, Take 1 tablet by mouth in the morning and 1 tablet in the evening. Take with meals., Disp: 60 tablet, Rfl: 2  cholecalciferol (VITAMIN D3) 2000 UNIT TABS tablet, Take 1 tablet by mouth in the morning., Disp: 90 tablet, Rfl: 3  alendronate (FOSAMAX) 70 MG tablet, Take 70mg  every 7 days with 8 oz of water on empty stomach.  Do not take anything else by mouth  and stay upright (do not lie down) for 30 min until 1st food of day., Disp: 13 tablet, Rfl: 3  polyethylene glycol (HEALTHYLAX) 17 g packet, TAKE 17G BY MOUTH DAILY MIX IN 64 OUNCE OF CLEAR LIQUID AND TAKE as directed, Disp: 28 packet, Rfl: 2  estradiol (ESTRACE VAGINAL) 0.1 MG/GM vaginal cream, Place 5 g vaginally 3 (three) times a week M[employer] brand only., Disp: 170 g, Rfl: 1  INCRUSE ELLIPTA 62.5 MCG/INH inhaler, INHALE 1 PUFF BY MOUTH INTO LUNGS DAILY, Disp: 30 each, Rfl: 11  buPROPion (WELLBUTRIN SR) 200 MG 12 hr tablet, Take 1 tablet by mouth in the morning and 1 tablet before bedtime., Disp: 180 tablet, Rfl: 3  albuterol HFA 108 (90 Base) MCG/ACT inhaler, INHALE 2 PUFFS INTO THE LUNGS EVERY 6 HOURS AS NEEDED FOR WHEEZING OR SHORTNESS OF BREATH, Disp: 8.5 g, Rfl: 11  polyethylene glycol (GOLYTELY) 236 g suspension, Take 4,000 mLs by mouth See Admin Instructions Take as directed prior to your colonoscopy, Disp: 4000 mL, Rfl: 0  Simethicone 80 MG TABS, Take 4 tablets by mouth See Admin Instructions Take as directed prior to colonoscopy., Disp: 4 tablet, Rfl: 0  naproxen (NAPROSYN) 500 MG tablet, TAKE 1 TABLET BY MOUTH WITH FOOD EVERY TWELVE HOURS AS NEEDED FOR PAIN, Disp: 20 tablet, Rfl: 0  acetaminophen (TYLENOL) 500 MG tablet, Take 500 mg by mouth every 6 (six) hours  as needed for Pain, Disp: , Rfl:   ipratropium-albuterol (DUO-NEB) 0.5-2.5 (3) MG/3ML SOLN Inhalation Solution, Take 3 mLs by nebulization 4 (four) times daily., Disp: 180 mL, Rfl: 0    No current facility-administered medications for this visit.    Review of Patient's Allergies indicates:   Darvon                     Meperidine hcl              Comment:Nausea/vomit   Paxil [paroxetine]      Rash   Zoloft [sertraline *    Rash            Objective     LMP 11/20/1991               Plan   ***    We discussed the patients current medications. The patient expressed understanding and no barriers to adherence were identified.  1. The patient indicates understanding of these issues and agrees with the plan.  Brief care plan is updated and reviewed with the patient.   2. The patient is given an After Visit Summary sheet that lists all medications with directions, allergies, orders placed during this encounter, and follow-up instructions.   3. I reviewed the patient's medical information and medical history   4. I reconciled the patient's medication list and prepared and supplied needed refills.   5. I have reviewed the past medical, family, and social history sections including the medications and allergies.    [name], MD

## 2021-06-28 NOTE — Assessment & Plan Note (Signed)
Initially noted on CT-PA at [facility] November 2021.  Thoracic surgery consult at [facility] March 2022 recommended follow up scan in 6 months and echo to assess valve, but these were never done.  Plan:  - echo to assess for aortic valve disease or bicuspid valve  - CTA chest  - referred to cardiology to advise regarding monitoring plan

## 2021-07-02 ENCOUNTER — Other Ambulatory Visit (HOSPITAL_BASED_OUTPATIENT_CLINIC_OR_DEPARTMENT_OTHER): Payer: Self-pay | Admitting: Family Medicine

## 2021-07-02 ENCOUNTER — Ambulatory Visit (HOSPITAL_BASED_OUTPATIENT_CLINIC_OR_DEPARTMENT_OTHER): Payer: 59 | Admitting: Family Medicine

## 2021-07-02 ENCOUNTER — Encounter (HOSPITAL_BASED_OUTPATIENT_CLINIC_OR_DEPARTMENT_OTHER): Payer: Self-pay | Admitting: Clinical

## 2021-07-02 ENCOUNTER — Ambulatory Visit: Payer: 59 | Admitting: Clinical

## 2021-07-02 DIAGNOSIS — Z5321 Procedure and treatment not carried out due to patient leaving prior to being seen by health care provider: Secondary | ICD-10-CM

## 2021-07-02 DIAGNOSIS — F1120 Opioid dependence, uncomplicated: Secondary | ICD-10-CM

## 2021-07-02 MED ORDER — BUPRENORPHINE HCL-NALOXONE HCL 8-2 MG SL FILM
ORAL_FILM | SUBLINGUAL | 0 refills | Status: DC
Start: 2021-07-02 — End: 2021-07-16

## 2021-07-02 MED FILL — NICOTINE TD DIS 21MG/24H: 28 days supply | Qty: 28 | Fill #0

## 2021-07-02 NOTE — Telephone Encounter (Signed)
Unable to join group but she did check in with [place].

## 2021-07-03 ENCOUNTER — Ambulatory Visit: Payer: 59 | Attending: Orthopaedic Surgery | Admitting: Physician Assistant

## 2021-07-03 DIAGNOSIS — M1711 Unilateral primary osteoarthritis, right knee: Secondary | ICD-10-CM

## 2021-07-03 NOTE — Progress Notes (Signed)
TOTAL JOINT CLASS TELEVISIT    HPI: Patient presents for Total Joint Class  Followed by Dr. Adelene Amas for R knee OA and is in the process of completing selection criteria for R THA    Physical Exam   General Appearance: Alert and oriented x3, pleasant and cooperative.  Mood: Appropriate.  Breathing/Respiratory: Non-labored.    A/P: This is a 66 year old female with R knee OA, planning for R THA  Patient's questions were answered  Total joint class completed  Patient will call or e-mail with additional questions or schedule f/u visit prn  Continue with total joint selection criteria   F/u with Dr. Adelene Amas as scheduled.     I spent a total of 40 minutes on this visit on the date of service (total time includes all activities performed on the date of service)      April Manson, PA-C, PA-C, 07/03/2021

## 2021-07-06 NOTE — Progress Notes (Signed)
Unable to connect with patient  .

## 2021-07-10 ENCOUNTER — Encounter (HOSPITAL_BASED_OUTPATIENT_CLINIC_OR_DEPARTMENT_OTHER): Payer: Self-pay | Admitting: Physician Assistant

## 2021-07-10 ENCOUNTER — Ambulatory Visit: Payer: 59 | Attending: Student in an Organized Health Care Education/Training Program | Admitting: Physician Assistant

## 2021-07-10 ENCOUNTER — Other Ambulatory Visit: Payer: Self-pay

## 2021-07-10 VITALS — BP 137/86 | HR 79 | Wt 206.0 lb

## 2021-07-10 DIAGNOSIS — R1032 Left lower quadrant pain: Secondary | ICD-10-CM | POA: Diagnosis present

## 2021-07-10 DIAGNOSIS — R3911 Hesitancy of micturition: Secondary | ICD-10-CM | POA: Insufficient documentation

## 2021-07-10 NOTE — Progress Notes (Addendum)
Cheryl Dixon is a 66 yr old F who presents with pelvic pain    S:  Pelvic pain began 3 years ago that comes and goes. Has been taking suboxone and feels that this is masking her pain. Pain is always in left lower quadrant that radiates to back. Has been taking OTC ibuprofen/tyelonol/naproxen. Pain episodes are happening more frequently and worse when she walks. She exercises on bike and does stretches which help.    She has had difficulty with urination when she takes pain medications such as NSAIDs. The past week she has had worsening difficulty urinating at night, having to wait 5 min on toilet. These symptoms have been happening on and off for the past 2 years.    She has been using estrogen cream for 2 years a few times per week due to vaginal atrophy. Had a hysterectomy and bilateral BSO 30+ years ago due to endometriosis. Pap is UTD. Father had kidney removed (unsure of reason).    O:  BP 137/86 (Site: LA, Position: Sitting, Cuff Size: Lrg)    Pulse 79    Wt 93.4 kg (206 lb)    LMP 11/20/1991    BMI 38.92 kg/m     Physical Exam:  General - well-appearing, no acute distress  Pelvic - deferred, no indication for exam  Psych - alert and oriented, appropriate mood and affect    A/P: 66 yr old F who presents with chronic left lower quadrant pain and urinary hesitancy    #Left lower quadrant pain  - pain radiates to back and worsens with walking and standing, more consistent with MSK pain. This is supported by recent diagnosis of osteoporosis  - unlikely to be GYN pain given lack of reproductive organs (Hysterectomy and bilateral salpingo-ophorectomy). Sonogram not warranted at this time  - recommend f/u with PCP for pain control    #Urinary hesitancy  - patient reporting increased difficulty urinating, at times waiting up to 5 min to urinate or going to the bathroom without urinating. Other times she is able to pass urine well.  - She has been tested several times for UTIs, all negative  - recommend follow up with PCP  and possible urology referral    Alain Honey MS3

## 2021-07-12 ENCOUNTER — Telehealth: Payer: Self-pay

## 2021-07-12 NOTE — Telephone Encounter (Signed)
Call to pt reference next PREP class starting 07/22/21 at [facility] M/W 230p-345p ?Confirmed she can start then and time works ?Intake scheduled for 315pm on 07/15/21 ? ?

## 2021-07-15 NOTE — Progress Notes (Signed)
[facility] PREP Evaluation ? ?Patient Details  ?Name: Michelle Roberts ?MRN: [id number] ?Date of Birth: Apr 09, 1956 ?Age: 66 y.o. ?PCP: Michelle Corona, NP ? ?Vitals:  ? 07/15/21 1623  ?BP: 128/81  ?Pulse: 93  ? ? ? [facility] Eval - 07/15/21 1600   ? ?  ? [facility]PREP[facility] Location  ? [facility] "PREP" Location [place]   ?  ? Referral   ? Referring Provider Michelle Roberts   ? Reason for referral Obesitity/Overweight;Hypertension;High Cholesterol;Other   ? Program Start Date 07/22/21   230p-345p x 12 wks  ?  ? Measurement  ? Waist Circumference 41.5 inches   ? Hip Circumference 49 inches   ? Body fat --   instrument not available  ?  ? Information for Trainer  ? Goals Be able to move, lose wt, pain mgmt   ? Current Exercise housework, walking   ? Orthopedic Concerns Lower back DDD, Spinal stenosis, back spasms   ? Pertinent Medical History OSA no CPAP, thyroid cancer, OA, RA in remission HTN, high chol, DM   ? Current Barriers none   ? Restrictions/Precautions Diabetic snack before exercise;Assistive device;Fall risk   ? Medications that affect exercise Medication causing dizziness/drowsiness;Asthma inhaler   ?  ? Timed Up and Go (TUGS)  ? Timed Up and Go Moderate risk 10-12 seconds   ?  ? Mobility and Daily Activities  ? I find it easy to walk up or down two or more flights of stairs. 1   ? I have no trouble taking out the trash. 4   ? I do housework such as vacuuming and dusting on my own without difficulty. 2   ? I can easily lift a gallon of milk (8lbs). 3   ? I can easily walk a mile. 2   ? I have no trouble reaching into high cupboards or reaching down to pick up something from the floor. 1   ? I do not have trouble doing out-door work such as Armed forces logistics/support/administrative officer, raking leaves, or gardening. 1   ?  ? Mobility and Daily Activities  ? I feel younger than my age. 1   ? I feel independent. 4   ? I feel energetic. 3   ? I live an active life.  2   ? I feel strong. 2   ? I feel healthy. 1   ? I feel active as other people my age. 1   ?  ? How  fit and strong are you.  ? Fit and Strong Total Score 28   ? ?  ?  ? ?  ? ?Past Medical History:  ?Diagnosis Date  ? Anemia   ? Anxiety   ? Arthritis   ? Asthma   ? Back pain   ? Cancer [facility])   ? Depression   ? Fibromyalgia   ? Gallbladder problem   ? GERD (gastroesophageal reflux disease)   ? Hyperlipidemia   ? Hypertension   ? Hypothyroidism   ? thyroidectomy  ? Joint pain   ? Morton neuroma, right   ? per patient   ? Neoplasm of parotid gland   ? Neuromuscular disorder ([place])   ? Osteoarthritis   ? Palpitation   ? Prediabetes   ? Rheumatoid aortitis   ? Sleep apnea   ? uses CPAP sometimes  ? Swelling of lower extremity   ? Vitamin D deficiency   ? ?Past Surgical History:  ?Procedure Laterality Date  ? ABDOMINAL HYSTERECTOMY    ?  CHOLECYSTECTOMY    ? HAMMER TOE SURGERY    ? MASS EXCISION N/A 11/17/2017  ? Procedure: EXCISION SOFT PALATE LESION;  Surgeon: Michelle Nunnery, MD;  Location: [place];  Service: ENT;  Laterality: N/A;  ? PAROTIDECTOMY Left 04/08/2016  ? Procedure: LEFT SUPERFICIAL PAROTIDECTOMY WITH FACIAL NERVE DISECTION;  Surgeon: Michelle Nunnery, MD;  Location: [place];  Service: ENT;  Laterality: Left;  ? TARSAL METATARSAL ARTHRODESIS Right 02/03/2018  ? Procedure: RIGHT GREAT TOE  FUSION;  Surgeon: Michelle Koyanagi, MD;  Location: [place];  Service: Orthopedics;  Laterality: Right;  ? THYROIDECTOMY    ? ?Social History  ? ?Tobacco Use  ?Smoking Status Never  ?Smokeless Tobacco Never  ? ? ?Michelle Roberts ?07/15/2021, 4:28 PM ? ? ?

## 2021-07-16 ENCOUNTER — Ambulatory Visit: Payer: 59 | Attending: Family Medicine | Admitting: Family Medicine

## 2021-07-16 ENCOUNTER — Ambulatory Visit: Payer: 59 | Attending: Psychosomatic Medicine | Admitting: Clinical

## 2021-07-16 ENCOUNTER — Telehealth (HOSPITAL_BASED_OUTPATIENT_CLINIC_OR_DEPARTMENT_OTHER): Payer: Self-pay | Admitting: Internal Medicine

## 2021-07-16 DIAGNOSIS — F1120 Opioid dependence, uncomplicated: Secondary | ICD-10-CM | POA: Insufficient documentation

## 2021-07-16 DIAGNOSIS — F3341 Major depressive disorder, recurrent, in partial remission: Secondary | ICD-10-CM

## 2021-07-16 MED ORDER — BUPRENORPHINE HCL-NALOXONE HCL 8-2 MG SL FILM
ORAL_FILM | SUBLINGUAL | 0 refills | Status: DC
Start: 2021-07-16 — End: 2021-07-30

## 2021-07-16 NOTE — Telephone Encounter (Addendum)
Called and spoke with patient.Reports she is still having problems with her ongoing health issues,seeing OBGYN did not resolve her issues.Reports she is still having lots of back pain,problem with urination especially on the day she is not using estradiol.Advised this message will be relayed to her provider.Offered in person visit,declined and requested for Tele visit.Televisit was scheduled as per request.    ----- Message from Helaine Chess sent at 07/16/2021  1:58 PM EDT -----  Regarding: referral  Contact: [place] [phone], 66 year old, female    Calls today:  Clinical Questions ([place])    Name of person calling [place]    Specific nature of request would like call back to discuss referral     Return phone number [phone]  Person calling on behalf of patient: Patient (self)        Patient's language of care: English    Patient does not need an interpreter.    Patient's PCP: [name], MD

## 2021-07-16 NOTE — Progress Notes (Signed)
Cheryl Dixon is a 66 year old female seen in follow up for opioid use disorder:    Buprenorphine dose: '20mg'$   Response, adequacy of dose: good  Relapses/close calls: none  Trigger: denies  Meetings: none currently    Social history/events:  Worried about knee surgery soon, otherwise stable, doing what she can  Suboxone she continues to find helpful  Notes in the past methadone felt like she was always chasing ; more normal on suboxone    Present Medications:  buprenorphine-naloxone (SUBOXONE) 8-2 MG sublingual film, Take 2.5 films under the tongue daily., Disp: 35 Film, Rfl: 0  albuterol HFA 108 (90 Base) MCG/ACT inhaler, INHALE 2 PUFFS INTO THE LUNGS EVERY 6 HOURS AS NEEDED FOR WHEEZING OR SHORTNESS OF BREATH, Disp: 8.5 g, Rfl: 11  naproxen (EC NAPROSYN) 500 MG EC tablet, Take 1 tablet by mouth in the morning and 1 tablet in the evening. Take with meals., Disp: 60 tablet, Rfl: 2  cholecalciferol (VITAMIN D3) 2000 UNIT TABS tablet, Take 1 tablet by mouth in the morning., Disp: 90 tablet, Rfl: 3  alendronate (FOSAMAX) 70 MG tablet, Take '70mg'$  every 7 days with 8 oz of water on empty stomach.  Do not take anything else by mouth and stay upright (do not lie down) for 30 min until 1st food of day., Disp: 13 tablet, Rfl: 3  polyethylene glycol (HEALTHYLAX) 17 g packet, TAKE 17G BY MOUTH DAILY MIX IN 64 OUNCE OF CLEAR LIQUID AND TAKE as directed, Disp: 28 packet, Rfl: 2  estradiol (ESTRACE VAGINAL) 0.1 MG/GM vaginal cream, Place 5 g vaginally 3 (three) times a week [employer] brand only., Disp: 170 g, Rfl: 1  INCRUSE ELLIPTA 62.5 MCG/INH inhaler, INHALE 1 PUFF BY MOUTH INTO LUNGS DAILY, Disp: 30 each, Rfl: 11  buPROPion (WELLBUTRIN SR) 200 MG 12 hr tablet, Take 1 tablet by mouth in the morning and 1 tablet before bedtime., Disp: 180 tablet, Rfl: 3  polyethylene glycol (GOLYTELY) 236 g suspension, Take 4,000 mLs by mouth See Admin Instructions Take as directed prior to your colonoscopy, Disp: 4000 mL, Rfl: 0  Simethicone 80 MG  TABS, Take 4 tablets by mouth See Admin Instructions Take as directed prior to colonoscopy., Disp: 4 tablet, Rfl: 0  naproxen (NAPROSYN) 500 MG tablet, TAKE 1 TABLET BY MOUTH WITH FOOD EVERY TWELVE HOURS AS NEEDED FOR PAIN, Disp: 20 tablet, Rfl: 0  acetaminophen (TYLENOL) 500 MG tablet, Take 500 mg by mouth every 6 (six) hours as needed for Pain, Disp: , Rfl:   ipratropium-albuterol (DUO-NEB) 0.5-2.5 (3) MG/3ML SOLN Inhalation Solution, Take 3 mLs by nebulization 4 (four) times daily., Disp: 180 mL, Rfl: 0    No current facility-administered medications on file prior to visit.      PHQ-9 TOTAL SCORE 01/12/2019 06/01/2018 03/03/2017   Doc FlowSheet Total Score - - -   Doc FlowSheet Total Score '10 6 4           '$ PHYSICAL EXAMINATION via video:   General appearance - healthy female in no distress  Neuro - nonfocal  Affect - normal  Behavior without evidence of intoxication or impairment    PMP Reviewed.  No unauthorized prescriptions since last refill.     Assessment:  Opioid Dependence  Comment: Stable in recovery.  Plan:  Continue buprenorphine, reviewed criteria for tapering when patient is ready. Discussed sources of support.  Patient is counseled regarding relapse prevention, involvement in recovery groups, and potential side effects of buprenorphine.    For the patient  participatingin the group via telephone and/or video technology: the patient/guardian has verbally consented toparticipate in this group therapy sessionby televisit. The patient/guardian wasencouraged to be in a private location due to personal health information being discussedand where they could not be overheard by individuals not participating in the group therapy. Although all [facility] staff/providers participating in this group therapy televisit are in a private location, all the risks of telephone and/or video technology used to conduct this group therapy session cannot be controlled by [place], (i.e. interruptions, unauthorized access/recording and  technical difficulties). Each patient understands that the visit can be stopped at any time for any reason, including if the conferencing connections are inadequate.     Julieta Gutting, MD, 07/16/2021

## 2021-07-17 ENCOUNTER — Other Ambulatory Visit: Payer: Self-pay

## 2021-07-17 ENCOUNTER — Other Ambulatory Visit (HOSPITAL_BASED_OUTPATIENT_CLINIC_OR_DEPARTMENT_OTHER): Payer: Self-pay | Admitting: Internal Medicine

## 2021-07-17 ENCOUNTER — Ambulatory Visit: Payer: 59 | Attending: Family Medicine

## 2021-07-17 DIAGNOSIS — N952 Postmenopausal atrophic vaginitis: Secondary | ICD-10-CM

## 2021-07-17 DIAGNOSIS — F1121 Opioid dependence, in remission: Secondary | ICD-10-CM

## 2021-07-17 LAB — SPECIMEN VALIDITY URINE
SPEC VALIDITY SPECIFIC GRAVITY: 1.01 (ref 1.003–1.035)
SPECIMEN VALIDITY URINE CREAT: 121 mg/dL (ref >20)
SPECIMEN VALIDITY URINE PH: 5.8 (ref 5.0–8.5)

## 2021-07-17 LAB — BUPRENORPHINE SCREEN URINE: BUPRENORPHINE SCREEN URINE: POSITIVE ng/mL — AB

## 2021-07-17 LAB — OXYCODONE SCREEN URINE: OXYCOD SCRN URINE: NEGATIVE ng/mL

## 2021-07-17 LAB — CANNABINOIDS URINE: CANNABINOIDS URINE: POSITIVE ng/mL — AB

## 2021-07-17 LAB — BENZODIAZEPINES URINE: BENZODIAZEPINES URINE: NEGATIVE ng/mL

## 2021-07-17 LAB — OPIATES URINE: OPIATES URINE: NEGATIVE ng/mL

## 2021-07-17 LAB — FENTANYL URINE: FENTANYL URINE: NEGATIVE ng/mL

## 2021-07-17 LAB — AMPHETAMINES URINE: AMPHETAMINES URINE: NEGATIVE ng/mL

## 2021-07-17 LAB — COCAINE METABOLITES URINE: COCAINE METABOLITES URINE: NEGATIVE ng/mL

## 2021-07-17 LAB — METHADONE URINE: METHADONE URINE: NEGATIVE ng/mL

## 2021-07-17 NOTE — Progress Notes (Signed)
Urine collected  Rob Hickman, 07/17/2021

## 2021-07-17 NOTE — Telephone Encounter (Signed)
PER Pharmacy, Cheryl Dixon is a 66 year old female has requested a refill of      - Estradiol (ESTRACE VAGINAL) 0.1 MG/GM vaginal cream        Last Office Visit: 07/16/21 with Deidre Ala  Last Physical Exam: 05/27/13     COLONOSCOPY due on 06/01/2021     Other Med Adult:  Most Recent BP Reading(s)  07/10/21 : 137/86        Cholesterol (mg/dL)   Date Value   10/22/2020 233     LOW DENSITY LIPOPROTEIN DIRECT (mg/dL)   Date Value   10/22/2020 158     HIGH DENSITY LIPOPROTEIN (mg/dL)   Date Value   10/22/2020 40     No results found for: TG      THYROID SCREEN TSH REFLEX FT4 (uIU/mL)   Date Value   01/12/2019 1.630         No results found for: TSH    HEMOGLOBIN A1C (%)   Date Value   01/12/2019 4.6       No results found for: POCA1C      INR (no units)   Date Value   02/16/2007 1.0 (L)   07/03/2006 < 1.0 (L)       SODIUM (mmol/L)   Date Value   10/22/2020 143       POTASSIUM (mmol/L)   Date Value   10/22/2020 4.5           CREATININE (mg/dL)   Date Value   10/22/2020 0.9        Documented patient preferred pharmacies:    [id number] [place] Phone: [phone]: [address] Phone: [phone]: [phone]

## 2021-07-19 MED FILL — ESTRADIOL CRE 0.01%: 80 days supply | Qty: 172 | Fill #0

## 2021-07-21 NOTE — Progress Notes (Signed)
[facility]OT (OFFICE BASED OPIOID TREATMENT)  GROUP PROGRESS NOTE     Televisit consent, as applicable:  For the patient participating in the group via telephone and/or video technology: The patient/guardian has verbally consented to participate in this group therapy session by televisit. The patient/guardian was encouraged to be in a private location due to personal health information being discussed and where they could not be overheard by individuals not participating in the group therapy. Although all [facility] staff/providers participating in this group therapy televisit are in a private location, all the risks of telephone and/or video technology used to conduct this group therapy session cannot be controlled by [place], (i.e. interruptions, unauthorized access/recording and technical difficulties).  Each patient understands that the visit can be stopped at any time for any reason, including if the conferencing connections are inadequate.     GROUP NAME:  Office Based Opioid Treatment Group     LEADER(S): Julieta Gutting, MD, Cherlynn Perches, Ph.D., and [name], RN      Cheryl Dixon is a 66 year old, Divorced, female, who presented for [facility] group visit.   Length of group: 60 min which included 30 min of Health Behavior Intervention and OUD Psychoeducation.    Number of participants in group today: 10  Group Topic Today: Using the DBT Pros and Cons  Skill for Recovery. Group members discussed the Pros and Cons skill,  a useful distress tolerance tool to deal with stressful situation. It is a distress tolerance skill used to deal with urges and impulsive behaviors. The DBT  Pros and Cons skill is one of the valuable tools to address issues patients may deal with when maintaining sobriety. Group members discussed strategies to reduce relapse risks and increase healthy coping mechanisms to support recovery and healthy change.          Purpose of Group:    Education about Opioid Use Disorder (OUD) and treatment   Enhancing  adjustment and coping with OUD   Improving management of OUD and treatment adherence   Supporting Recovery Process   Increasing health promoting behaviors   Decreasing health risk behaviors and risks of relapse   Addressing psychological/psychosocial/behavioral barriers to preventing addiction and relapse   Promotion of functional improvement     Group Interventions Utilized:   Psychoeducation related to the psychological, behavioral, and/ or psychosocial aspects of OUD   Motivational interviewing   Coping skills training   Cognitive restructuring   Emotional awareness and management   Operant behavior therapy and contingency management   Stimulus control   Mindfulness techniques training    Group Process:     Group rules   Review of group goals/structure.  Group members discussed the Pros and Cons skill.    Group members discussed examples of using the Pros and Cons skill to address issues they deal with when maintaining sobriety.   Group members discussed strategies to reduce relapse risks and increase healthy coping mechanisms to support recovery and healthy change.    Group members explored and shared additional relapse prevention strategies.   Mindfulness and self-compassion strategies discussed.  Check in        Individual Patient Participation:   Level of participation in the group: Pt presented as engaged and participated in the group discussion, pt appeared attentive to the group process.   Subjective report of pt's progress: Pt reported doing well and feeling stable. Pt expressed her concern and worries about the knee surgery she is having soon but she feels like she  is doing what she can. She finds Suboxone to be helpful; compared how she felt when she was on methadone in the past, and feels like Suboxone works better for her.  Social History update: no major updates reported  Participation in meetings outside of this group: Not currently  Report on relapses and close calls: reports stable  recovery;  denied relapses/close calls.   Affect seems Euthymic.   Relevant changes in mental status: No major changes reported or observed    Diagnosis (addressed by this group):     Opioid dependence on agonist therapy ([place])  (primary encounter diagnosis)  MDD (major depressive disorder), recurrent, in partial remission [facility])    Medical Necessity of Session (how treatment is necessary to improve symptoms, functioning, or prevent worsening): treatment in necessary to enhance compliance with agonist treatment and maintain abstinence from opioids. Suboxone group participation is mandatory to continue Suboxone prescription and is necessary in order to enhance coping with Opioid Use Disorder.            Plan: Pt will continue OBOT treatment     Cherlynn Perches, Ph.D.

## 2021-07-22 ENCOUNTER — Encounter (HOSPITAL_BASED_OUTPATIENT_CLINIC_OR_DEPARTMENT_OTHER): Payer: Self-pay | Admitting: Physician Assistant

## 2021-07-22 ENCOUNTER — Telehealth (HOSPITAL_BASED_OUTPATIENT_CLINIC_OR_DEPARTMENT_OTHER): Payer: Self-pay

## 2021-07-22 ENCOUNTER — Ambulatory Visit: Payer: 59 | Attending: Internal Medicine | Admitting: Physician Assistant

## 2021-07-22 ENCOUNTER — Telehealth: Payer: Self-pay | Admitting: Physical Medicine and Rehabilitation

## 2021-07-22 DIAGNOSIS — R1032 Left lower quadrant pain: Secondary | ICD-10-CM | POA: Diagnosis present

## 2021-07-22 NOTE — Telephone Encounter (Signed)
-----   Message from Adonis Housekeeper, PA-C sent at 07/22/2021  9:23 AM EDT -----  Regarding: Televisit follow up  Please assist this patient with the following:    Schedule in-person visit for LLQ pain, time frame: per pt preference, with PCP team, if appointment not available: Schedule further out with provider indicated

## 2021-07-22 NOTE — Progress Notes (Signed)
Cheryl Dixon is a 66 year old female pt of Dr. [name], MD who presents for LLQ pain      LLQ pain  For maybe 3 years   Starts LLQ then radiates to back   Gets episodes of pain  Happens maybe 1-3x monthly   Lasts 1-3 days  Eventually resolves  Sometimes sweats/chills  Feels she needs to strain with urination when having to strain  Uses ice, drinks cranberry juice   Sometimes gets if walking, other times can walk fine   Pyridium seems to help   Saw GYN, felt unlikely to be GYN pain as she has hx hysterectomy and BSO  Hx OA knee, planning for TKA  Denies fever, N/V/D/C, blood in stool, dysuria, hematuria, vaginal bleeding         Patient Active Problem List:     Opioid dependence on agonist therapy ([place])     Tobacco use disorder     Fibrocystic breast     Elevated BP without diagnosis of hypertension     Knee pain     Chronic low back pain     OA (osteoarthritis) of knee     H. pylori infection     Major depressive disorder, recurrent episode, severe (HCC)     Occult blood in stools     Prediabetes     Epigastric pain     Chronic obstructive pulmonary disease (HCC)     Left foot pain     Adenomatous polyp of colon     Acquired deformity of toe, right     Dislocation of metatarsophalangeal joint of lesser toe, right, sequela     Postmenopausal atrophic vaginitis     Abnormal loss of weight     Thoracic aortic aneurysm without rupture     COVID-19 virus infection     BMI 32.0-32.9,adult     Dysuria     LLQ pain     Age-related osteoporosis without current pathological fracture    estradiol (ESTRACE) 0.1 MG/GM vaginal cream, PLACE 5 G VAGINALLY 3 TIMES A WEEK [employer] BRAND ONLY., Disp: 170 g, Rfl: 0  buprenorphine-naloxone (SUBOXONE) 8-2 MG sublingual film, Take 2.5 films under the tongue daily., Disp: 35 Film, Rfl: 0  albuterol HFA 108 (90 Base) MCG/ACT inhaler, INHALE 2 PUFFS INTO THE LUNGS EVERY 6 HOURS AS NEEDED FOR WHEEZING OR SHORTNESS OF BREATH, Disp: 8.5 g, Rfl: 11  naproxen (EC NAPROSYN) 500 MG EC tablet, Take 1  tablet by mouth in the morning and 1 tablet in the evening. Take with meals., Disp: 60 tablet, Rfl: 2  cholecalciferol (VITAMIN D3) 2000 UNIT TABS tablet, Take 1 tablet by mouth in the morning., Disp: 90 tablet, Rfl: 3  alendronate (FOSAMAX) 70 MG tablet, Take '70mg'$  every 7 days with 8 oz of water on empty stomach.  Do not take anything else by mouth and stay upright (do not lie down) for 30 min until 1st food of day., Disp: 13 tablet, Rfl: 3  polyethylene glycol (HEALTHYLAX) 17 g packet, TAKE 17G BY MOUTH DAILY MIX IN 64 OUNCE OF CLEAR LIQUID AND TAKE as directed, Disp: 28 packet, Rfl: 2  INCRUSE ELLIPTA 62.5 MCG/INH inhaler, INHALE 1 PUFF BY MOUTH INTO LUNGS DAILY, Disp: 30 each, Rfl: 11  buPROPion (WELLBUTRIN SR) 200 MG 12 hr tablet, Take 1 tablet by mouth in the morning and 1 tablet before bedtime., Disp: 180 tablet, Rfl: 3  polyethylene glycol (GOLYTELY) 236 g suspension, Take 4,000 mLs by mouth See Admin  Instructions Take as directed prior to your colonoscopy, Disp: 4000 mL, Rfl: 0  Simethicone 80 MG TABS, Take 4 tablets by mouth See Admin Instructions Take as directed prior to colonoscopy., Disp: 4 tablet, Rfl: 0  naproxen (NAPROSYN) 500 MG tablet, TAKE 1 TABLET BY MOUTH WITH FOOD EVERY TWELVE HOURS AS NEEDED FOR PAIN, Disp: 20 tablet, Rfl: 0  acetaminophen (TYLENOL) 500 MG tablet, Take 500 mg by mouth every 6 (six) hours as needed for Pain, Disp: , Rfl:   ipratropium-albuterol (DUO-NEB) 0.5-2.5 (3) MG/3ML SOLN Inhalation Solution, Take 3 mLs by nebulization 4 (four) times daily., Disp: 180 mL, Rfl: 0    No current facility-administered medications on file prior to visit.    Review of Patient's Allergies indicates:   Darvon                     Meperidine hcl              Comment:Nausea/vomit   Paxil [paroxetine]      Rash   Zoloft [sertraline *    Rash        Exam   Pulm - speaking full sentences         ASSESSMENT/PLAN        (R10.32) LLQ pain  Comment: pt presents for evaluation of intermittent LLQ pain for  years. Unclear etiology of sxs, not suggestive of UTI given no dysuria. Renal stones possible, but would be somewhat unusual to have recurrent episodes like this for years. Possible this is MSK pain, as she does have hx OA knee, possible there is some lumbar spine OA contributing. Seen by GYN, felt unlikely GYN etiology  Plan: will schedule in person visit for physical exam to further assess, not seen in person in 9 months

## 2021-07-22 NOTE — Telephone Encounter (Signed)
Appointment already scheduled with provider for 07/30/21--patient declined sooner appt

## 2021-07-22 NOTE — Telephone Encounter (Signed)
Pt states injection did not help her. Wondering what else she can do?  ?

## 2021-07-25 ENCOUNTER — Ambulatory Visit (HOSPITAL_BASED_OUTPATIENT_CLINIC_OR_DEPARTMENT_OTHER): Payer: 59 | Admitting: Specialist

## 2021-07-26 ENCOUNTER — Encounter: Payer: Self-pay | Admitting: Physical Medicine and Rehabilitation

## 2021-07-26 ENCOUNTER — Ambulatory Visit: Payer: 59 | Admitting: Physical Medicine and Rehabilitation

## 2021-07-26 DIAGNOSIS — M545 Low back pain, unspecified: Secondary | ICD-10-CM | POA: Diagnosis not present

## 2021-07-26 DIAGNOSIS — M797 Fibromyalgia: Secondary | ICD-10-CM | POA: Diagnosis not present

## 2021-07-26 DIAGNOSIS — G8929 Other chronic pain: Secondary | ICD-10-CM

## 2021-07-26 DIAGNOSIS — M48061 Spinal stenosis, lumbar region without neurogenic claudication: Secondary | ICD-10-CM | POA: Diagnosis not present

## 2021-07-26 DIAGNOSIS — M47816 Spondylosis without myelopathy or radiculopathy, lumbar region: Secondary | ICD-10-CM | POA: Diagnosis not present

## 2021-07-26 NOTE — Progress Notes (Signed)
? ?Michelle Roberts - 66 y.o. female MRN [id number]  Date of birth: November 21, 1955 ? ?Office Visit Note: ?Visit Date: 07/26/2021 ?PCP: Michelle Corona, NP ?Referred by: Garald Balding, MD ? ?Subjective: ?Chief Complaint  ?Patient presents with  ? Lower Back - Pain, Follow-up  ? ?HPI: Michelle Roberts is a 66 y.o. female who comes in today For evaluation of chronic, worsening and severe bilateral lower back pain.  Patient reports pain has been going on for several months and is exacerbated by moving from a sitting to standing position, prolonged sitting and standing.  Patient describes the pain as a constant aching sensation and currently rates a 7 out of 10.  Patient reports some relief of pain with lidocaine patches, heating pad and ice packs.  Patient has attended formal physical therapy at [facility] where she did undergo dry needling, patient states short-lived pain relief with these treatments.  Patient states she does take Robaxin as needed for muscle spasms. Patients lumbar MRI from 2022 exhibits moderate bilateral facet arthropathy at L3-L4 and L4-L5, severe at L5-S1.  There is also moderate spinal canal stenosis noted at L3-L4.  Patient had right L4 transforaminal epidural steroid injection performed in our office on 04/02/2021 and reports greater than 75% relief of pain for over a month.  Patient states she feels that her right-sided radicular symptoms are now much more tolerable.  Patient states her bilateral lower back pain is the most severe discomfort at this time and feels that this pain is keeping her from being physically active and accomplishing daily tasks.  Patient states she has been attending exercise classes as part of a [place] weight management program at the [facility] however she is having difficulty performing these exercises due to severe pain.  Patient denies focal weakness, numbness and tingling. Patient denies recent trauma or falls. ? ? ?Incidentally, patient also mentioned  chronic issues with muscle spasms to her legs and and diffusely to thoracic back region.  Patient states she continues to take Robaxin as needed for muscle spasms but would like a recommendation for chiropractic treatment in this area.  Patient states that she does believe her fibromyalgia contributes to her pain and is working hard to stay active and get adequate sleep.  ? ?Review of Systems  ?Musculoskeletal:  Positive for back pain and myalgias.  ?Neurological:  Negative for tingling, sensory change, focal weakness and weakness.  ?All other systems reviewed and are negative. Otherwise per HPI. ? ?Assessment & Plan: ?Visit Diagnoses:  ?  ICD-10-CM   ?1. Chronic bilateral low back pain without sciatica  M54.50 Ambulatory referral to Physical Medicine Rehab  ? G89.29   ?  ?2. Spondylosis without myelopathy or radiculopathy, lumbar region  M47.816 Ambulatory referral to Physical Medicine Rehab  ?  ?3. Facet arthropathy, lumbar  M47.816 Ambulatory referral to Physical Medicine Rehab  ?  ?4. Spinal stenosis of lumbar region without neurogenic claudication  M48.061 Ambulatory referral to Physical Medicine Rehab  ?  ?5. Fibromyalgia  M79.7 Ambulatory referral to Physical Medicine Rehab  ?  ?   ?Plan: Findings:  ?1.  Chronic, worsening and severe bilateral axial back pain.  Patient continues to have severe pain despite good conservative therapy such as exercise regimen, use of medications and heat/ice.  Patient's clinical presentation and exam are consistent with facet mediated pain.  We also feel patients fibromyalgia could be working to exacerbate her pain. Patient does have severe pain noted with lumbar extension on exam today.  We believe the next step is to perform diagnostic and hopefully therapeutic bilateral L3-L4, L4-L5 and L5-S1 facet joint/medial branch blocks under fluoroscopic guidance.  If patient gets good pain relief with facet joint blocks we did discuss the possibility of longer sustained pain relief  with radiofrequency ablation.  I did provide patient with educational material regarding radiofrequency ablation procedure today for her to take home and review.  No red flag symptoms noted upon exam today. ? ?2.  Patient encouraged to remain active and to continue with exercise regimen as tolerated.  I did discuss ways to help reduce fibromyalgia exacerbations such as limiting stress and getting adequate rest. We were able to provide patient with recommendations for chiropractic treatment in the area.   ? ?Meds & Orders: No orders of the defined types were placed in this encounter. ?  ?Orders Placed This Encounter  ?Procedures  ? Ambulatory referral to Physical Medicine Rehab  ?  ?Follow-up: Return for Bilateral L3-L4, L4-L5 and L5-S1 facet joint/medial branch block.  ? ?Procedures: ?No procedures performed  ?   ? ?Clinical History: ?MRI LUMBAR SPINE WITHOUT CONTRAST ?  ?TECHNIQUE: ?Multiplanar, multisequence MR imaging of the lumbar spine was ?performed. No intravenous contrast was administered. ?  ?COMPARISON:  X-ray lumbar 02/12/2021. ?  ?FINDINGS: ?Segmentation:  Standard. ?  ?Alignment: 3 mm retrolisthesis of L1 on L2. Grade 1 anterolisthesis ?of L5 on S1 secondary to facet disease. ?  ?Vertebrae: No acute fracture, evidence of discitis, or aggressive ?bone lesion. ?  ?Conus medullaris and cauda equina: Conus extends to the L3-4 level. ?Conus and cauda equina appear normal. ?  ?Paraspinal and other soft tissues: No acute paraspinal abnormality. ?  ?Disc levels: ?  ?Disc spaces: Degenerative disease with disc height loss at L1-2. ?Disc desiccation at L3-4, L4-5 and L5-S1. ?  ?T12-L1: No significant disc bulge. No neural foraminal stenosis. No ?central canal stenosis. ?  ?L1-L2: Broad-based disc osteophyte complex with a prominent right ?lateral component. Mild right foraminal stenosis. No left foraminal ?stenosis. Mild bilateral facet arthropathy. Mild spinal stenosis. ?  ?L2-L3: No significant disc bulge. No  neural foraminal stenosis. No ?central canal stenosis. ?  ?L3-L4: Mild broad-based disc bulge. Moderate bilateral facet ?arthropathy with bilateral facet effusions. Moderate spinal ?stenosis. Severe left foraminal stenosis. Moderate right foraminal ?stenosis. ?  ?L4-L5: Broad-based disc bulge. Moderate bilateral facet arthropathy. ?Moderate spinal stenosis. Moderate bilateral foraminal stenosis. ?  ?L5-S1: Mild broad-based disc bulge. Severe bilateral facet ?arthropathy. Mild left foraminal stenosis. No right foraminal ?stenosis. No spinal stenosis ?  ?IMPRESSION: ?1. Lumbar spine spondylosis as described above. ?2. No acute osseous injury of the lumbar spine. ?  ?  ?Electronically Signed ?  By: Kathreen Devoid M.D. ?  On: 03/04/2021 11:41  ? ?She reports that she has never smoked. She has never used smokeless tobacco.  ?Recent Labs  ?  09/19/20 ?1125  ?HGBA1C 6.7*  ? ? ?Objective:  VS:  HT:    WT:   BMI:     BP:   HR: bpm  TEMP: ( )  RESP:  ?Physical Exam ?Vitals and nursing note reviewed.  ?HENT:  ?   Head: Normocephalic and atraumatic.  ?   Right Ear: External ear normal.  ?   Left Ear: External ear normal.  ?   Nose: Nose normal.  ?   Mouth/Throat:  ?   Mouth: Mucous membranes are moist.  ?Eyes:  ?   Extraocular Movements: Extraocular movements intact.  ?Cardiovascular:  ?  Rate and Rhythm: Normal rate.  ?   Pulses: Normal pulses.  ?Pulmonary:  ?   Effort: Pulmonary effort is normal.  ?Abdominal:  ?   General: Abdomen is flat. There is no distension.  ?Musculoskeletal:     ?   General: Tenderness present.  ?   Cervical back: Normal range of motion.  ?   Comments: Pt is slow to rise from seated position to standing. Concordant low back pain with facet loading, lumbar spine extension and rotation. Strong distal strength without clonus, no pain upon palpation of greater trochanters. Sensation intact bilaterally. Walks independently, gait steady.   ?Skin: ?   General: Skin is warm and dry.  ?   Capillary Refill:  Capillary refill takes less than 2 seconds.  ?Neurological:  ?   General: No focal deficit present.  ?   Mental Status: She is alert and oriented to person, place, and time.  ?Psychiatric:     ?   Mood and Affec

## 2021-07-26 NOTE — Progress Notes (Signed)
Had a Right L4 TF in December. Provided at least 75% relief. Pain has returned and she wants to figure out what to do withoiu having surgery. ?

## 2021-07-29 ENCOUNTER — Ambulatory Visit (HOSPITAL_BASED_OUTPATIENT_CLINIC_OR_DEPARTMENT_OTHER): Payer: Self-pay | Admitting: Registered Nurse

## 2021-07-29 ENCOUNTER — Encounter (HOSPITAL_BASED_OUTPATIENT_CLINIC_OR_DEPARTMENT_OTHER): Payer: Self-pay | Admitting: Registered Nurse

## 2021-07-29 ENCOUNTER — Telehealth: Payer: Self-pay

## 2021-07-29 MED FILL — NICOTINE TD DIS 21MG/24H: 28 days supply | Qty: 28 | Fill #0

## 2021-07-29 NOTE — Telephone Encounter (Signed)
Regarding: UTI symptoms  ----- Message from [facility] sent at 07/29/2021 12:19 PM EDT -----  Cheryl Dixon [id number], 66 year old, female    Calls today:  Sick    What are the symptoms: UTI symptoms-pt requests a call back     Person calling on behalf of patient:  patient    [place] NUMBER: [phone]        Patient's language of care: English       Patient's PCP: [name], MD

## 2021-07-29 NOTE — Telephone Encounter (Addendum)
Answer Assessment - Initial Assessment Questions  Pt reports LLQ intermittent abd pain  Very very low  Goes to back  Last time had this pain was 3 days before appt with a.goodwin  Using organic cranberry  Is doing much better  Pain comes on slowly  When has pain up to 8/9  Chronic pain  No uti's ever  Cranberry juice makes it better  Heating pad or ice  No constipation  Wants   Urine issue  Pt concerned she is masking pain w suboxone    Protocols used: ADULT ABDOMINAL PAIN - FEMALE-A-OH      Pt cancelled in person w A.Geraldo Pitter  Reports she would just like Dr. Diona Foley to maybe see her before surgery.

## 2021-07-30 ENCOUNTER — Telehealth (HOSPITAL_BASED_OUTPATIENT_CLINIC_OR_DEPARTMENT_OTHER): Payer: Self-pay

## 2021-07-30 ENCOUNTER — Ambulatory Visit: Payer: 59 | Attending: Family Medicine | Admitting: Family Medicine

## 2021-07-30 ENCOUNTER — Ambulatory Visit (HOSPITAL_BASED_OUTPATIENT_CLINIC_OR_DEPARTMENT_OTHER): Payer: 59 | Admitting: Physician Assistant

## 2021-07-30 ENCOUNTER — Ambulatory Visit: Payer: 59 | Attending: Psychosomatic Medicine | Admitting: Clinical

## 2021-07-30 DIAGNOSIS — F3341 Major depressive disorder, recurrent, in partial remission: Secondary | ICD-10-CM | POA: Diagnosis not present

## 2021-07-30 DIAGNOSIS — F1120 Opioid dependence, uncomplicated: Secondary | ICD-10-CM | POA: Diagnosis present

## 2021-07-30 MED ORDER — BUPRENORPHINE HCL-NALOXONE HCL 8-2 MG SL FILM
ORAL_FILM | SUBLINGUAL | 0 refills | Status: DC
Start: 2021-07-30 — End: 2021-08-13

## 2021-07-30 NOTE — Telephone Encounter (Signed)
[facility] pt requesting call back. Experiencing too much pain to continue PREP at this time ?Call placed to patient. Pt feels as though she overdid the fit testing-she performed very well on sit to stands, march in place and bicep curls-had significant pain post. Has seen ortho and chiro since. Ortho recommends ablation to LS spine-pt wants to see if chiro will help first.  ?She will withdrawal from PREP and will call me when she is able to do class in future ?

## 2021-07-30 NOTE — Telephone Encounter (Signed)
I called the patient to offer the following post-televisit services.    I was able to reach the patient and assisted them with the needs listed below.    Please copy and paste the provider's request below:    [name], MD  [place] 46 minutes ago (1:47 PM)     Cheryl Dixon   [id number]   [place]     Please assist this patient with the following:     Schedule in-person visit for preop for TKR, time frame: within 4 weeks, with [place], if appointment not available: OK to use 3-day or same-day

## 2021-07-30 NOTE — Progress Notes (Signed)
Cheryl Dixon is a 66 year old female seen in follow up for opioid use disorder:    Buprenorphine dose: '20mg'$   Response, adequacy of dose: good  Relapses/close calls: none  Trigger: denies  Meetings: none currently    Social history/events:  Doing well overall, has knee surgery scheduled for May, trying to keep up with doing small things like taking the stairs.     Present Medications:  estradiol (ESTRACE) 0.1 MG/GM vaginal cream, PLACE 5 G VAGINALLY 3 TIMES A WEEK MYLAN BRAND ONLY., Disp: 170 g, Rfl: 0  buprenorphine-naloxone (SUBOXONE) 8-2 MG sublingual film, Take 2.5 films under the tongue daily., Disp: 35 Film, Rfl: 0  albuterol HFA 108 (90 Base) MCG/ACT inhaler, INHALE 2 PUFFS INTO THE LUNGS EVERY 6 HOURS AS NEEDED FOR WHEEZING OR SHORTNESS OF BREATH, Disp: 8.5 g, Rfl: 11  naproxen (EC NAPROSYN) 500 MG EC tablet, Take 1 tablet by mouth in the morning and 1 tablet in the evening. Take with meals., Disp: 60 tablet, Rfl: 2  cholecalciferol (VITAMIN D3) 2000 UNIT TABS tablet, Take 1 tablet by mouth in the morning., Disp: 90 tablet, Rfl: 3  alendronate (FOSAMAX) 70 MG tablet, Take '70mg'$  every 7 days with 8 oz of water on empty stomach.  Do not take anything else by mouth and stay upright (do not lie down) for 30 min until 1st food of day., Disp: 13 tablet, Rfl: 3  INCRUSE ELLIPTA 62.5 MCG/INH inhaler, INHALE 1 PUFF BY MOUTH INTO LUNGS DAILY, Disp: 30 each, Rfl: 11  buPROPion (WELLBUTRIN SR) 200 MG 12 hr tablet, Take 1 tablet by mouth in the morning and 1 tablet before bedtime., Disp: 180 tablet, Rfl: 3  polyethylene glycol (GOLYTELY) 236 g suspension, Take 4,000 mLs by mouth See Admin Instructions Take as directed prior to your colonoscopy, Disp: 4000 mL, Rfl: 0  Simethicone 80 MG TABS, Take 4 tablets by mouth See Admin Instructions Take as directed prior to colonoscopy., Disp: 4 tablet, Rfl: 0  naproxen (NAPROSYN) 500 MG tablet, TAKE 1 TABLET BY MOUTH WITH FOOD EVERY TWELVE HOURS AS NEEDED FOR PAIN, Disp: 20 tablet,  Rfl: 0  acetaminophen (TYLENOL) 500 MG tablet, Take 500 mg by mouth every 6 (six) hours as needed for Pain, Disp: , Rfl:   ipratropium-albuterol (DUO-NEB) 0.5-2.5 (3) MG/3ML SOLN Inhalation Solution, Take 3 mLs by nebulization 4 (four) times daily., Disp: 180 mL, Rfl: 0    No current facility-administered medications on file prior to visit.      PHQ-9 TOTAL SCORE 01/12/2019 06/01/2018 03/03/2017   Doc FlowSheet Total Score - - -   Doc FlowSheet Total Score '10 6 4           '$ PHYSICAL EXAMINATION via video:   General appearance - healthy female in no distress  Neuro - nonfocal  Affect - normal  Behavior without evidence of intoxication or impairment    PMP Reviewed.  No unauthorized prescriptions since last refill.     Assessment:  Opioid Dependence  Comment: Stable in recovery.  Plan:  Continue buprenorphine, reviewed criteria for tapering when patient is ready. Discussed sources of support.  Patient is counseled regarding relapse prevention, involvement in recovery groups, and potential side effects of buprenorphine.    For the patient participating?in the group via telephone and/or video technology: the patient/guardian has verbally consented to?participate in this group therapy session?by televisit. The patient/guardian was?encouraged to be in a private location due to personal health information being discussed?and where they could not be overheard by  individuals not participating in the group therapy. Although all [facility] staff/providers participating in this group therapy televisit are in a private location, all the risks of telephone and/or video technology used to conduct this group therapy session cannot be controlled by [facility], (i.e. interruptions, unauthorized access/recording and technical difficulties).? Each patient understands that the visit can be stopped at any time for any reason, including if the conferencing connections are inadequate.     Julieta Gutting, MD, 07/30/2021

## 2021-08-06 NOTE — Progress Notes (Signed)
OBOT (OFFICE BASED OPIOID TREATMENT)  GROUP PROGRESS NOTE     Televisit consent, as applicable:  For the patient participating in the group via telephone and/or video technology: The patient/guardian has verbally consented to participate in this group therapy session by televisit. The patient/guardian was encouraged to be in a private location due to personal health information being discussed and where they could not be overheard by individuals not participating in the group therapy. Although all [place] staff/providers participating in this group therapy televisit are in a private location, all the risks of telephone and/or video technology used to conduct this group therapy session cannot be controlled by [facility], (i.e. interruptions, unauthorized access/recording and technical difficulties).  Each patient understands that the visit can be stopped at any time for any reason, including if the conferencing connections are inadequate.     GROUP NAME:  Office Based Opioid Treatment Group     LEADER(S): Julieta Gutting, MD, Cherlynn Perches, Ph.D., and [name], RN      Cheryl Dixon is a 66 year old, Divorced, female, who presented for [facility] group visit.   Length of group: 60 min which included 30 min of Health Behavior Intervention and OUD Psychoeducation.    Contact info for other agencies and [place] providers (if applicable): N/A    Problems addressed this session: OBOT, relapse prevention, stages of recovery, preventing high-risk situations.    Number of participants in group today: 10    Group Topic Today:  Exploring Pleasant Activities in Recovery. Group members learned that having fun and engaging in enjoyable activities is an important aspect of recovery from substance abuse. Group members discussed examples of engaging in leisure activities and how finding joy in life can improve overall well-being, reduce stress, and improve outcomes for recovery. Group members discussed strategies to reduce relapse risks and increase  healthy coping mechanisms to support recovery and healthy change.          Purpose of Group:   ? Education about Opioid Use Disorder (OUD) and treatment  ? Enhancing adjustment and coping with OUD  ? Improving management of OUD and treatment adherence  ? Supporting Recovery Process  ? Increasing health promoting behaviors  ? Decreasing health risk behaviors and risks of relapse  ? Addressing psychological/psychosocial/behavioral barriers to preventing addiction and relapse  ? Promotion of functional improvement     Group Interventions Utilized:  ? Psychoeducation related to the psychological, behavioral, and/ or psychosocial aspects of OUD  ? Motivational interviewing  ? Coping skills training  ? Cognitive restructuring  ? Emotional awareness and management  ? Operant behavior therapy and contingency management  ? Stimulus control  ? Mindfulness techniques training    Group Process:     Group rules   Review of group goals/structure.  roup members learned that having fun and engaging in enjoyable activities is an important aspect of recovery from substance abuse. Group members discussed examples of engaging in leisure activities and how finding joy in life can improve overall well-being, reduce stress, and improve outcomes for recovery.  Group members discussed strategies to reduce relapse risks and increase healthy coping mechanisms to support recovery and healthy change.    Group members explored and shared additional relapse prevention strategies.   Mindfulness and self-compassion strategies discussed.  Check in        Individual Patient Participation:   Level of participation in the group: Pt presented as engaged and participated in the group discussion, pt appeared attentive to the group process.  Subjective report of pt's progress: Pt reported feeling better and more satisfied when she completes her daily tasks. She continues focusing on her health and will have a surgery in May.     Social History update: no  major updates reported  Participation in meetings outside of this group: Not currently  Report on relapses and close calls: reports stable recovery;  denied relapses/close calls.   Affect seems Euthymic.   Relevant changes in mental status: No major changes reported or observed    Diagnosis (addressed by this group):     Opioid dependence on agonist therapy ([place])  (primary encounter diagnosis)  MDD (major depressive disorder), recurrent, in partial remission [facility])    Medical Necessity of Session (how treatment is necessary to improve symptoms, functioning, or prevent worsening): treatment in necessary to enhance compliance with agonist treatment and maintain abstinence from opioids. Suboxone group participation is mandatory to continue Suboxone prescription and is necessary in order to enhance coping with Opioid Use Disorder.            Plan: Pt will continue OBOT treatment     Cherlynn Perches, Ph.D.  .

## 2021-08-13 ENCOUNTER — Ambulatory Visit: Payer: 59 | Attending: Family Medicine | Admitting: Family Medicine

## 2021-08-13 DIAGNOSIS — F1120 Opioid dependence, uncomplicated: Secondary | ICD-10-CM | POA: Diagnosis present

## 2021-08-13 DIAGNOSIS — F17200 Nicotine dependence, unspecified, uncomplicated: Secondary | ICD-10-CM | POA: Insufficient documentation

## 2021-08-13 MED ORDER — NICOTINE 14 MG/24HR TD PT24
1.0000 | MEDICATED_PATCH | TRANSDERMAL | 1 refills | Status: AC
Start: 2021-08-13 — End: 2021-10-12

## 2021-08-13 MED ORDER — BUPRENORPHINE HCL-NALOXONE HCL 8-2 MG SL FILM
ORAL_FILM | SUBLINGUAL | 0 refills | Status: DC
Start: 2021-08-13 — End: 2021-08-27

## 2021-08-13 NOTE — Progress Notes (Signed)
Cheryl Dixon is a 66 year old female seen in follow up for opioid use disorder:    Buprenorphine dose: '20mg'$   Response, adequacy of dose: good  Relapses/close calls: none  Trigger: denies  Meetings: none currently    Social history/events:  Doing well overall, has knee surgery scheduled for May, trying to quit smoking (down to 1 cig/day) has to for surgery. Riding bike trying to get in good shape.     Present Medications:  buprenorphine-naloxone (SUBOXONE) 8-2 MG sublingual film, Take 2.5 films under the tongue daily., Disp: 35 Film, Rfl: 0  estradiol (ESTRACE) 0.1 MG/GM vaginal cream, PLACE 5 G VAGINALLY 3 TIMES A WEEK MYLAN BRAND ONLY., Disp: 170 g, Rfl: 0  albuterol HFA 108 (90 Base) MCG/ACT inhaler, INHALE 2 PUFFS INTO THE LUNGS EVERY 6 HOURS AS NEEDED FOR WHEEZING OR SHORTNESS OF BREATH, Disp: 8.5 g, Rfl: 11  naproxen (EC NAPROSYN) 500 MG EC tablet, Take 1 tablet by mouth in the morning and 1 tablet in the evening. Take with meals., Disp: 60 tablet, Rfl: 2  cholecalciferol (VITAMIN D3) 2000 UNIT TABS tablet, Take 1 tablet by mouth in the morning., Disp: 90 tablet, Rfl: 3  alendronate (FOSAMAX) 70 MG tablet, Take '70mg'$  every 7 days with 8 oz of water on empty stomach.  Do not take anything else by mouth and stay upright (do not lie down) for 30 min until 1st food of day., Disp: 13 tablet, Rfl: 3  INCRUSE ELLIPTA 62.5 MCG/INH inhaler, INHALE 1 PUFF BY MOUTH INTO LUNGS DAILY, Disp: 30 each, Rfl: 11  buPROPion (WELLBUTRIN SR) 200 MG 12 hr tablet, Take 1 tablet by mouth in the morning and 1 tablet before bedtime., Disp: 180 tablet, Rfl: 3  polyethylene glycol (GOLYTELY) 236 g suspension, Take 4,000 mLs by mouth See Admin Instructions Take as directed prior to your colonoscopy, Disp: 4000 mL, Rfl: 0  Simethicone 80 MG TABS, Take 4 tablets by mouth See Admin Instructions Take as directed prior to colonoscopy., Disp: 4 tablet, Rfl: 0  naproxen (NAPROSYN) 500 MG tablet, TAKE 1 TABLET BY MOUTH WITH FOOD EVERY TWELVE HOURS  AS NEEDED FOR PAIN, Disp: 20 tablet, Rfl: 0  acetaminophen (TYLENOL) 500 MG tablet, Take 500 mg by mouth every 6 (six) hours as needed for Pain, Disp: , Rfl:   ipratropium-albuterol (DUO-NEB) 0.5-2.5 (3) MG/3ML SOLN Inhalation Solution, Take 3 mLs by nebulization 4 (four) times daily., Disp: 180 mL, Rfl: 0    No current facility-administered medications on file prior to visit.      PHQ-9 TOTAL SCORE 01/12/2019 06/01/2018 03/03/2017   Doc FlowSheet Total Score - - -   Doc FlowSheet Total Score '10 6 4           '$ PHYSICAL EXAMINATION via video:   General appearance - healthy female in no distress  Neuro - nonfocal  Affect - normal  Behavior without evidence of intoxication or impairment    PMP Reviewed.  No unauthorized prescriptions since last refill.     Assessment:  Opioid Dependence  Comment: Stable in recovery. Requested nicotine patch - rx sent.  Plan:  Continue buprenorphine, reviewed criteria for tapering when patient is ready. Discussed sources of support.  Patient is counseled regarding relapse prevention, involvement in recovery groups, and potential side effects of buprenorphine.    For the patient participating?in the group via telephone and/or video technology: the patient/guardian has verbally consented to?participate in this group therapy session?by televisit. The patient/guardian was?encouraged to be in a private location  due to personal health information being discussed?and where they could not be overheard by individuals not participating in the group therapy. Although all [facility] staff/providers participating in this group therapy televisit are in a private location, all the risks of telephone and/or video technology used to conduct this group therapy session cannot be controlled by [place], (i.e. interruptions, unauthorized access/recording and technical difficulties).? Each patient understands that the visit can be stopped at any time for any reason, including if the conferencing connections are inadequate.      Julieta Gutting, MD, 08/13/2021

## 2021-08-14 MED FILL — INCRUSE ELPT INH 62.5MCG: 30 days supply | Qty: 30 | Fill #0

## 2021-08-19 ENCOUNTER — Ambulatory Visit (INDEPENDENT_AMBULATORY_CARE_PROVIDER_SITE_OTHER): Payer: 59 | Admitting: Family Medicine

## 2021-08-21 ENCOUNTER — Ambulatory Visit (HOSPITAL_BASED_OUTPATIENT_CLINIC_OR_DEPARTMENT_OTHER): Payer: 59 | Admitting: Internal Medicine

## 2021-08-21 NOTE — Progress Notes (Deleted)
Subjective     Cheryl Dixon is a 66 year old female with COPD, OUD, osteoporosis, depression, OA of knees, seen for preoperative evaluation for TKR.    Intrinsic cardiac risk of the surgery is intermediate. This patient has no RCRI risk factors (revised cardiac risk factors include CAD, CHF, CVA, DM, Cr>2.0). Patient has excellent exercise capacity and can achieve > 4 METS without CP or SOB.     Prior cardiac testing:  EKG normal 2020  No prior stress test or echo.    Other perioperative management issues:  TAA:   Initially noted [facility] at [facility] November [facility].  Thoracic surgery consult at [facility] March 2022 recommended follow up scan in 6 months and echo to assess valve, but these were never done.  In March, patient was referred for repeat CT of the chest, echocardiogram, and cardiology appointment for guidance regarding monitoring plan.  Scan has not been done and cardiology appointment is scheduled in June.    COPD   Taking Incruse without noted side effects.   Using albuterol ***        Social History    Tobacco Use      Smoking status: Some Days        Packs/day: 1.00        Years: 20.00        Pack years: 20        Types: Cigarettes      Smokeless tobacco: Never      Tobacco comments: quit 1980-1996, then light until 2003, then 1 ppd since    Alcohol use: No    Drug use: Yes      Types: Marijuana      Comment: past opioids, nasal heroin, 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  MJ 1x per week    Patient Active Problem List:     Opioid dependence on agonist therapy (HCC)     Tobacco use disorder     Fibrocystic breast     Elevated BP without diagnosis of hypertension     Knee pain     Chronic low back pain     OA (osteoarthritis) of knee     H. pylori infection     Major depressive disorder, recurrent episode, severe (HCC)     Occult blood in stools     Prediabetes     Epigastric pain     Chronic obstructive pulmonary disease (HCC)     Left foot pain     Adenomatous polyp of colon     Acquired deformity of toe, right      Dislocation of metatarsophalangeal joint of lesser toe, right, sequela     Postmenopausal atrophic vaginitis     Abnormal loss of weight     Thoracic aortic aneurysm without rupture (HCC)     COVID-19 virus infection     BMI 32.0-32.9,adult     Dysuria     LLQ pain     Age-related osteoporosis without current pathological fracture    buprenorphine-naloxone (SUBOXONE) 8-2 MG sublingual film, Take 2.5 films under the tongue daily., Disp: 35 Film, Rfl: 0  nicotine (NICODERM CQ) 14 MG/24HR, Place 1 patch onto the skin in the morning., Disp: 30 patch, Rfl: 1  estradiol (ESTRACE) 0.1 MG/GM vaginal cream, PLACE 5 G VAGINALLY 3 TIMES A WEEK MYLAN BRAND ONLY., Disp: 170 g, Rfl: 0  albuterol HFA 108 (90 Base) MCG/ACT inhaler, INHALE 2 PUFFS INTO THE LUNGS EVERY 6 HOURS AS NEEDED FOR WHEEZING OR SHORTNESS OF BREATH,  Disp: 8.5 g, Rfl: 11  naproxen (EC NAPROSYN) 500 MG EC tablet, Take 1 tablet by mouth in the morning and 1 tablet in the evening. Take with meals., Disp: 60 tablet, Rfl: 2  cholecalciferol (VITAMIN D3) 2000 UNIT TABS tablet, Take 1 tablet by mouth in the morning., Disp: 90 tablet, Rfl: 3  alendronate (FOSAMAX) 70 MG tablet, Take '70mg'$  every 7 days with 8 oz of water on empty stomach.  Do not take anything else by mouth and stay upright (do not lie down) for 30 min until 1st food of day., Disp: 13 tablet, Rfl: 3  INCRUSE ELLIPTA 62.5 MCG/INH inhaler, INHALE 1 PUFF BY MOUTH INTO LUNGS DAILY, Disp: 30 each, Rfl: 11  buPROPion (WELLBUTRIN SR) 200 MG 12 hr tablet, Take 1 tablet by mouth in the morning and 1 tablet before bedtime., Disp: 180 tablet, Rfl: 3  polyethylene glycol (GOLYTELY) 236 g suspension, Take 4,000 mLs by mouth See Admin Instructions Take as directed prior to your colonoscopy, Disp: 4000 mL, Rfl: 0  Simethicone 80 MG TABS, Take 4 tablets by mouth See Admin Instructions Take as directed prior to colonoscopy., Disp: 4 tablet, Rfl: 0  naproxen (NAPROSYN) 500 MG tablet, TAKE 1 TABLET BY MOUTH WITH FOOD EVERY  TWELVE HOURS AS NEEDED FOR PAIN, Disp: 20 tablet, Rfl: 0  acetaminophen (TYLENOL) 500 MG tablet, Take 500 mg by mouth every 6 (six) hours as needed for Pain, Disp: , Rfl:   ipratropium-albuterol (DUO-NEB) 0.5-2.5 (3) MG/3ML SOLN Inhalation Solution, Take 3 mLs by nebulization 4 (four) times daily., Disp: 180 mL, Rfl: 0    No current facility-administered medications for this visit.    Review of Patient's Allergies indicates:   Darvon                     Meperidine hcl              Comment:Nausea/vomit   Paxil [paroxetine]      Rash   Zoloft [sertraline *    Rash            Objective     LMP 11/20/1991               Plan   ***    Risk of cardiac death, non-fatal MI, and non-fatal cardiac arrest:  No risk factors: 0.4%  One risk factor: 1.0%  Two risk factors 2.4%  Three or more risk factors 5.4%    Other issues:  I have recommended that she have her CT of the chest done to demonstrate stability of the thoracic aortic aneurysm.  She does not need to have the echocardiogram (to evaluate whether there is a bileaflet aortic valve) or the cardiology consult (for guidance on monitoring plan for TAA) prior to surgery unless there is a significant change in her aneurysm.    We discussed the patient's current medications. The patient expressed understanding and no barriers to adherence were identified.  1. The patient indicates understanding of these issues and agrees with the plan.  Brief care plan is updated and reviewed with the patient.   2. The patient is given an After Visit Summary sheet that lists all medications with directions, allergies, orders placed during this encounter, and follow-up instructions.   3. I reviewed the patient's medical information and medical history   4. I reconciled the patient's medication list and prepared and supplied needed refills.   5. I have reviewed the past medical, family, and social history sections including  the medications and allergies.    [name], MD

## 2021-08-22 ENCOUNTER — Telehealth (HOSPITAL_BASED_OUTPATIENT_CLINIC_OR_DEPARTMENT_OTHER): Payer: Self-pay

## 2021-08-23 ENCOUNTER — Ambulatory Visit (HOSPITAL_BASED_OUTPATIENT_CLINIC_OR_DEPARTMENT_OTHER): Payer: 59 | Admitting: Physician Assistant

## 2021-08-23 MED FILL — NICOTINE TD DIS 21MG/24H: 28 days supply | Qty: 28 | Fill #0

## 2021-08-27 ENCOUNTER — Ambulatory Visit: Payer: 59 | Admitting: Clinical

## 2021-08-27 ENCOUNTER — Ambulatory Visit (HOSPITAL_BASED_OUTPATIENT_CLINIC_OR_DEPARTMENT_OTHER): Payer: 59 | Admitting: Family Medicine

## 2021-08-27 ENCOUNTER — Other Ambulatory Visit (HOSPITAL_BASED_OUTPATIENT_CLINIC_OR_DEPARTMENT_OTHER): Payer: Self-pay | Admitting: Family Medicine

## 2021-08-27 DIAGNOSIS — Z5321 Procedure and treatment not carried out due to patient leaving prior to being seen by health care provider: Secondary | ICD-10-CM

## 2021-08-27 DIAGNOSIS — F1120 Opioid dependence, uncomplicated: Secondary | ICD-10-CM

## 2021-08-27 MED ORDER — BUPRENORPHINE HCL-NALOXONE HCL 8-2 MG SL FILM
ORAL_FILM | SUBLINGUAL | 0 refills | Status: DC
Start: 2021-08-27 — End: 2021-09-10

## 2021-08-27 NOTE — Progress Notes (Signed)
Unable to connect with patient  .

## 2021-08-28 ENCOUNTER — Ambulatory Visit (HOSPITAL_BASED_OUTPATIENT_CLINIC_OR_DEPARTMENT_OTHER): Payer: 59 | Admitting: Physician Assistant

## 2021-08-28 NOTE — Progress Notes (Deleted)
Cheryl Dixon is a 66 year old female pt of Dr. [name], MD who presents for ***      preop  Planning TKR    COPD    Thoracic aneurysm  Need echo and CTA chest

## 2021-08-29 ENCOUNTER — Ambulatory Visit: Payer: 59 | Admitting: Rehabilitative and Restorative Service Providers"

## 2021-08-29 DIAGNOSIS — M1711 Unilateral primary osteoarthritis, right knee: Secondary | ICD-10-CM

## 2021-08-29 NOTE — Progress Notes (Signed)
Pt presents today for pre-operative Physical Therapy appointment, with right total knee replacement scheduled for 09/17/21 with Dr. Adelene Amas.    Pt has attended the Total Joint Education Class.    Pt lives alone, and will have near-by daughter or grandchildren present at time of discharge to assist with their care.     Pt has elevator to her apartment  and no stairs once in her apartment.  Pt will utilize daughter or friend for transportation after surgery.    Pt ambulates with cane for community distances, no assistive devices inside.    Reviewed correct use of RW for post-operative functional mobility. Pt has cane as adaptive equipment at home. Pt advised to obtain RW  And commode to facilitate safe return to home post-operatively. Patient was given an informational handout for acquiring recommended DME (sent to email).    Pt was educated on post-operative precautions and expectations, including the importance of Physical Therapy participation. Pt was provided with a pre-operative home exercise program to optimize functional mobility and rehab outcomes.     All questions and concerns were addressed today. Pt was encouraged to contact the rehab department at [phone] with anything further.     Fayne Mediate, [place], [place] [id number]

## 2021-09-06 ENCOUNTER — Telehealth (HOSPITAL_BASED_OUTPATIENT_CLINIC_OR_DEPARTMENT_OTHER): Payer: Self-pay

## 2021-09-06 NOTE — Telephone Encounter (Addendum)
Tried to return call to patient, no answer, left message on voicemail to call clinic back.    Cheryl Dixon [id number], 66 year old, female  ?  Calls today:  Clinical Questions (NON-SICK CLINICAL QUESTIONS ONLY)    Name of person calling   Specific nature of request Patient is calling wanting to change laxative due to her vitamin D intake, will like a call back. Thank you   Return phone number [phone]  ?  Person calling on behalf of patient: Patient (self)  ?  Patient's language of care: English  ?  Patient does not need an interpreter.  ?  Patient's PCP: [name], MD  ?  Primary Care Home Site:  [facility]

## 2021-09-06 NOTE — PostOp Call (Unsigned)
Cheryl Dixon [id number], 66 year old, female    Calls today:  Clinical Questions (NON-SICK CLINICAL QUESTIONS ONLY)    Name of person calling   Specific nature of request Patient is calling wanting to change laxative due to her vitamin D intake, will like a call back. Thank you   Return phone number [phone]    Person calling on behalf of patient: Patient (self)    Patient's language of care: English    Patient does not need an interpreter.    Patient's PCP: [name], MD    Primary Care Home Site:  [facility]

## 2021-09-09 ENCOUNTER — Ambulatory Visit (HOSPITAL_BASED_OUTPATIENT_CLINIC_OR_DEPARTMENT_OTHER)
Admission: RE | Admit: 2021-09-09 | Discharge: 2021-09-09 | Disposition: A | Discharge status: Home / Self Care | Payer: 59 | Source: Ambulatory Visit

## 2021-09-09 ENCOUNTER — Encounter (HOSPITAL_BASED_OUTPATIENT_CLINIC_OR_DEPARTMENT_OTHER): Payer: Self-pay | Admitting: Anesthesiology

## 2021-09-09 ENCOUNTER — Telehealth (HOSPITAL_BASED_OUTPATIENT_CLINIC_OR_DEPARTMENT_OTHER): Payer: Self-pay | Admitting: Specialist

## 2021-09-09 ENCOUNTER — Ambulatory Visit (HOSPITAL_BASED_OUTPATIENT_CLINIC_OR_DEPARTMENT_OTHER): Payer: 59

## 2021-09-09 ENCOUNTER — Other Ambulatory Visit (HOSPITAL_BASED_OUTPATIENT_CLINIC_OR_DEPARTMENT_OTHER): Payer: Self-pay | Admitting: Physician Assistant

## 2021-09-09 NOTE — Discharge Instructions (Signed)
SURGICAL DAY CARE PRE-OPERATIVE INSTRUCTIONS    DAY OF SURGERY    Arrive at: [facility] Registration on Tuesday, May  23 at (Time): 6:00 am .    From now until after surgery do not take any Aspirin, or Ibuprofen which includes - Naproxen,Advil ,Motrin, and Alleve. You may take Tylenol.      You must have a responsible adult available to accompany you home after surgery. (We suggest that you have someone available to assist you at home after your surgery).    INSTRUCTIONS:     You may have nothing to eat or drink after midnight the night before your surgery, not even water, gum or hard candy.     You may not smoke the morning of your surgery.    Please leave all valuables at home, including jewelry, watches, money, etc.    Please remove all fingernail polish before arriving at Surgical Day Care and do not  wear any face or lip make-up.    If you are having eye surgery, do not wear any eye or face makeup and avoid facial moisturizers and perfumes.    Please take out any hair extensions that can be removed.    Do not shave surgical site.    Do not wear contact lenses.    MEDICATIONS:     Take the following medication the night before surgery at bedtime: usual medicines    Take the following medication the morning of your surgery with only a sip of water: all inhalers bupropion suboxone   no nicotine patch on skin in am

## 2021-09-09 NOTE — Anesthesia Preprocedure Evaluation (Signed)
Pre-Anesthetic Note  .      Patient: Cheryl Dixon is a 66 year old female      Procedure Information     Date: 09/17/21    Procedure: ARTHROPLASTY, KNEE, TOTAL (Right)    Diagnosis: Primary osteoarthritis of right knee [M17.11]    Pre-op diagnosis: Primary osteoarthritis of right knee [M17.11]    Location: [place] OR    Surgeons: Floyde Parkins, MD          Relevant Problems   PULMONARY   (+) Chronic obstructive pulmonary disease ([place])      CARDIO   (+) Thoracic aortic aneurysm without rupture ([place])           Previous Anesthetic History:   Past Surgical History:  No date: ENDOMETRIAL ABLTJ THERMAL W/O HYSTEROSCOPIC GID  No date: PR ANES HRNA REPAIR UPR ABD TABDL RPR DIPHRG HRNA  No date: PR ANES IPER LOWER ABD W/LAPS RAD HYSTERECTOMY  No date: RPR 1ST INGUN HRNA PRETERM INFT RDC        Medications  Current Outpatient Medications   Medication Sig   ? buprenorphine-naloxone (SUBOXONE) 8-2 MG sublingual film Take 2.5 films under the tongue daily.   ? nicotine (NICODERM CQ) 14 MG/24HR Place 1 patch onto the skin in the morning.   ? estradiol (ESTRACE) 0.1 MG/GM vaginal cream PLACE 5 G VAGINALLY 3 TIMES A WEEK MYLAN BRAND ONLY.   ? albuterol HFA 108 (90 Base) MCG/ACT inhaler INHALE 2 PUFFS INTO THE LUNGS EVERY 6 HOURS AS NEEDED FOR WHEEZING OR SHORTNESS OF BREATH   ? naproxen (EC NAPROSYN) 500 MG EC tablet Take 1 tablet by mouth in the morning and 1 tablet in the evening. Take with meals.   ? cholecalciferol (VITAMIN D3) 2000 UNIT TABS tablet Take 1 tablet by mouth in the morning.   ? alendronate (FOSAMAX) 70 MG tablet Take '70mg'$  every 7 days with 8 oz of water on empty stomach.  Do not take anything else by mouth and stay upright (do not lie down) for 30 min until 1st food of day.   ? INCRUSE ELLIPTA 62.5 MCG/INH inhaler INHALE 1 PUFF BY MOUTH INTO LUNGS DAILY   ? buPROPion (WELLBUTRIN SR) 200 MG 12 hr tablet Take 1 tablet by mouth in the morning and 1 tablet before bedtime.   ? polyethylene glycol (GOLYTELY) 236 g  suspension Take 4,000 mLs by mouth See Admin Instructions Take as directed prior to your colonoscopy   ? Simethicone 80 MG TABS Take 4 tablets by mouth See Admin Instructions Take as directed prior to colonoscopy.   ? naproxen (NAPROSYN) 500 MG tablet TAKE 1 TABLET BY MOUTH WITH FOOD EVERY TWELVE HOURS AS NEEDED FOR PAIN   ? acetaminophen (TYLENOL) 500 MG tablet Take 500 mg by mouth every 6 (six) hours as needed for Pain   ? ipratropium-albuterol (DUO-NEB) 0.5-2.5 (3) MG/3ML SOLN Inhalation Solution Take 3 mLs by nebulization 4 (four) times daily.     No current facility-administered medications for this encounter.         Allergies:   Review of Patient's Allergies indicates:   Darvon                     Meperidine hcl              Comment:Nausea/vomit   Paxil [paroxetine]      Rash   Zoloft [sertraline *    Rash    Smoking, Alcohol, Drugs:  Social History  Tobacco Use      Smoking status: Some Days        Packs/day: 1.00        Years: 20.00        Pack years: 20        Types: Cigarettes      Smokeless tobacco: Never      Tobacco comments: quit 1980-1996, then light until 2003, then 1 ppd since    Vaping Use      Vaping status: Not on file    Alcohol use: No      Drug use:    Types: Marijuana   Comment: past opioids, nasal heroin, 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  MJ 1x per week       PMHx:  Past Medical History:  No date: Anxiety  No date: Arthritis  No date: COPD (chronic obstructive pulmonary disease) (HCC)  No date: Depression  No date: Obese  No date: Substance addiction ([place])  No date: Varicose veins of lower extremities    Vitals  LMP 11/20/1991     Review of Systems            Cardiovascular: Positive for hypertension and valvular problems/murmurs (thoracic aortic aneurysm).        Cardiac surgeyr note [facility] 07/02/20  Impression: 49F with PMH of COPD, current 20 pk year smoker, h/o  polysubstance abuse, FH of aortic aneurysm who presented to OSH  with chest discomfort and was found to have 4.4cm ascending  aorta. Patient  will need TTE to evaluate for bicuspid aortic  valve and continued surveillance of ascending aneurysm.       Plan:  1. Aortic surveillance- Repeat CTA Chest in 6 months  2. BP management goal SBP 120-130  3. Continue with Smoking cessation  4. Cardiology referral   5. Transthoracic echo  6. F/U with cardiac surgery in 6 months after CT imaging is  obtained      Addendum by Dewain Penning, MD on 07/18/20 at 2:33 pm:  Agree with above.  Current smoker with 4.4cm ascending aorta. We  discussed the natural history of aortic aneurysms and possible  complications which are extremely unlikely at this point. No  indication for aortic replacement at this point - will need TTE  to delineate trileaflet vs bicuspid valve, as well as repeat CTA  chest in 6 months.    PCP eval 06/28/21 David Roll  Plan   Chronic obstructive pulmonary disease ([place])  Stable on current inhalers.  Never had PFTs, but clinically has not had a lot of exacerbations or severe symptoms.  - continue Incruse and albuterol PRN  Thoracic aortic aneurysm without rupture [facility])  Initially noted on CT[facility]PA at [facility] November 2021.  Thoracic surgery consult at [facility] March 2022 recommended follow up scan in 6 months and echo to assess valve, but these were never done.  Plan:  - echo to assess for aortic valve disease or bicuspid valve  - CTA chest  - referred to cardiology to advise regarding monitoring plan  Age-related osteoporosis without current pathological fracture  Not tolerating vitamin D 2000 units  - try 1/2 tab  - if still causing nausea there are multiple other formulations available  Opioid dependence on agonist therapy ([place])  Stable on buprenorphine  - continue buprenorphine  Tobacco use disorder  Quit March 2023 in preparation for knee surgery  - congratulated on success so far.  Discussed relapse prevention strategies  ?    EKG 06/14/18  Vent. Rate :  061 BPM ? ? Atrial Rate : 061 BPM ?   ? ?P-R Int : 130 ms ? ? ? ? ?QRS Dur : 088 ms ?   ? ? QT Int :  414 ms ? ? ? P-R-T Axes : 052 018 071 degrees ?   ? ?QTc Int : 416 ms ?   ? ?   Normal sinus rhythm ?   Normal ECG ?   No previous ECGs available ?    Pulmonary: Positive for COPD and tobacco use.   Endocrine: Diabetes: preDM.   Musculoskeletal:        Osteoporosis   Psychiatric: Positive for depression.         Polysubstance abuse, on Suboxone 8-2 2.5 daily        PHY EXAM        Pertinent Labs:   Lab Results   Component Value Date    NA 143 10/22/2020    K 4.5 10/22/2020    CREAT 0.9 10/22/2020    GLUCOSER 91 10/22/2020    WBC 5.7 03/01/2020    HCT 43.6 03/01/2020    PLTA 288 03/01/2020    PT 10.0 02/16/2007    APTT 26.1 02/16/2007    INR 1.0 (L) 02/16/2007         Anesthesia Plan    ASA Score:     ASA:  3    Anesthesia Assessment and Plan:        Chart review only; patient canceled surgery. I Suggested to Dr Adelene Amas in any case that she should have workup of ascending aortic aneurysm done prior to surgery.     07/02/20: Ascending aortic aneurysm, 4.4 cm, [facility] by cardiac surgery and was to have ECHO, CTA chest and followup; not done. Now testing ordered for here as of 06/28/21 by PCP Dr Jenne Pane but not done. Scheduled appointment 10/07/21 with Dr Rolena Infante.     Other problems: HTN. COPD. Tobacco use. Polysubstance abuse, on Suboxone 8-2, 2.5 strips a day. PreDM. Depression.

## 2021-09-09 NOTE — Telephone Encounter (Signed)
PER Pharmacy, Cheryl Dixon is a 66 year old female has requested a refill of naproxen.      Last Office Visit: 06/14/2021 Floyde Parkins, MD     Last Physical Exam: 05/27/2013    There are no preventive care reminders to display for this patient.    Other Med Adult:  Most Recent BP Reading(s)  07/10/21 : 137/86        Cholesterol (mg/dL)   Date Value   10/22/2020 233     LOW DENSITY LIPOPROTEIN DIRECT (mg/dL)   Date Value   10/22/2020 158     HIGH DENSITY LIPOPROTEIN (mg/dL)   Date Value   10/22/2020 40     No results found for: TG      THYROID SCREEN TSH REFLEX FT4 (uIU/mL)   Date Value   01/12/2019 1.630         No results found for: TSH    HEMOGLOBIN A1C (%)   Date Value   01/12/2019 4.6       No results found for: POCA1C      INR (no units)   Date Value   02/16/2007 1.0 (L)   07/03/2006 < 1.0 (L)       SODIUM (mmol/L)   Date Value   10/22/2020 143       POTASSIUM (mmol/L)   Date Value   10/22/2020 4.5           CREATININE (mg/dL)   Date Value   10/22/2020 0.9       Documented patient preferred pharmacies:    [id number] [address] Phone: [phone]: [phone]

## 2021-09-09 NOTE — Telephone Encounter (Signed)
Patient called requesting to cancel her surgery with Dr. Adelene Amas stating that she is very confused right now in her life and she is not sure at this point if she wants to do the surgery.  I cancel all the appointments connected to the surgery and notified the surgeon.

## 2021-09-10 ENCOUNTER — Encounter (HOSPITAL_BASED_OUTPATIENT_CLINIC_OR_DEPARTMENT_OTHER): Payer: Self-pay | Admitting: Specialist

## 2021-09-10 ENCOUNTER — Ambulatory Visit (HOSPITAL_BASED_OUTPATIENT_CLINIC_OR_DEPARTMENT_OTHER): Payer: 59 | Admitting: Physician Assistant

## 2021-09-10 ENCOUNTER — Telehealth (HOSPITAL_BASED_OUTPATIENT_CLINIC_OR_DEPARTMENT_OTHER): Payer: Self-pay | Admitting: Internal Medicine

## 2021-09-10 ENCOUNTER — Other Ambulatory Visit (HOSPITAL_BASED_OUTPATIENT_CLINIC_OR_DEPARTMENT_OTHER): Payer: Self-pay | Admitting: Internal Medicine

## 2021-09-10 DIAGNOSIS — F1120 Opioid dependence, uncomplicated: Secondary | ICD-10-CM

## 2021-09-10 MED ORDER — BUPRENORPHINE HCL-NALOXONE HCL 8-2 MG SL FILM
ORAL_FILM | SUBLINGUAL | 0 refills | Status: DC
Start: 2021-09-10 — End: 2021-09-24

## 2021-09-10 MED FILL — INCRUSE ELPT INH 62.5MCG: 30 days supply | Qty: 30 | Fill #0

## 2021-09-10 NOTE — Telephone Encounter (Signed)
Patient attempted to connect to group today but was having technological issues.  Has been stable in recovery  Suboxone rx sent.  [facility] nurse will follow up to check in.    Julieta Gutting, MD

## 2021-09-10 NOTE — Progress Notes (Signed)
Cheryl Parkins, MD  Maceo Pro, APRN; [name], MD; Valli Glance, MD  Cc: Donalda Ewings, RN  Marcie Bal: Thanks for the update. ?I just saw the message from my administrative assistant, Elder Negus, indicating that the patient wishes to cancel her surgery at this time. ?It looks like she needs a fair amount of work-up ahead of time anyhow. ?Thanks. ?Bill.       ?   Previous Messages    ?  ----- Message -----   From: Maceo Pro, APRN   Sent: 09/09/2021 ?11:14 AM EDT   To: [name], MD; Valli Glance, MD; *   Subject: preop 09/17/21 TKA ? ? ? ? ? ? ? ? ? ? ? ? ? ?     Dr Adelene Amas,     We are scheduled to see Cheryl Dixon today in PAT in preparation for her TKA. Going through the chart, including Dr Silvana Newness notes, I see that she has an ascending aortic aneurysm 4.4 cm diameter noted in 2021. She was seen at [facility] ?cardiac surgery on 07/02/20 and was to have an Echo, repeat CTA of chest, and then return visit to cardiac surgery, none of which happened. Just recently Dr Lisa Roca reordered the tests although they never got scheduled. She does have an appointment 10/07/21 with Dr Rolena Infante.   Should we wait until this issue gets evaluated to schedule her surgery? We will need to emphasize with her the need to diligently follow up, and I can give her a call too.     Thanks,   Maceo Pro

## 2021-09-10 NOTE — Telephone Encounter (Signed)
Cheryl Dixon [id number], 66 year old, female    Calls today:  Clinical Questions ([place])    Name of person calling patient   Specific nature of request patient was told her suboxone appt today was canceled and wanted to know if her medication will be filled please advise   Return phone number [phone]    Person calling on behalf of patient: Patient (self)        Patient's language of care: English    Patient does not need an interpreter.    Patient's PCP: [name], MD    Primary Care Home Site:  [facility]

## 2021-09-10 NOTE — Telephone Encounter (Signed)
Cheryl Dixon [id number], 66 year old, female    Calls today:  Medication Questions  Patient Calling    What medication do you have questions about buprenorphine-naloxone (SUBOXONE) 8-2 MG sublingual film      What is your question: Pt is requesting a refill request.    Return phone number [phone]      Person calling on behalf of patient: Patient (self)      Patient's language of care: English    Patient does not need an interpreter.    Patient's PCP: [name], MD    Primary Care Home Site:  [facility]

## 2021-09-11 ENCOUNTER — Other Ambulatory Visit (HOSPITAL_BASED_OUTPATIENT_CLINIC_OR_DEPARTMENT_OTHER): Payer: Self-pay | Admitting: Internal Medicine

## 2021-09-11 MED ORDER — POLYETHYLENE GLYCOL 3350 17 G PO PACK
PACK | ORAL | 2 refills | Status: DC
Start: 2021-09-11 — End: 2021-10-02

## 2021-09-11 NOTE — Telephone Encounter (Signed)
PER Pharmacy, Cheryl Dixon is a 66 year old female has requested a refill of healthylax.      Last Office Visit: [id number] with Alwyn Ren  Last Physical Exam: [id number]      Other Med Adult:  Most Recent BP Reading(s)  07/10/21 : 137/86        Cholesterol (mg/dL)   Date Value   10/22/2020 233     LOW DENSITY LIPOPROTEIN DIRECT (mg/dL)   Date Value   10/22/2020 158     HIGH DENSITY LIPOPROTEIN (mg/dL)   Date Value   10/22/2020 40     No results found for: TG      THYROID SCREEN TSH REFLEX FT4 (uIU/mL)   Date Value   01/12/2019 1.630         No results found for: TSH    HEMOGLOBIN A1C (%)   Date Value   01/12/2019 4.6       No results found for: POCA1C      INR (no units)   Date Value   02/16/2007 1.0 (L)   07/03/2006 < 1.0 (L)       SODIUM (mmol/L)   Date Value   10/22/2020 143       POTASSIUM (mmol/L)   Date Value   10/22/2020 4.5           CREATININE (mg/dL)   Date Value   10/22/2020 0.9       Documented patient preferred pharmacies:    [id number] [address] Phone: [phone]: [phone]

## 2021-09-12 ENCOUNTER — Ambulatory Visit (HOSPITAL_BASED_OUTPATIENT_CLINIC_OR_DEPARTMENT_OTHER): Payer: 59

## 2021-09-12 ENCOUNTER — Ambulatory Visit (HOSPITAL_BASED_OUTPATIENT_CLINIC_OR_DEPARTMENT_OTHER): Payer: 59 | Admitting: Specialist

## 2021-09-13 ENCOUNTER — Ambulatory Visit (HOSPITAL_BASED_OUTPATIENT_CLINIC_OR_DEPARTMENT_OTHER): Payer: 59

## 2021-09-17 DIAGNOSIS — M179 Osteoarthritis of knee, unspecified: Secondary | ICD-10-CM | POA: Insufficient documentation

## 2021-09-19 MED FILL — NICOTINE TD DIS 21MG/24H: 28 days supply | Qty: 28 | Fill #0

## 2021-09-24 ENCOUNTER — Ambulatory Visit: Payer: 59 | Attending: Family Medicine | Admitting: Family Medicine

## 2021-09-24 ENCOUNTER — Ambulatory Visit: Payer: 59 | Attending: Psychosomatic Medicine | Admitting: Clinical

## 2021-09-24 ENCOUNTER — Encounter (HOSPITAL_BASED_OUTPATIENT_CLINIC_OR_DEPARTMENT_OTHER): Payer: Self-pay

## 2021-09-24 DIAGNOSIS — F3341 Major depressive disorder, recurrent, in partial remission: Secondary | ICD-10-CM | POA: Diagnosis not present

## 2021-09-24 DIAGNOSIS — F1120 Opioid dependence, uncomplicated: Secondary | ICD-10-CM | POA: Insufficient documentation

## 2021-09-24 MED ORDER — BUPRENORPHINE HCL-NALOXONE HCL 8-2 MG SL FILM
ORAL_FILM | SUBLINGUAL | 0 refills | Status: DC
Start: 2021-09-24 — End: 2021-10-08

## 2021-09-24 MED FILL — ESTRADIOL CRE 0.01%: 80 days supply | Qty: 172 | Fill #0

## 2021-09-24 NOTE — Progress Notes (Signed)
Cheryl Dixon is a 66 year old female seen in follow up for opioid use disorder:    Buprenorphine dose: '20mg'$   Response, adequacy of dose: good  Relapses/close calls: none  Trigger: denies  Meetings: none currently    Social history/events:  Doing well on suboxone which also helps her with pain  Had another granddaughter! Diamond-Rose, month old today - had them over for easter    Present Medications:  polyethylene glycol (HEALTHYLAX) 17 g packet, TAKE 17G BY MOUTH DAILY MIX IN 64 OUNCE OF CLEAR LIQUID AND TAKE as directed, Disp: 28 packet, Rfl: 2  EC-NAPROXEN 500 MG EC tablet, TAKE 1 TABLET BY MOUTH IN THE MORNING AND IN THE EVENING WITH MEALS, Disp: 60 tablet, Rfl: 2  buprenorphine-naloxone (SUBOXONE) 8-2 MG sublingual film, Take 2.5 films under the tongue daily., Disp: 35 Film, Rfl: 0  nicotine (NICODERM CQ) 14 MG/24HR, Place 1 patch onto the skin in the morning., Disp: 30 patch, Rfl: 1  estradiol (ESTRACE) 0.1 MG/GM vaginal cream, PLACE 5 G VAGINALLY 3 TIMES A WEEK MYLAN BRAND ONLY., Disp: 170 g, Rfl: 0  albuterol HFA 108 (90 Base) MCG/ACT inhaler, INHALE 2 PUFFS INTO THE LUNGS EVERY 6 HOURS AS NEEDED FOR WHEEZING OR SHORTNESS OF BREATH, Disp: 8.5 g, Rfl: 11  cholecalciferol (VITAMIN D3) 2000 UNIT TABS tablet, Take 1 tablet by mouth in the morning., Disp: 90 tablet, Rfl: 3  alendronate (FOSAMAX) 70 MG tablet, Take '70mg'$  every 7 days with 8 oz of water on empty stomach.  Do not take anything else by mouth and stay upright (do not lie down) for 30 min until 1st food of day., Disp: 13 tablet, Rfl: 3  INCRUSE ELLIPTA 62.5 MCG/INH inhaler, INHALE 1 PUFF BY MOUTH INTO LUNGS DAILY, Disp: 30 each, Rfl: 11  buPROPion (WELLBUTRIN SR) 200 MG 12 hr tablet, Take 1 tablet by mouth in the morning and 1 tablet before bedtime., Disp: 180 tablet, Rfl: 3  polyethylene glycol (GOLYTELY) 236 g suspension, Take 4,000 mLs by mouth See Admin Instructions Take as directed prior to your colonoscopy, Disp: 4000 mL, Rfl: 0  Simethicone 80 MG  TABS, Take 4 tablets by mouth See Admin Instructions Take as directed prior to colonoscopy., Disp: 4 tablet, Rfl: 0  naproxen (NAPROSYN) 500 MG tablet, TAKE 1 TABLET BY MOUTH WITH FOOD EVERY TWELVE HOURS AS NEEDED FOR PAIN, Disp: 20 tablet, Rfl: 0  acetaminophen (TYLENOL) 500 MG tablet, Take 500 mg by mouth every 6 (six) hours as needed for Pain, Disp: , Rfl:   ipratropium-albuterol (DUO-NEB) 0.5-2.5 (3) MG/3ML SOLN Inhalation Solution, Take 3 mLs by nebulization 4 (four) times daily., Disp: 180 mL, Rfl: 0    No current facility-administered medications on file prior to visit.      PHQ-9 TOTAL SCORE 01/12/2019 06/01/2018 03/03/2017   Doc FlowSheet Total Score - - -   Doc FlowSheet Total Score '10 6 4           '$ PHYSICAL EXAMINATION via video:   General appearance - healthy female in no distress  Neuro - nonfocal  Affect - normal  Behavior without evidence of intoxication or impairment    PMP Reviewed.  No unauthorized prescriptions since last refill.     Assessment:  Opioid Dependence  Comment: Stable in recovery.   Plan:  Continue buprenorphine, reviewed criteria for tapering when patient is ready. Discussed sources of support.  Patient is counseled regarding relapse prevention, involvement in recovery groups, and potential side effects of buprenorphine.  For the patient participatingin the group via telephone and/or video technology: the patient/guardian has verbally consented toparticipate in this group therapy sessionby televisit. The patient/guardian wasencouraged to be in a private location due to personal health information being discussedand where they could not be overheard by individuals not participating in the group therapy. Although all [facility] staff/providers participating in this group therapy televisit are in a private location, all the risks of telephone and/or video technology used to conduct this group therapy session cannot be controlled by [place], (i.e. interruptions, unauthorized access/recording and  technical difficulties). Each patient understands that the visit can be stopped at any time for any reason, including if the conferencing connections are inadequate.     Julieta Gutting, MD, 09/24/2021

## 2021-09-25 NOTE — Progress Notes (Addendum)
[facility] (OFFICE BASED OPIOID TREATMENT)  GROUP PROGRESS NOTE     Televisit consent, as applicable:  For the patient participating in the group via telephone and/or video technology: The patient/guardian has verbally consented to participate in this group therapy session by televisit. The patient/guardian was encouraged to be in a private location due to personal health information being discussed and where they could not be overheard by individuals not participating in the group therapy. Although all [place] staff/providers participating in this group therapy televisit are in a private location, all the risks of telephone and/or video technology used to conduct this group therapy session cannot be controlled by [place], (i.e. interruptions, unauthorized access/recording and technical difficulties).  Each patient understands that the visit can be stopped at any time for any reason, including if the conferencing connections are inadequate.     GROUP NAME:  Office Based Opioid Treatment Group     LEADER(S): Julieta Gutting, MD, Cherlynn Perches, Ph.D., [name], RN, Juanetta Beets (T), and Lewie Loron (T).      Cheryl Dixon is a 66 year old, Divorced, female, who presented for [facility] group visit.   Length of group: 60 min which included 30 min of Health Behavior Intervention and OUD Psychoeducation.     Number of participants in group today: 10    Group Topic Today: Tapering Off Suboxone. This is a Psychoeducational group, and group members learned about Suboxone, a partial opioid antagonist comprised of a combination of buprenorphine and naloxone and a medication approved by the Food and Drug Administration (FDA) to treat opioid use disorder.  Group members learned that while some people will continue taking Suboxone long-term as a maintenance medication, others will follow a Suboxone taper protocol to help in weaning off Suboxone. Group members discussed strategies for tapering off Suboxone while supporting successful recovery.  Group  members discussed strategies to reduce relapse risks and increase healthy coping mechanisms to support recovery and healthy change.      Purpose of Group:   . Education about Opioid Use Disorder (OUD) and treatment  . Enhancing adjustment and coping with OUD  . Improving management of OUD and treatment adherence  . Supporting Recovery Process  . Increasing health promoting behaviors  . Decreasing health risk behaviors and risks of relapse  . Addressing psychological/psychosocial/behavioral barriers to preventing addiction and relapse  . Promotion of functional improvement     Group Interventions Utilized:  Marland Kitchen Psychoeducation related to the psychological, behavioral, and/ or psychosocial aspects of OUD  . Motivational interviewing  . Coping skills training  . Cognitive restructuring  . Emotional awareness and management  . Operant behavior therapy and contingency management  . Stimulus control  . Mindfulness techniques training    Group Process:      Group rules    Review of group goals/structure   Group members learned about Suboxone, a partial opioid antagonist comprised of a combination of buprenorphine and naloxone and a medication approved by the Food and Drug Administration (FDA) to treat opioid use disorder.     Group members discussed strategies for tapering off Suboxone while supporting successful recovery.   Group members reviewed  strategies to increase healthy coping mechanisms to support recovery and change.   Group members explored and shared additional relapse prevention strategies.    Mindfulness and self-compassion strategies discussed.   Discussed support and helpful strategies to promote recovery   Check in            Individual Patient Participation:  Level of participation in the group: Pt presented as engaged and appeared attentive to the group process.   Subjective report of pt's progress: Pt participated in group discussion about tapering off Suboxone and reported doing well: she  feels like Suboxone also helps her with pain. Shacarra does not express any plans to taper off Suboxone.  Pt also shared her joy with the group - she had another granddaughter! The granddaughter's name is Albertha Ghee and she is 92 month old today.   Social History update: no major updates reported  Participation in meetings outside of this group: Not currently  Report on relapses and close calls: reports stable recovery;  denied relapses/close calls.   Affect seems Euthymic.   Relevant changes in mental status: No major changes reported or observed    Diagnosis (addressed by this group):     Opioid dependence on agonist therapy ([place])  (primary encounter diagnosis)  MDD (major depressive disorder), recurrent, in partial remission [facility])    Medical Necessity of Session (how treatment is necessary to improve symptoms, functioning, or prevent worsening): treatment in necessary to enhance compliance with agonist treatment and maintain abstinence from opioids. Suboxone group participation is mandatory to continue Suboxone prescription and is necessary in order to enhance coping with Opioid Use Disorder.            Plan: Pt will continue [facility] treatment     Cherlynn Perches, Ph.D.

## 2021-09-25 NOTE — BH OP Treatment Plan (Signed)
Patient/Guardian:  contributed to the creation of the treatment plan.    Strengths/Skills: Help seeking and motivated to get better     Potential Barriers: Multiple life stressors       Patient stated goals: Pt would like to prevent relapses and support her recovery process     Problem 1: Opioid Dependence on Agonist Therapy (Opioid Use Disorder on Agonist Therapy)      Short Term Goals: Pt will use at least one copying skill a day to reduce a risk of relapse, enhance coping with OUD, and support her recovery process     Short Term Target:  60 days  Short TermTarget Date: 11/23/2021  Short Term Goal #1 Progress: progressing     Long Term Goals: Pt will report 50% in urges and cravings reduction and no relapses or close calls       Long Term Target:  90 days  Long TermTarget Date:  12/23/2021   Long Term Goal #1 Progress:  progressing      Intervention #1: PCBHI-based OBOT/Group Psychotherapy   Intervention Frequency:  biweekly  Intervention Duration:  90 Days  Intervention Responsibility:  J. Verdis Frederickson, PhD and the OBOT team

## 2021-10-02 ENCOUNTER — Other Ambulatory Visit: Payer: Self-pay

## 2021-10-02 ENCOUNTER — Telehealth (HOSPITAL_BASED_OUTPATIENT_CLINIC_OR_DEPARTMENT_OTHER): Payer: Self-pay | Admitting: Internal Medicine

## 2021-10-02 ENCOUNTER — Encounter (HOSPITAL_BASED_OUTPATIENT_CLINIC_OR_DEPARTMENT_OTHER): Payer: Self-pay | Admitting: Internal Medicine

## 2021-10-02 ENCOUNTER — Ambulatory Visit: Payer: 59 | Attending: Internal Medicine | Admitting: Internal Medicine

## 2021-10-02 ENCOUNTER — Other Ambulatory Visit: Payer: Self-pay | Admitting: Gastroenterology

## 2021-10-02 VITALS — BP 138/84 | HR 62 | Temp 98.1°F | Wt 205.8 lb

## 2021-10-02 DIAGNOSIS — F17210 Nicotine dependence, cigarettes, uncomplicated: Secondary | ICD-10-CM | POA: Insufficient documentation

## 2021-10-02 DIAGNOSIS — K635 Polyp of colon: Secondary | ICD-10-CM | POA: Diagnosis present

## 2021-10-02 DIAGNOSIS — N952 Postmenopausal atrophic vaginitis: Secondary | ICD-10-CM | POA: Insufficient documentation

## 2021-10-02 DIAGNOSIS — M1711 Unilateral primary osteoarthritis, right knee: Secondary | ICD-10-CM | POA: Insufficient documentation

## 2021-10-02 DIAGNOSIS — K5903 Drug induced constipation: Secondary | ICD-10-CM | POA: Insufficient documentation

## 2021-10-02 DIAGNOSIS — R3915 Urgency of urination: Secondary | ICD-10-CM | POA: Diagnosis present

## 2021-10-02 DIAGNOSIS — I7121 Aneurysm of the ascending aorta, without rupture: Secondary | ICD-10-CM | POA: Insufficient documentation

## 2021-10-02 DIAGNOSIS — F1120 Opioid dependence, uncomplicated: Secondary | ICD-10-CM | POA: Insufficient documentation

## 2021-10-02 DIAGNOSIS — R1032 Left lower quadrant pain: Secondary | ICD-10-CM | POA: Insufficient documentation

## 2021-10-02 DIAGNOSIS — Z8742 Personal history of other diseases of the female genital tract: Secondary | ICD-10-CM | POA: Diagnosis present

## 2021-10-02 DIAGNOSIS — Z87891 Personal history of nicotine dependence: Secondary | ICD-10-CM | POA: Diagnosis present

## 2021-10-02 DIAGNOSIS — R3911 Hesitancy of micturition: Secondary | ICD-10-CM | POA: Diagnosis present

## 2021-10-02 DIAGNOSIS — F17200 Nicotine dependence, unspecified, uncomplicated: Secondary | ICD-10-CM | POA: Insufficient documentation

## 2021-10-02 DIAGNOSIS — M810 Age-related osteoporosis without current pathological fracture: Secondary | ICD-10-CM | POA: Diagnosis present

## 2021-10-02 DIAGNOSIS — Z122 Encounter for screening for malignant neoplasm of respiratory organs: Secondary | ICD-10-CM | POA: Insufficient documentation

## 2021-10-02 DIAGNOSIS — K6289 Other specified diseases of anus and rectum: Secondary | ICD-10-CM

## 2021-10-02 LAB — URINALYSIS
BILIRUBIN, URINE: NEGATIVE
GLUCOSE, URINE: NEGATIVE MG/DL
KETONE, URINE: NEGATIVE MG/DL
LEUKOCYTE ESTERASE: NEGATIVE
NITRITE, URINE: NEGATIVE
OCCULT BLOOD, URINE: NEGATIVE
PH URINE: 5.5 (ref 5.0–8.0)
PROTEIN, URINE: NEGATIVE MG/DL
SPECIFIC GRAVITY URINE: 1.02 (ref 1.003–1.035)

## 2021-10-02 LAB — CBC WITH PLATELET
ABSOLUTE NRBC COUNT: 0 10*3/uL (ref 0.0–0.0)
HEMATOCRIT: 45.2 % — ABNORMAL HIGH (ref 34.1–44.9)
HEMOGLOBIN: 13.4 g/dL (ref 11.2–15.7)
MEAN CORP HGB CONC: 29.6 g/dL — ABNORMAL LOW (ref 31.0–37.0)
MEAN CORPUSCULAR HGB: 27.9 pg (ref 26.0–34.0)
MEAN CORPUSCULAR VOL: 94 fl (ref 80.0–100.0)
MEAN PLATELET VOLUME: 10.2 fL (ref 8.7–12.5)
NRBC %: 0 % (ref 0.0–0.0)
PLATELET COUNT: 241 10*3/uL (ref 150–400)
RBC DISTRIBUTION WIDTH STD DEV: 44.2 fL (ref 35.1–46.3)
RED BLOOD CELL COUNT: 4.81 M/uL (ref 3.90–5.20)
WHITE BLOOD CELL COUNT: 6.5 10*3/uL (ref 4.0–11.0)

## 2021-10-02 LAB — COMPREHENSIVE METABOLIC PANEL
ALANINE AMINOTRANSFERASE: 13 U/L (ref 12–45)
ALBUMIN: 4.1 g/dL (ref 3.4–5.2)
ALKALINE PHOSPHATASE: 63 U/L (ref 45–117)
ANION GAP: 8 mmol/L — ABNORMAL LOW (ref 10–22)
ASPARTATE AMINOTRANSFERASE: 17 U/L (ref 8–34)
BILIRUBIN TOTAL: 0.4 mg/dL (ref 0.2–1.0)
BUN (UREA NITROGEN): 15 mg/dL (ref 7–18)
CALCIUM: 9.4 mg/dL (ref 8.5–10.5)
CARBON DIOXIDE: 32 mmol/L (ref 21–32)
CHLORIDE: 102 mmol/L (ref 98–107)
CREATININE: 0.9 mg/dL (ref 0.4–1.2)
ESTIMATED GLOMERULAR FILT RATE: >60 mL/min (ref >60)
Glucose Random: 91 mg/dL (ref 74–160)
POTASSIUM: 4.2 mmol/L (ref 3.5–5.1)
SODIUM: 142 mmol/L (ref 136–145)
TOTAL PROTEIN: 6.4 g/dL (ref 6.4–8.2)

## 2021-10-02 LAB — SPECIMEN VALIDITY URINE
SPEC VALIDITY SPECIFIC GRAVITY: 1.02 (ref 1.003–1.035)
SPECIMEN VALIDITY URINE CREAT: 154 mg/dL (ref >20)
SPECIMEN VALIDITY URINE PH: 6.3 (ref 5.0–8.5)

## 2021-10-02 LAB — OXYCODONE SCREEN URINE: OXYCOD SCRN URINE: NEGATIVE ng/mL

## 2021-10-02 LAB — ETHANOL URINE: ETHANOL URINE: NEGATIVE mg/dL

## 2021-10-02 LAB — VITAMIN D,25 HYDROXY: VITAMIN D,25 HYDROXY: 25 ng/mL — ABNORMAL LOW (ref 30.0–100.0)

## 2021-10-02 LAB — BENZODIAZEPINES URINE: BENZODIAZEPINES URINE: POSITIVE ng/mL — AB

## 2021-10-02 LAB — FENTANYL URINE: FENTANYL URINE: NEGATIVE ng/mL

## 2021-10-02 LAB — METHADONE URINE: METHADONE URINE: NEGATIVE ng/mL

## 2021-10-02 LAB — OPIATES URINE: OPIATES URINE: NEGATIVE ng/mL

## 2021-10-02 LAB — COCAINE METABOLITES URINE: COCAINE METABOLITES URINE: NEGATIVE ng/mL

## 2021-10-02 LAB — CANNABINOIDS URINE: CANNABINOIDS URINE: POSITIVE ng/mL — AB

## 2021-10-02 LAB — BUPRENORPHINE SCREEN URINE: BUPRENORPHINE SCREEN URINE: POSITIVE ng/mL — AB

## 2021-10-02 LAB — AMPHETAMINES URINE: AMPHETAMINES URINE: NEGATIVE ng/mL

## 2021-10-02 MED ORDER — POLYETHYLENE GLYCOL 3350 17 G PO PACK
17.0000 g | PACK | Freq: Every day | ORAL | 3 refills | Status: DC
Start: 2021-10-02 — End: 2022-05-28

## 2021-10-02 MED ORDER — RISEDRONATE SODIUM 150 MG PO TABS
150.0000 mg | ORAL_TABLET | ORAL | 3 refills | Status: DC
Start: 2021-10-02 — End: 2022-09-27

## 2021-10-02 MED ORDER — TIOTROPIUM BROMIDE-OLODATEROL 2.5-2.5 MCG/ACT IN AERS
2.0000 | INHALATION_SPRAY | Freq: Every day | RESPIRATORY_TRACT | 5 refills | Status: DC
Start: 2021-10-02 — End: 2022-08-20

## 2021-10-02 NOTE — Progress Notes (Signed)
Subjective     Cheryl Dixon is a 66 year old female with COPD, OUD, osteoporosis, depression, OA of knees, seen for follow up:    LLQ pain  Intermittent.  Gets worse throughout the day.  Radiates to back.  It occurred four days this week.  Sometimes will go weeks without it.  Has no cramps today.  Associated with urinary urgency and difficulty passing urine.  When she doesn't have pain, she has no difficulty passing her urine.  Naproxen helps.  Drinking 1/2 gallon of water per day, plus cranberry supplements.  History of endometriosis, and symptoms are similar to that time.  Saw ob/gyn, who thought pain was musculoskeletal, and said it could bnot be pelvic because all her reproductive organs have been removed.  For urinary urgency and hesitancy, ob/gyn recommended urology    Constipation  Buying miralax and colace.  Currently getting Healthy Lax from pharmacy which smells bad and doesn't work as well as [employer] or other generics    OA knee  Deferred TKR because she was diagnosed with osteoporosis, she had smoked, and she felt like she needed to take care of several other problems first.    COPD  Using Incruse once a day.  Smoking just 1-2 cigarettes per week  Using albuterol several times a day on some days but not every day.    Osteoporosis  Not taking alendronate regularly.  It caused nausea.  Vitamin D previously caused nausea, but she is tolerating that OK now.    TAA  Initially noted on CT-PA at [facility] November 2021.  Thoracic surgery consult at [facility] March 2022 recommended follow up scan in 6 months and echo to assess valve, but these were never done.  At last visit plan was:  - echo to assess for aortic valve disease or bicuspid valve (not scheduled)  - CTA chest (not scheduled)  - referred to cardiology to advise regarding monitoring plan (scheduled for 6/12)    Colon polyps  Overdue for repeat colonoscopy, given large number and large size of polyps    Lung cancer screening  Review of Risk Factors  for Lung Cancer (Eligibility Criteria for Lung Cancer Screening):  These are the most recent risk factors identified by the [place] [employer]. CMS has not adopted the most recent changes.   A review of her chart shows that she is eligible for lung cancer screening due to the following risk factors:   she is a Current smoker of cigarettes. There is documentation of at least a 20 pack year smoking history.  There are no signs/symptoms of lung cancer: correct      Social History    Tobacco Use      Smoking status: Some Days        Packs/day: 1.00        Years: 20.00        Pack years: 20        Types: Cigarettes      Smokeless tobacco: Never      Tobacco comments: quit 1980-1996, then light until 2003, then 1 ppd since    Alcohol use: No    Drug use: Yes      Types: Marijuana      Comment: past opioids, nasal heroin, 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  MJ 1x per week    Patient Active Problem List:     Opioid dependence on agonist therapy ([place])     Tobacco use disorder     Fibrocystic  breast     Elevated BP without diagnosis of hypertension     Knee pain     Chronic low back pain     OA (osteoarthritis) of knee     H. pylori infection     Major depressive disorder, recurrent episode, severe (HCC)     Occult blood in stools     Prediabetes     Epigastric pain     Chronic obstructive pulmonary disease (HCC)     Left foot pain     Multiple polyps of colon     Acquired deformity of toe, right     Dislocation of metatarsophalangeal joint of lesser toe, right, sequela     Postmenopausal atrophic vaginitis     Abnormal loss of weight     Thoracic aortic aneurysm without rupture (HCC)     COVID-19 virus infection     BMI 32.0-32.9,adult     Dysuria     LLQ pain     Age-related osteoporosis without current pathological fracture    buprenorphine-naloxone (SUBOXONE) 8-2 MG sublingual film, Take 2.5 films under the tongue daily., Disp: 35 Film, Rfl: 0  polyethylene glycol (HEALTHYLAX) 17 g packet, TAKE 17G BY MOUTH  DAILY MIX IN 64 OUNCE OF CLEAR LIQUID AND TAKE as directed, Disp: 28 packet, Rfl: 2  EC-NAPROXEN 500 MG EC tablet, TAKE 1 TABLET BY MOUTH IN THE MORNING AND IN THE EVENING WITH MEALS, Disp: 60 tablet, Rfl: 2  nicotine (NICODERM CQ) 14 MG/24HR, Place 1 patch onto the skin in the morning., Disp: 30 patch, Rfl: 1  estradiol (ESTRACE) 0.1 MG/GM vaginal cream, PLACE 5 G VAGINALLY 3 TIMES A WEEK MYLAN BRAND ONLY., Disp: 170 g, Rfl: 0  albuterol HFA 108 (90 Base) MCG/ACT inhaler, INHALE 2 PUFFS INTO THE LUNGS EVERY 6 HOURS AS NEEDED FOR WHEEZING OR SHORTNESS OF BREATH, Disp: 8.5 g, Rfl: 11  cholecalciferol (VITAMIN D3) 2000 UNIT TABS tablet, Take 1 tablet by mouth in the morning., Disp: 90 tablet, Rfl: 3  alendronate (FOSAMAX) 70 MG tablet, Take '70mg'$  every 7 days with 8 oz of water on empty stomach.  Do not take anything else by mouth and stay upright (do not lie down) for 30 min until 1st food of day., Disp: 13 tablet, Rfl: 3  INCRUSE ELLIPTA 62.5 MCG/INH inhaler, INHALE 1 PUFF BY MOUTH INTO LUNGS DAILY, Disp: 30 each, Rfl: 11  buPROPion (WELLBUTRIN SR) 200 MG 12 hr tablet, Take 1 tablet by mouth in the morning and 1 tablet before bedtime., Disp: 180 tablet, Rfl: 3  polyethylene glycol (GOLYTELY) 236 g suspension, Take 4,000 mLs by mouth See Admin Instructions Take as directed prior to your colonoscopy, Disp: 4000 mL, Rfl: 0  Simethicone 80 MG TABS, Take 4 tablets by mouth See Admin Instructions Take as directed prior to colonoscopy., Disp: 4 tablet, Rfl: 0  naproxen (NAPROSYN) 500 MG tablet, TAKE 1 TABLET BY MOUTH WITH FOOD EVERY TWELVE HOURS AS NEEDED FOR PAIN, Disp: 20 tablet, Rfl: 0  acetaminophen (TYLENOL) 500 MG tablet, Take 500 mg by mouth every 6 (six) hours as needed for Pain, Disp: , Rfl:   ipratropium-albuterol (DUO-NEB) 0.5-2.5 (3) MG/3ML SOLN Inhalation Solution, Take 3 mLs by nebulization 4 (four) times daily., Disp: 180 mL, Rfl: 0    No current facility-administered medications for this visit.    Review of  Patient's Allergies indicates:   Darvon  Meperidine hcl              Comment:Nausea/vomit   Paxil [paroxetine]      Rash   Zoloft [sertraline *    Rash            Objective     BP 138/84   Pulse 62   Temp 98.1 F (36.7 C) (Temporal)   Wt 93.4 kg (205 lb 12.8 oz)   LMP 11/20/1991   SpO2 97%   BMI 38.89 kg/m     Physical Exam  Constitutional:       General: She is not in acute distress.  HENT:      Head: Normocephalic and atraumatic.   Eyes:      Conjunctiva/sclera: Conjunctivae normal.   Cardiovascular:      Rate and Rhythm: Normal rate and regular rhythm.   Pulmonary:      Effort: Pulmonary effort is normal.      Breath sounds: Normal breath sounds. No wheezing.   Abdominal:      Palpations: Abdomen is soft.      Comments: Mild tenderness most prominent in LLQ but also present in RLQ, suprapubic area, and LUQ   Musculoskeletal:      Comments: BACK:  Lumbosacral spine area reveals no paraspinous tenderness, no mass.  Full lumbosacral range of motion is noted.  DTR's, motor strength and sensation normal.    Neurological:      Mental Status: She is alert.              Plan   LLQ and left flank pain  In some aspects the pain pattern seems musculoskeletal: worse with activity, relieved by rest.  However her back exam is benign.  In other respects the pattern seems urologic:  Described as cramping and associated with difficulty passing urine.  There may be more than one issue.  DDx includes kidney stone.  She is also at increased risk of bladder tumor because of long smoking history.  - check UA to evaluate for evidence of stone or UTI.   - referred to urology    Atrophic vaginitis  - continue topical estrogen    Osteoporosis  Not tolerating alendronate weekly.  - change to risedronate monthly  - continue vitamin D    Aneurysm of ascending aorta without rupture [facility])  Initially noted on CT[facility]PA at [facility] November 2021.  Thoracic surgery consult at [facility] March 2022 recommended follow up  scan in 6 months and echo to assess valve, but these were never done.  Plan:  - follow up with cardiology as scheduled for guidance regarding monitoring  - call to schedule the previously ordered echo to assess for aortic valve disease or bicuspid valve, and CTA to monitor interval change in size      Lung cancer screening  I have reviewed the above key points of shared decision making as outlined below, the patient's risk factors for lung cancer, and her eligibility for lung cancer screening. Smoking cessation was discussed.     An order to enroll in the Lung Cancer Screening program is placed. She will follow up after the initial LDCT to discuss the results and next steps    Tobacco use disorder  Managed to quit for a couple of months.  Currently smoking a couple of cigarettes a month.  Encouraged to stop again completely to minimize risk of full relapse and maintain forward momentum    Multiple polyps of colon  Referred for repeat colonoscopy,  which is overdue.    Opioid dependence on agonist therapy ([place])  Stable on burprenorphine    Constipation, drug induced  Reasonably well controlled with colace and miralax.  New rx written so she can get miralax or a generic other than Healthy Lax, which smells and tastes terrible    I spent a total of 46 minutes on this visit on the date of service (total time includes all activities performed on the date of service)            We discussed the patient's current medications. The patient expressed understanding and no barriers to adherence were identified.  1. The patient indicates understanding of these issues and agrees with the plan.  Brief care plan is updated and reviewed with the patient.   2. The patient is given an After Visit Summary sheet that lists all medications with directions, allergies, orders placed during this encounter, and follow-up instructions.   3. I reviewed the patient's medical information and medical history   4. I reconciled the patient's  medication list and prepared and supplied needed refills.   5. I have reviewed the past medical, family, and social history sections including the medications and allergies.    [name], MD       The following Shared Decision Making points were covered:    1. You are agreeing to enroll in an annual lung cancer screening program for as long as you remain eligible. The program that can run for years.   2. The term "low dose" is used because the estimated average whole-body effective dose from a low dose CT scan is not much more than usual background radiation.   3. Most of the nodules we find on these scans will not cause you any harm. Sometimes, after the first scan is done, other follow up scans are needed to see if the nodules are growing. The timing of the follow up scan can vary from 3 to 12 months after the initial scan and depends on what is being followed.   4. If a suspicious nodule is found, you may need follow up testing to help figure out whether it is an early lung cancer. Types of follow up test may include the following: another CT scan, a special scan called a PET scan to determine how active the nodule is and possibly a biopsy  1. Complications from a lung biopsy are influenced by the location of the nodule being biopsied, its size, and the method of biopsy. In a Medicare study of 1,744 patients who underwent a biopsy of a nodule, 19% experienced some form of complication such as bleeding or collapsed lung (pneumothorax)   2. A formal CT scan carries an exposure of 5-7 mSv, more than a screening CT scan .  5. LDCT has been shown to find  lung cancer early which is then more easily cured. One of the most important things you can do is to quit smoking if you have not already quit smoking.

## 2021-10-02 NOTE — Assessment & Plan Note (Signed)
Stable on burprenorphine

## 2021-10-02 NOTE — Assessment & Plan Note (Signed)
Not tolerating alendronate weekly.  - change to risedronate monthly  - continue vitamin D

## 2021-10-02 NOTE — Telephone Encounter (Signed)
Prior authorization request for risedronate 150 mg monthly  Clinical Indication: osteoporosis  Previous medications tried: alendronate  Diagnostic testing information: not tolerating weekly alendronate because of nausea

## 2021-10-02 NOTE — Assessment & Plan Note (Signed)
I have reviewed the above key points of shared decision making as outlined below, the patient's risk factors for lung cancer, and her eligibility for lung cancer screening. Smoking cessation was discussed.     An order to enroll in the Lung Cancer Screening program is placed. She will follow up after the initial LDCT to discuss the results and next steps

## 2021-10-02 NOTE — Assessment & Plan Note (Signed)
Reasonably well controlled with colace and miralax.  New rx written so she can get miralax or a generic other than Healthy Lax, which smells and tastes terrible

## 2021-10-02 NOTE — Assessment & Plan Note (Signed)
Managed to quit for a couple of months.  Currently smoking a couple of cigarettes a month.  Encouraged to stop again completely to minimize risk of full relapse and maintain forward momentum

## 2021-10-02 NOTE — Assessment & Plan Note (Signed)
Initially noted on CT-PA at [facility] November 2021.  Thoracic surgery consult at [facility] March 2022 recommended follow up scan in 6 months and echo to assess valve, but these were never done.  Plan:  - follow up with cardiology as scheduled for guidance regarding monitoring  - call to schedule the previously ordered echo to assess for aortic valve disease or bicuspid valve, and CTA to monitor interval change in size

## 2021-10-02 NOTE — Assessment & Plan Note (Signed)
Referred for repeat colonoscopy, which is overdue.

## 2021-10-03 ENCOUNTER — Encounter (HOSPITAL_BASED_OUTPATIENT_CLINIC_OR_DEPARTMENT_OTHER): Payer: Self-pay

## 2021-10-03 ENCOUNTER — Ambulatory Visit
Admission: RE | Admit: 2021-10-03 | Discharge: 2021-10-03 | Disposition: A | Discharge status: Home / Self Care | Payer: 59 | Source: Ambulatory Visit | Attending: Gastroenterology | Admitting: Gastroenterology

## 2021-10-03 DIAGNOSIS — K6289 Other specified diseases of anus and rectum: Secondary | ICD-10-CM

## 2021-10-03 MED ORDER — IOPAMIDOL (ISOVUE-300) INJECTION 61%
100.0000 mL | Freq: Once | INTRAVENOUS | Status: AC | PRN
  Administered 2021-10-03: 100 mL via INTRAVENOUS

## 2021-10-07 ENCOUNTER — Encounter (HOSPITAL_BASED_OUTPATIENT_CLINIC_OR_DEPARTMENT_OTHER): Payer: Self-pay | Admitting: Cardiovascular Disease

## 2021-10-07 NOTE — Telephone Encounter (Signed)
Patient has [employer]  for prescription coverage.  Member ID # [id number]      PRIOR AUTHORIZATION FOR risedronate    Prior authorization was done verbally via CoverMyMeds. The following information was provided:    DX & ICD 10: M81.0     Previous medications tried: alendronate  Labs/additional information:   Waiting for the insurance company to send via fax an approval/denial notice. It may take between 24 - 72 hours.     Key #: [id number]

## 2021-10-08 ENCOUNTER — Telehealth (HOSPITAL_BASED_OUTPATIENT_CLINIC_OR_DEPARTMENT_OTHER): Payer: Self-pay

## 2021-10-08 MED ORDER — BUPRENORPHINE HCL-NALOXONE HCL 8-2 MG SL FILM
ORAL_FILM | SUBLINGUAL | 0 refills | Status: DC
Start: 2021-10-08 — End: 2021-10-22

## 2021-10-08 NOTE — Telephone Encounter (Signed)
Spoke to patient to schedule a colonoscopy.  Procedure scheduled for 04/11/2022.  Pt reminded of need for ride home .

## 2021-10-08 NOTE — Telephone Encounter (Signed)
PA not needed.

## 2021-10-08 NOTE — Telephone Encounter (Signed)
PRIOR AUTHORIZATION FOR risedronate  has been approved      Start Date: 10/07/21  End Date: 04/27/22     PA #: [id number]   Copay: 0       The patient and their preferred pharmacy are aware of the approval.  Approval notice has been scanned into Environmental health practitioner.

## 2021-10-09 MED FILL — INCRUSE ELPT INH 62.5MCG: 30 days supply | Qty: 30 | Fill #0

## 2021-10-10 ENCOUNTER — Other Ambulatory Visit (HOSPITAL_BASED_OUTPATIENT_CLINIC_OR_DEPARTMENT_OTHER): Payer: Self-pay

## 2021-10-10 MED ORDER — SIMETHICONE 80 MG PO TABS
4.00 | ORAL_TABLET | ORAL | 0 refills | Status: AC
Start: 2021-10-10 — End: 2022-10-10

## 2021-10-10 MED ORDER — PEG 3350-KCL-NABCB-NACL-NASULF 236 G PO SOLR
4.00 L | ORAL | 0 refills | Status: AC
Start: 2021-10-10 — End: 2022-10-10

## 2021-10-10 NOTE — Telephone Encounter (Signed)
Colonoscopy scheduled by L Messias.  Standard colonoscopy prep instructions mailed, laxative sent.    Colonoscopy Instructions  Standard Preparation    7 DAYS BEFORE THE TEST        . DO NOT TAKE: Nonsteroidal anti-inflammatory medications, these include Ibuprofen, Motrin, Advil, Naproxen, Aleve, Naprosyn, Meloxicam, and many others  . You may take Tylenol (which is acetaminophen) for pain  . Some medical conditions require you to stay on Aspirin. Do not stop Aspirin until you speak with your health care provider.   3 DAYS BEFORE THE TEST       Stop eating high fiber foods such as corn, beans, seeds, nuts, whole grain breads, or fruit skins (pear, apple, etc.)    2 DAYS BEFORE THE TEST  . Have a light dinner no later than 7 PM    1 DAY BEFORE THE TEST  . Begin a clear liquid diet - a clear liquid means you can see through it. No solid food for the entire day    Clear Liquids Not Clear Liquids   Water, Gatorade, Powerade, or Pedialyte No red or purple items   Black coffee or tea (no milk, cream) No alcohol   Clear broth No milk, cream, other dairy products   Ginger ale or Sprite No noodles, rice, or vegetables in soup   Apple juice No juice with pulp   Jell-o, popsicles  No liquid you cannot see through     . Prepare the laxative: mix the laxative powder with water, then put it in the refrigerator  . Starting at 4 PM, drink one glass (8 ounces) of laxative every 30 minutes until the bottle is empty. Be sure to stay close to a bathroom once you start the prep.   . At 9 PM, take 2 gas pills (simethicone) with 1 glass (8 ounces) of clear liquid  . At 10 PM, take 2 more gas pills (simethicone) with 1 glass (8 ounces) of clear liquid    DAY OF THE COLONOSCOPY  . It is very important to finish the whole gallon. This will empty your colon to complete the test without problems.  . The morning of the test, you can take your other medications at the usual time with only a sip of water. These include blood pressure pills,  seizure medications, heart medications, thyroid medications, etc.  . Do not take pills for diabetes. If you have diabetes, please follow the colonoscopy preparation for people with diabetes handout  . Do not drink anything for 3 hours before the test

## 2021-10-17 ENCOUNTER — Encounter (HOSPITAL_BASED_OUTPATIENT_CLINIC_OR_DEPARTMENT_OTHER): Payer: Self-pay

## 2021-10-22 ENCOUNTER — Ambulatory Visit: Payer: 59 | Attending: Family Medicine | Admitting: Family Medicine

## 2021-10-22 DIAGNOSIS — F1120 Opioid dependence, uncomplicated: Secondary | ICD-10-CM | POA: Insufficient documentation

## 2021-10-22 MED ORDER — BUPRENORPHINE HCL-NALOXONE HCL 8-2 MG SL FILM
ORAL_FILM | SUBLINGUAL | 0 refills | Status: DC
Start: 2021-10-22 — End: 2021-11-05
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
## 2021-10-23 ENCOUNTER — Ambulatory Visit (HOSPITAL_BASED_OUTPATIENT_CLINIC_OR_DEPARTMENT_OTHER): Payer: 59 | Admitting: Specialist

## 2021-10-25 MED FILL — NICOTINE TD DIS 21MG/24H: 28 days supply | Qty: 28 | Fill #1

## 2021-11-05 ENCOUNTER — Ambulatory Visit (HOSPITAL_BASED_OUTPATIENT_CLINIC_OR_DEPARTMENT_OTHER): Payer: 59 | Admitting: Family Medicine

## 2021-11-05 ENCOUNTER — Other Ambulatory Visit (HOSPITAL_BASED_OUTPATIENT_CLINIC_OR_DEPARTMENT_OTHER): Payer: Self-pay | Admitting: Family Medicine

## 2021-11-05 DIAGNOSIS — F1120 Opioid dependence, uncomplicated: Secondary | ICD-10-CM

## 2021-11-05 MED ORDER — BUPRENORPHINE HCL-NALOXONE HCL 8-2 MG SL FILM
ORAL_FILM | SUBLINGUAL | 0 refills | Status: DC
Start: 2021-11-05 — End: 2021-11-19

## 2021-11-05 NOTE — Progress Notes (Signed)
Patient notified clinic unable to make it to [facility] today. Rx sent. She has beens table in recovery.    Julieta Gutting, MD

## 2021-11-06 ENCOUNTER — Other Ambulatory Visit: Payer: Self-pay

## 2021-11-06 ENCOUNTER — Other Ambulatory Visit (HOSPITAL_BASED_OUTPATIENT_CLINIC_OR_DEPARTMENT_OTHER): Payer: 59

## 2021-11-06 ENCOUNTER — Ambulatory Visit
Admission: RE | Admit: 2021-11-06 | Discharge: 2021-11-06 | Disposition: A | Discharge status: Home / Self Care | Payer: 59 | Attending: Internal Medicine | Admitting: Internal Medicine

## 2021-11-06 DIAGNOSIS — Z122 Encounter for screening for malignant neoplasm of respiratory organs: Secondary | ICD-10-CM | POA: Diagnosis present

## 2021-11-06 DIAGNOSIS — Z87891 Personal history of nicotine dependence: Secondary | ICD-10-CM | POA: Insufficient documentation

## 2021-11-08 MED FILL — INCRUSE ELPT INH 62.5MCG: 30 days supply | Qty: 30 | Fill #0

## 2021-11-08 NOTE — Progress Notes (Signed)
Please send letter (do not include details of result):      Your CT of the chest shows no nodules.  We can do this test once a year to detect lung cancer early, when it is more easily treatable.     The scan also shows the aneurysm of the aorta is unchanged in size, stable.  All good news!

## 2021-11-19 ENCOUNTER — Ambulatory Visit: Payer: 59 | Attending: Psychosomatic Medicine | Admitting: Clinical

## 2021-11-19 ENCOUNTER — Ambulatory Visit: Payer: 59 | Attending: Family Medicine | Admitting: Family Medicine

## 2021-11-19 DIAGNOSIS — F3341 Major depressive disorder, recurrent, in partial remission: Secondary | ICD-10-CM | POA: Insufficient documentation

## 2021-11-19 DIAGNOSIS — F1120 Opioid dependence, uncomplicated: Secondary | ICD-10-CM | POA: Insufficient documentation

## 2021-11-19 MED ORDER — BUPRENORPHINE HCL-NALOXONE HCL 8-2 MG SL FILM
ORAL_FILM | SUBLINGUAL | 0 refills | Status: DC
Start: 2021-11-19 — End: 2021-12-05

## 2021-11-19 MED FILL — NICOTINE TD DIS 21MG/24H: 28 days supply | Qty: 28 | Fill #1

## 2021-11-19 NOTE — Progress Notes (Signed)
Cheryl Dixon is a 66 year old female seen in follow up for opioid use disorder:    Buprenorphine dose: '20mg'$   Response, adequacy of dose: good  Relapses/close calls: none  Trigger: denies  Meetings: none currently    Social history/events:  Doing well  No major changes  Happy to check in with the group and see people    Present Medications:  buprenorphine-naloxone (SUBOXONE) 8-2 MG sublingual film, Take 2.5 films under the tongue daily., Disp: 35 Film, Rfl: 0  polyethylene glycol (GOLYTELY) 236 g suspension, Take 4,000 mLs by mouth See Admin Instructions Take as directed prior to your colonoscopy, Disp: 4000 mL, Rfl: 0  Simethicone 80 MG TABS, Take 4 tablets by mouth See Admin Instructions Take as directed prior to colonoscopy., Disp: 4 tablet, Rfl: 0  polyethylene glycol (GLYCOLAX/MIRALAX) 17 g packet, Take 1 packet by mouth in the morning., Disp: 90 packet, Rfl: 3  risedronate (ACTONEL) 150 MG tablet, Take 1 tablet by mouth every 30 (thirty) days with full glass of water on empty stomach-nothing else by mouth and do not lie down for 1/2 hour, Disp: 3 tablet, Rfl: 3  tiotropium-olodaterol (STIOLTO RESPIMAT) 2.5-2.5 MCG/ACT inhal, Inhale 2 puffs into the lungs in the morning., Disp: 1 each, Rfl: 5  EC-NAPROXEN 500 MG EC tablet, TAKE 1 TABLET BY MOUTH IN THE MORNING AND IN THE EVENING WITH MEALS, Disp: 60 tablet, Rfl: 2  albuterol HFA 108 (90 Base) MCG/ACT inhaler, INHALE 2 PUFFS INTO THE LUNGS EVERY 6 HOURS AS NEEDED FOR WHEEZING OR SHORTNESS OF BREATH, Disp: 8.5 g, Rfl: 11  cholecalciferol (VITAMIN D3) 2000 UNIT TABS tablet, Take 1 tablet by mouth in the morning., Disp: 90 tablet, Rfl: 3  INCRUSE ELLIPTA 62.5 MCG/INH inhaler, INHALE 1 PUFF BY MOUTH INTO LUNGS DAILY, Disp: 30 each, Rfl: 11  buPROPion (WELLBUTRIN SR) 200 MG 12 hr tablet, Take 1 tablet by mouth in the morning and 1 tablet before bedtime., Disp: 180 tablet, Rfl: 3  acetaminophen (TYLENOL) 500 MG tablet, Take 500 mg by mouth every 6 (six) hours as needed  for Pain, Disp: , Rfl:   ipratropium-albuterol (DUO-NEB) 0.5-2.5 (3) MG/3ML SOLN Inhalation Solution, Take 3 mLs by nebulization 4 (four) times daily., Disp: 180 mL, Rfl: 0    No current facility-administered medications on file prior to visit.      PHQ-9 TOTAL SCORE 01/12/2019 06/01/2018 03/03/2017   Doc FlowSheet Total Score - - -   Doc FlowSheet Total Score '10 6 4           '$ PHYSICAL EXAMINATION via video:   General appearance - healthy female in no distress  Neuro - nonfocal  Affect - normal  Behavior without evidence of intoxication or impairment    PMP Reviewed.  No unauthorized prescriptions since last refill.     Assessment:  Opioid Dependence  Comment: Last UDS +unprescribed benzodiazepines, consistent for buprenorphine. Well appearing and participating in group today. Continues to benefit from suboxone in harm reduction framework.   Plan:  Continue buprenorphine, reviewed criteria for tapering when patient is ready. Discussed sources of support.  Patient is counseled regarding relapse prevention, involvement in recovery groups, and potential side effects of buprenorphine.    For the patient participatingin the group via telephone and/or video technology: the patient/guardian has verbally consented toparticipate in this group therapy sessionby televisit. The patient/guardian wasencouraged to be in a private location due to personal health information being discussedand where they could not be overheard by individuals not participating in  the group therapy. Although all [place] staff/providers participating in this group therapy televisit are in a private location, all the risks of telephone and/or video technology used to conduct this group therapy session cannot be controlled by [place], (i.e. interruptions, unauthorized access/recording and technical difficulties). Each patient understands that the visit can be stopped at any time for any reason, including if the conferencing connections are inadequate.      Julieta Gutting, MD, 11/19/2021

## 2021-11-20 NOTE — Progress Notes (Signed)
[facility] (OFFICE BASED OPIOID TREATMENT)  GROUP PROGRESS NOTE     Televisit consent, as applicable:  For the patient participating in the group via telephone and/or video technology: The patient/guardian has verbally consented to participate in this group therapy session by televisit. The patient/guardian was encouraged to be in a private location due to personal health information being discussed and where they could not be overheard by individuals not participating in the group therapy. Although all [place] staff/providers participating in this group therapy televisit are in a private location, all the risks of telephone and/or video technology used to conduct this group therapy session cannot be controlled by [place], (i.e. interruptions, unauthorized access/recording and technical difficulties).  Each patient understands that the visit can be stopped at any time for any reason, including if the conferencing connections are inadequate.     GROUP NAME:  Office Based Opioid Treatment Group     LEADER(S): Julieta Gutting, MD, Cherlynn Perches, Ph.D, [name], RN      Cheryl Dixon is a 66 year old, Divorced, female, who presented for [facility] group visit.   Length of group: 60 min which included 30 min of Health Behavior Intervention and OUD Psychoeducation.     Number of participants in group today: 10    Group Topic Today: The Importance of Replacing Bad Habits in Achieving Stable Recovery. Group members discussed strategies of eliminating and replacing bad habits while achieving stable recovery. Group members reviewed strategies of adopting techniques of identifying and reducing bad habits associated with OUD, including drug-seeking behaviors, social isolation, neglecting self-care, and engaging in high-risk activities. Group members discussed strategies to reduce relapse risks and increase healthy coping mechanisms to support recovery and healthy change.     Purpose of Group:   . Education about Opioid Use Disorder (OUD) and  treatment  . Enhancing adjustment and coping with OUD  . Improving management of OUD and treatment adherence  . Supporting Recovery Process  . Increasing health promoting behaviors  . Decreasing health risk behaviors and risks of relapse  . Addressing psychological/psychosocial/behavioral barriers to preventing addiction and relapse  . Promotion of functional improvement     Group Interventions Utilized:  [place] Psychoeducation related to the psychological, behavioral, and/ or psychosocial aspects of OUD  . Motivational interviewing  . Coping skills training  . Cognitive restructuring  . Emotional awareness and management  . Operant behavior therapy and contingency management  . Stimulus control  . Mindfulness techniques training    Group Process:     . Group rules   . Review of group goals/structure  . Group discussion and sharing.    . Review of strategies to increase healthy coping mechanisms to support recovery and change.  . Review of additional relapse prevention strategies.   . Mindfulness and self-compassion techniques introduced.  . Discussion of support and strategies to promote recovery.  . Check in          Individual Patient Participation:     Level of participation in the group:    -Attentive to process  -Active participant  -Contributed constructively  -Engaged in topic  -Identified with peers    Subjective report of pt's progress: Pt reported doing well and expressed no concerns today. Pt had a good week.    Social History update: no major updates reported  Participation in meetings outside of this group: Not currently  Report on relapses and close calls: reports stable recovery;  denied relapses/close calls.   Affect seems Euthymic.  Relevant changes in mental status: No major changes reported or observed    Diagnosis (addressed by this group):     Opioid dependence on agonist therapy (HCC)  MDD (major depressive disorder), recurrent, in partial remission [facility]    Medical Necessity of Session (how  treatment is necessary to improve symptoms, functioning, or prevent worsening): treatment in necessary to enhance compliance with agonist treatment and maintain abstinence from opioids. Suboxone group participation is mandatory to continue Suboxone prescription and is necessary in order to enhance coping with Opioid Use Disorder.     Plan: Pt will continue [facility] treatment     Cherlynn Perches, Ph.D.  .

## 2021-11-21 ENCOUNTER — Ambulatory Visit (HOSPITAL_BASED_OUTPATIENT_CLINIC_OR_DEPARTMENT_OTHER): Payer: Self-pay | Admitting: Pediatrics

## 2021-11-28 ENCOUNTER — Ambulatory Visit (HOSPITAL_BASED_OUTPATIENT_CLINIC_OR_DEPARTMENT_OTHER): Payer: Self-pay | Admitting: Clinic/Center

## 2021-11-28 NOTE — Telephone Encounter (Signed)
Regarding: abdominal pain/constipation  ----- Message from Rhae Lerner sent at 11/28/2021  3:58 PM EDT -----  [name] [id number], 66 year old, female    Calls today:  Sick    What are the symptoms patient states having abdominal pain vaginal itchiness and constipation transferred t 57  How long has patient been sick? few days  What has pt. tried at home  prescribed       Person calling on behalf of patient: Patient (self)    CALL BACK NUMBER: [phone]   Best time to call back: anytime  Cell phone:   Other phone:    Patient's language of care: English    Patient does not need an interpreter.    Patient's PCP: [name], MD    Primary Care Home Site:  [facility]

## 2021-11-28 NOTE — Telephone Encounter (Signed)
Reason for Disposition  . Abdominal pain is a chronic symptom (recurrent or ongoing AND lasting > 4 weeks)    Answer Assessment - Initial Assessment Questions  66 YO female  Called to report intermittent abdominal pain and urinary symptoms X 2 years  states she experiences intermittent urine symptoms that includes decrease urine out put at times  " I notice if I take Ibuprofen my urine output is less"  Denies fever, nausea or vomiting  Last BM was today  Admits to passing her urine today without any difficulties  Experiencing mild lower quad pain     Appointment scheduled 8//24  Advise ER visit for any worsening symptoms    Protocols used: ADULT ABDOMINAL PAIN - FEMALE-A-[place]  Advised per nursing triage protocol.  Verbalized understanding and agreement with instructions and disposition.     Recommended disposition for patient:Disposition: See in Office within 2 weeks  Appointment scheduled for 11/30/22  If patient referred to UC/ED advised that they may require further follow up and testing after the visit with their primary care office.     Instructed patient to call back for any new, worsening, or worrisome symptoms or concerns any time day or night.

## 2021-11-29 ENCOUNTER — Ambulatory Visit (HOSPITAL_BASED_OUTPATIENT_CLINIC_OR_DEPARTMENT_OTHER): Payer: 59 | Admitting: Internal Medicine

## 2021-11-29 ENCOUNTER — Telehealth (HOSPITAL_BASED_OUTPATIENT_CLINIC_OR_DEPARTMENT_OTHER): Payer: Self-pay | Admitting: Internal Medicine

## 2021-11-29 NOTE — Telephone Encounter (Signed)
Cheryl Dixon [id number], 66 year old, female     Returned call,unable to [place] message on voicemail to return call.    Calls today:  Clinical Questions (NON-SICK CLINICAL QUESTIONS ONLY)    Name of person calling patient  Specific nature of request pt requesting a callback from RN regarding referral discussed with PCP and appt for today, thank you  Return phone number [phone]    Person calling on behalf of patient: Patient (self)    Patient's language of care: English    Patient does not need an interpreter.    Patient's PCP: [name], MD    Primary Care Home Site:  [facility]

## 2021-11-29 NOTE — Telephone Encounter (Signed)
Cheryl Dixon [id number], 66 year old, female    Calls today:  Clinical Questions ([place])    Name of person calling patient  Specific nature of request pt requesting a callback from RN regarding referral discussed with PCP and appt for today, thank you  Return phone number [phone]    Person calling on behalf of patient: Patient (self)    Patient's language of care: English    Patient does not need an interpreter.    Patient's PCP: [name], MD    Primary Care Home Site:  [facility]

## 2021-12-02 ENCOUNTER — Other Ambulatory Visit: Payer: Self-pay

## 2021-12-02 ENCOUNTER — Emergency Department (HOSPITAL_BASED_OUTPATIENT_CLINIC_OR_DEPARTMENT_OTHER): Payer: 59

## 2021-12-02 ENCOUNTER — Encounter (HOSPITAL_BASED_OUTPATIENT_CLINIC_OR_DEPARTMENT_OTHER): Payer: Self-pay

## 2021-12-02 ENCOUNTER — Ambulatory Visit (HOSPITAL_BASED_OUTPATIENT_CLINIC_OR_DEPARTMENT_OTHER): Payer: 59 | Admitting: Cardiovascular Disease

## 2021-12-02 ENCOUNTER — Emergency Department
Admission: EM | Admit: 2021-12-02 | Discharge: 2021-12-02 | Disposition: A | Discharge status: Home / Self Care | Payer: 59 | Attending: Student in an Organized Health Care Education/Training Program | Admitting: Student in an Organized Health Care Education/Training Program

## 2021-12-02 ENCOUNTER — Ambulatory Visit (HOSPITAL_BASED_OUTPATIENT_CLINIC_OR_DEPARTMENT_OTHER): Payer: Self-pay

## 2021-12-02 ENCOUNTER — Telehealth (HOSPITAL_BASED_OUTPATIENT_CLINIC_OR_DEPARTMENT_OTHER): Payer: Self-pay

## 2021-12-02 ENCOUNTER — Other Ambulatory Visit: Payer: Self-pay | Admitting: Obstetrics and Gynecology

## 2021-12-02 DIAGNOSIS — R1032 Left lower quadrant pain: Secondary | ICD-10-CM

## 2021-12-02 DIAGNOSIS — R1030 Lower abdominal pain, unspecified: Secondary | ICD-10-CM | POA: Diagnosis not present

## 2021-12-02 DIAGNOSIS — K5730 Diverticulosis of large intestine without perforation or abscess without bleeding: Secondary | ICD-10-CM

## 2021-12-02 DIAGNOSIS — R109 Unspecified abdominal pain: Secondary | ICD-10-CM | POA: Diagnosis present

## 2021-12-02 DIAGNOSIS — R928 Other abnormal and inconclusive findings on diagnostic imaging of breast: Secondary | ICD-10-CM

## 2021-12-02 LAB — URINALYSIS RFLX TO URINE CULT
GLUCOSE, URINE: NEGATIVE MG/DL
LEUKOCYTE ESTERASE: NEGATIVE
NITRITE, URINE: NEGATIVE
OCCULT BLOOD, URINE: NEGATIVE
PH URINE: 6 (ref 5.0–8.0)
PROTEIN, URINE: NEGATIVE MG/DL
SPECIFIC GRAVITY URINE: 1.025 (ref 1.003–1.035)

## 2021-12-02 LAB — COMPREHENSIVE METABOLIC PANEL
ALANINE AMINOTRANSFERASE: 11 U/L — ABNORMAL LOW (ref 12–45)
ALBUMIN: 4.5 g/dL (ref 3.4–5.2)
ALKALINE PHOSPHATASE: 62 U/L (ref 45–117)
ANION GAP: 12 mmol/L (ref 10–22)
ASPARTATE AMINOTRANSFERASE: 16 U/L (ref 8–34)
BILIRUBIN TOTAL: 0.6 mg/dL (ref 0.2–1.0)
BUN (UREA NITROGEN): 12 mg/dL (ref 7–18)
CALCIUM: 9.4 mg/dL (ref 8.5–10.5)
CARBON DIOXIDE: 26 mmol/L (ref 21–32)
CHLORIDE: 102 mmol/L (ref 98–107)
CREATININE: 0.9 mg/dL (ref 0.4–1.2)
ESTIMATED GLOMERULAR FILT RATE: >60 mL/min (ref >60)
Glucose Random: 94 mg/dL (ref 74–160)
POTASSIUM: 4.5 mmol/L (ref 3.5–5.1)
SODIUM: 140 mmol/L (ref 136–145)
TOTAL PROTEIN: 7 g/dL (ref 6.4–8.2)

## 2021-12-02 LAB — CBC, PLATELET & DIFFERENTIAL
ABSOLUTE BASO COUNT: 0.1 10*3/uL (ref 0.0–0.1)
ABSOLUTE EOSINOPHIL COUNT: 0.4 10*3/uL (ref 0.0–0.8)
ABSOLUTE IMM GRAN COUNT: 0.06 10*3/uL (ref 0.00–0.10)
ABSOLUTE LYMPH COUNT: 2.1 10*3/uL (ref 0.6–5.9)
ABSOLUTE MONO COUNT: 0.7 10*3/uL (ref 0.2–1.4)
ABSOLUTE NEUTROPHIL COUNT: 3.9 10*3/uL (ref 1.6–8.3)
ABSOLUTE NRBC COUNT: 0 10*3/uL (ref 0.0–0.0)
BASOPHIL %: 1.1 % (ref 0.0–1.2)
EOSINOPHIL %: 5 % (ref 0.0–7.0)
HEMATOCRIT: 42.5 % (ref 34.1–44.9)
HEMOGLOBIN: 13.2 g/dL (ref 11.2–15.7)
IMMATURE GRANULOCYTE %: 0.8 % (ref 0.0–1.0)
LYMPHOCYTE %: 29.6 % (ref 15.0–54.0)
MEAN CORP HGB CONC: 31.1 g/dL (ref 31.0–37.0)
MEAN CORPUSCULAR HGB: 27.4 pg (ref 26.0–34.0)
MEAN CORPUSCULAR VOL: 88.4 fl (ref 80.0–100.0)
MEAN PLATELET VOLUME: 10.1 fL (ref 8.7–12.5)
MONOCYTE %: 9.2 % (ref 4.0–13.0)
NEUTROPHIL %: 54.3 % (ref 40.0–75.0)
NRBC %: 0 % (ref 0.0–0.0)
PLATELET COUNT: 249 10*3/uL (ref 150–400)
RBC DISTRIBUTION WIDTH STD DEV: 44.7 fL (ref 35.1–46.3)
RED BLOOD CELL COUNT: 4.81 M/uL (ref 3.90–5.20)
WHITE BLOOD CELL COUNT: 7.2 10*3/uL (ref 4.0–11.0)

## 2021-12-02 LAB — POC URINALYSIS
BILIRUBIN, URINE: NEGATIVE
GLUCOSE,URINE: NEGATIVE
LEUKOCYTE ESTERASE: NEGATIVE
NITRITE, URINE: POSITIVE — AB
OCCULT BLOOD, URINE: NEGATIVE
PH URINE: 5.5 (ref 5.0–8.0)
SPECIFIC GRAVITY, URINE: 1.025 (ref 1.003–1.030)
UROBILINOGEN URINE: 0.2 (ref 0.2–1.0)

## 2021-12-02 LAB — LIPASE: LIPASE: 20 U/L (ref 13–60)

## 2021-12-02 MED ORDER — NORMAL SALINE FLUSH 0.9 % IV SOLN
55.0000 mL | Freq: Once | INTRAVENOUS | Status: AC
Start: 2021-12-02 — End: 2021-12-02
  Administered 2021-12-02: 55 mL via INTRAVENOUS

## 2021-12-02 MED ORDER — IOHEXOL 350 MG/ML IV SOLN
80.0000 mL | Freq: Once | INTRAVENOUS | Status: AC
Start: 2021-12-02 — End: 2021-12-02
  Administered 2021-12-02: 80 mL via INTRAVENOUS

## 2021-12-02 NOTE — Telephone Encounter (Signed)
Regarding: severe abdominal pain  ----- Message from Rhae Lerner sent at 12/02/2021  9:28 AM EDT -----  Cheryl Dixon [id number], 66 year old, female    Calls today:  Sick    What are the symptoms patient states having severe abdominal pain transferred to 3366  How long has patient been sick? \ few weeks  What has pt. tried at home prescribed      Person calling on behalf of patient: Patient (self)    CALL BACK NUMBER: [phone]   Best time to call back: anytime  Cell phone:   Other phone:    Patient's language of care: English    Patient does not need an interpreter.    Patient's PCP: [name], MD    Primary Care Home Site:  [facility]

## 2021-12-02 NOTE — Telephone Encounter (Signed)
Reason for Disposition  . Vomiting and abdomen looks much more swollen than usual    Answer Assessment - Initial Assessment Questions  Pt c.o constant, gradual LLQ, sharp radiating to left side of back 5/10 x 6 mos- napoxen taken-worse since Wednesday  Pt educated about NSAIDS and their effect on the abdomen  Generalized abd cramping at this time, "sharp pain in different parts of abd"  Eating and drinking well  +vomiting with bend over, "liquid, like water"  No fever  +nausea  Abdomen looks distended to pt  +brain fog "more so then a 66"  COVID two years ago  Interferes with normal activity,   Worsens with walking, wt bearing exercising intensifies pain  BM normal takes miralax  Pain does not wake her up at night  Pt get urgency with urination, "nothing comes out" "very little" x one year  "all my urine tests have been negative"  Started taking AZO  LMP-post [place]  Pt missed Cards appt for thoracic aortic aneurysm f/u( July-4.4)-did not know details of this appt  Pt offered an office appt this am, declined wants to go to ED  " I am going to go to the Emergency Room"  Advised per nursing triage protocol.  Verbalized understanding and agreement with instructions and disposition.     Recommended disposition for patient:go to office now, pt offered an appt for this am, declines states she will go to the ED today    If patient referred to UC/ED advised that they may require further follow up and testing after the visit with their primary care office.     Instructed patient to call back for any new, worsening, or worrisome symptoms or concerns any time day or night.    Protocols used: ADULT ABDOMINAL PAIN - FEMALE-A-[place]
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
## 2021-12-02 NOTE — ED Triage Note (Signed)
"  I have pain in lower abd. Lower left side. I see doctor roll. Its been going on for while. They keep making me take urine test and its not my urine. It gets very bad when I start my day." also c/o nausea. Onset 2 years ago  on and off.

## 2021-12-02 NOTE — Narrator Note (Signed)
Patient c/o of L lower quadrant for the past 2 months. Patient reports that it radiates now to her back R lower quadrant and R upper quadrant today. Patient reports feeling bloated. Patient has a hx of COPD and UTIs. Patient regularly

## 2021-12-02 NOTE — Telephone Encounter (Signed)
Returning patient's call,second call placed,unable to reach,left message on voicemail to return call.

## 2021-12-02 NOTE — Narrator Note (Signed)
Patient Disposition  Patient education for diagnosis, medications, activity, diet and follow-up.  Patient left ED 4:16 PM.  Patient rep received written instructions.    Interpreter to provide instructions: No    Patient belongings with patient: YES    Have all existing LDAs been addressed? Yes    Have all IV infusions been stopped? N/A    Destination: Discharged to home. Pt advised to follow up with pcp. All questions answered.

## 2021-12-02 NOTE — Discharge Instructions (Addendum)
You were seen in the emergency department for evaluation of your lower abdominal pain    You had no abnormal findings on CT    Follow-up with your regular doctors.    Your urine did not show any evidence of infection on the urinalysis

## 2021-12-02 NOTE — Telephone Encounter (Signed)
Report to Norm Parcel in [place] ED

## 2021-12-02 NOTE — Narrator Note (Signed)
AT CT

## 2021-12-03 ENCOUNTER — Encounter (HOSPITAL_BASED_OUTPATIENT_CLINIC_OR_DEPARTMENT_OTHER): Payer: Self-pay

## 2021-12-03 NOTE — Telephone Encounter (Signed)
Called and spoke with patient.Reports she made an appointment with urology after she spoke with this nurse this morning and phone number was provided.  Reports she is urinating but not as much as she should/wanst to,states she can wait 2 days to be seen by the urology.          Hi Cheryl Dixon,   Please confirm with patient - what I see is that her appointmetn is at the [facility] on august 10 - that is in two days... I highly doubt we will be able to move it up to tomorrow.      [place], 3RD FLOOR     12/05/2299:00p SINGLA,AJAY K           Please confirm she has the right information about the appointmetn day/time/location.    Also, can she urinate at all?    She can definitely call and see if they have any cancellations...    Please let me know the answers to the above questrions    Thanks  Lorky (covering for dave)

## 2021-12-03 NOTE — Telephone Encounter (Signed)
ER on 8/6 with Lt abdominal pain,swollen.  Call and spoke with patient.Reports,her Lt side abdomen pain has been getting worse,constant pain and swollen.Reports,unable to urinate much despite the urge and multiple attempts.States,she can't wait too long to be seen by urology and needs to be soon as soon as possible.Denies nausea,vomitting,fever,buring urination or any other acute distress at this time.  Advised to go to the ER if symptoms persist or worsens,voiced understanding.  Advised,I will relay the above message to her provider.

## 2021-12-03 NOTE — Telephone Encounter (Signed)
Hi Sujata,   Please confirm with patient - what I see is that her appointmetn is at the [facility] on august 10 - that is in two days... I highly doubt we will be able to move it up to tomorrow.      [place], 3RD FLOOR     12/05/2299:00p SINGLA,AJAY K           Please confirm she has the right information about the appointmetn day/time/location.    Also, can she urinate at all?    She can definitely call and see if they have any cancellations...    Please let me know the answers to the above questrions    Thanks  Cambryn Charters (covering for dave)

## 2021-12-04 ENCOUNTER — Other Ambulatory Visit (HOSPITAL_BASED_OUTPATIENT_CLINIC_OR_DEPARTMENT_OTHER): Payer: Self-pay | Admitting: Internal Medicine

## 2021-12-04 ENCOUNTER — Encounter (INDEPENDENT_AMBULATORY_CARE_PROVIDER_SITE_OTHER): Payer: Self-pay

## 2021-12-04 DIAGNOSIS — N952 Postmenopausal atrophic vaginitis: Secondary | ICD-10-CM

## 2021-12-04 MED ORDER — ESTRADIOL 0.1 MG/GM VA CREA
TOPICAL_CREAM | VAGINAL | 0 refills | Status: DC
Start: 2021-12-04 — End: 2022-02-13

## 2021-12-04 MED FILL — ESTRADIOL CRE 0.01%: 20 days supply | Qty: 43 | Fill #0

## 2021-12-04 NOTE — Telephone Encounter (Signed)
PER Pharmacy, Cheryl Dixon is a 66 year old female has requested a refill of estradiol cream.      Last Office Visit: 11-19-21 with claire paduano  Last Physical Exam: 05-27-13    There are no preventive care reminders to display for this patient.    Other Med Adult:  Most Recent BP Reading(s)  12/02/21 : 154/83        Cholesterol (mg/dL)   Date Value   10/22/2020 233     LOW DENSITY LIPOPROTEIN DIRECT (mg/dL)   Date Value   10/22/2020 158     HIGH DENSITY LIPOPROTEIN (mg/dL)   Date Value   10/22/2020 40     No results found for: TG      THYROID SCREEN TSH REFLEX FT4 (uIU/mL)   Date Value   01/12/2019 1.630         No results found for: TSH    HEMOGLOBIN A1C (%)   Date Value   01/12/2019 4.6       No results found for: POCA1C      INR (no units)   Date Value   02/16/2007 1.0 (L)   07/03/2006 < 1.0 (L)       SODIUM (mmol/L)   Date Value   12/02/2021 140       POTASSIUM (mmol/L)   Date Value   12/02/2021 4.5           CREATININE (mg/dL)   Date Value   12/02/2021 0.9       Documented patient preferred pharmacies:      [place], [place], [place] - [place]  Phone: [phone] Fax: [phone]

## 2021-12-05 ENCOUNTER — Telehealth (HOSPITAL_BASED_OUTPATIENT_CLINIC_OR_DEPARTMENT_OTHER): Payer: Self-pay | Admitting: Internal Medicine

## 2021-12-05 ENCOUNTER — Ambulatory Visit (HOSPITAL_BASED_OUTPATIENT_CLINIC_OR_DEPARTMENT_OTHER): Payer: 59

## 2021-12-05 ENCOUNTER — Other Ambulatory Visit (HOSPITAL_BASED_OUTPATIENT_CLINIC_OR_DEPARTMENT_OTHER): Payer: Self-pay | Admitting: Family Medicine

## 2021-12-05 DIAGNOSIS — F1120 Opioid dependence, uncomplicated: Secondary | ICD-10-CM

## 2021-12-05 MED ORDER — BUPRENORPHINE HCL-NALOXONE HCL 8-2 MG SL FILM
ORAL_FILM | SUBLINGUAL | 0 refills | Status: DC
Start: 2021-12-05 — End: 2021-12-17

## 2021-12-05 NOTE — Telephone Encounter (Signed)
PER Patient (self),@ is a 66 year old female has requested a refill of suboxone      Last prescribed - start date: 11/19/21 end date: 12/03/21     Last Office Visit: 11/19/21  Last Physical Exam: 05/27/13  There are no preventive care reminders to display for this patient.     Other Med Adult:  Most Recent BP Reading(s)  12/02/21 : 154/83        Cholesterol (mg/dL)   Date Value   10/22/2020 233     LOW DENSITY LIPOPROTEIN DIRECT (mg/dL)   Date Value   10/22/2020 158     HIGH DENSITY LIPOPROTEIN (mg/dL)   Date Value   10/22/2020 40     No results found for: TG      THYROID SCREEN TSH REFLEX FT4 (uIU/mL)   Date Value   01/12/2019 1.630         No results found for: TSH    HEMOGLOBIN A1C (%)   Date Value   01/12/2019 4.6       No results found for: POCA1C      INR (no units)   Date Value   02/16/2007 1.0 (L)   07/03/2006 < 1.0 (L)       SODIUM (mmol/L)   Date Value   12/02/2021 140       POTASSIUM (mmol/L)   Date Value   12/02/2021 4.5           CREATININE (mg/dL)   Date Value   12/02/2021 0.9        Documented patient preferred pharmacies:    [id number] [place] [address] Phone: [phone]: [phone]

## 2021-12-05 NOTE — Telephone Encounter (Signed)
Reason For Call: Patient called the [facility] saying she needs to either speak with nurse or PCP to discuss getting a referral. She said she's had pain going on for a long time and feels like no one can find out what's wong with her. She's not sure what kind of referral she would need. Pt would like to speak with nurses from primary care office.    Symptoms: Pt said she's been experiencing problems using the bathroom.  She said she was told she has cysts on the bladder but was told there was nothing that can be done and is afraid because her husband had a similar situation and turned out to be cancer.     Caller: Patient     Call back number: [phone]

## 2021-12-06 MED FILL — INCRUSE ELPT INH 62.5MCG: 30 days supply | Qty: 30 | Fill #0

## 2021-12-06 NOTE — Telephone Encounter (Signed)
I called the patient to offer the following  services.    I was able to reach the patient and assisted them with the needs listed below.    Please copy and paste the provider's request below:    Patient was reached-- scheduled appointment per nurse for a ED follow up

## 2021-12-09 ENCOUNTER — Ambulatory Visit: Payer: 59 | Attending: Family Medicine | Admitting: Family Medicine

## 2021-12-09 ENCOUNTER — Other Ambulatory Visit: Payer: Self-pay

## 2021-12-09 ENCOUNTER — Ambulatory Visit
Admission: RE | Admit: 2021-12-09 | Discharge: 2021-12-09 | Disposition: A | Discharge status: Home / Self Care | Payer: 59 | Source: Ambulatory Visit | Attending: Obstetrics and Gynecology | Admitting: Obstetrics and Gynecology

## 2021-12-09 VITALS — BP 115/88 | HR 75 | Temp 96.2°F | Wt 200.6 lb

## 2021-12-09 DIAGNOSIS — R1032 Left lower quadrant pain: Secondary | ICD-10-CM | POA: Diagnosis present

## 2021-12-09 DIAGNOSIS — M79675 Pain in left toe(s): Secondary | ICD-10-CM | POA: Diagnosis present

## 2021-12-09 DIAGNOSIS — G8929 Other chronic pain: Secondary | ICD-10-CM | POA: Insufficient documentation

## 2021-12-09 DIAGNOSIS — R928 Other abnormal and inconclusive findings on diagnostic imaging of breast: Secondary | ICD-10-CM

## 2021-12-09 NOTE — Patient Instructions (Addendum)
Continue applying bacitracin to left big toe and keeping it clean.  Continue taking medicine solifenacine from urology as instructed    Return in about 1-2 months for physical and to reevaluate abdominal pain

## 2021-12-09 NOTE — Progress Notes (Signed)
Ms. Cheryl Dixon is a 66 year old female presenting for ED follow up due to abdominal pain.    She presented to the ED on 12/02/21 with LLQ abdominal pain, CT abd/pelvis w/ IV contrast not concerning for acute intra-abdominal process.    Was able to see urology on December 05, 2021. Started on solifenacin 10 mg daily which does help. She notices dizziness and nausea. She was told she has a cyst. Pain has been improving since starting medication, however she gets it, particularly with activity. She does not have as much urgency and frequency. No dysuria now.  Sometimes has pain with urination, chronic, due to atrophy. Already on estradiol cream.  Pain with activity prevents her from walking more than a block.    History of endometeriosis s/p hysterectomy about 30 years ago. Has been having cramping pain. Last saw ob/gyn a little over a month ago.    No fevers, chills, coughing, vomiting, diarrhea, hematuria, vaginal discharge or bloody vaginal discharge.    Left foot big toe pain for about a week. Slight laceration, drained yellow, had a big blister, smaller now with clear and slightly blood drainage. Sore with sneaker on, still some pain when no sneaker.    Patient Active Problem List:     Opioid dependence on agonist therapy (HCC)     Tobacco use disorder     Fibrocystic breast     Elevated BP without diagnosis of hypertension     Knee pain     Chronic low back pain     OA (osteoarthritis) of knee     H. pylori infection     Major depressive disorder, recurrent episode, severe (HCC)     Occult blood in stools     Prediabetes     Epigastric pain     Chronic obstructive pulmonary disease (HCC)     Left foot pain     Multiple polyps of colon     Acquired deformity of toe, right     Dislocation of metatarsophalangeal joint of lesser toe, right, sequela     Postmenopausal atrophic vaginitis     Abnormal loss of weight     Aneurysm of ascending aorta without rupture ([place])     COVID-19 virus infection     BMI 32.0-32.9,adult      Dysuria     LLQ pain     Osteoporosis     History of endometriosis     Lung cancer screening     Constipation, drug induced    Past Medical History:  No date: Anxiety  No date: Arthritis  No date: COPD (chronic obstructive pulmonary disease) (HCC)  No date: Depression  No date: Obese  No date: Substance addiction (HCC)  No date: Varicose veins of lower extremities    BP 115/88   Pulse 75   Temp 96.2 F (35.7 C) (Temporal)   Wt 91 kg (200 lb 9.6 oz)   LMP 11/20/1991   SpO2 97%   BMI 37.90 kg/m   General: sitting in chair in no acute distress  HEENT: atraumatic, normocephalic, MMM  Heart: RRR, no murmurs  Lungs: CTAB, normal effort  Abdomen: soft, mild tenderness to palpation along RLQ to deep palpation, moderate tenderness to palpation along LLQ to deep palpation. No rebound. No CVA tenderness.  MSK: Left great toe lateral aspect small laceration with scant serosanguinous drainage, tender to palpation. Slight erythema to toe but no warmth or induration, no proximal erythema or induration.    Assessment/Plan  (  R10.32,  G89.29) Chronic LLQ pain  (primary encounter diagnosis)  Comment: Saw urology on 12/05/21 and given solifenacin which seems to be helping. At this time content with improvement and would like to continue with current treatment  Plan:    - Per patient, currently no follow up plan in place with urology and she would like to see how symptoms continue to respond to solifenacin.     - Discussed that should symptoms continue, she should follow up with urology again. If pain is not from urological etiology, may need to revisit endometriosis as etiology of pain and refer back to ob/gyn    [phone]) Great toe pain, left  Comment: Improving with bacitracin and local wound care. Slight erythema, but otherwise no concerning signs of severe infection  Plan: Continue with local wound care and topical bacitracin. Discussed wearing loose fitting shoes     Follow up in 1-2 months for physical with Dr.  Lisa Roca.    Malon Kindle, MD  Internal Medicine and Pediatrics

## 2021-12-11 ENCOUNTER — Telehealth (HOSPITAL_BASED_OUTPATIENT_CLINIC_OR_DEPARTMENT_OTHER): Payer: Self-pay | Admitting: Internal Medicine

## 2021-12-11 NOTE — Telephone Encounter (Signed)
Analei Whinery [id number], 66 year old, female    Calls today:  Clinical Questions (NON-SICK CLINICAL QUESTIONS ONLY)    Name of person calling pt.  Specific nature of request Pt schedule 09/13 with pcp, however would like to know if we can accommodate her for sooner appt either tele or est with PCP only. Please advised, thank you.  Return phone number [phone]      Patient's language of care: English    Patient does not need an interpreter.    Patient's PCP: [name], MD    Primary Care Home Site:  [facility]

## 2021-12-12 ENCOUNTER — Emergency Department (HOSPITAL_BASED_OUTPATIENT_CLINIC_OR_DEPARTMENT_OTHER): Payer: 59

## 2021-12-12 ENCOUNTER — Other Ambulatory Visit: Payer: Self-pay

## 2021-12-12 ENCOUNTER — Observation Stay
Admission: EM | Admit: 2021-12-12 | Discharge: 2021-12-13 | Disposition: A | Discharge status: Home / Self Care | Payer: 59 | Attending: Internal Medicine | Admitting: Internal Medicine

## 2021-12-12 DIAGNOSIS — I639 Cerebral infarction, unspecified: Principal | ICD-10-CM | POA: Diagnosis present

## 2021-12-12 DIAGNOSIS — I6523 Occlusion and stenosis of bilateral carotid arteries: Secondary | ICD-10-CM | POA: Diagnosis not present

## 2021-12-12 DIAGNOSIS — R2689 Other abnormalities of gait and mobility: Secondary | ICD-10-CM | POA: Diagnosis not present

## 2021-12-12 DIAGNOSIS — F1120 Opioid dependence, uncomplicated: Secondary | ICD-10-CM | POA: Diagnosis present

## 2021-12-12 DIAGNOSIS — I7121 Aneurysm of the ascending aorta, without rupture: Secondary | ICD-10-CM | POA: Diagnosis not present

## 2021-12-12 DIAGNOSIS — R2681 Unsteadiness on feet: Secondary | ICD-10-CM

## 2021-12-12 DIAGNOSIS — R030 Elevated blood-pressure reading, without diagnosis of hypertension: Secondary | ICD-10-CM | POA: Diagnosis present

## 2021-12-12 DIAGNOSIS — R531 Weakness: Secondary | ICD-10-CM | POA: Diagnosis present

## 2021-12-12 DIAGNOSIS — R29818 Other symptoms and signs involving the nervous system: Secondary | ICD-10-CM

## 2021-12-12 DIAGNOSIS — J449 Chronic obstructive pulmonary disease, unspecified: Secondary | ICD-10-CM | POA: Diagnosis present

## 2021-12-12 DIAGNOSIS — R269 Unspecified abnormalities of gait and mobility: Secondary | ICD-10-CM | POA: Diagnosis not present

## 2021-12-12 DIAGNOSIS — N3281 Overactive bladder: Secondary | ICD-10-CM | POA: Diagnosis present

## 2021-12-12 LAB — HOLD GREEN TOP TUBE

## 2021-12-12 MED ORDER — CALCIUM CARBONATE ANTACID 500 MG PO CHEW
500.00 mg | CHEWABLE_TABLET | Freq: Once | ORAL | Status: AC
Start: 2021-12-12 — End: 2021-12-12
  Administered 2021-12-12: 500 mg via ORAL
  Filled 2021-12-12: qty 1

## 2021-12-12 MED ORDER — IOHEXOL 350 MG/ML IV SOLN
75.00 mL | Freq: Once | INTRAVENOUS | Status: AC
Start: 2021-12-12 — End: 2021-12-12
  Administered 2021-12-12: 75 mL via INTRAVENOUS

## 2021-12-12 MED ORDER — NORMAL SALINE FLUSH 0.9 % IV SOLN
100.00 mL | Freq: Once | INTRAVENOUS | Status: AC
Start: 2021-12-12 — End: 2021-12-12
  Administered 2021-12-12: 100 mL via INTRAVENOUS

## 2021-12-12 MED ORDER — ASPIRIN 81 MG PO CHEW
324.00 mg | CHEWABLE_TABLET | Freq: Once | ORAL | Status: AC
Start: 2021-12-12 — End: 2021-12-13
  Administered 2021-12-13: 324 mg via ORAL
  Filled 2021-12-12: qty 4
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
## 2021-12-12 NOTE — Progress Notes (Signed)
Sooner appt scheduled

## 2021-12-12 NOTE — Narrator Note (Signed)
Pt medicated per MD order

## 2021-12-12 NOTE — ED Triage Note (Addendum)
Pt reports yesterday around 5-6pm noticed "I was not walking straight, weakness of my right side and dizziness" Weakness noted on right side in triage. Otherwise neuro system intact.  Pt also reporting loss of appetite and nausea.    Reports a fall on Monday but denies hitting head or LOC

## 2021-12-13 ENCOUNTER — Observation Stay (HOSPITAL_BASED_OUTPATIENT_CLINIC_OR_DEPARTMENT_OTHER): Payer: 59

## 2021-12-13 ENCOUNTER — Encounter (HOSPITAL_BASED_OUTPATIENT_CLINIC_OR_DEPARTMENT_OTHER): Payer: Self-pay | Admitting: Internal Medicine

## 2021-12-13 DIAGNOSIS — I6782 Cerebral ischemia: Secondary | ICD-10-CM | POA: Diagnosis not present

## 2021-12-13 DIAGNOSIS — I639 Cerebral infarction, unspecified: Principal | ICD-10-CM

## 2021-12-13 DIAGNOSIS — I6389 Other cerebral infarction: Secondary | ICD-10-CM | POA: Diagnosis not present

## 2021-12-13 DIAGNOSIS — I719 Aortic aneurysm of unspecified site, without rupture: Secondary | ICD-10-CM

## 2021-12-13 DIAGNOSIS — M2578 Osteophyte, vertebrae: Secondary | ICD-10-CM

## 2021-12-13 DIAGNOSIS — R531 Weakness: Secondary | ICD-10-CM | POA: Diagnosis not present

## 2021-12-13 DIAGNOSIS — R2681 Unsteadiness on feet: Secondary | ICD-10-CM

## 2021-12-13 DIAGNOSIS — N3281 Overactive bladder: Secondary | ICD-10-CM

## 2021-12-13 DIAGNOSIS — J449 Chronic obstructive pulmonary disease, unspecified: Secondary | ICD-10-CM | POA: Diagnosis not present

## 2021-12-13 DIAGNOSIS — F1120 Opioid dependence, uncomplicated: Secondary | ICD-10-CM

## 2021-12-13 DIAGNOSIS — M7989 Other specified soft tissue disorders: Secondary | ICD-10-CM | POA: Diagnosis not present

## 2021-12-13 DIAGNOSIS — M4802 Spinal stenosis, cervical region: Secondary | ICD-10-CM | POA: Diagnosis not present

## 2021-12-13 DIAGNOSIS — W19XXXA Unspecified fall, initial encounter: Secondary | ICD-10-CM

## 2021-12-13 DIAGNOSIS — M5011 Cervical disc disorder with radiculopathy,  high cervical region: Secondary | ICD-10-CM | POA: Diagnosis not present

## 2021-12-13 DIAGNOSIS — R600 Localized edema: Secondary | ICD-10-CM | POA: Diagnosis not present

## 2021-12-13 DIAGNOSIS — M1990 Unspecified osteoarthritis, unspecified site: Secondary | ICD-10-CM | POA: Diagnosis not present

## 2021-12-13 LAB — COMPREHENSIVE METABOLIC PANEL
ALANINE AMINOTRANSFERASE: 10 U/L — ABNORMAL LOW (ref 12–45)
ALBUMIN: 4.5 g/dL (ref 3.4–5.2)
ALKALINE PHOSPHATASE: 63 U/L (ref 45–117)
ANION GAP: 11 mmol/L (ref 10–22)
ASPARTATE AMINOTRANSFERASE: 15 U/L (ref 8–34)
BILIRUBIN TOTAL: 0.5 mg/dL (ref 0.2–1.0)
BUN (UREA NITROGEN): 14 mg/dL (ref 7–18)
CALCIUM: 9.7 mg/dL (ref 8.5–10.5)
CARBON DIOXIDE: 24 mmol/L (ref 21–32)
CHLORIDE: 105 mmol/L (ref 98–107)
CREATININE: 1 mg/dL (ref 0.4–1.2)
ESTIMATED GLOMERULAR FILT RATE: >60 mL/min (ref >60)
Glucose Random: 107 mg/dL (ref 74–160)
POTASSIUM: 4.2 mmol/L (ref 3.5–5.1)
SODIUM: 140 mmol/L (ref 136–145)
TOTAL PROTEIN: 6.9 g/dL (ref 6.4–8.2)

## 2021-12-13 LAB — LIPID PANEL
Cholesterol: 222 mg/dL (ref 0–239)
HIGH DENSITY LIPOPROTEIN: 44 mg/dL (ref 40–60)
LOW DENSITY LIPOPROTEIN DIRECT: 160 mg/dL (ref 0–189)
TRIGLYCERIDES: 119 mg/dL (ref 0–150)

## 2021-12-13 LAB — CBC, PLATELET & DIFFERENTIAL
ABSOLUTE BASO COUNT: 0.1 10*3/uL (ref 0.0–0.1)
ABSOLUTE EOSINOPHIL COUNT: 0.4 10*3/uL (ref 0.0–0.8)
ABSOLUTE IMM GRAN COUNT: 0.03 10*3/uL (ref 0.00–0.10)
ABSOLUTE LYMPH COUNT: 2.3 10*3/uL (ref 0.6–5.9)
ABSOLUTE MONO COUNT: 0.6 10*3/uL (ref 0.2–1.4)
ABSOLUTE NEUTROPHIL COUNT: 3.7 10*3/uL (ref 1.6–8.3)
ABSOLUTE NRBC COUNT: 0 10*3/uL (ref 0.0–0.0)
BASOPHIL %: 1.1 % (ref 0.0–1.2)
EOSINOPHIL %: 5.7 % (ref 0.0–7.0)
HEMATOCRIT: 42.2 % (ref 34.1–44.9)
HEMOGLOBIN: 13.3 g/dL (ref 11.2–15.7)
IMMATURE GRANULOCYTE %: 0.4 % (ref 0.0–1.0)
LYMPHOCYTE %: 32.8 % (ref 15.0–54.0)
MEAN CORP HGB CONC: 31.5 g/dL (ref 31.0–37.0)
MEAN CORPUSCULAR HGB: 27.8 pg (ref 26.0–34.0)
MEAN CORPUSCULAR VOL: 88.1 fl (ref 80.0–100.0)
MEAN PLATELET VOLUME: 9.5 fL (ref 8.7–12.5)
MONOCYTE %: 8.3 % (ref 4.0–13.0)
NEUTROPHIL %: 51.7 % (ref 40.0–75.0)
NRBC %: 0 % (ref 0.0–0.0)
PLATELET COUNT: 236 10*3/uL (ref 150–400)
RBC DISTRIBUTION WIDTH STD DEV: 43.8 fL (ref 35.1–46.3)
RED BLOOD CELL COUNT: 4.79 M/uL (ref 3.90–5.20)
WHITE BLOOD CELL COUNT: 7.1 10*3/uL (ref 4.0–11.0)

## 2021-12-13 LAB — PROTHROMBIN TIME
INR: 1.1 (ref 2.0–3.5)
PROTHROMBIN TIME: 12.2 SECONDS (ref 9.6–12.3)

## 2021-12-13 LAB — MAGNESIUM: MAGNESIUM: 2 mg/dL (ref 1.6–2.6)

## 2021-12-13 LAB — PHOSPHORUS: PHOSPHORUS: 3.5 mg/dL (ref 2.5–4.9)

## 2021-12-13 LAB — HEMOGLOBIN A1C
ESTIMATED AVERAGE GLUCOSE: 117 mg/dL (ref 74–160)
HEMOGLOBIN A1C: 5.7 % — ABNORMAL HIGH (ref 4.0–5.6)

## 2021-12-13 LAB — HOLD BLUE TOP TUBE

## 2021-12-13 LAB — COVID-19 INPATIENT: COVID-19 INPATIENT: NEGATIVE

## 2021-12-13 MED ORDER — ACETAMINOPHEN 325 MG PO TABS
650.0000 mg | ORAL_TABLET | ORAL | Status: DC | PRN
Start: 2021-12-13 — End: 2021-12-13
  Administered 2021-12-13: 650 mg via ORAL
  Filled 2021-12-13: qty 2

## 2021-12-13 MED ORDER — VITAMIN D 25 MCG (1000 UT) PO TABS
2000.0000 [IU] | ORAL_TABLET | Freq: Every day | ORAL | Status: DC
Start: 2021-12-13 — End: 2021-12-13
  Administered 2021-12-13: 2000 [IU] via ORAL
  Filled 2021-12-13 (×2): qty 2

## 2021-12-13 MED ORDER — ASPIRIN 81 MG PO CHEW
81.0000 mg | CHEWABLE_TABLET | Freq: Every day | ORAL | 0 refills | Status: DC
Start: 2021-12-14 — End: 2021-12-19

## 2021-12-13 MED ORDER — BUDESONIDE-FORMOTEROL FUMARATE 160-4.5 MCG/ACT IN AERO
2.0000 | INHALATION_SPRAY | Freq: Two times a day (BID) | RESPIRATORY_TRACT | Status: DC
Start: 2021-12-13 — End: 2021-12-13
  Administered 2021-12-13: 2 via RESPIRATORY_TRACT
  Filled 2021-12-13: qty 6

## 2021-12-13 MED ORDER — ASPIRIN 81 MG PO CHEW
81.0000 mg | CHEWABLE_TABLET | Freq: Every day | ORAL | Status: DC
Start: 2021-12-13 — End: 2021-12-13
  Administered 2021-12-13: 81 mg via ORAL
  Filled 2021-12-13: qty 1

## 2021-12-13 MED ORDER — TIOTROPIUM BROMIDE MONOHYDRATE 2.5 MCG/ACT IN AERS
2.0000 | INHALATION_SPRAY | Freq: Every day | RESPIRATORY_TRACT | Status: DC
Start: 2021-12-13 — End: 2021-12-13
  Administered 2021-12-13: 2 via RESPIRATORY_TRACT
  Filled 2021-12-13: qty 4

## 2021-12-13 MED ORDER — BUPRENORPHINE HCL-NALOXONE HCL 8-2 MG SL SUBL
12.0000 mg | SUBLINGUAL_TABLET | Freq: Every evening | SUBLINGUAL | Status: DC
Start: 2021-12-13 — End: 2021-12-13

## 2021-12-13 MED ORDER — ACETAMINOPHEN 500 MG PO TABS
1000.0000 mg | ORAL_TABLET | Freq: Once | ORAL | Status: AC
Start: 2021-12-13 — End: 2021-12-13

## 2021-12-13 MED ORDER — POLYETHYLENE GLYCOL 3350 17 G PO PACK
17.0000 g | PACK | Freq: Every day | ORAL | Status: DC
Start: 2021-12-13 — End: 2021-12-13
  Administered 2021-12-13: 17 g via ORAL
  Filled 2021-12-13: qty 1

## 2021-12-13 MED ORDER — HEPARIN SODIUM (PORCINE) 5000 UNIT/ML IJ SOLN
5000.0000 [IU] | Freq: Two times a day (BID) | INTRAMUSCULAR | Status: DC
Start: 2021-12-13 — End: 2021-12-13
  Administered 2021-12-13: 5000 [IU] via SUBCUTANEOUS
  Filled 2021-12-13: qty 1

## 2021-12-13 MED ORDER — MAGNESIUM HYDROXIDE 400 MG/5ML PO SUSP
30.0000 mL | Freq: Every day | ORAL | Status: DC | PRN
Start: 2021-12-13 — End: 2021-12-13

## 2021-12-13 MED ORDER — CLOPIDOGREL BISULFATE 75 MG PO TABS
75.0000 mg | ORAL_TABLET | Freq: Every day | ORAL | Status: DC
Start: 2021-12-13 — End: 2021-12-13
  Administered 2021-12-13: 75 mg via ORAL
  Filled 2021-12-13: qty 1

## 2021-12-13 MED ORDER — MELATONIN 3 MG PO TABS
3.00 mg | ORAL_TABLET | Freq: Once | ORAL | Status: AC
Start: 2021-12-13 — End: 2021-12-13
  Administered 2021-12-13: 3 mg via ORAL
  Filled 2021-12-13: qty 1

## 2021-12-13 MED ORDER — BUPRENORPHINE HCL-NALOXONE HCL 8-2 MG SL SUBL
8.0000 mg | SUBLINGUAL_TABLET | Freq: Every day | SUBLINGUAL | Status: DC
Start: 2021-12-13 — End: 2021-12-13
  Administered 2021-12-13: 1 via SUBLINGUAL
  Filled 2021-12-13: qty 1

## 2021-12-13 MED ORDER — CALCIUM CARBONATE ANTACID 500 MG PO CHEW
1000.0000 mg | CHEWABLE_TABLET | Freq: Three times a day (TID) | ORAL | Status: DC | PRN
Start: 2021-12-13 — End: 2021-12-13
  Administered 2021-12-13: 1000 mg via ORAL
  Filled 2021-12-13: qty 2

## 2021-12-13 MED ORDER — ACETAMINOPHEN 500 MG PO TABS
ORAL_TABLET | ORAL | Status: AC
Start: 2021-12-13 — End: 2021-12-13
  Administered 2021-12-13: 500 mg via ORAL
  Filled 2021-12-13: qty 2

## 2021-12-13 MED ORDER — ATORVASTATIN CALCIUM 40 MG PO TABS
80.0000 mg | ORAL_TABLET | Freq: Every day | ORAL | Status: DC
Start: 2021-12-13 — End: 2021-12-13
  Administered 2021-12-13: 80 mg via ORAL
  Filled 2021-12-13: qty 2

## 2021-12-13 MED ORDER — BUPROPION HCL ER (SR) 100 MG PO TB12
200.0000 mg | ORAL_TABLET | Freq: Two times a day (BID) | ORAL | Status: DC
Start: 2021-12-13 — End: 2021-12-13
  Administered 2021-12-13: 200 mg via ORAL
  Filled 2021-12-13: qty 2

## 2021-12-13 MED ORDER — CLOPIDOGREL BISULFATE 75 MG PO TABS
75.0000 mg | ORAL_TABLET | Freq: Every day | ORAL | 0 refills | Status: DC
Start: 2021-12-14 — End: 2021-12-27

## 2021-12-13 MED ORDER — ATORVASTATIN CALCIUM 80 MG PO TABS
80.0000 mg | ORAL_TABLET | Freq: Every day | ORAL | 0 refills | Status: DC
Start: 2021-12-13 — End: 2021-12-19

## 2021-12-13 MED ORDER — SENNOSIDES 8.6 MG PO TABS
17.2000 mg | ORAL_TABLET | Freq: Two times a day (BID) | ORAL | Status: DC
Start: 2021-12-13 — End: 2021-12-13
  Administered 2021-12-13: 17.2 mg via ORAL
  Filled 2021-12-13: qty 2

## 2021-12-13 MED ORDER — BUPRENORPHINE HCL-NALOXONE HCL 8-2 MG SL SUBL
8.00 mg | SUBLINGUAL_TABLET | Freq: Once | SUBLINGUAL | Status: AC
Start: 2021-12-13 — End: 2021-12-13
  Administered 2021-12-13: 1 via SUBLINGUAL
  Filled 2021-12-13: qty 1

## 2021-12-13 NOTE — Plan of Care (Signed)
Problem: Cognitive:  Goal: Caregivers and patients knowledge of risk factors and measures for prevention of condition will be supported throughout the hospitalization  Outcome: Progressing     Problem: Safety:  Goal: Will remain free from falls throughout the hospitalization,  Outcome: Progressing       Pt calm and cooperative with nursing care. On RA. On TELE 3020 NSR. Compliant with all meds. Denies dizziness. C/o of back and neck pain. Tylenol administered with fair effect. Cold pack applied with good effect.         Pt leaving AMA. MD notified. IV removed. AVS reviewed.  Escorted to the main lobby.

## 2021-12-13 NOTE — Initial Assessments (Signed)
PT consult received MRI pending for unilateral weakness, will follow after medical workup is complete

## 2021-12-13 NOTE — Progress Notes (Signed)
Pt's case discussed at MDR. Pt admitted with generalized weakness. Met pt at bedside.  She is alert and oriented x 3.  Lives alone in elevator accessible apt.  .  Uses a cane to ambulate.  Has never been to [facility], and is followed by [facility] Kathlee Nations [phone] RN is her CCM.  Dispo - Will have MRI later today and if WNL , may be discharged home with out pt follow up.Marland Kitchen

## 2021-12-13 NOTE — Event Note (Signed)
Called to bedside as patient had requested to leave urgently. I spoke to the patient and she tells me that she "just wants to be home in her bed." We discussed that she had a stroke and needs further workup and to stay in the hospital. She told me she does not care and wants to go home. We discussed that after a first stroke there is higher risk of recurrence in the next 30 days. Consequences of stroke can be permanent paralysis, impaired cognition, speech problems and death. She expresses understanding and reiterates these risks back to me and tells me she still wants to leave. Capacity to make medical decisions was never in question during this hospitalization.     I will place the discharge order and e-prescribe ASA, plavix and statin to her pharmacy. I strongly encouraged her to make appointments with her PCP and with neurology.     Madelin Rear, MD

## 2021-12-13 NOTE — Discharge Inst - Reason you came to the hospital (Addendum)
You came to the hospital for: right arm weakness.     During your hospital stay:  You were found to have an acute stroke by brain MRI. You refused to stay for full evaluation or to be seen by a neurologist.     PATIENT INSTRUCTIONS  As you transition home, please:  Take your prescribed meds   Follow up with your PCP for further evaluation of stroke    Please see your PCP within one week to discuss: your new stroke.   Reasons to call or visit your PCP (or an Emergency Room if PCP unavailable):  --Medication refills  --Severe symptoms of any kind.  FOR EMERGENCIES CALL 911    It was a pleasure to care for you in the hospital.  Madelin Rear, MD, and your [facility] Team  12/13/21

## 2021-12-13 NOTE — Discharge Summary (Signed)
Physician Discharge Summary     Patient ID:  Name Cheryl Dixon  MRN [id number]   Age 66 year old  DOB 1955-12-30    Admit Date: 12/13/21    Discharge Date: 12/13/21  Admitting Physician: Azzie Glatter, MD  Discharge Physicians Gean Laursen, Belenda Cruise, MD     Discharge Diagnoses:   Acute stroke of the left periventricular white matter.   Lacunar infarct  Right upper extremity weakness  Fall  Gait disturbance  Elevated blood pressure  Opioid dependence on maintenance therapy  COPD    Hospital Summary:  66 year old female with history of COPD (not on home O2), opioid dependence on suboxone, depression, OA, aortic aneurysm, overactive bladder (started solifenacin ~1 week PTA), and a fall 1 week PTA (with resultant right knee pain and increased gait instability), who presented with 1-2 days of new RUE weakness found to have an acute stroke of the left periventricular white matter on MRI brain. She was seen by neurology and started on DAPT and statin. Unfortunately, she was not willing to stay for further evaluation including echo with bubble study. Risks of leaving discussed extensively with patient but she preferred to direct her own discharge prior to completion of medical workup.     Issues for Follow up:   '[ ]'$  check echocardiogram for PFO and other cardiac cause of stroke  '[ ]'$  consider holter monitor for cryptogenic stroke  '[ ]'$  f/u with neurology for further evaluation of stroke  '[ ]'$  plavix for additional 20 days then stop    Relevant Past Medical History: Past Medical History:  No date: Anxiety  No date: Arthritis  No date: COPD (chronic obstructive pulmonary disease) (HCC)  No date: Depression  No date: Obese  No date: Substance addiction ([place])  No date: Varicose veins of lower extremities    Summary of the Presenting Complaint: (From H&P)  Cheryl Dixon is a 66 year old female with history of COPD (not on home O2), opioid dependence on suboxone, depression, OA, aortic aneurysm, overactive bladder (started solifenacin ~1  week PTA), and a fall 1 week PTA (with resultant right knee pain and increased gait instability), who presented with 1-2 days of new RUE weakness.     History somewhat inconsistent.   However, it appears that although pt had a fall last week (rushing on the way to her Urology appt at [facility]), she just noticed right arm/hand weakness over the past 1-2 days. Weakness of entire RUE, but most notable at hand and fingers. also noticed some swelling of right hand fingers around the same time, but not had any hand pain at all.    Does have mild pain at right neck and also at shoulder/rotator cuff, which is worse since the fall. pain radiates down right arm, but no numbness/tingling.     Since fall last week, she's had increased right knee pain and swelling, and there's effusion on exam.  On exam, her gait appears steady while using a cane. However, she does NOT use a cane at baseline (was given a cane in anticipation of possible knee replacement and states she used it a few months ago for "practice," but not used it since then, until feeling the need to use it this week).     Today, son noted possible facial droop, which was not notable by arrival to ED.   Pt herself states she's been getting facial "twitches" for weeks or months, but otherwise, face feels normal.     This week,  pt has also noticed some increased difficulty concentrating, some blurry vision, and constipation - she attributes these sx to the solifenacin, which she started 1 week ago, after Urology appt at [facility].    She chronically has intermittent dizziness, nausea, and SOB. History inconsistent, but no clear worsening this week. Her chronic lower abd pain is better since starting solifenacin. No chest pain, fever, chills.     Hospital Course (by problem):  Acute CVA  She presented with RUE weakness and gait instability, but the unsteadiness started last week, after a fall and resultant right knee pain, so the gait issue appears to be at least partially (if  not completely) MSK. However, etiology of the RUE is less clear - possibly due to injury from the fall (?compression at c-spine vs MSK injury to arm/hand itself), but this relationship is less clear, as pt just noticed it over the past 1-2 days. Initial ct scan ws negative but MRI brain showed left periventricular infarct. She was seen by neurology. She was aspirin loaded upon arrival after initial ct head. She was started on DAPT and atorvastatin 80 mg for secondary prevention. The patient would not stay for further evaluation including a full 24 hours on telemetry or echocardiogram. She will need to follow up with her pcp and neurology.     # Overactive bladder  This week, pt has also noticed some increased difficulty concentrating, some blurry vision, and constipation - possible these sx are related to the above processes, but pt she attribute these sx to the solifenacin, which is also possible. which she just started solifenacin 1 week ago, after Urology appt at [facility]. home solifenacin was held as not on formulary    # Aortic aneurysm  Known aortic aneurysm, which was maximum 4.4cm last month and was felt to be unchanged from prior CT in 2021 (also read as 4.4 cm). On CTA neck overnight, [facility] reports the aneurysm as measuring 4.8cm, but they do not comment on whether this is a change from prior.     # Opioid dependence  Home suboxone 2.5 '8mg'$  tabs daily (takes 1 tab in am and either 1 or 1.5 tabs at night)    # COPD   formulary equivalents of home inhalers    Key labs, imaging, other tests:   BMP: Recent Labs     12/12/21  2056   NA 140   K 4.2   CL 105   CO2 24   BUN 14   CREAT 1.0   GLUCOSER 107     Ca, Mg, Phos:   Recent Labs     12/12/21  2056   CA 9.7   MG 2.0   PHOS 3.5     CBC:   Recent Labs     12/12/21  2056   WBC 7.1   HGB 13.3   HCT 42.2   PLTA 236   RBC 4.79     Coagulation Labs:   Recent Labs     12/12/21  2056   PT 12.2   INR 1.1     LFTs:   Recent Labs     12/12/21  2056   AST 15   ALT 10*    TBILI 0.5   ALKPHOS 63   ALBUMIN 4.5     Pancreatic Enzymes: No results for input(s): AMY, LIP in the last 72 hours.  Urinalysis:No results for input(s): UACOL, UACLA, UAGLU, UABIL, UAKET, SPEGRAVURINE, UAOCC, UAPH, UAPRO, UANIT, LEUKOCYTES in the last 72 hours.  Invalid input(s): UAOBU  Troponin: No results for input(s): TROPI in the last 72 hours.  NT-proBNP: No results for input(s): PROBNP in the last 72 hours.  Lactic acid: No results for input(s): LACTICACID in the last 72 hours.    IMAGING:   Please see epic for full details of head imaging:     MRI Brain WO Contrast:   IMPRESSION:     1. Acute infarct in the left periventricular matter.  2. There are a few T2 hyperintense foci in the cerebral white matter bilaterally. The distribution pattern is nonspecific. White matter foci are a common finding often related to microvascular ischemic changes, particularly in older individuals.     Pending Laboratory Work and Tests:   None     Discharge Exam:  BP (!) 148/96   Pulse 69   Temp 98 F (36.7 C) (Oral)   Resp 18   Wt 90.5 kg (199 lb 9.6 oz)   LMP 11/20/1991   SpO2 100%   BMI 37.71 kg/m   Gen:  NAD, alert and interactive  HEENT: MMM, no scleral icterus, oropharynx clear w/out erythema or exudate  Neck: Supple, full ROM, mild tenderness at lower neck, R>L  CV:  Regular, no murmur  Resp: Normal WOB, CTAB, no w/r/r  GI: Soft, NT, ND, no rebound or guarding, +BS  Extremities:  WWP, no edema  MSK:  +right knee swelling (likely effusion) with no warmth or erythema. +right shoulder tenderness. +swelling of right hand fingers (particularly 2nd digit). No tenderness at lower arm or hand. No pain with either active or passive ROM.  Skin: Intact, no rash on visible skin  Neuro:  A&Ox3, CNII-XII intact, no visible facial droop; mild RUE, more prominent at hand/fingers than upper arm; difficulty with FTN on right, which appears related to difficulty moving arm; normal FTN on left, normal heel to shin b/l; 5/5  strength of LUE and b/l lower extremities, relatively steady gait while using cane (which she just started using this week), Sensation intact to light touch throughout    Discharge Medication List:  Discharge Medication List as of 12/13/2021  3:32 PM    START taking these medications    aspirin 81 MG chewable tablet  Take 1 tablet by mouth in the morning.  E-Prescribe, Disp-30 tablet, R-0    atorvastatin (LIPITOR) 80 MG tablet  Take 1 tablet by mouth Daily after dinner  E-Prescribe, Disp-30 tablet, R-0    clopidogrel (PLAVIX) 75 MG tablet  Take 1 tablet by mouth in the morning for 20 days.  E-Prescribe, Disp-20 tablet, R-0      CONTINUE these medications which have NOT CHANGED    solifenacin (VESICARE) 10 MG tablet  Take 1 tablet by mouth in the morning.  Historical    buprenorphine-naloxone (SUBOXONE) 8-2 MG sublingual film  Take 2.5 films under the tongue daily.  E-Prescribe, Disp-35 Film, R-0  Partial fill upon patient request.      estradiol (ESTRACE) 0.1 MG/GM vaginal cream  PLACE 5 G VAGINALLY 3 TIMES A WEEK MYLAN BRAND ONLY.  E-Prescribe, Disp-170 g, R-0    polyethylene glycol (GOLYTELY) 236 g suspension  Take 4,000 mLs by mouth See Admin Instructions Take as directed prior to your colonoscopy  E-Prescribe, Disp-4000 mL, R-0    Simethicone 80 MG TABS  Take 4 tablets by mouth See Admin Instructions Take as directed prior to colonoscopy.  E-Prescribe, Disp-4 tablet, R-0    polyethylene glycol (GLYCOLAX/MIRALAX) 17 g packet  Take 1 packet by  mouth in the morning.  E-Prescribe, Disp-90 packet, R-3  Do not use healthylax which causes diarrhea.  Miralax and other generics are OK      risedronate (ACTONEL) 150 MG tablet  Take 1 tablet by mouth every 30 (thirty) days with full glass of water on empty stomach-nothing else by mouth and do not lie down for 1/2 hour  E-Prescribe, Disp-3 tablet, R-3    tiotropium-olodaterol (STIOLTO RESPIMAT) 2.5-2.5 MCG/ACT inhal  Inhale 2 puffs into the lungs in the  morning.  E-Prescribe, Disp-1 each, R-5    EC-NAPROXEN 500 MG EC tablet  TAKE 1 TABLET BY MOUTH IN THE MORNING AND IN THE EVENING WITH MEALS  E-Prescribe, Disp-60 tablet, R-2    albuterol HFA 108 (90 Base) MCG/ACT inhaler  INHALE 2 PUFFS INTO THE LUNGS EVERY 6 HOURS AS NEEDED FOR WHEEZING OR SHORTNESS OF BREATH  E-Prescribe, Disp-8.5 g, R-11  OK to switch to covered brand alternative      cholecalciferol (VITAMIN D3) 2000 UNIT TABS tablet  Take 1 tablet by mouth in the morning.  E-Prescribe, Disp-90 tablet, R-3    INCRUSE ELLIPTA 62.5 MCG/INH inhaler  INHALE 1 PUFF BY MOUTH INTO LUNGS DAILY  E-Prescribe, Disp-30 each, R-11    buPROPion (WELLBUTRIN SR) 200 MG 12 hr tablet  Take 1 tablet by mouth in the morning and 1 tablet before bedtime.  E-Prescribe, Disp-180 tablet, R-3    acetaminophen (TYLENOL) 500 MG tablet  Take 500 mg by mouth every 6 (six) hours as needed for Pain  Historical    ipratropium-albuterol (DUO-NEB) 0.5-2.5 (3) MG/3ML SOLN Inhalation Solution  Take 3 mLs by nebulization 4 (four) times daily.  E-Prescribe, Disp-180 mL, R-0        Code Status at Admission: full  Code Status at Discharge: Full Code  Healthcare Proxy and Phone Number: NAJAE, FILSAIME  [phone]   Diet: cardiac   Wound Care: none needed  Discharge Destination: Home    Future appointments:   Current and Future Appointments at [facility] (90 Days) 12/13/2021 - 03/13/2022    The maximum number of appointments has been reached.      Date Visit Type Length Department Provider     12/17/2021 10:30 AM TELEVISIT GROUP PC 60 min [place] [[id number]] Julieta Gutting, MD    Patient Instructions:     This group will take place by video or in some cases by phone. You will receive an email with instructions on how to join this visit by Google Meet prior to the appointment.                12/17/2021 10:30 AM PS TELEVISIT VIDEO GROUP 90 90 min [facility] [[id number]] Cherlynn Perches, PhD    Patient Instructions:     This group will take place by video or in some cases by phone. You will receive an email with instructions on how to join this visit by Google Meet prior to the appointment.                12/26/2021  2:20 PM TELEVISIT 20 min [place] [[id number]] [name], MD    Patient Instructions:     You have a video televisit appointment with your [facility] provider within the next 7 days. You will receive a notification about this the day before your appointment.  Thirty minutes before your appointment you will receive a link  to your video visit in [place] and via text and email before your scheduled appointment time.  The link will walk you through steps to make sure you are set up to connect with your provider at the time of the visit and give you access to the video visit.  If you have trouble connecting with your provider, they will call you at the time of your visit at the phone number your clinic has on file.                 01/14/2022 10:30 AM TELEVISIT GROUP PC 60 min [place] [[id number]] Julieta Gutting, MD    Patient Instructions:     This group will take place by video or in some cases by phone. You will receive an email with instructions on how to join this visit by Google Meet prior to the appointment.                02/11/2022 10:30 AM TELEVISIT GROUP PC 60 min [place] [[id number]] Julieta Gutting, MD    Patient Instructions:     This group will take place by video or in some cases by phone. You will receive an email with instructions on how to join this visit by Google Meet prior to the appointment.                   Follow-up Information    None       To reach medical records at any time call the [facility] operator at [phone]     To reach the covering hospitalist, page [phone]  To reach the [facility] for pending results, call [phone]     On day of discharge, I  spent 30 minutes face-to-face with this patient and unit time, of which greater than 50% of that time was spent counseling/coordinating care including reviewing the chart, meeting with the treatment team, meeting with family/caretakers, patient/family education and discussing diagnosis, prognosis, instructions for management, follow up, and risk factor reduction.     Signed:  Madelin Rear, MD, 12/13/2021

## 2021-12-13 NOTE — Progress Notes (Signed)
OT Note    Per notes, pt is pending MRI and given the unclear etiology of her hand weakness OT will hold off until MRI results come in.      Cheryl Dixon, [place], Lic # [id number]

## 2021-12-13 NOTE — ED Notes (Signed)
ED Attending Physician Sign Out Note     I received sign out from: Dr. Mary Sella at 0100.    ED Course as of 12/13/21 0115   Fri Dec 13, 2021   0115 Awaiting transport to floor.    [age] yo F plan for admission for MRI, right sided weakness for one week.  Decreased grip strength in right hand CT negative.       Disposition     Diagnosis:   Weakness  Gait instability    Disposition:   Admission     Atilano Median, MD  Emergency Department Attending [place]

## 2021-12-13 NOTE — H&P (Signed)
HOSPITALIST H&P    DATE OF ADMISSION:  12/13/21    PATIENT INFO:  Cheryl Dixon, English-SPEAKING 66 year old female  ROOM/BED LOCATION:  [facility]    CC: Right-sided weakness    HPI:   Cheryl Dixon is a 66 year old female with history of COPD (not on home O2), opioid dependence on suboxone, depression, OA, aortic aneurysm, overactive bladder (started solifenacin ~1 week PTA), and a fall 1 week PTA (with resultant right knee pain and increased gait instability), who presented with 1-2 days of new RUE weakness.     History somewhat inconsistent.   However, it appears that although pt had a fall last week (rushing on the way to her Urology appt at [facility]), she just noticed right arm/hand weakness over the past 1-2 days. Weakness of entire RUE, but most notable at hand and fingers. also noticed some swelling of right hand fingers around the same time, but not had any hand pain at all.    Does have mild pain at right neck and also at shoulder/rotator cuff, which is worse since the fall. pain radiates down right arm, but no numbness/tingling.     Since fall last week, she's had increased right knee pain and swelling, and there's effusion on exam.  On exam, her gait appears steady while using a cane. However, she does NOT use a cane at baseline (was given a cane in anticipation of possible knee replacement and states she used it a few months ago for "practice," but not used it since then, until feeling the need to use it this week).     Today, son noted possible facial droop, which was not notable by arrival to [facility].   Pt herself states she's been getting facial "twitches" for weeks or months, but otherwise, face feels normal.     This week, pt has also noticed some increased difficulty concentrating, some blurry vision, and constipation - she attributes these sx to the solifenacin, which she started 1 week ago, after Urology appt at [facility].    She chronically has intermittent dizziness, nausea, and SOB. History inconsistent,  but no clear worsening this week. Her chronic lower abd pain is better since starting solifenacin. No chest pain, fever, chills.     In the [facility], vitals were BP (!) 183/102   Pulse 75   Temp 97.7 F (36.5 C)   Resp 16   Wt 95.3 kg (210 lb)   LMP 11/20/1991   SpO2 97%   BMI 39.68 kg/m .  Pt received full dose ASA, tylenol, tums.    ROS:  Pertinent positives and negatives as per HPI. All other systems reviewed and negative.    PMH:   Patient Active Problem List:     Opioid dependence on agonist therapy ([facility])     Tobacco use disorder     Fibrocystic breast     Elevated BP without diagnosis of hypertension     Knee pain     Chronic low back pain     OA (osteoarthritis) of knee     H. pylori infection     Major depressive disorder, recurrent episode, severe (HCC)     Occult blood in stools     Prediabetes     Epigastric pain     Chronic obstructive pulmonary disease (HCC)     Left foot pain     Multiple polyps of colon     Acquired deformity of toe, right     Dislocation of  metatarsophalangeal joint of lesser toe, right, sequela     Postmenopausal atrophic vaginitis     Abnormal loss of weight     Aneurysm of ascending aorta without rupture (HCC)     COVID-19 virus infection     BMI 32.0-32.9,adult     Dysuria     LLQ pain     Osteoporosis     History of endometriosis     Lung cancer screening     Constipation, drug induced      SOCIAL HISTORY:  Former tobacco use      FAMILY HISTORY:  Review of patient's family history indicates:  Problem: Heart      Relation: Father          Age of Onset: (Not Specified)          Comment: CAD, died age 40  Problem: Pulmonary      Relation: Father          Age of Onset: (Not Specified)          Comment: COPD  Problem: Alcohol/Drug Abuse      Relation: Father          Age of Onset: (Not Specified)          Comment: alcohol use disorder  Problem: Mental/Emotional Disorders      Relation: Father          Age of Onset: (Not Specified)          Comment: PTSD  Problem: Heart       Relation: Mother          Age of Onset: (Not Specified)          Comment: CAD  Problem: Heart      Relation: Brother          Age of Onset: (Not Specified)          Comment: CAD, died age 12  Problem: Cancer - Breast      Relation: Maternal Aunt          Age of Onset: (Not Specified)          Comment: age 36  Problem: Cancer - Breast      Relation: Maternal Aunt          Age of Onset: (Not Specified)          Comment: age 61    MEDICATIONS:  Prior to Admission Medications   Prescriptions Last Dose Informant Patient Reported? Taking?   EC-NAPROXEN 500 MG EC tablet   No No   Sig: TAKE 1 TABLET BY MOUTH IN THE MORNING AND IN THE EVENING WITH MEALS   INCRUSE ELLIPTA 62.5 MCG/INH inhaler   No No   Sig: INHALE 1 PUFF BY MOUTH INTO LUNGS DAILY   Simethicone 80 MG TABS   No No   Sig: Take 4 tablets by mouth See Admin Instructions Take as directed prior to colonoscopy.   acetaminophen (TYLENOL) 500 MG tablet   Yes No   Sig: Take 500 mg by mouth every 6 (six) hours as needed for Pain   albuterol HFA 108 (90 Base) MCG/ACT inhaler   No No   Sig: INHALE 2 PUFFS INTO THE LUNGS EVERY 6 HOURS AS NEEDED FOR WHEEZING OR SHORTNESS OF BREATH   buPROPion (WELLBUTRIN SR) 200 MG 12 hr tablet   No No   Sig: Take 1 tablet by mouth in the morning and 1 tablet before bedtime.   buprenorphine-naloxone (SUBOXONE) 8-2 MG sublingual film  No No   Sig: Take 2.5 films under the tongue daily.   cholecalciferol (VITAMIN D3) 2000 UNIT TABS tablet   No No   Sig: Take 1 tablet by mouth in the morning.   estradiol (ESTRACE) 0.1 MG/GM vaginal cream   No No   Sig: PLACE 5 G VAGINALLY 3 TIMES A WEEK MYLAN BRAND ONLY.   ipratropium-albuterol (DUO-NEB) 0.5-2.5 (3) MG/3ML SOLN Inhalation Solution   No No   Sig: Take 3 mLs by nebulization 4 (four) times daily.   polyethylene glycol (GLYCOLAX/MIRALAX) 17 g packet   No No   Sig: Take 1 packet by mouth in the morning.   polyethylene glycol (GOLYTELY) 236 g suspension   No No   Sig: Take 4,000 mLs by mouth See  Admin Instructions Take as directed prior to your colonoscopy   risedronate (ACTONEL) 150 MG tablet   No No   Sig: Take 1 tablet by mouth every 30 (thirty) days with full glass of water on empty stomach-nothing else by mouth and do not lie down for 1/2 hour   solifenacin (VESICARE) 10 MG tablet   Yes No   Sig: Take 1 tablet by mouth in the morning.   tiotropium-olodaterol (STIOLTO RESPIMAT) 2.5-2.5 MCG/ACT inhal   No No   Sig: Inhale 2 puffs into the lungs in the morning.      Facility-Administered Medications: None        ALLERGIES:  Review of Patient's Allergies indicates:   Darvon                     Meperidine hcl              Comment:Nausea/vomit   Paxil [paroxetine]      Rash   Zoloft [sertraline *    Rash    PHYSICAL EXAM:  BP (!) 148/106   Pulse 61   Temp 97.7 F (36.5 C)   Resp 20   Wt 90.5 kg (199 lb 9.6 oz)   LMP 11/20/1991   SpO2 96%   BMI 37.71 kg/m   Gen:  NAD, alert and interactive  HEENT: MMM, no scleral icterus, oropharynx clear w/out erythema or exudate  Neck: Supple, full ROM, mild tenderness at lower neck, R>L  CV:  Regular, no murmur  Resp: Normal WOB, CTAB, no w/r/r  GI: Soft, NT, ND, no rebound or guarding, +BS  Extremities:  WWP, no edema  MSK:  +right knee swelling (likely effusion) with no warmth or erythema. +right shoulder tenderness. +swelling of right hand fingers (particularly 2nd digit). No tenderness at lower arm or hand. No pain with either active or passive ROM.  Skin: Intact, no rash on visible skin  Neuro:  A&Ox3, CNII-XII intact, no visible facial droop; mild RUE, more prominent at hand/fingers than upper arm; difficulty with FTN on right, which appears related to difficulty moving arm; normal FTN on left, normal heel to shin b/l; 5/5 strength of LUE and b/l lower extremities, relatively steady gait while using cane (which she just started using this week), Sensation intact to light touch throughout      DATA:   I have personally reviewed the relevant labs, imaging, and  other data, which are included here and/or discussed in A/P below:    Labs:  BMP:   Recent Labs     12/12/21  2056   NA 140   K 4.2   CL 105   CO2 24   BUN 14   CREAT 1.0  GLUCOSER 107     Ca, Mg, Phos:   Recent Labs     12/12/21  2056   CA 9.7   MG 2.0   PHOS 3.5     CBC:   Recent Labs     12/12/21  2056   WBC 7.1   HCT 42.2   PLTA 236   RBC 4.79     Coagulation Labs:   Recent Labs     12/12/21  2056   PT 12.2   INR 1.1     LFTs:   Recent Labs     12/12/21  2056   AST 15   ALT 10*   TBILI 0.5   ALKPHOS 63       HEMOGLOBIN A1C (%)   Date Value   12/12/2021 5.7 (H)   01/12/2019 4.6   05/27/2013 5.9 (H)         LOW DENSITY LIPOPROTEIN DIRECT   Date Value   12/12/2021 160 mg/dL   10/22/2020 158 mg/dL   01/04/2010 101 mg/dl (H)       EKG (personally reviewed):   NSR, morphology unremarkable    Imaging (personally reviewed):  Head CT and CTA head/neck: no acute CVA or thrombosis; 4.8cm aortic aneurysm, per [facility], official read pending      ASSESSMENT & PLAN:  Cheryl Dixon is a 66 year old female with history of COPD (not on home O2), opioid dependence on suboxone, depression, OA, aortic aneurysm, overactive bladder (started solifenacin ~1 week PTA), and a fall 1 week PTA (with resultant right knee pain and increased gait instability), who presented with 1-2 days of new RUE weakness - etiology not yet clear - CVA vs cervical radiculopathy vs. more peripheral MSK injury.    # RUE weakness  # Right hand finger swelling (mostly 2nd digit)  # Fall   # Right knee pain due to exacerbation of chronic OA  # Unsteady gait  She presented with RUE weakness and gait instability, but the unsteadiness started last week, after a fall and resultant right knee pain, so the gait issue appears to be at least partially (if not completely) MSK. However, etiology of the RUE is less clear - possibly due to injury from the fall (?compression at c-spine vs MSK injury to arm/hand itself), but this relationship is less clear, as pt just noticed it  over the past 1-2 days. Thus, given risk factors, CVA is on the differential.     History quite inconsistent, but she does fairly consistently state that she just noticed the right arm/hand weakness over the past 1-2 days, and although she also noticed some right hand swelling, has not had any right hand pain, so the weakness at hand can't be attributed to pain.  Does have mild pain at right neck and also at shoulder/rotator cuff, which is worse since the fall. pain radiates down right arm, but no numbness/tingling. She feels her difficulty moving arm is due to weakness. Does not feel limited by pain or stiffness.    Since fall last week (rushing on the way to her Urology appt at [facility]), she's had increased right knee pain and swelling, and there's effusion on exam.  On exam, her gait appears steady while using a cane. However, she does NOT use a cane at baseline (was given a cane in anticipation of possible knee replacement and states she used it a few months ago for "practice," but not used it since then, until feeling the need to use  it this week).     Per overnight reads, no visible stroke on head CT, and no acute occlusions on CTA head and neck. No fracture noted on CTA neck.  - neuro checks q4 hrs  - tele  - permissive HTN for now  - f/u lipid panel, A1c  - f/u formal reads of head CT and CTA head/neck  - MRI brain  - MRI cervical spine  - ASA 81  - atorva 80  - PT consult regarding new gait instability (requiring cane)  - OT consult regarding arm/hand weakness  - right hand x-ray  - right knee x-ray  - consider Neuro and/or Ortho consults, pending ongoing clinical assessment and the above work-up    # Overactive bladder  This week, pt has also noticed some increased difficulty concentrating, some blurry vision, and constipation - possible these sx are related to the above processes, but pt she attribute these sx to the solifenacin, which is also possible. which she just started solifenacin 1 week ago, after  Urology appt at [facility].  - check bladder scan  - hold solifenacin, as not on formulary  - if no retention on bladder scan and becomes symptomatic again with urgency, can start a formulary equivalent    # Aortic aneurysm  Known aortic aneurysm, which was maximum 4.4cm last month and was felt to be unchanged from prior CT in 2021 (also read as 4.4 cm). On CTA neck overnight, [facility] reports the aneurysm as measuring 4.8cm, but they do not comment on whether this is a change from prior.   - f/u formal [facility]iology read in am  - if aneurysm has truly increased in size compared to last month, [facility]    # Opioid dependence  - cont home suboxone 2.5 '8mg'$  tabs daily (takes 1 tab in am and either 1 or 1.5 tabs at night)    # COPD  - cont formulary equivalents of home inhalers      Hospital Problems:  Principal Problem:    Weakness (12/13/2021)      VTE PPx: heparin subq  Med rec: Done with pt and PCP med list and PDMP. Did not call pharmacy   Diet: Diet - Base Diet: Modified/Restricted; Cardiac Restrictions: Heart Healthy (ALLOW CAFFEINE)   Code: Full Code  Contact/HCP: Sumpter,STACEY [phone]  PCP: [name], MD, Phone #: [phone], Fax #: [phone]  Dispo: likely home tmrw or next day, pending PT/OT evals and work-up (detailed above) of new hand weakness  Level of care: Obs    Documentation of Time: I have spent >70 minutes (including floor/unit time) for this patient encounter of which >50% were spent in patient/family counseling and care coordination.     Marita Snellen, MD  Pager [phone]

## 2021-12-13 NOTE — Consults (Addendum)
Neurology Consultation    Cheryl Dixon is a 66 year old female, referred for consultation by Dr. Madelin Rear for evaluation of stroke.     HPI: Cheryl Dixon is a 66 year old left-handed female with history of opioid dependence on agonist therapy, tobacco use disorder, elevated blood pressure, prediabetes, ascending aorta aneurysm, depression, COPD, and osteoporosis who presented to [facility] ED on 12/12/2021 for right-sided weakness and dizziness.  She reported weakness of right hand grip strength, also veering to the right when ambulating.  Her blood pressure was 183/102 in the ED.  Head CT showed no acute intracranial abnormalities.  CTA head and neck was negative for high-grade focal stenosis.  She was given aspirin 324 mg and admitted for further evaluation.  A follow-up MRI today showed a small acute infarct in the left periventricular white matter.    She does not take a daily aspirin or statin prior to the stroke.  She ambulates with a cane due to bilateral knee osteoarthritis.    Past medical history:  Patient Active Problem List:     Opioid dependence on agonist therapy ([facility])     Tobacco use disorder     Fibrocystic breast     Elevated BP without diagnosis of hypertension     Knee pain     Chronic low back pain     OA (osteoarthritis) of knee     H. pylori infection     Major depressive disorder, recurrent episode, severe (HCC)     Occult blood in stools     Prediabetes     Epigastric pain     Chronic obstructive pulmonary disease (HCC)     Left foot pain     Multiple polyps of colon     Acquired deformity of toe, right     Dislocation of metatarsophalangeal joint of lesser toe, right, sequela     Postmenopausal atrophic vaginitis     Abnormal loss of weight     Aneurysm of ascending aorta without rupture (HCC)     COVID-19 virus infection     BMI 32.0-32.9,adult     Dysuria     LLQ pain     Osteoporosis     History of endometriosis     Lung cancer screening     Constipation, drug induced      Weakness      Current Facility-Administered Medications   Medication   . heparin (porcine) injection 5,000 Units   . calcium carbonate (TUMS) chewable tablet 1,000 mg   . acetaminophen (TYLENOL) tablet 650 mg   . aspirin chewable tablet 81 mg   . atorvastatin (LIPITOR) tablet 80 mg   . buprenorphine-naloxone (SUBOXONE) 8-2 MG SL tablet 1 tablet   . buprenorphine-naloxone (SUBOXONE) 8-2 MG SL tablet 1.5 tablet   . buPROPion [facility] SR) 12 hr tablet 200 mg   . cholecalciferol (VITAMIN D3) tablet 2,000 Units   . tiotropium (SPIRIVA RESPIMAT) inhaler 2 puff   . polyethylene glycol (GLYCOLAX/MIRALAX) packet 17 g   . budesonide-formoterol (SYMBICORT) 160-4.5 MCG/ACT inhaler 2 puff   . sennosides (SENOKOT) tablet 17.2 mg   . magnesium hydroxide (MILK OF MAGNESIA) 400 MG/5ML suspension 30 mL   . clopidogrel (PLAVIX) tablet 75 mg       Review of Patient's Allergies indicates:   Darvon                     Meperidine hcl  Comment:Nausea/vomit   Paxil [paroxetine]      Rash   Zoloft [sertraline *    Rash    Review of patient's family history indicates:  Problem: Heart      Relation: Father          Age of Onset: (Not Specified)          Comment: CAD, died age 33  Problem: Pulmonary      Relation: Father          Age of Onset: (Not Specified)          Comment: COPD  Problem: Alcohol/Drug Abuse      Relation: Father          Age of Onset: (Not Specified)          Comment: alcohol use disorder  Problem: Mental/Emotional Disorders      Relation: Father          Age of Onset: (Not Specified)          Comment: PTSD  Problem: Heart      Relation: Mother          Age of Onset: (Not Specified)          Comment: CAD  Problem: Heart      Relation: Brother          Age of Onset: (Not Specified)          Comment: CAD, died age 68  Problem: Cancer - Breast      Relation: Maternal Aunt          Age of Onset: (Not Specified)          Comment: age 26  Problem: Cancer - Breast      Relation: Maternal Aunt          Age of Onset:  (Not Specified)          Comment: age 64  Mother had stroke and heart attack.    Social History:  reports that she quit smoking about 2 months ago. Her smoking use included cigarettes. She has a 32.00 pack-year smoking history. She has never used smokeless tobacco. She reports current drug use. Drug: Marijuana. She reports that she does not drink alcohol. Currently smokes 1-3 cigarettes here and there.     ROS: a review of systems was conducted and was negative for constitutional, HEENT, skin, cardiovascular, respiratory, gastrointestinal, genitourinary, musculoskeletal, neurological, psychiatric, endocrinologic, or hematologic/lymphatic symptoms except for what have been described in HPI.     PHYSICAL EXAM: BP (!) 148/96   Pulse 69   Temp 98 F (36.7 C) (Oral)   Resp 18   Wt 90.5 kg (199 lb 9.6 oz)   LMP 11/20/1991   SpO2 100%   BMI 37.71 kg/m   GEN: NAD. Interactive. Well appearing. Normal affect.    Skin: No rashes or bruising.  HEENT: NC/AT, sclera/conjunctiva WNL, MMM, no oral lesions.   Neck: Supple. No bruits.   CV:  RRR. Nl S1/S2.   Extr:  Warm and well perfused. Chronic edema left>right.   Neuro:   MENTAL STATUS: The patient was alert and oriented, and was following all commands and appropriately interactive. There was complete fluency without paraphasic errors. The concentration, attention and memory were intact.  CN II-XII: visual fields were full to confrontation. PERRL. There was no ptosis and the EOM were intact without nystagmus or saccadic breakdown. Light-touch sensation on face was normal bilaterally. There was no facial weakness. The tongue and palate were  midline with protrusion and elevation, respectively. There was no dysarthria. The hearing was grossly normal to finger rub bilaterally. Shoulder shrugs and head turns were normal.  MOTOR: The bulk and tone were normal. There was no cogwheel rigidity, bradykinesia, pronator drift, fasciculations, myoclonus or tremor. The strength was 5/5  in the left upper and lower extremities. RUE: shoulder abduction 5-, flexion and extension at the elbow 5-, grip 5-, positive pronator drift. RLE: hip flexion 5-, knee 5-, ankle dorsiflexion and plantarflexion 5.   SENSATION: Diffusely intact to light touch.    REFLEXES: The deep tendon reflexes were 2+ and symmetric at the triceps, biceps, quadriceps and gastrocnemius/soleus. The toes were downgoing bilaterally.  CEREBELLAR: The finger-to-nose movements were without dysmetria. There was no truncal ataxia.  GAIT/STANCE: The stance and gait were narrow based and steady with a cane.     LABS AND IMAGING REVIEWED:   MRI brain wo 12/13/2021:  FINDINGS:    There is motion artifact.    Brain: The brain appears normally developed, with no atrophy. A focus of restricted diffusion in the left periventricular white matter measures 0.9 x 1 cm in the axial plane. There are T2 hyperintense foci in the cerebral white matter bilaterally. There are no other parenchymal signal abnormality specifically including no evidence of mass or hemorrhage.     Ventricles, CSF Spaces: The ventricles are normal in size and position. There is no extra-axial mass or collection.     Vasculature: Major intracranial vessels have flow-voids indicating patency    Sella: Pituitary morphology is normal.    Sinuses and Mastoid air cells: There is mild mucosal thickening of the ethmoid air cells, maxillary sinuses and left frontal sinus.    Orbits: Unremarkable.    IMPRESSION:     1. Acute infarct in the left periventricular matter.  2. There are a few T2 hyperintense foci in the cerebral white matter bilaterally. The distribution pattern is nonspecific. White matter foci are a common finding often related to microvascular ischemic changes, particularly in older individuals.       CTA head and neck wo and w 12/12/2021:  FINDINGS:    Please note that the exam is somewhat limited since there was a mismatch within timing of contrast bolus.    Soft  tissues of the neck: Unremarkable. No adenopathy    Bones:    Lung apices: Unremarkable    Aorta/arch: The ascending thoracic aorta is enlarged measuring up to 4.6 cm. The right brachiocephalic artery is enlarged measuring up to 19 mm.  Common carotid arteries: There is minimal atherosclerotic plaque noted along the right common carotid artery.  External carotid arteries: Normal    Vertebral artery: The proximal portion of the vertebral arteries are poorly visualized secondary to contrast bolus timing.  Basilar artery: Normal    Posterior cerebral arteries: Normal    Internal carotid arteries: There is mild atherosclerotic calcified plaque formation along the cavernous sinus    Anterior cerebral arteries: Normal    Middle cerebral arteries: Normal    Dural sinuses: Normal    CT head: No enhancing lesions.    There is a 14 mm irregular enhancing structure along the right-sided thyroid gland consistent with a thyroid nodule. There is another 10 mm irregular structure within the right-sided thyroid gland consistent with a thyroid nodule.    IMPRESSION:       There is aneurysmal enlargement of the ascending aorta measuring up to 4.6 cm. There is also mild enlargement of the right-sided  brachiocephalic artery.    There is no significant stenosis along the right and left-sided carotid vessels.    Please note that there was decreased contrast within the arteries and evaluation of the most proximal portions of the vertebral arteries are limited.    Right-sided thyroid nodules measuring less than 15 mm with no follow-up necessary.      ASSESSMENT AND PLAN:    Cheryl Dixon is a 66 year old left-handed female with history of opioid dependence on agonist therapy, tobacco use disorder, elevated blood pressure, prediabetes, ascending aorta aneurysm, depression, COPD, knee osteoarthritis, and osteoporosis who presented to [facility] ED on 12/12/2021 for right-sided weakness and lightheadedness.  The weakness  started 2 days prior.  Her blood pressure had been high for a week.  Her blood pressure was 183/102 in the ED.  Head CT showed no acute intracranial abnormalities.  CTA head and neck was negative for high-grade focal stenosis.  A follow-up MRI today showed a small acute infarct in the left periventricular white matter.  On exam, she has mild weakness in the right upper and lower extremities 5 -.  There is a right pronator drift.    Lacunar infarct, left periventricular white matter - the etiology is suspected to be small vessel disease, though cardioembolism cannot be ruled out completely.  Recommendations:  - dual antiplatelet therapy with aspirin 81 mg and clopidogrel 75 mg for 3 weeks.  Discontinue clopidogrel after 3 weeks and continue with daily aspirin 81 mg.  - atorvastatin 80 mg at bedtime.  LDL is 160.  Goal less than 70.  - echocardiogram with bubble study.  - telemetry monitoring for paroxysmal atrial fibrillation.  If inpatient monitoring is negative, would recommend outpatient 14-day event monitor.  - optimize blood pressure and glucose.  - Smoking cessation.  - Physical therapy evaluation.  - follow up with neurology after discharge.     Thank you for allowing me to participate in the care of your patient.      ?  ?Leslie Dales, MD

## 2021-12-13 NOTE — Plan of Care (Signed)
BP (!) 148/106   Pulse 61   Temp 97.7 F (36.5 C)   Resp 20   Wt 90.5 kg (199 lb 9.6 oz)   LMP 11/20/1991   SpO2 96%   BMI 37.71 kg/m     Pt transferred from the ED around 0230. Pt is here for right sided weakness, alert and oriented X4, pleasant and cooperative with care, denies any chest pain, SOB and dizziness. Pt is on RA sating >95%, vitals stable, uses the bathroom independently using a cane, med given per order, no events, safety maintained.    Problem: Activity:  Goal: Mobility will be supported throughout the hospitalization,  Outcome: Progressing     Problem: Cognitive:  Goal: Caregivers and patients knowledge of risk factors and measures for prevention of condition will be supported throughout the hospitalization  Outcome: Progressing  Goal: Caregiver's, family's, patient's and significant others' knowledge of disease or condition will improve throughout the hospitalization,  Outcome: Progressing  Goal: Caregiver's, family's, patient's and significant others' understanding of discharge needs will improve throughout the hospitalization,  Outcome: Progressing     Problem: Safety:  Goal: Will remain free from falls throughout the hospitalization,  Outcome: Progressing  Goal: Ability to remain free from injury will improve throughout the hospitalization,  Outcome: Progressing     Problem: Activity:  Goal: Mobility will improve throughout the hospitalization,  Outcome: Progressing     Problem: Coping:  Goal: Coping ability will improve throughout the hospitalization,  Outcome: Progressing  Goal: Level of anxiety will decrease per patient report throughout the hospitalization,  Outcome: Progressing     Problem: Nutritional:  Goal: Maintenance of adequate nutrition will improve throughout the hospitalization,  Outcome: Progressing     Problem: Physical Regulation:  Goal: Complications related to the disease process, condition or treatment will be avoided or minimized throughout the  hospitalization,  Outcome: Progressing     Problem: Respiratory:  Goal: Will regain and/or maintain adequate ventilation throughout the hospitalization,  Outcome: Progressing     Problem: Role Relationship:  Goal: Ability to communicate needs accurately will improve throughout the hospitalization,  Outcome: Progressing  Goal: Ability to reevaluate and adapt role responsibilities will improve throughout the hospitalization,  Outcome: Progressing     Problem: Self-Care:  Goal: Ability to participate in self-care as condition permits will improve throughout the hospitalization,  Outcome: Progressing     Problem: Sensory:  Goal: Pain level will decrease per patient report throughout the hospitalization,  Outcome: Progressing     Problem: Skin Integrity:  Goal: Risk for impaired skin integrity will decrease throughout the hospitalization,  Outcome: Progressing     Problem: Tissue Perfusion:  Goal: Cerebral tissue perfusion will improve throughout the hospitalization,  Outcome: Progressing     Problem: Tobacco/Nicotine Abuse  Goal: Risk control - tobacco abuse  Description: Actions to eliminate or reduce tobacco use.  Outcome: Progressing     Problem: Safety:  Goal: Will remain free from falls throughout the hospitalization,  Outcome: Progressing     Problem: Self-Care:  Goal: Ability to participate in self-care as condition permits will improve throughout the hospitalization,  Outcome: Progressing

## 2021-12-13 NOTE — Progress Notes (Signed)
Called floor to request pt for echo.  PT refused test

## 2021-12-13 NOTE — Initial Assessments (Signed)
PT consult MRI complete awaiting neuro evaluation prior to initiation of PT eval per rehabilitation protocol

## 2021-12-16 ENCOUNTER — Ambulatory Visit (HOSPITAL_BASED_OUTPATIENT_CLINIC_OR_DEPARTMENT_OTHER): Payer: Self-pay | Admitting: Ambulatory Care

## 2021-12-16 ENCOUNTER — Encounter (HOSPITAL_BASED_OUTPATIENT_CLINIC_OR_DEPARTMENT_OTHER): Payer: Self-pay

## 2021-12-16 MED FILL — NICOTINE TD DIS 21MG/24H: 28 days supply | Qty: 28 | Fill #1

## 2021-12-16 NOTE — Telephone Encounter (Signed)
Regarding: CYST on bladder, hurts when walks , had stroke last week  ----- Message from [name] sent at 12/16/2021 10:09 AM EDT -----  [name] [phone], 66 year old, female    Calls today:  Sick    What are the symptoms CYST on bladder, hurts when walks , had stroke last week- patient wants to speak to nurse or pcp regarding her pain       Patient's language of care: English    Patient does not need an interpreter.    Patient's PCP: [name], MD    Primary Care Home Site:  [facility]

## 2021-12-16 NOTE — Telephone Encounter (Signed)
[facility] admit on 8/17 with weakness/gait unstability  Called and spoke with patient.Reports doing much better,denies chest pain/SOB,palpitaion,weakness or any acute distress.Reports the stroke symptoms came out of nowhere.  Advised to go to the nearest Er with the above worsening symptoms,voiced understanding.      Marland KitchenHas appointment scheduled with PCP on 8/24.

## 2021-12-16 NOTE — Telephone Encounter (Signed)
Follow up call placed to the patient    Name and DOB verified    Patient would like follow up with PCP regarding recent office appt with urolgy-->LLQ pain -->cyst in the bladder    Pt was sent to seen urology    She was seen last week and "dx with a cyst on the bladder"    She is happy with the new medication vesicare-rx'd by urology      She was admitted on 8/17x1 night-signed out AMA and went to [place] the following day-not admitted to [facility] but d/c home in stable condition  Diagnosis CVA  Symptoms post stroke: decrease strength to the right hand, altered smile  "I did not have any pain"  "I wasn't walking straight and thought it was the vesicare that urology rx'd me last week"    She was rx'd three new meds on d/c: asa, plavix and lipitor. She has not picked up these new meds but will do so today    Advised per nursing triage protocol.  Verbalized understanding and agreement with instructions and disposition.     Recommended disposition for patient:Disposition: See in Office within 3 days      Scheduled face to face follow up with Dr Lisa Roca as pt ONLY wants to see PCP      If patient referred to UC/ED advised that they may require further follow up and testing after the visit with their primary care office.     Instructed patient to call back for any new, worsening, or worrisome symptoms or concerns any time day or night.

## 2021-12-17 ENCOUNTER — Other Ambulatory Visit (HOSPITAL_BASED_OUTPATIENT_CLINIC_OR_DEPARTMENT_OTHER): Payer: Self-pay | Admitting: Family Medicine

## 2021-12-17 ENCOUNTER — Ambulatory Visit (HOSPITAL_BASED_OUTPATIENT_CLINIC_OR_DEPARTMENT_OTHER): Payer: 59 | Admitting: Family Medicine

## 2021-12-17 ENCOUNTER — Ambulatory Visit (HOSPITAL_BASED_OUTPATIENT_CLINIC_OR_DEPARTMENT_OTHER): Payer: 59 | Admitting: Clinical

## 2021-12-17 DIAGNOSIS — F1120 Opioid dependence, uncomplicated: Secondary | ICD-10-CM

## 2021-12-17 MED ORDER — BUPRENORPHINE HCL-NALOXONE HCL 8-2 MG SL FILM
ORAL_FILM | SUBLINGUAL | 0 refills | Status: DC
Start: 2021-12-17 — End: 2021-12-31

## 2021-12-17 NOTE — Progress Notes (Signed)
No show to group today  Will send rx if no show to next group shorten script duration    Julieta Gutting, MD

## 2021-12-18 ENCOUNTER — Encounter: Payer: Self-pay | Admitting: Physical Medicine and Rehabilitation

## 2021-12-19 ENCOUNTER — Ambulatory Visit: Payer: 59 | Attending: Internal Medicine | Admitting: Internal Medicine

## 2021-12-19 ENCOUNTER — Other Ambulatory Visit: Payer: Self-pay

## 2021-12-19 ENCOUNTER — Encounter (HOSPITAL_BASED_OUTPATIENT_CLINIC_OR_DEPARTMENT_OTHER): Payer: Self-pay | Admitting: Internal Medicine

## 2021-12-19 VITALS — BP 111/87 | HR 69 | Temp 96.9°F | Ht 61.0 in | Wt 198.4 lb

## 2021-12-19 DIAGNOSIS — R1032 Left lower quadrant pain: Secondary | ICD-10-CM | POA: Insufficient documentation

## 2021-12-19 DIAGNOSIS — L03039 Cellulitis of unspecified toe: Secondary | ICD-10-CM | POA: Diagnosis not present

## 2021-12-19 DIAGNOSIS — K5903 Drug induced constipation: Secondary | ICD-10-CM | POA: Insufficient documentation

## 2021-12-19 DIAGNOSIS — L600 Ingrowing nail: Secondary | ICD-10-CM | POA: Diagnosis present

## 2021-12-19 DIAGNOSIS — I639 Cerebral infarction, unspecified: Secondary | ICD-10-CM | POA: Insufficient documentation

## 2021-12-19 DIAGNOSIS — F1120 Opioid dependence, uncomplicated: Secondary | ICD-10-CM | POA: Insufficient documentation

## 2021-12-19 DIAGNOSIS — I7121 Aneurysm of the ascending aorta, without rupture: Secondary | ICD-10-CM | POA: Diagnosis not present

## 2021-12-19 LAB — OPIATES URINE: OPIATES URINE: NEGATIVE ng/mL

## 2021-12-19 LAB — CANNABINOIDS URINE: CANNABINOIDS URINE: POSITIVE ng/mL — AB

## 2021-12-19 LAB — FENTANYL URINE: FENTANYL URINE: NEGATIVE ng/mL

## 2021-12-19 LAB — OXYCODONE SCREEN URINE: OXYCOD SCRN URINE: NEGATIVE ng/mL

## 2021-12-19 LAB — BUPRENORPHINE SCREEN URINE: BUPRENORPHINE SCREEN URINE: POSITIVE ng/mL — AB

## 2021-12-19 LAB — AMPHETAMINES URINE: AMPHETAMINES URINE: NEGATIVE ng/mL

## 2021-12-19 LAB — METHADONE URINE: METHADONE URINE: NEGATIVE ng/mL

## 2021-12-19 LAB — BENZODIAZEPINES URINE: BENZODIAZEPINES URINE: POSITIVE ng/mL — AB

## 2021-12-19 LAB — SPECIMEN VALIDITY URINE
SPEC VALIDITY SPECIFIC GRAVITY: 1.02 (ref 1.003–1.035)
SPECIMEN VALIDITY URINE CREAT: 265 mg/dL (ref >20)
SPECIMEN VALIDITY URINE PH: 5.8 (ref 5.0–8.5)

## 2021-12-19 LAB — COCAINE METABOLITES URINE: COCAINE METABOLITES URINE: NEGATIVE ng/mL

## 2021-12-19 MED ORDER — ATORVASTATIN CALCIUM 80 MG PO TABS
80.0000 mg | ORAL_TABLET | Freq: Every day | ORAL | 3 refills | Status: DC
Start: 2021-12-19 — End: 2022-05-28

## 2021-12-19 MED ORDER — MINERAL OIL PR ENEM
1.00 | ENEMA | Freq: Once | RECTAL | 0 refills | Status: AC
Start: 2021-12-19 — End: 2021-12-19

## 2021-12-19 MED ORDER — ASPIRIN 81 MG PO CHEW
81.0000 mg | CHEWABLE_TABLET | Freq: Every day | ORAL | 3 refills | Status: DC
Start: 2021-12-19 — End: 2022-03-25

## 2021-12-19 MED FILL — ESTRADIOL CRE 0.01%: 80 days supply | Qty: 172 | Fill #1

## 2021-12-19 NOTE — Assessment & Plan Note (Signed)
-   check UDS 

## 2021-12-19 NOTE — Assessment & Plan Note (Signed)
Radiates to back.  Recent CT showed no pathology.  Suspect musculoskeletal or related to constipation.  - reassess after bowels are moving again

## 2021-12-19 NOTE — Progress Notes (Signed)
Subjective     Cheryl Dixon is a 66 year old female with COPD, opioid use disorder in remission, depression seen in follow up to recent ED visit for CVA:     She presented to [facility] ED 8/18 with 1-2 days of right arm weakness, walking funny, son noticed speech was abnormal.  MRI showed acute stroke of left periventricular white matter.  She was seen by neurology and started on ASA, Plavix, and statin but left before completing workup.    She went to the [facility] ED 8/19 where she was seen by another neurologist who recommended the same things.  She also had CTA which showed no significant stenosis but did show an incidental 1.7 cm thyroid nodule and redemonstrated her aneurysm of the ascending aorta at 4.4 cm.  Since she has been home, strength in the right hand is getting better but is not back to normal.  Taking ASA 81, clopidogrel 75, atorvastatin 80.    Also on abx for infection in toe - cephalexin.  Prescribed by [facility]    Constipation  Taking miralax, colace    Left flank pain  Exacerbated by moving around.    Neck pain  CT showed severe facet arthritis.  MRI showed mulitlevel arthritic disease and neural foraminal stenoses, without significant spinal stenosis (mild at C3/4)    Urinary urgency  Started solneficin with good response  Still having some LLQ pain radiating to the back.      Social History    Tobacco Use      Smoking status: Former        Packs/day: 1.00        Years: 32.00        Pack years: 32        Types: Cigarettes        Quit date: 09/26/2021        Years since quitting: 0.2      Smokeless tobacco: Never      Tobacco comments: quit 1980-1996, then light until 2003, then 1 ppd since    Alcohol use: No    Drug use: Yes      Types: Marijuana      Comment: past opioids, nasal heroin, 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  MJ 1x per week    Patient Active Problem List:     Opioid dependence on agonist therapy (HCC)     Tobacco use disorder     Fibrocystic breast     Elevated BP without diagnosis of hypertension      Knee pain     Chronic low back pain     OA (osteoarthritis) of knee     H. pylori infection     Major depressive disorder, recurrent episode, severe (HCC)     Occult blood in stools     Prediabetes     Epigastric pain     Chronic obstructive pulmonary disease (HCC)     Left foot pain     Multiple polyps of colon     Acquired deformity of toe, right     Dislocation of metatarsophalangeal joint of lesser toe, right, sequela     Postmenopausal atrophic vaginitis     Abnormal loss of weight     Aneurysm of ascending aorta without rupture ([place])     COVID-19 virus infection     BMI 32.0-32.9,adult     Dysuria     LLQ pain     Osteoporosis     History of endometriosis  Lung cancer screening     Constipation, drug induced     Weakness     Cerebrovascular accident (CVA) (HCC)    buprenorphine-naloxone (SUBOXONE) 8-2 MG sublingual film, Take 2.5 films under the tongue daily., Disp: 35 Film, Rfl: 0  aspirin 81 MG chewable tablet, Take 1 tablet by mouth in the morning., Disp: 30 tablet, Rfl: 0  atorvastatin (LIPITOR) 80 MG tablet, Take 1 tablet by mouth Daily after dinner, Disp: 30 tablet, Rfl: 0  clopidogrel (PLAVIX) 75 MG tablet, Take 1 tablet by mouth in the morning for 20 days., Disp: 20 tablet, Rfl: 0  solifenacin (VESICARE) 10 MG tablet, Take 1 tablet by mouth in the morning., Disp: , Rfl:   estradiol (ESTRACE) 0.1 MG/GM vaginal cream, PLACE 5 G VAGINALLY 3 TIMES A WEEK MYLAN BRAND ONLY., Disp: 170 g, Rfl: 0  polyethylene glycol (GOLYTELY) 236 g suspension, Take 4,000 mLs by mouth See Admin Instructions Take as directed prior to your colonoscopy, Disp: 4000 mL, Rfl: 0  Simethicone 80 MG TABS, Take 4 tablets by mouth See Admin Instructions Take as directed prior to colonoscopy., Disp: 4 tablet, Rfl: 0  polyethylene glycol (GLYCOLAX/MIRALAX) 17 g packet, Take 1 packet by mouth in the morning., Disp: 90 packet, Rfl: 3  risedronate (ACTONEL) 150 MG tablet, Take 1 tablet by mouth every 30 (thirty) days with full glass of  water on empty stomach-nothing else by mouth and do not lie down for 1/2 hour, Disp: 3 tablet, Rfl: 3  tiotropium-olodaterol (STIOLTO RESPIMAT) 2.5-2.5 MCG/ACT inhal, Inhale 2 puffs into the lungs in the morning., Disp: 1 each, Rfl: 5  EC-NAPROXEN 500 MG EC tablet, TAKE 1 TABLET BY MOUTH IN THE MORNING AND IN THE EVENING WITH MEALS, Disp: 60 tablet, Rfl: 2  albuterol HFA 108 (90 Base) MCG/ACT inhaler, INHALE 2 PUFFS INTO THE LUNGS EVERY 6 HOURS AS NEEDED FOR WHEEZING OR SHORTNESS OF BREATH, Disp: 8.5 g, Rfl: 11  cholecalciferol (VITAMIN D3) 2000 UNIT TABS tablet, Take 1 tablet by mouth in the morning., Disp: 90 tablet, Rfl: 3  INCRUSE ELLIPTA 62.5 MCG/INH inhaler, INHALE 1 PUFF BY MOUTH INTO LUNGS DAILY, Disp: 30 each, Rfl: 11  buPROPion (WELLBUTRIN SR) 200 MG 12 hr tablet, Take 1 tablet by mouth in the morning and 1 tablet before bedtime., Disp: 180 tablet, Rfl: 3  acetaminophen (TYLENOL) 500 MG tablet, Take 500 mg by mouth every 6 (six) hours as needed for Pain, Disp: , Rfl:   ipratropium-albuterol (DUO-NEB) 0.5-2.5 (3) MG/3ML SOLN Inhalation Solution, Take 3 mLs by nebulization 4 (four) times daily., Disp: 180 mL, Rfl: 0    No current facility-administered medications for this visit.    Review of Patient's Allergies indicates:   Darvon                     Meperidine hcl              Comment:Nausea/vomit   Paxil [paroxetine]      Rash   Zoloft [sertraline *    Rash            Objective     BP 111/87   Pulse 69   Temp 96.9 F (36.1 C) (Temporal)   Ht '5\' 1"'$  (1.549 m)   Wt 90 kg (198 lb 6.6 oz)   LMP 11/20/1991   SpO2 95%   BMI 37.49 kg/m     Physical Exam  Constitutional:       General: She is not in acute  distress.  HENT:      Head: Normocephalic and atraumatic.   Eyes:      Conjunctiva/sclera: Conjunctivae normal.   Pulmonary:      Effort: Pulmonary effort is normal.   Musculoskeletal:      Comments: Left great toe nail ingrown with paronychia. No drainage or surrounding erythema   Neurological:       Mental Status: She is alert and oriented to person, place, and time.      Cranial Nerves: No cranial nerve deficit.      Sensory: No sensory deficit.      Gait: Gait normal.      Deep Tendon Reflexes: Reflexes normal.      Comments: Diminished extension of the right wrist and fingers of the right hand.  Slow rapid alternative movements of the right hand.   Neurologic exam otherwise normal.              Plan   Cerebrovascular accident (CVA) ([place])  Presented with right arm weakness, confusion, slurred speech, and diplopia.  MRI showed acute infarct in the left periventricular white matter.  Speech and vision symptoms rapidly resolved.  At 1 week, only right arm and hand weakness is still present.  CTA at [facility] showed no significant vascular disease  Plan:  - plavix x 21 days  - asa 81 mg forever  - atorvastatin 80 forever  - echo with bubble study  - holter monitor  - referral to neuro  - follow up in 4-6 weeks    Constipation, drug induced  - enema  - Increase miralax to tid for a few days  - add lots of water    LLQ pain  Radiates to back.  Recent CT showed no pathology.  Suspect musculoskeletal or related to constipation.  - reassess after bowels are moving again      Opioid dependence on agonist therapy ([place])  - check UDS    I spent a total of 45 minutes on this visit on the date of service (total time includes all activities performed on the date of service)    We discussed the patient's current medications. The patient expressed understanding and no barriers to adherence were identified.  1. The patient indicates understanding of these issues and agrees with the plan.  Brief care plan is updated and reviewed with the patient.   2. The patient is given an After Visit Summary sheet that lists all medications with directions, allergies, orders placed during this encounter, and follow-up instructions.   3. I reviewed the patient's medical information and medical history   4. I reconciled the patient's medication list  and prepared and supplied needed refills.   5. I have reviewed the past medical, family, and social history sections including the medications and allergies.    [name], MD

## 2021-12-19 NOTE — Assessment & Plan Note (Signed)
-   enema  - Increase miralax to tid for a few days  - add lots of water

## 2021-12-19 NOTE — Progress Notes (Signed)
Please send letter with test results and this comment:    Urine test is positive for benzodiazepines.  Otherwise the result is as expected.

## 2021-12-19 NOTE — Assessment & Plan Note (Addendum)
Presented with right arm weakness, confusion, slurred speech, and diplopia.  MRI showed acute infarct in the left periventricular white matter.  Speech and vision symptoms rapidly resolved.  At 1 week, only right arm and hand weakness is still present.  CTA at [facility] showed no significant vascular disease  Plan:  - plavix x 21 days  - asa 81 mg forever  - atorvastatin 80 forever  - echo with bubble study  - holter monitor  - referral to neuro  - follow up in 4-6 weeks

## 2021-12-26 ENCOUNTER — Ambulatory Visit (HOSPITAL_BASED_OUTPATIENT_CLINIC_OR_DEPARTMENT_OTHER): Payer: Self-pay | Admitting: Clinic/Center

## 2021-12-26 ENCOUNTER — Ambulatory Visit (HOSPITAL_BASED_OUTPATIENT_CLINIC_OR_DEPARTMENT_OTHER): Payer: 59 | Admitting: Internal Medicine

## 2021-12-26 DIAGNOSIS — R109 Unspecified abdominal pain: Secondary | ICD-10-CM

## 2021-12-26 DIAGNOSIS — R10A2 Flank pain, left side: Secondary | ICD-10-CM

## 2021-12-26 DIAGNOSIS — N898 Other specified noninflammatory disorders of vagina: Secondary | ICD-10-CM

## 2021-12-26 DIAGNOSIS — R1032 Left lower quadrant pain: Secondary | ICD-10-CM

## 2021-12-26 NOTE — Telephone Encounter (Signed)
Regarding: lower left side pain  ----- Message from [facility] sent at 12/26/2021 11:29 AM EDT -----  [name] [id number], 66 year old, female    Calls today:  Sick    What are the symptoms pain on left lower side of body/hurts when she walks, had stroke 2 weeks ago - patient wants to speak to nurse or pcp regarding her pain that's she's had for 2 yrs now  How long has patient been sick? N/a  What has pt. tried at home n/a      Person calling on behalf of patient: Patient (self)    CALL BACK NUMBER: [phone]  Best time to call back: today  Cell phone:   Other phone:    Patient's language of care: English    Patient does not need an interpreter.    Patient's PCP: [name], MD    Primary Care Home Site:  [facility]

## 2021-12-26 NOTE — Telephone Encounter (Signed)
Reason for Disposition   Abdominal pain is a chronic symptom (recurrent or ongoing AND lasting > 4 weeks)    Answer Assessment - Initial Assessment Questions  66 YO female  Chief complaint" Lower left abdominal pain  x years  " I was seen by the doctor last week'  " Had negative urine test"  " The doctor cannot figure out what going on"  " Patient states she is frustrated"  " This pain is interfering in my daily routine"  " Please send Dr. Lisa Roca a message to let him know,I still have the pain. I am not sure who he should refer me to but this is not normal"    RN gave the patient time to vent her feelings    Protocols used: Abdominal Pain - Female-A-[place]  Advised per nursing triage protocol.  Verbalized understanding and agreement with instructions and disposition.     Recommended disposition for patient:Disposition: See in Office within 2 weeks Message sent to PCP per the patient's request    If patient referred to UC/ED advised that they may require further follow up and testing after the visit with their primary care office.     Instructed patient to call back for any new, worsening, or worrisome symptoms or concerns any time day or night.

## 2021-12-27 ENCOUNTER — Ambulatory Visit: Payer: 59 | Attending: Neurology | Admitting: Neurology

## 2021-12-27 ENCOUNTER — Other Ambulatory Visit: Payer: Self-pay

## 2021-12-27 ENCOUNTER — Encounter (HOSPITAL_BASED_OUTPATIENT_CLINIC_OR_DEPARTMENT_OTHER): Payer: Self-pay | Admitting: Neurology

## 2021-12-27 VITALS — BP 138/72 | HR 69 | Temp 98.0°F | Wt 195.0 lb

## 2021-12-27 DIAGNOSIS — I7121 Aneurysm of the ascending aorta, without rupture: Secondary | ICD-10-CM | POA: Diagnosis present

## 2021-12-27 DIAGNOSIS — F1120 Opioid dependence, uncomplicated: Secondary | ICD-10-CM | POA: Diagnosis not present

## 2021-12-27 DIAGNOSIS — I6330 Cerebral infarction due to thrombosis of unspecified cerebral artery: Secondary | ICD-10-CM | POA: Diagnosis not present

## 2021-12-27 DIAGNOSIS — F332 Major depressive disorder, recurrent severe without psychotic features: Secondary | ICD-10-CM | POA: Insufficient documentation

## 2021-12-27 DIAGNOSIS — J449 Chronic obstructive pulmonary disease, unspecified: Secondary | ICD-10-CM | POA: Diagnosis present
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
## 2021-12-27 NOTE — Patient Instructions (Signed)
Small vessel thrombotic stroke in the left coronal radiata

## 2021-12-27 NOTE — Progress Notes (Signed)
Patient feels physically safe at home.

## 2021-12-30 MED FILL — ESTRADIOL CRE 0.01%: 20 days supply | Qty: 43 | Fill #1

## 2021-12-31 ENCOUNTER — Other Ambulatory Visit (HOSPITAL_BASED_OUTPATIENT_CLINIC_OR_DEPARTMENT_OTHER): Payer: Self-pay | Admitting: Family Medicine

## 2021-12-31 ENCOUNTER — Ambulatory Visit (HOSPITAL_BASED_OUTPATIENT_CLINIC_OR_DEPARTMENT_OTHER): Payer: 59 | Admitting: Family Medicine

## 2021-12-31 ENCOUNTER — Ambulatory Visit (HOSPITAL_BASED_OUTPATIENT_CLINIC_OR_DEPARTMENT_OTHER): Payer: 59 | Admitting: Clinical

## 2021-12-31 DIAGNOSIS — F1120 Opioid dependence, uncomplicated: Secondary | ICD-10-CM

## 2021-12-31 MED ORDER — BUPRENORPHINE HCL-NALOXONE HCL 8-2 MG SL FILM
ORAL_FILM | SUBLINGUAL | 0 refills | Status: DC
Start: 2021-12-31 — End: 2022-01-10

## 2021-12-31 NOTE — Telephone Encounter (Signed)
Chart reviewed  The patient has tele visit appointment scheduled for today 12/31/21

## 2021-12-31 NOTE — Progress Notes (Signed)
No show to grouptoday  UDS 12/19/2021 consistent for buprenorphine also +benzodiazepines  Discussed with team today  Pt recently had CVA and has been following up with PCP and Neurology  PCP plans to reach out re: benzodiazepines  Rx sent   Will shorten script duration if no show to 01/14/22 group.    Julieta Gutting, MD

## 2022-01-02 ENCOUNTER — Ambulatory Visit (HOSPITAL_BASED_OUTPATIENT_CLINIC_OR_DEPARTMENT_OTHER): Payer: Self-pay

## 2022-01-02 NOTE — Telephone Encounter (Signed)
Regarding: PAIN IN SIDE OF STOMACH  ----- Message from Helaine Chess sent at 01/02/2022 10:20 AM EDT -----  Cheryl Dixon [id number], 66 year old, female    Calls today:  Sick    What are the symptoms PAIN IN SIDE - PT HAD RECENT STROKE AND IS HAVING ALLOT OF PAIN IN SIDE OF STOMACH          Patient's language of care: English    Patient does not need an interpreter.    Patient's PCP: [name], MD    Primary Care Home Site:  [facility]

## 2022-01-02 NOTE — Telephone Encounter (Signed)
Vevelyn Pat, RN, 01/02/2022  Reason for Disposition   Abdominal pain is a chronic symptom (recurrent or ongoing AND lasting > 4 weeks)    Answer Assessment - Initial Assessment Questions  .    Answer Assessment - Initial Assessment Questions  Patient calling with chronic left sided pain  Patient stated she has had several exams and referrals to specialists to investigate left sided abd pain  Patient voicing frustration over chronic pain  Requesting message be sent to Dr. London Pepper with triage nurse last week for same concern  Patient states "there is no bottom line as to why I still have this pain"  Offered appt at clinic but declined, wants to have appt with someone who knows her  Will forward message to PCP for review and recommendations    Protocols used: Abdominal Pain - Female-P-[place], Abdominal Pain - Female-A-[place]

## 2022-01-03 MED FILL — INCRUSE ELPT INH 62.5MCG: 30 days supply | Qty: 30 | Fill #0

## 2022-01-03 NOTE — Telephone Encounter (Addendum)
I called patient to get more information   Relevant history reviewed.  I called to get more information about the current symptoms.    Left VM that I'll call back Monday.    LLQ pain radiating to the back.  12/02/21 Seen in ED, reporting that pain has been present intermittently for the last few years, with an exacerbation starting about 4 days prior to visit.    Exam: tender LLQ.  Labs unremarkable.  CT showed scattered sigmoid diverticula but not diverticulitis, and a moderate amount of stool in the colon.  12/05/21 saw urology at [facility] for urinary frequency and urgency.  Treated with solnefecin for overactive bladder.  12/09/21 saw Dr. Wynn Banker and noted that pain was improving since starting this med, but she still has pain with activity.  Tender RLQ.  No specific therapy since it was getting better.  12/12/21 admitted for a stroke.  12/19/21 saw me for hospital follow up for the stroke.  At the time was complaining of constipation and left flank pain radiating to the back, worse with movement.   Some notes indicate she is concerned about endometriosis.    Relevant PMH/PSH  - Last colonoscopy 12/06/20, removal of 7 polyps, mostly tubular adenomas.  The large cecal lesion (18 mm) and a small polyp in the ascending colon were sessile serrated adenomas.    Repeat colonoscopy was recommended in 3-6 months.  She had a flareup of this pain after the colonoscopy.  - Endometriosis s/p lysis of adhesions  - S/p total hysterectomy  - S/p cholecystectomy and appendectomy    Currently she is moving her bowels every day.  Taking 3 colace every day.  Taking PEG every day.  Medication for bladder (solneficin) helped with urinary urgency and cramps, but did not help with pain.  Eating doesn't the affect the pain at all.  No better, no worse.  Standing is better than sitting or lying down.  Has a mass in the vagina.    LLQ pain  The fact that eating and bowel movements do not make the pain better or worse suggests that this is not a  gastroenterological problem.  She does have some chronic constipation but reports she is now moving her bowels about every day.  Kidney stone can cause pain in this area but none was visualized on recent scan.  She has no more pelvic organs.  She notes there is a vaginal mass which she did not report.    - tizanidine for abdominal wall spasm/pain  - follow up for colonoscopy as planned  - referred back to gynecology for exam of vaginal mass  - follow up for echo and holter to complete stroke workup.  - continue solnefecin for overactive bladder.    30 minutes spent in review of prior labs, imaging, colonoscopy and history.

## 2022-01-08 ENCOUNTER — Telehealth (HOSPITAL_BASED_OUTPATIENT_CLINIC_OR_DEPARTMENT_OTHER): Payer: Self-pay

## 2022-01-08 ENCOUNTER — Telehealth (HOSPITAL_BASED_OUTPATIENT_CLINIC_OR_DEPARTMENT_OTHER): Payer: Self-pay | Admitting: Internal Medicine

## 2022-01-08 ENCOUNTER — Ambulatory Visit (HOSPITAL_BASED_OUTPATIENT_CLINIC_OR_DEPARTMENT_OTHER): Payer: 59 | Admitting: Internal Medicine

## 2022-01-08 DIAGNOSIS — Z1231 Encounter for screening mammogram for malignant neoplasm of breast: Secondary | ICD-10-CM

## 2022-01-08 DIAGNOSIS — G8929 Other chronic pain: Secondary | ICD-10-CM

## 2022-01-08 NOTE — Telephone Encounter (Signed)
I ordered mammogram

## 2022-01-08 NOTE — Telephone Encounter (Signed)
.  REFERRAL REQUEST- PROVIDER, PLEASE REVIEW AND SIGN ORDER IF APPROPRIATE      HOW IS REFERRAL BEING REQUESTED: phone    WHO IS REFERRAL BEING REQUESTED BY: Patient    REFERRED TO SPECIALTY: Orthopedic    DIAGNOSIS/CHIEF COMPLAINT: right knee pain    HAVE YOU SEEN YOUR PCP FOR THIS ISSUE: yes    HAVE YOU SEEN YOUR PCP WITHIN THE LAST YEAR: yes      [facility] ONLY      PROVIDER: Dr Kyung Bacca    DOS: 10.13.23    NPI:     NUMBER OF VISITS:  6    LOCATION: [facility]    PHONE: [phone]    FAX:

## 2022-01-08 NOTE — Telephone Encounter (Signed)
Message to PCP for review and order if appropriate  Please advise

## 2022-01-08 NOTE — Telephone Encounter (Signed)
Cheryl Dixon [id number], 66 year old, female    Calls today:  Clinical Questions ([place])    Name of person calling   Specific nature of request  request mammo at [facility] hospital, please advise   Return phone number [phone]      Person calling on behalf of patient: Patient (self)    Rod Can NUMBER: [phone]    Patient's language of care: English    Patient does not need an interpreter.    Patient's PCP: [name], MD    Primary Care Home Site:  [facility]

## 2022-01-09 ENCOUNTER — Ambulatory Visit (HOSPITAL_BASED_OUTPATIENT_CLINIC_OR_DEPARTMENT_OTHER): Payer: Self-pay

## 2022-01-09 MED ORDER — TIZANIDINE HCL 2 MG PO TABS
2.00 mg | ORAL_TABLET | Freq: Three times a day (TID) | ORAL | 1 refills | Status: AC | PRN
Start: 2022-01-09 — End: 2022-02-08

## 2022-01-09 NOTE — Telephone Encounter (Signed)
I spoke to patient. See other encounter.

## 2022-01-09 NOTE — Telephone Encounter (Signed)
Reason for Disposition   Abdominal pain is a chronic symptom (recurrent or ongoing AND lasting > 4 weeks)    Answer Assessment - Initial Assessment Questions  Pt complains of chronic lower left abd pain. She has seen OB/GYN and nothing found.  Did not have a UTI.  She states she has a mass.  Colonoscopy scheduled for December.  She is wondering what she needs to do because she had some more urgent medical problems that were dealt with instead.  She also was told she has osteoporosis which "freaked me out".    Protocols used: Abdominal Pain - Female-A-OH    Advised per nursing triage protocol.  Verbalized understanding and agreement with instructions and disposition.     Recommended disposition for patient:Disposition: See in Office within 2 weeks  Pt refuses.  Wants PCP to decide next step for her.    If patient referred to UC/ED advised that they may require further follow up and testing after the visit with their primary care office.     Instructed patient to call back for any new, worsening, or worrisome symptoms or concerns any time day or night.

## 2022-01-09 NOTE — Addendum Note (Signed)
Addended by: Adaleen Hulgan on: 01/09/2022 03:46 PM     Modules accepted: Orders

## 2022-01-09 NOTE — Telephone Encounter (Signed)
Regarding: body pain  ----- Message from [name] sent at 01/09/2022  9:23 AM EDT -----  Cheryl Dixon [id number], 66 year old, female    Calls today:  Sick    What are the symptoms body pain- wants to speak to pcp or nurse     Other phone:    Patient's language of care: English    Patient does not need an interpreter.    Patient's PCP: [name], MD    Primary Care Home Site:  [facility]

## 2022-01-09 NOTE — Assessment & Plan Note (Signed)
LLQ pain  The fact that eating and bowel movements do not make the pain better or worse suggests that this is not a gastroenterological problem.  She does have some chronic constipation but reports she is now moving her bowels about every day.  Kidney stone can cause pain in this area but none was visualized on recent scan.  She has no more pelvic organs.  She notes there is a vaginal mass which she did not report.    - tizanidine for abdominal wall spasm/pain  - follow up for colonoscopy as planned  - referred back to gynecology for exam of vaginal mass  - follow up for echo and holter to complete stroke workup.  - continue solnefecin for overactive bladder.

## 2022-01-09 NOTE — Telephone Encounter (Signed)
Patient identification verified by 2 forms.    Called placed to patient relayed message below.   Provided number to Radiology [phone].  Patient verbalized understanding and agreed with plan.   Denied any further questions or concerns at this time.

## 2022-01-10 ENCOUNTER — Other Ambulatory Visit (HOSPITAL_BASED_OUTPATIENT_CLINIC_OR_DEPARTMENT_OTHER): Payer: Self-pay | Admitting: Family Medicine

## 2022-01-10 DIAGNOSIS — F1120 Opioid dependence, uncomplicated: Secondary | ICD-10-CM

## 2022-01-10 MED ORDER — BUPRENORPHINE HCL-NALOXONE HCL 8-2 MG SL FILM
ORAL_FILM | SUBLINGUAL | 0 refills | Status: DC
Start: 2022-01-14 — End: 2022-01-28

## 2022-01-14 ENCOUNTER — Telehealth (HOSPITAL_BASED_OUTPATIENT_CLINIC_OR_DEPARTMENT_OTHER): Payer: 59 | Admitting: Clinical

## 2022-01-14 ENCOUNTER — Ambulatory Visit (HOSPITAL_BASED_OUTPATIENT_CLINIC_OR_DEPARTMENT_OTHER): Payer: 59 | Admitting: Family Medicine

## 2022-01-16 NOTE — Vaccine Adverse Event Notification (Signed)
DEFAULT BEHAVIOR: Do not send an automated VAERS message.PROVIDER DETERMINATION: I disagree with the default behavior. Send this VAERS message .  This message was generated by the VAERS automatic detection system.  Your patient, Cheryl Dixon, may have experienced an adverse reaction to a recent vaccination. PLEASE NOTE: This is a preliminary assessment, and is presumed false. If you do nothing, the default behavior will be followed and a VAERS report will NOT be sent to the CDC.  If you think this was an immunization reaction and want to trigger a VAERS report, please click Edit/Sign, F2 to select: Disagree with default behavior, and sign the transcription.Cheryl Dixon, was recently noted to have:     (1) a diagnosis of Unsteadiness on feet on 2021-12-12  Cheryl Dixon was vaccinated with:     (1) PNEUMOCOCCAL 20-(PREVNAR 20) on 2021-11-20    (2) TDAP on 2021-11-20  This note was automatically generated. The VAERS auto-detection project is a Soil scientist of [place] for Disease [employer] and Prevention, and [place]. The project is funded by [employer] for [employer] and Prevention. If you have clinical questions about an adverse event, please contact the CDC/FDA's Vaccine Adverse Event Reporting System helpline at 5017549052 email: info'@[email or web address] .org. If you have questions about this project please contact the [facility] Physician liaison Dr Lindaann Pascal

## 2022-01-17 ENCOUNTER — Other Ambulatory Visit (HOSPITAL_BASED_OUTPATIENT_CLINIC_OR_DEPARTMENT_OTHER): Payer: Self-pay | Admitting: Internal Medicine

## 2022-01-17 NOTE — Telephone Encounter (Signed)
PER Pharmacy, Cheryl Dixon is a 66 year old female has requested a refill of  buPROPion (WELLBUTRIN SR) 200 MG 12 hr tablet       Last Office Visit: 12/27/2021 with Verita Lamb  Last Physical Exam: 05/27/2013    There are no preventive care reminders to display for this patient.    Other Med Adult:  Most Recent BP Reading(s)  12/27/21 : 138/72        Cholesterol (mg/dL)   Date Value   12/12/2021 222     LOW DENSITY LIPOPROTEIN DIRECT (mg/dL)   Date Value   12/12/2021 160     HIGH DENSITY LIPOPROTEIN (mg/dL)   Date Value   12/12/2021 44     TRIGLYCERIDES (mg/dL)   Date Value   12/12/2021 119         THYROID SCREEN TSH REFLEX FT4 (uIU/mL)   Date Value   01/12/2019 1.630         No results found for: TSH    HEMOGLOBIN A1C (%)   Date Value   12/12/2021 5.7 (H)       No results found for: POCA1C      INR (no units)   Date Value   12/12/2021 1.1   02/16/2007 1.0 (L)   07/03/2006 < 1.0 (L)       SODIUM (mmol/L)   Date Value   12/12/2021 140       POTASSIUM (mmol/L)   Date Value   12/12/2021 4.2           CREATININE (mg/dL)   Date Value   12/12/2021 1.0       Documented patient preferred pharmacies:    [id number] [place] [address] Phone: [phone]: [phone]

## 2022-01-21 ENCOUNTER — Telehealth (HOSPITAL_BASED_OUTPATIENT_CLINIC_OR_DEPARTMENT_OTHER): Payer: Self-pay | Admitting: Internal Medicine

## 2022-01-21 NOTE — Telephone Encounter (Signed)
Cheryl Dixon [id number], 66 year old, female    Calls today:  Clinical Questions ([place])    Name of person calling patients proxy - Erline Levine   Specific nature of request needs to speak to nurse or pcp regarding patients up coming appts and referrals that patient needs states patient is confused after stroke and doesn't understand what's happening please advise   Return phone number [phone]    Person calling on behalf of patient: Daughter      Patient's language of care: English    Patient does not need an interpreter.    Patient's PCP: [name], MD    Primary Care Home Site:  [facility]

## 2022-01-22 ENCOUNTER — Telehealth: Payer: Self-pay | Admitting: Physical Medicine and Rehabilitation

## 2022-01-22 NOTE — Telephone Encounter (Signed)
Patient called needing to schedule an appointment with Dr. Ernestina Patches for her back. The number to contact patient is [phone]

## 2022-01-22 NOTE — Telephone Encounter (Signed)
Rosemond Lyttle [id number], 66 year old, female    Returned call to daughter Erline Levine who appeared upset on the phone demanding this nurse to write an order for ultra sound before her appt(referring to [facility] GYN apt on 10/25 for vaginal mass).Demanding about different things, such as getting PCA services for her home,DR Roll to call her to discuss about her mom's situation because she believes she is not taken care of.  Difficult to redirect and ask any questions/details,regarding her request, states'this is your problem,you ask questions and don't listen to [place] and we don't get what we ask'.Further questions were not asked to deescalate the situation.     OBGYN phone number  were provided to discuss further regarding 10/25 visit.  Advised,she may contact the [facility] service for PCA service.  Advised,I will relay this message to Dr Lisa Roca.    Calls today:  Clinical Questions (NON-SICK CLINICAL QUESTIONS ONLY)    Name of person calling patients proxy - Erline Levine   Specific nature of request needs to speak to nurse or pcp regarding patients up coming appts and referrals that patient needs states patient is confused after stroke and doesn't understand what's happening please advise   Return phone number [phone]    Person calling on behalf of patient: Daughter      Patient's language of care: English    Patient does not need an interpreter.    Patient's PCP: [name], MD    Primary Care Home Site:  [facility]

## 2022-01-24 ENCOUNTER — Encounter: Payer: Self-pay | Admitting: Physical Medicine and Rehabilitation

## 2022-01-24 ENCOUNTER — Ambulatory Visit: Payer: 59 | Admitting: Physical Medicine and Rehabilitation

## 2022-01-24 VITALS — BP 141/86 | HR 96 | Ht <= 58 in | Wt 191.0 lb

## 2022-01-24 DIAGNOSIS — M47816 Spondylosis without myelopathy or radiculopathy, lumbar region: Secondary | ICD-10-CM

## 2022-01-24 DIAGNOSIS — G8929 Other chronic pain: Secondary | ICD-10-CM

## 2022-01-24 DIAGNOSIS — M545 Low back pain, unspecified: Secondary | ICD-10-CM | POA: Diagnosis not present

## 2022-01-24 NOTE — Progress Notes (Unsigned)
Patient was previously going to get an injection, but was having a more significant problem, diagnosed with pudendal neuralgia and had nerve block with no results. She has also had a botox injection in her pelvic floor which did not help. She is having back pain, right> left. She is unable to bend down, reach up without pain. The pain radiates down both legs, mostly to the thighs, but occasionally down to the feet. Has a pelvic floor therapist and is also seeing chiropractor.  She is currently taking toradol, robaxin, gabapentin, and amitriptyline.

## 2022-01-24 NOTE — Progress Notes (Unsigned)
Michelle Roberts - 66 y.o. female MRN [id number]  Date of birth: 04-26-1956  Office Visit Note: Visit Date: 01/24/2022 PCP: Bryson Corona, NP Referred by: Bryson Corona, NP  Subjective: Chief Complaint  Patient presents with   Lower Back - Pain   HPI: Michelle Roberts is a 66 y.o. female who comes in today for evaluation of chronic, worsening and severe bilateral lower back pain. States intermittent tingling sensation to legs, occurs once every week. Pain ongoing for several months and is exacerbated by standing and severe when moving from sitting to standing position. She describes pain as sore and aching, currently rates as 8 out of 10. Some relief of pain with home exercise regimen, rest and use of medications.  Patient has attended formal physical therapy in the past at [facility] where she did undergo dry needling, short-lived pain relief with these treatments. Lumbar MRI imaging from 2022 exhibits moderate bilateral facet arthropathy at L3-L4 and L4-L5, severe at L5-S1. There is moderate spinal canal stenosis at L3-L4 and L4-L5.  Patient had right L4 transforaminal epidural steroid injection performed in our office on 04/02/2021 and reports greater than 75% relief of pain for over a month.  Patient states she feels  radicular symptoms are now much more tolerable. Patient states her bilateral lower back pain is the most severe discomfort at this time and feels that this pain is keeping her from being physically active and participating in activities. Patient was recently treated by Dr. Marlaine Hind where she underwent diagnostic bilateral L4-L5 and L5-S1 with greater than 80% relief of pain. Patient denies focal weakness. Patient denies recent trauma or falls.   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     Review of Systems   Musculoskeletal:  Positive for back pain and myalgias.  Neurological:  Positive for tingling and sensory change. Negative for focal weakness and weakness.  All other systems reviewed and are negative.  Otherwise per HPI.  Assessment & Plan: Visit Diagnoses:    ICD-10-CM   1. Chronic bilateral low back pain without sciatica  M54.50 Ambulatory referral to Physical Medicine Rehab   G89.29     2. Spondylosis without myelopathy or radiculopathy, lumbar region  M47.816 Ambulatory referral to Physical Medicine Rehab    3. Facet hypertrophy of lumbar region  M47.816 Ambulatory referral to Physical Medicine Rehab       Plan: Findings:  Chronic, worsening and severe bilateral axial back pain. No radicular symptoms. Patient continues to have severe pain despite good conservative therapies such as formal physical therapy, home exercise regimen, rest and use of medications. Patients clinical presentation and exam are consistent with facet mediated pain. I also believe her fibromyalgia is working to exacerbate her symptoms. She does have severe pain with lumbar extension upon exam today. Next step is to perform diagnostic and hopefully therapeutic bilateral L4-L5 and L5-S1 facet joint/medial branch blocks under fluoroscopic guidance. If good relief with second set of diagnostic blocks we did discuss the possibility of longer sustained relief of pain with radiofrequency ablation procedure. We would consider repeating epidural steroid injection if radicular symptoms return. No red flag symptoms noted upon exam today.     Meds & Orders: No orders of the defined types were placed in this encounter.   Orders Placed This Encounter  Procedures   Ambulatory  referral to Physical Medicine Rehab    Follow-up: Return for Bilateral L4-L5 and L5-S1 facet joint/medial branch blocks.   Procedures: No procedures performed      Clinical History: MRI LUMBAR SPINE WITHOUT CONTRAST   TECHNIQUE: Multiplanar,  multisequence MR imaging of the lumbar spine was performed. No intravenous contrast was administered.   COMPARISON:  X-ray lumbar 02/12/2021.   FINDINGS: Segmentation:  Standard.   Alignment: 3 mm retrolisthesis of L1 on L2. Grade 1 anterolisthesis of L5 on S1 secondary to facet disease.   Vertebrae: No acute fracture, evidence of discitis, or aggressive bone lesion.   Conus medullaris and cauda equina: Conus extends to the L3-4 level. Conus and cauda equina appear normal.   Paraspinal and other soft tissues: No acute paraspinal abnormality.   Disc levels:   Disc spaces: Degenerative disease with disc height loss at L1-2. Disc desiccation at L3-4, L4-5 and L5-S1.   T12-L1: No significant disc bulge. No neural foraminal stenosis. No central canal stenosis.   L1-L2: Broad-based disc osteophyte complex with a prominent right lateral component. Mild right foraminal stenosis. No left foraminal stenosis. Mild bilateral facet arthropathy. Mild spinal stenosis.   L2-L3: No significant disc bulge. No neural foraminal stenosis. No central canal stenosis.   L3-L4: Mild broad-based disc bulge. Moderate bilateral facet arthropathy with bilateral facet effusions. Moderate spinal stenosis. Severe left foraminal stenosis. Moderate right foraminal stenosis.   L4-L5: Broad-based disc bulge. Moderate bilateral facet arthropathy. Moderate spinal stenosis. Moderate bilateral foraminal stenosis.   L5-S1: Mild broad-based disc bulge. Severe bilateral facet arthropathy. Mild left foraminal stenosis. No right foraminal stenosis. No spinal stenosis   IMPRESSION: 1. Lumbar spine spondylosis as described above. 2. No acute osseous injury of the lumbar spine.     Electronically Signed   By: Kathreen Devoid M.D.   On: 03/04/2021 11:41   She reports that she has never smoked. She has never used smokeless tobacco. No results for input(s): "HGBA1C", "LABURIC" in the last 8760 hours.  Objective:   VS:  HT:'4\' 10"'$  (147.3 cm)   WT:191 lb (86.6 kg)  BMI:39.93    BP:(!) 141/86  HR:96bpm  TEMP: ( )  RESP:  Physical Exam Vitals and nursing note reviewed.  HENT:     Head: Normocephalic and atraumatic.     Right Ear: External ear normal.     Left Ear: External ear normal.     Nose: Nose normal.     Mouth/Throat:     Mouth: Mucous membranes are moist.  Eyes:     Extraocular Movements: Extraocular movements intact.  Cardiovascular:     Rate and Rhythm: Normal rate.     Pulses: Normal pulses.  Pulmonary:     Effort: Pulmonary effort is normal.  Abdominal:     General: Abdomen is flat. There is no distension.  Musculoskeletal:        General: Tenderness present.     Cervical back: Normal range of motion.     Comments: Pt rises from seated position to standing without difficulty. Concordant low back pain with facet loading, lumbar spine extension and rotation. Strong distal strength without clonus, no pain upon palpation of greater trochanters. Sensation intact bilaterally. Walks independently, gait steady.   Skin:    General: Skin is warm and dry.     Capillary Refill: Capillary refill takes less than 2 seconds.  Neurological:     General: No focal deficit present.     Mental Status: She is alert.  Psychiatric:  Mood and Affect: Mood normal.        Behavior: Behavior normal.     Ortho Exam  Imaging: No results found.  Past Medical/Family/Surgical/Social History: Medications & Allergies reviewed per EMR, new medications updated. 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   Fibromyalgia 08/12/2016   High risk medication use 08/12/2016   Primary osteoarthritis of both hands 08/12/2016   Acute midline low back pain 08/12/2016   DDD (degenerative disc disease), lumbar 08/12/2016   History of hypertension 08/12/2016   History of high cholesterol  08/12/2016   Thyroid ca ([place]) 08/12/2016   History of hypothyroidism 08/12/2016   History of gastroesophageal reflux (GERD) 08/12/2016   History of TMJ syndrome 08/12/2016   Burning tongue syndrome 08/12/2016   History of vitamin D deficiency 08/12/2016   Other sleep apnea 08/12/2016   Vitamin D deficiency 08/12/2016   Rheumatoid arthritis with rheumatoid factor of multiple sites without organ or systems involvement ([place]) 12/31/2015   Goals of care, counseling/discussion 06/02/2013   Myofascial muscle pain 06/02/2013   Narcotic-induced mood disorder ([place]) 06/02/2013   Inflammatory arthritis 04/08/2011   Plantar fasciitis 04/08/2011   Yeast infection 04/08/2011   Past Medical History:  Diagnosis Date   Anemia    Anxiety    Arthritis    Asthma    Back pain    Cancer ([place])    Depression    Fibromyalgia    Gallbladder problem    GERD (gastroesophageal reflux disease)    Hyperlipidemia    Hypertension    Hypothyroidism    thyroidectomy   Joint pain    Morton neuroma, right    per patient    Neoplasm of parotid gland    Neuromuscular disorder ([place])    Osteoarthritis    Palpitation    Prediabetes    Rheumatoid aortitis    Sleep apnea    uses CPAP sometimes   Swelling of lower extremity    Vitamin D deficiency    Family History  Problem Relation Age of Onset   Alzheimer's disease Mother    Hypertension Mother    Hyperlipidemia Mother    Alzheimer's disease Sister    Diabetes Brother    High Cholesterol Brother    Hypertension Brother    Diabetes Sister    Hypertension Sister    High Cholesterol Sister    Diabetes Sister    Hypertension Sister    High Cholesterol Sister    Diabetes Brother    Hypertension Brother    High Cholesterol Brother    Healthy Daughter    Healthy Daughter    Past Surgical History:  Procedure Laterality Date   ABDOMINAL HYSTERECTOMY     CHOLECYSTECTOMY     HAMMER TOE SURGERY     MASS EXCISION N/A 11/17/2017   Procedure: EXCISION  SOFT PALATE LESION;  Surgeon: Rozetta Nunnery, MD;  Location: [facility];  Service: ENT;  Laterality: N/A;   PAROTIDECTOMY Left 04/08/2016   Procedure: LEFT SUPERFICIAL PAROTIDECTOMY WITH FACIAL NERVE DISECTION;  Surgeon: Rozetta Nunnery, MD;  Location: [facility];  Service: ENT;  Laterality: Left;   TARSAL METATARSAL ARTHRODESIS Right 02/03/2018   Procedure: RIGHT GREAT TOE  FUSION;  Surgeon: Leandrew Koyanagi, MD;  Location: [facility];  Service: Orthopedics;  Laterality: Right;   THYROIDECTOMY     Social  History   Occupational History   Not on file  Tobacco Use   Smoking status: Never   Smokeless tobacco: Never  Vaping Use   Vaping Use: Never used  Substance and Sexual Activity   Alcohol use: No   Drug use: No   Sexual activity: Not on file

## 2022-01-24 NOTE — Telephone Encounter (Signed)
I spoke to daughter, explained rationale for current plan.   She agreed with the plan.

## 2022-01-26 ENCOUNTER — Encounter: Payer: Self-pay | Admitting: Physical Medicine and Rehabilitation

## 2022-01-28 ENCOUNTER — Telehealth (HOSPITAL_BASED_OUTPATIENT_CLINIC_OR_DEPARTMENT_OTHER): Payer: Self-pay | Admitting: Family Medicine

## 2022-01-28 ENCOUNTER — Ambulatory Visit (HOSPITAL_BASED_OUTPATIENT_CLINIC_OR_DEPARTMENT_OTHER): Payer: 59 | Admitting: Family Medicine

## 2022-01-28 ENCOUNTER — Telehealth (HOSPITAL_BASED_OUTPATIENT_CLINIC_OR_DEPARTMENT_OTHER): Payer: Self-pay

## 2022-01-28 ENCOUNTER — Telehealth: Payer: Self-pay | Admitting: Physical Medicine and Rehabilitation

## 2022-01-28 DIAGNOSIS — F1120 Opioid dependence, uncomplicated: Secondary | ICD-10-CM

## 2022-01-28 MED ORDER — BUPRENORPHINE HCL-NALOXONE HCL 8-2 MG SL FILM
ORAL_FILM | SUBLINGUAL | 0 refills | Status: DC
Start: 2022-01-28 — End: 2022-02-04

## 2022-01-28 NOTE — Telephone Encounter (Signed)
Patient called. She would like an appointment with Dr. Ernestina Patches. Her call back number is [phone]

## 2022-01-28 NOTE — Telephone Encounter (Signed)
Julieta Gutting, MD  [facility] Front Desk Pool  Can you put patient on [facility] lab's schedule for 02/07/22 12:00PM right after her ortho apt?    Thanks,  Lyndee Leo      Appt scheduled--

## 2022-01-28 NOTE — Telephone Encounter (Signed)
No show to OBAT apt today  Spoke with patient  She states since her stroke memory has been off  Keeps forgetting what week it is  Thought it was Monday today which is why she missed group  Has been stable in terms of sobriety but overwhelmed with medical issues and chronic pain  She endorses taking clonazepam illicitly 0.'5mg'$  qHS PRN for sleep - she does not want anything else, states everything else she has tried does not work. She goes to pharmacy with person who is prescribed this medication so she knows it is not contaminated. She doesn't want an alternative. Has been offered counselling by [facility] but decided against it because it didn't help in the past.    She'll come to group next week  Will write herself a phone reminder  Can provide urine at [facility] lab after her orthopedics appointment    Julieta Gutting, MD

## 2022-01-30 ENCOUNTER — Ambulatory Visit: Payer: 59 | Admitting: Physical Medicine and Rehabilitation

## 2022-01-30 ENCOUNTER — Ambulatory Visit: Payer: Self-pay

## 2022-01-30 DIAGNOSIS — M47816 Spondylosis without myelopathy or radiculopathy, lumbar region: Secondary | ICD-10-CM

## 2022-01-30 MED ORDER — BUPIVACAINE HCL 0.5 % IJ SOLN
3.0000 mL | Freq: Once | INTRAMUSCULAR | Status: AC
  Administered 2022-01-30: 3 mL

## 2022-01-30 NOTE — Patient Instructions (Signed)

## 2022-01-31 ENCOUNTER — Encounter: Payer: Self-pay | Admitting: Physical Medicine and Rehabilitation

## 2022-02-04 ENCOUNTER — Ambulatory Visit: Payer: 59 | Admitting: Family Medicine

## 2022-02-04 ENCOUNTER — Encounter (HOSPITAL_BASED_OUTPATIENT_CLINIC_OR_DEPARTMENT_OTHER): Payer: Self-pay

## 2022-02-04 ENCOUNTER — Ambulatory Visit: Payer: 59 | Admitting: Clinical

## 2022-02-04 DIAGNOSIS — F1120 Opioid dependence, uncomplicated: Secondary | ICD-10-CM

## 2022-02-04 MED ORDER — BUPRENORPHINE HCL-NALOXONE HCL 8-2 MG SL FILM
ORAL_FILM | SUBLINGUAL | 0 refills | Status: DC
Start: 2022-02-04 — End: 2022-02-11

## 2022-02-04 NOTE — BH OP Treatment Plan (Signed)
Patient/Guardian:  contributed to the creation of the treatment plan.    Strengths/Skills: Help seeking and motivated to get better     Potential Barriers: Multiple life stressors       Patient stated goals: Pt would like to prevent relapses and support her recovery process     Problem 1: Opioid Dependence on Agonist Therapy (Opioid Use Disorder on Agonist Therapy)      Short Term Goals: Pt will use at least one copying skill a day to reduce a risk of relapse, enhance coping with OUD, and support her recovery process     Short Term Target:  60 days  Short TermTarget Date: 04/05/2022  Short Term Goal #1 Progress: progressing     Long Term Goals: Pt will report 50% in urges and cravings reduction and no relapses or close calls       Long Term Target:  90 days  Long TermTarget Date:  05/05/2022   Long Term Goal #1 Progress:  progressing      Intervention #1: PCBHI-based OBOT/Group Psychotherapy   Intervention Frequency:  biweekly  Intervention Duration:  90 Days  Intervention Responsibility:  J. Verdis Frederickson, PhD and the OBOT team

## 2022-02-04 NOTE — Progress Notes (Signed)
Patient attempted to join group but joined incorrectly (via telehealth link). Working with OBAT nurse to join group via [employer]. Refill sent.    Julieta Gutting, MD

## 2022-02-05 NOTE — Progress Notes (Signed)
Pt was trying to join the group but was unable due to technical issues.

## 2022-02-06 ENCOUNTER — Encounter: Payer: 59 | Admitting: Physical Medicine and Rehabilitation

## 2022-02-07 ENCOUNTER — Ambulatory Visit (HOSPITAL_BASED_OUTPATIENT_CLINIC_OR_DEPARTMENT_OTHER): Payer: 59

## 2022-02-07 ENCOUNTER — Ambulatory Visit (HOSPITAL_BASED_OUTPATIENT_CLINIC_OR_DEPARTMENT_OTHER): Payer: 59 | Admitting: Specialist

## 2022-02-10 NOTE — Procedures (Signed)
Lumbar Diagnostic Facet Joint Nerve Block with Fluoroscopic Guidance   Patient: Michelle Roberts      Date of Birth: Feb 19, 1956 MRN: [id number] PCP: Bryson Corona, NP      Visit Date: 01/30/2022   Universal Protocol:    Date/Time: 10/16/236:28 AM  Consent Given By: the patient  Position: PRONE  Additional Comments: Vital signs were monitored before and after the procedure. Patient was prepped and draped in the usual sterile fashion. The correct patient, procedure, and site was verified.   Injection Procedure Details:   Procedure diagnoses:  1. Spondylosis without myelopathy or radiculopathy, lumbar region      Meds Administered:  Meds ordered this encounter  Medications   bupivacaine (MARCAINE) 0.5 % (with pres) injection 3 mL     Laterality: Bilateral  Location/Site: L4-L5, L3 and L4 medial branches and L5-S1, L4 medial branch and L5 dorsal ramus  Needle: 5.0 in., 25 ga.  Short bevel or Quincke spinal needle  Needle Placement: Oblique pedical  Findings:   -Comments: There was excellent flow of contrast along the articular pillars without intravascular flow.  Procedure Details: The fluoroscope beam is vertically oriented in AP and then obliqued 15 to 20 degrees to the ipsilateral side of the desired nerve to achieve the "Scotty dog" appearance.  The skin over the target area of the junction of the superior articulating process and the transverse process (sacral ala if blocking the L5 dorsal rami) was locally anesthetized with a 1 ml volume of 1% Lidocaine without Epinephrine.  The spinal needle was inserted and advanced in a trajectory view down to the target.   After contact with periosteum and negative aspirate for blood and CSF, correct placement without intravascular or epidural spread was confirmed by injecting 0.5 ml. of Isovue-250.  A spot radiograph was obtained of this image.    Next, a 0.5 ml. volume of the injectate described above was injected. The needle  was then redirected to the other facet joint nerves mentioned above if needed.  Prior to the procedure, the patient was given a Pain Diary which was completed for baseline measurements.  After the procedure, the patient rated their pain every 30 minutes and will continue rating at this frequency for a total of 5 hours.  The patient has been asked to complete the Diary and return to [place] by mail, fax or hand delivered as soon as possible.   Additional Comments:  The patient tolerated the procedure well Dressing: 2 x 2 sterile gauze and Band-Aid    Post-procedure details: Patient was observed during the procedure. Post-procedure instructions were reviewed.  Patient left the clinic in stable condition.

## 2022-02-10 NOTE — Progress Notes (Signed)
Michelle Roberts - 66 y.o. female MRN [id number]  Date of birth: [birth year]  Office Visit Note: Visit Date: 01/30/2022 PCP: Bryson Corona, NP Referred by: Lorine Bears, NP  Subjective: Chief Complaint  Patient presents with   Lower Back - Pain   HPI:  Michelle Roberts is a 66 y.o. female who comes in today at the request of Barnet Pall, FNP for planned Bilateral  L4-5 and L5-S1 Lumbar facet/medial branch block with fluoroscopic guidance.  The patient has failed conservative care including home exercise, medications, time and activity modification.  This injection will be diagnostic and hopefully therapeutic.  Please see requesting physician notes for further details and justification.  Exam has shown concordant pain with facet joint loading.   ROS Otherwise per HPI.  Assessment & Plan: Visit Diagnoses:    ICD-10-CM   1. Spondylosis without myelopathy or radiculopathy, lumbar region  M47.816 XR C-ARM NO REPORT    Facet Injection    bupivacaine (MARCAINE) 0.5 % (with pres) injection 3 mL      Plan: No additional findings.   Meds & Orders:  Meds ordered this encounter  Medications   bupivacaine (MARCAINE) 0.5 % (with pres) injection 3 mL    Orders Placed This Encounter  Procedures   Facet Injection   XR C-ARM NO REPORT    Follow-up: Return for Review Pain Diary.   Procedures: No procedures performed  Lumbar Diagnostic Facet Joint Nerve Block with Fluoroscopic Guidance   Patient: Michelle Roberts      Date of Birth: 02-24-1956 MRN: [id number] PCP: Bryson Corona, NP      Visit Date: 01/30/2022   Universal Protocol:    Date/Time: 10/16/236:28 AM  Consent Given By: the patient  Position: PRONE  Additional Comments: Vital signs were monitored before and after the procedure. Patient was prepped and draped in the usual sterile fashion. The correct patient, procedure, and site was verified.   Injection Procedure Details:   Procedure diagnoses:  1.  Spondylosis without myelopathy or radiculopathy, lumbar region      Meds Administered:  Meds ordered this encounter  Medications   bupivacaine (MARCAINE) 0.5 % (with pres) injection 3 mL     Laterality: Bilateral  Location/Site: L4-L5, L3 and L4 medial branches and L5-S1, L4 medial branch and L5 dorsal ramus  Needle: 5.0 in., 25 ga.  Short bevel or Quincke spinal needle  Needle Placement: Oblique pedical  Findings:   -Comments: There was excellent flow of contrast along the articular pillars without intravascular flow.  Procedure Details: The fluoroscope beam is vertically oriented in AP and then obliqued 15 to 20 degrees to the ipsilateral side of the desired nerve to achieve the "Scotty dog" appearance.  The skin over the target area of the junction of the superior articulating process and the transverse process (sacral ala if blocking the L5 dorsal rami) was locally anesthetized with a 1 ml volume of 1% Lidocaine without Epinephrine.  The spinal needle was inserted and advanced in a trajectory view down to the target.   After contact with periosteum and negative aspirate for blood and CSF, correct placement without intravascular or epidural spread was confirmed by injecting 0.5 ml. of Isovue-250.  A spot radiograph was obtained of this image.    Next, a 0.5 ml. volume of the injectate described above was injected. The needle was then redirected to the other facet joint nerves mentioned above if needed.  Prior to the procedure, the patient was given  a Pain Diary which was completed for baseline measurements.  After the procedure, the patient rated their pain every 30 minutes and will continue rating at this frequency for a total of 5 hours.  The patient has been asked to complete the Diary and return to [place] by mail, fax or hand delivered as soon as possible.   Additional Comments:  The patient tolerated the procedure well Dressing: 2 x 2 sterile gauze and Band-Aid     Post-procedure details: Patient was observed during the procedure. Post-procedure instructions were reviewed.  Patient left the clinic in stable condition.   Clinical History: MRI LUMBAR SPINE WITHOUT CONTRAST   TECHNIQUE: Multiplanar, multisequence MR imaging of the lumbar spine was performed. No intravenous contrast was administered.   COMPARISON:  X-ray lumbar 02/12/2021.   FINDINGS: Segmentation:  Standard.   Alignment: 3 mm retrolisthesis of L1 on L2. Grade 1 anterolisthesis of L5 on S1 secondary to facet disease.   Vertebrae: No acute fracture, evidence of discitis, or aggressive bone lesion.   Conus medullaris and cauda equina: Conus extends to the L3-4 level. Conus and cauda equina appear normal.   Paraspinal and other soft tissues: No acute paraspinal abnormality.   Disc levels:   Disc spaces: Degenerative disease with disc height loss at L1-2. Disc desiccation at L3-4, L4-5 and L5-S1.   T12-L1: No significant disc bulge. No neural foraminal stenosis. No central canal stenosis.   L1-L2: Broad-based disc osteophyte complex with a prominent right lateral component. Mild right foraminal stenosis. No left foraminal stenosis. Mild bilateral facet arthropathy. Mild spinal stenosis.   L2-L3: No significant disc bulge. No neural foraminal stenosis. No central canal stenosis.   L3-L4: Mild broad-based disc bulge. Moderate bilateral facet arthropathy with bilateral facet effusions. Moderate spinal stenosis. Severe left foraminal stenosis. Moderate right foraminal stenosis.   L4-L5: Broad-based disc bulge. Moderate bilateral facet arthropathy. Moderate spinal stenosis. Moderate bilateral foraminal stenosis.   L5-S1: Mild broad-based disc bulge. Severe bilateral facet arthropathy. Mild left foraminal stenosis. No right foraminal stenosis. No spinal stenosis   IMPRESSION: 1. Lumbar spine spondylosis as described above. 2. No acute osseous injury of the lumbar  spine.     Electronically Signed   By: Kathreen Devoid M.D.   On: 03/04/2021 11:41     Objective:  VS:  HT:    WT:   BMI:     BP:   HR: bpm  TEMP: ( )  RESP:  Physical Exam Vitals and nursing note reviewed.  Constitutional:      General: She is not in acute distress.    Appearance: Normal appearance. She is not ill-appearing.  HENT:     Head: Normocephalic and atraumatic.     Right Ear: External ear normal.     Left Ear: External ear normal.  Eyes:     Extraocular Movements: Extraocular movements intact.  Cardiovascular:     Rate and Rhythm: Normal rate.     Pulses: Normal pulses.  Pulmonary:     Effort: Pulmonary effort is normal. No respiratory distress.  Abdominal:     General: There is no distension.     Palpations: Abdomen is soft.  Musculoskeletal:        General: Tenderness present.     Cervical back: Neck supple.     Right lower leg: No edema.     Left lower leg: No edema.     Comments: Patient has good distal strength with no pain over the greater trochanters.  No clonus  or focal weakness.  Skin:    Findings: No erythema, lesion or rash.  Neurological:     General: No focal deficit present.     Mental Status: She is alert and oriented to person, place, and time.     Sensory: No sensory deficit.     Motor: No weakness or abnormal muscle tone.     Coordination: Coordination normal.  Psychiatric:        Mood and Affect: Mood normal.        Behavior: Behavior normal.      Imaging: No results found.

## 2022-02-11 ENCOUNTER — Ambulatory Visit: Payer: 59 | Attending: Family Medicine | Admitting: Family Medicine

## 2022-02-11 DIAGNOSIS — F1120 Opioid dependence, uncomplicated: Secondary | ICD-10-CM

## 2022-02-11 MED ORDER — BUPRENORPHINE HCL-NALOXONE HCL 8-2 MG SL FILM
ORAL_FILM | SUBLINGUAL | 0 refills | Status: DC
Start: 2022-02-11 — End: 2022-02-25

## 2022-02-11 NOTE — Progress Notes (Signed)
Cheryl Dixon is a 66 year old female seen in follow up for opioid use disorder:    Buprenorphine dose: '20mg'$   Response, adequacy of dose: good  Relapses/close calls: none  Trigger: denies  Meetings: none currently    Social history/events:  Doing well - everything's good  Had some trouble getting on last few months but missed the group  Wishes everyone a happy halloween    Present Medications:  buprenorphine-naloxone (SUBOXONE) 8-2 MG sublingual film, Take 2.5 films under the tongue daily., Disp: 35 Film, Rfl: 0  buPROPion (WELLBUTRIN SR) 200 MG 12 hr tablet, Take 1 tablet by mouth in the morning and 1 tablet before bedtime., Disp: 180 tablet, Rfl: 3  aspirin 81 MG chewable tablet, Take 1 tablet by mouth in the morning., Disp: 90 tablet, Rfl: 3  atorvastatin (LIPITOR) 80 MG tablet, Take 1 tablet by mouth Daily after dinner, Disp: 90 tablet, Rfl: 3  solifenacin (VESICARE) 10 MG tablet, Take 1 tablet by mouth in the morning., Disp: , Rfl:   polyethylene glycol (GOLYTELY) 236 g suspension, Take 4,000 mLs by mouth See Admin Instructions Take as directed prior to your colonoscopy, Disp: 4000 mL, Rfl: 0  Simethicone 80 MG TABS, Take 4 tablets by mouth See Admin Instructions Take as directed prior to colonoscopy., Disp: 4 tablet, Rfl: 0  polyethylene glycol (GLYCOLAX/MIRALAX) 17 g packet, Take 1 packet by mouth in the morning., Disp: 90 packet, Rfl: 3  risedronate (ACTONEL) 150 MG tablet, Take 1 tablet by mouth every 30 (thirty) days with full glass of water on empty stomach-nothing else by mouth and do not lie down for 1/2 hour, Disp: 3 tablet, Rfl: 3  tiotropium-olodaterol (STIOLTO RESPIMAT) 2.5-2.5 MCG/ACT inhal, Inhale 2 puffs into the lungs in the morning., Disp: 1 each, Rfl: 5  EC-NAPROXEN 500 MG EC tablet, TAKE 1 TABLET BY MOUTH IN THE MORNING AND IN THE EVENING WITH MEALS, Disp: 60 tablet, Rfl: 2  albuterol HFA 108 (90 Base) MCG/ACT inhaler, INHALE 2 PUFFS INTO THE LUNGS EVERY 6 HOURS AS NEEDED FOR WHEEZING OR  SHORTNESS OF BREATH, Disp: 8.5 g, Rfl: 11  cholecalciferol (VITAMIN D3) 2000 UNIT TABS tablet, Take 1 tablet by mouth in the morning., Disp: 90 tablet, Rfl: 3  acetaminophen (TYLENOL) 500 MG tablet, Take 500 mg by mouth every 6 (six) hours as needed for Pain, Disp: , Rfl:   ipratropium-albuterol (DUO-NEB) 0.5-2.5 (3) MG/3ML SOLN Inhalation Solution, Take 3 mLs by nebulization 4 (four) times daily., Disp: 180 mL, Rfl: 0    No current facility-administered medications on file prior to visit.          12/19/2021     8:27 AM 01/12/2019    10:47 AM 06/01/2018     5:02 PM   PHQ-9 TOTAL SCORE   Doc FlowSheet Total Score '17 10 6   '$ MyChart Total Score 17 (BPA)             PHYSICAL EXAMINATION via video:   General appearance - healthy female in no distress  Neuro - nonfocal  Affect - normal  Behavior without evidence of intoxication or impairment    PMP Reviewed.  No unauthorized prescriptions since last refill.     Assessment:  Opioid Dependence  Comment: Last UDS +unprescribed benzodiazepines, consistent for buprenorphine. States she is taking illicit benzodiazepines for sleep and not interested in alternatives, aware of risks. Continues to benefit from suboxone in harm reduction framework.   Plan:  Continue buprenorphine, reviewed criteria for tapering when patient  is ready. Discussed sources of support.  Patient is counseled regarding relapse prevention, involvement in recovery groups, and potential side effects of buprenorphine.    For the patient participating in the group via telephone and/or video technology: the patient/guardian has verbally consented to participate in this group therapy session by televisit. The patient/guardian was encouraged to be in a private location due to personal health information being discussed and where they could not be overheard by individuals not participating in the group therapy. Although all [facility] staff/providers participating in this group therapy televisit are in a private location, all  the risks of telephone and/or video technology used to conduct this group therapy session cannot be controlled by [facility], (i.e. interruptions, unauthorized access/recording and technical difficulties).  Each patient understands that the visit can be stopped at any time for any reason, including if the conferencing connections are inadequate.     Julieta Gutting, MD, 02/11/2022

## 2022-02-13 ENCOUNTER — Other Ambulatory Visit (HOSPITAL_BASED_OUTPATIENT_CLINIC_OR_DEPARTMENT_OTHER): Payer: Self-pay | Admitting: Internal Medicine

## 2022-02-13 DIAGNOSIS — N952 Postmenopausal atrophic vaginitis: Secondary | ICD-10-CM

## 2022-02-13 MED ORDER — ESTRADIOL 0.1 MG/GM VA CREA
TOPICAL_CREAM | VAGINAL | 0 refills | Status: DC
Start: 2022-02-13 — End: 2022-03-13

## 2022-02-13 NOTE — Telephone Encounter (Signed)
PER Patient (self), Cheryl Dixon is a 66 year old female has requested a refill of      - estradiol (ESTRACE) 0.1 MG/GM vaginal cream      Last Office Visit: 02/11/22 with Andris Baumann, C  Last Physical Exam: 05/27/13     There are no preventive care reminders to display for this patient.     Other Med Adult:  Most Recent BP Reading(s)  12/27/21 : 138/72        Cholesterol (mg/dL)   Date Value   12/12/2021 222     LOW DENSITY LIPOPROTEIN DIRECT (mg/dL)   Date Value   12/12/2021 160     HIGH DENSITY LIPOPROTEIN (mg/dL)   Date Value   12/12/2021 44     TRIGLYCERIDES (mg/dL)   Date Value   12/12/2021 119         THYROID SCREEN TSH REFLEX FT4 (uIU/mL)   Date Value   01/12/2019 1.630         No results found for: TSH    HEMOGLOBIN A1C (%)   Date Value   12/12/2021 5.7 (H)       No results found for: POCA1C      INR (no units)   Date Value   12/12/2021 1.1   02/16/2007 1.0 (L)   07/03/2006 < 1.0 (L)       SODIUM (mmol/L)   Date Value   12/12/2021 140       POTASSIUM (mmol/L)   Date Value   12/12/2021 4.2           CREATININE (mg/dL)   Date Value   12/12/2021 1.0        Documented patient preferred pharmacies:      [facility], [place], [place] [facility] [place]  Phone: [phone] Fax: [phone]

## 2022-02-19 ENCOUNTER — Other Ambulatory Visit: Payer: Self-pay

## 2022-02-19 ENCOUNTER — Encounter (HOSPITAL_BASED_OUTPATIENT_CLINIC_OR_DEPARTMENT_OTHER): Payer: Self-pay | Admitting: OB/Gyn

## 2022-02-19 ENCOUNTER — Ambulatory Visit: Payer: 59 | Attending: OB/Gyn | Admitting: OB/Gyn

## 2022-02-19 VITALS — BP 134/87 | HR 66 | Wt 195.0 lb

## 2022-02-19 DIAGNOSIS — R102 Pelvic and perineal pain unspecified side: Secondary | ICD-10-CM

## 2022-02-19 NOTE — Progress Notes (Signed)
Department of Obstetrics & Gynecology   Consult Note     CC: vaginal mass    HPI: Cheryl Dixon is a [phone] [id number] female who presents for evaluation of a vaginal mass she palpated. Reports intermittently feeling a bulge in the posterior part of her vagina. Some days it is palpable and other days it is less noticeable. Denies any sensation of vaginal bulge. Reports chronic urinary urgency for 68yr which improved with Vesicare. Describes vaginal dryness and pruritus which is relieved with vaginal estrogen cream. Started using it in 2021 and applies the cream 3x per week. Underwent a hysterectomy and BSO in the 90s for menorrhagia.     Reports chronic constipation due to suboxone and many other medications. This has improved with multiple stool softeners recently. Describes many years of chronic intermittent LLQ pain particularly with exercise or heavy lifting. Did have a right sided hernia which was repaired with mesh multiple times but describes the LLQ pain as feeling different. Since her constipation has improved thinks the LLQ pain is also less.     Patient had 3 vaginal deliveries and her first was a forceps assisted delivery. Does not remember what type of perineal laceration she had. Denies any fecal incontinence.     Patient Active Problem List:     Opioid dependence on agonist therapy (HCC)     Tobacco use disorder     Fibrocystic breast     Elevated BP without diagnosis of hypertension     Knee pain     Chronic low back pain     OA (osteoarthritis) of knee     H. pylori infection     Major depressive disorder, recurrent episode, severe (HCC)     Occult blood in stools     Prediabetes     Epigastric pain     Chronic obstructive pulmonary disease (HCC)     Left foot pain     Multiple polyps of colon     Acquired deformity of toe, right     Dislocation of metatarsophalangeal joint of lesser toe, right, sequela     Postmenopausal atrophic vaginitis     Abnormal loss of weight     Aneurysm of ascending aorta  without rupture ([facility]     COVID-19 virus infection     BMI 32.0-32.9,adult     Dysuria     LLQ pain     Osteoporosis     History of endometriosis     Lung cancer screening     Constipation, drug induced     Weakness     Cerebrovascular accident (CVA) ([facility]     Past Surgical History:  No date: ENDOMETRIAL ABLTJ THERMAL W/O HYSTEROSCOPIC GID  No date: PR ANES HRNA REPAIR UPR ABD TABDL RPR DIPHRG HRNA  No date: PR ANES IPER LOWER ABD W/LAPS RAD HYSTERECTOMY  No date: RPR 1ST INGUN HRNA PRETERM INFT [facility]   Review of patient's family history indicates:  Problem: Heart      Relation: Father          Age of Onset: (Not Specified)          Comment: CAD, died age 64 Problem: Pulmonary      Relation: Father          Age of Onset: (Not Specified)          Comment: COPD  Problem: Alcohol/Drug Abuse      Relation: Father          Age  of Onset: (Not Specified)          Comment: alcohol use disorder  Problem: Mental/Emotional Disorders      Relation: Father          Age of Onset: (Not Specified)          Comment: PTSD  Problem: Heart      Relation: Mother          Age of Onset: (Not Specified)          Comment: CAD  Problem: Heart      Relation: Brother          Age of Onset: (Not Specified)          Comment: CAD, died age 103  Problem: Cancer - Breast      Relation: Maternal Aunt          Age of Onset: (Not Specified)          Comment: age 23  Problem: Cancer - Breast      Relation: Maternal Aunt          Age of Onset: (Not Specified)          Comment: age 34      OB History    No obstetric history on file.    Gynecologic hx:    Current Outpatient Medications   Medication Instructions    acetaminophen (TYLENOL) 500 mg, Oral, EVERY 6 HOURS PRN    albuterol HFA 108 (90 Base) MCG/ACT inhaler INHALE 2 PUFFS INTO THE LUNGS EVERY 6 HOURS AS NEEDED FOR WHEEZING OR SHORTNESS OF BREATH    aspirin 81 mg, Oral, DAILY    atorvastatin (LIPITOR) 80 mg, Oral, DAILY AFTER DINNER    buprenorphine-naloxone (SUBOXONE) 8-2 MG sublingual film Take  2.5 films under the tongue daily.    buPROPion (WELLBUTRIN SR) 200 mg, Oral, 2 TIMES DAILY    cholecalciferol (VITAMIN D3) 2,000 Units, Oral, DAILY    EC-NAPROXEN 500 MG EC tablet TAKE 1 TABLET BY MOUTH IN THE MORNING AND IN THE EVENING WITH MEALS    estradiol (ESTRACE) 0.1 MG/GM vaginal cream PLACE 5 G VAGINALLY 3 TIMES A WEEK MYLAN BRAND ONLY.    ipratropium-albuterol (DUO-NEB) 0.5-2.5 (3) MG/3ML SOLN Inhalation Solution 3 mLs, Nebulization, 4 TIMES DAILY    polyethylene glycol (GLYCOLAX/MIRALAX) 17 g, Oral, DAILY    polyethylene glycol (GOLYTELY) 236 g suspension 4,000 mLs, Oral, SEE ADMIN INSTRUCTIONS, Take as directed prior to your colonoscopy    risedronate (ACTONEL) 150 mg, Oral, EVERY 30 DAYS, with full glass of water on empty stomach-nothing else by mouth and do not lie down for 1/2 hour    Simethicone 80 MG TABS 4 tablets, Oral, SEE ADMIN INSTRUCTIONS, Take as directed prior to colonoscopy.    solifenacin (VESICARE) 10 mg, Oral, DAILY    tiotropium-olodaterol (STIOLTO RESPIMAT) 2.5-2.5 MCG/ACT inhal 2 puffs, Inhalation, DAILY       O: BP 134/87   Pulse 66   Wt 88.5 kg (195 lb)   LMP 11/20/1991   BMI 36.84 kg/m    Gen: NAD, alert, well appearing   Resp: normal efforts  CV: regular rate  Abd: soft, non-tender, non-distended   Pelvic: normal external female genitalia, minimal rectocele at rest but stage III rectocele with valsalva, stage II cystocele    Ext: WWP, no edema       A/P: Cheryl Dixon is a 29XA [id number] female who presents for evaluation of a vaginal mass she palpated consistent with a rectocele. Patient  at increased risk with 3 vaginal deliveries including a forceps assisted delivery in addition to chronic constipation.     - counseled on pelvic floor prolapse with rectocele visible on pelvic exam   - discussed management options including expectant management, pelvic floor physical therapy, surgery and pessary; patient opted for pelvic floor PT   - encouraged to reach out with any questions or  concerns or if she does not feel the PFPT is sufficient   - recommend continued use of vaginal estrogen       I spent a total of 40 minutes on this visit on the date of service (total time includes all activities performed on the date of service)     Eden Emms, MD, 02/19/2022

## 2022-02-25 ENCOUNTER — Ambulatory Visit (HOSPITAL_BASED_OUTPATIENT_CLINIC_OR_DEPARTMENT_OTHER): Payer: 59 | Admitting: Family Medicine

## 2022-02-25 ENCOUNTER — Ambulatory Visit: Payer: 59 | Attending: Family Medicine | Admitting: Family Medicine

## 2022-02-25 ENCOUNTER — Encounter (HOSPITAL_BASED_OUTPATIENT_CLINIC_OR_DEPARTMENT_OTHER): Payer: Self-pay | Admitting: Family Medicine

## 2022-02-25 DIAGNOSIS — F1120 Opioid dependence, uncomplicated: Secondary | ICD-10-CM | POA: Insufficient documentation

## 2022-02-25 MED ORDER — BUPRENORPHINE HCL-NALOXONE HCL 8-2 MG SL FILM
ORAL_FILM | SUBLINGUAL | 0 refills | Status: DC
Start: 2022-02-25 — End: 2022-03-11

## 2022-02-25 NOTE — Progress Notes (Signed)
Cheryl Dixon is a 66 year old female seen in follow up for opioid use disorder:    Buprenorphine dose: '20mg'$   Response, adequacy of dose: good  Relapses/close calls: none  Trigger: denies  Meetings: none currently    Social history/events:  Doing well  Remains sober  Doesn't have any concerns with cravings or her suboxone dose    Present Medications:  estradiol (ESTRACE) 0.1 MG/GM vaginal cream, PLACE 5 G VAGINALLY 3 TIMES A WEEK MYLAN BRAND ONLY., Disp: 170 g, Rfl: 0  buprenorphine-naloxone (SUBOXONE) 8-2 MG sublingual film, Take 2.5 films under the tongue daily., Disp: 35 Film, Rfl: 0  buPROPion (WELLBUTRIN SR) 200 MG 12 hr tablet, Take 1 tablet by mouth in the morning and 1 tablet before bedtime., Disp: 180 tablet, Rfl: 3  aspirin 81 MG chewable tablet, Take 1 tablet by mouth in the morning., Disp: 90 tablet, Rfl: 3  atorvastatin (LIPITOR) 80 MG tablet, Take 1 tablet by mouth Daily after dinner, Disp: 90 tablet, Rfl: 3  solifenacin (VESICARE) 10 MG tablet, Take 1 tablet by mouth in the morning., Disp: , Rfl:   polyethylene glycol (GOLYTELY) 236 g suspension, Take 4,000 mLs by mouth See Admin Instructions Take as directed prior to your colonoscopy, Disp: 4000 mL, Rfl: 0  Simethicone 80 MG TABS, Take 4 tablets by mouth See Admin Instructions Take as directed prior to colonoscopy., Disp: 4 tablet, Rfl: 0  polyethylene glycol (GLYCOLAX/MIRALAX) 17 g packet, Take 1 packet by mouth in the morning., Disp: 90 packet, Rfl: 3  risedronate (ACTONEL) 150 MG tablet, Take 1 tablet by mouth every 30 (thirty) days with full glass of water on empty stomach-nothing else by mouth and do not lie down for 1/2 hour, Disp: 3 tablet, Rfl: 3  tiotropium-olodaterol (STIOLTO RESPIMAT) 2.5-2.5 MCG/ACT inhal, Inhale 2 puffs into the lungs in the morning., Disp: 1 each, Rfl: 5  EC-NAPROXEN 500 MG EC tablet, TAKE 1 TABLET BY MOUTH IN THE MORNING AND IN THE EVENING WITH MEALS, Disp: 60 tablet, Rfl: 2  albuterol HFA 108 (90 Base) MCG/ACT inhaler,  INHALE 2 PUFFS INTO THE LUNGS EVERY 6 HOURS AS NEEDED FOR WHEEZING OR SHORTNESS OF BREATH, Disp: 8.5 g, Rfl: 11  cholecalciferol (VITAMIN D3) 2000 UNIT TABS tablet, Take 1 tablet by mouth in the morning., Disp: 90 tablet, Rfl: 3  acetaminophen (TYLENOL) 500 MG tablet, Take 500 mg by mouth every 6 (six) hours as needed for Pain, Disp: , Rfl:   ipratropium-albuterol (DUO-NEB) 0.5-2.5 (3) MG/3ML SOLN Inhalation Solution, Take 3 mLs by nebulization 4 (four) times daily., Disp: 180 mL, Rfl: 0    No current facility-administered medications on file prior to visit.          12/19/2021     8:27 AM 01/12/2019    10:47 AM 06/01/2018     5:02 PM   PHQ-9 TOTAL SCORE   Doc FlowSheet Total Score '17 10 6   '$ MyChart Total Score 17 (BPA)             PHYSICAL EXAMINATION via video:   General appearance - healthy female in no distress  Neuro - nonfocal  Affect - normal  Behavior without evidence of intoxication or impairment    PMP Reviewed.  No unauthorized prescriptions since last refill.     Assessment:  Opioid Dependence  Comment: Last UDS +unprescribed benzodiazepines, consistent for buprenorphine. States she is taking illicit benzodiazepines for sleep and not interested in alternatives, aware of risks. Continues to benefit from suboxone in  harm reduction framework.   Plan:  Continue buprenorphine, reviewed criteria for tapering when patient is ready. Discussed sources of support.  Patient is counseled regarding relapse prevention, involvement in recovery groups, and potential side effects of buprenorphine.    For the patient participating in the group via telephone and/or video technology: the patient/guardian has verbally consented to participate in this group therapy session by televisit. The patient/guardian was encouraged to be in a private location due to personal health information being discussed and where they could not be overheard by individuals not participating in the group therapy. Although all [facility] staff/providers  participating in this group therapy televisit are in a private location, all the risks of telephone and/or video technology used to conduct this group therapy session cannot be controlled by [place], (i.e. interruptions, unauthorized access/recording and technical difficulties).  Each patient understands that the visit can be stopped at any time for any reason, including if the conferencing connections are inadequate.     Julieta Gutting, MD, 02/25/2022

## 2022-03-06 ENCOUNTER — Ambulatory Visit (HOSPITAL_BASED_OUTPATIENT_CLINIC_OR_DEPARTMENT_OTHER): Payer: 59

## 2022-03-10 ENCOUNTER — Other Ambulatory Visit (HOSPITAL_BASED_OUTPATIENT_CLINIC_OR_DEPARTMENT_OTHER): Payer: 59

## 2022-03-10 ENCOUNTER — Inpatient Hospital Stay (HOSPITAL_BASED_OUTPATIENT_CLINIC_OR_DEPARTMENT_OTHER): Admit: 2022-03-10 | Payer: 59

## 2022-03-11 ENCOUNTER — Ambulatory Visit (HOSPITAL_BASED_OUTPATIENT_CLINIC_OR_DEPARTMENT_OTHER): Payer: 59 | Admitting: Family Medicine

## 2022-03-11 ENCOUNTER — Other Ambulatory Visit (HOSPITAL_BASED_OUTPATIENT_CLINIC_OR_DEPARTMENT_OTHER): Payer: Self-pay | Admitting: Family Medicine

## 2022-03-11 DIAGNOSIS — F1120 Opioid dependence, uncomplicated: Secondary | ICD-10-CM

## 2022-03-11 MED ORDER — BUPRENORPHINE HCL-NALOXONE HCL 8-2 MG SL FILM
ORAL_FILM | SUBLINGUAL | 0 refills | Status: DC
Start: 2022-03-11 — End: 2022-03-25

## 2022-03-13 ENCOUNTER — Other Ambulatory Visit (HOSPITAL_BASED_OUTPATIENT_CLINIC_OR_DEPARTMENT_OTHER): Payer: Self-pay | Admitting: Internal Medicine

## 2022-03-13 DIAGNOSIS — N952 Postmenopausal atrophic vaginitis: Secondary | ICD-10-CM

## 2022-03-13 MED ORDER — DOCUSATE SODIUM 100 MG PO CAPS
100.0000 mg | ORAL_CAPSULE | Freq: Two times a day (BID) | ORAL | 3 refills | Status: DC
Start: 2022-03-13 — End: 2022-03-25

## 2022-03-13 MED ORDER — ESTRADIOL 0.1 MG/GM VA CREA
TOPICAL_CREAM | VAGINAL | 0 refills | Status: DC
Start: 2022-03-13 — End: 2022-06-06

## 2022-03-13 NOTE — Telephone Encounter (Signed)
[facility], Cheryl Dixon is a 66 year old female has requested a refill of      -  2 rx's       Last Office Visit: 02/25/2022 with paduano c  Last Physical Exam: 05/27/2013     There are no preventive care reminders to display for this patient.     Other Med Adult:  Most Recent BP Reading(s)  02/19/22 : 134/87        Cholesterol (mg/dL)   Date Value   12/12/2021 222     LOW DENSITY LIPOPROTEIN DIRECT (mg/dL)   Date Value   12/12/2021 160     HIGH DENSITY LIPOPROTEIN (mg/dL)   Date Value   12/12/2021 44     TRIGLYCERIDES (mg/dL)   Date Value   12/12/2021 119         THYROID SCREEN TSH REFLEX FT4 (uIU/mL)   Date Value   01/12/2019 1.630         No results found for: "TSH"    HEMOGLOBIN A1C (%)   Date Value   12/12/2021 5.7 (H)       No results found for: "POCA1C"      INR (no units)   Date Value   12/12/2021 1.1   02/16/2007 1.0 (L)   07/03/2006 < 1.0 (L)       SODIUM (mmol/L)   Date Value   12/12/2021 140       POTASSIUM (mmol/L)   Date Value   12/12/2021 4.2           CREATININE (mg/dL)   Date Value   12/12/2021 1.0        Documented patient preferred pharmacies:      [facility], [place], [place] [facility] [place]  Phone: [phone] Fax: [phone]

## 2022-03-18 ENCOUNTER — Telehealth (HOSPITAL_BASED_OUTPATIENT_CLINIC_OR_DEPARTMENT_OTHER): Payer: Self-pay

## 2022-03-18 MED ORDER — BISACODYL 5 MG PO TBEC
40.00 mg | DELAYED_RELEASE_TABLET | ORAL | 0 refills | Status: AC
Start: 2022-03-18 — End: 2023-03-18

## 2022-03-18 NOTE — Progress Notes (Signed)
Called pt to confirm procedure on 04/11/22.  Pt has ride home arranged and understands prep with no questions.  Standard Prep with Dulcolax instructions sent via mail and RX info sent.

## 2022-03-25 ENCOUNTER — Ambulatory Visit: Payer: 59 | Admitting: Internal Medicine

## 2022-03-25 ENCOUNTER — Ambulatory Visit: Payer: 59 | Attending: Family Medicine | Admitting: Family Medicine

## 2022-03-25 ENCOUNTER — Encounter (HOSPITAL_BASED_OUTPATIENT_CLINIC_OR_DEPARTMENT_OTHER): Payer: Self-pay | Admitting: Internal Medicine

## 2022-03-25 DIAGNOSIS — F1120 Opioid dependence, uncomplicated: Secondary | ICD-10-CM | POA: Diagnosis not present

## 2022-03-25 DIAGNOSIS — K5903 Drug induced constipation: Secondary | ICD-10-CM

## 2022-03-25 DIAGNOSIS — I6330 Cerebral infarction due to thrombosis of unspecified cerebral artery: Secondary | ICD-10-CM

## 2022-03-25 DIAGNOSIS — N3281 Overactive bladder: Secondary | ICD-10-CM | POA: Insufficient documentation

## 2022-03-25 MED ORDER — DOCUSATE SODIUM 100 MG PO CAPS
100.0000 mg | ORAL_CAPSULE | Freq: Two times a day (BID) | ORAL | 3 refills | Status: DC
Start: 2022-03-25 — End: 2023-04-28

## 2022-03-25 MED ORDER — ASPIRIN 81 MG PO CHEW
81.0000 mg | CHEWABLE_TABLET | Freq: Every day | ORAL | 3 refills | Status: DC
Start: 2022-03-25 — End: 2023-04-10

## 2022-03-25 NOTE — Assessment & Plan Note (Signed)
She is out of colace -- refill sent.  Bowel movement every other day.  I encouraged her to take miralax again every day and she is planning to do that.  OK to reduce to half-cup if too frequent bowel movements but better to take every day.

## 2022-03-25 NOTE — Assessment & Plan Note (Signed)
Excellent response to solneficin.    However patient received letter that her insurance won't cover 30 per month.  She will drop off letter and we may need to do PA.  - continue solneficin for now

## 2022-03-25 NOTE — Assessment & Plan Note (Addendum)
Minimal residual.  Stopped taking ASA, thought it was only for 21 days so she stopped taking it when she stopped plavix.  - important to stay on lifelong ASA  - continue atorvastatin  - still needs to complete extended cardiac monitor + echo with bubble study

## 2022-03-25 NOTE — Progress Notes (Signed)
Subjective     Cheryl Dixon is a 66 year old female with COPD, opioid use disorder in remission, depression, recent CVA causing right arm weakness, seen in follow up:    CVA  Small vessel thrombotic stroke of left corona radiata.  At last visit had residual minimal residual right arm weakness and loss of fine motor control in the right hand.  Feels like she is doing really well.  Stopped taking ASA when she stopped Plavix  Taking atorvastatin every day.  Holter: not done.  Neuro at [place]:  continue ASA 81 at high dose statin, complete echo.    Constipation  At last visit she was advised to increased miralax to tid for a few days, use and enema, and drink more water.  Didn't get colace recently.  Having a bowel movement every other day.    Urinary urgency  Solneficin has helped a lot.  Has no more flank pain.  This has been a big improvement in her quality of life.  [employer] is helping her out.    Social History    Tobacco Use      Smoking status: Former        Packs/day: 1.00        Years: 32.00        Additional pack years: 0.00        Total pack years: 32.00        Types: Cigarettes        Quit date: 09/26/2021        Years since quitting: 0.4      Smokeless tobacco: Never      Tobacco comments: quit 1980-1996, then light until 2003, then 1 ppd since    Alcohol use: No    Drug use: Yes      Types: Marijuana      Comment: past opioids, nasal heroin, 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  MJ 1x per week    Patient Active Problem List:     Opioid dependence on agonist therapy (HCC)     Tobacco use disorder     Fibrocystic breast     Elevated BP without diagnosis of hypertension     Knee pain     Chronic low back pain     OA (osteoarthritis) of knee     H. pylori infection     Major depressive disorder, recurrent episode, severe (HCC)     Occult blood in stools     Prediabetes     Epigastric pain     Chronic obstructive pulmonary disease (HCC)     Left foot pain     Multiple polyps of colon     Acquired deformity of toe, right      Dislocation of metatarsophalangeal joint of lesser toe, right, sequela     Postmenopausal atrophic vaginitis     Abnormal loss of weight     Aneurysm of ascending aorta without rupture ([place])     COVID-19 virus infection     BMI 32.0-32.9,adult     Dysuria     LLQ pain     Osteoporosis     History of endometriosis     Lung cancer screening     Constipation, drug induced     Weakness     Cerebrovascular accident (CVA) ([place])    buprenorphine-naloxone (SUBOXONE) 8-2 MG sublingual film, Take 2.5 films under the tongue daily., Disp: 35 Film, Rfl: 0  aspirin 81 MG chewable tablet, Take 1 tablet by mouth in the  morning., Disp: 30 tablet, Rfl: 0  atorvastatin (LIPITOR) 80 MG tablet, Take 1 tablet by mouth Daily after dinner, Disp: 30 tablet, Rfl: 0  clopidogrel (PLAVIX) 75 MG tablet, Take 1 tablet by mouth in the morning for 20 days., Disp: 20 tablet, Rfl: 0  solifenacin (VESICARE) 10 MG tablet, Take 1 tablet by mouth in the morning., Disp: , Rfl:   estradiol (ESTRACE) 0.1 MG/GM vaginal cream, PLACE 5 G VAGINALLY 3 TIMES A WEEK MYLAN BRAND ONLY., Disp: 170 g, Rfl: 0  polyethylene glycol (GOLYTELY) 236 g suspension, Take 4,000 mLs by mouth See Admin Instructions Take as directed prior to your colonoscopy, Disp: 4000 mL, Rfl: 0  Simethicone 80 MG TABS, Take 4 tablets by mouth See Admin Instructions Take as directed prior to colonoscopy., Disp: 4 tablet, Rfl: 0  polyethylene glycol (GLYCOLAX/MIRALAX) 17 g packet, Take 1 packet by mouth in the morning., Disp: 90 packet, Rfl: 3  risedronate (ACTONEL) 150 MG tablet, Take 1 tablet by mouth every 30 (thirty) days with full glass of water on empty stomach-nothing else by mouth and do not lie down for 1/2 hour, Disp: 3 tablet, Rfl: 3  tiotropium-olodaterol (STIOLTO RESPIMAT) 2.5-2.5 MCG/ACT inhal, Inhale 2 puffs into the lungs in the morning., Disp: 1 each, Rfl: 5  EC-NAPROXEN 500 MG EC tablet, TAKE 1 TABLET BY MOUTH IN THE MORNING AND IN THE EVENING WITH MEALS, Disp: 60 tablet, Rfl:  2  albuterol HFA 108 (90 Base) MCG/ACT inhaler, INHALE 2 PUFFS INTO THE LUNGS EVERY 6 HOURS AS NEEDED FOR WHEEZING OR SHORTNESS OF BREATH, Disp: 8.5 g, Rfl: 11  cholecalciferol (VITAMIN D3) 2000 UNIT TABS tablet, Take 1 tablet by mouth in the morning., Disp: 90 tablet, Rfl: 3  INCRUSE ELLIPTA 62.5 MCG/INH inhaler, INHALE 1 PUFF BY MOUTH INTO LUNGS DAILY, Disp: 30 each, Rfl: 11  buPROPion (WELLBUTRIN SR) 200 MG 12 hr tablet, Take 1 tablet by mouth in the morning and 1 tablet before bedtime., Disp: 180 tablet, Rfl: 3  acetaminophen (TYLENOL) 500 MG tablet, Take 500 mg by mouth every 6 (six) hours as needed for Pain, Disp: , Rfl:   ipratropium-albuterol (DUO-NEB) 0.5-2.5 (3) MG/3ML SOLN Inhalation Solution, Take 3 mLs by nebulization 4 (four) times daily., Disp: 180 mL, Rfl: 0    No current facility-administered medications for this visit.    Review of Patient's Allergies indicates:   Darvon                     Meperidine hcl              Comment:Nausea/vomit   Paxil [paroxetine]      Rash   Zoloft [sertraline *    Rash            Objective     No physical exam done on this virtual visit during the [place] pandemic  Patient was unable to connect to video via [facility] link  On telephone, patient is alert and in no distress  Speech is clear  Speaking in full sentences  Thought content is normal             Plan   Cerebrovascular accident (CVA) ([place])  Minimal residual.  Stopped taking ASA, thought it was only for 21 days so she stopped taking it when she stopped plavix.  - important to stay on lifelong ASA  - continue atorvastatin  - still needs to complete extended cardiac monitor + echo with bubble study    Overactive bladder  Excellent response to solneficin.    However patient received letter that her insurance won't cover 30 per month.  She will drop off letter and we may need to do PA.  - continue solneficin for now    Constipation, drug induced  She is out of colace -- refill sent.  Bowel movement every other day.  I  encouraged her to take miralax again every day and she is planning to do that.  OK to reduce to half-cup if too frequent bowel movements but better to take every day.        I spent a total of 45 minutes on this visit on the date of service (total time includes all activities performed on the date of service)    We discussed the patient's current medications. The patient expressed understanding and no barriers to adherence were identified.  1. The patient indicates understanding of these issues and agrees with the plan.  Brief care plan is updated and reviewed with the patient.   2. The patient is given an After Visit Summary sheet that lists all medications with directions, allergies, orders placed during this encounter, and follow-up instructions.   3. I reviewed the patient's medical information and medical history   4. I reconciled the patient's medication list and prepared and supplied needed refills.   5. I have reviewed the past medical, family, and social history sections including the medications and allergies.    [name], MD

## 2022-03-25 NOTE — Progress Notes (Signed)
Cheryl Dixon is a 66 year old female seen in follow up for opioid use disorder:    Buprenorphine dose: '20mg'$   Response, adequacy of dose: good  Relapses/close calls: none  Trigger: denies  Meetings: none currently    Social history/events:  Dealing with residual effects of recent CVA  Sometimes words don't come out easily or she takes a step "before her brain takes a step"  Seeing PCP today for follow up of this (see televisit also dated today 03/25/2022)  Otherwise she is doing well    Present Medications:  docusate sodium (COLACE) 100 MG capsule, Take 1 capsule by mouth in the morning and 1 capsule before bedtime., Disp: 180 capsule, Rfl: 3  aspirin 81 MG chewable tablet, Take 1 tablet by mouth in the morning., Disp: 90 tablet, Rfl: 3  bisacodyl (DULCOLAX) 5 MG EC tablet, Take 8 tablets by mouth See Admin Instructions Take as directed prior to colonoscopy, Disp: 8 tablet, Rfl: 0  estradiol (ESTRACE) 0.1 MG/GM vaginal cream, PLACE 5 G VAGINALLY 3 TIMES A WEEK MYLAN BRAND ONLY., Disp: 170 g, Rfl: 0  [DISCONTINUED] docusate sodium (COLACE) 100 MG capsule, Take 1 capsule by mouth in the morning and 1 capsule before bedtime., Disp: 180 capsule, Rfl: 3  buPROPion (WELLBUTRIN SR) 200 MG 12 hr tablet, Take 1 tablet by mouth in the morning and 1 tablet before bedtime., Disp: 180 tablet, Rfl: 3  atorvastatin (LIPITOR) 80 MG tablet, Take 1 tablet by mouth Daily after dinner, Disp: 90 tablet, Rfl: 3  [DISCONTINUED] aspirin 81 MG chewable tablet, Take 1 tablet by mouth in the morning., Disp: 90 tablet, Rfl: 3  solifenacin (VESICARE) 10 MG tablet, Take 1 tablet by mouth in the morning., Disp: , Rfl:   polyethylene glycol (GOLYTELY) 236 g suspension, Take 4,000 mLs by mouth See Admin Instructions Take as directed prior to your colonoscopy, Disp: 4000 mL, Rfl: 0  Simethicone 80 MG TABS, Take 4 tablets by mouth See Admin Instructions Take as directed prior to colonoscopy., Disp: 4 tablet, Rfl: 0  polyethylene glycol  (GLYCOLAX/MIRALAX) 17 g packet, Take 1 packet by mouth in the morning., Disp: 90 packet, Rfl: 3  risedronate (ACTONEL) 150 MG tablet, Take 1 tablet by mouth every 30 (thirty) days with full glass of water on empty stomach-nothing else by mouth and do not lie down for 1/2 hour, Disp: 3 tablet, Rfl: 3  tiotropium-olodaterol (STIOLTO RESPIMAT) 2.5-2.5 MCG/ACT inhal, Inhale 2 puffs into the lungs in the morning., Disp: 1 each, Rfl: 5  EC-NAPROXEN 500 MG EC tablet, TAKE 1 TABLET BY MOUTH IN THE MORNING AND IN THE EVENING WITH MEALS, Disp: 60 tablet, Rfl: 2  albuterol HFA 108 (90 Base) MCG/ACT inhaler, INHALE 2 PUFFS INTO THE LUNGS EVERY 6 HOURS AS NEEDED FOR WHEEZING OR SHORTNESS OF BREATH, Disp: 8.5 g, Rfl: 11  cholecalciferol (VITAMIN D3) 2000 UNIT TABS tablet, Take 1 tablet by mouth in the morning., Disp: 90 tablet, Rfl: 3  acetaminophen (TYLENOL) 500 MG tablet, Take 500 mg by mouth every 6 (six) hours as needed for Pain, Disp: , Rfl:   ipratropium-albuterol (DUO-NEB) 0.5-2.5 (3) MG/3ML SOLN Inhalation Solution, Take 3 mLs by nebulization 4 (four) times daily., Disp: 180 mL, Rfl: 0    No current facility-administered medications on file prior to visit.          12/19/2021     8:27 AM 01/12/2019    10:47 AM 06/01/2018     5:02 PM   PHQ-9 TOTAL SCORE  Doc FlowSheet Total Score '17 10 6   '$ MyChart Total Score 17 (BPA)             PHYSICAL EXAMINATION via video:   General appearance - healthy female in no distress  Neuro - nonfocal  Affect - normal  Behavior without evidence of intoxication or impairment    PMP Reviewed.  No unauthorized prescriptions since last refill.     Assessment:  Opioid Dependence  Comment: Last UDS +unprescribed benzodiazepines, consistent for buprenorphine. States she is taking illicit benzodiazepines for sleep and not interested in alternatives, aware of risks. Continues to benefit from suboxone in harm reduction framework.   Plan:  Continue buprenorphine, reviewed criteria for tapering when patient  is ready. Discussed sources of support.  Patient is counseled regarding relapse prevention, involvement in recovery groups, and potential side effects of buprenorphine.    Patient also seen for below problems today (see PCP televisit note also dated today) ; total 45 minutes on today's date of services:  Cerebrovascular accident (CVA) ([place])  Overactive bladder  Constipation, drug induced  - See PCP note for assessment and plan    For the patient participating in the group via telephone and/or video technology: the patient/guardian has verbally consented to participate in this group therapy session by televisit. The patient/guardian was encouraged to be in a private location due to personal health information being discussed and where they could not be overheard by individuals not participating in the group therapy. Although all [facility] staff/providers participating in this group therapy televisit are in a private location, all the risks of telephone and/or video technology used to conduct this group therapy session cannot be controlled by [place], (i.e. interruptions, unauthorized access/recording and technical difficulties).  Each patient understands that the visit can be stopped at any time for any reason, including if the conferencing connections are inadequate.     Julieta Gutting, MD, 03/25/2022

## 2022-04-08 ENCOUNTER — Ambulatory Visit (HOSPITAL_BASED_OUTPATIENT_CLINIC_OR_DEPARTMENT_OTHER): Payer: 59 | Admitting: Family Medicine

## 2022-04-08 ENCOUNTER — Other Ambulatory Visit (HOSPITAL_BASED_OUTPATIENT_CLINIC_OR_DEPARTMENT_OTHER): Payer: Self-pay | Admitting: Family Medicine

## 2022-04-08 DIAGNOSIS — F1120 Opioid dependence, uncomplicated: Secondary | ICD-10-CM

## 2022-04-08 MED ORDER — BUPRENORPHINE HCL-NALOXONE HCL 8-2 MG SL FILM
ORAL_FILM | SUBLINGUAL | 0 refills | Status: DC
Start: 2022-04-08 — End: 2022-04-15

## 2022-04-11 ENCOUNTER — Encounter (HOSPITAL_BASED_OUTPATIENT_CLINIC_OR_DEPARTMENT_OTHER): Payer: Self-pay | Admitting: Student in an Organized Health Care Education/Training Program

## 2022-04-11 ENCOUNTER — Ambulatory Visit (HOSPITAL_BASED_OUTPATIENT_CLINIC_OR_DEPARTMENT_OTHER): Payer: Self-pay | Admitting: Student in an Organized Health Care Education/Training Program

## 2022-04-11 ENCOUNTER — Other Ambulatory Visit: Payer: Self-pay

## 2022-04-11 ENCOUNTER — Ambulatory Visit
Admission: RE | Admit: 2022-04-11 | Discharge: 2022-04-11 | Disposition: A | Discharge status: Home / Self Care | Payer: 59 | Attending: Internal Medicine | Admitting: Internal Medicine

## 2022-04-11 ENCOUNTER — Encounter (HOSPITAL_BASED_OUTPATIENT_CLINIC_OR_DEPARTMENT_OTHER): Payer: Self-pay

## 2022-04-11 DIAGNOSIS — Z86010 Personal history of colonic polyps: Secondary | ICD-10-CM | POA: Insufficient documentation

## 2022-04-11 DIAGNOSIS — K5730 Diverticulosis of large intestine without perforation or abscess without bleeding: Secondary | ICD-10-CM | POA: Diagnosis not present

## 2022-04-11 DIAGNOSIS — K635 Polyp of colon: Secondary | ICD-10-CM

## 2022-04-11 DIAGNOSIS — Z87891 Personal history of nicotine dependence: Secondary | ICD-10-CM | POA: Insufficient documentation

## 2022-04-11 DIAGNOSIS — Z1211 Encounter for screening for malignant neoplasm of colon: Secondary | ICD-10-CM | POA: Diagnosis present

## 2022-04-11 DIAGNOSIS — J449 Chronic obstructive pulmonary disease, unspecified: Secondary | ICD-10-CM | POA: Diagnosis not present

## 2022-04-11 DIAGNOSIS — K649 Unspecified hemorrhoids: Secondary | ICD-10-CM | POA: Diagnosis not present

## 2022-04-11 DIAGNOSIS — K648 Other hemorrhoids: Secondary | ICD-10-CM | POA: Insufficient documentation

## 2022-04-11 MED ORDER — PROPOFOL 500 MG/50 ML IV
Freq: Once | INTRAVENOUS | Status: DC | PRN
Start: 2022-04-11 — End: 2022-04-11
  Administered 2022-04-11: 20 mg via INTRAVENOUS

## 2022-04-11 MED ORDER — LACTATED RINGERS IV BOLUS
INTRAVENOUS | Status: DC | PRN
Start: 2022-04-11 — End: 2022-04-11

## 2022-04-11 MED ORDER — PROPOFOL 200 MG/20 ML IV - AN
INTRAVENOUS | Status: DC | PRN
Start: 2022-04-11 — End: 2022-04-11
  Administered 2022-04-11: 120 ug/kg/min via INTRAVENOUS
  Administered 2022-04-11: 250 ug/kg/min via INTRAVENOUS
  Administered 2022-04-11: 75 ug/kg/min via INTRAVENOUS

## 2022-04-11 MED ORDER — PHENYLEPHRINE HCL-NACL 0.4-0.9 MG/10ML-% IV SOSY
PREFILLED_SYRINGE | Freq: Once | INTRAVENOUS | Status: DC | PRN
Start: 2022-04-11 — End: 2022-04-11
  Administered 2022-04-11: 40 ug via INTRAVENOUS

## 2022-04-11 MED ORDER — LACTATED RINGERS IV SOLN
INTRAVENOUS | Status: DC
Start: 2022-04-11 — End: 2022-04-12

## 2022-04-11 NOTE — PROVATION-GI (Signed)
[facility]  Patient Name: Cheryl Dixon  MRN: [id number]  CSN: [id number]  Date of Birth: [birth year]  Admit Type: Outpatient  Age: 66  Gender: Female  Note Status: Finalized  Patient Location: [name]  Referring MD:          Vincent Peyer MD, MD  Procedure Date:        04/11/2022 8:43:43 AM  Procedure:             Colonoscopy  Endoscopist:           Junius Argyle MD, MD  Indications for Procedure:       High risk colon cancer surveillance: Personal history of colonic polyps  Medications:           Monitored Anesthesia Care  Procedure:       Just prior to the procedure, an updated history and physical was done. I        obtained an informed consent from the patient reviewing the risk of the        procedure including (but not limited to) respiratory depression, perforation,        bleeding, discomfort, a possible need for surgery and unexpected reactions to        medications. The patient is aware that test has limitations and may not        detect significant lesions such as cancer or other potential diseases. The        patient was also informed that they might need a repeat colonoscopy earlier        than standard guidelines if there are changes in their symptoms or concerning        findings noted. A time out was performed with the entire procedure staff        present. The scope was passed under direct vision. Throughout the procedure,        the patient's blood pressure, pulse, and oxygen saturations were monitored        continuously. The Colonoscope was introduced through the anus and advanced to        the cecum, identified by appendiceal orifice and ileocecal valve. The scope        was then slowly withdrawn with confirmation of the noted findings. The        colonoscopy was performed without difficulty. The patient tolerated the        procedure well. The quality of the bowel preparation was unsatisfactory. The        total duration of the procedure was 19 minutes. Scope withdrawal time was 8         minutes.  Findings:       Diverticula were found in the sigmoid colon.       Hemorrhoids were found during retroflexion.       The exam was otherwise without abnormality, but stools could have concealed        polyps..  Scope Withdrawal Time: 0 hours 8 minutes 31 seconds   Post Procedure Diagnosis:       - Preparation of the colon was unsatisfactory.       - Diverticulosis in the sigmoid colon.       - Hemorrhoids.       - The examination was otherwise normal.       - No specimens collected.  Complications:         No immediate complications.  Estimated Blood Loss:  Estimated blood loss: none.  Recommendation:       -  Repeat colonoscopy in 2 years for surveillance.  Junius Argyle MD, MD  04/11/2022 9:25:35 AM  This report has been signed electronically.  Number of Addenda: 0  Note Initiated On: 04/11/2022 8:43 AM

## 2022-04-11 NOTE — Anesthesia Postprocedure Evaluation (Signed)
Anesthesia Post-Operative Evaluation Note    Patient: Cheryl Dixon           Procedure Summary       Date: 04/11/22 Room / Location: [facility] - Gastroenterology    Anesthesia Start: [phone] Anesthesia Stop: 0928    Procedure: COLONOSCOPY Diagnosis: Multiple polyps of sigmoid colon    Scheduled Providers: Junius Argyle, MD; Floy Sabina, MD Responsible Provider: Floy Sabina, MD    Anesthesia Type: MAC ASA Status: 3              POST-OPERATIVE EVALUATION    Anesthesia Post Evaluation    Vitals signs in patient's normal range: Yes  Respiratory function stable; airway patent: Yes  Cardiovascular function stable: Yes  Hydration status stable: Yes  Mental status recovered; patient participates in evaluation and/or is at baseline: Yes  Pain control satisfactory: Yes  Nausea and vomiting control satisfactory: Yes  PostOP disposition PACU  Anesthesia Observation no significant observation    MIPS#404 Anesthesiology Smoking Abstinence:     The patient is a current smoker (e.g. cigarette, cigar, pipe, e-cigarette/vaping/marijuana) (O1308): Yes   The patient underwent an elective surgery or procedure requiring anesthesia (M5784) : Yes   The patient smoked the day of the procedure (O9629): No  FOR CODING USE ONLY: IF BLANK X0404    MIPS#477 Multimodal Pain Management:  Emergent - Exclusion case: No  FOR CODING USE ONLY: REASON NOT LISTED B2841    LKGM#010 Perioperative Temperature Management:  Anesthesia start to Anesthesia end time was 60 minutes or longer (4256F): No   FOR CODING USE ONLY: REASON NOT LISTED U7253    MIPS#430 Adult Prevention of PONV:  Patient received an inhalational anesthetic (GU440): No  FOR CODING USE ONLY: REASON NOT LISTED H4742    MIPS#463 Pediatric Prevention of PONV:  Pediatric patient?: No  FOR CODING USE ONLY: REASON NOT LISTED V9563        [employer] # [id number]        Last vitals    BP: (!) 141/92 (04/11/2022  7:54 AM)  Temp: 98.3 F (36.8 C) (04/11/2022   9:25 AM)  Pulse: 54 (04/11/2022  9:25 AM)  Resp: 17 (04/11/2022  9:25 AM)  SpO2: 99 % (04/11/2022  9:25 AM)

## 2022-04-11 NOTE — Anesthesia Preprocedure Evaluation (Addendum)
Pre-Anesthetic Note  .      Patient: Cheryl Dixon is a 66 year old female      Procedure Information       Date/Time: 04/11/22 0850    Scheduled providers: Junius Argyle, MD; Floy Sabina, MD    Procedure: COLONOSCOPY    Diagnosis: Multiple polyps of sigmoid colon [K63.5]    Location: [facility] - Gastroenterology            Relevant Problems   No relevant active problems       Expand All Collapse All    Pre-Anesthetic Note  .        Patient: Cheryl Dixon is a 66 year old female           Procedure Information      Date/Time: 12/06/20 1310     Scheduled providers: Izora Gala, MD; Gwenette Greet, MD     Procedure: COLONOSCOPY     Diagnosis:       Adenomatous polyp of colon, unspecified part of colon [D12.6]      Encounter for screening colonoscopy [Z12.11]     Location: [facility] - Gastroenterology             Relevant Problems   No relevant active problems               Previous Anesthetic History:     Past Surgical History:     No date: ANES HRNA REPAIR UPR ABD TABDL RPR DIPHRG HRNA  No date: ANES IPER LOWER ABD W/LAPS RAD HYSTERECTOMY  No date: ENDOMETRIAL ABLTJ THERMAL W/O HYSTEROSCOPIC GID  No date: RPR 1ST INGUN HRNA PRETERM INFT [facility]              Medications       Current Outpatient Medications   Medication Sig    buprenorphine-naloxone (SUBOXONE) 8-2 MG sublingual film Take two and one-half films under the tongue daily.    polyethylene glycol (GOLYTELY) 236 g suspension Take 4,000 mLs by mouth See Admin Instructions Take as directed prior to your colonoscopy    Simethicone 80 MG TABS Take 4 tablets by mouth See Admin Instructions Take as directed prior to colonoscopy.    naproxen (NAPROSYN) 500 MG tablet TAKE 1 TABLET BY MOUTH WITH FOOD EVERY TWELVE HOURS AS NEEDED FOR PAIN    estradiol (ESTRACE VAGINAL) 0.1 MG/GM vaginal cream Place 4 g vaginally 3 (three) times a week    cholecalciferol (VITAMIN D3) 1000 UNIT tablet Take 1,000 Units  by mouth daily    acetaminophen (TYLENOL) 500 MG tablet Take 500 mg by mouth every 6 (six) hours as needed for Pain    umeclidinium (INCRUSE ELLIPTA) 62.5 MCG/INH inhaler INHALE 1 PUFF BY MOUTH INTO LUNGS DAILY    buPROPion (WELLBUTRIN SR) 200 MG 12 hr tablet Take 1 tablet by mouth 2 (two) times daily    doxepin (SINEQUAN) 10 MG capsule Take 1-2 capsules by mouth daily  for 14 days    albuterol (VENTOLIN HFA) 108 (90 Base) MCG/ACT inhaler INHALE 2 PUFFS INTO THE LUNGS EVERY 6 HOURS AS NEEDED FOR WHEEZING OR SHORTNESS OF BREATH    ipratropium-albuterol (DUO-NEB) 0.5-2.5 (3) MG/3ML SOLN Inhalation Solution Take 3 mLs by nebulization 4 (four) times daily.             Current Facility-Administered Medications   Medication    lactated ringers infusion  Allergies:     Review of Patient's Allergies indicates:      Darvon                     Meperidine hcl              Comment:Nausea/vomit   Paxil [paroxetine]      Rash   Zoloft [sertraline *    Rash        Smoking, Alcohol, Drugs:  Social History    Tobacco Use      Smoking status: Current Some Day Smoker        Packs/day: 1.00        Years: 20.00        Pack years: 20        Types: Cigarettes      Smokeless tobacco: Never Used      Tobacco comment: quit 1980-1996, then light until 2003, then 1 ppd since    Alcohol use: No        Drug use:     Types: Marijuana   Comment: past opioids, nasal heroin, 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  MJ 1x per week         PMHx:    Past Medical History:     No date: Anxiety  No date: Arthritis  No date: COPD (chronic obstructive pulmonary disease) (HCC)  No date: Depression  No date: Obese  No date: Substance addiction (HCC)  No date: Varicose veins of lower extremities        Vitals  BP (!) 119/99   Pulse 64   Temp 98.3 F (36.8 C) (Temporal)   Resp 14   Ht _0  (1.549 m)   Wt 88.9 kg (196 lb)   LMP 11/20/1991   SpO2 99%   BMI 37.03 kg/m      Review of Systems           Anesthetic History:   negative anesthesia history ROS                Cardiovascular: Positive for hypertension.  Pulmonary: Positive for COPD and tobacco use.  GU/Renal: Negative for GU/renal diseases.    Hepatic: Skin negative for hepatic disease.    Neurological: Negative for seizures and strokes.  Gastrointestinal: Negative for GERD.  Hematological: Negative for hematological diseases.    Musculoskeletal: Positive for arthralgias and back pain.  Psychiatric: Positive for depression.         On Suboxone   Other:BMI greater than 30 kg/m2       Previous Anesthetic History:   Past Surgical History:  No date: ENDOMETRIAL ABLTJ THERMAL W/O HYSTEROSCOPIC GID  No date: PR ANES HRNA REPAIR UPR ABD TABDL RPR DIPHRG HRNA  No date: PR ANES IPER LOWER ABD W/LAPS RAD HYSTERECTOMY  No date: RPR 1ST INGUN HRNA PRETERM INFT [facility]        Medications  Current Outpatient Medications   Medication Sig    buprenorphine-naloxone (SUBOXONE) 8-2 MG sublingual film Take 2.5 films under the tongue daily. PLEASE COME IN FOR URINE SAMPLE FOR LONGER PRESCRIPTION.    docusate sodium (COLACE) 100 MG capsule Take 1 capsule by mouth in the morning and 1 capsule before bedtime.    aspirin 81 MG chewable tablet Take 1 tablet by mouth in the morning.    bisacodyl (DULCOLAX) 5 MG EC tablet Take 8 tablets by mouth See Admin Instructions Take as directed prior to colonoscopy    estradiol (ESTRACE) 0.1 MG/GM vaginal cream PLACE  5 G VAGINALLY 3 TIMES A WEEK MYLAN BRAND ONLY.    buPROPion (WELLBUTRIN SR) 200 MG 12 hr tablet Take 1 tablet by mouth in the morning and 1 tablet before bedtime.    atorvastatin (LIPITOR) 80 MG tablet Take 1 tablet by mouth Daily after dinner    solifenacin (VESICARE) 10 MG tablet Take 1 tablet by mouth in the morning.    polyethylene glycol (GOLYTELY) 236 g suspension Take 4,000 mLs by mouth See Admin Instructions Take as directed prior to your colonoscopy    Simethicone 80 MG TABS Take 4 tablets by mouth See Admin Instructions Take as directed prior to colonoscopy.    polyethylene glycol  (GLYCOLAX/MIRALAX) 17 g packet Take 1 packet by mouth in the morning.    risedronate (ACTONEL) 150 MG tablet Take 1 tablet by mouth every 30 (thirty) days with full glass of water on empty stomach-nothing else by mouth and do not lie down for 1/2 hour    EC-NAPROXEN 500 MG EC tablet TAKE 1 TABLET BY MOUTH IN THE MORNING AND IN THE EVENING WITH MEALS    albuterol HFA 108 (90 Base) MCG/ACT inhaler INHALE 2 PUFFS INTO THE LUNGS EVERY 6 HOURS AS NEEDED FOR WHEEZING OR SHORTNESS OF BREATH    cholecalciferol (VITAMIN D3) 2000 UNIT TABS tablet Take 1 tablet by mouth in the morning.    acetaminophen (TYLENOL) 500 MG tablet Take 500 mg by mouth every 6 (six) hours as needed for Pain    ipratropium-albuterol (DUO-NEB) 0.5-2.5 (3) MG/3ML SOLN Inhalation Solution Take 3 mLs by nebulization 4 (four) times daily.     Current Facility-Administered Medications   Medication    lactated ringers infusion         Allergies:   Review of Patient's Allergies indicates:   Darvon                     Meperidine hcl              Comment:Nausea/vomit   Paxil [paroxetine]      Rash   Zoloft [sertraline *    Rash    Smoking, Alcohol, Drugs:  Social History    Tobacco Use      Smoking status: Former        Packs/day: 1.00        Years: 32.00        Additional pack years: 0.00        Total pack years: 32.00        Types: Cigarettes        Quit date: 09/26/2021        Years since quitting: 0.5      Smokeless tobacco: Never      Tobacco comments: quit 1980-1996, then light until 2003, then 1 ppd since    Alcohol use: No      Drug use:         Types:   Marijuana      Comment:   past opioids, nasal heroin, 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  MJ 1x per week          PMHx:  Past Medical History:  No date: Anxiety  No date: Arthritis  No date: COPD (chronic obstructive pulmonary disease) (HCC)  No date: Depression  No date: Obese  No date: Substance addiction ([place])  No date: Varicose veins of lower extremities    Vitals  BP (!) 141/92   Pulse 85   Temp 98.1 F (36.7 C)  (Temporal)  Resp 15   Ht 5' (1.524 m)   Wt 86.2 kg (190 lb)   LMP 11/20/1991   SpO2 97%   BMI 37.11 kg/m     Anesthesia Evaluation    Physical Exam    General     Level of consciousness:  Alert and oriented (time, person, place)   BMI   BMI greater than 30 kg/m2   Airway     Mallampati:  II    TM distance:  >3 FB    Mouth opening:  >3 FB    Neck ROM:  Full   Teeth    (+) upper dentures  }   Heart   - normal exam     Lungs - normal exam                   Pertinent Labs:   Lab Results   Component Value Date    NA 140 12/12/2021    K 4.2 12/12/2021    CREAT 1.0 12/12/2021    GLUCOSER 107 12/12/2021    WBC 7.1 12/12/2021    HCT 42.2 12/12/2021    PLTA 236 12/12/2021    PT 12.2 12/12/2021    APTT 26.1 02/16/2007    INR 1.1 12/12/2021         Anesthesia Plan    ASA Score:     ASA:  3    Airway:      Mallampati:  II    Mouth opening:  >3 FB    Neck ROM:  Full    TM distance:  >3 FB     Plan: MAC    Other information:     EKG Reviewed: : Yes      Full Stomach Precaution:: No      Post-Plan::  PACU    Informed Consent:     Anesthetic plan and risks discussed with:  Patient   Patient Consented        Attending Anesthesiologist Statement:     Reassessed day of surgery: Yes        Assessment made, necessary equipment and appropriate plan in place.
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
## 2022-04-15 ENCOUNTER — Telehealth (HOSPITAL_BASED_OUTPATIENT_CLINIC_OR_DEPARTMENT_OTHER): Payer: Self-pay | Admitting: Family Medicine

## 2022-04-15 DIAGNOSIS — F1120 Opioid dependence, uncomplicated: Secondary | ICD-10-CM

## 2022-04-15 MED ORDER — BUPRENORPHINE HCL-NALOXONE HCL 8-2 MG SL FILM
ORAL_FILM | SUBLINGUAL | 0 refills | Status: DC
Start: 2022-04-15 — End: 2022-04-22

## 2022-04-15 NOTE — Telephone Encounter (Signed)
Patient erroneously not on schedule for group today  No answer when I called to check in  LVM - Next date she's expected to attend would be 04/29/2022    Julieta Gutting, MD

## 2022-04-22 ENCOUNTER — Other Ambulatory Visit (HOSPITAL_BASED_OUTPATIENT_CLINIC_OR_DEPARTMENT_OTHER): Payer: Self-pay | Admitting: Family Medicine

## 2022-04-22 DIAGNOSIS — F1120 Opioid dependence, uncomplicated: Secondary | ICD-10-CM

## 2022-04-22 MED ORDER — BUPRENORPHINE HCL-NALOXONE HCL 8-2 MG SL FILM
ORAL_FILM | SUBLINGUAL | 0 refills | Status: DC
Start: 2022-04-22 — End: 2022-04-29

## 2022-04-29 ENCOUNTER — Ambulatory Visit: Payer: 59 | Admitting: Clinical

## 2022-04-29 ENCOUNTER — Other Ambulatory Visit (HOSPITAL_BASED_OUTPATIENT_CLINIC_OR_DEPARTMENT_OTHER): Payer: Self-pay | Admitting: Family Medicine

## 2022-04-29 ENCOUNTER — Ambulatory Visit (HOSPITAL_BASED_OUTPATIENT_CLINIC_OR_DEPARTMENT_OTHER): Payer: HMO | Admitting: Family Medicine

## 2022-04-29 DIAGNOSIS — F1120 Opioid dependence, uncomplicated: Secondary | ICD-10-CM

## 2022-04-29 MED ORDER — BUPRENORPHINE HCL-NALOXONE HCL 8-2 MG SL FILM
ORAL_FILM | SUBLINGUAL | 0 refills | Status: DC
Start: 2022-04-29 — End: 2022-05-27

## 2022-04-29 NOTE — Progress Notes (Signed)
Created in error. Please disregard.

## 2022-05-06 ENCOUNTER — Ambulatory Visit (HOSPITAL_BASED_OUTPATIENT_CLINIC_OR_DEPARTMENT_OTHER): Payer: HMO | Admitting: Family Medicine

## 2022-05-13 ENCOUNTER — Ambulatory Visit (HOSPITAL_BASED_OUTPATIENT_CLINIC_OR_DEPARTMENT_OTHER): Payer: 59 | Admitting: Clinical

## 2022-05-13 ENCOUNTER — Ambulatory Visit (HOSPITAL_BASED_OUTPATIENT_CLINIC_OR_DEPARTMENT_OTHER): Payer: HMO | Admitting: Family Medicine

## 2022-05-13 ENCOUNTER — Telehealth (HOSPITAL_BASED_OUTPATIENT_CLINIC_OR_DEPARTMENT_OTHER): Payer: Self-pay | Admitting: Family Medicine

## 2022-05-13 NOTE — Telephone Encounter (Signed)
Patient called because she wanted to apologize for missing group  States she is doing well, wanted to make sure I know she is not "messing around"  Feels good with suboxone  Will try to join group next week  Because she was recently in hospital and they were giving her suboxone, she still has at least a week of suboxone at home doesn't need a refill today.    Julieta Gutting, MD

## 2022-05-27 ENCOUNTER — Telehealth (HOSPITAL_BASED_OUTPATIENT_CLINIC_OR_DEPARTMENT_OTHER): Payer: Self-pay

## 2022-05-27 ENCOUNTER — Telehealth (HOSPITAL_BASED_OUTPATIENT_CLINIC_OR_DEPARTMENT_OTHER): Payer: Self-pay | Admitting: Clinical

## 2022-05-27 ENCOUNTER — Ambulatory Visit (HOSPITAL_BASED_OUTPATIENT_CLINIC_OR_DEPARTMENT_OTHER): Payer: 59 | Admitting: Clinical

## 2022-05-27 ENCOUNTER — Telehealth (HOSPITAL_BASED_OUTPATIENT_CLINIC_OR_DEPARTMENT_OTHER): Payer: Self-pay | Admitting: Internal Medicine

## 2022-05-27 ENCOUNTER — Ambulatory Visit: Payer: 59 | Admitting: Family Medicine

## 2022-05-27 ENCOUNTER — Other Ambulatory Visit (HOSPITAL_BASED_OUTPATIENT_CLINIC_OR_DEPARTMENT_OTHER): Payer: Self-pay | Admitting: Family Medicine

## 2022-05-27 DIAGNOSIS — F1120 Opioid dependence, uncomplicated: Secondary | ICD-10-CM

## 2022-05-27 DIAGNOSIS — N952 Postmenopausal atrophic vaginitis: Secondary | ICD-10-CM

## 2022-05-27 MED ORDER — BUPRENORPHINE HCL-NALOXONE HCL 8-2 MG SL FILM
ORAL_FILM | SUBLINGUAL | 0 refills | Status: DC
Start: 2022-05-27 — End: 2022-06-11

## 2022-05-27 NOTE — Telephone Encounter (Signed)
Cheryl Dixon [id number], 67 year old, female    Calls today:  Clinical Questions ([place])    Name of person calling patient   Specific nature of request call back states having issues to get to video group   Return phone number [phone]     Person calling on behalf of patient: Patient (self)  Patient's language of care: English    Patient does not need an interpreter.    Patient's PCP: [name], MD    Primary Care Home Site:  [facility]

## 2022-05-27 NOTE — Telephone Encounter (Signed)
Returned pt's call. No interpreter needed. Pt was reached. Pt previously called the office and reported technical issues with virtual connection; she was unable to connect with the group and was having a hard time reaching the office because the call center kept connecting her with a wrong extension. We discussed a follow up plan and pt would be interested to attend an in person group.

## 2022-05-27 NOTE — Telephone Encounter (Signed)
Cheryl Dixon [id number], 67 year old, female    Calls today:  Clinical Questions (NON-SICK CLINICAL QUESTIONS ONLY)      Name of person calling pt    Specific nature of request pt calling to request Suboxone 8.'2mg'$  refill and also requested for nurse to please call her.     Return phone number [phone]     Patient's language of care: English    Patient does not need an interpreter.    Patient's PCP: [name], MD    Primary Care Home Site:  [facility]

## 2022-05-27 NOTE — PostOp Call (Signed)
Returned pt's call. No interpreter needed. Pt was reached. Pt previously called the office and reported technical issues with virtual connection; she was unable to connect with the group and was having a hard time reaching the office because the call center kept connecting her with a wrong extension. We discussed a follow up plan and pt would be interested to attend an in person group.

## 2022-05-27 NOTE — Telephone Encounter (Signed)
Cheryl Dixon [id number], 67 year old, female    Calls today:  Clinical Questions ([place])      Name of person calling pt    Specific nature of request pt calling requesting to get a refill for Suboxone 8.'2mg'$     Return phone number [phone]     Patient's language of care: English    Patient does not need an interpreter.    Patient's PCP: [name], MD    Primary Care Home Site:  [facility]

## 2022-05-28 ENCOUNTER — Other Ambulatory Visit (HOSPITAL_BASED_OUTPATIENT_CLINIC_OR_DEPARTMENT_OTHER): Payer: Self-pay | Admitting: Internal Medicine

## 2022-05-28 DIAGNOSIS — K5903 Drug induced constipation: Secondary | ICD-10-CM

## 2022-05-28 MED ORDER — POLYETHYLENE GLYCOL 3350 17 G PO PACK
17.0000 g | PACK | Freq: Every day | ORAL | 3 refills | Status: DC
Start: 2022-05-28 — End: 2023-05-14

## 2022-05-28 MED ORDER — ATORVASTATIN CALCIUM 80 MG PO TABS
80.0000 mg | ORAL_TABLET | Freq: Every day | ORAL | 3 refills | Status: DC
Start: 2022-05-28 — End: 2023-07-08

## 2022-05-28 NOTE — Telephone Encounter (Signed)
Called and spoke with patient,reports she needs refill for the following medications sent in to [facility]:  Miralax   Healthy lax  Estradiol 0.01% vaginal cream  Reports she uses these medications on a daily basis.  Advised,I will let provider know  Reports,all set with suboxone.      Cheryl Dixon [id number], 67 year old, female    Calls today:  Clinical Questions (NON-SICK CLINICAL QUESTIONS ONLY)      Name of person calling pt    Specific nature of request pt calling to request Suboxone 8.'2mg'$  refill and also requested for nurse to please call her.     Return phone number [phone]     Patient's language of care: English    Patient does not need an interpreter.    Patient's PCP: [name], MD    Primary Care Home Site:  [facility]

## 2022-05-28 NOTE — Telephone Encounter (Signed)
Looks like provider sent all medications already except for Estradiol 0.01% vaginal cream which is not in medchart     Please review

## 2022-06-06 ENCOUNTER — Other Ambulatory Visit (HOSPITAL_BASED_OUTPATIENT_CLINIC_OR_DEPARTMENT_OTHER): Payer: Self-pay | Admitting: Internal Medicine

## 2022-06-06 DIAGNOSIS — N952 Postmenopausal atrophic vaginitis: Secondary | ICD-10-CM

## 2022-06-06 MED ORDER — ESTRADIOL 0.1 MG/GM VA CREA
TOPICAL_CREAM | VAGINAL | 0 refills | Status: AC
Start: 2022-06-06 — End: 2022-07-06

## 2022-06-06 MED ORDER — ESTRADIOL 0.1 MG/GM VA CREA
TOPICAL_CREAM | VAGINAL | 0 refills | Status: DC
Start: 2022-06-06 — End: 2022-06-06

## 2022-06-06 NOTE — Telephone Encounter (Signed)
Estradiol cream sent to pharmacy (see other encounter).

## 2022-06-10 ENCOUNTER — Ambulatory Visit (HOSPITAL_BASED_OUTPATIENT_CLINIC_OR_DEPARTMENT_OTHER): Payer: 59 | Admitting: Clinical

## 2022-06-11 ENCOUNTER — Ambulatory Visit: Payer: 59 | Attending: Family Medicine | Admitting: Physician Assistant

## 2022-06-11 ENCOUNTER — Other Ambulatory Visit: Payer: Self-pay

## 2022-06-11 ENCOUNTER — Encounter (HOSPITAL_BASED_OUTPATIENT_CLINIC_OR_DEPARTMENT_OTHER): Payer: Self-pay | Admitting: Physician Assistant

## 2022-06-11 VITALS — BP 118/68 | HR 73 | Temp 96.8°F | Wt 198.0 lb

## 2022-06-11 DIAGNOSIS — J449 Chronic obstructive pulmonary disease, unspecified: Secondary | ICD-10-CM | POA: Insufficient documentation

## 2022-06-11 DIAGNOSIS — F1121 Opioid dependence, in remission: Secondary | ICD-10-CM | POA: Insufficient documentation

## 2022-06-11 DIAGNOSIS — Z1231 Encounter for screening mammogram for malignant neoplasm of breast: Secondary | ICD-10-CM | POA: Insufficient documentation

## 2022-06-11 DIAGNOSIS — F329 Major depressive disorder, single episode, unspecified: Secondary | ICD-10-CM | POA: Insufficient documentation

## 2022-06-11 DIAGNOSIS — R300 Dysuria: Secondary | ICD-10-CM | POA: Insufficient documentation

## 2022-06-11 DIAGNOSIS — M1711 Unilateral primary osteoarthritis, right knee: Secondary | ICD-10-CM | POA: Insufficient documentation

## 2022-06-11 DIAGNOSIS — F1120 Opioid dependence, uncomplicated: Secondary | ICD-10-CM

## 2022-06-11 LAB — URINALYSIS RFLX TO URINE CULT
BILIRUBIN, URINE: NEGATIVE
CASTS: NONE SEEN PER LPF
CRYSTALS: NONE SEEN
GLUCOSE, URINE: NEGATIVE MG/DL
KETONE, URINE: NEGATIVE MG/DL
LEUKOCYTE ESTERASE: NEGATIVE
NITRITE, URINE: NEGATIVE
PH URINE: 6 (ref 5.0–8.0)
PROTEIN, URINE: NEGATIVE MG/DL
SPECIFIC GRAVITY URINE: 1.008 (ref 1.003–1.035)
SQUAMOUS EPITHELIAL CELLS: >10 PER LPF — AB (ref 0–4)
YEAST: <10 PER HPF — AB

## 2022-06-11 LAB — POC URINALYSIS
BILIRUBIN, URINE: NEGATIVE
GLUCOSE,URINE: NEGATIVE
KETONE, URINE: NEGATIVE
LEUKOCYTE ESTERASE: NEGATIVE
NITRITE, URINE: NEGATIVE
PH URINE: 6 (ref 5.0–8.0)
PROTEIN, URINE: NEGATIVE
SPECIFIC GRAVITY, URINE: 1.01 (ref 1.003–1.030)
UROBILINOGEN URINE: 0.2 (ref 0.2–1.0)

## 2022-06-11 LAB — AMPHETAMINES URINE: AMPHETAMINES URINE: NEGATIVE ng/mL

## 2022-06-11 LAB — URINE CULTURE WILL NOT BE DONE

## 2022-06-11 MED ORDER — BUPRENORPHINE HCL-NALOXONE HCL 8-2 MG SL FILM
ORAL_FILM | SUBLINGUAL | 0 refills | Status: DC
Start: 2022-06-11 — End: 2022-06-24

## 2022-06-11 NOTE — Progress Notes (Signed)
Subjective:   CC: Cheryl Dixon is a 67 year old female patient who presents today for knee pain  LANGUAGE: English used effectively by patient.    HISTORY OF PRESENT ILLNESS:  OA R knee  Wants to get knee surgery  Has osteoporosis  Scared for knee surgery - wants to have full CPEX first and make sure she's caught up on health maintenance before doing any surgery  Has not recently met with ortho to discuss surgery/other tx options    "Exam: Right knee, 3 views  Indication: pain and swelling after fall  Comparison: June 14, 2021.  Findings:  Bones and Joints: As noted on the previous examination, there is severe narrowing of the medial compartment of the right knee with subchondral cystic changes along the distal femur and proximal tibia. There is periarticular enthesopathy. There is patellar enthesopathy. No evidence of a suprapatellar effusion.  Impression:   Persistent severe narrowing of the medial compartment of the right knee."    COPD  Flares every now and then  No more smoking  Incruse (uses most often) and albuterol PRN (doesn't use much)    MDD   PHQ9 12 today  Wants to increase meds (already on max dose wellbutrin)  No SI    Dysuria  Chronic  Not every time she goes  +Hx atrophic vaginitis  X 1 mo  No fevers    HM  Wants mammo apt    PAST MEDICAL HISTORY:  Past Medical History:  No date: Anxiety  No date: Arthritis  No date: COPD (chronic obstructive pulmonary disease) (HCC)  No date: Depression  No date: Obese  No date: Substance addiction ([place])  No date: Varicose veins of lower extremities    REVIEW OF SYSTEMS:   See HPI    Objective:     PHYSICAL EXAM:   BP 118/68   Pulse 73   Temp 96.8 F (36 C)   Wt 89.8 kg (198 lb)   LMP 11/20/1991   SpO2 96%   BMI 38.67 kg/m   Gen: Well-appearing, speaking normally, in no acute distress  HEENT: Head atraumatic  CV: regular rate and rhythm  Pulm: No increased work of breathing, lungs clear to auscultation, no wheezes/rales/rhonchi  Neuro: Alert and  oriented, normal gait   Psych: Affect euthymic, normal speech  R knee- +edema generalized, no erythema    Assessment & Plan:   ASSESSMENT AND PLAN:  #Primary osteoarthritis of right knee  Interested in surgical options, will refer to ortho for further eval/discussion of tx options. No s/s infection today  Plan:   - REFERRAL TO ORTHOPEDICS ([facility])  - RTC if sx worsen    #Dysuria  Patient requesting urine dip to r/o UTI, low suspicion for UTI given chronicity of sx, more likely secondary to prior dx atrophic vaginitis, likely will get better with topical estrogen tx patient is already on  Plan:   - URINE DIPSTICK, URINALYSIS RFLX TO URINE CULT  - Continue to monitor sx; if no improvement consider vaginitis panel to r/o other causes of vaginitis    #Chronic obstructive pulmonary disease, unspecified COPD type ([place])  Stable on current meds, normal lung exam today  Plan:   - continue current med regimen    #Major depressive disorder, remission status unspecified, unspecified whether recurrent  PHQ9 =12 today, on max dose wellbutrin, will f/u with PCP to see if adding another med to regimen may be beneficial. No SI  Plan:   - F/u with PCP to  discuss possible med adjustment (messaged schedulers to call pt to schedule PCP f/u)    #Opioid dependence on agonist therapy ([place])  #Opioid dependence in remission ([place])  Due for UDS, completed today. Suboxone Rx filled by Dr. Lisa Roca today  Plan:   - buprenorphine-naloxone (SUBOXONE) 8-2 MG         sublingual film  - AMPHETAMINES URINE, POC URINALYSIS, URINE         CULTURE WILL NOT BE DONE, COCAINE METABOLITES         URINE, OPIATES URINE, OXYCODONE SCREEN URINE,         FENTANYL URINE, BUPRENORPHINE SCREEN URINE,         METHADONE URINE    #Encounter for screening mammogram for malignant neoplasm of breast  Plan:   - [facility] SCREENING MAMMO BILATERAL DIGITAL WITH DBT &        CAD    The patient expressed understanding and no barriers to adherence were identified. Discussed reasons to call  office or seek immediate medical care including: no improvement or worsening of symptoms, new concerns, or questions.     The patient indicates understanding of these issues and agrees with the plan.     I reconciled the patient's medication list and supplied needed refills.     The patient was given an After Visit Summary.    I spent a total of 32 minutes on this visit on the date of service (total time includes all activities performed on the date of service)    Marya Landry, PA-C 06/11/2022

## 2022-06-12 ENCOUNTER — Other Ambulatory Visit (HOSPITAL_BASED_OUTPATIENT_CLINIC_OR_DEPARTMENT_OTHER): Payer: Self-pay | Admitting: Family Medicine

## 2022-06-12 DIAGNOSIS — F1120 Opioid dependence, uncomplicated: Secondary | ICD-10-CM

## 2022-06-12 LAB — FENTANYL URINE: FENTANYL URINE: NEGATIVE ng/mL

## 2022-06-12 LAB — BUPRENORPHINE SCREEN URINE: BUPRENORPHINE SCREEN URINE: POSITIVE ng/mL — AB

## 2022-06-12 LAB — COCAINE METABOLITES URINE: COCAINE METABOLITES URINE: NEGATIVE ng/mL

## 2022-06-12 LAB — METHADONE URINE: METHADONE URINE: NEGATIVE ng/mL

## 2022-06-12 LAB — OXYCODONE SCREEN URINE: OXYCOD SCRN URINE: NEGATIVE ng/mL

## 2022-06-12 LAB — OPIATES URINE: OPIATES URINE: NEGATIVE ng/mL

## 2022-06-16 LAB — BENZODIAZEPINE URINE SCREEN: BENZODIAZEPINES URINE: NEGATIVE ng/mL

## 2022-06-18 ENCOUNTER — Telehealth (HOSPITAL_BASED_OUTPATIENT_CLINIC_OR_DEPARTMENT_OTHER): Payer: Self-pay

## 2022-06-18 NOTE — Progress Notes (Signed)
Dear RN,    Please:    1. Create Telephone encounter for this patient.  2. Share with the patient the attached results   - urine test showed no evidence of UTI, but some yeast, which may indicate that your chronic dysuria could be from a vaginal yeast infection. Please RTC for vaginitis test if symptoms of intermittent dysuria are still present.    2. Type of Outreach: 3 phone calls and if unable to reach send letter    3. Document the conversation in the Telephone Encounter and close the encounter, no need to send back to me.     Thank you,  Marya Landry, PA-C

## 2022-06-18 NOTE — Telephone Encounter (Addendum)
Called and spoke with patient, advised her of message below, she verbalized understanding. Reports she has had this for 2 years, has seen GYN, urology, no one has been able to diagnose this.  Offered to schedule appt, initially agreed, next available on Monday, declined, reports Dr Lisa Roca knows her history, prefers to hear what he suggests.      ----- Message from Marya Landry, PA-C sent at 06/18/2022  7:47 AM EST -----  Dear RN,    Please:    1. Create Telephone encounter for this patient.  2. Share with the patient the attached results   - urine test showed no evidence of UTI, but some yeast, which may indicate that your chronic dysuria could be from a vaginal yeast infection. Please RTC for vaginitis test if symptoms of intermittent dysuria are still present.    2. Type of Outreach: 3 phone calls and if unable to reach send letter    3. Document the conversation in the Telephone Encounter and close the encounter, no need to send back to me.     Thank you,  Marya Landry, PA-C

## 2022-06-19 NOTE — Telephone Encounter (Signed)
I called patient to discuss dysuria. Left VM that I'll cal back.  She saw gyn 02/19/22 and has pelvic floor prolapse which may be a contributing factor.  12/05/21 saw urology at [facility] for urinary frequency and urgency. Treated with solnefecin for overactive bladder.  12/09/21 saw Dr. Wynn Banker and noted that pain was improving since starting this med, but she still has pain with activity. Tender RLQ. No specific therapy since it was getting better.

## 2022-06-20 NOTE — Telephone Encounter (Signed)
Tried to reach patient twice yesterday and was unable.    This is an ongoing chronic problem, and I can try to reach her to discuss it again next week.

## 2022-06-24 ENCOUNTER — Ambulatory Visit: Payer: 59 | Attending: Psychosomatic Medicine | Admitting: Clinical

## 2022-06-24 ENCOUNTER — Encounter (HOSPITAL_BASED_OUTPATIENT_CLINIC_OR_DEPARTMENT_OTHER): Payer: Self-pay

## 2022-06-24 ENCOUNTER — Ambulatory Visit: Payer: 59 | Attending: Family Medicine | Admitting: Family Medicine

## 2022-06-24 DIAGNOSIS — F3341 Major depressive disorder, recurrent, in partial remission: Secondary | ICD-10-CM | POA: Insufficient documentation

## 2022-06-24 DIAGNOSIS — F1120 Opioid dependence, uncomplicated: Secondary | ICD-10-CM

## 2022-06-24 MED ORDER — BUPRENORPHINE HCL-NALOXONE HCL 8-2 MG SL FILM
ORAL_FILM | SUBLINGUAL | 0 refills | Status: DC
Start: 2022-06-24 — End: 2022-07-15

## 2022-06-24 NOTE — Progress Notes (Signed)
Cheryl Dixon is a 67 year old female seen in follow up for opioid use disorder:    Buprenorphine dose: '20mg'$   Response, adequacy of dose: good  Relapses/close calls: none  Trigger: denies  Meetings: none currently    Social history/events:  Doing well from recovery perspective  Having a lot of knee pain  Planning to reschedule her knee surgery  She is scared of the recovery being difficult but really having trouble with pain esp when walking on concrete.    Present Medications:  buprenorphine-naloxone (SUBOXONE) 8-2 MG sublingual film, Take 2.5 films under the tongue daily., Disp: 35 Film, Rfl: 0  estradiol (ESTRACE) 0.1 MG/GM vaginal cream, PLACE 5 G VAGINALLY 3 TIMES A WEEK MYLAN BRAND ONLY., Disp: 170 g, Rfl: 0  atorvastatin (LIPITOR) 80 MG tablet, Take 1 tablet by mouth Daily after dinner, Disp: 90 tablet, Rfl: 3  polyethylene glycol (GLYCOLAX/MIRALAX) 17 g packet, Take 1 packet by mouth in the morning., Disp: 90 packet, Rfl: 3  docusate sodium (COLACE) 100 MG capsule, Take 1 capsule by mouth in the morning and 1 capsule before bedtime., Disp: 180 capsule, Rfl: 3  aspirin 81 MG chewable tablet, Take 1 tablet by mouth in the morning., Disp: 90 tablet, Rfl: 3  bisacodyl (DULCOLAX) 5 MG EC tablet, Take 8 tablets by mouth See Admin Instructions Take as directed prior to colonoscopy, Disp: 8 tablet, Rfl: 0  buPROPion (WELLBUTRIN SR) 200 MG 12 hr tablet, Take 1 tablet by mouth in the morning and 1 tablet before bedtime., Disp: 180 tablet, Rfl: 3  solifenacin (VESICARE) 10 MG tablet, Take 1 tablet by mouth in the morning., Disp: , Rfl:   polyethylene glycol (GOLYTELY) 236 g suspension, Take 4,000 mLs by mouth See Admin Instructions Take as directed prior to your colonoscopy, Disp: 4000 mL, Rfl: 0  Simethicone 80 MG TABS, Take 4 tablets by mouth See Admin Instructions Take as directed prior to colonoscopy., Disp: 4 tablet, Rfl: 0  risedronate (ACTONEL) 150 MG tablet, Take 1 tablet by mouth every 30 (thirty) days with full  glass of water on empty stomach-nothing else by mouth and do not lie down for 1/2 hour, Disp: 3 tablet, Rfl: 3  EC-NAPROXEN 500 MG EC tablet, TAKE 1 TABLET BY MOUTH IN THE MORNING AND IN THE EVENING WITH MEALS, Disp: 60 tablet, Rfl: 2  albuterol HFA 108 (90 Base) MCG/ACT inhaler, INHALE 2 PUFFS INTO THE LUNGS EVERY 6 HOURS AS NEEDED FOR WHEEZING OR SHORTNESS OF BREATH, Disp: 8.5 g, Rfl: 11  cholecalciferol (VITAMIN D3) 2000 UNIT TABS tablet, Take 1 tablet by mouth in the morning., Disp: 90 tablet, Rfl: 3  acetaminophen (TYLENOL) 500 MG tablet, Take 500 mg by mouth every 6 (six) hours as needed for Pain, Disp: , Rfl:   ipratropium-albuterol (DUO-NEB) 0.5-2.5 (3) MG/3ML SOLN Inhalation Solution, Take 3 mLs by nebulization 4 (four) times daily., Disp: 180 mL, Rfl: 0    No current facility-administered medications on file prior to visit.          06/11/2022     4:24 PM 12/19/2021     8:27 AM 01/12/2019    10:47 AM   PHQ-9 TOTAL SCORE   Doc FlowSheet Total Score '12 17 10   '$ MyChart Total Score  17 (BPA)            PHYSICAL EXAMINATION via video:   General appearance - healthy female in no distress  Neuro - nonfocal  Affect - normal  Behavior without evidence of intoxication  or impairment    PMP Reviewed.  No unauthorized prescriptions since last refill.     Assessment:  Opioid Dependence  Comment: Stable in sobriety. Last UDS 06/11/2022 consistent.  Plan:  Continue buprenorphine, reviewed criteria for tapering when patient is ready. Discussed sources of support.  Patient is counseled regarding relapse prevention, involvement in recovery groups, and potential side effects of buprenorphine.    For the patient participating in the group via telephone and/or video technology: the patient/guardian has verbally consented to participate in this group therapy session by televisit. The patient/guardian was encouraged to be in a private location due to personal health information being discussed and where they could not be overheard by  individuals not participating in the group therapy. Although all [facility] staff/providers participating in this group therapy televisit are in a private location, all the risks of telephone and/or video technology used to conduct this group therapy session cannot be controlled by [facility], (i.e. interruptions, unauthorized access/recording and technical difficulties).  Each patient understands that the visit can be stopped at any time for any reason, including if the conferencing connections are inadequate.     Julieta Gutting, MD, 06/24/2022

## 2022-06-28 NOTE — Progress Notes (Signed)
OBOT (OFFICE BASED OPIOID TREATMENT)  GROUP PROGRESS NOTE     Televisit consent, as applicable:  For the patient participating in the group via telephone and/or video technology: The patient/guardian has verbally consented to participate in this group therapy session by televisit. The patient/guardian was encouraged to be in a private location due to personal health information being discussed and where they could not be overheard by individuals not participating in the group therapy. Although all [facility] staff/providers participating in this group therapy televisit are in a private location, all the risks of telephone and/or video technology used to conduct this group therapy session cannot be controlled by [facility], (i.e. interruptions, unauthorized access/recording and technical difficulties).  Each patient understands that the visit can be stopped at any time for any reason, including if the conferencing connections are inadequate.     GROUP NAME:  Office Based Opioid Treatment Group     LEADER(S):  Julieta Gutting, MD, Cherlynn Perches, Ph.D, [name], RN, and Reanne Chilton (T)       Cheryl Dixon is a 67 year old, Divorced, female, who presented for [facility] group visit.   Length of group: 60 min which included 30 min of Health Behavior Intervention and OUD Psychoeducation.     Service Type: [id number] Group Psychotherapy     Group Topic Today:  Anxiety in the context of Relapse Prevention and Recovery Experience - continued. Group members continued to explore anxiety coping strategies. Group members practiced Mindfulness strategies (5 senses and grounding techniques) and cognitive coping exercises. Members shared and discussed their recovery experience and how anxiety may have affected it. Group members shared strategies to reduce relapse risks and increase healthy coping mechanisms to support healthy change.    Number of participants in group today: 10        Purpose of Group (choose all that apply):   Support (psychological,  family, community resources)   Education about Opioid Use Disorder (OUD) and treatment  Enhancing adjustment and coping with OUD  Improving management of OUD and treatment adherence  Supporting Recovery Process  Increasing health promoting behaviors  Decreasing health risk behaviors and risks of relapse  Addressing psychological/psychosocial/behavioral barriers to preventing addiction and relapse  Promotion of functional improvement              Group Process:    Group rules   Review of group goals/structure  Group discussion and sharing.    Review of strategies to increase healthy coping mechanisms to support recovery and change.  Review of additional relapse prevention strategies.   Mindfulness and self-compassion techniques introduced.  Discussion of support   Check in    Individual Patient Participation:  Pt appeared attentive to group process and presented as active participant .  Pt contributed to group discussion about coping skills and performed Mindfulness exercises with the group. Reported doing well recovery-wise but having a lot of knee pain which is hard. Does not get out much. Will plan to reschedule knee surgery, hopefully, in March.   No relapses/close calls disclosed    Did not contribute much, but was attentive to process  Pt was present during the entire group and appeared attentive to process; did not contribute much and did not check in      Medical Necessity of Session (how treatment is necessary to improve symptoms, functioning, or prevent worsening): treatment in necessary to enhance compliance with agonist treatment and maintain abstinence from opioids. Suboxone group participation is mandatory to continue Suboxone prescription and is necessary  in order to enhance coping with Opioid Use Disorder.      Relevant Changes in Mental Status: No.     Risk Level per Scale:  Suicide: low  Violence: low  Addiction: low/moderate    Current risk level represents increase in risk: No.    Reviewing  Today's Visit:  Clinical Interventions Today: Motivational Interviewing; Cognitive Behavioral Therapy (CBT)  Patient's Response to Interventions: Responsive    DIAGNOSES:    Opioid dependence on agonist therapy ([place])  (primary encounter diagnosis)  MDD (major depressive disorder), recurrent, in partial remission ([place])    Plan: Pt will continue OBOT treatment     Cherlynn Perches, Ph.D. .

## 2022-06-28 NOTE — BH OP Treatment Plan (Signed)
Patient/Guardian:  contributed to the creation of the treatment plan.    Strengths/Skills: Help seeking and motivated to get better     Potential Barriers: Multiple life stressors       Patient stated goals: Pt would like to prevent relapses and support her recovery process     Problem 1: Opioid Dependence on Agonist Therapy (Opioid Use Disorder on Agonist Therapy)      Short Term Goals: Pt will use at least one copying skill a day to reduce a risk of relapse, enhance coping with OUD, and support her recovery process     Short Term Target:  60 days  Short TermTarget Date: 08/23/2022  Short Term Goal #1 Progress: progressing     Long Term Goals: Pt will report 50% in urges and cravings reduction and no relapses or close calls       Long Term Target:  90 days  Long TermTarget Date:  09/22/2022   Long Term Goal #1 Progress:  progressing      Intervention #1: PCBHI-based OBOT/Group Psychotherapy   Intervention Frequency:  biweekly  Intervention Duration:  90 Days  Intervention Responsibility:  J. Verdis Frederickson, PhD and the OBOT team

## 2022-07-02 ENCOUNTER — Ambulatory Visit (HOSPITAL_BASED_OUTPATIENT_CLINIC_OR_DEPARTMENT_OTHER): Payer: 59 | Admitting: Specialist

## 2022-07-11 ENCOUNTER — Other Ambulatory Visit: Payer: Self-pay

## 2022-07-11 ENCOUNTER — Ambulatory Visit: Payer: 59 | Attending: Specialist | Admitting: Specialist

## 2022-07-11 ENCOUNTER — Telehealth (HOSPITAL_BASED_OUTPATIENT_CLINIC_OR_DEPARTMENT_OTHER): Payer: Self-pay | Admitting: Internal Medicine

## 2022-07-11 DIAGNOSIS — M21161 Varus deformity, not elsewhere classified, right knee: Secondary | ICD-10-CM | POA: Insufficient documentation

## 2022-07-11 DIAGNOSIS — M25561 Pain in right knee: Secondary | ICD-10-CM | POA: Insufficient documentation

## 2022-07-11 DIAGNOSIS — M1711 Unilateral primary osteoarthritis, right knee: Secondary | ICD-10-CM | POA: Insufficient documentation

## 2022-07-11 NOTE — Telephone Encounter (Signed)
-----   Message from Josefa Half sent at 07/11/2022  2:06 PM EDT -----  Regarding: med brands  Khamia Melloy [id number], 67 year old, female    Calls today: Advising Dr. Lisa Roca that the estrogen brands keep changing from the pharmacy and it's not working.    Person calling on behalf of patient: Patient (self)    CALL BACK NUMBER: [phone]    Patient's language of care: English    Patient does not need an interpreter.    Patient's PCP: [name], MD    Primary Care Home Site:  [facility]

## 2022-07-11 NOTE — Telephone Encounter (Signed)
Patient should have appointment. Scheduling center to help schedule

## 2022-07-11 NOTE — Telephone Encounter (Signed)
Patient calls today with concerns about estrogen and changing of brand names. I suspect that this is due to insurance requirements.  Appears that provider did request [employer] only on 06/06/22.     Will ask provider what next steps are or if patient needs follow up appointment to review symptoms and options further

## 2022-07-15 ENCOUNTER — Ambulatory Visit: Payer: 59 | Attending: Psychosomatic Medicine | Admitting: Clinical

## 2022-07-15 ENCOUNTER — Encounter (HOSPITAL_BASED_OUTPATIENT_CLINIC_OR_DEPARTMENT_OTHER): Payer: Self-pay

## 2022-07-15 ENCOUNTER — Ambulatory Visit: Payer: 59 | Attending: Family Medicine | Admitting: Family Medicine

## 2022-07-15 ENCOUNTER — Encounter (HOSPITAL_BASED_OUTPATIENT_CLINIC_OR_DEPARTMENT_OTHER): Payer: Self-pay | Admitting: Family Medicine

## 2022-07-15 ENCOUNTER — Telehealth (HOSPITAL_BASED_OUTPATIENT_CLINIC_OR_DEPARTMENT_OTHER): Payer: Self-pay | Admitting: Internal Medicine

## 2022-07-15 DIAGNOSIS — F1120 Opioid dependence, uncomplicated: Secondary | ICD-10-CM | POA: Diagnosis not present

## 2022-07-15 DIAGNOSIS — F3341 Major depressive disorder, recurrent, in partial remission: Secondary | ICD-10-CM | POA: Insufficient documentation

## 2022-07-15 MED ORDER — BUPRENORPHINE HCL-NALOXONE HCL 8-2 MG SL FILM
ORAL_FILM | SUBLINGUAL | 0 refills | Status: DC
Start: 2022-07-15 — End: 2022-08-05

## 2022-07-15 NOTE — Telephone Encounter (Signed)
Left message to return call to the office. Please schedule appointment per providers request.

## 2022-07-15 NOTE — Progress Notes (Signed)
Cheryl Dixon is a 67 year old female seen in follow up for opioid use disorder:    Buprenorphine dose: 20mg   Response, adequacy of dose: good  Relapses/close calls: none  Trigger: denies  Meetings: none currently    Social history/events:  Doing well  No major updates  Suboxone continues to work well for her    Present Medications:  atorvastatin (LIPITOR) 80 MG tablet, Take 1 tablet by mouth Daily after dinner, Disp: 90 tablet, Rfl: 3  polyethylene glycol (GLYCOLAX/MIRALAX) 17 g packet, Take 1 packet by mouth in the morning., Disp: 90 packet, Rfl: 3  docusate sodium (COLACE) 100 MG capsule, Take 1 capsule by mouth in the morning and 1 capsule before bedtime., Disp: 180 capsule, Rfl: 3  aspirin 81 MG chewable tablet, Take 1 tablet by mouth in the morning., Disp: 90 tablet, Rfl: 3  bisacodyl (DULCOLAX) 5 MG EC tablet, Take 8 tablets by mouth See Admin Instructions Take as directed prior to colonoscopy, Disp: 8 tablet, Rfl: 0  buPROPion (WELLBUTRIN SR) 200 MG 12 hr tablet, Take 1 tablet by mouth in the morning and 1 tablet before bedtime., Disp: 180 tablet, Rfl: 3  solifenacin (VESICARE) 10 MG tablet, Take 1 tablet by mouth in the morning., Disp: , Rfl:   polyethylene glycol (GOLYTELY) 236 g suspension, Take 4,000 mLs by mouth See Admin Instructions Take as directed prior to your colonoscopy, Disp: 4000 mL, Rfl: 0  Simethicone 80 MG TABS, Take 4 tablets by mouth See Admin Instructions Take as directed prior to colonoscopy., Disp: 4 tablet, Rfl: 0  risedronate (ACTONEL) 150 MG tablet, Take 1 tablet by mouth every 30 (thirty) days with full glass of water on empty stomach-nothing else by mouth and do not lie down for 1/2 hour, Disp: 3 tablet, Rfl: 3  EC-NAPROXEN 500 MG EC tablet, TAKE 1 TABLET BY MOUTH IN THE MORNING AND IN THE EVENING WITH MEALS, Disp: 60 tablet, Rfl: 2  albuterol HFA 108 (90 Base) MCG/ACT inhaler, INHALE 2 PUFFS INTO THE LUNGS EVERY 6 HOURS AS NEEDED FOR WHEEZING OR SHORTNESS OF BREATH, Disp: 8.5 g,  Rfl: 11  cholecalciferol (VITAMIN D3) 2000 UNIT TABS tablet, Take 1 tablet by mouth in the morning., Disp: 90 tablet, Rfl: 3  acetaminophen (TYLENOL) 500 MG tablet, Take 500 mg by mouth every 6 (six) hours as needed for Pain, Disp: , Rfl:   ipratropium-albuterol (DUO-NEB) 0.5-2.5 (3) MG/3ML SOLN Inhalation Solution, Take 3 mLs by nebulization 4 (four) times daily., Disp: 180 mL, Rfl: 0    No current facility-administered medications on file prior to visit.          06/11/2022     4:24 PM 12/19/2021     8:27 AM 01/12/2019    10:47 AM   PHQ-9 TOTAL SCORE   Doc FlowSheet Total Score 12 17 10    MyChart Total Score  17 (BPA)            PHYSICAL EXAMINATION via video:   General appearance - healthy female in no distress  Neuro - nonfocal  Affect - normal  Behavior without evidence of intoxication or impairment    PMP Reviewed.  No unauthorized prescriptions since last refill.     Assessment:  Opioid Dependence  Comment: Stable in sobriety. Last UDS 06/11/2022 consistent.  Plan:  Continue buprenorphine, reviewed criteria for tapering when patient is ready. Discussed sources of support.  Patient is counseled regarding relapse prevention, involvement in recovery groups, and potential side effects of buprenorphine.  For the patient participating in the group via telephone and/or video technology: the patient/guardian has verbally consented to participate in this group therapy session by televisit. The patient/guardian was encouraged to be in a private location due to personal health information being discussed and where they could not be overheard by individuals not participating in the group therapy. Although all [facility] staff/providers participating in this group therapy televisit are in a private location, all the risks of telephone and/or video technology used to conduct this group therapy session cannot be controlled by [facility], (i.e. interruptions, unauthorized access/recording and technical difficulties).  Each patient  understands that the visit can be stopped at any time for any reason, including if the conferencing connections are inadequate.     Julieta Gutting, MD, 07/15/2022

## 2022-07-15 NOTE — Progress Notes (Signed)
OBOT (OFFICE BASED OPIOID TREATMENT)  GROUP PROGRESS NOTE     Televisit consent, as applicable:  For the patient participating in the group via telephone and/or video technology: The patient/guardian has verbally consented to participate in this group therapy session by televisit. The patient/guardian was encouraged to be in a private location due to personal health information being discussed and where they could not be overheard by individuals not participating in the group therapy. Although all [facility] staff/providers participating in this group therapy televisit are in a private location, all the risks of telephone and/or video technology used to conduct this group therapy session cannot be controlled by [place], (i.e. interruptions, unauthorized access/recording and technical difficulties).  Each patient understands that the visit can be stopped at any time for any reason, including if the conferencing connections are inadequate.     GROUP NAME:  Office Based Opioid Treatment Group     LEADER(S): Julieta Gutting, MD, Cherlynn Perches, Ph.D, [name], RN       Cheryl Dixon is a 67 year old, Divorced, female, who presented for [facility] group visit.   Length of group: 60 min which included 30 min of Health Behavior Intervention and OUD Psychoeducation.     Service Type: [phone] Group Psychotherapy     Group Topic Today:  The Power of Gratitude in Recovery. Group members performed "Three good things" gratitude exercise. Group members discussed how practicing gratitude can improve positive recovery experience. Group members  discussed strategies to support successful recovery.  Group members shared strategies to reduce relapse risks and increase healthy coping mechanisms to support healthy change.    Number of participants in group today: 10        Purpose of Group (choose all that apply):   Support (psychological, family, community resources)   Education about Opioid Use Disorder (OUD) and treatment  Enhancing adjustment and  coping with OUD  Improving management of OUD and treatment adherence  Supporting Recovery Process  Increasing health promoting behaviors  Decreasing health risk behaviors and risks of relapse  Addressing psychological/psychosocial/behavioral barriers to preventing addiction and relapse  Promotion of functional improvement              Group Process:    Group rules   Review of group goals/structure  Group discussion and sharing.    Review of strategies to increase healthy coping mechanisms to support recovery and change.  Review of additional relapse prevention strategies.   Mindfulness and self-compassion techniques introduced.  Discussion of support   Check in    Individual Patient Participation:    Pt reports doing well. She celebrated her birthday a couple of weeks ago.   Feels she has no major updates. Suboxone works well for her.   No relapses/close calls reported. Overall, appears to be doing well.      Medical Necessity of Session (how treatment is necessary to improve symptoms, functioning, or prevent worsening): treatment in necessary to enhance compliance with agonist treatment and maintain abstinence from opioids. Suboxone group participation is mandatory to continue Suboxone prescription and is necessary in order to enhance coping with Opioid Use Disorder.      Relevant Changes in Mental Status: No.     Risk Level per Scale:  Suicide: low  Violence: low  Addiction: low/moderate    Current risk level represents increase in risk: No.    Reviewing Today's Visit:  Clinical Interventions Today: Motivational Interviewing; Cognitive Behavioral Therapy (CBT)  Patient's Response to Interventions: Responsive    DIAGNOSES:  Opioid dependence on agonist therapy ([place])  (primary encounter diagnosis)  MDD (major depressive disorder), recurrent, in partial remission ([place])    Plan: Pt will continue OBOT treatment     Cherlynn Perches, Ph.D.

## 2022-07-15 NOTE — Telephone Encounter (Signed)
[name], MD  Felipa Eth RN; Primary Care Scheduling Call Center Pool4 days ago     Yes we need a televisit    Scheduling center --    Jonise Beitler  [id number]  [place]    Please assist this patient with the following:    Schedule follow-up televisit for estrogen replacement, time frame within 1 month, with [facility], if appointment not available: Schedule with alternate provider

## 2022-07-16 ENCOUNTER — Other Ambulatory Visit (HOSPITAL_BASED_OUTPATIENT_CLINIC_OR_DEPARTMENT_OTHER): Payer: Self-pay | Admitting: Internal Medicine

## 2022-07-16 MED ORDER — NAPROXEN 500 MG PO TBEC
DELAYED_RELEASE_TABLET | ORAL | 2 refills | Status: DC
Start: 2022-07-16 — End: 2022-10-25

## 2022-07-16 NOTE — Telephone Encounter (Signed)
PER Pharmacy, Cheryl Dixon is a 67 year old female has requested a refill of    EC-NAPROXEN 500 MG EC tablet         Last Office Visit: 07/15/2022 with Andris Baumann, C  Last Physical Exam: 05/27/2013    There are no preventive care reminders to display for this patient.    Other Med Adult:  Most Recent BP Reading(s)  06/11/22 : 118/68        Cholesterol (mg/dL)   Date Value   12/12/2021 222     LOW DENSITY LIPOPROTEIN DIRECT (mg/dL)   Date Value   12/12/2021 160     HIGH DENSITY LIPOPROTEIN (mg/dL)   Date Value   12/12/2021 44     TRIGLYCERIDES (mg/dL)   Date Value   12/12/2021 119         THYROID SCREEN TSH REFLEX FT4 (uIU/mL)   Date Value   01/12/2019 1.630         No results found for: "TSH"    HEMOGLOBIN A1C (%)   Date Value   12/12/2021 5.7 (H)       No results found for: "POCA1C"      INR (no units)   Date Value   12/12/2021 1.1   02/16/2007 1.0 (L)   07/03/2006 < 1.0 (L)       SODIUM (mmol/L)   Date Value   12/12/2021 140       POTASSIUM (mmol/L)   Date Value   12/12/2021 4.2           CREATININE (mg/dL)   Date Value   12/12/2021 1.0       Documented patient preferred pharmacies:    [id number] [place] [place] [facility] [place]  Phone: [phone] Fax: [phone]

## 2022-07-19 ENCOUNTER — Other Ambulatory Visit (HOSPITAL_BASED_OUTPATIENT_CLINIC_OR_DEPARTMENT_OTHER): Payer: Self-pay | Admitting: Internal Medicine

## 2022-07-19 NOTE — Telephone Encounter (Signed)
PER Pharmacy, Cheryl Dixon is a 67 year old female has requested a refill of      -  Vitamin D       Last Office Visit: 07/15/22 with Andris Baumann, Loletha Grayer  Last Physical Exam: 05/27/13     There are no preventive care reminders to display for this patient.     Other Med Adult:  Most Recent BP Reading(s)  06/11/22 : 118/68        Cholesterol (mg/dL)   Date Value   12/12/2021 222     LOW DENSITY LIPOPROTEIN DIRECT (mg/dL)   Date Value   12/12/2021 160     HIGH DENSITY LIPOPROTEIN (mg/dL)   Date Value   12/12/2021 44     TRIGLYCERIDES (mg/dL)   Date Value   12/12/2021 119         THYROID SCREEN TSH REFLEX FT4 (uIU/mL)   Date Value   01/12/2019 1.630         No results found for: "TSH"    HEMOGLOBIN A1C (%)   Date Value   12/12/2021 5.7 (H)       No results found for: "POCA1C"      INR (no units)   Date Value   12/12/2021 1.1   02/16/2007 1.0 (L)   07/03/2006 < 1.0 (L)       SODIUM (mmol/L)   Date Value   12/12/2021 140       POTASSIUM (mmol/L)   Date Value   12/12/2021 4.2           CREATININE (mg/dL)   Date Value   12/12/2021 1.0        Documented patient preferred pharmacies:    [id number] [place], [place] [facility] [place]  Phone: [phone] Fax: [phone]

## 2022-07-22 ENCOUNTER — Ambulatory Visit: Payer: 59 | Admitting: Family Medicine

## 2022-07-22 ENCOUNTER — Ambulatory Visit (HOSPITAL_BASED_OUTPATIENT_CLINIC_OR_DEPARTMENT_OTHER): Payer: 59 | Admitting: Clinical

## 2022-08-05 ENCOUNTER — Telehealth (HOSPITAL_BASED_OUTPATIENT_CLINIC_OR_DEPARTMENT_OTHER): Payer: Self-pay | Admitting: Family Medicine

## 2022-08-05 DIAGNOSIS — F1120 Opioid dependence, uncomplicated: Secondary | ICD-10-CM

## 2022-08-05 MED ORDER — BUPRENORPHINE HCL-NALOXONE HCL 8-2 MG SL FILM
ORAL_FILM | SUBLINGUAL | 0 refills | Status: DC
Start: 2022-08-05 — End: 2022-08-12

## 2022-08-05 NOTE — Telephone Encounter (Signed)
Spoke w/ patient  She is doing well  Missed suboxoen group b/c of error, televisit not scheduled  She will come to next week's group  Rx sent    Azzie Glatter, MD

## 2022-08-12 ENCOUNTER — Encounter (HOSPITAL_BASED_OUTPATIENT_CLINIC_OR_DEPARTMENT_OTHER): Payer: Self-pay | Admitting: Family Medicine

## 2022-08-12 ENCOUNTER — Ambulatory Visit: Payer: 59 | Attending: Family Medicine | Admitting: Family Medicine

## 2022-08-12 ENCOUNTER — Ambulatory Visit: Payer: 59 | Attending: Psychosomatic Medicine | Admitting: Clinical

## 2022-08-12 DIAGNOSIS — F1120 Opioid dependence, uncomplicated: Secondary | ICD-10-CM | POA: Insufficient documentation

## 2022-08-12 DIAGNOSIS — F3341 Major depressive disorder, recurrent, in partial remission: Secondary | ICD-10-CM | POA: Diagnosis not present

## 2022-08-12 MED ORDER — BUPRENORPHINE HCL-NALOXONE HCL 8-2 MG SL FILM
ORAL_FILM | SUBLINGUAL | 0 refills | Status: DC
Start: 2022-08-12 — End: 2022-09-09

## 2022-08-12 NOTE — Progress Notes (Signed)
Cheryl Dixon is a 67 year old female seen in follow up for opioid use disorder:    Buprenorphine dose:   Response, adequacy of dose: good  Relapses/close calls: none  Trigger: denies  Meetings: none currently    Social history/events:  Doing well  Has an upcoming medical apt she intends to drop off a urine when here  Getting ready for knee surgery    Present Medications:  buprenorphine-naloxone (SUBOXONE) 8-2 MG sublingual film, Take 2.5 films under the tongue daily., Disp: 18 Film, Rfl: 0  VITAMIN D3 50 MCG (2000 UT) TABS tablet, TAKE 1 TABLET BY MOUTH EVERY DAY IN THE MORNING, Disp: 90 tablet, Rfl: 3  naproxen (EC-NAPROXEN) 500 MG EC tablet, TAKE 1 TABLET BY MOUTH IN THE MORNING AND IN THE EVENING WITH MEALS, Disp: 60 tablet, Rfl: 2  atorvastatin (LIPITOR) 80 MG tablet, Take 1 tablet by mouth Daily after dinner, Disp: 90 tablet, Rfl: 3  polyethylene glycol (GLYCOLAX/MIRALAX) 17 g packet, Take 1 packet by mouth in the morning., Disp: 90 packet, Rfl: 3  docusate sodium (COLACE) 100 MG capsule, Take 1 capsule by mouth in the morning and 1 capsule before bedtime., Disp: 180 capsule, Rfl: 3  aspirin 81 MG chewable tablet, Take 1 tablet by mouth in the morning., Disp: 90 tablet, Rfl: 3  bisacodyl (DULCOLAX) 5 MG EC tablet, Take 8 tablets by mouth See Admin Instructions Take as directed prior to colonoscopy, Disp: 8 tablet, Rfl: 0  buPROPion (WELLBUTRIN SR) 200 MG 12 hr tablet, Take 1 tablet by mouth in the morning and 1 tablet before bedtime., Disp: 180 tablet, Rfl: 3  solifenacin (VESICARE) 10 MG tablet, Take 1 tablet by mouth in the morning., Disp: , Rfl:   polyethylene glycol (GOLYTELY) 236 g suspension, Take 4,000 mLs by mouth See Admin Instructions Take as directed prior to your colonoscopy, Disp: 4000 mL, Rfl: 0  Simethicone 80 MG TABS, Take 4 tablets by mouth See Admin Instructions Take as directed prior to colonoscopy., Disp: 4 tablet, Rfl: 0  risedronate (ACTONEL) 150 MG tablet, Take 1 tablet by mouth every  30 (thirty) days with full glass of water on empty stomach-nothing else by mouth and do not lie down for 1/2 hour, Disp: 3 tablet, Rfl: 3  albuterol HFA 108 (90 Base) MCG/ACT inhaler, INHALE 2 PUFFS INTO THE LUNGS EVERY 6 HOURS AS NEEDED FOR WHEEZING OR SHORTNESS OF BREATH, Disp: 8.5 g, Rfl: 11  acetaminophen (TYLENOL) 500 MG tablet, Take 500 mg by mouth every 6 (six) hours as needed for Pain, Disp: , Rfl:   ipratropium-albuterol (DUO-NEB) 0.5-2.5 (3) MG/3ML SOLN Inhalation Solution, Take 3 mLs by nebulization 4 (four) times daily., Disp: 180 mL, Rfl: 0    No current facility-administered medications on file prior to visit.          06/11/2022     4:24 PM 12/19/2021     8:27 AM 01/12/2019    10:47 AM   PHQ-9 TOTAL SCORE   Doc FlowSheet Total Score MyChart Total Score  17 (BPA)            PHYSICAL EXAMINATION via video:   General appearance - healthy female in no distress  Neuro - nonfocal  Affect - normal  Behavior without evidence of intoxication or impairment    PMP Reviewed.  No unauthorized prescriptions since last refill.     Assessment:  Opioid Dependence  Comment: Stable in sobriety. Last UDS 06/11/2022 consistent. Plans to update this  month at upcoming wellness visit. OK for monthly scripts.  Plan:  Continue buprenorphine, reviewed criteria for tapering when patient is ready. Discussed sources of support.  Patient is counseled regarding relapse prevention, involvement in recovery groups, and potential side effects of buprenorphine.    For the patient participating in the group via telephone and/or video technology: the patient/guardian has verbally consented to participate in this group therapy session by televisit. The patient/guardian was encouraged to be in a private location due to personal health information being discussed and where they could not be overheard by individuals not participating in the group therapy. Although all [name] staff/providers participating in this group therapy televisit are  in a private location, all the risks of telephone and/or video technology used to conduct this group therapy session cannot be controlled by [place], (i.e. interruptions, unauthorized access/recording and technical difficulties).  Each patient understands that the visit can be stopped at any time for any reason, including if the conferencing connections are inadequate.     Azzie Glatter, MD, 08/12/2022

## 2022-08-13 NOTE — Progress Notes (Signed)
OBOT (OFFICE BASED OPIOID TREATMENT)  GROUP PROGRESS NOTE     Televisit consent, as applicable:  For the patient participating in the group via telephone and/or video technology: The patient/guardian has verbally consented to participate in this group therapy session by televisit. The patient/guardian was encouraged to be in a private location due to personal health information being discussed and where they could not be overheard by individuals not participating in the group therapy. Although all [facility] staff/providers participating in this group therapy televisit are in a private location, all the risks of telephone and/or video technology used to conduct this group therapy session cannot be controlled by [place], (i.e. interruptions, unauthorized access/recording and technical difficulties).  Each patient understands that the visit can be stopped at any time for any reason, including if the conferencing connections are inadequate.     GROUP NAME:  Office Based Opioid Treatment Group     LEADER(S): Azzie Glatter, MD, Milta Deiters, Ph.D, and Boneta Lucks, RN    Cheryl Dixon is a 67 year old, Divorced, female, who presented for [facility] group visit.   Length of group: 60 min which included 30 min of Health Behavior Intervention and OUD Psychoeducation.     Service Type: [id number] Group Psychotherapy     Group Topic Today: Urge Surfing for Relapse prevention. Group members discussed the Urge Surfing  skill,  a useful tool to reduce unwanted behaviors and urges. It is a change-oriented strategy to change umpulsive or unwanted behaviors. Group members discussed how the Urge Surfing skill can be valuable to address issues when maintaining sobriety. Group members discussed strategies to reduce relapse risks and increase healthy coping mechanisms to support recovery and healthy change.          Number of participants in group today: 10        Purpose of Group (choose all that apply):   Support (psychological, family, community  resources)   Education about Opioid Use Disorder (OUD) and treatment  Enhancing adjustment and coping with OUD  Improving management of OUD and treatment adherence  Supporting Recovery Process  Increasing health promoting behaviors  Decreasing health risk behaviors and risks of relapse  Addressing psychological/psychosocial/behavioral barriers to preventing addiction and relapse  Promotion of functional improvement              Group Process:    Group rules   Review of group goals/structure  Group discussion and sharing.    Review of strategies to increase healthy coping mechanisms to support recovery and change.  Review of additional relapse prevention strategies.   Mindfulness and self-compassion techniques introduced.  Discussion of support   Check in    Individual Patient Participation:  Pt was engaged and participated in group discussion. She shared about preparing for knee surgery.  She reported doing well. She has an upcoming apt and will drop off a urine when at the clinic.  No relapses/close calls disclosed.       Medical Necessity of Session (how treatment is necessary to improve symptoms, functioning, or prevent worsening): treatment in necessary to enhance compliance with agonist treatment and maintain abstinence from opioids. Suboxone group participation is mandatory to continue Suboxone prescription and is necessary in order to enhance coping with Opioid Use Disorder.      Relevant Changes in Mental Status: No.     Risk Level per Scale:  Suicide: low  Violence: low  Addiction: low/moderate    Current risk level represents increase in risk: No.    Reviewing  Today's Visit:  Clinical Interventions Today: Motivational Interviewing; Cognitive Behavioral Therapy (CBT)  Patient's Response to Interventions: Responsive    DIAGNOSES:    Opioid dependence on agonist therapy (HCC)  (primary encounter diagnosis)  MDD (major depressive disorder), recurrent, in partial remission (HCC)    Plan: Pt will continue OBOT  treatment     Milta Deiters, Ph.D.

## 2022-08-19 ENCOUNTER — Telehealth (HOSPITAL_BASED_OUTPATIENT_CLINIC_OR_DEPARTMENT_OTHER): Payer: Self-pay | Admitting: Internal Medicine

## 2022-08-19 DIAGNOSIS — N952 Postmenopausal atrophic vaginitis: Secondary | ICD-10-CM

## 2022-08-19 MED ORDER — ESTRADIOL 0.1 MG/GM VA CREA
TOPICAL_CREAM | VAGINAL | 0 refills | Status: DC
Start: 2022-08-19 — End: 2022-12-08

## 2022-08-19 NOTE — Addendum Note (Signed)
Addended by: Shaleah Nissley on: 08/19/2022 10:25 AM     Modules accepted: Orders

## 2022-08-19 NOTE — Telephone Encounter (Signed)
Prescription sent electronically to preferred pharmacy.

## 2022-08-19 NOTE — Telephone Encounter (Addendum)
Message sent to provider.      ----- Message from Burnett Harry sent at 08/19/2022  9:10 AM EDT -----  Regarding: needs refills  Cheryl Dixon [id number], 67 year old, female    Calls today: Patient is trying to pick up meds but has no refills, says she is withdrawing hasn't used It for 2 days - estradiol (ESTRACE) 0.1 MG/GM vaginal cream    Person calling on behalf of patient: Patient (self)    CALL BACK NUMBER: [phone]      Patient's language of care: English    Patient does not need an interpreter.    Patient's PCP: Hildred Laser, MD    Primary Care Home Site:  [facility]

## 2022-08-19 NOTE — Telephone Encounter (Signed)
Pt call, [facility] [address]   [address], [place] [address]

## 2022-08-20 ENCOUNTER — Encounter (HOSPITAL_BASED_OUTPATIENT_CLINIC_OR_DEPARTMENT_OTHER): Payer: Self-pay | Admitting: Internal Medicine

## 2022-08-20 ENCOUNTER — Other Ambulatory Visit: Payer: Self-pay

## 2022-08-20 ENCOUNTER — Ambulatory Visit: Payer: 59 | Attending: Internal Medicine | Admitting: Internal Medicine

## 2022-08-20 VITALS — BP 118/80 | HR 63 | Temp 97.7°F | Ht 61.42 in | Wt 200.0 lb

## 2022-08-20 DIAGNOSIS — Z77011 Contact with and (suspected) exposure to lead: Secondary | ICD-10-CM | POA: Diagnosis present

## 2022-08-20 DIAGNOSIS — R5383 Other fatigue: Secondary | ICD-10-CM | POA: Insufficient documentation

## 2022-08-20 DIAGNOSIS — J449 Chronic obstructive pulmonary disease, unspecified: Secondary | ICD-10-CM | POA: Insufficient documentation

## 2022-08-20 DIAGNOSIS — I7121 Aneurysm of the ascending aorta, without rupture: Secondary | ICD-10-CM | POA: Diagnosis not present

## 2022-08-20 DIAGNOSIS — Z6832 Body mass index (BMI) 32.0-32.9, adult: Secondary | ICD-10-CM | POA: Diagnosis present

## 2022-08-20 DIAGNOSIS — N952 Postmenopausal atrophic vaginitis: Secondary | ICD-10-CM | POA: Diagnosis present

## 2022-08-20 DIAGNOSIS — M810 Age-related osteoporosis without current pathological fracture: Secondary | ICD-10-CM | POA: Diagnosis present

## 2022-08-20 DIAGNOSIS — F332 Major depressive disorder, recurrent severe without psychotic features: Secondary | ICD-10-CM | POA: Diagnosis not present

## 2022-08-20 DIAGNOSIS — Z122 Encounter for screening for malignant neoplasm of respiratory organs: Secondary | ICD-10-CM | POA: Insufficient documentation

## 2022-08-20 DIAGNOSIS — F1120 Opioid dependence, uncomplicated: Secondary | ICD-10-CM | POA: Diagnosis present

## 2022-08-20 DIAGNOSIS — I6330 Cerebral infarction due to thrombosis of unspecified cerebral artery: Secondary | ICD-10-CM | POA: Insufficient documentation

## 2022-08-20 LAB — FENTANYL URINE: FENTANYL URINE: NEGATIVE ng/mL

## 2022-08-20 LAB — CBC WITH PLATELET
ABSOLUTE NRBC COUNT: 0 10*3/uL (ref 0.0–0.0)
HEMATOCRIT: 43.9 % (ref 34.1–44.9)
HEMOGLOBIN: 13.5 g/dL (ref 11.2–15.7)
MEAN CORP HGB CONC: 30.8 g/dL — ABNORMAL LOW (ref 31.0–37.0)
MEAN CORPUSCULAR HGB: 27.9 pg (ref 26.0–34.0)
MEAN CORPUSCULAR VOL: 90.7 fl (ref 80.0–100.0)
MEAN PLATELET VOLUME: 9.9 fL (ref 8.7–12.5)
NRBC %: 0 % (ref 0.0–0.0)
PLATELET COUNT: 249 10*3/uL (ref 150–400)
RBC DISTRIBUTION WIDTH STD DEV: 44.8 fL (ref 35.1–46.3)
RED BLOOD CELL COUNT: 4.84 M/uL (ref 3.90–5.20)
WHITE BLOOD CELL COUNT: 6.3 10*3/uL (ref 4.0–11.0)

## 2022-08-20 LAB — HEMOGLOBIN A1C
ESTIMATED AVERAGE GLUCOSE: 114 mg/dL (ref 74–160)
HEMOGLOBIN A1C: 5.6 % (ref 4.0–5.6)

## 2022-08-20 LAB — COMPREHENSIVE METABOLIC PANEL
ALANINE AMINOTRANSFERASE: 11 U/L — ABNORMAL LOW (ref 12–45)
ALBUMIN: 4.3 g/dL (ref 3.4–5.2)
ALKALINE PHOSPHATASE: 60 U/L (ref 45–117)
ANION GAP: 7 mmol/L — ABNORMAL LOW (ref 10–22)
ASPARTATE AMINOTRANSFERASE: 15 U/L (ref 8–34)
BILIRUBIN TOTAL: 0.3 mg/dL (ref 0.2–1.0)
BUN (UREA NITROGEN): 13 mg/dL (ref 7–18)
CALCIUM: 9.4 mg/dL (ref 8.5–10.5)
CARBON DIOXIDE: 30 mmol/L (ref 21–32)
CHLORIDE: 104 mmol/L (ref 98–107)
CREATININE: 0.9 mg/dL (ref 0.4–1.2)
ESTIMATED GLOMERULAR FILT RATE: >60 mL/min (ref >60)
Glucose Random: 80 mg/dL (ref 74–160)
POTASSIUM: 4.9 mmol/L (ref 3.5–5.1)
SODIUM: 141 mmol/L (ref 136–145)
TOTAL PROTEIN: 6.5 g/dL (ref 6.4–8.2)

## 2022-08-20 LAB — CANNABINOIDS URINE: CANNABINOIDS URINE: POSITIVE ng/mL — AB

## 2022-08-20 LAB — METHADONE URINE: METHADONE URINE: NEGATIVE ng/mL

## 2022-08-20 LAB — SPECIMEN VALIDITY URINE
SPEC VALIDITY SPECIFIC GRAVITY: 1.01 (ref 1.003–1.035)
SPECIMEN VALIDITY URINE CREAT: 134 mg/dL (ref >20)
SPECIMEN VALIDITY URINE PH: 5.8 (ref 5.0–8.5)

## 2022-08-20 LAB — OXYCODONE SCREEN URINE: OXYCOD SCRN URINE: NEGATIVE ng/mL

## 2022-08-20 LAB — COCAINE METABOLITES URINE: COCAINE METABOLITES URINE: NEGATIVE ng/mL

## 2022-08-20 LAB — BENZODIAZEPINES URINE: BENZODIAZEPINES URINE: POSITIVE ng/mL — AB

## 2022-08-20 LAB — AMPHETAMINES URINE: AMPHETAMINES URINE: NEGATIVE ng/mL

## 2022-08-20 LAB — HIGH DENSITY LIPOPROTEIN: HIGH DENSITY LIPOPROTEIN: 47 mg/dL (ref 40–60)

## 2022-08-20 LAB — BUPRENORPHINE SCREEN URINE: BUPRENORPHINE SCREEN URINE: POSITIVE ng/mL — AB

## 2022-08-20 LAB — THYROID SCREEN TSH REFLEX FT4: THYROID SCREEN TSH REFLEX FT4: 4.1 u[IU]/mL (ref 0.270–4.200)

## 2022-08-20 LAB — OPIATES URINE: OPIATES URINE: NEGATIVE ng/mL

## 2022-08-20 LAB — LOW DENSITY LIPOPROTEIN DIRECT: LOW DENSITY LIPOPROTEIN DIRECT: 151 mg/dL (ref 0–189)

## 2022-08-20 LAB — CHOLESTEROL: Cholesterol: 212 mg/dL (ref 0–239)

## 2022-08-20 MED ORDER — HYDROCORTISONE 2.5 % EX CREA
TOPICAL_CREAM | Freq: Two times a day (BID) | CUTANEOUS | 1 refills | Status: DC
Start: 2022-08-20 — End: 2023-03-20

## 2022-08-20 MED ORDER — TIOTROPIUM BROMIDE-OLODATEROL 2.5-2.5 MCG/ACT IN AERS
2.00 | INHALATION_SPRAY | Freq: Every day | RESPIRATORY_TRACT | 3 refills | Status: AC
Start: 2022-08-20 — End: 2023-02-16

## 2022-08-20 NOTE — Assessment & Plan Note (Signed)
-   check echo in August, 1 year after last CTA at [facility]

## 2022-08-20 NOTE — Progress Notes (Signed)
Dear RN,  Please let patient know:    Labs generally look good except for elevated cholesterol.  Please confirm she has been taking atorvastatin regularly daily for the last month.    - if so we should add a second med ezetimibe (Zetia) to get the cholesterol under control and reduce the change of another stroke  - if not take atorvastatin consistently and recheck in 1 month

## 2022-08-20 NOTE — Assessment & Plan Note (Signed)
Stable on current inhalers.  Never had PFTs, but clinically has not had a lot of exacerbations or severe symptoms.  - will try again to get Stiolto (LAMA/LABA) and albuterol PRN.  If not continue Incruse  - referred for PFTs

## 2022-08-20 NOTE — Assessment & Plan Note (Addendum)
-   check UDS  Taking nonprescribed BDZ for sleep.  Discussion of alternatives for sleep deferred given multiple other issues to address.  Will call patient for follow up to discuss.  May need eval for OSA before trying sedatives.

## 2022-08-20 NOTE — Progress Notes (Deleted)
Subjective     Cheryl Dixon is a 67 year old female seen for: ***    Social History    Tobacco Use      Smoking status: Former        Packs/day: 1.00        Years: 32.00        Additional pack years: 0.00        Total pack years: 32.00        Types: Cigarettes        Quit date: 09/26/2021        Years since quitting: 0.8      Smokeless tobacco: Never      Tobacco comments: quit 1980-1996, then light until 2003, then 1 ppd since    Alcohol use: No    Drug use: Yes      Types: Marijuana      Comment: past opioids, nasal heroin, 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  MJ 1x per week    Patient Active Problem List:     Opioid dependence on agonist therapy (HCC)     Tobacco use disorder     Fibrocystic breast     Elevated BP without diagnosis of hypertension     Knee pain     Chronic low back pain     OA (osteoarthritis) of knee     H. pylori infection     Major depressive disorder, recurrent episode, severe (HCC)     Occult blood in stools     Prediabetes     Epigastric pain     Chronic obstructive pulmonary disease (HCC)     Left foot pain     Multiple polyps of colon     Acquired deformity of toe, right     Dislocation of metatarsophalangeal joint of lesser toe, right, sequela     Postmenopausal atrophic vaginitis     Abnormal loss of weight     Aneurysm of ascending aorta without rupture (HCC)     COVID-19 virus infection     BMI 32.0-32.9,adult     Dysuria     LLQ pain     Osteoporosis     History of endometriosis     Lung cancer screening     Constipation, drug induced     Weakness     Cerebrovascular accident (CVA) (HCC)     Overactive bladder    estradiol (ESTRACE) 0.1 MG/GM vaginal cream, PLACE 5 G VAGINALLY 3 TIMES A WEEK MYLAN BRAND ONLY., Disp: 170 g, Rfl: 0  buprenorphine-naloxone (SUBOXONE) 8-2 MG sublingual film, Take 2.5 films under the tongue daily., Disp: 70 Film, Rfl: 0  VITAMIN D3 50 MCG (2000 UT) TABS tablet, TAKE 1 TABLET BY MOUTH EVERY DAY IN THE MORNING, Disp: 90 tablet, Rfl: 3  naproxen (EC-NAPROXEN) 500 MG EC tablet,  TAKE 1 TABLET BY MOUTH IN THE MORNING AND IN THE EVENING WITH MEALS, Disp: 60 tablet, Rfl: 2  atorvastatin (LIPITOR) 80 MG tablet, Take 1 tablet by mouth Daily after dinner, Disp: 90 tablet, Rfl: 3  polyethylene glycol (GLYCOLAX/MIRALAX) 17 g packet, Take 1 packet by mouth in the morning., Disp: 90 packet, Rfl: 3  docusate sodium (COLACE) 100 MG capsule, Take 1 capsule by mouth in the morning and 1 capsule before bedtime., Disp: 180 capsule, Rfl: 3  aspirin 81 MG chewable tablet, Take 1 tablet by mouth in the morning., Disp: 90 tablet, Rfl: 3  bisacodyl (DULCOLAX) 5 MG EC tablet, Take 8 tablets by mouth See Admin Instructions Take  as directed prior to colonoscopy, Disp: 8 tablet, Rfl: 0  buPROPion (WELLBUTRIN SR) 200 MG 12 hr tablet, Take 1 tablet by mouth in the morning and 1 tablet before bedtime., Disp: 180 tablet, Rfl: 3  solifenacin (VESICARE) 10 MG tablet, Take 1 tablet by mouth in the morning., Disp: , Rfl:   polyethylene glycol (GOLYTELY) 236 g suspension, Take 4,000 mLs by mouth See Admin Instructions Take as directed prior to your colonoscopy, Disp: 4000 mL, Rfl: 0  Simethicone 80 MG TABS, Take 4 tablets by mouth See Admin Instructions Take as directed prior to colonoscopy., Disp: 4 tablet, Rfl: 0  risedronate (ACTONEL) 150 MG tablet, Take 1 tablet by mouth every 30 (thirty) days with full glass of water on empty stomach-nothing else by mouth and do not lie down for 1/2 hour, Disp: 3 tablet, Rfl: 3  albuterol HFA 108 (90 Base) MCG/ACT inhaler, INHALE 2 PUFFS INTO THE LUNGS EVERY 6 HOURS AS NEEDED FOR WHEEZING OR SHORTNESS OF BREATH, Disp: 8.5 g, Rfl: 11  acetaminophen (TYLENOL) 500 MG tablet, Take 500 mg by mouth every 6 (six) hours as needed for Pain, Disp: , Rfl:   ipratropium-albuterol (DUO-NEB) 0.5-2.5 (3) MG/3ML SOLN Inhalation Solution, Take 3 mLs by nebulization 4 (four) times daily., Disp: 180 mL, Rfl: 0    No current facility-administered medications for this visit.    Review of Patient's  Allergies indicates:   Darvon                     Meperidine hcl              Comment:Nausea/vomit   Paxil [paroxetine]      Rash   Zoloft [sertraline *    Rash            Objective     LMP 11/20/1991               Plan   ***    We discussed the patient's current medications. The patient expressed understanding and no barriers to adherence were identified.  1. The patient indicates understanding of these issues and agrees with the plan.  Brief care plan is updated and reviewed with the patient.   2. The patient is given an After Visit Summary sheet that lists all medications with directions, allergies, orders placed during this encounter, and follow-up instructions.   3. I reviewed the patient's medical information and medical history   4. I reconciled the patient's medication list and prepared and supplied needed refills.   5. I have reviewed the past medical, family, and social history sections including the medications and allergies.    Hildred Laser, MD

## 2022-08-20 NOTE — Progress Notes (Signed)
Subjective:   Cheryl Dixon is a 67 year old female with COPD, opioid use disorder in remission, depression, 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), osteoporosis, OA of knees, overactive bladder, atrophic vaginitis on estrogen, who comes for follow up:    Atrophic vaginitis  Has had trouble getting the brand of topical estrogen she wants.  Mylan brand looks well.  Rolley Sims did not work quite as well.  Teva brand did not work well at all.  Had chills, difficulty sleeping, restlessness with brands that don't work as well.  However has [employer]well brand and it's working pretty well now.    Osteoporosis  Feels like vitamin D pills are constipating, and make her nauseated.  Taking one every 2-3 days.    OA right knee  Planning on having TKR  Had injection in March.  Needs to follow up on aortic aneurysm and make sure COPD is under control in order to get surgery    Aortic aneurysm  12/14/2021:  CTA done at [facility] because of stroke redemonstrated 4.4 cm  10/2021:  Stable at 4.4 cm.  02/2020: incidentally noted on PE protocol CT    Depression  Taking bupropion 200 mg  Has good days and bad days.  Today is a good day      06/11/2022     4:24 PM 12/19/2021     8:27 AM 01/12/2019    10:47 AM   PHQ-9 TOTAL SCORE   Doc FlowSheet Total Score MyChart Total Score  17 (BPA)      COPD  Taking Incruse (LAMA) without noted side effects.   Had Stiolto (LAMA/LABA) once which was better but didn't get a refill.  Breathing is a little harder in the morning.  "It's OK if I don't do anything"   Inside exercise is better than exercise.  Has completely quit smoking last December.    OUD  Taking suboxone regularly  Taking laxatives regularly to help with constipation.  Sleep problems  Used to take clonazepam tid.  Currently taking it once daily from a friend, 1 mg tabs, 0.5 - 1 tab nightly to sleep.  Tried doxepin, trazodone (ineffective), tylenol pm (urinary retention)  Melatonin (constipating, depression)  MJ edibles  work well but "comatose" the next day.  Trouble staying asleep more than getting to sleep, after she gets up at 1:30 to go to the bathroom.  Snores    CVA  Small vessel thrombotic stroke of left corona radiata.  Minimal residual loss of fine motor control in the right hand.  Taking ASA and atorvastatin 80  every day.  Holter: not done.  Neuro at [facility]:  continue ASA 81 at high dose statin, complete echo.  LOW DENSITY LIPOPROTEIN DIRECT   Date Value   12/12/2021 160 mg/dL   15/17/6160 737 mg/dL   10/62/6948 546 mg/dl (H)       Constipation  Controlled pretty well with senna and miralax  At last visit she was advised to increased miralax to tid for a few days, use and enema, and drink more water.  Didn't get colace recently.  Having a bowel movement every other day.    Urinary urgency  Solneficin continues to work well     BP 118/80   Pulse 63   Temp 97.7 F (36.5 C)   Ht 5' 1.42" (1.56 m)   Wt 90.7 kg (200 lb)   LMP 11/20/1991   SpO2 96%   BMI 37.28 kg/m  Physical Exam  Constitutional:       General: She is not in acute distress.  HENT:      Head: Normocephalic and atraumatic.   Eyes:      Conjunctiva/sclera: Conjunctivae normal.   Cardiovascular:      Rate and Rhythm: Normal rate and regular rhythm.   Pulmonary:      Effort: Pulmonary effort is normal.      Breath sounds: Normal breath sounds. No wheezing.   Neurological:      Mental Status: She is alert.           Counseling:     Health Maintenance: See Health Maintenance Report for details of screening done.    Health Maintenance Items Overdue and Due Soon  MAMMOGRAPHY due on 06/21/2010  RSV 60+(1 - 1-dose 60+ series) Never done  FALL RISK ASSESSMENT: OVER 65 Never done  HEALTH CARE PROXY due on 06/30/2022  PHQ-9 due on 09/09/2022    All Health Maintenance Items and DueDates -  MAMMOGRAPHY due on 06/21/2010  RSV 60+(1 - 1-dose 60+ series) Never done  FALL RISK ASSESSMENT: OVER 65 Never done  HEALTH CARE PROXY due on 06/30/2022  PHQ-9 due on 09/09/2022  CT  Lung Screening due on 11/07/2022  AWQ Questionnaire due on 12/28/2022  Colorectal Cancer Screening due on 04/11/2024  LIPID SCREENING due on 12/13/2026  TETANUS VACCINE(2 - Td or Tdap) due on 11/21/2031  BONE DENSITY due on 05/16/2036  PHYSICAL EXAM Completed  ZOSTER VACCINE Completed  HEP C SCREEN Completed  INFLUENZA VACCINE Completed  COVID-19 Vaccine Completed  PNEUMOCOCCAL VACCINE SERIES (< 65) Completed  PNEUMOCOCCAL VACCINE SERIES (65+) Completed  Cervical Cancer Screening Discontinued    Assessment & Plan:   Chronic obstructive pulmonary disease (HCC)  Stable on current inhalers.  Never had PFTs, but clinically has not had a lot of exacerbations or severe symptoms.  - will try again to get Stiolto (LAMA/LABA) and albuterol PRN.  If not continue Incruse  - referred for PFTs    Cerebrovascular accident (CVA) (HCC)  Minimal residual.  - continue lifelong ASA  - continue atorvastatin  - still needs to complete extended cardiac monitor + echo with bubble study - referred    Opioid dependence on agonist therapy (HCC)  - check UDS  Taking nonprescribed BDZ for sleep.  Discussion of alternatives for sleep deferred given multiple other issues to address.  Will call patient for follow up to discuss.  May need eval for OSA before trying sedatives.    Postmenopausal atrophic vaginitis  - continue topical estrogen    Aneurysm of ascending aorta without rupture (HCC)  - check echo in August, 1 year after last CTA at [facility]    Osteoporosis  - check vitamin D level.  Consider higher dose weekly vitamin D if low    Major depressive disorder, recurrent episode, severe (HCC)  Stable on bupropion, declines any changes to meds at this point.    I spent a total of 46 minutes on this visit on the date of service (total time includes all activities performed on the date of service)

## 2022-08-20 NOTE — Assessment & Plan Note (Signed)
Stable on bupropion, declines any changes to meds at this point.

## 2022-08-20 NOTE — Assessment & Plan Note (Signed)
-   continue topical estrogen

## 2022-08-20 NOTE — Assessment & Plan Note (Signed)
Minimal residual.  - continue lifelong ASA  - continue atorvastatin  - still needs to complete extended cardiac monitor + echo with bubble study - referred

## 2022-08-20 NOTE — Assessment & Plan Note (Signed)
-   check vitamin D level.  Consider higher dose weekly vitamin D if low

## 2022-08-21 ENCOUNTER — Telehealth (HOSPITAL_BASED_OUTPATIENT_CLINIC_OR_DEPARTMENT_OTHER): Payer: Self-pay

## 2022-08-21 LAB — LEAD LABCORP BLOOD ADULT: LEAD LABCORP BLOOD ADULT: <1 ug/dL (ref 0.0–3.4)

## 2022-08-21 NOTE — Telephone Encounter (Addendum)
Called and reached patient, advised her of message regarding results, she reports not taking atorvastatin consistently for past 2 months, reports she takes it but often has to stop for a few days, reports it causes constipation and when she stops med constipation resolves.  Prefers not to take this med.    Advised I would let provider know and get back to her with a plan.      ----- Message from Elodia Florence sent at 08/21/2022 11:44 AM EDT -----  Regarding: returning missed call  Haydn Cush [id number], 67 year old, female, Telephone Information:  Home Phone      [phone]  Work Phone      Not on file.  Mobile          [phone]           Calls today: Other: Pt returning missed call from RN    Person calling on behalf of patient: Patient (self)    CALL BACK NUMBER: [phone]      Patient's language of care: English    Patient does not need an interpreter.    Patient's PCP: Hildred Laser, MD

## 2022-08-21 NOTE — Telephone Encounter (Addendum)
Tried to reach patient, no answer, left message on voicemail to call clinic back.      ----- Message from Hildred Laser, MD sent at 08/20/2022 10:50 PM EDT -----  Dear RN,  Please let patient know:    Labs generally look good except for elevated cholesterol.  Please confirm she has been taking atorvastatin regularly daily for the last month.    - if so we should add a second med ezetimibe (Zetia) to get the cholesterol under control and reduce the change of another stroke  - if not take atorvastatin consistently and recheck in 1 month

## 2022-08-29 ENCOUNTER — Telehealth (HOSPITAL_BASED_OUTPATIENT_CLINIC_OR_DEPARTMENT_OTHER): Payer: Self-pay | Admitting: Internal Medicine

## 2022-08-29 NOTE — Telephone Encounter (Signed)
-----   Message from Burnard Leigh [facility] sent at 08/29/2022 10:11 AM EDT -----  Regarding: call back  Cheryl Dixon [id number], 67 year old, female    Calls today: Clinical Questions (NON-SICK CLINICAL QUESTIONS ONLY)    Person calling on behalf of patient: Patient (self)    [name] NUMBER: [phone]   Best time to call back: anytime   Cell phone:   Other phone:    Patient's language of care: English    Patient does not need an interpreter.    Patient's PCP: Hildred Laser, MD    Primary Care Home Site:  [facility]

## 2022-08-29 NOTE — Telephone Encounter (Signed)
Returned call and spoke to patient.  States,she received call from our office this morning,reviewed and advised nobody called her from our office this morning and if we did,we would leave a voice message with call back number if there is an option to leave message.  Patient than tells me,she is calling for results from last visit(4/24),advised she had reviewed this results on 4/25 with another RN,offered to review again.  States,her cholesterol is high but she did not told her the number,I offered to go through her lab numbers,patient became impatient and tells this nurse 'why doesn't she remember what the numbers are',I let her know that I need to review to make sure I provide her the accurate messages.    Patient tells me,she needs to discuss with Dr roll about her other multiple complaints ('knee pain and other stuff').  Declines to discuss with me stating,she does not want to explain the same thing to me and him again.  Offered appt,declines stating she does not want to see anyone else beside Dr Diona Foley.  Advised,I will relay this message to provider,voiced understanding.        ----- Message from Burnard Leigh [facility] sent at 08/29/2022 10:11 AM EDT -----  Regarding: call back  Anaiya Noto [id number], 67 year old, female    Calls today: Clinical Questions (NON-SICK CLINICAL QUESTIONS ONLY)    Person calling on behalf of patient: Patient (self)    CALL BACK NUMBER: [phone]   Best time to call back: anytime   Cell phone:   Other phone:    Patient's language of care: English    Patient does not need an interpreter.    Patient's PCP: Hildred Laser, MD    Primary Care Home Site:  [facility]

## 2022-09-02 ENCOUNTER — Ambulatory Visit (HOSPITAL_BASED_OUTPATIENT_CLINIC_OR_DEPARTMENT_OTHER): Payer: Self-pay | Admitting: Clinical

## 2022-09-02 ENCOUNTER — Ambulatory Visit (HOSPITAL_BASED_OUTPATIENT_CLINIC_OR_DEPARTMENT_OTHER): Payer: 59 | Admitting: Family Medicine

## 2022-09-05 NOTE — Telephone Encounter (Signed)
I left VM for patient.    - I spoke to patient.  She is taking atorvastatin again. We discussed the importance of this in preventing stroke.    - she has all of her tests scheduled.    -------------------------

## 2022-09-08 ENCOUNTER — Ambulatory Visit (HOSPITAL_BASED_OUTPATIENT_CLINIC_OR_DEPARTMENT_OTHER): Payer: No Typology Code available for payment source

## 2022-09-08 ENCOUNTER — Other Ambulatory Visit: Payer: Self-pay

## 2022-09-09 ENCOUNTER — Ambulatory Visit (HOSPITAL_BASED_OUTPATIENT_CLINIC_OR_DEPARTMENT_OTHER): Payer: No Typology Code available for payment source | Admitting: Clinical

## 2022-09-09 ENCOUNTER — Ambulatory Visit: Payer: No Typology Code available for payment source | Attending: Family Medicine | Admitting: Family Medicine

## 2022-09-09 DIAGNOSIS — F1120 Opioid dependence, uncomplicated: Secondary | ICD-10-CM | POA: Insufficient documentation

## 2022-09-09 MED ORDER — BUPRENORPHINE HCL-NALOXONE HCL 8-2 MG SL FILM
ORAL_FILM | SUBLINGUAL | 0 refills | Status: DC
Start: 2022-09-09 — End: 2022-10-07

## 2022-09-09 NOTE — Progress Notes (Signed)
Cheryl Dixon is a 67 year old female seen in follow up for opioid use disorder:    Buprenorphine dose: 20mg   Response, adequacy of dose: good  Relapses/close calls: none  Trigger: denies  Meetings: none currently    Social history/events:  Not doing well today  Lot of difficulty with her knee, also lungs, following up with her medical providers on this    Present Medications:  tiotropium-olodaterol (STIOLTO RESPIMAT) 2.5-2.5 MCG/ACT inhal, Inhale 2 puffs into the lungs in the morning., Disp: 3 each, Rfl: 3  hydrocortisone 2.5 % cream, Apply topically 2 (two) times daily To affected areas of scalp and ears, Disp: 60 g, Rfl: 1  estradiol (ESTRACE) 0.1 MG/GM vaginal cream, PLACE 5 G VAGINALLY 3 TIMES A WEEK MYLAN BRAND ONLY., Disp: 170 g, Rfl: 0  [DISCONTINUED] buprenorphine-naloxone (SUBOXONE) 8-2 MG sublingual film, Take 2.5 films under the tongue daily., Disp: 70 Film, Rfl: 0  VITAMIN D3 50 MCG (2000 UT) TABS tablet, TAKE 1 TABLET BY MOUTH EVERY DAY IN THE MORNING, Disp: 90 tablet, Rfl: 3  naproxen (EC-NAPROXEN) 500 MG EC tablet, TAKE 1 TABLET BY MOUTH IN THE MORNING AND IN THE EVENING WITH MEALS, Disp: 60 tablet, Rfl: 2  atorvastatin (LIPITOR) 80 MG tablet, Take 1 tablet by mouth Daily after dinner, Disp: 90 tablet, Rfl: 3  polyethylene glycol (GLYCOLAX/MIRALAX) 17 g packet, Take 1 packet by mouth in the morning., Disp: 90 packet, Rfl: 3  docusate sodium (COLACE) 100 MG capsule, Take 1 capsule by mouth in the morning and 1 capsule before bedtime., Disp: 180 capsule, Rfl: 3  aspirin 81 MG chewable tablet, Take 1 tablet by mouth in the morning., Disp: 90 tablet, Rfl: 3  bisacodyl (DULCOLAX) 5 MG EC tablet, Take 8 tablets by mouth See Admin Instructions Take as directed prior to colonoscopy, Disp: 8 tablet, Rfl: 0  buPROPion (WELLBUTRIN SR) 200 MG 12 hr tablet, Take 1 tablet by mouth in the morning and 1 tablet before bedtime., Disp: 180 tablet, Rfl: 3  solifenacin (VESICARE) 10 MG tablet, Take 1 tablet by mouth in the  morning., Disp: , Rfl:   polyethylene glycol (GOLYTELY) 236 g suspension, Take 4,000 mLs by mouth See Admin Instructions Take as directed prior to your colonoscopy, Disp: 4000 mL, Rfl: 0  Simethicone 80 MG TABS, Take 4 tablets by mouth See Admin Instructions Take as directed prior to colonoscopy., Disp: 4 tablet, Rfl: 0  risedronate (ACTONEL) 150 MG tablet, Take 1 tablet by mouth every 30 (thirty) days with full glass of water on empty stomach-nothing else by mouth and do not lie down for 1/2 hour, Disp: 3 tablet, Rfl: 3  albuterol HFA 108 (90 Base) MCG/ACT inhaler, INHALE 2 PUFFS INTO THE LUNGS EVERY 6 HOURS AS NEEDED FOR WHEEZING OR SHORTNESS OF BREATH, Disp: 8.5 g, Rfl: 11  acetaminophen (TYLENOL) 500 MG tablet, Take 500 mg by mouth every 6 (six) hours as needed for Pain, Disp: , Rfl:   ipratropium-albuterol (DUO-NEB) 0.5-2.5 (3) MG/3ML SOLN Inhalation Solution, Take 3 mLs by nebulization 4 (four) times daily., Disp: 180 mL, Rfl: 0    No current facility-administered medications on file prior to visit.          08/20/2022     2:30 PM 06/11/2022     4:24 PM 12/19/2021     8:27 AM   PHQ-9 TOTAL SCORE   Doc FlowSheet Total Score 15 12 17    MyChart Total Score   17 (BPA)  PHYSICAL EXAMINATION via video:   General appearance - healthy female in no distress  Neuro - nonfocal  Affect - normal  Behavior without evidence of intoxication or impairment    PMP Reviewed.  No unauthorized prescriptions since last refill.     Assessment:  Opioid Dependence  Comment: Stable in sobriety. Last UDS 08/20/22 +benzodiazepines, consistent for buprenorphine. States she is taking illicit benzodiazepines for sleep and is not interested in alternatives, aware of risks.   Plan:  Continue buprenorphine, reviewed criteria for tapering when patient is ready. Discussed sources of support.  Patient is counseled regarding relapse prevention, involvement in recovery groups, and potential side effects of buprenorphine.    For the patient  participating in the group via telephone and/or video technology: the patient/guardian has verbally consented to participate in this group therapy session by televisit. The patient/guardian was encouraged to be in a private location due to personal health information being discussed and where they could not be overheard by individuals not participating in the group therapy. Although all [facility] staff/providers participating in this group therapy televisit are in a private location, all the risks of telephone and/or video technology used to conduct this group therapy session cannot be controlled by [facility], (i.e. interruptions, unauthorized access/recording and technical difficulties).  Each patient understands that the visit can be stopped at any time for any reason, including if the conferencing connections are inadequate.     Azzie Glatter, MD, 09/09/2022

## 2022-09-12 MED ORDER — BUPIVACAINE HCL 0.5 % IJ SOLN
5.00 mL | Freq: Once | INTRAMUSCULAR | 0 refills | Status: AC
Start: 2022-09-12 — End: 2022-09-12

## 2022-09-12 MED ORDER — METHYLPREDNISOLONE ACETATE 40 MG/ML IJ SUSP
40.00 mg | Freq: Once | INTRAMUSCULAR | 0 refills | Status: AC
Start: 2022-09-12 — End: 2022-09-12

## 2022-09-12 NOTE — Progress Notes (Signed)
This patient is here for a follow-up visit regarding troubles with the right knee.    This patient has a longstanding history of degenerative arthritis of the right knee.  She has notable impact relative to activity of daily living.  She had been scheduled for total knee arthroplasty last year.  She put that surgery on hold.    Lately she has had more pain in the right knee.    Past Medical History:  No date: Anxiety  No date: Arthritis  No date: COPD (chronic obstructive pulmonary disease) (HCC)  No date: Depression  No date: Obese  No date: Substance addiction (HCC)  No date: Varicose veins of lower extremities    Past Surgical History:  No date: ENDOMETRIAL ABLTJ THERMAL W/O HYSTEROSCOPIC GID  No date: PR ANES HRNA REPAIR UPR ABD TABDL RPR DIPHRG HRNA  No date: PR ANES IPER LOWER ABD W/LAPS RAD HYSTERECTOMY  No date: RPR 1ST INGUN HRNA PRETERM INFT RDC    Review of Patient's Allergies indicates:   Darvon                     Meperidine hcl              Comment:Nausea/vomit   Paxil [paroxetine]      Rash   Zoloft [sertraline *    Rash    Medications reviewed in the record.    Within the last year she has quit smoking.    She remains on agonist therapy for opioid dependence.  She has been compliant with its use and doing well as best can be determined on review of the record.    On exam there is a slight varus deformity of the right knee.  Minor boggy fullness.  No skin abnormality.  Overall good mobility.  Crepitus under the patella with passive motion.    X-rays of the knee once again reviewed independently.  Right knee films show advanced degenerative change throughout the knee particular involving the medial compartment.  There is marked joint space narrowing, subchondral sclerosis with osteophyte formation.    The patient has degenerative arthritis of the right knee with ongoing symptoms.  I discussed with the patient steroid injection to calm things down at this point.  She would like to do so.  After  confirming proper patient and site to be injected, right knee injected with 40 mg Depo-Medrol solution and 5 cc Marcaine.  The patient will arrange for follow-up visit to see me in about 3 months.  She will consider moving forward with knee arthroplasty.

## 2022-09-16 ENCOUNTER — Ambulatory Visit: Payer: 59 | Admitting: Family Medicine

## 2022-09-19 ENCOUNTER — Other Ambulatory Visit: Payer: Self-pay

## 2022-09-19 ENCOUNTER — Ambulatory Visit
Admission: RE | Admit: 2022-09-19 | Discharge: 2022-09-19 | Disposition: A | Discharge status: Home / Self Care | Payer: No Typology Code available for payment source | Attending: Internal Medicine | Admitting: Internal Medicine

## 2022-09-19 ENCOUNTER — Ambulatory Visit (HOSPITAL_BASED_OUTPATIENT_CLINIC_OR_DEPARTMENT_OTHER): Payer: Self-pay

## 2022-09-19 DIAGNOSIS — I6330 Cerebral infarction due to thrombosis of unspecified cerebral artery: Secondary | ICD-10-CM | POA: Insufficient documentation

## 2022-09-23 ENCOUNTER — Ambulatory Visit (HOSPITAL_BASED_OUTPATIENT_CLINIC_OR_DEPARTMENT_OTHER): Payer: No Typology Code available for payment source | Admitting: Clinical

## 2022-09-23 ENCOUNTER — Ambulatory Visit (HOSPITAL_BASED_OUTPATIENT_CLINIC_OR_DEPARTMENT_OTHER): Payer: 59 | Admitting: Family Medicine

## 2022-09-25 ENCOUNTER — Encounter (HOSPITAL_BASED_OUTPATIENT_CLINIC_OR_DEPARTMENT_OTHER): Payer: No Typology Code available for payment source

## 2022-09-25 ENCOUNTER — Inpatient Hospital Stay (HOSPITAL_BASED_OUTPATIENT_CLINIC_OR_DEPARTMENT_OTHER): Admission: RE | Admit: 2022-09-25 | Payer: Self-pay | Source: Ambulatory Visit

## 2022-09-27 ENCOUNTER — Other Ambulatory Visit (HOSPITAL_BASED_OUTPATIENT_CLINIC_OR_DEPARTMENT_OTHER): Payer: Self-pay | Admitting: Internal Medicine

## 2022-09-27 NOTE — Telephone Encounter (Signed)
PER Patient (self), Cheryl Dixon is a 67 year old female has requested a refill of      - Actonel       Last Office Visit: 09/09/22 with Paduano, C       There are no preventive care reminders to display for this patient.     Other Med Adult:  Most Recent BP Reading(s)  08/20/22 : 118/80        Cholesterol (mg/dL)   Date Value   09/60/4540 212     LOW DENSITY LIPOPROTEIN DIRECT (mg/dL)   Date Value   98/02/9146 151     HIGH DENSITY LIPOPROTEIN (mg/dL)   Date Value   82/95/6213 47     TRIGLYCERIDES (mg/dL)   Date Value   08/65/7846 119         THYROID SCREEN TSH REFLEX FT4 (uIU/mL)   Date Value   08/20/2022 4.100         No results found for: "TSH"    HEMOGLOBIN A1C (%)   Date Value   08/20/2022 5.6       No results found for: "POCA1C"      INR (no units)   Date Value   12/12/2021 1.1   02/16/2007 1.0 (L)   07/03/2006 < 1.0 (L)       SODIUM (mmol/L)   Date Value   08/20/2022 141       POTASSIUM (mmol/L)   Date Value   08/20/2022 4.9           CREATININE (mg/dL)   Date Value   96/29/5284 0.9        Documented patient preferred pharmacies:    [id number] - [place],  - [address]  Phone: [phone] Fax: [phone]

## 2022-09-30 ENCOUNTER — Ambulatory Visit: Payer: 59 | Admitting: Family Medicine

## 2022-10-07 ENCOUNTER — Ambulatory Visit: Payer: No Typology Code available for payment source | Attending: Psychosomatic Medicine | Admitting: Clinical

## 2022-10-07 ENCOUNTER — Ambulatory Visit: Payer: No Typology Code available for payment source | Attending: Family Medicine | Admitting: Family Medicine

## 2022-10-07 ENCOUNTER — Ambulatory Visit: Payer: 59 | Admitting: Family Medicine

## 2022-10-07 ENCOUNTER — Encounter (HOSPITAL_BASED_OUTPATIENT_CLINIC_OR_DEPARTMENT_OTHER): Payer: Self-pay

## 2022-10-07 DIAGNOSIS — F3341 Major depressive disorder, recurrent, in partial remission: Secondary | ICD-10-CM | POA: Insufficient documentation

## 2022-10-07 DIAGNOSIS — F1120 Opioid dependence, uncomplicated: Secondary | ICD-10-CM | POA: Diagnosis present

## 2022-10-07 MED ORDER — BUPRENORPHINE HCL-NALOXONE HCL 8-2 MG SL FILM
ORAL_FILM | SUBLINGUAL | 0 refills | Status: DC
Start: 2022-10-07 — End: 2022-11-04

## 2022-10-07 NOTE — Progress Notes (Signed)
[facility] (OFFICE BASED OPIOID TREATMENT)  GROUP PROGRESS NOTE     Televisit consent, as applicable:  For the patient participating in the group via telephone and/or video technology: The patient/guardian has verbally consented to participate in this group therapy session by televisit. The patient/guardian was encouraged to be in a private location due to personal health information being discussed and where they could not be overheard by individuals not participating in the group therapy. Although all [facility] staff/providers participating in this group therapy televisit are in a private location, all the risks of telephone and/or video technology used to conduct this group therapy session cannot be controlled by , (i.e. interruptions, unauthorized access/recording and technical difficulties).  Each patient understands that the visit can be stopped at any time for any reason, including if the conferencing connections are inadequate.     GROUP NAME:  Office Based Opioid Treatment Group     LEADER(S): Horatio Pel, MD, Milta Deiters, Ph.D, and Boneta Lucks, RN     Cheryl Dixon is a 67 year old, Divorced, female, who presented for [facility] group visit.   Length of group: 60 min which included 30 min of Health Behavior Intervention and OUD Psychoeducation.     Service Type: [id number] Group Psychotherapy     Group Topic Today: "Window of Tolerance". Group members reviewed and discussed the concept of "Window of Tolerance" as the optimal zone of "arousal" for a person to function in everyday life. When a person is operating within this zone or window, they can effectively manage and cope with their emotions. Group members learned about hyperarousal,  also known as the "fight, flight, or freeze response", or a heightened state of activation/energy. Additionally, group members learned about hypoarousal, also known as the "shutdown" or "collapse" response. Group members reviewed examples of managing or widening their "Window of  Tolerance". Group members discussed strategies to reduce relapse risks and increase healthy coping mechanisms to support recovery and healthy change.      Number of participants in group today: 10        Purpose of Group (choose all that apply):   Support (psychological, family, community resources)   Education about Opioid Use Disorder (OUD) and treatment  Enhancing adjustment and coping with OUD  Improving management of OUD and treatment adherence  Supporting Recovery Process  Increasing health promoting behaviors  Decreasing health risk behaviors and risks of relapse  Addressing psychological/psychosocial/behavioral barriers to preventing addiction and relapse  Promotion of functional improvement              Group Process:    Group rules   Review of group goals/structure  Group discussion and sharing.    Review of strategies to increase healthy coping mechanisms to support recovery and change.  Review of additional relapse prevention strategies.   Mindfulness and self-compassion techniques introduced.  Discussion of support   Check in    Individual Patient Participation:     Level of participation in the group:    Pt appeared attentive to group process and listened to group discussion      Subjective report of pt's progress: Pt reported doing well, however, she is in a lot of pain today. She will go to take her X-rays soon. She is enjoying the group discussion today. Continues staying sober.    Social History update: no major updates reported  Participation in meetings outside of this group: Not currently  Report on relapses and close calls: No relapses/close calls disclosed.    Affect  seems Euthymic.   Relevant changes in mental status: No major changes reported or observed      Medical Necessity of Session (how treatment is necessary to improve symptoms, functioning, or prevent worsening): treatment in necessary to enhance compliance with agonist treatment and maintain abstinence from opioids. Suboxone group  participation is mandatory to continue Suboxone prescription and is necessary in order to enhance coping with Opioid Use Disorder.      Risk Level per Scale:  Suicide: low  Violence: low  Addiction: low/moderate    Current risk level represents increase in risk: No.    Reviewing Today's Visit:  Clinical Interventions Today: Motivational Interviewing; Cognitive Behavioral Therapy (CBT)  Time spent in Psychotherapy: 60 min  Patient's Response to Interventions: Responsive    DIAGNOSES:    Opioid dependence on agonist therapy (HCC)  (primary encounter diagnosis)  MDD (major depressive disorder), recurrent, in partial remission (HCC)    Plan: Pt will continue [facility] treatment     Milta Deiters, Ph.D.

## 2022-10-07 NOTE — BH OP Treatment Plan (Signed)
Patient/Guardian:  contributed to the creation of the treatment plan.    Strengths/Skills: Help seeking and motivated to get better     Potential Barriers: Multiple life stressors       Patient stated goals: Pt would like to prevent relapses and support her recovery process     Problem 1: Opioid Dependence on Agonist Therapy (Opioid Use Disorder on Agonist Therapy)      Short Term Goals: Pt will use at least one copying skill a day to reduce a risk of relapse, enhance coping with OUD, and support her recovery process     Short Term Target:  60 days  Short TermTarget Date: 12/06/2022  Short Term Goal #1 Progress: progressing     Long Term Goals: Pt will report 50% in urges and cravings reduction and no relapses or close calls       Long Term Target:  90 days  Long TermTarget Date:  01/05/2023   Long Term Goal #1 Progress:  progressing      Intervention #1: [facility]-based OBOT/Group Psychotherapy   Intervention Frequency:  biweekly  Intervention Duration:  90 Days  Intervention Responsibility:  J. Maryann Conners, PhD and the OBOT team

## 2022-10-07 NOTE — Progress Notes (Signed)
Loleta Frommelt is a 67 year old female seen in follow up for opioid use disorder:    Buprenorphine dose: 20mg  daily  Response, adequacy of dose: good  Relapses/close calls: none reported  Non-prescribed substance use: benzodiazepines for insomnia  Meetings: q4wks    Social history/events:  Doing well, no acute events    Present Medications:  risedronate (ACTONEL) 150 MG tablet, Take 1 tablet by mouth every 30 (thirty) days with full glass of water on empty stomach-nothing else by mouth and do not lie down for 1/2 hour, Disp: 3 tablet, Rfl: 3  buprenorphine-naloxone (SUBOXONE) 8-2 MG sublingual film, Take 2.5 films under the tongue daily., Disp: 70 Film, Rfl: 0  tiotropium-olodaterol (STIOLTO RESPIMAT) 2.5-2.5 MCG/ACT inhal, Inhale 2 puffs into the lungs in the morning., Disp: 3 each, Rfl: 3  hydrocortisone 2.5 % cream, Apply topically 2 (two) times daily To affected areas of scalp and ears, Disp: 60 g, Rfl: 1  VITAMIN D3 50 MCG (2000 UT) TABS tablet, TAKE 1 TABLET BY MOUTH EVERY DAY IN THE MORNING, Disp: 90 tablet, Rfl: 3  naproxen (EC-NAPROXEN) 500 MG EC tablet, TAKE 1 TABLET BY MOUTH IN THE MORNING AND IN THE EVENING WITH MEALS, Disp: 60 tablet, Rfl: 2  atorvastatin (LIPITOR) 80 MG tablet, Take 1 tablet by mouth Daily after dinner, Disp: 90 tablet, Rfl: 3  polyethylene glycol (GLYCOLAX/MIRALAX) 17 g packet, Take 1 packet by mouth in the morning., Disp: 90 packet, Rfl: 3  docusate sodium (COLACE) 100 MG capsule, Take 1 capsule by mouth in the morning and 1 capsule before bedtime., Disp: 180 capsule, Rfl: 3  aspirin 81 MG chewable tablet, Take 1 tablet by mouth in the morning., Disp: 90 tablet, Rfl: 3  bisacodyl (DULCOLAX) 5 MG EC tablet, Take 8 tablets by mouth See Admin Instructions Take as directed prior to colonoscopy, Disp: 8 tablet, Rfl: 0  buPROPion (WELLBUTRIN SR) 200 MG 12 hr tablet, Take 1 tablet by mouth in the morning and 1 tablet before bedtime., Disp: 180 tablet, Rfl: 3  solifenacin (VESICARE) 10 MG  tablet, Take 1 tablet by mouth in the morning., Disp: , Rfl:   polyethylene glycol (GOLYTELY) 236 g suspension, Take 4,000 mLs by mouth See Admin Instructions Take as directed prior to your colonoscopy, Disp: 4000 mL, Rfl: 0  Simethicone 80 MG TABS, Take 4 tablets by mouth See Admin Instructions Take as directed prior to colonoscopy., Disp: 4 tablet, Rfl: 0  albuterol HFA 108 (90 Base) MCG/ACT inhaler, INHALE 2 PUFFS INTO THE LUNGS EVERY 6 HOURS AS NEEDED FOR WHEEZING OR SHORTNESS OF BREATH, Disp: 8.5 g, Rfl: 11  acetaminophen (TYLENOL) 500 MG tablet, Take 500 mg by mouth every 6 (six) hours as needed for Pain, Disp: , Rfl:   ipratropium-albuterol (DUO-NEB) 0.5-2.5 (3) MG/3ML SOLN Inhalation Solution, Take 3 mLs by nebulization 4 (four) times daily., Disp: 180 mL, Rfl: 0    No current facility-administered medications on file prior to visit.      Review of Systems:      08/20/2022     2:30 PM 06/11/2022     4:24 PM 12/19/2021     8:27 AM   PHQ-9 TOTAL SCORE   Doc FlowSheet Total Score 15 12 17    MyChart Total Score   17 (BPA)           PHYSICAL EXAMINATION:  General appearance - healthy female in no distress  Eyes - pupils nml  Skin - warm and dry  Neuro - nonfocal  Affect - normal      Assessment:  Opioid use disorder  Comment: stable  Plan:  continue current regimen  Patient is counseled regarding relapse prevention, involvement in recovery groups, and potential side effects of buprenorphine.    Horatio Pel, MD

## 2022-10-09 ENCOUNTER — Ambulatory Visit: Payer: No Typology Code available for payment source | Attending: Family Medicine | Admitting: Physician Assistant

## 2022-10-09 ENCOUNTER — Telehealth (HOSPITAL_BASED_OUTPATIENT_CLINIC_OR_DEPARTMENT_OTHER): Payer: Self-pay | Admitting: Internal Medicine

## 2022-10-09 ENCOUNTER — Encounter (HOSPITAL_BASED_OUTPATIENT_CLINIC_OR_DEPARTMENT_OTHER): Payer: Self-pay

## 2022-10-09 ENCOUNTER — Other Ambulatory Visit: Payer: Self-pay

## 2022-10-09 ENCOUNTER — Ambulatory Visit (HOSPITAL_BASED_OUTPATIENT_CLINIC_OR_DEPARTMENT_OTHER): Payer: Self-pay | Admitting: Registered Nurse

## 2022-10-09 ENCOUNTER — Telehealth: Payer: Self-pay | Admitting: Orthopaedic Surgery

## 2022-10-09 VITALS — BP 121/87 | HR 74 | Temp 97.3°F | Wt 193.0 lb

## 2022-10-09 DIAGNOSIS — R051 Acute cough: Secondary | ICD-10-CM | POA: Diagnosis present

## 2022-10-09 DIAGNOSIS — J441 Chronic obstructive pulmonary disease with (acute) exacerbation: Secondary | ICD-10-CM | POA: Insufficient documentation

## 2022-10-09 DIAGNOSIS — J449 Chronic obstructive pulmonary disease, unspecified: Secondary | ICD-10-CM

## 2022-10-09 LAB — COVID-19 OUTPATIENT: COVID-19 OUTPATIENT: NEGATIVE

## 2022-10-09 MED ORDER — CETIRIZINE HCL 10 MG PO TABS
10.0000 mg | ORAL_TABLET | Freq: Every day | ORAL | 0 refills | Status: DC
Start: 2022-10-09 — End: 2023-01-04

## 2022-10-09 MED ORDER — AZITHROMYCIN 250 MG PO TABS
ORAL_TABLET | ORAL | 0 refills | Status: AC
Start: 2022-10-09 — End: 2022-10-16

## 2022-10-09 MED ORDER — PREDNISONE 20 MG PO TABS
40.00 mg | ORAL_TABLET | Freq: Every day | ORAL | 0 refills | Status: AC
Start: 2022-10-09 — End: 2022-10-14

## 2022-10-09 NOTE — Telephone Encounter (Signed)
Patient aware CD is ready for pick up at front desk 

## 2022-10-09 NOTE — Telephone Encounter (Signed)
Received call from patient. She needs spine xray from 02/13/2021 copied to CD. Please call when ready and place note for patient to sign authorization. Pts ph [phone]

## 2022-10-09 NOTE — Telephone Encounter (Signed)
Reason for Disposition  . Patient wants to be seen    Answer Assessment - Initial Assessment Questions  Pt with COPD  States she called ambulance last Friday but refused to go to ER for CP difficulty breathing--"wasn't that bad"  Per PT- EMS advised her O2 SATs were good mild wheezing low grade temp  Now with fever this am of 100.2 PO  Productive cough yellow brown sputum  No CP  No syncope or jaw pain no arm pain  Bilateral shoulder pain x 2 years  No sore throat or sinus pain  No headache  No ear pain  No visual changes  No N/V/D  Wheezing at night  No numbness or weakness  No palpitations  No other family members ill  Using her Neb inhalers as ordered    Protocols used: Fever-A-OH  Advised per nursing triage protocol.  Verbalized understanding and agreement with instructions and disposition.     Recommended disposition for patient:Disposition: See in Office Today    If patient referred to UC/ED advised that they may require further follow up and testing after the visit with their primary care office.     Instructed patient to call back for any new, worsening, or worrisome symptoms or concerns any time day or night.

## 2022-10-09 NOTE — Telephone Encounter (Signed)
-----   Message from Garfield Cornea sent at 10/09/2022 11:18 AM EDT -----  Regarding: Needs f/u Monday  Cheryl Dixon  [phone]  [facility] [[id number]]    Please assist this patient with the following:    Schedule in-person visit for COPD exacerbation, time frame: Monday 10/13/22 or Tues 10/14/22, with any provider

## 2022-10-09 NOTE — Progress Notes (Signed)
Subjective:   CC: Cheryl Dixon is a 67 year old female patient who presents today for cough  LANGUAGE: English used effectively by patient.    HISTORY OF PRESENT ILLNESS:  Sick Visit  Per RN triage 10/09/22:  "Answer Assessment - Initial Assessment Questions  Pt with COPD  States she called ambulance last Friday but refused to go to ER for CP difficulty breathing--"wasn't that bad"  Per PT- EMS advised her O2 SATs were good mild wheezing low grade temp  Now with fever this am of 100.2 PO  Productive cough yellow brown sputum  No CP  No syncope or jaw pain no arm pain  Bilateral shoulder pain x 2 years  No sore throat or sinus pain  No headache  No ear pain  No visual changes  No N/V/D  Wheezing at night  No numbness or weakness  No palpitations  No other family members ill  Using her Neb inhalers as ordered "    Today:   - Symptoms started Friday- not so bad, went away, came back Mon/Tues  - Progressed last night- 100F temperature  - +cough with yellow/green sputum  - +increased shortness of breath from baseline, +wheezing  - +muscle soreness in UE especially  - +sore throat  - +headache, +dizziness  - +chest tight  - Not sleeping well  - No abd pain, nausea, vomiting, or diarrhea  - Former smoker (quit 03/2022)  - If walks a few feet get SOB  - hx COPD, opioid use disorder in remission, depression, 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)    Current med regimen (endorses compliance):  tiotropium-olodaterol (STIOLTO RESPIMAT) 2.5-2.5 MCG/ACT inhal  albuterol HFA 108 (90 Base) MCG/ACT inhaler     PAST MEDICAL HISTORY:  Past Medical History:  No date: Anxiety  No date: Arthritis  No date: COPD (chronic obstructive pulmonary disease) (HCC)  No date: Depression  No date: Obese  No date: Substance addiction (HCC)  No date: Varicose veins of lower extremities    REVIEW OF SYSTEMS:   See HPI    Objective:     PHYSICAL EXAM:   BP 121/87   Pulse 74   Temp 97.3 F (36.3 C) (Temporal)   Wt 87.5 kg  (193 lb)   LMP 11/20/1991   SpO2 96%   BMI 35.97 kg/m   Gen: Well-appearing, speaking normally, in no acute distress  HEENT: Head atraumatic  Pulm: No increased work of breathing, lungs clear to auscultation, no wheezes/rales/rhonchi  Neuro: Alert and oriented, normal gait   Psych: Affect euthymic, normal speech    Assessment & Plan:   ASSESSMENT AND PLAN:  #Acute cough  #COPD with acute exacerbation (HCC)  Given COPD hx and +increased sputum production and increased +dyspnea, will treat for COPD exacerbation with corticosteroid burst x 5 days and z-pack. Will also order CXR to r/o PNA (although low suspicion given lungs CTAB and afebrile today). Will also Rx cetirizine daily to limit allergy contribution to sx  Will test for covid today as well   Plan:   - azithromycin (ZITHROMAX) 250 MG tablet,         predniSONE (DELTASONE) 20 MG tablet, XR CHEST 2        VIEWS, COVID-19 OUTPATIENT, cetirizine (ZYRTEC)        10 MG tablet  - F/u Mon 6/17 or Tues 6/18 for re-eval  - Informed patient to seek immediate medical attention if she develops difficulty breathing, fevers/chills, worsening/persistent CP, or  other concerning sx. Patient expressed understanding and agreed    The patient expressed understanding and no barriers to adherence were identified. Discussed reasons to call office or seek immediate medical care including: no improvement or worsening of symptoms, new concerns, or questions.     The patient indicates understanding of these issues and agrees with the plan.     I reconciled the patient's medication list and supplied needed refills.     The patient was given an After Visit Summary.    Garfield Cornea, PA-C 10/09/2022

## 2022-10-09 NOTE — Telephone Encounter (Signed)
Left message to return call to the office. Please schedule appointment per providers request.

## 2022-10-10 ENCOUNTER — Encounter (HOSPITAL_BASED_OUTPATIENT_CLINIC_OR_DEPARTMENT_OTHER): Payer: Self-pay | Admitting: Internal Medicine

## 2022-10-10 NOTE — Telephone Encounter (Signed)
Left message to return call to the office. Please schedule appointment per providers request.   Letter forwarded to front desk to mail to patient.

## 2022-10-11 NOTE — Progress Notes (Signed)
Please send letter with test results and this comment:    Your heart monitor was basically normal. The were some bursts of rapid heartbeats, but they were all very short, less thatn 10 few seconds.  Nothing to show that an abnormal heart beat caused your stroke.

## 2022-10-14 ENCOUNTER — Ambulatory Visit: Payer: 59 | Admitting: Family Medicine

## 2022-10-15 ENCOUNTER — Encounter (HOSPITAL_BASED_OUTPATIENT_CLINIC_OR_DEPARTMENT_OTHER): Payer: Self-pay

## 2022-10-16 ENCOUNTER — Encounter (HOSPITAL_BASED_OUTPATIENT_CLINIC_OR_DEPARTMENT_OTHER): Payer: No Typology Code available for payment source

## 2022-10-17 ENCOUNTER — Ambulatory Visit (HOSPITAL_BASED_OUTPATIENT_CLINIC_OR_DEPARTMENT_OTHER): Payer: 59 | Admitting: Specialist

## 2022-10-20 ENCOUNTER — Telehealth (HOSPITAL_BASED_OUTPATIENT_CLINIC_OR_DEPARTMENT_OTHER): Payer: Self-pay | Admitting: Internal Medicine

## 2022-10-20 ENCOUNTER — Telehealth (HOSPITAL_BASED_OUTPATIENT_CLINIC_OR_DEPARTMENT_OTHER): Payer: Self-pay | Admitting: Registered Nurse

## 2022-10-20 NOTE — Telephone Encounter (Signed)
Spoke to [place] after she placed a call with suboxone questions to the clinic.    She at first had thoughts that the pharmacy may have been changing her vendors of the generic brand she gets.  But she doesn't think that's the case any longer.  She also spoke about having stopped taking a prescribed benzo in March.  Doesnt think that could still be bothering her, as she weaned appropriately but unsure.  She doses one strip in the morning, one in the afternoon, and 1/2 at hs.  She said she was waking up sweaty and feeling withdrawal symptoms.  I suggested to do a trial of taking her 1/2 strip an midday and a whole strip before bed to see if that holds her better at night.    She is going to try and report back.    Also reminded her that she is welcome to come on group more frequently to discuss if she feels the need.   She also states she forgot to give a urine at her last visit to the clinic.  I encouraged her to please do so.    Will continue to follow.

## 2022-10-20 NOTE — Telephone Encounter (Signed)
-----   Message from [place] P sent at 10/20/2022  9:13 AM EDT -----  Regarding: requesting to speak to PCP/RN  Daytona Hedman [phone], 67 year old, female, Telephone Information:  Home Phone      [phone]  Work Phone      Not on file.  Mobile          [phone]           Calls today: Other: Pt called--would like to speak to RN/PCP-    Person calling on behalf of patient: Patient (self)    CALL BACK NUMBER: [phone]    Patient's language of care: English    Patient does not need an interpreter.    Patient's PCP: Hildred Laser, MD

## 2022-10-20 NOTE — Telephone Encounter (Signed)
Returned call and spoke to patient.  Reports,the suboxone she received this time is not the same as she had before,she received the Suboxone with the ' different generic name in a 'yellow box'.  States she is experiencing withdrawal symptoms as a result for the past three days.  Requesting to discuss this either with Karla,Dr Roll or someone who is in charge of suboxone.  Denies taking or stopping any other medications at this time,states she is sure these withdrawal symptoms are  from switching suboxone to the different generic.  Reports,she feels 'chills,headache and all',states she knows 'eaxctly' what  suboxone withdrawal symptoms are.  Advised,I will relay this message to suboxone group provider and the team.            ----- Message from Louanne Skye sent at 10/20/2022  9:13 AM EDT -----  Regarding: requesting to speak to PCP/RN  Cheryl Dixon [phone], 67 year old, female, Telephone Information:  Home Phone      [phone]  Work Phone      Not on file.  Mobile          [phone]           Calls today: Other: Pt called--would like to speak to RN/PCP-    Person calling on behalf of patient: Patient (self)    CALL BACK NUMBER: [phone]    Patient's language of care: English    Patient does not need an interpreter.    Patient's PCP: Hildred Laser, MD

## 2022-10-21 ENCOUNTER — Ambulatory Visit: Payer: 59 | Admitting: Family Medicine

## 2022-10-23 ENCOUNTER — Inpatient Hospital Stay (HOSPITAL_BASED_OUTPATIENT_CLINIC_OR_DEPARTMENT_OTHER): Admit: 2022-10-23 | Payer: Self-pay

## 2022-10-23 ENCOUNTER — Other Ambulatory Visit (HOSPITAL_BASED_OUTPATIENT_CLINIC_OR_DEPARTMENT_OTHER): Payer: Self-pay | Admitting: Internal Medicine

## 2022-10-23 DIAGNOSIS — I639 Cerebral infarction, unspecified: Secondary | ICD-10-CM

## 2022-10-25 ENCOUNTER — Other Ambulatory Visit (HOSPITAL_BASED_OUTPATIENT_CLINIC_OR_DEPARTMENT_OTHER): Payer: Self-pay | Admitting: Internal Medicine

## 2022-10-25 NOTE — Telephone Encounter (Signed)
PER Pharmacy, Cheryl Dixon is a 67 year old female has requested a refill of naproxen.      Last Office Visit:  10/09/22 with Alicia Amel  Last Physical Exam: 05/27/13    There are no preventive care reminders to display for this patient.    Other Med Adult:  Most Recent BP Reading(s)  10/09/22 : 121/87        Cholesterol (mg/dL)   Date Value   09/81/1914 212     LOW DENSITY LIPOPROTEIN DIRECT (mg/dL)   Date Value   78/29/5621 151     HIGH DENSITY LIPOPROTEIN (mg/dL)   Date Value   30/86/5784 47     TRIGLYCERIDES (mg/dL)   Date Value   69/62/9528 119         THYROID SCREEN TSH REFLEX FT4 (uIU/mL)   Date Value   08/20/2022 4.100         No results found for: "TSH"    HEMOGLOBIN A1C (%)   Date Value   08/20/2022 5.6       No results found for: "POCA1C"      INR (no units)   Date Value   12/12/2021 1.1   02/16/2007 1.0 (L)   07/03/2006 < 1.0 (L)       SODIUM (mmol/L)   Date Value   08/20/2022 141       POTASSIUM (mmol/L)   Date Value   08/20/2022 4.9           CREATININE (mg/dL)   Date Value   41/32/4401 0.9       Documented patient preferred pharmacies:    [id number] - [place], [place] - [address]  Phone: [phone] Fax: [phone]

## 2022-10-27 ENCOUNTER — Encounter (HOSPITAL_BASED_OUTPATIENT_CLINIC_OR_DEPARTMENT_OTHER): Payer: Self-pay | Admitting: Pediatrics

## 2022-10-27 ENCOUNTER — Encounter (HOSPITAL_BASED_OUTPATIENT_CLINIC_OR_DEPARTMENT_OTHER): Payer: Self-pay | Admitting: Registered Nurse

## 2022-10-27 ENCOUNTER — Other Ambulatory Visit (HOSPITAL_BASED_OUTPATIENT_CLINIC_OR_DEPARTMENT_OTHER): Payer: Self-pay | Admitting: Internal Medicine

## 2022-10-27 ENCOUNTER — Telehealth (HOSPITAL_BASED_OUTPATIENT_CLINIC_OR_DEPARTMENT_OTHER): Payer: Self-pay | Admitting: Registered Nurse

## 2022-10-27 ENCOUNTER — Other Ambulatory Visit: Payer: Self-pay

## 2022-10-27 ENCOUNTER — Ambulatory Visit: Payer: No Typology Code available for payment source | Attending: Pediatrics | Admitting: Pediatrics

## 2022-10-27 VITALS — BP 147/91 | HR 86 | Temp 97.8°F | Wt 193.8 lb

## 2022-10-27 DIAGNOSIS — N3281 Overactive bladder: Secondary | ICD-10-CM | POA: Diagnosis present

## 2022-10-27 DIAGNOSIS — F1120 Opioid dependence, uncomplicated: Secondary | ICD-10-CM | POA: Diagnosis present

## 2022-10-27 DIAGNOSIS — I10 Essential (primary) hypertension: Secondary | ICD-10-CM | POA: Insufficient documentation

## 2022-10-27 DIAGNOSIS — R300 Dysuria: Secondary | ICD-10-CM | POA: Insufficient documentation

## 2022-10-27 DIAGNOSIS — N952 Postmenopausal atrophic vaginitis: Secondary | ICD-10-CM | POA: Insufficient documentation

## 2022-10-27 LAB — OXYCODONE SCREEN URINE: OXYCOD SCRN URINE: NEGATIVE ng/mL

## 2022-10-27 LAB — URINALYSIS RFLX TO URINE CULT
BILIRUBIN, URINE: NEGATIVE
CASTS: NONE SEEN PER LPF
CRYSTALS: NONE SEEN
GLUCOSE, URINE: NEGATIVE MG/DL
KETONE, URINE: NEGATIVE MG/DL
LEUKOCYTE ESTERASE: NEGATIVE
NITRITE, URINE: NEGATIVE
PH URINE: 7 (ref 5.0–8.0)
PROTEIN, URINE: NEGATIVE MG/DL
SPECIFIC GRAVITY URINE: 1.008 (ref 1.003–1.035)

## 2022-10-27 LAB — POC URINALYSIS
BILIRUBIN, URINE: NEGATIVE
GLUCOSE,URINE: NEGATIVE
KETONE, URINE: NEGATIVE
LEUKOCYTE ESTERASE: NEGATIVE
NITRITE, URINE: NEGATIVE
PH URINE: 7 (ref 5.0–8.0)
PROTEIN, URINE: NEGATIVE
SPECIFIC GRAVITY, URINE: 1.01 (ref 1.003–1.030)
UROBILINOGEN URINE: 0.2 (ref 0.2–1.0)

## 2022-10-27 LAB — BENZODIAZEPINES URINE: BENZODIAZEPINES URINE: NEGATIVE ng/mL

## 2022-10-27 LAB — BUPRENORPHINE SCREEN URINE: BUPRENORPHINE SCREEN URINE: POSITIVE ng/mL — AB

## 2022-10-27 LAB — FENTANYL URINE: FENTANYL URINE: NEGATIVE ng/mL

## 2022-10-27 LAB — AMPHETAMINES URINE: AMPHETAMINES URINE: NEGATIVE ng/mL

## 2022-10-27 LAB — OPIATES URINE: OPIATES URINE: NEGATIVE ng/mL

## 2022-10-27 LAB — METHADONE URINE: METHADONE URINE: NEGATIVE ng/mL

## 2022-10-27 LAB — SPECIMEN VALIDITY URINE
SPEC VALIDITY SPECIFIC GRAVITY: 1.01 (ref 1.003–1.035)
SPECIMEN VALIDITY URINE CREAT: 35 mg/dL (ref >20)
SPECIMEN VALIDITY URINE PH: 7.3 (ref 5.0–8.5)

## 2022-10-27 LAB — URINE CULTURE WILL NOT BE DONE

## 2022-10-27 LAB — COCAINE METABOLITES URINE: COCAINE METABOLITES URINE: NEGATIVE ng/mL

## 2022-10-27 LAB — CANNABINOIDS URINE: CANNABINOIDS URINE: POSITIVE ng/mL — AB

## 2022-10-27 MED ORDER — ESTROGENS CONJUGATED 0.625 MG/GM VA CREA
0.5000 g | TOPICAL_CREAM | Freq: Every day | VAGINAL | 0 refills | Status: DC
Start: 2022-10-27 — End: 2022-11-13

## 2022-10-27 NOTE — Assessment & Plan Note (Signed)
I reviewed the Cheryl Dixon urology note from August 2023 she reports that Solifenacin has helped her symptoms but she is still suffering.  I advised that she schedule another appointment with them as she is overdue.

## 2022-10-27 NOTE — Assessment & Plan Note (Signed)
Unfortunately there was not enough urine today to collect both the urinalysis with a urine drug screen.  She attempted to urinate again but was unable to do so.

## 2022-10-27 NOTE — Assessment & Plan Note (Signed)
Currently not taking any medications.  She reports frequently getting high blood pressures in the office but they typically decrease at the end of the visit.  Her blood pressure is still above goal on repeat.  She will return in a few weeks for recheck and discussion.

## 2022-10-27 NOTE — Telephone Encounter (Signed)
Message sent to pt with Dr. Sung Amabile message.

## 2022-10-27 NOTE — Patient Instructions (Addendum)
Call [phone] to schedule the urology visit! Please let us know if you are unable to schedule a visit    We will be in touch about your urine results in 1-2 days!    It would be great to have you come back for a blood pressure check!

## 2022-10-27 NOTE — Telephone Encounter (Signed)
Pt called because she saw there was blood in her urine on MyChart- asking for Rx for abx, in a lot of pain and having trouble going  States this has been ongoing for a few years now  Discussed results were not indicative of UTI but per Dr. Sung Amabile notes and previous hx, likely needs to be seen by urology again  Seeking referral for urology, last referred in June of 2023  She is Asking that I loop Dr. Diona Foley in as he is PCP

## 2022-10-27 NOTE — Assessment & Plan Note (Signed)
She reports chronic dysuria worse in the past week without any abdominal pain vaginal discharge or fevers.  She reports drinking lots of water.  She reports being frustrated that she has never prescribed antibiotics.  I explained that the symptoms are sometimes not due to bacteria, and that her urine dip did not show any signs of bacterial infection today.  Will send for urinalysis with reflex to culture.

## 2022-10-27 NOTE — Telephone Encounter (Signed)
Patient has a urologist at [facility] (last seen 11 months ago) and she can call them at [phone] to schedule an appointment.    Please review that our office urine test for blood is often a false positive, so we will wait to see what the lab says. Our test was negative for white blood cells and bacteria, which are signs of urine infection.    If she has severe pain, fever, or vomiting she should go to the ED, but I do not think that this will happen at all.     Thanks!  Macario Golds, MD

## 2022-10-27 NOTE — Assessment & Plan Note (Addendum)
She reports that her vaginal dryness which she notes might be connected to her dysuria has worsened since she was switched to the generic estrogen vaginal cream.  I explained that sometimes insurance decides this and not the pharmacy nor provider.  I will attempt to prescribe the brand name estrogen cream for her.  She notes that she has been taking the estrogen cream more often and (daily), so I prescribed a lower-dose estrogen cream.  She reports having a hysterectomy so there is no risk of endometrial carcinoma.

## 2022-10-27 NOTE — Progress Notes (Signed)
Cheryl Dixon is a 67 year old female who presents for a follow-up office visit. Today we discussed the following issue(s):     Problem List Items Addressed This Visit          Cardiac and Vasculature    Essential hypertension     Currently not taking any medications.  She reports frequently getting high blood pressures in the office but they typically decrease at the end of the visit.  Her blood pressure is still above goal on repeat.  She will return in a few weeks for recheck and discussion.            Genitourinary and Reproductive    Chronic dysuria     She reports chronic dysuria worse in the past week without any abdominal pain vaginal discharge or fevers.  She reports drinking lots of water.  She reports being frustrated that she has never prescribed antibiotics.  I explained that the symptoms are sometimes not due to bacteria, and that her urine dip did not show any signs of bacterial infection today.  Will send for urinalysis with reflex to culture.         Relevant Orders    URINALYSIS RFLX TO URINE CULT    Overactive bladder     I reviewed the [facility] urology note from August 2023 she reports that Solifenacin has helped her symptoms but she is still suffering.  I advised that she schedule another appointment with them as she is overdue.         Postmenopausal atrophic vaginitis     She reports that her vaginal dryness which she notes might be connected to her dysuria has worsened since she was switched to the generic estrogen vaginal cream.  I explained that sometimes insurance decides this and not the pharmacy nor provider.  I will attempt to prescribe the brand name estrogen cream for her.  She notes that she has been taking the estrogen cream more often and (daily), so I prescribed a lower-dose estrogen cream.  She reports having a hysterectomy so there is no risk of endometrial carcinoma.            Mental Health    Opioid dependence on agonist therapy (HCC)     Unfortunately there was not enough  urine today to collect both the urinalysis with a urine drug screen.  She attempted to urinate again but was unable to do so.         Relevant Orders    AMPHETAMINES URINE    BENZODIAZEPINES URINE    BUPRENORPHINE SCREEN URINE    COCAINE METABOLITES URINE    METHADONE URINE    OPIATES URINE    OXYCODONE SCREEN URINE    CANNABINOIDS URINE    FENTANYL URINE    SPECIMEN VALIDITY URINE         REVIEW OF SYSTEMS: negative or noted above    Current Outpatient Medications   Medication Sig    estrogen, conjugated, (PREMARIN) 0.625 MG/GM vaginal cream Place 0.5 g vaginally in the morning.    naproxen (EC-NAPROXEN) 500 MG EC tablet TAKE 1 TABLET BY MOUTH IN THE MORNING AND IN THE EVENING WITH MEALS    cetirizine (ZYRTEC) 10 MG tablet Take 1 tablet by mouth in the morning.    buprenorphine-naloxone (SUBOXONE) 8-2 MG sublingual film Take 2.5 films under the tongue daily.    risedronate (ACTONEL) 150 MG tablet Take 1 tablet by mouth every 30 (thirty) days with full glass of  water on empty stomach-nothing else by mouth and do not lie down for 1/2 hour    tiotropium-olodaterol (STIOLTO RESPIMAT) 2.5-2.5 MCG/ACT inhal Inhale 2 puffs into the lungs in the morning.    hydrocortisone 2.5 % cream Apply topically 2 (two) times daily To affected areas of scalp and ears    VITAMIN D3 50 MCG (2000 UT) TABS tablet TAKE 1 TABLET BY MOUTH EVERY DAY IN THE MORNING    atorvastatin (LIPITOR) 80 MG tablet Take 1 tablet by mouth Daily after dinner    polyethylene glycol (GLYCOLAX/MIRALAX) 17 g packet Take 1 packet by mouth in the morning.    docusate sodium (COLACE) 100 MG capsule Take 1 capsule by mouth in the morning and 1 capsule before bedtime.    aspirin 81 MG chewable tablet Take 1 tablet by mouth in the morning.    bisacodyl (DULCOLAX) 5 MG EC tablet Take 8 tablets by mouth See Admin Instructions Take as directed prior to colonoscopy    buPROPion (WELLBUTRIN SR) 200 MG 12 hr tablet Take 1 tablet by mouth in the morning and 1 tablet before  bedtime.    solifenacin (VESICARE) 10 MG tablet Take 1 tablet by mouth in the morning.    albuterol HFA 108 (90 Base) MCG/ACT inhaler INHALE 2 PUFFS INTO THE LUNGS EVERY 6 HOURS AS NEEDED FOR WHEEZING OR SHORTNESS OF BREATH    acetaminophen (TYLENOL) 500 MG tablet Take 500 mg by mouth every 6 (six) hours as needed for Pain    ipratropium-albuterol (DUO-NEB) 0.5-2.5 (3) MG/3ML SOLN Inhalation Solution Take 3 mLs by nebulization 4 (four) times daily.     No current facility-administered medications for this visit.       OBJECTIVE:  BP (!) 147/91   Pulse 86   Temp 97.8 F (36.6 C) (Temporal)   Wt 87.9 kg (193 lb 12.8 oz)   LMP 11/20/1991   BMI 36.12 kg/m   Gen: Well-appearing, speaking normally  HEENT: Head atraumatic, moist mucous membranes, normal conjunctiva  Pulm: No increased work of breathing  Skin: warm and well perfused  Neuro: Alert and oriented, no gross focal motor deficits  Psych: Normal speech and thought process    Please see the beginning of this note for my assessment and plan.     Macario Golds, MD

## 2022-10-28 ENCOUNTER — Ambulatory Visit: Payer: 59 | Admitting: Family Medicine

## 2022-10-28 ENCOUNTER — Telehealth (HOSPITAL_BASED_OUTPATIENT_CLINIC_OR_DEPARTMENT_OTHER): Payer: Self-pay | Admitting: Pharmacist

## 2022-10-28 ENCOUNTER — Telehealth (HOSPITAL_BASED_OUTPATIENT_CLINIC_OR_DEPARTMENT_OTHER): Payer: Self-pay | Admitting: Internal Medicine

## 2022-10-28 DIAGNOSIS — R300 Dysuria: Secondary | ICD-10-CM

## 2022-10-28 DIAGNOSIS — R3915 Urgency of urination: Secondary | ICD-10-CM

## 2022-10-28 DIAGNOSIS — N3281 Overactive bladder: Secondary | ICD-10-CM

## 2022-10-28 DIAGNOSIS — R3911 Hesitancy of micturition: Secondary | ICD-10-CM

## 2022-10-28 DIAGNOSIS — R1032 Left lower quadrant pain: Secondary | ICD-10-CM

## 2022-10-28 MED ORDER — NAPROXEN 500 MG PO TABS
ORAL_TABLET | ORAL | 2 refills | Status: DC
Start: 2022-10-28 — End: 2023-01-14

## 2022-10-28 NOTE — Telephone Encounter (Signed)
Attempted to call patient and daughter both,unable to reach,left message on voicemail to return call.    Please relay the following:    Ms. Glosson does NOT have a urine infection. I'm sorry that she is still in pain! She should drink water and can take ibuprofen or tylenol for the pain.    There were no red blood cells, white blood cells, or any chemicals seen when there is a urine infection.    She had a TINY amount of bacteria, but we all have some bacteria in the urine (bacteria is on our skin and inside our bodies).     She should schedule a visit with her urologist to discuss her continued pain as we discussed.    Macario Golds, MD

## 2022-10-28 NOTE — Telephone Encounter (Signed)
Received call from daughter's encounter.  Called and spoke to patient's daughter.  Reports,patient had an appointment yesterday regarding her abdominal pain and blood in urine and that pt is still experiencing it,wants to know further steps.  Advised of her plan to follow up with [facility],unsure whether she needs another referral.  Requesting refill for the following meds:  Colace  Miralax   Naprosyn  Advised,she has refills at the pharmacy.

## 2022-10-28 NOTE — Telephone Encounter (Signed)
[facility], Cheryl Dixon is a 67 year old female has requested a refill of  Pharmacy is requesting an alternative medication from [facility]-naproxen 500 mg  because it is not covered by patient's insurance. Per Rph ([facility]), recommending Naproxen 500 as next best alternative please review and respond if in line with patient treatment plan   -  NAPROXEN 500 TABLETS       Last Office Visit: 08/20/2022 with PCP  Last Physical Exam: 05/27/2013     There are no preventive care reminders to display for this patient.     Other Med Adult:  Most Recent BP Reading(s)  10/27/22 : (!) 147/91        Cholesterol (mg/dL)   Date Value   16/01/9603 212     LOW DENSITY LIPOPROTEIN DIRECT (mg/dL)   Date Value   54/12/8117 151     HIGH DENSITY LIPOPROTEIN (mg/dL)   Date Value   14/78/2956 47     TRIGLYCERIDES (mg/dL)   Date Value   21/30/8657 119         THYROID SCREEN TSH REFLEX FT4 (uIU/mL)   Date Value   08/20/2022 4.100         No results found for: "TSH"    HEMOGLOBIN A1C (%)   Date Value   08/20/2022 5.6       No results found for: "POCA1C"      INR (no units)   Date Value   12/12/2021 1.1   02/16/2007 1.0 (L)   07/03/2006 < 1.0 (L)       SODIUM (mmol/L)   Date Value   08/20/2022 141       POTASSIUM (mmol/L)   Date Value   08/20/2022 4.9           CREATININE (mg/dL)   Date Value   84/69/6295 0.9        Documented patient preferred pharmacies:    [id number] - [place], [place] - [address]  Phone: [phone] Fax: [phone]

## 2022-10-28 NOTE — Telephone Encounter (Signed)
REFERRAL REQUEST- PROVIDER, PLEASE REVIEW AND SIGN ORDER IF APPROPRIATE      HOW IS REFERRAL BEING REQUESTED: phone    WHO IS REFERRAL BEING REQUESTED BY: Patient    REFERRED TO SPECIALTY: Urology    DIAGNOSIS/CHIEF COMPLAINT: urinary urgency, urinary hesitancy, atrophic vaginitis on topical estrogen therapy, LLQ and left flank pain    HAVE YOU SEEN YOUR PCP FOR THIS ISSUE:     HAVE YOU SEEN YOUR PCP WITHIN THE LAST YEAR:       [facility] ONLY  PROVIDER:  DOS:  NPI:  NUMBER OF VISITS:  LOCATION:  PHONE:

## 2022-10-28 NOTE — Telephone Encounter (Signed)
Contacted the following pharmacy:     [id number] - [place], [place] - [address]  Phone: [phone] Fax: [phone]    Confirmed Naproxen [facility] was cancelled by the pharmacist.

## 2022-10-28 NOTE — Telephone Encounter (Signed)
Please relay the following:    Cheryl Dixon does NOT have a urine infection. I'm sorry that she is still in pain! She should drink water and can take ibuprofen or tylenol for the pain.    There were no red blood cells, white blood cells, or any chemicals seen when there is a urine infection.    She had a TINY amount of bacteria, but we all have some bacteria in the urine (bacteria is on our skin and inside our bodies).     She should schedule a visit with her urologist to discuss her continued pain as we discussed.    Macario Golds, MD

## 2022-10-29 NOTE — Telephone Encounter (Signed)
Called and spoke to patient.  Relayed lab results from provider,voiced understanding.  Urologist appointment: she called [facility] and was told she needs a new referral.  However,patient reports,she would like referral within [facility] rather than going outside if that's possible.  Advised,I will let Dr Moises Blood know.  [facility] number provided.

## 2022-10-31 NOTE — Telephone Encounter (Signed)
Called and advised Internal Urology referral has been placed.[facility] number provided.

## 2022-11-04 ENCOUNTER — Ambulatory Visit: Payer: No Typology Code available for payment source | Attending: Psychosomatic Medicine | Admitting: Clinical

## 2022-11-04 ENCOUNTER — Ambulatory Visit: Payer: No Typology Code available for payment source | Attending: Family Medicine | Admitting: Family Medicine

## 2022-11-04 DIAGNOSIS — F3341 Major depressive disorder, recurrent, in partial remission: Secondary | ICD-10-CM | POA: Insufficient documentation

## 2022-11-04 DIAGNOSIS — F1120 Opioid dependence, uncomplicated: Secondary | ICD-10-CM | POA: Diagnosis present

## 2022-11-04 MED ORDER — BUPRENORPHINE HCL-NALOXONE HCL 8-2 MG SL FILM
ORAL_FILM | SUBLINGUAL | 0 refills | Status: DC
Start: 2022-11-04 — End: 2022-11-18

## 2022-11-04 NOTE — Progress Notes (Signed)
OUTPATIENT PSYCHIATRY GROUP PROGRESS NOTE    For the patient participating in the group via telephone and/or video technology: The patient/guardian has verbally consented to participate in this group therapy session by televisit. The patient/guardian was encouraged to be in a private location due to personal health information being discussed and where they could not be overheard by individuals not participating in the group therapy. Although all [facility] staff/providers participating in this group therapy televisit are in a private location, all the risks of telephone and/or video technology used to conduct this group therapy session cannot be controlled by [place], (i.e. interruptions, unauthorized access/recording and technical difficulties).  Each patient understands that the visit can be stopped at any time for any reason, including if the conferencing connections are inadequate.    Group Name: OBOT: Office Based Opioid Treatment Group    Group Topic Today: PERMA Model of Wellbeing, part 2. Members delved into the [facility] model of well-being, a comprehensive framework developed by Dr. Idelle Jo, one of the founders of positive psychology. The PERMA model outlines five key elements that contribute to human flourishing: Positive Emotions, Engagement, Relationships, Meaning, and Accomplishment. This session aims to provide participants with a thorough understanding of the first three component of the PERMA model - Positive Emotions, Engagement, and Relationships. Members discussed practical strategies for incorporating these elements into daily life to enhance overall well-being.    Leaders: Horatio Pel, MD, Milta Deiters, Ph.D, and Boneta Lucks, RN     Service Type: [phone] Group Psychotherapy     Number of participants in group today:  9         Purpose of Group (choose all that apply):   Skills training/rehab  Support (psychological, family, community resources, recovery)  Insight and behavior  change  Increasing health promoting behaviors  Relapse prevention  Education about Opioid Use Disorder (OUD) and treatment  Enhancing adjustment and coping with OUD  Improving management of OUD and treatment adherence  Enhancing Recovery Process  Decreasing health risk behaviors and risks of relapse  Addressing psychological/psychosocial/behavioral barriers to preventing addiction and relapse          Group Process:    Review of group goals/structure  Education about Opioid Use Disorder (OUD) and related topics  Group discussion and sharing.    Review of strategies to increase healthy coping mechanisms to support recovery and change.  Review of additional relapse prevention strategies.   Discussion of support strategies  Check in        Individual Patient Participation:    Attentive to process  Contributed constructively  Shared personal experience related to subject addressed in group  Reports she had a great 07-07-2024and saw her great grandson who is 2 yo.  Shared that her mom passed away on the 2024/07/07and this celebration with family "was like mom's rebirth".    Medical Necessity of Session (how treatment is necessary to improve symptoms, functioning, or prevent worsening): treatment is necessary to enhance compliance with agonist treatment and maintain abstinence from opioids. Suboxone group participation is mandatory to continue Suboxone prescription and is necessary in order to enhance coping with Opioid Use Disorder.      Relevant Changes in Mental Status: No. Mood seems euthymic; no acute issues reported or noted      Risk Level per Scale:  Suicide: re-assessment not indicated today  Violence: re-assessment not indicated today  Addiction: low     Current risk level represents increase in risk: No.  DIAGNOSES:     Opioid dependence on agonist therapy (HCC)  (primary encounter diagnosis)   SNOMED CT(R): OPIOID DEPENDENCE, ON AGONIST THERAPY     MDD (major depressive disorder), recurrent, in  partial remission (HCC)   SNOMED CT(R): RECURRENT MAJOR DEPRESSION IN PARTIAL REMISSION      REVIEWING TODAY'S VISIT:   Reviewing Today's Visit:  Clinical Interventions Today: Motivational Interviewing; Cognitive Behavioral Therapy (CBT); Relapse Prevention for SUDs  Patient's Response to Interventions: Responsive  Time Spent in Psychotherapy: 60 min            CARE PLAN/ EPISODES:  Linked Episodes   Type: Episode: Status: Noted: Resolved: Last update: Updated by:   PCBHI PCBHI OBOT  Active 09/11/2020  11/04/2022 10:55 AM Milta Deiters, PhD      Comments:       Patient/Guardian:       Strengths/Skills: Help seeking and motivated to get better     Potential Barriers: Multiple life stressors       Patient stated goals: Pt would like to prevent relapses and support her recovery process     Problem 1: Opioid Dependence on Agonist Therapy (Opioid Use Disorder on Agonist Therapy)      Short Term Goals: Pt will use at least one copying skill a day to reduce a risk of relapse, enhance coping with OUD, and support her recovery process     Short Term Target:  60 days  Short TermTarget Date: 12/06/2022  Short Term Goal #1 Progress:       Long Term Goals: Pt will report 50% in urges and cravings reduction and no relapses or close calls       Long Term Target:  90 days  Long TermTarget Date:  01/05/2023   Long Term Goal #1 Progress:         Intervention #1: PCBHI-based OBOT/Group Psychotherapy   Intervention Frequency:  biweekly  Intervention Duration:  90 Days  Intervention Responsibility:  J. Maryann Conners, PhD and the OBOT team                                                                                                                                                                                                                                    Milta Deiters, PhD  11/04/2022  .

## 2022-11-04 NOTE — Progress Notes (Signed)
Cheryl Dixon is a 67 year old female seen in follow up for opioid use disorder:    Buprenorphine dose: 2.5  tabs daily (x8mg )  Response, adequacy of dose: good  Relapses/close calls: No  Meetings: attends every 4+ week  Non-prescribed substance use: None reported      Social history/events:  Her mother passed away on 07/10/24so she celebrates her every year.    Additional psychiatric or medical care/events:No    Present Medications:  naproxen (NAPROSYN) 500 MG tablet, TAKE 1 TABLET BY MOUTH IN THE MORNING AND IN THE EVENING WITH MEALS, Disp: 60 tablet, Rfl: 2  estrogen, conjugated, (PREMARIN) 0.625 MG/GM vaginal cream, Place 0.5 g vaginally in the morning., Disp: 12 g, Rfl: 0  cetirizine (ZYRTEC) 10 MG tablet, Take 1 tablet by mouth in the morning., Disp: 90 tablet, Rfl: 0  buprenorphine-naloxone (SUBOXONE) 8-2 MG sublingual film, Take 2.5 films under the tongue daily., Disp: 70 Film, Rfl: 0  risedronate (ACTONEL) 150 MG tablet, Take 1 tablet by mouth every 30 (thirty) days with full glass of water on empty stomach-nothing else by mouth and do not lie down for 1/2 hour, Disp: 3 tablet, Rfl: 3  tiotropium-olodaterol (STIOLTO RESPIMAT) 2.5-2.5 MCG/ACT inhal, Inhale 2 puffs into the lungs in the morning., Disp: 3 each, Rfl: 3  hydrocortisone 2.5 % cream, Apply topically 2 (two) times daily To affected areas of scalp and ears, Disp: 60 g, Rfl: 1  VITAMIN D3 50 MCG (2000 UT) TABS tablet, TAKE 1 TABLET BY MOUTH EVERY DAY IN THE MORNING, Disp: 90 tablet, Rfl: 3  atorvastatin (LIPITOR) 80 MG tablet, Take 1 tablet by mouth Daily after dinner, Disp: 90 tablet, Rfl: 3  polyethylene glycol (GLYCOLAX/MIRALAX) 17 g packet, Take 1 packet by mouth in the morning., Disp: 90 packet, Rfl: 3  docusate sodium (COLACE) 100 MG capsule, Take 1 capsule by mouth in the morning and 1 capsule before bedtime., Disp: 180 capsule, Rfl: 3  aspirin 81 MG chewable tablet, Take 1 tablet by mouth in the morning., Disp: 90 tablet, Rfl: 3  bisacodyl  (DULCOLAX) 5 MG EC tablet, Take 8 tablets by mouth See Admin Instructions Take as directed prior to colonoscopy, Disp: 8 tablet, Rfl: 0  buPROPion (WELLBUTRIN SR) 200 MG 12 hr tablet, Take 1 tablet by mouth in the morning and 1 tablet before bedtime., Disp: 180 tablet, Rfl: 3  solifenacin (VESICARE) 10 MG tablet, Take 1 tablet by mouth in the morning., Disp: , Rfl:   albuterol HFA 108 (90 Base) MCG/ACT inhaler, INHALE 2 PUFFS INTO THE LUNGS EVERY 6 HOURS AS NEEDED FOR WHEEZING OR SHORTNESS OF BREATH, Disp: 8.5 g, Rfl: 11  acetaminophen (TYLENOL) 500 MG tablet, Take 500 mg by mouth every 6 (six) hours as needed for Pain, Disp: , Rfl:   ipratropium-albuterol (DUO-NEB) 0.5-2.5 (3) MG/3ML SOLN Inhalation Solution, Take 3 mLs by nebulization 4 (four) times daily., Disp: 180 mL, Rfl: 0    No current facility-administered medications on file prior to visit.      Last UDS:  LAST UDS   AMPHETAMINES URINE (ng/mL)   Date Value   10/27/2022 NEGATIVE     COCAINE METABOLITES URINE (ng/mL)   Date Value   10/27/2022 NEGATIVE     OPIATES URINE (ng/mL)   Date Value   10/27/2022 NEGATIVE     BENZODIAZEPINES URINE (ng/mL)   Date Value   10/27/2022 NEGATIVE     CANNABINOIDS URINE (ng/mL)   Date Value   10/27/2022 POSITIVE (A)  ETHANOL URINE (mg/dL)   Date Value   37/62/8315 NEGATIVE       Review of Systems: no notable symptoms    PHYSICAL EXAMINATION: by video  General appearance - healthy female in no distress  Eyes - pupils normal  Skin - warm and dry  Neuro - nonfocal  Affect - normal    Problem List Items Addressed This Visit    None       Today in group we discussed living a joyful life:  Cheryl Dixon contributed her experiences.    Assessment:  Opioid use disorder  Comment: the patient actively participated and shared experiences, interacting appropriately with group without barriers to understanding  Plan:  continue current regimen with scheduled follow up.  Patient is counseled regarding relapse prevention, involvement in  recovery groups, and potential side effects of buprenorphine.    Horatio Pel, MD

## 2022-11-11 ENCOUNTER — Ambulatory Visit: Payer: 59 | Admitting: Family Medicine

## 2022-11-12 ENCOUNTER — Ambulatory Visit (HOSPITAL_BASED_OUTPATIENT_CLINIC_OR_DEPARTMENT_OTHER): Payer: Self-pay

## 2022-11-12 NOTE — Progress Notes (Signed)
67 year old female presents for Patient presents with:  Fever  Other: Frequent urinating x 2 years    ASSESSMENT AND PLAN:    Urinary tract infection without hematuria, site unspecified  Overactive bladder  X2 days of urinary frequency, urgency, frequency. She also admits to fever yesterday, taking APAP today for symptoms. Hx of chronic dysuria which has been frustrating.     On vesicare 10 mg daily for OAB - she is seen by [facility]. She notes she felt medication not working after switch to generic. Requests refills of trade name med today.     PLAN:  POC UA with w/ nitrites.  Will treat for UTI with macrobid 100 mg BID x5 days  Discussed dosing and possible Ses (n/v, HA, dizziness) of medication.   Encouraged increased hydration.   Warning s/sxs reviewed (fever, flank pain, blood in urine).   Take entire course of ABX even if sxs resolve earlier.   RTC if sxs worsen or do not improve in 5-7 days.     Reviewed she needs to follow up with Urology - refills of vesicare sent to pharmacy enough to cover until visit with urology.     - nitrofurantoin (MACROBID) 100 MG capsule; Take 1 capsule by mouth in the morning and 1 capsule before bedtime. Do all this for 5 days.  Dispense: 10 capsule; Refill: 0    - solifenacin (VESICARE) 10 MG tablet; Take 1 tablet by mouth in the morning.  Dispense: 90 tablet; Refill: 0    Postmenopausal atrophic vaginitis  Refilled premarin. Was sent at last visit but notes she needs refills.     - estrogen, conjugated, (PREMARIN) 0.625 MG/GM vaginal cream; Place 0.5 g vaginally in the morning.  Dispense: 30 g; Refill: 3    Anxiety state  Admits to increased anxiety, mostly about health. Notes chronic dysuria worrisome to her. She lives alone - daughter is support system. On wellbutrin 200 mg daily.     PLAN:  Given increased anxiety - will add on Buspar 5 mg to be taken BID.   Reviewed dosing and possible side effects.   Recommended close follow up with in 1 month to re-evaluate anxiety  symptoms.   Accepts referral to therapy today - advised on wait times.   Advised to return to clinic with new or worsening symptoms for re-evaluation.    - RFL TO ADULT OUTPT PSYCH (NEW) [facility]  - busPIRone (BUSPAR) 5 MG tablet; Take 1 tablet by mouth in the morning and 1 tablet before bedtime.  Dispense: 60 tablet; Refill: 0      Epigastric pain  At end of visit notes she has heart burn, takes tums with relief but would like something else for symptoms.     PLAN:  Trial of famotidine 20 mg BID.   Reviewed dosing and possible SE.    - famotidine (PEPCID) 20 MG tablet; Take 1 tablet by mouth in the morning and 1 tablet before bedtime.  Dispense: 60 tablet; Refill: 2    SUBJECTIVE:  67 year old female presents for Patient presents with:  Fever  Other: Frequent urinating x 2 years  PCP Roll, David, MD.     #urinary symptoms:   Chronic frequency   Feels like she has to go frequently then will have cramping   Was on vesicare x year for 1 year - felt was working   Then noticed medication was change to generic - felt stopped working   Stopped taking vesicare -  after 1 week  Currently not taking it   Felt like couldn't urinate but felt like she needed too   Also made dizzy   With generic   This was prescribed by [facility].  Has appointment on 01/29/2023 at [facility].     Admits to dysuria, frequency, urgency x 2 days   Took APAP today because of pain  Has fever yesterday  Denies hematuria  Denies flank pain     #atrophic vaginitis:   Started on generic topical estrogen for atrophic vaginitis   Needs refill     #anxiety:   On wellbutrin 200 mg BID  Has been on for while   Feels her anxiety has been severe  Has been going for a few weeks  Not sleeping well - wakes up  Admits to waking up night   Notes that she has to call ambulance a few times a month   Fells nevous about her health   Has most anxiety at night   Also has anxiety thinking about not being able to sleep   Living at home alone   Feels safe at home  No therapy -  would like referral   Close to daughter - support   Feels like can speak to her about anything   Was using edibles for help with sleep - too expensive.    Has trailed trazodone, doxepin     #GERD:  Would like to something for heartburn       Patient Active Problem List:     Opioid dependence on agonist therapy (HCC)     Tobacco use disorder     Fibrocystic breast     Essential hypertension     Knee pain     Chronic low back pain     OA (osteoarthritis) of knee     H. pylori infection     Major depressive disorder, recurrent episode, severe (HCC)     Occult blood in stools     Prediabetes     Epigastric pain     Chronic obstructive pulmonary disease (HCC)     Left foot pain     Multiple polyps of colon     Acquired deformity of toe, right     Dislocation of metatarsophalangeal joint of lesser toe, right, sequela     Postmenopausal atrophic vaginitis     Abnormal loss of weight     Aneurysm of ascending aorta without rupture (HCC)     COVID-19 virus infection     BMI 32.0-32.9,adult     Chronic dysuria     LLQ pain     Osteoporosis     History of endometriosis     Lung cancer screening     Constipation, drug induced     Weakness     Cerebrovascular accident (CVA) (HCC)     Overactive bladder      [facility]) [phone] Option #4        REVIEW OF SYSTEMS: negative or noted above    Current Outpatient Medications   Medication Sig    nitrofurantoin (MACROBID) 100 MG capsule Take 1 capsule by mouth in the morning and 1 capsule before bedtime. Do all this for 5 days.    solifenacin (VESICARE) 10 MG tablet Take 1 tablet by mouth in the morning.    estrogen, conjugated, (PREMARIN) 0.625 MG/GM vaginal cream Place 0.5 g vaginally in the morning.    busPIRone (BUSPAR) 5 MG tablet Take 1 tablet by mouth in  the morning and 1 tablet before bedtime.    famotidine (PEPCID) 20 MG tablet Take 1 tablet by mouth in the morning and 1 tablet before bedtime.    buprenorphine-naloxone (SUBOXONE) 8-2 MG sublingual  film Take 2.5 films under the tongue daily.    naproxen (NAPROSYN) 500 MG tablet TAKE 1 TABLET BY MOUTH IN THE MORNING AND IN THE EVENING WITH MEALS    cetirizine (ZYRTEC) 10 MG tablet Take 1 tablet by mouth in the morning.    risedronate (ACTONEL) 150 MG tablet Take 1 tablet by mouth every 30 (thirty) days with full glass of water on empty stomach-nothing else by mouth and do not lie down for 1/2 hour    tiotropium-olodaterol (STIOLTO RESPIMAT) 2.5-2.5 MCG/ACT inhal Inhale 2 puffs into the lungs in the morning.    hydrocortisone 2.5 % cream Apply topically 2 (two) times daily To affected areas of scalp and ears    VITAMIN D3 50 MCG (2000 UT) TABS tablet TAKE 1 TABLET BY MOUTH EVERY DAY IN THE MORNING    atorvastatin (LIPITOR) 80 MG tablet Take 1 tablet by mouth Daily after dinner    polyethylene glycol (GLYCOLAX/MIRALAX) 17 g packet Take 1 packet by mouth in the morning.    docusate sodium (COLACE) 100 MG capsule Take 1 capsule by mouth in the morning and 1 capsule before bedtime.    aspirin 81 MG chewable tablet Take 1 tablet by mouth in the morning.    bisacodyl (DULCOLAX) 5 MG EC tablet Take 8 tablets by mouth See Admin Instructions Take as directed prior to colonoscopy    buPROPion (WELLBUTRIN SR) 200 MG 12 hr tablet Take 1 tablet by mouth in the morning and 1 tablet before bedtime.    albuterol HFA 108 (90 Base) MCG/ACT inhaler INHALE 2 PUFFS INTO THE LUNGS EVERY 6 HOURS AS NEEDED FOR WHEEZING OR SHORTNESS OF BREATH    acetaminophen (TYLENOL) 500 MG tablet Take 500 mg by mouth every 6 (six) hours as needed for Pain    ipratropium-albuterol (DUO-NEB) 0.5-2.5 (3) MG/3ML SOLN Inhalation Solution Take 3 mLs by nebulization 4 (four) times daily.     No current facility-administered medications for this visit.     OBJECTIVE:  BP (!) 131/93   Pulse 81   Temp 97.9 F (36.6 C) (Temporal)   Wt 88.5 kg (195 lb)   LMP 11/20/1991   SpO2 95%   BMI 36.35 kg/m     Gen: Well-appearing, speaking normally  HEENT: Head  atraumatic, moist mucous membranes, normal conjunctiva  CV: regular rate and rhythm, no murmurs/rubs/gallops, no pitting edema of lower extremities  Pulm: No increased work of breathing, lungs clear to auscultation bilaterally, no wheezes/rales/rhonchi  Abd: normoactive bowel sounds, non-distended, no tenderness to palpation, no CVAT  Skin: warm and well perfused  Neuro: Alert and oriented, no gross focal motor deficits  Psych: Normal speech and thought process    All of the patients questions were answered and they expressed verbal agreement and understanding with all of the above. They were aware that they can call or make an appointment for additional concerns.     We discussed the patient's current medications including proper use and potential side effects. The patient expressed understanding and no barriers to adherence were identified.     1. The patient indicates understanding of these issues and agrees with the plan. Brief care plan is updated and reviewed with the patient.   2. The patient is given an After Visit Summary sheet  that lists all medications with directions, allergies, orders placed during this encounter, and follow-up instructions.   3. I reviewed the patient's medical information and medical history   4. I reconciled the patient's medication list and prepared and supplied needed refills.   5. I have reviewed the past medical, family, and social history sections including the medications and allergies.    I spent a total of 38 minutes on this visit on the date of service (total time includes all activities performed on the date of service)    Maricela Bo, PA-C, 11/13/2022

## 2022-11-12 NOTE — Telephone Encounter (Signed)
Pt calling  She reports she has frequent urination - worried that it is due to her new medication  Low grade fever  She repots her estrogen - no longer getting the brand name medication - getting generic   She is worried that the generic medications are not as helpful - feels her symptoms have returned   She reports she was not given a large enough supply of the estrogen - takes 0.5 a day- given a 30 gram tube. Should last 60 days- pt reports this is not the case      Reason for Disposition  . Patient wants to be seen  . Urinating more frequently than usual (i.e., frequency)    Answer Assessment - Initial Assessment Questions  See triage note    Protocols used: Urinary Symptoms-A-OH

## 2022-11-12 NOTE — PostOp Call (Signed)
Advised per nursing triage protocol.  Verbalized understanding and agreement with instructions and disposition.     Recommended disposition for patient:Disposition: See in Office within 3 days    If patient referred to UC/ED advised that they may require further follow up and testing after the visit with their primary care office.     Instructed patient to call back for any new, worsening, or worrisome symptoms or concerns any time day or night.

## 2022-11-13 ENCOUNTER — Ambulatory Visit: Payer: No Typology Code available for payment source | Attending: Internal Medicine | Admitting: Family Medicine

## 2022-11-13 ENCOUNTER — Other Ambulatory Visit: Payer: Self-pay

## 2022-11-13 ENCOUNTER — Encounter (HOSPITAL_BASED_OUTPATIENT_CLINIC_OR_DEPARTMENT_OTHER): Payer: Self-pay | Admitting: Family Medicine

## 2022-11-13 VITALS — BP 131/93 | HR 81 | Temp 97.9°F | Wt 195.0 lb

## 2022-11-13 DIAGNOSIS — F411 Generalized anxiety disorder: Secondary | ICD-10-CM | POA: Insufficient documentation

## 2022-11-13 DIAGNOSIS — F332 Major depressive disorder, recurrent severe without psychotic features: Secondary | ICD-10-CM | POA: Diagnosis present

## 2022-11-13 DIAGNOSIS — R1013 Epigastric pain: Secondary | ICD-10-CM | POA: Insufficient documentation

## 2022-11-13 DIAGNOSIS — N3281 Overactive bladder: Secondary | ICD-10-CM | POA: Insufficient documentation

## 2022-11-13 DIAGNOSIS — N952 Postmenopausal atrophic vaginitis: Secondary | ICD-10-CM | POA: Diagnosis not present

## 2022-11-13 DIAGNOSIS — N390 Urinary tract infection, site not specified: Secondary | ICD-10-CM | POA: Diagnosis not present

## 2022-11-13 LAB — POC URINALYSIS
BILIRUBIN, URINE: NEGATIVE
GLUCOSE,URINE: 100 — AB
KETONE, URINE: 15 — AB
LEUKOCYTE ESTERASE: NEGATIVE
NITRITE, URINE: POSITIVE — AB
PH URINE: 6.5 (ref 5.0–8.0)
PROTEIN, URINE: NEGATIVE
SPECIFIC GRAVITY, URINE: 1.015 (ref 1.003–1.030)
UROBILINOGEN URINE: 1 — AB (ref 0.2–1.0)

## 2022-11-13 MED ORDER — ESTROGENS CONJUGATED 0.625 MG/GM VA CREA
0.5000 g | TOPICAL_CREAM | Freq: Every day | VAGINAL | 3 refills | Status: DC
Start: 2022-11-13 — End: 2022-12-08

## 2022-11-13 MED ORDER — BUSPIRONE HCL 5 MG PO TABS
5.0000 mg | ORAL_TABLET | Freq: Two times a day (BID) | ORAL | 0 refills | Status: DC
Start: 2022-11-13 — End: 2022-12-05

## 2022-11-13 MED ORDER — FAMOTIDINE 20 MG PO TABS
20.0000 mg | ORAL_TABLET | Freq: Two times a day (BID) | ORAL | 2 refills | Status: DC
Start: 2022-11-13 — End: 2023-02-10

## 2022-11-13 MED ORDER — SOLIFENACIN SUCCINATE 10 MG PO TABS
10.0000 mg | ORAL_TABLET | Freq: Every day | ORAL | 0 refills | Status: DC
Start: 2022-11-13 — End: 2023-01-15

## 2022-11-13 MED ORDER — NITROFURANTOIN MONOHYD MACRO 100 MG PO CAPS
100.0000 mg | ORAL_CAPSULE | Freq: Two times a day (BID) | ORAL | 0 refills | Status: AC
Start: 2022-11-13 — End: 2022-11-18

## 2022-11-18 ENCOUNTER — Ambulatory Visit: Payer: No Typology Code available for payment source | Attending: Psychosomatic Medicine | Admitting: Clinical

## 2022-11-18 ENCOUNTER — Ambulatory Visit: Payer: No Typology Code available for payment source | Admitting: Family Medicine

## 2022-11-18 DIAGNOSIS — F1120 Opioid dependence, uncomplicated: Secondary | ICD-10-CM

## 2022-11-18 DIAGNOSIS — F3341 Major depressive disorder, recurrent, in partial remission: Secondary | ICD-10-CM | POA: Insufficient documentation

## 2022-11-18 MED ORDER — BUPRENORPHINE HCL-NALOXONE HCL 8-2 MG SL FILM
ORAL_FILM | SUBLINGUAL | 0 refills | Status: DC
Start: 2022-11-18 — End: 2022-12-02

## 2022-11-18 NOTE — Progress Notes (Signed)
Cheryl Dixon is a 67 year old female seen in follow up for opioid use disorder:    Buprenorphine dose: 2.5  tabs daily (x8mg )  Response, adequacy of dose: good  Relapses/close calls: No  Meetings: attends every 4+ week  Non-prescribed substance use: None reported      Social history/events:  none  Additional psychiatric or medical care/events:No    Present Medications:  nitrofurantoin (MACROBID) 100 MG capsule, Take 1 capsule by mouth in the morning and 1 capsule before bedtime. Do all this for 5 days., Disp: 10 capsule, Rfl: 0  solifenacin (VESICARE) 10 MG tablet, Take 1 tablet by mouth in the morning., Disp: 90 tablet, Rfl: 0  estrogen, conjugated, (PREMARIN) 0.625 MG/GM vaginal cream, Place 0.5 g vaginally in the morning., Disp: 30 g, Rfl: 3  busPIRone (BUSPAR) 5 MG tablet, Take 1 tablet by mouth in the morning and 1 tablet before bedtime., Disp: 60 tablet, Rfl: 0  famotidine (PEPCID) 20 MG tablet, Take 1 tablet by mouth in the morning and 1 tablet before bedtime., Disp: 60 tablet, Rfl: 2  buprenorphine-naloxone (SUBOXONE) 8-2 MG sublingual film, Take 2.5 films under the tongue daily., Disp: 70 Film, Rfl: 0  naproxen (NAPROSYN) 500 MG tablet, TAKE 1 TABLET BY MOUTH IN THE MORNING AND IN THE EVENING WITH MEALS, Disp: 60 tablet, Rfl: 2  cetirizine (ZYRTEC) 10 MG tablet, Take 1 tablet by mouth in the morning., Disp: 90 tablet, Rfl: 0  risedronate (ACTONEL) 150 MG tablet, Take 1 tablet by mouth every 30 (thirty) days with full glass of water on empty stomach-nothing else by mouth and do not lie down for 1/2 hour, Disp: 3 tablet, Rfl: 3  tiotropium-olodaterol (STIOLTO RESPIMAT) 2.5-2.5 MCG/ACT inhal, Inhale 2 puffs into the lungs in the morning., Disp: 3 each, Rfl: 3  hydrocortisone 2.5 % cream, Apply topically 2 (two) times daily To affected areas of scalp and ears, Disp: 60 g, Rfl: 1  VITAMIN D3 50 MCG (2000 UT) TABS tablet, TAKE 1 TABLET BY MOUTH EVERY DAY IN THE MORNING, Disp: 90 tablet, Rfl: 3  atorvastatin  (LIPITOR) 80 MG tablet, Take 1 tablet by mouth Daily after dinner, Disp: 90 tablet, Rfl: 3  polyethylene glycol (GLYCOLAX/MIRALAX) 17 g packet, Take 1 packet by mouth in the morning., Disp: 90 packet, Rfl: 3  docusate sodium (COLACE) 100 MG capsule, Take 1 capsule by mouth in the morning and 1 capsule before bedtime., Disp: 180 capsule, Rfl: 3  aspirin 81 MG chewable tablet, Take 1 tablet by mouth in the morning., Disp: 90 tablet, Rfl: 3  bisacodyl (DULCOLAX) 5 MG EC tablet, Take 8 tablets by mouth See Admin Instructions Take as directed prior to colonoscopy, Disp: 8 tablet, Rfl: 0  buPROPion (WELLBUTRIN SR) 200 MG 12 hr tablet, Take 1 tablet by mouth in the morning and 1 tablet before bedtime., Disp: 180 tablet, Rfl: 3  albuterol HFA 108 (90 Base) MCG/ACT inhaler, INHALE 2 PUFFS INTO THE LUNGS EVERY 6 HOURS AS NEEDED FOR WHEEZING OR SHORTNESS OF BREATH, Disp: 8.5 g, Rfl: 11  acetaminophen (TYLENOL) 500 MG tablet, Take 500 mg by mouth every 6 (six) hours as needed for Pain, Disp: , Rfl:   ipratropium-albuterol (DUO-NEB) 0.5-2.5 (3) MG/3ML SOLN Inhalation Solution, Take 3 mLs by nebulization 4 (four) times daily., Disp: 180 mL, Rfl: 0    No current facility-administered medications on file prior to visit.      Last UDS:  LAST UDS   AMPHETAMINES URINE (ng/mL)   Date Value  10/27/2022 NEGATIVE     COCAINE METABOLITES URINE (ng/mL)   Date Value   10/27/2022 NEGATIVE     OPIATES URINE (ng/mL)   Date Value   10/27/2022 NEGATIVE     BENZODIAZEPINES URINE (ng/mL)   Date Value   10/27/2022 NEGATIVE     CANNABINOIDS URINE (ng/mL)   Date Value   10/27/2022 POSITIVE (A)     ETHANOL URINE (mg/dL)   Date Value   16/01/9603 NEGATIVE       Review of Systems: no notable symptoms    PHYSICAL EXAMINATION: by video  General appearance - healthy female in no distress  Eyes - pupils normal  Skin - warm and dry  Neuro - nonfocal  Affect - normal    Problem List Items Addressed This Visit    None       Today in group we discussed time  management:  [name] contributed to the group    Assessment:  Opioid use disorder  Comment: the patient actively participated and shared experiences, interacting appropriately with group without barriers to understanding  Plan:  continue current regimen with scheduled follow up.  Patient is counseled regarding relapse prevention, involvement in recovery groups, and potential side effects of buprenorphine.    Horatio Pel, MD

## 2022-11-19 ENCOUNTER — Ambulatory Visit
Admission: RE | Admit: 2022-11-19 | Discharge: 2022-11-19 | Disposition: A | Discharge status: Home / Self Care | Payer: No Typology Code available for payment source | Attending: Diagnostic Radiology | Admitting: Diagnostic Radiology

## 2022-11-19 ENCOUNTER — Other Ambulatory Visit: Payer: Self-pay

## 2022-11-19 DIAGNOSIS — Z122 Encounter for screening for malignant neoplasm of respiratory organs: Secondary | ICD-10-CM | POA: Diagnosis not present

## 2022-11-19 DIAGNOSIS — I7121 Aneurysm of the ascending aorta, without rupture: Secondary | ICD-10-CM | POA: Diagnosis not present

## 2022-11-19 DIAGNOSIS — Z87891 Personal history of nicotine dependence: Secondary | ICD-10-CM | POA: Insufficient documentation

## 2022-11-19 NOTE — Progress Notes (Signed)
OUTPATIENT PSYCHIATRY GROUP PROGRESS NOTE    For the patient participating in the group via telephone and/or video technology: The patient/guardian has verbally consented to participate in this group therapy session by televisit. The patient/guardian was encouraged to be in a private location due to personal health information being discussed and where they could not be overheard by individuals not participating in the group therapy. Although all [place] staff/providers participating in this group therapy televisit are in a private location, all the risks of telephone and/or video technology used to conduct this group therapy session cannot be controlled by [place], (i.e. interruptions, unauthorized access/recording and technical difficulties).  Each patient understands that the visit can be stopped at any time for any reason, including if the conferencing connections are inadequate.    Group Name: OBOT: Office Based Opioid Treatment Group    Group Topic Today: Time Management Skills and Overcoming Procrastination. This group meeting was focused on time management skills and overcoming procrastination. Through a combination of guidance, peer discussion, and hands-on activities, members learned about practical strategies to boost productivity and reduce stress, including  the "2-Minute Rule", [employer], Time Blocking, and the Pomodoro Technique. Members reviewed each technique, discussed their procrastination habits and brainstormed solutions.    Leaders: Horatio Pel, MD and Milta Deiters, Ph.D.      Service Type: [phone] Group Psychotherapy     Number of participants in group today:  10         Purpose of Group (choose all that apply):   Skills training/rehab  Support (psychological, family, community resources, recovery)  Insight and behavior change  Increasing health promoting behaviors  Relapse prevention  Education about Opioid Use Disorder (OUD) and treatment  Enhancing adjustment and coping with  OUD  Improving management of OUD and treatment adherence  Enhancing Recovery Process  Decreasing health risk behaviors and risks of relapse  Addressing psychological/psychosocial/behavioral barriers to preventing addiction and relapse          Group Process:    Review of group goals/structure  Education about Opioid Use Disorder (OUD) and related topics  Group discussion and sharing.    Review of strategies to increase healthy coping mechanisms to support recovery and change.  Review of additional relapse prevention strategies.   Discussion of support strategies  Check in       Individual Patient Participation:    Minimal participation  Seemed distracted  Came for most of group but left before checking in at the end    .[email or web address]    DIAGNOSES:     Opioid dependence on agonist therapy (HCC)  (primary encounter diagnosis)   SNOMED CT(R): OPIOID DEPENDENCE, ON AGONIST THERAPY     MDD (major depressive disorder), recurrent, in partial remission (HCC)   SNOMED CT(R): RECURRENT MAJOR DEPRESSION IN PARTIAL REMISSION      REVIEWING TODAY'S VISIT:   Reviewing Today's Visit:  Clinical Interventions Today: Motivational Interviewing; Cognitive Behavioral Therapy (CBT); Relapse Prevention for SUDs  Patient's Response to Interventions: Somewhat responsive  Time Spent in Psychotherapy: 60 min            CARE PLAN/ EPISODES:  Linked Episodes   Type: Episode: Status: Noted: Resolved: Last update: Updated by:   PCBHI PCB[facility] OBOT  Active 09/11/2020  11/18/2022 11:02 AM Milta Deiters, PhD      Comments:       Patient/Guardian:       Strengths/Skills: Help seeking and motivated to get better     Potential Barriers: Multiple life stressors  Patient stated goals: Pt would like to prevent relapses and support her recovery process     Problem 1: Opioid Dependence on Agonist Therapy (Opioid Use Disorder on Agonist Therapy)      Short Term Goals: Pt will use at least one copying skill a day to reduce a risk of relapse, enhance coping with  OUD, and support her recovery process     Short Term Target:  60 days  Short TermTarget Date: 12/06/2022  Short Term Goal #1 Progress:       Long Term Goals: Pt will report 50% in urges and cravings reduction and no relapses or close calls       Long Term Target:  90 days  Long TermTarget Date:  01/05/2023   Long Term Goal #1 Progress:         Intervention #1: PCBHI-based OBOT/Group Psychotherapy   Intervention Frequency:  biweekly  Intervention Duration:  90 Days  Intervention Responsibility:  J. Maryann Conners, PhD and the OBOT team                                                                                                                                                                                                                                    Milta Deiters, PhD                                  11/18/2022

## 2022-11-20 ENCOUNTER — Ambulatory Visit
Payer: No Typology Code available for payment source | Admitting: Student in an Organized Health Care Education/Training Program

## 2022-11-20 ENCOUNTER — Encounter (HOSPITAL_BASED_OUTPATIENT_CLINIC_OR_DEPARTMENT_OTHER): Payer: Self-pay | Admitting: Student in an Organized Health Care Education/Training Program

## 2022-11-20 DIAGNOSIS — R300 Dysuria: Secondary | ICD-10-CM | POA: Diagnosis not present

## 2022-11-20 DIAGNOSIS — N3281 Overactive bladder: Secondary | ICD-10-CM

## 2022-11-20 DIAGNOSIS — R350 Frequency of micturition: Secondary | ICD-10-CM

## 2022-11-20 NOTE — Assessment & Plan Note (Signed)
67 year old female with hx of dysuria and OAB. Follows with [facility] urology (uses vesicare for this). Recently evaluated 7/18 and found to have UTI --treated with macrobid at that time. Sxs resolved briefly, but returned on Wednesday. Discussed need for repeat urine to determine if new infection. Agrees to OV tomorrow. She is using AZO for symptomatic relief. Return precautions discussed with patient. Patient verbalizes understanding and is in agreement with plan.

## 2022-11-20 NOTE — Progress Notes (Signed)
[facility]  Televisit Note   Subjective:   CC: Cheryl Dixon is a 67 year old female patient who presents for televisit today for urinary sxs    #urinary frequency/OAB  -last discussed during OV 7/18  -was found to have a UTI based on UA results--was started on macrobid at that time  -sxs did resolve for 1-2 days--but then returned on Wednesday    -endorsing malodorous urine, frequency  -she is using Azo for cramping sensation  -also using vesicare 10 mg daily for OAB--follows with [facility] urology  -also using topical estrogen                ROS:  Per HPI   Objective:   No vitals for this encounter due to televisit during covid 19 pandemic     Speaking in full sentences, no distress     Assessment & Plan:   Chronic dysuria  67 year old female with hx of dysuria and OAB. Follows with [facility] urology (uses vesicare for this). Recently evaluated 7/18 and found to have UTI --treated with macrobid at that time. Sxs resolved briefly, but returned on Wednesday. Discussed need for repeat urine to determine if new infection. Agrees to OV tomorrow. She is using AZO for symptomatic relief. Return precautions discussed with patient. Patient verbalizes understanding and is in agreement with plan.        We discussed the patient's current medications. The patient expressed understanding and no barriers to adherence were identified.  The patient was given an AVS  Clista Bernhardt, PA-C 11/20/2022

## 2022-11-21 ENCOUNTER — Encounter (HOSPITAL_BASED_OUTPATIENT_CLINIC_OR_DEPARTMENT_OTHER)
Payer: No Typology Code available for payment source | Admitting: Student in an Organized Health Care Education/Training Program

## 2022-11-21 NOTE — Progress Notes (Deleted)
SUBJECTIVE:    Cheryl Dixon a 67 year old female patient of Roll, Onalee Hua, MD presents for urinary sxs    HPI:  #urinary sxs  -last discussed during TV 7/25 and OV 7/18  -was found to have a UTI based on UA results 7/18--was started on macrobid at that time  -sxs did resolve for 1-2 days--but then returned on Wednesday     -endorsing malodorous urine, frequency  -she is using Azo for cramping sensation  -also using vesicare 10 mg daily for OAB--follows with [facility] urology  -also using topical estrogen    REVIEW OF SYSTEMS: negative or noted above      OBJECTIVE:  LMP 11/20/1991   Gen: Well-appearing, speaking normally  HEENT: Head atraumatic, moist mucous membranes, normal conjunctiva  CV: regular rate and rhythm, no murmurs/rubs/gallops, no pitting edema of lower extremities  Pulm: No increased work of breathing, lungs clear to auscultation, no wheezes/rales/rhonchi  Abd: normoactive bowel sounds, no tenderness to palpation  Skin: warm and well perfused  Neuro: Alert and oriented, no gross focal motor deficits  Psych: Normal speech and thought process    LABS/IMAGING:    ASSESSMENT AND PLAN:  Problem List Items Addressed This Visit    None      Return precautions discussed with patient. Patient verbalizes understanding and is in agreement with plan.      We discussed the patient's current medications. The patient expressed understanding and no barriers to adherence were identified.   1. The patient indicates understanding of these issues and agrees with the plan. Brief care plan is updated and reviewed with the patient.   2. The patient is given an After Visit Summary sheet that lists all medications with directions, allergies, orders placed during this encounter, and follow-up instructions.   3. I reviewed the patient's medical information and medical history   4. I reconciled the patient's medication list and prepared and supplied needed refills.   5. I have reviewed the past medical, family, and social history  sections including the medications and allergies.  Clista Bernhardt, PA-C

## 2022-11-24 ENCOUNTER — Ambulatory Visit (HOSPITAL_BASED_OUTPATIENT_CLINIC_OR_DEPARTMENT_OTHER): Payer: No Typology Code available for payment source | Admitting: Internal Medicine

## 2022-11-25 ENCOUNTER — Ambulatory Visit: Payer: 59 | Admitting: Family Medicine

## 2022-11-25 ENCOUNTER — Other Ambulatory Visit (HOSPITAL_BASED_OUTPATIENT_CLINIC_OR_DEPARTMENT_OTHER): Payer: Self-pay | Admitting: Internal Medicine

## 2022-11-25 MED ORDER — PREMARIN 0.625 MG/GM VA CREA
0.5000 g | TOPICAL_CREAM | Freq: Every day | VAGINAL | 0 refills | Status: DC
Start: 2022-11-25 — End: 2022-12-08

## 2022-11-25 NOTE — Telephone Encounter (Signed)
Patient is requesting brand name only premarin, and she would like this sent to [facility].     I have pended a new order, please submit a new RX if appropriate.    Thank you !

## 2022-12-02 ENCOUNTER — Other Ambulatory Visit (HOSPITAL_BASED_OUTPATIENT_CLINIC_OR_DEPARTMENT_OTHER): Payer: Self-pay | Admitting: Family Medicine

## 2022-12-02 ENCOUNTER — Ambulatory Visit: Payer: 59 | Admitting: Family Medicine

## 2022-12-02 DIAGNOSIS — F1120 Opioid dependence, uncomplicated: Secondary | ICD-10-CM

## 2022-12-02 MED ORDER — BUPRENORPHINE HCL-NALOXONE HCL 8-2 MG SL FILM
ORAL_FILM | SUBLINGUAL | 0 refills | Status: DC
Start: 2022-12-02 — End: 2022-12-30

## 2022-12-02 MED ORDER — BUPRENORPHINE HCL-NALOXONE HCL 8-2 MG SL FILM
ORAL_FILM | SUBLINGUAL | 0 refills | Status: DC
Start: 2022-12-02 — End: 2022-12-02

## 2022-12-02 NOTE — Telephone Encounter (Signed)
[facility]ed [facility] and [facility] with [facility].    Rx for Suboxone was transmission to pharmacy failed.     They are not having issues with their system but stated the DEA number being sent by Horatio Pel , MD is not working for some reason    Please resend RX

## 2022-12-02 NOTE — Telephone Encounter (Signed)
suboxone - Transmission to pharmacy failed. Please resend    Documented patient preferred pharmacies:    [id number] - [place], [place] - [address]  Phone: [phone] Fax: [phone]

## 2022-12-02 NOTE — Addendum Note (Signed)
Addended by: Cece Milhouse on: 12/02/2022 11:54 AM     Modules accepted: Orders

## 2022-12-02 NOTE — Addendum Note (Signed)
Addended by: Jari Sportsman D. on: 12/02/2022 11:15 AM     Modules accepted: Orders

## 2022-12-04 ENCOUNTER — Encounter (HOSPITAL_BASED_OUTPATIENT_CLINIC_OR_DEPARTMENT_OTHER): Payer: No Typology Code available for payment source

## 2022-12-05 ENCOUNTER — Other Ambulatory Visit (HOSPITAL_BASED_OUTPATIENT_CLINIC_OR_DEPARTMENT_OTHER): Payer: Self-pay | Admitting: Family Medicine

## 2022-12-05 DIAGNOSIS — F411 Generalized anxiety disorder: Secondary | ICD-10-CM

## 2022-12-05 NOTE — Telephone Encounter (Signed)
PER Patient (self), Cheryl Dixon is a 67 year old female has requested a refill of      -  Buspar       Last Office Visit: 11/20/22 with Love,N     There are no preventive care reminders to display for this patient.     Other Med Adult:  Most Recent BP Reading(s)  11/13/22 : (!) 131/93        Cholesterol (mg/dL)   Date Value   71/69/6789 212     LOW DENSITY LIPOPROTEIN DIRECT (mg/dL)   Date Value   38/01/1750 151     HIGH DENSITY LIPOPROTEIN (mg/dL)   Date Value   02/58/5277 47     TRIGLYCERIDES (mg/dL)   Date Value   82/42/3536 119         THYROID SCREEN TSH REFLEX FT4 (uIU/mL)   Date Value   08/20/2022 4.100         No results found for: "TSH"    HEMOGLOBIN A1C (%)   Date Value   08/20/2022 5.6       No results found for: "POCA1C"      INR (no units)   Date Value   12/12/2021 1.1   02/16/2007 1.0 (L)   07/03/2006 < 1.0 (L)       SODIUM (mmol/L)   Date Value   08/20/2022 141       POTASSIUM (mmol/L)   Date Value   08/20/2022 4.9           CREATININE (mg/dL)   Date Value   14/43/1540 0.9        Documented patient preferred pharmacies:    [id number] - [place], [place] - [address]  Phone: [phone] Fax: [phone]

## 2022-12-06 ENCOUNTER — Encounter (HOSPITAL_BASED_OUTPATIENT_CLINIC_OR_DEPARTMENT_OTHER): Payer: Self-pay | Admitting: Internal Medicine

## 2022-12-06 NOTE — Progress Notes (Signed)
Please send letter (do not include details of result):    Your CT of the chest showed no significant nodules, and continued mild dilation of the aorta.  We'll monitor these things again in a year.

## 2022-12-08 ENCOUNTER — Telehealth (HOSPITAL_BASED_OUTPATIENT_CLINIC_OR_DEPARTMENT_OTHER): Payer: Self-pay

## 2022-12-08 ENCOUNTER — Telehealth (HOSPITAL_BASED_OUTPATIENT_CLINIC_OR_DEPARTMENT_OTHER): Payer: Self-pay | Admitting: Registered Nurse

## 2022-12-08 ENCOUNTER — Other Ambulatory Visit: Payer: Self-pay

## 2022-12-08 ENCOUNTER — Encounter (HOSPITAL_BASED_OUTPATIENT_CLINIC_OR_DEPARTMENT_OTHER): Payer: Self-pay

## 2022-12-08 ENCOUNTER — Emergency Department
Admission: EM | Admit: 2022-12-08 | Discharge: 2022-12-09 | Disposition: A | Discharge status: Home / Self Care | Payer: No Typology Code available for payment source | Attending: Emergency Medicine | Admitting: Emergency Medicine

## 2022-12-08 DIAGNOSIS — J069 Acute upper respiratory infection, unspecified: Secondary | ICD-10-CM | POA: Insufficient documentation

## 2022-12-08 DIAGNOSIS — U071 COVID-19: Secondary | ICD-10-CM

## 2022-12-08 DIAGNOSIS — N952 Postmenopausal atrophic vaginitis: Secondary | ICD-10-CM

## 2022-12-08 DIAGNOSIS — R1110 Vomiting, unspecified: Secondary | ICD-10-CM | POA: Diagnosis present

## 2022-12-08 DIAGNOSIS — R059 Cough, unspecified: Secondary | ICD-10-CM | POA: Insufficient documentation

## 2022-12-08 DIAGNOSIS — R519 Headache, unspecified: Secondary | ICD-10-CM | POA: Diagnosis present

## 2022-12-08 DIAGNOSIS — R079 Chest pain, unspecified: Secondary | ICD-10-CM | POA: Diagnosis not present

## 2022-12-08 LAB — URINALYSIS RFLX TO URINE CULT
BILIRUBIN, URINE: NEGATIVE
CASTS: NONE SEEN PER LPF
CRYSTALS: NONE SEEN
GLUCOSE, URINE: NEGATIVE MG/DL
KETONE, URINE: NEGATIVE MG/DL
LEUKOCYTE ESTERASE: NEGATIVE
NITRITE, URINE: NEGATIVE
PH URINE: 6 (ref 5.0–8.0)
PROTEIN, URINE: NEGATIVE MG/DL
SPECIFIC GRAVITY URINE: 1.015 (ref 1.003–1.035)

## 2022-12-08 MED ORDER — ESTRADIOL 0.1 MG/GM VA CREA
TOPICAL_CREAM | VAGINAL | 0 refills | Status: AC
Start: 2022-12-08 — End: 2023-01-07

## 2022-12-08 MED ORDER — ONDANSETRON 4 MG PO TBDP
4.00 mg | ORAL_TABLET | Freq: Once | ORAL | Status: AC
Start: 2022-12-08 — End: 2022-12-08
  Administered 2022-12-08: 4 mg via ORAL
  Filled 2022-12-08: qty 1

## 2022-12-08 MED ORDER — KETOROLAC TROMETHAMINE 30 MG/ML INJ
30.00 mg | Freq: Once | Status: AC
Start: 2022-12-08 — End: 2022-12-08
  Administered 2022-12-08: 30 mg via INTRAMUSCULAR
  Filled 2022-12-08: qty 1
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
## 2022-12-08 NOTE — Telephone Encounter (Signed)
She wants to come I already spoke to a nurse today, has appointment 12/16/22. Follow up appointment  4 years irritated bladder feels like going through withdrawls of estrogen muscles ache and feels tired because switched medications a couple times this month.  Advised calling in the AM for follow up same day appointment.    Emelia Loron, RN, 12/08/2022

## 2022-12-08 NOTE — ED Triage Note (Addendum)
Pt BIBA reports flu like symptoms. Chest congestion and tightness, cough, nausea and vomiting since Friday. Coughing up yellow sputum.  Denies fevers. Pt took mucinex at home.   Hx COPD   Denies CP

## 2022-12-08 NOTE — Telephone Encounter (Signed)
Pt requesting that provider write her estrogen prescription specifically for the brand name Mylan which relieves her symptoms. The other brands do not provider the level of relief as Mylan.   The pharmacy often fills a different brand and she experiences symptoms with other brands.     She is also requesting an order for urine sample for suboxone.

## 2022-12-08 NOTE — Telephone Encounter (Addendum)
Message was already sent to PCP for review.   ----- Message from [place] B sent at 12/08/2022  9:36 AM EDT -----  Regarding: meds  Cheryl Dixon [id number], 67 year old, female    Calls today: Pharmacy keeps changing med to generic brand.   estrogen, conjugated, (PREMARIN) 0.625 MG/GM vaginal cream        Person calling on behalf of patient: Patient (self)    CALL BACK NUMBER: [phone]      Patient's language of care: English    Patient does not need an interpreter.    Patient's PCP: Hildred Laser, MD    Primary Care Home Site:  [facility]

## 2022-12-08 NOTE — Telephone Encounter (Signed)
Changed back to old order for estrogen cream (it had been changed 7/1).  Standing UDS order is already in place.

## 2022-12-08 NOTE — Telephone Encounter (Signed)
Patient identification verified by 2 forms.    Called placed to patient relayed message below.   Patient verbalized understanding and agreed with plan.   Denied any further questions or concerns at this time.

## 2022-12-09 ENCOUNTER — Emergency Department (HOSPITAL_BASED_OUTPATIENT_CLINIC_OR_DEPARTMENT_OTHER): Payer: No Typology Code available for payment source

## 2022-12-09 ENCOUNTER — Ambulatory Visit: Payer: 59 | Admitting: Family Medicine

## 2022-12-09 DIAGNOSIS — R059 Cough, unspecified: Secondary | ICD-10-CM | POA: Diagnosis not present

## 2022-12-09 LAB — RESPIRATORY PANEL BASIC INPAT
INFLUENZA A: NEGATIVE
INFLUENZA B: NEGATIVE
RESPIRATORY SYNCYTIAL VIRUS: NEGATIVE
SARS-COV-2: POSITIVE

## 2022-12-09 LAB — URINE CULTURE WILL NOT BE DONE

## 2022-12-09 NOTE — Narrator Note (Signed)
Patient Disposition  Patient education for diagnosis, medications, activity, diet and follow-up.  Patient left ED 12:30 AM.  Patient rep received written instructions.    Interpreter to provide instructions: No    Patient belongings with patient: YES    Have all existing LDAs been addressed? N/A    Have all IV infusions been stopped? N/A    Destination: Discharged to home

## 2022-12-10 LAB — URINE CULTURE

## 2022-12-10 LAB — EKG

## 2022-12-16 ENCOUNTER — Ambulatory Visit: Payer: No Typology Code available for payment source | Admitting: Clinical

## 2022-12-16 ENCOUNTER — Ambulatory Visit (HOSPITAL_BASED_OUTPATIENT_CLINIC_OR_DEPARTMENT_OTHER): Payer: 59 | Admitting: Registered Nurse

## 2022-12-16 DIAGNOSIS — Z5321 Procedure and treatment not carried out due to patient leaving prior to being seen by health care provider: Secondary | ICD-10-CM

## 2022-12-17 ENCOUNTER — Ambulatory Visit (HOSPITAL_BASED_OUTPATIENT_CLINIC_OR_DEPARTMENT_OTHER): Payer: No Typology Code available for payment source | Admitting: Specialist

## 2022-12-17 NOTE — Progress Notes (Signed)
Pt did not attend today

## 2022-12-23 ENCOUNTER — Ambulatory Visit (HOSPITAL_BASED_OUTPATIENT_CLINIC_OR_DEPARTMENT_OTHER): Payer: No Typology Code available for payment source | Admitting: Clinical

## 2022-12-23 ENCOUNTER — Ambulatory Visit: Payer: 59 | Admitting: Family Medicine

## 2022-12-30 ENCOUNTER — Ambulatory Visit: Payer: No Typology Code available for payment source | Admitting: Clinical

## 2022-12-30 ENCOUNTER — Encounter (HOSPITAL_BASED_OUTPATIENT_CLINIC_OR_DEPARTMENT_OTHER): Payer: Self-pay | Admitting: Family Medicine

## 2022-12-30 ENCOUNTER — Ambulatory Visit (HOSPITAL_BASED_OUTPATIENT_CLINIC_OR_DEPARTMENT_OTHER): Payer: 59 | Admitting: Family Medicine

## 2022-12-30 DIAGNOSIS — F1120 Opioid dependence, uncomplicated: Secondary | ICD-10-CM

## 2022-12-30 MED ORDER — BUPRENORPHINE HCL-NALOXONE HCL 8-2 MG SL FILM
ORAL_FILM | SUBLINGUAL | 0 refills | Status: DC
Start: 2022-12-30 — End: 2023-01-06

## 2022-12-30 NOTE — Progress Notes (Signed)
.  Discussed with Suboxone group team, encouraged/confirmed communication with OBAT RN for any missed appointments.  Given ongoing benefit of OAT and consistent engagement in group, rx written.  PMDP reviewed and appropriate.    +COVID last week, recovering now.  Missed appt today but contacted OBAT RN, will plan for next week visit.  Horatio Pel, MD, 12/30/2022

## 2022-12-31 ENCOUNTER — Ambulatory Visit: Payer: No Typology Code available for payment source | Attending: Physician Assistant | Admitting: Specialist

## 2022-12-31 ENCOUNTER — Ambulatory Visit (HOSPITAL_BASED_OUTPATIENT_CLINIC_OR_DEPARTMENT_OTHER): Payer: No Typology Code available for payment source | Admitting: Specialist

## 2022-12-31 ENCOUNTER — Other Ambulatory Visit: Payer: Self-pay

## 2022-12-31 DIAGNOSIS — M1711 Unilateral primary osteoarthritis, right knee: Secondary | ICD-10-CM | POA: Insufficient documentation

## 2022-12-31 NOTE — Addendum Note (Signed)
Addended by: Gabriel Rainwater on: 12/31/2022 11:55 AM     Modules accepted: Orders, Level of Service

## 2022-12-31 NOTE — Progress Notes (Signed)
Orthopedic Office Note    CC: Patient presents with:  Knee Pain: Right knee    ORTHOPEDIC PROBLEM LIST:  1. Right knee pain     HPI: Cheryl Dixon is a 67 year old female who is here today for follow up of right knee pain. Patient has a longstanding history of severe right knee OA. She has had multiple cortisone injections, most recently 07/11/22, which gave her relief for around a month. Patient localizes her pain to the medial and lateral joint lines. She would like to move forward with planning for a right TKA. Patient has already completed a joint class (07/03/21). She quit smoking last year. Patient ambulates with a cane for support.  Denies any numbness or tingling distally.    IMAGING: x-ray of the right knee reveals severe narrowing of the medial compartment of the right knee with subchondral cystic changes along the distal femur and proximal tibia.   Imaging was reviewed by myself and Dr. Sallee Lange during this visit.     PHYSICAL EXAM:  GENERAL: Alert and oriented, in no acute distress  MOOD: Appropriate.   MUSCULOSKELETAL: Evaluation of the right knee reveals no gross bony deformities. Overlying skin is warm, dry and intact. There is no erythema, edema, ecchymosis or effusion. TTP at the medial and lateral joint lines. Ligaments are stable to varus and valgus stress. ROM 0-90 degrees. RLE is neurovascularly intact distally.    ASSESSMENT/PLAN: 67 year old female presents today for follow up of right knee pain. X-ray of the right knee reveals severe narrowing of the medial compartment. Patient has received multiple cortisone injections in the past but no longer finds good relief from them. She would like to move forward with a R TKA. Patient does not smoke- quit in 2023. Patient must obtain medical and dental clearance prior to surgery. Tentative surgical date in November. Patient was provided with an information folder and has already completed a joint class (07/03/21). We will see her back around 3 weeks before  her surgery date.     We reviewed the likely diagnosis, the prognosis, and various treatment options in detail. Risks and benefits of treatment plan discussed. The patient's questions have been answered, and the patient understands agrees with treatment plan.     Dr. Sallee Lange saw and examined the patient and formulated the assessment and plan.    Mikey College, PA-C, 12/31/2022    Nursing Communication:  None

## 2022-12-31 NOTE — Progress Notes (Signed)
I saw this patient in conjunction with Lauren Crusha PA-C.  I am in agreement with the evaluation, assessment and plan.  I personally interviewed this patient, performed a physical examination, discussed the findings and diagnosis with the patient and made recommendations relative to treatment and plan.     In addition, once again independently reviewed applicable imaging studies of the right knee.    In brief, patient continues to have pain in her right knee limiting activity of daily living.  She has a history of advanced degenerative arthritis of the knee unresponsive to conservative measures.  She has had diminishing returns with steroid injections given in the past.    It should be recalled the patient was progressing with preparation to proceed with total knee arthroplasty, right.  She had some medical issues to clear up.  All is well in that regard now.    She has given up cigarette smoking altogether.  She is on Suboxone therapy and compliant with her regimen.  She had a bout of COVID in July but has recovered nicely.    On exam of the right knee there is moderate varus deformity.  Skin around the knee unremarkable.  No untoward swelling or effusion.  She can extend the knee just about fully and flex to 110 degrees.    The patient has advanced degenerative arthritis of the right knee with acquired genu varum.  The bulk of the involvement radiographically is of the medial compartment and the patellofemoral joint.  She does wish to move forward with total knee arthroplasty, right.  She has had a joint class done already.  She will need medical and dental clearance.  This will be arranged.  Arrangements will be made to proceed with the operation in the not-too-distant future.  Given her comorbid medical conditions including history of COPD and prior CVA she will require inpatient stay after the procedure for close monitoring and management of her medical conditions.  She will be seen a few weeks prior for  complete discussion regarding risk, benefits, anticipated outcome.    Anselm Pancoast. Sallee Lange, MD  December 31, 2022

## 2023-01-01 NOTE — Progress Notes (Signed)
Erroneous encounter - please disregard. Pt did not attend today.

## 2023-01-04 ENCOUNTER — Other Ambulatory Visit (HOSPITAL_BASED_OUTPATIENT_CLINIC_OR_DEPARTMENT_OTHER): Payer: Self-pay | Admitting: Physician Assistant

## 2023-01-04 DIAGNOSIS — J441 Chronic obstructive pulmonary disease with (acute) exacerbation: Secondary | ICD-10-CM

## 2023-01-04 DIAGNOSIS — R051 Acute cough: Secondary | ICD-10-CM

## 2023-01-04 NOTE — Telephone Encounter (Signed)
PER Pharmacy, Cheryl Dixon is a 67 year old female has requested a refill of Cetirizine 10 mg.      Last OFFICE Visit:  11/13/2022 Maricela Bo, PA-C     Last TELE Visit: Recent Visits  Date Type Provider Dept   11/20/22 Clyda Greener, Angelena Sole [facility]   11/18/22 Televisit Horatio Pel, MD [facility]   11/04/22 Charolette Child, MD [facility]   10/07/22 Charolette Child, MD [facility]   09/09/22 Sung Amabile, MD [facility]   08/12/22 Sung Amabile, MD [facility]   07/15/22 Sung Amabile, MD [facility]   06/24/22 Sung Amabile, MD [facility]   03/25/22 Sung Amabile, MD [facility]   03/25/22 Televisit Roll, Onalee Hua, MD [facility]   Showing recent visits within past 365 days with a meds authorizing provider and meeting all other requirements  Future Appointments  No visits were found meeting these conditions.  Showing future appointments within next 0 days with a meds authorizing provider and meeting all other requirements      Last Physical Exam: 05/27/2013     There are no preventive care reminders to display for this patient.    Other Med Adult:  Most Recent BP Reading(s)  12/08/22 : 147/88        Cholesterol (mg/dL)   Date Value   32/20/2542 212     LOW DENSITY LIPOPROTEIN DIRECT (mg/dL)   Date Value   70/62/3762 151     HIGH DENSITY LIPOPROTEIN (mg/dL)   Date Value   83/15/1761 47     TRIGLYCERIDES (mg/dL)   Date Value   60/73/7106 119         THYROID SCREEN TSH REFLEX FT4 (uIU/mL)   Date Value   08/20/2022 4.100         No results found for: "TSH"    HEMOGLOBIN A1C (%)   Date Value   08/20/2022 5.6       No results found for: "POCA1C"      INR (no units)   Date Value   12/12/2021 1.1   02/16/2007 1.0 (L)   07/03/2006 < 1.0 (L)       SODIUM (mmol/L)   Date Value   08/20/2022 141       POTASSIUM (mmol/L)   Date Value   08/20/2022 4.9           CREATININE (mg/dL)   Date Value   26/94/8546 0.9       Documented  patient preferred pharmacies:    [id number] - [place], [place] - [address]  Phone: [phone] Fax: [phone]

## 2023-01-05 ENCOUNTER — Encounter (HOSPITAL_BASED_OUTPATIENT_CLINIC_OR_DEPARTMENT_OTHER): Payer: Self-pay

## 2023-01-05 ENCOUNTER — Ambulatory Visit (HOSPITAL_BASED_OUTPATIENT_CLINIC_OR_DEPARTMENT_OTHER): Payer: No Typology Code available for payment source | Admitting: Obstetrics & Gynecology

## 2023-01-05 NOTE — Progress Notes (Deleted)
C.C: chronic dysuria    HPI: 67 yo patient  Per PCP referral:  - chronic urinary frequency, dysuria taking solifenacin and estrogen vaginal cream. Previously seen at [facility], would like to  be seen at [facility]       ,

## 2023-01-06 ENCOUNTER — Ambulatory Visit: Payer: No Typology Code available for payment source | Admitting: Registered Nurse

## 2023-01-06 ENCOUNTER — Other Ambulatory Visit (HOSPITAL_BASED_OUTPATIENT_CLINIC_OR_DEPARTMENT_OTHER): Payer: Self-pay | Admitting: Family Medicine

## 2023-01-06 DIAGNOSIS — F1120 Opioid dependence, uncomplicated: Secondary | ICD-10-CM

## 2023-01-06 MED ORDER — BUPRENORPHINE HCL-NALOXONE HCL 8-2 MG SL FILM
ORAL_FILM | SUBLINGUAL | 0 refills | Status: DC
Start: 2023-01-06 — End: 2023-01-13

## 2023-01-13 ENCOUNTER — Ambulatory Visit: Payer: No Typology Code available for payment source | Attending: Psychosomatic Medicine | Admitting: Clinical

## 2023-01-13 ENCOUNTER — Ambulatory Visit: Payer: 59 | Admitting: Family Medicine

## 2023-01-13 ENCOUNTER — Encounter (HOSPITAL_BASED_OUTPATIENT_CLINIC_OR_DEPARTMENT_OTHER): Payer: Self-pay

## 2023-01-13 ENCOUNTER — Other Ambulatory Visit (HOSPITAL_BASED_OUTPATIENT_CLINIC_OR_DEPARTMENT_OTHER): Payer: Self-pay | Admitting: Internal Medicine

## 2023-01-13 DIAGNOSIS — F1120 Opioid dependence, uncomplicated: Secondary | ICD-10-CM

## 2023-01-13 DIAGNOSIS — F3341 Major depressive disorder, recurrent, in partial remission: Secondary | ICD-10-CM | POA: Insufficient documentation

## 2023-01-13 MED ORDER — BUPRENORPHINE HCL-NALOXONE HCL 8-2 MG SL FILM
ORAL_FILM | SUBLINGUAL | 0 refills | Status: DC
Start: 2023-01-13 — End: 2023-02-10

## 2023-01-13 NOTE — Progress Notes (Signed)
Cheryl Dixon is a 67 year old female seen in follow up for opioid use disorder:    Buprenorphine dose: 2.5  tabs daily (x8mg )  Response, adequacy of dose: good  Relapses/close calls: No  Meetings: attends every 4+ week  Non-prescribed substance use: None reported      Social history/events:  none  Additional psychiatric or medical care/events:No    Present Medications:  buprenorphine-naloxone (SUBOXONE) 8-2 MG sublingual film, Take 2.5 films under the tongue daily., Disp: 70 Film, Rfl: 0  cetirizine (ZYRTEC) 10 MG tablet, TAKE 1 TABLET BY MOUTH EVERY DAY IN THE MORNING, Disp: 90 tablet, Rfl: 3  busPIRone (BUSPAR) 5 MG tablet, TAKE 1 TABLET BY MOUTH IN THE MORNING AND BEFORE BEDTIME, Disp: 180 tablet, Rfl: 1  solifenacin (VESICARE) 10 MG tablet, Take 1 tablet by mouth in the morning., Disp: 90 tablet, Rfl: 0  famotidine (PEPCID) 20 MG tablet, Take 1 tablet by mouth in the morning and 1 tablet before bedtime., Disp: 60 tablet, Rfl: 2  naproxen (NAPROSYN) 500 MG tablet, TAKE 1 TABLET BY MOUTH IN THE MORNING AND IN THE EVENING WITH MEALS, Disp: 60 tablet, Rfl: 2  risedronate (ACTONEL) 150 MG tablet, Take 1 tablet by mouth every 30 (thirty) days with full glass of water on empty stomach-nothing else by mouth and do not lie down for 1/2 hour, Disp: 3 tablet, Rfl: 3  tiotropium-olodaterol (STIOLTO RESPIMAT) 2.5-2.5 MCG/ACT inhal, Inhale 2 puffs into the lungs in the morning., Disp: 3 each, Rfl: 3  hydrocortisone 2.5 % cream, Apply topically 2 (two) times daily To affected areas of scalp and ears, Disp: 60 g, Rfl: 1  VITAMIN D3 50 MCG (2000 UT) TABS tablet, TAKE 1 TABLET BY MOUTH EVERY DAY IN THE MORNING, Disp: 90 tablet, Rfl: 3  atorvastatin (LIPITOR) 80 MG tablet, Take 1 tablet by mouth Daily after dinner, Disp: 90 tablet, Rfl: 3  polyethylene glycol (GLYCOLAX/MIRALAX) 17 g packet, Take 1 packet by mouth in the morning., Disp: 90 packet, Rfl: 3  docusate sodium (COLACE) 100 MG capsule, Take 1 capsule by mouth in the  morning and 1 capsule before bedtime., Disp: 180 capsule, Rfl: 3  aspirin 81 MG chewable tablet, Take 1 tablet by mouth in the morning., Disp: 90 tablet, Rfl: 3  bisacodyl (DULCOLAX) 5 MG EC tablet, Take 8 tablets by mouth See Admin Instructions Take as directed prior to colonoscopy, Disp: 8 tablet, Rfl: 0  buPROPion (WELLBUTRIN SR) 200 MG 12 hr tablet, Take 1 tablet by mouth in the morning and 1 tablet before bedtime., Disp: 180 tablet, Rfl: 3  albuterol HFA 108 (90 Base) MCG/ACT inhaler, INHALE 2 PUFFS INTO THE LUNGS EVERY 6 HOURS AS NEEDED FOR WHEEZING OR SHORTNESS OF BREATH, Disp: 8.5 g, Rfl: 11  acetaminophen (TYLENOL) 500 MG tablet, Take 500 mg by mouth every 6 (six) hours as needed for Pain, Disp: , Rfl:   ipratropium-albuterol (DUO-NEB) 0.5-2.5 (3) MG/3ML SOLN Inhalation Solution, Take 3 mLs by nebulization 4 (four) times daily., Disp: 180 mL, Rfl: 0    No current facility-administered medications on file prior to visit.      Last UDS:  LAST UDS   AMPHETAMINES URINE (ng/mL)   Date Value   10/27/2022 NEGATIVE     COCAINE METABOLITES URINE (ng/mL)   Date Value   10/27/2022 NEGATIVE     OPIATES URINE (ng/mL)   Date Value   10/27/2022 NEGATIVE     BENZODIAZEPINES URINE (ng/mL)   Date Value   10/27/2022 NEGATIVE  CANNABINOIDS URINE (ng/mL)   Date Value   10/27/2022 POSITIVE (A)     ETHANOL URINE (mg/dL)   Date Value   16/01/9603 NEGATIVE       Review of Systems: no notable symptoms    PHYSICAL EXAMINATION: by video  General appearance - healthy female in no distress  Eyes - pupils normal  Skin - warm and dry  Neuro - nonfocal  Affect - normal    Problem List Items Addressed This Visit    None       Today in group we discussed breaking and replacing bad habits Ghita Loesch contributed.    Assessment:  Opioid use disorder  Comment: the patient actively participated and shared experiences, interacting appropriately with group without barriers to understanding  Plan:  continue current regimen with scheduled follow  up.  Patient is counseled regarding relapse prevention, involvement in recovery groups, and potential side effects of buprenorphine.    Horatio Pel, MD

## 2023-01-14 NOTE — Telephone Encounter (Signed)
PER Patient (self), Cheryl Dixon is a 67 year old female has requested a refill of    NAPROXEN   BUPROPION       Last Office Visit: 01/13/2023   With Horatio Pel, MD      Last Physical Exam:04/2013    There are no preventive care reminders to display for this patient.    Other Med Adult:  Most Recent BP Reading(s)  12/08/22 : 147/88        Cholesterol (mg/dL)   Date Value   16/01/9603 212     LOW DENSITY LIPOPROTEIN DIRECT (mg/dL)   Date Value   54/12/8117 151     HIGH DENSITY LIPOPROTEIN (mg/dL)   Date Value   14/78/2956 47     TRIGLYCERIDES (mg/dL)   Date Value   21/30/8657 119         THYROID SCREEN TSH REFLEX FT4 (uIU/mL)   Date Value   08/20/2022 4.100         No results found for: "TSH"    HEMOGLOBIN A1C (%)   Date Value   08/20/2022 5.6       No results found for: "POCA1C"      INR (no units)   Date Value   12/12/2021 1.1   02/16/2007 1.0 (L)   07/03/2006 < 1.0 (L)       SODIUM (mmol/L)   Date Value   08/20/2022 141       POTASSIUM (mmol/L)   Date Value   08/20/2022 4.9           CREATININE (mg/dL)   Date Value   84/69/6295 0.9       Documented patient preferred pharmacies:    [id number] - [place], [place] - [address]  Phone: [phone] Fax: [phone]

## 2023-01-15 ENCOUNTER — Other Ambulatory Visit (HOSPITAL_BASED_OUTPATIENT_CLINIC_OR_DEPARTMENT_OTHER): Payer: Self-pay | Admitting: Family Medicine

## 2023-01-15 DIAGNOSIS — N3281 Overactive bladder: Secondary | ICD-10-CM

## 2023-01-15 NOTE — Telephone Encounter (Signed)
PER Patient (self), Cheryl Dixon is a 67 year old female has requested a refill of VESICARE.      Last OFFICE Visit:  11/13/2022 Maricela Bo, PA-C     Last TELE Visit: Recent Visits  Date Type Provider Dept   01/13/23 Loman Brooklyn, Rulon Eisenmenger, MD [facility]   11/20/22 Televisit [address], Angelena Sole [facility]   11/18/22 Televisit Horatio Pel, MD [facility]   11/04/22 Charolette Child, MD [facility]   10/07/22 Charolette Child, MD [facility]   09/09/22 Sung Amabile, MD [facility]   08/12/22 Sung Amabile, MD [facility]   07/15/22 Sung Amabile, MD [facility]   06/24/22 Sung Amabile, MD [facility]   03/25/22 Sung Amabile, MD [facility]   Showing recent visits within past 365 days with a meds authorizing provider and meeting all other requirements  Future Appointments  No visits were found meeting these conditions.  Showing future appointments within next 0 days with a meds authorizing provider and meeting all other requirements      Last Physical Exam: 05/27/2013     There are no preventive care reminders to display for this patient.    Other Med Adult:  Most Recent BP Reading(s)  12/08/22 : 147/88        Cholesterol (mg/dL)   Date Value   33/29/5188 212     LOW DENSITY LIPOPROTEIN DIRECT (mg/dL)   Date Value   41/66/0630 151     HIGH DENSITY LIPOPROTEIN (mg/dL)   Date Value   16/04/930 47     TRIGLYCERIDES (mg/dL)   Date Value   35/57/3220 119         THYROID SCREEN TSH REFLEX FT4 (uIU/mL)   Date Value   08/20/2022 4.100         No results found for: "TSH"    HEMOGLOBIN A1C (%)   Date Value   08/20/2022 5.6       No results found for: "POCA1C"      INR (no units)   Date Value   12/12/2021 1.1   02/16/2007 1.0 (L)   07/03/2006 < 1.0 (L)       SODIUM (mmol/L)   Date Value   08/20/2022 141       POTASSIUM (mmol/L)   Date Value   08/20/2022 4.9           CREATININE (mg/dL)   Date Value   25/42/7062 0.9        Documented patient preferred pharmacies:    [id number] - [place], [place] - [address]  Phone: [phone] Fax: [phone]     [facility], [place], [place] - [address]  Phone: [phone] Fax: [phone]

## 2023-01-15 NOTE — Progress Notes (Signed)
OUTPATIENT PSYCHIATRY GROUP PROGRESS NOTE    For the patient participating in the group via telephone and/or video technology: The patient/guardian has verbally consented to participate in this group therapy session by televisit. The patient/guardian was encouraged to be in a private location due to personal health information being discussed and where they could not be overheard by individuals not participating in the group therapy. Although all [place] staff/providers participating in this group therapy televisit are in a private location, all the risks of telephone and/or video technology used to conduct this group therapy session cannot be controlled by [place], (i.e. interruptions, unauthorized access/recording and technical difficulties).  Each patient understands that the visit can be stopped at any time for any reason, including if the conferencing connections are inadequate.    Cheryl Dixon is a 67 year old, Divorced, female, who presented for [facility] group visit.   Length of group: 60 min which included 30 min of Health Behavior Intervention and OUD Psychoeducation.    Group Name: OBOT: Office Based Opioid Treatment Group    Group Topic Today: Breaking Bad Habits and Achieving Recovery: Strategies for Replacing Bad Habits. Members discussed strategies for replacing bad habits.  Members were encouraged to share their personal experiences and challenges related to bad habits and recovery, fostering a supportive environment where strategies and tips for replacing bad habits could be exchanged.    Leaders: Horatio Pel, MD, Milta Deiters, Ph.D. and Boneta Lucks, RN    Service Type: [phone] Group Psychotherapy     Number of participants in group today:  10         Purpose of Group (choose all that apply):   Skills training/rehab  Support (psychological, family, community resources, recovery)  Insight and behavior change  Increasing health promoting behaviors  Relapse prevention and enhancing Recovery  Process  Education about Opioid Use Disorder (OUD) and treatment  Enhancing adjustment and coping with OUD  Improving management of OUD and treatment adherence  Decreasing health risk behaviors and risks of relapse  Addressing psychological/psychosocial/behavioral barriers to preventing addiction and relapse          Group Process:    Review of group goals/structure  Education about Opioid Use Disorder (OUD) and related topics  Group discussion and sharing.    Review of strategies to increase healthy coping mechanisms to support recovery and change.  Review of additional relapse prevention strategies.   Discussion of support strategies  Check in        Individual Patient Participation:    Attentive to process  Contributed constructively  Participated in group discussion about changing habits. Reported doing well. She would like to try Sublocade.  No relapse/close calls disclosed.       Medical Necessity of Session (how treatment is necessary to improve symptoms, functioning, or prevent worsening): treatment is necessary to enhance compliance with agonist treatment and maintain abstinence from opioids. Suboxone group participation is mandatory to continue Suboxone prescription and is necessary in order to enhance coping with Opioid Use Disorder.      Relevant Changes in Mental Status: No. Mood seems euthymic; no acute issues reported or noted      Risk Level per Scale:  Suicide: re-assessment not indicated today  Violence: re-assessment not indicated today  Addiction: low     Current risk level represents increase in risk: No.     DIAGNOSES:     Opioid dependence on agonist therapy (HCC)  (primary encounter diagnosis)   SNOMED CT(R): OPIOID DEPENDENCE, ON AGONIST THERAPY  MDD (major depressive disorder), recurrent, in partial remission (HCC)   SNOMED CT(R): RECURRENT MAJOR DEPRESSION IN PARTIAL REMISSION      REVIEWING TODAY'S VISIT:   Reviewing Today's Visit:  Clinical Interventions Today: Motivational  Interviewing; Cognitive Behavioral Therapy (CBT); Relapse Prevention for SUDs  Patient's Response to Interventions: Responsive  Time Spent in Psychotherapy: 60 min            CARE PLAN/ EPISODES:  Linked Episodes   Type: Episode: Status: Noted: Resolved: Last update: Updated by:   PCBHI PCBHI OBOT  Active 09/11/2020  01/15/2023 11:36 PM Milta Deiters, PhD      Comments:       Patient/Guardian:  contributed to the creation of the treatment plan.    Strengths/Skills: Help seeking and motivated to get better     Potential Barriers: Multiple life stressors       Patient stated goals: Pt would like to prevent relapses and support her recovery process     Problem 1: Opioid Dependence on Agonist Therapy (Opioid Use Disorder on Agonist Therapy)      Short Term Goals: Pt will use at least one copying skill a day to reduce a risk of relapse, enhance coping with OUD, and support her recovery process     Short Term Target:  60 days  Short TermTarget Date: 03/14/2023  Short Term Goal #1 Progress: progressing     Long Term Goals: Pt will report 50% in urges and cravings reduction and no relapses or close calls       Long Term Target:  90 days  Long TermTarget Date:  04/13/2023   Long Term Goal #1 Progress:  progressing      Intervention #1: PCBHI-based OBOT/Group Psychotherapy   Intervention Frequency:  biweekly  Intervention Duration:  90 Days  Intervention Responsibility:  J. Maryann Conners, PhD and the OBOT team                                                                                                                                                                                                                                    Milta Deiters, PhD                                 .

## 2023-01-15 NOTE — BH OP Treatment Plan (Signed)
Patient/Guardian:  contributed to the creation of the treatment plan.    Strengths/Skills: Help seeking and motivated to get better     Potential Barriers: Multiple life stressors       Patient stated goals: Pt would like to prevent relapses and support her recovery process     Problem 1: Opioid Dependence on Agonist Therapy (Opioid Use Disorder on Agonist Therapy)      Short Term Goals: Pt will use at least one copying skill a day to reduce a risk of relapse, enhance coping with OUD, and support her recovery process     Short Term Target:  60 days  Short TermTarget Date: 03/14/2023  Short Term Goal #1 Progress: progressing     Long Term Goals: Pt will report 50% in urges and cravings reduction and no relapses or close calls       Long Term Target:  90 days  Long TermTarget Date:  04/13/2023   Long Term Goal #1 Progress:  progressing      Intervention #1: PCBHI-based OBOT/Group Psychotherapy   Intervention Frequency:  biweekly  Intervention Duration:  90 Days  Intervention Responsibility:  J. Maryann Conners, PhD and the OBOT team

## 2023-01-16 MED ORDER — SOLIFENACIN SUCCINATE 10 MG PO TABS
10.0000 mg | ORAL_TABLET | Freq: Every day | ORAL | 0 refills | Status: DC
Start: 2023-01-16 — End: 2023-03-20

## 2023-01-16 NOTE — Telephone Encounter (Signed)
Reviewed chart, refill approved.

## 2023-01-17 ENCOUNTER — Telehealth (HOSPITAL_BASED_OUTPATIENT_CLINIC_OR_DEPARTMENT_OTHER): Payer: Self-pay | Admitting: Family Medicine

## 2023-01-17 NOTE — Telephone Encounter (Signed)
On-call page: refill needed    Received page stating she has been out of solifenacin since 1 week ago.  I reviewed chart and see that this was sent in yesterday by provider team.      I returned call to patient and went to VM.  I left a VM stating that medication was sent yesterday and she should contact pharmacy.

## 2023-01-19 ENCOUNTER — Telehealth (HOSPITAL_BASED_OUTPATIENT_CLINIC_OR_DEPARTMENT_OTHER): Payer: Self-pay | Admitting: Internal Medicine

## 2023-01-19 NOTE — Telephone Encounter (Signed)
Called and spoke to patient and daughter.  Reports,there are lots of safety concerns at the building/housing property she is residing currently.  Reports,the toilet she has is really low and she needs higher .  She does not have arm bars and the tub is too high and has glass door on the shower which is not a safe situation.  Reports,pt has fallen twice already.  Getting Knee surgery done in November.    Reports,she would like PCP yo write a letter to housing property stating,due to her disability/medical condition,she needs a accomodation( higher toilet,arms bars) in her housing.  Reports,they will pick up once ready.  Advised,I will relay message to PCP.        ----- Message from [facility] S sent at 01/19/2023  2:43 PM EDT -----  Regarding: Handicap letter    Patient needs a letter  for housing confirming her handicap status. She also needs the letter to states she needs       1)A higher toilet,  2) A shower bar or 2 installed. One for entering, and another for exiting the shower.       Call back to discuss further. tronda relford (caretaker) [phone]

## 2023-01-19 NOTE — Telephone Encounter (Signed)
-----   Message from [facility] S sent at 01/19/2023  2:43 PM EDT -----  Regarding: Handicap letter    Patient needs a letter  for housing confirming her handicap status. She also needs the letter to states she needs       1)A higher toilet,  2) A shower bar or 2 installed. One for entering, and another for exiting the shower.       Call back to discuss further. daniely ahuja (caretaker) [phone]

## 2023-01-21 ENCOUNTER — Encounter (HOSPITAL_BASED_OUTPATIENT_CLINIC_OR_DEPARTMENT_OTHER): Payer: Self-pay

## 2023-01-21 NOTE — Telephone Encounter (Signed)
Letter sent via MyChart.  I'll print out a hard copy to pick up later today.

## 2023-01-21 NOTE — Telephone Encounter (Signed)
Printed/signed

## 2023-01-27 ENCOUNTER — Ambulatory Visit (HOSPITAL_BASED_OUTPATIENT_CLINIC_OR_DEPARTMENT_OTHER): Payer: 59 | Admitting: Family Medicine

## 2023-01-27 ENCOUNTER — Ambulatory Visit (HOSPITAL_BASED_OUTPATIENT_CLINIC_OR_DEPARTMENT_OTHER): Payer: No Typology Code available for payment source | Admitting: Clinical

## 2023-01-28 ENCOUNTER — Other Ambulatory Visit (HOSPITAL_BASED_OUTPATIENT_CLINIC_OR_DEPARTMENT_OTHER): Payer: Self-pay | Admitting: Family Medicine

## 2023-01-28 DIAGNOSIS — N3281 Overactive bladder: Secondary | ICD-10-CM

## 2023-02-04 ENCOUNTER — Other Ambulatory Visit (HOSPITAL_BASED_OUTPATIENT_CLINIC_OR_DEPARTMENT_OTHER): Payer: Self-pay | Admitting: Pediatrics

## 2023-02-05 NOTE — Telephone Encounter (Signed)
The pharmacy is requesting the refill

## 2023-02-05 NOTE — Telephone Encounter (Signed)
PER Pharmacy, Cheryl Dixon is a 67 year old female has requested a refill of premarin.      Last OFFICE Visit:  11/13/2022 Maricela Bo, PA-C     Last TELE Visit: Recent Visits  Date Type Provider Dept   01/13/23 Loman Brooklyn, Rulon Eisenmenger, MD [facility]   11/20/22 Televisit [address], Angelena Sole [facility]   11/18/22 Televisit Horatio Pel, MD [facility]   11/04/22 Charolette Child, MD [facility]   10/07/22 Charolette Child, MD [facility]   09/09/22 Sung Amabile, MD [facility]   08/12/22 Sung Amabile, MD [facility]   07/15/22 Sung Amabile, MD [facility]   06/24/22 Sung Amabile, MD [facility]   03/25/22 Sung Amabile, MD [facility]   Showing recent visits within past 365 days with a meds authorizing provider and meeting all other requirements  Future Appointments  No visits were found meeting these conditions.  Showing future appointments within next 0 days with a meds authorizing provider and meeting all other requirements      Last Physical Exam: 05/27/2013     There are no preventive care reminders to display for this patient.    Other Med Adult:  Most Recent BP Reading(s)  12/08/22 : 147/88        Cholesterol (mg/dL)   Date Value   16/01/9603 212     LOW DENSITY LIPOPROTEIN DIRECT (mg/dL)   Date Value   54/12/8117 151     HIGH DENSITY LIPOPROTEIN (mg/dL)   Date Value   14/78/2956 47     TRIGLYCERIDES (mg/dL)   Date Value   21/30/8657 119         THYROID SCREEN TSH REFLEX FT4 (uIU/mL)   Date Value   08/20/2022 4.100         No results found for: "TSH"    HEMOGLOBIN A1C (%)   Date Value   08/20/2022 5.6       No results found for: "POCA1C"      INR (no units)   Date Value   12/12/2021 1.1   02/16/2007 1.0 (L)   07/03/2006 < 1.0 (L)       SODIUM (mmol/L)   Date Value   08/20/2022 141       POTASSIUM (mmol/L)   Date Value   08/20/2022 4.9           CREATININE (mg/dL)   Date Value   84/69/6295 0.9        Documented patient preferred pharmacies:    [facility], [place], [place] - [address]  Phone: [phone] Fax: [phone]

## 2023-02-05 NOTE — Telephone Encounter (Signed)
This is a refill request for a different formulation of vaginal estrogen than I have prescribed previously.    Did patient specifically request a different medication, or does she just want a refill of the old medication.

## 2023-02-07 ENCOUNTER — Other Ambulatory Visit (HOSPITAL_BASED_OUTPATIENT_CLINIC_OR_DEPARTMENT_OTHER): Payer: Self-pay | Admitting: Family Medicine

## 2023-02-07 DIAGNOSIS — R1013 Epigastric pain: Secondary | ICD-10-CM

## 2023-02-07 NOTE — Telephone Encounter (Signed)
PER Pharmacy, Cheryl Dixon is a 67 year old female has requested a refill of famotidine.      Last Office Visit: [id number] with Bunnie Pion  Last Physical Exam: [id number]      Other Med Adult:  Most Recent BP Reading(s)  12/08/22 : 147/88        Cholesterol (mg/dL)   Date Value   38/01/1750 212     LOW DENSITY LIPOPROTEIN DIRECT (mg/dL)   Date Value   02/58/5277 151     HIGH DENSITY LIPOPROTEIN (mg/dL)   Date Value   82/42/3536 47     TRIGLYCERIDES (mg/dL)   Date Value   14/43/1540 119         THYROID SCREEN TSH REFLEX FT4 (uIU/mL)   Date Value   08/20/2022 4.100         No results found for: "TSH"    HEMOGLOBIN A1C (%)   Date Value   08/20/2022 5.6       No results found for: "POCA1C"      INR (no units)   Date Value   12/12/2021 1.1   02/16/2007 1.0 (L)   07/03/2006 < 1.0 (L)       SODIUM (mmol/L)   Date Value   08/20/2022 141       POTASSIUM (mmol/L)   Date Value   08/20/2022 4.9           CREATININE (mg/dL)   Date Value   08/67/6195 0.9       Documented patient preferred pharmacies:    [id number] - [place], [place] - [address]  Phone: [phone] Fax: [phone]

## 2023-02-10 ENCOUNTER — Ambulatory Visit: Payer: No Typology Code available for payment source | Attending: Family Medicine | Admitting: Family Medicine

## 2023-02-10 ENCOUNTER — Ambulatory Visit: Payer: No Typology Code available for payment source | Attending: Psychosomatic Medicine | Admitting: Clinical

## 2023-02-10 DIAGNOSIS — F3341 Major depressive disorder, recurrent, in partial remission: Secondary | ICD-10-CM | POA: Diagnosis present

## 2023-02-10 DIAGNOSIS — F1120 Opioid dependence, uncomplicated: Secondary | ICD-10-CM | POA: Insufficient documentation

## 2023-02-10 MED ORDER — BUPRENORPHINE HCL-NALOXONE HCL 8-2 MG SL FILM
ORAL_FILM | SUBLINGUAL | 0 refills | Status: DC
Start: 2023-02-10 — End: 2023-03-10

## 2023-02-10 NOTE — Progress Notes (Signed)
Cheryl Dixon is a 67 year old female seen in follow up for opioid use disorder:    Buprenorphine dose: 2.5  tabs daily (x8mg )  Response, adequacy of dose: good  Relapses/close calls: No  Meetings: attends every 4+ week  Non-prescribed substance use: None reported      Social history/events:  none  Additional psychiatric or medical care/events: has been on Suboxone since 2018, thinking about whether she could use injection bup for tapering.  But not interested in tapering now    Present Medications:  famotidine (PEPCID) 20 MG tablet, TAKE 1 TABLET BY MOUTH IN THE MORNING AND BEFORE BEDTIME, Disp: 180 tablet, Rfl: 0  estrogen, conjugated, (PREMARIN) 0.625 MG/GM vaginal cream, Place 0.5 grams vaginally in the morning., Disp: 30 g, Rfl: 0  solifenacin (VESICARE) 10 MG tablet, Take 1 tablet by mouth in the morning., Disp: 90 tablet, Rfl: 0  naproxen (NAPROSYN) 500 MG tablet, TAKE 1 TABLET BY MOUTH IN THE MORNING AND IN THE EVENING WITH MEALS, Disp: 60 tablet, Rfl: 2  buPROPion (WELLBUTRIN SR) 200 MG 12 hr tablet, TAKE 1 TABLET BY MOUTH IN THE MORNING AND BEFORE BEDTIME, Disp: 180 tablet, Rfl: 3  buprenorphine-naloxone (SUBOXONE) 8-2 MG sublingual film, Take 2.5 films under the tongue daily., Disp: 70 Film, Rfl: 0  cetirizine (ZYRTEC) 10 MG tablet, TAKE 1 TABLET BY MOUTH EVERY DAY IN THE MORNING, Disp: 90 tablet, Rfl: 3  busPIRone (BUSPAR) 5 MG tablet, TAKE 1 TABLET BY MOUTH IN THE MORNING AND BEFORE BEDTIME, Disp: 180 tablet, Rfl: 1  [DISCONTINUED] famotidine (PEPCID) 20 MG tablet, Take 1 tablet by mouth in the morning and 1 tablet before bedtime., Disp: 60 tablet, Rfl: 2  risedronate (ACTONEL) 150 MG tablet, Take 1 tablet by mouth every 30 (thirty) days with full glass of water on empty stomach-nothing else by mouth and do not lie down for 1/2 hour, Disp: 3 tablet, Rfl: 3  tiotropium-olodaterol (STIOLTO RESPIMAT) 2.5-2.5 MCG/ACT inhal, Inhale 2 puffs into the lungs in the morning., Disp: 3 each, Rfl: 3  hydrocortisone  2.5 % cream, Apply topically 2 (two) times daily To affected areas of scalp and ears, Disp: 60 g, Rfl: 1  VITAMIN D3 50 MCG (2000 UT) TABS tablet, TAKE 1 TABLET BY MOUTH EVERY DAY IN THE MORNING, Disp: 90 tablet, Rfl: 3  atorvastatin (LIPITOR) 80 MG tablet, Take 1 tablet by mouth Daily after dinner, Disp: 90 tablet, Rfl: 3  polyethylene glycol (GLYCOLAX/MIRALAX) 17 g packet, Take 1 packet by mouth in the morning., Disp: 90 packet, Rfl: 3  docusate sodium (COLACE) 100 MG capsule, Take 1 capsule by mouth in the morning and 1 capsule before bedtime., Disp: 180 capsule, Rfl: 3  aspirin 81 MG chewable tablet, Take 1 tablet by mouth in the morning., Disp: 90 tablet, Rfl: 3  bisacodyl (DULCOLAX) 5 MG EC tablet, Take 8 tablets by mouth See Admin Instructions Take as directed prior to colonoscopy, Disp: 8 tablet, Rfl: 0  albuterol HFA 108 (90 Base) MCG/ACT inhaler, INHALE 2 PUFFS INTO THE LUNGS EVERY 6 HOURS AS NEEDED FOR WHEEZING OR SHORTNESS OF BREATH, Disp: 8.5 g, Rfl: 11  acetaminophen (TYLENOL) 500 MG tablet, Take 500 mg by mouth every 6 (six) hours as needed for Pain, Disp: , Rfl:   ipratropium-albuterol (DUO-NEB) 0.5-2.5 (3) MG/3ML SOLN Inhalation Solution, Take 3 mLs by nebulization 4 (four) times daily., Disp: 180 mL, Rfl: 0    No current facility-administered medications on file prior to visit.      Last  UDS:  LAST UDS   AMPHETAMINES URINE (ng/mL)   Date Value   10/27/2022 NEGATIVE     COCAINE METABOLITES URINE (ng/mL)   Date Value   10/27/2022 NEGATIVE     OPIATES URINE (ng/mL)   Date Value   10/27/2022 NEGATIVE     BENZODIAZEPINES URINE (ng/mL)   Date Value   10/27/2022 NEGATIVE     CANNABINOIDS URINE (ng/mL)   Date Value   10/27/2022 POSITIVE (A)     ETHANOL URINE (mg/dL)   Date Value   16/01/9603 NEGATIVE       Review of Systems: no notable symptoms    PHYSICAL EXAMINATION: by video  General appearance - healthy female in no distress  Eyes - pupils normal  Skin - warm and dry  Neuro - nonfocal  Affect -  normal    Problem List Items Addressed This Visit          Mental Health    Opioid dependence on agonist therapy (HCC)        Today in group we discussed resilience. Aulani Shipton contributed their experiences.    Assessment:  Opioid use disorder  Comment: the patient actively participated and shared experiences, interacting appropriately with group without barriers to understanding  Plan:  continue current regimen with scheduled follow up.  Patient is counseled regarding relapse prevention, involvement in recovery groups, and potential side effects of buprenorphine.    Horatio Pel, MD

## 2023-02-12 NOTE — Progress Notes (Signed)
OUTPATIENT PSYCHIATRY GROUP PROGRESS NOTE    For the patient participating in the group via telephone and/or video technology: The patient/guardian has verbally consented to participate in this group therapy session by televisit. The patient/guardian was encouraged to be in a private location due to personal health information being discussed and where they could not be overheard by individuals not participating in the group therapy. Although all [facility] staff/providers participating in this group therapy televisit are in a private location, all the risks of telephone and/or video technology used to conduct this group therapy session cannot be controlled by [facility], (i.e. interruptions, unauthorized access/recording and technical difficulties).  Each patient understands that the visit can be stopped at any time for any reason, including if the conferencing connections are inadequate.    Cheryl Dixon is a 67 year old, Divorced, female, who presented for [facility] group visit.   Length of group: 60 min which included 30 min of Health Behavior Intervention and OUD Psychoeducation.    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 The group discussion focused on Building Resilience. Participants explored the concept of resilience and developed practical skills for handling setbacks, stress, and challenges in healthier ways. Through guided discussions, exercises, and personal reflections, participants learned how to strengthen their mental and emotional resilience, enabling them to better navigate life's difficulties and recover from adversity.    Leaders: Horatio Pel, MD, Milta Deiters, Ph.D., and Boneta Lucks, RN    Service Type: [phone] Group Psychotherapy     Number of participants in group today:  10        Purpose of Group (choose all that apply):   Skills training/rehab  Support (psychological, family, community  resources, recovery)  Insight and behavior change  Increasing health promoting behaviors  Relapse prevention and enhancing Recovery Process  Education about Opioid Use Disorder (OUD) and treatment  Enhancing adjustment and coping with OUD  Improving management of OUD and treatment adherence  Decreasing health risk behaviors and risks of relapse  Addressing psychological/psychosocial/behavioral barriers to preventing addiction and relapse          Group Process:    Review of group goals/structure  Education about Opioid Use Disorder (OUD) and related topics  Group discussion and sharing.    Review of strategies to increase healthy coping mechanisms to support recovery and change.  Review of additional relapse prevention strategies.   Discussion of support strategies  Check in        Individual Patient Participation:    Attentive to process  Contributed constructively  Participated in group discussion about resilience.   Reported doing well. Interested in [place] shot.  No relapse/close calls disclosed.       Medical Necessity of Session (how treatment is necessary to improve symptoms, functioning, or prevent worsening): treatment is necessary to enhance compliance with agonist treatment and maintain abstinence from opioids. Suboxone group participation is mandatory to continue Suboxone prescription and is necessary in order to enhance coping with Opioid Use Disorder.      Relevant Changes in Mental Status: No. Mood seems euthymic; no acute issues reported or noted      Risk Level per Scale:  Suicide: re-assessment not indicated today  Violence: re-assessment not indicated today  Addiction: low     Current risk level represents increase in risk: No.       DIAGNOSES:     Opioid dependence on agonist therapy (  HCC)  (primary encounter diagnosis)   SNOMED CT(R): OPIOID DEPENDENCE, ON AGONIST THERAPY     MDD (major depressive disorder), recurrent, in partial remission (HCC)   SNOMED CT(R): RECURRENT MAJOR DEPRESSION IN  PARTIAL REMISSION      REVIEWING TODAY'S VISIT:   Reviewing Today's Visit:  Clinical Interventions Today: Motivational Interviewing; Cognitive Behavioral Therapy (CBT); Relapse Prevention for SUDs  Patient's Response to Interventions: Responsive  Time Spent in Psychotherapy: 60 min            CARE PLAN/ EPISODES:  Linked Episodes   Type: Episode: Status: Noted: Resolved: Last update: Updated by:   PCBHI PCBHI OBOT  Active 09/11/2020  02/10/2023 10:35 AM Milta Deiters, PhD      Comments:       Patient/Guardian:       Strengths/Skills: Help seeking and motivated to get better     Potential Barriers: Multiple life stressors       Patient stated goals: Pt would like to prevent relapses and support her recovery process     Problem 1: Opioid Dependence on Agonist Therapy (Opioid Use Disorder on Agonist Therapy)      Short Term Goals: Pt will use at least one copying skill a day to reduce a risk of relapse, enhance coping with OUD, and support her recovery process     Short Term Target:  60 days  Short TermTarget Date: 03/14/2023  Short Term Goal #1 Progress:       Long Term Goals: Pt will report 50% in urges and cravings reduction and no relapses or close calls       Long Term Target:  90 days  Long TermTarget Date:  04/13/2023   Long Term Goal #1 Progress:         Intervention #1: PCBHI-based OBOT/Group Psychotherapy   Intervention Frequency:  biweekly  Intervention Duration:  90 Days  Intervention Responsibility:  J. Maryann Conners, PhD and the OBOT team                                                                                                                                                                                                                                    Milta Deiters, PhD

## 2023-02-19 ENCOUNTER — Ambulatory Visit (HOSPITAL_BASED_OUTPATIENT_CLINIC_OR_DEPARTMENT_OTHER): Payer: No Typology Code available for payment source | Admitting: Physician Assistant

## 2023-02-24 ENCOUNTER — Other Ambulatory Visit: Payer: Self-pay

## 2023-02-24 ENCOUNTER — Ambulatory Visit (HOSPITAL_BASED_OUTPATIENT_CLINIC_OR_DEPARTMENT_OTHER): Payer: 59 | Admitting: Registered Nurse

## 2023-02-24 ENCOUNTER — Ambulatory Visit: Payer: No Typology Code available for payment source | Attending: Orthopaedic Surgery | Admitting: Physician Assistant

## 2023-02-24 ENCOUNTER — Ambulatory Visit (HOSPITAL_BASED_OUTPATIENT_CLINIC_OR_DEPARTMENT_OTHER): Payer: No Typology Code available for payment source | Admitting: Clinical

## 2023-02-24 VITALS — BP 128/84 | HR 67 | Temp 97.7°F | Ht 61.81 in | Wt 200.0 lb

## 2023-02-24 DIAGNOSIS — Z01818 Encounter for other preprocedural examination: Secondary | ICD-10-CM | POA: Diagnosis present

## 2023-02-24 DIAGNOSIS — F1120 Opioid dependence, uncomplicated: Secondary | ICD-10-CM | POA: Diagnosis present

## 2023-02-24 DIAGNOSIS — M1711 Unilateral primary osteoarthritis, right knee: Secondary | ICD-10-CM | POA: Insufficient documentation

## 2023-02-24 DIAGNOSIS — I6330 Cerebral infarction due to thrombosis of unspecified cerebral artery: Secondary | ICD-10-CM | POA: Insufficient documentation

## 2023-02-24 LAB — MRSA/MSSA NASAL (PCR): MRSA/MSSA NASAL (PCR): NEGATIVE
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
## 2023-02-24 NOTE — Progress Notes (Signed)
02/24/2023  9:29 AM    CHG wipes given to patient by [facility] [place]  under the direction of MIke Metzmaker.  Directions in English given and reviewed with patient  prior to surgery  Patient informed of number to call at clinic  with any questions.          Pt has ENSURE

## 2023-02-26 ENCOUNTER — Other Ambulatory Visit: Payer: Self-pay

## 2023-02-26 ENCOUNTER — Ambulatory Visit
Admission: RE | Admit: 2023-02-26 | Discharge: 2023-02-26 | Disposition: A | Discharge status: Home / Self Care | Payer: No Typology Code available for payment source

## 2023-02-26 ENCOUNTER — Encounter (HOSPITAL_BASED_OUTPATIENT_CLINIC_OR_DEPARTMENT_OTHER): Payer: Self-pay

## 2023-02-26 ENCOUNTER — Telehealth (HOSPITAL_BASED_OUTPATIENT_CLINIC_OR_DEPARTMENT_OTHER): Payer: Self-pay | Admitting: Pharmacist

## 2023-02-26 NOTE — Surgery Pre-Op (Signed)
PAT Visit          Cheryl Dixon is a 67 year old female received a preoperative screening.        Appointments for Next 30 Days 02/26/2023 - 03/28/2023        Date Visit Type Department Provider     03/05/2023 12:00 PM ESTABLISHED PATIENT [facility] - [facility] Lamona Curl, [place]    Patient Instructions:                  03/09/2023 10:30 AM PRE-ADMISSION TESTING VISIT [facility] - [name] - Pretesting [facility] ANESTHESIA NP    Patient Instructions:     Please bring your current home medications or a list of home medications on the day of your appointment.                 03/10/2023 10:30 AM TELEVISIT GROUP PC [facility] - [facility] Horatio Pel, MD    Patient Instructions:     This group will take place by video or in some cases by phone. You will receive an email with instructions on how to join this visit by Google Meet prior to the appointment.               03/24/2023 10:30 AM TELEVISIT GROUP PC  Primary Care - [facility] Horatio Pel, MD    Patient Instructions:     This group will take place by video or in some cases by phone. You will receive an email with instructions on how to join this visit by Google Meet prior to the appointment.                    Surgery Information       Future Procedures (Tomorrow to 02/26/2024)         Date Time Visit Type/Procedure Providers Loc / Dept Status        03/11/2023 0700 ARTHROPLASTY, KNEE, TOTAL - Right Gabriel Rainwater [facility] OR Sch      Visit Type/Procedure: ARTHROPLASTY, KNEE, TOTAL - Right                          Procedure(s):  ARTHROPLASTY, KNEE, TOTAL  03/11/2023        .     Language of care:English    Allergy History  Darvon, Meperidine Hcl, Paxil [Paroxetine], and Zoloft [Sertraline Hcl]      Past Medical History:  No date: Anxiety  No date: Arthritis  No date: COPD (chronic obstructive pulmonary disease) (HCC)  No date: Depression  No date: Obese  No date: Substance addiction  (HCC)  No date: Varicose veins of lower extremities     has a past surgical history that includes ANES HRNA REPAIR UPR ABD TABDL RPR DIPHRG HRNA; ENDOMETRIAL ABLTJ THERMAL W/O HYSTEROSCOPIC GID; ANES IPR LOWER ABD W/LAPS RAD HYSTERECTOMY; and RPR 1ST INGUN HRNA PRETERM INFT RDC.         Alcohol, Tobacco and Drug History:  Social History    Tobacco Use      Smoking status: Former        Packs/day: 0.00        Years: 1 pack/day for 32.0 years (32.0 ttl pk-yrs)        Types: Cigarettes        Start date: 09/26/1989        Quit date: 09/26/2021  Years since quitting: 1.4      Smokeless tobacco: Never      Tobacco comments: quit 1980-1996, then light until 2003, then 1 ppd since    E-Cigarette/Vaping  E-Cigarette Use: Never User  Quit Date:   Nicotine:   Start Date:   THC:    Cartridges/Day:   CBD:   Other Substance:   Flavoring:       Alcohol use   No       Drug use:         Types:   Marijuana      Comment:   past opioids, nasal heroin, 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  MJ 1x per week          Extended Emergency Contact Information  Primary Emergency Contact: Hilleary,STACY  Address: [address]           [place],  [address] [place]  Home Phone: [phone]  Relation: Daughter        Current Outpatient Medications   Medication Sig    tiotropium-olodaterol (STIOLTO RESPIMAT) 2.5-2.5 MCG/ACT inhal Inhale 2 puffs into the lungs daily    famotidine (PEPCID) 20 MG tablet TAKE 1 TABLET BY MOUTH IN THE MORNING AND BEFORE BEDTIME    buprenorphine-naloxone (SUBOXONE) 8-2 MG sublingual film Take 2.5 films under the tongue daily.    estrogen, conjugated, (PREMARIN) 0.625 MG/GM vaginal cream Place 0.5 grams vaginally in the morning.    solifenacin (VESICARE) 10 MG tablet Take 1 tablet by mouth in the morning.    naproxen (NAPROSYN) 500 MG tablet TAKE 1 TABLET BY MOUTH IN THE MORNING AND IN THE EVENING WITH MEALS    buPROPion (WELLBUTRIN SR) 200 MG 12 hr tablet TAKE 1 TABLET BY MOUTH IN THE MORNING AND BEFORE BEDTIME    cetirizine  (ZYRTEC) 10 MG tablet TAKE 1 TABLET BY MOUTH EVERY DAY IN THE MORNING    risedronate (ACTONEL) 150 MG tablet Take 1 tablet by mouth every 30 (thirty) days with full glass of water on empty stomach-nothing else by mouth and do not lie down for 1/2 hour    hydrocortisone 2.5 % cream Apply topically 2 (two) times daily To affected areas of scalp and ears    VITAMIN D3 50 MCG (2000 UT) TABS tablet TAKE 1 TABLET BY MOUTH EVERY DAY IN THE MORNING    atorvastatin (LIPITOR) 80 MG tablet Take 1 tablet by mouth Daily after dinner    polyethylene glycol (GLYCOLAX/MIRALAX) 17 g packet Take 1 packet by mouth in the morning.    docusate sodium (COLACE) 100 MG capsule Take 1 capsule by mouth in the morning and 1 capsule before bedtime.    aspirin 81 MG chewable tablet Take 1 tablet by mouth in the morning.    bisacodyl (DULCOLAX) 5 MG EC tablet Take 8 tablets by mouth See Admin Instructions Take as directed prior to colonoscopy    albuterol HFA 108 (90 Base) MCG/ACT inhaler INHALE 2 PUFFS INTO THE LUNGS EVERY 6 HOURS AS NEEDED FOR WHEEZING OR SHORTNESS OF BREATH    acetaminophen (TYLENOL) 500 MG tablet Take 500 mg by mouth every 6 (six) hours as needed for Pain    ipratropium-albuterol (DUO-NEB) 0.5-2.5 (3) MG/3ML SOLN Inhalation Solution Take 3 mLs by nebulization 4 (four) times daily.     No current facility-administered medications for this encounter.         (Not in a hospital admission)      PAT Assessment    Airways symptoms: DENTURES/PARTIAL:  Yes    Activity tolerance (How many flights of stairs): slow due to [employer] Device: Cane           PAT Screening     Row Name 02/26/23 1009       Integumentary - Complete full head to toe skin assessment    Integumentary (WDL) WDL    Row Name 02/26/23 1008       STOP-Bang Questionaire     Do you snore loudly (loud enough to be heard through closed doors or   your bed-partner elbows you for snoring at night? 1    Do you often feel Tired, fatigued, or Sleepy during the daytime  (such as   falling asleep when driving)? 0    Has anyone observed you stop breathing, or choking/gasping during your   sleep? 0    Do you have, or are being treated for, High Blood Pressure? 0    Height 5\' 1"  (1.549 m)    Weight 89.8 kg (198 lb)    BMI (Calculated) 37.49    Neck size large? 0    Stop-Bang Score Intermediate                       02/26/23  1008   Weight: 89.8 kg (198 lb)   Height: 5\' 1"  (1.549 m)           Patient issues currently are  pre op and all questions answered . Pt needs appt with np for anesthesia and has to be cleared by PCP and then determine if she needs cardiac clearance. .  Medication instructions  and  AVS  reviewed with  patient.   CHX wipes instructions were given and reviewed.       Dayton Martes, RN

## 2023-02-26 NOTE — Discharge Instructions (Addendum)
SURGICAL DAY CARE PRE-OPERATIVE INSTRUCTIONS    DAY OF SURGERY    Arrive at: [facility] Registration on Wednesday, November  13 at (Time): office will call day before with arrival time .     You MUST have a responsible adult available to accompany you home after surgery.  You cannot take a taxi, [place] or Lyft home alone unless accompanied by another adult.  (We suggest you that you have someone available to assist you at home after your surgery)     INSTRUCTIONS:     You may have nothing to eat after midnight the night before your surgery.    Please drink 32 oz (1 L) of electrolyte rich fluid in the 12 hours leading up to surgery, to end by 2 hours before the start time of surgery (in addition to the ERAS protocol).  You may drink clear liquids up until two hours before your arrival time.  Examples of clear liquids are:  water, clear juice such as apple juice including orange juice with no pulp  electrolyte drinks such as Gatorade, Gatorade Zero, Pedialyte, or Vitamin water  one cup of Black coffee or tea with NO CREAM OR MILK    Do not smoke or vape the morning of your surgery     Please leave all valuables at home, including jewelry, watches, money, etc.    Please remove all fingernail polish and do not wear any makeup     If you are having eye surgery, do not wear any eye or face makeup and avoid facial moisturizers and perfumes.    Please take out any hair extensions that can be removed.    Do not shave your surgical site.    Do not wear contact lenses.    MEDICATIONS:     Take the following medication the night before surgery at bedtime:  usual meds     Take the following medication the morning of your surgery with only a sip of water: CONSUME THE HIGH CARB DRINK ON THE WAY TO THE HOSPITAL and take these medications .inhaler  and bring with you and take suboxone , pepsid, vesicare, wellbutrin, aspirin per SURGEON AS YOU STATED

## 2023-02-26 NOTE — Telephone Encounter (Signed)
Contacted the following pharmacy:     [id number] - [place], [place] - [address]  Phone: [phone] Fax: [phone]    Confirmed buspirone was cancelled by the pharmacist.

## 2023-02-27 ENCOUNTER — Ambulatory Visit (HOSPITAL_BASED_OUTPATIENT_CLINIC_OR_DEPARTMENT_OTHER): Payer: No Typology Code available for payment source | Admitting: Specialist
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
## 2023-03-03 ENCOUNTER — Telehealth (HOSPITAL_BASED_OUTPATIENT_CLINIC_OR_DEPARTMENT_OTHER): Payer: Self-pay | Admitting: Licensed Practical Nurse

## 2023-03-03 NOTE — Telephone Encounter (Signed)
Call placed to [facility] and spoke to [place], they are unable accept pt, they don't take her insurance

## 2023-03-03 NOTE — Telephone Encounter (Signed)
Called placed [facility] [place], they don't take [facility].

## 2023-03-03 NOTE — Telephone Encounter (Signed)
Called and spoke to [place] at [facility], they do not take [employer] either.  Will try another company

## 2023-03-03 NOTE — Telephone Encounter (Signed)
External referral faxed to [facility]

## 2023-03-03 NOTE — Telephone Encounter (Signed)
Referral faxed to [facility]. Awaiting on confirmation

## 2023-03-04 NOTE — Telephone Encounter (Signed)
[facility]NA Referral Diagnosis:   Rt TKA  DOS  03/11/23     Outreach Attempt:  4    Was Referral to [facility] Completed:  no    [facility]NA Service start date:  11/15    [facility] Provider:  [facility]    Orthopedic Attending:  Dr. Sallee Lange    PA notified on:   Cherlyn Roberts PA    VNA  Denied,  they do not take pts INS

## 2023-03-05 ENCOUNTER — Ambulatory Visit: Payer: No Typology Code available for payment source | Attending: Internal Medicine | Admitting: Physician Assistant

## 2023-03-05 ENCOUNTER — Other Ambulatory Visit: Payer: Self-pay

## 2023-03-05 VITALS — BP 129/77 | HR 77 | Temp 96.6°F | Wt 194.0 lb

## 2023-03-05 DIAGNOSIS — Z01818 Encounter for other preprocedural examination: Secondary | ICD-10-CM | POA: Insufficient documentation

## 2023-03-05 DIAGNOSIS — M1711 Unilateral primary osteoarthritis, right knee: Secondary | ICD-10-CM | POA: Diagnosis present

## 2023-03-05 DIAGNOSIS — I10 Essential (primary) hypertension: Secondary | ICD-10-CM | POA: Diagnosis present

## 2023-03-05 DIAGNOSIS — J449 Chronic obstructive pulmonary disease, unspecified: Secondary | ICD-10-CM | POA: Insufficient documentation

## 2023-03-05 DIAGNOSIS — I6330 Cerebral infarction due to thrombosis of unspecified cerebral artery: Secondary | ICD-10-CM | POA: Diagnosis present

## 2023-03-05 DIAGNOSIS — R7303 Prediabetes: Secondary | ICD-10-CM | POA: Diagnosis present

## 2023-03-05 LAB — EKG

## 2023-03-05 NOTE — Telephone Encounter (Signed)
Call placed to [facility] and spoke to [place] who will have  Darl Pikes , the case manager call back with the name of the [facility] that we can refer patient to.  Direct line provided.

## 2023-03-05 NOTE — Telephone Encounter (Signed)
[facility] is unable to accept patient due to staffing issue.

## 2023-03-05 NOTE — Telephone Encounter (Signed)
Referral faxed to [facility].  Confirmed receipt with [place]. They are working on it and will call back to confirm if the accept her.

## 2023-03-05 NOTE — Telephone Encounter (Signed)
Darl Pikes called back and advised to send referral to  [facility] or [facility].

## 2023-03-05 NOTE — Progress Notes (Signed)
Cheryl Dixon is a 67 year old female pt of Dr. Hildred Laser, MD who presents for preop exam      Preop exam  Right TKA planned for next week  Activity limited by knee pain but tries to do as much as she can  Rides stationary bike at home for 10 minutes at a time several times during the day  Does laundry, light cleaning around the house  Has varicose veins or notes they seem swollen at times  Using Stiolto as prescribed for COPD, reports breathing has been very good with this  History of stroke last year, recently was not taking aspirin as prescribed but has just restarted  Denies chest pain, shortness of breath, orthopnea, PND, easy bruising or bleeding, adverse reaction anesthesia      HEMOGLOBIN A1C (%)   Date Value   08/20/2022 5.6   12/12/2021 5.7 (H)   01/12/2019 4.6         BP 129/77   Pulse 77   Temp 96.6 F (35.9 C) (Temporal)   Wt 88 kg (194 lb)   LMP 11/20/1991   SpO2 96%   BMI 36.66 kg/m   Pain Score: Data Unavailable            Physical Exam  Vitals reviewed.   Constitutional:       General: She is not in acute distress.     Appearance: Normal appearance.   Cardiovascular:      Rate and Rhythm: Normal rate and regular rhythm.      Heart sounds: Normal heart sounds. No murmur heard.  Pulmonary:      Effort: Pulmonary effort is normal. No respiratory distress.      Breath sounds: Normal breath sounds. No wheezing, rhonchi or rales.   Musculoskeletal:      Right lower leg: No edema.      Left lower leg: No edema.   Skin:     Comments: Varicose veins left leg   Neurological:      Mental Status: She is alert.         EKG: By my read NSR, no sign of ischemia      ASSESSMENT/PLAN        (Z01.818) Preoperative examination  (primary encounter diagnosis)  (M17.11) Primary osteoarthritis of right knee  (I63.30) Cerebrovascular accident (CVA) due to thrombosis of cerebral artery (HCC)  (J44.9) Chronic obstructive pulmonary disease, unspecified COPD type (HCC)  (I10) Essential hypertension  (R73.03)  Prediabetes  Comment: Patient presents for preop exam prior to planned TKA next week.  Functional capacity difficult to assess given knee pain, but seems capable of about 4 METS currently and I suspect would be higher if knee pain was improved.  EKG today with no concerning findings, and she has no symptoms to suggest angina.  Reports she is breathing well and lungs are clear, COPD appears well-controlled.  Blood pressure is very good and A1c 6 months ago was normal.  She did have a stroke last year, but has essentially no residual effects from this.  She still has not completed echo with bubble study to finish workup for stroke.  However, I think she could proceed with surgery without doing echo.  There is no current sign of heart failure on her exam, nor there are any symptoms of this or of ischemic heart disease.  Bubble study was ordered to assess for PFO, but even if she were to have PFO it could be an innocent bystander, as she does  have other risk factors for stroke.  She will be having surgery which increases thromboembolic risk, but she will be anticoagulated afterwards so it does not seem that PFO would increase risk for stroke.  Per the revised cardiac risk index she has 1 risk factor being the history of stroke, meaning she has a 1% risk of cardiac death, nonfatal MI, and nonfatal cardiac arrest.  I think she could proceed with surgery without any further testing.  Plan: EKG           Patient reports she was advised she could continue aspirin for her surgery, will send message to Ortho to confirm this - would like to continue if possible          We discussed the patient's current medications. The patient expressed understanding and no barriers to adherence were identified.   1. The patient indicates understanding of these issues and agrees with the plan. Brief care plan is updated and reviewed with the patient.   2. The patient is given an After Visit Summary sheet that lists all medications with  directions, allergies, orders placed during this encounter, and follow-up instructions.   3. I reviewed the patient's medical information and medical history   4. I reconciled the patient's medication list and prepared and supplied needed refills.   5. I have reviewed the past medical, family, and social history sections including the medications and allergies.

## 2023-03-05 NOTE — Telephone Encounter (Signed)
[facility] is unable to accept the referral due to staffing issue.  Their physical therapist is going on vacation and wont' be back  until 03/20/23       Will send fax the referral to [facility].

## 2023-03-06 ENCOUNTER — Encounter (HOSPITAL_BASED_OUTPATIENT_CLINIC_OR_DEPARTMENT_OTHER): Payer: Self-pay | Admitting: Physician Assistant

## 2023-03-09 ENCOUNTER — Encounter (HOSPITAL_BASED_OUTPATIENT_CLINIC_OR_DEPARTMENT_OTHER): Payer: Self-pay

## 2023-03-09 ENCOUNTER — Inpatient Hospital Stay (HOSPITAL_BASED_OUTPATIENT_CLINIC_OR_DEPARTMENT_OTHER)
Admit: 2023-03-09 | Discharge: 2023-03-09 | Disposition: A | Discharge status: Home / Self Care | Payer: No Typology Code available for payment source

## 2023-03-09 DIAGNOSIS — F419 Anxiety disorder, unspecified: Secondary | ICD-10-CM

## 2023-03-09 DIAGNOSIS — F1920 Other psychoactive substance dependence, uncomplicated: Secondary | ICD-10-CM

## 2023-03-09 DIAGNOSIS — E669 Obesity, unspecified: Secondary | ICD-10-CM | POA: Insufficient documentation

## 2023-03-09 NOTE — Anesthesia Preprocedure Evaluation (Addendum)
Pre-Anesthetic Note  .      Patient: Cheryl Dixon is a 67 year old female with PMH CVA 12/19/2021, COPD, aortic aneurysm, preDM, OAB, anxiety/depression, HLD, OUD on suboxone who presents for TELEPHONE SCREEN (per patient request) preanesthesia evaluation prior to Right TKA with Dr Sallee Lange 03/11/2023 under spinal anesthesia w/ block. NO previous issues with anesthesia reported. Seen by pcp 03/05/2023 for preop clearance.     Procedure Information       Date/Time: 03/11/23 0700    Procedure: ARTHROPLASTY, KNEE, TOTAL (Right)    Diagnosis: Primary osteoarthritis of right knee [M17.11]    Pre-op diagnosis: Primary osteoarthritis of right knee [M17.11]    [facility]    Surgeons: Gabriel Rainwater, MD            Relevant Problems   PULMONARY   (+) Chronic obstructive pulmonary disease (HCC)      NEURO/PSYCH   (+) Cerebrovascular accident (CVA) (HCC)      CARDIO   (+) Aneurysm of ascending aorta without rupture (HCC)   (+) Essential hypertension           Previous Anesthetic History:   Past Surgical History:  2023: COLONOSCOPY  No date: ENDOMETRIAL ABLTJ THERMAL W/O HYSTEROSCOPIC GID  No date: PR ANES HRNA REPAIR UPR ABD TABDL RPR DIPHRG HRNA  No date: PR ANES IPER LOWER ABD W/LAPS RAD HYSTERECTOMY  No date: RPR 1ST INGUN HRNA PRETERM INFT [facility]        Medications  Current Outpatient Medications   Medication Sig    tiotropium-olodaterol (STIOLTO RESPIMAT) 2.5-2.5 MCG/ACT inhal Inhale 2 puffs into the lungs daily    famotidine (PEPCID) 20 MG tablet TAKE 1 TABLET BY MOUTH IN THE MORNING AND BEFORE BEDTIME    buprenorphine-naloxone (SUBOXONE) 8-2 MG sublingual film Take 2.5 films under the tongue daily.    solifenacin (VESICARE) 10 MG tablet Take 1 tablet by mouth in the morning.    naproxen (NAPROSYN) 500 MG tablet TAKE 1 TABLET BY MOUTH IN THE MORNING AND IN THE EVENING WITH MEALS    buPROPion (WELLBUTRIN SR) 200 MG 12 hr tablet TAKE 1 TABLET BY MOUTH IN THE MORNING AND BEFORE BEDTIME    cetirizine (ZYRTEC) 10  MG tablet TAKE 1 TABLET BY MOUTH EVERY DAY IN THE MORNING    risedronate (ACTONEL) 150 MG tablet Take 1 tablet by mouth every 30 (thirty) days with full glass of water on empty stomach-nothing else by mouth and do not lie down for 1/2 hour    hydrocortisone 2.5 % cream Apply topically 2 (two) times daily To affected areas of scalp and ears    VITAMIN D3 50 MCG (2000 UT) TABS tablet TAKE 1 TABLET BY MOUTH EVERY DAY IN THE MORNING    atorvastatin (LIPITOR) 80 MG tablet Take 1 tablet by mouth Daily after dinner    polyethylene glycol (GLYCOLAX/MIRALAX) 17 g packet Take 1 packet by mouth in the morning.    docusate sodium (COLACE) 100 MG capsule Take 1 capsule by mouth in the morning and 1 capsule before bedtime.    aspirin 81 MG chewable tablet Take 1 tablet by mouth in the morning.    bisacodyl (DULCOLAX) 5 MG EC tablet Take 8 tablets by mouth See Admin Instructions Take as directed prior to colonoscopy    albuterol HFA 108 (90 Base) MCG/ACT inhaler INHALE 2 PUFFS INTO THE LUNGS EVERY 6 HOURS AS NEEDED FOR WHEEZING OR SHORTNESS OF BREATH    acetaminophen (  TYLENOL) 500 MG tablet Take 500 mg by mouth every 6 (six) hours as needed for Pain    ipratropium-albuterol (DUO-NEB) 0.5-2.5 (3) MG/3ML SOLN Inhalation Solution Take 3 mLs by nebulization 4 (four) times daily.     No current facility-administered medications for this encounter.         Allergies:   Review of Patient's Allergies indicates:   Darvon                     Meperidine hcl              Comment:Nausea/vomit   Paxil [paroxetine]      Rash   Zoloft [sertraline *    Rash    Smoking, Alcohol, Drugs:  Social History    Tobacco Use      Smoking status: Former        Packs/day: 0.00        Years: 1 pack/day for 32.0 years (32.0 ttl pk-yrs)        Types: Cigarettes        Start date: 09/26/1989        Quit date: 09/26/2021        Years since quitting: 1.4      Smokeless tobacco: Never      Tobacco comments: quit 1980-1996, then light until 2003, then 1 ppd since     Alcohol use: No    Drug use:         Types:   Marijuana      Comment:   past opioids, nasal heroin, 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  MJ 1x per week          PMHx:  Past Medical History:  No date: Anxiety  No date: Arthritis  No date: COPD (chronic obstructive pulmonary disease) (HCC)  No date: Depression  No date: Obese  No date: Substance addiction (HCC)  No date: Varicose veins of lower extremities    Vitals  LMP 11/20/1991     Review of Systems     Patient summary reviewed and Nursing notes reviewed      Anesthetic History:   negative anesthesia history ROS           Cardiovascular:  Positive for hypercholesterolemia and hypertension. Negative for chest pain and palpitations.   Physical Activity in METs greater than 4 (stationary bike 10 min several times daily, laundry, light housecleaning)    Pulmonary: Positive for COPD (never hospitalized) and tobacco use (quit 2023). Negative for obstructive sleep apnea and recent URI.   GU/Renal:  Positive for frequency (OAB). Negative for kidney disease.   Hepatic: Skin negative for hepatic disease.    Neurological:  Positive for gait problem (cane, no recent falls) and strokes (12/19/2021). Negative for headaches, seizures, mononeuropathy and dizziness. no mononeuropathy  Gastrointestinal:  Positive for constipation (well managed on meds) and GERD.   Hematological: Negative for DVT, pulmonary embolism and bleeding disorder.        Had transfusion reaction (fever) 30+ yrs ago post op hernia repair    Endocrine: Positive for Diabetes (preDM HgA1c 5.6 08/20/22).   Head:  Negative for neck stiffness.   Eyes:  Negative for glaucoma.   Ears:  Negative for hearing loss.   Throat/Mouth:  Positive for sore throat (dry mouth assoc with inhalers). Negative for dental problems (upper full) and trouble swallowing.   Musculoskeletal:  Positive for arthralgias (Right knee OA, left shoulder pain sec to cane use-pt is left handed).   Psychiatric:  Positive  for depression, anxiety, substance use,  marijuana use (edibles 20mg  at HS 2-3 x/week) and opiate use (on suboxone Dr Berneice Gandy Miller/Roll, last use opiates, nasal heroin 65yrs).    Constitutional: Negative for constitutional diseases.  Negative for unexpected weight change.   Skin: Negative for skin diseases.        Physical Exam:     General     Level of consciousness:  Alert and oriented (time, person, place)     BMI   BMI greater than 30 kg/m2      Airway     Mallampati:  I    TM distance:  >3 FB    Mouth opening:  >3 FB    Neck ROM:  Full     Teeth    (+) upper dentures    }     Heart  - normal exam       Lungs - normal exam  Breath sounds clear to auscultation          HEENT: normal exam  head/ face normal     Abdomen: normal exam    soft  bowel sounds normal       Other findings: TELEPHONE SCREEN ONLY                        Pertinent Labs:   Lab Results   Component Value Date    NA 141 08/20/2022    K 4.9 08/20/2022    CREAT 0.9 08/20/2022    GLUCOSER 80 08/20/2022    WBC 6.3 08/20/2022    HCT 43.9 08/20/2022    PLTA 249 08/20/2022    PT 12.2 12/12/2021    APTT 26.1 02/16/2007    INR 1.1 12/12/2021         Anesthesia Plan    ASA Score:     ASA:  3    Airway:      Mallampati:  I    Mouth opening:  >3 FB    Neck ROM:  Full    TM distance:  >3 FB     Plan: spinal, regional and TIVA    Other information:     EKG Reviewed: : Yes  03/05/2023  2:16 PM - Interface,Cpvl    Result Report    EKG REPORT  Test Reason : Preop       Vent. Rate :  66 BPM     Atrial Rate :  66 BPM       P-R Int : 132 ms          QRS Dur :  86 ms        QT Int : 400 ms       P-R-T Axes :  61  29  65 degrees      QTcB Int : 419 ms         Normal sinus rhythm    Minimal voltage criteria for LVH, may be normal variant ( [employer] product )    Borderline ECG    When compared with ECG of 08-Dec-2022 21:08,    No significant change was found         Referred By: Lamona Curl            Confirmed By: Janace Aris          Full Stomach Precaution:: No      Post-Plan::   Floor    Anesthesia Assessment and Plan:  Right knee OA-Plan for Right TKA with Dr Sallee Lange 03/11/2023 under spinal anesthesia w/ block. NO previous issues with anesthesia reported.    CVA 12/19/2021-Per pcp note 03/05/2023 Dr Roll/Arleigh Geraldo Pitter PA-C-She still has not completed echo with bubble study to finish workup for stroke.  However, I think she could proceed with surgery without doing echo.  There is no current sign of heart failure on her exam, nor there are any symptoms of this or of ischemic heart disease.  Bubble study was ordered to assess for PFO, but even if she were to have PFO it could be an innocent bystander, as she does have other risk factors for stroke.  She will be having surgery which increases thromboembolic risk, but she will be anticoagulated afterwards so it does not seem that PFO would increase risk for stroke.  Per the revised cardiac risk index she has 1 risk factor being the history of stroke, meaning she has a 1% risk of cardiac death, nonfatal MI, and nonfatal cardiac arrest.  I think she could proceed with surgery without any further testing. Continue asa, statin.     COPD-Stable continue inhalers, nebulizers.     OUD-on suboxone, prescriber Dr Berneice Gandy Miller/Roll    OAB-continue solifenacin    Anxiety/depression-stable continue wellbutrin    GERD-continue pepcid  Plan discussed with attending anesthesiologist:     Rod Mae, MD  Informed Consent:     Anesthetic plan and risks discussed with:  Patient   Patient Consented        Surgical Assessment and Plan:     Discuss anesthesia plan with patient: Spinal, iPACK and Adductor Canal blocks with TIVA.  Risks and benefits discussed, patient consents.

## 2023-03-10 ENCOUNTER — Ambulatory Visit: Payer: No Typology Code available for payment source | Attending: Family Medicine | Admitting: Family Medicine

## 2023-03-10 ENCOUNTER — Other Ambulatory Visit (HOSPITAL_BASED_OUTPATIENT_CLINIC_OR_DEPARTMENT_OTHER): Payer: Self-pay | Admitting: Physician Assistant

## 2023-03-10 ENCOUNTER — Ambulatory Visit: Payer: No Typology Code available for payment source | Attending: Psychosomatic Medicine | Admitting: Clinical

## 2023-03-10 DIAGNOSIS — F1120 Opioid dependence, uncomplicated: Secondary | ICD-10-CM | POA: Diagnosis present

## 2023-03-10 DIAGNOSIS — F3341 Major depressive disorder, recurrent, in partial remission: Secondary | ICD-10-CM | POA: Diagnosis present

## 2023-03-10 DIAGNOSIS — M1711 Unilateral primary osteoarthritis, right knee: Secondary | ICD-10-CM

## 2023-03-10 MED ORDER — BUPRENORPHINE HCL-NALOXONE HCL 8-2 MG SL FILM
ORAL_FILM | SUBLINGUAL | 0 refills | Status: DC
Start: 2023-03-10 — End: 2023-04-01

## 2023-03-10 NOTE — Progress Notes (Signed)
Cheryl Dixon is a 67 year old female seen in follow up for opioid use disorder:     Buprenorphine dose: 2.5  tabs daily (x8mg )  Response, adequacy of dose: good  Relapses/close calls: No  Meetings: attends every 4+ week  Non-prescribed substance use: None reported        Social history/events:  none  Additional psychiatric or medical care/events: preparing for a knee replacement, worried about her cardiac health     Present Medications:    famotidine (PEPCID) 20 MG tablet, TAKE 1 TABLET BY MOUTH IN THE MORNING AND BEFORE BEDTIME, Disp: 180 tablet, Rfl: 0     estrogen, conjugated, (PREMARIN) 0.625 MG/GM vaginal cream, Place 0.5 grams vaginally in the morning., Disp: 30 g, Rfl: 0  solifenacin (VESICARE) 10 MG tablet, Take 1 tablet by mouth in the morning., Disp: 90 tablet, Rfl: 0  naproxen (NAPROSYN) 500 MG tablet, TAKE 1 TABLET BY MOUTH IN THE MORNING AND IN THE EVENING WITH MEALS, Disp: 60 tablet, Rfl: 2  buPROPion (WELLBUTRIN SR) 200 MG 12 hr tablet, TAKE 1 TABLET BY MOUTH IN THE MORNING AND BEFORE BEDTIME, Disp: 180 tablet, Rfl: 3  buprenorphine-naloxone (SUBOXONE) 8-2 MG sublingual film, Take 2.5 films under the tongue daily., Disp: 70 Film, Rfl: 0  cetirizine (ZYRTEC) 10 MG tablet, TAKE 1 TABLET BY MOUTH EVERY DAY IN THE MORNING, Disp: 90 tablet, Rfl: 3  busPIRone (BUSPAR) 5 MG tablet, TAKE 1 TABLET BY MOUTH IN THE MORNING AND BEFORE BEDTIME, Disp: 180 tablet, Rfl: 1  [DISCONTINUED] famotidine (PEPCID) 20 MG tablet, Take 1 tablet by mouth in the morning and 1 tablet before bedtime., Disp: 60 tablet, Rfl: 2  risedronate (ACTONEL) 150 MG tablet, Take 1 tablet by mouth every 30 (thirty) days with full glass of water on empty stomach-nothing else by mouth and do not lie down for 1/2 hour, Disp: 3 tablet, Rfl: 3  tiotropium-olodaterol (STIOLTO RESPIMAT) 2.5-2.5 MCG/ACT inhal, Inhale 2 puffs into the lungs in the morning., Disp: 3 each, Rfl: 3  hydrocortisone 2.5 % cream, Apply topically 2 (two) times daily To  affected areas of scalp and ears, Disp: 60 g, Rfl: 1  VITAMIN D3 50 MCG (2000 UT) TABS tablet, TAKE 1 TABLET BY MOUTH EVERY DAY IN THE MORNING, Disp: 90 tablet, Rfl: 3  atorvastatin (LIPITOR) 80 MG tablet, Take 1 tablet by mouth Daily after dinner, Disp: 90 tablet, Rfl: 3  polyethylene glycol (GLYCOLAX/MIRALAX) 17 g packet, Take 1 packet by mouth in the morning., Disp: 90 packet, Rfl: 3  docusate sodium (COLACE) 100 MG capsule, Take 1 capsule by mouth in the morning and 1 capsule before bedtime., Disp: 180 capsule, Rfl: 3  aspirin 81 MG chewable tablet, Take 1 tablet by mouth in the morning., Disp: 90 tablet, Rfl: 3  bisacodyl (DULCOLAX) 5 MG EC tablet, Take 8 tablets by mouth See Admin Instructions Take as directed prior to colonoscopy, Disp: 8 tablet, Rfl: 0  albuterol HFA 108 (90 Base) MCG/ACT inhaler, INHALE 2 PUFFS INTO THE LUNGS EVERY 6 HOURS AS NEEDED FOR WHEEZING OR SHORTNESS OF BREATH, Disp: 8.5 g, Rfl: 11  acetaminophen (TYLENOL) 500 MG tablet, Take 500 mg by mouth every 6 (six) hours as needed for Pain, Disp: , Rfl:   ipratropium-albuterol (DUO-NEB) 0.5-2.5 (3) MG/3ML SOLN Inhalation Solution, Take 3 mLs by nebulization 4 (four) times daily., Disp: 180 mL, Rfl: 0     No current facility-administered medications on file prior to visit.         Last  UDS:  AMPHETAMINES URINE (ng/mL)   Date Value   10/27/2022 NEGATIVE     COCAINE METABOLITES URINE (ng/mL)   Date Value   10/27/2022 NEGATIVE     OPIATES URINE (ng/mL)   Date Value   10/27/2022 NEGATIVE     BENZODIAZEPINES URINE (ng/mL)   Date Value   10/27/2022 NEGATIVE     CANNABINOIDS URINE (ng/mL)   Date Value   10/27/2022 POSITIVE (A)     ETHANOL URINE (mg/dL)   Date Value   07/37/1062 NEGATIVE       Review of Systems: no notable symptoms     PHYSICAL EXAMINATION: by video  General appearance - healthy female in no distress  Eyes - pupils normal  Skin - warm and dry  Neuro - nonfocal  Affect - normal     Problem List Items Addressed This Visit                   Mental Health     Opioid dependence on agonist therapy (HCC)                    Today in group we discussed resilience. Shauntea Kady contributed their experiences.     Assessment:  Opioid use disorder  Comment: the patient actively participated and shared experiences, interacting appropriately with group without barriers to understanding  Plan:  continue current regimen with scheduled follow up.  Patient is counseled regarding relapse prevention, involvement in recovery groups, and potential side effects of buprenorphine.     Horatio Pel, MD

## 2023-03-11 ENCOUNTER — Other Ambulatory Visit: Payer: Self-pay

## 2023-03-11 ENCOUNTER — Inpatient Hospital Stay (HOSPITAL_BASED_OUTPATIENT_CLINIC_OR_DEPARTMENT_OTHER): Payer: No Typology Code available for payment source

## 2023-03-11 ENCOUNTER — Inpatient Hospital Stay
Admission: RE | Admit: 2023-03-11 | Discharge: 2023-03-12 | DRG: 470 | Disposition: A | Discharge status: Home Health | Payer: No Typology Code available for payment source | Attending: Specialist | Admitting: Specialist

## 2023-03-11 ENCOUNTER — Inpatient Hospital Stay (HOSPITAL_BASED_OUTPATIENT_CLINIC_OR_DEPARTMENT_OTHER): Payer: Self-pay | Admitting: Anesthesiology

## 2023-03-11 ENCOUNTER — Encounter (HOSPITAL_BASED_OUTPATIENT_CLINIC_OR_DEPARTMENT_OTHER): Payer: Self-pay | Admitting: Specialist

## 2023-03-11 ENCOUNTER — Encounter (HOSPITAL_BASED_OUTPATIENT_CLINIC_OR_DEPARTMENT_OTHER)
Admission: RE | Disposition: A | Discharge status: Home Health | Payer: Self-pay | Source: Ambulatory Visit | Attending: Specialist

## 2023-03-11 ENCOUNTER — Inpatient Hospital Stay (HOSPITAL_BASED_OUTPATIENT_CLINIC_OR_DEPARTMENT_OTHER): Payer: No Typology Code available for payment source | Admitting: Anesthesiology

## 2023-03-11 ENCOUNTER — Other Ambulatory Visit (HOSPITAL_BASED_OUTPATIENT_CLINIC_OR_DEPARTMENT_OTHER): Payer: No Typology Code available for payment source

## 2023-03-11 DIAGNOSIS — M179 Osteoarthritis of knee, unspecified: Principal | ICD-10-CM | POA: Insufficient documentation

## 2023-03-11 DIAGNOSIS — I714 Abdominal aortic aneurysm, without rupture, unspecified: Secondary | ICD-10-CM | POA: Diagnosis present

## 2023-03-11 DIAGNOSIS — F1120 Opioid dependence, uncomplicated: Secondary | ICD-10-CM | POA: Diagnosis present

## 2023-03-11 DIAGNOSIS — Z8673 Personal history of transient ischemic attack (TIA), and cerebral infarction without residual deficits: Secondary | ICD-10-CM | POA: Diagnosis not present

## 2023-03-11 DIAGNOSIS — Z471 Aftercare following joint replacement surgery: Secondary | ICD-10-CM | POA: Diagnosis not present

## 2023-03-11 DIAGNOSIS — J449 Chronic obstructive pulmonary disease, unspecified: Secondary | ICD-10-CM | POA: Diagnosis present

## 2023-03-11 DIAGNOSIS — I10 Essential (primary) hypertension: Secondary | ICD-10-CM | POA: Diagnosis not present

## 2023-03-11 DIAGNOSIS — M1711 Unilateral primary osteoarthritis, right knee: Secondary | ICD-10-CM | POA: Diagnosis present

## 2023-03-11 DIAGNOSIS — Z96651 Presence of right artificial knee joint: Secondary | ICD-10-CM | POA: Diagnosis present

## 2023-03-11 LAB — COMPREHENSIVE METABOLIC PANEL
ALANINE AMINOTRANSFERASE: 9 U/L — ABNORMAL LOW (ref 12–45)
ALBUMIN: 4 g/dL (ref 3.4–5.2)
ALKALINE PHOSPHATASE: 59 U/L (ref 45–117)
ANION GAP: 11 mmol/L (ref 10–22)
ASPARTATE AMINOTRANSFERASE: 15 U/L (ref 8–34)
BILIRUBIN TOTAL: 0.4 mg/dL (ref 0.2–1.0)
BUN (UREA NITROGEN): 16 mg/dL (ref 7–18)
CALCIUM: 8.7 mg/dL (ref 8.5–10.5)
CARBON DIOXIDE: 25 mmol/L (ref 21–32)
CHLORIDE: 105 mmol/L (ref 98–107)
CREATININE: 0.8 mg/dL (ref 0.4–1.2)
ESTIMATED GLOMERULAR FILT RATE: >60 mL/min (ref >60)
Glucose Random: 115 mg/dL (ref 74–160)
POTASSIUM: 4 mmol/L (ref 3.5–5.1)
SODIUM: 141 mmol/L (ref 136–145)
TOTAL PROTEIN: 6.2 g/dL — ABNORMAL LOW (ref 6.4–8.2)

## 2023-03-11 LAB — TYPE AND SCREEN
ABO/RH INTERPRETATION: A POS
ANTIBODY SCREEN SOLID PHASE: NEGATIVE

## 2023-03-11 LAB — CBC, PLATELET & DIFFERENTIAL
ABSOLUTE BASO COUNT: 0.1 10*3/uL (ref 0.0–0.1)
ABSOLUTE EOSINOPHIL COUNT: 0.2 10*3/uL (ref 0.0–0.8)
ABSOLUTE IMM GRAN COUNT: 0.07 10*3/uL (ref 0.00–0.10)
ABSOLUTE LYMPH COUNT: 1 10*3/uL (ref 0.6–5.9)
ABSOLUTE MONO COUNT: 0.6 10*3/uL (ref 0.2–1.4)
ABSOLUTE NEUTROPHIL COUNT: 5.8 10*3/uL (ref 1.6–8.3)
ABSOLUTE NRBC COUNT: 0 10*3/uL (ref 0.0–0.0)
BASOPHIL %: 0.9 % (ref 0.0–1.2)
EOSINOPHIL %: 2.1 % (ref 0.0–7.0)
HEMATOCRIT: 39.5 % (ref 34.1–44.9)
HEMOGLOBIN: 12.7 g/dL (ref 11.2–15.7)
IMMATURE GRANULOCYTE %: 0.9 % (ref 0.0–1.0)
LYMPHOCYTE %: 12.9 % — ABNORMAL LOW (ref 15.0–54.0)
MEAN CORP HGB CONC: 32.2 g/dL (ref 31.0–37.0)
MEAN CORPUSCULAR HGB: 28.3 pg (ref 26.0–34.0)
MEAN CORPUSCULAR VOL: 88.2 fL (ref 80.0–100.0)
MEAN PLATELET VOLUME: 9.8 fL (ref 8.7–12.5)
MONOCYTE %: 8 % (ref 4.0–13.0)
NEUTROPHIL %: 75.2 % — ABNORMAL HIGH (ref 40.0–75.0)
NRBC %: 0 % (ref 0.0–0.0)
PLATELET COUNT: 246 10*3/uL (ref 150–400)
RBC DISTRIBUTION WIDTH STD DEV: 45.2 fL (ref 35.1–46.3)
RED BLOOD CELL COUNT: 4.48 M/uL (ref 3.90–5.20)
WHITE BLOOD CELL COUNT: 7.8 10*3/uL (ref 4.0–11.0)

## 2023-03-11 LAB — REPEAT ABORH: PATIENT ABO/RH RETYPE INTERP: A POS

## 2023-03-11 SURGERY — ARTHROPLASTY, KNEE, TOTAL
Anesthesia: Regional | Site: Knee | Laterality: Right | Wound class: Class I/ Clean

## 2023-03-11 MED ORDER — ACETAMINOPHEN 500 MG PO TABS
1000.0000 mg | ORAL_TABLET | Freq: Three times a day (TID) | ORAL | Status: DC
Start: 2023-03-11 — End: 2023-03-12
  Administered 2023-03-11 – 2023-03-12 (×4): 1000 mg via ORAL
  Filled 2023-03-11 (×4): qty 2

## 2023-03-11 MED ORDER — ONDANSETRON 4 MG PO TBDP
4.0000 mg | ORAL_TABLET | Freq: Four times a day (QID) | ORAL | Status: DC | PRN
Start: 2023-03-11 — End: 2023-03-12

## 2023-03-11 MED ORDER — PHENYLEPHRINE HCL 10 MG/ML IJ SOLN (SUPER ERX)
Status: AC
Start: 2023-03-11 — End: 2023-03-11
  Filled 2023-03-11: qty 1

## 2023-03-11 MED ORDER — BUPIVACAINE HCL (PF) 0.5 % IJ SOLN
INTRAMUSCULAR | Status: AC
Start: 2023-03-11 — End: 2023-03-11
  Filled 2023-03-11: qty 10

## 2023-03-11 MED ORDER — PROPOFOL INFUSION
INTRAVENOUS | Status: AC
Start: 2023-03-11 — End: 2023-03-11
  Filled 2023-03-11: qty 50

## 2023-03-11 MED ORDER — DIPHENHYDRAMINE HCL 25 MG PO CAPS
25.0000 mg | ORAL_CAPSULE | Freq: Every evening | ORAL | Status: DC | PRN
Start: 2023-03-11 — End: 2023-03-12
  Administered 2023-03-11: 25 mg via ORAL
  Filled 2023-03-11: qty 1

## 2023-03-11 MED ORDER — ACETAMINOPHEN 325 MG PO TABS
650.0000 mg | ORAL_TABLET | Freq: Once | ORAL | Status: DC | PRN
Start: 2023-03-11 — End: 2023-03-11

## 2023-03-11 MED ORDER — SODIUM CHLORIDE 0.9 % MINI-BAG PLUS
2.0000 g | Freq: Once | INTRAVENOUS | Status: AC
Start: 2023-03-11 — End: 2023-03-11
  Administered 2023-03-11: 2 g via INTRAVENOUS
  Administered 2023-03-11: 1 g via INTRAVENOUS
  Filled 2023-03-11: qty 20

## 2023-03-11 MED ORDER — ONDANSETRON HCL 4 MG/2ML IJ SOLN
INTRAMUSCULAR | Status: AC
Start: 2023-03-11 — End: 2023-03-11
  Filled 2023-03-11: qty 2

## 2023-03-11 MED ORDER — MAGNESIUM HYDROXIDE 400 MG/5ML PO SUSP
15.0000 mL | Freq: Two times a day (BID) | ORAL | Status: DC | PRN
Start: 2023-03-11 — End: 2023-03-12

## 2023-03-11 MED ORDER — SOLIFENACIN SUCCINATE 10 MG PO TABS
10.0000 mg | ORAL_TABLET | Freq: Every day | ORAL | Status: DC
Start: 2023-03-11 — End: 2023-03-12
  Administered 2023-03-11 – 2023-03-12 (×2): 10 mg via ORAL

## 2023-03-11 MED ORDER — HYDROMORPHONE HCL 1 MG/ML IJ SOLN (SUPER ERX)
0.2500 mg | Status: DC | PRN
Start: 2023-03-11 — End: 2023-03-11

## 2023-03-11 MED ORDER — DOCUSATE SODIUM 100 MG PO CAPS
100.0000 mg | ORAL_CAPSULE | Freq: Three times a day (TID) | ORAL | Status: DC
Start: 2023-03-11 — End: 2023-03-12
  Administered 2023-03-11 – 2023-03-12 (×4): 100 mg via ORAL
  Filled 2023-03-11 (×4): qty 1

## 2023-03-11 MED ORDER — PROPOFOL 200 MG/20ML IV EMUL
Freq: Once | INTRAVENOUS | Status: DC | PRN
Start: 2023-03-11 — End: 2023-03-11
  Administered 2023-03-11: 50 mg via INTRAVENOUS

## 2023-03-11 MED ORDER — SENNOSIDES 8.6 MG PO TABS
17.2000 mg | ORAL_TABLET | Freq: Two times a day (BID) | ORAL | Status: DC
Start: 2023-03-11 — End: 2023-03-12
  Administered 2023-03-11 – 2023-03-12 (×2): 17.2 mg via ORAL
  Filled 2023-03-11 (×2): qty 2

## 2023-03-11 MED ORDER — BUPROPION HCL ER (SR) 100 MG PO TB12
200.0000 mg | ORAL_TABLET | Freq: Two times a day (BID) | ORAL | Status: DC
Start: 2023-03-11 — End: 2023-03-12
  Administered 2023-03-11 – 2023-03-12 (×2): 200 mg via ORAL
  Filled 2023-03-11 (×2): qty 2

## 2023-03-11 MED ORDER — SODIUM CHLORIDE 0.9 % IV BOLUS
INTRAVENOUS | Status: DC | PRN
Start: 2023-03-11 — End: 2023-03-11

## 2023-03-11 MED ORDER — GLYCOPYRROLATE 1 MG/5ML IV SOSY (SUPER ERX)
PREFILLED_SYRINGE | Freq: Once | Status: DC | PRN
Start: 2023-03-11 — End: 2023-03-11
  Administered 2023-03-11: .4 mg via INTRAVENOUS

## 2023-03-11 MED ORDER — ROPIVACAINE HCL 5 MG/ML IJ SOLN
INTRAMUSCULAR | Status: AC
Start: 2023-03-11 — End: 2023-03-11
  Filled 2023-03-11: qty 30

## 2023-03-11 MED ORDER — METOCLOPRAMIDE HCL 5 MG/ML IJ SOLN
Freq: Once | INTRAMUSCULAR | Status: DC | PRN
Start: 2023-03-11 — End: 2023-03-11
  Administered 2023-03-11: 10 mg via INTRAVENOUS

## 2023-03-11 MED ORDER — KETAMINE HCL 10 MG/ML IJ SOLN
Freq: Once | INTRAMUSCULAR | Status: DC | PRN
Start: 2023-03-11 — End: 2023-03-11
  Administered 2023-03-11: 30 mg via INTRAVENOUS
  Administered 2023-03-11 (×3): 20 mg via INTRAVENOUS

## 2023-03-11 MED ORDER — GLYCOPYRROLATE 1 MG/5ML IV SOSY (SUPER ERX)
PREFILLED_SYRINGE | Status: AC
Start: 2023-03-11 — End: 2023-03-11
  Filled 2023-03-11: qty 5

## 2023-03-11 MED ORDER — FENTANYL CITRATE 0.05 MG/ML IJ SOLN
INTRAMUSCULAR | Status: AC
Start: 2023-03-11 — End: 2023-03-11
  Filled 2023-03-11: qty 2

## 2023-03-11 MED ORDER — CELECOXIB 100 MG PO CAPS
200.0000 mg | ORAL_CAPSULE | Freq: Every day | ORAL | Status: DC
Start: 2023-03-13 — End: 2023-03-12

## 2023-03-11 MED ORDER — NON FORMULARY
10.0000 mg | Freq: Every day | Status: DC
Start: 2023-03-11 — End: 2023-03-11

## 2023-03-11 MED ORDER — ONDANSETRON HCL 4 MG/2ML IJ SOLN
Freq: Once | INTRAMUSCULAR | Status: DC | PRN
Start: 2023-03-11 — End: 2023-03-11
  Administered 2023-03-11: 4 mg via INTRAVENOUS

## 2023-03-11 MED ORDER — KETAMINE HCL 10 MG/ML IJ SOLN
INTRAMUSCULAR | Status: AC
Start: 2023-03-11 — End: 2023-03-11
  Filled 2023-03-11: qty 20

## 2023-03-11 MED ORDER — ROPIVACAINE HCL 5 MG/ML IJ SOLN
INTRAMUSCULAR | Status: AC
Start: 2023-03-11 — End: 2023-03-11
  Administered 2023-03-11: 10 mL
  Administered 2023-03-11: 7.5 mL

## 2023-03-11 MED ORDER — ACETAMINOPHEN 500 MG PO TABS
1000.0000 mg | ORAL_TABLET | Freq: Once | ORAL | Status: AC
Start: 2023-03-11 — End: 2023-03-11
  Administered 2023-03-11: 1000 mg via ORAL
  Filled 2023-03-11: qty 2

## 2023-03-11 MED ORDER — KETOROLAC TROMETHAMINE 15 MG/ML IJ SOLN
15.0000 mg | Freq: Three times a day (TID) | INTRAMUSCULAR | Status: DC
Start: 2023-03-11 — End: 2023-03-12
  Administered 2023-03-11 – 2023-03-12 (×3): 15 mg via INTRAVENOUS
  Filled 2023-03-11 (×4): qty 1

## 2023-03-11 MED ORDER — TRANEXAMIC ACID-NACL 1000-0.7 MG/100ML-% IV SOLN
1000.0000 mg | Freq: Once | INTRAVENOUS | Status: AC
Start: 2023-03-11 — End: 2023-03-11
  Administered 2023-03-11: 1000 mg via INTRAVENOUS
  Filled 2023-03-11: qty 100

## 2023-03-11 MED ORDER — SODIUM CHLORIDE 0.9 % IV SOLN
INTRAVENOUS | Status: DC | PRN
Start: 2023-03-11 — End: 2023-03-11
  Administered 2023-03-11: 40 ug/min via INTRAVENOUS
  Administered 2023-03-11: 25 ug/min via INTRAVENOUS
  Administered 2023-03-11: 40 ug/min via INTRAVENOUS
  Administered 2023-03-11: 25 ug/min via INTRAVENOUS
  Administered 2023-03-11: 15 ug/min via INTRAVENOUS

## 2023-03-11 MED ORDER — CELECOXIB 100 MG PO CAPS
200.0000 mg | ORAL_CAPSULE | Freq: Once | ORAL | Status: AC
Start: 2023-03-11 — End: 2023-03-11
  Administered 2023-03-11: 200 mg via ORAL
  Filled 2023-03-11: qty 2

## 2023-03-11 MED ORDER — ONDANSETRON HCL 4 MG/2ML IJ SOLN
4.0000 mg | Freq: Once | INTRAMUSCULAR | Status: DC | PRN
Start: 2023-03-11 — End: 2023-03-11

## 2023-03-11 MED ORDER — FENTANYL CITRATE 0.05 MG/ML IJ SOLN
INTRAMUSCULAR | Status: AC
Start: 2023-03-11 — End: 2023-03-11
  Administered 2023-03-11: 25 ug via INTRAVENOUS

## 2023-03-11 MED ORDER — PANTOPRAZOLE SODIUM 40 MG PO TBEC
40.0000 mg | DELAYED_RELEASE_TABLET | Freq: Every day | ORAL | Status: DC
Start: 2023-03-12 — End: 2023-03-12
  Administered 2023-03-12: 40 mg via ORAL
  Filled 2023-03-11: qty 1

## 2023-03-11 MED ORDER — BISACODYL 10 MG PR SUPP
10.0000 mg | Freq: Every day | RECTAL | Status: DC | PRN
Start: 2023-03-11 — End: 2023-03-12

## 2023-03-11 MED ORDER — OXYCODONE HCL 10 MG PO TABS
10.0000 mg | ORAL_TABLET | ORAL | Status: DC | PRN
Start: 2023-03-11 — End: 2023-03-12
  Administered 2023-03-11 – 2023-03-12 (×4): 10 mg via ORAL
  Filled 2023-03-11 (×4): qty 1

## 2023-03-11 MED ORDER — CEFAZOLIN SODIUM 1 G IJ SOLR
INTRAMUSCULAR | Status: AC
Start: 2023-03-11 — End: 2023-03-11
  Filled 2023-03-11: qty 10

## 2023-03-11 MED ORDER — MORPHINE SULFATE 2 MG/ML IV SOLN (SUPER ERX)
2.0000 mg | Status: AC | PRN
Start: 2023-03-11 — End: 2023-03-12
  Filled 2023-03-11: qty 1

## 2023-03-11 MED ORDER — DEXAMETHASONE SODIUM PHOSPHATE 4 MG/ML IJ SOLN
Freq: Once | INTRAMUSCULAR | Status: DC | PRN
Start: 2023-03-11 — End: 2023-03-11
  Administered 2023-03-11: 4 mg via INTRAVENOUS

## 2023-03-11 MED ORDER — NON FORMULARY
2.5000 ug | Freq: Two times a day (BID) | Status: DC
Start: 2023-03-11 — End: 2023-03-12

## 2023-03-11 MED ORDER — APIXABAN 2.5 MG PO TABS
2.5000 mg | ORAL_TABLET | Freq: Two times a day (BID) | ORAL | Status: DC
Start: 2023-03-12 — End: 2023-03-12
  Administered 2023-03-12: 2.5 mg via ORAL
  Filled 2023-03-11: qty 1

## 2023-03-11 MED ORDER — PHENYLEPHRINE HCL-NACL 0.4-0.9 MG/10ML-% IV SOSY
PREFILLED_SYRINGE | Freq: Once | INTRAVENOUS | Status: DC | PRN
Start: 2023-03-11 — End: 2023-03-11
  Administered 2023-03-11: 80 ug via INTRAVENOUS

## 2023-03-11 MED ORDER — CETIRIZINE HCL 10 MG PO TABS
10.0000 mg | ORAL_TABLET | Freq: Every day | ORAL | Status: DC
Start: 2023-03-11 — End: 2023-03-12
  Administered 2023-03-11 – 2023-03-12 (×2): 10 mg via ORAL
  Filled 2023-03-11 (×2): qty 1

## 2023-03-11 MED ORDER — BUPRENORPHINE HCL-NALOXONE HCL 8-2 MG SL SUBL
20.0000 mg | SUBLINGUAL_TABLET | Freq: Two times a day (BID) | SUBLINGUAL | Status: DC
Start: 2023-03-11 — End: 2023-03-11

## 2023-03-11 MED ORDER — MIDAZOLAM HCL 2 MG/2 ML IJ SOLN
INTRAMUSCULAR | Status: AC
Start: 2023-03-11 — End: 2023-03-11
  Administered 2023-03-11: 2 mg via INTRAVENOUS

## 2023-03-11 MED ORDER — ROPIV-EPI-CLONIDINE-KETOROLAC 123-0.25-0.04- 15 MG/50ML PA SOSY
50.0000 mL | PREFILLED_SYRINGE | Freq: Once | PERIARTICULAR | Status: AC
Start: 2023-03-11 — End: 2023-03-11
  Administered 2023-03-11: 50 mL
  Filled 2023-03-11: qty 50

## 2023-03-11 MED ORDER — LIDOCAINE HCL (PF) 2 % IJ SOLN
INTRAMUSCULAR | Status: AC
Start: 2023-03-11 — End: 2023-03-11
  Filled 2023-03-11: qty 5

## 2023-03-11 MED ORDER — PROPOFOL 500 MG/50 ML IV
INTRAVENOUS | Status: DC | PRN
Start: 2023-03-11 — End: 2023-03-11
  Administered 2023-03-11: 100 ug/kg/min via INTRAVENOUS
  Administered 2023-03-11: 60 ug/kg/min via INTRAVENOUS
  Administered 2023-03-11: 75 ug/kg/min via INTRAVENOUS

## 2023-03-11 MED ORDER — MIDAZOLAM HCL 2 MG/2 ML IJ SOLN
INTRAMUSCULAR | Status: AC
Start: 2023-03-11 — End: 2023-03-11
  Filled 2023-03-11: qty 2

## 2023-03-11 MED ORDER — LIDOCAINE HCL (PF) 2 % IJ SOLN
Freq: Once | INTRAMUSCULAR | Status: DC | PRN
Start: 2023-03-11 — End: 2023-03-11
  Administered 2023-03-11: 50 mg via INTRAVENOUS

## 2023-03-11 MED ORDER — BUPRENORPHINE HCL-NALOXONE HCL 8-2 MG SL SUBL
20.0000 mg | SUBLINGUAL_TABLET | Freq: Every day | SUBLINGUAL | Status: DC
Start: 2023-03-12 — End: 2023-03-11

## 2023-03-11 MED ORDER — GATORADE REGULAR
Freq: Two times a day (BID) | Status: AC
Start: 2023-03-11 — End: 2023-03-12
  Administered 2023-03-11: 591 mL via ORAL

## 2023-03-11 MED ORDER — DEXAMETHASONE SODIUM PHOSPHATE 4 MG/ML IJ SOLN
INTRAMUSCULAR | Status: AC
Start: 2023-03-11 — End: 2023-03-11
  Filled 2023-03-11: qty 1

## 2023-03-11 MED ORDER — OXYCODONE-ACETAMINOPHEN 5-325 MG PO TABS
1.0000 | ORAL_TABLET | ORAL | Status: DC | PRN
Start: 2023-03-11 — End: 2023-03-11

## 2023-03-11 MED ORDER — MULTIVITAMIN PO TAB
1.0000 | ORAL_TABLET | Freq: Every day | ORAL | Status: DC
Start: 2023-03-11 — End: 2023-03-12
  Administered 2023-03-11 – 2023-03-12 (×2): 1 via ORAL
  Filled 2023-03-11 (×2): qty 1

## 2023-03-11 MED ORDER — ASPIRIN 81 MG PO CHEW
81.0000 mg | CHEWABLE_TABLET | Freq: Every day | ORAL | Status: DC
Start: 2023-03-11 — End: 2023-03-12
  Administered 2023-03-11 – 2023-03-12 (×2): 81 mg via ORAL
  Filled 2023-03-11 (×2): qty 1

## 2023-03-11 MED ORDER — ATORVASTATIN CALCIUM 80 MG PO TABS
80.0000 mg | ORAL_TABLET | Freq: Every day | ORAL | Status: DC
Start: 2023-03-11 — End: 2023-03-12
  Administered 2023-03-11: 80 mg via ORAL
  Filled 2023-03-11: qty 1

## 2023-03-11 MED ORDER — BUPIVACAINE HCL (PF) 0.5 % IJ SOLN
INTRAMUSCULAR | Status: AC
Start: 2023-03-11 — End: 2023-03-11
  Administered 2023-03-11: 2.4 mL via INTRATHECAL

## 2023-03-11 MED ORDER — OXYCODONE HCL 5 MG PO TABS
5.0000 mg | ORAL_TABLET | ORAL | Status: DC | PRN
Start: 2023-03-11 — End: 2023-03-12

## 2023-03-11 MED ORDER — METOCLOPRAMIDE HCL 5 MG/ML IJ SOLN
INTRAMUSCULAR | Status: AC
Start: 2023-03-11 — End: 2023-03-11
  Filled 2023-03-11: qty 2

## 2023-03-11 MED ORDER — BUPRENORPHINE HCL-NALOXONE HCL 8-2 MG SL SUBL
20.0000 mg | SUBLINGUAL_TABLET | Freq: Every day | SUBLINGUAL | Status: DC
Start: 2023-03-11 — End: 2023-03-12
  Administered 2023-03-11 – 2023-03-12 (×2): 2.5 via SUBLINGUAL
  Filled 2023-03-11 (×3): qty 3

## 2023-03-11 SURGICAL SUPPLY — 38 items
3/4 SHEET DRAPE 56X77IN (DRAPE) ×4 IMPLANT
42 TOURNIQUET DISPOSABLE (SURGPNE) ×1 IMPLANT
61911001 CEMENT ×2 IMPLANT
ABD PAD 5"X9" (BANDAGE) ×2 IMPLANT
ACE BANDAGE STERILE 6IN L/F (BANDAGE) ×1 IMPLANT
BACK TABLE COVER 44X76 (DRAPE) ×2 IMPLANT
BASIC SINGLE BASIN TRAY (BASIN) ×1 IMPLANT
BOOT LINER TKR STERILE (POSITION) ×1 IMPLANT
BULKEE II GAUZE BANDAGE 4.5 (BANDAGE) ×1 IMPLANT
CEMENT MIXING BOWL KIT (SURGCMT) ×1 IMPLANT
CHLORAPREP APPLICAOR 26ML ORGN (PREP) ×1 IMPLANT
CRADLE ARM FOAM POSITIONER TLC (POSITION) ×1 IMPLANT
CRYO CUFF KNEE LARGE (THERAPY) ×1 IMPLANT
DRSG,OPTIFOAM 4X12" MSC97412 (DRESSING) ×1 IMPLANT
ESMARK BANDAGE 6 (BANDAGE) ×1 IMPLANT
EZE-BAND BANDAGE 6IN VELCRO (BANDAGE) ×1 IMPLANT
FEM COMPONENT ×1 IMPLANT
FIBERWIRE #2 38IN REV CUT NDL (SUTURE) ×3 IMPLANT
GAUZE SPONGE (DRESSING) ×2 IMPLANT
GOWN FLYTE TOGA 2XL PEEL AWAY (SURGPPE) ×3 IMPLANT
HEAVY DUTY EXTREMITY DRAPE (DRAPE) ×1 IMPLANT
PACK TOTAL JOINT (PACK) ×1 IMPLANT
PATELLA ×1 IMPLANT
POLYSORB SUTURE 1 GS21 30 (SUTURE) ×3 IMPLANT
SAGITTAL BLADE 18MMX90MM (SURGPWR) ×1 IMPLANT
SPONGE LAP,XRAY,RFID (SURGSPG) ×3 IMPLANT
STERILE LIGHTHANDLE COVER (SURGEQUP) ×2 IMPLANT
STRATAFIX 2-0/TS 3-0 PGA/PCL2 (SUTURE) ×2 IMPLANT
SURGEON BLADE #15 (SURGBLA) ×1 IMPLANT
SURGEON GLOVE LF/PF 8 STER (GLOVE) ×3 IMPLANT
SUTURE POLYSORB 0 GS21 30IN (SUTURE) ×2 IMPLANT
SUTURE POLYSORB 2-0 GS21 30IN (SUTURE) ×2 IMPLANT
SYSTEM IRRIGATION, SURGIPHOR (IRR) ×1 IMPLANT
SZ 8 L/F ORTH SURG GLOVE (GLOVE) ×1 IMPLANT
TIB BEARING INSERT X3 9MM SZ 4 ×1 IMPLANT
TIBIAL BASE PLATE 5520B400 ×1 IMPLANT
TOWEL,OR, BLUE,STR (SURGTWL) ×3 IMPLANT
WEBRIL PADDING 6IN (CAST&SPL) ×1 IMPLANT

## 2023-03-11 NOTE — Progress Notes (Addendum)
Inpatient Physical Therapy Initial Evaluation    S: Pt awake and agreeable to PT.    O: PT initial evaluation completed today. BP maintained stable and no s/s of orthostasis. Please see Vital Sign and Pain Assessment flowsheets for vitals and pain documentation.  Plan of care has been reviewed and updated as necessary.    BP in bed 136/76   BP in standing 146/80        03/11/23 1410   Language Information   Language Needs Met By: Lenox Ponds Used Effectively By Patient   Evaluation Type   Evaluation Type Initial Evaluation   Rehab Discipline   Rehab Discipline PT   Safety Devices   Type of Devices Call bell in place;Bed alarm in place   Weight Bearing Status   RLE WBAT   LLE FWB   RUE FWB   LUE FWB   Premorbid Mobility   Transfers Independent   Walking Independent;Used assistive device   Walking assistive devices used Straight cane   ADL / IADL Baseline Status   ADL/IADL Baseline Status Yes   ADL's/IADL's   Dressing Independent   Bathing Independent   Toileting Independent   Premorbid Sensory   Hearing Normal   Sensory Vision Normal   Premorbid Cognition   Cognition Intact   Premorbid Communication   Communication Normal   Living Situation   Living Field seismologist assessible apartment   Lives With Alone   Exterior Home Access No steps   Interior Home Access No steps   Strengths   Strengths Premorbid level of function;Support of immediate family;Home design;Ability to acquire knowledge   Barriers   Barriers Comorbidities   Cognition   Attention [facility]   Orientation Level Grossly intact   RUE Assessment   RUE Assessment WFL   LUE Assessment   LUE Assessment WFL   RLE Assessment   RLE Assessment X  (s/p R TKA)   LLE Assessment   LLE Assessment WFL   Mobility / Balance   Mobility / Balance Yes   Wt Bearing Compliance Yes   Bed Mobility   Supine to Sit Supervision   Sit to Supine Supervision   Transfers   Transfer Yes   Transfer 1   Transfer From 1 Sit   Transfer Type 1 To and from   Transfer to 1 Stand   Technique 1 Sit to  stand;Stand to sit   Transfer Device 1 Rolling walker (Front wheel walker)   Transfer Level of Assistance 1 Close supervision   Trials/Comments 1 1   Gait   Gait Yes   Gait 1   Assistive Device 1 Rolling walker (Front wheel walker)   Pattern 1 R Decreased stance time;Decreased stride length  (step-to)   Gait Assistance 1 Close supervision   Distance (Ft) 1 30 Feet   Activity Tolerance   Activity Tolerance Endurance does not limit participation   Balance   Sitting - Static Sits without support for > 30 sec;Feet unsupported   Sitting - Dynamic No upper extremity supported;Feet unsupported;Moves/returns truncal midpoint 1-2 inches in multiple planes   Standing - Static No upper extremity supported;Able to maintain 60 sec   Standing - Dynamic Forward lean   Posture Rounded shoulders   Ther Exercise   Therapeutic Exercise? No   Coordination   Gross Motor Performance Observation Stiff   Plan   Prognosis Good   PT Frequency 7x/wk   Recommendation   Recommendation Home PT   Equipment Recommended Rolling walker (Front wheel walker)  A: Pt is a 67 year old female with PMH including pioid use disorder, CVA (11/2021), aortic aneurysm, COPD, HTN, pre-DM admitted for R TKA. PT consult received, medical chart reviewed. Pt lives alone in an elevator accessible apt, no STE. 3 adult children live nearby and able to assist w/ grocery shopping. Pt baseline independent w/ ADLs/IADLs and ambulating w/ cane. Pt does not have a RW and will need a RW at d/c.    Pt alert and cooperative. Pt demonstrating bed mobility w/ supervision. Pt performing sit <> stand w/ RW and close supervision. Pt able to maintain good standing balance w/o AD. Pt ambulating w/ RW and close supervision, presenting w/ slow step-to pattern, steady and no LOB. Pt tolerated session well and will continue to benefit from acute skilled PT during her admission to improve functional mobility.       PT Short Term Goals:  Patient will perform bed mobility independently  in 3 treatments.  Patient will perform functional transfers independently in 3 treatments.  Patient will ambulate 100 feet with supervision with rolling walker (front wheel walker) in 3 treatments.    P: PT 7 times a week while inpatient for transfer training, gait training, and therapeutic exercise as tolerated. Recommend home PT w/ RW upon d/c.    Linde Gillis, PT/s

## 2023-03-11 NOTE — Interval H&P Note (Signed)
Patient Assessment Update: (Fill out Prior to procedure or within 24 hours of  admission if H&P done pre-admission.)   Re-evaluation including history review and physical examination has been performed.    Patient's Condition Changes as follows: The patient pain persists.  He has a known history of degenerative arthritis of the right knee with acquired genu varum refractory to conservative measures.    She is otherwise been feeling well.    She has been cleared from a medical and cardiology standpoint.  Further details noted in this thread below:    Gabriel Rainwater, MD  Linus Salmons, MD; Shade Flood, APRN  Phone Number: [phone]     Thank you all for help.  Will proceed with surgery as planned.  Bill          Previous Messages       ----- Message -----  From: Lamona Curl, PA-C  Sent: 03/06/2023   3:12 PM EST  To: Shade Flood, APRN; Hildred Laser, MD; *  Subject: RE: preop cardiac risk asessment                I saw this patient for preop exam and she had a normal cardiovascular exam with no evidence of heart failure and no concerning cardiac symptoms.  Echo was ordered to assess for PFO as part of stroke workup as Dr. Diona Foley noted.  However I do not think an echo would reveal anything that would require intervention prior to planned surgery.      As mentioned there is no clinical concern for heart failure or other cardiac disease.  If she was found to have a PFO, that does not prove it played any role in the stroke she had. It could be an innocent bystander given she does have other stroke risk factors.  Additionally, while she will be having surgery and will be at elevated thromboembolic risk, she will be anticoagulated postop and so I think PFO even if present would not necessarily pose increased stroke risk.  Also, after doing some brief research into this topic it seems that most patients over 60 are not considered a candidate for PFO closure, so there would likely not be any  intervention even if a PFO was found.    I think she could proceed with her knee surgery without completing the previously ordered echo with bubble study.  ----- Message -----  From: Hildred Laser, MD  Sent: 03/06/2023   2:52 AM EST  To: Shade Flood, APRN; Lamona Curl, PA-C; *  Subject: RE: preop cardiac risk asessment                The echo was ordered to rule out PFO as part of a workup for a stroke.  I think we probably should get it to complete this workup prior to surgery, and also because she had some abnormalities on recent EKGs (possible LVH, nonspecific T-wave changes).    Arleigh -- you saw her for the preop appointment.  Any other thoughts?  ----- Message -----  From: Shade Flood, APRN  Sent: 03/05/2023  10:05 AM EST  To: Hildred Laser, MD; Gabriel Rainwater, MD  Subject: preop cardiac risk asessment                    Dr Diona Foley,    Your patient Havanah Patla is scheduled for a Right TKA under spinal anesthesia w/block with Dr Sallee Lange 11/13. It looks like she was rec to have  cardiology follow up and an ECHO after her CVA 12/11/2021 which was never completed. I think you are seeing her today for preop clearance?  She will need the ECHO prior to surgery unless it has been done outside of what I can see in EPIC unless you feel strongly otherwise, then I will run it by the anesthesia team.    I appreciate your input.    Thanks  Helmut Muster  PAT Anesthesia NP      Will proceed today with planned total knee arthroplasty, right.  Patient understands the usual postoperative course, anticipated outcome, risks and benefits of the procedure.    Gabriel Rainwater, MD, 03/11/2023

## 2023-03-11 NOTE — Plan of Care (Signed)
Problem: Pain  Goal: Patient's pain/discomfort is manageable  Description: Assess and monitor patient's pain using appropriate pain scale. Collaborate with interdisciplinary team and initiate plan and interventions as ordered. Re-assess patient's pain level 30 - 60 minutes after pain management intervention.   Outcome: Progressing   Assess and monitor pt's pain using pain scale , initiate plan and interventions as ordered , reassess pt's pain 30 - 60 minutes after pain management intervention .

## 2023-03-11 NOTE — Anesthesia Procedure Notes (Addendum)
Peripheral Block    Patient location during procedure: pre-op  Start time: 03/11/2023 7:45 AM  End time: 03/11/2023 7:56 AM    Authorized by: Polo Riley, MD    Performed by: Polo Riley, MD    Reason for block: at surgeon's request and post-op pain management    Preanesthetic Checklist  Completed: patient identified, site marked, surgical consent, pre-op evaluation, timeout performed, IV checked, risks and benefits discussed and monitors and equipment checked  Peripheral Block  Patient position: supine  Prep: ChloraPrep  Patient monitoring: heart rate, cardiac monitor and continuous pulse ox  Block type: adductor canal and IPACK  Laterality: right  Injection technique: single-shot  Procedures: ultrasound guided  Needle  Needle gauge: 22 G  Needle length: 80 mm  Needle localization: ultrasound guidance  Assessment  Injection assessment: negative aspiration for heme, no paresthesia on injection, incremental injection and local visualized surrounding nerve on ultrasound  Paresthesia pain: none  Heart rate change: no  Slow fractionated injection: yes    Medications Administered  ropivacaine (NAROPIN) injection 0.5% - Infiltration   7.5 mL - 03/11/2023 7:56:00 AM   10 mL - 03/11/2023 7:50:00 AM  midazolam (VERSED) injection - Intravenous   2 mg - 03/11/2023 7:40:00 AM  fentaNYL (SUBLIMAZE) injection - Intravenous   25 mcg - 03/11/2023 7:40:00 AM    Billing Statements:  Local anesthetic visualized around the nerve  Picture documentation stored in record  Additional Notes  Right adductor nerve block with 20 ml of 0.25% ropivacaine  Right IPACK nerve block with 15 ml of 0.25% ropivacaine    Patient given minimal sedation. She was awake and conversant throughout procedure. Patient tolerated procedure well

## 2023-03-11 NOTE — Op Note (Signed)
Procedure(s):  ARTHROPLASTY, KNEE, TOTAL Operative Note     Date: 03/11/2023  Location: [facility]    Name: Cheryl Dixon, DOB: May 12, 1955, MRN: [id number]    Diagnosis  Pre-op Diagnosis      * Primary osteoarthritis of right knee [M17.11] Post-op Diagnosis     * Primary osteoarthritis of right knee [M17.11]     Procedure:    ARTHROPLASTY, KNEE, TOTAL  CPT(R) Code:  19147 - PR ARTHRP KNE CONDYLE&PLATU MEDIAL&LAT COMPARTMENTS    Surgeons and Role:     * Gabriel Rainwater, MD - Primary       * Darra Lis, PA-C - Assistant    Procedure Summary  Anesthesia: Spinal with Block  ASA: ASA 3  Estimated Blood Loss: 50 (units unknown)  Total IV Fluids: 950 mL  Drains:  Peripheral IV 03/11/23 Left;Posterior Mid forearm (Active)   Site Assessment Clean;Dry;Intact 03/11/23 0700   Line Status Saline locked;Flushed 03/11/23 0700   Dressing Status Clean;Dry;Intact 03/11/23 0700      Specimens: Soft tissue right knee  Order Name Source Comment Collection Info Order Time   CBC, PLATELET & DIFFERENTIAL   Collected By: Blenda Bridegroom, RN 03/11/2023  6:31 AM     Release result to patient via [facility]   Immediate        COMPREHENSIVE METABOLIC PANEL   Collected By: Blenda Bridegroom, RN 03/11/2023  6:31 AM     Release result to patient via [facility]   Immediate           Implant(s):   Implant Name Type Inv. Item Serial No. Manufacturer Lot No. LRB No. Used Action   [id number] CEMENT - [id number]  [id number] CEMENT  [employer] [id number] Right 1 Implanted   [id number] CEMENT - [id number]  [id number] CEMENT  [employer] [id number] Right 1 Implanted   FEM COMPONENT - [id number]  FEM COMPONENT  STRYKER [id number] Right 1 Implanted   TIBIAL BASE PLATE [id number] - [id number]  TIBIAL BASE PLATE [id number]  STRYKER [id number] Right 1 Implanted   PATELLA - [id number]  PATELLA  STRYKER Right 1 Implanted   TIB BEARING INSERT X3 SZ 4 - [id number]  TIB BEARING INSERT X3 SZ 4  STRYKER [id number] Right 1 Implanted     Staff:   Circulator: Tyler Pita, RN  Surgical Tech: Aundra Millet  Relief Circulator: Christianne Dolin, RN; Ramiro Harvest, RN  PA: Darra Lis, PA-C  Relief Surgical Tech: Juliene Pina    Indications: Cheryl Dixon is an 67 year old female who is having surgery for Primary osteoarthritis of right knee [M17.11].    Procedure Details:  Regional block anesthetic was given by the anesthesiologist in the preoperative area.  The patient was then brought to the operating room. A surgical safety check was led by the anesthesia team.  Spinal anesthesia was now placed by the anesthesiologist.  The patient was placed in a comfortable supine position on the operating table.  The right lower extremity was prepped and draped sterilely.  A tourniquet was applied at the thigh.  A sterile leg holder was used during the case.  The right lower extremity was now prepped and draped sterilely.  A final surgical timeout was performed confirming proper patient, procedure to be performed, and laterality.  The leg was now exsanguinated with an Esmarch dressing and the tourniquet inflated to 250 mmHg.    A longitudinal incision was made over the anterior aspect of the right knee centered over the patella.  The incision was carried  down through the subcutaneous tissues.  Bleeding points were controlled with electrocautery as encountered throughout the course of the dissection.  A limited full-thickness skin and subcutaneous fat flap was raised and retracted.  A medial parapatellar retinacular and capsular incision was now made.  The patella was reflected laterally.  The fat pad was excised.  The knee was flexed.  Retractors were placed to protect surrounding soft tissue.  Soft tissue over the anterior aspect of the distal femur was now incised longitudinally and elevated from the underlying distal femur.  A limited synovectomy was performed as necessary with a rongeur.  The anterior cruciate ligament was excised.  The anterior half of the medial and lateral menisci was excised.  Retractors  were placed to protect the surrounding soft tissue.  Rimming osteophytes of the distal femur and the tibia were removed with a rongeur.    An entry point was selected at the top of the femoral notch.  A drill hole was made just off to the medial side of the top of the femoral notch.  The intramedullary rod was passed down the femoral canal.    The distal femoral cutting guide was now placed set at 5  degrees valgus with an 8 mm cut to be taken.  The distal femoral cutting guide was positioned and pinned in place.  The intramedullary rod was removed.  The distal femoral cut was now made with an oscillating saw.  The cutting jig was removed.    The femoral sizing jig was now placed.  A #4 prosthesis look to give the best fit.  The sizing jig was removed.  A #4 femoral cutting block was now impacted into position.  Femoral cuts were made anterior, posterior, posterior chamfer and anterior chamfer.  The cutting jig was removed.  A #4 trial right femoral compound was now impacted onto the distal femur.  Good fit was noted.  The trial component was removed.    A retractor was placed to protect the PCL.  The remaining portions of the medial lateral menisci were now removed with sharp dissection.  Again any potential bleeding points were controlled with electrocautery.  An entry point was selected in the proximal tibia at about the native insertion point of the anterior cruciate ligament.  A drill hole was made there.  The intramedullary rod was passed down the tibial shaft.  The tibial cutting guide was now placed and positioned.  It was measured off of the medial compartment for 2 mm cut.  The cutting guide was pinned in place.  The intramedullary rod was removed.  The proximal tibial cut was now made with an oscillating saw.    A #4 trial tibial tray was now placed.  Good fit was noted.  Proper rotation was now set with an extramedullary rod aligned to the first webspace.  With proper rotation set, the tibial tray was  pinned in place.  The keel punch was now impacted into position and removed.  A trial reduction was now made with a #4 femoral component, #4 tibial component and a 9 mm trial tibial spacer in place.  With this construct in place a full range of motion of the knee was noted.    Attention was turned to the patella.  Rimming osteophytes were removed with a rongeur.  Patella thickness was measured at 18 mm.  The patella cutting guide was now clamped in place.  8 mm of patella were resected with an oscillating saw.  The 29  mm patella drill guide was clamped in place.  3 drill holes were made.  The trial 29 mm patella component was now placed.  The patella height was restored to 18 mm.  The range of motion again was assessed.  The patella tracked well.  A full range of motion of the knee was noted with no instability.    All trial components were now removed.      The operative field was thoroughly irrigated with and bony surfaces thoroughly dried.    Two bags of methylmethacrylate bone cement were prepared under vacuum.  Bone cement was now applied to the raw bony surface of the proximal tibia.  The #4 [name] tibial component was now impacted into position.  Excess bone cement was removed as necessary.  Next the #4 [name] femoral component was now cemented in place.  This was impacted into position.  Again excess bone cement was removed as necessary.  The trial 9 mm polyethylene spacer was now placed and the knee extended.  Lastly, the 29 mm patella component was now cemented in place and again excess bone cement removed as necessary.  The patella clamp applied.  The knee was now held in extension until the cement cured.    At this point the tourniquet was released.  The total tourniquet time was 122 minutes.      Once cement was cured range of motion of the knee was again assessed.  Full extension was achieved with flexion to 120 degrees.  Patella tracked well.  No untoward instability.    The knee  was again irrigated.  Any other potential bleeding points were controlled with electrocautery.  The 9 mm polyethylene spacer was now impacted in position.  A final range of motion was checked.  Again full range of motion was achieved with no instability.    The retinaculum was repaired side to side with multiple FiberWire sutures reinforced with a running FiberWire suture.  Subcutaneous and deep subcuticular layers were now closed with Vicryl suture.  Additional local field block anesthetic was now given with multimodal mixture.  Skin closure was now completed with Mastisol and SteriStrips.  Skin glue was applied.  An occlusive dressing was then applied.  Dressing was completed with an Ace wrap from toes to thigh.     Darra Lis, PA-C assisted me throughout the course of this entire procedure. This included preparation of the patient, proper positioning, tourniquet application, retraction, jig placement and pinning, wound closure, dressing application. This was all done under my direct guidance.  No qualified orthopedic resident was available to assist during this procedure.     Preoperative antibiotics have been ordered and given within 1 hours of incision.   Venous thrombosis prophylaxis have been ordered including unilateral sequential compression device    Findings: The patient degenerative arthritic changes of the right knee.  There was full-thickness articular cartilage loss throughout particularly involving the medial compartment.  Extensive tricompartmental osteophyte formation present.  No loose bodies.  Moderate synovial hypertrophy.  The was degenerative tearing of the medial meniscus particular involving the posterior one third.  Lateral meniscus, ACL and PCL intact.    Complications:  None; patient tolerated the procedure well.     Disposition: PACU - hemodynamically stable.  Condition: stable    Gabriel Rainwater, MD  Phone Number: [phone]

## 2023-03-11 NOTE — Anesthesia Postprocedure Evaluation (Signed)
Anesthesia Post-Operative Evaluation Note    Patient: Cheryl Dixon           Procedure Summary       Date: 03/11/23 Room / [facility]    Anesthesia Start: 0800 Anesthesia Stop:     Procedure: ARTHROPLASTY, KNEE, TOTAL (Right: Knee) Diagnosis:       Primary osteoarthritis of right knee      (Primary osteoarthritis of right knee [M17.11])    Surgeons: Gabriel Rainwater, MD Responsible Provider: Rod Mae, MD    Anesthesia Type: spinal, regional, TIVA ASA Status: 3              POST-OPERATIVE EVALUATION    Anesthesia Post Evaluation    Vitals signs in patient's normal range: Yes  Respiratory function stable; airway patent: Yes  Cardiovascular function stable: Yes  Hydration status stable: Yes  Mental status recovered; patient participates in evaluation and/or is at baseline: Yes  Pain control satisfactory: Yes  Nausea and vomiting control satisfactory: YesProcedure was labor & delivery no  PostOP disposition PACU  Anesthesia Observation no significant observation    MIPS#404 Anesthesiology Smoking Abstinence:     The patient is a current smoker (e.g. cigarette, cigar, pipe, e-cigarette/vaping/marijuana) (G9562): Yes   The patient underwent an elective surgery or procedure requiring anesthesia (Z3086) : Yes   The patient received preop smoking cessation instructions prior to the day of surgery or procedure by MD, APC or RN proxy staff [phone]): Yes   The patient smoked the day of the procedure (N6295): No  FOR CODING USE ONLY: IF BLANK M8413    MIPS#477 Multimodal Pain Management:  Emergent - Exclusion case: No  Patient was administered multimodal pain management (two or more drugs and/or interventions excluding systemic opioids) in the perioperative period; occurring at some time between 6 hours prior to anesthesia start time until discharged from PACU (K4401): Yes   FOR CODING USE ONLY: REASON NOT LISTED U2725    DGUY#403 Perioperative Temperature Management:  Anesthesia start to Anesthesia end time  was 60 minutes or longer (4255F): Yes   Anesthesia administered was General (Inhalational, or TIVA) or Neuraxial block (K7425): Yes   At least one body temperature greater than 95.2F/35.5C achieved within the 30 mins immediately prior to or the15 mins immediately following anesthesia end time (Z5638): Yes   FOR CODING USE ONLY: REASON NOT LISTED V5643    MIPS#430 Adult Prevention of PONV:  Patient received an inhalational anesthetic (PI951): No  FOR CODING USE ONLY: REASON NOT LISTED O8416    SAYT#016 Pediatric Prevention of PONV:  Pediatric patient?: No  FOR CODING USE ONLY: REASON NOT LISTED W1093        eOptimetrix # [id number]          Last vitals    BP: 112/45 (03/11/2023 12:53 PM)  Temp: 98.1 F (36.7 C) (03/11/2023 12:53 PM)  Pulse: 75 (03/11/2023 12:53 PM)  Resp: 16 (03/11/2023 12:53 PM)  SpO2: 93 % (03/11/2023 12:53 PM)

## 2023-03-11 NOTE — Brief Op Note (Signed)
Brief Procedure or Operative Note    Procedure:  Procedure(s):  ARTHROPLASTY, KNEE, TOTAL    Pre-Procedure Diagnosis: Pre-op Diagnosis      * Primary osteoarthritis of right knee [M17.11]     Post-Procedure Diagnosis: Post-op Diagnosis     * Primary osteoarthritis of right knee [M17.11]    Surgeon: Primary: Gabriel Rainwater, MD    Assistant : PA: Darra Lis, PA-C      Type of Anesthesia:  Spinal with Block    Findings:   The patient degenerative arthritic changes of the right knee.  There was full-thickness articular cartilage loss throughout particularly involving the medial compartment.  Extensive tricompartmental osteophyte formation present.  No loose bodies.  Moderate synovial hypertrophy.  The was degenerative tearing of the medial meniscus particular involving the posterior one third.  Lateral meniscus, ACL and PCL intact.    Estimated Blood Loss: 50 mL    Specimens Removed:   ID Type Source Tests Collected by Time   A : Right Knee Bone and Tissue Knee Bone SURGICAL PATH SPECIMEN BONE Gabriel Rainwater, MD 03/11/2023 [phone]       Complications:  None    Other (e.g. Implants):    Implant Name Type Inv. Item Serial No. Manufacturer Lot No. LRB No. Used Action   [id number] CEMENT - [id number]  [id number] CEMENT  [employer] [id number] Right 1 Implanted   [id number] CEMENT - [id number]  [id number] CEMENT  [employer] [id number] Right 1 Implanted   FEM COMPONENT - [id number]  FEM COMPONENT  STRYKER [id number] Right 1 Implanted   TIBIAL BASE PLATE [id number] - [id number]  TIBIAL BASE PLATE [id number]  STRYKER [id number] Right 1 Implanted   PATELLA - [id number]  PATELLA  STRYKER Right 1 Implanted   TIB BEARING INSERT X3 SZ 4 - [id number]  TIB BEARING INSERT X3 SZ 4  STRYKER [id number] Right 1 Implanted      Peripheral IV 03/11/23 Left;Posterior Mid forearm (Active)   Site Assessment Clean;Dry;Intact 03/11/23 0700   Line Status Saline locked;Flushed 03/11/23 0700   Dressing Status Clean;Dry;Intact 03/11/23 0700         Gabriel Rainwater,  MD  Pager :                   .

## 2023-03-11 NOTE — Discharge Instructions (Signed)
-   You had a right total knee replacement with Dr Sallee Lange on 03/11/23  - You may put as much weight on your operative leg as you can tolerate. Use your walker to help you walk.   - Keep your incision covered. The Optifoam dressing should be replaced with a new Optifoam on POD#7, 03/18/23.  If the dressing comes off accidentally, cover the incision with gauze. Do not get the incision wet.   - You may shower with Optifoam in place. Do not submerge the dressing. Keep the dressing clean and dry.  - You can remove the second Optifoam 2 weeks after surgery, 03/25/23.  You can then leave the incision open to air and can get the incision wet 2 weeks after surgery.  - You do not need to have any stitches removed.  They are buried under the skin and will dissolve over time.  - You will be on Eliquis, a blood thinner, for 6 weeks postop to prevent blood clots. Your last dose will be on 04/22/23. Stay on your normal ASA during this time.   - You have been given a prescription for narcotic pain medication. Please take as directed and taper down use as tolerated. Drink plenty of water and take Colace (stool softener) to help prevent constipation. You may take Senna (laxative) nightly as well.  - Attend your physical therapy and orthopaedic follow-up appointments.  - If you need a refill of your pain medication, please call 48 hours in advance. All requests for medication refills will be addressed within 48 hours. No prescription refills will be written after hours (5pm-8:30am) or on the weekend.  - Please call the orthopaedic clinic at [phone] if you have increased pain, numbness/tingling, drainage from your incision, fevers, chills, nausea/vomiting, if you have a fall, or have any other questions/concerns. Please go to the nearest emergency room if you experience chest pain or shortness of breath.

## 2023-03-11 NOTE — RN Shift Note (Signed)
RN SHIFT NOTE       Pt received from PACU , alert and oriented x 4 , R knee wrapped in ace , cyrocuff on , pt denies pain at this time , ERAS maintained , LSC , abdomen soft non tender + BS , + PP , neuro cks see flow sheet , oriented to room , call light system .

## 2023-03-11 NOTE — Discharge Summary (Signed)
Physician Discharge Summary     Patient ID:  Mattalynn Baumel  [id number]  67 year old  04-07-1956    Admit date: 03/11/23    Discharge date and time: 03/12/23     Admitting Physician: Gabriel Rainwater, MD     Discharge Physician: Gabriel Rainwater, MD    Discharge Diagnoses:   S/p right TKR  OUD on suboxone  Hypertension  AAA  COPD  CVA    Admission Condition: good    Discharged Condition: good    Indication for Admission: pain control on suboxone with OUD, hx of CVA    Hospital Course: Patient was brought to the OR for elective right total knee arthroplasty with Dr. Sallee Lange. Pre-op nerve block was performed. The procedure went well and there were no intraoperative complications. EBL was 50cc. The patient was brought to the PACU in stable condition with Optifoam dressing, Ace wrap and cryocuff in place. Patient was admitted to the inpatient floor in stable condition.    On POD 1, there were no overnight events. She denies any chest pains, shortness of breath, nausea, vomiting, dizziness or distal numbness or tingling. Eliquis and aspirin was started for DVT prophylaxis. Her pain is managed on a multi-modal oral regimen. She is tolerating an oral diet. She worked with PT/OT and made progress. Plan for discharge home with services today..     Consults: physical therapy    Significant Diagnostic Studies: labs: CBC, BMP    Treatments: IV hydration, antibiotics: Ancef, analgesia: acetaminophen, suboxone and oxycodone, and anticoagulation: baby aspirin and eliquis    Discharge Exam:  Vital Signs: BP 130/54   Pulse 68   Temp 98.4 F (36.9 C) (Oral)   Resp 18   Ht 5\' 1"  (1.549 m)   Wt 89.6 kg (197 lb 8 oz)   LMP 11/20/1991   SpO2 92%   BMI 37.32 kg/m   Gen: Alert and oriented x 3. No acute distress.  HEENT: Atraumatic, normocephalic. Conjunctivae clear. Mucous membranes moist.  Neck: Trachea midline and no noticeable JVD  Pulm: Non-labored breathing and does not appear short of breath  Musculoskeletal: Right TKR:  Optifoam and Ace dressing clean/dry/intact. No new erythema, ecchymosis, or drainage. Thigh and calf soft and non-tender. Gross motor and sensation intact. Can dorsi and planter flex feet. Toes warm and well perfused. 2+ DP  Neuro: Normal mentation and speech.    Disposition: Home with services    Patient Instructions:   Current Discharge Medication List    START taking these medications    apixaban (ELIQUIS) 2.5 MG po tablet  Take 1 tablet by mouth in the morning and 1 tablet before bedtime.  Qty: 60 tablet Refills: 1    oxyCODONE (ROXICODONE) 5 MG immediate release tablet  Take 1-2 tablets by mouth every 4 (four) hours as needed Max Daily Amount: 60 mg  for up to 7 days  Qty: 30 tablet Refills: 0  Comments: Partial fill upon patient's request.    pantoprazole (PROTONIX) 40 MG tablet  Take 1 tablet by mouth daily  Qty: 30 tablet Refills: 0      CONTINUE these medications which have CHANGED    acetaminophen (TYLENOL) 500 MG tablet  Take 2 tablets by mouth every 8 (eight) hours as needed for Pain  Qty: 60 tablet Refills: 0      CONTINUE these medications which have NOT CHANGED    buprenorphine-naloxone (SUBOXONE) 8-2 MG sublingual film  Take 2.5 films under the tongue daily.  Qty:  70 Film Refills: 0  Comments: Partial fill upon patient request.  Associated Diagnoses:Opioid dependence on agonist therapy (HCC)    tiotropium-olodaterol (STIOLTO RESPIMAT) 2.5-2.5 MCG/ACT inhal  Inhale 2 puffs into the lungs daily    famotidine (PEPCID) 20 MG tablet  TAKE 1 TABLET BY MOUTH IN THE MORNING AND BEFORE BEDTIME  Qty: 180 tablet Refills: 0  Associated Diagnoses:Epigastric pain    solifenacin (VESICARE) 10 MG tablet  Take 1 tablet by mouth in the morning.  Qty: 90 tablet Refills: 0  Comments: Requests trade name  Associated Diagnoses:Overactive bladder    buPROPion (WELLBUTRIN SR) 200 MG 12 hr tablet  TAKE 1 TABLET BY MOUTH IN THE MORNING AND BEFORE BEDTIME  Qty: 180 tablet Refills: 3    cetirizine (ZYRTEC) 10 MG tablet  TAKE 1  TABLET BY MOUTH EVERY DAY IN THE MORNING  Qty: 90 tablet Refills: 3  Associated Diagnoses:Acute cough; COPD with acute exacerbation (HCC)    risedronate (ACTONEL) 150 MG tablet  Take 1 tablet by mouth every 30 (thirty) days with full glass of water on empty stomach-nothing else by mouth and do not lie down for 1/2 hour  Qty: 3 tablet Refills: 3    hydrocortisone 2.5 % cream  Apply topically 2 (two) times daily To affected areas of scalp and ears  Qty: 60 g Refills: 1    VITAMIN D3 50 MCG (2000 UT) TABS tablet  TAKE 1 TABLET BY MOUTH EVERY DAY IN THE MORNING  Qty: 90 tablet Refills: 3    atorvastatin (LIPITOR) 80 MG tablet  Take 1 tablet by mouth Daily after dinner  Qty: 90 tablet Refills: 3    polyethylene glycol (GLYCOLAX/MIRALAX) 17 g packet  Take 1 packet by mouth in the morning.  Qty: 90 packet Refills: 3  Comments: Do not use healthylax which causes diarrhea.  Miralax and other generics are OK  Associated Diagnoses:Drug-induced constipation    docusate sodium (COLACE) 100 MG capsule  Take 1 capsule by mouth in the morning and 1 capsule before bedtime.  Qty: 180 capsule Refills: 3    aspirin 81 MG chewable tablet  Take 1 tablet by mouth in the morning.  Qty: 90 tablet Refills: 3    bisacodyl (DULCOLAX) 5 MG EC tablet  Take 8 tablets by mouth See Admin Instructions Take as directed prior to colonoscopy  Qty: 8 tablet Refills: 0    albuterol HFA 108 (90 Base) MCG/ACT inhaler  INHALE 2 PUFFS INTO THE LUNGS EVERY 6 HOURS AS NEEDED FOR WHEEZING OR SHORTNESS OF BREATH  Qty: 8.5 g Refills: 11  Comments: OK to switch to covered brand alternative  Associated Diagnoses:Chronic obstructive pulmonary disease, unspecified COPD type (HCC)    ipratropium-albuterol (DUO-NEB) 0.5-2.5 (3) MG/3ML SOLN Inhalation Solution  Take 3 mLs by nebulization 4 (four) times daily.  Qty: 180 mL Refills: 0      STOP taking these medications    naproxen (NAPROSYN) 500 MG tablet  Comments:  Reason for Stopping:      - You had a right total knee  replacement with Dr Sallee Lange on 03/11/23  - You may put as much weight on your operative leg as you can tolerate. Use your walker to help you walk.   - Keep your incision covered. The Optifoam dressing should be replaced with a new Optifoam on POD#7, 03/18/23.  If the dressing comes off accidentally, cover the incision with gauze. Do not get the incision wet.   - You may shower with Optifoam  in place. Do not submerge the dressing. Keep the dressing clean and dry.  - You can remove the second Optifoam 2 weeks after surgery, 03/25/23.  You can then leave the incision open to air and can get the incision wet 2 weeks after surgery.  - You do not need to have any stitches removed.  They are buried under the skin and will dissolve over time.  - You will be on Eliquis, a blood thinner, for 6 weeks postop to prevent blood clots. Your last dose will be on 04/22/23. Stay on your normal ASA during this time.   - You have been given a prescription for narcotic pain medication. Please take as directed and taper down use as tolerated. Drink plenty of water and take Colace (stool softener) to help prevent constipation. You may take Senna (laxative) nightly as well.  - Attend your physical therapy and orthopaedic follow-up appointments.  - If you need a refill of your pain medication, please call 48 hours in advance. All requests for medication refills will be addressed within 48 hours. No prescription refills will be written after hours (5pm-8:30am) or on the weekend.  - Please call the orthopaedic clinic at [phone] if you have increased pain, numbness/tingling, drainage from your incision, fevers, chills, nausea/vomiting, if you have a fall, or have any other questions/concerns. Please go to the nearest emergency room if you experience chest pain or shortness of breath.     Activity: activity as tolerated  Diet: regular diet  Wound Care: as directed    [place] Future Appointments:  Current and Future Appointments at [facility] (90 Days) 03/12/2023 - 06/10/2023      The maximum number of appointments has been reached.        Date Visit Type Length Department Provider     03/16/2023  3:00 PM PRIORITY THERAPY EVAL 60 60 min [place] [facility] [[id number]] Myer Haff, PT    Patient Instructions:                   03/24/2023 10:30 AM TELEVISIT GROUP PC 60 min [place] [facility] [[id number]] Horatio Pel, MD    Patient Instructions:     This group will take place by video or in some cases by phone. You will receive an email with instructions on how to join this visit by Google Meet prior to the appointment.                03/25/2023 11:00 AM ORTHO POST OP 15 15 min [facility] - [facility] - Orthopedics [[id number]] Gabriel Rainwater, MD    Patient Instructions:                   04/07/2023 10:30 AM TELEVISIT GROUP PC 60 min [place] [facility] - [facility] [[id number]] Azzie Glatter, MD    Patient Instructions:     This group will take place by video or in some cases by phone. You will receive an email with instructions on how to join this visit by Google Meet prior to the appointment.                04/21/2023 10:30 AM TELEVISIT GROUP PC 60 min [place]  [facility] [[id number]] Boneta Lucks, [place]    Patient Instructions:     This group will take place by video or in some cases by phone. You  will receive an email with instructions on how to join this visit by Google Meet prior to the appointment.                       Follow-up with:  Follow-up Information       Follow up With Specialties Details Why Contact Info    [facility]   [address]  [place] [address]  [phone]          Code Status during hospital stay: Full Code      Signed:  Rebecca Eaton, PA-C  03/12/2023  12:40 PM

## 2023-03-11 NOTE — Anesthesia Procedure Notes (Signed)
Spinal Block    Patient location during procedure: OR  Start time: 03/11/2023 8:05 AM  End time: 03/11/2023 8:11 AM    Performed by: Carmon Sails, CRNA  Authorized by: Rod Mae, MD      Reason for block: procedure for pain, at surgeon's request and post-op pain management    Preanesthetic Checklist  Completed: patient identified, IV checked, site marked, risks and benefits discussed, surgical consent, monitors and equipment checked, pre-op evaluation and timeout performed  Spinal Block  Patient position: sitting  Prep: ChloraPrep  Patient monitoring: heart rate, cardiac monitor and continuous pulse ox  Approach: midline  Location: L4-5  Injection technique: single-shot  Needle  Needle type: [place]   Needle gauge: 25 G  Needle length: 3.5 in    Medications Administered  bupivacaine PF (MARCAINE PF) injection 0.5 % - Intrathecal, Back   2.4 mL - 03/11/2023 8:11:00 AM    Assessment  Paresthesia: none  Additional Notes  Scoliosis, lumbar spine exaggerates to the left.  Patient tolerated procedure well, VSS.

## 2023-03-11 NOTE — Progress Notes (Signed)
OUTPATIENT PSYCHIATRY GROUP PROGRESS NOTE    For the patient participating in the group via telephone and/or video technology: The patient/guardian has verbally consented to participate in this group therapy session by televisit. The patient/guardian was encouraged to be in a private location due to personal health information being discussed and where they could not be overheard by individuals not participating in the group therapy. Although all [facility] staff/providers participating in this group therapy televisit are in a private location, all the risks of telephone and/or video technology used to conduct this group therapy session cannot be controlled by [facility], (i.e. interruptions, unauthorized access/recording and technical difficulties).  Each patient understands that the visit can be stopped at any time for any reason, including if the conferencing connections are inadequate.    Cheryl Dixon is a 67 year old, Divorced, female, who presented for [facility] group visit.   Length of group: 60 min which included 30 min of Health Behavior Intervention and OUD Psychoeducation.    Group Name: OBOT: Office Based Opioid Treatment Group    Group Topic Today: Managing Holiday Stress, Triggers, and Relapse Prevention. Group members discussed strategies to manage holiday stress and triggers. Group members  discussed strategies to support successful recovery.  Group members shared strategies to reduce relapse risks and increase healthy coping mechanisms to support healthy change.    Leaders:  Horatio Pel, MD, Milta Deiters, Ph.D., and Boneta Lucks, RN    Service Type: [phone] Group Psychotherapy     Number of participants in group today:  10        Purpose of Group (choose all that apply):   Skills training/rehab  Support (psychological, family, community resources, recovery)  Insight and behavior change  Increasing health promoting behaviors  Relapse prevention and enhancing Recovery Process  Education about Opioid Use Disorder  (OUD) and treatment  Enhancing adjustment and coping with OUD  Improving management of OUD and treatment adherence  Decreasing health risk behaviors and risks of relapse  Addressing psychological/psychosocial/behavioral barriers to preventing addiction and relapse          Group Process:    Review of group goals/structure  Education about Opioid Use Disorder (OUD) and related topics  Group discussion and sharing.    Review of strategies to increase healthy coping mechanisms to support recovery and change.  Review of additional relapse prevention strategies.   Discussion of support strategies  Check in        Individual Patient Participation:    Attentive to process  Contributed constructively  Reported getting ready for knee replacement surgery. Otherwise, she is keeping busy with grandchildren.   No relapse/close calls disclosed.       Medical Necessity of Session (how treatment is necessary to improve symptoms, functioning, or prevent worsening): treatment is necessary to enhance compliance with agonist treatment and maintain abstinence from opioids. Suboxone group participation is mandatory to continue Suboxone prescription and is necessary in order to enhance coping with Opioid Use Disorder.      Relevant Changes in Mental Status: No. Mood seems euthymic; no acute issues reported or noted      Risk Level per Scale:  Suicide: re-assessment not indicated today  Violence: re-assessment not indicated today  Addiction: low     Current risk level represents increase in risk: No.       DIAGNOSES:     Opioid dependence on agonist therapy (HCC)  (primary encounter diagnosis)   SNOMED CT(R): OPIOID DEPENDENCE, ON AGONIST THERAPY     MDD (  major depressive disorder), recurrent, in partial remission (HCC)   SNOMED CT(R): RECURRENT MAJOR DEPRESSION IN PARTIAL REMISSION      REVIEWING TODAY'S VISIT:   Reviewing Today's Visit:  Clinical Interventions Today: Motivational Interviewing; Cognitive Behavioral Therapy (CBT); Relapse  Prevention for SUDs  Patient's Response to Interventions: Responsive  Time Spent in Psychotherapy: 60 min            CARE PLAN/ EPISODES:  Linked Episodes   Type: Episode: Status: Noted: Resolved: Last update: Updated by:   PCBHI PCBHI OBOT  Active 09/11/2020  03/10/2023 10:57 AM Milta Deiters, PhD      Comments:       Patient/Guardian:       Strengths/Skills: Help seeking and motivated to get better     Potential Barriers: Multiple life stressors       Patient stated goals: Pt would like to prevent relapses and support her recovery process     Problem 1: Opioid Dependence on Agonist Therapy (Opioid Use Disorder on Agonist Therapy)      Short Term Goals: Pt will use at least one copying skill a day to reduce a risk of relapse, enhance coping with OUD, and support her recovery process     Short Term Target:  60 days  Short TermTarget Date: 03/14/2023  Short Term Goal #1 Progress:       Long Term Goals: Pt will report 50% in urges and cravings reduction and no relapses or close calls       Long Term Target:  90 days  Long TermTarget Date:  04/13/2023   Long Term Goal #1 Progress:         Intervention #1: PCBHI-based OBOT/Group Psychotherapy   Intervention Frequency:  biweekly  Intervention Duration:  90 Days  Intervention Responsibility:  J. Maryann Conners, PhD and the OBOT team                                                                                                                                                                                                                                    Milta Deiters, PhD

## 2023-03-12 DIAGNOSIS — Z96651 Presence of right artificial knee joint: Secondary | ICD-10-CM | POA: Diagnosis not present

## 2023-03-12 LAB — BASIC METABOLIC PANEL
ANION GAP: 9 mmol/L — ABNORMAL LOW (ref 10–22)
BUN (UREA NITROGEN): 15 mg/dL (ref 7–18)
CALCIUM: 8.3 mg/dL — ABNORMAL LOW (ref 8.5–10.5)
CARBON DIOXIDE: 26 mmol/L (ref 21–32)
CHLORIDE: 103 mmol/L (ref 98–107)
CREATININE: 0.9 mg/dL (ref 0.4–1.2)
ESTIMATED GLOMERULAR FILT RATE: >60 mL/min (ref >60)
Glucose Random: 117 mg/dL (ref 74–160)
POTASSIUM: 4 mmol/L (ref 3.5–5.1)
SODIUM: 138 mmol/L (ref 136–145)

## 2023-03-12 LAB — CBC WITH PLATELET
ABSOLUTE NRBC COUNT: 0 10*3/uL (ref 0.0–0.0)
HEMATOCRIT: 35.6 % (ref 34.1–44.9)
HEMOGLOBIN: 11.1 g/dL — ABNORMAL LOW (ref 11.2–15.7)
MEAN CORP HGB CONC: 31.2 g/dL (ref 31.0–37.0)
MEAN CORPUSCULAR HGB: 28 pg (ref 26.0–34.0)
MEAN CORPUSCULAR VOL: 89.9 fL (ref 80.0–100.0)
MEAN PLATELET VOLUME: 9.5 fL (ref 8.7–12.5)
NRBC %: 0 % (ref 0.0–0.0)
PLATELET COUNT: 214 10*3/uL (ref 150–400)
RBC DISTRIBUTION WIDTH STD DEV: 46.5 fL — ABNORMAL HIGH (ref 35.1–46.3)
RED BLOOD CELL COUNT: 3.96 M/uL (ref 3.90–5.20)
WHITE BLOOD CELL COUNT: 9.7 10*3/uL (ref 4.0–11.0)

## 2023-03-12 MED ORDER — OXYCODONE HCL 5 MG PO TABS
5.0000 mg | ORAL_TABLET | ORAL | 0 refills | Status: DC | PRN
Start: 2023-03-12 — End: 2023-03-19

## 2023-03-12 MED ORDER — ACETAMINOPHEN 500 MG PO TABS
1000.0000 mg | ORAL_TABLET | Freq: Three times a day (TID) | ORAL | 0 refills | Status: DC | PRN
Start: 2023-03-12 — End: 2023-03-25

## 2023-03-12 MED ORDER — APIXABAN 2.5 MG PO TABS
2.5000 mg | ORAL_TABLET | Freq: Two times a day (BID) | ORAL | 1 refills | Status: AC
Start: 2023-03-12 — End: 2023-04-23

## 2023-03-12 MED ORDER — PANTOPRAZOLE SODIUM 40 MG PO TBEC
40.0000 mg | DELAYED_RELEASE_TABLET | Freq: Every day | ORAL | 0 refills | Status: AC
Start: 2023-03-13 — End: 2023-04-13

## 2023-03-12 NOTE — Plan of Care (Signed)
A&Ox3, denies SOB/CP/dizziness/n/v. VSS, afebrile. LS clear. Tolerates diet. Neiro checks performed to RLE, PPP and CSM. Dressing CDI. Cryocuff in place. Call light placed within reach. Will continue to monitor.   Problem: Activity:  Goal: Mobility will be supported throughout the hospitalization,  Outcome: Progressing     Problem: Cognitive:  Goal: Caregivers and patients knowledge of risk factors and measures for prevention of condition will be supported throughout the hospitalization  Outcome: Progressing     Problem: Fall risk identification/communication (REQUIRED)  Description: REQUIRED if Fall Risk score is 5 or greater  Goal: Patients at risk for falling will be known to staff  Description: Fall risk assessment to be done:       Upon Admission       Transfer from one level of care to another       Whenever there is a change in patient status       After every fall event   Outcome: Progressing     Problem: Pain  Goal: Patient's pain/discomfort is manageable  Description: Assess and monitor patient's pain using appropriate pain scale. Collaborate with interdisciplinary team and initiate plan and interventions as ordered. Re-assess patient's pain level 30 - 60 minutes after pain management intervention.   Outcome: Progressing

## 2023-03-12 NOTE — Discharge Instr - Activity (Signed)
Activity as tolerated  WBAT RLE

## 2023-03-12 NOTE — Discharge Instr - Diet (Addendum)
Diet: Regular home diet

## 2023-03-12 NOTE — Plan of Care (Signed)
Problem: Safety:  Goal: Will remain free from falls throughout the hospitalization,  Outcome: Progressing   Bed in low position, call light within reach, pt instructed to call for assistance      Problem: Pain  Goal: Patient's pain/discomfort is manageable  Description: Assess and monitor patient's pain using appropriate pain scale. Collaborate with interdisciplinary team and initiate plan and interventions as ordered. Re-assess patient's pain level 30 - 60 minutes after pain management intervention.   Outcome: Progressing   Pt c/o pain to right knee med with scheduled Toradol and Tylenol at 2209, and Oxycodone at 2315    A+Ox4, c/o tingling below knee, LS clear, +pp/csm no edema, voided in bathroom indep with walker gait steady, tol diet denies nv abd lg soft non tender +bsx4 last bm 11/13, RLE ace wrap with cryo cuff in place,#20 IV HL left, pt c/o pain 7-9/10 med with scheduled Toradol and Tylenol and Oxycodone prn, falls precautions maintained, pt instructed to call for assistance

## 2023-03-12 NOTE — Progress Notes (Signed)
Inpatient Physical Therapy Treatment Note  S: Pt awake and agreeable to PT.  O: Please see Vital Sign and Pain Assessment flowsheets for vitals and pain documentation.  Plan of care has been reviewed and updated as necessary.     03/12/23 1610   Language Information   Language Needs Met By: Lenox Ponds Used Effectively By Patient   Rehab Discipline   Rehab Discipline PT   Weight Bearing Status   RLE WBAT   LLE FWB   RUE FWB   LUE FWB   Mobility / Balance   Mobility / Balance Yes   Wt Bearing Compliance Yes   Bed Mobility   Supine to Sit Independent   Sit to Supine Independent   Transfers   Transfer Yes   Transfer 1   Transfer From 1 Sit   Transfer Type 1 To and from   Transfer to 1 Stand   Technique 1 Sit to stand;Stand to sit   Transfer Device 1 Rolling walker (Front wheel walker)   Transfer Level of Assistance 1 Independent   Trials/Comments 1 1   Gait   Gait Yes   Gait 1   Assistive Device 1 Rolling walker (Front wheel walker)   Pattern 1 R Decreased stance time;Decreased stride length  (step-to)   Gait Assistance 1 Supervision   Distance (Ft) 1 100 Feet   Activity Tolerance   Activity Tolerance Endurance does not limit participation   Balance   Sitting - Static Sits without support for > 30 sec;Feet unsupported   Sitting - Dynamic No upper extremity supported;Feet unsupported;Moves/returns truncal midpoint 1-2 inches in multiple planes   Standing - Static No upper extremity supported;Able to maintain 60 sec   Standing - Dynamic Forward lean   Posture Rounded shoulders   Ther Exercise   Therapeutic Exercise? No   Coordination   Gross Motor Performance Observation Stiff   Safety Devices   Type of Devices Call bell in place   Plan   Prognosis Good   PT Frequency 7x/wk   Recommendation   Recommendation Home PT   Equipment Recommended Rolling walker (Front wheel walker)       A: Pt alert and cooperative. Pt reporting she has been getting up and going to the bathroom herself overnight for 6 times and able to navigate door  management w/o assistance. Pt performing bed mobility and functional transfers w/ RW independently and safely. Pt able to maintain good standing balance w/o UE support. Pt ambulating w/ RW in hall w/ supervision for ~100 ft, presenting slow, steady step-to gait, and no LOB. Pt has great awareness of safety. Pt tolerated tx session well and will continue to benefit from acute skilled PT during her admission to return to highest level of function.   P: Continue per PT Care Plan. Recommend home PT w/ RW (pt does not have a RW at home) upon d/c.       Linde Gillis, PT/s

## 2023-03-12 NOTE — Discharge Inst - Page 1 Instructions (Addendum)
VNA Instructions: VNA INSTRUCTIONS: As directed.

## 2023-03-12 NOTE — RN Shift Note (Signed)
All paperwork discussed / pt. IV removed. Discharged w/ all belongings via wheelchair.

## 2023-03-12 NOTE — Discharge Inst - Wound Care (Addendum)
Wound or Skin Care:   - Keep your incision covered. The Optifoam dressing should be replaced with a new Optifoam on POD#7, 03/18/23.  If the dressing comes off accidentally, cover the incision with gauze. Do not get the incision wet.   - You may shower with Optifoam in place. Do not submerge the dressing. Keep the dressing clean and dry.  - You can remove the second Optifoam 2 weeks after surgery, 03/25/23.  You can then leave the incision open to air and can get the incision wet 2 weeks after surgery.  - You do not need to have any stitches removed.  They are buried under the skin and will dissolve over time.

## 2023-03-12 NOTE — Progress Notes (Signed)
Orthopedic Surgery Progress Note      POD #1 s/p right TKR    Subjective:  AVSS. She denies chest pain, shortness of breath, nausea/vomiting, dizziness or distal numbness or tingling. Her pain controlled and tolerable. No reported overnight events.    Cleared PT and ready for discharge home today.    Objective:  Medications:    aspirin  81 mg Oral Daily    atorvastatin  80 mg Oral Daily after dinner    buPROPion  200 mg Oral BID    cetirizine  10 mg Oral Daily    NON FORMULARY 2.5 mcg  2.5 mcg Inhalation BID    docusate sodium  100 mg Oral TID    sennosides  17.2 mg Oral BID    pantoprazole  40 mg Oral Daily    multivitamin  1 tablet Oral Daily    ketorolac  15 mg Intravenous Q8H SCH    [START ON 03/13/2023] celecoxib  200 mg Oral Daily    acetaminophen  1,000 mg Oral Q8H SCH    Gatorade regular   Oral Q12H    apixaban  2.5 mg Oral BID    solifenacin  10 mg Oral Daily    buprenorphine-naloxone  20 mg Sublingual Daily     Labs:  CBC:   Recent Labs     03/11/23  0705 03/12/23  0740   WBC 7.8 9.7   HCT 39.5 35.6   HGB 12.7 11.1*   PLTA 246 214   RBC 4.48 3.96     BMP:   Recent Labs     03/11/23  0705 03/12/23  0740   NA 141 138   K 4.0 4.0   CL 105 103   CO2 25 26   BUN 16 15   CREAT 0.8 0.9   GLUCOSER 115 117     Coagulation Labs: No results for input(s): "PT", "PTT", "INR" in the last 72 hours.  LFTs:   Recent Labs     03/11/23  0705   AST 15   ALT 9*   TBILI 0.4   ALKPHOS 59     Intake and Output:  I/O 24 Hrs:  In: 1800 [P.O.:1800]  Out: -     Physical Examination:  Vital Signs: BP 130/54   Pulse 68   Temp 98.4 F (36.9 C) (Oral)   Resp 18   Ht 5\' 1"  (1.549 m)   Wt 89.6 kg (197 lb 8 oz)   LMP 11/20/1991   SpO2 92%   BMI 37.32 kg/m   Gen: Alert and oriented x 3. No acute distress.  HEENT: Atraumatic, normocephalic. Conjunctivae clear. Mucous membranes moist.  Neck: Trachea midline and no noticeable JVD  Pulm: Non-labored breathing and does not appear short of breath  Musculoskeletal: Right TKR: Optifoam and  Ace dressing clean/dry/intact. No new erythema, ecchymosis, or drainage. Thigh and calf soft and non-tender. Gross motor and sensation intact. Can dorsi and planter flex feet. Toes warm and well perfused. 2+ DP  Neuro: Normal mentation and speech.    Assessment/Plan:  This is a 67 year old year old English speaking female who is POD 1 s/p right TKR. Doing well from Orthopedic perspective.  # Wound - Keep Optifoam dressing in place until your follow up visit. Nursing may reinforce dressing if it becomes saturated.   # Heme - Hct 35.6, asymptomatic, continue to monitor  # Analgesia - Multi-modal pain regimen and ice as tolerated  # Activity - As tolerated, WBAT  RLE.  Please use your walker as directed by PT/OT.  # Anticoagulation - Eliquis 2.5mg  twice daily x6 weeks,   # Bowels: Encourage fluid intake. Bowel meds as directed  # Prophalaxis - Encourage IS, Venodynes (calf/ankle) and OOB with PT as tolerated  # Followup - Orthopedic outpatient clinic 03/25/2023 with Dr. Sallee Lange.  # Dispo - Home w/services today . PT/OT will collaborate to determine a safe discharge plan.    I spent a total of 60 minutes on this visit on the date of service (total time includes all activities performed on the date of service).     I reviewed the likely diagnosis, the prognosis, and various treatment options in detail. Risks and benefits of treatment plan discussed. The patient's questions have been answered, and the patient understands agrees with treatment plan.     Please page the Orthopedic On-call person in StaffNet with questions.       Rebecca Eaton, PA-C  Beeper [phone]

## 2023-03-12 NOTE — Discharge Inst - Anticoagulation Mgmt (Addendum)
You are taking a blood thinner. Continue taking your baby aspirin in addition to Eliquis 2.5mg  twice daily x6 weeks    You should ask your nurse for more information on this medicine if you have any questions.

## 2023-03-12 NOTE — Page 1 Discharge - Orders (Signed)
PATIENT CARE REFERRAL FORM    Patient Demographics:           Name MRN Sex Birthdate Social Security   Cheryl Dixon [id number] Female  12-28-1955  (67 year old) [id number]   Address Home Phone Insurance ID Marital Status Religion   [address]  [place] [address] [phone] Payor: [facility] /  /  /   [id number] Divorced Catholic   PCP Admission Date Discharge Date Attending Provider Code Status   Cheryl Laser, MD 03/11/23      03/12/23 Cheryl Rainwater, MD Full Code     Primary Contacts:            Extended Emergency Contact Information  Primary Emergency Contact: [facility]  Address: [address]           [place], [place] [address] [place]  Home Phone: [phone]  Relation: Daughter    Healthcare ProxyVALEREE, Dixon  Phone Number: [phone]    Discharge:             Discharge to: [facility]  Address:    Has discharge transportation arranged?    Transportation at discharge:      Contact number after discharge:    Best time of day to contact:    Nurse/staff has permission to speak with:      Interpreter requested: No  Family notified of disposition:         Diagnosis:             Isolation:   Infections:     Patient Active Problem List:     Opioid dependence on agonist therapy (HCC)     Tobacco use disorder     Fibrocystic breast     Essential hypertension     Knee pain     Chronic low back pain     OA (osteoarthritis) of knee     H. pylori infection     Major depressive disorder, recurrent episode, severe (HCC)     Occult blood in stools     Prediabetes     Epigastric pain     Chronic obstructive pulmonary disease (HCC)     Left foot pain     Multiple polyps of colon     Acquired deformity of toe, right     Dislocation of metatarsophalangeal joint of lesser toe, right, sequela     Postmenopausal atrophic vaginitis     Abnormal loss of weight     Aneurysm of ascending aorta without rupture (HCC)     COVID-19 virus infection     BMI 32.0-32.9,adult     Chronic dysuria     LLQ pain      Osteoporosis     History of endometriosis     Lung cancer screening     Constipation, drug induced     Weakness     Cerebrovascular accident (CVA) (HCC)     Overactive bladder      [facility] [phone] Option #4     Substance addiction (HCC)     Anxiety     Obese     S/P total knee arthroplasty, right      Review of Patient's Allergies indicates:   Darvon                     Meperidine hcl              Comment:Nausea/vomit   Paxil [paroxetine]  Rash   Zoloft [sertraline *    Rash   Hospital Administered Immunizations Administered for This Admission  Never Reviewed      No immunizations on file.          Physician Order's: (Include specific orders for Diet, Labs, or Tests)       Page 1 Instructions    VNA Instructions: [facility]NA INSTRUCTIONS: As directed.           Nutrition/Diet Instructions    Diet: Regular home diet               Insulin Instructions  No orders of the defined types were placed in this encounter.    Anticoagulation Instructions    You are taking a blood thinner. Continue taking your baby aspirin in addition to Eliquis 2.5mg  twice daily x6 weeks    You should ask your nurse for more information on this medicine if you have any questions.           Current Discharge Medication List    START taking these medications    apixaban (ELIQUIS) 2.5 MG po tablet  Take 1 tablet by mouth in the morning and 1 tablet before bedtime.  Qty: 60 tablet Refills: 1    oxyCODONE (ROXICODONE) 5 MG immediate release tablet  Take 1-2 tablets by mouth every 4 (four) hours as needed Max Daily Amount: 60 mg  for up to 7 days  Qty: 30 tablet Refills: 0  Comments: Partial fill upon patient's request.    pantoprazole (PROTONIX) 40 MG tablet  Take 1 tablet by mouth daily  Qty: 30 tablet Refills: 0      CONTINUE these medications which have CHANGED    acetaminophen (TYLENOL) 500 MG tablet  Take 2 tablets by mouth every 8 (eight) hours as needed for Pain  Qty: 60 tablet Refills: 0      CONTINUE these  medications which have NOT CHANGED    buprenorphine-naloxone (SUBOXONE) 8-2 MG sublingual film  Take 2.5 films under the tongue daily.  Qty: 70 Film Refills: 0  Comments: Partial fill upon patient request.  Associated Diagnoses:Opioid dependence on agonist therapy (HCC)    tiotropium-olodaterol (STIOLTO RESPIMAT) 2.5-2.5 MCG/ACT inhal  Inhale 2 puffs into the lungs daily    famotidine (PEPCID) 20 MG tablet  TAKE 1 TABLET BY MOUTH IN THE MORNING AND BEFORE BEDTIME  Qty: 180 tablet Refills: 0  Associated Diagnoses:Epigastric pain    solifenacin (VESICARE) 10 MG tablet  Take 1 tablet by mouth in the morning.  Qty: 90 tablet Refills: 0  Comments: Requests trade name  Associated Diagnoses:Overactive bladder    buPROPion (WELLBUTRIN SR) 200 MG 12 hr tablet  TAKE 1 TABLET BY MOUTH IN THE MORNING AND BEFORE BEDTIME  Qty: 180 tablet Refills: 3    cetirizine (ZYRTEC) 10 MG tablet  TAKE 1 TABLET BY MOUTH EVERY DAY IN THE MORNING  Qty: 90 tablet Refills: 3  Associated Diagnoses:Acute cough; COPD with acute exacerbation (HCC)    risedronate (ACTONEL) 150 MG tablet  Take 1 tablet by mouth every 30 (thirty) days with full glass of water on empty stomach-nothing else by mouth and do not lie down for 1/2 hour  Qty: 3 tablet Refills: 3    hydrocortisone 2.5 % cream  Apply topically 2 (two) times daily To affected areas of scalp and ears  Qty: 60 g Refills: 1    VITAMIN D3 50 MCG (2000 UT) TABS tablet  TAKE 1 TABLET BY MOUTH EVERY DAY  IN THE MORNING  Qty: 90 tablet Refills: 3    atorvastatin (LIPITOR) 80 MG tablet  Take 1 tablet by mouth Daily after dinner  Qty: 90 tablet Refills: 3    polyethylene glycol (GLYCOLAX/MIRALAX) 17 g packet  Take 1 packet by mouth in the morning.  Qty: 90 packet Refills: 3  Comments: Do not use healthylax which causes diarrhea.  Miralax and other generics are OK  Associated Diagnoses:Drug-induced constipation    docusate sodium (COLACE) 100 MG capsule  Take 1 capsule by mouth in the morning and 1 capsule  before bedtime.  Qty: 180 capsule Refills: 3    aspirin 81 MG chewable tablet  Take 1 tablet by mouth in the morning.  Qty: 90 tablet Refills: 3    bisacodyl (DULCOLAX) 5 MG EC tablet  Take 8 tablets by mouth See Admin Instructions Take as directed prior to colonoscopy  Qty: 8 tablet Refills: 0    albuterol HFA 108 (90 Base) MCG/ACT inhaler  INHALE 2 PUFFS INTO THE LUNGS EVERY 6 HOURS AS NEEDED FOR WHEEZING OR SHORTNESS OF BREATH  Qty: 8.5 g Refills: 11  Comments: OK to switch to covered brand alternative  Associated Diagnoses:Chronic obstructive pulmonary disease, unspecified COPD type (HCC)    ipratropium-albuterol (DUO-NEB) 0.5-2.5 (3) MG/3ML SOLN Inhalation Solution  Take 3 mLs by nebulization 4 (four) times daily.  Qty: 180 mL Refills: 0      STOP taking these medications    naproxen (NAPROSYN) 500 MG tablet  Comments:  Reason for Stopping:          Palliative Care Referral  No orders of the defined types were placed in this encounter.    Hospice Referral  No orders of the defined types were placed in this encounter.    Discharge Supplies:   No discharge procedures on file.    Wound Care Instructions    Wound or Skin Care:   - Keep your incision covered. The Optifoam dressing should be replaced with a new Optifoam on POD#7, 03/18/23.  If the dressing comes off accidentally, cover the incision with gauze. Do not get the incision wet.   - You may shower with Optifoam in place. Do not submerge the dressing. Keep the dressing clean and dry.  - You can remove the second Optifoam 2 weeks after surgery, 03/25/23.  You can then leave the incision open to air and can get the incision wet 2 weeks after surgery.  - You do not need to have any stitches removed.  They are buried under the skin and will dissolve over time.           Follow-up Information       Follow up With Specialties Details Why Contact Info    [facility]   [address]  [place] [address]  [phone]           Current and Future Appointments at [facility] [phone] Days) 03/12/2023 - 06/10/2023      The maximum number of appointments has been reached.        Date Visit Type Length Department Provider     03/16/2023  3:00 PM PRIORITY THERAPY EVAL 60 60 min [facility] [[id number]] Myer Haff, PT    Patient Instructions:                   03/24/2023 10:30 AM TELEVISIT GROUP PC 60 min  Primary Care -  [facility] [[id number]] Horatio Pel, MD    Patient Instructions:     This group will take place by video or in some cases by phone. You will receive an email with instructions on how to join this visit by Google Meet prior to the appointment.                03/25/2023 11:00 AM ORTHO POST OP 15 15 min [facility] - Orthopedics [[id number]] Cheryl Rainwater, MD    Patient Instructions:                   04/07/2023 10:30 AM TELEVISIT GROUP PC 60 min [facility] [[id number]] Azzie Glatter, MD    Patient Instructions:     This group will take place by video or in some cases by phone. You will receive an email with instructions on how to join this visit by Google Meet prior to the appointment.                04/21/2023 10:30 AM TELEVISIT GROUP PC 60 min [facility] [[id number]] Boneta Lucks, [place]    Patient Instructions:     This group will take place by video or in some cases by phone. You will receive an email with instructions on how to join this visit by Google Meet prior to the appointment.                       Physical Therapy:            Activity Instructions    Activity as tolerated  WBAT RLE         Weight Bearing Status LRE: Initial Evaluation  Weight Bearing Status LLE: Full weight bearing    Frequency: 7x/wk  Recommendation: Home PT    Recommended Equipment: Equipment Recommended: Rolling walker (Front wheel walker)  Patient Stated Goals:    Prognosis: Prognosis: Good    Home Health  Services:           Home Health Services:      CERTIFICATION (When Applicable):         I certify that the above named patient is: Requires skilled nursing care on a continuing basis for any of the conditions for which he/she received care during this hospitalization.    Who will follow this patient after discharge: Cheryl Laser, MD or Dr. Sharol Roussel, MD      Electronically signed by:  Rebecca Eaton, PA-C

## 2023-03-12 NOTE — Care Coordination (Addendum)
Discussed in MDR. Chart reviewed. Pt is a 67 y/o female POD 1 s/p ARTHROPLASTY, KNEE, TOTAL. PT recs Home PT. CM met w/ the pt at bedside, she lives alone in an elevator accessible apt, 3 adult children live nearby and able to assist w/ grocery shopping. Pt baseline independent w/ ADLs/IADLs and ambulating w/ cane. No current home services, she is amenable for VNA.    Pt is medically cleared for dc today. Per Liaison Judeth Cornfield from [facility] Phone:  [phone] Email:  [email or web address], the pt is accepted for Home PT. Start of care is Most likely tomorrow. Pt will call her family members to provide transportation.    Addendum: Page 1 sent to [facility].         03/12/23 1108   Admission   Reviewed for admission Yes   Observation Notice delivered to patient No   Does patient have prescription drug coverage? Yes   Reason for Admission ARTHROPLASTY, KNEE, TOTAL   Readmission Assessment  No   Mental Status Upon Admission   Mental Status Upon Admission AO   Demographics   Demographic Information Correct Yes   Patient Status   Is the patient own decision-maker? Yes   Is a Social Work consult needed for Health Care Proxy or Guardianship? Yes   Caregiver   Does the patient have a caregiver? No   Psychosocial   Admitted From: Home   Home Home no services   Challenges Adjustment to diagnosis/injury/illness   Community Support Primary Care Provider   Primary Caretaker for Someone No   Providing self care at home? Yes   Functional Screen   Level of function on admission B   Toileting Independence   Feeding Independence   Transfers Independent   Ambulation Dependent   Mobility Devices Cane   Living Situation   Lives with Alone   Type of Residence House/apartment   Living Setting Elevator assessible apartment   Anticipated Discharge Plan   Expected Discharge Date 03/12/23   Anticipated Outcome VNA   Transportation at Discharge Family   Patient expects to be discharged to: Home   Interpreter Requested No   Home Care  Services No   Home Living   Home Equipment [place]

## 2023-03-16 ENCOUNTER — Encounter (HOSPITAL_BASED_OUTPATIENT_CLINIC_OR_DEPARTMENT_OTHER): Payer: No Typology Code available for payment source | Admitting: Rehabilitative and Restorative Service Providers"

## 2023-03-17 ENCOUNTER — Telehealth (HOSPITAL_BASED_OUTPATIENT_CLINIC_OR_DEPARTMENT_OTHER): Payer: Self-pay

## 2023-03-17 ENCOUNTER — Telehealth (HOSPITAL_BASED_OUTPATIENT_CLINIC_OR_DEPARTMENT_OTHER): Payer: Self-pay | Admitting: Registered Nurse

## 2023-03-17 NOTE — Telephone Encounter (Addendum)
I called pt back to discuss her  refill  Of Oxycodone  Taking  1 every 4 hours with Tylenol   Is working with Physical therapy    I let pt know   her refill is not due until 11/21 or after    Pt stating she is almost out    Message sent to Dr Sallee Lange               ----- Message from Arco K sent at 03/17/2023 10:39 AM EST -----  Regarding: Sallee Lange - R.x  Pt called to refill oxyCODONE (ROXICODONE) 5 MG immediate release tablet [[id number]]. Please send refill.    Ph[facility]     [facility]  [address] [place], [place] [address]  Phone [phone]  Fax [phone]

## 2023-03-17 NOTE — Telephone Encounter (Signed)
Pt called to refill oxyCODONE (ROXICODONE) 5 MG immediate release tablet [[id number]]. Please send refill.    Pharmacy     [facility]  [address] [place], [place] [address]  Phone [phone]  Fax [phone]

## 2023-03-18 ENCOUNTER — Telehealth (HOSPITAL_BASED_OUTPATIENT_CLINIC_OR_DEPARTMENT_OTHER): Payer: Self-pay

## 2023-03-18 NOTE — Telephone Encounter (Signed)
Pt called requesting oxycodone refill today, rather than tomorrow, 11/21. Per [place], Pt advised refill going in today for 11/21 pickup. Pt affirmed understanding w/no other questions.

## 2023-03-18 NOTE — Telephone Encounter (Addendum)
11/21  Refill request for Oxycodone  Message sent to Dr Sallee Lange                         Pt was informed at the time of her call  That the refill is too early  And will need to wait until  11/21             Gabriel Rainwater, MD  Donette Larry, RN  Caller: Unspecified (Yesterday, 11:26 AM)  The patient should contact the office tomorrow if she is still in need of a refill for oxycodone.  The current prescription provided medication for pain through March 19, 2023.  Thanks.  Bill                  3rd call for a refill  Message sent to Dr Sallee Lange           ----- Message from Acie Fredrickson sent at 03/18/2023 11:12 AM EST -----  Regarding: DOHERTY r/x  Contact: [phone]  Pt called looking for a refill of r/x oxyCODONE (ROXICODONE) 5 MG immediate release tablet [[id number]  Please send to:  [id number] [place] [place] - [address] [[id number]]    Please call pt and ADVISE

## 2023-03-18 NOTE — Telephone Encounter (Signed)
-----   Message from Gwynneth Albright sent at 03/18/2023  9:08 AM EST -----  Regarding: doherty patient-  Contact: [phone]  S/p total knee 03/11/23 needs refill of oxyCODONE

## 2023-03-18 NOTE — Telephone Encounter (Signed)
Patient called for refill on Oxycodone  She was told it is too early  She will need to wait until 03/19/23

## 2023-03-19 ENCOUNTER — Other Ambulatory Visit (HOSPITAL_BASED_OUTPATIENT_CLINIC_OR_DEPARTMENT_OTHER): Payer: Self-pay | Admitting: Pediatrics

## 2023-03-19 ENCOUNTER — Telehealth (HOSPITAL_BASED_OUTPATIENT_CLINIC_OR_DEPARTMENT_OTHER): Payer: Self-pay | Admitting: Licensed Practical Nurse

## 2023-03-19 ENCOUNTER — Other Ambulatory Visit (HOSPITAL_BASED_OUTPATIENT_CLINIC_OR_DEPARTMENT_OTHER): Payer: Self-pay | Admitting: Family Medicine

## 2023-03-19 ENCOUNTER — Other Ambulatory Visit (HOSPITAL_BASED_OUTPATIENT_CLINIC_OR_DEPARTMENT_OTHER): Payer: Self-pay | Admitting: Specialist

## 2023-03-19 ENCOUNTER — Other Ambulatory Visit (HOSPITAL_BASED_OUTPATIENT_CLINIC_OR_DEPARTMENT_OTHER): Payer: Self-pay | Admitting: Internal Medicine

## 2023-03-19 DIAGNOSIS — N952 Postmenopausal atrophic vaginitis: Secondary | ICD-10-CM

## 2023-03-19 DIAGNOSIS — N3281 Overactive bladder: Secondary | ICD-10-CM

## 2023-03-19 DIAGNOSIS — R1013 Epigastric pain: Secondary | ICD-10-CM

## 2023-03-19 LAB — SURGICAL PATH SPECIMEN BONE

## 2023-03-19 MED ORDER — OXYCODONE HCL 5 MG PO TABS
5.0000 mg | ORAL_TABLET | Freq: Four times a day (QID) | ORAL | 0 refills | Status: AC | PRN
Start: 2023-03-19 — End: 2023-03-24

## 2023-03-19 NOTE — Telephone Encounter (Signed)
I called pharmacy and confirmed OK for her to take oxycodone temporarily in addition to suboxone, for short-term use for postoperative pain.

## 2023-03-19 NOTE — Telephone Encounter (Signed)
-----   Message from Acie Fredrickson sent at 03/19/2023 11:07 AM EST -----  Regarding: DOHERTY r/x  Contact: [phone]  Pt called looking for a refill of r/x oxyCODONE (ROXICODONE) 5 MG immediate release tablet [[id number]]. Please send to: [facility] [place] - [address]  [address] [place] [address]  Phone: [phone]  Fax: [phone]       Please call and ADVISE pt

## 2023-03-19 NOTE — Telephone Encounter (Signed)
-----   Message from Acie Fredrickson sent at 03/19/2023  3:38 PM EST -----  Regarding: DOHERTY r/x  Call from [facility] pharmacy. Calling to inform DOHERTY that pt takes subuxone. Picked up a month supply of suboxone on 03/17/23. Needs acknowledgement from the doctor to refill r/x and go over plan of care. 2 conflicting mediations. In order for pt to pick up r/x oxyCODONE (ROXICODONE) 5 MG immediate release tablet [[id number]]. Jan needs to speak with [place] OR PA. Please call and advise extension [phone]

## 2023-03-20 NOTE — Telephone Encounter (Signed)
PER Pharmacy, Cheryl Dixon is a 67 year old female has requested a refill of   solifenacin (VESICARE) 10 MG tablet .    Last Office Visit: 03/10/2023    Other Med Adult:  Most Recent BP Reading(s)  03/12/23 : 120/77        Cholesterol (mg/dL)   Date Value   16/01/9603 212     LOW DENSITY LIPOPROTEIN DIRECT (mg/dL)   Date Value   54/12/8117 151     HIGH DENSITY LIPOPROTEIN (mg/dL)   Date Value   14/78/2956 47     TRIGLYCERIDES (mg/dL)   Date Value   21/30/8657 119         THYROID SCREEN TSH REFLEX FT4 (uIU/mL)   Date Value   08/20/2022 4.100         No results found for: "TSH"    HEMOGLOBIN A1C (%)   Date Value   08/20/2022 5.6       No results found for: "POCA1C"      INR (no units)   Date Value   12/12/2021 1.1   02/16/2007 1.0 (L)   07/03/2006 < 1.0 (L)       SODIUM (mmol/L)   Date Value   03/12/2023 138       POTASSIUM (mmol/L)   Date Value   03/12/2023 4.0           CREATININE (mg/dL)   Date Value   84/69/6295 0.9       Documented patient preferred pharmacies:    [id number] [place], [place] - [address]  Phone: [phone] Fax: [phone]

## 2023-03-20 NOTE — Telephone Encounter (Signed)
PER Patient (self), Cheryl Dixon is a 67 year old female has requested a refill of   famotidine (PEPCID) 20 MG tablet  .    Last Office Visit: 03/10/2023    Other Med Adult:  Most Recent BP Reading(s)  03/12/23 : 120/77        Cholesterol (mg/dL)   Date Value   30/86/5784 212     LOW DENSITY LIPOPROTEIN DIRECT (mg/dL)   Date Value   69/62/9528 151     HIGH DENSITY LIPOPROTEIN (mg/dL)   Date Value   41/32/4401 47     TRIGLYCERIDES (mg/dL)   Date Value   02/72/5366 119         THYROID SCREEN TSH REFLEX FT4 (uIU/mL)   Date Value   08/20/2022 4.100         No results found for: "TSH"    HEMOGLOBIN A1C (%)   Date Value   08/20/2022 5.6       No results found for: "POCA1C"      INR (no units)   Date Value   12/12/2021 1.1   02/16/2007 1.0 (L)   07/03/2006 < 1.0 (L)       SODIUM (mmol/L)   Date Value   03/12/2023 138       POTASSIUM (mmol/L)   Date Value   03/12/2023 4.0           CREATININE (mg/dL)   Date Value   44/06/4740 0.9       Documented patient preferred pharmacies:    [id number] [place], [place] - [address]  Phone: [phone] Fax: [phone]

## 2023-03-20 NOTE — Telephone Encounter (Signed)
PER Pharmacy, Cheryl Dixon is a 67 year old female has requested a refill of      -  hydrocortisone, estradiol       Last Office Visit: 03/10/23 with [place]Miller, C.       There are no preventive care reminders to display for this patient.     Other Med Adult:  Most Recent BP Reading(s)  03/12/23 : 120/77        Cholesterol (mg/dL)   Date Value   16/01/9603 212     LOW DENSITY LIPOPROTEIN DIRECT (mg/dL)   Date Value   54/12/8117 151     HIGH DENSITY LIPOPROTEIN (mg/dL)   Date Value   14/78/2956 47     TRIGLYCERIDES (mg/dL)   Date Value   21/30/8657 119         THYROID SCREEN TSH REFLEX FT4 (uIU/mL)   Date Value   08/20/2022 4.100         No results found for: "TSH"    HEMOGLOBIN A1C (%)   Date Value   08/20/2022 5.6       No results found for: "POCA1C"      INR (no units)   Date Value   12/12/2021 1.1   02/16/2007 1.0 (L)   07/03/2006 < 1.0 (L)       SODIUM (mmol/L)   Date Value   03/12/2023 138       POTASSIUM (mmol/L)   Date Value   03/12/2023 4.0           CREATININE (mg/dL)   Date Value   84/69/6295 0.9        Documented patient preferred pharmacies:    [id number] [place], [place] - [address]  Phone: [phone] Fax: [phone]

## 2023-03-24 ENCOUNTER — Telehealth (HOSPITAL_BASED_OUTPATIENT_CLINIC_OR_DEPARTMENT_OTHER): Payer: Self-pay | Admitting: Registered Nurse

## 2023-03-24 ENCOUNTER — Encounter (HOSPITAL_BASED_OUTPATIENT_CLINIC_OR_DEPARTMENT_OTHER): Payer: Self-pay | Admitting: Registered Nurse

## 2023-03-24 ENCOUNTER — Ambulatory Visit: Payer: 59 | Admitting: Family Medicine

## 2023-03-24 NOTE — Progress Notes (Signed)
 Chart review complete  OUD medication reviewed  BSAS documentation complete and utd

## 2023-03-24 NOTE — Telephone Encounter (Addendum)
I called pt back to discuss her concerns    Pt has an apt 11/27 with Dr Sallee Lange    No answer     message regarding " Pull sensation "    I suggested to pt to apply ice to the site  And discuss with Dr Sallee Lange at f/u appt  11/27              ----- Message from Acie Fredrickson sent at 03/24/2023  9:55 AM EST -----  Regarding: Cheryl Dixon pt  Contact: [phone]  Pt called had SURGERY 03/11/23 for ARTHROPLASTY, KNEE, TOTAL [38756 (CPT)]. Pt reports pain in the Top of my knee cap. Pt says went to get up this morning felt something pull. Not sure what went wrong. Pain is above the scar on my knee, very painful. Please call and ADVISE pt

## 2023-03-25 ENCOUNTER — Ambulatory Visit: Payer: No Typology Code available for payment source | Attending: Physician Assistant | Admitting: Specialist

## 2023-03-25 ENCOUNTER — Other Ambulatory Visit: Payer: Self-pay

## 2023-03-25 DIAGNOSIS — Z96651 Presence of right artificial knee joint: Secondary | ICD-10-CM | POA: Insufficient documentation

## 2023-03-25 DIAGNOSIS — M1711 Unilateral primary osteoarthritis, right knee: Secondary | ICD-10-CM | POA: Insufficient documentation

## 2023-03-25 MED ORDER — ACETAMINOPHEN 500 MG PO TABS
1000.0000 mg | ORAL_TABLET | Freq: Three times a day (TID) | ORAL | 0 refills | Status: AC | PRN
Start: 2023-03-25 — End: 2023-04-24

## 2023-03-25 MED ORDER — OXYCODONE HCL 5 MG PO TABS
5.0000 mg | ORAL_TABLET | Freq: Three times a day (TID) | ORAL | 0 refills | Status: DC | PRN
Start: 2023-03-25 — End: 2023-04-01

## 2023-03-25 MED ORDER — CELECOXIB 200 MG PO CAPS
200.0000 mg | ORAL_CAPSULE | Freq: Two times a day (BID) | ORAL | 0 refills | Status: DC
Start: 2023-03-25 — End: 2023-04-06

## 2023-03-25 NOTE — Progress Notes (Signed)
This patient returns regarding recent right knee surgery.  The patient is now about 2 weeks status post total knee arthroplasty, right.    Since surgery she notes gradual improvement relative to her knee symptoms.  She notes gradual lessening of pain.  She generally has more pain towards the end of the day as she retires to bed and related to activity such as doing her exercises.  She has been using Tylenol and oxycodone for pain control.  She inquired as to whether she could restart her Naprosyn.    She is receiving home care services through the [facility].  She is following through with home exercises, as well.    She has had no fever no chills.  Otherwise feeling well with no change in health.    On exam the right knee wound is healed well.  No surrounding erythema or untoward swelling.  She can actively extend the knee to just about full extension.  She takes good resistance to extension.  Normal alignment of the knee she flexes actively to 85 degrees.  Calf is benign.  No pain with passive motion.  No instability.  She walks comfortably with a walker.  Normal foot progression angle.  Somewhat shortened stride length.  No antalgia    I reviewed the x-ray findings with the patient.  The films again show a cemented total knee arthroplasty in place with all components in good position.    Overall she is progressing on target after total knee arthroplasty, right.  She will continue with her exercise regimen.  Once she is cleared to be discharged from home care she is to contact the office so outpatient PT can be arranged.  She will continue on her Eliquis.  I reviewed with the patient starting an anti-inflammatory medication to help with pain control.  She will start on Celebrex for this 14-day course.  She was advised about the potential risk of bleeding although quite low in this setting.  She should avoid Naprosyn.  Further, she will continue on extra strength Tylenol round-the-clock.  For more severe pain she will  continue with oxycodone.  She will be judicious with its use.  Advised that generally she should take a dose before bed and then half hour or so before her therapy sessions or exercise program.    Follow-up 4 weeks.

## 2023-03-26 ENCOUNTER — Emergency Department
Admission: AD | Admit: 2023-03-26 | Discharge: 2023-03-27 | Disposition: A | Discharge status: Home / Self Care | Payer: No Typology Code available for payment source | Attending: Emergency Medicine | Admitting: Emergency Medicine

## 2023-03-26 ENCOUNTER — Emergency Department (HOSPITAL_BASED_OUTPATIENT_CLINIC_OR_DEPARTMENT_OTHER): Payer: No Typology Code available for payment source

## 2023-03-26 DIAGNOSIS — M25561 Pain in right knee: Secondary | ICD-10-CM | POA: Diagnosis present

## 2023-03-26 DIAGNOSIS — Z96651 Presence of right artificial knee joint: Secondary | ICD-10-CM

## 2023-03-26 MED ORDER — MORPHINE SULFATE 4 MG/ML IV SOLN (SUPER ERX)
4.0000 mg | Freq: Once | Status: AC
Start: 2023-03-26 — End: 2023-03-26
  Administered 2023-03-26: 4 mg via INTRAVENOUS
  Filled 2023-03-26: qty 1

## 2023-03-26 MED ORDER — OXYCODONE HCL 5 MG PO TABS
5.0000 mg | ORAL_TABLET | Freq: Once | ORAL | Status: AC
Start: 2023-03-26 — End: 2023-03-26
  Administered 2023-03-26: 5 mg via ORAL
  Filled 2023-03-26: qty 1

## 2023-03-26 MED ORDER — SODIUM CHLORIDE 0.9 % MINI-BAG PLUS
2.0000 g | Freq: Once | INTRAVENOUS | Status: AC
Start: 2023-03-26 — End: 2023-03-26
  Administered 2023-03-26: 2 g via INTRAVENOUS
  Filled 2023-03-26: qty 20

## 2023-03-26 MED ORDER — CEPHALEXIN 250 MG PO CAPS
250.0000 mg | ORAL_CAPSULE | Freq: Four times a day (QID) | ORAL | 0 refills | Status: DC
Start: 2023-03-26 — End: 2023-04-01

## 2023-03-26 MED ORDER — ACETAMINOPHEN 500 MG PO TABS
1000.0000 mg | ORAL_TABLET | Freq: Once | ORAL | Status: AC
Start: 2023-03-26 — End: 2023-03-26
  Administered 2023-03-26: 1000 mg via ORAL
  Filled 2023-03-26: qty 2

## 2023-03-26 NOTE — Discharge Instructions (Addendum)
You were seen today due to severe right knee pain after your knee replacement.    We did a CT scan, and placed your knee in a knee immobilizer.  We spoke with your orthopedic surgery team, and Dr. Sallee Lange will see you tomorrow in the [facility] orthopedic clinic.  Please go to this clinic tomorrow morning, or you may receive a call in the morning to set up a close follow-up appointment.    We applied Steri-Strips to your knee wound to help keep it closed.  We are prescribing you Keflex, an antibiotic to prevent infection that you should take 4 times daily.  We are prescribing you 5 days worth of this medication, however asked Dr. Sallee Lange tomorrow how long you should take this for.    Please come back right away to the emergency department if you develop severe knee pain that does not better with your prescribed pain medications, if you develop numbness or tingling in your leg, or if you have any new concerns.  Otherwise, Dr. Sallee Lange will see you in clinic tomorrow.    Please wear your knee immobilizer at all times, you are able to bear weight and walk on it, but do not bend your knee.

## 2023-03-26 NOTE — Narrator Note (Signed)
Pt placed on bedpan again as requested
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
## 2023-03-26 NOTE — Narrator Note (Signed)
Pt transported to CT scan via stretcher

## 2023-03-26 NOTE — Narrator Note (Signed)
Pt medicated as ordered; placed patient on bedpan

## 2023-03-26 NOTE — ED Triage Note (Signed)
Cheryl Dixon BIBA from home with reports of severe right knee pain.     Pt had knee replacement surgery 2 weeks ago at this hospital. Pt has been recovering well, able to walk without issue/walk up stairs.     Pt states she stood up from a low chair this evening, felt her knee crack, experienced severe pain, started to bleed from surgical incision.     Last dose tylenol and oxycodone at 5pm.

## 2023-03-26 NOTE — Narrator Note (Signed)
Pt to xray

## 2023-03-27 ENCOUNTER — Other Ambulatory Visit: Payer: Self-pay

## 2023-03-27 ENCOUNTER — Telehealth (HOSPITAL_BASED_OUTPATIENT_CLINIC_OR_DEPARTMENT_OTHER): Payer: Self-pay | Admitting: Licensed Practical Nurse

## 2023-03-27 ENCOUNTER — Ambulatory Visit: Payer: No Typology Code available for payment source | Attending: Specialist | Admitting: Specialist

## 2023-03-27 DIAGNOSIS — Z96651 Presence of right artificial knee joint: Secondary | ICD-10-CM | POA: Diagnosis present

## 2023-03-27 DIAGNOSIS — S86811A Strain of other muscle(s) and tendon(s) at lower leg level, right leg, initial encounter: Secondary | ICD-10-CM | POA: Insufficient documentation

## 2023-03-27 NOTE — Progress Notes (Signed)
03/27/2023  12:25 PM    CHG wipes given to patient by Cortlynn Hollinsworth, LPN  under the direction of Dr. Sallee Lange  Directions in [place] given and reviewed with patient  prior to surgery  Patient informed of number to call at clinic  with any questions.     One ensure pre-surgery drink provided

## 2023-03-27 NOTE — H&P (Signed)
- ORTHOPEDIC SURGERY ADMISSION H&P NOTE -    Chief Complaint:  Right leg patella tendon rupture 16 days s/p right TKA       HPI: Cheryl Dixon is a pleasant 67 year old woman who is 16 days status post right TKA.  Her surgery was on 03/11/2023 she remained in the hospital overnight for pain management and was discharged on 03/12/2023.  Patient is on Suboxone which is managed by her PCP.      She was recently seen in the clinic postoperatively 2 days ago and was doing well.  Unfortunately yesterday evening while the patient was maneuvering to sit into a low profile chair she experienced onset of acute severe pain along with bleeding from the incision site.  She was seen in the emergency department and reported to have inability to lift her leg off the stretcher resulting in concern for a patella tendon rupture.    EMR reviewed    Past Medical History:   Past Medical History:  No date: Anxiety  No date: Arthritis  No date: COPD (chronic obstructive pulmonary disease) (HCC)  No date: Depression  No date: Obese  No date: Substance addiction (HCC)  No date: Varicose veins of lower extremities    Past Surgical History:   Past Surgical History:  2023: COLONOSCOPY  No date: ENDOMETRIAL ABLTJ THERMAL W/O HYSTEROSCOPIC GID  No date: PR ANES HRNA REPAIR UPR ABD TABDL RPR DIPHRG HRNA  No date: PR ANES IPER LOWER ABD W/LAPS RAD HYSTERECTOMY  No date: RPR 1ST INGUN HRNA PRETERM INFT [facility]DC    Allergies:   Review of Patient's Allergies indicates:   Darvon                     Meperidine hcl              Comment:Nausea/vomit   Paxil [paroxetine]      Rash   Zoloft [sertraline *    Rash    Medications prior to Admission:   cephALEXin (KEFLEX) 250 MG capsule, Take 1 capsule by mouth in the morning and 1 capsule at noon and 1 capsule in the evening and 1 capsule before bedtime. Do all this for 5 days., Disp: 20 capsule, Rfl: 0  celecoxib (CELEBREX) 200 MG capsule, Take 1 capsule by mouth in the morning and 1 capsule before bedtime. Do all  this for 14 days., Disp: 28 capsule, Rfl: 0  acetaminophen (TYLENOL) 500 MG tablet, Take 2 tablets by mouth every 8 (eight) hours as needed for Pain, Disp: 60 tablet, Rfl: 0  oxyCODONE (ROXICODONE) 5 MG immediate release tablet, Take 1 tablet by mouth every 8 (eight) hours as needed for Pain Max Daily Amount: 15 mg  for up to 7 days, Disp: 21 tablet, Rfl: 0  solifenacin (VESICARE) 10 MG tablet, TAKE 1 TABLET BY MOUTH EVERY DAY IN THE MORNING, Disp: 90 tablet, Rfl: 0  hydrocortisone 2.5 % cream, Apply topically 2 (two) times daily To affected areas of scalp and ears, Disp: 56 g, Rfl: 1  estradiol (ESTRACE) 0.1 MG/GM vaginal cream, PLACE 5 G VAGINALLY 3 TIMES A WEEK MYLAN BRAND ONLY., Disp: 42.5 g, Rfl: 0  famotidine (PEPCID) 20 MG tablet, TAKE 1 TABLET BY MOUTH IN THE MORNING AND BEFORE BEDTIME, Disp: 180 tablet, Rfl: 0  apixaban (ELIQUIS) 2.5 MG po tablet, Take 1 tablet by mouth in the morning and 1 tablet before bedtime., Disp: 60 tablet, Rfl: 1  pantoprazole (PROTONIX) 40 MG tablet, Take 1 tablet by  mouth daily, Disp: 30 tablet, Rfl: 0  buprenorphine-naloxone (SUBOXONE) 8-2 MG sublingual film, Take 2.5 films under the tongue daily., Disp: 70 Film, Rfl: 0  tiotropium-olodaterol (STIOLTO RESPIMAT) 2.5-2.5 MCG/ACT inhal, Inhale 2 puffs into the lungs daily, Disp: , Rfl:   buPROPion (WELLBUTRIN SR) 200 MG 12 hr tablet, TAKE 1 TABLET BY MOUTH IN THE MORNING AND BEFORE BEDTIME, Disp: 180 tablet, Rfl: 3  cetirizine (ZYRTEC) 10 MG tablet, TAKE 1 TABLET BY MOUTH EVERY DAY IN THE MORNING, Disp: 90 tablet, Rfl: 3  VITAMIN D3 50 MCG (2000 UT) TABS tablet, TAKE 1 TABLET BY MOUTH EVERY DAY IN THE MORNING, Disp: 90 tablet, Rfl: 3  risedronate (ACTONEL) 150 MG tablet, Take 1 tablet by mouth every 30 (thirty) days with full glass of water on empty stomach-nothing else by mouth and do not lie down for 1/2 hour (Patient not taking: Reported on 03/27/2023), Disp: 3 tablet, Rfl: 3  atorvastatin (LIPITOR) 80 MG tablet, Take 1 tablet by  mouth Daily after dinner (Patient not taking: Reported on 03/27/2023), Disp: 90 tablet, Rfl: 3  polyethylene glycol (GLYCOLAX/MIRALAX) 17 g packet, Take 1 packet by mouth in the morning. (Patient not taking: Reported on 03/27/2023), Disp: 90 packet, Rfl: 3  albuterol HFA 108 (90 Base) MCG/ACT inhaler, INHALE 2 PUFFS INTO THE LUNGS EVERY 6 HOURS AS NEEDED FOR WHEEZING OR SHORTNESS OF BREATH, Disp: 8.5 g, Rfl: 11  ipratropium-albuterol (DUO-NEB) 0.5-2.5 (3) MG/3ML SOLN Inhalation Solution, Take 3 mLs by nebulization 4 (four) times daily., Disp: 180 mL, Rfl: 0    No current facility-administered medications for this visit.      Tobacco Use:   Social History    Tobacco Use      Smoking status: Former        Packs/day: 0.00        Years: 1 pack/day for 32.0 years (32.0 ttl pk-yrs)        Types: Cigarettes        Start date: 09/26/1989        Quit date: 09/26/2021        Years since quitting: 1.4      Smokeless tobacco: Never      Tobacco comments: quit 1980-1996, then light until 2003, then 1 ppd since      Alcohol:     Alcohol use   No           Review of Systems     Patient reports she is in her usual state of health denies fever or chills.  Was started on an oral antibiotic after being seen in the emergency department yesterday, 02/23/2023.        Physical Exam  Vitals reviewed.   Constitutional:       Appearance: She is obese.      Comments: Poor conditioning   HENT:      Head: Normocephalic.      Nose: Nose normal.   Eyes:      Extraocular Movements: Extraocular movements intact.   Cardiovascular:      Rate and Rhythm: Normal rate and regular rhythm.   Pulmonary:      Effort: Pulmonary effort is normal.   Abdominal:      General: Bowel sounds are normal.      Comments: obese   Musculoskeletal:      Comments: RLE: Incision dry, evidence of earlier bleeding, TTP globally, bruising medially, patient unable to lift or maintain leg elevated, pitting edema, calf NT, NVI   Neurological:  Mental Status: She is alert.            DATA LAST 24 HOURS:     Recent labs:    Lab Results   Component Value Date    UACOL YELLOW 12/08/2022    UACLA SL CLOUDY (A) 12/08/2022    OXYUPH 5.5 06/30/2019    OXYUSPGR 1.028 06/30/2019    UAPRO NEGATIVE 12/08/2022    UAGLU NEGATIVE 12/08/2022    UAKET NEGATIVE 12/08/2022    UAOCC TRACE (A) 12/08/2022    UANIT NEGATIVE 12/08/2022    UABIL NEGATIVE 12/08/2022    LEUKOCYTES NEGATIVE 12/08/2022    UAWBC MODERATE 5-10 (A) 12/08/2022    UARBC FEW 3-4 (A) 12/08/2022    UASQE 5-10 (A) 12/08/2022    UACRY NONE SEEN 12/08/2022    UABAC 10-50 (A) 12/08/2022    UACAS NONE SEEN 12/08/2022       Imaging: X-ray 03/26/2023 images and report reviewed.  Right knee replacement without acute complication.  However there is questionable patella alta.    Assessment/Plan: Patient is a 67 year old who is 16 days status post right total knee arthroplasty.  Unfortunately her clinical exam and radiograph are consistent with a patella tendon rupture.  Patient will be scheduled for surgery on Monday 03/30/2023.  She was given CHG wipes and instructions for presurgical preparation. She will hold Eliquis for 48 hours prior to surgery. Surgery will be [facility] at [facility] with Dr Lennie Muckle.    Salvadore Dom, PA-C, 03/27/2023       Pager [phone]

## 2023-03-27 NOTE — Telephone Encounter (Signed)
Appointment offered with Dr. Sallee Lange f or today at 10:30 am.Patient agreed.

## 2023-03-27 NOTE — Telephone Encounter (Signed)
-----   Message from Gabriel Rainwater sent at 03/27/2023  8:59 AM EST -----  Regarding: Appointment today  Cheryl Dixon    Can you call this patient to see if she can come into clinic here in [place] today for an ED follow-up visit?  Thanks.  Annette Stable

## 2023-03-27 NOTE — Narrator Note (Signed)
[facility]S booked for transport home.

## 2023-03-27 NOTE — Narrator Note (Signed)
Patient Disposition  Patient education for diagnosis, medications, activity, diet and follow-up.  Patient left ED 1:31 AM.  Patient rep received written instructions.    Interpreter to provide instructions: No    Patient belongings with patient: YES    Have all existing LDAs been addressed? Yes    Have all IV infusions been stopped? N/A    Destination: Discharged to home via [facility]

## 2023-03-27 NOTE — Progress Notes (Signed)
Clinic visit.  See today's H&P for detail.  Surgery to be scheduled for Monday,03/30/2023.    Salvadore Dom, PA-C  03/27/23

## 2023-03-27 NOTE — ED Notes (Signed)
ED Attending Sign-out Note  Pt received in sign-out at change of shift.  ED Course as of 03/27/23 0015   Thu Mar 26, 2023   2301 Signout: s/p TNR few weeks ago, felt crack and had severe R leg pain tonight. Ortho reviewed XR and await CT. Plan DC on keflex with knee immobilizer, to f/u with ortho in clinic tomorrow.    Fri Mar 27, 2023   0014 CT Lower Extremity Right WO Contrast  VRC read - diffuse soft tissue swelling, streak artifact obscures patellar tendon         Impression:  Acute pain of right knee    Disposition:  Discharge      Signed by Velva Harman, MD  Emergency Medicine Attending  [facility]

## 2023-03-27 NOTE — Narrator Note (Signed)
Pt calling family for ride home; informed her that I spoke to her daughter approx 5 min prior to entering her room for DC and did inform her that her mother was ready to be discharged

## 2023-03-27 NOTE — Patient Instructions (Signed)
Keep the wound dressing and knee immobilizer on at all times.  Please limit your activity.  You should keep your leg elevated whenever possible.  When up and around it is okay to put weight on the leg.    You should stop your Eliquis after the last dose tonight.  No Eliquis tomorrow or Sunday.    You can continue your other medications as usual.    Surgery is tentatively scheduled for Monday, March 30, 2023.  Please arrive at the [facility] around 9:30 AM.  Other than the drink provided and your usual medication, you should have nothing to eat or drink after midnight.    Contact the provider on-call return to the emergency room for any marked bleeding on the dressing, increased pain

## 2023-03-27 NOTE — Progress Notes (Signed)
This patient is here for evaluation of an acute injury to the right knee.    This patient is now just over 2 weeks status post cemented total knee arthroplasty, right.  She had been progressing on target after that surgery.  However, on March 26, 2023 the patient was sitting into a low chair.  The chair was lower than she thought as she sat down.  She felt a slight popping sensation and immediate pain over the anterior aspect of the right knee.  After that she was unable to extend her knee.  She was evaluated and [facility] emergency room.  I have reviewed the emergency room record.  X-rays were done in conjunction with that visit and will be reviewed below.    Concern was raised that the patient had a patella tendon disruption.  She was placed into a knee immobilizer and advised to be evaluated today.    She has had no change in health since last visit.    On exam there is a superficial dehiscence of the most inferior aspect of the right knee wound about 2 and half centimeters in total distance.  Deeper structures intact.  No hemarthrosis.  Ecchymosis present anterior medially at the knee.  This was not present when I saw the patient on November 27.  She is unable to maintain the knee in extension.  Calf is soft and nontender.  Neurovascular exam distally normal.    X-rays of the right knee reveal a cemented total knee arthroplasty in place with components in good position except for patella alta.  CT scan was done and reviewed, as well.  There is increased soft tissue density anteriorly in the infrapatellar zone consistent with potential patellar tendon disruption or local hematoma.  No acute fracture seen.    The patient has a patella tendon disruption after knee arthroplasty.  Findings reviewed with the patient in detail.  I suggested that she consider patella tendon repair.  I discussed with the patient moving forward with the surgery being performed by Dr. Penny Pia.  The patient would like  to do so.  Surgery will be arranged to be done in the not-too-distant future.  In the meantime the patient will continue with a knee immobilizer full-time.  A new Optifoam dressing applied.  AVS completed.    The patient will hold her Eliquis as of tomorrow morning's dose.    History and physical done in conjunction with the clinic visit.  Please see the H&P of Esperanza Sheets, PA-C.

## 2023-03-28 ENCOUNTER — Inpatient Hospital Stay
Admission: AD | Admit: 2023-03-28 | Discharge: 2023-04-01 | DRG: 908 | Disposition: A | Discharge status: Skilled Nursing Facility | Payer: No Typology Code available for payment source | Attending: Hospitalist | Admitting: Hospitalist

## 2023-03-28 DIAGNOSIS — F419 Anxiety disorder, unspecified: Secondary | ICD-10-CM | POA: Diagnosis present

## 2023-03-28 DIAGNOSIS — Y92002 Bathroom of unspecified non-institutional (private) residence single-family (private) house as the place of occurrence of the external cause: Secondary | ICD-10-CM

## 2023-03-28 DIAGNOSIS — T8131XA Disruption of external operation (surgical) wound, not elsewhere classified, initial encounter: Secondary | ICD-10-CM

## 2023-03-28 DIAGNOSIS — Z8673 Personal history of transient ischemic attack (TIA), and cerebral infarction without residual deficits: Secondary | ICD-10-CM

## 2023-03-28 DIAGNOSIS — G8911 Acute pain due to trauma: Secondary | ICD-10-CM

## 2023-03-28 DIAGNOSIS — Y92008 Other place in unspecified non-institutional (private) residence as the place of occurrence of the external cause: Secondary | ICD-10-CM

## 2023-03-28 DIAGNOSIS — S76111A Strain of right quadriceps muscle, fascia and tendon, initial encounter: Secondary | ICD-10-CM | POA: Diagnosis present

## 2023-03-28 DIAGNOSIS — M25561 Pain in right knee: Secondary | ICD-10-CM | POA: Diagnosis not present

## 2023-03-28 DIAGNOSIS — D649 Anemia, unspecified: Secondary | ICD-10-CM | POA: Diagnosis present

## 2023-03-28 DIAGNOSIS — S86811A Strain of other muscle(s) and tendon(s) at lower leg level, right leg, initial encounter: Secondary | ICD-10-CM | POA: Diagnosis present

## 2023-03-28 DIAGNOSIS — R7303 Prediabetes: Secondary | ICD-10-CM | POA: Diagnosis present

## 2023-03-28 DIAGNOSIS — I639 Cerebral infarction, unspecified: Secondary | ICD-10-CM | POA: Diagnosis present

## 2023-03-28 DIAGNOSIS — J449 Chronic obstructive pulmonary disease, unspecified: Secondary | ICD-10-CM | POA: Diagnosis present

## 2023-03-28 DIAGNOSIS — M810 Age-related osteoporosis without current pathological fracture: Secondary | ICD-10-CM | POA: Diagnosis present

## 2023-03-28 DIAGNOSIS — T81328A Disruption or dehiscence of closure of other specified internal operation (surgical) wound, initial encounter: Principal | ICD-10-CM | POA: Diagnosis present

## 2023-03-28 DIAGNOSIS — I714 Abdominal aortic aneurysm, without rupture, unspecified: Secondary | ICD-10-CM | POA: Diagnosis present

## 2023-03-28 DIAGNOSIS — M25061 Hemarthrosis, right knee: Secondary | ICD-10-CM | POA: Diagnosis present

## 2023-03-28 DIAGNOSIS — W19XXXA Unspecified fall, initial encounter: Secondary | ICD-10-CM | POA: Diagnosis not present

## 2023-03-28 DIAGNOSIS — I10 Essential (primary) hypertension: Secondary | ICD-10-CM | POA: Diagnosis present

## 2023-03-28 DIAGNOSIS — Y793 Surgical instruments, materials and orthopedic devices (including sutures) associated with adverse incidents: Secondary | ICD-10-CM | POA: Diagnosis present

## 2023-03-28 DIAGNOSIS — M170 Bilateral primary osteoarthritis of knee: Secondary | ICD-10-CM | POA: Diagnosis present

## 2023-03-28 DIAGNOSIS — Z96651 Presence of right artificial knee joint: Secondary | ICD-10-CM | POA: Diagnosis not present

## 2023-03-28 DIAGNOSIS — W010XXA Fall on same level from slipping, tripping and stumbling without subsequent striking against object, initial encounter: Secondary | ICD-10-CM | POA: Diagnosis present

## 2023-03-28 DIAGNOSIS — F1120 Opioid dependence, uncomplicated: Secondary | ICD-10-CM | POA: Diagnosis present

## 2023-03-28 DIAGNOSIS — I6330 Cerebral infarction due to thrombosis of unspecified cerebral artery: Secondary | ICD-10-CM

## 2023-03-28 DIAGNOSIS — Y929 Unspecified place or not applicable: Secondary | ICD-10-CM

## 2023-03-28 DIAGNOSIS — S86811D Strain of other muscle(s) and tendon(s) at lower leg level, right leg, subsequent encounter: Principal | ICD-10-CM | POA: Diagnosis present

## 2023-03-28 MED ORDER — OXYCODONE HCL 10 MG PO TABS
10.0000 mg | ORAL_TABLET | Freq: Once | ORAL | Status: AC
Start: 2023-03-29 — End: 2023-03-28
  Administered 2023-03-28: 10 mg via ORAL
  Filled 2023-03-28: qty 1

## 2023-03-28 MED ORDER — SODIUM CHLORIDE 0.9 % MINI-BAG PLUS
2.0000 g | Freq: Once | INTRAVENOUS | Status: AC
Start: 2023-03-29 — End: 2023-03-29
  Administered 2023-03-29: 2 g via INTRAVENOUS
  Filled 2023-03-28: qty 20

## 2023-03-28 MED ORDER — MORPHINE SULFATE 4 MG/ML IV SOLN (SUPER ERX)
4.0000 mg | Freq: Once | Status: DC
Start: 2023-03-28 — End: 2023-03-28
  Filled 2023-03-28: qty 1

## 2023-03-28 MED ORDER — DIAZEPAM 5 MG PO TABS
5.0000 mg | ORAL_TABLET | Freq: Once | ORAL | Status: AC
Start: 2023-03-28 — End: 2023-03-28
  Administered 2023-03-28: 5 mg via ORAL
  Filled 2023-03-28: qty 1

## 2023-03-28 NOTE — ED Triage Note (Addendum)
Pt present from home via [facility] with reports that she feels she tore her sutures from her Surgical site. Reports using a walker but not bearing all of her wt on leg. Bleeding from the surgical site that was rewrapped by EMS. Last pain medication oxycodone was at 2 pm. Reports having a procedure scheduled from Monday Dec 2,2024

## 2023-03-28 NOTE — ED Provider Notes (Signed)
Patient was seen with the physician assistant please locate his note and my attestation

## 2023-03-29 ENCOUNTER — Encounter (HOSPITAL_BASED_OUTPATIENT_CLINIC_OR_DEPARTMENT_OTHER): Payer: Self-pay

## 2023-03-29 ENCOUNTER — Inpatient Hospital Stay (HOSPITAL_BASED_OUTPATIENT_CLINIC_OR_DEPARTMENT_OTHER): Payer: No Typology Code available for payment source | Admitting: Anesthesiology

## 2023-03-29 ENCOUNTER — Encounter (HOSPITAL_BASED_OUTPATIENT_CLINIC_OR_DEPARTMENT_OTHER): Payer: Self-pay | Admitting: Internal Medicine

## 2023-03-29 ENCOUNTER — Inpatient Hospital Stay (HOSPITAL_BASED_OUTPATIENT_CLINIC_OR_DEPARTMENT_OTHER)
Admission: AD | Disposition: A | Discharge status: Skilled Nursing Facility | Payer: Self-pay | Source: Emergency Department | Attending: Hospitalist

## 2023-03-29 DIAGNOSIS — M25561 Pain in right knee: Secondary | ICD-10-CM

## 2023-03-29 DIAGNOSIS — Y929 Unspecified place or not applicable: Secondary | ICD-10-CM | POA: Diagnosis not present

## 2023-03-29 DIAGNOSIS — Z96651 Presence of right artificial knee joint: Secondary | ICD-10-CM | POA: Insufficient documentation

## 2023-03-29 DIAGNOSIS — T8131XA Disruption of external operation (surgical) wound, not elsewhere classified, initial encounter: Secondary | ICD-10-CM | POA: Diagnosis not present

## 2023-03-29 DIAGNOSIS — M25061 Hemarthrosis, right knee: Secondary | ICD-10-CM | POA: Diagnosis present

## 2023-03-29 DIAGNOSIS — I10 Essential (primary) hypertension: Secondary | ICD-10-CM | POA: Diagnosis present

## 2023-03-29 DIAGNOSIS — I7121 Aneurysm of the ascending aorta, without rupture: Secondary | ICD-10-CM | POA: Diagnosis not present

## 2023-03-29 DIAGNOSIS — S76111A Strain of right quadriceps muscle, fascia and tendon, initial encounter: Secondary | ICD-10-CM | POA: Diagnosis present

## 2023-03-29 DIAGNOSIS — T84092A Other mechanical complication of internal right knee prosthesis, initial encounter: Secondary | ICD-10-CM | POA: Diagnosis not present

## 2023-03-29 DIAGNOSIS — S86811A Strain of other muscle(s) and tendon(s) at lower leg level, right leg, initial encounter: Secondary | ICD-10-CM

## 2023-03-29 DIAGNOSIS — T81328A Disruption or dehiscence of closure of other specified internal operation (surgical) wound, initial encounter: Secondary | ICD-10-CM | POA: Diagnosis present

## 2023-03-29 DIAGNOSIS — M170 Bilateral primary osteoarthritis of knee: Secondary | ICD-10-CM | POA: Diagnosis present

## 2023-03-29 DIAGNOSIS — W010XXA Fall on same level from slipping, tripping and stumbling without subsequent striking against object, initial encounter: Secondary | ICD-10-CM | POA: Diagnosis present

## 2023-03-29 DIAGNOSIS — R7303 Prediabetes: Secondary | ICD-10-CM | POA: Diagnosis present

## 2023-03-29 DIAGNOSIS — J449 Chronic obstructive pulmonary disease, unspecified: Secondary | ICD-10-CM

## 2023-03-29 DIAGNOSIS — Z471 Aftercare following joint replacement surgery: Secondary | ICD-10-CM | POA: Diagnosis not present

## 2023-03-29 DIAGNOSIS — S86811D Strain of other muscle(s) and tendon(s) at lower leg level, right leg, subsequent encounter: Secondary | ICD-10-CM | POA: Diagnosis not present

## 2023-03-29 DIAGNOSIS — D649 Anemia, unspecified: Secondary | ICD-10-CM | POA: Diagnosis present

## 2023-03-29 DIAGNOSIS — M810 Age-related osteoporosis without current pathological fracture: Secondary | ICD-10-CM | POA: Diagnosis present

## 2023-03-29 DIAGNOSIS — S86891A Other injury of other muscle(s) and tendon(s) at lower leg level, right leg, initial encounter: Secondary | ICD-10-CM | POA: Diagnosis not present

## 2023-03-29 DIAGNOSIS — Y793 Surgical instruments, materials and orthopedic devices (including sutures) associated with adverse incidents: Secondary | ICD-10-CM | POA: Diagnosis present

## 2023-03-29 DIAGNOSIS — I6330 Cerebral infarction due to thrombosis of unspecified cerebral artery: Secondary | ICD-10-CM | POA: Diagnosis not present

## 2023-03-29 DIAGNOSIS — F419 Anxiety disorder, unspecified: Secondary | ICD-10-CM | POA: Diagnosis present

## 2023-03-29 DIAGNOSIS — W19XXXA Unspecified fall, initial encounter: Secondary | ICD-10-CM | POA: Diagnosis not present

## 2023-03-29 DIAGNOSIS — N3281 Overactive bladder: Secondary | ICD-10-CM | POA: Diagnosis not present

## 2023-03-29 DIAGNOSIS — I714 Abdominal aortic aneurysm, without rupture, unspecified: Secondary | ICD-10-CM | POA: Diagnosis present

## 2023-03-29 DIAGNOSIS — Y92008 Other place in unspecified non-institutional (private) residence as the place of occurrence of the external cause: Secondary | ICD-10-CM | POA: Diagnosis not present

## 2023-03-29 DIAGNOSIS — F332 Major depressive disorder, recurrent severe without psychotic features: Secondary | ICD-10-CM | POA: Diagnosis not present

## 2023-03-29 DIAGNOSIS — F1120 Opioid dependence, uncomplicated: Secondary | ICD-10-CM | POA: Diagnosis present

## 2023-03-29 DIAGNOSIS — Z8673 Personal history of transient ischemic attack (TIA), and cerebral infarction without residual deficits: Secondary | ICD-10-CM | POA: Diagnosis not present

## 2023-03-29 LAB — CBC, PLATELET & DIFFERENTIAL
ABSOLUTE BASO COUNT: 0.1 10*3/uL (ref 0.0–0.1)
ABSOLUTE EOSINOPHIL COUNT: 0.2 10*3/uL (ref 0.0–0.8)
ABSOLUTE IMM GRAN COUNT: 0.15 10*3/uL — ABNORMAL HIGH (ref 0.00–0.10)
ABSOLUTE LYMPH COUNT: 1 10*3/uL (ref 0.6–5.9)
ABSOLUTE MONO COUNT: 0.7 10*3/uL (ref 0.2–1.4)
ABSOLUTE NEUTROPHIL COUNT: 5.9 10*3/uL (ref 1.6–8.3)
ABSOLUTE NRBC COUNT: 0 10*3/uL (ref 0.0–0.0)
BASOPHIL %: 0.8 % (ref 0.0–1.2)
EOSINOPHIL %: 2.5 % (ref 0.0–7.0)
HEMATOCRIT: 32 % — ABNORMAL LOW (ref 34.1–44.9)
HEMOGLOBIN: 9.8 g/dL — ABNORMAL LOW (ref 11.2–15.7)
IMMATURE GRANULOCYTE %: 1.9 % — ABNORMAL HIGH (ref 0.0–1.0)
LYMPHOCYTE %: 12.1 % — ABNORMAL LOW (ref 15.0–54.0)
MEAN CORP HGB CONC: 30.6 g/dL — ABNORMAL LOW (ref 31.0–37.0)
MEAN CORPUSCULAR HGB: 27.8 pg (ref 26.0–34.0)
MEAN CORPUSCULAR VOL: 90.9 fL (ref 80.0–100.0)
MEAN PLATELET VOLUME: 8.6 fL — ABNORMAL LOW (ref 8.7–12.5)
MONOCYTE %: 8.7 % (ref 4.0–13.0)
NEUTROPHIL %: 74 % (ref 40.0–75.0)
NRBC %: 0 % (ref 0.0–0.0)
PLATELET COUNT: 451 10*3/uL — ABNORMAL HIGH (ref 150–400)
RBC DISTRIBUTION WIDTH STD DEV: 47.5 fL — ABNORMAL HIGH (ref 35.1–46.3)
RED BLOOD CELL COUNT: 3.52 M/uL — ABNORMAL LOW (ref 3.90–5.20)
WHITE BLOOD CELL COUNT: 7.9 10*3/uL (ref 4.0–11.0)

## 2023-03-29 LAB — CBC WITH PLATELET
ABSOLUTE NRBC COUNT: 0 10*3/uL (ref 0.0–0.0)
HEMATOCRIT: 27.1 % — ABNORMAL LOW (ref 34.1–44.9)
HEMOGLOBIN: 8.3 g/dL — ABNORMAL LOW (ref 11.2–15.7)
MEAN CORP HGB CONC: 30.6 g/dL — ABNORMAL LOW (ref 31.0–37.0)
MEAN CORPUSCULAR HGB: 27.6 pg (ref 26.0–34.0)
MEAN CORPUSCULAR VOL: 90 fL (ref 80.0–100.0)
MEAN PLATELET VOLUME: 8.5 fL — ABNORMAL LOW (ref 8.7–12.5)
NRBC %: 0 % (ref 0.0–0.0)
PLATELET COUNT: 417 10*3/uL — ABNORMAL HIGH (ref 150–400)
RBC DISTRIBUTION WIDTH STD DEV: 46.3 fL (ref 35.1–46.3)
RED BLOOD CELL COUNT: 3.01 M/uL — ABNORMAL LOW (ref 3.90–5.20)
WHITE BLOOD CELL COUNT: 8.9 10*3/uL (ref 4.0–11.0)

## 2023-03-29 LAB — BASIC METABOLIC PANEL
ANION GAP: 10 mmol/L (ref 10–22)
BUN (UREA NITROGEN): 19 mg/dL — ABNORMAL HIGH (ref 7–18)
CALCIUM: 8.8 mg/dL (ref 8.5–10.5)
CARBON DIOXIDE: 26 mmol/L (ref 21–32)
CHLORIDE: 101 mmol/L (ref 98–107)
CREATININE: 0.9 mg/dL (ref 0.4–1.2)
ESTIMATED GLOMERULAR FILT RATE: >60 mL/min (ref >60)
Glucose Random: 124 mg/dL (ref 74–160)
POTASSIUM: 4.2 mmol/L (ref 3.5–5.1)
SODIUM: 136 mmol/L (ref 136–145)

## 2023-03-29 LAB — TYPE AND SCREEN
ABO/RH INTERPRETATION: A POS
ANTIBODY SCREEN SOLID PHASE: NEGATIVE

## 2023-03-29 SURGERY — REPAIR, TENDON, PATELLAR
Anesthesia: General | Laterality: Right | Wound class: Class IV/ Dirty or Infected

## 2023-03-29 MED ORDER — KETOROLAC TROMETHAMINE 15 MG/ML IJ SOLN
15.0000 mg | Freq: Once | INTRAMUSCULAR | Status: AC
Start: 2023-03-29 — End: 2023-03-29
  Administered 2023-03-29: 15 mg via INTRAVENOUS
  Filled 2023-03-29: qty 1

## 2023-03-29 MED ORDER — PROPOFOL INFUSION
INTRAVENOUS | Status: AC
Start: 2023-03-29 — End: 2023-03-29
  Filled 2023-03-29: qty 50

## 2023-03-29 MED ORDER — MORPHINE SULFATE 2 MG/ML IV SOLN (SUPER ERX)
2.0000 mg | Status: DC | PRN
Start: 2023-03-29 — End: 2023-03-29

## 2023-03-29 MED ORDER — ONDANSETRON HCL 4 MG/2ML IJ SOLN
INTRAMUSCULAR | Status: AC
Start: 2023-03-29 — End: 2023-03-29
  Filled 2023-03-29: qty 2

## 2023-03-29 MED ORDER — DIPHENHYDRAMINE HCL 50 MG/ML IJ SOLN
25.0000 mg | INTRAMUSCULAR | Status: DC | PRN
Start: 2023-03-29 — End: 2023-03-29

## 2023-03-29 MED ORDER — VANCOMYCIN HCL 1 G IV SOLR
15.0000 mg/kg | Freq: Once | INTRAVENOUS | Status: AC
Start: 2023-03-29 — End: 2023-03-29
  Administered 2023-03-29: 1250 mg via INTRAVENOUS
  Filled 2023-03-29: qty 1.25

## 2023-03-29 MED ORDER — LIDOCAINE HCL (PF) 2 % IJ SOLN
INTRAMUSCULAR | Status: AC
Start: 2023-03-29 — End: 2023-03-29
  Filled 2023-03-29: qty 5

## 2023-03-29 MED ORDER — PROPOFOL 200 MG/20ML IV EMUL
INTRAVENOUS | Status: AC
Start: 2023-03-29 — End: 2023-03-29
  Filled 2023-03-29: qty 40

## 2023-03-29 MED ORDER — POLYETHYLENE GLYCOL 3350 17 G PO PACK
17.0000 g | PACK | Freq: Every day | ORAL | Status: DC
Start: 2023-03-29 — End: 2023-04-01
  Administered 2023-03-29 – 2023-03-31 (×3): 17 g via ORAL
  Filled 2023-03-29 (×4): qty 1

## 2023-03-29 MED ORDER — PROPOFOL 200 MG/20ML IV EMUL
Freq: Once | INTRAVENOUS | Status: DC | PRN
Start: 2023-03-29 — End: 2023-03-29
  Administered 2023-03-29: 150 mg via INTRAVENOUS
  Administered 2023-03-29: 50 mg via INTRAVENOUS

## 2023-03-29 MED ORDER — CEPHALEXIN 250 MG PO CAPS
250.0000 mg | ORAL_CAPSULE | Freq: Four times a day (QID) | ORAL | Status: DC
Start: 2023-03-29 — End: 2023-03-30
  Administered 2023-03-29 – 2023-03-30 (×4): 250 mg via ORAL
  Filled 2023-03-29 (×4): qty 1

## 2023-03-29 MED ORDER — KETAMINE HCL 10 MG/ML IJ SOLN
Freq: Once | INTRAMUSCULAR | Status: DC | PRN
Start: 2023-03-29 — End: 2023-03-29
  Administered 2023-03-29: 20 mg via INTRAVENOUS

## 2023-03-29 MED ORDER — KETOROLAC TROMETHAMINE 30 MG/ML INJ
Freq: Once | Status: DC | PRN
Start: 2023-03-29 — End: 2023-03-29
  Administered 2023-03-29: 15 mg via INTRAVENOUS

## 2023-03-29 MED ORDER — HYDROMORPHONE HCL 2 MG/ML IJ SOLN (SUPER ERX)
Freq: Once | Status: DC | PRN
Start: 2023-03-29 — End: 2023-03-29
  Administered 2023-03-29 (×3): .4 mg via INTRAVENOUS
  Administered 2023-03-29: 1 mg via INTRAVENOUS
  Administered 2023-03-29: .4 mg via INTRAVENOUS
  Administered 2023-03-29: 1 mg via INTRAVENOUS
  Administered 2023-03-29: .4 mg via INTRAVENOUS

## 2023-03-29 MED ORDER — HYDROMORPHONE HCL 1 MG/ML IJ SOLN (SUPER ERX)
0.4000 mg | Status: DC | PRN
Start: 2023-03-29 — End: 2023-03-31
  Administered 2023-03-29 – 2023-03-30 (×4): 0.4 mg via INTRAVENOUS
  Filled 2023-03-29 (×4): qty 0.5

## 2023-03-29 MED ORDER — HYDROMORPHONE HCL 1 MG/ML IJ SOLN (SUPER ERX)
Status: AC
Start: 2023-03-29 — End: 2023-03-30
  Filled 2023-03-29: qty 1

## 2023-03-29 MED ORDER — VITAMIN D 25 MCG (1000 UT) PO TABS
2000.0000 [IU] | ORAL_TABLET | Freq: Every day | ORAL | Status: DC
Start: 2023-03-29 — End: 2023-04-01
  Administered 2023-03-29 – 2023-04-01 (×4): 2000 [IU] via ORAL
  Filled 2023-03-29 (×4): qty 2

## 2023-03-29 MED ORDER — MIDAZOLAM HCL 2 MG/2 ML IJ SOLN
0.5000 mg | INTRAMUSCULAR | Status: AC | PRN
Start: 2023-03-29 — End: 2023-03-29
  Administered 2023-03-29 (×4): 0.5 mg via INTRAVENOUS
  Filled 2023-03-29: qty 2

## 2023-03-29 MED ORDER — ONDANSETRON HCL 4 MG/2ML IJ SOLN
4.0000 mg | Freq: Once | INTRAMUSCULAR | Status: DC | PRN
Start: 2023-03-29 — End: 2023-03-29

## 2023-03-29 MED ORDER — MIDAZOLAM HCL 2 MG/2 ML IJ SOLN
Freq: Once | INTRAMUSCULAR | Status: DC | PRN
Start: 2023-03-29 — End: 2023-03-29
  Administered 2023-03-29: 2 mg via INTRAVENOUS

## 2023-03-29 MED ORDER — APIXABAN 5 MG PO TABS
5.0000 mg | ORAL_TABLET | Freq: Two times a day (BID) | ORAL | Status: DC
Start: 2023-03-30 — End: 2023-03-29

## 2023-03-29 MED ORDER — SODIUM CHLORIDE 0.9 % MINI-BAG PLUS
2.0000 g | Freq: Once | INTRAVENOUS | Status: DC
Start: 2023-03-29 — End: 2023-03-29

## 2023-03-29 MED ORDER — ACETAMINOPHEN 500 MG PO TABS
1000.0000 mg | ORAL_TABLET | Freq: Three times a day (TID) | ORAL | Status: DC
Start: 2023-03-29 — End: 2023-04-01
  Administered 2023-03-29 – 2023-04-01 (×10): 1000 mg via ORAL
  Filled 2023-03-29 (×10): qty 2

## 2023-03-29 MED ORDER — LIDOCAINE HCL (PF) 2 % IJ SOLN
Freq: Once | INTRAMUSCULAR | Status: DC | PRN
Start: 2023-03-29 — End: 2023-03-29
  Administered 2023-03-29: 100 mg via INTRAVENOUS

## 2023-03-29 MED ORDER — HYDROMORPHONE HCL 1 MG/ML IJ SOLN (SUPER ERX)
0.2500 mg | Status: AC | PRN
Start: 2023-03-29 — End: 2023-03-29
  Administered 2023-03-29 (×4): 0.25 mg via INTRAVENOUS
  Filled 2023-03-29 (×2): qty 0.5

## 2023-03-29 MED ORDER — BUPRENORPHINE HCL-NALOXONE HCL 8-2 MG SL SUBL
20.0000 mg | SUBLINGUAL_TABLET | Freq: Every day | SUBLINGUAL | Status: DC
Start: 2023-03-29 — End: 2023-03-30
  Administered 2023-03-29 – 2023-03-30 (×2): 2.5 via SUBLINGUAL
  Filled 2023-03-29 (×2): qty 3

## 2023-03-29 MED ORDER — PROPOFOL 500 MG/50 ML IV
INTRAVENOUS | Status: DC | PRN
Start: 2023-03-29 — End: 2023-03-29
  Administered 2023-03-29: 100 ug/kg/min via INTRAVENOUS
  Administered 2023-03-29: 50 ug/kg/min via INTRAVENOUS
  Administered 2023-03-29: 150 ug/kg/min via INTRAVENOUS

## 2023-03-29 MED ORDER — HYDROMORPHONE HCL 1 MG/ML IJ SOLN (SUPER ERX)
0.6000 mg | Status: DC | PRN
Start: 2023-03-29 — End: 2023-03-31
  Administered 2023-03-30 – 2023-03-31 (×4): 0.6 mg via INTRAVENOUS
  Filled 2023-03-29 (×4): qty 1

## 2023-03-29 MED ORDER — DEXAMETHASONE SODIUM PHOSPHATE 4 MG/ML IJ SOLN
Freq: Once | INTRAMUSCULAR | Status: DC | PRN
Start: 2023-03-29 — End: 2023-03-29
  Administered 2023-03-29: 4 mg via INTRAVENOUS

## 2023-03-29 MED ORDER — FENTANYL CITRATE 0.05 MG/ML IJ SOLN
25.0000 ug | INTRAMUSCULAR | Status: AC | PRN
Start: 2023-03-29 — End: 2023-03-29
  Administered 2023-03-29 (×4): 25 ug via INTRAVENOUS
  Filled 2023-03-29: qty 2

## 2023-03-29 MED ORDER — OLODATEROL HCL 2.5 MCG/ACT IN AERS
2.0000 | INHALATION_SPRAY | Freq: Every day | RESPIRATORY_TRACT | Status: DC
Start: 2023-03-29 — End: 2023-04-01
  Administered 2023-03-29 – 2023-04-01 (×4): 2 via RESPIRATORY_TRACT
  Filled 2023-03-29: qty 4

## 2023-03-29 MED ORDER — MORPHINE SULFATE 2 MG/ML IV SOLN (SUPER ERX)
2.0000 mg | Status: DC | PRN
Start: 2023-03-29 — End: 2023-03-29
  Administered 2023-03-29 (×2): 2 mg via INTRAVENOUS
  Filled 2023-03-29 (×2): qty 1

## 2023-03-29 MED ORDER — TIOTROPIUM BROMIDE MONOHYDRATE 2.5 MCG/ACT IN AERS
2.0000 | INHALATION_SPRAY | Freq: Every day | RESPIRATORY_TRACT | Status: DC
Start: 2023-03-29 — End: 2023-04-01
  Administered 2023-03-29 – 2023-04-01 (×4): 2 via RESPIRATORY_TRACT
  Filled 2023-03-29: qty 4

## 2023-03-29 MED ORDER — ONDANSETRON HCL 4 MG/2ML IJ SOLN
Freq: Once | INTRAMUSCULAR | Status: DC | PRN
Start: 2023-03-29 — End: 2023-03-29
  Administered 2023-03-29: 4 mg via INTRAVENOUS

## 2023-03-29 MED ORDER — SODIUM CHLORIDE 0.9 % IV BOLUS
INTRAVENOUS | Status: DC | PRN
Start: 2023-03-29 — End: 2023-03-29

## 2023-03-29 MED ORDER — KETOROLAC TROMETHAMINE 30 MG/ML INJ
Status: AC
Start: 2023-03-29 — End: 2023-03-29
  Filled 2023-03-29: qty 1

## 2023-03-29 MED ORDER — FENTANYL CITRATE 0.05 MG/ML IJ SOLN
INTRAMUSCULAR | Status: AC
Start: 2023-03-29 — End: 2023-03-29
  Filled 2023-03-29: qty 2

## 2023-03-29 MED ORDER — BUSPIRONE HCL 5 MG PO TABS
5.0000 mg | ORAL_TABLET | Freq: Two times a day (BID) | ORAL | Status: DC
Start: 2023-03-29 — End: 2023-04-01
  Administered 2023-03-29 – 2023-04-01 (×7): 5 mg via ORAL
  Filled 2023-03-29 (×7): qty 1

## 2023-03-29 MED ORDER — HYDROMORPHONE HCL 2 MG/ML IJ SOLN (SUPER ERX)
Status: AC
Start: 2023-03-29 — End: 2023-03-29
  Filled 2023-03-29: qty 1

## 2023-03-29 MED ORDER — HYDROCORTISONE 2.5 % EX CREA
TOPICAL_CREAM | Freq: Two times a day (BID) | CUTANEOUS | Status: DC | PRN
Start: 2023-03-29 — End: 2023-04-01
  Filled 2023-03-29: qty 30

## 2023-03-29 MED ORDER — MIDAZOLAM HCL 2 MG/2 ML IJ SOLN
INTRAMUSCULAR | Status: AC
Start: 2023-03-29 — End: 2023-03-29
  Filled 2023-03-29: qty 2

## 2023-03-29 MED ORDER — HALOPERIDOL LACTATE 5 MG/ML IJ SOLN
2.0000 mg | INTRAMUSCULAR | Status: DC | PRN
Start: 2023-03-29 — End: 2023-03-29
  Administered 2023-03-29: 2 mg via INTRAVENOUS
  Administered 2023-03-29: 1 mg via INTRAVENOUS
  Administered 2023-03-29: 2 mg via INTRAVENOUS

## 2023-03-29 MED ORDER — FAMOTIDINE 20 MG PO TABS
20.0000 mg | ORAL_TABLET | Freq: Two times a day (BID) | ORAL | Status: DC
Start: 2023-03-29 — End: 2023-04-01
  Administered 2023-03-29 – 2023-04-01 (×7): 20 mg via ORAL
  Filled 2023-03-29 (×7): qty 1

## 2023-03-29 MED ORDER — KETAMINE HCL 10 MG/ML IJ SOLN
INTRAMUSCULAR | Status: AC
Start: 2023-03-29 — End: 2023-03-29
  Filled 2023-03-29: qty 20

## 2023-03-29 MED ORDER — LORAZEPAM 2 MG/ML IJ SOLN
2.0000 mg | INTRAMUSCULAR | Status: DC | PRN
Start: 2023-03-29 — End: 2023-03-29
  Administered 2023-03-29: 2 mg via INTRAVENOUS
  Filled 2023-03-29: qty 1

## 2023-03-29 MED ORDER — HALOPERIDOL LACTATE 5 MG/ML IJ SOLN
INTRAMUSCULAR | Status: AC
Start: 2023-03-29 — End: 2023-03-29
  Filled 2023-03-29: qty 1

## 2023-03-29 MED ORDER — MIDAZOLAM HCL 2 MG/2 ML IJ SOLN
0.5000 mg | INTRAMUSCULAR | Status: DC | PRN
Start: 2023-03-29 — End: 2023-03-29

## 2023-03-29 MED ORDER — APIXABAN 2.5 MG PO TABS
2.5000 mg | ORAL_TABLET | Freq: Two times a day (BID) | ORAL | Status: DC
Start: 2023-03-30 — End: 2023-04-01
  Administered 2023-03-30 – 2023-04-01 (×5): 2.5 mg via ORAL
  Filled 2023-03-29 (×5): qty 1

## 2023-03-29 MED ORDER — FENTANYL CITRATE 0.05 MG/ML IJ SOLN
Freq: Once | INTRAMUSCULAR | Status: DC | PRN
Start: 2023-03-29 — End: 2023-03-29
  Administered 2023-03-29: 100 ug via INTRAVENOUS

## 2023-03-29 MED ORDER — BUPROPION HCL ER (SR) 100 MG PO TB12
200.0000 mg | ORAL_TABLET | Freq: Two times a day (BID) | ORAL | Status: DC
Start: 2023-03-29 — End: 2023-04-01
  Administered 2023-03-29 – 2023-04-01 (×7): 200 mg via ORAL
  Filled 2023-03-29 (×7): qty 2

## 2023-03-29 MED ORDER — ATORVASTATIN CALCIUM 80 MG PO TABS
80.0000 mg | ORAL_TABLET | Freq: Every day | ORAL | Status: DC
Start: 2023-03-29 — End: 2023-04-01
  Administered 2023-03-29 – 2023-03-31 (×3): 80 mg via ORAL
  Filled 2023-03-29 (×3): qty 1

## 2023-03-29 MED ORDER — OXYCODONE HCL 5 MG PO TABS
5.0000 mg | ORAL_TABLET | Freq: Once | ORAL | Status: AC
Start: 2023-03-29 — End: 2023-03-29
  Administered 2023-03-29: 5 mg via ORAL
  Filled 2023-03-29: qty 1

## 2023-03-29 MED ORDER — PANTOPRAZOLE SODIUM 40 MG PO TBEC
40.0000 mg | DELAYED_RELEASE_TABLET | Freq: Every day | ORAL | Status: DC
Start: 2023-03-29 — End: 2023-04-01
  Administered 2023-03-29 – 2023-04-01 (×4): 40 mg via ORAL
  Filled 2023-03-29 (×4): qty 1

## 2023-03-29 MED ORDER — CETIRIZINE HCL 10 MG PO TABS
10.0000 mg | ORAL_TABLET | Freq: Every day | ORAL | Status: DC
Start: 2023-03-29 — End: 2023-04-01
  Administered 2023-03-30 – 2023-04-01 (×3): 10 mg via ORAL
  Filled 2023-03-29 (×4): qty 1

## 2023-03-29 MED ORDER — LABETALOL HCL 5 MG/ML IV SOLN
10.0000 mg | Freq: Two times a day (BID) | INTRAVENOUS | Status: DC | PRN
Start: 2023-03-29 — End: 2023-04-01
  Administered 2023-03-29: 10 mg via INTRAVENOUS
  Filled 2023-03-29: qty 20

## 2023-03-29 MED ORDER — EPHEDRINE SULFATE 25 MG/5 ML-% IV SOSY (SUPER ERX)
PREFILLED_SYRINGE | Freq: Once | Status: DC | PRN
Start: 2023-03-29 — End: 2023-03-29
  Administered 2023-03-29: 5 mg via INTRAVENOUS
  Administered 2023-03-29: 10 mg via INTRAVENOUS

## 2023-03-29 MED ORDER — SENNOSIDES 8.6 MG PO TABS
17.2000 mg | ORAL_TABLET | Freq: Every evening | ORAL | Status: DC
Start: 2023-03-29 — End: 2023-04-01
  Administered 2023-03-29 – 2023-03-31 (×3): 17.2 mg via ORAL
  Filled 2023-03-29 (×3): qty 2

## 2023-03-29 SURGICAL SUPPLY — 27 items
3/4 SHEET DRAPE 56X77IN (DRAPE) ×1 IMPLANT
CABLE AND SLEEVE SET ×1 IMPLANT
CERAMENT BEAD TRAY ×1 IMPLANT
CERAMENT BONE MATRIX IMPLANT ×1 IMPLANT
DRSG,OPTIFOAM 4X6" MSC9746 (DRESSING) ×1 IMPLANT
FRAZIER SUCTION 18FR (SUCTION) ×1 IMPLANT
GARMENT,LOWER LEG,17,L501-M (VASCCOMP) ×1 IMPLANT
IMPERVIOUS U-DRAPE 76X54 (DRAPE) ×2 IMPLANT
IRRISEPT WOUND DEBRIDEMENT (WOUND) IMPLANT
ISO U-DRAPE 122CMX122CM (DRAPE) ×1 IMPLANT
KNEE IMMOBILIZER 24IN (THERAPY) ×1 IMPLANT
OPEN LAPAROTOMY PACK (PACK) ×1 IMPLANT
ORTHO PORTEXIS PI GLOVE SZ8.5 (GLOVE) ×2 IMPLANT
PLUMEPEN ELITE SMOKE EVAC (SMKEVAC) ×1 IMPLANT
POVIDONE IODINE SWABSTICKS (MEDSUP) ×1 IMPLANT
SPLIT SHEET DRAPE 76"X120" (DRAPE) ×2 IMPLANT
SPONGE LAP,XRAY,RFID (SURGSPG) ×3 IMPLANT
STRATAFIX 2-0/TS 3-0 PGA/PCL2 (SUTURE) ×1 IMPLANT
STRATAFIX SPIRAL MONCRYL + SUT (SUTURE) ×2 IMPLANT
STRATAFIX SUTURE SPIRAL 3-0 (SUTURE) ×1 IMPLANT
SURGEON BLADE #10 (SURGBLA) ×2 IMPLANT
SURGEON GLOVE LF/PF SIZE 7.0 (GLOVE) ×2 IMPLANT
SUTURE SURGIPRO 3-0 P12 18IN (SUTURE) ×3 IMPLANT
SZ 7.5 L/F ORTH SURG GLOVE (GLOVE) ×1 IMPLANT
SZ 8.5 L/F ORTH SURG GLOVE (GLOVE) ×2 IMPLANT
TIB BEARING INSERT X3 9MM SZ 4 ×1 IMPLANT
TOP DRAPE 102X53 STERILE (DRAPE) ×1 IMPLANT

## 2023-03-29 NOTE — Anesthesia Preprocedure Evaluation (Addendum)
Pre-Anesthetic Note  .      Patient: Cheryl Dixon is a 67 year old female with PMH CVA 12/19/2021, COPD, aortic aneurysm, preDM, OAB, anxiety/depression, HLD, OUD on suboxone who presents for TELEPHONE SCREEN (per patient request) preanesthesia evaluation prior to Right TKA with Dr Sallee Lange 03/11/2023 under spinal anesthesia w/ block. NO previous issues with anesthesia reported. Seen by pcp 03/05/2023 for preop clearance.     Procedure Information       Date/Time: 03/29/23 0830    Procedures:       REPAIR, TENDON, right PATELLAR (Right)      REVISION, TOTAL ARTHROPLASTY, KNEE, poly exchange (Right)    Diagnosis:       Patellar tendon rupture, right, initial encounter [Z61.096E]      Status post total right knee replacement [Z96.651]    Pre-op diagnosis:       Patellar tendon rupture, right, initial encounter [A54.098J]      Status post total right knee replacement [Z96.651]    [facility]    Surgeons: Oretha Caprice, MD            Relevant Problems   PULMONARY   (+) Chronic obstructive pulmonary disease (HCC)      NEURO/PSYCH   (+) Cerebrovascular accident (CVA) (HCC)      CARDIO   (+) Aneurysm of ascending aorta without rupture (HCC)   (+) Essential hypertension           Previous Anesthetic History:   Past Surgical History:  2023: COLONOSCOPY  No date: ENDOMETRIAL ABLTJ THERMAL W/O HYSTEROSCOPIC GID  No date: PR ANES HRNA REPAIR UPR ABD TABDL RPR DIPHRG HRNA  No date: PR ANES IPER LOWER ABD W/LAPS RAD HYSTERECTOMY  No date: RPR 1ST INGUN HRNA PRETERM INFT RDC        Medications  Current Facility-Administered Medications   Medication   . acetaminophen (TYLENOL) tablet 1,000 mg   . atorvastatin (LIPITOR) tablet 80 mg   . buprenorphine-naloxone (SUBOXONE) 8-2 MG SL tablet 2.5 tablet   . buPROPion (WELLBUTRIN SR) 12 hr tablet 200 mg   . busPIRone (BUSPAR) tablet 5 mg   . cephALEXin (KEFLEX) capsule 250 mg   . cetirizine (ZYRTEC) tablet 10 mg   . famotidine (PEPCID) tablet 20 mg   . hydrocortisone 2.5 %  cream   . pantoprazole (PROTONIX) EC tablet 40 mg   . polyethylene glycol (GLYCOLAX/MIRALAX) packet 17 g   . tiotropium (SPIRIVA RESPIMAT) inhaler 2 puff   . olodaterol (STRIVERDI RESPIMAT) inhaler 2 puff   . cholecalciferol (VITAMIN D3) tablet 2,000 Units   . morphine injection 2 mg         Allergies:   Review of Patient's Allergies indicates:   Darvon                     Meperidine hcl              Comment:Nausea/vomit   Paxil [paroxetine]      Rash   Zoloft [sertraline *    Rash    Smoking, Alcohol, Drugs:  Social History    Tobacco Use      Smoking status: Former        Packs/day: 0.00        Years: 1 pack/day for 32.0 years (32.0 ttl pk-yrs)        Types: Cigarettes        Start date: 09/26/1989  Quit date: 09/26/2021        Years since quitting: 1.5        Passive exposure: Never      Smokeless tobacco: Never      Tobacco comments: quit 1980-1996, then light until 2003, then 1 ppd since    Alcohol use: No    Drug use:         Types:   Marijuana      Comment:   marijuana- last dose sunday 03/08/23 past opioids, nasal heroin, 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  MJ 1x per week          PMHx:  Past Medical History:  No date: Anxiety  No date: Arthritis  No date: COPD (chronic obstructive pulmonary disease) (HCC)  No date: Depression  No date: Obese  No date: Substance addiction (HCC)  No date: Varicose veins of lower extremities    Vitals  BP 155/81   Pulse 72   Temp 97.5 F (36.4 C) (Oral)   Resp 18   Wt 90.7 kg (200 lb)   LMP 11/20/1991   SpO2 96%   BMI 37.79 kg/m     Review of Systems     Patient summary reviewed and Nursing notes reviewed      Anesthetic History:   negative anesthesia history ROS           Cardiovascular:  Positive for hypercholesterolemia and hypertension. Negative for chest pain and palpitations.   Physical Activity in METs greater than 4 (stationary bike 10 min several times daily, laundry, light housecleaning)    Pulmonary: Positive for COPD (never hospitalized) and tobacco use (quit 2023).  Negative for obstructive sleep apnea and recent URI.   GU/Renal:  Positive for frequency (OAB). Negative for kidney disease.   Hepatic: Skin negative for hepatic disease.    Neurological:  Positive for gait problem (cane, no recent falls) and strokes (12/19/2021). Negative for headaches, seizures, mononeuropathy and dizziness.        CVA 8/23, no residual no mononeuropathy  Gastrointestinal:  Positive for constipation (well managed on meds) and GERD.   Hematological: Negative for DVT, pulmonary embolism and bleeding disorder.        Had transfusion reaction (fever) 30+ yrs ago post op hernia repair    Endocrine: Positive for Diabetes (preDM HgA1c 5.6 08/20/22).   Head:  Negative for neck stiffness.   Eyes:  Negative for glaucoma.   Ears:  Negative for hearing loss.   Throat/Mouth:  Positive for sore throat (dry mouth assoc with inhalers). Negative for dental problems (upper full) and trouble swallowing.   Musculoskeletal:  Positive for arthralgias (Right knee OA, left shoulder pain sec to cane use-pt is left handed).   Psychiatric:  Positive for depression, anxiety, substance use, marijuana use (edibles 20mg  at HS 2-3 x/week) and opiate use (on suboxone Dr Berneice Gandy Miller/Roll, last use opiates, nasal heroin 40yrs).    Constitutional: Negative for constitutional diseases.  Negative for unexpected weight change.   Skin: Negative for skin diseases.        Physical Exam:     General     Level of consciousness:  Alert and oriented (time, person, place)     BMI   BMI greater than 30 kg/m2      Airway     Mallampati:  II    TM distance:  >3 FB    Mouth opening:  >3 FB    Neck ROM:  Full     Teeth    (+)  upper dentures    }     Heart  - normal exam       Lungs - normal exam  Breath sounds clear to auscultation          HEENT: normal exam  head/ face normal     Abdomen: normal exam    soft  bowel sounds normal                         Pertinent Labs:   Lab Results   Component Value Date    NA 136 03/28/2023    K 4.2  03/28/2023    CREAT 0.9 03/28/2023    GLUCOSER 124 03/28/2023    WBC 7.9 03/28/2023    HCT 32.0 (L) 03/28/2023    PLTA 451 (H) 03/28/2023    PT 12.2 12/12/2021    APTT 26.1 02/16/2007    INR 1.1 12/12/2021         Anesthesia Plan    ASA Score:     ASA:  3    Airway:      Mallampati:  II    Mouth opening:  >3 FB    Neck ROM:  Full    TM distance:  >3 FB     Plan: general    Other information:     EKG Reviewed: : Yes      Full Stomach Precaution:: No      Post-Plan::  Floor    Anesthesia Assessment and Plan:        GA lma     Informed Consent:     Anesthetic plan and risks discussed with:  Patient   Patient Consented        Attending Anesthesiologist Statement:     Reassessed day of surgery: Yes        Assessment made, necessary equipment and appropriate plan in place.

## 2023-03-29 NOTE — Addendum Note (Signed)
Addendum  created 03/29/23 1414 by Arnoldo Lenis, MD    Clinical Note Signed, Review and Sign - Ready for Procedure

## 2023-03-29 NOTE — Surgery Post-Op (Addendum)
ORTHOPEDIC SURGICAL POST OP NOTE    This patient is Cheryl Dixon who is a 67 year old female.    POST OPERATIVE CARE PLAN:    Procedure- S/p right knee patella tendon repair and polyethylene liner exchange    Surgeon- Dr. Lennie Muckle    Any pertinent intraoperative events- none. PICO dressing on knee.  Please check with anesthesia for any PACU events.    POST OP CARE THAT WILL BE MANAGED BY MEDICINE:  Medicine team will be responsible for entering the following orders.  These are our requests/ recommendations.    Anticoagulation- recommend Eliquis 2.5mg  BID x 4 weeks     Preadmission Medications to be held & started at a later date (ie ASA, NSAIDS, methotrexate)-       Recommendations for pain meds- multimodal pain regimen, tylenol, oxycodone, iv morphine for bt pain     Diet- please order diet beyond clear liquids      POST OP CARE THAT WILL BE MANAGED BY ORTHOPEDICS:  Orthopedic team will be responsible for entering the following orders.    Wound care- Leave post op dressing on and it will be managed by ortho team until further notice.    Perioperative Antibiotics- n/a    Weight bearing status- WBAT RLE with KI AT ALL TIMES using a walker. ABSOLUTELY NO BENDING OF KNEE.    Anticipated rehab/ d/c plan- Ortho team will order the PT consult.  We will also schedule all post op appointments, which will appear on discharge paperwork.       MISCELLANEOUS COMMUNICATION:    Whenever possible, please remove the foley on POD#1.      If the MRSA/MSSA PCR was positive, please continue Mupirocin ointment BID to both nares for total of 5 days.    Please see Patient Instructions in the Discharge Navigator for detailed wound care, end date of home anticoagulation plans, diagnosis specific post-op instructions and follow up appointments.    Other-    Ortho Contact Person- Please refer to [employer] for [facility] and [facility] Orthopedic coverage.     Lujean Amel, PA-C, 03/29/2023

## 2023-03-29 NOTE — Plan of Care (Signed)
Pt admitted to floor from ER A&OX3 this shift with RLE knee immobilizer in place and wrapped in ACE dsg. + pp present with dermal sensation intact. Pt is able to move foot and wiggle toes. C/O 8/10 pain and was given Toradol 15Mg  IV for pain. Pt instructed not to attempt ambulation on RLE.Possible OR in the am anticipated.VSS with CB in reach. Will continue to monitor.

## 2023-03-29 NOTE — Plan of Care (Addendum)
Pt is back from PACU S/P Right knee patella tendon repair and polyethylene Liner exchange, somnolence, on cont pulse ox, on 1L oxygen via N/C 02sat:97%, respiration even and unlabored, no facial grimacing noted, Pico dressing on knee and immobilizer remains in place, safety maintained, bed in low position.  Problem: Safety  Goal: Free from accidental physical injury  Outcome: Progressing  Goal: Free from intentional harm  Outcome: Progressing     Problem: Daily Care  Goal: Daily care needs are met  Description: Assess and monitor ability to perform self care and identify potential discharge needs.  Outcome: Progressing     Problem: Pain  Goal: Patient's pain/discomfort is manageable  Description: Assess and monitor patient's pain using appropriate pain scale. Collaborate with interdisciplinary team and initiate plan and interventions as ordered. Re-assess patient's pain level 30 - 60 minutes after pain management intervention.   Outcome: Progressing     Problem: Compromised Skin Integrity  Description: Use this problem when pressure ulcers are classified as stage I or II.  Goal: Skin integrity is maintained or improved  Description: Assess and monitor skin integrity. Identify patients at risk for skin breakdown on admission and per policy. Collaborate with interdisciplinary team and initiate plans and interventions as needed.  Outcome: Progressing     Problem: Knowledge Deficit  Goal: Patient/S.O. demonstrates understanding of disease process, treatment plan, medications, and discharge instructions.  Description: Complete learning assessment and assess knowledge base  Outcome: Progressing     Problem: Discharge Barriers  Goal: Patient's continuum of care needs are met  Description: Collaborate with interdisciplinary team and initiate plans and interventions as needed.   Outcome: Progressing

## 2023-03-29 NOTE — Op Note (Signed)
Date of Surgery:  03/29/2023    Surgery: Open repair right patellar tendon rupture repair, irrigation and excisional debridement, antibiotic bead placement, extensor tendon reinforcement with cerclage cable, closure of grade 3 open dehiscence of total knee arthroplasty wound and poly exchange revision of the total knee    Pre-Operative Diagnosis:   Right knee acute grade 3 open dehiscence of her total knee arthroplasty down to the implant  Acute patellar tendon rupture    Post-Operative Diagnosis:  Right knee acute grade 3 open dehiscence of her total knee arthroplasty down to the implant  Acute patellar tendon rupture    Surgeon: Ignatius Specking. Lennie Muckle MD    Assistant: Lujean Amel PA-C    Type of Anesthesia:   General     Estimated Blood Loss:   200cc    Complications:  None    Implants:  Fiberwire suture, Cerament G antibiotic beads, 1.6 mm cerclage cable, removal and reimplantation of appropriately sized poly    Indications:  The patient is a 67 year old female with a number of medical problems who suffered a acute right grade 3 wound dehiscence with patellar tendon rupture.  The patient had previously been booked for surgery tomorrow for reconstruction of the patellar tendon and repair however she had another fall and sustained this acute open injury overnight.  Given this the patient was made n.p.o. and the decision was made to proceed with open irrigation and debridement and repair of the patellar tendon with polyethylene insert exchange/revision.  Patient understands the risks and benefits of the surgical procedure including infection, neurovascular injury, compartment syndrome, need for further surgery, persistent pain, stiffness, need for revision, loss of limb etc.  Consent was confirmed reviewing the risks above as well as anesthesia related risks of MI stroke DVT PE etc.    Description of procedure:  The patient was identified as the correct patient in the pre op holding area and he identified right knee  as the correct operative site.  Timeout was done confirming side side surgery to be done as well as preoperative antibiotics.  I had marked the right leg.  The patient had a Venodyne placed on the opposite side.  She received 2 g of IV cephalosporin and 1G IV Vanco.  No tourniquet was used.  The patient's leg was sterilely prepped and draped in typical fashion using a iodine prescrub and an iodine prep.  Patient was positioned supine.  Patient's leg was placed on a foam positioner.  An impermeable stockinette was placed over the patient's foot.  Multiple layers of impermeable drapes were placed.    Patient had an open wound involving the entirety of her total knee incision.  The wound was debrided thoroughly excising all nonviable appearing tissue there was a copious amount of hematoma.  We then proceeded to evacuate all of this and excised using a rongeur and a curette.  The patient also had her polyethylene insert removed.  We then proceeded to perform thorough irrigation debridement using 6 L of sterile normal saline and 2 bottles of Irrisept.  After we had performed this thorough excisional debridement we visualized the tendon.  It was noted that the tendon rupture was mid substance.  We then proceeded to replace the polyethylene liner. It was placed after the thorough irrigation debridement.  We then proceeded to pack the gutters with antibiotic beads, Cerament G.  After we did this we then proceeded to perform repair of the tendon and reinforcement with FiberWire #5.  Thorough  repair was done using the #5 FiberWire in a modified Krakw stitch.  After we had repaired the tendon we then proceeded to reinforce this repair with a 1.6 cerclage cable passed through a bone tunnel and around the superior pole of the patella.  This proceeded to maintain the position of the patella and reinforce our repair.  We then performed a repair of both the medial and lateral retinaculum also using a #5 FiberWire.  After this had  been done we then proceeded to close the subcutaneous tissues with a 1 strata fix followed by 2-0 strata fix followed by 3-0 running Prolene in the skin.  We are pleased overall with the repair and the closure that we achieved.  It was noted that the wound was also revised as there was an area of poor appearing skin at the distal aspect of the wound that was resected.  We are able to achieve good closure and applied an incisional wound VAC.  The patient was then placed in an Ace bandage.  Patient was placed in a knee immobilizer.  The patient will remain weightbearing as tolerated in full extension and follow our quadriceps tendon protocol for rehabilitation.  We called the patient's family at the end of the procedure. The patient was brought awake and stable to PACU.  There were no complications.  I was present for and performed the entire above-described procedure.  Total EBL was 200 cc.  I was present for and performed all critical portions of the procedure.  Lujean Amel PA-C was a necessary assistant to the procedure as no qualified orthopedic resident was available. Lujean Amel PA-C assisted in suction, visualization, irrigation, retraction, positioning of screws, closure, transfer etc.     Patient can be discharged home 24 hours of antibiotics given the open nature of the injury and considerable risk to the arthroplasty for contamination.  Patient will receive prophylactic anticoagulation.      Unusual Procedure: Given the complexity of this injury due to being an open traumatic patellar and retinacular rupture as well as the need for revision of the polyethylene liner and reinforcement with a cerclage cable this case took approximately 3 times as long as a standard procedure and therefore qualifies for modifier 22.
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
## 2023-03-29 NOTE — Progress Notes (Signed)
PROGRESS NOTE  03/29/2023    PATIENT INFO: Cheryl Dixon, English-SPEAKING 67 year old female  DATE OF ADMISSION: 03/29/23    ROOM/BED LOCATION:  [facility] OR/[facility]  HOSPITAL DAY: 0    REVIEW OF OVERNIGHT EVENTS:  NAE   Admitted to the medicine service    SUBJECTIVE:  Reports 6/10 knee pain this morning only controlled with morphine. Denies fevers, chills, nausea, vomiting.     OBJECTIVE:    VITAL SIGNS      03/29/23  0820 03/29/23  0824 03/29/23  0838 03/29/23  0942   BP: 135/73 135/73  142/84   Pulse: 69 69 69 65   Resp:  17  16   Temp: 98.1 F (36.7 C)   98.1 F (36.7 C)   TempSrc:    Temporal   SpO2: 97%   97%   Weight:           INS/OUTS (PAST 24 HOURS)  I/O 24 Hrs:  In: 250 [IV Piggyback:250]  Out: -     PHYSICAL EXAM:  Gen: NAD.  Well appearing.  Well-nourished.  Speaking full sentence.  Mood appropriate for age and situation.  Thought process organized and structured.   HEENT: PERRLA, EOMI, MMM.  CV: RRR. Normal S1, S2. No m/r/g.  Lungs: Normal work of breathing. CTAB.  Back: No CVA tenderness.  Abd: NABS. Soft, NTND.   Ext: Warm. Left - No edema. Right Leg: significant swelling on R knee and lower leg, bandage on R knee saturated with blood, calf soft, pulses distally intact.  Neuro: A&Ox4. CN 2-12 grossly intact. 5/5 UE and LE strength.  Skin: No rashes.  T/L/D:   T/L/D:   Peripheral IV 03/28/23 Anterior;Distal;Left Upper arm (Active)   Number of days: 0       Supraglottic Airway LMA 4 (Active)   Secured Location Center 03/29/23 0954   Secured by Caron Presume tape 03/29/23 0954   Number of days: 0       RECENT LABS:  Chemistries:  Recent Labs     03/28/23  2359   NA 136   K 4.2   CO2 26   BUN 19*   CREAT 0.9   CA 8.8   ANION 10   GFR > 60    GI:  No results for input(s): "AST", "ALT", "TBILI", "DBILI", "IBIL", "ALKPHOS", "GGTP", "ALBUMIN", "LIP" in the last 72 hours.   CBC:  Recent Labs     03/28/23  2359   WBC 7.9   HGB 9.8*   HCT 32.0*   PLTA 451*    Coags:   No results for input(s): "INR", "PT", "APTT", "HPTT", "FIB" in  the last 72 hours.   Trops, BNP, D-dimer, Lactate, C-RP:  No results for input(s): "TROPTHS", "TROPTHSDELTA", "PROBNP", "DDPEDVT", "DD", "LACTICACID", "CRP" in the last 72 hours.    Finger Sticks:  No results for input(s): "FINGERSTICKR" in the last 72 hours. Urinalysis:  No results for input(s): "UACOL", "UACLA", "UAGLU", "UABIL", "UAKET", "SPEGRAVURINE", "UAOCC", "UAPH", "UAPRO", "UABAC", "UARBC", "UAWBC", "UANIT", "LEUKOCYTES", "UAMIC", "UASQE" in the last 72 hours.    Invalid input(s): "UAOBU"       MICROBIOLOGY REVIEW  No new microbiology results to review.    IMAGING AND OTHER STUDIES  No new imaging or other studies to review.  Telemetry notable findings:     MEDICATIONS  See list in EPIC    ASSESSMENT & PLAN   Cheryl Dixon is a 67 year old English-speaking female with COPD, anxiety, aortic aneurysm, CVA  on Eliquis, OUD on suboxone, who is s/p right TKA who presents with R knee pain and bleeding after mechanical fall and found to have R knee infrapatellar rupture and hemorrhage.        #Right knee injury  #R Knee pattellar rupture   #R Knee Hemarthrosis  Patient 17 days post TKA presents to the ED after a fall and reported injury to her right knee.    Patient endorses severe pain right knee and reported bleeding from anterior area.   On assessment significant swelling on R knee and lower leg, pulses preserved.  ON R knee CT hematoma and inferior right patellar disruption noted.  Patient clinical presentation is likely 2/2 to her hx of recent falls.   Pt evaluated by Ortho, surgical Right patellar repair for 12/01 morning.   She is medically cleared for surgery. Her pre-op EKG ~2 weeks ago which was reviewed by the medicine team, did not feel a repeat EKG was warranted.   -Orthopedics consulted, appreciate recs  -OR this morning for Dixon tendon repair and polyexchange of the TKA  -N.p.o.   -Hold Eliquis  -Hold ASA  -Morphine 2 mg IV every 3 hours as needed  -Toradol 15 mg once PRN  - Tylenol 1000g every 8  hours  - Cephalexin 250 p.o. 4 times a day      #Falls   #Status post TKA - right knee  # OA, bilateral Knee    #Osteoporosis  Pt w/ hx of OA, Pt S/P TKA right knee  reported 2 recent falls, stated that her right leg given out on her. On vitamin D3 and Risedronate 150 mg monthly. Eliquis 2.5 mg BID.   Denies vision changes hearing impairment dizziness chest pain.   -PT consult     #Normocytic Anemia  Hb 9.8  from 11.1 and   Htc 32 from 38. ( From 03/12/23).   One point drop in Hb noted and that might be in context of pt 's recent bleeding episode.  - continue monitoring CBC  -follow up iron studies     Chronic:  #Aortic Aneurism (ascending aorta), 4.5 cm on CT 11/19/2022.  Patient w/ hx of aortic aneurysm, asymptomatic at this time.     #CVA on 11/2021 (Thrombose of cerebral artery, on Eliquis)   Pt On Eliquis 2.5 mg BID,  surgical intervention scheduled for 12/1  -Hold Eliquis  -Atorvastatin 80 mg daily     #COPD:   Pt w/ hx Copd. Asymptomatic at this time.   Continue home medication  -Stiolto Respimat 2.5 - 2.5  -Albuterol inhaler   -DuoNeb as needed     #OUD on soboxone  Hx of Opioid use disorder, in remission.   - Continue home Suboxone.     #MDD  -Wellbutrin 200 p.o. twice daily     #Social/Family     - Lives alone      #Transitional issues:     - ortho out patient follow up      # Tele: No  # Foley: No  # FEN    # DVT ppx: Eliquis   # Pain: morphine                Toradol               Tylenol   # Bowel regimen: Miralax  # Language: English   # Dispo: Ward for right Knee surgery.  Medication Reconciliation: Done    Code Status: Full Code  Health  care proxy: Cheryl Dixon, Cheryl Dixon  [phone]      Discussed with attending, Serafina Royals, MD    Patton Salles MD   PGY-1    03/29/2023

## 2023-03-29 NOTE — Nursing Note (Signed)
Pt chronic pain pt. On arrival, agitated and in pain. Multiple meds given. See MAR. On d/c,  Pt drowsy and calm but arouses easily.

## 2023-03-29 NOTE — Plan of Care (Signed)
Problem: Pain  Goal: Patient's pain/discomfort is manageable  Description: Assess and monitor patient's pain using appropriate pain scale. Collaborate with interdisciplinary team and initiate plan and interventions as ordered. Re-assess patient's pain level 30 - 60 minutes after pain management intervention.   Flowsheets (Taken 03/29/2023 2221)  Pain Score: 3   Pain Location: Knee  Pain Orientation: Right  Pain Intervention(s): None Needed  Pain Descriptors: Aching  Pain Onset: On-going  Pain Frequency: Continuous  Patient's Stated Pain Goal: 3  Effect of Pain on Daily Activities: Comfort  Multiple Pain Sites: No  Pain Type:   Acute pain   Surgical pain  Clinical Progression: Gradually improving  Response to Interventions: Resting  Note: Pt continues to sleep this evening after day surgery to repair Rt ruptured Patella.RLE wrapped in ace dsg is CD&I with PICO negative pressure device in place and functioning appropriately. Cold pack applied. + pp with doppler. Dermal sensation intact. Pain currently managed with scheduled Tylenol and Hydromorphone 0.4 Mg IV prior to taking over care. Pt is easily aroused and falls back to sleep with NAD observed. O2 sat maintained on R/A. Taking pills without difficulty and thin liquids. Daughter Kennyth Arnold called for updates. CB in reach with bed alarm on. Will continue to monitor,bed alarm on.

## 2023-03-29 NOTE — Care Coordination (Addendum)
Discussed during MDR, CM unable to see pt, currently in OR. Chart reviewed instead, 68 year old English-speaking female with PMH COPD, anxiety, aortic aneurysm, CVA on Eliquis, OUD on suboxone, who is 17 days s/p right TKA and 2 days s/p right patellar tendon rupture, to the ED by ambulance after a fall at home. Pt lives alone in an elevator accessible apartment. Pt is active with [facility] for Home PT, spoke with Shanae from intake, notified re admission. Pt scheduled for OR repair today, dispo pending final PT recs. [facility] TOC RN Luther Parody notified via email). CM will continue to follow     Readmission Assessment  What Were The Most Important Contributors To The Patient's Readmission: Medical disease process   Readmitted s/p fall resulting to R patellar tendon rupture  Medical Disease Process  Explain Medical Disease Process: New medical problem (comment which one)                                            03/29/23 1145   Admission   Reviewed for admission Yes   Observation Notice delivered to patient No   Does patient have prescription drug coverage? Yes   Reason for Admission R patellar tendon rupture   Readmission Assessment  Yes    Readmission Assessment   What Were The Most Important Contributors To The Patient's Readmission Medical disease process   Medical Disease Process   Explain Medical Disease Process New medical problem (comment which one)   Mental Status Upon Admission   Mental Status Upon Admission AO   Demographics   Demographic Information Correct Yes   Patient Status   Is the patient own decision-maker? Yes   Is a Social Work consult needed for Health Care Proxy or Guardianship? No   Caregiver   Does the patient have a caregiver? No   Psychosocial   Admitted From: Home   Home Home w/ VNA services   Home w/ VNA [facility] - Care [facility]   Challenges Adjustment to diagnosis/injury/illness   Relationship Status Divorced   Primary Caretaker for Someone No   Providing self care at home? Yes   Functional Screen    Toileting UTA   Living Situation   Lives with Alone   Living Setting Elevator assessible apartment   Anticipated Discharge Plan   Expected Discharge Date 03/31/23   Home Care Services Yes   Type of Home Care Services Home physical therapy
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
## 2023-03-29 NOTE — ED Provider Notes (Signed)
 EMERGENCY DEPARTMENT PHYSICIAN ASSISTANT NOTE    Vital signs and ED nursing note reviewed.     This patient was seen with Emergency Department attending physician Dr. Christia Reading    CHIEF COMPLAINT    Patient presents with:  Knee Pain      HPI    Cheryl Dixon is a 67 year old female with PMH COPD, anxiety, aortic aneurysm, CVA on Eliquis, OUD on suboxone, who is 17 days s/p right TKA and 2 days s/p right patellar tendon rupture, now presenting to ED via EMS after a mechanical fall at home which occurred just PTA. Pt states she was ambulating to the bathroom using her walker, wearing her right knee brace, when her right leg gave out and she fell.  She denies head strike or LOC.  She complains of increased right knee pain and bleeding. Bleeding controlled on scene by EMS with only slight oozing of dark blood through dressing on my eval.     REVIEW OF SYSTEMS    Pertinent positives and negatives as noted in HPI above    Past Medical History:  Past Medical History:  No date: Anxiety  No date: Arthritis  No date: COPD (chronic obstructive pulmonary disease) (HCC)  No date: Depression  No date: Obese  No date: Substance addiction (HCC)  No date: Varicose veins of lower extremities Current Medications:  Current Facility-Administered Medications   Medication    ceFAZolin (ANCEF) 2 g in sodium chloride 0.9 % 100 mL minibag plus     Current Outpatient Medications   Medication Sig    cephALEXin (KEFLEX) 250 MG capsule Take 1 capsule by mouth in the morning and 1 capsule at noon and 1 capsule in the evening and 1 capsule before bedtime. Do all this for 5 days.    celecoxib (CELEBREX) 200 MG capsule Take 1 capsule by mouth in the morning and 1 capsule before bedtime. Do all this for 14 days.    acetaminophen (TYLENOL) 500 MG tablet Take 2 tablets by mouth every 8 (eight) hours as needed for Pain    oxyCODONE (ROXICODONE) 5 MG immediate release tablet Take 1 tablet by mouth every 8 (eight) hours as needed for Pain Max Daily Amount:  15 mg  for up to 7 days    solifenacin (VESICARE) 10 MG tablet TAKE 1 TABLET BY MOUTH EVERY DAY IN THE MORNING    hydrocortisone 2.5 % cream Apply topically 2 (two) times daily To affected areas of scalp and ears    estradiol (ESTRACE) 0.1 MG/GM vaginal cream PLACE 5 G VAGINALLY 3 TIMES A WEEK MYLAN BRAND ONLY.    famotidine (PEPCID) 20 MG tablet TAKE 1 TABLET BY MOUTH IN THE MORNING AND BEFORE BEDTIME    apixaban (ELIQUIS) 2.5 MG po tablet Take 1 tablet by mouth in the morning and 1 tablet before bedtime.    pantoprazole (PROTONIX) 40 MG tablet Take 1 tablet by mouth daily    buprenorphine-naloxone (SUBOXONE) 8-2 MG sublingual film Take 2.5 films under the tongue daily.    tiotropium-olodaterol (STIOLTO RESPIMAT) 2.5-2.5 MCG/ACT inhal Inhale 2 puffs into the lungs daily    buPROPion (WELLBUTRIN SR) 200 MG 12 hr tablet TAKE 1 TABLET BY MOUTH IN THE MORNING AND BEFORE BEDTIME    cetirizine (ZYRTEC) 10 MG tablet TAKE 1 TABLET BY MOUTH EVERY DAY IN THE MORNING    risedronate (ACTONEL) 150 MG tablet Take 1 tablet by mouth every 30 (thirty) days with full glass of water on empty stomach-nothing else  by mouth and do not lie down for 1/2 hour (Patient not taking: Reported on 03/27/2023)    VITAMIN D3 50 MCG (2000 UT) TABS tablet TAKE 1 TABLET BY MOUTH EVERY DAY IN THE MORNING    atorvastatin (LIPITOR) 80 MG tablet Take 1 tablet by mouth Daily after dinner (Patient not taking: Reported on 03/27/2023)    polyethylene glycol (GLYCOLAX/MIRALAX) 17 g packet Take 1 packet by mouth in the morning. (Patient not taking: Reported on 03/27/2023)    albuterol HFA 108 (90 Base) MCG/ACT inhaler INHALE 2 PUFFS INTO THE LUNGS EVERY 6 HOURS AS NEEDED FOR WHEEZING OR SHORTNESS OF BREATH    ipratropium-albuterol (DUO-NEB) 0.5-2.5 (3) MG/3ML SOLN Inhalation Solution Take 3 mLs by nebulization 4 (four) times daily.         Problem List  Patient Active Problem List:     Opioid dependence on agonist therapy (HCC)     Tobacco use disorder      Fibrocystic breast     Essential hypertension     Knee pain     Chronic low back pain     OA (osteoarthritis) of knee     H. pylori infection     Major depressive disorder, recurrent episode, severe (HCC)     Occult blood in stools     Prediabetes     Epigastric pain     Chronic obstructive pulmonary disease (HCC)     Left foot pain     Multiple polyps of colon     Acquired deformity of toe, right     Dislocation of metatarsophalangeal joint of lesser toe, right, sequela     Postmenopausal atrophic vaginitis     Abnormal loss of weight     Aneurysm of ascending aorta without rupture (HCC)     COVID-19 virus infection     BMI 32.0-32.9,adult     Chronic dysuria     LLQ pain     Osteoporosis     History of endometriosis     Lung cancer screening     Constipation, drug induced     Weakness     Cerebrovascular accident (CVA) (HCC)     Overactive bladder      [facility] [phone] Option #4     Substance addiction (HCC)     Anxiety     Obese     S/P total knee arthroplasty, right     Patellar tendon rupture, right, initial encounter     Status post total right knee replacement   Allergies:  Review of Patient's Allergies indicates:   Darvon                     Meperidine hcl              Comment:Nausea/vomit   Paxil [paroxetine]      Rash   Zoloft [sertraline *    Rash   Surgical History:  Past Surgical History:  2023: COLONOSCOPY  No date: ENDOMETRIAL ABLTJ THERMAL W/O HYSTEROSCOPIC GID  No date: PR ANES HRNA REPAIR UPR ABD TABDL RPR DIPHRG HRNA  No date: PR ANES IPER LOWER ABD W/LAPS RAD HYSTERECTOMY  No date: RPR 1ST INGUN HRNA PRETERM INFT RDC Family History:  Review of patient's family history indicates:  Problem: Heart      Relation: Father          Age of Onset: (Not Specified)          Comment: CAD, died age  60  Problem: Pulmonary      Relation: Father          Age of Onset: (Not Specified)          Comment: COPD  Problem: Alcohol/Drug Abuse      Relation: Father          Age of Onset:  (Not Specified)          Comment: alcohol use disorder  Problem: Mental/Emotional Disorders      Relation: Father          Age of Onset: (Not Specified)          Comment: PTSD  Problem: Heart      Relation: Mother          Age of Onset: (Not Specified)          Comment: CAD  Problem: Heart      Relation: Brother          Age of Onset: (Not Specified)          Comment: CAD, died age 67  Problem: Cancer - Breast      Relation: Maternal Aunt          Age of Onset: (Not Specified)          Comment: age 97  Problem: Cancer - Breast      Relation: Maternal Aunt          Age of Onset: (Not Specified)          Comment: age 28     Social History:  Social History    Tobacco Use      Smoking status: Former        Packs/day: 0.00        Years: 1 pack/day for 32.0 years (32.0 ttl pk-yrs)        Types: Cigarettes        Start date: 09/26/1989        Quit date: 09/26/2021        Years since quitting: 1.5      Smokeless tobacco: Never      Tobacco comments: quit 1980-1996, then light until 2003, then 1 ppd since    Alcohol use: No   Social Factors Significantly Limiting Diagnosis or Treatment:  None identified         PHYSICAL EXAM      Vital Signs: BP 176/100   Pulse 96   Temp 98.6 ?F   Resp 16   Wt 90.7 kg (200 lb)   LMP 11/20/1991   SpO2 99%   BMI 37.79 kg/m?      Physical Exam  Vitals and nursing note reviewed.   Constitutional:       General: She is not in acute distress.  HENT:      Head: Normocephalic.   Eyes:      Conjunctiva/sclera: Conjunctivae normal.   Musculoskeletal:      Comments: RLE: Right knee with medial ecchymosis. Large gaping surgical incision over anterior knee with clot. DP/PT 2+. SILT. Normal ROM of toes and ankle.    Skin:     General: Skin is warm and dry.      Capillary Refill: Capillary refill takes less than 2 seconds.   Neurological:      Mental Status: She is alert.          RESULTS  Results for orders placed or performed during the hospital encounter of 03/28/23 (from the past 24 hours)   CBC,  Platelet &  Differential    Collection Time: 03/28/23 11:59 PM   Result Value    WHITE BLOOD CELL COUNT 7.9    RED BLOOD CELL COUNT 3.52 (L)    HEMOGLOBIN 9.8 (L)    HEMATOCRIT 32.0 (L)    MEAN CORPUSCULAR VOL 90.9

## 2023-03-29 NOTE — Event Note (Incomplete)
signout    89F with COPD, anxiety  17d s/p R TKA  2d R patellar rupture  Planned for OR on Monday  Ambulating to BR using walker; says she was using her brace; she fell  Bleeding, open wound  All clotted on presentation  Spoke with Ortho--iodine soaked dressing, they will try to take to OR   Given oxycodone, diazepam  Ortho did not recommend repeat imaging

## 2023-03-29 NOTE — Progress Notes (Signed)
Pt is alert and awake, VSS, on RA 02sat:94%, c/o R.knee pain, PRN dilaudid given a/o, +CSM on RLE, Ice applied to affected area, tolerated clear liq diet well, uses bedpan multiple times, voiding well, safety maintained, bed in low position, call light within reach.

## 2023-03-29 NOTE — Narrator Note (Signed)
PT stating that the pain and burning in knee is returning. 10/10 pain. PA made aware. And medication ordered.

## 2023-03-29 NOTE — Anesthesia Postprocedure Evaluation (Signed)
Anesthesia Post-Operative Evaluation Note    Patient: Cheryl Dixon           Procedure Summary       Date: 03/29/23 Room / Location: [facility] / [facility]    Anesthesia Start: 0945 Anesthesia Stop: 1149    Procedures:       REPAIR, TENDON, right PATELLAR (Right)      REVISION, TOTAL ARTHROPLASTY, KNEE, poly exchange (Right) Diagnosis:       Patellar tendon rupture, right, initial encounter      Status post total right knee replacement      (Patellar tendon rupture, right, initial encounter [Z61.096E])      (Status post total right knee replacement [A54.098])    Surgeons: Oretha Caprice, MD Responsible Provider: Arnoldo Lenis, MD    Anesthesia Type: general ASA Status: 3              POST-OPERATIVE EVALUATION    Anesthesia Post Evaluation    Vitals signs in patient's normal range: Yes  Respiratory function stable; airway patent: Yes  Cardiovascular function stable: Yes  Hydration status stable: Yes  Mental status recovered; patient participates in evaluation and/or is at baseline: Yes  Pain control satisfactory: Yes  Nausea and vomiting control satisfactory: YesProcedure was labor & delivery no  PostOP disposition PACU  Anesthesia Observation no significant observation    MIPS#404 Anesthesiology Smoking Abstinence:     The patient is a current smoker (e.g. cigarette, cigar, pipe, e-cigarette/vaping/marijuana) (J1914): Yes   The patient underwent an elective surgery or procedure requiring anesthesia (N8295) : Yes   The patient received preop smoking cessation instructions prior to the day of surgery or procedure by MD, APC or RN proxy staff [phone]): Yes   The patient smoked the day of the procedure (Q6578): No  FOR CODING USE ONLY: IF BLANK I6962    MIPS#477 Multimodal Pain Management:  Emergent - Exclusion case: No  Patient was administered multimodal pain management (two or more drugs and/or interventions excluding systemic opioids) in the perioperative period; occurring at some time between 6 hours prior to anesthesia  start time until discharged from PACU (X5284): Yes   FOR CODING USE ONLY: REASON NOT LISTED X3244    WNUU#725 Perioperative Temperature Management:  Anesthesia start to Anesthesia end time was 60 minutes or longer (4255F): Yes   Anesthesia administered was General (Inhalational, or TIVA) or Neuraxial block (D6644): Yes   At least one body temperature greater than 95.34F/35.5C achieved within the 30 mins immediately prior to or the15 mins immediately following anesthesia end time (I3474): Yes   FOR CODING USE ONLY: REASON NOT LISTED Q5956    MIPS#430 Adult Prevention of PONV:  Patient received an inhalational anesthetic (4554F): Yes  Patient exhibits three or more risk factors for PONV (L8756): No  FOR CODING USE ONLY: REASON NOT LISTED E3329    JJOA#416 Pediatric Prevention of PONV:  Pediatric patient?: No  FOR CODING USE ONLY: REASON NOT LISTED S0630        [employer] # [id number]      Last vitals    BP: 142/84 (03/29/2023  9:42 AM)  Temp: 98.1 F (36.7 C) (03/29/2023  9:42 AM)  Pulse: 65 (03/29/2023  9:42 AM)  Resp: 16 (03/29/2023  9:42 AM)  SpO2: 97 % (03/29/2023  9:42 AM)

## 2023-03-29 NOTE — Progress Notes (Addendum)
Assumed care of pt at 7:00 AM, pt is AOX4, Bleeding from R. Knee noted this AM , dressing saturated with blood, MD notified, MD came at bedside, dressing was reinforced by MD at bedside, PRN morphine given with good relief, pre op check list done, pt went down to OR, safety maintained.

## 2023-03-29 NOTE — Progress Notes (Signed)
Respiratory Therapy COPD Screening     Reason for admitting Right Knee tendon rupture    Last admitted: 03/11/23    GBT:DVVO David    Patient on home O2 no IF YES DME:    Last known PFT: none    COPD Classification:    Last seen by pulmonary:                                        [place] PULMONOLOGIST:NONE    OSA ON PAP: no     Smoking Hx:yes      Current:no     IF YES Pack Years:     Patient Home Meds:                                    LABA:None                                    LAMA:None                                     SABA:Albuterol                                    HYW:VPXT                                    LABA/LAMA:STIOLTO                                    LABA/LAMA/ICS:NONE                                    LABA/ICS:NONE                                    Biologics: NONE                                    PDE4 inhibitors: None                                       Other:   COPD Assessment Test - CAT       0 1 2 3 4 5     I never cough    x   I cough all the time   I have no phlegm/mucous x      I'm full phlegm/mucous   My chest does not feel tight at all x      My chest fells very tight   When I walk up a hill or a flight of stairs I am not out of breath    x   When I walk up a  hill or a flight of stairs I am completely out of breath   I am not limited to doing any activities at home x      I am completely limited to doing all activities at home   I am confident leaving my home despite my lung condition  x      I am not confident leaving my home at all because of my lung condition    I sleep soundly    x    I do not sleep soundly because of my lung condition    I have lots of energy      x  I have no energy at all     Total CAT score= 11-20 Moderate       Modified [employer] Dyspnea Scale       Patient Grade Grade 2 "I walk slower that other people my own age walking on level ground because of breathlessness or have to stop to catch my breath when  walking by myself on level ground"                                    Stop-Bang             Do you often SNORE loudly (louder than talking)? yes            Do you often feelTIRED, fatigued or sleepy during day? yes            Has anyone OBSERVED you stop breathing during sleep? no            Do you have high blood PRESSURE? yes            BMI more than 35Kg/m2? yes            AGE over 50 years? yes            NECK circumference >40cm? no            GENDER female? no     Stop Bang score 5  OSA- High Risk 5-8      03/29/23  4:49 AM  Avelina Laine, RRT

## 2023-03-29 NOTE — Addendum Note (Signed)
Addendum  created 03/29/23 1336 by Arnoldo Lenis, MD    Clinical Note Signed

## 2023-03-29 NOTE — Narrator Note (Signed)
Report giving to 4w RN

## 2023-03-30 ENCOUNTER — Inpatient Hospital Stay (HOSPITAL_BASED_OUTPATIENT_CLINIC_OR_DEPARTMENT_OTHER): Payer: No Typology Code available for payment source

## 2023-03-30 ENCOUNTER — Encounter (HOSPITAL_BASED_OUTPATIENT_CLINIC_OR_DEPARTMENT_OTHER)
Admission: AD | Disposition: A | Discharge status: Skilled Nursing Facility | Payer: Self-pay | Source: Emergency Department | Attending: Hospitalist

## 2023-03-30 ENCOUNTER — Inpatient Hospital Stay (HOSPITAL_BASED_OUTPATIENT_CLINIC_OR_DEPARTMENT_OTHER): Admit: 2023-03-30 | Payer: Self-pay | Admitting: Orthopaedic Surgery

## 2023-03-30 DIAGNOSIS — Z471 Aftercare following joint replacement surgery: Secondary | ICD-10-CM | POA: Diagnosis not present

## 2023-03-30 DIAGNOSIS — S86811A Strain of other muscle(s) and tendon(s) at lower leg level, right leg, initial encounter: Secondary | ICD-10-CM | POA: Insufficient documentation

## 2023-03-30 DIAGNOSIS — S86811D Strain of other muscle(s) and tendon(s) at lower leg level, right leg, subsequent encounter: Secondary | ICD-10-CM | POA: Diagnosis not present

## 2023-03-30 DIAGNOSIS — Z96651 Presence of right artificial knee joint: Secondary | ICD-10-CM | POA: Diagnosis not present

## 2023-03-30 LAB — BASIC METABOLIC PANEL
ANION GAP: 7 mmol/L — ABNORMAL LOW (ref 10–22)
BUN (UREA NITROGEN): 12 mg/dL (ref 7–18)
CALCIUM: 8.9 mg/dL (ref 8.5–10.5)
CARBON DIOXIDE: 29 mmol/L (ref 21–32)
CHLORIDE: 101 mmol/L (ref 98–107)
CREATININE: 0.8 mg/dL (ref 0.4–1.2)
ESTIMATED GLOMERULAR FILT RATE: >60 mL/min (ref >60)
Glucose Random: 121 mg/dL (ref 74–160)
POTASSIUM: 4.4 mmol/L (ref 3.5–5.1)
SODIUM: 137 mmol/L (ref 136–145)

## 2023-03-30 LAB — CBC WITH PLATELET
ABSOLUTE NRBC COUNT: 0 10*3/uL (ref 0.0–0.0)
HEMATOCRIT: 27.3 % — ABNORMAL LOW (ref 34.1–44.9)
HEMOGLOBIN: 8.4 g/dL — ABNORMAL LOW (ref 11.2–15.7)
MEAN CORP HGB CONC: 30.8 g/dL — ABNORMAL LOW (ref 31.0–37.0)
MEAN CORPUSCULAR HGB: 27.8 pg (ref 26.0–34.0)
MEAN CORPUSCULAR VOL: 90.4 fL (ref 80.0–100.0)
MEAN PLATELET VOLUME: 8.8 fL (ref 8.7–12.5)
NRBC %: 0 % (ref 0.0–0.0)
PLATELET COUNT: 458 10*3/uL — ABNORMAL HIGH (ref 150–400)
RBC DISTRIBUTION WIDTH STD DEV: 47.1 fL — ABNORMAL HIGH (ref 35.1–46.3)
RED BLOOD CELL COUNT: 3.02 M/uL — ABNORMAL LOW (ref 3.90–5.20)
WHITE BLOOD CELL COUNT: 10.3 10*3/uL (ref 4.0–11.0)

## 2023-03-30 LAB — SURGICAL PATH SPECIMEN HARDWARE

## 2023-03-30 SURGERY — REPAIR, TENDON, PATELLAR
Anesthesia: General | Laterality: Right

## 2023-03-30 MED ORDER — BUPRENORPHINE HCL-NALOXONE HCL 8-2 MG SL SUBL
12.0000 mg | SUBLINGUAL_TABLET | Freq: Every day | SUBLINGUAL | Status: DC
Start: 2023-03-31 — End: 2023-04-01
  Administered 2023-03-31 – 2023-04-01 (×2): 1.5 via SUBLINGUAL
  Filled 2023-03-30 (×2): qty 2

## 2023-03-30 MED ORDER — BUPRENORPHINE HCL-NALOXONE HCL 8-2 MG SL SUBL
8.0000 mg | SUBLINGUAL_TABLET | Freq: Every day | SUBLINGUAL | Status: DC
Start: 2023-03-31 — End: 2023-03-30

## 2023-03-30 MED ORDER — MELATONIN 3 MG PO TABS
3.0000 mg | ORAL_TABLET | Freq: Every evening | ORAL | Status: DC | PRN
Start: 2023-03-30 — End: 2023-04-01
  Administered 2023-03-30 – 2023-03-31 (×2): 3 mg via ORAL
  Filled 2023-03-30 (×2): qty 1

## 2023-03-30 MED ORDER — BUPRENORPHINE HCL-NALOXONE HCL 8-2 MG SL SUBL
12.0000 mg | SUBLINGUAL_TABLET | Freq: Every day | SUBLINGUAL | Status: DC
Start: 2023-03-31 — End: 2023-03-30

## 2023-03-30 MED ORDER — BUPRENORPHINE HCL-NALOXONE HCL 8-2 MG SL SUBL
8.0000 mg | SUBLINGUAL_TABLET | Freq: Every day | SUBLINGUAL | Status: DC
Start: 2023-03-31 — End: 2023-04-01
  Administered 2023-03-31 (×2): 1 via SUBLINGUAL
  Filled 2023-03-30 (×2): qty 1

## 2023-03-30 NOTE — Care Coordination (Signed)
Rehab at Home Clinical Trial    Discussed the program with patient and her daughter, Misty Stanley.   They do not feel the Home program would be appropriate to consider at this time, and prefer to pursue rehab in a skilled facility.     Diona Browner, MD

## 2023-03-30 NOTE — Progress Notes (Signed)
PROGRESS NOTE  03/30/2023    PATIENT INFO: Cheryl Dixon, English-SPEAKING 67 year old female  DATE OF ADMISSION: 03/29/23    ROOM/BED LOCATION:  430/430-A  HOSPITAL DAY: 1    REVIEW OF OVERNIGHT EVENTS:  No acute events     SUBJECTIVE:  Slept well last night, was not in pain until this morning but felt relief after receiving dilaudid.     OBJECTIVE:    VITAL SIGNS      03/30/23  0546 03/30/23  0615 03/30/23  0712 03/30/23  0817   BP: 128/75   135/80   Pulse: 74  74 76   Resp: 18 20  13    Temp: 98.7 F (37.1 C)   97.6 F (36.4 C)   TempSrc: Oral      SpO2: 94%   94%   Weight:           INS/OUTS (PAST 24 HOURS)  No intake/output data recorded.    PHYSICAL EXAM:  Gen: NAD.  Well appearing.    CV: RRR. Normal S1, S2. No m/r/g.  Lungs: Normal work of breathing. CTAB.  Back: No CVA tenderness.  Abd: NABS. Soft, NTND.   Ext: Warm. Left - No edema. Right Leg: significant swelling on R knee and lower leg, bandage on R knee in tact, no breakthrough bleeding.   Neuro: A&Ox4.  Skin: No rashes.  T/L/D:   T/L/D:   Peripheral IV 03/28/23 Anterior;Distal;Left Upper arm (Active)   Number of days: 0       Supraglottic Airway LMA 4 (Active)   Secured Location Center 03/29/23 0954   Secured by Caron Presume tape 03/29/23 0954   Number of days: 0       RECENT LABS:  Chemistries:  Recent Labs     03/28/23  2359 03/30/23  0732   NA 136 137   K 4.2 4.4   CO2 26 29   BUN 19* 12   CREAT 0.9 0.8   CA 8.8 8.9   ANION 10 7*   GFR > 60 > 60    GI:  No results for input(s): "AST", "ALT", "TBILI", "DBILI", "IBIL", "ALKPHOS", "GGTP", "ALBUMIN", "LIP" in the last 72 hours.   CBC:  Recent Labs     03/28/23  2359 03/29/23  2058 03/30/23  0732   WBC 7.9 8.9 10.3   HGB 9.8* 8.3* 8.4*   HCT 32.0* 27.1* 27.3*   PLTA 451* 417* 458*    Coags:   No results for input(s): "INR", "PT", "APTT", "HPTT", "FIB" in the last 72 hours.   Trops, BNP, D-dimer, Lactate, C-RP:  No results for input(s): "TROPTHS", "TROPTHSDELTA", "PROBNP", "DDPEDVT", "DD", "LACTICACID", "CRP" in  the last 72 hours.    Finger Sticks:  No results for input(s): "FINGERSTICKR" in the last 72 hours. Urinalysis:  No results for input(s): "UACOL", "UACLA", "UAGLU", "UABIL", "UAKET", "SPEGRAVURINE", "UAOCC", "UAPH", "UAPRO", "UABAC", "UARBC", "UAWBC", "UANIT", "LEUKOCYTES", "UAMIC", "UASQE" in the last 72 hours.    Invalid input(s): "UAOBU"       MICROBIOLOGY REVIEW  No new microbiology results to review.    IMAGING AND OTHER STUDIES  No new imaging or other studies to review.  Telemetry notable findings:     MEDICATIONS  See list in EPIC    ASSESSMENT & PLAN   Cheryl Dixon is a 67 year old English-speaking female with COPD, anxiety, aortic aneurysm, CVA on Eliquis, OUD on suboxone, who is s/p right TKA who presents with R knee pain and bleeding after  mechanical fall and found to have R knee infrapatellar rupture and hemorrhage.        #Right knee injury  #R Knee pattellar rupture, s/p repair  #R Knee Hemarthrosis  Patient 19 days post TKA presents to the ED after a fall and reported injury to her right knee.    Patient endorses severe pain right knee and reported bleeding from anterior area.   On assessment significant swelling on R knee and lower leg, pulses preserved.  ON R knee CT hematoma and inferior right patellar disruption noted.  Patient clinical presentation is likely 2/2 to her hx of recent falls.   Pt followed by ortho, s/p surgical Right patellar repair 12/01 morning. D/c cephalexin today per ortho  - f/u w/ortho on results of R knee xray   - dilaudid every 3 hours PRN  - Tylenol 1000g every 8 hours    #Falls   #Status post TKA - right knee  # OA, bilateral Knee    #Osteoporosis  Pt w/ hx of OA, Pt S/P TKA right knee  reported 2 recent falls, stated that her right leg given out on her. On vitamin D3 and Risedronate 150 mg monthly. Eliquis 2.5 mg BID.   Denies vision changes hearing impairment dizziness chest pain.   -PT consult     #Normocytic Anemia  Hb 9.8  from 11.1 and   Htc 32 from 38. ( From  03/12/23).   One point drop in Hb noted and that might be in context of pt 's recent bleeding episode.  - continue monitoring CBC     Chronic:  #Aortic Aneurism (ascending aorta), 4.5 cm on CT 11/19/2022.  Patient w/ hx of aortic aneurysm, asymptomatic at this time.     #CVA on 11/2021 (Thrombose of cerebral artery, on Eliquis)   - cont Eliquis 2.5 mg BID  -Atorvastatin 80 mg daily     #COPD:   Pt w/ hx Copd. Asymptomatic at this time.   Continue home medication  -Stiolto Respimat 2.5 - 2.5  -Albuterol inhaler   -DuoNeb as needed     #OUD on suboxone  Hx of Opioid use disorder, in remission.   - Continue home Suboxone.     #MDD  -Wellbutrin 200 p.o. twice daily  - buspar 5mg  BID     #Social/Family     - Lives alone      #Transitional issues:     - ortho out patient follow up      # Tele: No  # Foley: No  # FEN    # DVT ppx: Eliquis   # Pain: dilaudid                Tylenol   # Bowel regimen: Miralax and senna  # Language: English   # Dispo: Ward for right Knee surgery.  Medication Reconciliation: Done    Code Status: Full Code  Health care proxy: Cheryl, Dixon  [phone]      Discussed with attending, Cheryl Royals, MD    Cheryl Leigh, MD   PGY-1   03/30/2023

## 2023-03-30 NOTE — Initial Assessments (Signed)
Inpatient Physical Therapy Initial Evaluation    S: Pt awake and agreeable to PT. "I lost my cane here on 6th fl since last admission."    O: PT consult received, medical chart reviewed. PT initial evaluation completed today.  Please see Vital Sign and Pain Assessment flowsheets for vitals and pain documentation.  Plan of care has been reviewed and updated as necessary.    RN cleared pt for PT and premedicated pt prior to session.     No c/o dizziness/lightheadedness.     03/30/23 1137   Language Information   Language Needs Met By: Lenox Ponds Used Effectively By Patient   Evaluation Type   Evaluation Type Initial Evaluation   Rehab Discipline   Rehab Discipline PT   Safety Devices   Type of Devices Call bell in place;Bed alarm in place   Weight Bearing Status   RLE WBAT   LLE FWB   RUE FWB   LUE FWB   Precautions   Total Knee Replacement Precaution Knee immobilizer  (all times)   Premorbid Mobility   Transfers Independent   Walking Independent;Used assistive device   Walking assistive devices used Rolling walker   ADL / IADL Baseline Status   ADL/IADL Baseline Status Yes   ADL's/IADL's   Dressing Independent   Bathing Independent   Toileting Independent   Premorbid Sensory   Hearing Normal   Sensory Vision Normal   Premorbid Cognition   Cognition Intact   Premorbid Communication   Communication Normal   Living Situation   Living Field seismologist assessible apartment   Lives With Alone   Exterior Home Access No steps   Interior Home Access No steps   Strengths   Strengths Premorbid level of function;Home design;Adaptive/Assistive products;Ability to acquire knowledge   Barriers   Barriers Comorbidities   Cognition   Attention [facility]   Orientation Level Grossly intact   RUE Assessment   RUE Assessment WFL   LUE Assessment   LUE Assessment WFL   RLE Assessment   RLE Assessment X  (s/p R TKA on 11/13; s/p patella tendon repair on 12/01)   LLE Assessment   LLE Assessment WFL   Mobility / Balance   Mobility / Balance Yes   Wt  Bearing Compliance Yes   Bed Mobility   Supine to Sit Min assist to left 1   Sit to Supine Min assist to left 1   Transfers   Transfer Yes   Transfer 1   Transfer From 1 Bed   Transfer Type 1 To and from   Transfer to 1 Stand   Technique 1 Sit to stand;Stand to sit   Transfer Device 1 Rolling walker (Front wheel walker)   Transfer Level of Assistance 1 Contact guard   Trials/Comments 1 1   Gait   Gait Yes   Gait 1   Assistive Device 1 Rolling walker (Front wheel walker)   Pattern 1   (hopping)   Gait Assistance 1 Contact guard   Distance (Ft) 1 3 Feet   Activity Tolerance   Activity Tolerance Endurance does not limit participation   Balance   Sitting - Static Sits without support for > 30 sec;Feet unsupported   Sitting - Dynamic No upper extremity supported;Feet unsupported;Moves/returns truncal midpoint 1-2 inches in multiple planes   Standing - Static No upper extremity supported;Able to maintain 60 sec   Standing - Dynamic Forward lean   Posture Rounded shoulders   Ther Exercise   Therapeutic Exercise? No  Coordination   Gross Motor Performance Observation Stiff   Plan   Prognosis Good   PT Frequency 6x/wk   Recommendation   Recommendation Short-term skilled PT   Equipment Recommended Rolling walker (Front wheel walker)       Precautions:  KI in all times and no knee flexion, WBAT to RLE      A: Pt is a 67 year old female with PMH including COPD, anxiety, aortic aneurysm, CVA on Eliquis, OUD on suboxone, HTN, pre-DM who is s/p right TKA admitted for s/p a mechanical fall at home and found to have R knee infrapatellar rupture and hemorrhage (per chart). Pt had a R TKA on 11/13 and d/c home w/ RW. Pt had R knee patella tendon repair and polyethylene liner exchange on 12/01. Pt instructed in role of physical therapy and goals of evaluation. Pt lives alone in an elevator accessible apt. Pt ambulating w/ RW s/p her R TKA and independent w/ all ADLs.     Pt alert and cooperative. KI in all times and tighten OOB. Pt  demonstrating bed mobility w/ minAx1 to RLE. Pt performing sit <> stand from long sit --> stand; then presenting w/ good balance in standing w/o UE support. Pt endorsing unable to put wt on RLE d/t pain, and only ambulating w/ RW via hopping and R foot off ground. Pt tolerated session well and will continue to benefit from acute skilled PT during her admission to improve functional mobility and activity tolerance.      PT Short Term Goals:  Patient will perform bed mobility independently in 5 treatments.  Patient will perform functional transfers with supervision in 5 treatments.  Patient will ambulate 30 feet with close supervision with rolling walker (front wheel walker) in 5 treatments.    PT Long Term Goals:  Patient will perform functional transfers with independence in 8 treatments.  Patient will ambulate 50 feet with independence with rolling walker (front wheel walker) in 8 treatments.    P: PT 6 times a week while inpatient for transfer training, gait training, and therapeutic exercise as tolerated. Recommend STR upon d/c.    Linde Gillis, PT/s
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
## 2023-03-30 NOTE — Plan of Care (Signed)
Pt, 67 yo Female, is Aox4 in bed. Pt VSS afebrile postsurgery. Patient denies any CP, SOB. Patient is on RA with oxy sat 94-95%. Neurovascular checks complete Q4 hrs - CSM all positive to right lower extremity. Pt has a Pico Vac on right knee, device is on and blinking green. RK dressing is dry, clean and intact. Pt reports pain 7/10 in RK surgical site, managed with PRN medication Hydromorphone 0.6 mg given around 08:18 and ice packs. Upon reassessment pt stated pain was 2-3/10.  Pt given 0.4 mg of Hydromorphone at around 11:35 for pain 5/10 prior to Physical Therapy. Upon reassessment pt stated pain was still 5/10. Pt given 0.6 mg of Hydromorphone around 14:35 for pain 7/10. Upon reassessment pt stated pain was 2/10. Pt given 0.6 mg of Hydromorphone around 17:45 for pain 7/10. Upon reassessment pt stated pain was still 7/10. Pt uses bedpan frequently, was bladder scanned at 16:00, initial scan 471 mL, pt assisted onto commode, PVR 159 mL. Pt has a poor appetite. Safety maintained, bed in lowest position, call light within reach. Continue with plan of care.    Problem: Safety  Goal: Free from accidental physical injury  Outcome: Progressing  Goal: Free from intentional harm  Outcome: Progressing     Problem: Daily Care  Goal: Daily care needs are met  Description: Assess and monitor ability to perform self care and identify potential discharge needs.  Outcome: Progressing     Problem: Pain  Goal: Patient's pain/discomfort is manageable  Description: Assess and monitor patient's pain using appropriate pain scale. Collaborate with interdisciplinary team and initiate plan and interventions as ordered. Re-assess patient's pain level 30 - 60 minutes after pain management intervention.   Outcome: Progressing     Problem: Compromised Skin Integrity  Description: Use this problem when pressure ulcers are classified as stage I or II.  Goal: Skin integrity is maintained or improved  Description: Assess and monitor skin  integrity. Identify patients at risk for skin breakdown on admission and per policy. Collaborate with interdisciplinary team and initiate plans and interventions as needed.  Outcome: Progressing     Problem: Knowledge Deficit  Goal: Patient/S.O. demonstrates understanding of disease process, treatment plan, medications, and discharge instructions.  Description: Complete learning assessment and assess knowledge base  Outcome: Progressing     Problem: Discharge Barriers  Goal: Patient's continuum of care needs are met  Description: Collaborate with interdisciplinary team and initiate plans and interventions as needed.   Outcome: Progressing

## 2023-03-31 DIAGNOSIS — S86811D Strain of other muscle(s) and tendon(s) at lower leg level, right leg, subsequent encounter: Secondary | ICD-10-CM | POA: Diagnosis not present

## 2023-03-31 LAB — CBC WITH PLATELET
ABSOLUTE NRBC COUNT: 0 10*3/uL (ref 0.0–0.0)
ABSOLUTE NRBC COUNT: 0 10*3/uL (ref 0.0–0.0)
HEMATOCRIT: 26.1 % — ABNORMAL LOW (ref 34.1–44.9)
HEMATOCRIT: 27 % — ABNORMAL LOW (ref 34.1–44.9)
HEMOGLOBIN: 8 g/dL — ABNORMAL LOW (ref 11.2–15.7)
HEMOGLOBIN: 8.4 g/dL — ABNORMAL LOW (ref 11.2–15.7)
MEAN CORP HGB CONC: 30.7 g/dL — ABNORMAL LOW (ref 31.0–37.0)
MEAN CORP HGB CONC: 31.1 g/dL (ref 31.0–37.0)
MEAN CORPUSCULAR HGB: 27.7 pg (ref 26.0–34.0)
MEAN CORPUSCULAR HGB: 27.9 pg (ref 26.0–34.0)
MEAN CORPUSCULAR VOL: 90.3 fL (ref 80.0–100.0)
MEAN CORPUSCULAR VOL: 89.7 fL (ref 80.0–100.0)
MEAN PLATELET VOLUME: 9 fL (ref 8.7–12.5)
MEAN PLATELET VOLUME: 9.3 fL (ref 8.7–12.5)
NRBC %: 0 % (ref 0.0–0.0)
NRBC %: 0 % (ref 0.0–0.0)
PLATELET COUNT: 431 10*3/uL — ABNORMAL HIGH (ref 150–400)
PLATELET COUNT: 444 10*3/uL — ABNORMAL HIGH (ref 150–400)
RBC DISTRIBUTION WIDTH STD DEV: 47.6 fL — ABNORMAL HIGH (ref 35.1–46.3)
RBC DISTRIBUTION WIDTH STD DEV: 46.5 fL — ABNORMAL HIGH (ref 35.1–46.3)
RED BLOOD CELL COUNT: 2.89 M/uL — ABNORMAL LOW (ref 3.90–5.20)
RED BLOOD CELL COUNT: 3.01 M/uL — ABNORMAL LOW (ref 3.90–5.20)
WHITE BLOOD CELL COUNT: 10.2 10*3/uL (ref 4.0–11.0)
WHITE BLOOD CELL COUNT: 11.7 10*3/uL — ABNORMAL HIGH (ref 4.0–11.0)

## 2023-03-31 LAB — HOLD RED TOP TUBE

## 2023-03-31 MED ORDER — HYDROMORPHONE HCL 2 MG PO TABS
2.0000 mg | ORAL_TABLET | ORAL | Status: DC | PRN
Start: 2023-03-31 — End: 2023-04-01
  Administered 2023-03-31 – 2023-04-01 (×3): 2 mg via ORAL
  Filled 2023-03-31 (×3): qty 1

## 2023-03-31 NOTE — Plan of Care (Signed)
Pt is 67 yo female, Aox4 in bed. Pt denies any SOB or CP; on RA with oxy sat 92-93%. Pt has an immobilizer and Pico Vac on right knee (RK), device is on and blinking green, dressing C/D/I. Neurovascular checks completed Q8hrs - CSM all positive to RLE. Pt reports pain at 07:29 in right knee surgical site 7/10, pain managed with IV 0.6 mg hydromorphone. Upon reassessment pt stated pain was 2/10. Pt reports pain again at 11:43 in RK 7/10, pain managed with PO 2 mg hydromorphone. Upon reassessment pt stated pain was 2/10. Pt reports pain at 17:59 in RK 6/10, pain managed with PO 2 mg hydromorphone. Upon reassessment pt stated pain was 0/10. Pt uses bedpan and commode with 1 assist. PT was bladder scanned at 16:00 with PVR 417 mL and again at 16:43 with PVR 58 mL. Safety maintained, bed in lowest position and alarm on, call light within reach. Continue with plan of care.    Problem: Safety  Goal: Free from accidental physical injury  Outcome: Progressing  Goal: Free from intentional harm  Outcome: Progressing     Problem: Daily Care  Goal: Daily care needs are met  Description: Assess and monitor ability to perform self care and identify potential discharge needs.  Outcome: Progressing     Problem: Pain  Goal: Patient's pain/discomfort is manageable  Description: Assess and monitor patient's pain using appropriate pain scale. Collaborate with interdisciplinary team and initiate plan and interventions as ordered. Re-assess patient's pain level 30 - 60 minutes after pain management intervention.   Outcome: Progressing     Problem: Compromised Skin Integrity  Description: Use this problem when pressure ulcers are classified as stage I or II.  Goal: Skin integrity is maintained or improved  Description: Assess and monitor skin integrity. Identify patients at risk for skin breakdown on admission and per policy. Collaborate with interdisciplinary team and initiate plans and interventions as needed.  Outcome: Progressing      Problem: Knowledge Deficit  Goal: Patient/S.O. demonstrates understanding of disease process, treatment plan, medications, and discharge instructions.  Description: Complete learning assessment and assess knowledge base  Outcome: Progressing     Problem: Discharge Barriers  Goal: Patient's continuum of care needs are met  Description: Collaborate with interdisciplinary team and initiate plans and interventions as needed.   Outcome: Progressing

## 2023-03-31 NOTE — Plan of Care (Signed)
Patinet A&Ox4, denies any SOB, N&V, chest pain.   Pt endorses pain in her right knee, gave acetaminophen. Good results.   Meds were given as ordered.   Post void bladder scan was done 69 ml.   Patient's been able to use the commode, needs 1 assist.   Melatonin was given per patient request.   Neurovascular checks done. + CSM in RLE    Bed alarm on, bed locked and in lowest position. Call light within reach, bed ide able within reach.   Safety maintained.       Problem: Safety  Goal: Free from accidental physical injury  Outcome: Progressing  Goal: Free from intentional harm  Outcome: Progressing     Problem: Daily Care  Goal: Daily care needs are met  Description: Assess and monitor ability to perform self care and identify potential discharge needs.  Outcome: Progressing     Problem: Pain  Goal: Patient's pain/discomfort is manageable  Description: Assess and monitor patient's pain using appropriate pain scale. Collaborate with interdisciplinary team and initiate plan and interventions as ordered. Re-assess patient's pain level 30 - 60 minutes after pain management intervention.   Outcome: Progressing     Problem: Compromised Skin Integrity  Description: Use this problem when pressure ulcers are classified as stage I or II.  Goal: Skin integrity is maintained or improved  Description: Assess and monitor skin integrity. Identify patients at risk for skin breakdown on admission and per policy. Collaborate with interdisciplinary team and initiate plans and interventions as needed.  Outcome: Progressing     Problem: Knowledge Deficit  Goal: Patient/S.O. demonstrates understanding of disease process, treatment plan, medications, and discharge instructions.  Description: Complete learning assessment and assess knowledge base  Outcome: Progressing     Problem: Discharge Barriers  Goal: Patient's continuum of care needs are met  Description: Collaborate with interdisciplinary team and initiate plans and interventions as  needed.   Outcome: Progressing

## 2023-03-31 NOTE — Progress Notes (Signed)
PROGRESS NOTE  03/31/2023    PATIENT INFO: Cheryl Dixon, English-SPEAKING 67 year old female  DATE OF ADMISSION: 03/29/23    ROOM/BED LOCATION:  430/430-A  HOSPITAL DAY: 2    REVIEW OF OVERNIGHT EVENTS:  No acute events     SUBJECTIVE:  Feeling okay this AM. Endorses some stiffness of the right knee but pain is well controlled on current regimen.     OBJECTIVE:    VITAL SIGNS      03/31/23  1416 03/31/23  1419 03/31/23  1500 03/31/23  1600   BP: 152/83      Pulse: 87      Resp: 13      Temp: 99.5 F (37.5 C) 99.3 F (37.4 C)     TempSrc: Oral      SpO2: 93%  95% 97%   Weight:           INS/OUTS (PAST 24 HOURS)  I/O 24 Hrs:  In: -   Out: 75 [Urine:75]    PHYSICAL EXAM:  Gen: NAD.  Well appearing.    CV: RRR. Normal S1, S2. No m/r/g.  Lungs: Normal work of breathing. CTAB.  Back: No CVA tenderness.  Abd: NABS. Soft, NTND.   Ext: Warm. Left - No edema. Right leg appears less swollen today  Neuro: A&Ox4.  Skin: No rashes.  T/L/D:   T/L/D:   Peripheral IV 03/28/23 Anterior;Distal;Left Upper arm (Active)   Number of days: 0       Supraglottic Airway LMA 4 (Active)   Secured Location Center 03/29/23 0954   Secured by Caron Presume tape 03/29/23 0954   Number of days: 0       RECENT LABS:  Chemistries:  Recent Labs     03/28/23  2359 03/30/23  0732   NA 136 137   K 4.2 4.4   CO2 26 29   BUN 19* 12   CREAT 0.9 0.8   CA 8.8 8.9   ANION 10 7*   GFR > 60 > 60    GI:  No results for input(s): "AST", "ALT", "TBILI", "DBILI", "IBIL", "ALKPHOS", "GGTP", "ALBUMIN", "LIP" in the last 72 hours.   CBC:  Recent Labs     03/28/23  2359 03/29/23  2058 03/30/23  0732 03/31/23  0735 03/31/23  1600   WBC 7.9 8.9 10.3 10.2 11.7*   HGB 9.8* 8.3* 8.4* 8.0* 8.4*   HCT 32.0* 27.1* 27.3* 26.1* 27.0*   PLTA 451* 417* 458* 431* 444*    Coags:   No results for input(s): "INR", "PT", "APTT", "HPTT", "FIB" in the last 72 hours.   Trops, BNP, D-dimer, Lactate, C-RP:  No results for input(s): "TROPTHS", "TROPTHSDELTA", "PROBNP", "DDPEDVT", "DD", "LACTICACID",  "CRP" in the last 72 hours.    Finger Sticks:  No results for input(s): "FINGERSTICKR" in the last 72 hours. Urinalysis:  No results for input(s): "UACOL", "UACLA", "UAGLU", "UABIL", "UAKET", "SPEGRAVURINE", "UAOCC", "UAPH", "UAPRO", "UABAC", "UARBC", "UAWBC", "UANIT", "LEUKOCYTES", "UAMIC", "UASQE" in the last 72 hours.    Invalid input(s): "UAOBU"       MICROBIOLOGY REVIEW  No new microbiology results to review.    IMAGING AND OTHER STUDIES  No new imaging or other studies to review.  Telemetry notable findings:     MEDICATIONS  See list in EPIC    ASSESSMENT & PLAN   Cheryl Dixon is a 67 year old English-speaking female with COPD, anxiety, aortic aneurysm, CVA on Eliquis, OUD on suboxone, who is s/p right TKA  who presents with R knee pain and bleeding after mechanical fall and found to have R knee infrapatellar rupture and hemorrhage.        #Right knee injury  #R Knee pattellar rupture, s/p repair  #R Knee Hemarthrosis  Patient 19 days post TKA presents to the ED after a fall and reported injury to her right knee.    Patient endorses severe pain right knee and reported bleeding from anterior area.   On assessment significant swelling on R knee and lower leg, pulses preserved.  ON R knee CT hematoma and inferior right patellar disruption noted.  Patient clinical presentation is likely 2/2 to her hx of recent falls.   Pt followed by ortho, s/p surgical Right patellar repair 12/01 morning.   - dilaudid every 3 hours PRN - switched to PO   - Tylenol 1000g every 8 hours    #Falls   #Status post TKA - right knee  # OA, bilateral Knee    #Osteoporosis  Pt w/ hx of OA, Pt S/P TKA right knee  reported 2 recent falls, stated that her right leg given out on her. On vitamin D3 and Risedronate 150 mg monthly. Eliquis 2.5 mg BID.   Denies vision changes hearing impairment dizziness chest pain.   -PT following     #Normocytic Anemia  Hb 9.8  from 11.1 and   Htc 32 from 38. ( From 03/12/23).   Hb 8.0 today (03/31/2023), will f/u  with PM CBC     Chronic:  #Aortic Aneurism (ascending aorta), 4.5 cm on CT 11/19/2022.  Patient w/ hx of aortic aneurysm, asymptomatic at this time.     #CVA on 11/2021 (Thrombose of cerebral artery, on Eliquis)   - cont Eliquis 2.5 mg BID  -Atorvastatin 80 mg daily     #COPD:   Pt w/ hx Copd. Asymptomatic at this time.   Continue home medication  -Stiolto Respimat 2.5 - 2.5  -Albuterol inhaler   -DuoNeb as needed     #OUD on suboxone  Hx of Opioid use disorder, in remission.   - Continue home Suboxone.     #MDD  -Wellbutrin 200 p.o. twice daily  - buspar 5mg  BID     #Social/Family     - Lives alone      #Transitional issues:     - ortho out patient follow up      # Tele: No  # Foley: No  # FEN    # DVT ppx: Eliquis   # Pain: dilaudid                Tylenol   # Bowel regimen: Miralax and senna  # Language: English   # Dispo: Ward for right Knee surgery.  Medication Reconciliation: Done    Code Status: Full Code  Health care proxy: LOVA, MAILE  [phone]      Discussed with attending, Serafina Royals, MD    Lacretia Leigh, MD   PGY-1   03/31/2023

## 2023-03-31 NOTE — Progress Notes (Signed)
Inpatient Physical Therapy Treatment Note  S: Pt awake and agreeable to PT.   O: Please see Vital Sign and Pain Assessment flowsheets for vitals and pain documentation.  Plan of care has been reviewed and updated as necessary.     03/31/23 1001   Language Information   Language Needs Met By: Lenox Ponds Used Effectively By Patient   Rehab Discipline   Rehab Discipline PT   Weight Bearing Status   RLE WBAT   Precautions   Total Knee Replacement Precaution Knee immobilizer  (at all times)   Mobility / Balance   Mobility / Balance Yes   Wt Bearing Compliance Yes   Bed Mobility   Supine to Sit Contact guard   Sit to Supine Min assist to right 1   Transfers   Transfer Yes   Transfer 1   Transfer From 1 Sit   Transfer Type 1 To and from   Transfer to 1 Stand   Technique 1 Sit to stand;Stand to sit   Transfer Device 1 Rolling walker (Front wheel walker)   Transfer Level of Assistance 1 Contact guard   Trials/Comments 1 2   Gait   Gait Yes   Gait 1   Assistive Device 1 Rolling walker (Front wheel walker)   Pattern 1 R Decreased stance time   Gait Assistance 1 Close supervision   Distance (Ft) 1 10 Feet  (x2)   Activity Tolerance   Activity Tolerance Endurance does not limit participation   Balance   Sitting - Static Sits without support for > 30 sec;Feet unsupported   Sitting - Dynamic No upper extremity supported;Feet unsupported;Moves/returns truncal midpoint 1-2 inches in multiple planes   Standing - Static No upper extremity supported;Able to maintain 60 sec   Standing - Dynamic Forward lean   Posture Rounded shoulders   Ther Exercise   Therapeutic Exercise? No   Coordination   Gross Motor Performance Observation Stiff   Safety Devices   Type of Devices Call bell in place;Bed alarm in place   Plan   Prognosis Good   PT Frequency 6x/wk   Recommendation   Recommendation Short-term skilled PT   Equipment Recommended Rolling walker (Front wheel walker)       A: Pt alert and cooperative. Pt demonstrating supine --> sit w/ CGA,  but pt would stand up right after sitting at EOB w/ bed rail b/c pt unable to tolerate sitting d/t increased pain in RLE; pt prefer standing to sitting and presenting w/ good balance in standing. Pt performing sit <> stand w/ RW and CGA. Pt ambulating w/ RW via step-to pattern; pt able to put some wt through the RLE but still endorsing increased pain in ambulation. Pt requiring minAx1 to RLE to get back in back. Pt has good UE strength and able to boost self up in bed. Pt tolerated tx session well and will continue to benefit from acute skilled PT during her admission to improve functional mobility and activity tolerance.   P: Continue per PT Care Plan. Recommend STR upon d/c.       Linde Gillis, PT/s
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
## 2023-03-31 NOTE — Care Coordination (Signed)
Discussed pt in MDR. Not medically clear, hct downtrending. PT recs STR & pt's amenable. [facility] offered a bed but pt wants her daughter to choose for her. Attempted to call her daughter Misty Stanley @ [phone] several times w/o success & unable to LM.

## 2023-04-01 ENCOUNTER — Ambulatory Visit (SKILLED_NURSING_FACILITY): Payer: No Typology Code available for payment source | Admitting: Nurse Practitioner

## 2023-04-01 ENCOUNTER — Encounter (HOSPITAL_BASED_OUTPATIENT_CLINIC_OR_DEPARTMENT_OTHER): Payer: Self-pay | Admitting: Registered Nurse

## 2023-04-01 DIAGNOSIS — F332 Major depressive disorder, recurrent severe without psychotic features: Secondary | ICD-10-CM

## 2023-04-01 DIAGNOSIS — F1120 Opioid dependence, uncomplicated: Secondary | ICD-10-CM | POA: Diagnosis not present

## 2023-04-01 DIAGNOSIS — Z96651 Presence of right artificial knee joint: Secondary | ICD-10-CM | POA: Diagnosis not present

## 2023-04-01 DIAGNOSIS — J449 Chronic obstructive pulmonary disease, unspecified: Secondary | ICD-10-CM | POA: Diagnosis not present

## 2023-04-01 DIAGNOSIS — N3281 Overactive bladder: Secondary | ICD-10-CM

## 2023-04-01 DIAGNOSIS — I7121 Aneurysm of the ascending aorta, without rupture: Secondary | ICD-10-CM

## 2023-04-01 DIAGNOSIS — S86811D Strain of other muscle(s) and tendon(s) at lower leg level, right leg, subsequent encounter: Secondary | ICD-10-CM

## 2023-04-01 LAB — CBC WITH PLATELET
ABSOLUTE NRBC COUNT: 0 10*3/uL (ref 0.0–0.0)
HEMATOCRIT: 27.5 % — ABNORMAL LOW (ref 34.1–44.9)
HEMOGLOBIN: 8.6 g/dL — ABNORMAL LOW (ref 11.2–15.7)
MEAN CORP HGB CONC: 31.3 g/dL (ref 31.0–37.0)
MEAN CORPUSCULAR HGB: 28 pg (ref 26.0–34.0)
MEAN CORPUSCULAR VOL: 89.6 fL (ref 80.0–100.0)
MEAN PLATELET VOLUME: 9.2 fL (ref 8.7–12.5)
NRBC %: 0 % (ref 0.0–0.0)
PLATELET COUNT: 481 10*3/uL — ABNORMAL HIGH (ref 150–400)
RBC DISTRIBUTION WIDTH STD DEV: 45.8 fL (ref 35.1–46.3)
RED BLOOD CELL COUNT: 3.07 M/uL — ABNORMAL LOW (ref 3.90–5.20)
WHITE BLOOD CELL COUNT: 11.2 10*3/uL — ABNORMAL HIGH (ref 4.0–11.0)

## 2023-04-01 LAB — HOLD RED TOP TUBE

## 2023-04-01 MED ORDER — BUPRENORPHINE HCL-NALOXONE HCL 8-2 MG SL FILM: ORAL_FILM | SUBLINGUAL | 0 refills | Status: DC

## 2023-04-01 MED ORDER — ONDANSETRON 4 MG PO TBDP
4.0000 mg | ORAL_TABLET | Freq: Once | ORAL | Status: DC
Start: 2023-04-01 — End: 2023-04-01
  Filled 2023-04-01 (×2): qty 1

## 2023-04-01 MED ORDER — SENNOSIDES 8.6 MG PO TABS
17.2000 mg | ORAL_TABLET | Freq: Every evening | ORAL | Status: AC
Start: 2023-04-01 — End: 2023-05-01

## 2023-04-01 MED ORDER — HYDROMORPHONE HCL 2 MG PO TABS
2.0000 mg | ORAL_TABLET | ORAL | 0 refills | Status: AC | PRN
Start: 2023-04-01 — End: 2023-04-08

## 2023-04-01 MED ORDER — HYDROMORPHONE HCL 2 MG PO TABS
2.0000 mg | ORAL_TABLET | ORAL | Status: DC | PRN
Start: 2023-04-01 — End: 2023-04-01

## 2023-04-01 MED ORDER — MELATONIN 3 MG PO TABS
3.0000 mg | ORAL_TABLET | Freq: Once | ORAL | Status: AC
Start: 2023-04-01 — End: 2023-04-01
  Administered 2023-04-01: 3 mg via ORAL
  Filled 2023-04-01: qty 1

## 2023-04-01 MED ORDER — HYDROMORPHONE HCL 2 MG PO TABS
3.0000 mg | ORAL_TABLET | ORAL | Status: DC | PRN
Start: 2023-04-01 — End: 2023-04-01
  Administered 2023-04-01 (×2): 3 mg via ORAL
  Filled 2023-04-01 (×2): qty 1

## 2023-04-01 NOTE — Progress Notes (Signed)
Attempted to see pt for PT, but pt reporting 8/10 pain in bed and declining PT at this moment. RN aware pt's pain. PT will f/u as able.    Linde Gillis, PT/s
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
## 2023-04-01 NOTE — Discharge Inst - Reason you came to the hospital (Addendum)
Dear Cheryl Dixon:     You came to the hospital for:   Leg pain    During your hospital stay:  You were found to have a rupture to a tendon in your knee. You underwent surgery with orthopedics and it was repaired. You were found to have a low blood count likely due to bleeding from your injury.     After you leave the hospital, please:  Meds  Apixaban Eliquis - start taking twice a day for 4 weeks after your surgery. END DATE: 04/26/23.   Asprin - stop taking aspirin while on Apixaban.    Diet/activity  Weight bearing as tolerated. Do not flex your right knee.    Follow-up   Please follow up with orthopedics on 12.17/24 at 9:45AM    Go to the emergency room if you have:  Fever, chills, chest pain, trouble breathing, nausea, vomiting, bleeding, or severe symptoms of any kind.    FOR EMERGENCIES CALL 911      On behalf of the inpatient medical team, is a pleasure taking care of you. Thank you for allowing [place] to do so.     Best wishes,  Your [facility] Team

## 2023-04-01 NOTE — RN Shift Note (Signed)
Pt discharged to [facility] and rehab., nurse hand off report given to RN [facility]. Pt alert and oriented x 4, gait unsteady, scheduled tylenol and Prn dilaudid 3 mg given for pain, IV line removed and telemetry d/c. All necessary paper work and pt belongings sent with EMS.

## 2023-04-01 NOTE — Page 1 Discharge - Orders (Signed)
 PATIENT CARE REFERRAL FORM    Patient Demographics:           Name MRN Sex Birthdate Social Security   Cheryl Dixon [id number] Female  01-22-1956  (67 year old) [id number]   Address Home Phone Insurance ID Marital Status Religion   [address]  [place] [address] [phone] Payor: [facility] /  /  /   [id number] Divorced Catholic   PCP Admission Date Discharge Date Attending Provider Code Status   Hildred Laser, MD 03/29/23      04/01/2023   Serafina Royals, MD Full Code     Primary Contacts:            Extended Emergency Contact Information  Primary Emergency Contact: [facility]  Address: [address]           [place], [place] [address] [place]  Home Phone: [phone]  Relation: Daughter    Healthcare ProxyJENNAMARIE, GOINGS  Phone Number: [phone]    Discharge:             Discharge to: Short Term Rehab  Address:    Has discharge transportation arranged?    Transportation at discharge:      Contact number after discharge:    Best time of day to contact:    Nurse/staff has permission to speak with:      Interpreter requested:    Family notified of disposition:         Diagnosis:             Isolation:   Infections:     Patient Active Problem List:     Opioid dependence on agonist therapy (HCC)     Tobacco use disorder     Fibrocystic breast     Essential hypertension     Knee pain     Chronic low back pain     OA (osteoarthritis) of knee     H. pylori infection     Major depressive disorder, recurrent episode, severe (HCC)     Occult blood in stools     Prediabetes     Epigastric pain     Chronic obstructive pulmonary disease (HCC)     Left foot pain     Multiple polyps of colon     Acquired deformity of toe, right     Dislocation of metatarsophalangeal joint of lesser toe, right, sequela     Postmenopausal atrophic vaginitis     Abnormal loss of weight     Aneurysm of ascending aorta without rupture (HCC)     COVID-19 virus infection     BMI 32.0-32.9,adult     Chronic dysuria     LLQ  pain     Osteoporosis     History of endometriosis     Lung cancer screening     Constipation, drug induced     Weakness     Cerebrovascular accident (CVA) (HCC)     Overactive bladder      [facility] [phone] Option #4     Substance addiction (HCC)     Anxiety     Obese     S/P total knee arthroplasty, right     Patellar tendon rupture, right, initial encounter     Status post total right knee replacement     Rupture patellar tendon, right, subsequent encounter      Review of Patient's Allergies indicates:   Darvon  Meperidine hcl              Comment:Nausea/vomit   Paxil [paroxetine]      Rash   Zoloft [sertraline *    Rash   Hospital Administered Immunizations Administered for This Admission  Never Reviewed      No immunizations on file.          Physician Order's: (Include specific orders for Diet, Labs, or Tests)       Page 1 Instructions    Nursing:   Assess and monitor mental status and vitals: heart rate, blood pressure, temperature, respiratory rate, and oxygen saturation  Please ensure patient is taking medications as prescribed.  Particularly, please ensure adherence with the following medications:   Apixaban 2.5mg  BID for 4 weeks       Wound - PICO dsg to remain in place until post op f/u appt unless >70% saturated. Currently stable blood drainage. Okay to leave in place.   Pain - multimodal pain regimen, use ice as tolerated, elevate R ankle.   Activity (WB) - RLE WBAT w KI ON AT ALL TIMES, NO R KNEE FLEXION, rolling walker  DVT PPx - defer to medicine, appears to be receiving Eliquis 2.5 Mg twice daily, we recommend at least 4 weeks of DVT PPx following surgery, END DATE: 04/26/23. SCDs.   Prophylaxis - Recommend use of Incentive spirometry   Bowel regimen - passing gas, encourage fluid intake, bowel meds on board      Nutrition:   Regular diet     Physical/Occupational Therapy:   Patient does need physical therapy for right knee weakness  post-op.    Transition of Care  Please ensure patient has primary care follow up.  Please ensure patient attends his other upcoming appointments.    Future Appointments   Date Time Provider Department Center   04/07/2023 10:30 AM Milta Deiters, PhD [facility]   04/07/2023 10:30 AM Azzie Glatter, MD [facility]   04/14/2023  9:45 AM Bo Merino Ignatius Specking, MD [facility]   04/21/2023 10:30 AM Boneta Lucks, RN [facility]   04/24/2023  9:15 AM Gabriel Rainwater, MD [facility]                        Insulin Instructions  No orders of the defined types were placed in this encounter.        Current Discharge Medication List    START taking these medications    HYDROmorphone (DILAUDID) 2 MG tablet  Take 1 tablet by mouth every 3 (three) hours as needed (6-10) Max Daily Amount: 16 mg  for up to 5 days  Comments: Partial fill upon patient's request.    sennosides (SENOKOT) 8.6 MG tablet  Take 2 tablets by mouth nightly      CONTINUE these medications which have NOT CHANGED    atorvastatin (LIPITOR) 80 MG tablet  Take 1 tablet by mouth Daily after dinner  Qty: 90 tablet Refills: 3    polyethylene glycol (GLYCOLAX/MIRALAX) 17 g packet  Take 1 packet by mouth in the morning.  Qty: 90 packet Refills: 3  Comments: Do not use healthylax which causes diarrhea.  Miralax and other generics are OK  Associated Diagnoses:Drug-induced constipation    estrogen, conjugated, (PREMARIN) 0.625 MG/GM vaginal cream  Place vaginally daily    busPIRone (BUSPAR) 5 MG tablet  Take 5 mg by mouth in the morning and 5 mg before bedtime.    celecoxib (CELEBREX)  200 MG capsule  Take 1 capsule by mouth in the morning and 1 capsule before bedtime. Do all this for 14 days.  Qty: 28 capsule Refills: 0    acetaminophen (TYLENOL) 500 MG tablet  Take 2 tablets by mouth every 8 (eight) hours as needed for Pain  Qty: 60 tablet Refills: 0    solifenacin (VESICARE) 10 MG tablet  TAKE 1 TABLET BY MOUTH EVERY DAY IN THE MORNING  Qty: 90 tablet Refills:  0  Associated Diagnoses:Overactive bladder    hydrocortisone 2.5 % cream  Apply topically 2 (two) times daily To affected areas of scalp and ears  Qty: 56 g Refills: 1    estradiol (ESTRACE) 0.1 MG/GM vaginal cream  PLACE 5 G VAGINALLY 3 TIMES A WEEK MYLAN BRAND ONLY.  Qty: 42.5 g Refills: 0  Associated Diagnoses:Postmenopausal atrophic vaginitis    famotidine (PEPCID) 20 MG tablet  TAKE 1 TABLET BY MOUTH IN THE MORNING AND BEFORE BEDTIME  Qty: 180 tablet Refills: 0  Associated Diagnoses:Epigastric pain    apixaban (ELIQUIS) 2.5 MG po tablet  Take 1 tablet by mouth in the morning and 1 tablet before bedtime.  Qty: 60 tablet Refills: 1    pantoprazole (PROTONIX) 40 MG tablet  Take 1 tablet by mouth daily  Qty: 30 tablet Refills: 0    buprenorphine-naloxone (SUBOXONE) 8-2 MG sublingual film  Take 2.5 films under the tongue daily.  Qty: 70 Film Refills: 0  Comments: Partial fill upon patient request.  Associated Diagnoses:Opioid dependence on agonist therapy (HCC)    tiotropium-olodaterol (STIOLTO RESPIMAT) 2.5-2.5 MCG/ACT inhal  Inhale 2 puffs into the lungs daily    buPROPion (WELLBUTRIN SR) 200 MG 12 hr tablet  TAKE 1 TABLET BY MOUTH IN THE MORNING AND BEFORE BEDTIME  Qty: 180 tablet Refills: 3    cetirizine (ZYRTEC) 10 MG tablet  TAKE 1 TABLET BY MOUTH EVERY DAY IN THE MORNING  Qty: 90 tablet Refills: 3  Associated Diagnoses:Acute cough; COPD with acute exacerbation (HCC)    VITAMIN D3 50 MCG (2000 UT) TABS tablet  TAKE 1 TABLET BY MOUTH EVERY DAY IN THE MORNING  Qty: 90 tablet Refills: 3      STOP taking these medications    aspirin 81 MG chewable tablet  Comments:  Reason for Stopping:    oxyCODONE (ROXICODONE) 5 MG immediate release tablet  Comments:  Reason for Stopping:    risedronate (ACTONEL) 150 MG tablet  Comments:  Reason for Stopping:          Palliative Care Referral  No orders of the defined types were placed in this encounter.    Hospice Referral  No orders of the defined types were placed in this  encounter.    Discharge Supplies:   No discharge procedures on file.      Follow-up Information    None       Current and Future Appointments at [facility] [phone] Days) 04/01/2023 - 06/30/2023      The maximum numb

## 2023-04-01 NOTE — Plan of Care (Signed)
Pt alert and oriented x 4, calm/co-operative, afebrile and at RA, SPO2@ 95-98%. Denies CP, SOB, dizziness and N/V. Endorse pain at RLE, by got PRN dilaudid and scheduled pain meds with good effect. Cold pack applied. Continent x 2, OOB with walker with 1 min assist to commode. Q8 Neurovascular check done, +ve CSM.   Call light at pt reach. Bed locked at low position and safety maintained.      Problem: Safety  Goal: Free from accidental physical injury  Outcome: Progressing  Goal: Free from intentional harm  Outcome: Progressing     Problem: Daily Care  Goal: Daily care needs are met  Description: Assess and monitor ability to perform self care and identify potential discharge needs.  Outcome: Progressing     Problem: Pain  Goal: Patient's pain/discomfort is manageable  Description: Assess and monitor patient's pain using appropriate pain scale. Collaborate with interdisciplinary team and initiate plan and interventions as ordered. Re-assess patient's pain level 30 - 60 minutes after pain management intervention.   Outcome: Progressing     Problem: Compromised Skin Integrity  Description: Use this problem when pressure ulcers are classified as stage I or II.  Goal: Skin integrity is maintained or improved  Description: Assess and monitor skin integrity. Identify patients at risk for skin breakdown on admission and per policy. Collaborate with interdisciplinary team and initiate plans and interventions as needed.  Outcome: Progressing     Problem: Knowledge Deficit  Goal: Patient/S.O. demonstrates understanding of disease process, treatment plan, medications, and discharge instructions.  Description: Complete learning assessment and assess knowledge base  Outcome: Progressing     Problem: Discharge Barriers  Goal: Patient's continuum of care needs are met  Description: Collaborate with interdisciplinary team and initiate plans and interventions as needed.   Outcome: Progressing

## 2023-04-01 NOTE — Plan of Care (Signed)
Pt is A&Ox4, calm and cooperative. Denies any SOB, chest pain, N&V.   Endorses pain in her R knee, acetaminophen given. Good results.   Meds given as ordered.   Pt's uses commode with 1-2 assist.   Continues O2 monitoring 93-95%, pt is on RA.   Right foot elevated. Neurovascular checks were done , + CSM.   Able to makes needs known.   Falls precautions maintained.     Bed alarm on, bed locked and in lowest position. Call light within reach. Bed side table within reach.   Safety maintained. Will continue plan of care.         Problem: Safety  Goal: Free from accidental physical injury  Outcome: Progressing  Goal: Free from intentional harm  Outcome: Progressing     Problem: Daily Care  Goal: Daily care needs are met  Description: Assess and monitor ability to perform self care and identify potential discharge needs.  Outcome: Progressing     Problem: Pain  Goal: Patient's pain/discomfort is manageable  Description: Assess and monitor patient's pain using appropriate pain scale. Collaborate with interdisciplinary team and initiate plan and interventions as ordered. Re-assess patient's pain level 30 - 60 minutes after pain management intervention.   Outcome: Progressing     Problem: Compromised Skin Integrity  Description: Use this problem when pressure ulcers are classified as stage I or II.  Goal: Skin integrity is maintained or improved  Description: Assess and monitor skin integrity. Identify patients at risk for skin breakdown on admission and per policy. Collaborate with interdisciplinary team and initiate plans and interventions as needed.  Outcome: Progressing     Problem: Knowledge Deficit  Goal: Patient/S.O. demonstrates understanding of disease process, treatment plan, medications, and discharge instructions.  Description: Complete learning assessment and assess knowledge base  Outcome: Progressing     Problem: Discharge Barriers  Goal: Patient's continuum of care needs are met  Description: Collaborate with  interdisciplinary team and initiate plans and interventions as needed.   Outcome: Progressing

## 2023-04-01 NOTE — Discharge Summary (Signed)
 Physician Discharge Summary     Patient ID:  Name Cheryl Dixon  MRN [id number]   Age 67 year old  DOB [birth year]    Code Status at Admission: Full Code  Code Status at Discharge: Full Code   Healthcare Proxy and Phone Number: Cheryl Dixon [phone]    Admit Date: 03/29/23    Discharge Date: 04/01/23    Admitting Physician: Aida Puffer, MD  Discharge Physicians   Intern: Lacretia Leigh, MD   Resident: Aviva Signs, MD   Attending: Serafina Royals, MD     Discharge Diagnoses: Right knee pattellar rupture, normocytic anemia     Issues for Follow up:   [ ]  repeat CBC in 1 week (around 04/08/2023) to monitor Hemoglobin  [ ]  outpatient ortho 04/14/2023    Brief Hospital Course:   Patient was admitted for repair of R patellar tendon rupture. She was also found to have normocytic anemia.      Relevant Past Medical History:   Past Medical History:  No date: Anxiety  No date: Arthritis  No date: COPD (chronic obstructive pulmonary disease) (HCC)  No date: Depression  No date: Obese  No date: Substance addiction (HCC)  No date: Varicose veins of lower extremities    Summary of the Presenting Complaint: (From H&P)  Cheryl Dixon is a 67 year old English-speaking female with PMH COPD, anxiety, aortic aneurysm, CVA on Eliquis, OUD on suboxone, who is 17 days s/p right TKA and 2 days s/p right patellar tendon rupture, to the ED by ambulance after a fall at home.   Patient states that 4 days ago felt a pop followed by severe pain in her right knee while walking, then called doctor's office, was assessed and told that she have R patellar tendon rupture and would be scheduled for surgery.   Patient reports that 3 days ago felt pain on her right knee after twisting her right leg while sitting down and then, today she states that while walking to the bathroom wearing R knee brace and using a walker, her right leg gave then she felt to the ground and noted significant bleeding from right knee.  Per Patient's notes review, R Knee bleeding  was controlled on scene by EMS and upon ED arrival patient hemodynamically stable, mild oozing of dark blood through dressing was noted.    Hospital Course (by problem):   #Right knee injury  #R Knee pattellar rupture, s/p repair  #R Knee Hemarthrosis  Patient 19 days post TKA presents to the ED after a fall and reported injury to her right knee. Patient endorses severe pain right knee and reported bleeding from anterior area. On assessment significant swelling on R knee and lower leg, pulses preserved. ON R knee CT hematoma and inferior right patellar disruption noted. Patient clinical presentation is likely 2/2 to her hx of recent falls.   Pt followed by ortho:   - s/p open R patellar tendon rupture repair, abx bead placement, extensor tendon reinforcement with cerclage cable, R TKA poly exchange revision. Pain control with dilaudid and tylenol.      #Falls   #Status post TKA - right knee  # OA, bilateral Knee    #Osteoporosis  Pt w/ hx of OA, Pt S/P TKA right knee  reported 2 recent falls, stated that her right leg given out on her. On vitamin D3 and Risedronate 150 mg monthly. Eliquis 2.5 mg BID.      #Normocytic Anemia  Hb 9.8  from 11.1 and  Htc 32 from 38. ( From 03/12/23).   Hb 8.6 at discharge. No interventions needed for this throughout admission.      Chronic:  #Aortic Aneurism (ascending aorta), 4.5 cm on CT 11/19/2022.  Patient w/ hx of aortic aneurysm, asymptomatic at this time.     #CVA on 11/2021 (Thrombose of cerebral artery, on Eliquis)   - cont Eliquis 2.5 mg BID  -Atorvastatin 80 mg daily     #COPD:   Pt w/ hx Copd. Asymptomatic at this time. Continue home medication    #OUD on suboxone  Hx of Opioid use disorder, in remission.   - Continue home Suboxone.     #MDD  -Wellbutrin 200 p.o. twice daily  - buspar 5mg  BID      Discharge Exam:  Gen: NAD.  Well appearing.    CV: RRR. Normal S1, S2. No m/r/g.  Lungs: Normal work of breathing. CTAB.  Back: No CVA tenderness.  Abd: NABS. Soft, NTND.   Ext:  Right foot +1 pitting edema  Neuro: A&Ox4.  Skin: No rashes.  BP 153/76   Pulse 74   Temp 98.6 ?F (37 ?C)   Resp 18   Wt 90.7 kg (200 lb)   LMP 11/20/1991   SpO2 95%   BMI 37.79 kg/m?     Key labs, imaging, other tests:     Chemistries:  Recent Labs     03/30/23  0732   NA 137   K 4.4   CO2 29   BUN 12   CREAT 0.8   CA 8.9   ANION 7*   GFR > 60    GI:  No results for input(s): "AST", "ALT", "TBILI", "DBILI", "IBIL", "ALKPHOS", "GGTP", "ALBUMIN", "LIP" in the last 72 hours.   CBC:  Recent Labs     03/29/23  2058 03/30/23  0732 03/31/23  0735 03/31/23  1600   WBC 8.9 10.3 10.2 11.7*   HGB 8.3* 8.4* 8.0* 8.4*   HCT 27.1* 27.3* 26.1* 27.0*   PLTA 417* 458* 431* 444*    Coags:   No results for input(s): "INR", "PT", "APTT", "HPTT", "FIB" in the last 72 hours.   Trops, BNP, D-dimer, Lactate, C-RP:  No results for input(s): "TROPTHS", "TROPTHSDELTA", "PROBNP", "DDPEDVT", "DD", "LACTICACID", "CRP" in the last 72 hours.    Finger Sticks:  No results for input(s): "FINGERSTICKR" in the last 72 hours. Urinalysis:  No results for input(s): "UACOL", "UACLA", "UAGLU", "UABIL", "UAKET", "SPEGRAVURINE", "UAOCC", "UAPH", "UAPRO", "UABAC", "UARBC", "UAWBC", "UANIT", "LEUKOCYTES", "UAMIC", "UASQE" in the last 72 hours.    Invalid input(s): "UAOBU"       Micro:  Respiratory Panel  Lab Results   Component Value Date    COVID19  12/08/2022     Positive for SARS-CoV-2(2019 novel coronavirus)by PCR  methodology.  This test has been authorized by the FDA under an Emergency  Use Authorization (EUA) for use by authorized laboratories.      COVID19 SARS-CoV-2 (A) 12/08/2022    Lab Results   Component Value Date    FLUA  12/08/2022     Negative for Influenza A by PCR methodology.  This test has been authorized by the FDA under an Emergency  Use Authorization (EUA) for use by authorized laboratories.      FLUB  12/08/2022     Negative for influenza B by PCR methodology.  This test has been authorized by the FDA under an Emergency  Use  Authorization (EUA) for use by authorized laboratories.  RSV  12/08/2022     Negative for Respiratory Syncytial Virus by a PCR method.  This test has been authorized by the FDA under an Emergency  Use Authorization (EUA) for use by authorized laboratories.            Culture Data  Blood Cultures  No results found for: "BLCA"  No results found for: "BLCAN"    Urine Cultures  Lab Results   Component Value Date    UC  12/08/2022     Multiple bacterial morphotypes present. This may represent  specimen contamination.  Suggest appropriate recollection  with timely delivery to the laboratory, if clinically  indicated.      UC COLONY COUNT   <10,000 ORGANISMS/ML   12/08/2022    UC  10/22/2020     Multiple bacterial morphotypes present. This may represent  specimen contamination.  Suggest appropriate recollection  with timely delivery to the laboratory, if clinically  indicated.      UC COLONY COUNT   50,000 - 100,000 ORGANISMS/ML   10/22/2020       Respiratory Cultures  No results found for: "RC" Gram Stain  Lab Results   Component Value Date    GS  03/29/2023     GRAM STAIN   NO POLYMORPHONUCLEAR LEUKOCYTES SEEN   NO ORGANISMS SEEN         Other Cultures:  Lab Results   Component Value Date    WC NO GROWTH 03/29/2023     No results found for: "BFC"     Other Micro  No results found for: "STREPAG"  Lab Results   Component Value Date    MRSASAPCR  02/24/2023     Negative for MRSA by PCR methodology.  Negative for STAPH AUREUS by PCR methodology.       No results found for: "CDIFFTOX"     XR Knee Right 1 or 2 views    Result Date: 03/30/2023  Exam: Right knee, 2 views Indication: s/p R patella tendon rupture repair & R TKA liner exchange Comparison: 03/26/2023 Findings: Bones and Joints: There has been interval patellar tendon repair, with single loop cerclage wire extending from the patella to the tibial tubercle. Interval placement of antibiotic beads extending superiorly from the tibial tubercle. Position of existing  total knee arthroplasty prosthetic components is unchanged. No evidence of acute fracture. Soft Tissues: Soft tissue swelling.     Postoperative changes, as above. Reviewed and Electronically Signed By: Diona Foley,

## 2023-04-01 NOTE — Discharge Inst - Page 1 Instructions (Addendum)
Nursing:   Assess and monitor mental status and vitals: heart rate, blood pressure, temperature, respiratory rate, and oxygen saturation  Please ensure patient is taking medications as prescribed.  Particularly, please ensure adherence with the following medications:   Apixaban 2.5mg  BID for 4 weeks       Wound - PICO dsg to remain in place until post op f/u appt unless >70% saturated. Currently stable blood drainage. Okay to leave in place.   Pain - multimodal pain regimen, use ice as tolerated, elevate R ankle.   Activity (WB) - RLE WBAT w KI ON AT ALL TIMES, NO R KNEE FLEXION, rolling walker  DVT PPx - defer to medicine, appears to be receiving Eliquis 2.5 Mg twice daily, we recommend at least 4 weeks of DVT PPx following surgery, END DATE: 04/26/23. SCDs.   Prophylaxis - Recommend use of Incentive spirometry   Bowel regimen - passing gas, encourage fluid intake, bowel meds on board      Nutrition:   Regular diet     Physical/Occupational Therapy:   Patient does need physical therapy for right knee weakness post-op.    Transition of Care  Please ensure patient has primary care follow up.  Please ensure patient attends his other upcoming appointments.    Future Appointments   Date Time Provider Department Center   04/07/2023 10:30 AM Milta Deiters, PhD [facility]   04/07/2023 10:30 AM Azzie Glatter, MD [facility]   04/14/2023  9:45 AM Bo Merino Ignatius Specking, MD [facility]   04/21/2023 10:30 AM Boneta Lucks, RN [facility]   04/24/2023  9:15 AM Gabriel Rainwater, MD [facility]

## 2023-04-01 NOTE — Care Coordination (Signed)
CASE MANAGER REASSESSMENT NOTE      Case Reviewed with: team during MDR     Level of Care:  Inpatient    High Risk Huddle: High Risk Huddle Not Indicated       Next review date if insurance: daily    Change in status from initial assessment: Team reports patient has been medically cleared for discharge. Patient is amenable to STR and has accepted bed at [facility] - pending insurance auth     New information that will affect transition plan: as above     Anticipated Discharge plan: Pending insurance auth - will be transferred to [facility]. Patient and daughter in   agreement with discharge plan     Per patient choice, referrals made to:  as above

## 2023-04-01 NOTE — Progress Notes (Signed)
 Chart review complete  OUD medication reviewed  BSAS documentation, quarterly, complete and utd

## 2023-04-02 MED ORDER — BUPRENORPHINE HCL-NALOXONE HCL 4-1 MG SL FILM: 1.0000 | ORAL_FILM | Freq: Every day | SUBLINGUAL | 0 refills | Status: DC
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
## 2023-04-06 LAB — WOUND CULT EXT AND GRAM STAIN: WOUND CULTURE EXTENDED: NO GROWTH

## 2023-04-06 LAB — WOUND CULTURE ANAEROBIC

## 2023-04-06 NOTE — Progress Notes (Signed)
 [facility] NURSING HOME PROGRAM - ADMISSION NOTE     Subjective     Cheryl Dixon is a 67 year old female admitted to [facility]  SNF for short term rehab following hospitalization at [facility] (11/30 to 12/4) for R patellar tendon rupture.     HPI: 67 year old woman w PMH COPD, anxiety, aortic aneurysm, hx CVA on eliquis, OUD on suboxone. Pt was 17d s/p R TKA and 2 days PTA found to have R patellar tendon rupture. She was supposed to be scheduled for surgery, but pt fell at home and traumatized her R knee which lead to bleeding and call to 911. The pt was transferred to the ER.    # R knee patella rupture, R knee hemathrosis - s/p open R patellar tendon rupture repair, antibiotics bead placement, extensor tendon reincorcement with cerclage cable, R TKA poly exchange revision on 12/1.    # Anemia post po - Hb 8.6 at discharge, compared to 11.1 at baseline.     PCP: Hildred Laser, MD    ROS:  Pt states that her knee is painful but slowly getting better  Using the walker to the bathroom and back without difficulty    Patient Active Problem List:     Opioid dependence on agonist therapy (HCC)     Tobacco use disorder     Fibrocystic breast     Essential hypertension     Knee pain     Chronic low back pain     OA (osteoarthritis) of knee     H. pylori infection     Major depressive disorder, recurrent episode, severe (HCC)     Occult blood in stools     Prediabetes     Epigastric pain     Chronic obstructive pulmonary disease (HCC)     Left foot pain     Multiple polyps of colon     Acquired deformity of toe, right     Dislocation of metatarsophalangeal joint of lesser toe, right, sequela     Postmenopausal atrophic vaginitis     Abnormal loss of weight     Aneurysm of ascending aorta without rupture (HCC)     COVID-19 virus infection     BMI 32.0-32.9,adult     Chronic dysuria     LLQ pain     Osteoporosis     History of endometriosis     Lung cancer screening     Constipation, drug  induced     Weakness     Cerebrovascular accident (CVA) (HCC)     Overactive bladder      [facility] [phone] Option #4     Substance addiction (HCC)     Anxiety     Obese     S/P total knee arthroplasty, right     Patellar tendon rupture, right, initial encounter     Status post total right knee replacement     Rupture patellar tendon, right, subsequent encounter        Review of Patient's Allergies indicates:   Darvon                     Meperidine hcl              Comment:Nausea/vomit   Paxil [paroxetine]      Rash   Zoloft [sertraline *    Rash       Current Outpatient Medications:     buprenorphine-naloxone (SUBOXONE)  4-1 MG sublingual film, Place 1 Film under the tongue daily Max Daily Amount: 1 Film  for 14 days, Disp: 14 Film, Rfl: 0    sennosides (SENOKOT) 8.6 MG tablet, Take 2 tablets by mouth nightly, Disp: , Rfl:     HYDROmorphone (DILAUDID) 2 MG tablet, Take 1 tablet by mouth every 3 (three) hours as needed (6-10) Max Daily Amount: 16 mg  for up to 7 days, Disp: 50 tablet, Rfl: 0    buprenorphine-naloxone (SUBOXONE) 8-2 MG sublingual film, Take 2.5 films under the tongue daily. (Patient taking differently: 1 Film  in the morning and 1 Film before bedtime. Take 2.5 films under the tongue daily.Marland Kitchen), Disp: 21 Film, Rfl: 0    estrogen, conjugated, (PREMARIN) 0.625 MG/GM vaginal cream, Place vaginally daily, Disp: , Rfl:     busPIRone (BUSPAR) 5 MG tablet, Take 5 mg by mouth in the morning and 5 mg before bedtime., Disp: , Rfl:     solifenacin (VESICARE) 10 MG tablet, TAKE 1 TABLET BY MOUTH EVERY DAY IN THE MORNING, Disp: 90 tablet, Rfl: 0    estradiol (ESTRACE) 0.1 MG/GM vaginal cream, PLACE 5 G VAGINALLY 3 TIMES A WEEK MYLAN BRAND ONLY., Disp: 42.5 g, Rfl: 0    famotidine (PEPCID) 20 MG tablet, TAKE 1 TABLET BY MOUTH IN THE MORNING AND BEFORE BEDTIME, Disp: 180 tablet, Rfl: 0    apixaban (ELIQUIS) 2.5 MG po tablet, Take 1 tablet by mouth in the morning and 1 tablet before  bedtime., Disp: 60 tablet, Rfl: 1    pantoprazole (PROTONIX) 40 MG tablet, Take 1 tablet by mouth daily, Disp: 30 tablet, Rfl: 0    tiotropium-olodaterol (STIOLTO RESPIMAT) 2.5-2.5 MCG/ACT inhal, Inhale 2 puffs into the lungs daily, Disp: , Rfl:     buPROPion (WELLBUTRIN SR) 200 MG 12 hr tablet, TAKE 1 TABLET BY MOUTH IN THE MORNING AND BEFORE BEDTIME, Disp: 180 tablet, Rfl: 3    VITAMIN D3 50 MCG (2000 UT) TABS tablet, TAKE 1 TABLET BY MOUTH EVERY DAY IN THE MORNING, Disp: 90 tablet, Rfl: 3    atorvastatin (LIPITOR) 80 MG tablet, Take 1 tablet by mouth Daily after dinner, Disp: 90 tablet, Rfl: 3    polyethylene glycol (GLYCOLAX/MIRALAX) 17 g packet, Take 1 packet by mouth in the morning., Disp: 90 packet, Rfl: 3    acetaminophen (TYLENOL) 500 MG tablet, Take 2 tablets by mouth every 8 (eight) hours as needed for Pain, Disp: 60 tablet, Rfl: 0    hydrocortisone 2.5 % cream, Apply topically 2 (two) times daily To affected areas of scalp and ears, Disp: 56 g, Rfl: 1    cetirizine (ZYRTEC) 10 MG tablet, TAKE 1 TABLET BY MOUTH EVERY DAY IN THE MORNING, Disp: 90 tablet, Rfl: 3    Active nursing home medication list reviewed (see EMR at nursing home), see any changes made below.     Objective   VS: BP 131/66   Pulse 77   Temp 97.8 ?F (36.6 ?C)   Resp 18   Wt 88.5 kg (195 lb)   LMP 11/20/1991   SpO2 98%   BMI 36.84 kg/m?      Exam:  Sitting on edge of bed  Able to roll up her pants and show me her bandage   There is knee edema and slight warmth surrounding the surgical site  Dried blood seen on dressing about 20% of the dressing  Heart regular  Lungs clear  No edema    Labs:  03/30/2023  Na 137, K 4.4, BUN 12, Cr 0.8  WBC 11.7, Hb 8.4, Plt 444    Studies:  CT RLE 11/29  CT scan of the right knee is degraded by streak artifact and reveals: 1. Intact right TKA with adjacent recent postoperative changes. 2. Patella alta and heterogeneous soft tissue density in the expected location of the infrapatellar tendon. This  appearance may represent infrapatellar tendon rupture and/or adjacent hemorrhage. Preliminary report provided at the time of the study by Dr. Cherly Hensen of [employer]. Reviewed and Electronically Signed By: Toney Reil, MD Signed Date and Time: 03/27/2023 9:33 AM     XR Knee Right 1 or 2 views     Result Date: 03/30/2023  Exam: Right knee, 2 views Indication: s/p R patella tendon rupture repair & R TKA liner exchange Comparison: 03/26/2023 Findings: Bones and Joints: There has been interval patellar tendon repair, with single loop cerclage wire extending from the patella to the tibial tubercle. Interval placement of antibiotic beads extending superiorly from the tibial tubercle. Position of existing total knee arthroplasty prosthetic components is unchanged. No evidence of acute fracture. Soft Tissues: Soft tissue swelling.      Postoperative changes, as above. Reviewed and Electronically Signed By: Diona Foley, MD Signed Date and Time: 03/30/2023 12:37 PM   Assessment and Plan     [phone]) Rupture patellar tendon, right, sequela  (primary encounter diagnosis)  (G25.427) Status post total right knee replacement  (G89.18) Post-op pain  Comment: The patient is bearing weight and participating in PT/OT.  Plan: Continue PT/OT. Continue pain mgt with tylenol, dilaudid PRN. On eliquis for hx CVA. Dressing to be left intact until ortho FU 12/17.    (F11.20) Opioid dependence on agonist therapy (HCC)  Comment: Chronic, stable.  Plan: On suboxone film 8-2, 2.5 films per day    (K59.03) Drug-induced constipation  Comment: Managed  Plan: Cont senna, miralax    (F41.9) Anxiety  (F33.2) Severe episode of recurrent major depressive disorder, without psychotic features (HCC)  Comment: Stable.  Plan: Continue Wellbutrin, buspar    (N32.81) Overactive bladder  Comment: Chronic, stable.  Plan: Continue solifenacin    (I71.21) Aneurysm of ascending aorta without rupture (HCC)  Comment: Chronic, stable  Plan: CTM BP    (J44.9) Chronic  obstructive pulmonary disease, unspecified COPD type (HCC)  Comment: Chronic, stable.  Plan: Continue stiolto    (K21.9) Chronic GERD  Comment: Chronic, stable.  Plan: Continue pantoprazole    (N95.2) Postmenopausal atrophic vaginiti

## 2023-04-07 ENCOUNTER — Ambulatory Visit: Payer: 59 | Admitting: Family Medicine

## 2023-04-07 ENCOUNTER — Encounter: Payer: Self-pay | Admitting: Internal Medicine

## 2023-04-07 ENCOUNTER — Ambulatory Visit (HOSPITAL_BASED_OUTPATIENT_CLINIC_OR_DEPARTMENT_OTHER): Payer: No Typology Code available for payment source | Admitting: Clinical

## 2023-04-07 ENCOUNTER — Other Ambulatory Visit (SKILLED_NURSING_FACILITY): Payer: Self-pay | Admitting: Internal Medicine

## 2023-04-07 ENCOUNTER — Other Ambulatory Visit (HOSPITAL_BASED_OUTPATIENT_CLINIC_OR_DEPARTMENT_OTHER): Payer: Self-pay

## 2023-04-07 DIAGNOSIS — K5903 Drug induced constipation: Secondary | ICD-10-CM

## 2023-04-07 DIAGNOSIS — S86811S Strain of other muscle(s) and tendon(s) at lower leg level, right leg, sequela: Secondary | ICD-10-CM

## 2023-04-07 DIAGNOSIS — J449 Chronic obstructive pulmonary disease, unspecified: Secondary | ICD-10-CM

## 2023-04-07 DIAGNOSIS — I7121 Aneurysm of the ascending aorta, without rupture: Secondary | ICD-10-CM

## 2023-04-07 DIAGNOSIS — F332 Major depressive disorder, recurrent severe without psychotic features: Secondary | ICD-10-CM

## 2023-04-07 DIAGNOSIS — F419 Anxiety disorder, unspecified: Secondary | ICD-10-CM | POA: Diagnosis not present

## 2023-04-07 DIAGNOSIS — K219 Gastro-esophageal reflux disease without esophagitis: Secondary | ICD-10-CM

## 2023-04-07 DIAGNOSIS — N952 Postmenopausal atrophic vaginitis: Secondary | ICD-10-CM

## 2023-04-07 DIAGNOSIS — G8918 Other acute postprocedural pain: Secondary | ICD-10-CM | POA: Diagnosis not present

## 2023-04-07 DIAGNOSIS — N3281 Overactive bladder: Secondary | ICD-10-CM | POA: Diagnosis not present

## 2023-04-07 DIAGNOSIS — F1120 Opioid dependence, uncomplicated: Secondary | ICD-10-CM

## 2023-04-07 DIAGNOSIS — Z96651 Presence of right artificial knee joint: Secondary | ICD-10-CM | POA: Diagnosis not present

## 2023-04-07 DIAGNOSIS — S86811A Strain of other muscle(s) and tendon(s) at lower leg level, right leg, initial encounter: Secondary | ICD-10-CM

## 2023-04-08 ENCOUNTER — Telehealth (HOSPITAL_BASED_OUTPATIENT_CLINIC_OR_DEPARTMENT_OTHER): Payer: Self-pay

## 2023-04-08 ENCOUNTER — Telehealth (HOSPITAL_BASED_OUTPATIENT_CLINIC_OR_DEPARTMENT_OTHER): Payer: Self-pay | Admitting: Internal Medicine

## 2023-04-08 NOTE — Telephone Encounter (Signed)
-----   Message from Polson B sent at 04/08/2023  1:20 PM EST -----  Regarding: meds  Cheryl Dixon [id number], 67 year old, female    Calls today: Does not have pain meds in rehab after knee surgery    Person calling on behalf of patient: Patient (self)    [name] NUMBER: [phone]      Patient's language of care: English    Patient does not need an interpreter.    Patient's PCP: Hildred Laser, MD    Primary Care Home Site:  [facility]

## 2023-04-08 NOTE — Telephone Encounter (Signed)
Pt called, asks for referral to rehab in [place] they talked abt w/[facility]. Pt states "feel that their needs are being neglected bc they're (current rehab) understaffed." Examples given: "not changing bandages and changing medication instructions," re HYDROmorphone (DILAUDID) tablet 2 mg States nurse wouldn't give it to her and then she had lots of pain. Pt states changed pain med that nurse said every 6 hours, not as needed. Pt requests alternate rehab.Please advise.    Please call Pt at [phone] to advise.

## 2023-04-08 NOTE — Telephone Encounter (Signed)
She did get some pain medication today.  If possible I'll call and talk to her doctor there tomorrow.

## 2023-04-08 NOTE — Telephone Encounter (Signed)
Spoke with patient.  Patient currently at [facility].  Reports,she had Knee surgery on Nov 13th,tendon ruptured on 11/29 and had to go back.  Currently discharge to Rehab.  Reports,she was given dilaudid 2mg  ( 7days),no more pain medications from tomorrow and would like Dr Diona Foley to sent in prescriptions.  Reports,her pain is unbearable and she cannot be 'just on Tylenol'  Advised,while she is at Rehab,she should discuss to the provider/nurses,there ,declines and gets frustrated stating she talked to head nurse and this head nurse from Rehab told her ,they can give more pain medications only if they get prescriptions from her doctor.    Advised,she may  contact ortho/surgery team and follow up regarding this,states she has contacted them already and they are not responding.  Patient further states 'Dr Diona Foley is the one who asked her to do this knee surgery'.    Advised,I will relay this message to provider.        ----- Message from Ringwood B sent at 04/08/2023  1:20 PM EST -----  Regarding: meds  Margaretta Cheff [id number], 67 year old, female    Calls today: Does not have pain meds in rehab after knee surgery    Person calling on behalf of patient: Patient (self)    [name] NUMBER: [phone]      Patient's language of care: English    Patient does not need an interpreter.    Patient's PCP: Hildred Laser, MD    Primary Care Home Site:  [facility]

## 2023-04-10 ENCOUNTER — Telehealth (HOSPITAL_BASED_OUTPATIENT_CLINIC_OR_DEPARTMENT_OTHER): Payer: Self-pay | Admitting: Registered Nurse

## 2023-04-10 ENCOUNTER — Other Ambulatory Visit (SKILLED_NURSING_FACILITY): Payer: No Typology Code available for payment source | Admitting: Nurse Practitioner

## 2023-04-10 ENCOUNTER — Other Ambulatory Visit (HOSPITAL_BASED_OUTPATIENT_CLINIC_OR_DEPARTMENT_OTHER): Payer: Self-pay | Admitting: Internal Medicine

## 2023-04-10 ENCOUNTER — Other Ambulatory Visit: Payer: Self-pay

## 2023-04-10 DIAGNOSIS — K5903 Drug induced constipation: Secondary | ICD-10-CM

## 2023-04-10 DIAGNOSIS — M1711 Unilateral primary osteoarthritis, right knee: Secondary | ICD-10-CM

## 2023-04-10 DIAGNOSIS — J449 Chronic obstructive pulmonary disease, unspecified: Secondary | ICD-10-CM

## 2023-04-10 DIAGNOSIS — I6330 Cerebral infarction due to thrombosis of unspecified cerebral artery: Secondary | ICD-10-CM | POA: Diagnosis not present

## 2023-04-10 DIAGNOSIS — F1920 Other psychoactive substance dependence, uncomplicated: Secondary | ICD-10-CM

## 2023-04-10 DIAGNOSIS — N3281 Overactive bladder: Secondary | ICD-10-CM

## 2023-04-10 DIAGNOSIS — S86811D Strain of other muscle(s) and tendon(s) at lower leg level, right leg, subsequent encounter: Secondary | ICD-10-CM

## 2023-04-10 NOTE — Telephone Encounter (Signed)
Received call from [facility] [phone]  See would like to speak to site RN to discuss pt stay/dc/and follow up after [facility] rehab stay  Transferred to site

## 2023-04-10 NOTE — Telephone Encounter (Signed)
PER Pharmacy, Cheryl Dixon is a 67 year old female has requested a refill of      -  aspirin       Last Office Visit: 03/10/23 with benbasset-miller, c.       There are no preventive care reminders to display for this patient.     Other Med Adult:  Most Recent BP Reading(s)  04/07/23 : 131/66        Cholesterol (mg/dL)   Date Value   16/01/9603 212     LOW DENSITY LIPOPROTEIN DIRECT (mg/dL)   Date Value   54/12/8117 151     HIGH DENSITY LIPOPROTEIN (mg/dL)   Date Value   14/78/2956 47     TRIGLYCERIDES (mg/dL)   Date Value   21/30/8657 119         THYROID SCREEN TSH REFLEX FT4 (uIU/mL)   Date Value   08/20/2022 4.100         No results found for: "TSH"    HEMOGLOBIN A1C (%)   Date Value   08/20/2022 5.6       No results found for: "POCA1C"      INR (no units)   Date Value   12/12/2021 1.1   02/16/2007 1.0 (L)   07/03/2006 < 1.0 (L)       SODIUM (mmol/L)   Date Value   03/30/2023 137       POTASSIUM (mmol/L)   Date Value   03/30/2023 4.4           CREATININE (mg/dL)   Date Value   84/69/6295 0.8        Documented patient preferred pharmacies:    [id number] - [place], [place] - [address]  Phone: [phone] Fax: [phone]

## 2023-04-10 NOTE — Progress Notes (Signed)
[facility] NURSING HOME PROGRAM - DISCHARGE SUMMARY    Patient Name: Cheryl Dixon  Patient DOB: 1956/04/08  Facility Name: [facility]    Admission Date: 04/01/23  Discharge Date: 04/10/23    Attending Physician:Cheryl Modesto Charon, MD  Primary Care Physician:Dixon, Cheryl Hua, MD    Primary Diagnoses Requiring SNF Admission:  S/p right TKR on 03/11/23  S/p right patella tendon repair on 03/29/23    Outstanding Issues:  [ ]  Discharge was patient driven  [ ]  Pt discharge with 11 films or 8-2mg  suboxone films and 11 4-1mg  suboxone films  [ ]  Pt  wants to set up own PT services.  She plans on arranging outpateint PT with orthopedist on 12/17.  [ ]  Follow Hgb/Hct (hgb dropped from 9.8 to 8.6 post operatively)     Past Medical History:  Patient Active Problem List:     Opioid dependence on agonist therapy (HCC)     Tobacco use disorder     Fibrocystic breast     Essential hypertension     Knee pain     Chronic Cheryl Dixon back pain     OA (osteoarthritis) of knee     H. pylori infection     Major depressive disorder, recurrent episode, severe (HCC)     Occult blood in stools     Prediabetes     Epigastric pain     Chronic obstructive pulmonary disease (HCC)     Left foot pain     Multiple polyps of colon     Acquired deformity of toe, right     Dislocation of metatarsophalangeal joint of lesser toe, right, sequela     Postmenopausal atrophic vaginitis     Abnormal loss of weight     Aneurysm of ascending aorta without rupture (HCC)     COVID-19 virus infection     BMI 32.0-32.9,adult     Chronic dysuria     LLQ pain     Osteoporosis     History of endometriosis     Lung cancer screening     Constipation, drug induced     Weakness     Cerebrovascular accident (CVA) (HCC)     Overactive bladder      [facility]) [phone] Option #4     Substance addiction (HCC)     Anxiety     Obese     S/P total knee arthroplasty, right     Patellar tendon rupture, right, initial encounter     Status post total right knee  replacement     Rupture patellar tendon, right, subsequent encounter      Review of Patient's Allergies indicates:   Darvon                     Meperidine hcl              Comment:Nausea/vomit   Paxil [paroxetine]      Rash   Zoloft [sertraline *    Rash    Brief Nursing Home Course:  Cheryl Dixon is a 67 year old female who was admitted for short term rehab following hospitalization for patella tendon rupture and repair.      (M17.11) Primary osteoarthritis of right knee  (primary encounter diagnosis)    (U27.253G) Rupture patellar tendon, right, subsequent encounter  Comment: Pt underwent right TKR on 03/11/23.  Post op course was uneventful and was discharged home on 03/12/23.  She presented to ER on 12/01 with worsening  right knee pain s/p a fall and  experiencing a popping sensation.  CT scan showed  hematoma and patella tendon rupture.  Pt was taken to OR for repair.  Post op course was uneventful; however Hgb dropped from 9.8 to 8.6  Pt was transferred to [facility] for STR  She was progressed well with PT/OT.  She is ambulating independently using walker.  She has been weaned off hydromorphone.  Plan: Ortho f/u scheduled for 12/17  Set up outpatient PT    (F19.20) Substance addiction (HCC)  Comment: chronic, stable  Plan: con't Suboxone 8-2mg .  Give 1 film in morning and 1/5 films at night.    (K59.03) Constipation, drug induced  Comment: no issues of constipation on current regimen.  Plan: Colace 100mg  daily  Senokot 2 tabs nightly  Miralax 17 grams po daily    (I63.30) Cerebrovascular accident (CVA) due to thrombosis of cerebral artery (HCC)  Comment: stable  Plan: Apixaban 2.5mg  po BID    (J44.9) Chronic obstructive pulmonary disease, unspecified COPD type (HCC)  Comment: no recent acute exacerbations  Plan: con't Stiolto Respimat inhaler    (N32.81) Overactive bladder  Comment: no issues with incontinence or dysuria.  Plan: Vesicare 10mg  po daily        Discharge Exam:  VS: BP 134/72   Pulse 71   Temp 97.7 F  (36.5 C)   Resp 18   LMP 11/20/1991    Gen: alert and oriented x3, well appearing, NAD  HEENT:  oral mucosa moist, no oropharyngeal exudates or lesions  Cardiac: RRR, nl S1 and S2, no m/r/g, warm and well perfused  Pulm: CTAB, no w/r/r  Abd: soft, nontender, nondistended, +BS, no rebound/guarding  LE: no c/c/e  Skin: grossly intact, no rashes or lesions  Neuro: EOMI, PEERLA, CN II-XII grossly intact, gross motor/sensation intact throughout  Psych: affect and mood appropriate    Pertinent Labs/Studies:   04/07/23  WBC 6.5  Hgb 8.6  Hct 26.8  plat 502    Discharge Medications:  Patient's Medications   New Prescriptions    No medications on file   Previous Medications    ACETAMINOPHEN (TYLENOL) 500 MG TABLET    Take 2 tablets by mouth every 8 (eight) hours as needed for Pain    APIXABAN (ELIQUIS) 2.5 MG PO TABLET    Take 1 tablet by mouth in the morning and 1 tablet before bedtime.    ASPIRIN Sahib Dixon DOSE 81 MG CHEWABLE TABLET    TAKE 1 TABLET BY MOUTH EVERY DAY IN THE MORNING    ATORVASTATIN (LIPITOR) 80 MG TABLET    Take 1 tablet by mouth Daily after dinner    BUPRENORPHINE-NALOXONE (SUBOXONE) 4-1 MG SUBLINGUAL FILM    Place 1 Film under the tongue daily Max Daily Amount: 1 Film  for 14 days    BUPROPION (WELLBUTRIN SR) 200 MG 12 HR TABLET    TAKE 1 TABLET BY MOUTH IN THE MORNING AND BEFORE BEDTIME    BUSPIRONE (BUSPAR) 5 MG TABLET    Take 5 mg by mouth in the morning and 5 mg before bedtime.    CETIRIZINE (ZYRTEC) 10 MG TABLET    TAKE 1 TABLET BY MOUTH EVERY DAY IN THE MORNING    ESTRADIOL (ESTRACE) 0.1 MG/GM VAGINAL CREAM    PLACE 5 G VAGINALLY 3 TIMES A WEEK MYLAN BRAND ONLY.    ESTROGEN, CONJUGATED, (PREMARIN) 0.625 MG/GM VAGINAL CREAM    Place vaginally daily    FAMOTIDINE (PEPCID) 20 MG TABLET  TAKE 1 TABLET BY MOUTH IN THE MORNING AND BEFORE BEDTIME    HYDROCORTISONE 2.5 % CREAM    Apply topically 2 (two) times daily To affected areas of scalp and ears    PANTOPRAZOLE (PROTONIX) 40 MG TABLET    Take 1 tablet  by mouth daily    POLYETHYLENE GLYCOL (GLYCOLAX/MIRALAX) 17 G PACKET    Take 1 packet by mouth in the morning.    SENNOSIDES (SENOKOT) 8.6 MG TABLET    Take 2 tablets by mouth nightly    SOLIFENACIN (VESICARE) 10 MG TABLET    TAKE 1 TABLET BY MOUTH EVERY DAY IN THE MORNING    TIOTROPIUM-OLODATEROL (STIOLTO RESPIMAT) 2.5-2.5 MCG/ACT INHAL    Inhale 2 puffs into the lungs daily    VITAMIN D3 50 MCG (2000 UT) TABS TABLET    TAKE 1 TABLET BY MOUTH EVERY DAY IN THE MORNING   Modified Medications    No medications on file   Discontinued Medications    No medications on file       Discharge Instructions:  - Services: Pt to arrange PT services with [facility]  - Follow up appointments: ortho f/u scheduled for 12/17  - Please page our team at [phone] if there are any questions or concerns about this discharge.    Code Status: Full Code  HCP: Plemmons,STACEY [phone]  Is HCP activated: no    Total duration of spent for discharge management services:    more than 30 minutes ([id number])     Herma Carson, APRN  [facility]   04/10/2023

## 2023-04-13 ENCOUNTER — Telehealth (HOSPITAL_BASED_OUTPATIENT_CLINIC_OR_DEPARTMENT_OTHER): Payer: Self-pay | Admitting: Registered Nurse

## 2023-04-13 ENCOUNTER — Ambulatory Visit: Payer: No Typology Code available for payment source | Attending: Specialist | Admitting: Specialist

## 2023-04-13 ENCOUNTER — Other Ambulatory Visit: Payer: Self-pay

## 2023-04-13 ENCOUNTER — Telehealth (HOSPITAL_BASED_OUTPATIENT_CLINIC_OR_DEPARTMENT_OTHER): Payer: Self-pay | Admitting: Internal Medicine

## 2023-04-13 DIAGNOSIS — M1711 Unilateral primary osteoarthritis, right knee: Secondary | ICD-10-CM | POA: Insufficient documentation

## 2023-04-13 DIAGNOSIS — Z96651 Presence of right artificial knee joint: Secondary | ICD-10-CM | POA: Diagnosis present

## 2023-04-13 DIAGNOSIS — S86811D Strain of other muscle(s) and tendon(s) at lower leg level, right leg, subsequent encounter: Secondary | ICD-10-CM | POA: Insufficient documentation

## 2023-04-13 DIAGNOSIS — F1120 Opioid dependence, uncomplicated: Secondary | ICD-10-CM | POA: Insufficient documentation

## 2023-04-13 DIAGNOSIS — I6330 Cerebral infarction due to thrombosis of unspecified cerebral artery: Secondary | ICD-10-CM | POA: Insufficient documentation

## 2023-04-13 MED ORDER — OXYCODONE HCL 5 MG PO TABS
5.0000 mg | ORAL_TABLET | Freq: Three times a day (TID) | ORAL | 0 refills | Status: DC | PRN
Start: 2023-04-13 — End: 2023-04-17

## 2023-04-13 NOTE — Progress Notes (Signed)
 The patient is here for a follow-up regarding recent knee surgery.  She is being seen on an urgent basis.  The patient and the patient's daughter arrived to the clinic today to be evaluated.    This patient is now approximately 1 month status post uncomplicated cemented total knee arthroplasty, right.  However, approximately 2 weeks postoperatively she sustained a fall resulting in a patella tendon disruption complicated further by eventual wound dehiscence.  She was cared for by Dr. Lennie Muckle regarding the acute tendon disruption and wound dehiscence.  On March 29, 2023 the patient had irrigation and debridement of the wound, extensor tendon repair with cerclage wire reinforcement, poly exchange, placement of antibiotic beads and primary wound closure.    She was placed into a knee immobilizer.  She was advised to keep the knee in the extended position.    She was then discharged to [facility] rehabilitation.  At that facility she did have some physical therapy.  She was discharged home on December 13.    Through this prior weekend the patient became somewhat overwhelmed regarding the current scenario.  She was not clear relative to follow-up treatment and visits, struggled with pain control, and hopes to have home care set up.    For acute pain she had been using Dilaudid.  She also remains on Suboxone given history of opioid dependence disorder in remission.    She is on Eliquis 2.5 mg twice daily for 4-week course.    On exam the knee immobilizer was removed.  She has neutral alignment of the right lower extremity.  Some boggy fullness of the right knee with no untoward swelling.  Wound is healing well with some minor eschar at the distal third.  Minimal surrounding rubor.  Calf is soft and nontender.  With some assistance she is able to straight leg raise.    I reviewed postoperative x-rays with the patient and with the patient's daughter.  The films show a cemented total knee arthroplasty in place with all  components in good position.  On the lateral view patella is in normal position with no untoward patella alto.  Cerclage cable is in place.    The patient is progressing satisfactorily after above-noted procedures.  I did advise the patient that she must be in the knee immobilizer full-time except for skin care and showering.  It is imperative that she protect the repair and keep the knee straight.  She should work with the physical therapist to maintain some lower leg muscle strength.  I further advised that she does have a cerclage wire in place to protect the repair.  Down the line that will need to be removed.  She was also advised that she should follow-up with Dr. Lennie Muckle as scheduled on December 17.    She does have pain postoperatively as would be anticipated.  She is on Suboxone therapy chronically.  Acute pain management needs will need to be managed by her primary care team under the direction of Dr. Diona Foley.  She should reach out to Dr. Diona Foley regarding acute pain management.  I will follow-up with Dr. Diona Foley, as well.    She will require home care services with nursing and PT.  It looks as if [facility] was to begin therapy services.  The staff will reach out to the [facility] to make sure home care will begin.

## 2023-04-13 NOTE — Telephone Encounter (Signed)
 Pt discharged 12/13.  See notes from 12/16.

## 2023-04-13 NOTE — Telephone Encounter (Signed)
 Discussed with patient

## 2023-04-13 NOTE — Telephone Encounter (Addendum)
12/17  1040 a  I called [facility] to see if they accepted pt,  I spoke with Jody     Pt is set up for resumption of care,  Starting today,  they will reach out to pt    Message sent to Dr Sallee Lange,  [facility]      Referral Information faxed at 12 p today to [facility]  Will call tomorrow to see if approved              ----- Message from Gabriel Rainwater sent at 04/13/2023 11:33 AM EST -----  Regarding: [facility] referral  I placed a new order for VNA.  It seems as if communication with VNA was somewhat dropped after recent discharge from [facility] rehab.  The patient went home on Friday, December 13.  Can 1 of you reach out to [facility] to get things rolling?  Thanks.  Bill.

## 2023-04-13 NOTE — Telephone Encounter (Signed)
 Spoke with Cheryl Dixon regarding her recent surgery.  She is home after an appointment with Dr Sallee Lange today.  She is aware that she has an appointment tomorrow at [facility] with Dr Lennie Muckle at 9:45.  She is currently taking her suboxone as ordered 8mg / 2.5 strips daily.  She has been taking po dilaudid 2 mg for pain which she states work very well on top of the suboxone.    She only has one left for tonight and will be all out.  She is concerned as she has an appointment in the morning and also will be going for an x ray.  She is currently in an immobilizer.    Also waiting to hear about PT and VNA.  I advised her that I would speak with Dr Diona Foley today about her medication.  She is asking [facility] to [facility] [address]  Will continue to follow.

## 2023-04-13 NOTE — Patient Instructions (Signed)
 1.  Please be sure to keep the follow-up appointment scheduled for tomorrow, December 17, 9:45 AM at the [facility] orthopedic clinic with Dr. Lennie Muckle.    2.  Continue to keep your leg elevated when possible.  You should have the knee immobilizer on at all times except for shower.  It is okay to shower leave the dressing in place.  Continue to use a walker when walking.  You can still do some straight leg raise exercises as shown by the therapist.    3.  Continue on your Eliquis blood thinner to prevent a blood clot.    4.  Please be in touch with Dr. Larinda Buttery office regarding the visiting nurse assessment and any needed pain medication.  Since you are on Suboxone Dr. Larinda Buttery office will need to coordinate management of acute pain.    5.  Keep the dressing in place until your appointment tomorrow.

## 2023-04-13 NOTE — Addendum Note (Signed)
 Addended by: Naylea Wigington on: 04/13/2023 04:06 PM     Modules accepted: Orders

## 2023-04-13 NOTE — Telephone Encounter (Signed)
 Rn -- I called patient to discuss pain management.  If she calls back, please find out what she is currently taking for pain and I'll send in medications as needed.  She was discharged from [facility] on Friday.

## 2023-04-14 ENCOUNTER — Ambulatory Visit (HOSPITAL_BASED_OUTPATIENT_CLINIC_OR_DEPARTMENT_OTHER): Payer: No Typology Code available for payment source | Admitting: Orthopaedic Surgery

## 2023-04-14 ENCOUNTER — Ambulatory Visit
Admission: RE | Admit: 2023-04-14 | Discharge: 2023-04-14 | Disposition: A | Discharge status: Home / Self Care | Payer: No Typology Code available for payment source | Attending: Diagnostic Radiology | Admitting: Diagnostic Radiology

## 2023-04-14 DIAGNOSIS — Z4789 Encounter for other orthopedic aftercare: Secondary | ICD-10-CM | POA: Diagnosis present

## 2023-04-14 DIAGNOSIS — S86811D Strain of other muscle(s) and tendon(s) at lower leg level, right leg, subsequent encounter: Secondary | ICD-10-CM | POA: Diagnosis not present

## 2023-04-14 DIAGNOSIS — S86811A Strain of other muscle(s) and tendon(s) at lower leg level, right leg, initial encounter: Secondary | ICD-10-CM

## 2023-04-14 NOTE — Telephone Encounter (Signed)
Attempted to return call,unable to reach,left message on voicemail to return call.      ----- Message from Louanne Skye sent at 04/14/2023  2:28 PM EST -----  Regarding: would like to speak to Dr.Roll  Cheryl Dixon [id number], 67 year old, female, Telephone Information:  Home Phone      [phone]  Work Phone      Not on file.  Mobile          [phone]           Calls today: Other: Pt called--would like to talk to Dr.Roll--States she has had 2 surgeries and now Ortho stated she needs to do another one--would like to talk to Dr.Roll about what she should do as this would be 3 surgeries in 1 month--please advise     Person calling on behalf of patient: Patient (self)    CALL BACK NUMBER: [phone]    Patient's language of care: English    Patient does not need an interpreter.    Patient's PCP: Hildred Laser, MD

## 2023-04-14 NOTE — Telephone Encounter (Signed)
Has follow up appt scheduled on  12/24    Received call from [facility] [phone]  See would like to speak to site RN to discuss pt stay/dc/and follow up after [facility] rehab stay  Transferred to site

## 2023-04-14 NOTE — Telephone Encounter (Signed)
-----   Message from Calpella P sent at 04/14/2023  2:28 PM EST -----  Regarding: would like to speak to Dr.Roll  Ahmarie Un [id number], 67 year old, female, Telephone Information:  Home Phone      [phone]  Work Phone      Not on file.  Mobile          [phone]           Calls today: Other: Pt called--would like to talk to Dr.Roll--States she has had 2 surgeries and now Ortho stated she needs to do another one--would like to talk to Dr.Roll about what she should do as this would be 3 surgeries in 1 month--please advise     Person calling on behalf of patient: Patient (self)    CALL BACK NUMBER: [phone]    Patient's language of care: English    Patient does not need an interpreter.    Patient's PCP: Hildred Laser, MD

## 2023-04-16 NOTE — Telephone Encounter (Signed)
Second call placed and spoke to patient.  Reports,she called our office the other day out of panic,states she knows that she is been told she needs 'third surgery' of her knee.  She just wants Dr Diona Foley to be aware that they are taking about third surgery to fix her patella because the second surgery/repair did not go well.  Has appt with otho on 12/27.  Reports,pain is better controlled with oxycodone and Suboxone.  Advised,I will relay this message to PCP.

## 2023-04-17 ENCOUNTER — Telehealth (HOSPITAL_BASED_OUTPATIENT_CLINIC_OR_DEPARTMENT_OTHER): Payer: Self-pay | Admitting: Registered Nurse

## 2023-04-17 ENCOUNTER — Encounter (HOSPITAL_BASED_OUTPATIENT_CLINIC_OR_DEPARTMENT_OTHER): Payer: Self-pay

## 2023-04-17 MED ORDER — OXYCODONE HCL 5 MG PO TABS
5.0000 mg | ORAL_TABLET | Freq: Four times a day (QID) | ORAL | 0 refills | Status: AC | PRN
Start: 2023-04-18 — End: 2023-04-22

## 2023-04-17 NOTE — Telephone Encounter (Signed)
I spoke to patient.    Oxycodone in working well but she is taking four per day, has enough left for 1 more daqy.  Plan refill tomorrow at 4 per day through next Tuesday.  Has questions related to next surgery and diet which I will forward to surgery team.  She is having trouble with the brace, and I encouraged her to work with PT and surgery team to sort that out.

## 2023-04-17 NOTE — Telephone Encounter (Signed)
 Spoke with patient's daughter stacy.  Kennyth Arnold called to obtain more information about the care for her mother. Kennyth Arnold wants to get a little more information about the results of the surgery her mother obtained on 12/1 and the need for a third surgery. Pt is very anxious and is confused her self about her care. Kennyth Arnold wants to be certain about the care and after care for the pt so she can rely the information to her mother to decrease anxiety. Kennyth Arnold also wondered why the third surgery hasn't been booked let. Kennyth Arnold was told Dr.van Truman Hayward will be call today to see if she can better inform family of pt's care. Pt states understanding and had no further questions for staff.

## 2023-04-20 ENCOUNTER — Encounter (HOSPITAL_BASED_OUTPATIENT_CLINIC_OR_DEPARTMENT_OTHER): Payer: Self-pay

## 2023-04-20 ENCOUNTER — Other Ambulatory Visit: Payer: Self-pay

## 2023-04-20 ENCOUNTER — Ambulatory Visit
Admission: RE | Admit: 2023-04-20 | Discharge: 2023-04-20 | Disposition: A | Discharge status: Home / Self Care | Payer: No Typology Code available for payment source

## 2023-04-20 NOTE — Surgery Pre-Op (Signed)
 PAT Visit          Cheryl Dixon is a 67 year old female received a preoperative screening.        Appointments for Next 30 Days 04/20/2023 - 05/20/2023        Date Visit Type Department Provider     04/21/2023 10:30 AM TELEVISIT GROUP PC [facility] - [facility] Azzie Glatter, MD    Patient Instructions:     This group will take place by video or in some cases by phone. You will receive an email with instructions on how to join this visit by Google Meet prior to the appointment.               04/21/2023 10:30 AM PS TELEVISIT VIDEO GROUP 90 [facility] - [facility] - Psychiatry Milta Deiters, PhD    Patient Instructions:     This group will take place by video or in some cases by phone. You will receive an email with instructions on how to join this visit by Google Meet prior to the appointment.               04/24/2023  9:15 AM ORTHO 15 F/U  [facility] - [facility] - Orthopedics Gabriel Rainwater, MD    Patient Instructions:                       Surgery Information       Future Procedures (Tomorrow to 04/19/2024)         Date Time Visit Type/Procedure Providers Loc / Dept Status        05/04/2023 0700 REPAIR, TENDON, PATELLAR - Right [name] [facility]      Visit Type/Procedure: REPAIR, TENDON, PATELLAR - Right                          Procedure(s):  REPAIR, TENDON, PATELLAR  05/04/2023        .     Language of care:English    Allergy History  Darvon, Meperidine Hcl, Paxil [Paroxetine], and Zoloft [Sertraline Hcl]      Past Medical History:  No date: Anxiety  No date: Arthritis  No date: COPD (chronic obstructive pulmonary disease) (HCC)  No date: Depression  No date: Obese  No date: Substance addiction (HCC)  No date: Varicose veins of lower extremities     has a past surgical history that includes ANES HRNA REPAIR UPR ABD TABDL RPR DIPHRG HRNA; ENDOMETRIAL ABLTJ THERMAL W/O HYSTEROSCOPIC GID; ANES IPR LOWER ABD W/LAPS RAD HYSTERECTOMY; RPR 1ST INGUN HRNA  PRETERM INFT [facility]; and Colonoscopy (2023).         Alcohol, Tobacco and Drug History:  Social History    Tobacco Use      Smoking status: Former        Packs/day: 0.00        Years: 1 pack/day for 32.0 years (32.0 ttl pk-yrs)        Types: Cigarettes        Start date: 09/26/1989        Quit date: 09/26/2021        Years since quitting: 1.5        Passive exposure: Never      Smokeless tobacco: Never      Tobacco comments: quit 1980-1996, then light until 2003, then 1 ppd since    E-Cigarette/Vaping  E-Cigarette Use: Never User  Quit Date:   Nicotine:   Start Date:   THC:    Cartridges/Day:   CBD:   Other Substance:   Flavoring:       Alcohol use   No       Drug use:         Types:   Marijuana      Comment:   marijuana- last dose sunday 03/08/23 past opioids, nasal heroin, 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  MJ 1x per week          Extended Emergency Contact Information  Primary Emergency Contact: Nickell,STACY  Address: [address]           [place], [place] [address] [place]  Home Phone: [phone]  Relation: Daughter        Current Outpatient Medications   Medication Sig    oxyCODONE (ROXICODONE) 5 MG immediate release tablet Take 1 tablet by mouth every 6 (six) hours as needed for Pain Max Daily Amount: 20 mg  for up to 4 days    ASPIRIN LOW DOSE 81 MG chewable tablet TAKE 1 TABLET BY MOUTH EVERY DAY IN THE MORNING    sennosides (SENOKOT) 8.6 MG tablet Take 2 tablets by mouth nightly    estrogen, conjugated, (PREMARIN) 0.625 MG/GM vaginal cream Place vaginally daily    busPIRone (BUSPAR) 5 MG tablet Take 5 mg by mouth in the morning and 5 mg before bedtime.    acetaminophen (TYLENOL) 500 MG tablet Take 2 tablets by mouth every 8 (eight) hours as needed for Pain    solifenacin (VESICARE) 10 MG tablet TAKE 1 TABLET BY MOUTH EVERY DAY IN THE MORNING    hydrocortisone 2.5 % cream Apply topically 2 (two) times daily To affected areas of scalp and ears    famotidine (PEPCID) 20 MG tablet TAKE 1 TABLET BY MOUTH IN THE MORNING AND  BEFORE BEDTIME    apixaban (ELIQUIS) 2.5 MG po tablet Take 1 tablet by mouth in the morning and 1 tablet before bedtime.    tiotropium-olodaterol (STIOLTO RESPIMAT) 2.5-2.5 MCG/ACT inhal Inhale 2 puffs into the lungs daily    buPROPion (WELLBUTRIN SR) 200 MG 12 hr tablet TAKE 1 TABLET BY MOUTH IN THE MORNING AND BEFORE BEDTIME    cetirizine (ZYRTEC) 10 MG tablet TAKE 1 TABLET BY MOUTH EVERY DAY IN THE MORNING    VITAMIN D3 50 MCG (2000 UT) TABS tablet TAKE 1 TABLET BY MOUTH EVERY DAY IN THE MORNING    atorvastatin (LIPITOR) 80 MG tablet Take 1 tablet by mouth Daily after dinner    polyethylene glycol (GLYCOLAX/MIRALAX) 17 g packet Take 1 packet by mouth in the morning.     No current facility-administered medications for this encounter.         (Not in a hospital admission)      PAT Assessment    Airways WDL     Activity tolerance (How many flights of stairs): 2    Assistive Device: Standard walker           PAT Screening     Row Name 04/20/23 1354       Integumentary - Complete full head to toe skin assessment    Integumentary (WDL) WDL    Row Name 04/20/23 1354       STOP-Bang Questionaire     Do you snore loudly (loud enough to be heard through closed doors or   your bed-partner elbows you for snoring at night? 0  Do you often feel Tired, fatigued, or Sleepy during the daytime (such as   falling asleep when driving)? 0    Has anyone observed you stop breathing, or choking/gasping during your   sleep? 0    Height 5\' 1"  (1.549 m)    Weight 88.5 kg (195 lb)    BMI (Calculated) 36.92    Neck size large? 0                       04/20/23  1354   Weight: 88.5 kg (195 lb)   Height: 5\' 1"  (1.549 m)           Patient issues currently are none .  Medication instructions were given. AVS given to patient.   Ensure instuctions given and reviewed with patient as well as all medications.  All patients questions were answered.      Darlys Gales, RN

## 2023-04-20 NOTE — Progress Notes (Signed)
Subjective   Cheryl Dixon is a 67 year old female who presents for Surgical Followup  History of Present Illness  Cheryl Dixon, a 67 year old individual, initially presented for a reassessment following a reconstruction of the right patellar tendon. This was performed after an open rupture post a right total knee replacement. Unfortunately, the primary repair appears to have failed, as evidenced by patella alta and displacement of the cable used for reinforcement. The cable, while still well fixed to the bone, has not maintained the appropriate position of the patella and has lost fixation proximally.    Cheryl Dixon reported issues with the knee immobilizer sliding down the leg, which led to a fall and subsequent bending of the knee. This incident is likely the cause of the re-injury. Despite the open injury, there was no evidence of infection, erythema, discharge, or contamination.    Cheryl Dixon's leg's large diameter further complicates the issue with the knee immobilizer. The complexity of the problem and the need for specialty care were understood.    Review of Systems    Objective   Last menstrual period 11/20/1991.  Physical Exam  MUSCULOSKELETAL: Right patellar tendon failed to maintain appropriate position of the patella and lost fixation proximally. Patella alta and displacement of the cable noted, cable still well fixed to the bone but has lost fixation proximally.  SKIN: No erythema, no discharge, no evidence of contamination.    Results      Assessment & Plan  Failed Patellar Tendon Repair  Patella alta and displacement of the cable noted. The cable is well fixed to the bone but has lost fixation proximally. No evidence of infection. The patient reported issues with the knee immobilizer sliding down her leg, leading to a fall and likely re-injury.  -Plan for reconstruction of the patellar tendon with a mesh graft by Dr. Lorel Monaco.  -Resized for a different knee immobilizer.  -Will likely necessitate casting post  reconstruction.  -Follow up with Dr. Lorel Monaco for further planning.    Attestation      The patient verbally consented to an audio recording of their visit to assist with the completion of documentation. The patient is aware the recording is not retained after the visit is summarized.

## 2023-04-20 NOTE — Discharge Instructions (Addendum)
 SURGICAL DAY CARE PRE-OPERATIVE INSTRUCTIONS    DAY OF SURGERY    Arrive at: [facility] Registration on Monday, January  6      You MUST have a responsible adult available to accompany you home after surgery.  You cannot take a taxi, [employer] or Lyft home alone unless accompanied by another adult.  (We suggest you that you have someone available to assist you at home after your surgery)     INSTRUCTIONS:     You may have nothing to eat after midnight the night before your surgery.    Please drink 32 oz (1 L) of electrolyte rich fluid in the 12 hours leading up to surgery, to end by 2 hours before the start time of surgery (in addition to the ERAS protocol).  You may drink clear liquids up until two hours before your arrival time.  Examples of clear liquids are:  water  electrolyte drinks such as Gatorade, Gatorade Zero, Pedialyte, or Vitamin water  one cup of Black coffee or tea with NO CREAM OR MILK    Do not smoke or vape the morning of your surgery     Please leave all valuables at home, including jewelry, watches, money, etc.    Please remove all fingernail polish and do not wear any makeup     If you are having eye surgery, do not wear any eye or face makeup and avoid facial moisturizers and perfumes.    Please take out any hair extensions that can be removed.    Do not shave your surgical site.    Do not wear contact lenses.    MEDICATIONS:   Eliquis should be done but if not,Stop taking 2 days before your surgery. Last dose should be on 01/032025  Take the following medication the night before surgery at bedtime: usual medications    Take the following medication the morning of your surgery with only a sip of water: all usual medications

## 2023-04-20 NOTE — Telephone Encounter (Signed)
 Verified patient Cheryl Dixon DOB July 12, 1955 on 04/20/23 by Nonda Lou RN, BSN          Patient is calling to regarding issue with pain management medication oxycodone 5 mg. She was told by her pharmacy they cannot fill her medication until the 27th. States she has spoken with PCP about new rx and dose increase last week. Advised I would send message to PCP/site team to confirm medication dosing and follow up with the pharmacy as needed.

## 2023-04-21 ENCOUNTER — Ambulatory Visit: Payer: No Typology Code available for payment source | Admitting: Family Medicine

## 2023-04-21 ENCOUNTER — Ambulatory Visit (HOSPITAL_BASED_OUTPATIENT_CLINIC_OR_DEPARTMENT_OTHER): Payer: No Typology Code available for payment source | Admitting: Clinical

## 2023-04-21 ENCOUNTER — Other Ambulatory Visit (HOSPITAL_BASED_OUTPATIENT_CLINIC_OR_DEPARTMENT_OTHER): Payer: Self-pay | Admitting: Family Medicine

## 2023-04-21 DIAGNOSIS — F1120 Opioid dependence, uncomplicated: Secondary | ICD-10-CM

## 2023-04-21 NOTE — Progress Notes (Signed)
 PDMP reviewed appears not yet due for script, OBAT nurse to follow up. Pt has been recovering from knee surgery    Azzie Glatter, MD

## 2023-04-24 ENCOUNTER — Ambulatory Visit: Payer: No Typology Code available for payment source | Admitting: Specialist

## 2023-04-27 ENCOUNTER — Other Ambulatory Visit (HOSPITAL_BASED_OUTPATIENT_CLINIC_OR_DEPARTMENT_OTHER): Payer: Self-pay | Admitting: Internal Medicine

## 2023-04-27 MED ORDER — OXYCODONE HCL 5 MG PO TABS
5.0000 mg | ORAL_TABLET | Freq: Three times a day (TID) | ORAL | 0 refills | Status: AC | PRN
Start: 2023-04-27 — End: 2023-05-04

## 2023-04-27 NOTE — Telephone Encounter (Signed)
 PER Patient (self),NAME@ is a 67 year old female has requested a refill of oxycodone       Last prescribed - start date: 04/18/23 end date: 04/22/23     Last Office Visit: 03/05/23    There are no preventive care reminders to display for this patient.     Other Med Adult:  Most Recent BP Reading(s)  04/10/23 : 134/72        Cholesterol (mg/dL)   Date Value   16/01/9603 212     LOW DENSITY LIPOPROTEIN DIRECT (mg/dL)   Date Value   54/12/8117 151     HIGH DENSITY LIPOPROTEIN (mg/dL)   Date Value   14/78/2956 47     TRIGLYCERIDES (mg/dL)   Date Value   21/30/8657 119         THYROID SCREEN TSH REFLEX FT4 (uIU/mL)   Date Value   08/20/2022 4.100         No results found for: "TSH"    HEMOGLOBIN A1C (%)   Date Value   08/20/2022 5.6       No results found for: "POCA1C"      INR (no units)   Date Value   12/12/2021 1.1   02/16/2007 1.0 (L)   07/03/2006 < 1.0 (L)       SODIUM (mmol/L)   Date Value   03/30/2023 137       POTASSIUM (mmol/L)   Date Value   03/30/2023 4.4           CREATININE (mg/dL)   Date Value   84/69/6295 0.8        Documented patient preferred pharmacies:    [id number] [place] [place] - [address]  Phone: [phone] Fax: [phone]

## 2023-04-28 NOTE — Telephone Encounter (Signed)
 PER Pharmacy, Cheryl Dixon is a 66 year old female has requested a refill of colace.      Last OFFICE/TELE Visit:  03/10/23 with benbasset-miller, c      Last Physical Exam:   05/27/2013     There are no preventive care reminders to display for this patient.    Other Med Adult:  Most Recent BP Reading(s)  04/10/23 : 134/72        Cholesterol (mg/dL)   Date Value   16/01/9603 212     LOW DENSITY LIPOPROTEIN DIRECT (mg/dL)   Date Value   54/12/8117 151     HIGH DENSITY LIPOPROTEIN (mg/dL)   Date Value   14/78/2956 47     TRIGLYCERIDES (mg/dL)   Date Value   21/30/8657 119         THYROID SCREEN TSH REFLEX FT4 (uIU/mL)   Date Value   08/20/2022 4.100         No results found for: "TSH"    HEMOGLOBIN A1C (%)   Date Value   08/20/2022 5.6       No results found for: "POCA1C"      INR (no units)   Date Value   12/12/2021 1.1   02/16/2007 1.0 (L)   07/03/2006 < 1.0 (L)       SODIUM (mmol/L)   Date Value   03/30/2023 137       POTASSIUM (mmol/L)   Date Value   03/30/2023 4.4           CREATININE (mg/dL)   Date Value   84/69/6295 0.8       Documented patient preferred pharmacies:    [id number] [place], [place] - [address]  Phone: [phone] Fax: [phone]

## 2023-05-03 ENCOUNTER — Encounter (HOSPITAL_BASED_OUTPATIENT_CLINIC_OR_DEPARTMENT_OTHER): Payer: Self-pay | Admitting: Physician Assistant

## 2023-05-03 NOTE — Progress Notes (Addendum)
Patient was referred to Dr. Lorel Monaco by Dr. Lennie Muckle for revision of a patellar tendon rupture in the setting of a TKA.    She is currently booked for tomorrow 05/04/23. However, the surgical plan has changed and we are contemplating fully revising the knee replacement as well as repairing the tendon. In light of this, we would prefer to see the patient in the clinic tomorrow instead of having surgery to meet with Dr. Lorel Monaco and have a thorough discussion about the surgical plan so we are all on the same page.    I called and notified the patient. She is tearful but understanding. She requested I call her daughter Misty Stanley who is listed in the chart. Called x 3, unable to leave VM as her mailbox is full. Called Rad[facility] back and left a more detailed VM: we would like to see her tomorrow at [facility] around 11AM.    OSM aware.    Myer Haff, PA-C, 05/03/2023      ADDENDUM: Called patient's daughter Misty Stanley and spoke on the phone around 5:50 pm. Relayed that surgery is being postponed and that we would like to regroup in clinic regarding current diagnoses and projected surgical intervention. She understands. She states her mom is a bit overwhelmed. She has some problems with getting rides. She is unsure if the patient will be able to make it in tomorrow at 11 AM. She is going to touch base with her mom. Our office will reach out in the AM to see if we can organize a follow up and potentially help in some way with the ride situation. Encouraged them to bring in a list of questions regarding surgery and post-op recovery timelines. Message was sent to our administrative support staff.    Myer Haff, PA-C, 05/03/2023

## 2023-05-04 DIAGNOSIS — S86811D Strain of other muscle(s) and tendon(s) at lower leg level, right leg, subsequent encounter: Secondary | ICD-10-CM | POA: Insufficient documentation
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
## 2023-05-05 ENCOUNTER — Ambulatory Visit: Payer: No Typology Code available for payment source | Attending: Psychosomatic Medicine | Admitting: Clinical

## 2023-05-05 ENCOUNTER — Ambulatory Visit: Payer: No Typology Code available for payment source | Attending: Family Medicine | Admitting: Family Medicine

## 2023-05-05 ENCOUNTER — Encounter (HOSPITAL_BASED_OUTPATIENT_CLINIC_OR_DEPARTMENT_OTHER): Payer: Self-pay

## 2023-05-05 DIAGNOSIS — F1120 Opioid dependence, uncomplicated: Secondary | ICD-10-CM | POA: Insufficient documentation

## 2023-05-05 DIAGNOSIS — F3341 Major depressive disorder, recurrent, in partial remission: Secondary | ICD-10-CM | POA: Insufficient documentation

## 2023-05-05 MED ORDER — BUPRENORPHINE HCL-NALOXONE HCL 8-2 MG SL FILM
ORAL_FILM | SUBLINGUAL | 0 refills | Status: DC
Start: 2023-05-05 — End: 2023-06-02

## 2023-05-05 NOTE — Progress Notes (Signed)
Diaja On is a 68 year old female seen in follow up for opioid use disorder:    Buprenorphine dose: 2.5  tabs daily (x8mg )  Response, adequacy of dose: requiring more for pain control  Relapses/close calls: No  Meetings: attends every 4+ week  Non-prescribed substance use: None reported    Social history/events:  Taking up to 3.5 films a day due to ongoing knee pain  Taking oxycodone for pain control PRN, written for BID but trying to use less  Notes she has stopped using clonazepam x 6 months and since then her thinking has gotten much clearer    Additional psychiatric or medical care/events:Orthopedics apt tomorrow, may need second surgery    Present Medications:  docusate sodium (COLACE) 100 MG capsule, TAKE 1 CAPSULE BY MOUTH IN THE MORNING AND BEFORE BEDTIME, Disp: 180 capsule, Rfl: 3  ASPIRIN LOW DOSE 81 MG chewable tablet, TAKE 1 TABLET BY MOUTH EVERY DAY IN THE MORNING, Disp: 90 tablet, Rfl: 3  estrogen, conjugated, (PREMARIN) 0.625 MG/GM vaginal cream, Place vaginally daily, Disp: , Rfl:   busPIRone (BUSPAR) 5 MG tablet, Take 5 mg by mouth in the morning and 5 mg before bedtime., Disp: , Rfl:   solifenacin (VESICARE) 10 MG tablet, TAKE 1 TABLET BY MOUTH EVERY DAY IN THE MORNING, Disp: 90 tablet, Rfl: 0  hydrocortisone 2.5 % cream, Apply topically 2 (two) times daily To affected areas of scalp and ears, Disp: 56 g, Rfl: 1  famotidine (PEPCID) 20 MG tablet, TAKE 1 TABLET BY MOUTH IN THE MORNING AND BEFORE BEDTIME, Disp: 180 tablet, Rfl: 0  tiotropium-olodaterol (STIOLTO RESPIMAT) 2.5-2.5 MCG/ACT inhal, Inhale 2 puffs into the lungs daily, Disp: , Rfl:   buPROPion (WELLBUTRIN SR) 200 MG 12 hr tablet, TAKE 1 TABLET BY MOUTH IN THE MORNING AND BEFORE BEDTIME, Disp: 180 tablet, Rfl: 3  cetirizine (ZYRTEC) 10 MG tablet, TAKE 1 TABLET BY MOUTH EVERY DAY IN THE MORNING, Disp: 90 tablet, Rfl: 3  VITAMIN D3 50 MCG (2000 UT) TABS tablet, TAKE 1 TABLET BY MOUTH EVERY DAY IN THE MORNING, Disp: 90 tablet, Rfl:  3  atorvastatin (LIPITOR) 80 MG tablet, Take 1 tablet by mouth Daily after dinner, Disp: 90 tablet, Rfl: 3  polyethylene glycol (GLYCOLAX/MIRALAX) 17 g packet, Take 1 packet by mouth in the morning., Disp: 90 packet, Rfl: 3    No current facility-administered medications on file prior to visit.      Last UDS:  LAST UDS   AMPHETAMINES URINE (ng/mL)   Date Value   10/27/2022 NEGATIVE     COCAINE METABOLITES URINE (ng/mL)   Date Value   10/27/2022 NEGATIVE     OPIATES URINE (ng/mL)   Date Value   10/27/2022 NEGATIVE     BENZODIAZEPINES URINE (ng/mL)   Date Value   10/27/2022 NEGATIVE     CANNABINOIDS URINE (ng/mL)   Date Value   10/27/2022 POSITIVE (A)     ETHANOL URINE (mg/dL)   Date Value   25/95/6387 NEGATIVE       Review of Systems: no notable symptoms    PHYSICAL EXAMINATION: by video  General appearance - healthy female in no distress  Eyes - pupils normal  Skin - warm and dry  Neuro - nonfocal  Affect - normal    Problem List Items Addressed This Visit          Mental Health    Opioid dependence on agonist therapy (HCC)    Relevant Medications    buprenorphine-naloxone (SUBOXONE) 8-2  MG sublingual film        Today in group we discussed group agreements and goals for the new year. [name] contributed.    Assessment:  Opioid use disorder  Comment: the patient actively participated and shared experiences, interacting appropriately with group without barriers to understanding  Plan:    - Increase suboxone to 3.5 films daily. Dose TID (1.5 films in AM, 1 film mid day, and 1 film at night) for pain control. Patient agrees to call clinic if pain is not controlled on this regimen. If she requires surgery she will likely need additional pain medication as well.  Patient is counseled regarding relapse prevention, involvement in recovery groups, and potential side effects of buprenorphine.    Azzie Glatter, MD, 05/06/2023

## 2023-05-06 ENCOUNTER — Ambulatory Visit (HOSPITAL_BASED_OUTPATIENT_CLINIC_OR_DEPARTMENT_OTHER): Payer: No Typology Code available for payment source

## 2023-05-06 ENCOUNTER — Ambulatory Visit: Payer: No Typology Code available for payment source | Attending: Emergency Medicine | Admitting: Orthopaedic Surgery

## 2023-05-06 ENCOUNTER — Encounter (HOSPITAL_BASED_OUTPATIENT_CLINIC_OR_DEPARTMENT_OTHER): Payer: Self-pay | Admitting: Registered Nurse

## 2023-05-06 ENCOUNTER — Other Ambulatory Visit: Payer: Self-pay

## 2023-05-06 DIAGNOSIS — S86811D Strain of other muscle(s) and tendon(s) at lower leg level, right leg, subsequent encounter: Secondary | ICD-10-CM | POA: Diagnosis present

## 2023-05-06 DIAGNOSIS — Z96651 Presence of right artificial knee joint: Secondary | ICD-10-CM | POA: Insufficient documentation

## 2023-05-06 DIAGNOSIS — Z87828 Personal history of other (healed) physical injury and trauma: Secondary | ICD-10-CM | POA: Diagnosis present

## 2023-05-06 LAB — CBC, PLATELET & DIFFERENTIAL
ABSOLUTE BASO COUNT: 0.1 10*3/uL (ref 0.0–0.1)
ABSOLUTE EOSINOPHIL COUNT: 0.1 10*3/uL (ref 0.0–0.8)
ABSOLUTE IMM GRAN COUNT: 0.08 10*3/uL (ref 0.00–0.10)
ABSOLUTE LYMPH COUNT: 1.4 10*3/uL (ref 0.6–5.9)
ABSOLUTE MONO COUNT: 0.6 10*3/uL (ref 0.2–1.4)
ABSOLUTE NEUTROPHIL COUNT: 5.8 10*3/uL (ref 1.6–8.3)
ABSOLUTE NRBC COUNT: 0 10*3/uL (ref 0.0–0.0)
BASOPHIL %: 0.8 % (ref 0.0–1.2)
EOSINOPHIL %: 1.3 % (ref 0.0–7.0)
HEMATOCRIT: 39.5 % (ref 34.1–44.9)
HEMOGLOBIN: 11.7 g/dL (ref 11.2–15.7)
IMMATURE GRANULOCYTE %: 1 % (ref 0.0–1.0)
LYMPHOCYTE %: 17 % (ref 15.0–54.0)
MEAN CORP HGB CONC: 29.6 g/dL — ABNORMAL LOW (ref 31.0–37.0)
MEAN CORPUSCULAR HGB: 25.3 pg — ABNORMAL LOW (ref 26.0–34.0)
MEAN CORPUSCULAR VOL: 85.5 fL (ref 80.0–100.0)
MEAN PLATELET VOLUME: 10.1 fL (ref 8.7–12.5)
MONOCYTE %: 7.8 % (ref 4.0–13.0)
NEUTROPHIL %: 72.1 % (ref 40.0–75.0)
NRBC %: 0 % (ref 0.0–0.0)
PLATELET COUNT: 351 10*3/uL (ref 150–400)
RBC DISTRIBUTION WIDTH STD DEV: 44.6 fL (ref 35.1–46.3)
RED BLOOD CELL COUNT: 4.62 M/uL (ref 3.90–5.20)
WHITE BLOOD CELL COUNT: 8 10*3/uL (ref 4.0–11.0)

## 2023-05-06 LAB — RBC SEDIMENTATION RATE: RBC SEDIMENTATION RATE: 16 mm/h (ref 0–30)

## 2023-05-06 LAB — C-REACTIVE PROTEIN: C-REACTIVE PROTEIN: <3 mg/L (ref 0–18)
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
## 2023-05-06 NOTE — Progress Notes (Signed)
Sutures removed By A Leahey PA  I applied steri strips to the Rt knee    I provided the patient with the following DME  16 in Knee immobilizer applied,     The product I provided fits properly  The patient demonstrated they can properly apply the product  I have explained the products features and rationale  I encouraged patient to contact us with any questions or concerns

## 2023-05-06 NOTE — Progress Notes (Signed)
Chart review complete  OUD medication reviewed  BSAS documentation complete and utd  UDS orders updated    Pt was present during our online google group for suboxone.    Participated in the discussion.  She is struggling with finding out that she is going to have to have a third knee surgery.  Asked her to keep in touch as plans progress.  Doing otherwise well.  She increased her dose of suboxone on her own, and discussed this at the meeting.  We will be increasing her dose accordingly.

## 2023-05-06 NOTE — Progress Notes (Signed)
I called patient with lab results. Lab work done today is currently not concerning for PJI. No need for an aspiration.    We will plan to book surgery ASAP. Patient understands the plan. She will see [place] back for an H&P prior to surgery.    Myer Haff, PA-C, 05/06/2023

## 2023-05-06 NOTE — Progress Notes (Signed)
Collected blood sample by venipuncture on left arm successful on  1 attempt patient tolerated well.          Lavone Nian, [place], 05/06/2023

## 2023-05-07 ENCOUNTER — Telehealth (HOSPITAL_BASED_OUTPATIENT_CLINIC_OR_DEPARTMENT_OTHER): Payer: Self-pay | Admitting: Internal Medicine

## 2023-05-07 NOTE — Telephone Encounter (Signed)
-----   Message from [place] B sent at 05/07/2023  4:27 PM EST -----  Regarding: vna update  Angelita Pharris [id number], 67 year old, female    Calls today: Patient declining home visits - states all home visits are on old pending knee replacement revision     Person calling on behalf of patient: Juluis Rainier [facility]    [name] NUMBER: [phone]    Patient's language of care: English    Patient does not need an interpreter.    Patient's PCP: Hildred Laser, MD    Primary Care Home Site:  [facility]

## 2023-05-07 NOTE — BH OP Treatment Plan (Signed)
Patient/Guardian:  contributed to the creation of the treatment plan.    Strengths/Skills: Help seeking and motivated to get better     Potential Barriers: Multiple life stressors       Patient stated goals: Pt would like to prevent relapses and support her recovery process     Problem 1: Opioid Dependence on Agonist Therapy (Opioid Use Disorder on Agonist Therapy)      Short Term Goals: Pt will use at least one copying skill a day to reduce a risk of relapse, enhance coping with OUD, and support her recovery process     Short Term Target:  60 days  Short TermTarget Date: 07/04/2023  Short Term Goal #1 Progress: progressing     Long Term Goals: Pt will report 50% in urges and cravings reduction and no relapses or close calls       Long Term Target:  90 days  Long TermTarget Date:  08/03/2023   Long Term Goal #1 Progress:  progressing      Intervention #1: PCBHI-based OBOT/Group Psychotherapy   Intervention Frequency:  biweekly  Intervention Duration:  90 Days  Intervention Responsibility:  J. Maryann Conners, PhD and the OBOT team

## 2023-05-07 NOTE — Progress Notes (Signed)
OUTPATIENT PSYCHIATRY GROUP PROGRESS NOTE    For the patient participating in the group via telephone and/or video technology: The patient/guardian has verbally consented to participate in this group therapy session by televisit. The patient/guardian was encouraged to be in a private location due to personal health information being discussed and where they could not be overheard by individuals not participating in the group therapy. Although all [facility] staff/providers participating in this group therapy televisit are in a private location, all the risks of telephone and/or video technology used to conduct this group therapy session cannot be controlled by [place], (i.e. interruptions, unauthorized access/recording and technical difficulties).  Each patient understands that the visit can be stopped at any time for any reason, including if the conferencing connections are inadequate.    Cheryl Dixon is a 68 year old, Divorced, female, who presented for [facility] group visit.   Length of group: 60 min which included 30 min of Health Behavior Intervention and OUD Psychoeducation.    Group Name: OBOT: Office Based Opioid Treatment Group    Group Topic Today:  [employer] and New Goals. Group members reflected on their experiences during the holidays and shared how they navigated challenges or celebrated successes during that time. They revisited their participation and engagement with the group over the past year, discussing personal growth, insights gained, and how the group has supported their journey. The discussion included a check-in where members provided feedback on their experience with the group and highlighted its impact on their recovery and well-being. Members were encouraged to suggest topics they would like to explore in future sessions, ensuring the group remains relevant and responsive to their needs. The group session provided a safe, supportive environment to explore support strategies and foster  resilience.     Leaders:  Azzie Glatter, MD, Milta Deiters, Ph.D., and Boneta Lucks, RN    Service Type: [id number] Group Psychotherapy     Number of participants in group today:  12        Purpose of Group (choose all that apply):   Skills training/rehab  Support (psychological, family, community resources, recovery)  Insight and behavior change  Increasing health promoting behaviors  Relapse prevention and enhancing Recovery Process  Education about Opioid Use Disorder (OUD) and treatment  Enhancing adjustment and coping with OUD  Improving management of OUD and treatment adherence  Decreasing health risk behaviors and risks of relapse  Addressing psychological/psychosocial/behavioral barriers to preventing addiction and relapse        Group Process:    Review of group goals/structure  Education about Opioid Use Disorder (OUD) and related topics  Group discussion and sharing.    Review of strategies to increase healthy coping mechanisms to support recovery and change.  Review of additional relapse prevention strategies.   Discussion of support strategies  Check in    Individual Patient Participation:    Appeared engaged and attentive to process.  Pt shared her experience with managing post-surgery pain, discussing the use of pain medication and differences between brands. She reflected on the potential need for reevaluation of her treatment plan during her next surgery.  No relapse/close calls disclosed.       Medical Necessity of Session (how treatment is necessary to improve symptoms, functioning, or prevent worsening): treatment is necessary to enhance compliance with agonist treatment and maintain abstinence from opioids. Suboxone group participation is mandatory to continue Suboxone prescription and is necessary in order to enhance coping with Opioid Use Disorder.  Relevant Changes in Mental Status: No. Mood seems euthymic; no acute issues reported or noted      Risk Level per Scale:  Suicide: re-assessment  not indicated today  Violence: re-assessment not indicated today  Addiction: low     Current risk level represents increase in risk: No.    DIAGNOSES:     Opioid dependence on agonist therapy (HCC)  (primary encounter diagnosis)   SNOMED CT(R): OPIOID DEPENDENCE, ON AGONIST THERAPY     MDD (major depressive disorder), recurrent, in partial remission (HCC)   SNOMED CT(R): RECURRENT MAJOR DEPRESSION IN PARTIAL REMISSION      REVIEWING TODAY'S VISIT:   Reviewing Today's Visit:  Clinical Interventions Today: Motivational Interviewing; Cognitive Behavioral Therapy (CBT); Relapse Prevention for SUDs  Patient's Response to Interventions: Responsive  Time Spent in Psychotherapy: 60 min            CARE PLAN/ EPISODES:  Linked Episodes   Type: Episode: Status: Noted: Resolved: Last update: Updated by:   PCBHI PCBHI OBOT  Active 09/11/2020  05/07/2023 11:30 PM Milta Deiters, PhD      Comments:       Patient/Guardian:  contributed to the creation of the treatment plan.    Strengths/Skills: Help seeking and motivated to get better     Potential Barriers: Multiple life stressors       Patient stated goals: Pt would like to prevent relapses and support her recovery process     Problem 1: Opioid Dependence on Agonist Therapy (Opioid Use Disorder on Agonist Therapy)      Short Term Goals: Pt will use at least one copying skill a day to reduce a risk of relapse, enhance coping with OUD, and support her recovery process     Short Term Target:  60 days  Short TermTarget Date: 07/04/2023  Short Term Goal #1 Progress: progressing     Long Term Goals: Pt will report 50% in urges and cravings reduction and no relapses or close calls       Long Term Target:  90 days  Long TermTarget Date:  08/03/2023   Long Term Goal #1 Progress:  progressing      Intervention #1: PCBHI-based OBOT/Group Psychotherapy   Intervention Frequency:  biweekly  Intervention Duration:  90 Days  Intervention Responsibility:  J. Maryann Conners, PhD and the OBOT team  Milta Deiters, PhD

## 2023-05-07 NOTE — Telephone Encounter (Signed)
FYI message to PCP.      ----- Message from [place] B sent at 05/07/2023  4:27 PM EST -----  Regarding: vna update  Cheryl Dixon [id number], 68 year old, female    Calls today: Patient declining home visits - states all home visits are on old pending knee replacement revision     Person calling on behalf of patient: Juluis Rainier [facility]    [name] NUMBER: [phone]    Patient's language of care: English    Patient does not need an interpreter.    Patient's PCP: Hildred Laser, MD    Primary Care Home Site:  [facility]
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
## 2023-05-11 ENCOUNTER — Telehealth (HOSPITAL_BASED_OUTPATIENT_CLINIC_OR_DEPARTMENT_OTHER): Payer: Self-pay | Admitting: Registered Nurse

## 2023-05-11 NOTE — Telephone Encounter (Addendum)
Message  Received: Today  Myer Haff, PA-C  Donette Larry, RN  Caller: Unspecified (Today,  1:06 PM)  Not sure what she's referencing. We will need to see her back for an H&P anyways and we can discuss in person as she will need a nasal swab etc.    Looks like Sallee Lange started her on maybe tylenol and celebrex preop but otherwise not seeing anything                              I was asked to call pt regarding some medications    I called pt ,  pt is asking if  she has any pre-op medications she should be taking    Stating  the past two surgery's she has had    she had to take medications    Message sent to A Leahey PA   I will need a New  VNA referral    DOS 05/19/23  RT Patellar Tendon

## 2023-05-13 ENCOUNTER — Other Ambulatory Visit: Payer: Self-pay

## 2023-05-13 ENCOUNTER — Ambulatory Visit: Payer: No Typology Code available for payment source | Attending: Orthopaedic Surgery | Admitting: Orthopaedic Surgery

## 2023-05-13 VITALS — BP 170/96 | HR 71 | Temp 97.5°F | Resp 16 | Ht 63.0 in | Wt 196.0 lb

## 2023-05-13 DIAGNOSIS — S86811D Strain of other muscle(s) and tendon(s) at lower leg level, right leg, subsequent encounter: Secondary | ICD-10-CM | POA: Insufficient documentation

## 2023-05-13 DIAGNOSIS — Z96651 Presence of right artificial knee joint: Secondary | ICD-10-CM | POA: Insufficient documentation

## 2023-05-13 DIAGNOSIS — Z87828 Personal history of other (healed) physical injury and trauma: Secondary | ICD-10-CM | POA: Insufficient documentation

## 2023-05-13 DIAGNOSIS — Z01818 Encounter for other preprocedural examination: Secondary | ICD-10-CM | POA: Insufficient documentation
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
## 2023-05-13 NOTE — Progress Notes (Signed)
2025 Questionnaire THA/TKA will be scanned into chart
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
## 2023-05-14 ENCOUNTER — Other Ambulatory Visit: Payer: Self-pay

## 2023-05-14 ENCOUNTER — Other Ambulatory Visit (HOSPITAL_BASED_OUTPATIENT_CLINIC_OR_DEPARTMENT_OTHER): Payer: Self-pay | Admitting: Internal Medicine

## 2023-05-14 ENCOUNTER — Encounter (HOSPITAL_BASED_OUTPATIENT_CLINIC_OR_DEPARTMENT_OTHER): Payer: Self-pay

## 2023-05-14 ENCOUNTER — Ambulatory Visit
Admission: RE | Admit: 2023-05-14 | Discharge: 2023-05-14 | Disposition: A | Discharge status: Home / Self Care | Payer: No Typology Code available for payment source | Source: Ambulatory Visit

## 2023-05-14 DIAGNOSIS — K5903 Drug induced constipation: Secondary | ICD-10-CM

## 2023-05-14 HISTORY — DX: Cerebral infarction, unspecified: I63.9

## 2023-05-14 LAB — MRSA/MSSA NASAL (PCR): MRSA/MSSA NASAL (PCR): NEGATIVE

## 2023-05-14 MED ORDER — POLYETHYLENE GLYCOL 3350 17 G PO PACK
17.0000 g | PACK | Freq: Every day | ORAL | 3 refills | Status: AC
Start: 2023-05-14 — End: 2024-05-08

## 2023-05-14 NOTE — Surgery Pre-Op (Addendum)
PAT Visit          Cheryl Dixon is a 68 year old female received a preoperative screening.        Appointments for Next 30 Days 05/14/2023 - 06/13/2023        Date Visit Type Department Provider     05/14/2023  2:05 PM PRE-ADMISSION SCREENING CALL [facility] - [facility] - Pretesting  Arrive at: [facility] PHONE SCREEN ARRIVAL LOCATION [facility] RN1    Patient Instructions:     On this date, you will receive a phone call from a [facility] nurse to discuss your upcoming procedure. You do not need to come to the hospital for this appointment                 06/02/2023 10:30 AM TELEVISIT GROUP [facility] - [facility] Azzie Glatter, MD    Patient Instructions:     This group will take place by video or in some cases by phone. You will receive an email with instructions on how to join this visit by Google Meet prior to the appointment.               06/10/2023 11:15 AM ORTHO POST OP 15 [facility] - [facility] - Orthopedics [name], MD    Patient Instructions:                       Surgery Information       Future Procedures (Tomorrow to 05/13/2024)         Date Time Visit Type/Procedure Providers Loc / Dept Status        05/19/2023 1310 REVISION, TOTAL ARTHROPLASTY, KNEE, ALL COMPONENTS - RightREPAIR, TENDON, PATELLAR - Right [name] [facility] OR [facility]      Visit Type/Procedure: REVISION, TOTAL ARTHROPLASTY, KNEE, ALL COMPONENTS - RightREPAIR, TENDON, PATELLAR - Right                                    .     Language of care:English    Allergy History  Darvon, Meperidine Hcl, Paxil [Paroxetine], and Zoloft [Sertraline Hcl]      Past Medical History:  No date: Anxiety  No date: Arthritis  No date: COPD (chronic obstructive pulmonary disease) (HCC)  No date: Depression  No date: Obese  No date: Substance addiction (HCC)  No date: Varicose veins of lower extremities     has a past surgical history that includes ANES HRNA REPAIR UPR ABD TABDL RPR DIPHRG HRNA; ENDOMETRIAL ABLTJ  THERMAL W/O HYSTEROSCOPIC GID; ANES IPR LOWER ABD W/LAPS RAD HYSTERECTOMY; RPR 1ST INGUN HRNA PRETERM INFT [facility]; and Colonoscopy (2023).         Alcohol, Tobacco and Drug History:  Social History    Tobacco Use      Smoking status: Former        Packs/day: 0.00        Years: 1 pack/day for 32.0 years (32.0 ttl pk-yrs)        Types: Cigarettes        Start date: 09/26/1989        Quit date: 09/26/2021        Years since quitting: 1.6        Passive exposure: Never      Smokeless tobacco: Never      Tobacco  comments: quit 1980-1996, then light until 2003, then 1 ppd since    E-Cigarette/Vaping  E-Cigarette Use: Never User  Quit Date:   Nicotine:   Start Date:   THC:    Cartridges/Day:   CBD:   Other Substance:   Flavoring:       Alcohol use   No       Drug use:         Types:   Marijuana      Comment:   marijuana- last dose sunday 03/08/23 past opioids, nasal heroin, 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  MJ 1x per week          Extended Emergency Contact Information  Primary Emergency Contact: Suder,STACY  Address: [address]           [place], [place] [address] [place]  Home Phone: [phone]  Relation: Daughter        Current Outpatient Medications   Medication Sig    albuterol HFA 108 (90 Base) MCG/ACT inhaler Inhale 2 puffs into the lungs every 6 (six) hours as needed for Wheezing    acetaminophen (TYLENOL) 500 MG tablet Take 500 mg by mouth every 6 (six) hours as needed for Pain    buprenorphine-naloxone (SUBOXONE) 8-2 MG sublingual film Take 1.5 films under the tongue in the morning, 1 film midday, and 1 film at night    docusate sodium (COLACE) 100 MG capsule TAKE 1 CAPSULE BY MOUTH IN THE MORNING AND BEFORE BEDTIME    ASPIRIN LOW DOSE 81 MG chewable tablet TAKE 1 TABLET BY MOUTH EVERY DAY IN THE MORNING    estrogen, conjugated, (PREMARIN) 0.625 MG/GM vaginal cream Place vaginally daily    busPIRone (BUSPAR) 5 MG tablet Take 5 mg by mouth in the morning and 5 mg before bedtime. (Patient not taking: Reported on 05/13/2023)     solifenacin (VESICARE) 10 MG tablet TAKE 1 TABLET BY MOUTH EVERY DAY IN THE MORNING    hydrocortisone 2.5 % cream Apply topically 2 (two) times daily To affected areas of scalp and ears    famotidine (PEPCID) 20 MG tablet TAKE 1 TABLET BY MOUTH IN THE MORNING AND BEFORE BEDTIME    tiotropium-olodaterol (STIOLTO RESPIMAT) 2.5-2.5 MCG/ACT inhal Inhale 2 puffs into the lungs daily    buPROPion (WELLBUTRIN SR) 200 MG 12 hr tablet TAKE 1 TABLET BY MOUTH IN THE MORNING AND BEFORE BEDTIME    cetirizine (ZYRTEC) 10 MG tablet TAKE 1 TABLET BY MOUTH EVERY DAY IN THE MORNING (Patient not taking: Reported on 05/13/2023)    VITAMIN D3 50 MCG (2000 UT) TABS tablet TAKE 1 TABLET BY MOUTH EVERY DAY IN THE MORNING    atorvastatin (LIPITOR) 80 MG tablet Take 1 tablet by mouth Daily after dinner    polyethylene glycol (GLYCOLAX/MIRALAX) 17 g packet Take 1 packet by mouth in the morning.     No current facility-administered medications for this encounter.         (Not in a hospital admission)      PAT Assessment                       There were no vitals filed for this visit.        Patient issues currently are none.  Medication instructions were given. AVS given to patient.   CHX wipes instructions were given and reviewed in office and again today.   All questions were answered.   The patient expressed understanding of pre op  instructions  The patient will be admitted overnight  Cameron Proud, RN

## 2023-05-14 NOTE — Discharge Instructions (Addendum)
SURGICAL DAY CARE PRE-OPERATIVE INSTRUCTIONS    DAY OF SURGERY    Arrive at: [facility] on Tuesday, January  21 @11 :15     You will be admitted overnight in the hospital your doctor will determine  your discharge    You MUST have a responsible adult available to accompany you home after surgery.  You cannot take a taxi, [place] or Lyft home alone unless accompanied by another adult.  (We suggest you that you have someone available to assist you at home after your surgery)     INSTRUCTIONS:     You may have nothing to eat after midnight the night before your surgery.    Please drink 32 oz (1 L) of electrolyte rich fluid in the 12 hours leading up to surgery, to end by 2 hours before the start time of surgery (in addition to the ERAS protocol).  You may drink clear liquids up until two hours before your arrival time.  Examples of clear liquids are:  water  electrolyte drinks such as Gatorade, Gatorade Zero, Pedialyte, or Vitamin water  one cup of Black coffee or tea with NO CREAM OR MILK   You can drink ensure drink on your way into hospital.   Do not smoke or vape the morning of your surgery     Please leave all valuables at home, including jewelry, watches, money, etc.    Please remove all fingernail polish and do not wear any makeup     If you are having eye surgery, do not wear any eye or face makeup and avoid facial moisturizers and perfumes.    Please take out any hair extensions that can be removed.    Do not shave your surgical site.    Do not wear contact lenses.    MEDICATIONS:     Take the following medication the night before surgery at bedtime: usual evening medications    Take the following medication the morning of your surgery with only a sip of water: asa, atorvastatin Suboxone Wellbutrin    Chg wipes

## 2023-05-14 NOTE — Telephone Encounter (Signed)
PER Patient (self), Cheryl Dixon is a 68 year old female has requested a refill of Miralax.    **PATIENT ALSO SPOKE TO PROVIDER ABOUT SWITCHING ESTROGEN CREAM TO TABLET. NOTHING SENT IN YET. PLEASE REVIEW**    Last Office Visit: 05/13/2023    Other Med Adult:  Most Recent BP Reading(s)  05/13/23 : (!) 170/96        Cholesterol (mg/dL)   Date Value   16/01/9603 212     LOW DENSITY LIPOPROTEIN DIRECT (mg/dL)   Date Value   54/12/8117 151     HIGH DENSITY LIPOPROTEIN (mg/dL)   Date Value   14/78/2956 47     TRIGLYCERIDES (mg/dL)   Date Value   21/30/8657 119         THYROID SCREEN TSH REFLEX FT4 (uIU/mL)   Date Value   08/20/2022 4.100         No results found for: "TSH"    HEMOGLOBIN A1C (%)   Date Value   08/20/2022 5.6       No results found for: "POCA1C"      INR (no units)   Date Value   12/12/2021 1.1   02/16/2007 1.0 (L)   07/03/2006 < 1.0 (L)       SODIUM (mmol/L)   Date Value   03/30/2023 137       POTASSIUM (mmol/L)   Date Value   03/30/2023 4.4           CREATININE (mg/dL)   Date Value   84/69/6295 0.8       Documented patient preferred pharmacies:    [id number] - [place], [place] - [address]  Phone: [phone] Fax: [phone]

## 2023-05-19 ENCOUNTER — Observation Stay
Admission: AD | Admit: 2023-05-19 | Discharge: 2023-05-21 | Disposition: A | Discharge status: Home / Self Care | Payer: No Typology Code available for payment source | Source: Ambulatory Visit | Attending: Orthopaedic Surgery | Admitting: Orthopaedic Surgery

## 2023-05-19 ENCOUNTER — Encounter (HOSPITAL_BASED_OUTPATIENT_CLINIC_OR_DEPARTMENT_OTHER): Payer: Self-pay | Admitting: Orthopaedic Surgery

## 2023-05-19 ENCOUNTER — Encounter (HOSPITAL_BASED_OUTPATIENT_CLINIC_OR_DEPARTMENT_OTHER)
Admission: AD | Disposition: A | Discharge status: Home / Self Care | Payer: Self-pay | Source: Ambulatory Visit | Attending: Orthopaedic Surgery

## 2023-05-19 ENCOUNTER — Telehealth (HOSPITAL_BASED_OUTPATIENT_CLINIC_OR_DEPARTMENT_OTHER): Payer: Self-pay | Admitting: Internal Medicine

## 2023-05-19 ENCOUNTER — Inpatient Hospital Stay (HOSPITAL_BASED_OUTPATIENT_CLINIC_OR_DEPARTMENT_OTHER): Payer: No Typology Code available for payment source | Admitting: Certified Registered"

## 2023-05-19 ENCOUNTER — Telehealth (HOSPITAL_BASED_OUTPATIENT_CLINIC_OR_DEPARTMENT_OTHER): Payer: Self-pay

## 2023-05-19 ENCOUNTER — Other Ambulatory Visit: Payer: Self-pay

## 2023-05-19 ENCOUNTER — Observation Stay (HOSPITAL_BASED_OUTPATIENT_CLINIC_OR_DEPARTMENT_OTHER): Payer: No Typology Code available for payment source

## 2023-05-19 DIAGNOSIS — Z87891 Personal history of nicotine dependence: Secondary | ICD-10-CM

## 2023-05-19 DIAGNOSIS — Z471 Aftercare following joint replacement surgery: Secondary | ICD-10-CM | POA: Diagnosis not present

## 2023-05-19 DIAGNOSIS — Z8673 Personal history of transient ischemic attack (TIA), and cerebral infarction without residual deficits: Secondary | ICD-10-CM | POA: Diagnosis not present

## 2023-05-19 DIAGNOSIS — G8918 Other acute postprocedural pain: Secondary | ICD-10-CM | POA: Diagnosis present

## 2023-05-19 DIAGNOSIS — Z96651 Presence of right artificial knee joint: Secondary | ICD-10-CM

## 2023-05-19 DIAGNOSIS — X58XXXD Exposure to other specified factors, subsequent encounter: Secondary | ICD-10-CM | POA: Diagnosis not present

## 2023-05-19 DIAGNOSIS — S86811A Strain of other muscle(s) and tendon(s) at lower leg level, right leg, initial encounter: Secondary | ICD-10-CM | POA: Diagnosis not present

## 2023-05-19 DIAGNOSIS — J449 Chronic obstructive pulmonary disease, unspecified: Secondary | ICD-10-CM | POA: Diagnosis present

## 2023-05-19 DIAGNOSIS — F1120 Opioid dependence, uncomplicated: Secondary | ICD-10-CM | POA: Diagnosis present

## 2023-05-19 DIAGNOSIS — S86811S Strain of other muscle(s) and tendon(s) at lower leg level, right leg, sequela: Secondary | ICD-10-CM | POA: Diagnosis present

## 2023-05-19 DIAGNOSIS — S86811D Strain of other muscle(s) and tendon(s) at lower leg level, right leg, subsequent encounter: Principal | ICD-10-CM | POA: Insufficient documentation

## 2023-05-19 DIAGNOSIS — T84092A Other mechanical complication of internal right knee prosthesis, initial encounter: Secondary | ICD-10-CM | POA: Diagnosis not present

## 2023-05-19 LAB — TYPE AND SCREEN
ABO/RH INTERPRETATION: A POS
ANTIBODY SCREEN SOLID PHASE: NEGATIVE

## 2023-05-19 SURGERY — REVISION, TOTAL ARTHROPLASTY, KNEE, ALL COMPONENTS
Anesthesia: General | Site: Knee | Laterality: Right | Wound class: Class I/ Clean

## 2023-05-19 MED ORDER — LACTATED RINGERS IV BOLUS
INTRAVENOUS | Status: DC | PRN
Start: 2023-05-19 — End: 2023-05-20

## 2023-05-19 MED ORDER — HYDROMORPHONE HCL 2 MG/ML IJ SOLN (SUPER ERX)
Status: AC
Start: 2023-05-19 — End: 2023-05-19
  Filled 2023-05-19: qty 1

## 2023-05-19 MED ORDER — BUPRENORPHINE HCL-NALOXONE HCL 8-2 MG SL SUBL
8.0000 mg | SUBLINGUAL_TABLET | Freq: Every day | SUBLINGUAL | Status: DC
Start: 2023-05-20 — End: 2023-05-21
  Administered 2023-05-20 – 2023-05-21 (×3): 1 via SUBLINGUAL
  Filled 2023-05-19 (×2): qty 1

## 2023-05-19 MED ORDER — ROCURONIUM BROMIDE 100 MG/10ML IV SOLN
Freq: Once | INTRAVENOUS | Status: DC | PRN
Start: 2023-05-19 — End: 2023-05-20
  Administered 2023-05-19: 100 mg via INTRAVENOUS
  Administered 2023-05-19: 30 mg via INTRAVENOUS

## 2023-05-19 MED ORDER — OXYCODONE-ACETAMINOPHEN 5-325 MG PO TABS
1.0000 | ORAL_TABLET | ORAL | Status: DC | PRN
Start: 2023-05-19 — End: 2023-05-19

## 2023-05-19 MED ORDER — HYDROMORPHONE HCL 1 MG/ML IJ SOLN (SUPER ERX)
1.0000 mg | Status: DC | PRN
Start: 2023-05-19 — End: 2023-05-19
  Administered 2023-05-19: 1 mg via INTRAVENOUS
  Filled 2023-05-19: qty 1

## 2023-05-19 MED ORDER — ROPIV-EPI-CLONIDINE-KETOROLAC 123-0.25-0.04- 15 MG/50ML PA SOSY
50.0000 mL | PREFILLED_SYRINGE | Freq: Once | PERIARTICULAR | Status: AC
Start: 2023-05-19 — End: 2023-05-20
  Administered 2023-05-19 – 2023-05-20 (×2): 50 mL
  Filled 2023-05-19: qty 50

## 2023-05-19 MED ORDER — LABETALOL HCL 5 MG/ML IV SOLN
Freq: Once | INTRAVENOUS | Status: DC | PRN
Start: 2023-05-19 — End: 2023-05-20
  Administered 2023-05-19: 10 mg via INTRAVENOUS

## 2023-05-19 MED ORDER — PROPOFOL 200 MG/20ML IV EMUL
INTRAVENOUS | Status: AC
Start: 2023-05-19 — End: 2023-05-19
  Filled 2023-05-19: qty 20

## 2023-05-19 MED ORDER — MULTIVITAMIN PO TAB
1.0000 | ORAL_TABLET | Freq: Every day | ORAL | Status: DC
Start: 2023-05-20 — End: 2023-05-21
  Administered 2023-05-20 – 2023-05-21 (×2): 1 via ORAL
  Filled 2023-05-19 (×2): qty 1

## 2023-05-19 MED ORDER — KETAMINE HCL 10 MG/ML IJ SOLN
INTRAMUSCULAR | Status: AC
Start: 2023-05-19 — End: 2023-05-19
  Filled 2023-05-19: qty 20

## 2023-05-19 MED ORDER — DIPHENHYDRAMINE HCL 25 MG PO CAPS
25.0000 mg | ORAL_CAPSULE | Freq: Every evening | ORAL | Status: DC | PRN
Start: 2023-05-19 — End: 2023-05-21
  Administered 2023-05-20 (×2): 25 mg via ORAL
  Filled 2023-05-19 (×2): qty 1

## 2023-05-19 MED ORDER — EPHEDRINE SULFATE 25 MG/5 ML-% IV SOSY (SUPER ERX)
PREFILLED_SYRINGE | Freq: Once | Status: DC | PRN
Start: 2023-05-19 — End: 2023-05-20
  Administered 2023-05-19 (×3): 10 mg via INTRAVENOUS
  Administered 2023-05-19: 5 mg via INTRAVENOUS
  Administered 2023-05-19: 10 mg via INTRAVENOUS

## 2023-05-19 MED ORDER — NON FORMULARY
2.0000 | Freq: Every day | Status: DC
Start: 2023-05-20 — End: 2023-05-20

## 2023-05-19 MED ORDER — LIDOCAINE HCL (PF) 2 % IJ SOLN
INTRAMUSCULAR | Status: AC
Start: 2023-05-19 — End: 2023-05-19
  Filled 2023-05-19: qty 5

## 2023-05-19 MED ORDER — ONDANSETRON HCL 4 MG/2ML IJ SOLN
INTRAMUSCULAR | Status: AC
Start: 2023-05-19 — End: 2023-05-19
  Filled 2023-05-19: qty 2

## 2023-05-19 MED ORDER — HYDROMORPHONE HCL 2 MG PO TABS
2.0000 mg | ORAL_TABLET | ORAL | Status: DC | PRN
Start: 2023-05-19 — End: 2023-05-21

## 2023-05-19 MED ORDER — ATORVASTATIN CALCIUM 80 MG PO TABS
80.0000 mg | ORAL_TABLET | Freq: Every day | ORAL | Status: DC
Start: 2023-05-20 — End: 2023-05-21
  Administered 2023-05-20: 80 mg via ORAL
  Filled 2023-05-19: qty 1

## 2023-05-19 MED ORDER — CELECOXIB 100 MG PO CAPS
200.0000 mg | ORAL_CAPSULE | Freq: Once | ORAL | Status: AC
Start: 2023-05-19 — End: 2023-05-19
  Administered 2023-05-19: 200 mg via ORAL
  Filled 2023-05-19: qty 2

## 2023-05-19 MED ORDER — LABETALOL HCL 5 MG/ML IV SOLN
INTRAVENOUS | Status: AC
Start: 2023-05-19 — End: 2023-05-19
  Filled 2023-05-19: qty 20

## 2023-05-19 MED ORDER — CEFAZOLIN SODIUM 1 G IJ SOLR
INTRAMUSCULAR | Status: AC
Start: 2023-05-19 — End: 2023-05-19
  Filled 2023-05-19: qty 10

## 2023-05-19 MED ORDER — ACETAMINOPHEN 500 MG PO TABS
1000.0000 mg | ORAL_TABLET | Freq: Once | ORAL | Status: AC
Start: 2023-05-19 — End: 2023-05-19
  Administered 2023-05-19: 1000 mg via ORAL
  Filled 2023-05-19: qty 2

## 2023-05-19 MED ORDER — KETOROLAC TROMETHAMINE 15 MG/ML IJ SOLN
15.0000 mg | Freq: Three times a day (TID) | INTRAMUSCULAR | Status: DC
Start: 2023-05-19 — End: 2023-05-21
  Administered 2023-05-19 – 2023-05-21 (×5): 15 mg via INTRAVENOUS
  Filled 2023-05-19 (×5): qty 1

## 2023-05-19 MED ORDER — KETAMINE HCL 10 MG/ML IJ SOLN
Freq: Once | INTRAMUSCULAR | Status: DC | PRN
Start: 2023-05-19 — End: 2023-05-20
  Administered 2023-05-19: 50 mg via INTRAVENOUS
  Administered 2023-05-19: 30 mg via INTRAVENOUS
  Administered 2023-05-19: 40 mg via INTRAVENOUS
  Administered 2023-05-19 (×2): 25 mg via INTRAVENOUS
  Administered 2023-05-19: 30 mg via INTRAVENOUS

## 2023-05-19 MED ORDER — ROCURONIUM BROMIDE 100 MG/10ML IV SOLN
INTRAVENOUS | Status: AC
Start: 2023-05-19 — End: 2023-05-19
  Filled 2023-05-19: qty 10

## 2023-05-19 MED ORDER — BISACODYL 10 MG PR SUPP
10.0000 mg | Freq: Every day | RECTAL | Status: DC | PRN
Start: 2023-05-19 — End: 2023-05-21

## 2023-05-19 MED ORDER — DEXAMETHASONE SODIUM PHOSPHATE 4 MG/ML IJ SOLN
Freq: Once | INTRAMUSCULAR | Status: DC | PRN
Start: 2023-05-19 — End: 2023-05-20
  Administered 2023-05-19: 8 mg via INTRAVENOUS

## 2023-05-19 MED ORDER — TOBRAMYCIN FOR BONE CEMENT
1.2000 g | Freq: Once | Status: AC
Start: 2023-05-19 — End: 2023-05-19
  Administered 2023-05-19: 1.2 g
  Filled 2023-05-19: qty 1.2

## 2023-05-19 MED ORDER — SODIUM CHLORIDE 0.9 % MINI-BAG PLUS
2.0000 g | Freq: Three times a day (TID) | INTRAVENOUS | Status: DC
Start: 2023-05-20 — End: 2023-05-21
  Administered 2023-05-20 – 2023-05-21 (×4): 2 g via INTRAVENOUS
  Filled 2023-05-19 (×4): qty 20

## 2023-05-19 MED ORDER — DEXAMETHASONE SODIUM PHOSPHATE 4 MG/ML IJ SOLN
INTRAMUSCULAR | Status: AC
Start: 2023-05-19 — End: 2023-05-19
  Filled 2023-05-19: qty 2

## 2023-05-19 MED ORDER — PHENYLEPHRINE HCL-NACL 0.4-0.9 MG/10ML-% IV SOSY
PREFILLED_SYRINGE | Freq: Once | INTRAVENOUS | Status: DC | PRN
Start: 2023-05-19 — End: 2023-05-20
  Administered 2023-05-19: 60 ug via INTRAVENOUS

## 2023-05-19 MED ORDER — PANTOPRAZOLE SODIUM 40 MG PO TBEC
40.0000 mg | DELAYED_RELEASE_TABLET | Freq: Every day | ORAL | Status: DC
Start: 2023-05-20 — End: 2023-05-21
  Administered 2023-05-20 – 2023-05-21 (×2): 40 mg via ORAL
  Filled 2023-05-19 (×2): qty 1

## 2023-05-19 MED ORDER — DIPHENHYDRAMINE HCL 50 MG/ML IJ SOLN
25.0000 mg | INTRAMUSCULAR | Status: AC | PRN
Start: 2023-05-19 — End: 2023-05-19
  Administered 2023-05-19 (×2): 12.5 mg via INTRAVENOUS
  Filled 2023-05-19: qty 1

## 2023-05-19 MED ORDER — CELECOXIB 100 MG PO CAPS
200.0000 mg | ORAL_CAPSULE | Freq: Every day | ORAL | Status: DC
Start: 2023-05-21 — End: 2023-05-21

## 2023-05-19 MED ORDER — TRANEXAMIC ACID-NACL 1000-0.7 MG/100ML-% IV SOLN
1000.0000 mg | Freq: Once | INTRAVENOUS | Status: DC
Start: 2023-05-19 — End: 2023-05-20
  Administered 2023-05-20: 1000 mg via INTRAVENOUS
  Filled 2023-05-19: qty 100

## 2023-05-19 MED ORDER — MIDAZOLAM HCL 2 MG/2 ML IJ SOLN
0.5000 mg | INTRAMUSCULAR | Status: AC | PRN
Start: 2023-05-19 — End: 2023-05-19
  Administered 2023-05-19: 0.5 mg via INTRAVENOUS
  Administered 2023-05-19: 2 mg via INTRAVENOUS
  Administered 2023-05-19 (×2): 0.5 mg via INTRAVENOUS

## 2023-05-19 MED ORDER — DOCUSATE SODIUM 100 MG PO CAPS
100.0000 mg | ORAL_CAPSULE | Freq: Three times a day (TID) | ORAL | Status: DC
Start: 2023-05-20 — End: 2023-05-21
  Administered 2023-05-20 – 2023-05-21 (×5): 100 mg via ORAL
  Filled 2023-05-19 (×5): qty 1

## 2023-05-19 MED ORDER — SODIUM CHLORIDE 0.9 % IV BOLUS
INTRAVENOUS | Status: DC | PRN
Start: 2023-05-19 — End: 2023-05-20

## 2023-05-19 MED ORDER — CEFAZOLIN SODIUM 1 G IJ SOLR
Freq: Once | INTRAMUSCULAR | Status: DC | PRN
Start: 2023-05-19 — End: 2023-05-20
  Administered 2023-05-19: 2 g via INTRAVENOUS
  Administered 2023-05-19: 1 g via INTRAVENOUS

## 2023-05-19 MED ORDER — HYDROMORPHONE HCL 2 MG PO TABS
4.0000 mg | ORAL_TABLET | ORAL | Status: DC | PRN
Start: 2023-05-19 — End: 2023-05-21
  Administered 2023-05-20 – 2023-05-21 (×3): 4 mg via ORAL
  Filled 2023-05-19 (×3): qty 2

## 2023-05-19 MED ORDER — ALBUTEROL SULFATE HFA 108 (90 BASE) MCG/ACT IN AERS
INHALATION_SPRAY | Freq: Once | RESPIRATORY_TRACT | Status: DC | PRN
Start: 2023-05-19 — End: 2023-05-20
  Administered 2023-05-19: 3 via RESPIRATORY_TRACT

## 2023-05-19 MED ORDER — TRANEXAMIC ACID-NACL 1000-0.7 MG/100ML-% IV SOLN
1000.0000 mg | Freq: Once | INTRAVENOUS | Status: AC
Start: 2023-05-19 — End: 2023-05-19
  Administered 2023-05-19 (×2): 1000 mg via INTRAVENOUS
  Filled 2023-05-19: qty 100

## 2023-05-19 MED ORDER — BUPRENORPHINE HCL-NALOXONE HCL 8-2 MG SL SUBL
12.0000 mg | SUBLINGUAL_TABLET | Freq: Every morning | SUBLINGUAL | Status: DC
Start: 2023-05-20 — End: 2023-05-21
  Administered 2023-05-20 – 2023-05-21 (×2): 1.5 via SUBLINGUAL
  Filled 2023-05-19 (×2): qty 2

## 2023-05-19 MED ORDER — HYDROMORPHONE HCL 2 MG/ML IJ SOLN (SUPER ERX)
Freq: Once | Status: DC | PRN
Start: 2023-05-19 — End: 2023-05-20
  Administered 2023-05-19 (×2): .6 mg via INTRAVENOUS
  Administered 2023-05-19 (×2): .4 mg via INTRAVENOUS
  Administered 2023-05-19: .6 mg via INTRAVENOUS
  Administered 2023-05-19 (×2): .4 mg via INTRAVENOUS
  Administered 2023-05-19: .6 mg via INTRAVENOUS

## 2023-05-19 MED ORDER — ALBUTEROL SULFATE HFA 108 (90 BASE) MCG/ACT IN AERS
2.0000 | INHALATION_SPRAY | Freq: Four times a day (QID) | RESPIRATORY_TRACT | Status: DC | PRN
Start: 2023-05-19 — End: 2023-05-21
  Filled 2023-05-19: qty 6.7

## 2023-05-19 MED ORDER — SODIUM CHLORIDE 0.9 % MINI-BAG PLUS
2.0000 g | Freq: Once | INTRAVENOUS | Status: DC
Start: 2023-05-19 — End: 2023-05-20
  Administered 2023-05-20: 2 g via INTRAVENOUS
  Filled 2023-05-19: qty 20

## 2023-05-19 MED ORDER — SEVOFLURANE IN SOLN
RESPIRATORY_TRACT | Status: AC
Start: 2023-05-19 — End: 2023-05-19
  Filled 2023-05-19: qty 250

## 2023-05-19 MED ORDER — CEFAZOLIN SODIUM 1 G IJ SOLR
INTRAMUSCULAR | Status: AC
Start: 2023-05-19 — End: 2023-05-19
  Filled 2023-05-19: qty 20

## 2023-05-19 MED ORDER — FENTANYL CITRATE 0.05 MG/ML IJ SOLN
25.0000 ug | INTRAMUSCULAR | Status: AC | PRN
Start: 2023-05-19 — End: 2023-05-19
  Administered 2023-05-19: 50 ug via INTRAVENOUS
  Administered 2023-05-19: 25 ug via INTRAVENOUS
  Administered 2023-05-19: 50 ug via INTRAVENOUS
  Administered 2023-05-19: 25 ug via INTRAVENOUS
  Filled 2023-05-19: qty 2

## 2023-05-19 MED ORDER — FENTANYL CITRATE 0.05 MG/ML IJ SOLN
INTRAMUSCULAR | Status: AC
Start: 2023-05-19 — End: 2023-05-19
  Filled 2023-05-19: qty 2

## 2023-05-19 MED ORDER — NON FORMULARY
10.0000 mg | Freq: Every morning | Status: DC
Start: 2023-05-20 — End: 2023-05-20

## 2023-05-19 MED ORDER — FENTANYL CITRATE 0.05 MG/ML IJ SOLN
50.0000 ug | INTRAMUSCULAR | Status: DC | PRN
Start: 2023-05-19 — End: 2023-05-19
  Administered 2023-05-19 (×3): 50 ug via INTRAVENOUS
  Filled 2023-05-19: qty 2

## 2023-05-19 MED ORDER — BUPRENORPHINE HCL-NALOXONE HCL 8-2 MG SL SUBL
8.0000 mg | SUBLINGUAL_TABLET | Freq: Every evening | SUBLINGUAL | Status: DC
Start: 2023-05-19 — End: 2023-05-21
  Administered 2023-05-19 – 2023-05-20 (×2): 1 via SUBLINGUAL
  Filled 2023-05-19 (×2): qty 1

## 2023-05-19 MED ORDER — ESMOLOL HCL 100 MG/10ML IV SOLN
INTRAVENOUS | Status: AC
Start: 2023-05-19 — End: 2023-05-19
  Filled 2023-05-19: qty 10

## 2023-05-19 MED ORDER — EPHEDRINE SULFATE 25 MG/5 ML-% IV SOSY (SUPER ERX)
PREFILLED_SYRINGE | Status: AC
Start: 2023-05-19 — End: 2023-05-19
  Filled 2023-05-19: qty 5

## 2023-05-19 MED ORDER — METHOCARBAMOL 500 MG PO TABS
1000.0000 mg | ORAL_TABLET | Freq: Once | ORAL | Status: AC
Start: 2023-05-19 — End: 2023-05-19
  Administered 2023-05-19: 1000 mg via ORAL
  Filled 2023-05-19: qty 2

## 2023-05-19 MED ORDER — SUGAMMADEX SODIUM 200 MG/2ML IV SOLN
Freq: Once | INTRAVENOUS | Status: DC | PRN
Start: 2023-05-19 — End: 2023-05-20
  Administered 2023-05-19: 200 mg via INTRAVENOUS

## 2023-05-19 MED ORDER — GATORADE REGULAR
Freq: Four times a day (QID) | Status: AC
Start: 2023-05-19 — End: 2023-05-20

## 2023-05-19 MED ORDER — ONDANSETRON HCL 4 MG/2ML IJ SOLN
4.0000 mg | Freq: Once | INTRAMUSCULAR | Status: AC | PRN
Start: 2023-05-19 — End: 2023-05-19
  Administered 2023-05-19: 4 mg via INTRAVENOUS

## 2023-05-19 MED ORDER — SUGAMMADEX SODIUM 200 MG/2ML IV SOLN
INTRAVENOUS | Status: AC
Start: 2023-05-19 — End: 2023-05-19
  Filled 2023-05-19: qty 2

## 2023-05-19 MED ORDER — LIDOCAINE HCL (PF) 2 % IJ SOLN
Freq: Once | INTRAMUSCULAR | Status: DC | PRN
Start: 2023-05-19 — End: 2023-05-20
  Administered 2023-05-19: 100 mg via INTRAVENOUS

## 2023-05-19 MED ORDER — ASPIRIN 81 MG PO CHEW
81.0000 mg | CHEWABLE_TABLET | Freq: Two times a day (BID) | ORAL | Status: DC
Start: 2023-05-20 — End: 2023-05-21
  Administered 2023-05-20 – 2023-05-21 (×3): 81 mg via ORAL
  Filled 2023-05-19 (×3): qty 1

## 2023-05-19 MED ORDER — MORPHINE SULFATE 2 MG/ML IV SOLN (SUPER ERX)
2.0000 mg | Status: AC | PRN
Start: 2023-05-19 — End: 2023-05-20
  Administered 2023-05-20 (×3): 2 mg via INTRAVENOUS
  Filled 2023-05-19 (×3): qty 1

## 2023-05-19 MED ORDER — PROPOFOL 200 MG/20ML IV EMUL
Freq: Once | INTRAVENOUS | Status: DC | PRN
Start: 2023-05-19 — End: 2023-05-20
  Administered 2023-05-19: 5 mg via INTRAVENOUS
  Administered 2023-05-19 (×3): 50 mg via INTRAVENOUS
  Administered 2023-05-19: 200 mg via INTRAVENOUS
  Administered 2023-05-19 (×2): 50 mg via INTRAVENOUS

## 2023-05-19 MED ORDER — PROPOFOL 500 MG/50 ML IV
INTRAVENOUS | Status: DC | PRN
Start: 2023-05-19 — End: 2023-05-20
  Administered 2023-05-19: 30 ug/kg/min via INTRAVENOUS

## 2023-05-19 MED ORDER — ALBUTEROL SULFATE HFA 108 (90 BASE) MCG/ACT IN AERS
INHALATION_SPRAY | RESPIRATORY_TRACT | Status: AC
Start: 2023-05-19 — End: 2023-05-19
  Filled 2023-05-19: qty 6.7

## 2023-05-19 MED ORDER — MIDAZOLAM HCL 2 MG/2 ML IJ SOLN
INTRAMUSCULAR | Status: AC
Start: 2023-05-19 — End: 2023-05-19
  Filled 2023-05-19: qty 2

## 2023-05-19 MED ORDER — METOCLOPRAMIDE HCL 5 MG/ML IJ SOLN
10.0000 mg | Freq: Once | INTRAMUSCULAR | Status: AC | PRN
Start: 2023-05-19 — End: 2023-05-19
  Administered 2023-05-19: 10 mg via INTRAVENOUS
  Filled 2023-05-19: qty 2

## 2023-05-19 MED ORDER — ONDANSETRON 4 MG PO TBDP
4.0000 mg | ORAL_TABLET | Freq: Four times a day (QID) | ORAL | Status: DC | PRN
Start: 2023-05-19 — End: 2023-05-21

## 2023-05-19 MED ORDER — SENNOSIDES 8.6 MG PO TABS
17.2000 mg | ORAL_TABLET | Freq: Two times a day (BID) | ORAL | Status: DC
Start: 2023-05-19 — End: 2023-05-21
  Administered 2023-05-19 – 2023-05-21 (×4): 17.2 mg via ORAL
  Filled 2023-05-19 (×4): qty 2

## 2023-05-19 MED ORDER — MAGNESIUM HYDROXIDE 400 MG/5ML PO SUSP
15.0000 mL | Freq: Two times a day (BID) | ORAL | Status: DC | PRN
Start: 2023-05-19 — End: 2023-05-21

## 2023-05-19 MED ORDER — VANCOMYCIN FOR INTRAWOUND
2.0000 g | Freq: Once | Status: AC
Start: 2023-05-19 — End: 2023-05-19
  Administered 2023-05-19: 2 g
  Filled 2023-05-19: qty 2

## 2023-05-19 MED ORDER — MIDAZOLAM HCL 2 MG/2 ML IJ SOLN
1.0000 mg | Freq: Once | INTRAMUSCULAR | Status: DC | PRN
Start: 2023-05-19 — End: 2023-05-19
  Filled 2023-05-19: qty 2

## 2023-05-19 MED ORDER — BUPROPION HCL ER (SR) 100 MG PO TB12
200.0000 mg | ORAL_TABLET | Freq: Every day | ORAL | Status: DC
Start: 2023-05-20 — End: 2023-05-21
  Administered 2023-05-20 – 2023-05-21 (×2): 200 mg via ORAL
  Filled 2023-05-19 (×2): qty 2

## 2023-05-19 MED ORDER — ACETAMINOPHEN 500 MG PO TABS
1000.0000 mg | ORAL_TABLET | Freq: Three times a day (TID) | ORAL | Status: DC
Start: 2023-05-19 — End: 2023-05-21
  Administered 2023-05-19 – 2023-05-21 (×5): 1000 mg via ORAL
  Filled 2023-05-19 (×5): qty 2

## 2023-05-19 SURGICAL SUPPLY — 72 items
10CC STIMULAN RAPID CURE ×2 IMPLANT
3/4 SHEET DRAPE 56X77IN (DRAPE) ×2 IMPLANT
BACK TABLE COVER 44X76 (DRAPE) ×2 IMPLANT
BASIC SINGLE BASIN TRAY (BASIN) ×1 IMPLANT
BIO-PREP BONE PREPARATION KIT ×1 IMPLANT
BLADE SYSTEM ×1 IMPLANT
CHLORAPREP APPLICAOR 26ML ORGN (PREP) ×2 IMPLANT
COBAN WRAP 6IN. L/F (BANDAGE) ×1 IMPLANT
CRYO CUFF KNEE MEDIUM (THERAPY) IMPLANT
DIAMOND ROUND BUR 3.0MM (SURGBUR) IMPLANT
DRAPE FOR TWO TIER OR TABLE (FURN) ×1 IMPLANT
DRESSING SPONGE 4X4 (DRESSING) ×2 IMPLANT
DRSG,OPTIFOAM 4X10" MSC97410 (DRESSING) IMPLANT
EZE-BAND BANDAGE 6IN VELCRO (BANDAGE) IMPLANT
FIBERWIRE 2 SUTURE NEEDLE BLUE (SUTURE) IMPLANT
GOWN FLYTE TOGA 2XL PEEL AWAY (SURGPPE) ×3 IMPLANT
HEADLESS FLUTED STR PIN 1/8 ×1 IMPLANT
HEAVY DUTY EXTREMITY DRAPE (DRAPE) ×1 IMPLANT
HINGED KNEE BRACE 20" C13200B (THERAPY) ×1 IMPLANT
HIP CEMENT MIXER LARGE 306705 (SURGCMT) ×2 IMPLANT
INTERPULSE BONE CLEANING TIP (SUCTION) IMPLANT
INTERPULSE HANDPIECE W/TUBING (SUCTION) IMPLANT
IOBAN INCISE DRAPE 23"X17" (DRAPE) ×1 IMPLANT
KNEE EXTRACTION KIT ×1 IMPLANT
KNEE IMMOBILIZER 20IN (THERAPY) IMPLANT
LIGHT HANDLE COVER RIGID (SURGEQUP) ×2 IMPLANT
MED PROX FEMORAL PRESSURIZER (SURGCMT) ×2 IMPLANT
MESH SYNTH PP MACRO 30X30X1 ×1 IMPLANT
ORTHO PORTEXIS PI GLOVE SZ8.5 (GLOVE) ×5 IMPLANT
PACK  FOOT (PACK) ×1 IMPLANT
PACK TOTAL JOINT (PACK) ×1 IMPLANT
PLUMEPEN ELITE SMOKE EVAC (SMKEVAC) ×1 IMPLANT
PREVENA PLUS SYSTEM NPWT DISP (WOUNDNP) ×1 IMPLANT
RECIPROCATING BLADE DBL/SIDE (SURGPWR) ×1 IMPLANT
RECIPROCATING HD BLADE (SURGPWR) ×1 IMPLANT
ROUND CARBIDE BUR 5MM (SURGBUR) IMPLANT
SAGITTAL BLADE 18MMX90MM (SURGPWR) ×1 IMPLANT
SPECIMEN CONTAINER 4 OZ (SPECIMEN) ×5 IMPLANT
SPONGE LAP,XRAY,RFID (SURGSPG) ×7 IMPLANT
STERI U-DRAPE 47X51 (DRAPE) ×1 IMPLANT
STERILE UNDERCAST PADDING 6IN (CAST&SPL) ×2 IMPLANT
STOCKINETTE 6" DOUBLE PLY (DRAPE) ×1 IMPLANT
STRATAFIX 1 PDS PLUS (SUTURE) ×3 IMPLANT
STRATAFIX SPIRAL MONCRYL + SUT (SUTURE) ×3 IMPLANT
STRYKER REVISIONUNIVERSAL CEMENT RESTRICTOR- DISPO ×1 IMPLANT
SURGEON BLADE #10 (SURGBLA) ×6 IMPLANT
SURGEON BLADE #15 (SURGBLA) ×3 IMPLANT
SURGEON GLOVE LF/PF 8 STER (GLOVE) ×1 IMPLANT
SURGEON GLOVE LF/PF 8.5 STER (GLOVE) ×5 IMPLANT
SUTURE NYLON 2-0 C16 30IN (SUTURE) ×2 IMPLANT
SUTURE POLYSORB 0 GS21 30IN (SUTURE) ×4 IMPLANT
SUTURE SURGIPRO 2-0 GS22 30IN (SUTURE) ×1 IMPLANT
SYSTEM 6 BLADE (SURGPWR) IMPLANT
SYSTEM 6 BLADE 25MMX47.5MMX.64 (SURGPWR) IMPLANT
SYSTEM IRRIGATION, SURGIPHOR (IRR) ×1 IMPLANT
SZ 8 L/F ORTH SURG GLOVE (GLOVE) ×1 IMPLANT
TEGADERM DRESSING 2 3/8 X 2.75 (DRESSING) ×1 IMPLANT
TI CRON TAPER CUT NEEDLE SZ5 (SUTURE) ×7 IMPLANT
TOBRAMYCN BONE CEMENT ×4 IMPLANT
TOP DRAPE 102X53 STERILE (DRAPE) ×1 IMPLANT
TOURNIQUET DISPOSABLE 30IN (SURGPNE) ×1 IMPLANT
TRI SYM CONE AUGMENT SIZE 8 ×1 IMPLANT
TRIATHLONG CEMENT STEM 15X100M ×1 IMPLANT
Triathlon Hinge Bumper deg 0 ×1 IMPLANT
Triathlon Hinge Bushings and Axle sze STD ×1 IMPLANT
Triathlon Hinge Femoral Component sze #4 RT HINGE ×1 IMPLANT
Triathlon Hinge Insert sze 3, thkns 11mm, typ HING ×1 IMPLANT
Triathlon Hinge Tibial Bearing Component sze 3-4 ×1 IMPLANT
Triathlon Revision Tibial Baseplate #3 ×1 IMPLANT
Triathlon Total Knee Cemented Stem 9mmx 50mm ×1 IMPLANT
UNIV CEMENT RESTRICTOR ×1 IMPLANT
X-ACT ROM UNIVERSAL BRACE (THERAPY) ×1 IMPLANT

## 2023-05-19 NOTE — Telephone Encounter (Signed)
Message sent to PCP to review.    ----- Message from Louanne Skye sent at 05/19/2023 11:13 AM EST -----  Regarding: question  Cheryl Dixon [id number], 68 year old, female, Telephone Information:  Home Phone      [phone]  Work Phone      Not on file.  Mobile          [phone]           Calls today: Other: Pt daughter called--she states she is interested in signing her mother up for meals on wheels--they told her she has to go through PCP--please advise     Person calling on behalf of patient: Daughter    [name] NUMBER: [phone] Daughter    Patient's language of care: English    Patient does not need an interpreter.    Patient's PCP: Hildred Laser, MD

## 2023-05-19 NOTE — Telephone Encounter (Signed)
She should call [facility] and ask to be included in the program and they send me a form for what type of meals they can bring.

## 2023-05-19 NOTE — Discharge Inst - Anticoagulation Mgmt (Addendum)
-   You will be on Aspirin, a blood thinner, for 4 weeks postop to prevent blood clots. Your last dose will be on 06/17/23. THEN RESUME YOUR HOME DOSE.    You should ask your nurse for more information on this medicine if you have any questions.

## 2023-05-19 NOTE — Telephone Encounter (Signed)
Pt headed in to OR early per our conversation, taking a cab

## 2023-05-19 NOTE — Event Note (Signed)
S/p right extensor mechanism reconstruction, removal of hardware, conversion to a constrained TKA and patella excision with Dr. Lorel Monaco. No intra-op complications.    Patient will be STRICTLY IN EXTENSION to protect the new repair. Bledsoe brace should remain on and LOCKED IN EXTENSION AT ALL TIMES OVERNIGHT.  - Orthopedics will remove brace tomorrow and replace with long leg walking cast. Until then NWB RLE OVERNIGHT.    Foley should remain in place until POD1; okay to pull ONLY AFTER THE BRACE HAS BEEN REMOVED.    Please page the orthopedic provider on call overnight through [employer] with any questions or concerns.    Myer Haff, PA-C

## 2023-05-19 NOTE — Discharge Instructions (Addendum)
-   You had a right knee extensor mechanism reconstruction and revision to a constrained TKA with Dr. Lorel Monaco on 05/19/23   - Weight bearing status Weightbearing As Tolerated RLE ONCE IN THE CAST. Use your walker to help you walk.  - You will have a wound vac in place. The battery initial battery will last 7 days, When that battery dies, switch over to the 14 day battery. Do not get the dressing or cast wet. We will cut a window into your cast over the dressing to allow for the wound vac.  - You have stitches that are buried under the skin and will dissolve over time. If you have staples in the skin, they will be removed 2-3 weeks post op.  - You will be on Aspirin, a blood thinner, for 4 weeks postop to prevent blood clots. Your last dose will be on 06/17/23. THEN RESUME YOUR HOME DOSE.  - You were prescribed Celebrex and Tylenol. You should take these on a schedule to help keep your pain under control.  - You have been given a prescription for narcotic pain medication for severe breakthrough pain not well controlled by the Celebrex and Tylenol. Please take as directed and taper down use as tolerated. Drink plenty of water and take Colace (stool softener) to help prevent constipation. You may take Senna (laxative) nightly as well.  - Attend your physical therapy and orthopaedic follow-up appointments.  - If you need a refill of your pain medication, please call 48 hours in advance. All requests for medication refills will be addressed within 48 hours. No prescription refills will be written after hours (5pm-8:30am) or on the weekend.  - Please call the Dept of Orthopaedics at [phone] with questions/concerns, if you develop drainage from your incision sites, if you develop redness around your incision, if you experience calf pain/swelling, if you develop numbness or tingling, if you have fevers/chills, or if you have a fall. Please go to the nearest emergency department if you develop shortness of breath or chest  pain.

## 2023-05-19 NOTE — Anesthesia Preprocedure Evaluation (Addendum)
Pre-Anesthetic Note  .      Patient: Cheryl Dixon is a 68 year old female with PMH CVA 12/19/2021, COPD, aortic aneurysm, preDM, OAB, anxiety/depression, HLD, OUD on suboxone who presents for revision, TKR. NO previous issues with anesthesia reported.     Procedure Information       Date/Time: 05/19/23 1255    Procedures:       REVISION, TOTAL ARTHROPLASTY, KNEE, ALL COMPONENTS (Right)      REPAIR, TENDON, PATELLAR (Right)    Diagnosis: Rupture patellar tendon, right, subsequent encounter [S86.811D]    Pre-op diagnosis: Rupture patellar tendon, right, subsequent encounter [S86.811D]    [facility]    Surgeons: [name], MD            Relevant Problems   PULMONARY   (+) Chronic obstructive pulmonary disease (HCC)      NEURO/PSYCH   (+) Cerebrovascular accident (CVA) (HCC)      CARDIO   (+) Aneurysm of ascending aorta without rupture (HCC)   (+) Essential hypertension           Previous Anesthetic History:   Past Surgical History:  2023: COLONOSCOPY  No date: ENDOMETRIAL ABLTJ THERMAL W/O HYSTEROSCOPIC GID  No date: PR ANES HRNA REPAIR UPR ABD TABDL RPR DIPHRG HRNA  No date: PR ANES IPER LOWER ABD W/LAPS RAD HYSTERECTOMY  No date: RPR 1ST INGUN HRNA PRETERM INFT RDC        Medications  Current Facility-Administered Medications   Medication    ceFAZolin (ANCEF) 2 g in sodium chloride 0.9 % 100 mL minibag plus    celecoxib (CeleBREX) capsule 200 mg    acetaminophen (TYLENOL) tablet 1,000 mg    tranexamic acid (CYKLOKAPRON) IVPB premixed 1,000 mg         Allergies:   Review of Patient's Allergies indicates:   Darvon                     Meperidine hcl              Comment:Nausea/vomit   Paxil [paroxetine]      Rash   Zoloft [sertraline *    Rash    Smoking, Alcohol, Drugs:  Social History    Tobacco Use      Smoking status: Former        Packs/day: 0.00        Years: 1 pack/day for 32.0 years (32.0 ttl pk-yrs)        Types: Cigarettes        Start date: 09/26/1989        Quit date: 09/26/2021        Years  since quitting: 1.6        Passive exposure: Never      Smokeless tobacco: Never      Tobacco comments: quit 1980-1996, then light until 2003, then 1 ppd since    Alcohol use: No    Drug use:         Types:   Marijuana      Comment:   marijuana- last dose sunday 03/08/23 past opioids, nasal heroin, 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  MJ 1x per week          PMHx:  Past Medical History:  No date: Anxiety  No date: Arthritis  No date: COPD (chronic obstructive pulmonary disease) (HCC)  No date: Depression  No date: Obese  No date: Stroke [facility])  No date: Substance addiction (HCC)  No date: Varicose veins of lower extremities    Vitals  Temp 97.2 F (36.2 C) (Temporal)   Ht 5' (1.524 m)   Wt 90 kg (198 lb 6.6 oz)   LMP 11/20/1991   BMI 38.75 kg/m     Review of Systems     Patient summary reviewed and Nursing notes reviewed      Anesthetic History:   negative anesthesia history ROS           Cardiovascular:  Positive for hypercholesterolemia and hypertension. Negative for chest pain and palpitations.   Physical Activity in METs greater than 4 (stationary bike 10 min several times daily, laundry, light housecleaning)    Pulmonary: Positive for COPD (never hospitalized) and tobacco use (quit 2023). Negative for obstructive sleep apnea and recent URI.   GU/Renal:  Positive for frequency (OAB). Negative for kidney disease.   Hepatic: Skin negative for hepatic disease.    Neurological:  Positive for gait problem (cane, no recent falls) and strokes (12/19/2021). Negative for headaches, seizures, mononeuropathy and dizziness.        CVA 8/23, no residual no mononeuropathy  Gastrointestinal:  Positive for constipation (well managed on meds) and GERD.   Hematological: Negative for DVT, pulmonary embolism and bleeding disorder.        Had transfusion reaction (fever) 30+ yrs ago post op hernia repair    Endocrine: Positive for Diabetes (preDM HgA1c 5.6 08/20/22).   Head:  Negative for neck stiffness.   Eyes:  Negative for glaucoma.   Ears:   Negative for hearing loss.   Throat/Mouth:  Positive for sore throat (dry mouth assoc with inhalers). Negative for dental problems (upper full) and trouble swallowing.   Musculoskeletal:  Positive for arthralgias (Right knee OA, left shoulder pain sec to cane use-pt is left handed).   Psychiatric:  Positive for depression, anxiety, substance use, marijuana use (edibles 20mg  at HS 2-3 x/week) and opiate use (on suboxone Dr Berneice Gandy Miller/Roll, last use opiates, nasal heroin 68yrs).    Constitutional: Negative for constitutional diseases.  Negative for unexpected weight change.   Skin: Negative for skin diseases.        Physical Exam:     General     Level of consciousness:  Alert and oriented (time, person, place)     BMI   BMI greater than 30 kg/m2      Airway     Mallampati:  II    TM distance:  >3 FB    Mouth opening:  >3 FB    Neck ROM:  Full     Teeth    (+) upper dentures    }     Heart  - normal exam       Lungs - normal exam  Breath sounds clear to auscultation          HEENT: normal exam  head/ face normal     Abdomen: normal exam    soft  bowel sounds normal                         Pertinent Labs:   Lab Results   Component Value Date    NA 137 03/30/2023    K 4.4 03/30/2023    CREAT 0.8 03/30/2023    GLUCOSER 121 03/30/2023    WBC 8.0 05/06/2023    HCT 39.5 05/06/2023    PLTA 351 05/06/2023    PT 12.2 12/12/2021    APTT 26.1 02/16/2007  INR 1.1 12/12/2021         Anesthesia Plan    ASA Score:     ASA:  3    Airway:      Mallampati:  II    Mouth opening:  >3 FB    Neck ROM:  Full    TM distance:  >3 FB     Plan: general    Other information:     EKG Reviewed: : Yes      Full Stomach Precaution:: No      Post-Plan::  Floor and PACU    Anesthesia Assessment and Plan:        GA     Informed Consent:     Anesthetic plan and risks discussed with:  Patient   Patient Consented        Attending Anesthesiologist Statement:     Reassessed day of surgery: Yes        Assessment made, necessary equipment and  appropriate plan in place.

## 2023-05-19 NOTE — Interval H&P Note (Signed)
Patient Assessment Update:   Re-evaluation including history review and physical examination has been performed.    Patient's Condition No Change    Myer Haff, PA-C, 05/19/2023

## 2023-05-19 NOTE — Anesthesia Postprocedure Evaluation (Signed)
Anesthesia Post-Operative Evaluation Note    Patient: Cheryl Dixon           Procedure Summary       Date: 05/19/23 Room / [facility]    Anesthesia Start: 1514 Anesthesia Stop:     Procedures:       REVISION, TOTAL ARTHROPLASTY, KNEE, ALL COMPONENTS (Right)      REPAIR, TENDON, PATELLAR (Right) Diagnosis:       Rupture patellar tendon, right, subsequent encounter      (Rupture patellar tendon, right, subsequent encounter [W09.811B])    Surgeons: [name], MD Responsible Provider: Arnoldo Lenis, MD    Anesthesia Type: general ASA Status: 3              POST-OPERATIVE EVALUATION    Anesthesia Post Evaluation        MIPS#404 Anesthesiology Smoking Abstinence:     The patient is a current smoker (e.g. cigarette, cigar, pipe, e-cigarette/vaping/marijuana) (J4782): Yes   The patient smoked the day of the procedure (N5621): No  FOR CODING USE ONLY: IF BLANK H0865    MIPS#477 Multimodal Pain Management:  Emergent - Exclusion case: No  Patient was administered multimodal pain management (two or more drugs and/or interventions excluding systemic opioids) in the perioperative period; occurring at some time between 6 hours prior to anesthesia start time until discharged from PACU (H8469): Yes   FOR CODING USE ONLY: REASON NOT LISTED G2952    WUXL#244 Perioperative Temperature Management:  Anesthesia start to Anesthesia end time was 60 minutes or longer (4255F): Yes   Anesthesia administered was General (Inhalational, or TIVA) or Neuraxial block (W1027): Yes   At least one body temperature greater than 95.87F/35.5C achieved within the 30 mins immediately prior to or the15 mins immediately following anesthesia end time (O5366): Yes   FOR CODING USE ONLY: REASON NOT LISTED Y4034    MIPS#430 Adult Prevention of PONV:  Patient received an inhalational anesthetic (4554F): Yes  Patient exhibits three or more risk factors for PONV (V4259): No  FOR CODING USE ONLY: REASON NOT LISTED D6387    MIPS#463 Pediatric  Prevention of PONV:  Pediatric patient?: No  FOR CODING USE ONLY: REASON NOT LISTED F6433        [employer] # [id number]        Last vitals    BP: (!) 147/97 (05/19/2023  1:08 PM)  Temp: 97.2 F (36.2 C) (05/19/2023 11:04 AM)  Pulse: 66 (05/19/2023  1:08 PM)  Resp: 18 (05/19/2023  1:08 PM)  SpO2: 99 % (05/19/2023  1:08 PM)

## 2023-05-19 NOTE — Telephone Encounter (Signed)
-----   Message from [place] P sent at 05/19/2023 11:13 AM EST -----  Regarding: question  Cheryl Dixon [id number], 68 year old, female, Telephone Information:  Home Phone      [phone]  Work Phone      Not on file.  Mobile          [phone]           Calls today: Other: Pt daughter called--she states she is interested in signing her mother up for meals on wheels--they told her she has to go through PCP--please advise     Person calling on behalf of patient: Daughter    [name] NUMBER: [phone] Daughter    Patient's language of care: English    Patient does not need an interpreter.    Patient's PCP: Hildred Laser, MD

## 2023-05-19 NOTE — Discharge Instr - Activity (Signed)
-   You had a right knee extensor mechanism reconstruction and revision to a constrained TKA with Dr. Lorel Monaco on 05/19/23   - Weight bearing status Weightbearing As Tolerated RLE ONCE IN THE CAST. Use your walker to help you walk.

## 2023-05-19 NOTE — Discharge Instr - Diet (Addendum)
Diet: no restrictions

## 2023-05-19 NOTE — Discharge Inst - Wound Care (Addendum)
-   You will have a wound vac in place. The battery initial battery will last 7 days, When that battery dies, switch over to the 14 day battery. Do not get the dressing or cast wet. We will cut a window into your cast over the dressing to allow for the wound vac.  - You have stitches that are buried under the skin and will dissolve over time. If you have staples in the skin, they will be removed 2-3 weeks post op.

## 2023-05-19 NOTE — Discharge Summary (Signed)
Physician Discharge Summary     Patient ID:  Cheryl Dixon  [id number]  68 year old  [birth year]    Admit date: 05/19/23    Discharge date and time: 05/21/23    Admitting Physician: [name], MD     Discharge Physician: [name], MD    Admission Diagnoses: Rupture patellar tendon, right, subsequent encounter [S86.811D]  Patellar tendon rupture, right, sequela [S86.811S]    Discharge Diagnoses:   S/p right patella tendon repair  OUD on Suboxone  Anxiety  COPD    Admission Condition: fair    Discharged Condition: good    Indication for Admission: s/p right knee extensor mechanism reconstruction, ROH and conversion to constrained TKA with Dr. Althea Grimmer Course: Patient was brought to the OR for a right knee extensor mechanism reconstruction using mesh, removal of hardware and conversion to constrained total knee arthroplasty with Dr. Lorel Monaco. The procedure went well and there were no intraoperative complications. EBL was 75cc. The patient was brought to the PACU in stable condition with wound vac and long leg splint in place. Patient was admitted to the inpatient floor in stable condition.    On POD 1, patient had pain in the PACU which then resolved upon arriving to the floor.  In the a.m. the patient reported the pain was well-controlled.  She was in a Bledsoe brace locked in extension with an Ace bandage applied to the entire right lower extremity.  This was removed today and the patient was placed into a very well-padded cast and extension, long-leg, with a window to allow for access to the [facility] dressing if needed.  The patient work with physical therapy, but was unable to ambulate further than the doorway of her inpatient room due to pain.  She did well otherwise thus physical therapy cleared the patient for safe discharge home with family support once they have obtained a wheelchair.  The patient had discussed this with her children who will obtain this for tomorrow planned discharge.    On POD  2, there were no overnight events. She denies any chest pains, shortness of breath, nausea, vomiting, dizziness or distal numbness or tingling. Baby aspirin continued for DVT prophylaxis. Her pain is managed on a multi-modal oral regimen. She is tolerating an oral diet. She worked with PT/OT and made progress. She has a walker with her. Additional 14-day wound VAC pump given to patient with directions.  Plan for discharge today.     Consults: physical therapy    Significant Diagnostic Studies: labs: CBC, BMP    Treatments: IV hydration, antibiotics: Ancef, analgesia: acetaminophen and Dilaudid, and anticoagulation: ASA    Discharge Exam:  BP 153/77   Pulse 78   Temp 98.4 F (36.9 C) (Oral)   Resp 18   Ht 5\' 1"  (1.549 m)   Wt 93 kg (205 lb)   LMP 11/20/1991   SpO2 95%   BMI 38.73 kg/m   General appearance: alert, appears stated age, and cooperative  Head: Normocephalic, without obvious abnormality, atraumatic  Neck: supple, symmetrical, trachea midline  Lungs:  Non-labored breathing. No extra work of breathing noted.  Extremities: RLE: Long leg cast clean, dry and intact. Window with Prevena dressing to suction. Gross sensation intact above and below cast. Toes warm and well perfused. 2+ DP     Disposition: Home    Patient Instructions:   Current Discharge Medication List    START taking these medications    celecoxib (CELEBREX) 200  MG capsule  Take 1 capsule by mouth daily  Qty: 30 capsule Refills: 0    HYDROmorphone (DILAUDID) 4 MG tablet  Take 1 tablet by mouth every 6 (six) hours as needed Max Daily Amount: 16 mg  for up to 7 days  Qty: 20 tablet Refills: 0    cefadroxil (DURICEF) 1 GM tablet  Take 1 tablet by mouth in the morning and 1 tablet before bedtime. Do all this for 14 days.  Qty: 28 tablet Refills: 0      CONTINUE these medications which have CHANGED    hydrocortisone 2.5 % cream  Apply topically 2 (two) times daily To affected areas of scalp and ears  Qty: 56 g Refills: 1    acetaminophen  (TYLENOL) 500 MG tablet  Take 2 tablets by mouth every 8 (eight) hours as needed for Pain  Qty: 60 tablet Refills: 0    aspirin (ASPIRIN 81) 81 MG chewable tablet  Take 1 tablet by mouth in the morning and 1 tablet before bedtime.  Qty: 56 tablet Refills: 0      CONTINUE these medications which have NOT CHANGED    cetirizine (ZYRTEC) 10 MG tablet  TAKE 1 TABLET BY MOUTH EVERY DAY IN THE MORNING  Qty: 90 tablet Refills: 3  Associated Diagnoses:Acute cough; COPD with acute exacerbation (HCC)    polyethylene glycol (GLYCOLAX/MIRALAX) 17 g packet  Take 1 packet by mouth daily  Qty: 90 packet Refills: 3  Associated Diagnoses:Drug-induced constipation    albuterol HFA 108 (90 Base) MCG/ACT inhaler  Inhale 2 puffs into the lungs every 6 (six) hours as needed for Wheezing    buprenorphine-naloxone (SUBOXONE) 8-2 MG sublingual film  Take 1.5 films under the tongue in the morning, 1 film midday, and 1 film at night  Qty: 98 Film Refills: 0  Associated Diagnoses:Opioid dependence on agonist therapy (HCC)    docusate sodium (COLACE) 100 MG capsule  TAKE 1 CAPSULE BY MOUTH IN THE MORNING AND BEFORE BEDTIME  Qty: 180 capsule Refills: 3    estrogen, conjugated, (PREMARIN) 0.625 MG/GM vaginal cream  Place vaginally daily    busPIRone (BUSPAR) 5 MG tablet  Take 5 mg by mouth in the morning and 5 mg before bedtime.    solifenacin (VESICARE) 10 MG tablet  TAKE 1 TABLET BY MOUTH EVERY DAY IN THE MORNING  Qty: 90 tablet Refills: 0  Associated Diagnoses:Overactive bladder    famotidine (PEPCID) 20 MG tablet  TAKE 1 TABLET BY MOUTH IN THE MORNING AND BEFORE BEDTIME  Qty: 180 tablet Refills: 0  Associated Diagnoses:Epigastric pain    tiotropium-olodaterol (STIOLTO RESPIMAT) 2.5-2.5 MCG/ACT inhal  Inhale 2 puffs into the lungs daily    buPROPion (WELLBUTRIN SR) 200 MG 12 hr tablet  TAKE 1 TABLET BY MOUTH IN THE MORNING AND BEFORE BEDTIME  Qty: 180 tablet Refills: 3    VITAMIN D3 50 MCG (2000 UT) TABS tablet  TAKE 1 TABLET BY MOUTH EVERY DAY  IN THE MORNING  Qty: 90 tablet Refills: 3    atorvastatin (LIPITOR) 80 MG tablet  Take 1 tablet by mouth Daily after dinner  Qty: 90 tablet Refills: 3      Activity: activity as tolerated  Diet: regular diet  Wound Care: none needed    Code Status during hospital stay: Full Code    [place] Future Appointments:  Current and Future Appointments at [facility] (90 Days) 05/21/2023 - 08/19/2023        Date Visit Type Length  Department Provider     06/02/2023 10:30 AM TELEVISIT GROUP PC 60 min [facility] [[id number]] Azzie Glatter, MD    Patient Instructions:     This group will take place by video or in some cases by phone. You will receive an email with instructions on how to join this visit by Google Meet prior to the appointment.                06/10/2023 11:15 AM ORTHO POST OP 15 15 min [facility] - Orthopedics [[id number]] [name], MD    Patient Instructions:                   06/30/2023 10:30 AM TELEVISIT GROUP PC 60 min [facility] [[id number]] Azzie Glatter, MD    Patient Instructions:     This group will take place by video or in some cases by phone. You will receive an email with instructions on how to join this visit by Google Meet prior to the appointment.                07/28/2023 10:30 AM TELEVISIT GROUP PC 60 min [facility] [place] [facility] [[id number]] Azzie Glatter, MD    Patient Instructions:     This group will take place by video or in some cases by phone. You will receive an email with instructions on how to join this visit by Google Meet prior to the appointment.                       Signed:  Rebecca [place], PA-C  05/21/2023  11:13 AM

## 2023-05-20 ENCOUNTER — Encounter (HOSPITAL_BASED_OUTPATIENT_CLINIC_OR_DEPARTMENT_OTHER): Payer: Self-pay

## 2023-05-20 ENCOUNTER — Ambulatory Visit: Payer: 59 | Admitting: Family Medicine

## 2023-05-20 DIAGNOSIS — T84092A Other mechanical complication of internal right knee prosthesis, initial encounter: Secondary | ICD-10-CM

## 2023-05-20 DIAGNOSIS — S86811A Strain of other muscle(s) and tendon(s) at lower leg level, right leg, initial encounter: Secondary | ICD-10-CM

## 2023-05-20 DIAGNOSIS — S86811D Strain of other muscle(s) and tendon(s) at lower leg level, right leg, subsequent encounter: Secondary | ICD-10-CM | POA: Diagnosis not present

## 2023-05-20 LAB — CBC WITH PLATELET
ABSOLUTE NRBC COUNT: 0 10*3/uL (ref 0.0–0.0)
HEMATOCRIT: 29.7 % — ABNORMAL LOW (ref 34.1–44.9)
HEMOGLOBIN: 9 g/dL — ABNORMAL LOW (ref 11.2–15.7)
MEAN CORP HGB CONC: 30.3 g/dL — ABNORMAL LOW (ref 31.0–37.0)
MEAN CORPUSCULAR HGB: 25.4 pg — ABNORMAL LOW (ref 26.0–34.0)
MEAN CORPUSCULAR VOL: 83.9 fL (ref 80.0–100.0)
MEAN PLATELET VOLUME: 10.1 fL (ref 8.7–12.5)
NRBC %: 0 % (ref 0.0–0.0)
PLATELET COUNT: 218 10*3/uL (ref 150–400)
RBC DISTRIBUTION WIDTH STD DEV: 43.5 fL (ref 35.1–46.3)
RED BLOOD CELL COUNT: 3.54 M/uL — ABNORMAL LOW (ref 3.90–5.20)
WHITE BLOOD CELL COUNT: 14.8 10*3/uL — ABNORMAL HIGH (ref 4.0–11.0)

## 2023-05-20 LAB — BASIC METABOLIC PANEL
ANION GAP: 10 mmol/L (ref 10–22)
BUN (UREA NITROGEN): 12 mg/dL (ref 7–18)
CALCIUM: 8.2 mg/dL — ABNORMAL LOW (ref 8.5–10.5)
CARBON DIOXIDE: 27 mmol/L (ref 21–32)
CHLORIDE: 99 mmol/L (ref 98–107)
CREATININE: 0.9 mg/dL (ref 0.4–1.2)
ESTIMATED GLOMERULAR FILT RATE: >60 mL/min (ref >60)
Glucose Random: 102 mg/dL (ref 74–160)
POTASSIUM: 4.2 mmol/L (ref 3.5–5.1)
SODIUM: 135 mmol/L — ABNORMAL LOW (ref 136–145)

## 2023-05-20 MED ORDER — OLODATEROL HCL 2.5 MCG/ACT IN AERS
2.0000 | INHALATION_SPRAY | Freq: Every day | RESPIRATORY_TRACT | Status: DC
Start: 2023-05-20 — End: 2023-05-21
  Administered 2023-05-20: 2 via RESPIRATORY_TRACT
  Filled 2023-05-20: qty 4

## 2023-05-20 MED ORDER — OXYBUTYNIN CHLORIDE 5 MG PO TABS
5.0000 mg | ORAL_TABLET | Freq: Two times a day (BID) | ORAL | Status: DC
Start: 2023-05-20 — End: 2023-05-21
  Administered 2023-05-20 – 2023-05-21 (×3): 5 mg via ORAL
  Filled 2023-05-20 (×3): qty 1

## 2023-05-20 MED ORDER — TIOTROPIUM BROMIDE MONOHYDRATE 2.5 MCG/ACT IN AERS
2.0000 | INHALATION_SPRAY | Freq: Every day | RESPIRATORY_TRACT | Status: DC
Start: 2023-05-20 — End: 2023-05-21
  Administered 2023-05-21: 1 via RESPIRATORY_TRACT
  Filled 2023-05-20: qty 4

## 2023-05-20 NOTE — Plan of Care (Signed)
Alert and oriented, reports right knee pain 8/10, medicated with sch and PRN medications as requested, with good relief. Patient reports pain is slightly better today. Immobilizer/dressing/prevena vac in place to RLE, no drainage noted. RLE elevated on pillow. Neuros intact. Foley draining clear yellow urine, ABD SNT +BS no N/V, last BM 1/21.     1030 Immobilizer removed and changed to hard cast. Foley removed per MD order and due to void by 7:30 PM.     Problem: Activity:  Goal: Mobility will be supported throughout the hospitalization,  Outcome: Progressing  Note: Encouraged working with PT as tolerated.     Problem: Safety:  Goal: Will remain free from falls throughout the hospitalization,  Outcome: Progressing  Note: Call bell within reach,able to make needs known.

## 2023-05-20 NOTE — Progress Notes (Addendum)
Addendum:  Patient is well with therapy, but the functional capacity was limited due to pain.  Patient is only able to ambulate to the door.  Physical therapy is okay with clearing the patient once they have obtained a wheelchair.  Patient will stay overnight tonight, and plan to work on obtaining wheelchair today / tomorrow with tentative discharge plan for tomorrow once the wheelchair has been obtained.    Orthopedic Surgery Progress Note  S/p: Removal of hardware, extensor mechanism reconstruction, conversion to constrained TKA with Dr. Lorel Monaco on 05/19/2023.   POD: 1    S: Patient doing well in regard to pain this a.m.  Overnight she did have increased pain, but this resolved upon arriving to the floor.  Patient does state that the spinal did allow for better pain control immediately following surgery.  She is doing well otherwise.  She is awaiting removal of her hinged brace into long-leg cast.  This was applied at today's visit.    O:  Scheduled Medications:   docusate sodium  100 mg Oral TID    sennosides  17.2 mg Oral BID    pantoprazole  40 mg Oral Daily    multivitamin  1 tablet Oral Daily    ketorolac  15 mg Intravenous Q8H SCH    [START ON 05/21/2023] celecoxib  200 mg Oral Daily    acetaminophen  1,000 mg Oral Q8H SCH    aspirin  81 mg Oral BID    atorvastatin  80 mg Oral Daily after dinner    buprenorphine-naloxone  12 mg Sublingual QAM    And    buprenorphine-naloxone  8 mg Sublingual Daily with lunch    And    buprenorphine-naloxone  8 mg Sublingual Nightly    buPROPion  200 mg Oral Daily    NON FORMULARY 10 mg  10 mg Oral QAM    NON FORMULARY 2 puff  2 puff Inhalation Daily with lunch    ceFAZolin  2 g Intravenous Q8H SCH    Gatorade regular   Oral Q6H       Vitals:   Patient Vitals for the past 24 hrs:   BP Temp Temp src Pulse Resp SpO2 Height Weight   05/20/23 0820 -- -- -- -- 18 -- -- --   05/20/23 0819 116/64 -- -- 76 20 -- -- --   05/20/23 0508 116/64 98.4 F (36.9 C) Oral 76 20 96 % -- --    05/20/23 0125 148/89 98.6 F (37 C) Oral 89 20 98 % -- --   05/19/23 2301 143/89 98.4 F (36.9 C) Oral 82 20 95 % 5\' 1"  (1.549 m) 93 kg (205 lb)   05/19/23 2245 151/86 -- -- 87 15 99 % -- --   05/19/23 2230 150/86 -- -- 85 17 99 % -- --   05/19/23 2215 (!) 133/98 -- -- 88 18 99 % -- --   05/19/23 2200 (!) 132/91 98.3 F (36.8 C) Temporal 81 20 99 % -- --   05/19/23 2145 (!) 157/92 -- -- 81 18 99 % -- --   05/19/23 2144 (!) 151/95 -- -- 81 19 98 % -- --   05/19/23 2139 (!) 120/92 -- -- 84 20 -- -- --   05/19/23 2132 (!) 120/92 -- -- -- -- -- -- --   05/19/23 2130 (!) 120/92 -- -- 85 24 95 % -- --   05/19/23 2124 (!) 138/105 -- -- 86 15 -- -- --   05/19/23 2118  120/78 -- -- 86 17 93 % -- --   05/19/23 2115 120/78 -- -- 86 16 95 % -- --   05/19/23 2113 120/86 -- -- 86 17 91 % -- --   05/19/23 2108 126/74 97.4 F (36.3 C) Temporal 85 (!) 32 93 % -- --   05/19/23 1308 (!) 147/97 -- -- 66 18 99 % 5' (1.524 m) 90 kg (198 lb 6.6 oz)   05/19/23 1305 -- -- -- -- -- -- 5' (1.524 m) 90 kg (198 lb 6.6 oz)   05/19/23 1104 -- 97.2 F (36.2 C) Temporal -- -- -- 5\' 3"  (1.6 m) 90 kg (198 lb 8 oz)       Labs:    Recent Labs     05/20/23  0745   NA 135*   K 4.2   CL 99   CO2 27   BUN 12   CREAT 0.9   GLUCOSER 102   CA 8.2*   WBC 14.8*   HGB 9.0*   HCT 29.7*   PLTA 218       I/O:   I/O 24 Hrs:  In: 1800 [P.O.:200; IV Piggyback:100]  Out: 1575 [Urine:1500; Blood:75]    Physical Examination:   General: lying in bed, NAD, A&Ox3  CV: RRR  Lungs: unlabored breathing   Musculoskeletal: Focused examination of the right lower extremity reveals a Bledsoe brace and an Ace wrap.  This was removed which revealed a Prevena VAC dressing in place.  Holding suction.  No evidence of erythema or other infection signs.  Patient is able to plantar and dorsiflex the right ankle.  She is able to wiggle the toes.  She reports paresthesias, but has maintained sensation.    Imaging: Revision right total knee arthroplasty with removal of hardware,  conversion to constrained/stemmed TKA, extensor mechanism reconstruction.  Stimulant beads noted.    A/P: This is a 68 year old female POD 1 s/p Removal of hardware, extensor mechanism reconstruction, conversion to constrained TKA with Dr. Lorel Monaco on 05/19/2023.     # Wound: Cast and Prevena VAC dressing applied.  Holding suction.  No output at today's evaluation.  Cast and Prevena VAC dressing will be left in place until her first postoperative visit  # Heme: H/H 9/29.7; mild acute anemia secondary to post-operative blood loss. Monitor CBC. Patient is asymptomatic; continue to monitor.   # Analgesia: Pain is well-controlled.  Continue current regiment  # Activity: Activity as tolerated, weightbearing as tolerated in the right lower extremity in cast and extension with ambulatory aid.  No range of motion at the knee.. Continue working with PT/OT.   # Anticoagulation: Aspirin 81 mg x twice daily x 28 days. Last dose 06/16/2023.   # Proph: bowel regimen, encourage IS, venodynes.   # Dispo: Patient wants dispo home w/family support pending PT clearance. Follow-up appointment scheduled with Dr. Lorel Monaco for 06/10/2023.     Patient was seen independently today and was discussed with orthopedic attending who agrees with the treatment plan.     Please contact the orthopedic on call provider listed in [employer] with any issues.     Franco Nones, PA-C, 05/20/2023    [phone]

## 2023-05-20 NOTE — Progress Notes (Signed)
Attempted to see pt. Pt currently getting long leg cast fabricated/placed by orthopedics. PT to f/u soon.    05/20/23 1021   Language Information   Language Needs Met By: No Direct Contact with Patient   Rehab Discipline   Rehab Discipline PT   General   Missed Treatment Reason Other (Comment)       Jannet Askew, PT, Lic # [id number]

## 2023-05-20 NOTE — Telephone Encounter (Signed)
Call attempted to daughter,unable to reach,left message to return call.    She should call [facility] and ask to be included in the program and they send me a form for what type of meals they can bring.

## 2023-05-20 NOTE — Plan of Care (Signed)
Problem: Activity:  Goal: Mobility will be supported throughout the hospitalization,  Outcome: Progressing     Problem: Cognitive:  Goal: Caregivers and patients knowledge of risk factors and measures for prevention of condition will be supported throughout the hospitalization  Outcome: Progressing     Problem: Safety:  Goal: Will remain free from falls throughout the hospitalization,  Outcome: Progressing

## 2023-05-20 NOTE — Case Mgmt Note (Signed)
Patient discussed in MDR. Patient s/p removal of hardware, extensor mechanism reonstruction conversion to constrained TKA with Dr. Lorel Monaco on 05/19/2023. PT eval pending. CM met patient at bedside, patient states she is independent at baseline. She ambulates with a r/w. She lives alone in an elevator accessible apartment. Her children are close by. She had [facility] in past and amendable to it again. She does not want to go to rehab. She and her daughters have prepped food and prepared for this procedure so she will be okay going home.   Referral for [facility] has been sent. Caitlyn from [facility] updated.

## 2023-05-20 NOTE — Progress Notes (Signed)
Inpatient Physical Therapy Initial Evaluation    S: c/o pain. "My son's going to have to stay with me. I can't do this alone at this point."     O:Plan of care has been reviewed and updated as necessary.  Vitals signs stable. No complaint of lightheadedness/dizziness. Pt complained of severe pain and this limited walking tolerance.      05/20/23 1232   Language Information   Language Needs Met By: [place]ox P[place] Used Effectively By Patient   Evaluation Type   Evaluation Type Initial Evaluation   Rehab Discipline   Rehab Discipline PT   Safety Devices   Type of Devices Call bell in place   Weight Bearing Status   RLE WBAT  (WBAT in long leg cast; NWB when in Bledsoe brace)   LLE FWB   RUE FWB   LUE FWB   Precautions   Total Knee Replacement Precaution   (long leg cast)   Services prior to admission?   Type of Home Care Services None   Premorbid Mobility   Transfers Independent   Walking Used assistive device;Household distances only;Community distances   Walking assistive devices used Rolling walker   Stair negotiation Required assistance   ADL / IADL Baseline Status   ADL/IADL Baseline Status Yes   ADL's/IADL's   Dressing Independent   Bathing Independent   Household Chores Required assistance   Premorbid Cognition   Cognition Intact   Premorbid Communication   Communication Normal   Living Situation   Living Field seismologist assessible apartment   Lives With Alone  (lives alone but will have family and friends staying. Will have adult child stay for at least the first 48hrs.)   Exterior Home Access Ramp;Steps  (ramp into the front of the building; 2 steps through the back. Elevator up to pt's floor.)   [employer]   Strengths   Strengths Premorbid level of function;Support of immediate family;Home design;Ability to acquire knowledge   Barriers   Barriers Comorbidities   Cognition   Orientation Level Oriented x4   RUE Assessment   RUE Assessment WFL   LUE Assessment   LUE Assessment WFL   RLE Assessment    RLE Assessment WFL   LLE Assessment   LLE Assessment WFL   Mobility / Balance   Mobility / Balance Yes   Bed Mobility   Supine to Sit Close supervision  (pt used B UE to lift RLE off of bed, noting that the leg feels heavy)   Sit to Supine Independent;Mod assist to right 1   Transfers   Transfer Yes   Transfer 1   Transfer From 1 Sit   Transfer Type 1 To and from   Transfer to 1 Stand   Technique 1 Sit to stand;Stand to sit   Transfer Device 1 Rolling walker (Front wheel walker)   Transfer Level of Assistance 1 Close supervision;Contact guard   Trials/Comments 1 2   Gait   Gait Yes   Gait 1   Assistive Device 1 Rolling walker (Front wheel walker)   Pattern 1 R Decreased stance time;Decreased stride length   Gait Assistance 1 Contact guard;Mod verbal cues;Setup  (instructed in step-to gait to maximize safety and op leg deloading potential)   Distance (Ft) 1 30 Feet  (pt limited by pain)   Activity Tolerance   Activity Tolerance Endurance does not limit participation  (pt limited by pain)   Balance   Sitting - Static Sits without support for > 30 sec;Feet  unsupported   Sitting - Dynamic Bilateral upper extremity supported   Standing - Static Bilateral upper extremity supported   Standing - Dynamic Forward lean   Coordination   Gross Motor Performance Observation Tense  (antalgia)   Plan   Prognosis Good   PT Frequency 5x/wk   Recommendation   Recommendation Home PT;24 hour supervision/assist  (Pt states that she will have 24/7 assist at home, at least for a few days.)       A: Pt is a 68 year old female s/p right knee extensor mechanism reconstruction, ROH and conversion to constrained TKA with Dr. Lorel Monaco. See orthopedic notes for full, subacute orthopedic history.  Pt placed in long-leg cast this AM and is WBAT in cast with RW. Pt's functional mobility, activity tolerance, safety awareness evaluated. The pt required a 1 assist for basic mobility tasks and demonstrated only a fair tolerance of activity, limited by  severe pain. Pt did not ambulate the required distance to enter her home. However, pt does demonstrate the ability to negotiate household distances. Pt was given pain meds prior to this evaluation and pt understands that her pain is not guaranteed to significantly improve prior to or just after discharge. With pt, plan was made for pt's family to acquire a wheelchair ASAP and for pt to return home (Elevator once inside).  Family/friends' 24/7 assistance for as long as needed.  Home PT required. Patient would benefit from skilled PT to address mobility impairments and functional limitations. Recommend home with 24 care and home PT upon d/c.    PT Short Term Goals:  Patient will perform bed mobility independently in 3 treatments.  Patient will perform sit<>stand transfers with supervision in 2 treatments.  Patient will ambulate 100 feet with supervision with rolling walker (front wheel walker) in 4 treatments.    PT Long Term Goals:  Patient will perform sit<>stand transfers independently in 5 treatments.  Patient will ambulate 150 feet with independently with rolling walker (front wheel walker) in 7 treatments.    P: PT 5 times a week while inpatient for transfer training, gait training, and therapeutic exercise as tolerated.    Jannet Askew, PT, Lic # [id number]

## 2023-05-20 NOTE — Progress Notes (Signed)
Patient takes Stiolto at home,and is familiar with the respimat devices and technique.  Refused  Spiriva and would only take one puff of the Striverdi, stating her breathing was fine and she takes her meds at home when needed.  Patient informed of maintenance drugs and need to take as ordered, she states she is aware.

## 2023-05-20 NOTE — Progress Notes (Signed)
Long leg fiberglass cast W/ window  applied by me under the direction of [employer] PA-C    C/S/M normal after cast applied.    Cast/splint care Instructions given to and reviewed with patient.  Patient informed to call office if any pain or swelling occur.    Geryl Rankins, 05/20/2023

## 2023-05-21 DIAGNOSIS — Z96651 Presence of right artificial knee joint: Secondary | ICD-10-CM

## 2023-05-21 DIAGNOSIS — S86811D Strain of other muscle(s) and tendon(s) at lower leg level, right leg, subsequent encounter: Secondary | ICD-10-CM | POA: Diagnosis not present

## 2023-05-21 DIAGNOSIS — S86811A Strain of other muscle(s) and tendon(s) at lower leg level, right leg, initial encounter: Secondary | ICD-10-CM | POA: Diagnosis not present

## 2023-05-21 DIAGNOSIS — T84092A Other mechanical complication of internal right knee prosthesis, initial encounter: Secondary | ICD-10-CM | POA: Diagnosis not present

## 2023-05-21 LAB — CBC WITH PLATELET
ABSOLUTE NRBC COUNT: 0 10*3/uL (ref 0.0–0.0)
HEMATOCRIT: 29.9 % — ABNORMAL LOW (ref 34.1–44.9)
HEMOGLOBIN: 8.8 g/dL — ABNORMAL LOW (ref 11.2–15.7)
MEAN CORP HGB CONC: 29.4 g/dL — ABNORMAL LOW (ref 31.0–37.0)
MEAN CORPUSCULAR HGB: 25.1 pg — ABNORMAL LOW (ref 26.0–34.0)
MEAN CORPUSCULAR VOL: 85.4 fL (ref 80.0–100.0)
MEAN PLATELET VOLUME: 9.9 fL (ref 8.7–12.5)
NRBC %: 0 % (ref 0.0–0.0)
PLATELET COUNT: 187 10*3/uL (ref 150–400)
RBC DISTRIBUTION WIDTH STD DEV: 44.5 fL (ref 35.1–46.3)
RED BLOOD CELL COUNT: 3.5 M/uL — ABNORMAL LOW (ref 3.90–5.20)
WHITE BLOOD CELL COUNT: 9.7 10*3/uL (ref 4.0–11.0)

## 2023-05-21 MED ORDER — HYDROCORTISONE 2.5 % EX CREA
TOPICAL_CREAM | Freq: Two times a day (BID) | CUTANEOUS | 1 refills | Status: AC
Start: 2023-05-21 — End: 2023-07-21

## 2023-05-21 MED ORDER — CEFADROXIL 1 G PO TABS
1.0000 g | ORAL_TABLET | Freq: Two times a day (BID) | ORAL | 0 refills | Status: AC
Start: 2023-05-21 — End: 2023-06-04

## 2023-05-21 MED ORDER — CELECOXIB 200 MG PO CAPS
200.0000 mg | ORAL_CAPSULE | Freq: Every day | ORAL | 0 refills | Status: DC
Start: 2023-05-21 — End: 2023-06-19

## 2023-05-21 MED ORDER — ACETAMINOPHEN 500 MG PO TABS
1000.0000 mg | ORAL_TABLET | Freq: Three times a day (TID) | ORAL | 0 refills | Status: DC | PRN
Start: 2023-05-21 — End: 2023-06-19

## 2023-05-21 MED ORDER — HYDROMORPHONE HCL 2 MG PO TABS
2.0000 mg | ORAL_TABLET | ORAL | 0 refills | Status: DC | PRN
Start: 2023-05-21 — End: 2023-05-21

## 2023-05-21 MED ORDER — HYDROMORPHONE HCL 4 MG PO TABS
4.0000 mg | ORAL_TABLET | Freq: Four times a day (QID) | ORAL | 0 refills | Status: DC | PRN
Start: 2023-05-21 — End: 2023-05-28

## 2023-05-21 MED ORDER — ASPIRIN 81 MG PO CHEW
81.0000 mg | CHEWABLE_TABLET | Freq: Two times a day (BID) | ORAL | 0 refills | Status: DC
Start: 2023-05-21 — End: 2023-08-25

## 2023-05-21 NOTE — Op Note (Addendum)
Procedure(s):  REVISION, TOTAL ARTHROPLASTY, KNEE, ALL COMPONENTS  REPAIR, TENDON, PATELLAR Operative Note     Date: 05/19/2023  Location: [facility] OR    Name: Cheryl Dixon, DOB: 27-Mar-1956, MRN: [id number]    Diagnosis  Pre-op Diagnosis      * Re-rupture patellar tendon, right, subsequent encounter s/p total knee arthroplasty Post-op Diagnosis      * Re-rupture patellar tendon, right, subsequent encounter s/p total knee arthroplasty     PROCEDURE:    1. Right total knee arthroplasty revision, all components-Complex  2. Right knee extensor mechanism reconstruction-Complex   3. Right patellectomy  4. Removal of hardware-deep  5.  Application of negative pressure wound therapy incisional VAC dressing    Surgeons and Role:     [name], MD - Primary    Procedure Summary  Anesthesia: General  ASA: ASA 3  Estimated Blood Loss: 75 (units unknown)  Total IV Fluids: 2000 mL  Drains:  Peripheral IV 05/21/23 Right Antecubital fossa (Active)   Site Assessment Clean;Dry;Intact 05/21/23 0530   Line Status Infusing 05/21/23 0530   Dressing Status Clean;Dry;Intact 05/21/23 0530       [REMOVED] Peripheral IV 05/19/23 Left Hand (Removed)   Site Assessment Clean;Dry;Intact 05/20/23 0800   Line Status No blood return;Flushed;Saline locked 05/20/23 0800   Dressing Status Clean;Dry;Intact 05/20/23 0800   Dressing Change Due (If No then N/A) 05/26/23 05/20/23 0800      Specimens:   Order Name Source Comment Collection Info Order Time   CBC WITH PLATELET    05/20/2023 12:05 AM     Release result to patient via [facility]   Immediate        BASIC METABOLIC PANEL    05/20/2023 12:05 AM     Release result to patient via [facility]   Immediate           Implant(s):   Implant Name Type Inv. Item Serial No. Manufacturer Lot No. LRB No. Used Action   [facility] BONE CEMENT - [id number]  [facility] BONE CEMENT  STRYKER [id number] Right 1 Implanted   [facility] BONE CEMENT - [id number]  [facility] BONE CEMENT  STRYKER [id number] Right 1 Implanted   [facility] BONE  CEMENT - [id number]  [facility] BONE CEMENT  STRYKER [id number] Right 1 Implanted   [facility] BONE CEMENT - [id number]  [facility] BONE CEMENT  STRYKER [id number] Right 1 Implanted   10[employer] STIMULAN RAPID CURE - [id number]  10CC STIMULAN RAPID CURE  BIOCOMPOSITE [id number] Right 1 Implanted   10CC STIMULAN RAPID CURE - [id number]  10CC STIMULAN RAPID CURE  BIOCOMPOSITE [id number] Right 1 Implanted   MESH [facility] PP MACRO 30X30X1 - [id number]  MESH SYNTH PP MACRO 30X30X1  [employer] [id number] Right 1 Implanted   UNIV CEMENT RESTRICTOR - [id number]  UNIV CEMENT RESTRICTOR  STRYKER [id number] Right 1 Implanted   STRYKER REVISIONUNIVERSAL CEMENT RESTRICTOR- DISPOSABLE INSERTER    STRYKER [id number] Right 1 Implanted   Triathlon Total Knee Cemented Stem 50mm    STRYKER [id number] Right 1 Implanted   TRIATHLONG CEMENT STEM 15X100M - [id number]  TRIATHLONG CEMENT STEM 15X100M  STRYKER [id number] Right 1 Implanted   TRI SYM CONE AUGMENT SIZE 8 - [id number]  TRI SYM CONE AUGMENT SIZE 8  STRYKER [id number] Right 1 Implanted   Triathlon Revision Tibial Baseplate #3    STRYKER [id number] Right 1 Implanted   Triathlon Hinge Femoral Component sze #4 RT HINGE    STRYKER [id number] Right 1 Implanted   Triathlon Hinge Bumper deg 0  STRYKER [id number] Right 1 Implanted   Triathlon Hinge Bushings and Axle sze STD    STRYKER [id number] Right 1 Implanted   Triathlon Hinge Tibial Bearing Component sze 3-4    STRYKER [id number] Right 1 Implanted   Triathlon Hinge Insert sze 3, thkns 11mm, typ HINGE    STRYKER [id number] Right 1 Implanted     Staff:   Circulator: Tyler Pita, RN; Ramiro Harvest, RN  Surgical Tech: Olin Pia  Relief Circulator: Wynelle Beckmann, RN  PA: Myer Haff, PA-C  Resident: Carlyle Dolly, MD    Indications: [name] is an 68 year old female who is having surgery for Rupture patellar tendon, right, subsequent encounter [S86.811D].    Procedure Details:  COMPONENTS EXPLANTED:  [name] cemented CR Femoral component size 4  [name]  universal baseplate size 4  Stryker CS Tibial Insert size 9 mm     COMPONENTS IMPLANTED:       1. [name] Tritanium Tibial Symmetric Cone Augment size B   2. Stryker Artisan Bone Plug Cemented size small, 18.5 mm diameter   3. [name] Revision Tibial Baseplate size 3  4. [name] Total Knee Cemented Stem (Tibia) 9 mm diameter 50 mm length  5. Stryker Artisan Bone Plug Cemented size medium, 25 mm diameter   6. [name] Hinge Femoral component size 4, right, Hinge   7. [name] Chiropodist Component, size 3-4  7. [name] Hinge Bumper 0 degree  8. Arts administrator and Axle  9. [name] Total Knee Cemented Stem (Femur), 15 mm diameter, 100 mm length  10. [name] Hinge Insert Insert size 3, 11 mm thickness, Hinge  11. Simplex P with tobramycin bone cement (40 g) x4  12. Stimulan calcium sulphate 20 cc with vancomycin and tobramycin  13. 30 cm x 30 cm Polypropylene Mesh       DESCRIPTION OF PROCEDURE:  We prepared the synthetic polypropylene mesh to be utilized for the extensor mechanism reconstruction on the back table.  A 12 x 12 inch sheet of polypropylene mesh was rolled onto itself 8 times and sewn together with nonabsorbable #5 ethibond with a running-locked closure.     The patient was brought to the Operating Room in stable condition.  She was positioned supine on the operating table.  A general anaesthetic was administered.  Residual non-absorbable sutures were removed from the prior incision.  Foley catheter inserted.  Bony prominences were well padded.  The right lower extremity was prepped and draped in a sterile fashion.  Surgical timeout was performed.  IV antibiotics and TXA were given.       Sterile tourniquet was applied to the proximal right thigh.  We landmarked out an anterior longitudinal incision over the knee over the previous surgical scar.  Full thickness medial and lateral flaps were  developed which included fascia.  The extensor mechanism was exposed.  The patellar tendon was attenuated and ruptured inferior to the patella.  A medial parapatellar arthrotomy was performed.  We performed a deep MCL release to the mid coronal plane of the tibia.      The cable from the previous patellar tendon repair was cut and removed.  Previous ethibond suture was removed.    We excised soft tissue/synovium from the medial and lateral gutters, the anterior distal femur and from around the patella.  Synovium was sent to Microbiology for culture.  All components were fixed and well positioned.  The tibial insert  was removed using a  inch osteotome.     We flexed the knee and everted the patella.   The femoral component was solidly fixed. Using a combination of a small oscillating saw and narrow osteotome we cut around the anterior flange and femoral condyles at the implant-cement interface trying to preserve as much bone stock as possible.  The femoral component was removed with a punch and mallet.       The tibia was subluxed forward and retractors were appropriately positioned. The tibial baseplate was solidly fixed.   Using a combination of a small oscillating saw and reciprocating saw we cut around the tibial baseplate at the implant-cement interface trying to preserve as much bone stock as possible.  We used the Shukla tibial component extractor to remove the tibial component.       Residual cement was removed from the femoral and tibial surfaces and tibial intramedullary canal using a 1/4 inch osteotome and rongeur.  Soft tissue was excised from the femoral and tibial surfaces.    The tibial canal was then sequentially reamed to 13 mm to the desired stem depth.  The 13 mm reamer was left in place.  A clean-up cut of the tibial surface was performed.  We then used the tibial symmetric cone reamer over the intramedullary reamer and reamed to a size B. The knee spacer confirmed adequate extension gap.      We  trialed with the tibial symmetric cone trial confirming adequate fit. We pinned the size 3 tibial baseplate trial in adequate rotation and drilled and punched the tibia.   The size 3 tibial template matched the size of the cut proximal tibial surface.  We attached the trial stem to the trial tibial baseplate and impacted it in place.     The knee spacer confirmed adequate extension gap again.    The tourniquet was deflated at this poitn (90 min)    We turned our attention to the femur.  The femoral intramedullary canal was sequentially reamed to 17 mm to the desired stem depth.  The revision distal resection guide was assembled onto the resection guide tower and this was then slid over the intramedullary reamer.  The bladerunner was used to align the revision distal resection guide with the medial epicondyle in order to recreate the joint-line.  A clean-up distal femoral resection was completed. The femur was sized to a size 4.  The size 4 all-in-one resection guide was slid over the intramedullary reamer and ensured that the resection guide was parallel to the tibial baseplate.  The resection guide was pinned in place setting the femoral rotation.  We again confirmed that the resection guide was parallel to the tibial baseplate.  The anterior and anterior and posterior chamfer cuts were made.  We then assembled the size 4 femoral trial onto the femoral stem trial and impacted it into place.  The tibial bearing component trial and axle were placed. We trialed with a 13 mm trial insert.  The knee was brought into full extension.  The knee was taken through a ROM and tracking was good.        All trial components were removed.  The patellar component was cut off.      The tourniquet was re-inflated.    We inserted cement restrictors at the desired depth into the tibial and femoral canals and then irrigated the knee with NS.       We placed the definitive cone augment into the  proximal tibia. The intramedullary canal  was then filled with cement.  One end of the synthetic polypropylene mesh was cemented into the intramedullary position within the cone at a depth of 5 cm and the assembled definitive tibial baseplate with tibial stem was impacted into the intramedullary canal.  Excess cement was removed.  The cement was allowed to harden.       The femoral intramedullary canal was filled with cement.  The assembled definitive femoral component with femoral stem was impacted into the intramedullary canal.  Excess cement was removed. The hinge bushings were placed. The definitive hinge insert was impacted.  The hinge tibial bearing component was placed into the tibial component and connected to the femoral component by way of the axle.  The knee was brought into extension. The cement was allowed to harden.      The knee had full extension and flexion to beyond 90 degrees.    A patellectomy was performed.     Diluted sterile betadine wash was performed.  The knee was thoroughly irrigated again.    Stimulan calcium sulphate beads with vancomycin and tobramycin were placed deep around the prosthetic knee.     The synthetic mesh was then passed through a distal tunnel and incorporated with the remaining aspects of the host patellar tendon.  The vastus medialis and vastus lateralis were mobilized both dorsally and ventrally, which allowed the mesh to be joined to the vastus lateralis proximally. The vastus medialis was then closed over the mesh in a classic "pants-over-vest" fashion. #5 ethibond was used.     The wound was closed in layers using #1 Stratafix for the medial parapatellar arthrotomy and subcutaneous layer. We used 2-0 Stratafix  for the subdermal layer. Skin was closed with 2-0 nylon suture.  A Prevena incisional wound VAC was applied.  The lower extremity was wrapped with an ACE bandage and a knee immobilizer was applied.     The patient's general anaesthetic was reversed and she was was awoken and transferred to her  hospital bed and transported to PACU in stable condition.      The procedure was complex due to the pre-existing instability from subacute extensor mechanism disruption which necessitated extensor mechanism reconstruction with synthetic mesh and revision total knee arthroplasty to hinge prosthesis to provide anterior restraint and protect the reconstruction.  This prolonged surgical time and increased blood loss.     POST-PROCEDURE PLAN:  The patient will receive IV antibiotics while in hospital and then complete a 14-day course of extended oral antibiotics for prophylaxis upon discharge.  DVT/PE prophylaxis starting post-op day #1.   Transition to a full leg cast with knee in near full extension.  She will be WBAT with the knee in full extension in the cast and mobilize with physiotherapy.  Cast change and Prevena VAC dressing to be removed on post-op day #14.  Anticipate discharge when postoperative goals are met.       I was present and scrubbed for the entire procedure and performed all critical portions.    Myer Haff (PA-C) was the surgical assistant assisting with patient positioning, surgical exposure/retraction, cementation of the components and surgical wound closure         Preoperative antibiotics have been ordered and given within 1/4 hours of incision. And re-dosed at 3 hours intra-op.  Venous thrombosis prophylaxis have been ordered including unilateral sequential compression device    Findings: patellar tendon rupture    Complications:  None; patient tolerated the procedure  well.     Disposition: PACU - hemodynamically stable.  Condition: stable    [name], MD  Phone Number: [phone]

## 2023-05-21 NOTE — Progress Notes (Signed)
Orthopedic Surgery Progress Note      POD #2 s/p Right patella tendon repair    Subjective:  AVSS. She denies chest pain, shortness of breath, nausea/vomiting, dizziness or distal numbness or tingling. Her pain controlled and tolerable. No reported overnight events.    Laying in bed with long leg cast and Prevena to continuous suction.    Objective:  Medications:    tiotropium  2 puff Inhalation Daily with lunch    And    olodaterol  2 puff Inhalation Daily with lunch    oxybutynin  5 mg Oral BID    docusate sodium  100 mg Oral TID    sennosides  17.2 mg Oral BID    pantoprazole  40 mg Oral Daily    multivitamin  1 tablet Oral Daily    ketorolac  15 mg Intravenous Q8H SCH    celecoxib  200 mg Oral Daily    acetaminophen  1,000 mg Oral Q8H SCH    aspirin  81 mg Oral BID    atorvastatin  80 mg Oral Daily after dinner    buprenorphine-naloxone  12 mg Sublingual QAM    And    buprenorphine-naloxone  8 mg Sublingual Daily with lunch    And    buprenorphine-naloxone  8 mg Sublingual Nightly    buPROPion  200 mg Oral Daily    ceFAZolin  2 g Intravenous Q8H [facility]     Labs:  CBC:   Recent Labs     05/20/23  0745 05/21/23  0750   WBC 14.8* 9.7   HCT 29.7* 29.9*   HGB 9.0* 8.8*   PLTA 218 187   RBC 3.54* 3.50*     BMP:   Recent Labs     05/20/23  0745   NA 135*   K 4.2   CL 99   CO2 27   BUN 12   CREAT 0.9   GLUCOSER 102     Coagulation Labs: No results for input(s): "PT", "PTT", "INR" in the last 72 hours.  LFTs: No results for input(s): "AST", "ALT", "TBILI", "DBILI", "ALKPHOS" in the last 72 hours.  Intake and Output:  I/O 24 Hrs:  In: 200 [P.O.:200]  Out: 700 [Urine:700]    Physical Examination:  Vital Signs: BP 153/77   Pulse 78   Temp 98.4 F (36.9 C) (Oral)   Resp 18   Ht 5\' 1"  (1.549 m)   Wt 93 kg (205 lb)   LMP 11/20/1991   SpO2 95%   BMI 38.73 kg/m   Gen: Alert and oriented x 3. No acute distress.  HEENT: Atraumatic, normocephalic. Conjunctivae clear. Mucous membranes moist.  Neck: Trachea midline and no  noticeable JVD  Pulm: Non-labored breathing and does not appear short of breath  Musculoskeletal: RLE: Long leg cast clean, dry and intact. Window with Prevena dressing to suction. Gross sensation intact above and below cast. Toes warm and well perfused. 2+ DP  Neuro: Normal mentation and speech.    Assessment/Plan:  This is a 68 year old year old English speaking female who is POD 2 s/p right patella tendon repair. Doing well from Orthopedic perspective. Has walker at bedside.  # Wound - Keep Prevena VAC dressing in place until follow up 06/10/23. Need to switch to a 2 week pump after the present one dies.  # Heme - Hct 29.9, asymptomatic, continue to monitor  # Antibiotics - Duricef 1G x14 days  # Analgesia - Multi-modal pain regimen and ice  as tolerated  # Activity - As tolerated, WBAT LLE.  Please use your walker as directed by PT/OT.  # Anticoagulation - Aspirin 81mg  twice daily x4 weeks.   # Bowels: Encourage fluid intake. Bowel meds as directed  # Prophalaxis - Encourage IS, Venodynes (calf/ankle) and OOB with PT as tolerated  # Followup - Orthopedic outpatient clinic 06/10/2023 with Dr. Lorel Monaco.  # Dispo - Home today. PT/OT will collaborate to determine a safe discharge plan.    I spent a total of 60 minutes on this visit on the date of service (total time includes all activities performed on the date of service).     I reviewed the likely diagnosis, the prognosis, and various treatment options in detail. Risks and benefits of treatment plan discussed. The patient's questions have been answered, and the patient understands agrees with treatment plan.     Please page the Orthopedic On-call person in StaffNet with questions.       Rebecca Eaton, PA-C  Beeper [phone]

## 2023-05-21 NOTE — Telephone Encounter (Signed)
Second call placed,reached patient.  Attempted to relay below messages,pt states she is unaware of this ( daughter called inquiring about meals on the wheels).  Reviewed with patient,verbalizes understanding,states she will let her daughter know.          Call attempted to daughter,unable to reach,left message to return call.    She should call [facility] and ask to be included in the program and they send me a form for what type of meals they can bring.

## 2023-05-21 NOTE — Progress Notes (Signed)
Inpatient Physical Therapy Treatment Note  S: "I'm doing much better than yesterday."   O: Please see Vital Sign and Pain Assessment flowsheets for vitals and pain documentation.  Plan of care has been reviewed and updated as necessary.     05/21/23 1008   Language Information   Language Needs Met By: Lenox Ponds Used Effectively By Patient   Rehab Discipline   Rehab Discipline PT   Weight Bearing Status   RLE WBAT  (WBAT in long leg cast; NWB when in Bledsoe brace)   LLE FWB   RUE FWB   LUE FWB   Precautions   Total Knee Replacement Precaution   (long leg cast)   Functional Standing Tolerance   Time   Activity standing, walking   Comments improved tolerance compared to yesterday   Mobility / Balance   Mobility / Balance Yes   Bed Mobility   Supine to Sit Independent   Sit to Supine Independent   Rolling Independent   Transfers   Transfer Yes   Transfer 1   Transfer From 1 Sit   Transfer Type 1 To and from   Transfer to 1 Stand   Technique 1 Sit to stand;Stand to sit   Transfer Level of Assistance 1 Supervision   Gait   Gait Yes   Gait 1   Assistive Device 1 Rolling walker (Front wheel walker)   Pattern 1 R Decreased stance time;Decreased stride length   Distance (Ft) 1 50 Feet  (pt limited by pain)   Activity Tolerance   Activity Tolerance Endurance does not limit participation  (pt limited by pain)   Balance   Sitting - Static Sits without support for > 30 sec;Feet unsupported   Sitting - Dynamic Bilateral upper extremity supported   Standing - Static Bilateral upper extremity supported   Standing - Dynamic Forward lean   Safety Devices   Type of Devices Call bell in place   Plan   Prognosis Good   PT Frequency 2x/wk   Recommendation   Recommendation Home PT;24 hour supervision/assist       A: Pt encountered resting in bed, appearing attentive, in NAD. Agreeable to PT. Pt with improved performance of functional mobility tasks today with less pain. Pt ambulated a household distance, continues to self-limit 2/2  pain. Pt's family acquired a wheelchair. Pt has a safe discharge plan, from a PT perspective. Home PT suggested to ensure home environment is optimized and to assess mobility, safety in that environment. [place] PT to decrease frequency now, while she remains with [place] at [facility].   Recommend home PT upon d/c.   P: Continue per PT Care Plan.    Jannet Askew, PT, Lic # [id number]

## 2023-05-21 NOTE — Progress Notes (Signed)
Patient s/b ortho and cleared for discharge.  Discharge instructions, medications, and follow up appointments reviewed with patient.  Patient discharged home at 2:30pm with daughter via w/c.

## 2023-05-21 NOTE — Care Coordination (Signed)
Pt discussed in MDR. Per team pt is medically clear for DC today, plan change current wound vac to a new one then DC home. CM met with pt at bedside, agreeable to DC home family will transport. CM spoke with Lennox Laity liaison at [facility] [phone] accepted pt to resume services.     05/21/23 1157   Discharge Reviewed   Reviewed for discharge Yes - CM Discharge   Home care referral accepted Yes   UR Completed? Yes   Final Discharge Plan    Was this a  3 day waiver? No   Discharge disposition Home   Home Home w/ VNA services (06)   Lives with Other Family   VNA All - Care VNA (06)   Transportation at Discharge Family   Equipment Recommended Rolling walker (Front wheel walker)   IM from medicare delivered N/A   Financial Assistance Pending Not applicable   Caregiver   Does the patient have a caregiver? Yes   Caregiver's name Family   Discharge phone call to caregiver Other (comment)  (Patient will contact)

## 2023-05-21 NOTE — Plan of Care (Signed)
Patient alert and oriented, afebrile, vss. LS clear, BS present. RLE elevated on pillow with +CSM. Surgical managed by scheduled Tylenol, Toradol and PRN PO Dilaudid. IV Cefazolin infused as ordered and tolerated well. Slept on and off, no acute distress noted, safety maintained.  Problem: Activity:  Goal: Mobility will be supported throughout the hospitalization,  Outcome: Progressing
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
## 2023-05-25 ENCOUNTER — Other Ambulatory Visit (HOSPITAL_BASED_OUTPATIENT_CLINIC_OR_DEPARTMENT_OTHER): Payer: Self-pay | Admitting: Internal Medicine

## 2023-05-25 ENCOUNTER — Other Ambulatory Visit: Payer: Self-pay

## 2023-05-25 ENCOUNTER — Emergency Department
Admission: EM | Admit: 2023-05-25 | Discharge: 2023-05-25 | Disposition: A | Discharge status: Home / Self Care | Payer: No Typology Code available for payment source | Attending: Emergency Medicine | Admitting: Emergency Medicine

## 2023-05-25 ENCOUNTER — Telehealth (HOSPITAL_BASED_OUTPATIENT_CLINIC_OR_DEPARTMENT_OTHER): Payer: Self-pay | Admitting: Registered Nurse

## 2023-05-25 DIAGNOSIS — M25562 Pain in left knee: Secondary | ICD-10-CM | POA: Diagnosis not present

## 2023-05-25 DIAGNOSIS — Z96652 Presence of left artificial knee joint: Secondary | ICD-10-CM | POA: Diagnosis not present

## 2023-05-25 DIAGNOSIS — G8918 Other acute postprocedural pain: Secondary | ICD-10-CM | POA: Insufficient documentation

## 2023-05-25 LAB — BASIC METABOLIC PANEL
ANION GAP: 10 mmol/L (ref 10–22)
BUN (UREA NITROGEN): 13 mg/dL (ref 7–18)
CALCIUM: 9.2 mg/dL (ref 8.5–10.5)
CARBON DIOXIDE: 27 mmol/L (ref 21–32)
CHLORIDE: 101 mmol/L (ref 98–107)
CREATININE: 0.8 mg/dL (ref 0.4–1.2)
ESTIMATED GLOMERULAR FILT RATE: >60 mL/min (ref >60)
Glucose Random: 107 mg/dL (ref 74–160)
POTASSIUM: 4.4 mmol/L (ref 3.5–5.1)
SODIUM: 138 mmol/L (ref 136–145)

## 2023-05-25 LAB — CBC, PLATELET & DIFFERENTIAL
ABSOLUTE BASO COUNT: 0.1 10*3/uL (ref 0.0–0.1)
ABSOLUTE EOSINOPHIL COUNT: 0.2 10*3/uL (ref 0.0–0.8)
ABSOLUTE IMM GRAN COUNT: 0.15 10*3/uL — ABNORMAL HIGH (ref 0.00–0.10)
ABSOLUTE LYMPH COUNT: 1.2 10*3/uL (ref 0.6–5.9)
ABSOLUTE MONO COUNT: 0.7 10*3/uL (ref 0.2–1.4)
ABSOLUTE NEUTROPHIL COUNT: 5.2 10*3/uL (ref 1.6–8.3)
ABSOLUTE NRBC COUNT: 0 10*3/uL (ref 0.0–0.0)
BASOPHIL %: 0.7 % (ref 0.0–1.2)
EOSINOPHIL %: 2.5 % (ref 0.0–7.0)
HEMATOCRIT: 24.5 % — ABNORMAL LOW (ref 34.1–44.9)
HEMOGLOBIN: 7.5 g/dL — ABNORMAL LOW (ref 11.2–15.7)
IMMATURE GRANULOCYTE %: 2 % — ABNORMAL HIGH (ref 0.0–1.0)
LYMPHOCYTE %: 16.3 % (ref 15.0–54.0)
MEAN CORP HGB CONC: 30.6 g/dL — ABNORMAL LOW (ref 31.0–37.0)
MEAN CORPUSCULAR HGB: 25.3 pg — ABNORMAL LOW (ref 26.0–34.0)
MEAN CORPUSCULAR VOL: 82.8 fL (ref 80.0–100.0)
MEAN PLATELET VOLUME: 8.8 fL (ref 8.7–12.5)
MONOCYTE %: 8.7 % (ref 4.0–13.0)
NEUTROPHIL %: 69.8 % (ref 40.0–75.0)
NRBC %: 0 % (ref 0.0–0.0)
PLATELET COUNT: 335 10*3/uL (ref 150–400)
RBC DISTRIBUTION WIDTH STD DEV: 42.7 fL (ref 35.1–46.3)
RED BLOOD CELL COUNT: 2.96 M/uL — ABNORMAL LOW (ref 3.90–5.20)
WHITE BLOOD CELL COUNT: 7.5 10*3/uL (ref 4.0–11.0)

## 2023-05-25 LAB — HOLD BLUE TOP TUBE

## 2023-05-25 LAB — RBC SEDIMENTATION RATE: RBC SEDIMENTATION RATE: 116 mm/h (ref 0–30)

## 2023-05-25 LAB — C-REACTIVE PROTEIN: C-REACTIVE PROTEIN: 32 mg/L — ABNORMAL HIGH (ref 0–18)

## 2023-05-25 MED ORDER — HYDROMORPHONE HCL 2 MG PO TABS
2.0000 mg | ORAL_TABLET | Freq: Four times a day (QID) | ORAL | 0 refills | Status: DC | PRN
Start: 2023-05-25 — End: 2023-05-25

## 2023-05-25 MED ORDER — OXYCODONE HCL 5 MG PO TABS
5.0000 mg | ORAL_TABLET | ORAL | 0 refills | Status: AC | PRN
Start: 2023-05-25 — End: 2023-06-06

## 2023-05-25 MED ORDER — HYDROMORPHONE HCL 1 MG/ML IJ SOLN (SUPER ERX)
0.5000 mg | Freq: Once | Status: AC
Start: 2023-05-25 — End: 2023-05-25
  Administered 2023-05-25: 0.5 mg via INTRAVENOUS
  Filled 2023-05-25: qty 0.5

## 2023-05-25 NOTE — Discharge Instructions (Addendum)
You are seen in the emergency department for pain in the leg after cast application    Here the orthopedic team puts Electrolytes down the sides of your cast to give your leg more space to breathe.    Please follow-up with them for reevaluation and repeat blood work on Friday.    If you have any change with severe pain or decree sensation in the foot before then please return to the ER.

## 2023-05-25 NOTE — Narrator Note (Signed)
Pt called TW into room stating that she got in touch with her surgeon. Her son wants to come pick pt up and she wishes to leave to see her provider.

## 2023-05-25 NOTE — Narrator Note (Signed)
Patient Disposition  Patient education for diagnosis, medications, activity, diet and follow-up.  Patient left ED 2:28 PM.  Patient rep received written instructions.    Interpreter to provide instructions: No    Patient belongings with patient: YES    Have all existing LDAs been addressed? N/A    Have all IV infusions been stopped? N/A    Destination: Discharged to home

## 2023-05-25 NOTE — ED Triage Note (Signed)
68 y.o. female BIBA from home. Pt is s/p total knee replacement 05/19/23. Cast on R leg, with wound vac tubing extending from opening around knee. Pt reports pain increased above and below incision yesterday and cast feels tight. CSM intact distally. Toes are warm to touch and pt is able to move them. Denies fevers. Administered dilaudid and Naproxen at 10am with no relief.     Pt states she also started noticing sweliing in L lower leg since yesterday. Denies Hx of CHF.

## 2023-05-25 NOTE — Telephone Encounter (Signed)
PER Patient (self), Cheryl Dixon is a 68 year old female has requested a refill of   oxyCODONE (ROXICODONE) 5 MG immediate release tablet .  Start Date: 04/27/23 End Date: 05/04/23     **PATIENT PREFERS OXYCODONE OVER DILAUDID. WOULD LIKE TO KNOW IF A PRESCRIPTION FOR OXYCODONE 10MG  COULD BE SENT INSTEAD AS THIS IS HER SECOND  SURGERY FOR TOTAL KNEE REPLACEMENT**    Last Office Visit: 05/13/2023    Other Med Adult:  Most Recent BP Reading(s)  05/21/23 : 131/82        Cholesterol (mg/dL)   Date Value   91/47/8295 212     LOW DENSITY LIPOPROTEIN DIRECT (mg/dL)   Date Value   62/13/0865 151     HIGH DENSITY LIPOPROTEIN (mg/dL)   Date Value   78/46/9629 47     TRIGLYCERIDES (mg/dL)   Date Value   52/84/1324 119         THYROID SCREEN TSH REFLEX FT4 (uIU/mL)   Date Value   08/20/2022 4.100         No results found for: "TSH"    HEMOGLOBIN A1C (%)   Date Value   08/20/2022 5.6       No results found for: "POCA1C"      INR (no units)   Date Value   12/12/2021 1.1   02/16/2007 1.0 (L)   07/03/2006 < 1.0 (L)       SODIUM (mmol/L)   Date Value   05/20/2023 135 (L)       POTASSIUM (mmol/L)   Date Value   05/20/2023 4.2           CREATININE (mg/dL)   Date Value   40/01/2724 0.9       Documented patient preferred pharmacies:    [id number] - [place], [place] - [address]  Phone: [phone] Fax: [phone]

## 2023-05-25 NOTE — Telephone Encounter (Addendum)
Discussed with A Leahey PA  She suggests pt go to [facility]    I called pt back,  no answer  detailed message left on VM  Asking pt to go the [facility] instead of the ED,    I called Daughter Kennyth Arnold  She said her brother prob drove her to the [place] ED or [facility]  clinic  I discussed the message I left on her mothers VM  Asking if she can let her know to call Ortho back to set up an appt   Either this wed or thurs for a follow up,  She agreed    I will call [facility] and let them know of the above  I spoke with Elodia Florence RN  Letting him know of the above and pt may arrive for a cast issue,        ----- Message from Acie Fredrickson sent at 05/25/2023 10:15 AM EST -----  Regarding: ESPISITO pt  Contact: [phone]  Pt called had SURGERY 05/19/23 FOR REVISION, TOTAL ARTHROPLASTY, KNEE, ALL COMPONENTS REPAIR, TENDON, PATELLAR [[id number] ([facility])]      Pt saying had a cast put on,cast from hip down, foot is swelling up. Pt Needs some of this pressure off. Pt is headed to the ER now, wanted to fill ESPOSITO in. Please call and advise

## 2023-05-25 NOTE — Telephone Encounter (Signed)
Cheryl Dixon -- I got this refill request but also see she is in the ED right now.  While I am glad to coordinate pain med prescribing with you I will defer to you for this prescription in case there are other active post-op issues currently

## 2023-05-25 NOTE — ED Provider Notes (Signed)
[facility] EMERGENCY VISIT NOTE       Triage Note and Vitals     Triage Documentation       Vanessa Barbara, RN 05/25/2023 10:59                 68 y.o. female BIBA from home. Pt is s/p total knee replacement 05/19/23. Cast on R leg, with wound vac tubing extending from opening around knee. Pt reports pain increased above and below incision yesterday and cast feels tight. CSM intact distally. Toes are warm to touch and pt is able to move them. Denies fevers. Administered dilaudid and Naproxen at 10am with no relief.     Pt states she also started noticing sweliing in L lower leg since yesterday. Denies Hx of CHF.          ED Triage Vitals   ED Triage Vitals Brief Group      Temp 05/25/23 1048 98.1 F      Pulse 05/25/23 1048 81      Resp 05/25/23 1048 20      BP 05/25/23 1048 144/82      SpO2 05/25/23 1048 97 %      Pain Score 05/25/23 1206 7       Arrived to the ED by: [facility]    Past Medical History       has a past medical history of Anxiety, Arthritis, COPD (chronic obstructive pulmonary disease) (HCC), Depression, Obese, Stroke (HCC), Substance addiction (HCC), and Varicose veins of lower extremities.    She has no past medical history of Adverse effect of anesthesia, Malignant hyperthermia, PONV (postoperative nausea and vomiting), or Sleep apnea.  Patient Active Problem List:     Opioid dependence on agonist therapy (HCC)     Tobacco use disorder     Fibrocystic breast     Essential hypertension     Knee pain     Chronic low back pain     OA (osteoarthritis) of knee     H. pylori infection     Major depressive disorder, recurrent episode, severe (HCC)     Occult blood in stools     Prediabetes     Epigastric pain     Chronic obstructive pulmonary disease (HCC)     Left foot pain     Multiple polyps of colon     Acquired deformity of toe, right     Dislocation of metatarsophalangeal joint of lesser toe, right, sequela     Postmenopausal atrophic vaginitis     Abnormal loss of weight     Aneurysm of  ascending aorta without rupture (HCC)     COVID-19 virus infection     BMI 32.0-32.9,adult     Chronic dysuria     LLQ pain     Osteoporosis     History of endometriosis     Lung cancer screening     Constipation, drug induced     Weakness     Cerebrovascular accident (CVA) (HCC)     Overactive bladder      [facility] [phone] Option #4     Substance addiction (HCC)     Anxiety     Obese     S/P total knee arthroplasty, right     Patellar tendon rupture, right, initial encounter     Status post total right knee replacement     Rupture patellar tendon, right, subsequent encounter     Patellar tendon rupture, right, sequela  S/P revision of total knee, right      ED Visit Note     History provided by: Patient ***  Current medications/prescriptions reviewed.  Past medical visits, history and external records reviewed: ***    ED Course as of 05/25/23 1424   Mon May 25, 2023   2838 68 year old female with multiple prior surgeries for right patella tendon repair requiring right knee extensor mechanism reconstruction using mesh, removal of hardware and conversion to constrained total knee arthroplasty with Dr. Lorel Monaco with admission 05/19/2023 to 05/21/2023.  Patient presenting here stating that since she has been home she has been doing well with wound VAC.  Overnight she started to feel decree sensation and increased swelling in her right lower extremity.  Tells like her toes are not well-perfused.  Feels that her knee is much more swollen.  Has not had more output from her wound VAC.  States that her temperature this morning was 99 with no fever.  She is in a long-leg cast which she states is constraining.  She has not had any falls.  She denies any shortness of breath.  She has been on Dilaudid for pain.   1200 Orthopedics repaged.   1216 Discussed with orthopedics, cast saw at bedside.  Discussed concern for compartment syndrome, they will come evaluate patient.   1423 Discussed with  orthopedics, they bivalve the cast.  They are not concerned for any ongoing compartment syndrome, do not desire additional imaging such as DVT study or x-ray.  They are aware of her elevated inflammatory markers and we will repeat them on Friday.  Have discussed with patient and daughter that she should follow-up with her primary care doctor if she cannot get repeat labs on Friday in orthopedics clinic to make sure that her anemia is not downtrending.         Treatment significantly limited by the following social determinants of health: ***  CC time - none additional ***    Please be aware the text of this note was written with medical jargon meant to communicate with other clinicians. Voice recognition software was used to create this note. Please excuse any typos.     ED Medications     Given in the ER:  Medications   HYDROmorphone (DILAUDID) injection 0.5 mg (0.5 mg Intravenous Given 05/25/23 1206)       Given in the ER and written for discharge:  Orders Placed This Encounter      HYDROmorphone (DILAUDID) injection 0.5 mg      HYDROmorphone (DILAUDID) 2 MG tablet      ED Labs and Imaging      No orders to display    Results for orders placed or performed during the hospital encounter of 05/25/23 (from the past 24 hours)   CBC, Platelet & Differential    Collection Time: 05/25/23 12:08 PM   Result Value    WHITE BLOOD CELL COUNT 7.5    RED BLOOD CELL COUNT 2.96 (L)    HEMOGLOBIN 7.5 (L)    HEMATOCRIT 24.5 (L)    MEAN CORPUSCULAR VOL 82.8    MEAN CORPUSCULAR HGB 25.3 (L)    MEAN CORP HGB CONC 30.6 (L)    RBC DISTRIBUTION WIDTH STD DEV 42.7    PLATELET COUNT 335    MEAN PLATELET VOLUME 8.8    NEUTROPHIL % 69.8    IMMATURE GRANULOCYTE % 2.0 (H)    LYMPHOCYTE % 16.3    MONOCYTE % 8.7    EOSINOPHIL %  2.5    BASOPHIL % 0.7    NRBC % 0.0    ABSOLUTE NEUTROPHIL COUNT 5.2    ABSOLUTE IMM GRAN COUNT 0.15 (H)    ABSOLUTE LYMPH COUNT 1.2    ABSOLUTE MONO COUNT 0.7    ABSOLUTE EOSINOPHIL COUNT 0.2    ABSOLUTE BASO COUNT 0.1     ABSOLUTE NRBC COUNT 0.0   Basic Metabolic Panel    Collection Time: 05/25/23 12:08 PM   Result Value    SODIUM 138    POTASSIUM 4.4    CHLORIDE 101    CARBON DIOXIDE 27    ANION GAP 10    CALCIUM 9.2    Glucose Random 107    BUN (UREA NITROGEN) 13    CREATININE 0.8    ESTIMATED GLOMERULAR FILT RATE > 60   RBC Sedimentation Rate    Collection Time: 05/25/23 12:08 PM   Result Value    RBC SEDIMENTATION RATE 116 (*H)   C-Reactive Protein    Collection Time: 05/25/23 12:08 PM   Result Value    C-REACTIVE PROTEIN 32 (H)   Hold Blue Top Tube    Collection Time: 05/25/23 12:08 PM   Result Value    HOLD BLUE TOP TUBE RECEIVED IN HEMATOL         Patient educated on their likely diagnosis and follow up care plan. Reasons to return to the Emergency Department were reviewed.    Disposition     Impression(s):  Postoperative pain    Condition: Stable     Disposition:  Discharge    Loni Beckwith, MD  Emergency Medicine Attending  [facility]

## 2023-05-25 NOTE — Narrator Note (Signed)
Ortho bedside

## 2023-05-26 ENCOUNTER — Telehealth (HOSPITAL_BASED_OUTPATIENT_CLINIC_OR_DEPARTMENT_OTHER): Payer: Self-pay | Admitting: Internal Medicine

## 2023-05-26 NOTE — Telephone Encounter (Signed)
-----   Message from Ledbetter B sent at 05/26/2023  2:05 PM EST -----  Regarding: meds, medical equipment  Ricca Melgarejo [id number], 68 year old, female    Calls today: To discuss pain meds, there is a discrepancy. Also would like to discuss medical equipment like commode.     Person calling on behalf of patient: Daughter    [name] NUMBER: [phone]      Patient's language of care: English    Patient does not need an interpreter.    Patient's PCP: Hildred Laser, MD    Primary Care Home Site:  [facility]

## 2023-05-26 NOTE — Telephone Encounter (Signed)
Return call attempted to patient/family,unable to reach,left message to return call call.      ----- Message from Mondovi B sent at 05/26/2023  2:05 PM EST -----  Regarding: meds, medical equipment  Cheryl Dixon [id number], 68 year old, female    Calls today: To discuss pain meds, there is a discrepancy. Also would like to discuss medical equipment like commode.     Person calling on behalf of patient: Daughter    [name] NUMBER: [phone]      Patient's language of care: English    Patient does not need an interpreter.    Patient's PCP: Hildred Laser, MD    Primary Care Home Site:  [facility]

## 2023-05-27 ENCOUNTER — Ambulatory Visit: Payer: No Typology Code available for payment source | Admitting: Orthopaedic Surgery

## 2023-05-27 ENCOUNTER — Telehealth (HOSPITAL_BASED_OUTPATIENT_CLINIC_OR_DEPARTMENT_OTHER): Payer: Self-pay

## 2023-05-27 ENCOUNTER — Ambulatory Visit: Payer: No Typology Code available for payment source | Attending: Orthopaedic Surgery | Admitting: Orthopaedic Surgery

## 2023-05-27 ENCOUNTER — Telehealth (HOSPITAL_BASED_OUTPATIENT_CLINIC_OR_DEPARTMENT_OTHER): Payer: Self-pay | Admitting: Registered Nurse

## 2023-05-27 ENCOUNTER — Other Ambulatory Visit: Payer: Self-pay

## 2023-05-27 ENCOUNTER — Telehealth (HOSPITAL_BASED_OUTPATIENT_CLINIC_OR_DEPARTMENT_OTHER): Payer: Self-pay | Admitting: Internal Medicine

## 2023-05-27 DIAGNOSIS — R2681 Unsteadiness on feet: Secondary | ICD-10-CM | POA: Insufficient documentation

## 2023-05-27 DIAGNOSIS — Z96651 Presence of right artificial knee joint: Secondary | ICD-10-CM | POA: Diagnosis present

## 2023-05-27 DIAGNOSIS — S86811D Strain of other muscle(s) and tendon(s) at lower leg level, right leg, subsequent encounter: Secondary | ICD-10-CM | POA: Diagnosis present

## 2023-05-27 LAB — WOUND CULT EXT AND GRAM STAIN: WOUND CULTURE EXTENDED: NO GROWTH

## 2023-05-27 LAB — WOUND CULTURE ANAEROBIC

## 2023-05-27 MED ORDER — OTHER MEDICATION
0 refills | Status: AC
Start: 2023-05-27 — End: 2023-06-26

## 2023-05-27 MED ORDER — OXYCODONE HCL 5 MG PO TABS
5.0000 mg | ORAL_TABLET | ORAL | 0 refills | Status: AC | PRN
Start: 2023-05-27 — End: 2023-06-03

## 2023-05-27 NOTE — Progress Notes (Addendum)
Orthopedic Office Note    CC: Patient presents with:  Follow Up: Pt c/o  Rt leg  feels very tight, not sure if Wound vac is working properly       ORTHOPEDIC PROBLEM LIST:  1. S/p right TKA, DOS: 03/11/2023, Doherty  2. Open right patella tendon rupture (grade 3), DOI: 03/26/2023, s/p poly exchange, irrigation/wound debridement, patellar tendon repair with cerclage cable, DOS: 03/29/2023, Lennie Muckle  3. S/p revision right TKA to constrained prosthesis, extensor mechanism reconstruction, patellectomy and ROH, DOS: 05/19/23, Lorel Monaco    HPI: Cheryl Dixon is a 68 year old female who presents today for urgent postop evaluation for her right knee. She is 8 days post op.    To review: Patient has had a complicated postop course regarding her right knee. She was initially a patient of Dr. Baldwin [place] who underwent a primary total knee replacement without complication on 03/11/2023. Unfortunately, she sustained a mechanical fall on 03/26/2023; she was sitting in a chair that was lower than she had expected; felt a slight popping sensation and immediately had pain, bleeding. She was found to have a 2.5 cm inferior wound dehiscence as well as evidence for a patellar tendon rupture. She was placed into a knee immobilizer. She subsequently had a new injury on 03/28/2023 where her right leg gave out while wearing the knee immobilizer causing increased bleeding. She was BIBA to the ED and admitted to the medical service in anticipation of surgical intervention. On exam she was found to have a grade 3 open dehiscence of a right total knee arthroplasty wound and subsequently underwent an open right patellar tendon rupture repair, irrigation and excisional debridement, antibiotic bead placement, extensor tendon tendon reinforcement with cerclage cable, poly exchange and closure. She followed up with Dr. Lucille Passy office on 04/14/2023 and x-ray/clinical evaluation showed evidence for failure of the patellar tendon repair with patella  alta and displacement of the cable. She had an infectious work up with Dr. Lorel Monaco that was negative; she underwent surgery on 05/19/23 and discharged home the next day after she was put into a long leg cast on the floor with a window for her Wound Vac.    Patient had previously called because she was experiencing some discomfort on Monday in the right lower extremity. She felt the cast was too tight. She presents to the ED on 05/25/2023 and her cast was bivalved after being evaluated by Buelah Manis, PA-C. She called again with concerns about the knee. She is brought in today for urgent evaluation. She was concerned that her wound VAC may not be working. She is having some discomfort throughout the leg and feels the cast may be a bit too tight. She did feel better after it was bivalved. She did remove some of the proximal dressing to allow for more expansion. She has continued to maintain extension.    She is also wondering about pain control; she was on Dilaudid 2 mg every 4 hours but had asked to transition to oxycodone. In the ED the provider only gave her 12 pills scheduled for 12 days at every 4 hours. She is requiring about 1-2 tabs every 4 hours. She is wondering about a refill.    IMAGING: x-ray of the right knee 2 views from 03/30/2023 shows a right total knee replacement in good alignment with no evidence for hardware failure. There is a single loop cerclage wire extending from the patella to the tibial tubercle as well as antibiotic beads.  X-rays of the right knee 2 views taken on 04/14/2023 shows patella alta. Cerclage wire still in place at the tibial tubercle but is no longer through the patella. Total knee prosthesis appears to still be in good alignment.    X-rays of the right knee 1 view taken on 05/18/2022 in PACU show a successful revision to constrained total knee replacement. Antibiotic beads noted. Bledsoe brace. Wound VAC. No acute fractures or dislocations.    Imaging was reviewed by myself  and Dr. Lorel Monaco during this visit.     PHYSICAL EXAM:  GENERAL: Alert and oriented, in no acute distress  MOOD: Appropriate.   MUSCULOSKELETAL: Evaluation of the right lower extremity reveals a long-leg cast that is bivalved, more posterior on the medial side than on the lateral. Windowed at the anterior aspect for the wound VAC. Wound VAC is intact and working appropriately. 14-day wound VAC battery. Cast is otherwise in good repair.    ASSESSMENT/PLAN: 68 year old female who presents today for urgent reevaluation now around 8 days postop from a right knee removal of hardware, revision total knee replacement to a constrained prosthesis, extensor mechanism reconstruction, patellectomy on 05/19/2023 with Dr. Lorel Monaco. She was placed into a long-leg cast after surgery. This has been bothersome to her. She was presented to the ED on Monday, 05/25/2023 and the cast was bivalved. Unfortunately, the cuts for the cast are too posterior. We would like to revise the cast but would prefer to do this at [facility]. She was reinforced today with Coban. Continue to follow-up on Friday for cast revision.    In terms of pain control, we are happy to provide her with an prescription for oxycodone. She knows not to use this with Dilaudid. ED provider wrote for 12 pills over 12 days. Note provided with rx to see if we can get her a refill sooner. She will call with any issues with this.    We reviewed the likely diagnosis, the prognosis, and various treatment options in detail. Risks and benefits of treatment plan discussed. The patient's questions have been answered, and the patient understands agrees with treatment plan.     Dr. Lorel Monaco saw and examined the patient and formulated the assessment and plan. Please see his dictated note for further information.    This note was prepared using voice recognition software. Please disregard any transcription errors.    Myer Haff, PA-C, 05/27/2023      Nursing  Communication:  New Long leg cast; ?XOA    ORTHOPAEDIC ATTENDING PHYSICIAN ATTESTATION    I saw and examined this patient with Myer Haff, PA-C.  I performed a complete history and physical exam today and agree with documentation here by PA-C. I spent more than 50% of this joint visit with the patient and did the substantive part of the visit and decision making.    A total of 30 minutes were spent today performing a complete history and physical exam, coordinating patient care, planning for follow up, reviewing history and performing appropriate documentation.      [name], MD

## 2023-05-27 NOTE — Telephone Encounter (Signed)
Spoke to patient from different encounter.    Return call attempted to patient/family,unable to reach,left message to return call call.      ----- Message from Leeton B sent at 05/26/2023  2:05 PM EST -----  Regarding: meds, medical equipment  Cheryl Dixon [id number], 68 year old, female    Calls today: To discuss pain meds, there is a discrepancy. Also would like to discuss medical equipment like commode.     Person calling on behalf of patient: Daughter    [name] NUMBER: [phone]      Patient's language of care: English    Patient does not need an interpreter.    Patient's PCP: Hildred Laser, MD    Primary Care Home Site:  [facility]

## 2023-05-27 NOTE — Telephone Encounter (Addendum)
I called pt back,    Pt said she was not given an extra batter,  And cast is feeling too tight  the whole leg .    Pt has been elevating the leg,    Pt will be in today before 3 p                 ----- Message from Rudean Haskell sent at 05/27/2023 10:35 AM EST -----  Regarding: Lorel Monaco - Wound vac and cast  Contact: [phone]  Thelma Barge w/ [facility] called w/Pt rptg: 1-wound vac not wkg, "light not going off as it should;" 2- Cast too tight, leg pulsing, went to ER situation not resolved. Appt scheduled 1/31. Please advise.    Please call Pt at [phone]

## 2023-05-27 NOTE — Telephone Encounter (Signed)
Thelma Barge w/ [facility] called w/Pt rptg:     1-wound vac not wkg, "light not going off as it should;"     2- Cast too tight, leg pulsing, went to ER situation not resolved.     Appt scheduled 1/31. Please advise.    Please call Pt at [phone]

## 2023-05-27 NOTE — Telephone Encounter (Signed)
Pt called requesting refill oxyCODONE (ROXICODONE) 5 MG immediate release tablet. Pt rpts 1 pill left. Please send refill     Pharmacy.     [id number] - [place], [place] - [address]   [address], [place] [address]

## 2023-05-27 NOTE — Telephone Encounter (Signed)
I put in prescription for bedside commode.  Regarding electric wheelchair I will defer to surgical team to determine what mobility devices are most appropriate in her situation

## 2023-05-27 NOTE — Telephone Encounter (Signed)
-----   Message from [facility] S sent at 05/27/2023  9:37 AM EST -----  Regarding: MOBILITY ASSISTANCE  Patient would like to discuss mobility assistance. Like a wheelchair and bathroom equipment.    [place], 05/27/2023

## 2023-05-27 NOTE — Telephone Encounter (Signed)
Returned call to patient as per below messages.  Patient reports she did not call us,states may have been her daughter who called this morning.  However,states she would like following equipment ordered for her.  > Wheelchair( electric),states she   Can't do anymore',she needs wheelchair to move around.  > Needs commode     Advised,I will relay message to provider/team.        ----- Message from [facility] sent at 05/27/2023  9:37 AM EST -----  Regarding: MOBILITY ASSISTANCE  Patient would like to discuss mobility assistance. Like a wheelchair and bathroom equipment.    [place], 05/27/2023

## 2023-05-29 ENCOUNTER — Ambulatory Visit (HOSPITAL_BASED_OUTPATIENT_CLINIC_OR_DEPARTMENT_OTHER): Payer: No Typology Code available for payment source | Admitting: Physician Assistant

## 2023-05-29 ENCOUNTER — Other Ambulatory Visit: Payer: Self-pay

## 2023-05-29 ENCOUNTER — Ambulatory Visit
Admission: RE | Admit: 2023-05-29 | Discharge: 2023-05-29 | Disposition: A | Discharge status: Home / Self Care | Payer: No Typology Code available for payment source | Source: Ambulatory Visit

## 2023-05-29 DIAGNOSIS — Z471 Aftercare following joint replacement surgery: Secondary | ICD-10-CM | POA: Diagnosis present

## 2023-05-29 DIAGNOSIS — Z96651 Presence of right artificial knee joint: Secondary | ICD-10-CM | POA: Diagnosis present

## 2023-05-29 LAB — WOUND CULTURE ANAEROBIC

## 2023-05-29 LAB — WOUND CULT EXT AND GRAM STAIN
WOUND CULTURE EXTENDED: NO GROWTH
WOUND CULTURE EXTENDED: NO GROWTH

## 2023-05-29 NOTE — Progress Notes (Signed)
Orthopedic Office Note    CC: Patient presents with:  Follow Up: Status post revision of total replacement of right knee      ORTHOPEDIC PROBLEM LIST:  1. S/p right TKA, DOS: 03/11/2023, Doherty  2. Open right patella tendon rupture (grade 3), DOI: 03/26/2023, s/p poly exchange, irrigation/wound debridement, patellar tendon repair with cerclage cable, DOS: 03/29/2023, Lennie Muckle  3. S/p revision right TKA to constrained prosthesis, extensor mechanism reconstruction, patellectomy and ROH, DOS: 05/19/23, Lorel Monaco    HPI: Cheryl Dixon is a 68 year old female who presents today for urgent postop evaluation for her right knee. She is 10 days post op. Here today for a cast change.    To review: Patient has had a complicated postop course regarding her right knee. She was initially a patient of Dr. Baldwin [place] who underwent a primary total knee replacement without complication on 03/11/2023. Unfortunately, she sustained a mechanical fall on 03/26/2023; she was sitting in a chair that was lower than she had expected; felt a slight popping sensation and immediately had pain, bleeding. She was found to have a 2.5 cm inferior wound dehiscence as well as evidence for a patellar tendon rupture. She was placed into a knee immobilizer. She subsequently had a new injury on 03/28/2023 where her right leg gave out while wearing the knee immobilizer causing increased bleeding. She was BIBA to the ED and admitted to the medical service in anticipation of surgical intervention. On exam she was found to have a grade 3 open dehiscence of a right total knee arthroplasty wound and subsequently underwent an open right patellar tendon rupture repair, irrigation and excisional debridement, antibiotic bead placement, extensor tendon tendon reinforcement with cerclage cable, poly exchange and closure. She followed up with Dr. Lucille Passy office on 04/14/2023 and x-ray/clinical evaluation showed evidence for failure of the patellar tendon repair  with patella alta and displacement of the cable. She had an infectious work up with Dr. Lorel Monaco that was negative; she underwent surgery on 05/19/23 and discharged home the next day after she was put into a long leg cast on the floor with a window for her Wound Vac.    She had the cast bivalved in the ED on 05/25/23 to relieve pain from swelling. The bi-valve was too posterior and therefore not appropriate for her current restrictions. She is here today for cast exchange.    IMAGING: x-ray of the right knee 2 views taken today show a constrained TKA in good alignment. No evidence for hardware failure.    Imaging was reviewed by myself during this visit.     PHYSICAL EXAM:  GENERAL: Alert and oriented, in no acute distress  MOOD: Appropriate.   MUSCULOSKELETAL: Evaluation of the right lower extremity reveals a long-leg cast that is bivalved, more posterior on the medial side than on the lateral. Removed. Skin is warm, dry, intact. Wound Vac intact and functioning properly. No output in the cannister. Thigh and calf soft and NTTP. Readily able to wiggle toes. NVI distally.    ASSESSMENT/PLAN: 68 year old female who presents today for cast exchange now 10 days postop from a right knee removal of hardware, revision total knee replacement to a constrained prosthesis, extensor mechanism reconstruction, patellectomy on 05/19/2023 with Dr. Lorel Monaco. She was placed into a long-leg cast after surgery that was bivalved. Cast replaced today. We will send her for XR of the knee today on her way out. Follow up as previously scheduled on 06/10/23.   We reviewed the  likely diagnosis, the prognosis, and various treatment options in detail. Risks and benefits of treatment plan discussed. The patient's questions have been answered, and the patient understands agrees with treatment plan.     Patient was evaluated independently by the PA.      This note was prepared using voice recognition software. Please disregard any transcription  errors.    Myer Haff, PA-C, 05/29/2023      Nursing Communication:  Cast removal, wound check

## 2023-05-29 NOTE — Progress Notes (Signed)
Well padded Long leg cast w/ window applied by me under the direction of Levora Angel.    C/S/M normal after cast applied.    Cast/splint care Instructions given to and reviewed with patient.  Patient informed to call office if any pain or swelling occur.    Geryl Rankins, 05/29/2023

## 2023-06-01 ENCOUNTER — Telehealth (HOSPITAL_BASED_OUTPATIENT_CLINIC_OR_DEPARTMENT_OTHER): Payer: Self-pay | Admitting: Registered Nurse

## 2023-06-01 LAB — SURGICAL PATH SPECIMEN HARDWARE

## 2023-06-01 NOTE — Telephone Encounter (Addendum)
I called pt back,  Appt with Dr Lorel Monaco  2/5 at 945 a  Pt will pay out of pocket for Oxycodone  No other questions      Message  Received: Today  Myer Haff, Cletus Gash, RN; [name], MD  Caller: Unspecified (Today,  1:09 PM)  Pt can come into [place] to have the cast cut down from the top    Oxycodone is cheap-- may be inexpensive to pay out of pocket                          I called pt back to discuss her concerns  Pt said she has not been able to pick up the Oxycodone (Written on 05/27/23)  Stating INS  will not cover per Pharmacy,     Pt having Cast issues,  with foot swelling  pt having more pain  Also  feels the cast is too high and rubbing against her groin area  Pt has wrapped an Ace bandage around the top part of the cast  Asking if she can come in this week  to have the top of the cast cut down a few inches    I let her know ,   I will call her pharmacy to inquire about the prescription of Oxycodone and  Send her request to Dr Lorel Monaco  She agreed with plan        --[facility] ask [facility] the [facility] of [facility]one sent on 05/27/23  Unable to connect to the pharmacy,  their line is having connection issues    Message sent to [facility] PA and Dr Lorel Monaco                 ----- Message from Gwynneth Albright sent at 06/01/2023 11:39 AM EST -----  Regarding: esposito  patient  Contact: [phone]  Needs refill of oxycodone, stating that insurance not authorization, also having trouble with her cast, please call

## 2023-06-02 ENCOUNTER — Ambulatory Visit: Payer: No Typology Code available for payment source | Attending: Psychosomatic Medicine | Admitting: Clinical

## 2023-06-02 ENCOUNTER — Encounter (HOSPITAL_BASED_OUTPATIENT_CLINIC_OR_DEPARTMENT_OTHER): Payer: Self-pay | Admitting: Family Medicine

## 2023-06-02 ENCOUNTER — Ambulatory Visit: Payer: No Typology Code available for payment source | Attending: Family Medicine | Admitting: Family Medicine

## 2023-06-02 DIAGNOSIS — F3341 Major depressive disorder, recurrent, in partial remission: Secondary | ICD-10-CM | POA: Diagnosis present

## 2023-06-02 DIAGNOSIS — F1120 Opioid dependence, uncomplicated: Secondary | ICD-10-CM | POA: Diagnosis present

## 2023-06-02 MED ORDER — BUPRENORPHINE HCL-NALOXONE HCL 8-2 MG SL FILM
ORAL_FILM | SUBLINGUAL | 0 refills | Status: DC
Start: 2023-06-02 — End: 2023-06-12

## 2023-06-02 NOTE — Progress Notes (Signed)
Cheryl Dixon is a 68 year old female seen in follow up for opioid use disorder:    Buprenorphine dose: 2.5  tabs daily (x8mg )  Response, adequacy of dose: requiring more for pain control  Relapses/close calls: No  Meetings: attends every 4+ week  Non-prescribed substance use: None reported    Social history/events:  In pain, can't get out of house, cast on from toe to hip  Taking 3.5 films a day of suboxone but feels it is not as efffective, waking up in the middle of the night with symptoms, notes that prior generic brand was working better for her which was in a yellow box    Additional psychiatric or medical care/events: 3 surgeries - 05/19/2023 revision of right TKA    Present Medications:  oxyCODONE (ROXICODONE) 5 MG immediate release tablet, Take 1-2 tablets by mouth every 4 (four) hours as needed for Pain (for severe pain) Max Daily Amount: 60 mg  for up to 7 days, Disp: 40 tablet, Rfl: 0  OTHER MEDICATION, Bedside commode  For use as directed.    DX: OA knee, gait instability  ICD10:M17.9, R26.81, Disp: 1 each, Rfl: 0  oxyCODONE (ROXICODONE) 5 MG immediate release tablet, Take 1 tablet by mouth every 4 (four) hours as needed for Pain Max Daily Amount: 30 mg  for up to 12 days, Disp: 12 tablet, Rfl: 0  hydrocortisone 2.5 % cream, Apply topically 2 (two) times daily To affected areas of scalp and ears, Disp: 56 g, Rfl: 1  acetaminophen (TYLENOL) 500 MG tablet, Take 2 tablets by mouth every 8 (eight) hours as needed for Pain, Disp: 60 tablet, Rfl: 0  aspirin (ASPIRIN 81) 81 MG chewable tablet, Take 1 tablet by mouth in the morning and 1 tablet before bedtime., Disp: 56 tablet, Rfl: 0  celecoxib (CELEBREX) 200 MG capsule, Take 1 capsule by mouth daily, Disp: 30 capsule, Rfl: 0  cefadroxil (DURICEF) 1 GM tablet, Take 1 tablet by mouth in the morning and 1 tablet before bedtime. Do all this for 14 days., Disp: 28 tablet, Rfl: 0  polyethylene glycol (GLYCOLAX/MIRALAX) 17 g packet, Take 1 packet by mouth daily, Disp:  90 packet, Rfl: 3  albuterol HFA 108 (90 Base) MCG/ACT inhaler, Inhale 2 puffs into the lungs every 6 (six) hours as needed for Wheezing, Disp: , Rfl:   buprenorphine-naloxone (SUBOXONE) 8-2 MG sublingual film, Take 1.5 films under the tongue in the morning, 1 film midday, and 1 film at night, Disp: 98 Film, Rfl: 0  docusate sodium (COLACE) 100 MG capsule, TAKE 1 CAPSULE BY MOUTH IN THE MORNING AND BEFORE BEDTIME, Disp: 180 capsule, Rfl: 3  estrogen, conjugated, (PREMARIN) 0.625 MG/GM vaginal cream, Place vaginally daily, Disp: , Rfl:   solifenacin (VESICARE) 10 MG tablet, TAKE 1 TABLET BY MOUTH EVERY DAY IN THE MORNING, Disp: 90 tablet, Rfl: 0  tiotropium-olodaterol (STIOLTO RESPIMAT) 2.5-2.5 MCG/ACT inhal, Inhale 2 puffs into the lungs daily, Disp: , Rfl:   buPROPion (WELLBUTRIN SR) 200 MG 12 hr tablet, TAKE 1 TABLET BY MOUTH IN THE MORNING AND BEFORE BEDTIME, Disp: 180 tablet, Rfl: 3  cetirizine (ZYRTEC) 10 MG tablet, TAKE 1 TABLET BY MOUTH EVERY DAY IN THE MORNING, Disp: 90 tablet, Rfl: 3  VITAMIN D3 50 MCG (2000 UT) TABS tablet, TAKE 1 TABLET BY MOUTH EVERY DAY IN THE MORNING, Disp: 90 tablet, Rfl: 3  atorvastatin (LIPITOR) 80 MG tablet, Take 1 tablet by mouth Daily after dinner (Patient not taking: Reported on 05/27/2023), Disp: 90 tablet,  Rfl: 3    No current facility-administered medications on file prior to visit.      Last UDS:  LAST UDS   AMPHETAMINES URINE (ng/mL)   Date Value   10/27/2022 NEGATIVE     COCAINE METABOLITES URINE (ng/mL)   Date Value   10/27/2022 NEGATIVE     OPIATES URINE (ng/mL)   Date Value   10/27/2022 NEGATIVE     BENZODIAZEPINES URINE (ng/mL)   Date Value   10/27/2022 NEGATIVE     CANNABINOIDS URINE (ng/mL)   Date Value   10/27/2022 POSITIVE (A)     ETHANOL URINE (mg/dL)   Date Value   16/01/9603 NEGATIVE       Review of Systems: no notable symptoms    PHYSICAL EXAMINATION: by video  General appearance - healthy female in no distress  Eyes - pupils normal  Skin - warm and dry  Neuro -  nonfocal  Affect - normal    Problem List Items Addressed This Visit          Mental Health    Opioid dependence on agonist therapy (HCC)          Today in group we discussed building support systems. Cheryl Dixon contributed.    Assessment:  Opioid use disorder  Comment: the patient actively participated and shared experiences, interacting appropriately with group without barriers to understanding  Plan:  Continue suboxone to 3.5 films daily dose TID (1.5 films in AM, 1 film mid day, and 1 film at night) for pain control. Rx for generic brand (yellow box appears to be Alvogen brand) that was more effective for her in the past. Patient agrees to call clinic if symptoms are not controlled on this regimen.  Patient is counseled regarding relapse prevention, involvement in recovery groups, and potential side effects of buprenorphine.    Cheryl Glatter, MD, 06/02/2023

## 2023-06-03 ENCOUNTER — Ambulatory Visit: Payer: No Typology Code available for payment source | Attending: Emergency Medicine | Admitting: Orthopaedic Surgery

## 2023-06-03 ENCOUNTER — Other Ambulatory Visit: Payer: Self-pay

## 2023-06-03 DIAGNOSIS — Z96651 Presence of right artificial knee joint: Secondary | ICD-10-CM | POA: Insufficient documentation

## 2023-06-03 DIAGNOSIS — S86811D Strain of other muscle(s) and tendon(s) at lower leg level, right leg, subsequent encounter: Secondary | ICD-10-CM | POA: Diagnosis present

## 2023-06-03 NOTE — Progress Notes (Signed)
Per Provider request,  upper Rt leg cast was outlined  Where they wanted it cut  Area was cut  Web roll applied and taped  Along with  Mole skin applied over area that was cut    Pt stating it feels much better

## 2023-06-03 NOTE — Progress Notes (Addendum)
Orthopedic Office Note    CC: Patient presents with:  Follow Up: Cast check      ORTHOPEDIC PROBLEM LIST:  1. S/p right TKA, DOS: 03/11/2023, [place]  2. Open right patella tendon rupture (grade 3), DOI: 03/26/2023, s/p poly exchange, irrigation/wound debridement, patellar tendon repair with cerclage cable, DOS: 03/29/2023, Lennie Muckle  3. S/p revision right TKA to constrained prosthesis, extensor mechanism reconstruction, patellectomy and ROH, DOS: 05/19/23, Lorel Monaco    HPI: Cheryl Dixon is a 68 year old female who presents today for urgent postop evaluation for her right knee. She is 15 days post op. Here today with pain from her cast.    To review: Patient has had a complicated postop course regarding her right knee. She was initially a patient of Dr. Baldwin [place] who underwent a primary total knee replacement without complication on 03/11/2023. Unfortunately, she sustained a mechanical fall on 03/26/2023; she was sitting in a chair that was lower than she had expected; felt a slight popping sensation and immediately had pain, bleeding. She was found to have a 2.5 cm inferior wound dehiscence as well as evidence for a patellar tendon rupture. She was placed into a knee immobilizer. She subsequently had a new injury on 03/28/2023 where her right leg gave out while wearing the knee immobilizer causing increased bleeding. She was BIBA to the ED and admitted to the medical service in anticipation of surgical intervention. On exam she was found to have a grade 3 open dehiscence of a right total knee arthroplasty wound and subsequently underwent an open right patellar tendon rupture repair, irrigation and excisional debridement, antibiotic bead placement, extensor tendon tendon reinforcement with cerclage cable, poly exchange and closure. She followed up with Dr. Lucille Passy office on 04/14/2023 and x-ray/clinical evaluation showed evidence for failure of the patellar tendon repair with patella alta and displacement of the  cable. She had an infectious work up with Dr. Lorel Monaco that was negative; she underwent surgery on 05/19/23 and discharged home the next day after she was put into a long leg cast on the floor with a window for her Wound Vac.    Cast was last changed 05/29/2023. She feels the cast is at the upper portion of her thigh and it is quite uncomfortable. No new injuries or traumas. Pain is controlled with Tylenol, Celebrex, oxycodone as needed. Continues to deny fevers, chills, feelings of malaise. No output in her wound VAC canister.    IMAGING: x-ray of the right knee 2 views taken today show a constrained TKA in good alignment. No evidence for hardware failure. Some cement lateral to the proximal tibia.    Imaging was reviewed by myself and Dr. Lorel Monaco during this visit.     PHYSICAL EXAM:  GENERAL: Alert and oriented, in no acute distress  MOOD: Appropriate.   MUSCULOSKELETAL: Evaluation of the right lower extremity reveals a long-leg cast that is windowed anteriorly. In good repair; no evidence for loosening. Skin is warm, dry, intact. Wound Vac intact and functioning properly. No output in the cannister. Thigh and calf soft and NTTP. Readily able to wiggle toes. NVI distally.    ASSESSMENT/PLAN: 68 year old female who presents today for cast alteration now 15 days postop from a right knee removal of hardware, revision total knee replacement to a constrained prosthesis, extensor mechanism reconstruction, patellectomy on 05/19/2023 with Dr. Lorel Monaco. She had her cast changed last week on 05/29/23. The proximal portion is pinching on the skin and is making it hard to use  the bathroom, etc. We did take down a bit of the proximal cast; did discuss that we cannot take down too much without compromising the integrity due to the windowing/posterior reinforcement. She understands. FU as previously scheduled on 06/10/23.     We reviewed the likely diagnosis, the prognosis, and various treatment options in detail. Risks and  benefits of treatment plan discussed. The patient's questions have been answered, and the patient understands agrees with treatment plan.     Dr. Lorel Monaco saw and evaluated the patient and formulated the assessment and plan. Please see his attending note.    This note was prepared using voice recognition software. Please disregard any transcription errors.    Myer Haff, PA-C, 06/03/2023      Nursing Communication:  Cast removal, wound check    ORTHOPAEDIC ATTENDING PHYSICIAN ATTESTATION    I saw and examined this patient with Myer Haff, PA-C.  I performed a complete history and physical exam today and agree with documentation here by PA-C. I spent more than 50% of this joint visit with the patient and did the substantive part of the visit and decision making.    A total of 30 minutes were spent today performing a complete history and physical exam, coordinating patient care, planning for follow up, reviewing history and performing appropriate documentation.      [name], MD

## 2023-06-04 ENCOUNTER — Encounter (HOSPITAL_BASED_OUTPATIENT_CLINIC_OR_DEPARTMENT_OTHER): Payer: Self-pay | Admitting: Family Medicine

## 2023-06-04 ENCOUNTER — Telehealth (HOSPITAL_BASED_OUTPATIENT_CLINIC_OR_DEPARTMENT_OTHER): Payer: Self-pay | Admitting: Internal Medicine

## 2023-06-04 NOTE — Prior Authorization (Signed)
Patient Insurance: (P)CR[employer] [facility] PCN [id number])  Member ID: [id number]  BIN: [id number]  PCN: [id number]  Group: [id number]  Prior Authorization for: Bupren/Nalox Mis 8-2mg  Films

## 2023-06-04 NOTE — Telephone Encounter (Signed)
-----   Message from [place] B sent at 06/04/2023  3:33 PM EST -----  Regarding: [facility] needs clinical info  Cheryl Dixon [id number], 68 year old, female, Telephone Information:  Home Phone      [phone]  Work Phone      Not on file.  Mobile          [phone]           Calls today: Other: Gwendolyn Lima calling from [facility] and needs clinical information on a prior auth for buprenorphine-naloxone (SUBOXONE) 8-2 MG sublingual film    Person calling on behalf of patient: Pharmacy    CALL BACK NUMBER: [phone]    Patient's language of care: English    Patient does not need an interpreter.    Patient's PCP: Hildred Laser, MD

## 2023-06-04 NOTE — Progress Notes (Signed)
OUTPATIENT PSYCHIATRY GROUP PROGRESS NOTE    For the patient participating in the group via telephone and/or video technology: The patient/guardian has verbally consented to participate in this group therapy session by televisit. The patient/guardian was encouraged to be in a private location due to personal health information being discussed and where they could not be overheard by individuals not participating in the group therapy. Although all [facility] staff/providers participating in this group therapy televisit are in a private location, all the risks of telephone and/or video technology used to conduct this group therapy session cannot be controlled by [facility], (i.e. interruptions, unauthorized access/recording and technical difficulties).  Each patient understands that the visit can be stopped at any time for any reason, including if the conferencing connections are inadequate.    Cheryl Dixon is a 68 year old, Divorced, female, who presented for [facility] group visit.   Length of group: 60 min which included 30 min of Health Behavior Intervention and OUD Psychoeducation.    Group Name: OBOT: Office Based Opioid Treatment Group    Group Topic Today:  The Importance of Support Networks in Recovery. This session aimed to help group members explore how they have built and maintained their support networks during recovery, recognize challenges, and discuss strategies to strengthen their support systems. Group members reviewed and discussed strategies to enhance and maintain their support networks.  Group members shared strategies to reduce relapse risks and increase healthy coping mechanisms to support healthy change. The group session provided a safe, supportive environment to explore motivation improvement strategies and foster resilience.     Leaders:  Azzie Glatter, MD, Milta Deiters, Ph.D., and Boneta Lucks, RN    Service Type: [phone] Group Psychotherapy     Number of participants in group today:  10        Purpose of  Group (choose all that apply):   Skills training/rehab  Support (psychological, family, community resources, recovery)  Insight and behavior change  Increasing health promoting behaviors  Relapse prevention and enhancing Recovery Process  Education about Opioid Use Disorder (OUD) and treatment  Enhancing adjustment and coping with OUD  Improving management of OUD and treatment adherence  Decreasing health risk behaviors and risks of relapse  Addressing psychological/psychosocial/behavioral barriers to preventing addiction and relapse        Group Process:    Review of group goals/structure  Education about Opioid Use Disorder (OUD) and related topics  Group discussion and sharing.    Review of strategies to increase healthy coping mechanisms to support recovery and change.  Review of additional relapse prevention strategies.   Discussion of support strategies  Check in    Individual Patient Participation:    Pt appeared engaged and attentive to process, and contributed constructively  Pt participated in group discussion about support networks. Cheryl Dixon is experiencing significant pain and mobility challenges, as she is currently in a cast from her toe to her hip and unable to leave the house. She reports frustration with these limitations.   Cheryl Dixon is taking 3.5 films of Suboxone per day but feels that it is not as effective as before. She has been waking up in the middle of the night with withdrawal symptoms and notes that a previous generic brand (yellow box) worked better for her.      Medical Necessity of Session (how treatment is necessary to improve symptoms, functioning, or prevent worsening): treatment is necessary to enhance compliance with agonist treatment and maintain abstinence from opioids. Suboxone group participation is mandatory  to continue Suboxone prescription and is necessary in order to enhance coping with Opioid Use Disorder.      Relevant Changes in Mental Status: No. Mood seems euthymic; no acute  issues reported or noted     Risk Level per Scale:  Suicide: re-assessment not indicated today  Violence: re-assessment not indicated today  Addiction: low     Current risk level represents increase in risk: No.      DIAGNOSES:     Opioid dependence on agonist therapy (HCC)  (primary encounter diagnosis)   SNOMED CT(R): OPIOID DEPENDENCE, ON AGONIST THERAPY     MDD (major depressive disorder), recurrent, in partial remission (HCC)   SNOMED CT(R): RECURRENT MAJOR DEPRESSION IN PARTIAL REMISSION      REVIEWING TODAY'S VISIT:   Reviewing Today's Visit:  Clinical Interventions Today: Motivational Interviewing; Cognitive Behavioral Therapy (CBT); Relapse Prevention for SUDs  Patient's Response to Interventions: Responsive  Time Spent in Psychotherapy: 60 min            CARE PLAN/ EPISODES:  Linked Episodes   Type: Episode: Status: Noted: Resolved: Last update: Updated by:   PCBHI PCBHI OBOT  Active 09/11/2020  06/02/2023 11:19 AM Milta Deiters, PhD      Comments:       Patient/Guardian:       Strengths/Skills: Help seeking and motivated to get better     Potential Barriers: Multiple life stressors       Patient stated goals: Pt would like to prevent relapses and support her recovery process     Problem 1: Opioid Dependence on Agonist Therapy (Opioid Use Disorder on Agonist Therapy)      Short Term Goals: Pt will use at least one copying skill a day to reduce a risk of relapse, enhance coping with OUD, and support her recovery process     Short Term Target:  60 days  Short TermTarget Date: 07/04/2023  Short Term Goal #1 Progress:       Long Term Goals: Pt will report 50% in urges and cravings reduction and no relapses or close calls       Long Term Target:  90 days  Long TermTarget Date:  08/03/2023   Long Term Goal #1 Progress:         Intervention #1: PCBHI-based OBOT/Group Psychotherapy   Intervention Frequency:  biweekly  Intervention Duration:  90 Days  Intervention Responsibility:  J. Maryann Conners, PhD and the OBOT  team  Milta Deiters, PhD

## 2023-06-04 NOTE — Telephone Encounter (Signed)
Got approval for brand-name suboxone, which she can get now if she is unable to get the preferred generic.

## 2023-06-04 NOTE — Telephone Encounter (Signed)
Sent to PCP/Team.      ----- Message from Ari[place] B sent at 06/04/2023  3:33 PM EST -----  Regarding: [facility]  Cheryl Dixon [id number], 68 year old, female, Telephone Information:  Home Phone      [phone]  Work Phone      Not on file.  Mobile          [phone]           Calls today: Other: Gwendolyn Lima calling from [facility] and needs clinical information on a prior auth for buprenorphine-naloxone (SUBOXONE) 8-2 MG sublingual film    Person calling on behalf of patient: Pharmacy    CALL BACK NUMBER: [phone]    Patient's language of care: English    Patient does not need an interpreter.    Patient's PCP: Hildred Laser, MD

## 2023-06-07 NOTE — Progress Notes (Signed)
Orthopedic Office Note    CC: Patient presents with:  Follow Up: Pt c/o  Rt leg  feels very tight, not sure if Wound vac is working properly       ORTHOPEDIC PROBLEM LIST:  1. S/p right TKA, DOS: 03/11/2023, Doherty  2. Open right patella tendon rupture (grade 3), DOI: 03/26/2023, s/p poly exchange, irrigation/wound debridement, patellar tendon repair with cerclage cable, DOS: 03/29/2023, Lennie Muckle  3. S/p revision right TKA to constrained prosthesis, extensor mechanism reconstruction, patellectomy and ROH, DOS: 05/19/23, Lorel Monaco    HPI: Cheryl Dixon is a 68 year old female who presents today for urgent postop evaluation for her right knee. She is 8 days post op.    To review: Patient has had a complicated postop course regarding her right knee. She was initially a patient of Dr. Baldwin [place] who underwent a primary total knee replacement without complication on 03/11/2023. Unfortunately, she sustained a mechanical fall on 03/26/2023; she was sitting in a chair that was lower than she had expected; felt a slight popping sensation and immediately had pain, bleeding. She was found to have a 2.5 cm inferior wound dehiscence as well as evidence for a patellar tendon rupture. She was placed into a knee immobilizer. She subsequently had a new injury on 03/28/2023 where her right leg gave out while wearing the knee immobilizer causing increased bleeding. She was BIBA to the ED and admitted to the medical service in anticipation of surgical intervention. On exam she was found to have a grade 3 open dehiscence of a right total knee arthroplasty wound and subsequently underwent an open right patellar tendon rupture repair, irrigation and excisional debridement, antibiotic bead placement, extensor tendon tendon reinforcement with cerclage cable, poly exchange and closure. She followed up with Dr. Lucille Passy office on 04/14/2023 and x-ray/clinical evaluation showed evidence for failure of the patellar tendon repair with patella  alta and displacement of the cable. She had an infectious work up with Dr. Lorel Monaco that was negative; she underwent surgery on 05/19/23 and discharged home the next day after she was put into a long leg cast on the floor with a window for her Wound Vac.    Patient had previously called because she was experiencing some discomfort on Monday in the right lower extremity. She felt the cast was too tight. She presents to the ED on 05/25/2023 and her cast was bivalved after being evaluated by Buelah Manis, PA-C. She called again with concerns about the knee. She is brought in today for urgent evaluation. She was concerned that her wound VAC may not be working. She is having some discomfort throughout the leg and feels the cast may be a bit too tight. She did feel better after it was bivalved. She did remove some of the proximal dressing to allow for more expansion. She has continued to maintain extension.    She is also wondering about pain control; she was on Dilaudid 2 mg every 4 hours but had asked to transition to oxycodone. In the ED the provider only gave her 12 pills scheduled for 12 days at every 4 hours. She is requiring about 1-2 tabs every 4 hours. She is wondering about a refill.    IMAGING: x-ray of the right knee 2 views from 03/30/2023 shows a right total knee replacement in good alignment with no evidence for hardware failure. There is a single loop cerclage wire extending from the patella to the tibial tubercle as well as antibiotic beads.  X-rays of the right knee 2 views taken on 04/14/2023 shows patella alta. Cerclage wire still in place at the tibial tubercle but is no longer through the patella. Total knee prosthesis appears to still be in good alignment.    X-rays of the right knee 1 view taken on 05/18/2022 in PACU show a successful revision to constrained total knee replacement. Antibiotic beads noted. Bledsoe brace. Wound VAC. No acute fractures or dislocations.    Imaging was reviewed by myself  and Dr. Lorel Monaco during this visit.     PHYSICAL EXAM:  GENERAL: Alert and oriented, in no acute distress  MOOD: Appropriate.   MUSCULOSKELETAL: Evaluation of the right lower extremity reveals a long-leg cast that is bivalved, more posterior on the medial side than on the lateral. Windowed at the anterior aspect for the wound VAC. Wound VAC is intact and working appropriately. 14-day wound VAC battery. Cast is otherwise in good repair.    ASSESSMENT/PLAN: 68 year old female who presents today for urgent reevaluation now around 8 days postop from a right knee removal of hardware, revision total knee replacement to a constrained prosthesis, extensor mechanism reconstruction, patellectomy on 05/19/2023 with Dr. Lorel Monaco. She was placed into a long-leg cast after surgery. This has been bothersome to her. She was presented to the ED on Monday, 05/25/2023 and the cast was bivalved. Unfortunately, the cuts for the cast are too posterior. We would like to revise the cast but would prefer to do this at [facility]. She was reinforced today with Coban. Continue to follow-up on Friday for cast revision.    In terms of pain control, we are happy to provide her with an prescription for oxycodone. She knows not to use this with Dilaudid. ED provider wrote for 12 pills over 12 days. Note provided with rx to see if we can get her a refill sooner. She will call with any issues with this.    We reviewed the likely diagnosis, the prognosis, and various treatment options in detail. Risks and benefits of treatment plan discussed. The patient's questions have been answered, and the patient understands agrees with treatment plan.     Dr. Lorel Monaco saw and examined the patient and formulated the assessment and plan. Please see his dictated note for further information.    This note was prepared using voice recognition software. Please disregard any transcription errors.    Myer Haff, PA-C, 06/07/2023      Nursing Communication:  New  Long leg cast; ?XOA    ORTHOPAEDIC ATTENDING PHYSICIAN ATTESTATION    I saw and examined this patient with Myer Haff, PA-C.  I performed a complete history and physical exam today and agree with documentation here by PA-C. I spent more than 50% of this joint visit with the patient and did the substantive part of the visit and decision making.    A total of 30 minutes were spent today performing a complete history and physical exam, coordinating patient care, planning for follow up, reviewing history and performing appropriate documentation.      [name], MD

## 2023-06-07 NOTE — Progress Notes (Signed)
Orthopedic Office Note    CC: Patient presents with:  Follow Up: Cast check      ORTHOPEDIC PROBLEM LIST:  1. S/p right TKA, DOS: 03/11/2023, Doherty  2. Open right patella tendon rupture (grade 3), DOI: 03/26/2023, s/p poly exchange, irrigation/wound debridement, patellar tendon repair with cerclage cable, DOS: 03/29/2023, Lennie Muckle  3. S/p revision right TKA to constrained prosthesis, extensor mechanism reconstruction, patellectomy and ROH, DOS: 05/19/23, Lorel Monaco    HPI: Cheryl Dixon is a 68 year old female who presents today for urgent postop evaluation for her right knee. She is 15 days post op. Here today with pain from her cast.    To review: Patient has had a complicated postop course regarding her right knee. She was initially a patient of Dr. Baldwin [place] who underwent a primary total knee replacement without complication on 03/11/2023. Unfortunately, she sustained a mechanical fall on 03/26/2023; she was sitting in a chair that was lower than she had expected; felt a slight popping sensation and immediately had pain, bleeding. She was found to have a 2.5 cm inferior wound dehiscence as well as evidence for a patellar tendon rupture. She was placed into a knee immobilizer. She subsequently had a new injury on 03/28/2023 where her right leg gave out while wearing the knee immobilizer causing increased bleeding. She was BIBA to the ED and admitted to the medical service in anticipation of surgical intervention. On exam she was found to have a grade 3 open dehiscence of a right total knee arthroplasty wound and subsequently underwent an open right patellar tendon rupture repair, irrigation and excisional debridement, antibiotic bead placement, extensor tendon tendon reinforcement with cerclage cable, poly exchange and closure. She followed up with Dr. Lucille Passy office on 04/14/2023 and x-ray/clinical evaluation showed evidence for failure of the patellar tendon repair with patella alta and displacement of the  cable. She had an infectious work up with Dr. Lorel Monaco that was negative; she underwent surgery on 05/19/23 and discharged home the next day after she was put into a long leg cast on the floor with a window for her Wound Vac.    Cast was last changed 05/29/2023. She feels the cast is at the upper portion of her thigh and it is quite uncomfortable. No new injuries or traumas. Pain is controlled with Tylenol, Celebrex, oxycodone as needed. Continues to deny fevers, chills, feelings of malaise. No output in her wound VAC canister.    IMAGING: x-ray of the right knee 2 views taken today show a constrained TKA in good alignment. No evidence for hardware failure. Some cement lateral to the proximal tibia.    Imaging was reviewed by myself and Dr. Lorel Monaco during this visit.     PHYSICAL EXAM:  GENERAL: Alert and oriented, in no acute distress  MOOD: Appropriate.   MUSCULOSKELETAL: Evaluation of the right lower extremity reveals a long-leg cast that is windowed anteriorly. In good repair; no evidence for loosening. Skin is warm, dry, intact. Wound Vac intact and functioning properly. No output in the cannister. Thigh and calf soft and NTTP. Readily able to wiggle toes. NVI distally.    ASSESSMENT/PLAN: 68 year old female who presents today for cast alteration now 15 days postop from a right knee removal of hardware, revision total knee replacement to a constrained prosthesis, extensor mechanism reconstruction, patellectomy on 05/19/2023 with Dr. Lorel Monaco. She had her cast changed last week on 05/29/23. The proximal portion is pinching on the skin and is making it hard to use  the bathroom, etc. We did take down a bit of the proximal cast; did discuss that we cannot take down too much without compromising the integrity due to the windowing/posterior reinforcement. She understands. FU as previously scheduled on 06/10/23.     We reviewed the likely diagnosis, the prognosis, and various treatment options in detail. Risks and  benefits of treatment plan discussed. The patient's questions have been answered, and the patient understands agrees with treatment plan.     Dr. Lorel Monaco saw and evaluated the patient and formulated the assessment and plan. Please see his attending note.    This note was prepared using voice recognition software. Please disregard any transcription errors.    Myer Haff, PA-C, 06/07/2023      Nursing Communication:  Cast removal, wound check    ORTHOPAEDIC ATTENDING PHYSICIAN ATTESTATION    I saw and examined this patient with Myer Haff, PA-C.  I performed a complete history and physical exam today and agree with documentation here by PA-C. I spent more than 50% of this joint visit with the patient and did the substantive part of the visit and decision making.    A total of 30 minutes were spent today performing a complete history and physical exam, coordinating patient care, planning for follow up, reviewing history and performing appropriate documentation.      [name], MD

## 2023-06-08 ENCOUNTER — Telehealth (HOSPITAL_BASED_OUTPATIENT_CLINIC_OR_DEPARTMENT_OTHER): Payer: Self-pay | Admitting: Registered Nurse

## 2023-06-08 NOTE — Telephone Encounter (Addendum)
I called pt back to discuss her concerns,    No answer  message left on VM  Letting pt ,  that I am not sure if the back pain/swelling would be related to the WoUnd vac  And if the wound vac is full,  keep in place    Also that I will forward concerns,  if any recommendations  I will call back  Pt has an appt on 2/12 with Dr Lorel Monaco      Message sent to Dr Lorel Monaco and Frederick Peers PA             ----- Message from Acie Fredrickson sent at 06/08/2023  3:04 PM EST -----  Regarding: [facility] pt  Contact: [phone]  Pt called Wanted to talk to a nurse. Pt had SURGERY w/ [facility] on 05/19/23 for REVISION, TOTAL ARTHROPLASTY, KNEE, ALL COMPONENTS Right General  REPAIR, TENDON, PATELLAR [[id number] (CPT)]        Pt said her wound vac is not pumping, wondering if wound vac should be this heavy. Pt reports swollen lower back. Pt all frazzled on call w/o a clear message, but pt wants to ensure everything is "normal" for her recovery process. Please call and advise

## 2023-06-09 NOTE — Telephone Encounter (Signed)
Medication is ready for pick up in the [facility] pharmacy

## 2023-06-09 NOTE — Prior Authorization (Signed)
COVERED WITH $0 COPAY.

## 2023-06-10 ENCOUNTER — Other Ambulatory Visit (HOSPITAL_BASED_OUTPATIENT_CLINIC_OR_DEPARTMENT_OTHER): Payer: Self-pay | Admitting: Surgical

## 2023-06-10 ENCOUNTER — Ambulatory Visit: Payer: No Typology Code available for payment source | Admitting: Orthopaedic Surgery

## 2023-06-10 DIAGNOSIS — M25561 Pain in right knee: Secondary | ICD-10-CM

## 2023-06-10 DIAGNOSIS — Z96651 Presence of right artificial knee joint: Secondary | ICD-10-CM

## 2023-06-10 DIAGNOSIS — S86811D Strain of other muscle(s) and tendon(s) at lower leg level, right leg, subsequent encounter: Secondary | ICD-10-CM

## 2023-06-11 ENCOUNTER — Telehealth (HOSPITAL_BASED_OUTPATIENT_CLINIC_OR_DEPARTMENT_OTHER): Payer: Self-pay

## 2023-06-11 ENCOUNTER — Telehealth (HOSPITAL_BASED_OUTPATIENT_CLINIC_OR_DEPARTMENT_OTHER): Payer: Self-pay | Admitting: Registered Nurse

## 2023-06-11 NOTE — Telephone Encounter (Addendum)
-----   Message from [place] K sent at 06/11/2023 10:09 AM EST -----  Regarding: Cheryl Dixon - wound Reina Fuse, PT w/[facility] called to advise Cheryl Dixon, stating that Pt's wound vac isn't working and Pt has not been wearing for the last few days. Pt told Kathlene November it had topped pumping on Monday. Next appt: Post Op appt 2/14 @ 12 pm.

## 2023-06-11 NOTE — Telephone Encounter (Signed)
Cheryl Dixon, PT w/[facility] called to advise Cheryl Dixon, stating that Pt's wound vac isn't working and Pt has not been wearing for the last few days.     Pt told Cheryl Dixon it had topped pumping on Monday.     Next appt: Post Op appt 2/14 @ 12 pm.

## 2023-06-12 ENCOUNTER — Ambulatory Visit: Payer: No Typology Code available for payment source | Attending: Orthopaedic Surgery | Admitting: Orthopaedic Surgery

## 2023-06-12 ENCOUNTER — Telehealth (HOSPITAL_BASED_OUTPATIENT_CLINIC_OR_DEPARTMENT_OTHER): Payer: Self-pay | Admitting: Internal Medicine

## 2023-06-12 ENCOUNTER — Other Ambulatory Visit: Payer: Self-pay

## 2023-06-12 ENCOUNTER — Other Ambulatory Visit (HOSPITAL_BASED_OUTPATIENT_CLINIC_OR_DEPARTMENT_OTHER): Payer: Self-pay | Admitting: Internal Medicine

## 2023-06-12 DIAGNOSIS — F1120 Opioid dependence, uncomplicated: Secondary | ICD-10-CM

## 2023-06-12 DIAGNOSIS — Z96651 Presence of right artificial knee joint: Secondary | ICD-10-CM | POA: Diagnosis present

## 2023-06-12 MED ORDER — ESTROGENS CONJUGATED 0.625 MG/GM VA CREA
0.5000 g | TOPICAL_CREAM | Freq: Every day | VAGINAL | 2 refills | Status: DC
Start: 2023-06-12 — End: 2023-08-21

## 2023-06-12 MED ORDER — BUPRENORPHINE HCL-NALOXONE HCL 8-2 MG SL FILM
ORAL_FILM | SUBLINGUAL | 0 refills | Status: DC
Start: 2023-06-12 — End: 2023-07-10

## 2023-06-12 NOTE — Telephone Encounter (Signed)
Script for brand name suboxone sent with note to pharmacy to fill early.    Also refilled Premarin    Azzie Glatter, MD

## 2023-06-12 NOTE — Telephone Encounter (Signed)
-----   Message from Powell B sent at 06/12/2023 12:12 PM EST -----  Regarding: meds  Cheryl Dixon [id number], 68 year old, female    Calls today: Requesting call back to discuss meds    Person calling on behalf of patient: Daughter    [name] NUMBER: [phone]    Patient's PCP: Hildred Laser, MD    Primary Care Home Site:  [facility]

## 2023-06-12 NOTE — Telephone Encounter (Signed)
Called and spoke with patient and daughter.    Daughter reports,patient did not do well with generic Suboxone,she would like provider to send  non generic Suboxone   Reports with the generic,she waking up in the middle of night,experiencing nausea.  Reports,she does not want her mother to regressed.  Requesting non generic send to preferred pharmacy.  Reports,she also needs refill for premarin vaginal cream.    Advised,I will relay message.              ----- Message from Brooks B sent at 06/12/2023 12:12 PM EST -----  Regarding: meds  Jesilyn Easom [id number], 68 year old, female    Calls today: Requesting call back to discuss meds    Person calling on behalf of patient: Daughter    [name] NUMBER: [phone]    Patient's PCP: Hildred Laser, MD    Primary Care Home Site:  [facility]

## 2023-06-12 NOTE — Progress Notes (Unsigned)
Long Leg cast  applied by me under the direction of Dr Carolynne Edouard.    C/S/M normal after cast applied.    Cast/splint care Instructions given to and reviewed with patient.  Patient informed to call office if any pain or swelling occur.    Cheryl Dixon, 06/12/2023

## 2023-06-14 NOTE — Progress Notes (Signed)
Orthopedic Office Note    CC: Patient presents with:  Follow Up: Follow up- Right leg      ORTHOPEDIC PROBLEM LIST:  1. S/p right TKA, DOS: 03/11/2023, Doherty  2. Open right patella tendon rupture (grade 3), DOI: 03/26/2023, s/p poly exchange, irrigation/wound debridement, patellar tendon repair with cerclage cable, DOS: 03/29/2023, Lennie Muckle  3. S/p revision right TKA to constrained prosthesis, extensor mechanism reconstruction, patellectomy and ROH, DOS: 05/19/23, Lorel Monaco    HPI: Kerrilynn Derenzo is a 68 year old female who presents today for postop evaluation for her right knee. She is 3.5 weeks post op.     To review: Patient has had a complicated postop course regarding her right knee. She was initially a patient of Dr. Baldwin Jamaica who underwent a primary total knee replacement without complication on 03/11/2023. Unfortunately, she sustained a mechanical fall on 03/26/2023; she was sitting in a chair that was lower than she had expected; felt a slight popping sensation and immediately had pain, bleeding. She was found to have a 2.5 cm inferior wound dehiscence as well as evidence for a patellar tendon rupture. She was placed into a knee immobilizer. She subsequently had a new injury on 03/28/2023 where her right leg gave out while wearing the knee immobilizer causing increased bleeding. She was BIBA to the ED and admitted to the medical service in anticipation of surgical intervention. On exam she was found to have a grade 3 open dehiscence of a right total knee arthroplasty wound and subsequently underwent an open right patellar tendon rupture repair, irrigation and excisional debridement, antibiotic bead placement, extensor tendon tendon reinforcement with cerclage cable, poly exchange and closure. She followed up with Dr. Lucille Passy office on 04/14/2023 and x-ray/clinical evaluation showed evidence for failure of the patellar tendon repair with patella alta and displacement of the cable. She had an infectious  work up with Dr. Lorel Monaco that was negative; she underwent surgery on 05/19/23 and discharged home the next day after she was put into a long leg cast on the floor with a window for her Wound Vac.    Cast was modified 06/03/2023. She feels the cast is at the upper portion of her thigh and it is quite uncomfortable. No new injuries or traumas. Pain is controlled with Tylenol, Celebrex, oxycodone as needed.     No local/systemic symptoms/signs of infection.    She has been WBAT using a walker and mobilizes with wheelchair also.    Taking ASA 81 mg PO BID for DVTp.    IMAGING: x-ray of the right knee 2 views show a constrained TKA in good alignment. No evidence for hardware failure. Some cement lateral to the proximal tibia presumably from extrusion out of the hole from previous cerclage cable.        PHYSICAL EXAM:  GENERAL: Alert and oriented, in no acute distress  MOOD: Appropriate.   MUSCULOSKELETAL: Evaluation of the right lower extremity reveals no skin problems from the cast. Skin is warm, dry, intact. Surgical wound is healing well.  Nylon sutures in  place.  No signs of infection.  Thigh and calf soft and NTTP. Readily able to wiggle toes. NVI distally.    ASSESSMENT/PLAN: 68 year old female who presents today for cast change now 3.5 weeks postop from a right knee removal of hardware, revision total knee replacement to a constrained prosthesis, extensor mechanism reconstruction, patellectomy on 05/19/2023.  Continue WBAT using walker.  Wean narcotic meds.  Continue DVTp.  FU in 2 weeks  for re-evaluation and x-rays IN [facility].    We reviewed the likely diagnosis, the prognosis, and various treatment options in detail. Risks and benefits of treatment plan discussed. The patient's questions have been answered, and the patient understands agrees with treatment plan.     I answered all the patient's questions today to the best of my ability. Patient expressed understanding of an agreement with the plan. Patient will  return or report to the ER immediately with any new concerning symptoms for DVT, PE, or infection.     I spent a total of 30 minutes on this visit on the date of service (total time includes all activities performed on the date of service) In that time I performed a complete history and physical exam, reviewed and interpreted patient imaging, coordinated the next steps of the patients care, communicated the plan and diagnosis, placed orders, and preformed appropriate documentation.    Above is my independent interpretation of radiographic studies listed, which were reviewed by me today.      [name], MD      Nursing Communication:    Follow up in 2 weeks with new xrays.

## 2023-06-16 ENCOUNTER — Encounter (HOSPITAL_BASED_OUTPATIENT_CLINIC_OR_DEPARTMENT_OTHER): Payer: Self-pay | Admitting: Family Medicine

## 2023-06-16 ENCOUNTER — Ambulatory Visit: Payer: No Typology Code available for payment source | Attending: Family Medicine | Admitting: Family Medicine

## 2023-06-16 ENCOUNTER — Ambulatory Visit: Payer: No Typology Code available for payment source | Attending: Psychosomatic Medicine | Admitting: Clinical

## 2023-06-16 DIAGNOSIS — F3341 Major depressive disorder, recurrent, in partial remission: Secondary | ICD-10-CM | POA: Insufficient documentation

## 2023-06-16 DIAGNOSIS — F1120 Opioid dependence, uncomplicated: Secondary | ICD-10-CM | POA: Diagnosis present

## 2023-06-16 MED ORDER — BUPRENORPHINE HCL-NALOXONE HCL 8-2 MG SL FILM
ORAL_FILM | SUBLINGUAL | 0 refills | Status: DC
Start: 2023-06-16 — End: 2023-08-10

## 2023-06-16 NOTE — Progress Notes (Signed)
Cheryl Dixon is a 68 year old female seen in follow up for opioid use disorder:    Buprenorphine dose: 3.5 films a day rx'ed (previously 2.5 films a day)  Response, adequacy of dose: as above  Relapses/close calls: No  Meetings: attends every 4+ week  Non-prescribed substance use: None reported    Social history/events:   taking 5 a day because generic seems to be even weaker, not working well for her. Previously she was taking 2.5 films daily, has been gradually increased in the setting of pain w/ knee replacements.     Additional psychiatric or medical care/events:  S/p revision of right TKA    Present Medications:  estrogen, conjugated, (PREMARIN) 0.625 MG/GM vaginal cream, Place 0.5 g vaginally daily, Disp: 30 g, Rfl: 2  [DISCONTINUED] buprenorphine-naloxone (SUBOXONE) 8-2 MG sublingual film, Take 1.5 films under the tongue in the morning, 1 film midday, and 1 film at night, Disp: 98 Film, Rfl: 0  OTHER MEDICATION, Bedside commode  For use as directed.    DX: OA knee, gait instability  ICD10:M17.9, R26.81, Disp: 1 each, Rfl: 0  hydrocortisone 2.5 % cream, Apply topically 2 (two) times daily To affected areas of scalp and ears, Disp: 56 g, Rfl: 1  acetaminophen (TYLENOL) 500 MG tablet, Take 2 tablets by mouth every 8 (eight) hours as needed for Pain, Disp: 60 tablet, Rfl: 0  aspirin (ASPIRIN 81) 81 MG chewable tablet, Take 1 tablet by mouth in the morning and 1 tablet before bedtime., Disp: 56 tablet, Rfl: 0  celecoxib (CELEBREX) 200 MG capsule, Take 1 capsule by mouth daily, Disp: 30 capsule, Rfl: 0  polyethylene glycol (GLYCOLAX/MIRALAX) 17 g packet, Take 1 packet by mouth daily, Disp: 90 packet, Rfl: 3  albuterol HFA 108 (90 Base) MCG/ACT inhaler, Inhale 2 puffs into the lungs every 6 (six) hours as needed for Wheezing, Disp: , Rfl:   docusate sodium (COLACE) 100 MG capsule, TAKE 1 CAPSULE BY MOUTH IN THE MORNING AND BEFORE BEDTIME, Disp: 180 capsule, Rfl: 3  solifenacin (VESICARE) 10 MG tablet, TAKE 1 TABLET  BY MOUTH EVERY DAY IN THE MORNING, Disp: 90 tablet, Rfl: 0  tiotropium-olodaterol (STIOLTO RESPIMAT) 2.5-2.5 MCG/ACT inhal, Inhale 2 puffs into the lungs daily, Disp: , Rfl:   buPROPion (WELLBUTRIN SR) 200 MG 12 hr tablet, TAKE 1 TABLET BY MOUTH IN THE MORNING AND BEFORE BEDTIME, Disp: 180 tablet, Rfl: 3  cetirizine (ZYRTEC) 10 MG tablet, TAKE 1 TABLET BY MOUTH EVERY DAY IN THE MORNING, Disp: 90 tablet, Rfl: 3  VITAMIN D3 50 MCG (2000 UT) TABS tablet, TAKE 1 TABLET BY MOUTH EVERY DAY IN THE MORNING, Disp: 90 tablet, Rfl: 3  atorvastatin (LIPITOR) 80 MG tablet, Take 1 tablet by mouth Daily after dinner (Patient not taking: Reported on 05/27/2023), Disp: 90 tablet, Rfl: 3    No current facility-administered medications on file prior to visit.      Last UDS:  LAST UDS   AMPHETAMINES URINE (ng/mL)   Date Value   10/27/2022 NEGATIVE     COCAINE METABOLITES URINE (ng/mL)   Date Value   10/27/2022 NEGATIVE     OPIATES URINE (ng/mL)   Date Value   10/27/2022 NEGATIVE     BENZODIAZEPINES URINE (ng/mL)   Date Value   10/27/2022 NEGATIVE     CANNABINOIDS URINE (ng/mL)   Date Value   10/27/2022 POSITIVE (A)     ETHANOL URINE (mg/dL)   Date Value   54/27/0623 NEGATIVE  Review of Systems: no notable symptoms    PHYSICAL EXAMINATION: by video  General appearance - healthy female in no distress  Eyes - pupils normal  Skin - warm and dry  Neuro - nonfocal  Affect - normal    Problem List Items Addressed This Visit          Mental Health    Opioid dependence on agonist therapy (HCC)    Relevant Medications    buprenorphine-naloxone (SUBOXONE) 8-2 MG sublingual film            Today in group we discussed self compassion. [name] contributed.    Assessment:  Opioid use disorder  Comment: the patient actively participated and shared experiences, interacting appropriately with group without barriers to understanding  Plan:  Continue suboxone to 3.5 films daily dose divided TID (1.5 films in AM, 1 film mid day, and 1 film at  night) for pain control. Rx for brand name as PA was approved and generic is not effective for patient.   Patient is counseled regarding relapse prevention, involvement in recovery groups, and potential side effects of buprenorphine.    Azzie Glatter, MD, 06/16/2023

## 2023-06-16 NOTE — Progress Notes (Signed)
OUTPATIENT PSYCHIATRY GROUP PROGRESS NOTE    For the patient participating in the group via telephone and/or video technology: The patient/guardian has verbally consented to participate in this group therapy session by televisit. The patient/guardian was encouraged to be in a private location due to personal health information being discussed and where they could not be overheard by individuals not participating in the group therapy. Although all [facility] staff/providers participating in this group therapy televisit are in a private location, all the risks of telephone and/or video technology used to conduct this group therapy session cannot be controlled by [place], (i.e. interruptions, unauthorized access/recording and technical difficulties).  Each patient understands that the visit can be stopped at any time for any reason, including if the conferencing connections are inadequate.  Cheryl Dixon is a 68 year old, Divorced, female, who presented for [facility] group visit.   Length of group: 60 min which included 30 min of Health Behavior Intervention and OUD Psychoeducation.    Group Name: OBOT: Office Based Opioid Treatment Group    Group Topic Today: Self-Compassion and Overcoming Setbacks. Members explored the importance of self-compassion in recovery, particularly during times when progress feels slow or setbacks occur. The group discussed strategies for being kind to oneself, including practicing patience, positive self-talk, and focusing on small, manageable steps. Members learned how to reframe setbacks as learning opportunities and avoid all-or-nothing thinking, which can be detrimental to progress. The session emphasized building resilience through self-compassion and creating a balanced, realistic approach to recovery. Group discussions encouraged reflection on personal experiences and practical application of the strategies presented.    Leaders:  Azzie Glatter, MD, Milta Deiters, Ph.D., and Boneta Lucks,  RN    Service Type: [phone] Group Psychotherapy     Number of participants in group today:  12        Purpose of Group (choose all that apply):   Skills training/rehab  Support (psychological, family, community resources, recovery)  Insight and behavior change  Increasing health promoting behaviors  Relapse prevention and enhancing Recovery Process  Education about Opioid Use Disorder (OUD) and treatment  Enhancing adjustment and coping with OUD  Improving management of OUD and treatment adherence  Decreasing health risk behaviors and risks of relapse  Addressing psychological/psychosocial/behavioral barriers to preventing addiction and relapse        Group Process:    Review of group goals/structure  Education about Opioid Use Disorder (OUD) and related topics  Group discussion and sharing.    Review of strategies to increase healthy coping mechanisms to support recovery and change.  Review of additional relapse prevention strategies.   Discussion of support strategies  Check in    Individual Patient Participation:    Level of participation in the group: Cheryl Dixon  rejoined the group after having her knee replacement surgery. She presented as engaged and participated in the group discussion about self-compassion in recovery. Overall, the patient appeared attentive to the group process and contributed when prompted.   Subjective report of pt's progress: Pt reported concerns about her Suboxone brand and dosage: she is taking 5 a day because generic seems to be weaker and not working well for her. She reported that she was receiving her Suboxone at the hospital. She is not recovering after the surgery and feels that she is "at the house too much".   Social History update: no major updates reported  Participation in meetings outside of this group: Not currently  Report on relapses and close calls:  No relapse/close  calls disclosed.       Medical Necessity of Session (how treatment is necessary to improve symptoms,  functioning, or prevent worsening): treatment is necessary to enhance compliance with agonist treatment and maintain abstinence from opioids. Suboxone group participation is mandatory to continue Suboxone prescription and is necessary in order to enhance coping with Opioid Use Disorder.      Relevant Changes in Mental Status: No. No acute issues reported or noted     Risk Level per Scale:  Suicide: re-assessment not indicated today  Violence: re-assessment not indicated today  Addiction: low     Current risk level represents increase in risk: No.     Plan: Pt will continue OBOT treatment     DIAGNOSES:     Opioid dependence on agonist therapy (HCC)  (primary encounter diagnosis)   SNOMED CT(R): OPIOID DEPENDENCE, ON AGONIST THERAPY     MDD (major depressive disorder), recurrent, in partial remission (HCC)   SNOMED CT(R): RECURRENT MAJOR DEPRESSION IN PARTIAL REMISSION      REVIEWING TODAY'S VISIT:   Reviewing Today's Visit:  Clinical Interventions Today: Motivational Interviewing; Cognitive Behavioral Therapy (CBT); Relapse Prevention for SUDs  Patient's Response to Interventions: Responsive  Time Spent in Psychotherapy: 60 min            CARE PLAN/ EPISODES:  Linked Episodes   Type: Episode: Status: Noted: Resolved: Last update: Updated by:   PCBHI PCBHI OBOT  Active 09/11/2020  06/16/2023 11:41 AM Milta Deiters, PhD      Comments:       Patient/Guardian:       Strengths/Skills: Help seeking and motivated to get better     Potential Barriers: Multiple life stressors       Patient stated goals: Pt would like to prevent relapses and support her recovery process     Problem 1: Opioid Dependence on Agonist Therapy (Opioid Use Disorder on Agonist Therapy)      Short Term Goals: Pt will use at least one copying skill a day to reduce a risk of relapse, enhance coping with OUD, and support her recovery process     Short Term Target:  60 days  Short TermTarget Date: 07/04/2023  Short Term Goal #1 Progress:       Long Term  Goals: Pt will report 50% in urges and cravings reduction and no relapses or close calls       Long Term Target:  90 days  Long TermTarget Date:  08/03/2023   Long Term Goal #1 Progress:         Intervention #1: PCBHI-based OBOT/Group Psychotherapy   Intervention Frequency:  biweekly  Intervention Duration:  90 Days  Intervention Responsibility:  J. Maryann Conners, PhD and the OBOT team  Milta Deiters, PhD

## 2023-06-17 NOTE — Telephone Encounter (Signed)
RX for COMMODE received however we are still in need of visit notes supporting the need for this supply.     ONE OF THE FIRST 3 OPTIONS MUST BE "YES", and this must be included in a recento ffice visit note.     Is the patient confined to a single room?    Is the patient confined to one level of the home environment where no toilet is available at that level?    Is the patient confined to the home where there are no toilet facilities in the home?    Is a commode chair with detachable arms necessary to facilitate transferring the patient?    Does the patient have a body configuration that requires additional seat width.         *Please notify [facility] when this is complete.    Thank you

## 2023-06-19 ENCOUNTER — Ambulatory Visit (HOSPITAL_BASED_OUTPATIENT_CLINIC_OR_DEPARTMENT_OTHER): Payer: Self-pay | Admitting: Registered Nurse

## 2023-06-19 ENCOUNTER — Ambulatory Visit: Payer: No Typology Code available for payment source | Attending: Internal Medicine | Admitting: Physician Assistant

## 2023-06-19 ENCOUNTER — Encounter (HOSPITAL_BASED_OUTPATIENT_CLINIC_OR_DEPARTMENT_OTHER): Payer: Self-pay | Admitting: Physician Assistant

## 2023-06-19 ENCOUNTER — Other Ambulatory Visit: Payer: Self-pay

## 2023-06-19 VITALS — BP 152/71 | HR 84 | Temp 97.6°F | Wt 196.0 lb

## 2023-06-19 DIAGNOSIS — Z96651 Presence of right artificial knee joint: Secondary | ICD-10-CM | POA: Diagnosis not present

## 2023-06-19 DIAGNOSIS — G4700 Insomnia, unspecified: Secondary | ICD-10-CM | POA: Diagnosis not present

## 2023-06-19 DIAGNOSIS — F411 Generalized anxiety disorder: Secondary | ICD-10-CM | POA: Insufficient documentation

## 2023-06-19 MED ORDER — CELECOXIB 200 MG PO CAPS
200.0000 mg | ORAL_CAPSULE | Freq: Every day | ORAL | 0 refills | Status: DC
Start: 2023-06-19 — End: 2023-07-16

## 2023-06-19 MED ORDER — ACETAMINOPHEN 500 MG PO TABS
1000.0000 mg | ORAL_TABLET | Freq: Three times a day (TID) | ORAL | 0 refills | Status: AC | PRN
Start: 2023-06-19 — End: 2023-07-19

## 2023-06-19 MED ORDER — HYDROXYZINE HCL 25 MG PO TABS
25.0000 mg | ORAL_TABLET | Freq: Every evening | ORAL | 0 refills | Status: DC | PRN
Start: 2023-06-19 — End: 2023-08-19

## 2023-06-19 MED ORDER — OXYCODONE HCL 5 MG PO TABS
5.0000 mg | ORAL_TABLET | Freq: Three times a day (TID) | ORAL | 0 refills | Status: DC | PRN
Start: 2023-06-19 — End: 2023-06-25

## 2023-06-19 NOTE — Progress Notes (Signed)
 Cheryl Dixon is a 68 year old female pt of Dr. Hildred Laser, MD who presents for insomnia      Insomnia  Having a lot of anxiety and trouble sleeping in the setting of several recent knee surgeries  When it gets close to bedtime she feels very anxious  Very light sleeper wakes up easily  Hard to fall asleep  Is always had trouble sleeping but has been a lot worse since surgeries  No nightmares  Does think about the surgeries often but no flashbacks  Reports she tried trazodone in the past and it was not helpful  Also recently tried Buspar for anxiety and it was not helpful      Right knee replacement  Had right TKA 02/2023  Complicated course after this when she had wound dehiscence and rupture of right patella tendon due to a fall  Needed 2 more surgeries after this  Currently in a cast  Had follow-up with Ortho 1 week ago and will follow-up again next week  Has been taking Celebrex and Tylenol for pain  Was also getting oxycodone as needed for pain  Continues buprenorphine but reports that this does not really provide any pain relief  Patient was most recently taking 10 mg oxycodone twice daily  States she ran out of this about a week ago and thought she would be fine without it  However still finding that she gets a lot of pain      BP (!) 152/71   Pulse 84   Temp 97.6 F (36.4 C) (Temporal)   Wt 88.9 kg (196 lb)   LMP 11/20/1991   SpO2 97%   BMI 37.03 kg/m   Pain Score: Data Unavailable            Physical Exam  Vitals reviewed.   Constitutional:       General: She is not in acute distress.     Appearance: Normal appearance.   Musculoskeletal:      Comments: Right leg cast to mid thigh   Neurological:      Mental Status: She is alert.   Psychiatric:         Mood and Affect: Mood is anxious. Affect is tearful.           ASSESSMENT/PLAN        (F41.1) Anxiety state  (primary encounter diagnosis)  (G47.00) Insomnia, unspecified type  Comment: Patient presents with insomnia and anxiety in the setting of recent  multiple knee surgeries due to complications from right knee replacement.  Reports that she feels very worried at night because she does not sleep well which makes sleeping worse.  States she thinks about her surgeries but no nightmares or flashbacks to suggest PTSD.  Has always had difficulty sleeping and tried trazodone which she reports was ineffective.  Also recently tried BuSpar for anxiety which was not effective.  Currently takes Wellbutrin for depression which she does report is helpful.  Considered SSRI for anxiety but I think that will take too long to notice an effect and I suspect that as she recovers from the surgery sleep and mood will improve.  Therefore will trial hydroxyzine for sleep and anxiety at bedtime  Plan: Trial hydroxyzine 25 mg at night            Warned about likely sedation            Follow-up if not improving      (Z96.651) S/P revision of total knee, right  Comment: Followed by Ortho for total knee replacement as noted above.  Had complicated course including a fall after knee replacement which resulted in patellar tendon rupture and needing 2 additional surgeries.  Now she is in a cast and reports she is still having pain.  Takes Celebrex and Tylenol which help with her where she reports this is not enough.  Was most recently taking oxycodone as well, 10 mg twice daily.  She ran out about a week ago and reports since then pain has been worse.  Almost 5 weeks out from surgery so I think reasonable to provide a little more oxycodone but we do need to taper off.  Discussed we could try oxycodone 5 mg 3 tabs daily and she agrees.  Spoke with her Suboxone provider Dr. Alisa Graff who is in agreement with continued oxycodone for now  Plan: acetaminophen (TYLENOL) 500 MG tablet,         oxyCODONE (ROXICODONE) 5 MG immediate release         tablet        Continue Celebrex and Tylenol for pain with oxycodone as needed for breakthrough pain        Sent oxycodone 5 mg #21, and if patient states she  needs a refill we discussed we would begin to taper down to 2 daily and then 1 and then stop         Discussed risk of sedation in combination with all other medications, urged caution and not to combine oxycodone with hydroxyzine          Follow-up with Ortho as planned        We discussed the patient's current medications. The patient expressed understanding and no barriers to adherence were identified.   1. The patient indicates understanding of these issues and agrees with the plan. Brief care plan is updated and reviewed with the patient.   2. The patient is given an After Visit Summary sheet that lists all medications with directions, allergies, orders placed during this encounter, and follow-up instructions.   3. I reviewed the patient's medical information and medical history   4. I reconciled the patient's medication list and prepared and supplied needed refills.   5. I have reviewed the past medical, family, and social history sections including the medications and allergies.      I spent a total of 46 minutes on this visit on the date of service (total time includes all activities performed on the date of service)

## 2023-06-19 NOTE — Telephone Encounter (Signed)
 Reason for Disposition   Insomnia is an ongoing problem (> 2 weeks)    Protocols used: Insomnia-A-OH  Pt calling + anxiety and insomnia since she got her surgery  At 9pm in bed pt knows she's going to bed and gets anxious  Often pt doesn't even know if she falls asleep or not  Thinks she is getting 3/4 hrs sleep only per night  Advised per nursing triage protocol.      Recommended disposition for patient:Disposition: See in Office within 2 weeks    If patient referred to UC/ED advised that they may require further follow up and testing after the visit with their primary care office.     Instructed patient to call back for any new, worsening, or worrisome symptoms or concerns any time day or night.

## 2023-06-22 LAB — FUNGAL CULTURE

## 2023-06-23 ENCOUNTER — Telehealth (HOSPITAL_BASED_OUTPATIENT_CLINIC_OR_DEPARTMENT_OTHER): Payer: Self-pay | Admitting: Registered Nurse

## 2023-06-23 ENCOUNTER — Telehealth (HOSPITAL_BASED_OUTPATIENT_CLINIC_OR_DEPARTMENT_OTHER): Payer: Self-pay

## 2023-06-23 ENCOUNTER — Ambulatory Visit (HOSPITAL_BASED_OUTPATIENT_CLINIC_OR_DEPARTMENT_OTHER): Payer: Self-pay | Admitting: Ambulatory Care

## 2023-06-23 NOTE — Telephone Encounter (Signed)
 Spoke w/ Patient   Confirmed pt's identity using 2 patient identifiers.     Pt is only taking tylenol, but she is still having swelling to right lower extremity since 06/20/23 x 3 days  Pt had a hamburger and french fries 06/20/23  Pt says she did not have swelling when she was in on 06/19/23  Pt has a cast from toe to mid thigh- previous casts never felt tight like this  Pt has had 3 surgeries since Nov 2024 for her knee  Pt left the hospital May 25 2023 for the last surgery  Swelling and pain is not different from when pt was evaluated 06/19/23  Pt stopped taking Celebrex as she thought it was causing swelling  Pt is taking Oxycodone and it only helps for 1- 2 hrs  Pain ranges from 0 - 8 /10  Pt feels like discomfort is from the swelling  No redness to legs or toes  Pt does take her Suboxone  Pt says she cannot take Ibuprofen, Motrin, or Naproxen    Pt says she drinks water constantly  Pt is urinating well  Toes are not swollen  "I have my leg over my head all day..I sleep with my leg elevated"  Pt is noticing swelling in upper extremities and hands  " I do not feel like I am letting out as much as I am taking in"  Pt says she can move around and walk carefully and is cautious to not stay too still to prevent blood clots     Wound vac removed last week.    Advised per nursing triage protocol.      Recommended disposition for patient:  [facility] [phone] and spoke w/ Jen-Dr. Lorel Monaco does not have any openings until 06/29/2023.  Msg will be sent to Dr. Lorel Monaco and his team by [facility] front desk for someone to reach out to pt today    If patient referred to UC/ED advised that they may require further follow up and testing after the visit with their primary care office.     Instructed patient to call back for any new, worsening, or worrisome symptoms or concerns any time day or night.        Reason for Disposition   Cast on leg or ankle and has increasing  pain    Protocols used: Leg Swelling and Edema-A-OH

## 2023-06-23 NOTE — Telephone Encounter (Signed)
 Pt called w/ Vikki Ports (triage nurse) reporting severe pain and swelling in Rt Lwr Ext for 3 days.   Pt states pain 0-8/10. Bilateral knee swelling, cast is tight. Pt Post Op 1/27, Next appt 3/3. Please advise.    Please call Pt at [phone] to advise.

## 2023-06-23 NOTE — Telephone Encounter (Addendum)
 I called pt back to discuss  Pt having bil leg swelling,  pt describes her leg is swollen  A lot of swelling yesterday    Today swelling is less,  has elevated her leg,  had moved,    Pt then stating she did eat some extra salty foods on Saturday and maybe its that    Pt also asking if the MD can give her a fluid pill,  I let pt know  that Orthopedics does not prescribe fluid pills and she should discuss or have an Eval with her PCP    Pt also stating that she stopped her Tylenol and Oxycodone today ,  thinking it is making her swollen,      I asked pt what she will do for pain management,  pt stating she will be ok     I suggest pt to cont to elevate the leg on and off,  cont to move on and off during the day    I will forward to the MD and if any recommendations  will call back  Pt in agreement,    Message sent to A Leahey PA and Dr Lorel Monaco               ----- Message from Viro[place] K sent at 06/23/2023  2:30 PM EST -----  Regarding: Lorel Monaco - Post Op  Contact: [phone]  Pt called w/ Vikki Ports (triage nurse) reporting severe pain and swelling in Rt Lwr Ext for 3 days.   Pt states pain 0-8/10. Bilateral knee swelling, cast is tight. Pt Post Op 1/27, Next appt 3/3. Please advise.    Please call Pt at [phone] to advise.

## 2023-06-24 ENCOUNTER — Ambulatory Visit
Payer: No Typology Code available for payment source | Admitting: Student in an Organized Health Care Education/Training Program

## 2023-06-24 ENCOUNTER — Encounter (HOSPITAL_BASED_OUTPATIENT_CLINIC_OR_DEPARTMENT_OTHER): Payer: Self-pay | Admitting: Registered Nurse

## 2023-06-24 ENCOUNTER — Ambulatory Visit (HOSPITAL_BASED_OUTPATIENT_CLINIC_OR_DEPARTMENT_OTHER): Payer: Self-pay

## 2023-06-24 DIAGNOSIS — Z96651 Presence of right artificial knee joint: Secondary | ICD-10-CM | POA: Diagnosis not present

## 2023-06-24 DIAGNOSIS — F419 Anxiety disorder, unspecified: Secondary | ICD-10-CM | POA: Diagnosis not present

## 2023-06-24 DIAGNOSIS — F332 Major depressive disorder, recurrent severe without psychotic features: Secondary | ICD-10-CM

## 2023-06-24 DIAGNOSIS — N3281 Overactive bladder: Secondary | ICD-10-CM | POA: Diagnosis not present

## 2023-06-24 NOTE — Telephone Encounter (Signed)
 Reason for Disposition  . Patient wants to be seen    Answer Assessment - Initial Assessment Questions  Spoke with pt who c/o R leg swelling pt in cast from recent surgery, has had 3 surgeries since Nov.  leg swelling r/t this last surgery since Sunday.   Called yesterday also to c/o same sx, spoke with ortho RN briefly was to hear back from ortho provider reports no call yet.   Reports elevating legs over head, no swelling in toes or thigh, able to wiggle toes and ankle slightly inside cast. Describes edema and moderate  Reports leg feels about the same as yesterday.     Left leg edema better today able to see her knee cap today.   Pt reports she stopped taking ASA, tylenol and oxycodone on her own thinking that was the reason for her swelling. Continue on suboxone.   Advised pt to resume ASA use, DVTp.   Using walker and wheelchair. WBAT.    Reports she has been drinking a lot of fluids and c/o decreased urine output, bladder feels full. takes solifenacin daily, sx started Monday night   Has urge to urinte with decreased out put, often sitting on toilet waiting for some time. No strong odor or cloudiness  urine is clear to light yellow, no burning sensation, frequency, urgency  No fever +chills  Denies Chest pain, sob,  dif breathing (baseline COPD)  Pt depends on the [facility] for transportation, can not do same day apt    Televisit sch, msg to ortho to check in f/up on pt today regarding edema and pain management    Protocols used: Leg Swelling and Edema-A-OH

## 2023-06-24 NOTE — Progress Notes (Unsigned)
 TELEVISIT  Conducted via: phone only  Confirmed patient's location and DOB prior to beginning call    SUBJECTIVE:    Cheryl Dixon a 68 year old female patient of Roll, Onalee Hua, MD presents for concerns for swelling and urinary retention    HPI:    Called [facility] earlier today with concern for swelling in her leg after recent surgery, as well as urinary retention  Has had multiple surgeries on her knee since December  Feeling quite anxious today  Notes that she typically retains a lot of fluid  Is worried that her knee is swollen under the case  Denies fevers, chills, redness of visible skin  No swelling of her feet  Swelling has gone down since her initial phone call with the nurse  Stopped all pain meds yesterday-- tylenol, oxycodone, in case they were contributing to swelling    Takes solificen for OAB  Had been drinking a lot of fluids yesterday and was having trouble urinating  Didn't take the solificen today, and now can finally urinate  Denies dysuria  Was having some constipation on the oxycodone, but has meds to treat this    Had [facility] visit on Friday for anxiety, insomnia  Still having poor sleep, tried hydroxyzine, didn't help with her sleep much, now back to benadryl  Taking wellbutrin  Interested in talk therapy    REVIEW OF SYSTEMS: negative or noted above    Current Outpatient Medications   Medication Sig    hydrOXYzine (ATARAX) 25 MG tablet Take 1 tablet by mouth nightly as needed for Itching    celecoxib (CELEBREX) 200 MG capsule Take 1 capsule by mouth daily    acetaminophen (TYLENOL) 500 MG tablet Take 2 tablets by mouth every 8 (eight) hours as needed for Pain    oxyCODONE (ROXICODONE) 5 MG immediate release tablet Take 1 tablet by mouth every 8 (eight) hours as needed for Pain Max Daily Amount: 15 mg  for up to 7 days    buprenorphine-naloxone (SUBOXONE) 8-2 MG sublingual film Take 1.5 films under the tongue in the morning, 1 film midday, and 1 film at night    estrogen, conjugated, (PREMARIN) 0.625 MG/GM  vaginal cream Place 0.5 g vaginally daily    OTHER MEDICATION Bedside commode  For use as directed.    DX: OA knee, gait instability  ICD10:M17.9, R26.81    hydrocortisone 2.5 % cream Apply topically 2 (two) times daily To affected areas of scalp and ears    polyethylene glycol (GLYCOLAX/MIRALAX) 17 g packet Take 1 packet by mouth daily    albuterol HFA 108 (90 Base) MCG/ACT inhaler Inhale 2 puffs into the lungs every 6 (six) hours as needed for Wheezing    docusate sodium (COLACE) 100 MG capsule TAKE 1 CAPSULE BY MOUTH IN THE MORNING AND BEFORE BEDTIME    tiotropium-olodaterol (STIOLTO RESPIMAT) 2.5-2.5 MCG/ACT inhal Inhale 2 puffs into the lungs daily    buPROPion (WELLBUTRIN SR) 200 MG 12 hr tablet TAKE 1 TABLET BY MOUTH IN THE MORNING AND BEFORE BEDTIME    cetirizine (ZYRTEC) 10 MG tablet TAKE 1 TABLET BY MOUTH EVERY DAY IN THE MORNING    VITAMIN D3 50 MCG (2000 UT) TABS tablet TAKE 1 TABLET BY MOUTH EVERY DAY IN THE MORNING    atorvastatin (LIPITOR) 80 MG tablet Take 1 tablet by mouth Daily after dinner (Patient not taking: Reported on 05/27/2023)     No current facility-administered medications for this visit.     OBJECTIVE:  Gen:  Well-appearing, speaking normally  Psych: Normal speech and thought process    LABS/IMAGING:    ASSESSMENT AND PLAN:    (F41.9) Anxiety  (primary encounter diagnosis)  (F33.2) Severe episode of recurrent major depressive disorder, without psychotic features (HCC)  Comment: feels wellbutrin is managing her depression, but her anxiety sx have significantly worsened recently in the context of her multiple surgeries. Patient is interested in tall therapy referral today. Did not   Plan: RFL TO ADULT OUTPT PSYCH (NEW) [facility]    (N32.81) Overactive bladder  Comment: was having urinary retention sx that have now resolved; discussed how constipation, antihistamines can contribute. OK to hold solificen when experiencing retention. Return precautions discussed    (S06.301) Status post total  right knee replacement  Comment: spoke to ortho yesterday about her knee sx; today I advised she re-start her tylenol, celebrex for better pain control as these are not likely contributing to her swelling, has in person ortho fu on Monday, discussed reasons to call them sooner    Return precautions discussed with patient. Patient verbalizes understanding and is in agreement with plan.      Carmelia Bake, MD  Family Medicine

## 2023-06-24 NOTE — Progress Notes (Signed)
 Patient has been present at all scheduled online google groups for suboxone.  Participates in group discussion.  Still struggling with remnants of recent surgeries.  She does have a good outlook for recovery.  Denies any cravings or close calls.  Stable on current dose of suboxone.  Otherwise well.  Denies any other c/o.  Chart review complete.

## 2023-06-25 ENCOUNTER — Emergency Department
Admission: EM | Admit: 2023-06-25 | Discharge: 2023-06-26 | Disposition: A | Discharge status: Home / Self Care | Payer: No Typology Code available for payment source | Attending: Emergency Medicine | Admitting: Emergency Medicine

## 2023-06-25 ENCOUNTER — Ambulatory Visit (HOSPITAL_BASED_OUTPATIENT_CLINIC_OR_DEPARTMENT_OTHER): Payer: Self-pay

## 2023-06-25 ENCOUNTER — Other Ambulatory Visit (HOSPITAL_BASED_OUTPATIENT_CLINIC_OR_DEPARTMENT_OTHER): Payer: Self-pay | Admitting: Internal Medicine

## 2023-06-25 ENCOUNTER — Encounter (HOSPITAL_BASED_OUTPATIENT_CLINIC_OR_DEPARTMENT_OTHER): Payer: Self-pay | Admitting: Student in an Organized Health Care Education/Training Program

## 2023-06-25 ENCOUNTER — Emergency Department (HOSPITAL_BASED_OUTPATIENT_CLINIC_OR_DEPARTMENT_OTHER): Payer: No Typology Code available for payment source

## 2023-06-25 DIAGNOSIS — S86811D Strain of other muscle(s) and tendon(s) at lower leg level, right leg, subsequent encounter: Secondary | ICD-10-CM | POA: Diagnosis not present

## 2023-06-25 DIAGNOSIS — I7121 Aneurysm of the ascending aorta, without rupture: Secondary | ICD-10-CM | POA: Diagnosis not present

## 2023-06-25 DIAGNOSIS — R7989 Other specified abnormal findings of blood chemistry: Secondary | ICD-10-CM | POA: Diagnosis not present

## 2023-06-25 DIAGNOSIS — Z96651 Presence of right artificial knee joint: Secondary | ICD-10-CM | POA: Diagnosis not present

## 2023-06-25 DIAGNOSIS — I288 Other diseases of pulmonary vessels: Secondary | ICD-10-CM | POA: Diagnosis not present

## 2023-06-25 DIAGNOSIS — M79604 Pain in right leg: Secondary | ICD-10-CM

## 2023-06-25 DIAGNOSIS — R079 Chest pain, unspecified: Secondary | ICD-10-CM | POA: Insufficient documentation

## 2023-06-25 DIAGNOSIS — M7989 Other specified soft tissue disorders: Secondary | ICD-10-CM | POA: Diagnosis not present

## 2023-06-25 DIAGNOSIS — M25561 Pain in right knee: Secondary | ICD-10-CM | POA: Diagnosis not present

## 2023-06-25 DIAGNOSIS — R61 Generalized hyperhidrosis: Secondary | ICD-10-CM

## 2023-06-25 DIAGNOSIS — S86811S Strain of other muscle(s) and tendon(s) at lower leg level, right leg, sequela: Secondary | ICD-10-CM

## 2023-06-25 DIAGNOSIS — R0602 Shortness of breath: Secondary | ICD-10-CM | POA: Diagnosis not present

## 2023-06-25 DIAGNOSIS — Z4689 Encounter for fitting and adjustment of other specified devices: Secondary | ICD-10-CM | POA: Diagnosis not present

## 2023-06-25 LAB — BASIC METABOLIC PANEL
ANION GAP: 12 mmol/L (ref 10–22)
BUN (UREA NITROGEN): 14 mg/dL (ref 7–18)
CALCIUM: 9.6 mg/dL (ref 8.5–10.5)
CARBON DIOXIDE: 25 mmol/L (ref 21–32)
CHLORIDE: 103 mmol/L (ref 98–107)
CREATININE: 0.8 mg/dL (ref 0.4–1.2)
ESTIMATED GLOMERULAR FILT RATE: >60 mL/min (ref >60)
Glucose Random: 120 mg/dL (ref 74–160)
POTASSIUM: 4 mmol/L (ref 3.5–5.1)
SODIUM: 140 mmol/L (ref 136–145)

## 2023-06-25 LAB — TROPONIN T HS BASELINE: TROPONIN T HS BASELINE: 11 ng/L — ABNORMAL HIGH (ref 0–10)

## 2023-06-25 MED ORDER — MORPHINE SULFATE 4 MG/ML IV SOLN (SUPER ERX)
4.0000 mg | Freq: Once | Status: AC
Start: 2023-06-25 — End: 2023-06-26
  Administered 2023-06-26: 4 mg via INTRAVENOUS
  Filled 2023-06-25: qty 1

## 2023-06-25 MED ORDER — OXYCODONE HCL 5 MG PO TABS
5.0000 mg | ORAL_TABLET | Freq: Three times a day (TID) | ORAL | 0 refills | Status: DC | PRN
Start: 2023-06-25 — End: 2023-07-07

## 2023-06-25 MED ORDER — ONDANSETRON HCL 4 MG/2ML IJ SOLN
4.0000 mg | Freq: Once | INTRAMUSCULAR | Status: AC
Start: 2023-06-25 — End: 2023-06-26
  Administered 2023-06-26: 4 mg via INTRAVENOUS
  Filled 2023-06-25: qty 2

## 2023-06-25 NOTE — Telephone Encounter (Signed)
Informed pt of provider message  She verbalized understanding.

## 2023-06-25 NOTE — ED Triage Note (Signed)
 Brought in by [facility] BLS from home for right knee pain s/p total knee replacement. Right knee in cast due to repeated surgery for wound dehiscence per patient.    Per report, initial call for chest pain and SOB.   Per report, on scene ALS cleared patient for BLS transport after EKG was done.  On arrival, patient c/o right knee pain .  Also c/o chest tightness and SOB.  Placed on cardiac monitor.

## 2023-06-25 NOTE — Telephone Encounter (Signed)
 Answer Assessment - Initial Assessment Questions  Pt requesting to speaking with Dr. Chanda Busing last oxycodone this morning feeling more anxious panicky, questioning if she is going into withdrawals,  Reports feeling cold sweats, shaky, did take her suboxone twice today. second dose about 30-40 min ago.   Has been up since 4am, is talking with friends and family to stay busy, keep her mind off feeling anxious.   Reports feeling more anxious when she is along, does not want to sleep in her bedroom, states maybe she is afraid of hurting herself cause bed is high.   Reports if pcp going to take her off oxycodone likely will need to taper asking to speak with him ASAP.   Per chart review he noted he would try to reach pt today.   Advised I will send msg asking him to call her.  Reassurance and support offered, pt appreciated.    Protocols used: Anxiety and Panic Attack-A-OH

## 2023-06-25 NOTE — Telephone Encounter (Signed)
 Very anxious  Blisters on foot.  Hoping to get cast off on Monday, and may need more.  Stopped solefenicin because of urinary retention.  Continue current meds for now  We'll talk on 3/11 about tapering meds and maybe trying different treatment for anxiety

## 2023-06-25 NOTE — Telephone Encounter (Signed)
 PER Patient (self),  Cheryl Dixon is a 68 year old female has requested a refill of oxycodone      Last prescribed - start date: 06/19/23 end date: 06/26/23           Last Office Visit: Millet, A  Last Physical Exam: n/a        Other Med Adult:  Most Recent BP Reading(s)  06/19/23 : Marland Kitchen 152/71        Cholesterol (mg/dL)   Date Value   16/01/9603 212     LOW DENSITY LIPOPROTEIN DIRECT (mg/dL)   Date Value   54/12/8117 151     HIGH DENSITY LIPOPROTEIN (mg/dL)   Date Value   14/78/2956 47     TRIGLYCERIDES (mg/dL)   Date Value   21/30/8657 119         THYROID SCREEN TSH REFLEX FT4 (uIU/mL)   Date Value   08/20/2022 4.100         No results found for: "TSH"    HEMOGLOBIN A1C (%)   Date Value   08/20/2022 5.6       No results found for: "POCA1C"      INR (no units)   Date Value   12/12/2021 1.1   02/16/2007 1.0 (L)   07/03/2006 < 1.0 (L)       SODIUM (mmol/L)   Date Value   05/25/2023 138       POTASSIUM (mmol/L)   Date Value   05/25/2023 4.4           CREATININE (mg/dL)   Date Value   84/69/6295 0.8        Documented patient preferred pharmacies:    [id number] - [place], [place] - [address]  Phone: [phone] Fax: [phone]    [facility], [place], [place] - [address]  Phone: [phone] Fax: [phone]    [facility], [place],  - [address]  Phone: [phone] Fax: [phone]

## 2023-06-25 NOTE — Telephone Encounter (Signed)
 I called to talk to patient about tapering oxycodone or switching to tramadol.  I'll try to call her back later today.

## 2023-06-25 NOTE — Telephone Encounter (Signed)
 I spoke to pt  Continue medication for now  F/u televisit 3/11

## 2023-06-25 NOTE — ED Provider Notes (Signed)
 The patient was seen primarily by me. ED nursing record was reviewed. Prior records as available electronically through the Epic record were reviewed.       I have reviewed prior nursing notes, prior records and outside records for this patient.       Triage Documentation       Dennard Schaumann, RN 06/25/2023 23:11                 Brought in by [facility] from home for right knee pain s/p total knee replacement. Right knee in cast due to repeated surgery for wound dehiscence per patient.    Per report, initial call for chest pain and SOB.   Per report, on scene ALS cleared patient for BLS transport after EKG was done.  On arrival, patient c/o right knee pain .  Also c/o chest tightness and SOB.  Placed on cardiac monitor.             HPI:    Cheryl Dixon is a 68 year old female with PMH of  has a past medical history of Anxiety, Arthritis, COPD (chronic obstructive pulmonary disease) (HCC), Depression, Obese, Stroke (HCC), Substance addiction (HCC), and Varicose veins of lower extremities.    She has no past medical history of Adverse effect of anesthesia, Malignant hyperthermia, PONV (postoperative nausea and vomiting), or Sleep apnea. presenting with chief complaint of a variety of complaints.  Patient is status post right knee surgery revision in January, has had a cast in place, but tonight started feeling some chest pain, shortness of breath, mild diaphoresis.  States she has chronic right knee pain that has not changed.  She is due to follow-up with her orthopedist on Monday.  States her cast has been really bothering her and she has not been able to sleep.  Was initially on blood thinner Eliquis however has not been on it as she was already on it for 3 months.  She is currently taking aspirin.  Denies any new falls or trauma.        ROS: Pertinent positives were reviewed as per the HPI above.   All other pertinent systems were reviewed and are negative.      Past Medical History/Problem list:  Past Medical  History:  No date: Anxiety  No date: Arthritis  No date: COPD (chronic obstructive pulmonary disease) (HCC)  No date: Depression  No date: Obese  No date: Stroke [facility])  No date: Substance addiction (HCC)  No date: Varicose veins of lower extremities  Patient Active Problem List:     Opioid dependence on agonist therapy (HCC)     Tobacco use disorder     Fibrocystic breast     Essential hypertension     Knee pain     Chronic low back pain     OA (osteoarthritis) of knee     H. pylori infection     Major depressive disorder, recurrent episode, severe (HCC)     Occult blood in stools     Prediabetes     Epigastric pain     Chronic obstructive pulmonary disease (HCC)     Left foot pain     Multiple polyps of colon     Acquired deformity of toe, right     Dislocation of metatarsophalangeal joint of lesser toe, right, sequela     Postmenopausal atrophic vaginitis     Abnormal loss of weight     Aneurysm of ascending aorta without rupture (HCC)  COVID-19 virus infection     BMI 32.0-32.9,adult     Chronic dysuria     LLQ pain     Osteoporosis     History of endometriosis     Lung cancer screening     Constipation, drug induced     Weakness     Cerebrovascular accident (CVA) (HCC)     Overactive bladder      [facility] [id number] Option #4     Substance addiction (HCC)     Anxiety     Obese     S/P total knee arthroplasty, right     Patellar tendon rupture, right, initial encounter     Status post total right knee replacement     Rupture patellar tendon, right, subsequent encounter     Patellar tendon rupture, right, sequela     S/P revision of total knee, right     Gait instability        Past Surgical History: Past Surgical History:  2023: COLONOSCOPY  No date: ENDOMETRIAL ABLTJ THERMAL W/O HYSTEROSCOPIC GID  No date: PR ANES HRNA REPAIR UPR ABD TABDL RPR DIPHRG HRNA  No date: PR ANES IPER LOWER ABD W/LAPS RAD HYSTERECTOMY  No date: RPR 1ST INGUN HRNA PRETERM INFT  RDC      Medications:  Current Facility-Administered Medications   Medication    morphine injection 4 mg    ondansetron (ZOFRAN) injection 4 mg     Current Outpatient Medications   Medication Sig    oxyCODONE (ROXICODONE) 5 MG immediate release tablet Take 1 tablet by mouth every 8 (eight) hours as needed for Pain Max Daily Amount: 15 mg  for up to 14 days    hydrOXYzine (ATARAX) 25 MG tablet Take 1 tablet by mouth nightly as needed for Itching    celecoxib (CELEBREX) 200 MG capsule Take 1 capsule by mouth daily    acetaminophen (TYLENOL) 500 MG tablet Take 2 tablets by mouth every 8 (eight) hours as needed for Pain    buprenorphine-naloxone (SUBOXONE) 8-2 MG sublingual film Take 1.5 films under the tongue in the morning, 1 film midday, and 1 film at night    estrogen, conjugated, (PREMARIN) 0.625 MG/GM vaginal cream Place 0.5 g vaginally daily    OTHER MEDICATION Bedside commode  For use as directed.    DX: OA knee, gait instability  ICD10:M17.9, R26.81    hydrocortisone 2.5 % cream Apply topically 2 (two) times daily To affected areas of scalp and ears    polyethylene glycol (GLYCOLAX/MIRALAX) 17 g packet Take 1 packet by mouth daily    albuterol HFA 108 (90 Base) MCG/ACT inhaler Inhale 2 puffs into the lungs every 6 (six) hours as needed for Wheezing    docusate sodium (COLACE) 100 MG capsule TAKE 1 CAPSULE BY MOUTH IN THE MORNING AND BEFORE BEDTIME    tiotropium-olodaterol (STIOLTO RESPIMAT) 2.5-2.5 MCG/ACT inhal Inhale 2 puffs into the lungs daily    buPROPion (WELLBUTRIN SR) 200 MG 12 hr tablet TAKE 1 TABLET BY MOUTH IN THE MORNING AND BEFORE BEDTIME    cetirizine (ZYRTEC) 10 MG tablet TAKE 1 TABLET BY MOUTH EVERY DAY IN THE MORNING    VITAMIN D3 50 MCG (2000 UT) TABS tablet TAKE 1 TABLET BY MOUTH EVERY DAY IN THE MORNING    atorvastatin (LIPITOR) 80 MG tablet Take 1 tablet by mouth Daily after dinner (Patient not taking: Reported on 05/27/2023)         Social History: Social History  Tobacco Use      Smoking  status: Former        Packs/day: 0.00        Years: 1 pack/day for 32.0 years (32.0 ttl pk-yrs)        Types: Cigarettes        Start date: 09/26/1989        Quit date: 09/26/2021        Years since quitting: 1.7        Passive exposure: Never      Smokeless tobacco: Never      Tobacco comments: quit 1980-1996, then light until 2003, then 1 ppd since    Alcohol use: No        Allergies:  Review of Patient's Allergies indicates:   Darvon                     Meperidine hcl              Comment:Nausea/vomit   Paxil [paroxetine]      Rash   Zoloft [sertraline *    Rash      Physical Exam:  ED Triage Vitals   ED Triage Vitals Brief Group      Temp 06/25/23 2303 98 F      Pulse 06/25/23 2301 84      Resp 06/25/23 2301 17      BP 06/25/23 2301 166/86      SpO2 06/25/23 2301 97 %      Pain Score 06/25/23 2303 7        GENERAL: No acute distress.   SKIN:  Warm & Dry, no rash.  HEAD: Normocephalic, atraumatic.   NECK:  supple, normal ROM   LUNGS:  Clear to auscultation bilaterally. No wheezes, rales, rhonchi.   CARDIOVASCULAR:  RRR.  WWP throughout  ABDOMEN:  Soft, non-tender.  No guarding or rebound tenderness.   MUSCULOSKELETAL: Long-leg cast on the right lower extremity.  Able to move all toes.  No signs of poor perfusion.  NEUROLOGIC: Alert and oriented.  Cranial Nerves intact. Moves all extremities well.  PSYCHIATRIC:  Appropriate for age, time of day, and situation.        ED Course and Medical Decision-making:    Cheryl Dixon is a  68 year old female with PMH of  has a past medical history of Anxiety, Arthritis, COPD (chronic obstructive pulmonary disease) (HCC), Depression, Obese, Stroke (HCC), Substance addiction (HCC), and Varicose veins of lower extremities.    She has no past medical history of Adverse effect of anesthesia, Malignant hyperthermia, PONV (postoperative nausea and vomiting), or Sleep apnea. presenting with a chief complaint of chest pain, shortness of breath.  Knee pain has not changed for the last 2  months.  Plan to rule out ACS, PE, using D-dimer, troponins.  EKG here is nonischemic.  Will get a chest x-ray.  She is due to get the cast off in her right knee on Monday at this time I do not think we need to reimage her right lower extremity as she has had no new injury.  Given the increased pain, and the fact that we are unable to get an ultrasound of the right lower extremity of the cast, if her D-dimer is extremely high, we may need to consider starting her on prophylactic anticoagulation.    Other differential includes compartment syndrome although have low suspicion given that her pain does not appear to be severe, she is actually ambulatory.  Records from an outside hospital system, primary care office, or prior inpatient notes were reviewed during this encounter: Outpatient orthopedic notes    Social determinants of health affected this patient's illness in care as they experience one or more of the following:  Lack of access to preventative health    CT imaging with fluid collection. Will discuss with orthopedics.  No DVT or PE found.      ED COURSE:  ED Course as of 06/26/23 0525   Thu Jun 25, 2023   2319 Independent Interpretation of EKG: Sinus rhythm, with a rate of 77, normal intervals, no evidence of acute ischemia     2335 Based on her exam I have low suspicion for infectious etiology of her right lower extremity   Fri Jun 26, 2023   0007 Hemoglobin is above baseline.   0201 Independent Interpretation of XRAY: No focal consolidation     0239 CTA chest with no evidence of pulmonary embolus.   1610 I discussed the case with orthopedics on call, discussed findings of CT imaging, they will evaluate patient in the ED first thing in the morning.   0522 Patient will board in the ED until then.   9604 Patient was signed out to oncoming physician with plan for orthopedics consultation for Thedore Mins the morning in the emergency department.  Disposition per orthopedics.         Medications Given:  Medications    morphine injection 4 mg (has no administration in time range)   ondansetron (ZOFRAN) injection 4 mg (has no administration in time range)       Labs:  Labs Reviewed   LAB ADD ON/WRITE IN TEST   BASIC METABOLIC PANEL   CBC, PLATELET & DIFFERENTIAL   TROPONIN T HS BASELINE   HOLD BLUE TOP TUBE       Imaging:  No orders to display                Condition: Stable    Disposition: Signed out to oncoming physician pending orthopedics consultation.    Diagnosis/Diagnoses:  Pain of right lower extremity      This note was created with voice recognition software in the emergency department in real time.  Please excuse any errors which have not been edited out.

## 2023-06-26 ENCOUNTER — Emergency Department (HOSPITAL_BASED_OUTPATIENT_CLINIC_OR_DEPARTMENT_OTHER)

## 2023-06-26 DIAGNOSIS — Z4689 Encounter for fitting and adjustment of other specified devices: Secondary | ICD-10-CM

## 2023-06-26 DIAGNOSIS — M25561 Pain in right knee: Secondary | ICD-10-CM | POA: Diagnosis not present

## 2023-06-26 DIAGNOSIS — Z96651 Presence of right artificial knee joint: Secondary | ICD-10-CM

## 2023-06-26 DIAGNOSIS — S86811D Strain of other muscle(s) and tendon(s) at lower leg level, right leg, subsequent encounter: Secondary | ICD-10-CM

## 2023-06-26 DIAGNOSIS — I288 Other diseases of pulmonary vessels: Secondary | ICD-10-CM

## 2023-06-26 DIAGNOSIS — R7989 Other specified abnormal findings of blood chemistry: Secondary | ICD-10-CM | POA: Diagnosis not present

## 2023-06-26 DIAGNOSIS — I7121 Aneurysm of the ascending aorta, without rupture: Secondary | ICD-10-CM

## 2023-06-26 DIAGNOSIS — M7989 Other specified soft tissue disorders: Secondary | ICD-10-CM | POA: Diagnosis not present

## 2023-06-26 DIAGNOSIS — M79604 Pain in right leg: Secondary | ICD-10-CM

## 2023-06-26 LAB — CBC, PLATELET & DIFFERENTIAL
ABSOLUTE BASO COUNT: 0.1 10*3/uL (ref 0.0–0.1)
ABSOLUTE EOSINOPHIL COUNT: 0 10*3/uL (ref 0.0–0.8)
ABSOLUTE IMM GRAN COUNT: 0.07 10*3/uL (ref 0.00–0.10)
ABSOLUTE LYMPH COUNT: 1.1 10*3/uL (ref 0.6–5.9)
ABSOLUTE MONO COUNT: 0.7 10*3/uL (ref 0.2–1.4)
ABSOLUTE NEUTROPHIL COUNT: 6.6 10*3/uL (ref 1.6–8.3)
ABSOLUTE NRBC COUNT: 0 10*3/uL (ref 0.0–0.0)
BASOPHIL %: 0.6 % (ref 0.0–1.2)
EOSINOPHIL %: 0.5 % (ref 0.0–7.0)
HEMATOCRIT: 35.4 % (ref 34.1–44.9)
HEMOGLOBIN: 10.6 g/dL — ABNORMAL LOW (ref 11.2–15.7)
IMMATURE GRANULOCYTE %: 0.8 % (ref 0.0–1.0)
LYMPHOCYTE %: 12.5 % — ABNORMAL LOW (ref 15.0–54.0)
MEAN CORP HGB CONC: 29.9 g/dL — ABNORMAL LOW (ref 31.0–37.0)
MEAN CORPUSCULAR HGB: 23.6 pg — ABNORMAL LOW (ref 26.0–34.0)
MEAN CORPUSCULAR VOL: 78.7 fl — ABNORMAL LOW (ref 80.0–100.0)
MEAN PLATELET VOLUME: 9 fL (ref 8.7–12.5)
MONOCYTE %: 8.3 % (ref 4.0–13.0)
NEUTROPHIL %: 77.3 % — ABNORMAL HIGH (ref 40.0–75.0)
NRBC %: 0 % (ref 0.0–0.0)
PLATELET COUNT: 330 10*3/uL (ref 150–400)
RBC DISTRIBUTION WIDTH STD DEV: 41.7 fL (ref 35.1–46.3)
RED BLOOD CELL COUNT: 4.5 M/uL (ref 3.90–5.20)
WHITE BLOOD CELL COUNT: 8.5 10*3/uL (ref 4.0–11.0)

## 2023-06-26 LAB — TROPONIN T HS 1 HOUR
DELTA 1 HOUR TROPONIN T HS: 0 ng/L (ref 0–4)
TROPONIN T HS 1 HOUR RESULT: 11 ng/L — ABNORMAL HIGH (ref 0–10)

## 2023-06-26 LAB — URINALYSIS RFLX TO URINE CULT
BILIRUBIN, URINE: NEGATIVE
GLUCOSE, URINE: NEGATIVE mg/dL
KETONE, URINE: NEGATIVE mg/dL
LEUKOCYTES, URINE: NEGATIVE
NITRITE, URINE: NEGATIVE
OCCULT BLOOD, URINE: NEGATIVE
PH, URINE: 6.5 (ref 5.0–8.0)
PROTEIN, URINE: NEGATIVE mg/dL
SPECIFIC GRAVITY, URINE: 1.048 — ABNORMAL HIGH (ref 1.003–1.035)
UROBILINOGEN, URINE: 0.2 U/dL (ref 0.2–1.0)

## 2023-06-26 LAB — D-DIMER PE/DVT, QUANTITATIVE: D-DIMER PE/DVT, QUANTITATIVE: 2348 ng{FEU}/mL (ref 0.00–499)

## 2023-06-26 LAB — HOLD BLUE TOP TUBE

## 2023-06-26 MED ORDER — OXYCODONE HCL 5 MG PO TABS
5.0000 mg | ORAL_TABLET | Freq: Once | ORAL | Status: AC
Start: 2023-06-26 — End: 2023-06-26
  Administered 2023-06-26: 5 mg via ORAL
  Filled 2023-06-26: qty 1

## 2023-06-26 MED ORDER — LORAZEPAM 1 MG PO TABS
1.0000 mg | ORAL_TABLET | Freq: Once | ORAL | Status: AC
Start: 2023-06-26 — End: 2023-06-26
  Administered 2023-06-26: 1 mg via ORAL
  Filled 2023-06-26: qty 1

## 2023-06-26 MED ORDER — LORAZEPAM 0.5 MG PO TABS
0.5000 mg | ORAL_TABLET | Freq: Every day | ORAL | 0 refills | Status: AC | PRN
Start: 2023-06-26 — End: 2023-06-28

## 2023-06-26 MED ORDER — LORAZEPAM 2 MG/ML IJ SOLN
1.0000 mg | Freq: Once | INTRAMUSCULAR | Status: AC
Start: 2023-06-26 — End: 2023-06-26
  Administered 2023-06-26: 1 mg via INTRAVENOUS
  Filled 2023-06-26: qty 1

## 2023-06-26 MED ORDER — NORMAL SALINE FLUSH 0.9 % IV SOLN
10.0000 mL | Freq: Once | INTRAVENOUS | Status: AC
Start: 2023-06-26 — End: 2023-06-26
  Administered 2023-06-26: 100 mL via INTRAVENOUS

## 2023-06-26 MED ORDER — IOHEXOL 350 MG/ML IV SOLN
45.0000 mL | Freq: Once | INTRAVENOUS | Status: AC
Start: 2023-06-26 — End: 2023-06-26
  Administered 2023-06-26: 145 mL via INTRAVENOUS

## 2023-06-26 NOTE — Narrator Note (Signed)
 Patient Disposition  Patient education for diagnosis, medications, activity, diet and follow-up.  Patient left ED 8:45 AM.  Patient rep received written instructions.    Interpreter to provide instructions: No    Patient belongings with patient: YES    Have all existing LDAs been addressed? N/A    Have all IV infusions been stopped? N/A    Destination: Discharged to home instructions reviewed.

## 2023-06-26 NOTE — Narrator Note (Signed)
 Ortho to consult patient in the AM

## 2023-06-26 NOTE — Consults (Signed)
 ORTHOPEDIC SURGERY CONSULT NOTE       Reason for Consult: right leg discomfort in cast after orthopedic procedure     Consult Requested by: ED    HPI:  This is a 68 year old English speaking female who presented to [facility] ED with chest pain and right leg discomfort.     This patient is well know to orthopedics. She had a right TKA on 03/11/23 with Dr. Sallee Lange c/b mechanical fall and open patella tendon rupture. Then brought back to the OR on 03/29/23 for poly exchange, irrigation/wound debridement, patellar tendon repair with cerclage cable with Dr. Bo Merino. Follow up showed failed patellar tendon repair with patella alta and displacement of the cable. Infectious work up negative. She then underwent a revision right TKA to constrained prosthesis, extensor mechanism reconstruction, patellectomy and ROH on 05/19/23 with Dr. Lorel Monaco. Placed into a long leg cast for knee immobilization. She is 5.5 weeks out from this last surgery.      Unfortunately, she has never found the cast to be very comfortable despite changing it out. She did call our office on Tuesday as well regarding this. States that on Monday she stopped taking her pain medications and had been eating a lot of salty foods. The next day her bilateral legs and hands began to swell. She had to take her ring off. Feels that the cast is heavy and feels extra pressure in the shin area when standing. Pressure and discomfort alleviated when laying down but still anxious at nighttime. Last night she reports feeling panicked about her leg in the cast and began with some chest pain. She was BIB ambulance for evaluation. Cardiac workup all negative. CTA r/o PE. CT right LE showed no evidence of DVT. Did show a small fluid collection likely representing post op residual seroma.     This morning, patient is found sleeping comfortable in the ED stretcher. She is back on her pain medications. She is elevating her foot on pillows. She is feeling a bit better. No more chest  pain and no large pain in the leg. Some pressure but she is "used to it". Denies extreme pain. Can move the leg off the bed freely and wiggle the toes with no increased pain. States the swelling in her left leg and hands have resolved. She is taking aspirin BID still.      Review of systems was performed by ED attending and was reviewed by me in epic. Pertinent positives listed in HPI.     Past Medical History:   Past Medical History:  No date: Anxiety  No date: Arthritis  No date: COPD (chronic obstructive pulmonary disease) (HCC)  No date: Depression  No date: Obese  No date: Stroke [facility])  No date: Substance addiction (HCC)  No date: Varicose veins of lower extremities    Past SurgicalHistory:   Past Surgical History:  2023: COLONOSCOPY  No date: ENDOMETRIAL ABLTJ THERMAL W/O HYSTEROSCOPIC GID  No date: PR ANES HRNA REPAIR UPR ABD TABDL RPR DIPHRG HRNA  No date: PR ANES IPER LOWER ABD W/LAPS RAD HYSTERECTOMY  No date: RPR 1ST INGUN HRNA PRETERM INFT RDC    Medications prior to Admission:       Allergies:   Review of Patient's Allergies indicates:   Darvon                     Meperidine hcl              Comment:Nausea/vomit  Paxil [paroxetine]      Rash   Zoloft [sertraline *    Rash    TobaccoUse:   Social History    Tobacco Use      Smoking status: Former        Packs/day: 0.00        Years: 1 pack/day for 32.0 years (32.0 ttl pk-yrs)        Types: Cigarettes        Start date: 09/26/1989        Quit date: 09/26/2021        Years since quitting: 1.7        Passive exposure: Never      Smokeless tobacco: Never      Tobacco comments: quit 1980-1996, then light until 2003, then 1 ppd since      Alcohol:     Alcohol use   No       Family History:   Review of patient's family history indicates:  Problem: Heart      Relation: Father          Age of Onset: (Not Specified)          Comment: CAD, died age 39  Problem: Pulmonary      Relation: Father          Age of Onset: (Not Specified)          Comment: COPD  Problem:  Alcohol/Drug Abuse      Relation: Father          Age of Onset: (Not Specified)          Comment: alcohol use disorder  Problem: Mental/Emotional Disorders      Relation: Father          Age of Onset: (Not Specified)          Comment: PTSD  Problem: Heart      Relation: Mother          Age of Onset: (Not Specified)          Comment: CAD  Problem: Heart      Relation: Brother          Age of Onset: (Not Specified)          Comment: CAD, died age 74  Problem: Cancer - Breast      Relation: Maternal Aunt          Age of Onset: (Not Specified)          Comment: age 28  Problem: Cancer - Breast      Relation: Maternal Aunt          Age of Onset: (Not Specified)          Comment: age 67      Physical Exam:   Vital Signs: BP 129/88   Pulse 68   Temp 98 F (36.7 C)   Resp (!) 21   LMP 11/20/1991   SpO2 93%   Gen: Alert and oriented x 3. No acute distress.  HEENT: Atraumatic, normocephalic. Conjunctivae clear. Mucous membranes moist.  Neck: Supple. No JVD  CV: Regular rate and rhythm.  Pulm: Non-labored breathing  Abd: Soft, nontender/nondistended.  Skin: Without rashes or lesions.  Msk: Evaluation of the right lower extremity reveals no skin problems from the cast. Cast heel is cracked and worn but still attached and covering the bottom on her foot. Skin is warm, dry, intact. Her right thigh is soft and compressible. When laying down there is plenty of  negative space between her thigh and cast proximally. Her right distal foot and toes have minimal edema. Wiggles toes freely without pain. No pain with passive stretching of the toes. I can fit my fingers easily in between for dorsal foot and cast when laying down.  NTTP throughout. No signs of infection. NVI distally.   Neuro: Normal mentation and speech.    Imaging: CT right lower extremity: Mildly rim-enhancing multiloculated fluid collection measuring roughly 1.3 x 1.5 x 0.8 cm along the anterior right thigh subfascial tissue, likely a residual postoperative seroma.  No gas in the soft tissues. Skin defect likely representing the surgical site. Soft tissue swelling of the musculature of the mid and lower right thigh. No DVT. Revision TKA without complications.   These images were reviewed and interpreted by myself and Dr. Lorel Monaco.    Impression & Recommendations: This is an 68 year old English speaking female s/p right TKA on 03/11/2023 with Dr. Sallee Lange, c/b open right patella tendon rupture (grade 3), s/p poly exchange, irrigation/wound debridement, patellar tendon repair with cerclage cable on 03/29/2023 with Dr. Bo Merino, failed patellar tendon repair with s/p revision right TKA to constrained prosthesis, extensor mechanism reconstruction, patellectomy and ROH on 05/19/23 with Dr. Lorel Monaco. Now 5.5 weeks post last surgery, in her long leg cast with discomfort.     Exam was reassuring today. There is plenty of space between her cast and her leg at both the distal and proximal ends. She tells me that today laying down is fine. The pain is mostly when she stands up. I explained that elevation as much as possible will be best for her. She doesn't seem too swollen today but she can monitor and she and her PCP can decide if a diuretic makes sense for her if she becomes bilaterally swollen again. Sounds like the pain also increased with stopping her pain medications a couple days ago. She has restarted this. I did review the CT remotely with Dr. Lorel Monaco. No evidence of DVT. He was not concerned with the small fluid collection. Given that she is afebrile without leukocytosis, we are reassured against infection. Her exam is not consistent with compartment syndrome. Her planned post op clinic visit is Monday. Then, we can be better prepared to do a complete cast exchange with the surgeon at 6 weeks postop. Patient is agreeable to this plan.     The patient was seen independently and my findings were discussed with Dr. Lorel Monaco. Please refer to ED attending note for more details.    I  spent a total of 60 minutes on this visit on the date of service (total time includes all activities performed on the date of service).     I reviewed the likely diagnosis, the prognosis, and various treatment options in detail. Risks and benefits of treatment plan discussed. The patient's questions have been answered, and the patient understands agrees with treatment plan.     Darra Lis, PA-C, 06/26/2023    Pager [phone]

## 2023-06-26 NOTE — ED Notes (Signed)
 This patient was signed out to me at change of shift by my colleague.    Please see ED course below for updates on clinical course.     ED Course as of 06/26/23 0826   Fri Jun 26, 2023   0658 6:58 AM  This is a 68 year old female with a PMHx of recent R knee arthroplasty who presents with chest pain and RLE pain.  Negative PE study  CTV negative RLE - but with fluid collection  [ ]  ortho recs      [phone] Spoke with [place]  Evaluated patient at bedside, reviewed images with her attending  Safe for DC at this time - will wait until Monday to have cast removed    0821 Patient requesting Ativan to sleep.  Has tried hydroxyzine in the past, does not help.  Did tolerate this medication overnight.  Only requesting it to help her "get through the weekend", she has difficulty sleeping in the cast.  Discussed with her that she cannot mix this with other sedating medicines as oxycodone, she agrees with this.  Will send 2 tablets to her pharmacy.       No acute events this shift.       Diagnosis:  Pain of right lower extremity  Chest pain, unspecified type  Disposition:  Discharged to home in stable condition      Drema Balzarine, MD

## 2023-06-26 NOTE — Narrator Note (Signed)
Patient positioned for comfort. Call light within reach.

## 2023-06-26 NOTE — Discharge Instructions (Signed)
 You are seen in the emergency department with leg pain and chest pain.    You had blood test, and CT scans that did not show concerning findings.    You were seen by the orthopedic team, and you are being discharged with instructions to follow-up at their office on Monday to have the cast removed.  Please continue your home medications as prescribed.    You may take the Ativan as needed for anxiety, at night to help you sleep.  Do not take this with other sedating medications such as oxycodone, or alcohol.    Return to the emergency room with any new or concerning symptoms.

## 2023-06-28 LAB — EKG

## 2023-06-29 ENCOUNTER — Ambulatory Visit (HOSPITAL_BASED_OUTPATIENT_CLINIC_OR_DEPARTMENT_OTHER): Payer: No Typology Code available for payment source | Admitting: Orthopaedic Surgery

## 2023-06-29 ENCOUNTER — Ambulatory Visit
Admission: RE | Admit: 2023-06-29 | Discharge: 2023-06-29 | Disposition: A | Discharge status: Home / Self Care | Attending: Diagnostic Radiology | Admitting: Diagnostic Radiology

## 2023-06-29 ENCOUNTER — Other Ambulatory Visit: Payer: Self-pay

## 2023-06-29 ENCOUNTER — Other Ambulatory Visit (INDEPENDENT_AMBULATORY_CARE_PROVIDER_SITE_OTHER)

## 2023-06-29 ENCOUNTER — Ambulatory Visit (INDEPENDENT_AMBULATORY_CARE_PROVIDER_SITE_OTHER): Payer: 59 | Admitting: Orthopedic Surgery

## 2023-06-29 DIAGNOSIS — M25561 Pain in right knee: Secondary | ICD-10-CM

## 2023-06-29 DIAGNOSIS — Z96651 Presence of right artificial knee joint: Secondary | ICD-10-CM

## 2023-06-29 DIAGNOSIS — S86811D Strain of other muscle(s) and tendon(s) at lower leg level, right leg, subsequent encounter: Secondary | ICD-10-CM

## 2023-06-29 DIAGNOSIS — Z471 Aftercare following joint replacement surgery: Secondary | ICD-10-CM | POA: Insufficient documentation

## 2023-06-29 DIAGNOSIS — M67441 Ganglion, right hand: Secondary | ICD-10-CM

## 2023-06-29 DIAGNOSIS — R739 Hyperglycemia, unspecified: Secondary | ICD-10-CM | POA: Diagnosis not present

## 2023-06-29 NOTE — Progress Notes (Unsigned)
 Michelle Roberts - 68 y.o. female MRN [id number]  Date of birth: [birth year]  Office Visit Note: Visit Date: 06/29/2023 PCP: Tally Joe, MD Referred by: Tally Joe, MD  Subjective: No chief complaint on file.  HPI: Michelle Roberts is a pleasant 68 y.o. female who presents today for evaluation of a right long finger mass at the ulnar dorsal aspect of the DIP.  She states that she has had periodic drainage from this region with clear fluid and associated pain.  She states that the pain has increased in nature more recently, and she is interested in intervention.  She has maintained relatively good motion at the DIP of the long finger.  She is diabetic, A1c obtained today is 6.7.  Pertinent ROS were reviewed with the patient and found to be negative unless otherwise specified above in HPI.   Visit Reason: right long finger mucous cyst Duration of symptoms: 6+ months Hand dominance: right Occupation: retired Diabetic: Yes / 6.7 Smoking: No Heart/Lung History:none Blood Thinners: none  Prior Testing/EMG: none Injections (Date): none Treatments:none Prior Surgery: none  Assessment & Plan: Visit Diagnoses:  1. Digital mucinous cyst of finger of right hand     Plan: Extensive discussion was had with the patient today regarding her ongoing digital mucous cyst of the right long finger.  We discussed the underlying pathophysiology and reviewed her x-rays in detail today which show significant arthritis at the DIP region of the long finger.  We discussed treat modalities ranging from conservative to surgical.  From a conservative standpoint, we discussed observation, Coban wrapping, splinting, activity modification and anti-inflammatory medication as needed.  Given her ongoing symptoms, she is interested more aggressive measures.  From a surgical standpoint, we discussed cyst excision and DIP joint debridement as well as the risk and benefits associated, particularly with regards  to potential recurrence.  We also discussed the underlying arthritic nature of the joint and the potential need for DIP fusion in the future.  At this juncture, she would like to avoid fusion procedure if possible and maintain her motion at the DIP.  Instead, she would like to move forward with right long finger mucous cyst excision and DIP joint debridement.  Surgery will be performed under IV sedation per patient request.  Risks and benefits of the procedure were discussed, risks including but not limited to infection, bleeding, scarring, stiffness, nerve injury, tendon injury, vascular injury, recurrence of symptoms, ongoing arthritic pain and need for subsequent operation.  Patient expressed understanding.  We will move forward with surgical scheduling.    Follow-up: No follow-ups on file.   Meds & Orders: No orders of the defined types were placed in this encounter.   Orders Placed This Encounter  Procedures   XR Hand Complete Right     Procedures: No procedures performed      Clinical History: MRI LUMBAR SPINE WITHOUT CONTRAST   TECHNIQUE: Multiplanar, multisequence MR imaging of the lumbar spine was performed. No intravenous contrast was administered.   COMPARISON:  X-ray lumbar 02/12/2021.   FINDINGS: Segmentation:  Standard.   Alignment: 3 mm retrolisthesis of L1 on L2. Grade 1 anterolisthesis of L5 on S1 secondary to facet disease.   Vertebrae: No acute fracture, evidence of discitis, or aggressive bone lesion.   Conus medullaris and cauda equina: Conus extends to the L3-4 level. Conus and cauda equina appear normal.   Paraspinal and other soft tissues: No acute paraspinal abnormality.   Disc levels:  Disc spaces: Degenerative disease with disc height loss at L1-2. Disc desiccation at L3-4, L4-5 and L5-S1.   T12-L1: No significant disc bulge. No neural foraminal stenosis. No central canal stenosis.   L1-L2: Broad-based disc osteophyte complex with a  prominent right lateral component. Mild right foraminal stenosis. No left foraminal stenosis. Mild bilateral facet arthropathy. Mild spinal stenosis.   L2-L3: No significant disc bulge. No neural foraminal stenosis. No central canal stenosis.   L3-L4: Mild broad-based disc bulge. Moderate bilateral facet arthropathy with bilateral facet effusions. Moderate spinal stenosis. Severe left foraminal stenosis. Moderate right foraminal stenosis.   L4-L5: Broad-based disc bulge. Moderate bilateral facet arthropathy. Moderate spinal stenosis. Moderate bilateral foraminal stenosis.   L5-S1: Mild broad-based disc bulge. Severe bilateral facet arthropathy. Mild left foraminal stenosis. No right foraminal stenosis. No spinal stenosis   IMPRESSION: 1. Lumbar spine spondylosis as described above. 2. No acute osseous injury of the lumbar spine.     Electronically Signed   By: Elige Ko M.D.   On: 03/04/2021 11:41  She reports that she has never smoked. She has never used smokeless tobacco. No results for input(s): "HGBA1C", "LABURIC" in the last 8760 hours.  Objective:   Vital Signs: There were no vitals taken for this visit.  Physical Exam  Gen: Well-appearing, in no acute distress; non-toxic CV: Regular Rate. Well-perfused. Warm.  Resp: Breathing unlabored on room air; no wheezing. Psych: Fluid speech in conversation; appropriate affect; normal thought process  Ortho Exam Right long finger: - Dorsal ulnar aspect of the DIP with 1.5 cm x 1.5 cm cystic mass, tender to palpation - DIP range of motion 0-40, no gross instability or deviation - Color and capillary refill distally appropriate, sensation intact distally  Imaging: X-ray of the right hand, multiple views were performed today X-rays demonstrate significant diffuse small joint arthritis of the DIP joints, notable joint space narrowing with osteophyte formation.  Focus on the long finger today based on clinical examination,  correlates with significant joint space narrowing at the DIP with notable osteophyte formation.  Past Medical/Family/Surgical/Social History: Medications & Allergies reviewed per EMR, new medications updated. 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   Fibromyalgia 08/12/2016   High risk medication use 08/12/2016   Primary osteoarthritis of both hands 08/12/2016   Acute midline low back pain 08/12/2016   DDD (degenerative disc disease), lumbar 08/12/2016   History of hypertension 08/12/2016   History of high cholesterol 08/12/2016   Thyroid ca (HCC) 08/12/2016   History of hypothyroidism 08/12/2016   History of gastroesophageal reflux (GERD) 08/12/2016   History of TMJ syndrome 08/12/2016   Burning tongue syndrome 08/12/2016   History of vitamin D deficiency 08/12/2016   Other sleep apnea 08/12/2016   Vitamin D deficiency 08/12/2016   Rheumatoid arthritis with rheumatoid factor of multiple sites without organ or systems involvement (HCC) 12/31/2015   Goals of care, counseling/discussion 06/02/2013   Myofascial muscle pain 06/02/2013   Narcotic-induced mood disorder (HCC) 06/02/2013   Inflammatory arthritis 04/08/2011   Plantar fasciitis 04/08/2011   Yeast infection 04/08/2011   Past Medical History:  Diagnosis Date   Anemia    Anxiety    Arthritis    Asthma    Back pain    Cancer (HCC)    Depression    Fibromyalgia    Gallbladder problem    GERD (gastroesophageal reflux  disease)    Hyperlipidemia    Hypertension    Hypothyroidism    thyroidectomy   Joint pain    Morton neuroma, right    per patient    Neoplasm of parotid gland    Neuromuscular disorder (HCC)    Osteoarthritis    Palpitation    Prediabetes    Rheumatoid aortitis    Sleep apnea    uses CPAP sometimes   Swelling of lower extremity    Vitamin D deficiency    Family History   Problem Relation Age of Onset   Alzheimer's disease Mother    Hypertension Mother    Hyperlipidemia Mother    Alzheimer's disease Sister    Diabetes Brother    High Cholesterol Brother    Hypertension Brother    Diabetes Sister    Hypertension Sister    High Cholesterol Sister    Diabetes Sister    Hypertension Sister    High Cholesterol Sister    Diabetes Brother    Hypertension Brother    High Cholesterol Brother    Healthy Daughter    Healthy Daughter    Past Surgical History:  Procedure Laterality Date   ABDOMINAL HYSTERECTOMY     CHOLECYSTECTOMY     HAMMER TOE SURGERY     MASS EXCISION N/A 11/17/2017   Procedure: EXCISION SOFT PALATE LESION;  Surgeon: Drema Halon, MD;  Location: [facility];  Service: ENT;  Laterality: N/A;   PAROTIDECTOMY Left 04/08/2016   Procedure: LEFT SUPERFICIAL PAROTIDECTOMY WITH FACIAL NERVE DISECTION;  Surgeon: Drema Halon, MD;  Location: [facility];  Service: ENT;  Laterality: Left;   TARSAL METATARSAL ARTHRODESIS Right 02/03/2018   Procedure: RIGHT GREAT TOE  FUSION;  Surgeon: Tarry Kos, MD;  Location: [facility];  Service: Orthopedics;  Laterality: Right;   THYROIDECTOMY     Social History   Occupational History   Not on file  Tobacco Use   Smoking status: Never   Smokeless tobacco: Never  Vaping Use   Vaping status: Never Used  Substance and Sexual Activity   Alcohol use: No   Drug use: No   Sexual activity: Not on file    Cadin Luka Trevor Mace, M.D. [facility], Hand Surgery

## 2023-06-29 NOTE — H&P (View-Only) (Signed)
 Michelle Roberts - 68 y.o. female MRN [id number]  Date of birth: 09-Mar-1956  Office Visit Note: Visit Date: 06/29/2023 PCP: Tally Joe, MD Referred by: Tally Joe, MD  Subjective: No chief complaint on file.  HPI: Michelle Roberts is a pleasant 68 y.o. female who presents today for evaluation of a right long finger mass at the ulnar dorsal aspect of the DIP.  She states that she has had periodic drainage from this region with clear fluid and associated pain.  She states that the pain has increased in nature more recently, and she is interested in intervention.  She has maintained relatively good motion at the DIP of the long finger.  She is diabetic, A1c obtained today is 6.7.  Pertinent ROS were reviewed with the patient and found to be negative unless otherwise specified above in HPI.   Visit Reason: right long finger mucous cyst Duration of symptoms: 6+ months Hand dominance: right Occupation: retired Diabetic: Yes / 6.7 Smoking: No Heart/Lung History:none Blood Thinners: none  Prior Testing/EMG: none Injections (Date): none Treatments:none Prior Surgery: none  Assessment & Plan: Visit Diagnoses:  1. Digital mucinous cyst of finger of right hand     Plan: Extensive discussion was had with the patient today regarding her ongoing digital mucous cyst of the right long finger.  We discussed the underlying pathophysiology and reviewed her x-rays in detail today which show significant arthritis at the DIP region of the long finger.  We discussed treat modalities ranging from conservative to surgical.  From a conservative standpoint, we discussed observation, Coban wrapping, splinting, activity modification and anti-inflammatory medication as needed.  Given her ongoing symptoms, she is interested more aggressive measures.  From a surgical standpoint, we discussed cyst excision and DIP joint debridement as well as the risk and benefits associated, particularly with regards  to potential recurrence.  We also discussed the underlying arthritic nature of the joint and the potential need for DIP fusion in the future.  At this juncture, she would like to avoid fusion procedure if possible and maintain her motion at the DIP.  Instead, she would like to move forward with right long finger mucous cyst excision and DIP joint debridement.  Surgery will be performed under IV sedation per patient request.  Risks and benefits of the procedure were discussed, risks including but not limited to infection, bleeding, scarring, stiffness, nerve injury, tendon injury, vascular injury, recurrence of symptoms, ongoing arthritic pain and need for subsequent operation.  Patient expressed understanding.  We will move forward with surgical scheduling.    Follow-up: No follow-ups on file.   Meds & Orders: No orders of the defined types were placed in this encounter.   Orders Placed This Encounter  Procedures   XR Hand Complete Right     Procedures: No procedures performed      Clinical History: MRI LUMBAR SPINE WITHOUT CONTRAST   TECHNIQUE: Multiplanar, multisequence MR imaging of the lumbar spine was performed. No intravenous contrast was administered.   COMPARISON:  X-ray lumbar 02/12/2021.   FINDINGS: Segmentation:  Standard.   Alignment: 3 mm retrolisthesis of L1 on L2. Grade 1 anterolisthesis of L5 on S1 secondary to facet disease.   Vertebrae: No acute fracture, evidence of discitis, or aggressive bone lesion.   Conus medullaris and cauda equina: Conus extends to the L3-4 level. Conus and cauda equina appear normal.   Paraspinal and other soft tissues: No acute paraspinal abnormality.   Disc levels:  Disc spaces: Degenerative disease with disc height loss at L1-2. Disc desiccation at L3-4, L4-5 and L5-S1.   T12-L1: No significant disc bulge. No neural foraminal stenosis. No central canal stenosis.   L1-L2: Broad-based disc osteophyte complex with a  prominent right lateral component. Mild right foraminal stenosis. No left foraminal stenosis. Mild bilateral facet arthropathy. Mild spinal stenosis.   L2-L3: No significant disc bulge. No neural foraminal stenosis. No central canal stenosis.   L3-L4: Mild broad-based disc bulge. Moderate bilateral facet arthropathy with bilateral facet effusions. Moderate spinal stenosis. Severe left foraminal stenosis. Moderate right foraminal stenosis.   L4-L5: Broad-based disc bulge. Moderate bilateral facet arthropathy. Moderate spinal stenosis. Moderate bilateral foraminal stenosis.   L5-S1: Mild broad-based disc bulge. Severe bilateral facet arthropathy. Mild left foraminal stenosis. No right foraminal stenosis. No spinal stenosis   IMPRESSION: 1. Lumbar spine spondylosis as described above. 2. No acute osseous injury of the lumbar spine.     Electronically Signed   By: Elige Ko M.D.   On: 03/04/2021 11:41  She reports that she has never smoked. She has never used smokeless tobacco. No results for input(s): "HGBA1C", "LABURIC" in the last 8760 hours.  Objective:   Vital Signs: There were no vitals taken for this visit.  Physical Exam  Gen: Well-appearing, in no acute distress; non-toxic CV: Regular Rate. Well-perfused. Warm.  Resp: Breathing unlabored on room air; no wheezing. Psych: Fluid speech in conversation; appropriate affect; normal thought process  Ortho Exam Right long finger: - Dorsal ulnar aspect of the DIP with 1.5 cm x 1.5 cm cystic mass, tender to palpation - DIP range of motion 0-40, no gross instability or deviation - Color and capillary refill distally appropriate, sensation intact distally  Imaging: X-ray of the right hand, multiple views were performed today X-rays demonstrate significant diffuse small joint arthritis of the DIP joints, notable joint space narrowing with osteophyte formation.  Focus on the long finger today based on clinical examination,  correlates with significant joint space narrowing at the DIP with notable osteophyte formation.  Past Medical/Family/Surgical/Social History: Medications & Allergies reviewed per EMR, new medications updated. 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   Fibromyalgia 08/12/2016   High risk medication use 08/12/2016   Primary osteoarthritis of both hands 08/12/2016   Acute midline low back pain 08/12/2016   DDD (degenerative disc disease), lumbar 08/12/2016   History of hypertension 08/12/2016   History of high cholesterol 08/12/2016   Thyroid ca (HCC) 08/12/2016   History of hypothyroidism 08/12/2016   History of gastroesophageal reflux (GERD) 08/12/2016   History of TMJ syndrome 08/12/2016   Burning tongue syndrome 08/12/2016   History of vitamin D deficiency 08/12/2016   Other sleep apnea 08/12/2016   Vitamin D deficiency 08/12/2016   Rheumatoid arthritis with rheumatoid factor of multiple sites without organ or systems involvement (HCC) 12/31/2015   Goals of care, counseling/discussion 06/02/2013   Myofascial muscle pain 06/02/2013   Narcotic-induced mood disorder (HCC) 06/02/2013   Inflammatory arthritis 04/08/2011   Plantar fasciitis 04/08/2011   Yeast infection 04/08/2011   Past Medical History:  Diagnosis Date   Anemia    Anxiety    Arthritis    Asthma    Back pain    Cancer (HCC)    Depression    Fibromyalgia    Gallbladder problem    GERD (gastroesophageal reflux  disease)    Hyperlipidemia    Hypertension    Hypothyroidism    thyroidectomy   Joint pain    Morton neuroma, right    per patient    Neoplasm of parotid gland    Neuromuscular disorder (HCC)    Osteoarthritis    Palpitation    Prediabetes    Rheumatoid aortitis    Sleep apnea    uses CPAP sometimes   Swelling of lower extremity    Vitamin D deficiency    Family History   Problem Relation Age of Onset   Alzheimer's disease Mother    Hypertension Mother    Hyperlipidemia Mother    Alzheimer's disease Sister    Diabetes Brother    High Cholesterol Brother    Hypertension Brother    Diabetes Sister    Hypertension Sister    High Cholesterol Sister    Diabetes Sister    Hypertension Sister    High Cholesterol Sister    Diabetes Brother    Hypertension Brother    High Cholesterol Brother    Healthy Daughter    Healthy Daughter    Past Surgical History:  Procedure Laterality Date   ABDOMINAL HYSTERECTOMY     CHOLECYSTECTOMY     HAMMER TOE SURGERY     MASS EXCISION N/A 11/17/2017   Procedure: EXCISION SOFT PALATE LESION;  Surgeon: Drema Halon, MD;  Location: [facility];  Service: ENT;  Laterality: N/A;   PAROTIDECTOMY Left 04/08/2016   Procedure: LEFT SUPERFICIAL PAROTIDECTOMY WITH FACIAL NERVE DISECTION;  Surgeon: Drema Halon, MD;  Location: [facility];  Service: ENT;  Laterality: Left;   TARSAL METATARSAL ARTHRODESIS Right 02/03/2018   Procedure: RIGHT GREAT TOE  FUSION;  Surgeon: Tarry Kos, MD;  Location:  SURGERY CENTER;  Service: Orthopedics;  Laterality: Right;   THYROIDECTOMY     Social History   Occupational History   Not on file  Tobacco Use   Smoking status: Never   Smokeless tobacco: Never  Vaping Use   Vaping status: Never Used  Substance and Sexual Activity   Alcohol use: No   Drug use: No   Sexual activity: Not on file    Lillyth Spong Trevor Mace, M.D. [facility], Hand Surgery

## 2023-06-29 NOTE — Progress Notes (Signed)
 Long leg cast applied by me under the direction of Dr Carolynne Edouard.    C/S/M normal after cast applied.    Cast/splint care Instructions given to and reviewed with patient.  Patient informed to call office if any pain or swelling occur.    Geryl Rankins, 06/29/2023

## 2023-06-30 ENCOUNTER — Ambulatory Visit: Payer: No Typology Code available for payment source | Admitting: Family Medicine

## 2023-06-30 ENCOUNTER — Telehealth (HOSPITAL_BASED_OUTPATIENT_CLINIC_OR_DEPARTMENT_OTHER): Payer: Self-pay

## 2023-06-30 ENCOUNTER — Ambulatory Visit (HOSPITAL_BASED_OUTPATIENT_CLINIC_OR_DEPARTMENT_OTHER): Payer: Self-pay | Admitting: Registered Nurse

## 2023-06-30 LAB — HEMOGLOBIN A1C
Hgb A1c MFr Bld: 6.7 %{Hb} — ABNORMAL HIGH (ref <5.7)
Mean Plasma Glucose: 146 mg/dL
eAG (mmol/L): 8.1 mmol/L

## 2023-06-30 MED ORDER — CLONAZEPAM 0.5 MG PO TABS
0.5000 mg | ORAL_TABLET | Freq: Two times a day (BID) | ORAL | 1 refills | Status: DC | PRN
Start: 2023-06-30 — End: 2023-07-17

## 2023-06-30 NOTE — Telephone Encounter (Signed)
 I called patient.  Main concern is panic attacks.  Took buspar for 3 days and "it didn't work"  Hydroxyzine caused nausea, dizziness, shakiness.  Able to walk about 1 block.  Mostly anxiety and difficulty sleeping.  Longstanding BDZ use  Took them for 30 years and was off them for a year.  - plan clonazepam bid x 2 weeks until she gets cast off  The patient is educated regarding the risks and benefits of the medications, including habit-forming potential, risk of sedation, falls, and dependence.

## 2023-06-30 NOTE — Telephone Encounter (Signed)
 Answer Assessment - Initial Assessment Questions  Elouise Munroe, RN, 06/30/2023  TC from pt to NAL  RN confirmed pt's name and DOB  Pt requesting "something to help me sleep"  Reports increase anxiety since had her knee replacement and revision surgeries  In a new cast now  Not able to sleep  Typically sleeps 2-3 hours per night if that, no sleep last night  Tearful on phone  "I am so anxious I am crawling out of my own skin!"  Taking Wellbutrin and recently added Buspar  As well as Hydroxizine  "Nothing is helping me. I need some Ativan or something!"  Previously seen by team PA-C, who canceled f/u appt for tom  R/s with Jonnie Finner, PA-C tom am  Asking RN to d/w PCP/team if can have a script for something to sleep tonight  RN to d/w team and f/u with pt    Protocols used: Anxiety and Panic Attack-A-OH

## 2023-06-30 NOTE — Telephone Encounter (Addendum)
 Returned call to daughter, she reports patient has been having increased anxiety since her knee surgery in January. Saw Arleigh 2/21, went to ED 2/27 (Bp was elevated-she was given lorazepam-2 tabs).  Reports she is overwhelmed with the cast and being confined, during the day she is okay because they take turns being with her but at night has increasing anxiety and cannot sleep.  Unclear if taking hydroxyzine.  Advised appt to discuss this, she agreed, scheduled tomorrow.    ----- Message from [place] sent at 06/30/2023  8:33 AM EST -----  Regarding: post-op med list  Cheryl Dixon [id number], 68 year old, female    Calls today: Wants to discuss possible negative med interactions causing anxiety and discomfort.     Person calling on behalf of patient: Daughter    [name] NUMBER: [phone]    Patient's language of care: English    Patient does not need an interpreter.    Patient's PCP: Hildred Laser, MD    Primary Care Home Site:  [facility]

## 2023-07-01 ENCOUNTER — Ambulatory Visit (HOSPITAL_BASED_OUTPATIENT_CLINIC_OR_DEPARTMENT_OTHER): Admitting: Physician Assistant

## 2023-07-01 ENCOUNTER — Ambulatory Visit (HOSPITAL_BASED_OUTPATIENT_CLINIC_OR_DEPARTMENT_OTHER): Admitting: Student in an Organized Health Care Education/Training Program

## 2023-07-03 ENCOUNTER — Other Ambulatory Visit: Payer: Self-pay

## 2023-07-03 ENCOUNTER — Encounter (HOSPITAL_BASED_OUTPATIENT_CLINIC_OR_DEPARTMENT_OTHER): Payer: Self-pay | Admitting: Orthopedic Surgery

## 2023-07-06 ENCOUNTER — Encounter (HOSPITAL_BASED_OUTPATIENT_CLINIC_OR_DEPARTMENT_OTHER)
Admission: RE | Admit: 2023-07-06 | Discharge: 2023-07-06 | Disposition: A | Discharge status: Home / Self Care | Source: Ambulatory Visit | Attending: Orthopedic Surgery | Admitting: Orthopedic Surgery

## 2023-07-06 DIAGNOSIS — Z01812 Encounter for preprocedural laboratory examination: Secondary | ICD-10-CM | POA: Diagnosis present

## 2023-07-06 LAB — BASIC METABOLIC PANEL
Anion gap: 13 (ref 5–15)
BUN: 16 mg/dL (ref 8–23)
CO2: 24 mmol/L (ref 22–32)
Calcium: 8.9 mg/dL (ref 8.9–10.3)
Chloride: 102 mmol/L (ref 98–111)
Creatinine, Ser: 0.87 mg/dL (ref 0.44–1.00)
GFR, Estimated: >60 mL/min (ref >60)
Glucose, Bld: 110 mg/dL — ABNORMAL HIGH (ref 70–99)
Potassium: 4.3 mmol/L (ref 3.5–5.1)
Sodium: 139 mmol/L (ref 135–145)

## 2023-07-06 NOTE — Progress Notes (Signed)
 Orthopedic Office Note    CC: No chief complaint on file.      ORTHOPEDIC PROBLEM LIST:  1. S/p right TKA, DOS: 03/11/2023, [place]  2. Open right patella tendon rupture (grade 3), DOI: 03/26/2023, s/p poly exchange, irrigation/wound debridement, patellar tendon repair with cerclage cable, DOS: 03/29/2023, Lennie Muckle  3. S/p revision right TKA to constrained prosthesis, extensor mechanism reconstruction, patellectomy and ROH, DOS: 05/19/23, Lorel Monaco    HPI: Cheryl Dixon is a 68 year old female who presents today for postop evaluation for her right knee. She is 6 weeks post op.     To review: Patient has had a complicated postop course regarding her right knee. She was initially a patient of Dr. Baldwin [place] who underwent a primary total knee replacement without complication on 03/11/2023. Unfortunately, she sustained a mechanical fall on 03/26/2023; she was sitting in a chair that was lower than she had expected; felt a slight popping sensation and immediately had pain, bleeding. She was found to have a 2.5 cm inferior wound dehiscence as well as evidence for a patellar tendon rupture. She was placed into a knee immobilizer. She subsequently had a new injury on 03/28/2023 where her right leg gave out while wearing the knee immobilizer causing increased bleeding. She was BIBA to the ED and admitted to the medical service in anticipation of surgical intervention. On exam she was found to have a grade 3 open dehiscence of a right total knee arthroplasty wound and subsequently underwent an open right patellar tendon rupture repair, irrigation and excisional debridement, antibiotic bead placement, extensor tendon tendon reinforcement with cerclage cable, poly exchange and closure. She followed up with Dr. Lucille Passy office on 04/14/2023 and x-ray/clinical evaluation showed evidence for failure of the patellar tendon repair with patella alta and displacement of the cable. She had an infectious work up with Dr. Lorel Monaco that  was negative; she underwent surgery on 05/19/23 and discharged home the next day after she was put into a long leg cast on the floor with a window for her Wound Vac.    Cast was modified 06/03/2023. She feels the cast is at the upper portion of her thigh and it is quite uncomfortable. No new injuries or traumas. Pain is controlled with Tylenol, Celebrex, oxycodone as needed.     No local/systemic symptoms/signs of infection.    She has been WBAT using a walker or cane and mobilizes with wheelchair also.    Taking ASA 81 mg PO BID for DVTp.    IMAGING: x-ray of the right knee show a constrained TKA in good alignment. No evidence for hardware failure. Some cement lateral to the proximal tibia presumably from extrusion out of the hole from previous cerclage cable.        PHYSICAL EXAM:  GENERAL: Alert and oriented, in no acute distress  MOOD: Appropriate.   MUSCULOSKELETAL: Long leg cast removed today.  Evaluation of the right lower extremity reveals no skin problems from the cast. Skin is warm, dry, intact. Surgical wound is healing well.  Nylon sutures in  place.  No signs of infection.  Thigh and calf soft and NTTP. Readily able to wiggle toes. NVI distally.    ASSESSMENT/PLAN: 68 year old female who presents today for cast change now 6 weeks postop from a right knee removal of hardware, revision total knee replacement to a constrained prosthesis, extensor mechanism reconstruction, patellectomy on 05/19/2023.  Cast changed today.  Surgical wound healing well.  Sutures removed.  Continue WBAT using  walker or cane in a long leg cast.  Continue DVTp.  FU in 3 weeks for re-evaluation and x-rays IN CAST and a cast change.    We reviewed the likely diagnosis, the prognosis, and various treatment options in detail. Risks and benefits of treatment plan discussed. The patient's questions have been answered, and the patient understands agrees with treatment plan.     I answered all the patient's questions today to the best of my  ability. Patient expressed understanding of an agreement with the plan. Patient will return or report to the ER immediately with any new concerning symptoms for DVT, PE, or infection.     I spent a total of 30 minutes on this visit on the date of service (total time includes all activities performed on the date of service) In that time I performed a complete history and physical exam, reviewed and interpreted patient imaging, coordinated the next steps of the patients care, communicated the plan and diagnosis, placed orders, and preformed appropriate documentation.    Above is my independent interpretation of radiographic studies listed, which were reviewed by me today.      [name], MD      Nursing Communication:    Follow up in 3 weeks with new xrays.

## 2023-07-07 ENCOUNTER — Ambulatory Visit: Payer: No Typology Code available for payment source | Attending: Internal Medicine | Admitting: Internal Medicine

## 2023-07-07 ENCOUNTER — Encounter (HOSPITAL_BASED_OUTPATIENT_CLINIC_OR_DEPARTMENT_OTHER): Payer: Self-pay | Admitting: Internal Medicine

## 2023-07-07 DIAGNOSIS — F1120 Opioid dependence, uncomplicated: Secondary | ICD-10-CM | POA: Insufficient documentation

## 2023-07-07 DIAGNOSIS — Z96651 Presence of right artificial knee joint: Secondary | ICD-10-CM | POA: Insufficient documentation

## 2023-07-07 DIAGNOSIS — F419 Anxiety disorder, unspecified: Secondary | ICD-10-CM | POA: Diagnosis not present

## 2023-07-07 DIAGNOSIS — J449 Chronic obstructive pulmonary disease, unspecified: Secondary | ICD-10-CM | POA: Insufficient documentation

## 2023-07-07 DIAGNOSIS — G8918 Other acute postprocedural pain: Secondary | ICD-10-CM | POA: Insufficient documentation

## 2023-07-07 MED ORDER — OXYCODONE HCL 5 MG PO TABS
5.0000 mg | ORAL_TABLET | Freq: Every day | ORAL | 0 refills | Status: DC | PRN
Start: 2023-07-08 — End: 2023-07-28

## 2023-07-07 NOTE — Progress Notes (Signed)
 Subjective     Cheryl Dixon is a 68 year old female with COPD, mood disorder, OUD, complicated recent surgery seen for anxiety and pain management    History of Present Illness  Anxiety  She manages her anxiety disorder with clonazepam 0.5 mg twice daily, which effectively reduces irritability and improves sleep.  There was a big improvement in her anxiety symptoms after she started taking this.    Postoperative pain  For pain management, she takes oxycodone 5 mg three times daily but plans to reduce the dosage to once daily. She regularly uses Suboxone to manage her opioid use disorder, noting that the correct strength has been provided after previous issues with a non-preferred generic.  She is hoping to get her cast off in 6 days.  She is walking with her cast and has mild swelling in both feet.  No shortness of breath or orthopnea    COPD  She uses Stiolto once daily, preferring to take it at night to avoid jitteriness. She has not used albuterol recently due to its side effect of causing heart racing. She reports no current breathing problems and no longer needs to sleep with her head elevated.    Smoking  She is not smoking at all.    Patient Active Problem List:     Opioid dependence on agonist therapy (HCC)     Tobacco use disorder     Fibrocystic breast     Essential hypertension     Knee pain     Chronic low back pain     OA (osteoarthritis) of knee     H. pylori infection     Major depressive disorder, recurrent episode, severe (HCC)     Occult blood in stools     Prediabetes     Epigastric pain     Chronic obstructive pulmonary disease (HCC)     Left foot pain     Multiple polyps of colon     Acquired deformity of toe, right     Dislocation of metatarsophalangeal joint of lesser toe, right, sequela     Postmenopausal atrophic vaginitis     Abnormal loss of weight     Aneurysm of ascending aorta without rupture (HCC)     COVID-19 virus infection     BMI 32.0-32.9,adult     Chronic dysuria     LLQ pain      Osteoporosis     History of endometriosis     Lung cancer screening     Constipation, drug induced     Weakness     Cerebrovascular accident (CVA) (HCC)     Overactive bladder      [facility] [phone] Option #4     Substance addiction (HCC)     Anxiety     Obese     S/P total knee arthroplasty, right     Patellar tendon rupture, right, initial encounter     Status post total right knee replacement     Rupture patellar tendon, right, subsequent encounter     Patellar tendon rupture, right, sequela     S/P revision of total knee, right     Gait instability     Pain of right lower extremity    clonazePAM (KLONOPIN) 0.5 MG tablet, Take 1 tablet by mouth 2 (two) times daily as needed for Anxiety  for up to 14 days, Disp: 14 tablet, Rfl: 1  oxyCODONE (ROXICODONE) 5 MG immediate release tablet, Take 1 tablet by mouth  every 8 (eight) hours as needed for Pain Max Daily Amount: 15 mg  for up to 14 days, Disp: 42 tablet, Rfl: 0  hydrOXYzine (ATARAX) 25 MG tablet, Take 1 tablet by mouth nightly as needed for Itching, Disp: 30 tablet, Rfl: 0  celecoxib (CELEBREX) 200 MG capsule, Take 1 capsule by mouth daily, Disp: 30 capsule, Rfl: 0  acetaminophen (TYLENOL) 500 MG tablet, Take 2 tablets by mouth every 8 (eight) hours as needed for Pain, Disp: 60 tablet, Rfl: 0  buprenorphine-naloxone (SUBOXONE) 8-2 MG sublingual film, Take 1.5 films under the tongue in the morning, 1 film midday, and 1 film at night, Disp: 98 Film, Rfl: 0  estrogen, conjugated, (PREMARIN) 0.625 MG/GM vaginal cream, Place 0.5 g vaginally daily, Disp: 30 g, Rfl: 2  hydrocortisone 2.5 % cream, Apply topically 2 (two) times daily To affected areas of scalp and ears, Disp: 56 g, Rfl: 1  polyethylene glycol (GLYCOLAX/MIRALAX) 17 g packet, Take 1 packet by mouth daily, Disp: 90 packet, Rfl: 3  albuterol HFA 108 (90 Base) MCG/ACT inhaler, Inhale 2 puffs into the lungs every 6 (six) hours as needed for Wheezing, Disp: , Rfl:   docusate sodium  (COLACE) 100 MG capsule, TAKE 1 CAPSULE BY MOUTH IN THE MORNING AND BEFORE BEDTIME, Disp: 180 capsule, Rfl: 3  tiotropium-olodaterol (STIOLTO RESPIMAT) 2.5-2.5 MCG/ACT inhal, Inhale 2 puffs into the lungs daily, Disp: , Rfl:   buPROPion (WELLBUTRIN SR) 200 MG 12 hr tablet, TAKE 1 TABLET BY MOUTH IN THE MORNING AND BEFORE BEDTIME, Disp: 180 tablet, Rfl: 3  cetirizine (ZYRTEC) 10 MG tablet, TAKE 1 TABLET BY MOUTH EVERY DAY IN THE MORNING, Disp: 90 tablet, Rfl: 3  VITAMIN D3 50 MCG (2000 UT) TABS tablet, TAKE 1 TABLET BY MOUTH EVERY DAY IN THE MORNING, Disp: 90 tablet, Rfl: 3  atorvastatin (LIPITOR) 80 MG tablet, Take 1 tablet by mouth Daily after dinner (Patient not taking: Reported on 05/27/2023), Disp: 90 tablet, Rfl: 3    No current facility-administered medications for this visit.    Review of Patient's Allergies indicates:   Darvon                     Meperidine hcl              Comment:Nausea/vomit   Paxil [paroxetine]      Rash   Zoloft [sertraline *    Rash          Objective     LMP 11/20/1991   Physical Exam       Results           Plan   Assessment & Plan  Postoperative pain  Experiencing postoperative pain post-knee surgery. Currently on oxycodone.  - Reduce oxycodone to 5 mg once daily starting tomorrow.  - Plan to taper off oxycodone in another week or two.    Anxiety disorder  Anxiety well-managed with clonazepam. Bupropion may not be optimal for anxiety.  - Continue clonazepam 0.5 mg twice daily for another two weeks.  - Plan to taper clonazepam after two weeks.  - Consider changing bupropion to another medication in the next month or two.    Opioid use disorder  Stable on Suboxone for opioid use disorder. Advised scheduled dosing for consistency.  - Continue Suboxone on a scheduled basis.    Chronic obstructive pulmonary disease (COPD)  Uses Stiolto inhaler causing jitteriness, taken at night. Smoking cessation important for wound healing.  - Continue Stiolto inhaler once daily,  preferably at night.  -  Use albuterol as needed.    The patient verbally consented to an audio recording of their visit to assist with the completion of documentation. The patient is aware the recording is not retained after the visit is summarized.  Hildred Laser, MD

## 2023-07-08 ENCOUNTER — Other Ambulatory Visit (HOSPITAL_BASED_OUTPATIENT_CLINIC_OR_DEPARTMENT_OTHER): Payer: Self-pay | Admitting: Internal Medicine

## 2023-07-08 NOTE — Telephone Encounter (Signed)
 This prescription refill request has passed the Rx Renewal Authorization protocol.  This medication has been approved and sent to the patient's preferred pharmacy.      PER Pharmacy, Cheryl Dixon is a 68 year old female has requested a refill of atorvastatin 80 mg .      Last Office Visit: 07/07/23 with D Roll   Last Physical Exam: 05/27/13    There are no preventive care reminders to display for this patient.    Other Med Adult:  Most Recent BP Reading(s)  06/26/23 : 129/88        Cholesterol (mg/dL)   Date Value   16/01/9603 212     LOW DENSITY LIPOPROTEIN DIRECT (mg/dL)   Date Value   54/12/8117 151     HIGH DENSITY LIPOPROTEIN (mg/dL)   Date Value   14/78/2956 47     TRIGLYCERIDES (mg/dL)   Date Value   21/30/8657 119         THYROID SCREEN TSH REFLEX FT4 (uIU/mL)   Date Value   08/20/2022 4.100         No results found for: "TSH"    HEMOGLOBIN A1C (%)   Date Value   08/20/2022 5.6       No results found for: "POCA1C"      INR (no units)   Date Value   12/12/2021 1.1   02/16/2007 1.0 (L)   07/03/2006 < 1.0 (L)       SODIUM (mmol/L)   Date Value   06/25/2023 140       POTASSIUM (mmol/L)   Date Value   06/25/2023 4.0           CREATININE (mg/dL)   Date Value   84/69/6295 0.8       Documented patient preferred pharmacies:    [id number] - [place], [place] - [address]  Phone: [phone] Fax: [phone]

## 2023-07-09 ENCOUNTER — Other Ambulatory Visit (HOSPITAL_BASED_OUTPATIENT_CLINIC_OR_DEPARTMENT_OTHER): Payer: Self-pay | Admitting: Internal Medicine

## 2023-07-09 DIAGNOSIS — N3281 Overactive bladder: Secondary | ICD-10-CM

## 2023-07-09 NOTE — Telephone Encounter (Signed)
 PER Pharmacy, Cheryl Dixon is a 68 year old female has requested a refill of solifenacin.      Last Office Visit: [id number] with Roll, D  Last Physical Exam: [id number]      Other Med Adult:  Most Recent BP Reading(s)  06/26/23 : 129/88        Cholesterol (mg/dL)   Date Value   14/78/2956 212     LOW DENSITY LIPOPROTEIN DIRECT (mg/dL)   Date Value   21/30/8657 151     HIGH DENSITY LIPOPROTEIN (mg/dL)   Date Value   84/69/6295 47     TRIGLYCERIDES (mg/dL)   Date Value   28/41/3244 119         THYROID SCREEN TSH REFLEX FT4 (uIU/mL)   Date Value   08/20/2022 4.100         No results found for: "TSH"    HEMOGLOBIN A1C (%)   Date Value   08/20/2022 5.6       No results found for: "POCA1C"      INR (no units)   Date Value   12/12/2021 1.1   02/16/2007 1.0 (L)   07/03/2006 < 1.0 (L)       SODIUM (mmol/L)   Date Value   06/25/2023 140       POTASSIUM (mmol/L)   Date Value   06/25/2023 4.0           CREATININE (mg/dL)   Date Value   04/30/7251 0.8       Documented patient preferred pharmacies:    [id number] - [place], [place] - [address]  Phone: [phone] Fax: [phone]  Phone: [phone] Fax: [phone]

## 2023-07-10 ENCOUNTER — Ambulatory Visit (HOSPITAL_BASED_OUTPATIENT_CLINIC_OR_DEPARTMENT_OTHER): Admitting: Anesthesiology

## 2023-07-10 ENCOUNTER — Ambulatory Visit (HOSPITAL_BASED_OUTPATIENT_CLINIC_OR_DEPARTMENT_OTHER)
Admission: RE | Admit: 2023-07-10 | Discharge: 2023-07-10 | Disposition: A | Discharge status: Home / Self Care | Source: Ambulatory Visit | Attending: Orthopedic Surgery | Admitting: Orthopedic Surgery

## 2023-07-10 ENCOUNTER — Encounter (HOSPITAL_BASED_OUTPATIENT_CLINIC_OR_DEPARTMENT_OTHER): Payer: Self-pay | Admitting: Orthopedic Surgery

## 2023-07-10 ENCOUNTER — Telehealth: Payer: Self-pay | Admitting: Orthopedic Surgery

## 2023-07-10 ENCOUNTER — Other Ambulatory Visit: Payer: Self-pay | Admitting: Orthopedic Surgery

## 2023-07-10 ENCOUNTER — Encounter (HOSPITAL_BASED_OUTPATIENT_CLINIC_OR_DEPARTMENT_OTHER)
Admission: RE | Disposition: A | Discharge status: Home / Self Care | Payer: Self-pay | Source: Ambulatory Visit | Attending: Orthopedic Surgery

## 2023-07-10 ENCOUNTER — Other Ambulatory Visit: Payer: Self-pay

## 2023-07-10 DIAGNOSIS — G4730 Sleep apnea, unspecified: Secondary | ICD-10-CM | POA: Diagnosis not present

## 2023-07-10 DIAGNOSIS — E119 Type 2 diabetes mellitus without complications: Secondary | ICD-10-CM | POA: Diagnosis not present

## 2023-07-10 DIAGNOSIS — Z7984 Long term (current) use of oral hypoglycemic drugs: Secondary | ICD-10-CM | POA: Insufficient documentation

## 2023-07-10 DIAGNOSIS — M67441 Ganglion, right hand: Secondary | ICD-10-CM

## 2023-07-10 DIAGNOSIS — J45909 Unspecified asthma, uncomplicated: Secondary | ICD-10-CM | POA: Insufficient documentation

## 2023-07-10 DIAGNOSIS — K219 Gastro-esophageal reflux disease without esophagitis: Secondary | ICD-10-CM | POA: Diagnosis not present

## 2023-07-10 DIAGNOSIS — I10 Essential (primary) hypertension: Secondary | ICD-10-CM | POA: Insufficient documentation

## 2023-07-10 DIAGNOSIS — F418 Other specified anxiety disorders: Secondary | ICD-10-CM

## 2023-07-10 DIAGNOSIS — E66813 Obesity, class 3: Secondary | ICD-10-CM | POA: Diagnosis not present

## 2023-07-10 DIAGNOSIS — M797 Fibromyalgia: Secondary | ICD-10-CM | POA: Diagnosis not present

## 2023-07-10 DIAGNOSIS — Z6841 Body Mass Index (BMI) 40.0 and over, adult: Secondary | ICD-10-CM | POA: Diagnosis not present

## 2023-07-10 DIAGNOSIS — Z8679 Personal history of other diseases of the circulatory system: Secondary | ICD-10-CM

## 2023-07-10 DIAGNOSIS — M67449 Ganglion, unspecified hand: Secondary | ICD-10-CM

## 2023-07-10 HISTORY — DX: Unspecified adrenocortical insufficiency: E27.40

## 2023-07-10 HISTORY — DX: Type 2 diabetes mellitus without complications: E11.9

## 2023-07-10 HISTORY — PX: CYST REMOVAL HAND: SHX6279

## 2023-07-10 LAB — GLUCOSE, CAPILLARY
Glucose-Capillary: 105 mg/dL — ABNORMAL HIGH (ref 70–99)
Glucose-Capillary: 117 mg/dL — ABNORMAL HIGH (ref 70–99)

## 2023-07-10 SURGERY — REMOVAL, CYST, HAND
Anesthesia: Monitor Anesthesia Care | Site: Finger | Laterality: Right

## 2023-07-10 MED ORDER — ACETAMINOPHEN 500 MG PO TABS
ORAL_TABLET | ORAL | Status: AC
  Filled 2023-07-10: qty 2

## 2023-07-10 MED ORDER — ACETAMINOPHEN-CODEINE 300-30 MG PO TABS: 1.0000 | ORAL_TABLET | Freq: Four times a day (QID) | ORAL | 0 refills | Status: DC | PRN

## 2023-07-10 MED ORDER — FENTANYL CITRATE (PF) 100 MCG/2ML IJ SOLN
INTRAMUSCULAR | Status: AC
  Filled 2023-07-10: qty 2

## 2023-07-10 MED ORDER — MIDAZOLAM HCL 2 MG/2ML IJ SOLN
INTRAMUSCULAR | Status: AC
  Filled 2023-07-10: qty 2

## 2023-07-10 MED ORDER — DIPHENHYDRAMINE HCL 50 MG/ML IJ SOLN
INTRAMUSCULAR | Status: DC | PRN
  Administered 2023-07-10: 6.25 mg via INTRAVENOUS

## 2023-07-10 MED ORDER — LACTATED RINGERS IV SOLN: INTRAVENOUS | Status: DC

## 2023-07-10 MED ORDER — CEFAZOLIN SODIUM-DEXTROSE 2-4 GM/100ML-% IV SOLN
2.0000 g | INTRAVENOUS | Status: AC
  Administered 2023-07-10: 2 g via INTRAVENOUS

## 2023-07-10 MED ORDER — LIDOCAINE HCL (PF) 1 % IJ SOLN
INTRAMUSCULAR | Status: AC
  Filled 2023-07-10: qty 30

## 2023-07-10 MED ORDER — ONDANSETRON HCL 4 MG/2ML IJ SOLN
INTRAMUSCULAR | Status: AC
  Filled 2023-07-10: qty 2

## 2023-07-10 MED ORDER — PROPOFOL 10 MG/ML IV BOLUS
INTRAVENOUS | Status: DC | PRN
  Administered 2023-07-10 (×2): 20 mg via INTRAVENOUS

## 2023-07-10 MED ORDER — LIDOCAINE HCL (CARDIAC) PF 100 MG/5ML IV SOSY
PREFILLED_SYRINGE | INTRAVENOUS | Status: DC | PRN
  Administered 2023-07-10: 20 mg via INTRAVENOUS

## 2023-07-10 MED ORDER — LIDOCAINE 2% (20 MG/ML) 5 ML SYRINGE
INTRAMUSCULAR | Status: AC
  Filled 2023-07-10: qty 5

## 2023-07-10 MED ORDER — LIDOCAINE HCL 1 % IJ SOLN
INTRAMUSCULAR | Status: DC | PRN
  Administered 2023-07-10: 10 mL

## 2023-07-10 MED ORDER — MIDAZOLAM HCL 5 MG/5ML IJ SOLN
INTRAMUSCULAR | Status: DC | PRN
  Administered 2023-07-10 (×2): 1 mg via INTRAVENOUS

## 2023-07-10 MED ORDER — FENTANYL CITRATE (PF) 100 MCG/2ML IJ SOLN
INTRAMUSCULAR | Status: DC | PRN
  Administered 2023-07-10 (×2): 50 ug via INTRAVENOUS

## 2023-07-10 MED ORDER — FENTANYL CITRATE (PF) 100 MCG/2ML IJ SOLN: 25.0000 ug | INTRAMUSCULAR | Status: DC | PRN

## 2023-07-10 MED ORDER — ACETAMINOPHEN 500 MG PO TABS
1000.0000 mg | ORAL_TABLET | Freq: Once | ORAL | Status: AC
  Administered 2023-07-10: 1000 mg via ORAL

## 2023-07-10 MED ORDER — PHENYLEPHRINE 80 MCG/ML (10ML) SYRINGE FOR IV PUSH (FOR BLOOD PRESSURE SUPPORT)
PREFILLED_SYRINGE | INTRAVENOUS | Status: AC
  Filled 2023-07-10: qty 10

## 2023-07-10 MED ORDER — PROPOFOL 500 MG/50ML IV EMUL
INTRAVENOUS | Status: DC | PRN
  Administered 2023-07-10: 125 ug/kg/min via INTRAVENOUS

## 2023-07-10 MED ORDER — ONDANSETRON HCL 4 MG/2ML IJ SOLN
INTRAMUSCULAR | Status: DC | PRN
  Administered 2023-07-10: 4 mg via INTRAVENOUS

## 2023-07-10 MED ORDER — CEFAZOLIN SODIUM-DEXTROSE 2-4 GM/100ML-% IV SOLN
INTRAVENOUS | Status: AC
  Filled 2023-07-10: qty 100

## 2023-07-10 MED ORDER — 0.9 % SODIUM CHLORIDE (POUR BTL) OPTIME
TOPICAL | Status: DC | PRN
  Administered 2023-07-10: 1000 mL

## 2023-07-10 MED ORDER — EPHEDRINE 5 MG/ML INJ
INTRAVENOUS | Status: AC
  Filled 2023-07-10: qty 5

## 2023-07-10 MED ORDER — SUCCINYLCHOLINE CHLORIDE 200 MG/10ML IV SOSY
PREFILLED_SYRINGE | INTRAVENOUS | Status: AC
  Filled 2023-07-10: qty 10

## 2023-07-10 SURGICAL SUPPLY — 32 items
BLADE SURG 15 STRL LF DISP TIS (BLADE) ×4 IMPLANT
BNDG COHESIVE 4X5 TAN STRL LF (GAUZE/BANDAGES/DRESSINGS) ×2 IMPLANT
BNDG ELASTIC 4INX 5YD STR LF (GAUZE/BANDAGES/DRESSINGS) ×2 IMPLANT
CHLORAPREP W/TINT 26 (MISCELLANEOUS) ×2 IMPLANT
CORD BIPOLAR FORCEPS 12FT (ELECTRODE) ×1 IMPLANT
COVER BACK TABLE 60X90IN (DRAPES) ×1 IMPLANT
CUFF TOURN SGL QUICK 18X4 (TOURNIQUET CUFF) ×2 IMPLANT
CUFF TRNQT CYL 24X4X40X1 (TOURNIQUET CUFF) IMPLANT
DRAPE HAND 77X146 (DRAPES) ×1 IMPLANT
DRSG TELFA 3X8 NADH STRL (GAUZE/BANDAGES/DRESSINGS) IMPLANT
GAUZE SPONGE 4X4 12PLY STRL (GAUZE/BANDAGES/DRESSINGS) ×2 IMPLANT
GAUZE STRETCH 2X75IN STRL (MISCELLANEOUS) ×1 IMPLANT
GAUZE XEROFORM 1X8 LF (GAUZE/BANDAGES/DRESSINGS) ×2 IMPLANT
GLOVE BIO SURGEON STRL SZ7.5 (GLOVE) ×2 IMPLANT
GLOVE BIOGEL PI IND STRL 7.5 (GLOVE) ×2 IMPLANT
GOWN STRL REUS W/ TWL LRG LVL3 (GOWN DISPOSABLE) ×4 IMPLANT
GOWN STRL REUS W/TWL XL LVL3 (GOWN DISPOSABLE) IMPLANT
GOWN STRL SURGICAL XL XLNG (GOWN DISPOSABLE) ×1 IMPLANT
NDL HYPO 25X5/8 SAFETYGLIDE (NEEDLE) IMPLANT
NEEDLE HYPO 25X5/8 SAFETYGLIDE (NEEDLE) IMPLANT
NS IRRIG 1000ML POUR BTL (IV SOLUTION) IMPLANT
PACK BASIN DAY SURGERY FS (CUSTOM PROCEDURE TRAY) ×1 IMPLANT
SHEET MEDIUM DRAPE 40X70 STRL (DRAPES) ×1 IMPLANT
SPIKE FLUID TRANSFER (MISCELLANEOUS) IMPLANT
SPLINT FINGER 2.25 911902 (SOFTGOODS) IMPLANT
STOCKINETTE IMPERVIOUS 9X36 MD (GAUZE/BANDAGES/DRESSINGS) IMPLANT
SUCTION TUBE FRAZIER 10FR DISP (SUCTIONS) IMPLANT
SUT ETHILON 4 0 PS 2 18 (SUTURE) IMPLANT
SYR BULB EAR ULCER 3OZ GRN STR (SYRINGE) ×4 IMPLANT
SYR CONTROL 10ML LL (SYRINGE) IMPLANT
TOWEL GREEN STERILE FF (TOWEL DISPOSABLE) ×4 IMPLANT
TUBE CONNECTING 20X1/4 (TUBING) IMPLANT

## 2023-07-10 NOTE — Transfer of Care (Signed)
 Immediate Anesthesia Transfer of Care Note  Patient: Michelle Roberts  Procedure(s) Performed: REMOVAL, CYST, HAND (Right: Finger)  Patient Location: PACU  Anesthesia Type:MAC  Level of Consciousness: awake, alert , oriented, and patient cooperative  Airway & Oxygen Therapy: Patient Spontanous Breathing and Patient connected to face mask oxygen  Post-op Assessment: Report given to RN and Post -op Vital signs reviewed and stable  Post vital signs: Reviewed and stable  Last Vitals:  Vitals Value Taken Time  BP    Temp    Pulse    Resp    SpO2      Last Pain:  Vitals:   07/10/23 1038  TempSrc: Temporal  PainSc: 0-No pain         Complications: No notable events documented.

## 2023-07-10 NOTE — Anesthesia Preprocedure Evaluation (Addendum)
 Anesthesia Evaluation  Patient identified by MRN, date of birth, ID band Patient awake    Reviewed: Allergy & Precautions, H&P , NPO status , Patient's Chart, lab work & pertinent test results  Airway Mallampati: III  TM Distance: >3 FB Neck ROM: Full    Dental no notable dental hx. (+) Teeth Intact, Dental Advisory Given   Pulmonary asthma , sleep apnea and Continuous Positive Airway Pressure Ventilation    Pulmonary exam normal breath sounds clear to auscultation       Cardiovascular hypertension, Pt. on medications  Rhythm:Regular Rate:Normal     Neuro/Psych   Anxiety Depression    negative neurological ROS     GI/Hepatic Neg liver ROS,GERD  Medicated,,  Endo/Other  diabetes, Type 2, Oral Hypoglycemic AgentsHypothyroidism  Class 3 obesity  Renal/GU negative Renal ROS  negative genitourinary   Musculoskeletal  (+) Arthritis , Osteoarthritis,  Fibromyalgia -  Abdominal   Peds  Hematology  (+) Blood dyscrasia, anemia   Anesthesia Other Findings   Reproductive/Obstetrics                             Anesthesia Physical Anesthesia Plan  ASA: 3  Anesthesia Plan: MAC   Post-op Pain Management: Tylenol PO (pre-op)*   Induction: Intravenous  PONV Risk Score and Plan: 3 and Ondansetron, Propofol infusion and Dexamethasone  Airway Management Planned: Natural Airway and Simple Face Mask  Additional Equipment:   Intra-op Plan:   Post-operative Plan:   Informed Consent: I have reviewed the patients History and Physical, chart, labs and discussed the procedure including the risks, benefits and alternatives for the proposed anesthesia with the patient or authorized representative who has indicated his/her understanding and acceptance.     Dental advisory given  Plan Discussed with: CRNA  Anesthesia Plan Comments:        Anesthesia Quick Evaluation

## 2023-07-10 NOTE — Telephone Encounter (Signed)
 Per [name]-Dr. Fara Boros was not going to prescribe pain medication as patient takes chronic pain meds. I called patient back to relay this information and she states that she does not take pain medication. She takes gabapentin, tylenol, and xanax, but nothing for pain. She states that they discussed tylenol #3.  I called Lequita Halt back who will speak to Dr. Fara Boros.

## 2023-07-10 NOTE — Anesthesia Postprocedure Evaluation (Signed)
 Anesthesia Post Note  Patient: Michelle Roberts  Procedure(s) Performed: REMOVAL, CYST, HAND (Right: Finger)     Patient location during evaluation: PACU Anesthesia Type: MAC Level of consciousness: awake and alert Pain management: pain level controlled Vital Signs Assessment: post-procedure vital signs reviewed and stable Respiratory status: spontaneous breathing, nonlabored ventilation and respiratory function stable Cardiovascular status: stable and blood pressure returned to baseline Postop Assessment: no apparent nausea or vomiting Anesthetic complications: no  No notable events documented.  Last Vitals:  Vitals:   07/10/23 1247 07/10/23 1315  BP: 99/67 (!) 105/92  Pulse: 68 67  Resp: 17 16  Temp: (!) 36.1 C 36.5 C  SpO2: 95% 96%    Last Pain:  Vitals:   07/10/23 1315  TempSrc:   PainSc: 0-No pain                 Charmeka Freeburg,W. EDMOND

## 2023-07-10 NOTE — Telephone Encounter (Signed)
 Pt called requesting refills had surgery today and no meds at pharmacy please asap. Pt phone number is [phone].

## 2023-07-10 NOTE — Telephone Encounter (Signed)
 I called Dr. Fara Boros.  He is going to get to computer and submit medications for patient. I called patient and advised.

## 2023-07-10 NOTE — Discharge Instructions (Addendum)
    Hand Surgery Postop Instructions   Dressings: Maintain postoperative dressing until orthopedic follow-up.  Keep operative site clean and dry until orthopedic follow-up.  Wound Care: Keep your hand elevated above the level of your heart.  Do not allow it to dangle by your side. Moving your fingers is advised to stimulate circulation but will depend on the site of your surgery.  If you have a splint applied, your doctor will advise you regarding movement.  Activity: Do not drive or operate machinery until clearance given from physician. No heavy lifting with operative extremity.  Diet:  Drink liquids today or eat a light diet.  You may resume a regular diet tomorrow.    General expectations: Take prescribed medication if given, transition to over-the-counter medication as quickly as possible. Fingers may become slightly swollen.  Call your doctor if any of the following occur: Severe pain not relieved by pain medication. Elevated temperature. Dressing soaked with blood. Inability to move fingers. White or bluish color to fingers.   Per [facility] clinic policy, our goal is ensure optimal postoperative pain control with a multimodal pain management strategy. For all [facility] patients, our goal is to wean post-operative narcotic medications by 6 weeks post-operatively. If this is not possible due to utilization of pain medication prior to surgery, your [facility] doctor will support your acute post-operative pain control for the first 6 weeks postoperatively, with a plan to transition you back to your primary pain team following that. Cyndia Skeeters will work to ensure a Therapist, occupational.  Anshul Trevor Mace, M.D. Hand Surgery [facility]    Post Anesthesia Home Care Instructions  Activity: Get plenty of rest for the remainder of the day. A responsible individual must stay with you for 24 hours following the procedure.  For the next 24 hours, DO NOT: -Drive a  car -Advertising copywriter -Drink alcoholic beverages -Take any medication unless instructed by your physician -Make any legal decisions or sign important papers.  Meals: Start with liquid foods such as gelatin or soup. Progress to regular foods as tolerated. Avoid greasy, spicy, heavy foods. If nausea and/or vomiting occur, drink only clear liquids until the nausea and/or vomiting subsides. Call your physician if vomiting continues.  Special Instructions/Symptoms: Your throat may feel dry or sore from the anesthesia or the breathing tube placed in your throat during surgery. If this causes discomfort, gargle with warm salt water. The discomfort should disappear within 24 hours.  If you had a scopolamine patch placed behind your ear for the management of post- operative nausea and/or vomiting:  1. The medication in the patch is effective for 72 hours, after which it should be removed.  Wrap patch in a tissue and discard in the trash. Wash hands thoroughly with soap and water. 2. You may remove the patch earlier than 72 hours if you experience unpleasant side effects which may include dry mouth, dizziness or visual disturbances. 3. Avoid touching the patch. Wash your hands with soap and water after contact with the patch.     Last received tylenol at 1050am

## 2023-07-10 NOTE — Interval H&P Note (Signed)
 History and Physical Interval Note:  07/10/2023 10:13 AM  Michelle Roberts  has presented today for surgery, with the diagnosis of RIGHT LONG FINGER MUCOUS CYST.  The various methods of treatment have been discussed with the patient and family. After consideration of risks, benefits and other options for treatment, the patient has consented to  Procedure(s) with comments: REMOVAL, CYST, HAND (Right) - RIGHT LONG FINGER MUCOUS CYST EXCISION WITH DIP JOINT DEBRIDEMENT as a surgical intervention.  The patient's history has been reviewed, patient examined, no change in status, stable for surgery.  I have reviewed the patient's chart and labs.  Questions were answered to the patient's satisfaction.     Mekisha Bittel

## 2023-07-11 ENCOUNTER — Other Ambulatory Visit (HOSPITAL_BASED_OUTPATIENT_CLINIC_OR_DEPARTMENT_OTHER): Payer: Self-pay | Admitting: Physician Assistant

## 2023-07-11 ENCOUNTER — Encounter (HOSPITAL_BASED_OUTPATIENT_CLINIC_OR_DEPARTMENT_OTHER): Payer: Self-pay | Admitting: Orthopedic Surgery

## 2023-07-11 NOTE — Op Note (Signed)
 NAME: Michelle Roberts MEDICAL RECORD NO: [id number] DATE OF BIRTH: May 06, 1955 FACILITY: [facility] LOCATION: [facility] PHYSICIAN: Samuella Cota, MD   OPERATIVE REPORT   DATE OF PROCEDURE: 07/11/23    PREOPERATIVE DIAGNOSIS: Right long finger DIP mucous cyst   POSTOPERATIVE DIAGNOSIS: Right long finger DIP mucous cyst   PROCEDURE: Right long finger mucous cyst excision with associated DIP joint debridement   SURGEON:  Samuella Cota, M.D.   ASSISTANT: Glynn Octave, OPA   ANESTHESIA:  Local with sedation   INTRAVENOUS FLUIDS:  Per anesthesia flow sheet.   ESTIMATED BLOOD LOSS:  Minimal.   COMPLICATIONS:  None.   SPECIMENS: Mass excised from the right long finger sent for permanent specimen   TOURNIQUET TIME:   Right upper arm, 17 minutes   DISPOSITION:  Stable to PACU.   INDICATIONS: This is a 68 year old female who was seen in the outpatient setting and found to have a symptomatic right long finger mucous cyst.  Given her ongoing pain, patient was indicated for right long finger mucous cyst excision.  We discussed the underlying arthritic nature of the DIP joint of the right long finger and the underlying osteophyte formation.  We discussed DIP joint debridement with osteophyte excision to be performed in conjunction with the surgery as well.  We did discuss that the mucous cyst could recur, this may warrant DIP fusion in the future for ongoing DIP arthritis.  Patient was not interested in fusion at this time, elected to proceed with cyst excision and joint debridement.  Given the location of the cyst, we also discussed the possibility for advancement flap in this region for skin defect after excision.  Risks and benefits of surgery were discussed including the risks of infection, bleeding, scarring, stiffness, nerve injury, vascular injury, tendon injury, need for subsequent operation, , recurrence.  She voiced understanding of these risks and elected to  proceed.  OPERATIVE COURSE: Patient was seen and identified in the preoperative area and marked appropriately.  Surgical consent had been signed. Preoperative IV antibiotic prophylaxis was given. She was transferred to the operating room and placed in supine position with the Right upper extremity on an arm board.  Sedation was induced by the anesthesiologist.  Right upper extremity was prepped and draped in normal sterile orthopedic fashion.  A surgical pause was performed between the surgeons, anesthesia, and operating room staff and all were in agreement as to the patient, procedure, and site of procedure.  Tourniquet was placed and padded appropriately to the right upper arm.  10 cc of 1% lidocaine plain was utilized for digital block purposes of the long finger.  The arm was exsanguinated and the tourniquet is inflated to 250 mmHg.  Excision of the mucous cyst was performed.  An L-shaped incision was designed over the dorsum of the finger.  An ellipse was designed about the mucous cyst.  The incision was taken down to the subcutaneous tissues.  The extensor mechanism was identified and carefully protected.  The mucous cyst was then sharply excised and sent for surgical pathology as a permanent specimen.  Following excision of the cyst, DIP joint arthrotomy was performed both radially and ulnarly.  The DIP joint osteophytes were carefully debrided utilizing a rondure.  This was completed to reduce the risk of cyst recurrence.  Bipolar electrocautery was then utilized to cauterize the soft tissues of the areas.  Finally, the skin flap was elevated from the extensor mechanism.  Care was taken avoid injury to the  extensor paratenon.  Skin flap itself measured approximately 1.5 cm x 3 cm in dimension.  Once the flap was elevated, it was advanced distally into the mucous cyst excision defect.  The tourniquet was deflated at 17 minutes.  Fingertips were pink with brisk capillary refill after deflation of  tourniquet.  Skin closure was performed in standard fashion utilizing 4-0 nylon.  Sterile dressings were applied.  Static finger splint was placed maintaining the digit at the DIP in full extension.  The operative drapes were broken down.  The patient was awoken from anesthesia safely and taken to PACU in stable condition.   Post-operative plan: The patient will recover in the post-anesthesia care unit and then be discharged home.  The patient will be non weight bearing on the right upper extremity in a finger splint for the long finger.   I will see the patient back in the office in 2 weeks for postoperative followup.  Discharge instructions were provided on appropriate wound care and dressing maintenance.  Pain medicine was also provided.  Samuella Cota, MD Electronically signed, 07/11/23

## 2023-07-11 NOTE — Telephone Encounter (Signed)
 PER Pharmacy, Cheryl Dixon is a 68 year old female has requested a refill of   hydrOXYzine (ATARAX) 25 MG tablet  .    Last Office Visit: 07/07/2023    Other Med Adult:  Most Recent BP Reading(s)  06/26/23 : 129/88        Cholesterol (mg/dL)   Date Value   16/01/9603 212     LOW DENSITY LIPOPROTEIN DIRECT (mg/dL)   Date Value   54/12/8117 151     HIGH DENSITY LIPOPROTEIN (mg/dL)   Date Value   14/78/2956 47     TRIGLYCERIDES (mg/dL)   Date Value   21/30/8657 119         THYROID SCREEN TSH REFLEX FT4 (uIU/mL)   Date Value   08/20/2022 4.100         No results found for: "TSH"    HEMOGLOBIN A1C (%)   Date Value   08/20/2022 5.6       No results found for: "POCA1C"      INR (no units)   Date Value   12/12/2021 1.1   02/16/2007 1.0 (L)   07/03/2006 < 1.0 (L)       SODIUM (mmol/L)   Date Value   06/25/2023 140       POTASSIUM (mmol/L)   Date Value   06/25/2023 4.0           CREATININE (mg/dL)   Date Value   84/69/6295 0.8       Documented patient preferred pharmacies:    [id number] - [place], [place] - [address]  Phone: [phone] Fax: [phone]

## 2023-07-13 ENCOUNTER — Other Ambulatory Visit: Payer: Self-pay | Admitting: Orthopedic Surgery

## 2023-07-13 ENCOUNTER — Telehealth: Payer: Self-pay

## 2023-07-13 LAB — SURGICAL PATHOLOGY

## 2023-07-13 MED ORDER — OXYCODONE HCL 5 MG PO TABS: 5.0000 mg | ORAL_TABLET | Freq: Four times a day (QID) | ORAL | 0 refills | Status: AC | PRN

## 2023-07-13 NOTE — Telephone Encounter (Signed)
 Spoke with patient. Informed her she is doing everything correct; elevate and ice We will call her in some oxycodone 5mg  for break thru pain  Moved her post op appointment from Wednesday to Thursday to ensure a 2 week follow up

## 2023-07-13 NOTE — Telephone Encounter (Signed)
 Patient called triage. She had surgery with Dr.Ask on 07/10/2023. She states that she has been having a lot of swelling and pain since the block wore off. She said that pain meds are not working. She wants a call back. [phone].

## 2023-07-14 ENCOUNTER — Other Ambulatory Visit (HOSPITAL_BASED_OUTPATIENT_CLINIC_OR_DEPARTMENT_OTHER): Payer: Self-pay

## 2023-07-14 ENCOUNTER — Ambulatory Visit: Admitting: Family Medicine

## 2023-07-14 DIAGNOSIS — R52 Pain, unspecified: Secondary | ICD-10-CM

## 2023-07-15 ENCOUNTER — Encounter (HOSPITAL_BASED_OUTPATIENT_CLINIC_OR_DEPARTMENT_OTHER): Payer: Self-pay

## 2023-07-16 ENCOUNTER — Other Ambulatory Visit (HOSPITAL_BASED_OUTPATIENT_CLINIC_OR_DEPARTMENT_OTHER): Payer: Self-pay | Admitting: Physician Assistant

## 2023-07-16 NOTE — Telephone Encounter (Signed)
 PER Pharmacy, Cheryl Dixon is a 68 year old female has requested a refill of      -  celecoxib       Last Office Visit: 07/07/23 with roll, d.       There are no preventive care reminders to display for this patient.     Other Med Adult:  Most Recent BP Reading(s)  06/26/23 : 129/88        Cholesterol (mg/dL)   Date Value   16/01/9603 212     LOW DENSITY LIPOPROTEIN DIRECT (mg/dL)   Date Value   54/12/8117 151     HIGH DENSITY LIPOPROTEIN (mg/dL)   Date Value   14/78/2956 47     TRIGLYCERIDES (mg/dL)   Date Value   21/30/8657 119         THYROID SCREEN TSH REFLEX FT4 (uIU/mL)   Date Value   08/20/2022 4.100         No results found for: "TSH"    HEMOGLOBIN A1C (%)   Date Value   08/20/2022 5.6       No results found for: "POCA1C"      INR (no units)   Date Value   12/12/2021 1.1   02/16/2007 1.0 (L)   07/03/2006 < 1.0 (L)       SODIUM (mmol/L)   Date Value   06/25/2023 140       POTASSIUM (mmol/L)   Date Value   06/25/2023 4.0           CREATININE (mg/dL)   Date Value   84/69/6295 0.8        Documented patient preferred pharmacies:    [id number] - [place], [place] - [address]  Phone: [phone] Fax: [phone]

## 2023-07-17 ENCOUNTER — Other Ambulatory Visit (HOSPITAL_BASED_OUTPATIENT_CLINIC_OR_DEPARTMENT_OTHER): Payer: Self-pay | Admitting: Internal Medicine

## 2023-07-17 MED ORDER — CLONAZEPAM 0.5 MG PO TABS
0.5000 mg | ORAL_TABLET | Freq: Two times a day (BID) | ORAL | 1 refills | Status: DC | PRN
Start: 2023-07-17 — End: 2023-07-28

## 2023-07-17 NOTE — Telephone Encounter (Signed)
 PER Patient (self),NAME@ is a 68 year old female has requested a refill of klonopin       Last prescribed - start date: 06/30/23 end date: 07/14/23     Last Office Visit: 07/07/23    There are no preventive care reminders to display for this patient.     Other Med Adult:  Most Recent BP Reading(s)  06/26/23 : 129/88        Cholesterol (mg/dL)   Date Value   16/01/9603 212     LOW DENSITY LIPOPROTEIN DIRECT (mg/dL)   Date Value   54/12/8117 151     HIGH DENSITY LIPOPROTEIN (mg/dL)   Date Value   14/78/2956 47     TRIGLYCERIDES (mg/dL)   Date Value   21/30/8657 119         THYROID SCREEN TSH REFLEX FT4 (uIU/mL)   Date Value   08/20/2022 4.100         No results found for: "TSH"    HEMOGLOBIN A1C (%)   Date Value   08/20/2022 5.6       No results found for: "POCA1C"      INR (no units)   Date Value   12/12/2021 1.1   02/16/2007 1.0 (L)   07/03/2006 < 1.0 (L)       SODIUM (mmol/L)   Date Value   06/25/2023 140       POTASSIUM (mmol/L)   Date Value   06/25/2023 4.0           CREATININE (mg/dL)   Date Value   84/69/6295 0.8        Documented patient preferred pharmacies:    [id number] - [place], [place] - [address]  Phone: [phone] Fax: [phone]

## 2023-07-17 NOTE — Telephone Encounter (Signed)
Patient does not meet these criteria

## 2023-07-20 ENCOUNTER — Ambulatory Visit
Admission: RE | Admit: 2023-07-20 | Discharge: 2023-07-20 | Disposition: A | Discharge status: Home / Self Care | Attending: Diagnostic Radiology | Admitting: Diagnostic Radiology

## 2023-07-20 ENCOUNTER — Ambulatory Visit (HOSPITAL_BASED_OUTPATIENT_CLINIC_OR_DEPARTMENT_OTHER): Admitting: Orthopaedic Surgery

## 2023-07-20 ENCOUNTER — Other Ambulatory Visit: Payer: Self-pay

## 2023-07-20 DIAGNOSIS — Z96651 Presence of right artificial knee joint: Secondary | ICD-10-CM

## 2023-07-20 DIAGNOSIS — Z471 Aftercare following joint replacement surgery: Secondary | ICD-10-CM | POA: Diagnosis present

## 2023-07-20 DIAGNOSIS — R52 Pain, unspecified: Secondary | ICD-10-CM

## 2023-07-20 LAB — TB CULT AND SMEAR (STATE LAB)
TB SMEAR STATE LAB: NONE SEEN
TB SMEAR STATE LAB: NONE SEEN
TB SMEAR STATE LAB: NONE SEEN

## 2023-07-20 LAB — AFB SMEAR
SMEAR, FLUORESCENT/ACID STAI: NEGATIVE
SMEAR, FLUORESCENT/ACID STAI: NEGATIVE
SMEAR, FLUORESCENT/ACID STAI: NEGATIVE

## 2023-07-20 NOTE — Progress Notes (Unsigned)
 D/C old long leg cast.       New Long Leg Cast in extension applied by me under the direction of Dr Carolynne Edouard.    C/S/M normal after cast applied.    Cast/splint care Instructions given to and reviewed with patient.  Patient informed to call office if any pain or swelling occur.    Geryl Rankins, 07/20/2023

## 2023-07-22 ENCOUNTER — Encounter: Admitting: Orthopedic Surgery

## 2023-07-23 ENCOUNTER — Encounter: Admitting: Orthopedic Surgery

## 2023-07-23 NOTE — Progress Notes (Signed)
 Orthopedic Office Note    CC: Patient presents with:  Follow Up: Follow up- Right Leg      ORTHOPEDIC PROBLEM LIST:  1. S/p right TKA, DOS: 03/11/2023, Doherty  2. Open right patella tendon rupture (grade 3), DOI: 03/26/2023, s/p poly exchange, irrigation/wound debridement, patellar tendon repair with cerclage cable, DOS: 03/29/2023, Lennie Muckle  3. S/p revision right TKA to constrained prosthesis, extensor mechanism reconstruction, patellectomy and ROH, DOS: 05/19/23, Lorel Monaco    HPI: Cheryl Dixon is a 68 year old female who presents today for postop evaluation for her right knee. She is 9 weeks post op.     To review: Patient has had a complicated postop course regarding her right knee. She was initially a patient of Dr. Baldwin [place] who underwent a primary total knee replacement without complication on 03/11/2023. Unfortunately, she sustained a mechanical fall on 03/26/2023; she was sitting in a chair that was lower than she had expected; felt a slight popping sensation and immediately had pain, bleeding. She was found to have a 2.5 cm inferior wound dehiscence as well as evidence for a patellar tendon rupture. She was placed into a knee immobilizer. She subsequently had a new injury on 03/28/2023 where her right leg gave out while wearing the knee immobilizer causing increased bleeding. She was BIBA to the ED and admitted to the medical service in anticipation of surgical intervention. On exam she was found to have a grade 3 open dehiscence of a right total knee arthroplasty wound and subsequently underwent an open right patellar tendon rupture repair, irrigation and excisional debridement, antibiotic bead placement, extensor tendon tendon reinforcement with cerclage cable, poly exchange and closure. She followed up with Dr. Lucille Passy office on 04/14/2023 and x-ray/clinical evaluation showed evidence for failure of the patellar tendon repair with patella alta and displacement of the cable. She had an infectious  work up with Dr. Lorel Monaco that was negative; she underwent surgery on 05/19/23 and discharged home the next day after she was put into a long leg cast on the floor with a window for her Wound Vac.    Cast was modified 06/29/2023. No new injuries or traumas. Pain is controlled with Tylenol and celecoxib PRN.       No new symptoms. Denies any numbness or tingling distally. No calf pain, chest pain or SOB.     No local/systemic symptoms/signs of infection.    She has been WBAT using a walker or cane and mobilizes with wheelchair also.    Taking ASA 81 mg PO BID for DVTp.    IMAGING: x-ray of the right knee show a constrained TKA in good alignment. No evidence for hardware failure. Some cement lateral to the proximal tibia presumably from extrusion out of the hole from previous cerclage cable.        PHYSICAL EXAM:  GENERAL: Alert and oriented, in no acute distress  MOOD: Appropriate.   MUSCULOSKELETAL: Long leg cast removed today.  Evaluation of the right lower extremity reveals no skin problems from the cast. Skin is warm, dry, intact. Surgical wound is healing well. No signs of infection.  Thigh and calf soft and NTTP. Readily able to wiggle toes. NVI distally.    ASSESSMENT/PLAN: 68 year old female who presents today for cast change now 9 weeks postop from a right knee removal of hardware, revision total knee replacement to a constrained prosthesis, extensor mechanism reconstruction, patellectomy on 05/19/2023.  Cast changed today.  Surgical wound healing well.  Continue WBAT using walker  or cane in a long leg cast.  Continue DVTp.  FU in 3 weeks for re-evaluation and x-rays IN CAST and transition to bledsoe brace after.    We reviewed the likely diagnosis, the prognosis, and various treatment options in detail. Risks and benefits of treatment plan discussed. The patient's questions have been answered, and the patient understands agrees with treatment plan.     I answered all the patient's questions today to the best of  my ability. Patient expressed understanding of an agreement with the plan. Patient will return or report to the ER immediately with any new concerning symptoms for DVT, PE, or infection.     I spent a total of 30 minutes on this visit on the date of service (total time includes all activities performed on the date of service) In that time I performed a complete history and physical exam, reviewed and interpreted patient imaging, coordinated the next steps of the patients care, communicated the plan and diagnosis, placed orders, and preformed appropriate documentation.    Above is my independent interpretation of radiographic studies listed, which were reviewed by me today.      [name], MD      Nursing Communication:    Follow up in 3 weeks with new xrays.

## 2023-07-25 ENCOUNTER — Other Ambulatory Visit (HOSPITAL_BASED_OUTPATIENT_CLINIC_OR_DEPARTMENT_OTHER): Payer: Self-pay | Admitting: Internal Medicine

## 2023-07-25 DIAGNOSIS — N952 Postmenopausal atrophic vaginitis: Secondary | ICD-10-CM

## 2023-07-27 ENCOUNTER — Encounter (HOSPITAL_BASED_OUTPATIENT_CLINIC_OR_DEPARTMENT_OTHER): Payer: Self-pay | Admitting: Registered Nurse

## 2023-07-27 ENCOUNTER — Telehealth (HOSPITAL_BASED_OUTPATIENT_CLINIC_OR_DEPARTMENT_OTHER): Payer: Self-pay | Admitting: Registered Nurse

## 2023-07-27 ENCOUNTER — Ambulatory Visit (INDEPENDENT_AMBULATORY_CARE_PROVIDER_SITE_OTHER): Admitting: Orthopedic Surgery

## 2023-07-27 DIAGNOSIS — M67441 Ganglion, right hand: Secondary | ICD-10-CM

## 2023-07-27 NOTE — Progress Notes (Signed)
   Michelle Roberts - 68 y.o. female MRN [id number]  Date of birth: August 12, 1955  Office Visit Note: Visit Date: 07/27/2023 PCP: Tally Joe, MD Referred by: Tally Joe, MD  Subjective:  HPI: Michelle Roberts is a 68 y.o. female who presents today for follow up 2 weeks status post right finger DIP mucous cyst removal.  She is doing well overall, pain is controlled.  Does have some hypersensitivity at the incisional site.  Pertinent ROS were reviewed with the patient and found to be negative unless otherwise specified above in HPI.   Assessment & Plan: Visit Diagnoses:  1. Digital mucinous cyst of finger of right hand     Plan: She is doing very well postoperatively.  We will leave sutures in for an additional 1 week to allow for ongoing healing.  Dressing changes reviewed in detail today.  She can get the wound wet and pat dry as instructed.  Return in 1 week for suture removal.  Follow-up: No follow-ups on file.   Meds & Orders: No orders of the defined types were placed in this encounter.  No orders of the defined types were placed in this encounter.    Procedures: No procedures performed       Objective:   Vital Signs: There were no vitals taken for this visit.  Ortho Exam Right hand: Ring finger - Well-healing dorsal incision, sutures in place, skin edges well-approximated without erythema or drainage - Range of motion is intact at the DIP 0-45 - Hypersensitivity to touch at the incisional site - Nail plate remains well-fixed beneath the eponychial fold  Imaging: No results found.   Michelle Roberts Trevor Mace, M.D. [facility], Hand Surgery

## 2023-07-27 NOTE — Telephone Encounter (Addendum)
 I called pt back to discuss her concerns,  Of knee pain and her cast,  No answer  message left on VM,    Asking pt to call back with information regarding,    --Why do you feel the cast is not fitted correctly,  is it irritating your skin, too tight in a specific site  --your knee pain,  have you tried pain medication and or elevating the leg to see if that helps    I asked pt to call back with the above information    Message sent to A Leahey PA /Dr Lorel Monaco                 ----- Message from Acie Fredrickson sent at 07/27/2023 11:01 AM EDT -----  Regarding: ESPISITO pt  Contact: [phone]  Pt called last seen 07/20/23 for Status post revision of total replacement of right knee. Pt calling today saying her knee is in severe pain. Pt said he cast is not fitting correctly. Pt wondering if she should come in to get cast changed? Pt booked for 3 week f/u on 08/10/23. Please call and advise

## 2023-07-27 NOTE — Progress Notes (Signed)
 Chart review complete  OUD medication reviewed  BSAS documentation complete and utd  UDS orders updated

## 2023-07-28 ENCOUNTER — Other Ambulatory Visit (HOSPITAL_BASED_OUTPATIENT_CLINIC_OR_DEPARTMENT_OTHER): Payer: Self-pay | Admitting: Internal Medicine

## 2023-07-28 ENCOUNTER — Other Ambulatory Visit (HOSPITAL_BASED_OUTPATIENT_CLINIC_OR_DEPARTMENT_OTHER): Payer: Self-pay | Admitting: Family Medicine

## 2023-07-28 ENCOUNTER — Ambulatory Visit: Payer: No Typology Code available for payment source | Admitting: Family Medicine

## 2023-07-28 DIAGNOSIS — Z96651 Presence of right artificial knee joint: Secondary | ICD-10-CM

## 2023-07-28 MED ORDER — CLONAZEPAM 0.5 MG PO TABS
0.5000 mg | ORAL_TABLET | Freq: Two times a day (BID) | ORAL | 1 refills | Status: DC | PRN
Start: 2023-07-31 — End: 2023-08-18

## 2023-07-28 MED ORDER — OXYCODONE HCL 5 MG PO TABS
5.0000 mg | ORAL_TABLET | Freq: Every day | ORAL | 0 refills | Status: DC | PRN
Start: 2023-07-28 — End: 2023-08-10

## 2023-07-28 NOTE — Telephone Encounter (Signed)
 PER Patient (self), Cheryl Dixon is a 68 year old female has requested a refill of oxycodone and clonazepam     Last prescribed - start date: 16109604 end date: [id number] - oxycodone    Last prescribed - start date: 14782956 end date [id number]    Last Office Visit: [id number]  Last Physical Exam: [id number]      Other Med Adult:  Most Recent BP Reading(s)  06/26/23 : 129/88        Cholesterol (mg/dL)   Date Value   04/30/7251 212     LOW DENSITY LIPOPROTEIN DIRECT (mg/dL)   Date Value   66/44/0347 151     HIGH DENSITY LIPOPROTEIN (mg/dL)   Date Value   42/59/5638 47     TRIGLYCERIDES (mg/dL)   Date Value   75/64/3329 119         THYROID SCREEN TSH REFLEX FT4 (uIU/mL)   Date Value   08/20/2022 4.100         No results found for: "TSH"    HEMOGLOBIN A1C (%)   Date Value   08/20/2022 5.6       No results found for: "POCA1C"      INR (no units)   Date Value   12/12/2021 1.1   02/16/2007 1.0 (L)   07/03/2006 < 1.0 (L)       SODIUM (mmol/L)   Date Value   06/25/2023 140       POTASSIUM (mmol/L)   Date Value   06/25/2023 4.0           CREATININE (mg/dL)   Date Value   51/88/4166 0.8       Documented patient preferred pharmacies:    [id number] - [place], [place] - [address]  Phone: [phone] Fax: [phone]

## 2023-08-04 ENCOUNTER — Other Ambulatory Visit (HOSPITAL_BASED_OUTPATIENT_CLINIC_OR_DEPARTMENT_OTHER): Payer: Self-pay

## 2023-08-04 ENCOUNTER — Ambulatory Visit (INDEPENDENT_AMBULATORY_CARE_PROVIDER_SITE_OTHER): Admitting: Orthopedic Surgery

## 2023-08-04 DIAGNOSIS — Z96651 Presence of right artificial knee joint: Secondary | ICD-10-CM

## 2023-08-04 DIAGNOSIS — M67441 Ganglion, right hand: Secondary | ICD-10-CM

## 2023-08-04 NOTE — Progress Notes (Signed)
   Michelle Roberts - 68 y.o. female MRN [id number]  Date of birth: [birth year]  Office Visit Note: Visit Date: 08/04/2023 PCP: Tally Joe, MD Referred by: Tally Joe, MD  Subjective:  HPI: Michelle Roberts is a 68 y.o. female who presents today for follow up 3 weeks status post right long finger DIP mucous cyst excision with DIP debridement and advancement flap.  Pertinent ROS were reviewed with the patient and found to be negative unless otherwise specified above in HPI.   Assessment & Plan: Visit Diagnoses:  1. Digital mucinous cyst of finger of right hand     Plan: She is doing well overall, sutures removed today.  Sterile dressings applied.  At this juncture, I did explain that she can leave the wound open to air and can allow warm soapy water to run down over the incisional site.  She expressed understanding, return in approxi-1 month for wound check.  We did once again discussed the underlying nature of the mucous cyst and the arthritis within the DIP joint as well as possible recurrence.  Follow-up: No follow-ups on file.   Meds & Orders: No orders of the defined types were placed in this encounter.  No orders of the defined types were placed in this encounter.    Procedures: No procedures performed       Objective:   Vital Signs: There were no vitals taken for this visit.  Ortho Exam Right hand: Ring finger - Well-old dorsal incision, sutures removed, skin edges well-approximated without erythema or drainage - Range of motion is intact at the DIP 0-45 - Hypersensitivity to touch at the incisional site - Nail plate remains well-fixed beneath the eponychial fold  Imaging: No results found.   Cadey Bazile Trevor Mace, M.D. [facility], Hand Surgery

## 2023-08-06 ENCOUNTER — Other Ambulatory Visit (HOSPITAL_BASED_OUTPATIENT_CLINIC_OR_DEPARTMENT_OTHER): Payer: Self-pay | Admitting: Internal Medicine

## 2023-08-06 DIAGNOSIS — R1013 Epigastric pain: Secondary | ICD-10-CM

## 2023-08-06 NOTE — Telephone Encounter (Signed)
 PER Pharmacy, Cheryl Dixon is a 68 year old female has requested a refill of famotidine 20 (discontinued).      Last OFFICE/TELE Visit:  07-07-23 WITH pcp  Last Physical Exam:   05/27/2013     There are no preventive care reminders to display for this patient.    Other Med Adult:  Most Recent BP Reading(s)  06/26/23 : 129/88        Cholesterol (mg/dL)   Date Value   16/01/9603 212     LOW DENSITY LIPOPROTEIN DIRECT (mg/dL)   Date Value   54/12/8117 151     HIGH DENSITY LIPOPROTEIN (mg/dL)   Date Value   14/78/2956 47     TRIGLYCERIDES (mg/dL)   Date Value   21/30/8657 119         THYROID SCREEN TSH REFLEX FT4 (uIU/mL)   Date Value   08/20/2022 4.100         No results found for: "TSH"    HEMOGLOBIN A1C (%)   Date Value   08/20/2022 5.6       No results found for: "POCA1C"      INR (no units)   Date Value   12/12/2021 1.1   02/16/2007 1.0 (L)   07/03/2006 < 1.0 (L)       SODIUM (mmol/L)   Date Value   06/25/2023 140       POTASSIUM (mmol/L)   Date Value   06/25/2023 4.0           CREATININE (mg/dL)   Date Value   84/69/6295 0.8       Documented patient preferred pharmacies:    [id number] - [place], [place] - [address]  Phone: [phone] Fax: [phone]

## 2023-08-10 ENCOUNTER — Other Ambulatory Visit (HOSPITAL_BASED_OUTPATIENT_CLINIC_OR_DEPARTMENT_OTHER): Payer: Self-pay | Admitting: Internal Medicine

## 2023-08-10 ENCOUNTER — Other Ambulatory Visit: Payer: Self-pay

## 2023-08-10 ENCOUNTER — Other Ambulatory Visit (HOSPITAL_BASED_OUTPATIENT_CLINIC_OR_DEPARTMENT_OTHER): Payer: Self-pay | Admitting: Family Medicine

## 2023-08-10 ENCOUNTER — Ambulatory Visit (HOSPITAL_BASED_OUTPATIENT_CLINIC_OR_DEPARTMENT_OTHER): Admitting: Orthopaedic Surgery

## 2023-08-10 ENCOUNTER — Encounter (HOSPITAL_BASED_OUTPATIENT_CLINIC_OR_DEPARTMENT_OTHER): Payer: Self-pay | Admitting: Internal Medicine

## 2023-08-10 ENCOUNTER — Ambulatory Visit
Admission: RE | Admit: 2023-08-10 | Discharge: 2023-08-10 | Disposition: A | Discharge status: Home / Self Care | Attending: Radiology | Admitting: Radiology

## 2023-08-10 DIAGNOSIS — M25561 Pain in right knee: Secondary | ICD-10-CM | POA: Insufficient documentation

## 2023-08-10 DIAGNOSIS — Z96651 Presence of right artificial knee joint: Secondary | ICD-10-CM

## 2023-08-10 DIAGNOSIS — F1120 Opioid dependence, uncomplicated: Secondary | ICD-10-CM

## 2023-08-10 MED ORDER — BUPRENORPHINE HCL-NALOXONE HCL 8-2 MG SL FILM
ORAL_FILM | SUBLINGUAL | 0 refills | Status: DC
Start: 2023-08-10 — End: 2023-09-08

## 2023-08-10 MED ORDER — OXYCODONE HCL 5 MG PO TABS
5.0000 mg | ORAL_TABLET | Freq: Every day | ORAL | 0 refills | Status: AC | PRN
Start: 2023-08-10 — End: 2023-08-17

## 2023-08-10 NOTE — Progress Notes (Signed)
 Orthopedic Office Note    CC: No chief complaint on file.      ORTHOPEDIC PROBLEM LIST:  1. S/p right TKA, DOS: 03/11/2023, Doherty  2. Open right patella tendon rupture (grade 3), DOI: 03/26/2023, s/p poly exchange, irrigation/wound debridement, patellar tendon repair with cerclage cable, DOS: 03/29/2023, [name]  3. S/p revision right TKA to constrained prosthesis, extensor mechanism reconstruction, patellectomy and ROH, DOS: 05/19/23, [name]    HPI: Cheryl Dixon is a 68 year old female who presents today for postop evaluation for her right knee. She is 12 weeks post op.     To review: Patient has had a complicated postop course regarding her right knee. She was initially a patient of Dr. Frieda Jew who underwent a primary total knee replacement without complication on 03/11/2023. Unfortunately, she sustained a mechanical fall on 03/26/2023; she was sitting in a chair that was lower than she had expected; felt a slight popping sensation and immediately had pain, bleeding. She was found to have a 2.5 cm inferior wound dehiscence as well as evidence for a patellar tendon rupture. She was placed into a knee immobilizer. She subsequently had a new injury on 03/28/2023 where her right leg gave out while wearing the knee immobilizer causing increased bleeding. She was BIBA to the ED and admitted to the medical service in anticipation of surgical intervention. On exam she was found to have a grade 3 open dehiscence of a right total knee arthroplasty wound and subsequently underwent an open right patellar tendon rupture repair, irrigation and excisional debridement, antibiotic bead placement, extensor tendon tendon reinforcement with cerclage cable, poly exchange and closure. She followed up with Dr. Quinn Bucco office on 04/14/2023 and x-ray/clinical evaluation showed evidence for failure of the patellar tendon repair with patella alta and displacement of the cable. She had an infectious work up with Dr. [name]  that was negative; she underwent surgery on 05/19/23 and discharged home the next day after she was put into a long leg cast on the floor with a window for her Wound Vac.    Cast was modified 06/29/2023. No new injuries or traumas. Pain is controlled with Tylenol and celecoxib PRN.       No new symptoms. Denies any numbness or tingling distally. No calf pain, chest pain or SOB.     No local/systemic symptoms/signs of infection.    She has been WBAT using a walker or cane and mobilizes with wheelchair also.    Taking ASA 81 mg PO BID for DVTp.    IMAGING: x-ray of the right knee show a constrained TKA in good alignment. No evidence for hardware failure. Some cement lateral to the proximal tibia presumably from extrusion out of the hole from previous cerclage cable.        PHYSICAL EXAM:  GENERAL: Alert and oriented, in no acute distress  MOOD: Appropriate.   MUSCULOSKELETAL: Long leg cast removed today.  Evaluation of the right lower extremity reveals no skin problems from the cast. Skin is warm, dry, intact. Surgical wound is healing well. No signs of infection.  Thigh and calf soft and NTTP. Readily able to wiggle toes. NVI distally.    ASSESSMENT/PLAN: 68 year old female who presents today for cast change now 12 weeks postop from a right knee removal of hardware, revision total knee replacement to a constrained prosthesis, extensor mechanism reconstruction, patellectomy on 05/19/2023.  Cast removed today.  Surgical wound healing well.  Transition to a bledsoe brace locked in full extension.  Continue WBAT using walker or cane in locked bledsoe brace in full extension.  Patient prefers home PT.  Active gravity flexion in brace to 45 degrees only for the first month, then 60 degrees for the second month and then 90 degrees for the third month.  Total time in brace is 3 months then wean out.    FU in 4 weeks for re-evaluation and x-rays.    We reviewed the likely diagnosis, the prognosis, and various treatment options in  detail. Risks and benefits of treatment plan discussed. The patient's questions have been answered, and the patient understands agrees with treatment plan.     I answered all the patient's questions today to the best of my ability. Patient expressed understanding of an agreement with the plan. Patient will return or report to the ER immediately with any new concerning symptoms for DVT, PE, or infection.     I spent a total of 30 minutes on this visit on the date of service (total time includes all activities performed on the date of service) In that time I performed a complete history and physical exam, reviewed and interpreted patient imaging, coordinated the next steps of the patients care, communicated the plan and diagnosis, placed orders, and preformed appropriate documentation.    Above is my independent interpretation of radiographic studies listed, which were reviewed by me today.      Jaclynn Mast, MD      Nursing Communication:    Follow up in 4 weeks with new xrays.

## 2023-08-10 NOTE — Telephone Encounter (Signed)
 PER Patient (self),NAME@ is a 68 year old female has requested a refill of oxycodone       Last prescribed - start date: 07/28/2023 end date: 08/04/23     Last Office Visit: 07/07/23    There are no preventive care reminders to display for this patient.     Other Med Adult:  Most Recent BP Reading(s)  06/26/23 : 129/88        Cholesterol (mg/dL)   Date Value   42/59/5638 212     LOW DENSITY LIPOPROTEIN DIRECT (mg/dL)   Date Value   75/64/3329 151     HIGH DENSITY LIPOPROTEIN (mg/dL)   Date Value   51/88/4166 47     TRIGLYCERIDES (mg/dL)   Date Value   10/26/1599 119         THYROID SCREEN TSH REFLEX FT4 (uIU/mL)   Date Value   08/20/2022 4.100         No results found for: "TSH"    HEMOGLOBIN A1C (%)   Date Value   08/20/2022 5.6       No results found for: "POCA1C"      INR (no units)   Date Value   12/12/2021 1.1   02/16/2007 1.0 (L)   07/03/2006 < 1.0 (L)       SODIUM (mmol/L)   Date Value   06/25/2023 140       POTASSIUM (mmol/L)   Date Value   06/25/2023 4.0           CREATININE (mg/dL)   Date Value   09/32/3557 0.8        Documented patient preferred pharmacies:    [id number] - [place], [place] - [address]  Phone: [phone] Fax: [phone]

## 2023-08-11 ENCOUNTER — Encounter (HOSPITAL_BASED_OUTPATIENT_CLINIC_OR_DEPARTMENT_OTHER): Payer: Self-pay | Admitting: Registered Nurse

## 2023-08-11 ENCOUNTER — Ambulatory Visit: Admitting: Registered Nurse

## 2023-08-11 NOTE — Progress Notes (Signed)
 Patient has been present at all scheduled online google groups for suboxone.  Participates in group discussion.  Denies any cravings or close calls.  Stable on current dose of suboxone.  Otherwise well.  Denies any other c/o.  Chart review complete.    Pt states she is finally out of her cast and in a leg brace.    Feels like she has a lot more freedom to move finally.  States she is doing much better.

## 2023-08-14 ENCOUNTER — Other Ambulatory Visit (HOSPITAL_BASED_OUTPATIENT_CLINIC_OR_DEPARTMENT_OTHER): Payer: Self-pay | Admitting: Internal Medicine

## 2023-08-14 DIAGNOSIS — Z96651 Presence of right artificial knee joint: Secondary | ICD-10-CM

## 2023-08-14 NOTE — Telephone Encounter (Signed)
 PER Pharmacy, Cheryl Dixon is a 68 year old female has requested a refill of      -  oxyCODONE    Start Date: 08/10/23 End Date: 08/17/23        Last Office Visit: 08/11/23 with osorio   Last Physical Exam: 05/27/13      There are no preventive care reminders to display for this patient.     Other Med Adult:  Most Recent BP Reading(s)  06/26/23 : 129/88        Cholesterol (mg/dL)   Date Value   16/01/9603 212     LOW DENSITY LIPOPROTEIN DIRECT (mg/dL)   Date Value   54/12/8117 151     HIGH DENSITY LIPOPROTEIN (mg/dL)   Date Value   14/78/2956 47     TRIGLYCERIDES (mg/dL)   Date Value   21/30/8657 119         THYROID  SCREEN TSH REFLEX FT4 (uIU/mL)   Date Value   08/20/2022 4.100         No results found for: "TSH"    HEMOGLOBIN A1C (%)   Date Value   08/20/2022 5.6       No results found for: "POCA1C"      INR (no units)   Date Value   12/12/2021 1.1   02/16/2007 1.0 (L)   07/03/2006 < 1.0 (L)       SODIUM (mmol/L)   Date Value   06/25/2023 140       POTASSIUM (mmol/L)   Date Value   06/25/2023 4.0           CREATININE (mg/dL)   Date Value   84/69/6295 0.8        Documented patient preferred pharmacies:    [id number] [place], [place] - [address]  Phone: [phone] Fax: [phone]

## 2023-08-18 ENCOUNTER — Other Ambulatory Visit (HOSPITAL_BASED_OUTPATIENT_CLINIC_OR_DEPARTMENT_OTHER): Payer: Self-pay | Admitting: Internal Medicine

## 2023-08-18 ENCOUNTER — Other Ambulatory Visit (HOSPITAL_BASED_OUTPATIENT_CLINIC_OR_DEPARTMENT_OTHER): Payer: Self-pay | Admitting: Physician Assistant

## 2023-08-18 DIAGNOSIS — N3281 Overactive bladder: Secondary | ICD-10-CM

## 2023-08-18 DIAGNOSIS — N952 Postmenopausal atrophic vaginitis: Secondary | ICD-10-CM

## 2023-08-18 DIAGNOSIS — Z96651 Presence of right artificial knee joint: Secondary | ICD-10-CM

## 2023-08-18 MED ORDER — CLONAZEPAM 0.5 MG PO TABS
0.5000 mg | ORAL_TABLET | Freq: Two times a day (BID) | ORAL | 1 refills | Status: DC | PRN
Start: 2023-08-18 — End: 2023-08-26

## 2023-08-18 NOTE — Telephone Encounter (Signed)
 PER Patient (self),NAME@ is a 68 year old female has requested a refill of   Klonopin  start date: 07/31/23 end date: 08/14/23  oxycodone       Last prescribed - start date: 08/10/23 end date: 08/17/23     Last OFFICE Visit: 08/11/23 with osorio,k     Last TELE Visit: Recent Visits  Date Type Provider Dept   07/07/23 Anthon Baston, MD [facility]   06/24/23 Televisit Rocco Christians, MD [facility]   06/16/23 Byrd Cast, MD [facility]   06/02/23 Byrd Cast, MD [facility]   05/05/23 Byrd Cast, MD [facility]   03/10/23 Televisit Marcelyn Servant, MD [facility]   02/10/23 Televisit Marcelyn Servant, MD [facility]   01/13/23 Televisit Marcelyn Servant, MD [facility]   11/20/22 Televisit Love, Adina Ahle [facility]   11/18/22 Televisit Marcelyn Servant, MD [facility]   Showing recent visits within past 365 days with a meds authorizing provider and meeting all other requirements  Future Appointments  No visits were found meeting these conditions.  Showing future appointments within next 0 days with a meds authorizing provider and meeting all other requirements       Last Physical Exam: 05/27/2013        There are no preventive care reminders to display for this patient.     Other Med Adult:  Most Recent BP Reading(s)  06/26/23 : 129/88        Cholesterol (mg/dL)   Date Value   98/02/9146 212     LOW DENSITY LIPOPROTEIN DIRECT (mg/dL)   Date Value   82/95/6213 151     HIGH DENSITY LIPOPROTEIN (mg/dL)   Date Value   08/65/7846 47     TRIGLYCERIDES (mg/dL)   Date Value   96/29/5284 119         THYROID  SCREEN TSH REFLEX FT4 (uIU/mL)   Date Value   08/20/2022 4.100         No results found for: "TSH"    HEMOGLOBIN A1C (%)   Date Value   08/20/2022 5.6       No results found for: "POCA1C"      INR (no units)   Date Value   12/12/2021 1.1   02/16/2007 1.0 (L)   07/03/2006 < 1.0 (L)       SODIUM (mmol/L)   Date Value   06/25/2023 140       POTASSIUM  (mmol/L)   Date Value   06/25/2023 4.0           CREATININE (mg/dL)   Date Value   13/24/4010 0.8        Documented patient preferred pharmacies:    [id number] - [place], [place] - [address]  Phone: [phone] Fax: [phone]

## 2023-08-19 NOTE — Telephone Encounter (Signed)
 PER Pharmacy, Cheryl Dixon is a 68 year old female has requested a refill of estrace , vitamin d       Last OFFICE/TELE Visit:  07/07/23 with pcp      Last Physical Exam:   05/27/2013     There are no preventive care reminders to display for this patient.    Other Med Adult:  Most Recent BP Reading(s)  06/26/23 : 129/88        Cholesterol (mg/dL)   Date Value   16/01/9603 212     LOW DENSITY LIPOPROTEIN DIRECT (mg/dL)   Date Value   54/12/8117 151     HIGH DENSITY LIPOPROTEIN (mg/dL)   Date Value   14/78/2956 47     TRIGLYCERIDES (mg/dL)   Date Value   21/30/8657 119         THYROID  SCREEN TSH REFLEX FT4 (uIU/mL)   Date Value   08/20/2022 4.100         No results found for: "TSH"    HEMOGLOBIN A1C (%)   Date Value   08/20/2022 5.6       No results found for: "POCA1C"      INR (no units)   Date Value   12/12/2021 1.1   02/16/2007 1.0 (L)   07/03/2006 < 1.0 (L)       SODIUM (mmol/L)   Date Value   06/25/2023 140       POTASSIUM (mmol/L)   Date Value   06/25/2023 4.0           CREATININE (mg/dL)   Date Value   84/69/6295 0.8       Documented patient preferred pharmacies:    [id number] - [place], [place] - [address]  Phone: [phone] Fax: [phone]

## 2023-08-19 NOTE — Telephone Encounter (Signed)
 PER Pharmacy, Cheryl Dixon is a 68 year old female has requested a refill of hydroxyzine .      Last OFFICE/TELE Visit:  07/07/23 with pcp      Last Physical Exam:   05/27/2013     There are no preventive care reminders to display for this patient.    Other Med Adult:  Most Recent BP Reading(s)  06/26/23 : 129/88        Cholesterol (mg/dL)   Date Value   98/02/9146 212     LOW DENSITY LIPOPROTEIN DIRECT (mg/dL)   Date Value   82/95/6213 151     HIGH DENSITY LIPOPROTEIN (mg/dL)   Date Value   08/65/7846 47     TRIGLYCERIDES (mg/dL)   Date Value   96/29/5284 119         THYROID  SCREEN TSH REFLEX FT4 (uIU/mL)   Date Value   08/20/2022 4.100         No results found for: "TSH"    HEMOGLOBIN A1C (%)   Date Value   08/20/2022 5.6       No results found for: "POCA1C"      INR (no units)   Date Value   12/12/2021 1.1   02/16/2007 1.0 (L)   07/03/2006 < 1.0 (L)       SODIUM (mmol/L)   Date Value   06/25/2023 140       POTASSIUM (mmol/L)   Date Value   06/25/2023 4.0           CREATININE (mg/dL)   Date Value   13/24/4010 0.8       Documented patient preferred pharmacies:    [id number] - [place], [place] - [address]  Phone: [phone] Fax: [phone]

## 2023-08-20 ENCOUNTER — Other Ambulatory Visit: Payer: Self-pay

## 2023-08-23 IMAGING — CT CT PELVIS W/ CM
2 of 3 series · 11 of 46 positions shown, 12 images · IV contrast (agent unspecified)
Comparison: None Available.

CLINICAL DATA: Progressively worsening rectal pain over the past 3
weeks.

EXAM:
CT PELVIS WITH CONTRAST
TECHNIQUE: Multidetector CT imaging of the pelvis was performed using the
standard protocol following the bolus administration of intravenous
contrast.

[Series 2: pelvis 5.00 br40 s3 · axial · 0.62mm/px · z∈[+1026,+1266]mm · 8 of 56 slices shown, 9 images]
[im 4/56  soft-tissue]
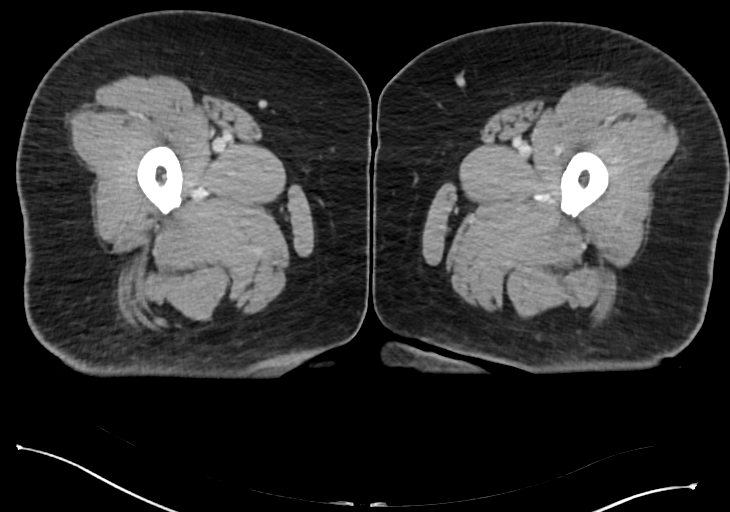
[im 4/56  bone]
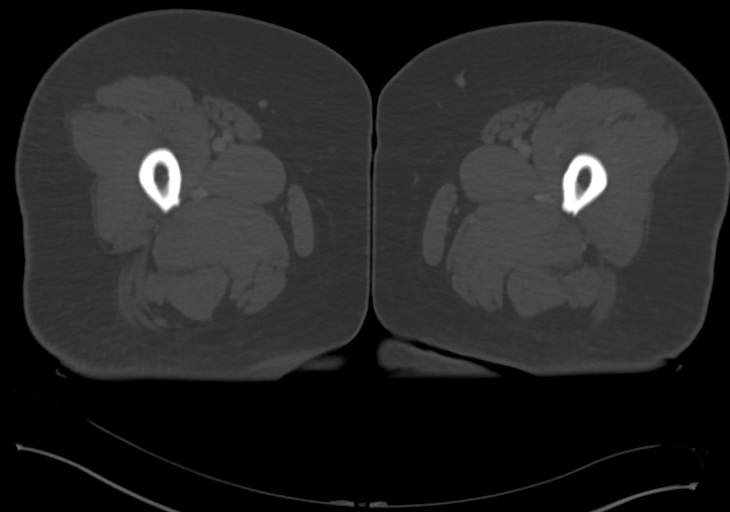
[im 11/56  soft-tissue]
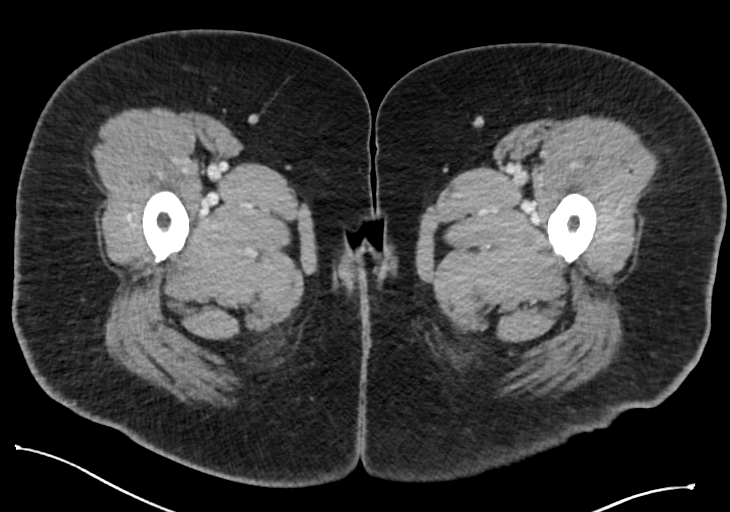
[im 18/56  soft-tissue]
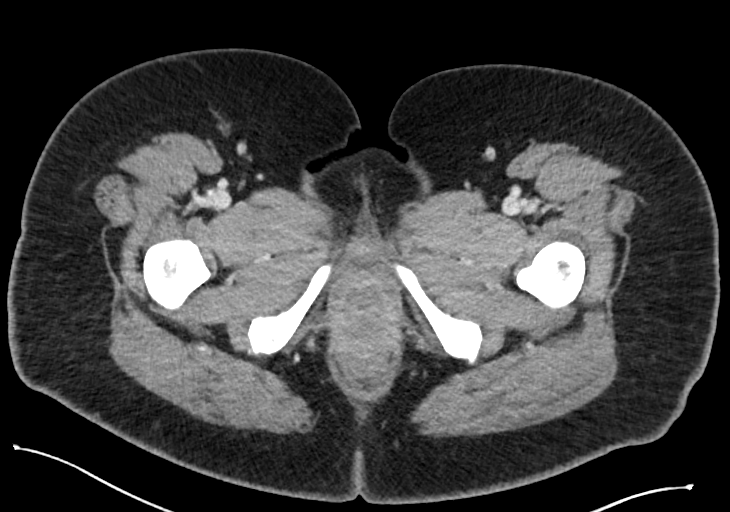
[im 25/56  soft-tissue]
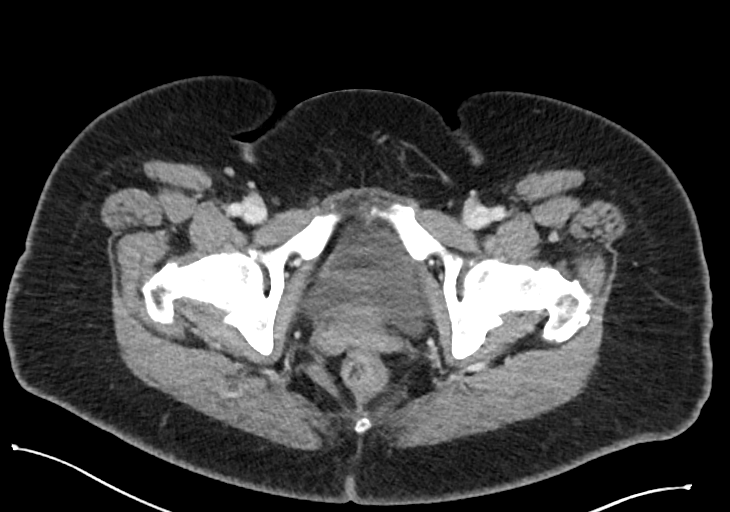
[im 31/56  soft-tissue]
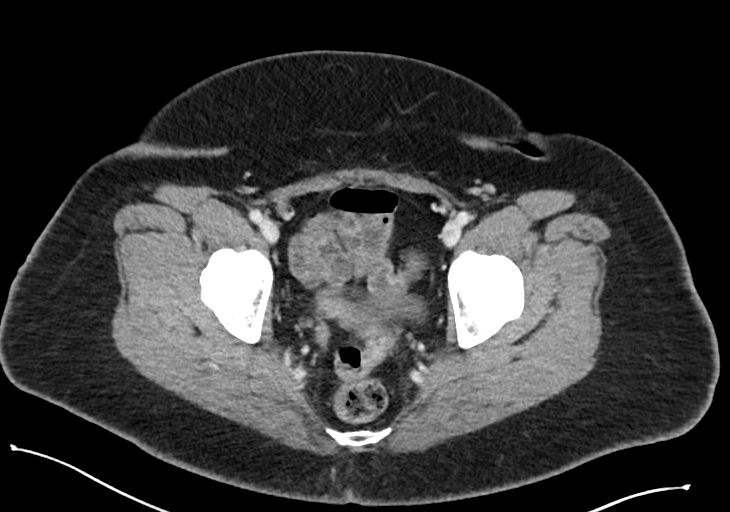
[im 38/56  soft-tissue]
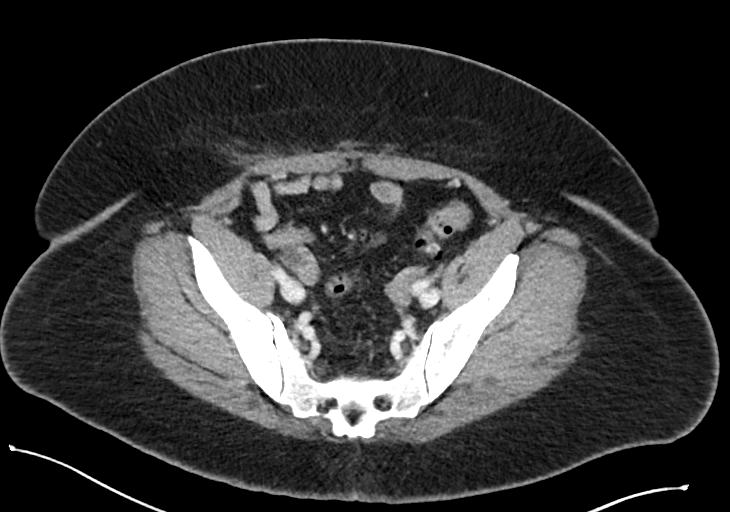
[im 45/56  soft-tissue]
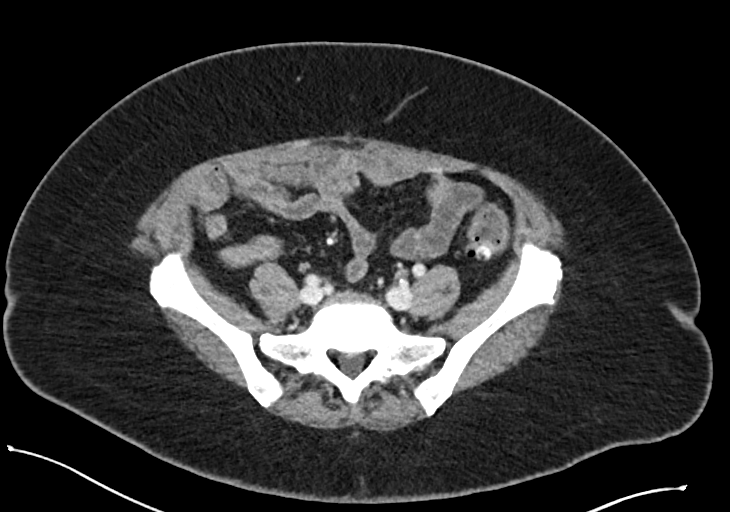
[im 52/56  soft-tissue]
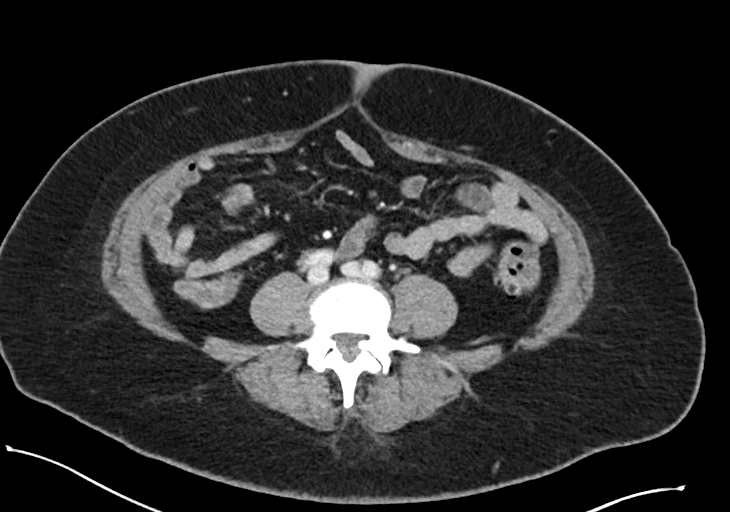

[Series 4: pelvis 2.00 br40 s3 cor · coronal · 0.55mm/px · 3 of 160 slices shown]
[im 54/160  soft-tissue]
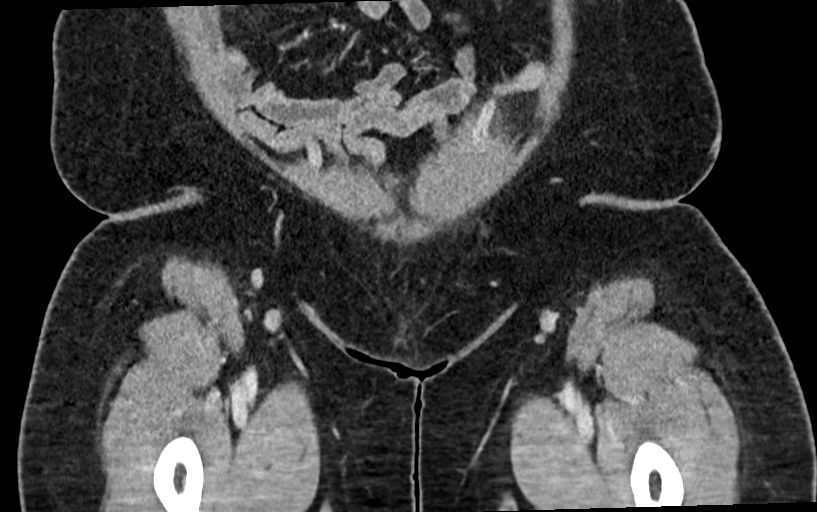
[im 71/160  soft-tissue]
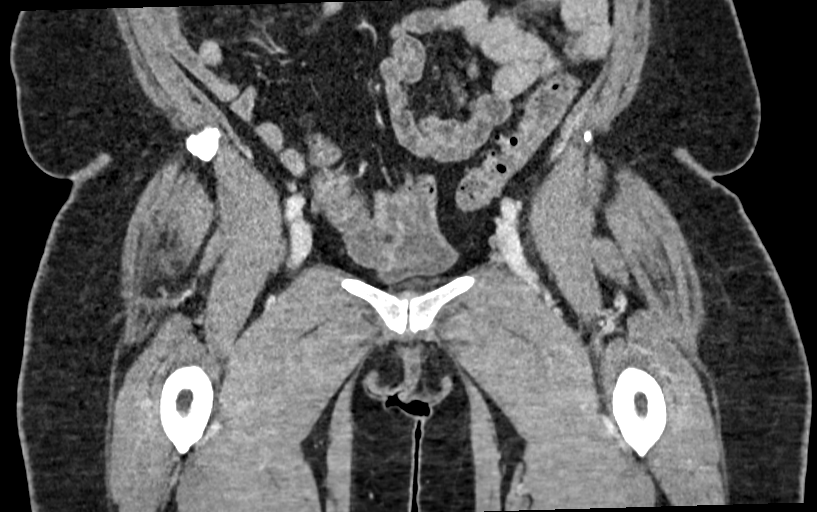
[im 89/160  soft-tissue]
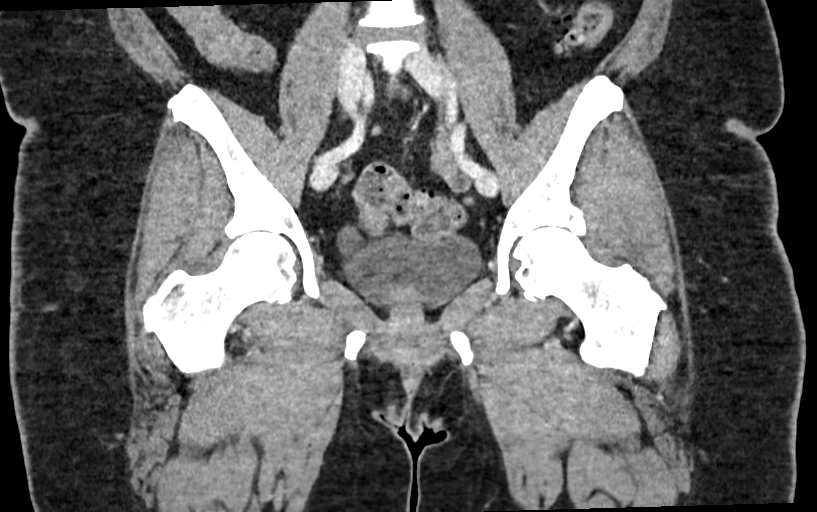

[11 of 46 positions shown; findings below may reference images not displayed]

Creatinine was obtained on site at [HOSPITAL] at [REDACTED].

Results: Creatinine 0.8 mg/dL.

RADIATION DOSE REDUCTION: This exam was performed according to the
departmental dose-optimization program which includes automated
exposure control, adjustment of the mA and/or kV according to
patient size and/or use of iterative reconstruction technique.

CONTRAST:  100mL QTDH0F-GDD IOPAMIDOL (QTDH0F-GDD) INJECTION 61%
FINDINGS: Urinary Tract:  No abnormality visualized.

Bowel: Grossly unremarkable rectum. Sigmoid colonic diverticulosis.
Visualized small bowel is unremarkable. Normal appendix.

Vascular/Lymphatic: No pathologically enlarged lymph nodes. No
significant vascular abnormality seen.

Reproductive: Prior hysterectomy. Normal ovaries. No adnexal mass.

Other:  None.  No free fluid in the pelvis.  Normal presacral fat.

Musculoskeletal: No acute or significant osseous findings. Mild
bilateral hip and sacroiliac joint osteoarthritis. Advanced lower
lumbar facet arthropathy.
IMPRESSION: 1. No acute intrapelvic process.  Grossly unremarkable rectum.

## 2023-08-25 ENCOUNTER — Ambulatory Visit: Attending: Internal Medicine | Admitting: Physician Assistant

## 2023-08-25 ENCOUNTER — Other Ambulatory Visit: Payer: Self-pay

## 2023-08-25 ENCOUNTER — Encounter (HOSPITAL_BASED_OUTPATIENT_CLINIC_OR_DEPARTMENT_OTHER): Payer: Self-pay | Admitting: Physician Assistant

## 2023-08-25 ENCOUNTER — Ambulatory Visit: Payer: No Typology Code available for payment source | Admitting: Family Medicine

## 2023-08-25 VITALS — BP 144/84 | HR 62 | Temp 97.9°F | Wt 195.0 lb

## 2023-08-25 DIAGNOSIS — I6330 Cerebral infarction due to thrombosis of unspecified cerebral artery: Secondary | ICD-10-CM | POA: Insufficient documentation

## 2023-08-25 DIAGNOSIS — M7989 Other specified soft tissue disorders: Secondary | ICD-10-CM | POA: Insufficient documentation

## 2023-08-25 DIAGNOSIS — M25511 Pain in right shoulder: Secondary | ICD-10-CM | POA: Diagnosis not present

## 2023-08-25 DIAGNOSIS — Z96651 Presence of right artificial knee joint: Secondary | ICD-10-CM | POA: Diagnosis present

## 2023-08-25 DIAGNOSIS — M810 Age-related osteoporosis without current pathological fracture: Secondary | ICD-10-CM | POA: Diagnosis present

## 2023-08-25 DIAGNOSIS — M25512 Pain in left shoulder: Secondary | ICD-10-CM | POA: Insufficient documentation

## 2023-08-25 DIAGNOSIS — Z1231 Encounter for screening mammogram for malignant neoplasm of breast: Secondary | ICD-10-CM | POA: Insufficient documentation

## 2023-08-25 DIAGNOSIS — R0789 Other chest pain: Secondary | ICD-10-CM | POA: Diagnosis present

## 2023-08-25 DIAGNOSIS — G8929 Other chronic pain: Secondary | ICD-10-CM | POA: Insufficient documentation

## 2023-08-25 DIAGNOSIS — G245 Blepharospasm: Secondary | ICD-10-CM | POA: Diagnosis present

## 2023-08-25 LAB — VITAMIN D,25 HYDROXY: VITAMIN D,25 HYDROXY: 24 ng/mL — ABNORMAL LOW (ref 30.0–100.0)

## 2023-08-25 MED ORDER — MAGNESIUM OXIDE -MG SUPPLEMENT 400 (240 MG) MG PO TABS: 400.0000 mg | ORAL_TABLET | Freq: Every day | ORAL | 3 refills | Status: DC

## 2023-08-25 MED ORDER — CELECOXIB 200 MG PO CAPS
200.0000 mg | ORAL_CAPSULE | Freq: Every day | ORAL | 0 refills | Status: DC
Start: 2023-08-25 — End: 2023-09-25

## 2023-08-25 MED ORDER — ACETAMINOPHEN 500 MG PO TABS
500.0000 mg | ORAL_TABLET | Freq: Three times a day (TID) | ORAL | 0 refills | Status: DC | PRN
Start: 2023-08-25 — End: 2024-01-27

## 2023-08-25 NOTE — Progress Notes (Signed)
 Cheryl Dixon is a 68 year old female pt of Dr. Trent Frizzle, MD who presents for leg swelling      Leg swelling  B/l in the last couple of weeks  Several months ago had right TKA complicated by patellar tendon rupture necessitating repeat surgery  Had cast on for 12 weeks  Seen by Ortho 2 weeks ago and had cast removed and was placed in the brace  Has been up a bit more and more active  Swelling improves with elevation  When she wakes in the morning there is no swelling but notices after being up for an hour or so  No calf pains        Shoulder pain  Bilateral shoulder pain  Chronic but has been worse recently, especially left side  Uses her cane on the left side and has noted has been walking         Chest pain  X 2 months  Center/left chest  Lasts a few seconds and resolves  Happens maybe 3 times weekly        Twitching  L eye  8-12 months  Seems to be getting worse  Noticing a little twitching of her cheek as well  No other twitching in her body   No facial weakness  No pain      BP (!) 144/84   Pulse 62   Temp 97.9 F (36.6 C) (Temporal)   Wt 88.5 kg (195 lb)   LMP 11/20/1991   SpO2 97%   BMI 36.84 kg/m   Pain Score: Data Unavailable            Physical Exam  Vitals reviewed.   Constitutional:       General: She is not in acute distress.     Appearance: Normal appearance.   Eyes:      Extraocular Movements: Extraocular movements intact.      Pupils: Pupils are equal, round, and reactive to light.   Neurological:      Mental Status: She is alert.      Comments: Spasm of left periorbital muscles  CN II through XII grossly intact           ASSESSMENT/PLAN        (M79.89) Leg swelling  (primary encounter diagnosis)  (Z61.096) S/P revision of total knee, right  Comment: Patient presents for evaluation of bilateral leg swelling for the last few weeks in the setting of knee surgery several months ago with recent cast removal and placement of a brace.  Suspect this is dependent edema given she reports improvement  with leg elevation, likely related to varicose veins noted on exam as well as continued recovery from surgery.  DVT less likely given improvement with elevation, no calf pains, no pitting edema.  Plan: celecoxib  (CELEBREX ) 200 MG capsule,         acetaminophen  (TYLENOL ) 500 MG tablet        Continue above medications for right knee pain        Keep legs elevated when seated        Follow-up if any new or worsening symptoms      (M25.511,  G89.29,  M25.512) Chronic pain of both shoulders  Comment: Reports chronic pain in shoulders, worsening recently.  This is in the setting of removal of cast from right leg and increased ambulation.  Left shoulder is especially painful and this is the side she uses her cane so likely some pain is related to increased  mobility.  She has a follow-up with orthopedics in 2 weeks and plans to address shoulder pain with them at that time  Plan: Patient will contact Ortho to advise them of shoulder pain and see if she can follow-up on this at next appointment      (R07.89) Other chest pain  Comment: Reports intermittent pain for the last few months occurring on center/left chest.  Suspect muscular pains as it lasts only a few seconds and resolves and only occurs 3 times per week.  Atypical for angina given very short duration pains.  Plan: Monitor for now, follow-up if worsening pain, would consider further cardiac workup      (G24.5) Eye twitch  Comment: Per patient ongoing for up to a year.  Has been worsening and now starting to involve cheek as well as area around eye.  Suspect this could be hemifacial spasm given persistent symptoms around which is now spreading to other parts of face.  No pains to suggest headache syndromes such as trigeminal neuralgia.  No evidence of cranial nerve deficits.  Referred to neurology to further assess.  Also prescribed magnesium  which patient reports she has been taking and found helpful  Plan: REFERRAL TO NEUROLOGY         (INT), magnesium  oxide  (MAG-OX) 400 (240 Mg) MG        tablet            (M81.0) Osteoporosis  Comment: Prescribed vitamin D .  She reports having some nausea since she has not been taking it.  Check labs today  Plan: VITAMIN D ,25 HYDROXY            Consider trial of a combined calcium  and vitamin D  supplement      (Z12.31) Encounter for screening mammogram for malignant neoplasm of breast  Plan: [place] SCREENING MAMMO BILATERAL DIGITAL WITH DBT &        CAD            (I63.30) Cerebrovascular accident (CVA) due to thrombosis of cerebral artery (HCC)  Comment: History of stroke over a year ago.  Added aspirin  back to med list as it had fallen off  Plan: aspirin  (ASPIRIN  81) 81 MG chewable tablet              I spent a total of 55 minutes on this visit on the date of service (total time includes all activities performed on the date of service)

## 2023-08-26 ENCOUNTER — Other Ambulatory Visit (HOSPITAL_BASED_OUTPATIENT_CLINIC_OR_DEPARTMENT_OTHER): Payer: Self-pay | Admitting: Internal Medicine

## 2023-08-26 MED ORDER — CLONAZEPAM 0.5 MG PO TABS
0.5000 mg | ORAL_TABLET | Freq: Two times a day (BID) | ORAL | 1 refills | Status: DC | PRN
Start: 2023-09-01 — End: 2023-09-17

## 2023-08-26 NOTE — Telephone Encounter (Signed)
 PER Patient (self),NAME@ is a 68 year old female has requested a refill of klonopin       Last prescribed - start date: 08/18/23 end date: 09/01/23     Last OFFICE Visit: 08/25/23 with [name]     Last TELE Visit: Recent Visits  Date Type Provider Dept   07/07/23 Anthon Baston, MD [facility]   06/24/23 Televisit Rocco Christians, MD [facility]   06/16/23 Byrd Cast, MD [facility]   06/02/23 Byrd Cast, MD [facility]   05/05/23 Byrd Cast, MD [facility]   03/10/23 Televisit Marcelyn Servant, MD [facility]   02/10/23 Televisit Marcelyn Servant, MD [facility]   01/13/23 Televisit Marcelyn Servant, MD [facility]   11/20/22 Televisit Love, Adina Ahle [facility]   11/18/22 Televisit Marcelyn Servant, MD [facility]   Showing recent visits within past 365 days with a meds authorizing provider and meeting all other requirements  Future Appointments  No visits were found meeting these conditions.  Showing future appointments within next 0 days with a meds authorizing provider and meeting all other requirements       Last Physical Exam: 05/27/2013        There are no preventive care reminders to display for this patient.     Other Med Adult:  Most Recent BP Reading(s)  08/25/23 : (!) 144/84        Cholesterol (mg/dL)   Date Value   52/77/8242 212     LOW DENSITY LIPOPROTEIN DIRECT (mg/dL)   Date Value   35/36/1443 151     HIGH DENSITY LIPOPROTEIN (mg/dL)   Date Value   15/40/0867 47     TRIGLYCERIDES (mg/dL)   Date Value   61/95/0932 119         THYROID  SCREEN TSH REFLEX FT4 (uIU/mL)   Date Value   08/20/2022 4.100         No results found for: "TSH"    HEMOGLOBIN A1C (%)   Date Value   08/20/2022 5.6       No results found for: "POCA1C"      INR (no units)   Date Value   12/12/2021 1.1   02/16/2007 1.0 (L)   07/03/2006 < 1.0 (L)       SODIUM (mmol/L)   Date Value   06/25/2023 140       POTASSIUM (mmol/L)   Date Value   06/25/2023 4.0            CREATININE (mg/dL)   Date Value   67/03/4579 0.8        Documented patient preferred pharmacies:    [id number] - [place], [place] - [address]  Phone: [phone] Fax: [phone]

## 2023-08-28 ENCOUNTER — Encounter (HOSPITAL_BASED_OUTPATIENT_CLINIC_OR_DEPARTMENT_OTHER): Payer: Self-pay | Admitting: Physician Assistant

## 2023-08-28 ENCOUNTER — Encounter (HOSPITAL_BASED_OUTPATIENT_CLINIC_OR_DEPARTMENT_OTHER): Payer: Self-pay

## 2023-08-28 MED ORDER — CALCIUM CARB-CHOLECALCIFEROL 500-10 MG-MCG PO TABS
1.0000 | ORAL_TABLET | Freq: Two times a day (BID) | ORAL | 3 refills | Status: AC
Start: 2023-08-28 — End: 2024-08-27

## 2023-08-31 ENCOUNTER — Other Ambulatory Visit (HOSPITAL_BASED_OUTPATIENT_CLINIC_OR_DEPARTMENT_OTHER): Payer: Self-pay

## 2023-08-31 DIAGNOSIS — R52 Pain, unspecified: Secondary | ICD-10-CM

## 2023-09-01 ENCOUNTER — Other Ambulatory Visit (HOSPITAL_BASED_OUTPATIENT_CLINIC_OR_DEPARTMENT_OTHER): Payer: Self-pay

## 2023-09-01 ENCOUNTER — Telehealth (HOSPITAL_BASED_OUTPATIENT_CLINIC_OR_DEPARTMENT_OTHER): Payer: Self-pay | Admitting: Orthopaedic Surgery

## 2023-09-01 ENCOUNTER — Telehealth (HOSPITAL_BASED_OUTPATIENT_CLINIC_OR_DEPARTMENT_OTHER): Payer: Self-pay

## 2023-09-01 DIAGNOSIS — M175 Other unilateral secondary osteoarthritis of knee: Secondary | ICD-10-CM

## 2023-09-01 DIAGNOSIS — R2681 Unsteadiness on feet: Secondary | ICD-10-CM

## 2023-09-01 MED ORDER — OTHER MEDICATION
0 refills | Status: AC
Start: 2023-09-01 — End: 2024-08-31

## 2023-09-01 NOTE — Telephone Encounter (Signed)
-----   Message from Sallyanne Creamer sent at 09/01/2023  9:57 AM EDT -----  Regarding: ESPISITO pt  Contact: [phone]  Loyce Ruffini pt daughter calling for pt, calling to see if pt can get x-rays on both shoulders during 09/07/23 f/u w/ ESPISITO. f/u however is for Status post revision of total replacement of right knee.Loyce Ruffini trying to expedite the process because pt is in so much pain. Looking for x-rays for Both shoulders, relief cream and tylenol  not giving any relief.  Please call and advise

## 2023-09-01 NOTE — Telephone Encounter (Addendum)
 Message sent to scheduling center to schedule patient with appropriate physician for Shoulder pain.

## 2023-09-01 NOTE — Telephone Encounter (Addendum)
 Pt  called requesting script for a shower chair be sent in for her- she isn't sure where she would get this filled, is req cb to discuss her options.

## 2023-09-01 NOTE — Telephone Encounter (Signed)
Hello,    [facility] received a request for [facility]. In order to process this request we will first need an RX. I have pended an INCOMPLETE RX below. Please review, complete and approve if appropriate. Please be sure to include DX and ICD 10      In addition detailed chart notes discussing the medical need for the supply would be needed.      Please review.      Thank you

## 2023-09-01 NOTE — Addendum Note (Signed)
 Addended by: Cade Dashner on: 09/01/2023 05:11 PM     Modules accepted: Orders

## 2023-09-01 NOTE — Telephone Encounter (Signed)
 Patient reached and offered an appointment with Dr Wadell Guild for Bilateral shoulder pain,but patient declined. Patient stated all she needs is for MD to put in an order for xray so that she can have the xrays when she sees Dr [name] for her knee. Patient was told she would need a consult first with Dr Wadell Guild then it would be up to Dr Wadell Guild to order the xray. Patient declined. Patient state she will speak with Dr [name] on 5/12 about her shoulder pain.

## 2023-09-01 NOTE — Addendum Note (Signed)
 Addended by: Almer Jacobson on: 09/01/2023 02:55 PM     Modules accepted: Orders

## 2023-09-02 ENCOUNTER — Ambulatory Visit (INDEPENDENT_AMBULATORY_CARE_PROVIDER_SITE_OTHER): Admitting: Orthopedic Surgery

## 2023-09-02 DIAGNOSIS — M67441 Ganglion, right hand: Secondary | ICD-10-CM

## 2023-09-02 NOTE — Progress Notes (Signed)
   Nikera Furer - 68 y.o. female MRN [id number]  Date of birth: 1956-03-09  Office Visit Note: Visit Date: 09/02/2023 PCP: Rae Bugler, MD Referred by: Rae Bugler, MD  Subjective:  HPI: Lateka Teodosio is a 68 y.o. female who presents today for follow up 8 weeks status post right long finger DIP mucous cyst debridement.  She is doing well overall, hypersensitivity is improving at the incisional site.  Pertinent ROS were reviewed with the patient and found to be negative unless otherwise specified above in HPI.   Assessment & Plan: Visit Diagnoses: No diagnosis found.  Plan: She continues to do well overall.  We did once again discussed the underlying arthritic nature of the DIP joint and the possibility for cyst recurrence in the future.  She expressed full understanding.  She does have some ongoing lower back pain which is currently being worked on which is her primary focus at this juncture.  From my standpoint, she can follow-up with me as needed.  Follow-up: No follow-ups on file.   Meds & Orders: No orders of the defined types were placed in this encounter.  No orders of the defined types were placed in this encounter.    Procedures: No procedures performed       Objective:   Vital Signs: There were no vitals taken for this visit.  Ortho Exam Right long finger  - DIP region with well-healed incision, no erythema or drainage - DIP range of motion 0-45 without significant pain - No evidence of recurrence of the cystic structure at this time - Nail plate remains well-fixed beneath the eponychial fold without deformity  Imaging: No results found.   Shawntel Farnworth Alvia Jointer, M.D. [facility], Hand Surgery

## 2023-09-03 ENCOUNTER — Encounter (HOSPITAL_BASED_OUTPATIENT_CLINIC_OR_DEPARTMENT_OTHER): Payer: Self-pay | Admitting: Registered Nurse

## 2023-09-03 ENCOUNTER — Telehealth (HOSPITAL_BASED_OUTPATIENT_CLINIC_OR_DEPARTMENT_OTHER): Payer: Self-pay | Admitting: Registered Nurse

## 2023-09-03 ENCOUNTER — Other Ambulatory Visit (HOSPITAL_BASED_OUTPATIENT_CLINIC_OR_DEPARTMENT_OTHER): Payer: Self-pay | Admitting: Internal Medicine

## 2023-09-03 DIAGNOSIS — N3281 Overactive bladder: Secondary | ICD-10-CM

## 2023-09-03 NOTE — Telephone Encounter (Signed)
 Verified patient Cheryl Dixon DOB 1955-07-01 on 09/03/23 by Alethea Andes RN, BSN          Patient calling this AM, wanting to check on the status of the shower chair. Asking for a call back to follow up on this.

## 2023-09-03 NOTE — Telephone Encounter (Signed)
 PER Pharmacy, Cheryl Dixon is a 68 year old female has requested a refill of Atarx and  Vesicare       Last OFFICE/TELE Visit:  08/25/23 with [name]      Last Physical Exam:   05/27/2013     There are no preventive care reminders to display for this patient.    Other Med Adult:  Most Recent BP Reading(s)  08/25/23 : (!) 144/84        Cholesterol (mg/dL)   Date Value   96/29/5284 212     LOW DENSITY LIPOPROTEIN DIRECT (mg/dL)   Date Value   13/24/4010 151     HIGH DENSITY LIPOPROTEIN (mg/dL)   Date Value   27/25/3664 47     TRIGLYCERIDES (mg/dL)   Date Value   40/34/7425 119         THYROID  SCREEN TSH REFLEX FT4 (uIU/mL)   Date Value   08/20/2022 4.100         No results found for: "TSH"    HEMOGLOBIN A1C (%)   Date Value   08/20/2022 5.6       No results found for: "POCA1C"      INR (no units)   Date Value   12/12/2021 1.1   02/16/2007 1.0 (L)   07/03/2006 < 1.0 (L)       SODIUM (mmol/L)   Date Value   06/25/2023 140       POTASSIUM (mmol/L)   Date Value   06/25/2023 4.0           CREATININE (mg/dL)   Date Value   95/63/8756 0.8       Documented patient preferred pharmacies:    [id number] - [place], [place] - [address]  Phone: [phone] Fax: [phone]

## 2023-09-03 NOTE — Telephone Encounter (Signed)
-----   Message from Salvatore Creeks sent at 09/02/2023  4:18 PM EDT -----  Cheryl Dixon [id number], 68 year old, femaleCalls today: regarding referrals--XR and ortho? Was told that she needed some referrals in order to set up appointment Person calling on behalf of patient: Patient (self)CALL BACK NUMBER: [phone] Best time to call back: Cell phone: Other phone:Patient's language of care: EnglishPatient does not need an interpreter.Patient's PCP: Trent Frizzle, [facility]:

## 2023-09-03 NOTE — Telephone Encounter (Signed)
 Multiple calls to pt  See mychart encounter.

## 2023-09-04 ENCOUNTER — Telehealth (HOSPITAL_BASED_OUTPATIENT_CLINIC_OR_DEPARTMENT_OTHER): Payer: Self-pay

## 2023-09-04 ENCOUNTER — Other Ambulatory Visit (HOSPITAL_BASED_OUTPATIENT_CLINIC_OR_DEPARTMENT_OTHER): Payer: Self-pay

## 2023-09-04 DIAGNOSIS — M25511 Pain in right shoulder: Secondary | ICD-10-CM

## 2023-09-04 NOTE — Telephone Encounter (Signed)
-----   Message from Sallyanne Creamer sent at 09/04/2023  9:10 AM EDT -----  Regarding: ESPISITO pt  Contact: [phone]  Ammon Bales pt daughter calling for pt, calling to request x-rays at pt f/u on 09/07/23 for Status post revision of total replacement of right knee. Pt still in pain wondering what's going on, pt considering getting Cortizone shot. Please call and advise

## 2023-09-04 NOTE — Telephone Encounter (Signed)
 I called and spoke with the patients daughter Ammon Bales.    The patients daughter Ammon Bales called the office today inquiring about x-rays for when her mother come in on Monday Sep 07, 2023 for her appointment.  The patient had seen PCP the other day and spoke of extreme shoulder pain.  It was said that they should follow up with Ortho.  The patient daughter called to inform ortho of the new problem and I have ordered imaging to further evaluate.

## 2023-09-04 NOTE — Telephone Encounter (Signed)
Vm left for pt to call rcc back.

## 2023-09-07 ENCOUNTER — Other Ambulatory Visit: Payer: Self-pay

## 2023-09-07 ENCOUNTER — Ambulatory Visit (HOSPITAL_BASED_OUTPATIENT_CLINIC_OR_DEPARTMENT_OTHER): Admit: 2023-09-07 | Discharge: 2023-09-07 | Disposition: A | Discharge status: Home / Self Care

## 2023-09-07 ENCOUNTER — Ambulatory Visit: Admission: RE | Admit: 2023-09-07 | Discharge: 2023-09-07 | Disposition: A | Discharge status: Home / Self Care

## 2023-09-07 ENCOUNTER — Ambulatory Visit (HOSPITAL_BASED_OUTPATIENT_CLINIC_OR_DEPARTMENT_OTHER): Admitting: Orthopaedic Surgery

## 2023-09-07 DIAGNOSIS — R52 Pain, unspecified: Secondary | ICD-10-CM

## 2023-09-07 DIAGNOSIS — M19011 Primary osteoarthritis, right shoulder: Secondary | ICD-10-CM | POA: Insufficient documentation

## 2023-09-07 DIAGNOSIS — Z96651 Presence of right artificial knee joint: Secondary | ICD-10-CM | POA: Diagnosis present

## 2023-09-07 DIAGNOSIS — Z471 Aftercare following joint replacement surgery: Secondary | ICD-10-CM | POA: Insufficient documentation

## 2023-09-07 DIAGNOSIS — M19012 Primary osteoarthritis, left shoulder: Secondary | ICD-10-CM | POA: Insufficient documentation

## 2023-09-07 DIAGNOSIS — M25511 Pain in right shoulder: Secondary | ICD-10-CM

## 2023-09-07 NOTE — Telephone Encounter (Signed)
 Pt seen by ortho today  Encounter closed

## 2023-09-08 ENCOUNTER — Ambulatory Visit: Attending: Orthopaedic Surgery | Admitting: Orthopaedic Surgery

## 2023-09-08 ENCOUNTER — Other Ambulatory Visit (HOSPITAL_BASED_OUTPATIENT_CLINIC_OR_DEPARTMENT_OTHER): Payer: Self-pay | Admitting: Family Medicine

## 2023-09-08 ENCOUNTER — Ambulatory Visit: Admitting: Family Medicine

## 2023-09-08 DIAGNOSIS — M19011 Primary osteoarthritis, right shoulder: Secondary | ICD-10-CM | POA: Diagnosis present

## 2023-09-08 DIAGNOSIS — F1120 Opioid dependence, uncomplicated: Secondary | ICD-10-CM

## 2023-09-08 DIAGNOSIS — M19012 Primary osteoarthritis, left shoulder: Secondary | ICD-10-CM | POA: Diagnosis present

## 2023-09-08 MED ORDER — BUPRENORPHINE HCL-NALOXONE HCL 8-2 MG SL FILM
ORAL_FILM | SUBLINGUAL | 0 refills | Status: DC
Start: 2023-09-08 — End: 2023-10-06

## 2023-09-08 NOTE — Progress Notes (Signed)
 Date of service:  09/08/2023    I have personally seen and examined the patient.    This is a 68 year old year old left-hand-dominant female who presents for chronic bilateral shoulder pain.  She has had shoulder pain for several years.  She underwent right total knee arthroplasty last year that unfortunately required a revision a few months ago with Dr. [name].  She has been using wheelchair and walker and has had a lot of stress through her arms for assistance in ambulation.  This is exacerbated her shoulder pain more so on the left side.  She denies prior injuries to the shoulder or treatments in the past.    Physical examination:   General - the patient is alert and oriented x3, well appearing, in no acute distress.    Bilateral shoulder strength screening intact.  No atrophy or deformity.  Right active elevation 150, 90 on the left side.  Further passive motion bilaterally is 160.  External rotation at the side is 20, internal rotation to buttocks.  Strength 5/5 in elevation and rotation with pain.  Neurovascularly intact    Radiographs: Bilateral shoulder films from yesterday personally independently interpreted demonstrate there is severe bilateral glenohumeral joint arthritis with joint narrowing, subchondral sclerosis and cyst.  No bony abnormalities are noted.    Assessment: Severe bilateral shoulder glenohumeral arthritis    Plan: I reviewed the findings with the patient and her son.  We discussed shoulder arthritis, natural history and treatment options.  For conservative care, I encouraged light exercising to promote muscle strength and conditioning.  We also discussed corticosteroid injections for help with pain symptoms.  If she were to fail conservative management, we did briefly discuss surgical options which would consist of total shoulder arthroplasty.  We discussed the preoperative process including MRI and CT scanning for surgical planning and templating.  She is interested in cortisone  injections at this time and does not feel she is at a point yet to proceed with shoulder arthroplasty.  She would need to have recovered more anyway from her knee surgery and that she will not need to use the upper extremities for 4 assistive devices for ambulation.  She is referred to [facility] radiology for the injections and may follow-up with me as needed.    I agree with the remainder of the plan per the PA and personally spent the substantive portion of the encounter providing care for the patient.    A total of 25 minutes was spent on this visit on the date of service (total time includes all activities performed on the date of service)    This note was prepared using voice recognition software.  Please disregard any transcription errors.

## 2023-09-08 NOTE — Progress Notes (Signed)
 Orthopedic Office Note    CC: Patient presents with:  Referral: Bi Lateral shoulder pain left worse than right .      ORTHOPEDIC PROBLEM LIST:  1. Bilateral shoulder osteoarthritis  2. S/p right TKA, DOS: 03/11/2023, Doherty   3. Open right patella tendon rupture (grade 3), DOI: 03/26/2023, s/p poly exchange, irrigation/wound debridement, patellar tendon repair with cerclage cable, DOS: 03/29/2023, [name]   4. S/p revision right TKA to constrained prosthesis, extensor mechanism reconstruction, patellectomy and ROH, DOS: 05/19/23, [name]     HPI: Cheryl Dixon is a 68 year old female who is here today for evaluation of bilateral shoulder pain.  Patient reports that her symptoms have been present for around 1-2 years now but have worsened over the last few months.  Patient is known to the orthopedics team and has been treated for her right knee over the last year.  She has had a complex surgical history undergoing 3 different procedures for her right knee, most recently a revision right TKA with Dr. [name].  She is currently recovering from that surgery.  Reports that her shoulder pain worsened when she was using a walker consistently due to her knee.  This put a lot of stress on to her shoulders.  Prior to her knee issues she did not use any assistive devices.  She hopes to be back to walking on her own at some point this summer.  She has never done physical therapy or had cortisone injections into the shoulders.  She takes Tylenol  as needed with minimal relief.  She lives alone but her son and daughter come to stay with her, especially recently.  She reports that the left shoulder is more painful than the right.  Patient is left-hand dominant.  Patient does not smoke.  She takes a baby aspirin  daily.  She also has COPD.  Denies any prior injury or trauma to the shoulders.  States that her range of motion is greatly limited and that she feels weak. Denies any numbness or tingling distally.    IMAGING: x-ray of  the left and right shoulders reveals severe glenohumeral joint osteoarthritis. Imaging was reviewed and interpreted by myself and Dr. Laurell Pond during this visit.     PHYSICAL EXAM:  GENERAL: Alert and oriented, in no acute distress  MOOD: Appropriate.   MUSCULOSKELETAL: Evaluation of the bilateral shoulders reveals no gross bony deformities.  Overlying skin is warm, dry and intact. There is no erythema, edema, ecchymosis or effusion.  NTTP around the shoulder. Left: Active FE 90 degrees, further passively to 120 degrees. Right: Active FE to 150, further passively to 160 with discomfort. Strength 5/5.  Bilateral upper extremities are neurovascularly intact distally.    ASSESSMENT/PLAN: 68 year old female here today with severe bilateral shoulder osteoarthritis.  Discussed with the patient that given the severity of her arthritis, a total shoulder replacement is indicated at this time.  Given patient's recent surgical history regarding her knee, we will wait until she is fully recovered from this to move forward with surgical planning.  Patient would like to try cortisone injections in the meantime, this is reasonable.  I placed an order for bilateral shoulder IR guided cortisone injections.  This will be scheduled on her way out today.  Advised her to continue with home exercises in the interim focusing on strengthening.  We discussed that there are some steps that we will need to be done prior to the surgery including medical and dental clearance, MRI and CT  scans for surgical planning, and attending another total joint class.  Patient is interested in moving forward with surgery once her knee is recovered.  Patient will follow-up with Dr. Laurell Pond in 3 months to further discuss total shoulder arthroplasty.  Advised her to call the office in the interim with any questions or concerns.     We reviewed the likely diagnosis, the prognosis, and various treatment options in detail. Risks and benefits of treatment plan  discussed. The patient's questions have been answered, and the patient understands agrees with treatment plan.     Dr. Laurell Pond saw and examined the patient and formulated the assessment and plan.    [name], PA-C, 09/08/2023    Nursing Communication:  None

## 2023-09-10 ENCOUNTER — Telehealth (HOSPITAL_BASED_OUTPATIENT_CLINIC_OR_DEPARTMENT_OTHER): Payer: Self-pay

## 2023-09-10 DIAGNOSIS — G8929 Other chronic pain: Secondary | ICD-10-CM

## 2023-09-10 NOTE — Telephone Encounter (Signed)
 Pt calling in req referral for PT for both shoulders- actively sees Ortho for this and did recently have surgery. Pt states the ortho provider told her to get a PT referral from her PCP not them and that "Dr Selestino Dakin knows all about my shoulders". Advised will send a PT request to her PCP.

## 2023-09-10 NOTE — Addendum Note (Signed)
 Addended by: Kalyiah Saintil on: 09/10/2023 03:32 PM     Modules accepted: Orders

## 2023-09-14 ENCOUNTER — Ambulatory Visit: Admission: RE | Admit: 2023-09-14 | Discharge: 2023-09-14 | Disposition: A | Discharge status: Home / Self Care

## 2023-09-14 ENCOUNTER — Other Ambulatory Visit (HOSPITAL_BASED_OUTPATIENT_CLINIC_OR_DEPARTMENT_OTHER): Payer: Self-pay

## 2023-09-14 ENCOUNTER — Other Ambulatory Visit: Payer: Self-pay

## 2023-09-14 ENCOUNTER — Ambulatory Visit: Admitting: Physician Assistant

## 2023-09-14 DIAGNOSIS — M25511 Pain in right shoulder: Secondary | ICD-10-CM

## 2023-09-14 DIAGNOSIS — M19012 Primary osteoarthritis, left shoulder: Secondary | ICD-10-CM | POA: Diagnosis not present

## 2023-09-14 DIAGNOSIS — M25512 Pain in left shoulder: Secondary | ICD-10-CM

## 2023-09-14 DIAGNOSIS — M19011 Primary osteoarthritis, right shoulder: Secondary | ICD-10-CM

## 2023-09-14 DIAGNOSIS — G8929 Other chronic pain: Secondary | ICD-10-CM

## 2023-09-14 DIAGNOSIS — Z96651 Presence of right artificial knee joint: Secondary | ICD-10-CM

## 2023-09-14 MED ORDER — TRIAMCINOLONE ACETONIDE 40 MG/ML IJ SUSP
INTRAMUSCULAR | Status: AC
Start: 2023-09-14 — End: 2023-09-14
  Administered 2023-09-14 (×2): 1 mg via INTRA_ARTICULAR
  Filled 2023-09-14: qty 2

## 2023-09-14 MED ORDER — IOHEXOL 300 MG/ML IJ SOLN
1.0000 mL | Freq: Once | INTRAMUSCULAR | Status: AC
Start: 2023-09-14 — End: 2023-09-14
  Administered 2023-09-14 (×2): 2 mL via INTRA_ARTICULAR

## 2023-09-14 MED ORDER — LIDOCAINE HCL 1 % IJ SOLN
INTRAMUSCULAR | Status: AC
Start: 2023-09-14 — End: 2023-09-14
  Administered 2023-09-14 (×2): 8 mL via INTRA_ARTICULAR
  Filled 2023-09-14: qty 20

## 2023-09-14 MED ORDER — SODIUM BICARBONATE 4.2 % IV SOLN
INTRAVENOUS | Status: DC
Start: 2023-09-14 — End: 2023-09-15
  Filled 2023-09-14: qty 10

## 2023-09-14 MED ORDER — SODIUM CHLORIDE (PF) 0.9 % IJ SOLN
INTRAMUSCULAR | Status: DC
Start: 2023-09-14 — End: 2023-09-14
  Filled 2023-09-14: qty 20

## 2023-09-14 MED ORDER — ROPIVACAINE HCL 5 MG/ML IJ SOLN
INTRAMUSCULAR | Status: AC
Start: 2023-09-14 — End: 2023-09-14
  Administered 2023-09-14 (×2): 3 mL via INTRA_ARTICULAR
  Filled 2023-09-14: qty 30

## 2023-09-14 NOTE — Progress Notes (Unsigned)
 Cheryl Dixon is a 68 year old female pt of Dr. Trent Frizzle, MD who presents for shower chair        Shower chair  Recent history of right TKA with complications requiring revision, see previous note for further details  Also has bilateral shoulder arthritis  Recently saw orthopedics for steroid injection of the shoulders  Currently is in a knee immobilizer  Was instructed that she could do weightbearing as tolerated  Gets around with a cane/walker  However due to these issues mobility is a little bit difficult  Would like shower chair to help her  Also interested in a wheelchair      Patient Active Problem List:     Opioid use disorder on agonist therapy (HCC)     Tobacco use disorder     Fibrocystic breast     Essential hypertension     Knee pain     Chronic low back pain     OA (osteoarthritis) of knee     H. pylori infection     Major depressive disorder, recurrent episode, severe (HCC)     Occult blood in stools     Prediabetes     Epigastric pain     Chronic obstructive pulmonary disease (HCC)     Left foot pain     Multiple polyps of colon     Acquired deformity of toe, right     Dislocation of metatarsophalangeal joint of lesser toe, right, sequela     Postmenopausal atrophic vaginitis     Abnormal loss of weight     Aneurysm of ascending aorta without rupture (HCC)     COVID-19 virus infection     BMI 32.0-32.9,adult     Chronic dysuria     LLQ pain     Osteoporosis     History of endometriosis     Lung cancer screening     Constipation, drug induced     Weakness     Cerebrovascular accident (CVA) (HCC)     Overactive bladder      [facility]) [phone] Option #4     Substance addiction (HCC)     Anxiety     Obese     S/P total knee arthroplasty, right     Patellar tendon rupture, right, initial encounter     Status post total right knee replacement     Rupture patellar tendon, right, subsequent encounter     Patellar tendon rupture, right, sequela     S/P revision of total knee,  right     Gait instability     Pain of right lower extremity    buprenorphine -naloxone  (SUBOXONE ) 8-2 MG sublingual film, Take 1.5 films under the tongue in the morning, 1 film midday, and 1 film at night, Disp: 98 Film, Rfl: 0  hydrOXYzine  (ATARAX ) 25 MG tablet, Take 1 tablet by mouth nightly as needed for Itching (Patient not taking: Reported on 09/08/2023), Disp: 30 tablet, Rfl: 0  solifenacin  (VESICARE ) 10 MG tablet, TAKE 1 TABLET BY MOUTH EVERY DAY IN THE MORNING (Patient not taking: Reported on 09/08/2023), Disp: 90 tablet, Rfl: 0  OTHER MEDICATION, SHOWER CHAIR  For use as directed.    DX: gait instability, OA knee  ICD10:R26.81, M17.9 (Patient not taking: Reported on 09/08/2023), Disp: 1 each, Rfl: 0  calcium  carb-cholecalciferol  500-10 MG-MCG TABS tablet, Take 1 tablet by mouth in the morning and 1 tablet before bedtime., Disp: 180 tablet, Rfl: 3  clonazePAM  (KLONOPIN ) 0.5 MG tablet,  Take 1 tablet by mouth 2 (two) times daily as needed for Anxiety  for up to 14 days, Disp: 14 tablet, Rfl: 1  magnesium  oxide (MAG-OX) 400 (240 Mg) MG tablet, Take 1 tablet by mouth daily, Disp: 90 tablet, Rfl: 3  celecoxib  (CELEBREX ) 200 MG capsule, Take 1 capsule by mouth daily (Patient not taking: Reported on 09/08/2023), Disp: 30 capsule, Rfl: 0  acetaminophen  (TYLENOL ) 500 MG tablet, Take 1 tablet by mouth every 8 (eight) hours as needed for Pain, Disp: 549 tablet, Rfl: 0  aspirin  (ASPIRIN  81) 81 MG chewable tablet, Take 1 tablet by mouth in the morning and 1 tablet before bedtime., Disp: , Rfl:   estradiol  (ESTRACE ) 0.1 MG/GM vaginal cream, PLACE 5 G VAGINALLY 3 TIMES A WEEK MYLAN BRAND ONLY., Disp: 42.5 g, Rfl: 5  Cholecalciferol  (VITAMIN D3) 50 MCG (2000 UT) TABS, TAKE 1 TABLET BY MOUTH EVERY DAY IN THE MORNING, Disp: 90 tablet, Rfl: 3  famotidine  (PEPCID ) 20 MG tablet, TAKE 1 TABLET BY MOUTH IN THE MORNING AND BEFORE BEDTIME (Patient not taking: Reported on 09/08/2023), Disp: 180 tablet, Rfl: 0  atorvastatin  (LIPITOR) 80 MG  tablet, Take 1 tablet by mouth daily after dinner, Disp: 90 tablet, Rfl: 3  polyethylene glycol (GLYCOLAX /MIRALAX ) 17 g packet, Take 1 packet by mouth daily, Disp: 90 packet, Rfl: 3  albuterol  HFA 108 (90 Base) MCG/ACT inhaler, Inhale 2 puffs into the lungs every 6 (six) hours as needed for Wheezing, Disp: , Rfl:   docusate sodium  (COLACE) 100 MG capsule, TAKE 1 CAPSULE BY MOUTH IN THE MORNING AND BEFORE BEDTIME, Disp: 180 capsule, Rfl: 3  tiotropium-olodaterol (STIOLTO RESPIMAT ) 2.5-2.5 MCG/ACT inhal, Inhale 2 puffs into the lungs daily (Patient not taking: Reported on 09/08/2023), Disp: , Rfl:   buPROPion  (WELLBUTRIN  SR) 200 MG 12 hr tablet, TAKE 1 TABLET BY MOUTH IN THE MORNING AND BEFORE BEDTIME, Disp: 180 tablet, Rfl: 3  cetirizine  (ZYRTEC ) 10 MG tablet, TAKE 1 TABLET BY MOUTH EVERY DAY IN THE MORNING, Disp: 90 tablet, Rfl: 3    No current facility-administered medications on file prior to visit.    Review of Patient's Allergies indicates:   Darvon                     Meperidine hcl              Comment:Nausea/vomit   Paxil [paroxetine]      Rash   Zoloft  [sertraline  *    Rash        Exam  Pulmonary - speaking full sentences          ASSESSMENT/PLAN        (M25.511,  G89.29,  M25.512) Chronic pain of both shoulders  (primary encounter diagnosis)  (M84.132) S/P revision of total knee, right  Comment: Pain and limited mobility due to several orthopedic issues.  Reports she would like a shower chair, and per chart review a prescription for this was already placed.  Advised patient it can take 4 to 6 weeks for insurance to approve these requests.  Patient also states she thinks she needs a wheelchair.  After further discussion she reports that she does practice weightbearing as tolerated per Ortho, generally able to get around okay but does have assistance when going to appointments.  Discussed that wheelchair might not be the best option at this point as it may exacerbate any muscle weakness she has as a result of  her multiple knee surgeries, and that weightbearing could help  with increasing muscle strength and mobility.  She agrees and will hold off on wheelchair at this time  Plan: Will check on status of shower chair           Advised to follow-up in clinic if still having difficulty getting around as he heals and strength improves, could rediscuss option of wheelchair                 I spent a total of 24 minutes on this visit on the date of service (total time includes all activities performed on the date of service)

## 2023-09-14 NOTE — Progress Notes (Signed)
 Orthopedic Office Note    CC: Patient presents with:  Follow Up: Follow up- patella tendon       ORTHOPEDIC PROBLEM LIST:  1. S/p right TKA, DOS: 03/11/2023, Doherty  2. Open right patella tendon rupture (grade 3), DOI: 03/26/2023, s/p poly exchange, irrigation/wound debridement, patellar tendon repair with cerclage cable, DOS: 03/29/2023, [name]  3. S/p revision right TKA to constrained prosthesis, extensor mechanism reconstruction, patellectomy and ROH, DOS: 05/19/23, [name]    HPI: Cheryl Dixon is a 68 year old female who presents today for postop evaluation for her right knee. She is 3.5 months post op.     To review: Patient has had a complicated postop course regarding her right knee. She was initially a patient of Dr. Frieda Jew who underwent a primary total knee replacement without complication on 03/11/2023. Unfortunately, she sustained a mechanical fall on 03/26/2023; she was sitting in a chair that was lower than she had expected; felt a slight popping sensation and immediately had pain, bleeding. She was found to have a 2.5 cm inferior wound dehiscence as well as evidence for a patellar tendon rupture. She was placed into a knee immobilizer. She subsequently had a new injury on 03/28/2023 where her right leg gave out while wearing the knee immobilizer causing increased bleeding. She was BIBA to the ED and admitted to the medical service in anticipation of surgical intervention. On exam she was found to have a grade 3 open dehiscence of a right total knee arthroplasty wound and subsequently underwent an open right patellar tendon rupture repair, irrigation and excisional debridement, antibiotic bead placement, extensor tendon tendon reinforcement with cerclage cable, poly exchange and closure. She followed up with Dr. Quinn Bucco office on 04/14/2023 and x-ray/clinical evaluation showed evidence for failure of the patellar tendon repair with patella alta and displacement of the cable. She had an  infectious work up with Dr. [name] that was negative; she underwent surgery on 05/19/23 and discharged home the next day after she was put into a long leg cast on the floor with a window for her Wound Vac.  Cast was modified 06/29/2023.     No new injuries or traumas. Pain is controlled with Tylenol  PRN.       No new symptoms. Denies any numbness or tingling distally. No calf pain, chest pain or SOB.     No local/systemic symptoms/signs of infection.    She has been WBAT using a walker or cane and mobilizes with wheelchair also.        IMAGING: x-ray of the right knee show a constrained TKA in good alignment. No evidence for hardware failure. Some cement lateral to the proximal tibia presumably from extrusion out of the hole from previous cerclage cable.        PHYSICAL EXAM:  GENERAL: Alert and oriented, in no acute distress  MOOD: Appropriate.   MUSCULOSKELETAL: Long leg cast removed today.  Evaluation of the right lower extremity reveals no skin problems from the cast. Skin is warm, dry, intact. Surgical wound is healing well. No signs of infection.  Thigh and calf soft and NTTP. Readily able to wiggle toes. NVI distally.    ASSESSMENT/PLAN: 68 year old female who presents today now 3.5 months postop from a right knee removal of hardware, revision total knee replacement to a constrained prosthesis, extensor mechanism reconstruction, patellectomy on 05/19/2023.  Surgical wound healing well.  Continue bledsoe brace locked in full extension.  Continue WBAT using walker or cane in locked bledsoe  brace in full extension.  Patient prefers home PT.  Active gravity flexion in brace to 45 degrees only for the first month, then 60 degrees for the second month and then 90 degrees for the third month.  Total time in brace is 3 months then wean out.    FU in 4 weeks for re-evaluation and x-rays.    We reviewed the likely diagnosis, the prognosis, and various treatment options in detail. Risks and benefits of treatment plan  discussed. The patient's questions have been answered, and the patient understands agrees with treatment plan.     I answered all the patient's questions today to the best of my ability. Patient expressed understanding of an agreement with the plan. Patient will return or report to the ER immediately with any new concerning symptoms for DVT, PE, or infection.     I spent a total of 30 minutes on this visit on the date of service (total time includes all activities performed on the date of service) In that time I performed a complete history and physical exam, reviewed and interpreted patient imaging, coordinated the next steps of the patients care, communicated the plan and diagnosis, placed orders, and preformed appropriate documentation.    Above is my independent interpretation of radiographic studies listed, which were reviewed by me today.      Jaclynn Mast, MD      Nursing Communication:    Follow up in 4 weeks with new xrays.

## 2023-09-16 ENCOUNTER — Encounter (HOSPITAL_BASED_OUTPATIENT_CLINIC_OR_DEPARTMENT_OTHER): Payer: Self-pay | Admitting: Physician Assistant

## 2023-09-17 ENCOUNTER — Encounter (HOSPITAL_BASED_OUTPATIENT_CLINIC_OR_DEPARTMENT_OTHER): Payer: Self-pay

## 2023-09-17 ENCOUNTER — Other Ambulatory Visit (HOSPITAL_BASED_OUTPATIENT_CLINIC_OR_DEPARTMENT_OTHER): Payer: Self-pay | Admitting: Internal Medicine

## 2023-09-17 MED ORDER — CLONAZEPAM 0.5 MG PO TABS
0.5000 mg | ORAL_TABLET | Freq: Two times a day (BID) | ORAL | 1 refills | Status: DC | PRN
Start: 2023-09-17 — End: 2023-10-12

## 2023-09-17 NOTE — Telephone Encounter (Signed)
 PER Pharmacy, Cheryl Dixon is a 68 year old female has requested a refill of     clonazePAM  (KLONOPIN ) 0.5 MG tablet    End Date: 09/15/23       Last OFFICE/TELE Visit:  09/14/23 with Hayes Lipps      Last Physical Exam:   05/27/2013     There are no preventive care reminders to display for this patient.    Other Med Adult:  Most Recent BP Reading(s)  08/25/23 : (!) 144/84        Cholesterol (mg/dL)   Date Value   40/34/7425 212     LOW DENSITY LIPOPROTEIN DIRECT (mg/dL)   Date Value   95/63/8756 151     HIGH DENSITY LIPOPROTEIN (mg/dL)   Date Value   43/32/9518 47     TRIGLYCERIDES (mg/dL)   Date Value   84/16/6063 119         THYROID  SCREEN TSH REFLEX FT4 (uIU/mL)   Date Value   08/20/2022 4.100         No results found for: "TSH"    HEMOGLOBIN A1C (%)   Date Value   08/20/2022 5.6       No results found for: "POCA1C"      INR (no units)   Date Value   12/12/2021 1.1   02/16/2007 1.0 (L)   07/03/2006 < 1.0 (L)       SODIUM (mmol/L)   Date Value   06/25/2023 140       POTASSIUM (mmol/L)   Date Value   06/25/2023 4.0           CREATININE (mg/dL)   Date Value   01/60/1093 0.8       Documented patient preferred pharmacies:    [id number] - [place], [place] - [address]  Phone: [phone] Fax: [phone]

## 2023-09-17 NOTE — Telephone Encounter (Signed)
 [facility] received an order for shower chair. This has been forwarded to [facility] and scanned into Care[employer] information officer. Due to COVID-19 [facility] is using provider's signature on file to submit DME requests.    Please note DME requests can take 4 to 6 weeks to receive a decision.       For any updates, patient should call supply company directly at [phone]

## 2023-09-22 ENCOUNTER — Ambulatory Visit: Payer: No Typology Code available for payment source | Admitting: Family Medicine

## 2023-09-22 ENCOUNTER — Ambulatory Visit: Attending: Family Medicine | Admitting: Family Medicine

## 2023-09-22 DIAGNOSIS — F1120 Opioid dependence, uncomplicated: Secondary | ICD-10-CM | POA: Insufficient documentation

## 2023-09-22 NOTE — Progress Notes (Signed)
 Cheryl Dixon is a 68 year old female seen in follow up for opioid use disorder:    Buprenorphine  dose: 3.5 films a day rx'ed (previously 2.5 films a day - increased iso pain, arthritis, surgeries)  Response, adequacy of dose: as above  Relapses/close calls: No  Meetings: attends every 4+ week  Non-prescribed substance use: None reported    Social history/events:  None, suboxone  working well    Additional psychiatric or medical care/events:  S/p revision of right TKA, now going for shoulder surgery    Present Medications:  clonazePAM  (KLONOPIN ) 0.5 MG tablet, Take 1 tablet by mouth 2 (two) times daily as needed for Anxiety  for up to 14 days, Disp: 14 tablet, Rfl: 1  buprenorphine -naloxone  (SUBOXONE ) 8-2 MG sublingual film, Take 1.5 films under the tongue in the morning, 1 film midday, and 1 film at night, Disp: 98 Film, Rfl: 0  hydrOXYzine  (ATARAX ) 25 MG tablet, Take 1 tablet by mouth nightly as needed for Itching (Patient not taking: Reported on 09/08/2023), Disp: 30 tablet, Rfl: 0  solifenacin  (VESICARE ) 10 MG tablet, TAKE 1 TABLET BY MOUTH EVERY DAY IN THE MORNING (Patient not taking: Reported on 09/08/2023), Disp: 90 tablet, Rfl: 0  OTHER MEDICATION, SHOWER CHAIR  For use as directed.    DX: gait instability, OA knee  ICD10:R26.81, M17.9 (Patient not taking: Reported on 09/08/2023), Disp: 1 each, Rfl: 0  calcium  carb-cholecalciferol  500-10 MG-MCG TABS tablet, Take 1 tablet by mouth in the morning and 1 tablet before bedtime., Disp: 180 tablet, Rfl: 3  magnesium  oxide (MAG-OX) 400 (240 Mg) MG tablet, Take 1 tablet by mouth daily, Disp: 90 tablet, Rfl: 3  celecoxib  (CELEBREX ) 200 MG capsule, Take 1 capsule by mouth daily (Patient not taking: Reported on 09/08/2023), Disp: 30 capsule, Rfl: 0  acetaminophen  (TYLENOL ) 500 MG tablet, Take 1 tablet by mouth every 8 (eight) hours as needed for Pain, Disp: 549 tablet, Rfl: 0  aspirin  (ASPIRIN  81) 81 MG chewable tablet, Take 1 tablet by mouth in the morning and 1 tablet before  bedtime., Disp: , Rfl:   estradiol  (ESTRACE ) 0.1 MG/GM vaginal cream, PLACE 5 G VAGINALLY 3 TIMES A WEEK MYLAN BRAND ONLY., Disp: 42.5 g, Rfl: 5  Cholecalciferol  (VITAMIN D3) 50 MCG (2000 UT) TABS, TAKE 1 TABLET BY MOUTH EVERY DAY IN THE MORNING, Disp: 90 tablet, Rfl: 3  famotidine  (PEPCID ) 20 MG tablet, TAKE 1 TABLET BY MOUTH IN THE MORNING AND BEFORE BEDTIME (Patient not taking: Reported on 09/08/2023), Disp: 180 tablet, Rfl: 0  atorvastatin  (LIPITOR) 80 MG tablet, Take 1 tablet by mouth daily after dinner, Disp: 90 tablet, Rfl: 3  polyethylene glycol (GLYCOLAX /MIRALAX ) 17 g packet, Take 1 packet by mouth daily, Disp: 90 packet, Rfl: 3  albuterol  HFA 108 (90 Base) MCG/ACT inhaler, Inhale 2 puffs into the lungs every 6 (six) hours as needed for Wheezing, Disp: , Rfl:   docusate sodium  (COLACE) 100 MG capsule, TAKE 1 CAPSULE BY MOUTH IN THE MORNING AND BEFORE BEDTIME, Disp: 180 capsule, Rfl: 3  tiotropium-olodaterol (STIOLTO RESPIMAT ) 2.5-2.5 MCG/ACT inhal, Inhale 2 puffs into the lungs daily (Patient not taking: Reported on 09/08/2023), Disp: , Rfl:   buPROPion  (WELLBUTRIN  SR) 200 MG 12 hr tablet, TAKE 1 TABLET BY MOUTH IN THE MORNING AND BEFORE BEDTIME, Disp: 180 tablet, Rfl: 3  cetirizine  (ZYRTEC ) 10 MG tablet, TAKE 1 TABLET BY MOUTH EVERY DAY IN THE MORNING, Disp: 90 tablet, Rfl: 3    No current facility-administered medications on file prior to visit.  Last UDS:  LAST UDS   AMPHETAMINES URINE (ng/mL)   Date Value   10/27/2022 NEGATIVE     COCAINE  METABOLITES URINE (ng/mL)   Date Value   10/27/2022 NEGATIVE     OPIATES URINE (ng/mL)   Date Value   10/27/2022 NEGATIVE     BENZODIAZEPINES URINE (ng/mL)   Date Value   10/27/2022 NEGATIVE     CANNABINOIDS URINE (ng/mL)   Date Value   10/27/2022 POSITIVE (A)     ETHANOL URINE (mg/dL)   Date Value   16/01/9603 NEGATIVE       Review of Systems: no notable symptoms    PHYSICAL EXAMINATION: by video  General appearance - healthy female in no distress  Eyes - pupils  normal  Skin - warm and dry  Neuro - nonfocal  Affect - normal    Problem List Items Addressed This Visit          Mental Health    Opioid use disorder on agonist therapy (HCC)         Today in group we discussed relapse prevention. Dannis Dy contributed.    Assessment:  Opioid use disorder  Comment: the patient actively participated and shared experiences, interacting appropriately with group without barriers to understanding  Plan:  Continue suboxone  to 3.5 films daily dose divided TID (1.5 films in AM, 1 film mid day, and 1 film at night) for OUD and pain control. Discussed today and she prefers to continue this dose until she has better pain control.  Patient is counseled regarding relapse prevention, involvement in recovery groups, and potential side effects of buprenorphine .    Aurelio Leer, MD, 09/22/2023

## 2023-09-23 ENCOUNTER — Telehealth (HOSPITAL_BASED_OUTPATIENT_CLINIC_OR_DEPARTMENT_OTHER): Payer: Self-pay | Admitting: Registered Nurse

## 2023-09-23 ENCOUNTER — Other Ambulatory Visit (HOSPITAL_BASED_OUTPATIENT_CLINIC_OR_DEPARTMENT_OTHER): Payer: Self-pay | Admitting: Physician Assistant

## 2023-09-23 DIAGNOSIS — Z96651 Presence of right artificial knee joint: Secondary | ICD-10-CM

## 2023-09-23 NOTE — Telephone Encounter (Signed)
 Left message for pt to call rcc back.      ----- Message from Marylene Snuffer sent at 09/23/2023  9:58 AM EDT -----  Regarding: referral for PT  Cheryl Dixon [id number], 68 year old, female, Telephone Information:  Home Phone      [phone]  Work Phone      Not on file.  Mobile          [phone]           Calls today: Other: States she had knee surgery and needs referral for PT stated surgeon was supposed to put order in but never did and was told to contact PCP.    Person calling on behalf of patient: Patient (self)        Patient's language of care: English        Patient's PCP: Trent Frizzle, MD

## 2023-09-24 ENCOUNTER — Telehealth (HOSPITAL_BASED_OUTPATIENT_CLINIC_OR_DEPARTMENT_OTHER): Payer: Self-pay

## 2023-09-24 NOTE — Telephone Encounter (Signed)
 I called and left a message for the patient.    The patient had called the office requesting an internal referral for physical therapy.  This was completed and I called to let the patient know it was complete.

## 2023-09-24 NOTE — Addendum Note (Signed)
 Addended by: Ellena Gurney on: 09/24/2023 03:53 PM     Modules accepted: Orders

## 2023-09-24 NOTE — Telephone Encounter (Signed)
-----   Message from Ellena Gurney sent at 09/23/2023  6:21 PM EDT -----  Regarding: RE: ESPISITO pt PT  Contact: [phone]  All set!  ----- Message -----  From: Debroah Fanning., LPN  Sent: 5/78/4696  10:20 AM EDT  To: Ellena Gurney, PA-C  Subject: FW: ESPISITO pt PT                               Good Morning,        The patient is requesting an internal physical therapy referral.  Please advise.  Have a great day!  Marily Shows  ----- Message -----  From: Brannon Calamity  Sent: 09/23/2023  10:05 AM EDT  To: [facility] Pool  Subject: ESPISITO pt PT                                   Pt called looking for an internal PT referral for her knee pain. Pt had SURGERY 05/19/23 for REVISION, TOTAL ARTHROPLASTY, KNEE, ALL COMPONENTS Right General  REPAIR, TENDON, PATELLAR [[id number] (CPT)]        Please call and advise once PT referral is sent

## 2023-09-25 ENCOUNTER — Encounter (HOSPITAL_BASED_OUTPATIENT_CLINIC_OR_DEPARTMENT_OTHER): Payer: Self-pay | Admitting: Registered Nurse

## 2023-09-25 ENCOUNTER — Telehealth (HOSPITAL_BASED_OUTPATIENT_CLINIC_OR_DEPARTMENT_OTHER): Payer: Self-pay

## 2023-09-25 ENCOUNTER — Telehealth (HOSPITAL_BASED_OUTPATIENT_CLINIC_OR_DEPARTMENT_OTHER): Payer: Self-pay | Admitting: Registered Nurse

## 2023-09-25 DIAGNOSIS — Z96651 Presence of right artificial knee joint: Secondary | ICD-10-CM

## 2023-09-25 MED ORDER — CELECOXIB 200 MG PO CAPS
200.0000 mg | ORAL_CAPSULE | Freq: Every day | ORAL | 0 refills | Status: DC
Start: 2023-09-25 — End: 2023-11-24

## 2023-09-25 NOTE — Telephone Encounter (Signed)
-----   Message from Sallyanne Creamer sent at 09/25/2023 10:26 AM EDT -----  Regarding: Cheryl Dixon pt  Contact: [phone]  Pt taking tylenol , pt taking motrin , applied topicals, even bought heating pads, nothing is working, exhausting over the counter methods for relief. Pt having  extreme shoulder pain can barley hold the phone up. Pt last seen 09/08/23 for Primary osteoarthritis of left shoulder +1 more.    Pt wondering what the next best step would be since the injections are not working. Please call and advise

## 2023-09-25 NOTE — Telephone Encounter (Signed)
 I called and spoke with the patient.    The patient had called the office stating she had not gotten a lot of relied from the IR injection received.  The patient states it worked great on the right side, however, the left is her dominant side and the relief was minimal.  The patient was inquiring about what would be the next step for the shoulders.  I had spoken with Dr. Laurell Pond who informed me that the next steps would be surgery.  At this time the patient is not a candidate due to using assistive devises post knee surgery.  I explained this to the patient and she states a complete understanding,    Additionally I asked Dr. Laurell Pond whether he would be willing to prescribe something pain.  He advised the patient follow up with PCP.  Again, this was explained to the patient who had no further questions or concerns at this time.

## 2023-09-25 NOTE — Progress Notes (Signed)
 Patient has been present at all scheduled online google groups for suboxone.  Participates in group discussion.  Denies any cravings or close calls.  Stable on current dose of suboxone.  Otherwise well.  Denies any other c/o.  Chart review complete.

## 2023-09-25 NOTE — Telephone Encounter (Signed)
-----   Message from Salvatore Creeks sent at 09/25/2023  2:12 PM EDT -----  Regarding: Requests callback - med refills, seeking for advice  Cheryl Dixon [id number], 68 year old, female Telephone Information:  Home Phone      [phone]  Work Phone      Not on file.  Mobile          [phone]      Calls today: requesting for a callback from dr.roll, arleigh or RN to discuss about any alternative advice for pt's pain. Says that pt was supposed to receive a call from a nurse but never received callback? Per daughter: pt has tried every over-the-counter meds for pain and pain is persistent. I informed daughter that I cannot guarantee person that returns call to pt will be the provider themselves today. Offered to schedule televisit with arleigh for Monday as a Print production planner, daughter agreed with plan and verbalized understanding.     Person calling on behalf of patient: Daughter    Altamese Ates NUMBER: [phone] (pt's #)  Best time to call back:   Cell phone:   Other phone:    Patient's language of care: English    Patient does not need an interpreter.    Patient's PCP: Trent Frizzle, MD    [facility]:

## 2023-09-25 NOTE — Telephone Encounter (Signed)
 I sent a prescription  Can discuss further on Monday

## 2023-09-25 NOTE — Telephone Encounter (Signed)
 Pt looking for rx for celebrex   Is willing to speak to a.goodwin Monday via tele.  Is wondering if possible to send a few for over weekend  Was on rx in past and it helped

## 2023-09-28 ENCOUNTER — Ambulatory Visit: Admitting: Physician Assistant

## 2023-09-28 ENCOUNTER — Encounter (HOSPITAL_BASED_OUTPATIENT_CLINIC_OR_DEPARTMENT_OTHER): Payer: Self-pay | Admitting: Physician Assistant

## 2023-09-28 ENCOUNTER — Telehealth (HOSPITAL_BASED_OUTPATIENT_CLINIC_OR_DEPARTMENT_OTHER): Payer: Self-pay

## 2023-09-28 DIAGNOSIS — Z96651 Presence of right artificial knee joint: Secondary | ICD-10-CM

## 2023-09-28 DIAGNOSIS — M25511 Pain in right shoulder: Secondary | ICD-10-CM | POA: Diagnosis not present

## 2023-09-28 DIAGNOSIS — G8929 Other chronic pain: Secondary | ICD-10-CM

## 2023-09-28 DIAGNOSIS — M25512 Pain in left shoulder: Secondary | ICD-10-CM | POA: Diagnosis not present

## 2023-09-28 NOTE — Progress Notes (Signed)
 Cheryl Dixon is a 68 year old female pt of Dr. Trent Frizzle, MD who presents for shoulder pain      Shoulder pain  Severe arthritis of shoulders bilaterally  Seen recently by Ortho and received injections in both shoulders  Reports developing back pain maybe 5 days after the injections  Has gotten a bit better since then but still hurts  Wears a sling on left shoulder due to pain  Was referred for home physical therapy and reports this is scheduled  Reports Celebrex  has been helpful for pain in the past and a prescription for this was recently sent, plans to pick up today  Has follow-up with Ortho in about 2 weeks  Has difficulty with showering due to shoulder pains as well as recent knee surgery  Shower chair was ordered and she is waiting for this to arrive  Also reports she recently applied for Meals on Wheels but was denied  Received a letter stating she was denied due to having home physical therapy      Patient Active Problem List:     Opioid use disorder on agonist therapy (HCC)     Tobacco use disorder     Fibrocystic breast     Essential hypertension     Knee pain     Chronic low back pain     OA (osteoarthritis) of knee     H. pylori infection     Major depressive disorder, recurrent episode, severe (HCC)     Occult blood in stools     Prediabetes     Epigastric pain     Chronic obstructive pulmonary disease (HCC)     Left foot pain     Multiple polyps of colon     Acquired deformity of toe, right     Dislocation of metatarsophalangeal joint of lesser toe, right, sequela     Postmenopausal atrophic vaginitis     Abnormal loss of weight     Aneurysm of ascending aorta without rupture (HCC)     COVID-19 virus infection     BMI 32.0-32.9,adult     Chronic dysuria     LLQ pain     Osteoporosis     History of endometriosis     Lung cancer screening     Constipation, drug induced     Weakness     Cerebrovascular accident (CVA) (HCC)     Overactive bladder      [facility] [phone] Option  #4     Substance addiction (HCC)     Anxiety     Obese     S/P total knee arthroplasty, right     Patellar tendon rupture, right, initial encounter     Status post total right knee replacement     Rupture patellar tendon, right, subsequent encounter     Patellar tendon rupture, right, sequela     S/P revision of total knee, right     Gait instability     Pain of right lower extremity    celecoxib  (CELEBREX ) 200 MG capsule, Take 1 capsule by mouth daily, Disp: 30 capsule, Rfl: 0  clonazePAM  (KLONOPIN ) 0.5 MG tablet, Take 1 tablet by mouth 2 (two) times daily as needed for Anxiety  for up to 14 days, Disp: 14 tablet, Rfl: 1  buprenorphine -naloxone  (SUBOXONE ) 8-2 MG sublingual film, Take 1.5 films under the tongue in the morning, 1 film midday, and 1 film at night, Disp: 98 Film, Rfl: 0  hydrOXYzine  (ATARAX ) 25  MG tablet, Take 1 tablet by mouth nightly as needed for Itching (Patient not taking: Reported on 09/08/2023), Disp: 30 tablet, Rfl: 0  solifenacin  (VESICARE ) 10 MG tablet, TAKE 1 TABLET BY MOUTH EVERY DAY IN THE MORNING (Patient not taking: Reported on 09/08/2023), Disp: 90 tablet, Rfl: 0  OTHER MEDICATION, SHOWER CHAIR  For use as directed.    DX: gait instability, OA knee  ICD10:R26.81, M17.9 (Patient not taking: Reported on 09/08/2023), Disp: 1 each, Rfl: 0  calcium  carb-cholecalciferol  500-10 MG-MCG TABS tablet, Take 1 tablet by mouth in the morning and 1 tablet before bedtime., Disp: 180 tablet, Rfl: 3  magnesium  oxide (MAG-OX) 400 (240 Mg) MG tablet, Take 1 tablet by mouth daily, Disp: 90 tablet, Rfl: 3  acetaminophen  (TYLENOL ) 500 MG tablet, Take 1 tablet by mouth every 8 (eight) hours as needed for Pain, Disp: 549 tablet, Rfl: 0  aspirin  (ASPIRIN  81) 81 MG chewable tablet, Take 1 tablet by mouth in the morning and 1 tablet before bedtime., Disp: , Rfl:   estradiol  (ESTRACE ) 0.1 MG/GM vaginal cream, PLACE 5 G VAGINALLY 3 TIMES A WEEK MYLAN BRAND ONLY., Disp: 42.5 g, Rfl: 5  Cholecalciferol  (VITAMIN D3) 50  MCG (2000 UT) TABS, TAKE 1 TABLET BY MOUTH EVERY DAY IN THE MORNING, Disp: 90 tablet, Rfl: 3  famotidine  (PEPCID ) 20 MG tablet, TAKE 1 TABLET BY MOUTH IN THE MORNING AND BEFORE BEDTIME (Patient not taking: Reported on 09/08/2023), Disp: 180 tablet, Rfl: 0  atorvastatin  (LIPITOR) 80 MG tablet, Take 1 tablet by mouth daily after dinner, Disp: 90 tablet, Rfl: 3  polyethylene glycol (GLYCOLAX /MIRALAX ) 17 g packet, Take 1 packet by mouth daily, Disp: 90 packet, Rfl: 3  albuterol  HFA 108 (90 Base) MCG/ACT inhaler, Inhale 2 puffs into the lungs every 6 (six) hours as needed for Wheezing, Disp: , Rfl:   docusate sodium  (COLACE) 100 MG capsule, TAKE 1 CAPSULE BY MOUTH IN THE MORNING AND BEFORE BEDTIME, Disp: 180 capsule, Rfl: 3  tiotropium-olodaterol (STIOLTO RESPIMAT ) 2.5-2.5 MCG/ACT inhal, Inhale 2 puffs into the lungs daily (Patient not taking: Reported on 09/08/2023), Disp: , Rfl:   buPROPion  (WELLBUTRIN  SR) 200 MG 12 hr tablet, TAKE 1 TABLET BY MOUTH IN THE MORNING AND BEFORE BEDTIME, Disp: 180 tablet, Rfl: 3  cetirizine  (ZYRTEC ) 10 MG tablet, TAKE 1 TABLET BY MOUTH EVERY DAY IN THE MORNING, Disp: 90 tablet, Rfl: 3    No current facility-administered medications on file prior to visit.    Review of Patient's Allergies indicates:   Darvon                     Meperidine hcl              Comment:Nausea/vomit   Paxil [paroxetine]      Rash   Zoloft  [sertraline  *    Rash      Exam  Pulmonary - speaking full sentences        ASSESSMENT/PLAN        (M25.511,  G89.29,  M25.512) Chronic pain of both shoulders  (primary encounter diagnosis)  (M84.132) S/P revision of total knee, right  Comment: Patient recently seen by Ortho, has severe arthritis of both shoulders.  Received steroid injections and reports significant pain about 5 days after receiving them.  Pain has subsided a bit but still hurts.  We discussed that it is possible to initially get pain after steroid injection but it seems to be unusual it would happen so many days  later.  She asked about pain management and we reviewed that she has a prescription for Celebrex .  Will also start home physical therapy soon and has follow-up with orthopedics in 2 weeks.  Needs a shower chair due to arm pains and recent knee surgery.  I reviewed that prescription has been sent and per her note in the chart it was forwarded to supply company.  Unfortunately per patient she was denied Meals on Wheels, per patient letter states she was denied due to having physical therapy and home  Plan: Celebrex  and home physical therapy for shoulder pains           Follow-up with Ortho as planned for reassessment           Gave a number of medical supply company who received order for shower chair so she can follow-up            Encouraged to discuss Meals on Wheels with physical therapy provider to see if they have any similar resource she can use while she is being seen at home      Medication question  States she is out of clonazepam , wondering why she only gets 1 week at a time  We reviewed that she get a prescription for 1 week with 1 refill  States she was not aware of refill and her daughter will contact the pharmacy to see if it can be filled

## 2023-09-28 NOTE — Telephone Encounter (Signed)
 Tammy w/ [facility] called requesting PT and Skilled Nursing, per PT request. Please send referrals.    [facility]   Provider # [id number]  Ph [phone]  Fax [phone]

## 2023-10-02 ENCOUNTER — Encounter (HOSPITAL_BASED_OUTPATIENT_CLINIC_OR_DEPARTMENT_OTHER): Payer: Self-pay

## 2023-10-02 NOTE — Progress Notes (Unsigned)
 Ext PT referral placed for pt.     [facility] declined, d/t staffing shortage    [facility] declined, says they have recently seen the pt and discharged her. I told them that the pt was in cast during their care and she is out of cast now, and therefore would need another course of PT. The referral is also for the L. Shoulder.   They asked me to fax over the latest visit note. I did. I am waiting on their answer.      [facility] declined this pt for PT services because they do not offer PT services.    [facility] declined this pt for PT services d/t staffing shortage.

## 2023-10-06 ENCOUNTER — Ambulatory Visit: Admitting: Clinical

## 2023-10-06 ENCOUNTER — Ambulatory Visit: Admitting: Family Medicine

## 2023-10-06 ENCOUNTER — Other Ambulatory Visit (HOSPITAL_BASED_OUTPATIENT_CLINIC_OR_DEPARTMENT_OTHER): Payer: Self-pay | Admitting: Family Medicine

## 2023-10-06 DIAGNOSIS — F1120 Opioid dependence, uncomplicated: Secondary | ICD-10-CM

## 2023-10-06 MED ORDER — BUPRENORPHINE HCL-NALOXONE HCL 8-2 MG SL FILM
ORAL_FILM | SUBLINGUAL | 0 refills | Status: DC
Start: 2023-10-06 — End: 2023-11-03

## 2023-10-07 ENCOUNTER — Other Ambulatory Visit (HOSPITAL_BASED_OUTPATIENT_CLINIC_OR_DEPARTMENT_OTHER): Payer: Self-pay

## 2023-10-07 ENCOUNTER — Other Ambulatory Visit (HOSPITAL_BASED_OUTPATIENT_CLINIC_OR_DEPARTMENT_OTHER): Payer: Self-pay | Admitting: Physician Assistant

## 2023-10-07 DIAGNOSIS — M19012 Primary osteoarthritis, left shoulder: Secondary | ICD-10-CM

## 2023-10-07 DIAGNOSIS — M19011 Primary osteoarthritis, right shoulder: Secondary | ICD-10-CM

## 2023-10-07 DIAGNOSIS — Z96651 Presence of right artificial knee joint: Secondary | ICD-10-CM

## 2023-10-07 NOTE — Progress Notes (Signed)
 Spoke with Cheryl's daughter Loyce Ruffini to inform her of my attempts to place Dixon for Cheryl- please see previous note. I made at least 3 attempts to place the Cheryl for Cheryl but in vain. The daughter was unhappy to hear this, says we have neglected her Dixon since her surgery She requested that if the patient is to come in for out patient Cheryl, she would like for it to be at [facility]. I am forwarding this message to Abby L, PA-C

## 2023-10-08 ENCOUNTER — Ambulatory Visit: Admitting: Internal Medicine

## 2023-10-09 ENCOUNTER — Other Ambulatory Visit: Payer: Self-pay

## 2023-10-09 ENCOUNTER — Ambulatory Visit: Attending: Orthopaedic Surgery | Admitting: Orthopaedic Surgery

## 2023-10-09 DIAGNOSIS — M19011 Primary osteoarthritis, right shoulder: Secondary | ICD-10-CM | POA: Insufficient documentation

## 2023-10-09 DIAGNOSIS — M19012 Primary osteoarthritis, left shoulder: Secondary | ICD-10-CM | POA: Insufficient documentation

## 2023-10-09 MED ORDER — CYCLOBENZAPRINE HCL 5 MG PO TABS
5.0000 mg | ORAL_TABLET | Freq: Two times a day (BID) | ORAL | 1 refills | Status: DC | PRN
Start: 2023-10-09 — End: 2023-10-19

## 2023-10-09 NOTE — Progress Notes (Signed)
 Date of service: 10/09/2023    This is a 68 year old year old female who returns for follow up of her bilateral shoulder pain.    I just saw her a month ago for her severe bilateral shoulder arthritis.  She did subsequently have bilateral corticosteroid injections in radiology.  It did help with her right shoulder quite well.  Unfortunately a little over a week ago she had significant increased pain in the left shoulder and more difficulty moving.  She was seen at [facility] emergency room.  She states she was given Valium  which did help a little bit as she was also having pain up into her trapezius.    Physical examination:   General - the patient is alert and oriented x3, well appearing, in no acute distress.  Normal affect and mood.  Left shoulder active elevation 30, passively 50 due to pain and guarding.  Further examination limited due to pain.  Strength 5 out of 5.  Neurovascular intact         Assessement: Bilateral shoulder glenohumeral arthritis..    Plan: I reviewed the findings with the patient.  Unfortunately she has had increased pain in her left shoulder certainly due to the underlying arthritis but also more significant stiffness.  The injection cannot be repeated at this time as it is too soon.  She may continue with her other modalities and I did prescribe her some Flexeril  to help with the muscle spasm component of this and reviewed precautions in taking this as it may cause sedation.  Otherwise she may follow-up with me as needed.  She is not unfortunately at a place where she could proceed with shoulder arthroplasty.  All questions were answered.    This note was prepared using voice recognition software.  Please disregard any transcription errors.

## 2023-10-10 ENCOUNTER — Emergency Department
Admission: EM | Admit: 2023-10-10 | Discharge: 2023-10-10 | Disposition: A | Discharge status: Home / Self Care | Attending: Emergency Medicine | Admitting: Emergency Medicine

## 2023-10-10 ENCOUNTER — Emergency Department (HOSPITAL_BASED_OUTPATIENT_CLINIC_OR_DEPARTMENT_OTHER)

## 2023-10-10 ENCOUNTER — Other Ambulatory Visit: Payer: Self-pay

## 2023-10-10 DIAGNOSIS — W1809XA Striking against other object with subsequent fall, initial encounter: Secondary | ICD-10-CM | POA: Diagnosis not present

## 2023-10-10 DIAGNOSIS — M25562 Pain in left knee: Secondary | ICD-10-CM

## 2023-10-10 DIAGNOSIS — S0512XA Contusion of eyeball and orbital tissues, left eye, initial encounter: Secondary | ICD-10-CM | POA: Diagnosis not present

## 2023-10-10 DIAGNOSIS — W19XXXA Unspecified fall, initial encounter: Secondary | ICD-10-CM | POA: Diagnosis not present

## 2023-10-10 DIAGNOSIS — S0990XA Unspecified injury of head, initial encounter: Secondary | ICD-10-CM | POA: Diagnosis present

## 2023-10-10 DIAGNOSIS — R220 Localized swelling, mass and lump, head: Secondary | ICD-10-CM | POA: Diagnosis not present

## 2023-10-10 DIAGNOSIS — W010XXA Fall on same level from slipping, tripping and stumbling without subsequent striking against object, initial encounter: Secondary | ICD-10-CM

## 2023-10-10 MED ORDER — CALCIUM CARBONATE ANTACID 500 MG PO CHEW
500.0000 mg | CHEWABLE_TABLET | Freq: Once | ORAL | Status: AC
Start: 2023-10-10 — End: 2023-10-10
  Administered 2023-10-10: 500 mg via ORAL
  Filled 2023-10-10: qty 1

## 2023-10-10 MED ORDER — ACETAMINOPHEN 500 MG PO TABS
1000.0000 mg | ORAL_TABLET | Freq: Once | ORAL | Status: AC
Start: 2023-10-10 — End: 2023-10-10
  Administered 2023-10-10: 1000 mg via ORAL
  Filled 2023-10-10: qty 2

## 2023-10-10 MED ORDER — LIDOCAINE 4 % EX PTCH
1.0000 | MEDICATED_PATCH | Freq: Once | CUTANEOUS | Status: DC
Start: 2023-10-10 — End: 2023-10-11
  Administered 2023-10-10: 1 via TOPICAL
  Filled 2023-10-10: qty 1

## 2023-10-10 NOTE — Narrator Note (Signed)
 Patient Disposition  Patient education for diagnosis, medications, activity, diet and follow-up.  Patient left ED 8:51 PM.  Patient rep received written instructions.    Interpreter to provide instructions: No    Patient belongings with patient: YES    Have all existing LDAs been addressed? N/A    Have all IV infusions been stopped? N/A    Destination: Discharged to home

## 2023-10-10 NOTE — ED Notes (Signed)
 Bed: 07-A  Expected date:   Expected time:   Means of arrival:   Comments:  EMS

## 2023-10-10 NOTE — Discharge Instructions (Signed)
 You are seen in the emergency department for a fall.  Here your CAT scan of the head and face do not show any new fractures.  Your knee does not show any fracture.  Please continue your Tylenol  as needed you can also take ibuprofen  if you are not on blood thinners.

## 2023-10-10 NOTE — ED Triage Note (Addendum)
 Pt arrived via BLS ambulance for evaluation of head strike after mechanical trip and fall. Recently had right knee replacement. Hematoma on left brow with abrasion. Swelling noted to the left knee. Maintained in c-collar from EMS, denies LOC. Reports she recently stopped taking baby aspirin .

## 2023-10-11 NOTE — ED Notes (Signed)
 ED Radiology Discrepancy Note    Discrepancy Review Reveals:  Missing wet read   Radiology Reads:  XR Knee Left 3 views   Final Result    Exam: Left knee, 3 views        Indication: pain, fall        Comparison: 06/14/2021        Findings:        Bones and Joints: There is no fracture or suspicious bone lesion. There is     no dislocation. There is moderate to severe medial joint compartment     narrowing and moderate patellofemoral joint space and lateral joint     compartment narrowing. No joint effusion is seen.         Soft Tissues: Unremarkable.          IMPRESSION:         No acute fractures of the left knee.    Moderate to severe degenerative changes of the left knee.            Reviewed and Electronically Signed By: Serita Danes, MD    Signed Date and Time: 10/11/2023 8:07 AM         CT Head WO Contrast   Final Result    PROCEDURE INFORMATION:     Exam: CT Head Without Contrast     Exam date and time: 10/10/2023 7:27 PM     Age: 68 years old     Clinical indication: Injury or trauma; Fall; Blunt trauma (contusions or     hematomas); Consciousness not specified; Additional info: Fall hs         TECHNIQUE:     Imaging protocol: Computed tomography of the head without contrast.     Radiation optimization: All CT scans at this facility use at least one of     these     dose optimization techniques: automated exposure control; [place] and/or kV     adjustment per patient size (includes targeted exams where dose is matched     to     clinical indication); or iterative reconstruction.         COMPARISON:     MRI Brain WO Contrast 12/13/2021 9:53 AM         FINDINGS:     Brain: There is no area of intraparenchymal or extra-axial hemorrhage     present.     There is no focal mass. There is no midline shift. The gray-white matter     junction is intact.     Cerebral ventricles: No ventriculomegaly.     Paranasal sinuses: Visualized sinuses are unremarkable. No fluid levels.     Mastoid air cells: Visualized mastoid air cells  are well aerated.     Bones: Unremarkable. No acute fracture.     Soft tissues: Left forehead scalp soft tissue swelling.             IMPRESSION:     1.   Left forehead scalp soft tissue swelling.     2.   Otherwise unremarkable examination of the brain. There is no acute     intracranial abnormality seen.         THIS DOCUMENT HAS BEEN ELECTRONICALLY SIGNED BY [facility] RADIOLOGIST Steen Eden, MD     CT Facial WO Contrast   Final Result    PROCEDURE INFORMATION:     Exam: CT Maxillofacial Without Contrast     Exam date and time: 10/10/2023 7:28 PM  Age: 67 years old     Clinical indication: Injury or trauma; Fall; Blunt trauma (contusions or     hematomas); Orbit/periorbital; Left; Additional info: Fall hs L eye     bruising     edema         TECHNIQUE:     Imaging protocol: Computed tomography of the face without contrast.     Radiation optimization: All CT scans at this facility use at least one of     these     dose optimization techniques: automated exposure control; [place] and/or kV     adjustment per patient size (includes targeted exams where dose is matched     to     clinical indication); or iterative reconstruction.         COMPARISON:     CT Head WO Contrast 10/10/2023 7:27 PM         FINDINGS:     Paranasal sinuses: Normal. No air-fluid levels.      Orbital cavities: No acute fracture. No retrobulbar hematoma. Globes are     unremarkable. No inflamation.     Bones: No acute fracture.     Soft tissues:  Left periorbital soft tissue swelling.             IMPRESSION:     No evidence for significant traumatic injury to the facial bones or     orbits.         THIS DOCUMENT HAS BEEN ELECTRONICALLY SIGNED BY [facility] RADIOLOGIST Steen Eden, MD      Management:  No change in management        Dariel Edelson, MD

## 2023-10-12 ENCOUNTER — Ambulatory Visit (HOSPITAL_BASED_OUTPATIENT_CLINIC_OR_DEPARTMENT_OTHER): Payer: Self-pay

## 2023-10-12 ENCOUNTER — Ambulatory Visit: Admitting: Orthopaedic Surgery

## 2023-10-12 ENCOUNTER — Telehealth (HOSPITAL_BASED_OUTPATIENT_CLINIC_OR_DEPARTMENT_OTHER): Payer: Self-pay | Admitting: Internal Medicine

## 2023-10-12 ENCOUNTER — Other Ambulatory Visit (HOSPITAL_BASED_OUTPATIENT_CLINIC_OR_DEPARTMENT_OTHER): Payer: Self-pay | Admitting: Physician Assistant

## 2023-10-12 ENCOUNTER — Ambulatory Visit (HOSPITAL_BASED_OUTPATIENT_CLINIC_OR_DEPARTMENT_OTHER): Admitting: Orthopaedic Surgery

## 2023-10-12 DIAGNOSIS — Z96651 Presence of right artificial knee joint: Secondary | ICD-10-CM

## 2023-10-12 NOTE — Telephone Encounter (Signed)
 PER Patient, Cheryl Dixon is a 68 year old female has requested a refill of Clonazepam .      Last prescribed 09/17/23 - 10/09/23    Last Office Visit  : 08/25/2023 Saint Cranker, PA-C      Last Tele Visit:  09/28/2023      Last Physical Exam:  05/27/2013     There are no preventive care reminders to display for this patient.    Other Med Adult:  Most Recent BP Reading(s)  10/10/23 : 143/100        Cholesterol (mg/dL)   Date Value   16/01/9603 212     LOW DENSITY LIPOPROTEIN DIRECT (mg/dL)   Date Value   54/12/8117 151     HIGH DENSITY LIPOPROTEIN (mg/dL)   Date Value   14/78/2956 47     TRIGLYCERIDES (mg/dL)   Date Value   21/30/8657 119         THYROID  SCREEN TSH REFLEX FT4 (uIU/mL)   Date Value   08/20/2022 4.100         No results found for: TSH    HEMOGLOBIN A1C (%)   Date Value   08/20/2022 5.6       No results found for: POCA1C      INR (no units)   Date Value   12/12/2021 1.1   02/16/2007 1.0 (L)   07/03/2006 < 1.0 (L)       SODIUM (mmol/L)   Date Value   06/25/2023 140       POTASSIUM (mmol/L)   Date Value   06/25/2023 4.0           CREATININE (mg/dL)   Date Value   84/69/6295 0.8       Documented patient preferred pharmacies:    [id number] - [place], [place] - [address]  Phone: [phone] Fax: [phone]

## 2023-10-12 NOTE — Telephone Encounter (Signed)
 Adult General Triage    I spoke with Patient:     Chief Complaint: fall with head strike, ED f/u       Current Day of Symptoms: 2     Patient Active Problem List:     Opioid use disorder on agonist therapy (HCC)     Tobacco use disorder     Fibrocystic breast     Essential hypertension     Knee pain     Chronic low back pain     OA (osteoarthritis) of knee     H. pylori infection     Major depressive disorder, recurrent episode, severe (HCC)     Occult blood in stools     Prediabetes     Epigastric pain     Chronic obstructive pulmonary disease (HCC)     Left foot pain     Multiple polyps of colon     Acquired deformity of toe, right     Dislocation of metatarsophalangeal joint of lesser toe, right, sequela     Postmenopausal atrophic vaginitis     Abnormal loss of weight     Aneurysm of ascending aorta without rupture (HCC)     COVID-19 virus infection     BMI 32.0-32.9,adult     Chronic dysuria     LLQ pain     Osteoporosis     History of endometriosis     Lung cancer screening     Constipation, drug induced     Weakness     Cerebrovascular accident (CVA) (HCC)     Overactive bladder      [facility] [phone] Option #4     Substance addiction (HCC)     Anxiety     Obese     S/P total knee arthroplasty, right     Patellar tendon rupture, right, initial encounter     Status post total right knee replacement     Rupture patellar tendon, right, subsequent encounter     Patellar tendon rupture, right, sequela     S/P revision of total knee, right     Gait instability     Pain of right lower extremity      Review of Patient's Allergies indicates:   Darvon                     Meperidine hcl              Comment:Nausea/vomit   Paxil [paroxetine]      Rash   Zoloft  [sertraline  *    Rash    Emergency care:     Loss of Consciousness:  No     Chest Pain:  No     Severe Difficulty Breathing:  No     Concern for life threatening condition:  No     The patient has the following symptoms:     Pt had a  fall and hit her eye/head on the side of a table.  Went to ED and had reassuring imaging.  She has had some memory issue since the fall and would like f/u.     Pregnancy:  N/A     Home Remedies:  Tylenol      Advised per nursing triage protocol.         Recommended disposition for patient:  Disposition: See in Office Today or tomorrow.  Scheduled at a time convenient for her.    If patient referred to UC/ED advised that they may require  further follow up and testing after the visit with their primary care office.     Instructed patient to call back for any new, worsening, or worrisome symptoms or concerns any time day or night.    Advised per nursing triage protocol.     Telephone Call Outcome:  Single Call Resolution        Reason for Disposition   [1] Concussion symptoms staying SAME (not worse or better) AND [2] age > 60 years    Additional Information   Commented on: Diagnosed with a concussion within last 14 days     Not diagnosed    Protocols used: Recent Medical Visit for Injury Follow-up Call-[facility], Concussion (mTBI) Less Than 14 Days Ago Follow-up Call-[facility]

## 2023-10-13 MED ORDER — CLONAZEPAM 0.5 MG PO TABS
0.5000 mg | ORAL_TABLET | Freq: Two times a day (BID) | ORAL | 1 refills | Status: DC | PRN
Start: 2023-10-13 — End: 2023-10-27

## 2023-10-13 NOTE — Telephone Encounter (Signed)
Left vm for pt to call rcc back.

## 2023-10-13 NOTE — Telephone Encounter (Signed)
 Per review patient was seen in the [facility] ED earlier this month for shoulder pain and prescribed diazepam .  Please let her know she should not combine this with clonazepam 

## 2023-10-13 NOTE — Telephone Encounter (Signed)
 Patient is out of medication

## 2023-10-14 ENCOUNTER — Other Ambulatory Visit: Payer: Self-pay

## 2023-10-14 ENCOUNTER — Other Ambulatory Visit (HOSPITAL_BASED_OUTPATIENT_CLINIC_OR_DEPARTMENT_OTHER): Payer: Self-pay | Admitting: Internal Medicine

## 2023-10-14 ENCOUNTER — Encounter (HOSPITAL_BASED_OUTPATIENT_CLINIC_OR_DEPARTMENT_OTHER): Payer: Self-pay | Admitting: Physician Assistant

## 2023-10-14 ENCOUNTER — Ambulatory Visit: Attending: Internal Medicine | Admitting: Physician Assistant

## 2023-10-14 VITALS — BP 126/79 | HR 70 | Temp 98.1°F

## 2023-10-14 DIAGNOSIS — W19XXXD Unspecified fall, subsequent encounter: Secondary | ICD-10-CM | POA: Diagnosis not present

## 2023-10-14 DIAGNOSIS — M25512 Pain in left shoulder: Secondary | ICD-10-CM | POA: Diagnosis present

## 2023-10-14 DIAGNOSIS — M25511 Pain in right shoulder: Secondary | ICD-10-CM | POA: Insufficient documentation

## 2023-10-14 DIAGNOSIS — G8929 Other chronic pain: Secondary | ICD-10-CM | POA: Insufficient documentation

## 2023-10-14 DIAGNOSIS — S060X0D Concussion without loss of consciousness, subsequent encounter: Secondary | ICD-10-CM | POA: Diagnosis present

## 2023-10-14 DIAGNOSIS — S0083XD Contusion of other part of head, subsequent encounter: Secondary | ICD-10-CM | POA: Insufficient documentation

## 2023-10-14 DIAGNOSIS — Z96651 Presence of right artificial knee joint: Secondary | ICD-10-CM | POA: Insufficient documentation

## 2023-10-14 MED ORDER — CYCLOBENZAPRINE HCL 5 MG PO TABS
5.0000 mg | ORAL_TABLET | Freq: Two times a day (BID) | ORAL | 0 refills | Status: DC | PRN
Start: 2023-10-14 — End: 2023-11-13

## 2023-10-14 NOTE — Telephone Encounter (Signed)
 PER Pharmacy, Cheryl Dixon is a 68 year old female has requested a refill of     hydrOXYzine  (ATARAX ) 25 MG tablet       Last OFFICE/TELE Visit:  10/14/23 with Hayes Lipps      Last Physical Exam:   05/27/2013     There are no preventive care reminders to display for this patient.    Other Med Adult:  Most Recent BP Reading(s)  10/14/23 : 126/79        Cholesterol (mg/dL)   Date Value   16/01/9603 212     LOW DENSITY LIPOPROTEIN DIRECT (mg/dL)   Date Value   54/12/8117 151     HIGH DENSITY LIPOPROTEIN (mg/dL)   Date Value   14/78/2956 47     TRIGLYCERIDES (mg/dL)   Date Value   21/30/8657 119         THYROID  SCREEN TSH REFLEX FT4 (uIU/mL)   Date Value   08/20/2022 4.100         No results found for: TSH    HEMOGLOBIN A1C (%)   Date Value   08/20/2022 5.6       No results found for: POCA1C      INR (no units)   Date Value   12/12/2021 1.1   02/16/2007 1.0 (L)   07/03/2006 < 1.0 (L)       SODIUM (mmol/L)   Date Value   06/25/2023 140       POTASSIUM (mmol/L)   Date Value   06/25/2023 4.0           CREATININE (mg/dL)   Date Value   84/69/6295 0.8       Documented patient preferred pharmacies:    [id number] - [place], [place] - [address]  Phone: [phone] Fax: [phone]

## 2023-10-14 NOTE — Progress Notes (Signed)
 Cheryl Dixon is a 68 year old female pt of Dr. Trent Frizzle, MD who presents for f/u fall      Fall  At home 4 days ago  Was standing up and tried to put her shoe on her right foot  Has a Bledsoe brace locked in full extension due to to previous knee surgery  Due to this she is lost her balance  Marvell Slider forward and hit her head on a table  No LOC  Called an ambulance and seen in the ED  Had CT head and face without acute injuries  X-ray left knee with no fracture  Since fall has mild pain around left orbit where she struck her face  Noticing her memory is a bit poor and having trouble focusing  Known OA of shoulders, a little worse since fall  Taking Flexeril  prescribed by Ortho which helps  Denies N/V, vision change      BP 126/79   Pulse 70   Temp 98.1 F (36.7 C) (Temporal)   LMP 11/20/1991   SpO2 97%   Pain Score: Data Unavailable  ,          Physical Exam  Vitals reviewed.   Constitutional:       General: She is not in acute distress.     Appearance: Normal appearance.   HENT:      Head: Normocephalic.      Comments: Large bruise with some swelling left orbit  Neurological:      Mental Status: She is alert.         ASSESSMENT/PLAN        (S06.0X0D) Concussion without loss of consciousness, subsequent encounter  (primary encounter diagnosis)  (S00.83XD) Contusion of face, subsequent encounter  (W19.XXXD) Fall, subsequent encounter  (M25.511,  G89.29,  M25.512) Chronic pain of both shoulders  Comment: Had a fall at home 4 days ago.  Was standing and trying to put a shoe on her right foot on which she wears a Bledsoe brace locked in full extension.  Lost her balance and fell forward hitting a table.  Seen in ED, imaging without evidence of acute head or neck injury.  Now having mild pain around orbit where she has suffered a contusion.  Also some memory problems and trouble focusing consistent with some mild concussion.  Plan: cyclobenzaprine  (FLEXERIL ) 5 MG tablet        Discussed rest to help with concussion  symptoms        Would generally expect symptoms to resolve fairly quickly discussed that can vary person-to-person        Continue Flexeril  which is helpful for shoulder OA, warned not to combine with Klonopin  due to sedation        Advise care at home with moving around giving her current knee brace, encouraged to ask family for assistance        (Y60.630) S/P revision of total knee, right  Comment: Has brace on knee locked in full extension is noted.  Per patient has not recently had physical therapy.  Order was placed for at home PT but patient states she never heard about this.  Another order was placed about a week ago by orthopedics for outpatient PT, and per text of the referral they were unable to find [facility] to provide home PT.  Patient is agreeable to doing therapy outpatient  Plan: Gave number to call and schedule PT

## 2023-10-15 ENCOUNTER — Other Ambulatory Visit (HOSPITAL_BASED_OUTPATIENT_CLINIC_OR_DEPARTMENT_OTHER): Payer: Self-pay | Admitting: Internal Medicine

## 2023-10-15 DIAGNOSIS — J441 Chronic obstructive pulmonary disease with (acute) exacerbation: Secondary | ICD-10-CM

## 2023-10-15 DIAGNOSIS — R051 Acute cough: Secondary | ICD-10-CM

## 2023-10-15 NOTE — Telephone Encounter (Signed)
 PER Pharmacy, Cheryl Dixon is a 68 year old female has requested a refill of cetirizine  (ZYRTEC ) 10 MG tablet .      Last OFFICE/TELE Visit:  10/14/23 with [name]      Last Physical Exam:   05/27/2013     There are no preventive care reminders to display for this patient.    Other Med Adult:  Most Recent BP Reading(s)  10/14/23 : 126/79        Cholesterol (mg/dL)   Date Value   09/81/1914 212     LOW DENSITY LIPOPROTEIN DIRECT (mg/dL)   Date Value   78/29/5621 151     HIGH DENSITY LIPOPROTEIN (mg/dL)   Date Value   30/86/5784 47     TRIGLYCERIDES (mg/dL)   Date Value   69/62/9528 119         THYROID  SCREEN TSH REFLEX FT4 (uIU/mL)   Date Value   08/20/2022 4.100         No results found for: TSH    HEMOGLOBIN A1C (%)   Date Value   08/20/2022 5.6       No results found for: POCA1C      INR (no units)   Date Value   12/12/2021 1.1   02/16/2007 1.0 (L)   07/03/2006 < 1.0 (L)       SODIUM (mmol/L)   Date Value   06/25/2023 140       POTASSIUM (mmol/L)   Date Value   06/25/2023 4.0           CREATININE (mg/dL)   Date Value   41/32/4401 0.8       Documented patient preferred pharmacies:    [id number] - [place], [place] - [address]  Phone: [phone] Fax: [phone]

## 2023-10-20 ENCOUNTER — Ambulatory Visit: Payer: No Typology Code available for payment source | Admitting: Family Medicine

## 2023-10-21 NOTE — Telephone Encounter (Signed)
 Discussed at OV with PA Jolaine 10/14/23.

## 2023-10-21 NOTE — Telephone Encounter (Signed)
Left vm for pt to call rcc back.

## 2023-10-23 NOTE — ED Provider Notes (Signed)
 [facility] EMERGENCY VISIT NOTE       Triage Note and Vitals     Triage Documentation       Dwaine Browning, RN 10/10/2023 17:57                 Pt arrived via BLS ambulance for evaluation of head strike after mechanical trip and fall. Recently had right knee replacement. Hematoma on left brow with abrasion. Swelling noted to the left knee. Maintained in c-collar from EMS, denies LOC. Reports she recently stopped taking baby aspirin .       Original note by Dwaine Browning, RN at 10/10/2023 17:56                  ED Triage Vitals   ED Triage Vitals Brief Group      Temp 10/10/23 1754 98.3 F      Pulse 10/10/23 1754 80      Resp 10/10/23 1754 18      BP 10/10/23 1757 (!) 173/106      SpO2 10/10/23 1754 97 %      Pain Score 10/10/23 1754 7       Arrived to the ED by: [facility]    Past Medical History       has a past medical history of Anxiety, Arthritis, COPD (chronic obstructive pulmonary disease) (HCC), Depression, Obese, Stroke (HCC), Substance addiction (HCC), and Varicose veins of lower extremities.    She has no past medical history of Adverse effect of anesthesia, Malignant hyperthermia, PONV (postoperative nausea and vomiting), or Sleep apnea.  Patient Active Problem List:     Opioid use disorder on agonist therapy (HCC)     Tobacco use disorder     Fibrocystic breast     Essential hypertension     Knee pain     Chronic low back pain     OA (osteoarthritis) of knee     H. pylori infection     Major depressive disorder, recurrent episode, severe (HCC)     Occult blood in stools     Prediabetes     Epigastric pain     Chronic obstructive pulmonary disease (HCC)     Left foot pain     Multiple polyps of colon     Acquired deformity of toe, right     Dislocation of metatarsophalangeal joint of lesser toe, right, sequela     Postmenopausal atrophic vaginitis     Abnormal loss of weight     Aneurysm of ascending aorta without rupture (HCC)     COVID-19 virus infection     BMI 32.0-32.9,adult     Chronic dysuria      LLQ pain     Osteoporosis     History of endometriosis     Lung cancer screening     Constipation, drug induced     Weakness     Cerebrovascular accident (CVA) (HCC)     Overactive bladder      [facility] [phone] Option #4     Substance addiction (HCC)     Anxiety     Obese     S/P total knee arthroplasty, right     Patellar tendon rupture, right, initial encounter     Status post total right knee replacement     Rupture patellar tendon, right, subsequent encounter     Patellar tendon rupture, right, sequela     S/P revision of total knee, right     Gait instability  Pain of right lower extremity      ED Visit Note     History provided by: Patient   Current medications/prescriptions reviewed.  Past medical visits, history and external records reviewed:     68 year old F recently hx of R knee replacement presenting here after mechanical fall after tripping. Felt normal prior to fall with no palps/CP/SOB, no recent fever or illness. Now w hematoma on left brow , pain of the head and left knee. +HS, no LOC. No R knee pain. BIBEMS.     BP 143/100   Pulse 63   Temp 98.3 F   Resp 16   LMP 11/20/1991   SpO2 97%   On my exam, vitals are within normal limits  Head iw hematoma.abrasion of the left brow.  Face atraumatic.   No raccoon eyes, Battle sign.  Normal extraocular movements.  Equal pupils.  CTL spine without any tenderness to palpation.   Chest wall and abdomen without tenderness palpation.   Lungs w b/l BS  All extremities ranged without any bony tenderness to palpation or pain with range of movement other than L knee which is tender, edematous. Slight pain with Rom otherwise no focal TTP.    Body warm well-perfused.   Skin with  No wounds or lacerations    Neuroexam nonfocal, alert and oriented, cranial nerves grossly intact, moving all extremities equally.    68 year old F recent R knee replacement here w mechanical fall, HS, HA, L knee pain.reassuring neuro exam and trauma  survey other than head hematoma, L knee edema.  CTH, CT face, L knee with no emergent findings. Analgesia given w improved pain. Pt ambulatory with no difficulty. Plan for PCP follow up     ED Course as of 10/23/23 1627   Sat Oct 10, 2023   2034 CT head and facial CT show no fractures or intracranial bleeding.         Treatment significantly limited by the following social determinants of health:   CC time - none additional     Please be aware the text of this note was written with medical jargon meant to communicate with other clinicians. Voice recognition software was used to create this note. Please excuse any typos.     ED Medications     Given in the ER:  Medications   acetaminophen  (TYLENOL ) tablet 1,000 mg (1,000 mg Oral Given 10/10/23 1917)   calcium  carbonate (TUMS) chewable tablet 500 mg (500 mg Oral Given 10/10/23 2050)       Given in the ER and written for discharge:  Orders Placed This Encounter      acetaminophen  (TYLENOL ) tablet 1,000 mg      calcium  carbonate (TUMS) chewable tablet 500 mg      ED Labs and Imaging      XR Knee Left 3 views   Final Result    Exam: Left knee, 3 views        Indication: pain, fall        Comparison: 06/14/2021        Findings:        Bones and Joints: There is no fracture or suspicious bone lesion. There is     no dislocation. There is moderate to severe medial joint compartment     narrowing and moderate patellofemoral joint space and lateral joint     compartment narrowing. No joint effusion is seen.         Soft Tissues: Unremarkable.  IMPRESSION:         No acute fractures of the left knee.    Moderate to severe degenerative changes of the left knee.            Reviewed and Electronically Signed By: Marnie Scull, MD    Signed Date and Time: 10/11/2023 8:07 AM         CT Head WO Contrast   Final Result    PROCEDURE INFORMATION:     Exam: CT Head Without Contrast     Exam date and time: 10/10/2023 7:27 PM     Age: 68 years old     Clinical indication: Injury or  trauma; Fall; Blunt trauma (contusions or     hematomas); Consciousness not specified; Additional info: Fall hs         TECHNIQUE:     Imaging protocol: Computed tomography of the head without contrast.     Radiation optimization: All CT scans at this facility use at least one of     these     dose optimization techniques: automated exposure control; [place] and/or kV     adjustment per patient size (includes targeted exams where dose is matched     to     clinical indication); or iterative reconstruction.         COMPARISON:     MRI Brain WO Contrast 12/13/2021 9:53 AM         FINDINGS:     Brain: There is no area of intraparenchymal or extra-axial hemorrhage     present.     There is no focal mass. There is no midline shift. The Lorelai Huyser-white matter     junction is intact.     Cerebral ventricles: No ventriculomegaly.     Paranasal sinuses: Visualized sinuses are unremarkable. No fluid levels.     Mastoid air cells: Visualized mastoid air cells are well aerated.     Bones: Unremarkable. No acute fracture.     Soft tissues: Left forehead scalp soft tissue swelling.             IMPRESSION:     1.   Left forehead scalp soft tissue swelling.     2.   Otherwise unremarkable examination of the brain. There is no acute     intracranial abnormality seen.         THIS DOCUMENT HAS BEEN ELECTRONICALLY SIGNED BY [facility] RADIOLOGIST FONDA BLANCO, MD     CT Facial WO Contrast   Final Result    PROCEDURE INFORMATION:     Exam: CT Maxillofacial Without Contrast     Exam date and time: 10/10/2023 7:28 PM     Age: 68 years old     Clinical indication: Injury or trauma; Fall; Blunt trauma (contusions or     hematomas); Orbit/periorbital; Left; Additional info: Fall hs L eye     bruising     edema         TECHNIQUE:     Imaging protocol: Computed tomography of the face without contrast.     Radiation optimization: All CT scans at this facility use at least one of     these     dose optimization techniques: automated exposure control; [place]  and/or kV     adjustment per patient size (includes targeted exams where dose is matched     to     clinical indication); or iterative reconstruction.         COMPARISON:  CT Head WO Contrast 10/10/2023 7:27 PM         FINDINGS:     Paranasal sinuses: Normal. No air-fluid levels.      Orbital cavities: No acute fracture. No retrobulbar hematoma. Globes are     unremarkable. No inflamation.     Bones: No acute fracture.     Soft tissues:  Left periorbital soft tissue swelling.             IMPRESSION:     No evidence for significant traumatic injury to the facial bones or     orbits.         THIS DOCUMENT HAS BEEN ELECTRONICALLY SIGNED BY [facility] RADIOLOGIST FONDA BLANCO, MD       No results found for this visit on 10/10/23 (from the past 24 hours).      Patient educated on their likely diagnosis and follow up care plan. Reasons to return to the Emergency Department were reviewed.    Disposition     Impression(s):  Injury of head, initial encounter    Condition: Stable     Disposition:  Discharge    Mliss Pereyra, MD  Emergency Medicine Attending  [facility]

## 2023-10-25 ENCOUNTER — Encounter (HOSPITAL_BASED_OUTPATIENT_CLINIC_OR_DEPARTMENT_OTHER): Payer: Self-pay | Admitting: Registered Nurse

## 2023-10-25 NOTE — Progress Notes (Signed)
 Chart review complete  OUD medication reviewed  BSAS documentation complete and utd  UDS orders updated

## 2023-10-27 ENCOUNTER — Other Ambulatory Visit (HOSPITAL_BASED_OUTPATIENT_CLINIC_OR_DEPARTMENT_OTHER): Payer: Self-pay | Admitting: Internal Medicine

## 2023-10-27 MED ORDER — CLONAZEPAM 0.5 MG PO TABS
0.5000 mg | ORAL_TABLET | Freq: Two times a day (BID) | ORAL | 1 refills | Status: DC | PRN
Start: 2023-10-27 — End: 2023-11-03

## 2023-10-27 NOTE — Telephone Encounter (Signed)
 PER Patient (self), Cheryl Dixon is a 68 year old female has requested a refill of         Clonazepam .      Only complete for controlled medication:    Previously prescribed:  Start Date 10/13/23         End Date 10/27/2023       Last Office Visit  : 10/14/2023 Jolaine Messing, PA-C      Last Tele Visit:  09/28/2023      Last Physical Exam:  05/27/2013    There are no preventive care reminders to display for this patient.      Other Med Adult:  Most Recent BP Reading(s)  10/14/23 : 126/79        Cholesterol (mg/dL)   Date Value   95/75/7975 212     LOW DENSITY LIPOPROTEIN DIRECT (mg/dL)   Date Value   95/75/7975 151     HIGH DENSITY LIPOPROTEIN (mg/dL)   Date Value   95/75/7975 47     TRIGLYCERIDES (mg/dL)   Date Value   91/82/7976 119         THYROID  SCREEN TSH REFLEX FT4 (uIU/mL)   Date Value   08/20/2022 4.100         No results found for: TSH    HEMOGLOBIN A1C (%)   Date Value   08/20/2022 5.6       No results found for: POCA1C      INR (no units)   Date Value   12/12/2021 1.1   02/16/2007 1.0 (L)   07/03/2006 < 1.0 (L)       SODIUM (mmol/L)   Date Value   06/25/2023 140       POTASSIUM (mmol/L)   Date Value   06/25/2023 4.0           CREATININE (mg/dL)   Date Value   97/72/7974 0.8         Documented patient preferred pharmacies:    [id number] - [place], [place] - [address]  Phone: [phone] Fax: [phone]

## 2023-10-29 ENCOUNTER — Telehealth (HOSPITAL_BASED_OUTPATIENT_CLINIC_OR_DEPARTMENT_OTHER): Payer: Self-pay | Admitting: Internal Medicine

## 2023-10-29 MED ORDER — LIDOCAINE 4 % EX PTCH
1.0000 | MEDICATED_PATCH | CUTANEOUS | 0 refills | Status: AC
Start: 2023-10-29 — End: 2023-11-28

## 2023-10-29 NOTE — Telephone Encounter (Signed)
 Hello dr.    Patient called asking a prescription for lidocaine  patch. Not sure either 4% or 5%, I did test run but patient insurance is terminated. Medicare d is not working.    Please review and advise

## 2023-10-29 NOTE — Telephone Encounter (Signed)
 Call placed to the patient to relay the provider's message regarding RX for lidocaine  patches.  Patient verbalized understanding.  No questions at the end of today's call.    [name] RN

## 2023-10-29 NOTE — Telephone Encounter (Signed)
 Please let patient know that I sent in rx for lidocaine  4% patch, which is what she received most recently in the hospital.  However:  1) this is typically not covered by insurance and  2) her medicare part D insurance, which covers meds, is inactive.    She can buy this OTC (Salonpas patch or generic)

## 2023-11-03 ENCOUNTER — Other Ambulatory Visit (HOSPITAL_BASED_OUTPATIENT_CLINIC_OR_DEPARTMENT_OTHER): Payer: Self-pay | Admitting: Internal Medicine

## 2023-11-03 ENCOUNTER — Ambulatory Visit: Attending: Psychosomatic Medicine | Admitting: Clinical

## 2023-11-03 ENCOUNTER — Ambulatory Visit: Payer: Self-pay | Admitting: Family Medicine

## 2023-11-03 DIAGNOSIS — F1120 Opioid dependence, uncomplicated: Secondary | ICD-10-CM

## 2023-11-03 DIAGNOSIS — F3341 Major depressive disorder, recurrent, in partial remission: Secondary | ICD-10-CM | POA: Diagnosis present

## 2023-11-03 MED ORDER — BUPRENORPHINE HCL-NALOXONE HCL 8-2 MG SL FILM
ORAL_FILM | SUBLINGUAL | 0 refills | Status: DC
Start: 2023-11-03 — End: 2023-12-01

## 2023-11-03 MED ORDER — CLONAZEPAM 0.5 MG PO TABS
0.5000 mg | ORAL_TABLET | Freq: Two times a day (BID) | ORAL | 0 refills | Status: DC | PRN
Start: 2023-11-03 — End: 2023-11-09

## 2023-11-03 NOTE — Progress Notes (Signed)
 Cheryl Dixon is a 68 year old female seen in follow up for opioid use disorder:    Buprenorphine  dose: 3.5 films a day rx'ed (previously 2.5 films a day - increased iso pain, arthritis, surgeries)  Response, adequacy of dose: as above  Relapses/close calls: No  Meetings: attends every 4+ week  Non-prescribed substance use: None reported    Social history/events:  None    Additional psychiatric or medical care/events:  Multiple recent surgeries - followed by frozen shoulder - got cortisone injection     Present Medications:  lidocaine  (SALONPAS) 4 % PTCH, Apply 1 patch topically in the morning., Disp: 30 patch, Rfl: 0  clonazePAM  (KLONOPIN ) 0.5 MG tablet, Take 1 tablet by mouth 2 (two) times daily as needed for Anxiety  for up to 14 days, Disp: 14 tablet, Rfl: 1  cetirizine  (ZYRTEC ) 10 MG tablet, TAKE 1 TABLET BY MOUTH EVERY DAY IN THE MORNING, Disp: 90 tablet, Rfl: 3  cyclobenzaprine  (FLEXERIL ) 5 MG tablet, Take 1 tablet by mouth 2 (two) times daily as needed, Disp: 60 tablet, Rfl: 0  hydrOXYzine  (ATARAX ) 25 MG tablet, Take 1 tablet by mouth nightly as needed for Itching, Disp: 30 tablet, Rfl: 0  buprenorphine -naloxone  (SUBOXONE ) 8-2 MG sublingual film, Take 1.5 films under the tongue in the morning, 1 film midday, and 1 film at night, Disp: 98 Film, Rfl: 0  celecoxib  (CELEBREX ) 200 MG capsule, Take 1 capsule by mouth daily, Disp: 30 capsule, Rfl: 0  solifenacin  (VESICARE ) 10 MG tablet, TAKE 1 TABLET BY MOUTH EVERY DAY IN THE MORNING, Disp: 90 tablet, Rfl: 0  OTHER MEDICATION, SHOWER CHAIR  For use as directed.    DX: gait instability, OA knee  ICD10:R26.81, M17.9 (Patient not taking: Reported on 10/09/2023), Disp: 1 each, Rfl: 0  calcium  carb-cholecalciferol  500-10 MG-MCG TABS tablet, Take 1 tablet by mouth in the morning and 1 tablet before bedtime., Disp: 180 tablet, Rfl: 3  magnesium  oxide (MAG-OX) 400 (240 Mg) MG tablet, Take 1 tablet by mouth daily, Disp: 90 tablet, Rfl: 3  acetaminophen  (TYLENOL ) 500 MG tablet,  Take 1 tablet by mouth every 8 (eight) hours as needed for Pain, Disp: 549 tablet, Rfl: 0  aspirin  (ASPIRIN  81) 81 MG chewable tablet, Take 1 tablet by mouth in the morning and 1 tablet before bedtime. (Patient not taking: Reported on 10/09/2023), Disp: , Rfl:   estradiol  (ESTRACE ) 0.1 MG/GM vaginal cream, PLACE 5 G VAGINALLY 3 TIMES A WEEK MYLAN BRAND ONLY., Disp: 42.5 g, Rfl: 5  Cholecalciferol  (VITAMIN D3) 50 MCG (2000 UT) TABS, TAKE 1 TABLET BY MOUTH EVERY DAY IN THE MORNING, Disp: 90 tablet, Rfl: 3  famotidine  (PEPCID ) 20 MG tablet, TAKE 1 TABLET BY MOUTH IN THE MORNING AND BEFORE BEDTIME, Disp: 180 tablet, Rfl: 0  atorvastatin  (LIPITOR) 80 MG tablet, Take 1 tablet by mouth daily after dinner, Disp: 90 tablet, Rfl: 3  polyethylene glycol (GLYCOLAX /MIRALAX ) 17 g packet, Take 1 packet by mouth daily (Patient not taking: Reported on 10/09/2023), Disp: 90 packet, Rfl: 3  albuterol  HFA 108 (90 Base) MCG/ACT inhaler, Inhale 2 puffs into the lungs every 6 (six) hours as needed for Wheezing, Disp: , Rfl:   docusate sodium  (COLACE) 100 MG capsule, TAKE 1 CAPSULE BY MOUTH IN THE MORNING AND BEFORE BEDTIME, Disp: 180 capsule, Rfl: 3  tiotropium-olodaterol (STIOLTO RESPIMAT ) 2.5-2.5 MCG/ACT inhal, Inhale 2 puffs into the lungs daily (Patient not taking: Reported on 10/09/2023), Disp: , Rfl:   buPROPion  (WELLBUTRIN  SR) 200 MG 12 hr tablet,  TAKE 1 TABLET BY MOUTH IN THE MORNING AND BEFORE BEDTIME, Disp: 180 tablet, Rfl: 3    No current facility-administered medications on file prior to visit.      Last UDS:  LAST UDS   AMPHETAMINES URINE (ng/mL)   Date Value   10/27/2022 NEGATIVE     COCAINE  METABOLITES URINE (ng/mL)   Date Value   10/27/2022 NEGATIVE     OPIATES URINE (ng/mL)   Date Value   10/27/2022 NEGATIVE     BENZODIAZEPINES URINE (ng/mL)   Date Value   10/27/2022 NEGATIVE     CANNABINOIDS URINE (ng/mL)   Date Value   10/27/2022 POSITIVE (A)     ETHANOL URINE (mg/dL)   Date Value   93/92/7976 NEGATIVE       Review of  Systems: no notable symptoms    PHYSICAL EXAMINATION: by video  General appearance - healthy female in no distress  Eyes - pupils normal  Skin - warm and dry  Neuro - nonfocal  Affect - normal    Problem List Items Addressed This Visit          Mental Health    Opioid use disorder on agonist therapy (HCC)         Today in group we discussed rebuidling relationships in recovery. Cheryl Dixon contributed.    Assessment:  Opioid use disorder  Comment: the patient actively participated and shared experiences, interacting appropriately with group without barriers to understanding  Plan:  Continue suboxone  to 3.5 films daily dose divided TID (1.5 films in AM, 1 film mid day, and 1 film at night).  There are days she takes 3 films a day but others where she takes 3.5 films a day if needed for pain control.  Patient is counseled regarding relapse prevention, involvement in recovery groups, and potential side effects of buprenorphine .    Cheryl Burton, MD, 11/03/2023

## 2023-11-03 NOTE — Telephone Encounter (Signed)
 PER Patient (self), Cheryl Dixon is a 68 year old female has requested a refill of       clonazepam .        Only complete for controlled medication:    Previously prescribed:  Start Date 10/30/2023         End Date 11/10/2023             Last Office Visit  : 10/14/2023 Jolaine Messing, PA-C      Last Tele Visit:  11/03/2023      Last Physical Exam:  05/27/2013    There are no preventive care reminders to display for this patient.    Other Med Adult:  Most Recent BP Reading(s)  10/14/23 : 126/79        Cholesterol (mg/dL)   Date Value   95/75/7975 212     LOW DENSITY LIPOPROTEIN DIRECT (mg/dL)   Date Value   95/75/7975 151     HIGH DENSITY LIPOPROTEIN (mg/dL)   Date Value   95/75/7975 47     TRIGLYCERIDES (mg/dL)   Date Value   91/82/7976 119         THYROID  SCREEN TSH REFLEX FT4 (uIU/mL)   Date Value   08/20/2022 4.100         No results found for: TSH    HEMOGLOBIN A1C (%)   Date Value   08/20/2022 5.6       No results found for: POCA1C      INR (no units)   Date Value   12/12/2021 1.1   02/16/2007 1.0 (L)   07/03/2006 < 1.0 (L)       SODIUM (mmol/L)   Date Value   06/25/2023 140       POTASSIUM (mmol/L)   Date Value   06/25/2023 4.0           CREATININE (mg/dL)   Date Value   97/72/7974 0.8       Documented patient preferred pharmacies:    [id number] - [place], [place] - [address]  Phone: [phone] Fax: [phone]

## 2023-11-04 NOTE — Progress Notes (Signed)
 OUTPATIENT PSYCHIATRY GROUP PROGRESS NOTE    For the patient participating in the group via telephone and/or video technology: The patient/guardian has verbally consented to participate in this group therapy session by televisit. The patient/guardian was encouraged to be in a private location due to personal health information being discussed and where they could not be overheard by individuals not participating in the group therapy. Although all [facility] staff/providers participating in this group therapy televisit are in a private location, all the risks of telephone and/or video technology used to conduct this group therapy session cannot be controlled by [facility], (i.e. interruptions, unauthorized access/recording and technical difficulties).  Each patient understands that the visit can be stopped at any time for any reason, including if the conferencing connections are inadequate.    Cheryl Dixon is a 68 year old, Divorced, female, who presented for [facility] group visit.   Length of group: 60 min which included 30 min of Health Behavior Intervention and OUD Psychoeducation.    Group Name: OBOT: Office Based Opioid Treatment Group    Group Topic Today: Rebuilding Trust, Comptroller, and Navigating Social Connections in Recovery.   Group Description: This is a supportive, team-led recovery group for participants working on maintaining wellness, navigating OUD recovery, and building meaningful lives. Group sessions focus on reflection, connection, and developing practical strategies for personal growth. Members are encouraged to share openly, offer mutual support, and explore themes such as identity, coping skills, relationships, purpose, and resilience. Each week, we introduce a focused discussion topic to guide conversation and foster deeper insight. The group offers a consistent space for members to check in, set intentions, and stay grounded in their recovery goals while learning from one another's  experiences.    Leaders:  Estefana Burton, MD, Pietra Zuluaga, Ph.D., Arnulfo Ohara, RN    Service Type: [phone] Group Psychotherapy     Number of participants in group today:  10        Purpose of Group (choose all that apply):   Skills training/rehab  Support (psychological, family, community resources, recovery)  Insight and behavior change  Increasing health promoting behaviors  Relapse prevention and enhancing Recovery Process  Education about Opioid Use Disorder (OUD) and treatment  Enhancing adjustment and coping with OUD  Improving management of OUD and treatment adherence  Decreasing health risk behaviors and risks of relapse  Addressing psychological/psychosocial/behavioral barriers to preventing addiction and relapse        Group Process:    Review of group goals/structure  Education about Opioid Use Disorder (OUD) and related topics  Group discussion and sharing.    Review of strategies to increase healthy coping mechanisms to support recovery and change.  Review of additional relapse prevention strategies.   Discussion of support strategies  Check in    Individual Patient Participation (select all that apply):  [x]   Attentive to process  [x]   Contributed to group discussion  [x]   Shared personal experience related to subject addressed in group    Subjective report of pt's progress: Pt reported that she has recently undergone multiple recent surgeries. She is recovering from a frozen shoulder, which developed as a complication during the healing process. She recently received a cortisone injection. On a positive note, she enjoys spending time with her great grandchildren.     Social History update: no major updates reported  Report on relapses and close calls:  No relapse/close calls disclosed.     Medical Necessity of Session (how treatment is necessary to improve symptoms,  functioning, or prevent worsening): treatment is necessary to enhance compliance with agonist treatment and maintain abstinence from  opioids. Suboxone  group participation is mandatory to continue Suboxone  prescription and is necessary in order to enhance coping with Opioid Use Disorder.      Relevant Changes in Mental Status: No. No acute issues reported or noted     Risk Level per Scale:  Suicide: re-assessment not indicated today  Violence: re-assessment not indicated today  Addiction: low     Current risk level represents increase in risk: No.     Plan: Pt will continue OBOT treatment      DIAGNOSES:     Opioid dependence on agonist therapy (HCC)  (primary encounter diagnosis)   SNOMED CT(R): OPIOID DEPENDENCE, ON AGONIST THERAPY     MDD (major depressive disorder), recurrent, in partial remission (HCC)   SNOMED CT(R): RECURRENT MAJOR DEPRESSION IN PARTIAL REMISSION      REVIEWING TODAY'S VISIT:   Reviewing Today's Visit:  Clinical Interventions Today: Motivational Interviewing; Cognitive Behavioral Therapy (CBT); Relapse Prevention for SUDs  Patient's Response to Interventions: Responsive  Time Spent in Psychotherapy: 60 min            CARE PLAN/ EPISODES:  Linked Episodes   Type: Episode: Status: Noted: Resolved: Last update: Updated by:   PCBHI PCBHI OBOT  Active 09/11/2020  11/03/2023 12:19 PM [name], PhD      Comments:       Patient/Guardian:       Strengths/Skills: Help seeking and motivated to get better     Potential Barriers: Multiple life stressors       Patient stated goals: Pt would like to prevent relapses and support her recovery process     Problem 1: Opioid Dependence on Agonist Therapy (Opioid Use Disorder on Agonist Therapy)      Short Term Goals: Pt will use at least one copying skill a day to reduce a risk of relapse, enhance coping with OUD, and support her recovery process     Short Term Target:  60 days  Short TermTarget Date: 07/04/2023  Short Term Goal #1 Progress:       Long Term Goals: Pt will report 50% in urges and cravings reduction and no relapses or close calls       Long Term Target:  90 days  Long  TermTarget Date:  08/03/2023   Long Term Goal #1 Progress:         Intervention #1: PCBHI-based OBOT/Group Psychotherapy   Intervention Frequency:  biweekly  Intervention Duration:  90 Days  Intervention Responsibility:  J. Marylyn Appenzeller, PhD and the OBOT team  Tavis Kring, PhD

## 2023-11-06 ENCOUNTER — Telehealth (HOSPITAL_BASED_OUTPATIENT_CLINIC_OR_DEPARTMENT_OTHER): Payer: Self-pay | Admitting: Internal Medicine

## 2023-11-06 NOTE — Telephone Encounter (Signed)
 Hello,    Pt seems to be confusing her CYCLObenzaprine  and CLONazepam     Pt stated she has not picked up her Clonazepam - she states the pharmacy told her it is too soon to fill and she did not pick up the medication 2 days    Pt does not want to continue to take the Cyclobenzaprine  because it is too strong and makes her sleepy-     I told her I would have the nurse call her back    Please adv  Thank you

## 2023-11-06 NOTE — Telephone Encounter (Signed)
 Patient calling stating clonazepam  is not due to fill until Sunday but patient is stating she is out of meds now.    Wants dr to call and  approve fill to get med today or tomorrow

## 2023-11-06 NOTE — Telephone Encounter (Signed)
 [facility] to [facility], staff confirms that the RX for clonazepam  was picked on 11/04/23, 14 tablets in all.    Call placed to the patient.  Patient to refill rx when due, was mistaken of refill date.    Takeila Thayne RN

## 2023-11-06 NOTE — Telephone Encounter (Signed)
 According to PMP she picked up clonazepam  7/9 which was only 3 days ago so she should not be out yet.  Based on this day refill is not due until 7/16 and can be filled at that time

## 2023-11-09 ENCOUNTER — Ambulatory Visit: Attending: Physician Assistant | Admitting: Physician Assistant

## 2023-11-09 ENCOUNTER — Ambulatory Visit (HOSPITAL_BASED_OUTPATIENT_CLINIC_OR_DEPARTMENT_OTHER): Payer: Self-pay | Admitting: Orthopaedic Surgery

## 2023-11-09 ENCOUNTER — Telehealth (HOSPITAL_BASED_OUTPATIENT_CLINIC_OR_DEPARTMENT_OTHER): Payer: Self-pay | Admitting: Registered Nurse

## 2023-11-09 ENCOUNTER — Encounter (HOSPITAL_BASED_OUTPATIENT_CLINIC_OR_DEPARTMENT_OTHER): Payer: Self-pay | Admitting: Physician Assistant

## 2023-11-09 DIAGNOSIS — F419 Anxiety disorder, unspecified: Secondary | ICD-10-CM | POA: Diagnosis not present

## 2023-11-09 DIAGNOSIS — Z96651 Presence of right artificial knee joint: Secondary | ICD-10-CM | POA: Diagnosis not present

## 2023-11-09 DIAGNOSIS — L219 Seborrheic dermatitis, unspecified: Secondary | ICD-10-CM | POA: Diagnosis not present

## 2023-11-09 MED ORDER — HYDROXYZINE HCL 25 MG PO TABS
25.0000 mg | ORAL_TABLET | Freq: Every evening | ORAL | 0 refills | Status: AC | PRN
Start: 2023-11-09 — End: 2023-12-07

## 2023-11-09 MED ORDER — KETOCONAZOLE 2 % EX SHAM
MEDICATED_SHAMPOO | CUTANEOUS | 1 refills | Status: AC
Start: 2023-11-09 — End: 2023-12-07

## 2023-11-09 MED ORDER — CLONAZEPAM 0.5 MG PO TABS
0.5000 mg | ORAL_TABLET | Freq: Two times a day (BID) | ORAL | 1 refills | Status: DC | PRN
Start: 2023-11-09 — End: 2023-11-26

## 2023-11-09 NOTE — Progress Notes (Signed)
 Cheryl Dixon is a 68 year old female pt of Dr. [name], MD who presents for follow-up      TKA  History of right TKA with complications requiring revision surgery  Followed by orthopedics  Physical therapy was ordered but patient reports she has not started this yet  States she called but could not reach anyone and was told she would get a call back      Skin problem  Itching and flaking of scalp  States it looks similar to cradle cap      Anxiety  Taking clonazepam  0.5 mg twice daily with good effect  Almost due for a refill  Requested a refill last week but realized it was too soon, states she has been mistake about the day  Also uses hydroxyzine  to help with sleep        Patient Active Problem List:     Opioid use disorder on agonist therapy (HCC)     Tobacco use disorder     Fibrocystic breast     Essential hypertension     Knee pain     Chronic low back pain     OA (osteoarthritis) of knee     H. pylori infection     Major depressive disorder, recurrent episode, severe (HCC)     Occult blood in stools     Prediabetes     Epigastric pain     Chronic obstructive pulmonary disease (HCC)     Left foot pain     Multiple polyps of colon     Acquired deformity of toe, right     Dislocation of metatarsophalangeal joint of lesser toe, right, sequela     Postmenopausal atrophic vaginitis     Abnormal loss of weight     Aneurysm of ascending aorta without rupture (HCC)     COVID-19 virus infection     BMI 32.0-32.9,adult     Chronic dysuria     LLQ pain     Osteoporosis     History of endometriosis     Lung cancer screening     Constipation, drug induced     Weakness     Cerebrovascular accident (CVA) (HCC)     Overactive bladder      [facility]) [phone] Option #4     Substance addiction (HCC)     Anxiety     Obese     S/P total knee arthroplasty, right     Patellar tendon rupture, right, initial encounter     Status post total right knee replacement     Rupture patellar tendon, right,  subsequent encounter     Patellar tendon rupture, right, sequela     S/P revision of total knee, right     Gait instability     Pain of right lower extremity    buprenorphine -naloxone  (SUBOXONE ) 8-2 MG sublingual film, Take 1.5 films under the tongue in the morning, 1 film midday, and 1 film at night, Disp: 98 Film, Rfl: 0  [DISCONTINUED] clonazePAM  (KLONOPIN ) 0.5 MG tablet, Take 1 tablet by mouth 2 (two) times daily as needed for Anxiety  for up to 7 days, Disp: 14 tablet, Rfl: 0  lidocaine  (SALONPAS) 4 % PTCH, Apply 1 patch topically in the morning., Disp: 30 patch, Rfl: 0  cetirizine  (ZYRTEC ) 10 MG tablet, TAKE 1 TABLET BY MOUTH EVERY DAY IN THE MORNING, Disp: 90 tablet, Rfl: 3  cyclobenzaprine  (FLEXERIL ) 5 MG tablet, Take 1 tablet by mouth 2 (two)  times daily as needed, Disp: 60 tablet, Rfl: 0  [DISCONTINUED] hydrOXYzine  (ATARAX ) 25 MG tablet, Take 1 tablet by mouth nightly as needed for Itching, Disp: 30 tablet, Rfl: 0  celecoxib  (CELEBREX ) 200 MG capsule, Take 1 capsule by mouth daily, Disp: 30 capsule, Rfl: 0  solifenacin  (VESICARE ) 10 MG tablet, TAKE 1 TABLET BY MOUTH EVERY DAY IN THE MORNING, Disp: 90 tablet, Rfl: 0  OTHER MEDICATION, SHOWER CHAIR  For use as directed.    DX: gait instability, OA knee  ICD10:R26.81, M17.9 (Patient not taking: Reported on 10/09/2023), Disp: 1 each, Rfl: 0  calcium  carb-cholecalciferol  500-10 MG-MCG TABS tablet, Take 1 tablet by mouth in the morning and 1 tablet before bedtime., Disp: 180 tablet, Rfl: 3  magnesium  oxide (MAG-OX) 400 (240 Mg) MG tablet, Take 1 tablet by mouth daily, Disp: 90 tablet, Rfl: 3  acetaminophen  (TYLENOL ) 500 MG tablet, Take 1 tablet by mouth every 8 (eight) hours as needed for Pain, Disp: 549 tablet, Rfl: 0  aspirin  (ASPIRIN  81) 81 MG chewable tablet, Take 1 tablet by mouth in the morning and 1 tablet before bedtime. (Patient not taking: Reported on 10/09/2023), Disp: , Rfl:   estradiol  (ESTRACE ) 0.1 MG/GM vaginal cream, PLACE 5 G VAGINALLY 3 TIMES A  WEEK MYLAN BRAND ONLY., Disp: 42.5 g, Rfl: 5  Cholecalciferol  (VITAMIN D3) 50 MCG (2000 UT) TABS, TAKE 1 TABLET BY MOUTH EVERY DAY IN THE MORNING, Disp: 90 tablet, Rfl: 3  atorvastatin  (LIPITOR) 80 MG tablet, Take 1 tablet by mouth daily after dinner, Disp: 90 tablet, Rfl: 3  polyethylene glycol (GLYCOLAX /MIRALAX ) 17 g packet, Take 1 packet by mouth daily (Patient not taking: Reported on 10/09/2023), Disp: 90 packet, Rfl: 3  albuterol  HFA 108 (90 Base) MCG/ACT inhaler, Inhale 2 puffs into the lungs every 6 (six) hours as needed for Wheezing, Disp: , Rfl:   docusate sodium  (COLACE) 100 MG capsule, TAKE 1 CAPSULE BY MOUTH IN THE MORNING AND BEFORE BEDTIME, Disp: 180 capsule, Rfl: 3  tiotropium-olodaterol (STIOLTO RESPIMAT ) 2.5-2.5 MCG/ACT inhal, Inhale 2 puffs into the lungs daily (Patient not taking: Reported on 10/09/2023), Disp: , Rfl:   buPROPion  (WELLBUTRIN  SR) 200 MG 12 hr tablet, TAKE 1 TABLET BY MOUTH IN THE MORNING AND BEFORE BEDTIME, Disp: 180 tablet, Rfl: 3    No current facility-administered medications on file prior to visit.    Review of Patient's Allergies indicates:   Darvon                     Meperidine hcl              Comment:Nausea/vomit   Paxil [paroxetine]      Rash   Zoloft  [sertraline  *    Rash        Exam  Pulmonary - speaking full sentences          ASSESSMENT/PLAN        (S03.348) S/P revision of total knee, right  (primary encounter diagnosis)  Comment: Followed by orthopedics, physical therapy was ordered but patient states she has not been able to schedule it.  Reports she called to schedule but could not reach anyone.  Reviewed the phone number for physical therapy and noted that she called the wrong number.  Plan: Confirmed number for PT and advised patient to call and schedule an appointment      (F41.9) Anxiety  Comment: Clonazepam  has been helpful as well as hydroxyzine  as needed for sleep  Plan: clonazePAM  (KLONOPIN ) 0.5 MG tablet,  hydrOXYzine  (ATARAX ) 25 MG tablet         Cautioned not to combine medication as it could cause excessive drowsiness      (L21.9) Seborrheic dermatitis  Comment: Itching flaky scalp most consistent with seborrheic dermatitis  Plan: ketoconazole  (NIZORAL ) 2 % shampoo

## 2023-11-09 NOTE — Telephone Encounter (Signed)
 Adult General Triage    I spoke with Patient:       Patient requesting refill for Clonazepam  -- requested message sent to Dr. Renda.   Patient requesting televisit with PA Jolaine only to discuss , side effects from Cyclobenzaprine .   Rn gave patient phone number for PT, PSYCH     Patient Active Problem List:     Opioid use disorder on agonist therapy (HCC)     Tobacco use disorder     Fibrocystic breast     Essential hypertension     Knee pain     Chronic low back pain     OA (osteoarthritis) of knee     H. pylori infection     Major depressive disorder, recurrent episode, severe (HCC)     Occult blood in stools     Prediabetes     Epigastric pain     Chronic obstructive pulmonary disease (HCC)     Left foot pain     Multiple polyps of colon     Acquired deformity of toe, right     Dislocation of metatarsophalangeal joint of lesser toe, right, sequela     Postmenopausal atrophic vaginitis     Abnormal loss of weight     Aneurysm of ascending aorta without rupture (HCC)     COVID-19 virus infection     BMI 32.0-32.9,adult     Chronic dysuria     LLQ pain     Osteoporosis     History of endometriosis     Lung cancer screening     Constipation, drug induced     Weakness     Cerebrovascular accident (CVA) (HCC)     Overactive bladder      [facility]) [phone] Option #4     Substance addiction (HCC)     Anxiety     Obese     S/P total knee arthroplasty, right     Patellar tendon rupture, right, initial encounter     Status post total right knee replacement     Rupture patellar tendon, right, subsequent encounter     Patellar tendon rupture, right, sequela     S/P revision of total knee, right     Gait instability     Pain of right lower extremity      Review of Patient's Allergies indicates:   Darvon                     Meperidine hcl              Comment:Nausea/vomit   Paxil [paroxetine]      Rash   Zoloft  [sertraline  *    Rash    Emergency care:     Loss of Consciousness:  No     Chest  Pain:  No     Severe Difficulty Breathing:  No     Concern for life threatening condition:  No     The patient has the following symptoms:     Advised per nursing triage protocol.         Recommended disposition for patient:  Disposition: Scheduled Televisit Appointment     If patient referred to UC/ED advised that they may require further follow up and testing after the visit with their primary care office.     Instructed patient to call back for any new, worsening, or worrisome symptoms or concerns any time day or night.    Advised per nursing triage protocol.  Telephone Call Outcome:  Single Call Resolution

## 2023-11-13 ENCOUNTER — Other Ambulatory Visit (HOSPITAL_BASED_OUTPATIENT_CLINIC_OR_DEPARTMENT_OTHER): Payer: Self-pay | Admitting: Physician Assistant

## 2023-11-13 DIAGNOSIS — G8929 Other chronic pain: Secondary | ICD-10-CM

## 2023-11-13 NOTE — Telephone Encounter (Signed)
 PER Pharmacy, Cheryl Dixon is a 68 year old female has requested a refill of       CYCLOBENZAPRINE  5 MG TABLET .          Last Office Visit  : 10/14/2023 Jolaine Messing, PA-C      Last Tele Visit:  11/09/2023      Last Physical Exam:  05/27/2013    There are no preventive care reminders to display for this patient.    Other Med Adult:  Most Recent BP Reading(s)  10/14/23 : 126/79        Cholesterol (mg/dL)   Date Value   95/75/7975 212     LOW DENSITY LIPOPROTEIN DIRECT (mg/dL)   Date Value   95/75/7975 151     HIGH DENSITY LIPOPROTEIN (mg/dL)   Date Value   95/75/7975 47     TRIGLYCERIDES (mg/dL)   Date Value   91/82/7976 119         THYROID  SCREEN TSH REFLEX FT4 (uIU/mL)   Date Value   08/20/2022 4.100         No results found for: TSH    HEMOGLOBIN A1C (%)   Date Value   08/20/2022 5.6       No results found for: POCA1C      INR (no units)   Date Value   12/12/2021 1.1   02/16/2007 1.0 (L)   07/03/2006 < 1.0 (L)       SODIUM (mmol/L)   Date Value   06/25/2023 140       POTASSIUM (mmol/L)   Date Value   06/25/2023 4.0           CREATININE (mg/dL)   Date Value   97/72/7974 0.8       Documented patient preferred pharmacies:    [id number] - [place], [place] - [address]  Phone: [phone] Fax: [phone]

## 2023-11-17 ENCOUNTER — Telehealth (HOSPITAL_BASED_OUTPATIENT_CLINIC_OR_DEPARTMENT_OTHER): Payer: Self-pay | Admitting: Internal Medicine

## 2023-11-17 ENCOUNTER — Ambulatory Visit (HOSPITAL_BASED_OUTPATIENT_CLINIC_OR_DEPARTMENT_OTHER): Payer: Self-pay

## 2023-11-17 NOTE — Telephone Encounter (Signed)
-----   Message from [place] A sent at 11/17/2023 10:55 AM EDT -----  Regarding: PT1  Cheryl Dixon [id number], 68 year old, female Telephone Information:  Home Phone      [phone]  Work Phone      Not on file.  Mobile          [phone]      Calls today: Forms  PT1 form Patient requesting a PT-1 [facility] Transportation Form  Name of treating provider/facility: 1.[facility]                                                        2.[facility]                                                         3.[facility]    6 visit(s) per week 12 visit(s) per month  Member requires door through door or room to room service? No  Member will be transported in a wheel chair? No  Member has a service animal? No  Member has an escort? Yes  Member is bariatric? No  Transportation request is for applied behavioral analysis? No    Patient Pick Up Address [address]                                          [place] [address]     Patient's language of care: English  Patient's PCP: [name], MD

## 2023-11-17 NOTE — Telephone Encounter (Signed)
 PT-1 Request Confirmation  PT-1 request is submitted  PT-1 Request [phone] is Pending . For pending submissions, please check the portal periodically for updates.  An email notification has been sent to the provider on behalf of whom the PT-1 request was submitted. To update the email address, please visit your profile page.        Below are the details of your pending request.     Trip Summary       Member Home:  [id number] [address]itra Donaghey [id number] [facility] [address]  [place]ing [place]  [facility]   Treatment Details    Treating facility within [facility] Yes   Medical treatment type: Z00-Z99 - Factors influencing health status and contact with health services   Duration: 6 Week(s)   Frequency: 12 visit(s) per Month   Transportation Details    Member will require a wheelchair van No   Member will be accompanied by an escort Yes   Member will require a service animal No   For any assistance, please contact [facility] at [phone]).   Homepage     PT-1 Request Confirmation  PT-1 request is submitted  PT-1 Request [phone] is Pending . For pending submissions, please check the portal periodically for updates.  An email notification has been sent to the provider on behalf of whom the PT-1 request was submitted. To update the email address, please visit your profile page.        Below are the details of your pending request.     Trip Summary       Member Home:  [id number] Nihal Doan [address] [facility] [address]  Treating Location:  [place] [phone] [facility] [address]   Treatment Details    Treating facility within member's locality No   Medical treatment type: Z00-Z99 - Factors influencing health status and contact with health services   Duration: 6 Week(s)   Frequency: 12 visit(s) per Month   Transportation Details    Member will require a wheelchair van No   Member will be  accompanied by an escort Yes   Member will require a service animal No   For any assistance, please [facility] Center at [phone]).   Homepage    PT-1 Request Confirmation  PT-1 request is submitted  PT-1 Request [phone] is Pending . For pending submissions, please check the portal periodically for updates.  An email notification has been sent to the provider on behalf of whom the PT-1 request was submitted. To update the email address, please visit your profile page.        Below are the details of your pending request.     Trip Summary       Member Home:  [id number] Ladonne Sharples [address] [facility] [address]  Treating Location:  [facility]   Treatment Details    Treating facility within [facility] locality Yes   Medical treatment type: Z00-Z99 - Factors influencing health status and contact with health services   Duration: 6 Week(s)   Frequency: 12 visit(s) per Month   Transportation Details    Member will require a wheelchair van No   Member will be accompanied by an escort Yes   Member will require a service animal No   For any assistance, please Control and [facility] Service Center at [phone]).   Homepage

## 2023-11-19 ENCOUNTER — Ambulatory Visit: Attending: Physician Assistant | Admitting: Rehabilitative and Restorative Service Providers"

## 2023-11-19 ENCOUNTER — Other Ambulatory Visit: Payer: Self-pay

## 2023-11-19 DIAGNOSIS — M25561 Pain in right knee: Secondary | ICD-10-CM | POA: Diagnosis present

## 2023-11-19 DIAGNOSIS — Z96651 Presence of right artificial knee joint: Secondary | ICD-10-CM | POA: Insufficient documentation

## 2023-11-19 DIAGNOSIS — G8929 Other chronic pain: Secondary | ICD-10-CM | POA: Insufficient documentation

## 2023-11-19 DIAGNOSIS — M19012 Primary osteoarthritis, left shoulder: Secondary | ICD-10-CM | POA: Diagnosis present

## 2023-11-19 DIAGNOSIS — M19011 Primary osteoarthritis, right shoulder: Secondary | ICD-10-CM | POA: Insufficient documentation

## 2023-11-23 ENCOUNTER — Encounter (HOSPITAL_BASED_OUTPATIENT_CLINIC_OR_DEPARTMENT_OTHER): Payer: Self-pay | Admitting: Registered Nurse

## 2023-11-23 NOTE — Progress Notes (Signed)
 Patient has been present at all scheduled online google groups for suboxone.  Participates in group discussion.  Denies any cravings or close calls.  Stable on current dose of suboxone.  Otherwise well.  Denies any other c/o.  Chart review complete.

## 2023-11-23 NOTE — Progress Notes (Unsigned)
 Outpatient {Rehab Department Spell Checked:22358} Initial Evaluation  [facility]: {AMBREHABLOCATIONS:22266}    Patient Name: Cheryl Dixon  DOB: 01/22/1956  Referring Provider: ***  Date of Onset: ***  Diagnosis: ***  ICD-10: ***    Subjective: ***    Pain: ***    Imaging: ***    Pre-morbid Functional Level: ***  Functional Deficits: ***    Occupation: ***  Dressing/Grooming: ***  Driving: ***  Sleeping: ***    Contraindications/Precautions: ***    PMH:   Patient Active Problem List:     Opioid use disorder on agonist therapy (HCC)     Tobacco use disorder     Fibrocystic breast     Essential hypertension     Knee pain     Chronic low back pain     OA (osteoarthritis) of knee     H. pylori infection     Major depressive disorder, recurrent episode, severe (HCC)     Occult blood in stools     Prediabetes     Epigastric pain     Chronic obstructive pulmonary disease (HCC)     Left foot pain     Multiple polyps of colon     Acquired deformity of toe, right     Dislocation of metatarsophalangeal joint of lesser toe, right, sequela     Postmenopausal atrophic vaginitis     Abnormal loss of weight     Aneurysm of ascending aorta without rupture (HCC)     COVID-19 virus infection     BMI 32.0-32.9,adult     Chronic dysuria     LLQ pain     Osteoporosis     History of endometriosis     Lung cancer screening     Constipation, drug induced     Weakness     Cerebrovascular accident (CVA) (HCC)     Overactive bladder      [facility] [phone] Option #4     Substance addiction (HCC)     Anxiety     Obese     S/P total knee arthroplasty, right     Patellar tendon rupture, right, initial encounter     Status post total right knee replacement     Rupture patellar tendon, right, subsequent encounter     Patellar tendon rupture, right, sequela     S/P revision of total knee, right     Gait instability     Pain of right lower extremity    Past Medical History:  No date: Anxiety  No date:  Arthritis  No date: COPD (chronic obstructive pulmonary disease) (HCC)  No date: Depression  No date: Obese  No date: Stroke [facility])  No date: Substance addiction (HCC)  No date: Varicose veins of lower extremities    Medications:  cyclobenzaprine  (FLEXERIL ) 5 MG tablet, TAKE 1 TABLET BY MOUTH TWICE A DAY AS NEEDED, Disp: 60 tablet, Rfl: 0  ketoconazole  (NIZORAL ) 2 % shampoo, Apply topically twice a week, Disp: 120 mL, Rfl: 1  clonazePAM  (KLONOPIN ) 0.5 MG tablet, Take 1 tablet by mouth 2 (two) times daily as needed for Anxiety  for up to 14 days, Disp: 14 tablet, Rfl: 1  hydrOXYzine  (ATARAX ) 25 MG tablet, Take 1 tablet by mouth nightly as needed for Itching, Disp: 30 tablet, Rfl: 0  buprenorphine -naloxone  (SUBOXONE ) 8-2 MG sublingual film, Take 1.5 films under the tongue in the morning, 1 film midday, and 1 film at night, Disp: 98 Film, Rfl: 0  lidocaine  (SALONPAS)  4 % PTCH, Apply 1 patch topically in the morning., Disp: 30 patch, Rfl: 0  cetirizine  (ZYRTEC ) 10 MG tablet, TAKE 1 TABLET BY MOUTH EVERY DAY IN THE MORNING, Disp: 90 tablet, Rfl: 3  celecoxib  (CELEBREX ) 200 MG capsule, Take 1 capsule by mouth daily, Disp: 30 capsule, Rfl: 0  solifenacin  (VESICARE ) 10 MG tablet, TAKE 1 TABLET BY MOUTH EVERY DAY IN THE MORNING, Disp: 90 tablet, Rfl: 0  OTHER MEDICATION, SHOWER CHAIR  For use as directed.    DX: gait instability, OA knee  ICD10:R26.81, M17.9 (Patient not taking: Reported on 10/09/2023), Disp: 1 each, Rfl: 0  calcium  carb-cholecalciferol  500-10 MG-MCG TABS tablet, Take 1 tablet by mouth in the morning and 1 tablet before bedtime., Disp: 180 tablet, Rfl: 3  magnesium  oxide (MAG-OX) 400 (240 Mg) MG tablet, Take 1 tablet by mouth daily, Disp: 90 tablet, Rfl: 3  acetaminophen  (TYLENOL ) 500 MG tablet, Take 1 tablet by mouth every 8 (eight) hours as needed for Pain, Disp: 549 tablet, Rfl: 0  aspirin  (ASPIRIN  81) 81 MG chewable tablet, Take 1 tablet by mouth in the morning and 1 tablet before bedtime. (Patient not  taking: Reported on 10/09/2023), Disp: , Rfl:   estradiol  (ESTRACE ) 0.1 MG/GM vaginal cream, PLACE 5 G VAGINALLY 3 TIMES A WEEK MYLAN BRAND ONLY., Disp: 42.5 g, Rfl: 5  Cholecalciferol  (VITAMIN D3) 50 MCG (2000 UT) TABS, TAKE 1 TABLET BY MOUTH EVERY DAY IN THE MORNING, Disp: 90 tablet, Rfl: 3  atorvastatin  (LIPITOR) 80 MG tablet, Take 1 tablet by mouth daily after dinner, Disp: 90 tablet, Rfl: 3  polyethylene glycol (GLYCOLAX /MIRALAX ) 17 g packet, Take 1 packet by mouth daily (Patient not taking: Reported on 10/09/2023), Disp: 90 packet, Rfl: 3  albuterol  HFA 108 (90 Base) MCG/ACT inhaler, Inhale 2 puffs into the lungs every 6 (six) hours as needed for Wheezing, Disp: , Rfl:   docusate sodium  (COLACE) 100 MG capsule, TAKE 1 CAPSULE BY MOUTH IN THE MORNING AND BEFORE BEDTIME, Disp: 180 capsule, Rfl: 3  tiotropium-olodaterol (STIOLTO RESPIMAT ) 2.5-2.5 MCG/ACT inhal, Inhale 2 puffs into the lungs daily (Patient not taking: Reported on 10/09/2023), Disp: , Rfl:   buPROPion  (WELLBUTRIN  SR) 200 MG 12 hr tablet, TAKE 1 TABLET BY MOUTH IN THE MORNING AND BEFORE BEDTIME, Disp: 180 tablet, Rfl: 3    No current facility-administered medications on file prior to visit.      Mental Status/Communication: ***  Learns Best: {PTLEARNSBEST:22265}  Patient Goals: ***    Objective:   Please Note: Only populated fields were assessed by provider, fields left blank were not assessed. * denotes pain with testing.     LEFT A/PROM RIGHT A/PROM   LUMBAR       FLEX       EXT       SB       ROT     HIP       FLEX       EXT       IR       ER     KNEE       FLEX       EXT     ANKLE       DF       PF       INV       EV        LEFT STRENGTH RIGHT STRENGTH   HIP       FLEX  EXT       ABD     KNEE       FLEX       EXT     ANKLE       DF       PF       INV       EV       Muscle length:    Prone quad: *** R, *** L   90/90: *** R, *** L    Posture: ***    Special Tests: ***    Quad set: *** R, *** L  SLR: *** R, *** L    Neurological Exam:  ***   Reflexes: *** R, *** L   Sensation: *** R, *** L   Myotomes: *** R, *** L    Tenderness to Palpation: ***  Increased Tissue Density: ***  Joint Mobility: *** R, *** L    Knee Girth:   Joint Line: *** R, *** L   2 inches Infrapatellar: *** R, *** L   2 inches Suprapatellar: *** R, *** L    Ankle Girth:   Figure 8: *** R, *** L   Metatarsal Heads: *** R, *** L      Plan of Care  Referring Provider: ***  Diagnosis: ***    Assessment: ***    Pt is a 68 year old female with complains of pain in her {RIGHT-LEFT, NO CAPS:5604::bilateral} *** since ***. Pt presents today with the following problems and impairments: *** These are limiting the patient with the following functional activities: ***. Clinical presentation today is consistent with ***. Patient will benefit from skilled {Rehab Department Spell Checked:22358} focusing on improving current impairments to assist in return to prior level of function.    Co-morbidities of *** were identified and taken into considerations of plan of care.    Short Term Goals: *** weeks  ***    Long Term Goal: *** weeks  ***    Plan:  {OT/PT TREATMENT EOJW:87275}    Recommend skilled {Rehab Department Spell Checked:22358} services for *** times per week for *** weeks. Updated plan of care will be completed every 30 days.    The rehabilitation potential for this patient is {EXCELLENT/GOOD/FAIR/POOR:12734}. Clinical presentation is {PTEVALCLINICALPRES:22264} d/t ***.    Patient goals reviewed and incorporated in plan of care: {Yes/No/NA:10764::Yes}  Plan of care discussed with Patient/Family: {Yes/No/NA:10764::Yes}  Patient/Family agrees with plan of care: {Yes/No/NA:10764::Yes}  Patient/Family education provided: {Yes/No/NA:10764::Yes}  Patient/Family is aware of attendance policy: {Yes/No/NA:10764::Yes}  Patient feels safe at home: {yes no:314532}    Thank you for your referral. Please feel free to contact me with any questions or concerns.     [address] [place], [place],  [place] # [address]

## 2023-11-23 NOTE — Progress Notes (Signed)
 11/19/23 1500   Language Information   Language Needs Met By: Isadora Used Effectively By Patient   Orthopedic Precautions   DOS 05/19/23   Protocol flexion limitations please see detailed protocol in referral   Specific Restrictions locked bledsoe brace in full extension   Weight Bearing Status   LLE WBAT   Rehab Discipline   Rehab Discipline PT   Visit   Visit number 1   Recert Due Date  12/20/23   Time Calculation   Start Time 1430   Stop Time 1515   Time Calculation (min) 45 min   Pain   Pain Score   (moderate pain)   Pain Type Surgical pain   Pain Location Knee   Pain Orientation Right   Manual Therapy   Technique STM/IASTM R   Ther Exercise   Exercise QS   Ther Exercise 2   Exercise Knee flexion per protocol   Ther Exercise 3   Exercise 3 SLR R   Ther Exercise 4   Exercise 4 Hip ABD L   Ther Exercise 5   Exercise 5 Gait with immobilizer   Patient Education   What was taught? HEP/POC   Method Verbal;Demo;Practice;Written   Patient comprehension Yes       Cheryl Dixon, [place], [place] # [id number]

## 2023-11-24 ENCOUNTER — Other Ambulatory Visit (HOSPITAL_BASED_OUTPATIENT_CLINIC_OR_DEPARTMENT_OTHER): Payer: Self-pay | Admitting: Physician Assistant

## 2023-11-24 ENCOUNTER — Ambulatory Visit (HOSPITAL_BASED_OUTPATIENT_CLINIC_OR_DEPARTMENT_OTHER)

## 2023-11-24 DIAGNOSIS — Z96651 Presence of right artificial knee joint: Secondary | ICD-10-CM

## 2023-11-24 NOTE — Telephone Encounter (Signed)
 PER Pharmacy, Cheryl Dixon is a 68 year old female has requested a refill of celebrex  200.      Last Office Visit  : 10/14/2023 Jolaine Messing, PA-C  Last Tele Visit:  11/09/2023  Last Physical Exam:  05/27/2013    There are no preventive care reminders to display for this patient.    Other Med Adult:  Most Recent BP Reading(s)  10/14/23 : 126/79        Cholesterol (mg/dL)   Date Value   95/75/7975 212     LOW DENSITY LIPOPROTEIN DIRECT (mg/dL)   Date Value   95/75/7975 151     HIGH DENSITY LIPOPROTEIN (mg/dL)   Date Value   95/75/7975 47     TRIGLYCERIDES (mg/dL)   Date Value   91/82/7976 119         THYROID  SCREEN TSH REFLEX FT4 (uIU/mL)   Date Value   08/20/2022 4.100         No results found for: TSH    HEMOGLOBIN A1C (%)   Date Value   08/20/2022 5.6       No results found for: POCA1C      INR (no units)   Date Value   12/12/2021 1.1   02/16/2007 1.0 (L)   07/03/2006 < 1.0 (L)       SODIUM (mmol/L)   Date Value   06/25/2023 140       POTASSIUM (mmol/L)   Date Value   06/25/2023 4.0           CREATININE (mg/dL)   Date Value   97/72/7974 0.8       Documented patient preferred pharmacies:    [id number] - [place], [place] - [address]  Phone: [phone] Fax: [phone]

## 2023-11-25 ENCOUNTER — Other Ambulatory Visit: Payer: Self-pay | Admitting: *Deleted

## 2023-11-25 DIAGNOSIS — R6889 Other general symptoms and signs: Secondary | ICD-10-CM

## 2023-11-25 NOTE — Progress Notes (Signed)
 I certify that the documented Treatment Plan is reasonable and necessary.    11/25/2023  Kelli Egolf, PA-C

## 2023-11-26 ENCOUNTER — Other Ambulatory Visit (HOSPITAL_BASED_OUTPATIENT_CLINIC_OR_DEPARTMENT_OTHER): Payer: Self-pay | Admitting: Physician Assistant

## 2023-11-26 DIAGNOSIS — F419 Anxiety disorder, unspecified: Secondary | ICD-10-CM

## 2023-11-26 MED ORDER — CLONAZEPAM 0.5 MG PO TABS
0.5000 mg | ORAL_TABLET | Freq: Two times a day (BID) | ORAL | 1 refills | Status: DC | PRN
Start: 2023-11-26 — End: 2023-12-11

## 2023-11-26 NOTE — Telephone Encounter (Signed)
 PER Patient (self), Cheryl Dixon is a 68 year old female has requested a refill of       klonopin .  Please advise pt requested for brand only. Please review and approve if appropriate.    Previously prescribed:  Start Date 11/09/23         End Date 11/23/23             Last Office Visit  : 10/14/2023 Jolaine Messing, PA-C      Last Tele Visit:  11/09/2023      Last Physical Exam:  05/27/2013    There are no preventive care reminders to display for this patient.    Other Med Adult:  Most Recent BP Reading(s)  10/14/23 : 126/79        Cholesterol (mg/dL)   Date Value   95/75/7975 212     LOW DENSITY LIPOPROTEIN DIRECT (mg/dL)   Date Value   95/75/7975 151     HIGH DENSITY LIPOPROTEIN (mg/dL)   Date Value   95/75/7975 47     TRIGLYCERIDES (mg/dL)   Date Value   91/82/7976 119         THYROID  SCREEN TSH REFLEX FT4 (uIU/mL)   Date Value   08/20/2022 4.100         No results found for: TSH    HEMOGLOBIN A1C (%)   Date Value   08/20/2022 5.6       No results found for: POCA1C      INR (no units)   Date Value   12/12/2021 1.1   02/16/2007 1.0 (L)   07/03/2006 < 1.0 (L)       SODIUM (mmol/L)   Date Value   06/25/2023 140       POTASSIUM (mmol/L)   Date Value   06/25/2023 4.0           CREATININE (mg/dL)   Date Value   97/72/7974 0.8       Documented patient preferred pharmacies:    [id number] - [place], [place] - [address]  Phone: [phone] Fax: [phone]

## 2023-11-27 ENCOUNTER — Ambulatory Visit (HOSPITAL_BASED_OUTPATIENT_CLINIC_OR_DEPARTMENT_OTHER)

## 2023-11-27 ENCOUNTER — Encounter (HOSPITAL_BASED_OUTPATIENT_CLINIC_OR_DEPARTMENT_OTHER): Payer: Self-pay

## 2023-12-01 ENCOUNTER — Ambulatory Visit: Admitting: Family Medicine

## 2023-12-01 ENCOUNTER — Ambulatory Visit (HOSPITAL_BASED_OUTPATIENT_CLINIC_OR_DEPARTMENT_OTHER)

## 2023-12-01 ENCOUNTER — Other Ambulatory Visit (HOSPITAL_BASED_OUTPATIENT_CLINIC_OR_DEPARTMENT_OTHER): Payer: Self-pay | Admitting: Family Medicine

## 2023-12-01 DIAGNOSIS — F1120 Opioid dependence, uncomplicated: Secondary | ICD-10-CM

## 2023-12-01 MED ORDER — BUPRENORPHINE HCL-NALOXONE HCL 8-2 MG SL FILM
ORAL_FILM | SUBLINGUAL | 0 refills | Status: DC
Start: 2023-12-01 — End: 2023-12-29

## 2023-12-01 NOTE — Progress Notes (Signed)
 [facility] Follow Up Note    Assessment and Plan  Michelle Roberts is a 68 y.o. being followed at [facility] for vulvodynia, pudendal neuralgia, and pelvic floor myalgia  -She is scheduled for dorsal root ganglion stimulator  -She has not had significant benefit from Cymbalta 60mg , will wean off (40mg  x 2 weeks, then 20mg  x 2 weeks, then stop) -After taper off Cymbalta, will start Nortriptyline (10mg  x 2 weeks, 20mg  x 2 weeks, 30mg  ongoing) -Continue PFPT/HEP -Referral to pain psychology -Her QOL is not currently at goal. Discussed goal to maximize function, though her case has been quite challenging and refractory to numerous treatments/modalities.  Future Considerations: return to Amitriptyline   PLAN: Today I have prescribed: Requested Prescriptions   Signed Prescriptions Disp Refills  . DULoxetine (CYMBALTA) 20 MG capsule 42 capsule 0    Sig: Take 2 capsules (40 mg total) by mouth daily for 14 days, THEN 1 capsule (20 mg total) daily for 14 days.  . nortriptyline (PAMELOR) 10 MG capsule 90 capsule 2    Sig: Take 1 capsule (10 mg total) by mouth nightly for 14 days, THEN 2 capsules (20 mg total) nightly for 14 days, THEN 3 capsules (30 mg total) nightly.    Return in about 3 months (around 03/05/2024) for [facility] with Dr. Pamella (telemedicine).   HPI Michelle Roberts is a 67 y.o. being followed at [facility] for vulvodynia, pudendal neuralgia, and pelvic floor myalgia  Last visit (08/07/23): increased Desvenlafaxine to 100mg , continued PFPT  Interval events: she wrote in noting lack of improvement since increasing Desvenlafaxine. Cross-tapered to Duloxetine. She was considered a dorsal root ganglion stimulator. Her pain doctor switched from Gabapentin  to Lyrica.  Today:  History of Present Illness Michelle Roberts is a 68 year old female with chronic pain and pudendal neuralgia who presents for pain management and medication review.  She experiences chronic pain,  including pudendal neuralgia, which causes significant discomfort by the end of the day, regardless of activity or diet. The pain is described as persistent rectal pain, unaffected by daily activities. She uses a compounded Valium cream for rectal application as needed, which provides some relief. Additionally, she has a history of vulvodynia and lumbar pain, contributing to her overall pain experience.  Her medication regimen for pain management has included various drugs. She was previously on desvenlafaxine, which was ineffective, and has since switched to duloxetine (Cymbalta) at 60 mg daily, though she reports no noticeable improvement. She was also switched from gabapentin  to Lyrica but returned to gabapentin  due to sleep disturbances caused by Lyrica. Her current gabapentin  regimen is 2400 mg daily, divided into doses of 600 mg in the morning, 600 mg in the afternoon, and 1200 mg at night, which allows her to sleep for about five hours. She also uses Xanax, reserving doses for the evening to aid with sleep.  She is scheduled for a dorsal root ganglion stimulator trial on August 27th, having been approved on July 11th. She expresses concern about potential delays due to any need for antibiotics, which could affect the scheduling of the procedure.  Additional symptoms include eye irritation, described as feeling like 'tons of hair' in her eye, and issues with bladder and bowel function, feeling pressure and discomfort that affects her ability to determine when she needs to use the bathroom. She also mentions a sensation of not being able to get clean, feeling 'slimy' or unclean despite efforts to maintain hygiene.  Her past medical history includes  fibromyalgia and rheumatoid arthritis, which is in remission. She has been on naltrexone but questions its efficacy in allowing her other medications to work effectively. She also uses meloxicam and a muscle relaxer, though she feels these are not providing  significant relief.  She continues to engage in various therapies, including chiropractic care and pelvic therapy, though she finds the relief from these interventions to be temporary. She uses rectal dilators under the guidance of her pelvic therapist but notes limited benefit from these as well.  To review initial presentation (Jan 2025): Michelle Roberts is a female G2P2 seen in consultation at the request of McComb, Norleen Gasman, MD for evaluation and recommendations regarding Her vulvodynia   Per outside records, patient has been managed in [place] with pudendal and other nerve blocks, and symptoms have worsened rather than improved. [facility] managed with vaginal estradiol  Pertinent medications include Gabapentin , Amitriptyline , topical valium, and Naltrexone   She shares that she has had vulvodynia for >10 years, and she participated in a research study in 2015 with Dr. Zolnoun but tried to deal with it on her own for years. More recently, pain has been worsening significantly over the last 2 years (since March 2023) around when she began an exercise program and was doing lots of squats. She believes bladder and sacrum are involved as well. She has spastic pain. The spasms take over her life and tell her whether she can do things or not. The spasms come without good reason. Sometimes she can only void with deep breathing.   Current treatment includes PFPT, chiropractor, Gabapentin  600/600/1200mg , Robaxin, Xanax, Amitriptyline  35mg  nightly, Zolpidem , and topical valium She ambulates with a cane due to back pain and numbness in her legs. She also has a Water engineer, and she manages Xanax, Robaxin, Gabapentin , Amitriptyline , Zolpidem , and topical Valium. She was seen by neurologist to rule out MS, and they tried Amitriptyline  50mg , but she could not tolerate it. Told to add alpha lipoic acid, and she has. She has fibromyalgia and RA which she reports are in remission. She is treated with  naltrexone 7.5mg .   Treatment History: Amitriptyline  35mg  nightly -- helpful, unable to tolerate higher dose Gabapentin  600/600/1200mg  -- helpful but affects her memory Compounded topical valium cream 34mL/4 clicks-- helpful (uses pea-sized amount nightly PRN) Xanax -- takes two per day, helpful Naltrexone 7.5mg  -- helpful PFPT -- helpful Pudendal blocks (x4) and botox injections -- not helpful, made things worse Chiropractor Acupuncture K Laser -- not helpful Ganglion impar nerve block -- not helpful Vaginal estradiol  Desvenlafaxine -- not helpful at 100mg  dose Lyrica -- helpful but kept her awake Cymbalta -- not helpful at 60mg  dose   Pain History: Her pain started approximately  10 years ago, worse over the last 22 months.  Pain is constant Her pain is localized to the vulva and vagina Her pain is described as aching, burning, pressing, pulsing, shooting, stabbing, tender, and throbbing.   Michelle Roberts describes her pain as 10/10 in intensity at its worst, 5/10 in intensity at its least, and average pain level is  7-8 /10.  Her pain is helped by relaxation, yoga, and lying down; also helped by robaxin, xanax, tylenol , PT, chiropractor.  Pain is made worse with intercourse, bowel movement, standing, riding in a car, stress, walking, contact with clothing, urination, exercise, full bladder, and time of day.   Bladder: +Feels full after urination, difficulty passing urine, urinary frequency. No urinary urgency, dysuria. No nocturia. No incontinence (stress or urgency).  Bowels: Normal bowel habits. No melena, hematochezia or dyschezia.   Sexual intercourse: Patient is sexually active.  Pain with intercourse is pain with superficial/light touch externally, pain with deep penetration, pain with initial penetration, pain does not improve with position changes, and pain persists after intercourse for 45 min . Normal sexual response cycle.   Sleep: She does  have difficulty with sleep.  Difficulties include: trouble falling asleep and trouble staying asleep.  She sleeps  5-6  hours per night.   She reports no mood symptoms.  She does  feel safe in her current living situation.   Her treatment goals include complete resolution of the pain and no medications, complete resolution of pain with medication if necessary, and ability to return to her previous daily routine and activity.    Allergies Allergies  Allergen Reactions  . Epinephrine  Anaphylaxis and Other (See Comments)    SOB, increased HR.  Other Reaction(s): Not available, Other (See Comments), Unknown  Tachycardia from injection in Dentist Office (Intravascular?)  . Codeine  Itching and Other (See Comments)    Other Reaction(s): Not available  . Tetracycline Itching, Nausea Only and Other (See Comments)    Other Reaction(s): GI Intolerance, Not available  . Atorvastatin Calcium Muscle Pain and Other (See Comments)    In higher doses   In higher doses    Home Medications   Current Outpatient Medications  Medication Sig Dispense Refill  . alpha lipoic acid 600 mg cap 1 capsule every day by oral route.    . CONTOUR NEXT GEN METER Misc     . CONTOUR NEXT TEST STRIPS Strp     . MICROLET LANCET Misc     . allopurinol (ZYLOPRIM) 100 MG tablet     . ALPRAZolam (XANAX) 1 MG tablet Take 1 tablet (1 mg total) by mouth.    SABRA atorvastatin (LIPITOR) 40 MG tablet Take 1 tablet (40 mg total) by mouth nightly.    SABRA BIFIDOBACTERIUM ANIMALIS ORAL     . budesonide-formoterol (SYMBICORT) 160-4.5 mcg/actuation inhaler Inhale 2 puffs daily as needed.    SABRA CALCIUM CITRATE ORAL Take 1,200 mg by mouth.    . cholecalciferol, vitamin D3, (VITAMIN D3) 2,000 unit Tab Take 1 tablet (50 mcg total) by mouth.    . diazePAM (DIASTAT) 2.5 mg Kit Insert 2.5 mg into the rectum.    . docusate sodium (COLACE) 100 MG capsule Take 1 capsule (100 mg total) by mouth.    . DULoxetine (CYMBALTA) 20 MG capsule Take 2 capsules (40 mg total) by mouth  daily for 14 days, THEN 1 capsule (20 mg total) daily for 14 days. 42 capsule 0  . estradiol  (CLIMARA ) 0.05 mg/24 hr 1 patch.    . estradiol  (VAGIFEM ) 10 mcg vaginal tablet Insert 1 tablet (0.01 mg total) into the vagina Two (2) times a week.    . fexofenadine (ALLEGRA) 180 MG tablet Take 1 tablet (180 mg total) by mouth.    . gabapentin  (NEURONTIN ) 300 MG capsule Take 1 capsule (300 mg total) by mouth.    SABRA GLUTATHIONE, BULK, MISC 500 mg by Miscellaneous route daily at 0600.    . hydrocortisone (CORTEF) 20 MG tablet Take 1 tablet (20 mg total) by mouth daily.    SABRA liothyronine (CYTOMEL) 5 MCG tablet Take 2 tablets (10 mcg total) by mouth.    . magnesium 200 mg Tab Take 200 mg by mouth.    SABRA MAGNESIUM MALATE MISC 1 tablet by Miscellaneous route Two (2) times  a day.    SABRA MANGANESE ORAL Take 50 mg by mouth.    . meloxicam (MOBIC) 15 MG tablet Take 1 tablet (15 mg total) by mouth.    . mesalamine (CANASA) 1000 MG suppository INSERT 1 RECTALLY ONCE DAILY AT BEDTIME    . methocarbamol (ROBAXIN) 750 MG tablet Take 1 tablet (750 mg total) by mouth.    . montelukast (SINGULAIR) 10 mg tablet Take 1 tablet (10 mg total) by mouth nightly.    . naltrexone HCl (NALTREXONE ORAL)     . nortriptyline (PAMELOR) 10 MG capsule Take 1 capsule (10 mg total) by mouth nightly for 14 days, THEN 2 capsules (20 mg total) nightly for 14 days, THEN 3 capsules (30 mg total) nightly. 90 capsule 2  . pantoprazole  (PROTONIX ) 40 MG tablet Take 1 tablet (40 mg total) by mouth daily.    . prasterone, DHEA, (DHEA ORAL) Take by mouth.    . progesterone  (PROMETRIUM ) 100 MG capsule Take 1 capsule (100 mg total) by mouth nightly.    . RESTASIS 0.05 % ophthalmic emulsion INSTILL 1 DROP INTO EACH EYE TWICE DAILY    . SYNTHROID  150 mcg tablet     . tirzepatide  (ZEPBOUND ) 10 mg/0.5 mL injection pen Inject 10 mg under the skin every seven (7) days.    . triamterene -hydroCHLOROthiazide (MAXZIDE-25) 37.5-25 mg per tablet Take 1 tablet by  mouth daily.    . zolpidem  (AMBIEN ) 10 mg tablet Take 1 tablet (10 mg total) by mouth.     No current facility-administered medications for this visit.      Physical Exam  Well appearing on video   The patient reports they are physically located in [place]  and is currently: at home. I conducted a audio/video visit. I spent  8m 10s on the video call with the patient. I spent an additional 15 minutes on pre- and post-visit activities on the date of service .

## 2023-12-08 ENCOUNTER — Ambulatory Visit (HOSPITAL_BASED_OUTPATIENT_CLINIC_OR_DEPARTMENT_OTHER): Admitting: Orthopaedic Surgery

## 2023-12-09 ENCOUNTER — Ambulatory Visit (INDEPENDENT_AMBULATORY_CARE_PROVIDER_SITE_OTHER): Admitting: Vascular Surgery

## 2023-12-09 ENCOUNTER — Ambulatory Visit (HOSPITAL_COMMUNITY)
Admission: RE | Admit: 2023-12-09 | Discharge: 2023-12-09 | Disposition: A | Discharge status: Home / Self Care | Source: Ambulatory Visit | Attending: Vascular Surgery | Admitting: Vascular Surgery

## 2023-12-09 ENCOUNTER — Encounter: Payer: Self-pay | Admitting: Vascular Surgery

## 2023-12-09 VITALS — BP 159/92 | HR 84 | Temp 97.9°F | Resp 20 | Ht <= 58 in | Wt 200.5 lb

## 2023-12-09 DIAGNOSIS — R6889 Other general symptoms and signs: Secondary | ICD-10-CM | POA: Diagnosis present

## 2023-12-09 LAB — VAS US ABI WITH/WO TBI
Left ABI: 0.96
Right ABI: 0.97

## 2023-12-09 NOTE — Progress Notes (Signed)
 Patient ID: Michelle Roberts, female   DOB: 1956/04/20, 68 y.o.   MRN: [id number]  Reason for Consult: New Patient (Initial Visit)   Referred by Seabron Lenis, MD  Subjective:     HPI:  Michelle Roberts is a 68 y.o. female history of vascular disease.  She does have multiple other medical problems including rheumatoid arthritis, fibromyalgia and multiple pelvic issues for which she is scheduled to have a nerve stimulator placed in 2 weeks.  She was recently found on screening ABI to have abnormal ABIs and she does walk with the help of a cane.  She denies tissue loss or ulceration or flank claudication.  Past Medical History:  Diagnosis Date   Adrenal insufficiency (HCC)    Anemia    Anxiety    Arthritis    Asthma    Back pain    Cancer (HCC)    Depression    Diabetes mellitus without complication (HCC)    Fibromyalgia    Gallbladder problem    GERD (gastroesophageal reflux disease)    Hyperlipidemia    Hypertension    Hypothyroidism    thyroidectomy   Joint pain    Morton neuroma, right    per patient    Neoplasm of parotid gland    Neuromuscular disorder (HCC)    Osteoarthritis    Palpitation    Prediabetes    Rheumatoid aortitis    Sleep apnea    uses CPAP sometimes   Swelling of lower extremity    Vitamin D  deficiency    Family History  Problem Relation Age of Onset   Alzheimer's disease Mother    Hypertension Mother    Hyperlipidemia Mother    Alzheimer's disease Sister    Diabetes Brother    High Cholesterol Brother    Hypertension Brother    Diabetes Sister    Hypertension Sister    High Cholesterol Sister    Diabetes Sister    Hypertension Sister    High Cholesterol Sister    Diabetes Brother    Hypertension Brother    High Cholesterol Brother    Healthy Daughter    Healthy Daughter    Past Surgical History:  Procedure Laterality Date   ABDOMINAL HYSTERECTOMY     CHOLECYSTECTOMY     CYST REMOVAL HAND Right 07/10/2023   Procedure:  REMOVAL, CYST, HAND;  Surgeon: Arlinda Buster, MD;  Location: [facility];  Service: Orthopedics;  Laterality: Right;  RIGHT LONG FINGER MUCOUS CYST EXCISION WITH DIP JOINT DEBRIDEMENT   HAMMER TOE SURGERY     MASS EXCISION N/A 11/17/2017   Procedure: EXCISION SOFT PALATE LESION;  Surgeon: Ethyl Lonni BRAVO, MD;  Location: [facility];  Service: ENT;  Laterality: N/A;   PAROTIDECTOMY Left 04/08/2016   Procedure: LEFT SUPERFICIAL PAROTIDECTOMY WITH FACIAL NERVE DISECTION;  Surgeon: Lonni BRAVO Ethyl, MD;  Location: [facility];  Service: ENT;  Laterality: Left;   TARSAL METATARSAL ARTHRODESIS Right 02/03/2018   Procedure: RIGHT GREAT TOE  FUSION;  Surgeon: Jerri Kay HERO, MD;  Location: [facility];  Service: Orthopedics;  Laterality: Right;   THYROIDECTOMY      Short Social History:  Social History   Tobacco Use   Smoking status: Never   Smokeless tobacco: Never  Substance Use Topics   Alcohol use: No    Allergies  Allergen Reactions   Epinephrine  Other (See Comments)    Tachycardia from injection in Dentist Office (Intravascular?)  Current Outpatient Medications  Medication Sig Dispense Refill   Acetaminophen  (TYLENOL  ARTHRITIS PAIN PO) Take 650 mg by mouth 2 (two) times daily.     albuterol (VENTOLIN HFA) 108 (90 Base) MCG/ACT inhaler Inhale into the lungs every 6 (six) hours as needed for wheezing or shortness of breath.     allopurinol (ZYLOPRIM) 100 MG tablet Take 100 mg by mouth daily.     ALPRAZolam (XANAX) 1 MG tablet Take 1 mg by mouth 2 (two) times daily.     Ascorbic Acid (BL VITAMIN C PO) Take by mouth daily.     atorvastatin (LIPITOR) 40 MG tablet      b complex vitamins capsule Take 1 capsule by mouth daily.     budesonide-formoterol (SYMBICORT) 160-4.5 MCG/ACT inhaler Inhale 2 puffs into the lungs once as needed.     Calcium Carbonate-Vit D-Min (CALCIUM 1200 PO) Take by mouth.     cholecalciferol  (VITAMIN D ) 1000 UNITS tablet Take 5,000 Units by mouth daily.     Coenzyme Q10 (CO Q-10) 30 MG CAPS Take 100 mg by mouth daily.     Copper Gluconate (COPPER CAPS PO) Take 2 mg by mouth daily.     cycloSPORINE (RESTASIS) 0.05 % ophthalmic emulsion 1 drop 2 (two) times daily.     desvenlafaxine (PRISTIQ) 50 MG 24 hr tablet Take 50 mg by mouth daily.     diazepam (DIASTAT) 2.5 MG GEL Place 2.5 mg rectally once. PATIENT USES 2-3 TIMES PER WEEK FOR INTERCOURSE     docusate sodium (COLACE) 100 MG capsule Take 100 mg by mouth at bedtime.     Emollient (COLLAGEN EX) Apply topically.     estradiol  (CLIMARA  - DOSED IN MG/24 HR) 0.05 mg/24hr patch Place 0.05 mg onto the skin 2 (two) times a week.     fexofenadine (ALLEGRA) 180 MG tablet Take 180 mg by mouth daily.     fluconazole (DIFLUCAN) 200 MG tablet Take 200 mg by mouth as needed.  0   gabapentin  (NEURONTIN ) 300 MG capsule Take 300 mg by mouth 3 (three) times daily. TAKE TO CAPS BID AND 4 CAPS AT BEDTIME     hydrocortisone (CORTEF) 10 MG tablet Take 10 mg by mouth daily.     levothyroxine  (SYNTHROID ) 125 MCG tablet Take 125 mcg by mouth daily before breakfast.     LIOTRIX, T3-T4, PO Take by mouth.     magnesium gluconate (MAGONATE) 500 MG tablet Take 500 mg by mouth 2 (two) times daily.     MANGANESE PO Take by mouth daily.     Medium Chain Triglycerides (MCT OIL PO) Take by mouth.     meloxicam (MOBIC) 15 MG tablet Take 15 mg by mouth daily.  2   metFORMIN  (GLUCOPHAGE ) 500 MG tablet Take 500 mg by mouth daily.     methocarbamol (ROBAXIN) 750 MG tablet Take 750 mg by mouth 3 (three) times daily. ONE IN AM AND 2 AT BEDTIME     montelukast (SINGULAIR) 10 MG tablet Take 10 mg by mouth daily.     Multiple Vitamins-Minerals (MULTIVITAMIN ADULT PO) Take by mouth daily.     NALTREXONE HCL PO Take 7.5 mg/day by mouth daily.     Nutritional Supplements (DHEA PO) Take by mouth daily.     Omega-3 Fatty Acids (OMEGA-3 1450 PO) Take by mouth.     ONETOUCH  DELICA LANCETS 33G MISC Inject 1 strip into the skin daily.     ONETOUCH VERIO test strip Inject 1 application  into the skin daily.     oxyCODONE  (ROXICODONE ) 5 MG immediate release tablet Take 1 tablet (5 mg total) by mouth every 6 (six) hours as needed for severe pain (pain score 7-10). 10 tablet 0   pantoprazole  (PROTONIX ) 40 MG tablet      polyethylene glycol (MIRALAX / GLYCOLAX) 17 g packet Take 17 g by mouth daily.     Probiotic Product (PROBIOTIC-10 PO) Take by mouth.     progesterone  (PROMETRIUM ) 100 MG capsule Take 100 mg by mouth at bedtime.     Thiamine HCl (B-1) 100 MG TABS Take by mouth.     triamterene -hydrochlorothiazide (DYAZIDE) 37.5-25 MG capsule Take 1 capsule by mouth daily.     VAGIFEM  10 MCG TABS vaginal tablet      zolpidem  (AMBIEN ) 10 MG tablet Take 10 mg by mouth at bedtime.     No current facility-administered medications for this visit.    Review of Systems  Constitutional:  Constitutional negative. Eyes: Positive for visual disturbance.   Cardiovascular: Cardiovascular negative.  GI: Gastrointestinal negative.  GU:       Pelvic pain, protein in her urine Musculoskeletal: Positive for back pain, gait problem, leg pain and myalgias.  Neurological: Positive for numbness.  Hematologic: Hematologic/lymphatic negative.  Psychiatric: Psychiatric negative.        Objective:  Objective   Vitals:   12/09/23 1410  BP: (!) 159/92  Pulse: 84  Resp: 20  Temp: 97.9 F (36.6 C)  TempSrc: Temporal  SpO2: 100%  Weight: 200 lb 8 oz (90.9 kg)  Height: 4' 10 (1.473 m)   Body mass index is 41.9 kg/m.  Physical Exam HENT:     Head: Normocephalic.     Nose: Nose normal.     Mouth/Throat:     Mouth: Mucous membranes are moist.  Eyes:     Pupils: Pupils are equal, round, and reactive to light.  Cardiovascular:     Rate and Rhythm: Normal rate.     Pulses: Normal pulses.  Pulmonary:     Effort: Pulmonary effort is normal.  Abdominal:     General: Abdomen  is flat.  Musculoskeletal:        General: Normal range of motion.     Right lower leg: No edema.     Left lower leg: No edema.  Skin:    General: Skin is warm.     Capillary Refill: Capillary refill takes less than 2 seconds.  Neurological:     General: No focal deficit present.     Mental Status: She is alert.  Psychiatric:        Mood and Affect: Mood normal.     Data: ABI Findings:  +---------+------------------+-----+--------+--------+  Right   Rt Pressure (mmHg)IndexWaveformComment   +---------+------------------+-----+--------+--------+  Brachial 180                                      +---------+------------------+-----+--------+--------+  PTA     175               0.97 biphasic          +---------+------------------+-----+--------+--------+  DP      148               0.82 biphasic          +---------+------------------+-----+--------+--------+  Burnetta Pata  0.52 Normal            +---------+------------------+-----+--------+--------+   +---------+------------------+-----+--------+-------+  Left    Lt Pressure (mmHg)IndexWaveformComment  +---------+------------------+-----+--------+-------+  Brachial 153                                     +---------+------------------+-----+--------+-------+  PTA     173               0.96 biphasic         +---------+------------------+-----+--------+-------+  DP      151               0.84 biphasic         +---------+------------------+-----+--------+-------+  Great Toe91                0.51 Normal           +---------+------------------+-----+--------+-------+   +-------+-----------+-----------+------------+------------+  ABI/TBIToday's ABIToday's TBIPrevious ABIPrevious TBI  +-------+-----------+-----------+------------+------------+  Right 0.97       0.52                                  +-------+-----------+-----------+------------+------------+  Left  0.96       0.51                                 +-------+-----------+-----------+------------+------------+       Summary:  Right: Resting right ankle-brachial index is within normal range. The  right toe-brachial index is abnormal.   Left: Resting left ankle-brachial index is within normal range. The left  toe-brachial index is abnormal.      Assessment/Plan:    68 years female with concern for decreased pulses with abnormal ABIs.  She does have 2+ palpable pulses bilaterally and I do not think vascular insufficiency is causing any of her lower extremity symptoms.  As such she can see me on an as-needed basis and is cleared for surgical intervention as needed from a vascular standpoint.     Penne Lonni Colorado MD [facility]

## 2023-12-11 ENCOUNTER — Other Ambulatory Visit (HOSPITAL_BASED_OUTPATIENT_CLINIC_OR_DEPARTMENT_OTHER): Payer: Self-pay | Admitting: Internal Medicine

## 2023-12-11 DIAGNOSIS — F419 Anxiety disorder, unspecified: Secondary | ICD-10-CM

## 2023-12-11 MED ORDER — CLONAZEPAM 0.5 MG PO TABS
0.5000 mg | ORAL_TABLET | Freq: Two times a day (BID) | ORAL | 1 refills | Status: DC | PRN
Start: 2023-12-11 — End: 2023-12-18

## 2023-12-11 NOTE — Telephone Encounter (Signed)
 PER who is calling: Patient (self), Cheryl Dixon is a 68 year old female has requested a refill of       clonazePAM  (KLONOPIN ) 0.5 MG tablet .        Only complete for controlled medication:    Previously prescribed:  Start Date 11/26/23         End Date 12/10/23             Last Office Visit  : 10/14/2023 Jolaine Messing, PA-C      Last Tele Visit:  11/09/2023      Last Physical Exam:  05/27/2013    There are no preventive care reminders to display for this patient.    Other Med Adult:  Most Recent BP Reading(s)  10/14/23 : 126/79        Cholesterol (mg/dL)   Date Value   95/75/7975 212     LOW DENSITY LIPOPROTEIN DIRECT (mg/dL)   Date Value   95/75/7975 151     HIGH DENSITY LIPOPROTEIN (mg/dL)   Date Value   95/75/7975 47     TRIGLYCERIDES (mg/dL)   Date Value   91/82/7976 119         THYROID  SCREEN TSH REFLEX FT4 (uIU/mL)   Date Value   08/20/2022 4.100         No results found for: TSH    HEMOGLOBIN A1C (%)   Date Value   08/20/2022 5.6       No results found for: POCA1C      INR (no units)   Date Value   12/12/2021 1.1   02/16/2007 1.0 (L)   07/03/2006 < 1.0 (L)       SODIUM (mmol/L)   Date Value   06/25/2023 140       POTASSIUM (mmol/L)   Date Value   06/25/2023 4.0           CREATININE (mg/dL)   Date Value   97/72/7974 0.8       Documented patient preferred pharmacies:    [id number] - [place], [place] - [address]  Phone: [phone] Fax: [phone]

## 2023-12-18 ENCOUNTER — Other Ambulatory Visit (HOSPITAL_BASED_OUTPATIENT_CLINIC_OR_DEPARTMENT_OTHER): Payer: Self-pay | Admitting: Physician Assistant

## 2023-12-18 DIAGNOSIS — F419 Anxiety disorder, unspecified: Secondary | ICD-10-CM

## 2023-12-18 NOTE — Telephone Encounter (Signed)
 PER who is calling: Patient (self), Cheryl Dixon is a 68 year old female has requested a refill of       clonazepam .        Only complete for controlled medication:    Previously prescribed:  Start Date 12/11/23         End Date 12/18/23             Last Office Visit  : 10/14/2023 Jolaine Messing, PA-C      Last Tele Visit:  11/09/2023      Last Physical Exam:  05/27/2013    There are no preventive care reminders to display for this patient.    Other Med Adult:  Most Recent BP Reading(s)  10/14/23 : 126/79        Cholesterol (mg/dL)   Date Value   95/75/7975 212     LOW DENSITY LIPOPROTEIN DIRECT (mg/dL)   Date Value   95/75/7975 151     HIGH DENSITY LIPOPROTEIN (mg/dL)   Date Value   95/75/7975 47     TRIGLYCERIDES (mg/dL)   Date Value   91/82/7976 119         THYROID  SCREEN TSH REFLEX FT4 (uIU/mL)   Date Value   08/20/2022 4.100         No results found for: TSH    HEMOGLOBIN A1C (%)   Date Value   08/20/2022 5.6       No results found for: POCA1C      INR (no units)   Date Value   12/12/2021 1.1   02/16/2007 1.0 (L)   07/03/2006 < 1.0 (L)       SODIUM (mmol/L)   Date Value   06/25/2023 140       POTASSIUM (mmol/L)   Date Value   06/25/2023 4.0           CREATININE (mg/dL)   Date Value   97/72/7974 0.8       Documented patient preferred pharmacies:    [id number] - [place], [place] - [address]  Phone: [phone] Fax: [phone]

## 2023-12-21 ENCOUNTER — Encounter (HOSPITAL_BASED_OUTPATIENT_CLINIC_OR_DEPARTMENT_OTHER): Payer: Self-pay | Admitting: Registered Nurse

## 2023-12-21 NOTE — Telephone Encounter (Signed)
 Plan to refill close to 8/29 when due

## 2023-12-21 NOTE — Progress Notes (Signed)
 Chart review complete  OUD medication reviewed  UDS orders updated

## 2023-12-24 ENCOUNTER — Other Ambulatory Visit (HOSPITAL_BASED_OUTPATIENT_CLINIC_OR_DEPARTMENT_OTHER): Payer: Self-pay | Admitting: Physician Assistant

## 2023-12-24 DIAGNOSIS — Z96651 Presence of right artificial knee joint: Secondary | ICD-10-CM

## 2023-12-24 MED ORDER — CLONAZEPAM 0.5 MG PO TABS
0.5000 mg | ORAL_TABLET | Freq: Two times a day (BID) | ORAL | 1 refills | Status: DC | PRN
Start: 2023-12-24 — End: 2024-01-04

## 2023-12-24 NOTE — Telephone Encounter (Signed)
 PER who is calling: Pharmacy, Cheryl Dixon is a 68 year old female has requested a refill of         celecoxib  (CELEBREX ) 200 MG capsule                   Last Office Visit  : 10/14/2023 Jolaine Messing, PA-C      Last Tele Visit:  11/09/2023      Last Physical Exam:  05/27/2013    There are no preventive care reminders to display for this patient.    Other Med Adult:  Most Recent BP Reading(s)  10/14/23 : 126/79        Cholesterol (mg/dL)   Date Value   95/75/7975 212     LOW DENSITY LIPOPROTEIN DIRECT (mg/dL)   Date Value   95/75/7975 151     HIGH DENSITY LIPOPROTEIN (mg/dL)   Date Value   95/75/7975 47     TRIGLYCERIDES (mg/dL)   Date Value   91/82/7976 119         THYROID  SCREEN TSH REFLEX FT4 (uIU/mL)   Date Value   08/20/2022 4.100         No results found for: TSH    HEMOGLOBIN A1C (%)   Date Value   08/20/2022 5.6       No results found for: POCA1C      INR (no units)   Date Value   12/12/2021 1.1   02/16/2007 1.0 (L)   07/03/2006 < 1.0 (L)       SODIUM (mmol/L)   Date Value   06/25/2023 140       POTASSIUM (mmol/L)   Date Value   06/25/2023 4.0           CREATININE (mg/dL)   Date Value   97/72/7974 0.8       Documented patient preferred pharmacies:    [id number] - [place], [place] - [address]  Phone: [phone] Fax: [phone]

## 2023-12-29 ENCOUNTER — Ambulatory Visit: Admitting: Clinical

## 2023-12-29 ENCOUNTER — Emergency Department (HOSPITAL_BASED_OUTPATIENT_CLINIC_OR_DEPARTMENT_OTHER)

## 2023-12-29 ENCOUNTER — Other Ambulatory Visit: Payer: Self-pay

## 2023-12-29 ENCOUNTER — Emergency Department
Admission: EM | Admit: 2023-12-29 | Discharge: 2023-12-29 | Disposition: A | Discharge status: Home / Self Care | Attending: Emergency Medicine | Admitting: Emergency Medicine

## 2023-12-29 ENCOUNTER — Ambulatory Visit: Admitting: Family Medicine

## 2023-12-29 ENCOUNTER — Other Ambulatory Visit (HOSPITAL_BASED_OUTPATIENT_CLINIC_OR_DEPARTMENT_OTHER): Payer: Self-pay | Admitting: Family Medicine

## 2023-12-29 DIAGNOSIS — S8001XA Contusion of right knee, initial encounter: Secondary | ICD-10-CM | POA: Diagnosis not present

## 2023-12-29 DIAGNOSIS — S8991XA Unspecified injury of right lower leg, initial encounter: Secondary | ICD-10-CM | POA: Diagnosis not present

## 2023-12-29 DIAGNOSIS — S4992XA Unspecified injury of left shoulder and upper arm, initial encounter: Secondary | ICD-10-CM

## 2023-12-29 DIAGNOSIS — Y92002 Bathroom of unspecified non-institutional (private) residence single-family (private) house as the place of occurrence of the external cause: Secondary | ICD-10-CM | POA: Insufficient documentation

## 2023-12-29 DIAGNOSIS — Z96651 Presence of right artificial knee joint: Secondary | ICD-10-CM | POA: Diagnosis not present

## 2023-12-29 DIAGNOSIS — W010XXA Fall on same level from slipping, tripping and stumbling without subsequent striking against object, initial encounter: Secondary | ICD-10-CM | POA: Insufficient documentation

## 2023-12-29 DIAGNOSIS — M25512 Pain in left shoulder: Secondary | ICD-10-CM | POA: Diagnosis not present

## 2023-12-29 DIAGNOSIS — G8911 Acute pain due to trauma: Secondary | ICD-10-CM | POA: Diagnosis not present

## 2023-12-29 DIAGNOSIS — M25561 Pain in right knee: Secondary | ICD-10-CM | POA: Diagnosis present

## 2023-12-29 DIAGNOSIS — F1120 Opioid dependence, uncomplicated: Secondary | ICD-10-CM

## 2023-12-29 MED ORDER — ACETAMINOPHEN 500 MG PO TABS
1000.0000 mg | ORAL_TABLET | Freq: Once | ORAL | Status: AC
Start: 2023-12-29 — End: 2023-12-29
  Administered 2023-12-29: 1000 mg via ORAL
  Filled 2023-12-29: qty 2

## 2023-12-29 MED ORDER — BUPRENORPHINE HCL-NALOXONE HCL 8-2 MG SL FILM
ORAL_FILM | SUBLINGUAL | 0 refills | Status: DC
Start: 2023-12-29 — End: 2024-02-02

## 2023-12-29 MED ORDER — LIDOCAINE 5 % EX PTCH
1.0000 | MEDICATED_PATCH | CUTANEOUS | 0 refills | Status: AC
Start: 2023-12-29 — End: 2024-01-12

## 2023-12-29 MED ORDER — LIDOCAINE 4 % EX PTCH
1.0000 | MEDICATED_PATCH | CUTANEOUS | Status: DC
Start: 2023-12-29 — End: 2023-12-29
  Administered 2023-12-29: 1 via TOPICAL
  Filled 2023-12-29: qty 1

## 2023-12-29 MED ORDER — KETOROLAC TROMETHAMINE 30 MG/ML INJ
30.0000 mg | Freq: Once | Status: AC
Start: 2023-12-29 — End: 2023-12-29
  Administered 2023-12-29: 30 mg via INTRAMUSCULAR
  Filled 2023-12-29: qty 1

## 2023-12-29 NOTE — Discharge Instructions (Signed)
 You were seen here today for evaluation of fall with knee injury.  X-rays of your right femur, knee and left shoulder are negative for acute fracture.  We have placed you in a knee immobilizer and given you lidocaine  patches, Toradol  and Tylenol  for pain.  Follow-up with orthopedics at your scheduled appointment or sooner if you develop worsening pain or swelling.  Apply ice and elevate the leg and continue with Tylenol  1000 mg every 6 hours for pain, your daily dose of Celebrex  and the lidocaine  patches for once daily 12 hours on 12 hours off.

## 2023-12-29 NOTE — Narrator Note (Signed)
 Patient Disposition  Patient education for diagnosis, medications, activity, diet and follow-up.  Patient left ED 12:43 PM.  Patient rep received written instructions.    Interpreter to provide instructions: No    Patient belongings with patient: YES    Have all existing LDAs been addressed? No    Have all IV infusions been stopped? No    Destination: Discharged to home. Ems to transort
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
## 2023-12-29 NOTE — ED Triage Note (Signed)
 Arrives via [facility] from home with right knee pain S/p fall at home landing on knee. Reports history of surgery, knee replacement on the knee she fell on

## 2023-12-29 NOTE — Narrator Note (Signed)
 T given knee immoblizer and lunch /

## 2023-12-31 ENCOUNTER — Other Ambulatory Visit (HOSPITAL_BASED_OUTPATIENT_CLINIC_OR_DEPARTMENT_OTHER): Payer: Self-pay | Admitting: Internal Medicine

## 2023-12-31 DIAGNOSIS — N3281 Overactive bladder: Secondary | ICD-10-CM

## 2023-12-31 NOTE — Telephone Encounter (Signed)
 PER who is calling: Pharmacy, Cheryl Dixon is a 68 year old female has requested a refill of       solifenacin .          Last Office Visit  : 10/14/2023 Jolaine Messing, PA-C      Last Tele Visit:  11/09/2023      Last Physical Exam:  05/27/2013    There are no preventive care reminders to display for this patient.    Other Med Adult:  Most Recent BP Reading(s)  12/29/23 : 185/90        Cholesterol (mg/dL)   Date Value   95/75/7975 212     LOW DENSITY LIPOPROTEIN DIRECT (mg/dL)   Date Value   95/75/7975 151     HIGH DENSITY LIPOPROTEIN (mg/dL)   Date Value   95/75/7975 47     TRIGLYCERIDES (mg/dL)   Date Value   91/82/7976 119         THYROID  SCREEN TSH REFLEX FT4 (uIU/mL)   Date Value   08/20/2022 4.100         No results found for: TSH    HEMOGLOBIN A1C (%)   Date Value   08/20/2022 5.6       No results found for: POCA1C      INR (no units)   Date Value   12/12/2021 1.1   02/16/2007 1.0 (L)   07/03/2006 < 1.0 (L)       SODIUM (mmol/L)   Date Value   06/25/2023 140       POTASSIUM (mmol/L)   Date Value   06/25/2023 4.0           CREATININE (mg/dL)   Date Value   97/72/7974 0.8       Documented patient preferred pharmacies:    [id number] - [place], [place] - [address]  Phone: [phone] Fax: [phone]

## 2024-01-01 ENCOUNTER — Telehealth (HOSPITAL_BASED_OUTPATIENT_CLINIC_OR_DEPARTMENT_OTHER): Payer: Self-pay

## 2024-01-01 NOTE — Telephone Encounter (Signed)
 I agree with plan

## 2024-01-01 NOTE — Telephone Encounter (Signed)
 Pt's daughter Glade called stating Pt fell and went to ED 9/2. Pt requests Esposito or team member phone call re imaging and to check in. Please advise.     Please call Pt at [phone] to advise.       I called pt to see how she is doing today sp a fall & trip to the ER.  Per ER: Exam is not consistent with patellar or quadriceps tendon rupture. X-rays of the right femur, knee and left shoulder do not show any obvious fracture or hardware malfunction. Pt states she's not sure why she falls so much.  She relates a family hx her Mother having multiple mini strokes  Pt has had one CVA in 2023.  Pt has frequent eye/facial twitches.  Pt referred to her PCP Dr Renda.    Pt encouraged to use her walker for stability Her pain is controled by tylenol  & Celebrex ..  She has an appointment with Dr Lonia 01/07/24 at 1:30.  Pt is aware of her f/u appointment.

## 2024-01-04 ENCOUNTER — Telehealth (HOSPITAL_BASED_OUTPATIENT_CLINIC_OR_DEPARTMENT_OTHER): Payer: Self-pay | Admitting: Internal Medicine

## 2024-01-04 ENCOUNTER — Telehealth (HOSPITAL_BASED_OUTPATIENT_CLINIC_OR_DEPARTMENT_OTHER): Payer: Self-pay

## 2024-01-04 DIAGNOSIS — F419 Anxiety disorder, unspecified: Secondary | ICD-10-CM

## 2024-01-04 MED ORDER — CLONAZEPAM 0.5 MG PO TABS
0.5000 mg | ORAL_TABLET | Freq: Two times a day (BID) | ORAL | 1 refills | Status: DC | PRN
Start: 2024-01-07 — End: 2024-01-22

## 2024-01-04 NOTE — Telephone Encounter (Signed)
 Cheryl Dixon  [id number]  [facility] [[id number]]    Please let patient know that I have refilled their medication, and they should have an appointment in the next month for me to continue filling this medication appropriately.    Please assist this patient with the following:    Schedule follow-up in person or televisit per patient preference, for anxiety, time frame within 1 month, with Analeise Mccleery only, if appointment not available: OK to use held slot

## 2024-01-04 NOTE — Telephone Encounter (Signed)
 PER who is calling: Pharmacy, Cheryl Dixon is a 68 year old female has requested a refill of         clonazePAM  (KLONOPIN ) 0.5 MG tablet          Only complete for controlled medication:    Previously prescribed:  Start Date 12/24/23         End Date 01/07/24             Last Office Visit  : 10/14/2023 Jolaine Messing, PA-C      Last Tele Visit:  11/09/2023      Last Physical Exam:  05/27/2013    There are no preventive care reminders to display for this patient.    Other Med Adult:  Most Recent BP Reading(s)  12/29/23 : 185/90        Cholesterol (mg/dL)   Date Value   95/75/7975 212     LOW DENSITY LIPOPROTEIN DIRECT (mg/dL)   Date Value   95/75/7975 151     HIGH DENSITY LIPOPROTEIN (mg/dL)   Date Value   95/75/7975 47     TRIGLYCERIDES (mg/dL)   Date Value   91/82/7976 119         THYROID  SCREEN TSH REFLEX FT4 (uIU/mL)   Date Value   08/20/2022 4.100         No results found for: TSH    HEMOGLOBIN A1C (%)   Date Value   08/20/2022 5.6       No results found for: POCA1C      INR (no units)   Date Value   12/12/2021 1.1   02/16/2007 1.0 (L)   07/03/2006 < 1.0 (L)       SODIUM (mmol/L)   Date Value   06/25/2023 140       POTASSIUM (mmol/L)   Date Value   06/25/2023 4.0           CREATININE (mg/dL)   Date Value   97/72/7974 0.8       Documented patient preferred pharmacies:    [id number] [place], [place] - [address]  Phone: [phone] Fax: [phone]

## 2024-01-04 NOTE — Telephone Encounter (Signed)
 Cheryl Dixon [id number], 68 year old, female   Telephone Information:   Home Phone [phone]   Work Phone Not on file.   Mobile [phone]       Calls today: Forms  PT1 form Patient requesting a PT-1 [facility] Form  Name of treating provider/facility: [facility]  Street Address:[address] City/Town:[place] State:[place] Zip: [phone]    6 visit(s) per month  Member requires door through door or room to room service? Yes  Member will be transported in a wheel chair? Manual Wheel Chair  Member has a service animal? No  Member has an escort? Yes  Member is bariatric? No  Transportation request is for applied behavioral analysis? No    Patient Pick Up Address [address]  City/Town [place] Zip [address]    Calls today: Forms  PT1 form Patient requesting a PT-1 [facility] Form  Name of treating provider/facility: [facility]  Street Address:[address] [place] Zip: [phone]    6 visit(s) per month  Member requires door through door or room to room service? Yes  Member will be transported in a wheel chair? Manual Wheel Chair  Member has a service animal? No  Member has an escort? Yes  Member is bariatric? No  Transportation request is for applied behavioral analysis? No    Patient Pick Up Address [address]  City/Town [place] Zip [address]    Calls today: Forms  PT1 form Patient requesting a PT-1 [facility] Form  Name of treating provider/facility: [facility]  Street Address:[address] City/Town:[place] State:[place] Zip:     6 visit(s) per month  Member requires door through door or room to room service? Yes  Member will be transported in a wheel chair? Manual Wheel Chair  Member has a service animal? No  Member has an escort? Yes  Member is bariatric? No  Transportation request is for applied behavioral analysis? No    Patient Pick Up Address [address]  City/Town [place] Zip  [address]    Calls today: Forms  PT1 form Patient requesting a PT-1 [facility] Form  Name of treating provider/facility: [facility]  Street Address:[address] City/Town:[place] State:[place] Zip: [address]    6 visit(s) per month  Member requires door through door or room to room service? Yes  Member will be transported in a wheel chair? Manual Wheel Chair  Member has a service animal? No  Member has an escort? Yes  Member is bariatric? No  Transportation request is for applied behavioral analysis? No    Patient Pick Up Address [address]  City/Town [place] Zip [address]    Person calling on behalf of patient: who is calling: Daughter    GWENITH SANES NUMBER: [phone]       Patient's language of care: English    Patient does not need an interpreter.    Patient's PCP: [name], MD    Primary Care Home Site:  [facility]

## 2024-01-05 ENCOUNTER — Ambulatory Visit: Attending: Clinical | Admitting: Clinical

## 2024-01-05 ENCOUNTER — Encounter (HOSPITAL_BASED_OUTPATIENT_CLINIC_OR_DEPARTMENT_OTHER): Payer: Self-pay

## 2024-01-05 ENCOUNTER — Ambulatory Visit: Attending: Family Medicine | Admitting: Family Medicine

## 2024-01-05 ENCOUNTER — Encounter (HOSPITAL_BASED_OUTPATIENT_CLINIC_OR_DEPARTMENT_OTHER): Payer: Self-pay | Admitting: Registered Nurse

## 2024-01-05 DIAGNOSIS — F1120 Opioid dependence, uncomplicated: Secondary | ICD-10-CM | POA: Insufficient documentation

## 2024-01-05 DIAGNOSIS — F3341 Major depressive disorder, recurrent, in partial remission: Secondary | ICD-10-CM | POA: Diagnosis present

## 2024-01-05 NOTE — Progress Notes (Signed)
 Cheryl Dixon is a 68 year old female seen in follow up for opioid use disorder:    Buprenorphine  dose: 3.5 films a day rx'ed (previously 2.5 films a day - increased iso pain, arthritis, surgeries)  Response, adequacy of dose: as above  Relapses/close calls: No  Meetings: attends every 4+ week  Non-prescribed substance use: None reported    Social history/events:  None    Additional psychiatric or medical care/events:  Struggling with knee pain and arm pain after recent fall    Present Medications:  [START ON 01/07/2024] clonazePAM  (KLONOPIN ) 0.5 MG tablet, Take 1 tablet by mouth 2 (two) times daily as needed for Anxiety  for up to 14 days, Disp: 14 tablet, Rfl: 1  solifenacin  (VESICARE ) 10 MG tablet, TAKE 1 TABLET BY MOUTH EVERY DAY IN THE MORNING, Disp: 90 tablet, Rfl: 0  buprenorphine -naloxone  (SUBOXONE ) 8-2 MG sublingual film, Take 1.5 films under the tongue in the morning, 1 film midday, and 1 film at night, Disp: 98 Film, Rfl: 0  lidocaine  (LIDODERM ) 5 % patch, Place 1 patch onto the skin in the morning for 14 days. . Patch(es) may remain in place for up to 12 hours in any 24-hour period., Disp: 14 patch, Rfl: 0  celecoxib  (CELEBREX ) 200 MG capsule, Take 1 capsule by mouth daily, Disp: 30 capsule, Rfl: 2  cetirizine  (ZYRTEC ) 10 MG tablet, TAKE 1 TABLET BY MOUTH EVERY DAY IN THE MORNING, Disp: 90 tablet, Rfl: 3  OTHER MEDICATION, SHOWER CHAIR  For use as directed.    DX: gait instability, OA knee  ICD10:R26.81, M17.9 (Patient not taking: Reported on 10/09/2023), Disp: 1 each, Rfl: 0  calcium  carb-cholecalciferol  500-10 MG-MCG TABS tablet, Take 1 tablet by mouth in the morning and 1 tablet before bedtime., Disp: 180 tablet, Rfl: 3  magnesium  oxide (MAG-OX) 400 (240 Mg) MG tablet, Take 1 tablet by mouth daily, Disp: 90 tablet, Rfl: 3  acetaminophen  (TYLENOL ) 500 MG tablet, Take 1 tablet by mouth every 8 (eight) hours as needed for Pain, Disp: 549 tablet, Rfl: 0  aspirin  (ASPIRIN  81) 81 MG chewable tablet, Take 1  tablet by mouth in the morning and 1 tablet before bedtime. (Patient not taking: Reported on 10/09/2023), Disp: , Rfl:   estradiol  (ESTRACE ) 0.1 MG/GM vaginal cream, PLACE 5 G VAGINALLY 3 TIMES A WEEK MYLAN BRAND ONLY., Disp: 42.5 g, Rfl: 5  Cholecalciferol  (VITAMIN D3) 50 MCG (2000 UT) TABS, TAKE 1 TABLET BY MOUTH EVERY DAY IN THE MORNING, Disp: 90 tablet, Rfl: 3  atorvastatin  (LIPITOR) 80 MG tablet, Take 1 tablet by mouth daily after dinner, Disp: 90 tablet, Rfl: 3  polyethylene glycol (GLYCOLAX /MIRALAX ) 17 g packet, Take 1 packet by mouth daily (Patient not taking: Reported on 10/09/2023), Disp: 90 packet, Rfl: 3  albuterol  HFA 108 (90 Base) MCG/ACT inhaler, Inhale 2 puffs into the lungs every 6 (six) hours as needed for Wheezing, Disp: , Rfl:   docusate sodium  (COLACE) 100 MG capsule, TAKE 1 CAPSULE BY MOUTH IN THE MORNING AND BEFORE BEDTIME, Disp: 180 capsule, Rfl: 3  tiotropium-olodaterol (STIOLTO RESPIMAT ) 2.5-2.5 MCG/ACT inhal, Inhale 2 puffs into the lungs daily (Patient not taking: Reported on 10/09/2023), Disp: , Rfl:   buPROPion  (WELLBUTRIN  SR) 200 MG 12 hr tablet, TAKE 1 TABLET BY MOUTH IN THE MORNING AND BEFORE BEDTIME, Disp: 180 tablet, Rfl: 3    No current facility-administered medications on file prior to visit.      Last UDS:  LAST UDS   AMPHETAMINES URINE (ng/mL)  Date Value   10/27/2022 NEGATIVE     COCAINE  METABOLITES URINE (ng/mL)   Date Value   10/27/2022 NEGATIVE     OPIATES URINE (ng/mL)   Date Value   10/27/2022 NEGATIVE     BENZODIAZEPINES URINE (ng/mL)   Date Value   10/27/2022 NEGATIVE     CANNABINOIDS URINE (ng/mL)   Date Value   10/27/2022 POSITIVE (A)     ETHANOL URINE (mg/dL)   Date Value   93/92/7976 NEGATIVE       Review of Systems: no notable symptoms    PHYSICAL EXAMINATION: by video  General appearance - healthy female in no distress  Eyes - pupils normal  Skin - warm and dry  Neuro - nonfocal  Affect - normal    Problem List Items Addressed This Visit          Mental Health     Opioid use disorder on agonist therapy (HCC)         Today in group we discussed individual experiences in recovery. Cheryl Dixon contributed.    Assessment:  Opioid use disorder  Comment: the patient actively participated and shared experiences, interacting appropriately with group without barriers to understanding  Plan:  Continue suboxone  to 3.5 films daily dose divided TID (1.5 films in AM, 1 film mid day, and 1 film at night).  There are days she takes 3 films a day but others where she takes 3.5 films a day if needed for pain control.  Patient is counseled regarding relapse prevention, involvement in recovery groups, and potential side effects of buprenorphine .    Knee, arm pain - plan to schedule primary care visit, PCP updated    Cheryl Burton, MD, 01/05/2024

## 2024-01-05 NOTE — Progress Notes (Signed)
 Pt was present for today's online google group for suboxone .  States she is doing ok with her suboxone .  No issues at all.  She is struggling a bit still with her knee and bilateral arm discomfort.  She is requesting to speak with Dr Renda and possibly have an appointment.  He is aware of her request.

## 2024-01-05 NOTE — Telephone Encounter (Signed)
 Select Member Name DOB Street [place] Zip Eligible for Transportation?      Cheryl Dixon 19-Sep-1955 [address] [place] [address] [place]      Pt needs to call insurance for transportation. Pt-1 only available for [facility] members.

## 2024-01-06 ENCOUNTER — Telehealth (HOSPITAL_BASED_OUTPATIENT_CLINIC_OR_DEPARTMENT_OTHER): Payer: Self-pay

## 2024-01-06 NOTE — Telephone Encounter (Signed)
 I spoke to pt  Pt notes 7 falls in last year  Last week- getting in tub, good foot 1st, used grab bar and bad foot gave out.   I let pt know I would inform provider she is seeing tomorrow.   Plan- pt will see ortho and inform,(RN will send message but not sure it will get seen)  Pt will call us  tomorrow to report on how it/goes.  Of note pt needs to speak to RN about some other issues/needs but needs a more in depth conversation.

## 2024-01-06 NOTE — Telephone Encounter (Signed)
 I think she needs to do some PT, who can also help evaluate why she is falling so much. Arleigh referred her again in June but I don't see anything booked.    If she needs assistance at home I'd recommend putting her in touch with [facility]:  [email or web address]

## 2024-01-06 NOTE — Progress Notes (Signed)
 Requesting accomodation for sooner appt with pcp

## 2024-01-06 NOTE — Telephone Encounter (Signed)
 Cheryl Dixon [id number], 68 year old, female   Telephone Information:   Home Phone [phone]   Work Phone Not on file.   Mobile [phone]       Calls today: requesting sooner appt with pcp as she has had multiple falls recently     Person calling on behalf of patient: who is calling: Patient (self)    CALL BACK NUMBER: [phone]       Patient's language of care: English    Patient does not need an interpreter.    Patient's PCP: [name], MD    Primary Care Home Site:  [facility]

## 2024-01-07 ENCOUNTER — Ambulatory Visit (HOSPITAL_BASED_OUTPATIENT_CLINIC_OR_DEPARTMENT_OTHER): Admitting: Orthopaedic Surgery

## 2024-01-07 NOTE — Telephone Encounter (Signed)
 I spoke to pt and discussed the following  Pt notes she didn't have insurance but now is back with [facility]  She knows she can PT with them and they come to the house.  She is speaking with her care partner alexa this week and will discuss care/resources.  It anything is need from us  pt notes she will call us .  Note- pt missed appt today due to transportation.

## 2024-01-10 NOTE — Progress Notes (Signed)
 OUTPATIENT PSYCHIATRY GROUP PROGRESS NOTE    For the patient participating in the group via telephone and/or video technology: The patient/guardian has verbally consented to participate in this group therapy session by televisit. The patient/guardian was encouraged to be in a private location due to personal health information being discussed and where they could not be overheard by individuals not participating in the group therapy. Although all [facility] staff/providers participating in this group therapy televisit are in a private location, all the risks of telephone and/or video technology used to conduct this group therapy session cannot be controlled by Gerty, (i.e. interruptions, unauthorized access/recording and technical difficulties).  Each patient understands that the visit can be stopped at any time for any reason, including if the conferencing connections are inadequate.    Cheryl Dixon is a 68 year old, Divorced, female, who presented for [facility] group visit.   Length of group: 60 min which included 30 min of Health Behavior Intervention and OUD Psychoeducation.    Group Name: OBOT: Office Based Opioid Treatment Group    Group Topic Today: Recovery Experience: Challenges, Strengths, and Coping Skills.     Group Description: This is a supportive, PCBHI team-led recovery group for participants working on maintaining wellness, navigating OUD recovery, and building meaningful lives. Group sessions focus on reflection, connection, and developing practical strategies for personal growth. Members are encouraged to share openly, offer mutual support, and explore themes such as identity, coping skills, relationships, purpose, and resilience. Each week, we introduce a focused discussion topic to guide conversation and foster deeper insight. The group offers a consistent space for members to check in, set intentions, and stay grounded in their recovery goals while learning from one another's experiences. Today, group members  shared and discussed their recovery experience. Group members explored strategies to reduce relapse risks and increase coping mechanisms to support healthy change.      Leaders:  Estefana Burton, MD, Vint Pola, Ph.D., Arnulfo Ohara, RN    Service Type: [phone] Group Psychotherapy     Number of participants in group today:  12        Purpose of Group (choose all that apply):   Skills training/rehab  Support (psychological, family, community resources, recovery)  Insight and behavior change  Increasing health promoting behaviors  Relapse prevention and enhancing Recovery Process  Education about Opioid Use Disorder (OUD) and treatment  Enhancing adjustment and coping with OUD  Improving management of OUD and treatment adherence  Decreasing health risk behaviors and risks of relapse  Addressing psychological/psychosocial/behavioral barriers to preventing addiction and relapse        Group Process:    Review of group goals/structure  Education about Opioid Use Disorder (OUD) and related topics  Group discussion and sharing.    Review of strategies to increase healthy coping mechanisms to support recovery and change.  Review of additional relapse prevention strategies.   Discussion of support strategies  Check in    Individual Patient Participation (select all that apply):  [x]   Attentive to process  [x]   Contributed to group discussion  [x]   Shared personal experience related to the group topic.    Subjective report of pt's progress: Pt participated in group discussion. She reported ongoing difficulties related to a leg issue and shared that she has undergone two surgeries. She shared that she recently experienced two falls, including one in the shower, and currently has a swollen arm. Cheryl Dixon described episodes of becoming stuck in a chair and acknowledged needing assistance at home,  which she currently has in place. She continues to smoke but engaged in discussion about the importance of quitting.  Social History  update: no major updates reported  Report on relapses and close calls:  No relapse/close calls disclosed.     Medical Necessity of Session (how treatment is necessary to improve symptoms, functioning, or prevent worsening): treatment is necessary to enhance compliance with agonist treatment and maintain abstinence from opioids. Suboxone  group participation is mandatory to continue Suboxone  prescription and is necessary in order to enhance coping with Opioid Use Disorder.      Relevant Changes in Mental Status: No. No acute issues reported or noted     Risk Level per Scale:  Suicide: re-assessment not indicated today  Violence: re-assessment not indicated today  Addiction: low     Current risk level represents increase in risk: No.     Plan: Pt will continue OBOT treatment     DIAGNOSES:     Opioid dependence on agonist therapy (HCC)   SNOMED CT(R): OPIOID DEPENDENCE, ON AGONIST THERAPY     MDD (major depressive disorder), recurrent, in partial remission (HCC)   SNOMED CT(R): RECURRENT MAJOR DEPRESSION IN PARTIAL REMISSION      REVIEWING TODAY'S VISIT:   Reviewing Today's Visit:  Clinical Interventions Today: Motivational Interviewing; Cognitive Behavioral Therapy (CBT); Relapse Prevention for SUDs  Patient's Response to Interventions: Responsive  Time Spent in Psychotherapy: 60 min            CARE PLAN/ EPISODES:  Linked Episodes   Type: Episode: Status: Noted: Resolved: Last update: Updated by:   PCBHI PCBHI OBOT  Active 09/11/2020  01/10/2024 10:38 PM [name], PhD      Comments:       Patient/Guardian:  contributed to the creation of the treatment plan.    Strengths/Skills: Help seeking and motivated to get better     Potential Barriers: Multiple life stressors       Patient stated goals: Pt would like to prevent relapses and support her recovery process     Problem 1: Opioid Dependence on Agonist Therapy (Opioid Use Disorder on Agonist Therapy)      Short Term Goals: Pt will use at least one copying skill  a day to reduce a risk of relapse, enhance coping with OUD, and support her recovery process     Short Term Target:  60 days  Short TermTarget Date: 03/05/2024  Short Term Goal #1 Progress: progressing     Long Term Goals: Pt will report 50% in urges and cravings reduction and no relapses or close calls       Long Term Target:  90 days  Long TermTarget Date:  04/04/2024   Long Term Goal #1 Progress:  progressing      Intervention #1: PCBHI-based OBOT/Group Psychotherapy   Intervention Frequency:  biweekly  Intervention Duration:  90 Days  Intervention Responsibility:  J. Raynell Scott, PhD and the OBOT team  Mounir Skipper, PhD                                 .

## 2024-01-10 NOTE — BH OP Treatment Plan (Signed)
 Patient/Guardian:  contributed to the creation of the treatment plan.    Strengths/Skills: Help seeking and motivated to get better     Potential Barriers: Multiple life stressors       Patient stated goals: Pt would like to prevent relapses and support her recovery process     Problem 1: Opioid Dependence on Agonist Therapy (Opioid Use Disorder on Agonist Therapy)      Short Term Goals: Pt will use at least one copying skill a day to reduce a risk of relapse, enhance coping with OUD, and support her recovery process     Short Term Target:  60 days  Short TermTarget Date: 03/05/2024  Short Term Goal #1 Progress: progressing     Long Term Goals: Pt will report 50% in urges and cravings reduction and no relapses or close calls       Long Term Target:  90 days  Long TermTarget Date:  04/04/2024   Long Term Goal #1 Progress:  progressing      Intervention #1: PCBHI-based OBOT/Group Psychotherapy   Intervention Frequency:  biweekly  Intervention Duration:  90 Days  Intervention Responsibility:  J. Aitana Burry, PhD and the OBOT team

## 2024-01-13 ENCOUNTER — Telehealth (HOSPITAL_BASED_OUTPATIENT_CLINIC_OR_DEPARTMENT_OTHER): Payer: Self-pay

## 2024-01-13 NOTE — Telephone Encounter (Signed)
 Cheryl Dixon [id number], 68 year old, female   Telephone Information:   Home Phone [phone]   Work Phone Not on file.   Mobile [phone]     Calling in regards to a referral for physical therapy states she wants to know how it works if there is maybe someone that could come to the patient's home  Person calling on behalf of patient: who is calling: Daughter    GWENITH SANES NUMBER: [phone]  Best time to call back: anytime   Cell phone:   Other phone:    Patient's language of care: English    Patient does not need an interpreter.    Patient's PCP: [name], MD    Primary Care Home Site:  [facility]

## 2024-01-14 NOTE — Telephone Encounter (Signed)
 I spoke to pt daughter- she is looking for pt to have PT in home.  She is going to call [facility] and see what is needed from us  for this to happen.  She notes she will call us  when she finds out.

## 2024-01-15 ENCOUNTER — Telehealth (HOSPITAL_BASED_OUTPATIENT_CLINIC_OR_DEPARTMENT_OTHER): Payer: Self-pay

## 2024-01-15 ENCOUNTER — Telehealth (HOSPITAL_BASED_OUTPATIENT_CLINIC_OR_DEPARTMENT_OTHER): Payer: Self-pay | Admitting: Registered Nurse

## 2024-01-15 NOTE — Telephone Encounter (Signed)
 Pt calling noting uncontrolled pain.  Is on celebrex , taking apap often.  Pt notes bilateral shoulder pain daily, and that it's hard to elevate arms.  Seeing ortho for knee issues but not for a bit and pt notes she would really like to see dr.roll.  Appt booked.

## 2024-01-15 NOTE — Telephone Encounter (Signed)
 Select Member Name DOB Street [place] Zip Eligible for Transportation?      Cheryl Dixon 1955/08/23 [address] [place] [address] [place]      Patient daughter called in for status of pt1.   Patient is inelgible for PT1's

## 2024-01-15 NOTE — Telephone Encounter (Signed)
-----   Message from [place] sent at 01/15/2024  9:47 AM EDT -----  Regarding: requesting call back  Hello,    Pt requesting a call back from one of the nurses- she did not mention what it was regarding    Please assist  Thank you

## 2024-01-18 ENCOUNTER — Ambulatory Visit: Attending: Internal Medicine | Admitting: Internal Medicine

## 2024-01-18 ENCOUNTER — Ambulatory Visit: Admitting: Orthopaedic Surgery

## 2024-01-18 ENCOUNTER — Other Ambulatory Visit: Payer: Self-pay

## 2024-01-18 VITALS — BP 170/97 | HR 76 | Temp 97.8°F | Ht 61.0 in | Wt 206.0 lb

## 2024-01-18 DIAGNOSIS — Z96651 Presence of right artificial knee joint: Secondary | ICD-10-CM | POA: Diagnosis present

## 2024-01-18 DIAGNOSIS — Z7189 Other specified counseling: Secondary | ICD-10-CM | POA: Insufficient documentation

## 2024-01-18 DIAGNOSIS — M7989 Other specified soft tissue disorders: Secondary | ICD-10-CM | POA: Insufficient documentation

## 2024-01-18 DIAGNOSIS — S86811S Strain of other muscle(s) and tendon(s) at lower leg level, right leg, sequela: Secondary | ICD-10-CM | POA: Diagnosis present

## 2024-01-18 LAB — COMPREHENSIVE METABOLIC PANEL
ALANINE AMINOTRANSFERASE: 11 U/L — ABNORMAL LOW (ref 12–45)
ALBUMIN: 4.4 g/dL (ref 3.4–5.2)
ALKALINE PHOSPHATASE: 99 U/L (ref 45–117)
ANION GAP: 11 mmol/L (ref 10–22)
ASPARTATE AMINOTRANSFERASE: 19 U/L (ref 8–34)
BILIRUBIN TOTAL: 0.2 mg/dL (ref 0.2–1.0)
BUN (UREA NITROGEN): 20 mg/dL — ABNORMAL HIGH (ref 7–18)
CALCIUM: 9.2 mg/dL (ref 8.5–10.5)
CARBON DIOXIDE: 28 mmol/L (ref 21–32)
CHLORIDE: 101 mmol/L (ref 98–107)
CREATININE: 0.9 mg/dL (ref 0.4–1.2)
ESTIMATED GLOMERULAR FILT RATE: >60 mL/min (ref >60)
Glucose Random: 108 mg/dL (ref 74–160)
POTASSIUM: 4 mmol/L (ref 3.5–5.1)
SODIUM: 140 mmol/L (ref 136–145)
TOTAL PROTEIN: 6.7 g/dL (ref 6.4–8.2)

## 2024-01-18 LAB — NT-PROBNP: NT-proBNP: 50 pg/mL (ref 0–125)

## 2024-01-18 LAB — CBC WITH PLATELET
ABSOLUTE NRBC COUNT: 0 TH/uL (ref 0.0–0.0)
HEMATOCRIT: 38.9 % (ref 34.1–44.9)
HEMOGLOBIN: 12 g/dL (ref 11.2–15.7)
MEAN CORP HGB CONC: 30.8 g/dL — ABNORMAL LOW (ref 31.0–37.0)
MEAN CORPUSCULAR HGB: 26.1 pg (ref 26.0–34.0)
MEAN CORPUSCULAR VOL: 84.7 fl (ref 80.0–100.0)
MEAN PLATELET VOLUME: 10.1 fL (ref 8.7–12.5)
NRBC %: 0 % (ref 0.0–0.0)
PLATELET COUNT: 253 TH/uL (ref 150–400)
RBC DISTRIBUTION WIDTH STD DEV: 43.5 fL (ref 35.1–46.3)
RED BLOOD CELL COUNT: 4.59 M/uL (ref 3.90–5.20)
WHITE BLOOD CELL COUNT: 7.1 TH/uL (ref 4.0–11.0)

## 2024-01-18 LAB — VITAMIN D,25 HYDROXY: VITAMIN D,25 HYDROXY: 25 ng/mL — ABNORMAL LOW (ref 30.0–100.0)

## 2024-01-18 MED ORDER — DICLOFENAC SODIUM 1 % EX GEL
4.0000 g | Freq: Four times a day (QID) | CUTANEOUS | 2 refills | Status: DC
Start: 2024-01-18 — End: 2024-02-05

## 2024-01-18 MED ORDER — NAPROXEN 500 MG PO TABS
500.0000 mg | ORAL_TABLET | Freq: Two times a day (BID) | ORAL | 1 refills | Status: DC
Start: 2024-01-18 — End: 2024-02-16

## 2024-01-18 NOTE — Progress Notes (Signed)
 Subjective     Cheryl Dixon is a 68 year old female with COPD, mood disorder, OUD, complicated recent surgery seen for anxiety and pain management    History of Present Illness  Knee instability, falls  Right TKR 02/2023, c/b wound dihiscence and patellar tendon rupture, had open patellar tendon repair 03/2023, but imaging showed failure of the repair, so she underwent a third surgery 04/2023, cast removed 07/2023.  She has not had recent physical therapy since the cast was removed.  She reports worsening knee pain and instability following knee replacement surgery, leading to approximately nine falls at home. Worsening started about a month ago, associated with a fall.  The most recent fall occurred while entering the shower, causing her knee to give out. She describes the knee as 'clicking' and 'snapping out,' necessitating the use of a walker. Previously, she could walk without assistance but has resumed using the walker due to instability.  She reports a bruise on her knee and describes the sensation as feeling like a 'dead tin can.' An x-ray after a fall on September 2nd showed no broken bones.  She uses Tylenol  1000 mg three to four times a day and Celebrex  once daily for pain management, with limited relief.    Obesity  She has made dietary changes, reducing sodium intake and increasing fruits and vegetables, but has gained ten pounds. She has stopped drinking soda and feels depressed due to limited mobility and ongoing pain.    Anxiety  She manages her anxiety disorder with clonazepam  0.5 mg twice daily, which effectively reduces irritability and improves sleep.  There was a big improvement in her anxiety symptoms after she started taking this.    Patient Active Problem List:     Opioid use disorder on agonist therapy (HCC)     Tobacco use disorder     Fibrocystic breast     Essential hypertension     Knee pain     Chronic low back pain     OA (osteoarthritis) of knee     H. pylori infection     Major  depressive disorder, recurrent episode, severe (HCC)     Occult blood in stools     Prediabetes     Epigastric pain     Chronic obstructive pulmonary disease (HCC)     Left foot pain     Multiple polyps of colon     Acquired deformity of toe, right     Dislocation of metatarsophalangeal joint of lesser toe, right, sequela     Postmenopausal atrophic vaginitis     Abnormal loss of weight     Aneurysm of ascending aorta without rupture (HCC)     COVID-19 virus infection     BMI 32.0-32.9,adult     Chronic dysuria     LLQ pain     Osteoporosis     History of endometriosis     Lung cancer screening     Constipation, drug induced     Weakness     Cerebrovascular accident (CVA) (HCC)     Overactive bladder      [facility]) [phone] Option #4     Substance addiction (HCC)     Anxiety     Obese     S/P total knee arthroplasty, right     Patellar tendon rupture, right, initial encounter     Status post total right knee replacement     Rupture patellar tendon, right, subsequent encounter  Patellar tendon rupture, right, sequela     S/P revision of total knee, right     Gait instability     Pain of right lower extremity    clonazePAM  (KLONOPIN ) 0.5 MG tablet, Take 1 tablet by mouth 2 (two) times daily as needed for Anxiety  for up to 14 days, Disp: 14 tablet, Rfl: 1  oxyCODONE  (ROXICODONE ) 5 MG immediate release tablet, Take 1 tablet by mouth every 8 (eight) hours as needed for Pain Max Daily Amount: 15 mg  for up to 14 days, Disp: 42 tablet, Rfl: 0  hydrOXYzine  (ATARAX ) 25 MG tablet, Take 1 tablet by mouth nightly as needed for Itching, Disp: 30 tablet, Rfl: 0  celecoxib  (CELEBREX ) 200 MG capsule, Take 1 capsule by mouth daily, Disp: 30 capsule, Rfl: 0  acetaminophen  (TYLENOL ) 500 MG tablet, Take 2 tablets by mouth every 8 (eight) hours as needed for Pain, Disp: 60 tablet, Rfl: 0  buprenorphine -naloxone  (SUBOXONE ) 8-2 MG sublingual film, Take 1.5 films under the tongue in the morning, 1 film  midday, and 1 film at night, Disp: 98 Film, Rfl: 0  estrogen, conjugated, (PREMARIN ) 0.625 MG/GM vaginal cream, Place 0.5 g vaginally daily, Disp: 30 g, Rfl: 2  hydrocortisone  2.5 % cream, Apply topically 2 (two) times daily To affected areas of scalp and ears, Disp: 56 g, Rfl: 1  polyethylene glycol (GLYCOLAX /MIRALAX ) 17 g packet, Take 1 packet by mouth daily, Disp: 90 packet, Rfl: 3  albuterol  HFA 108 (90 Base) MCG/ACT inhaler, Inhale 2 puffs into the lungs every 6 (six) hours as needed for Wheezing, Disp: , Rfl:   docusate sodium  (COLACE) 100 MG capsule, TAKE 1 CAPSULE BY MOUTH IN THE MORNING AND BEFORE BEDTIME, Disp: 180 capsule, Rfl: 3  tiotropium-olodaterol (STIOLTO RESPIMAT ) 2.5-2.5 MCG/ACT inhal, Inhale 2 puffs into the lungs daily, Disp: , Rfl:   buPROPion  (WELLBUTRIN  SR) 200 MG 12 hr tablet, TAKE 1 TABLET BY MOUTH IN THE MORNING AND BEFORE BEDTIME, Disp: 180 tablet, Rfl: 3  cetirizine  (ZYRTEC ) 10 MG tablet, TAKE 1 TABLET BY MOUTH EVERY DAY IN THE MORNING, Disp: 90 tablet, Rfl: 3  VITAMIN D3 50 MCG (2000 UT) TABS tablet, TAKE 1 TABLET BY MOUTH EVERY DAY IN THE MORNING, Disp: 90 tablet, Rfl: 3  atorvastatin  (LIPITOR) 80 MG tablet, Take 1 tablet by mouth Daily after dinner (Patient not taking: Reported on 05/27/2023), Disp: 90 tablet, Rfl: 3    No current facility-administered medications for this visit.    Review of Patient's Allergies indicates:   Darvon                     Meperidine hcl              Comment:Nausea/vomit   Paxil [paroxetine]      Rash   Zoloft  [sertraline ]     Rash          Objective     BP (!) 170/97   Pulse 76   Temp 97.8 F (36.6 C) (Temporal)   Ht 5' 1 (1.549 m)   Wt 93.4 kg (206 lb)   LMP 11/20/1991   SpO2 96%   BMI 38.92 kg/m   Most Recent Weight Reading(s)  01/18/24 : 93.4 kg (206 lb)  12/29/23 : 88.5 kg (195 lb)  08/25/23 : 88.5 kg (195 lb)    Physical Exam  CHEST: Clear to auscultation bilaterally.  CARDIOVASCULAR: Normal heart sounds.  MUSCULOSKELETAL:  Right knee  well-healed scar, + effusion,  no medial  joint line tenderness, lateral joint line tenderness, no anterior tenderness, suprapatellar tenderness, negative anterior drawer sign, no knee laxity detected     Results  RADIOLOGY  Knee X-ray (12/29/2023):    1. No acute fracture or dislocation of the right knee.    2. Unchanged visualized right total knee arthroplasty. The proximal end of the femoral stem component is incompletely imaged.         Plan   Assessment & Plan  Right knee pain and instability post-total knee arthroplasty   No obvious instability on my exam today, but this is challenging to assess in setting of effusion and pain.  Suspect muscle atrophy is playing a role, given the prolonged immobility of this leg since last November and the lack of PT since the cast was removed.  - Refer to physical therapy for muscle strengthening and stability improvement.  - Continue using walker and knee brace until further evaluation by ortho  - Naproxen  and diclofenac  gel for pain    Left shoulder pain  Worse after steroid injection  Right shoulder pain improved after injection.  Significant shoulder pain post-corticosteroid injection, affecting daily activities.   - naproxen  and dicllofenac gel  - follow up with ortho    Recording duration: 29 minutes    The patient verbally consented to an audio recording of their visit to assist with the completion of documentation. The patient is aware the recording is not retained after the visit is summarized.  Alm Sandman, MD

## 2024-01-19 ENCOUNTER — Ambulatory Visit: Attending: Family Medicine | Admitting: Family Medicine

## 2024-01-19 ENCOUNTER — Ambulatory Visit: Attending: Clinical | Admitting: Clinical

## 2024-01-19 DIAGNOSIS — F3341 Major depressive disorder, recurrent, in partial remission: Secondary | ICD-10-CM | POA: Diagnosis present

## 2024-01-19 DIAGNOSIS — F1120 Opioid dependence, uncomplicated: Secondary | ICD-10-CM | POA: Insufficient documentation

## 2024-01-19 NOTE — Progress Notes (Signed)
 Cheryl Dixon is a 68 year old female seen in follow up for opioid use disorder:    Buprenorphine  dose: 3.5 films a day rx'ed (previously 2.5 films a day - increased iso pain, arthritis, surgeries)  Response, adequacy of dose: as above  Relapses/close calls: No  Meetings: attends every 4+ week  Non-prescribed substance use: None reported    Social history/events:  None    Additional psychiatric or medical care/events:  Working on resuming home PT, low strength in her legs    Present Medications:  naproxen  (NAPROSYN ) 500 MG tablet, Take 1 tablet by mouth in the morning and 1 tablet in the evening. Take with meals., Disp: 60 tablet, Rfl: 1  diclofenac  (VOLTAREN ) 1 % GEL Gel, Apply 4 g topically in the morning and 4 g at noon and 4 g in the evening and 4 g before bedtime., Disp: 100 g, Rfl: 2  clonazePAM  (KLONOPIN ) 0.5 MG tablet, Take 1 tablet by mouth 2 (two) times daily as needed for Anxiety  for up to 14 days, Disp: 14 tablet, Rfl: 1  solifenacin  (VESICARE ) 10 MG tablet, TAKE 1 TABLET BY MOUTH EVERY DAY IN THE MORNING, Disp: 90 tablet, Rfl: 0  buprenorphine -naloxone  (SUBOXONE ) 8-2 MG sublingual film, Take 1.5 films under the tongue in the morning, 1 film midday, and 1 film at night, Disp: 98 Film, Rfl: 0  cetirizine  (ZYRTEC ) 10 MG tablet, TAKE 1 TABLET BY MOUTH EVERY DAY IN THE MORNING, Disp: 90 tablet, Rfl: 3  OTHER MEDICATION, SHOWER CHAIR  For use as directed.    DX: gait instability, OA knee  ICD10:R26.81, M17.9 (Patient not taking: Reported on 10/09/2023), Disp: 1 each, Rfl: 0  calcium  carb-cholecalciferol  500-10 MG-MCG TABS tablet, Take 1 tablet by mouth in the morning and 1 tablet before bedtime., Disp: 180 tablet, Rfl: 3  magnesium  oxide (MAG-OX) 400 (240 Mg) MG tablet, Take 1 tablet by mouth daily, Disp: 90 tablet, Rfl: 3  acetaminophen  (TYLENOL ) 500 MG tablet, Take 1 tablet by mouth every 8 (eight) hours as needed for Pain, Disp: 549 tablet, Rfl: 0  aspirin  (ASPIRIN  81) 81 MG chewable tablet, Take 1 tablet by  mouth in the morning and 1 tablet before bedtime. (Patient not taking: Reported on 10/09/2023), Disp: , Rfl:   estradiol  (ESTRACE ) 0.1 MG/GM vaginal cream, PLACE 5 G VAGINALLY 3 TIMES A WEEK MYLAN BRAND ONLY., Disp: 42.5 g, Rfl: 5  Cholecalciferol  (VITAMIN D3) 50 MCG (2000 UT) TABS, TAKE 1 TABLET BY MOUTH EVERY DAY IN THE MORNING, Disp: 90 tablet, Rfl: 3  atorvastatin  (LIPITOR) 80 MG tablet, Take 1 tablet by mouth daily after dinner, Disp: 90 tablet, Rfl: 3  polyethylene glycol (GLYCOLAX /MIRALAX ) 17 g packet, Take 1 packet by mouth daily (Patient not taking: Reported on 10/09/2023), Disp: 90 packet, Rfl: 3  albuterol  HFA 108 (90 Base) MCG/ACT inhaler, Inhale 2 puffs into the lungs every 6 (six) hours as needed for Wheezing, Disp: , Rfl:   docusate sodium  (COLACE) 100 MG capsule, TAKE 1 CAPSULE BY MOUTH IN THE MORNING AND BEFORE BEDTIME, Disp: 180 capsule, Rfl: 3  tiotropium-olodaterol (STIOLTO RESPIMAT ) 2.5-2.5 MCG/ACT inhal, Inhale 2 puffs into the lungs daily (Patient not taking: Reported on 10/09/2023), Disp: , Rfl:   buPROPion  (WELLBUTRIN  SR) 200 MG 12 hr tablet, TAKE 1 TABLET BY MOUTH IN THE MORNING AND BEFORE BEDTIME, Disp: 180 tablet, Rfl: 3    No current facility-administered medications on file prior to visit.      Last UDS:  LAST UDS  AMPHETAMINES URINE (ng/mL)   Date Value   10/27/2022 NEGATIVE     COCAINE  METABOLITES URINE (ng/mL)   Date Value   10/27/2022 NEGATIVE     OPIATES URINE (ng/mL)   Date Value   10/27/2022 NEGATIVE     BENZODIAZEPINES URINE (ng/mL)   Date Value   10/27/2022 NEGATIVE     CANNABINOIDS URINE (ng/mL)   Date Value   10/27/2022 POSITIVE (A)     ETHANOL URINE (mg/dL)   Date Value   93/92/7976 NEGATIVE       Review of Systems: no notable symptoms    PHYSICAL EXAMINATION: by video  General appearance - healthy female in no distress  Eyes - pupils normal  Skin - warm and dry  Neuro - nonfocal  Affect - normal    Problem List Items Addressed This Visit          Mental Health    Opioid use  disorder on agonist therapy (HCC)           Today in group we discussed problem solving strategies. Cheryl Dixon contributed.    Assessment:  Opioid use disorder  Comment: the patient actively participated and shared experiences, interacting appropriately with group without barriers to understanding  Plan:  Continue suboxone  to 3.5 films daily dose divided TID (1.5 films in AM, 1 film mid day, and 1 film at night).  There are days she takes 3 films a day but others where she takes 3.5 films a day if needed for pain control.  Patient is counseled regarding relapse prevention, involvement in recovery groups, and potential side effects of buprenorphine .    Knee, arm pain - plan to schedule primary care visit, PCP updated    Cheryl Burton, MD, 01/19/2024

## 2024-01-19 NOTE — Progress Notes (Signed)
 OUTPATIENT PSYCHIATRY GROUP PROGRESS NOTE    For the patient participating in the group via telephone and/or video technology: The patient/guardian has verbally consented to participate in this group therapy session by televisit. The patient/guardian was encouraged to be in a private location due to personal health information being discussed and where they could not be overheard by individuals not participating in the group therapy. Although all [place] staff/providers participating in this group therapy televisit are in a private location, all the risks of telephone and/or video technology used to conduct this group therapy session cannot be controlled by [place], (i.e. interruptions, unauthorized access/recording and technical difficulties).  Each patient understands that the visit can be stopped at any time for any reason, including if the conferencing connections are inadequate.    Cheryl Dixon is a 68 year old, Divorced, female, who presented for [facility] group visit.   Length of group: 60 min which included 30 min of Health Behavior Intervention and OUD Psychoeducation.    Group Name: OBOT: Office Based Opioid Treatment Group    Group Topic Today: Problem Solving and Decision Making in Recovery    Group Description: This is a supportive, [facility] team-led recovery group for participants working on maintaining wellness, navigating OUD recovery, and building meaningful lives. Group sessions focus on reflection, connection, and developing practical strategies for personal growth. Members are encouraged to share openly, offer mutual support, and explore themes such as identity, coping skills, relationships, purpose, and resilience. Each week, we introduce a focused discussion topic to guide conversation and foster deeper insight. The group offers a consistent space for members to check in, set intentions, and stay grounded in their recovery goals while learning from one another's experiences. Today, group members shared and  discussed their recovery experience. Group members explored strategies to reduce relapse risks and increase coping mechanisms to support healthy change.      Leaders:  Estefana Burton, MD, Andilynn Delavega, Ph.D., Arnulfo Ohara, RN    Service Type: [phone] Group Psychotherapy     Number of participants in group today:  12        Purpose of Group (choose all that apply):   Skills training/rehab  Support (psychological, family, community resources, recovery)  Insight and behavior change  Increasing health promoting behaviors  Relapse prevention and enhancing Recovery Process  Education about Opioid Use Disorder (OUD) and treatment  Enhancing adjustment and coping with OUD  Improving management of OUD and treatment adherence  Decreasing health risk behaviors and risks of relapse  Addressing psychological/psychosocial/behavioral barriers to preventing addiction and relapse        Group Process:    Review of group goals/structure  Education about Opioid Use Disorder (OUD) and related topics  Group discussion and sharing.    Review of strategies to increase healthy coping mechanisms to support recovery and change.  Review of additional relapse prevention strategies.   Discussion of support strategies  Check in    Individual Patient Participation (select all that apply):  [x]   Attentive to process  [x]   Contributed to group discussion  [x]   Shared personal experience related to the group topic.    Subjective report of pt's progress: Pt participated in group discussion. Pt is still dealing with a leg issue, there was a recent fall, and she just saw Dr. Renda for a medical consult. She is having a hard time scheduling PT - another PT referral was placed by Dr. Renda, and pt was instructed how to make a PT appointment. Otherwise, she  is doing well; getting out a lot, and enjoying seeing family.  Social History update: no major updates reported  Report on relapses and close calls:  No relapse/close calls disclosed.     Medical  Necessity of Session (how treatment is necessary to improve symptoms, functioning, or prevent worsening): treatment is necessary to enhance compliance with agonist treatment and maintain abstinence from opioids. Suboxone  group participation is mandatory to continue Suboxone  prescription and is necessary in order to enhance coping with Opioid Use Disorder.      Relevant Changes in Mental Status: No. No acute issues reported or noted     Risk Level per Scale:  Suicide: re-assessment not indicated today  Violence: re-assessment not indicated today  Addiction: low     Current risk level represents increase in risk: No.     Plan: Pt will continue OBOT treatment      DIAGNOSES:     Opioid dependence on agonist therapy (HCC)   SNOMED CT(R): OPIOID DEPENDENCE, ON AGONIST THERAPY     MDD (major depressive disorder), recurrent, in partial remission (HCC)   SNOMED CT(R): RECURRENT MAJOR DEPRESSION IN PARTIAL REMISSION      REVIEWING TODAY'S VISIT:   Reviewing Today's Visit:  Clinical Interventions Today: Motivational Interviewing; Cognitive Behavioral Therapy (CBT); Relapse Prevention for SUDs  Patient's Response to Interventions: Responsive  Time Spent in Psychotherapy: 60 min            CARE PLAN/ EPISODES:  Linked Episodes   Type: Episode: Status: Noted: Resolved: Last update: Updated by:   PCBHI PCBHI OBOT  Active 09/11/2020  01/19/2024 11:18 AM [name], PhD      Comments:       Patient/Guardian:       Strengths/Skills: Help seeking and motivated to get better     Potential Barriers: Multiple life stressors       Patient stated goals: Pt would like to prevent relapses and support her recovery process     Problem 1: Opioid Dependence on Agonist Therapy (Opioid Use Disorder on Agonist Therapy)      Short Term Goals: Pt will use at least one copying skill a day to reduce a risk of relapse, enhance coping with OUD, and support her recovery process     Short Term Target:  60 days  Short TermTarget Date: 03/05/2024  Short  Term Goal #1 Progress:       Long Term Goals: Pt will report 50% in urges and cravings reduction and no relapses or close calls       Long Term Target:  90 days  Long TermTarget Date:  04/04/2024   Long Term Goal #1 Progress:         Intervention #1: PCBHI-based OBOT/Group Psychotherapy   Intervention Frequency:  biweekly  Intervention Duration:  90 Days  Intervention Responsibility:  J. Miyu Fenderson, PhD and the OBOT team  Mahrosh Donnell, PhD

## 2024-01-20 ENCOUNTER — Ambulatory Visit (HOSPITAL_BASED_OUTPATIENT_CLINIC_OR_DEPARTMENT_OTHER): Payer: Self-pay | Admitting: Internal Medicine

## 2024-01-20 NOTE — Progress Notes (Signed)
 Lab results and comments shared with patient via MyChart.

## 2024-01-22 ENCOUNTER — Encounter (HOSPITAL_BASED_OUTPATIENT_CLINIC_OR_DEPARTMENT_OTHER): Payer: Self-pay | Admitting: Internal Medicine

## 2024-01-22 ENCOUNTER — Other Ambulatory Visit (HOSPITAL_BASED_OUTPATIENT_CLINIC_OR_DEPARTMENT_OTHER): Payer: Self-pay | Admitting: Internal Medicine

## 2024-01-22 DIAGNOSIS — F419 Anxiety disorder, unspecified: Secondary | ICD-10-CM

## 2024-01-22 MED ORDER — CLONAZEPAM 0.5 MG PO TABS
0.5000 mg | ORAL_TABLET | Freq: Two times a day (BID) | ORAL | 1 refills | Status: DC | PRN
Start: 2024-01-22 — End: 2024-02-05

## 2024-01-22 NOTE — Telephone Encounter (Signed)
 PER who is calling: Pharmacy, Taffie Eckmann is a 68 year old female has requested a refill of         clonazePAM  (KLONOPIN ) 0.5 MG tablet          Only complete for controlled medication:    Previously prescribed:  Start Date 01/07/24         End Date 01/21/24             Last Office Visit  : Visit date not found      Last Tele Visit:  01/19/2024      Last Physical Exam:  05/27/2013    There are no preventive care reminders to display for this patient.    Other Med Adult:  Most Recent BP Reading(s)  01/18/24 : (!) 170/97        Cholesterol (mg/dL)   Date Value   95/75/7975 212     LOW DENSITY LIPOPROTEIN DIRECT (mg/dL)   Date Value   95/75/7975 151     HIGH DENSITY LIPOPROTEIN (mg/dL)   Date Value   95/75/7975 47     TRIGLYCERIDES (mg/dL)   Date Value   91/82/7976 119         THYROID  SCREEN TSH REFLEX FT4 (uIU/mL)   Date Value   08/20/2022 4.100         No results found for: TSH    HEMOGLOBIN A1C (%)   Date Value   08/20/2022 5.6       No results found for: POCA1C      INR (no units)   Date Value   12/12/2021 1.1   02/16/2007 1.0 (L)   07/03/2006 < 1.0 (L)       SODIUM (mmol/L)   Date Value   01/18/2024 140       POTASSIUM (mmol/L)   Date Value   01/18/2024 4.0           CREATININE (mg/dL)   Date Value   90/77/7974 0.9       Documented patient preferred pharmacies:    [id number] - [place], [place] - [address]  Phone: [phone] Fax: [phone]

## 2024-01-24 ENCOUNTER — Encounter (HOSPITAL_BASED_OUTPATIENT_CLINIC_OR_DEPARTMENT_OTHER): Payer: Self-pay | Admitting: Physician Assistant

## 2024-01-24 DIAGNOSIS — F419 Anxiety disorder, unspecified: Secondary | ICD-10-CM

## 2024-01-24 NOTE — Telephone Encounter (Signed)
 PER who is calling: Patient (self), Cheryl Dixon is a 68 year old female has requested a refill of       clonazePAM  (KLONOPIN ) 0.5 MG tablet         Only complete for controlled medication:    Previously prescribed:  Start Date 01/22/24         End Date 02/05/24             Last Office Visit  : 01/18/2024 [name], MD      Last Tele Visit:  01/19/2024      Last Physical Exam:  05/27/2013    There are no preventive care reminders to display for this patient.    Other Med Adult:  Most Recent BP Reading(s)  01/18/24 : (!) 170/97        Cholesterol (mg/dL)   Date Value   95/75/7975 212     LOW DENSITY LIPOPROTEIN DIRECT (mg/dL)   Date Value   95/75/7975 151     HIGH DENSITY LIPOPROTEIN (mg/dL)   Date Value   95/75/7975 47     TRIGLYCERIDES (mg/dL)   Date Value   91/82/7976 119         THYROID  SCREEN TSH REFLEX FT4 (uIU/mL)   Date Value   08/20/2022 4.100         No results found for: TSH    HEMOGLOBIN A1C (%)   Date Value   08/20/2022 5.6       No results found for: POCA1C      INR (no units)   Date Value   12/12/2021 1.1   02/16/2007 1.0 (L)   07/03/2006 < 1.0 (L)       SODIUM (mmol/L)   Date Value   01/18/2024 140       POTASSIUM (mmol/L)   Date Value   01/18/2024 4.0           CREATININE (mg/dL)   Date Value   90/77/7974 0.9       Documented patient preferred pharmacies:    [id number] - [place], [place] - [address]  Phone: [phone] Fax: [phone]

## 2024-01-25 ENCOUNTER — Ambulatory Visit: Payer: Self-pay

## 2024-01-25 MED ORDER — CLONAZEPAM 0.5 MG PO TABS
0.5000 mg | ORAL_TABLET | Freq: Two times a day (BID) | ORAL | 1 refills | Status: DC | PRN
Start: 2024-01-25 — End: 2024-03-07

## 2024-01-26 ENCOUNTER — Ambulatory Visit: Attending: Clinical | Admitting: Clinical

## 2024-01-26 ENCOUNTER — Ambulatory Visit (HOSPITAL_BASED_OUTPATIENT_CLINIC_OR_DEPARTMENT_OTHER): Admitting: Clinical

## 2024-01-26 ENCOUNTER — Other Ambulatory Visit (HOSPITAL_BASED_OUTPATIENT_CLINIC_OR_DEPARTMENT_OTHER): Payer: Self-pay | Admitting: Physician Assistant

## 2024-01-26 ENCOUNTER — Ambulatory Visit (HOSPITAL_BASED_OUTPATIENT_CLINIC_OR_DEPARTMENT_OTHER): Admitting: Family Medicine

## 2024-01-26 DIAGNOSIS — F3341 Major depressive disorder, recurrent, in partial remission: Secondary | ICD-10-CM | POA: Insufficient documentation

## 2024-01-26 DIAGNOSIS — Z96651 Presence of right artificial knee joint: Secondary | ICD-10-CM

## 2024-01-26 DIAGNOSIS — F1120 Opioid dependence, uncomplicated: Secondary | ICD-10-CM | POA: Diagnosis present

## 2024-01-26 NOTE — Telephone Encounter (Signed)
 PER who is calling: Patient (self), Cheryl Dixon is a 68 year old female has requested a refill of acetaminophen  .    Last Office Visit: 01/19/24    Other Med Adult:  Most Recent BP Reading(s)  01/18/24 : (!) 170/97        Cholesterol (mg/dL)   Date Value   95/75/7975 212     LOW DENSITY LIPOPROTEIN DIRECT (mg/dL)   Date Value   95/75/7975 151     HIGH DENSITY LIPOPROTEIN (mg/dL)   Date Value   95/75/7975 47     TRIGLYCERIDES (mg/dL)   Date Value   91/82/7976 119         THYROID  SCREEN TSH REFLEX FT4 (uIU/mL)   Date Value   08/20/2022 4.100         No results found for: TSH    HEMOGLOBIN A1C (%)   Date Value   08/20/2022 5.6       No results found for: POCA1C      INR (no units)   Date Value   12/12/2021 1.1   02/16/2007 1.0 (L)   07/03/2006 < 1.0 (L)       SODIUM (mmol/L)   Date Value   01/18/2024 140       POTASSIUM (mmol/L)   Date Value   01/18/2024 4.0           CREATININE (mg/dL)   Date Value   90/77/7974 0.9       Documented patient preferred pharmacies:    [id number] - [place], [place] - [address]  Phone: [phone] Fax: [phone]

## 2024-01-27 ENCOUNTER — Other Ambulatory Visit: Payer: Self-pay

## 2024-01-27 ENCOUNTER — Ambulatory Visit (HOSPITAL_BASED_OUTPATIENT_CLINIC_OR_DEPARTMENT_OTHER): Admitting: Orthopaedic Surgery

## 2024-01-27 ENCOUNTER — Ambulatory Visit
Admission: RE | Admit: 2024-01-27 | Discharge: 2024-01-27 | Disposition: A | Discharge status: Home / Self Care | Attending: Diagnostic Radiology | Admitting: Diagnostic Radiology

## 2024-01-27 DIAGNOSIS — Z96651 Presence of right artificial knee joint: Secondary | ICD-10-CM | POA: Diagnosis not present

## 2024-01-27 DIAGNOSIS — Z043 Encounter for examination and observation following other accident: Secondary | ICD-10-CM | POA: Insufficient documentation

## 2024-01-27 NOTE — Progress Notes (Addendum)
 Orthopedic Office Note    CC: Patient presents with:  Follow Up: R.knee       ORTHOPEDIC PROBLEM LIST:  1. S/p right TKA, DOS: 03/11/2023, Doherty  2. Open right patella tendon rupture (grade 3), DOI: 03/26/2023, s/p poly exchange, irrigation/wound debridement, patellar tendon repair with cerclage cable, DOS: 03/29/2023, Fleeta Lobe  3. S/p revision right TKA to constrained prosthesis, extensor mechanism reconstruction, patellectomy and ROH, DOS: 05/19/23, Lonia    HPI: Cheryl Dixon is a 68 year old female who presents today for postop evaluation for her right knee. She is 9.5 months post op.    To review: Patient has had a complicated postop course regarding her right knee. She was initially a patient of Dr. Johnnette who underwent a primary total knee replacement without complication on 03/11/2023. Unfortunately, she sustained a mechanical fall on 03/26/2023; she was sitting in a chair that was lower than she had expected; felt a slight popping sensation and immediately had pain, bleeding. She was found to have a 2.5 cm inferior wound dehiscence as well as evidence for a patellar tendon rupture. She was placed into a knee immobilizer. She subsequently had a new injury on 03/28/2023 where her right leg gave out while wearing the knee immobilizer causing increased bleeding. She was BIBA to the ED and admitted to the medical service in anticipation of surgical intervention. On exam she was found to have a grade 3 open dehiscence of a right total knee arthroplasty wound and subsequently underwent an open right patellar tendon rupture repair, irrigation and excisional debridement, antibiotic bead placement, extensor tendon tendon reinforcement with cerclage cable, poly exchange and closure. She followed up with Dr. Fleeta Knights office on 04/14/2023 and x-ray/clinical evaluation showed evidence for failure of the patellar tendon repair with patella alta and displacement of the cable. She had an infectious work up with  Dr. Lonia that was negative; she underwent surgery on 05/19/23 for a right knee revision to constrained TKA with extensor mechanism reconstruction. She discharged home the next day after she was put into a long leg cast on the floor with a window for her Wound Vac.  Cast was modified 06/29/2023 and ultimately discontinued on 08/10/23. She was in a bledsoe brace. She follows up today.    She has been briefly lost to follow up. Unfortunately in the interim she has had multiple falls, potentially up to 10 per patient. She states she was in the ER after each fall; ER visits from 6/14, 9/2 were documented in our EMR. Our team was made aware of her falls and made follow up appointments that were subsequently cancelled. She feels the leg is giving out. She utilizes a walker consistently. She is still sore from her most recent fall. She is still struggling with bilateral shoulder pain iso bilateral GH OA which is managed by Dr. Homer. Her daughter has concerns about whether undergoing surgery was the right decision.    She was sent to PT after her last visit; VNA was obtained and during this time she was able to achieve full active motion and could SLR. Our team received a message that she had graduated from [facility]NA and an external referral was placed. Patient states that she was not contacted by our outpatient PT; she did attend 1 session on 11/19/23.     IMAGING: x-ray of the right knee taken today shows a constrained TKA in good alignment. No evidence for hardware failure. Some cement lateral to the proximal tibia presumably from extrusion  out of the hole from previous cerclage cable. No acute fractures or dislocations.    Imaging was reviewed by myself and Dr. Lonia during the visit.    PHYSICAL EXAM:  GENERAL: Alert and oriented, in no acute distress  MOOD: Appropriate.   MUSCULOSKELETAL: evaluation of the right knee reveals no gross bony deformities. Incision at the anterior knee is well healed and well approximated  without evidence for dehiscence. Resolving ecchymosis at the anteromedial aspect of the knee. Mild joint effusion. No additional overlying skin changes including erythema, edema, lacerations, abrasions. Mildly diffusely TTP throughout. No palpable deficits in the extensor mechanism repair. PROM: 0-115. AROM: 10-100. She is able to hold active extension against resistance. No mid flexion instability. No pain with hip rotation. Stable to varus and valgus stress. Thighs and calves soft and NTTP bilaterally. NVI distally.    ASSESSMENT/PLAN: 68 year old female who presents today now 9.5 months postop from a right knee removal of hardware, revision total knee replacement to a constrained prosthesis, extensor mechanism reconstruction, patellectomy on 05/19/2023. She has been lost to follow up; last appointment was 09/07/23. She has unfortunately fallen multiple times in the last few months. Luckily her extensor mechanism reconstruction is still intact and no evidence for fracture. She has an extensor lag today which appears to be 2/2 significant quad weakness. Extensive discussion today regarding the importance of PT. Discussed that if she falls and sustains a periprosthetic fracture she may be looking at amputation. She understands. Plan for urgent PT referral to [facility] since her rides come here. FU in 3 months for XOA right knee.    We reviewed the likely diagnosis, the prognosis, and various treatment options in detail. Risks and benefits of treatment plan discussed. The patient's questions have been answered, and the patient understands agrees with treatment plan.     Dr. Esposito saw and examined the patient and formulated the assessment and plan. Please see his dictated note for additional information.    This note was prepared using voice recognition software. Please disregard any transcription errors.    Lavanda Fillers, PA-C, 01/27/2024      Nursing Communication:    XOA right knee    ORTHOPAEDIC ATTENDING  PHYSICIAN ATTESTATION    I saw and examined this patient with Delila Kuklinski, PA-C.  I performed a complete history and physical exam today and agree with documentation here by PA-C. I spent more than 50% of this joint visit with the patient and did the substantive part of the visit and decision making.    A total of 45 minutes were spent today performing a complete history and physical exam, coordinating patient care, planning for follow up, reviewing history and performing appropriate documentation.      John G Esposito, MD

## 2024-01-27 NOTE — Progress Notes (Signed)
 OUTPATIENT PSYCHIATRY GROUP PROGRESS NOTE    For the patient participating in the group via telephone and/or video technology: The patient/guardian has verbally consented to participate in this group therapy session by televisit. The patient/guardian was encouraged to be in a private location due to personal health information being discussed and where they could not be overheard by individuals not participating in the group therapy. Although all [facility] staff/providers participating in this group therapy televisit are in a private location, all the risks of telephone and/or video technology used to conduct this group therapy session cannot be controlled by [place], (i.e. interruptions, unauthorized access/recording and technical difficulties).  Each patient understands that the visit can be stopped at any time for any reason, including if the conferencing connections are inadequate.    Cheryl Dixon is a 68 year old, Divorced, female, who presented for [facility] group visit.   Length of group: 60 min which included 30 min of Health Behavior Intervention and OUD Psychoeducation.    Group Name: OBOT: Office Based Opioid Treatment Group    Group Topic Today: Financial Wellness in Recovery    Group Description: This is a supportive, PCBHI team-led recovery group for participants working on maintaining wellness, navigating OUD recovery, and building meaningful lives. Group sessions focus on reflection, connection, and developing practical strategies for personal growth. Members are encouraged to share openly, offer mutual support, and explore themes such as identity, coping skills, relationships, purpose, and resilience. Each week, we introduce a focused discussion topic to guide conversation and foster deeper insight. The group offers a consistent space for members to check in, set intentions, and stay grounded in their recovery goals while learning from one another's experiences. Today, group members shared and discussed their  recovery experience. Group members explored strategies to reduce relapse risks and increase coping mechanisms to support healthy change.      Leaders:  Estefana Burton, MD, Landis Dowdy, Ph.D., Arnulfo Ohara, RN    Service Type: [phone] Group Psychotherapy     Number of participants in group today:  12        Purpose of Group (choose all that apply):   Skills training/rehab  Support (psychological, family, community resources, recovery)  Insight and behavior change  Increasing health promoting behaviors  Relapse prevention and enhancing Recovery Process  Education about Opioid Use Disorder (OUD) and treatment  Enhancing adjustment and coping with OUD  Improving management of OUD and treatment adherence  Decreasing health risk behaviors and risks of relapse  Addressing psychological/psychosocial/behavioral barriers to preventing addiction and relapse        Group Process:    Review of group goals/structure  Education about Opioid Use Disorder (OUD) and related topics  Group discussion and sharing.    Review of strategies to increase healthy coping mechanisms to support recovery and change.  Review of additional relapse prevention strategies.   Discussion of support strategies  Check in    Individual Patient Participation (select all that apply):  [x]   Quiet but attentive to process  [x]   Connected late  [x]   Shared personal experience.    Subjective report of pt's progress: Pt reported doing much better with her leg improved. Brother returned home from the hspital.  Social History update: no major updates reported  Report on relapses and close calls:  No relapse/close calls disclosed.     Medical Necessity of Session (how treatment is necessary to improve symptoms, functioning, or prevent worsening): treatment is necessary to enhance compliance with agonist treatment and maintain  abstinence from opioids. Suboxone  group participation is mandatory to continue Suboxone  prescription and is necessary in order to enhance  coping with Opioid Use Disorder.      Relevant Changes in Mental Status: No. No acute issues reported or noted     Risk Level per Scale:  Suicide: re-assessment not indicated today  Violence: re-assessment not indicated today  Addiction: low     Current risk level represents increase in risk: No.     Plan: Pt will continue OBOT treatment     DIAGNOSES:     Opioid dependence on agonist therapy (HCC)  (primary encounter diagnosis)   SNOMED CT(R): OPIOID DEPENDENCE, ON AGONIST THERAPY     MDD (major depressive disorder), recurrent, in partial remission (HCC)   SNOMED CT(R): RECURRENT MAJOR DEPRESSION IN PARTIAL REMISSION      REVIEWING TODAY'S VISIT:   Reviewing Today's Visit:  Clinical Interventions Today: Motivational Interviewing; Cognitive Behavioral Therapy (CBT); Relapse Prevention for SUDs  Patient's Response to Interventions: Responsive  Time Spent in Psychotherapy: 60 min            CARE PLAN/ EPISODES:  Linked Episodes   Type: Episode: Status: Noted: Resolved: Last update: Updated by:   PCBHI PCBHI OBOT  Active 09/11/2020  01/26/2024 12:13 PM Cheryl Bread, PhD      Comments:       Patient/Guardian:       Strengths/Skills: Help seeking and motivated to get better     Potential Barriers: Multiple life stressors       Patient stated goals: Pt would like to prevent relapses and support her recovery process     Problem 1: Opioid Dependence on Agonist Therapy (Opioid Use Disorder on Agonist Therapy)      Short Term Goals: Pt will use at least one copying skill a day to reduce a risk of relapse, enhance coping with OUD, and support her recovery process     Short Term Target:  60 days  Short TermTarget Date: 03/05/2024  Short Term Goal #1 Progress:       Long Term Goals: Pt will report 50% in urges and cravings reduction and no relapses or close calls       Long Term Target:  90 days  Long TermTarget Date:  04/04/2024   Long Term Goal #1 Progress:         Intervention #1: PCBHI-based OBOT/Group Psychotherapy    Intervention Frequency:  biweekly  Intervention Duration:  90 Days  Intervention Responsibility:  J. Darnisha Vernet, PhD and the OBOT team  Jailene Cupit, PhD

## 2024-01-29 ENCOUNTER — Ambulatory Visit: Admitting: Internal Medicine

## 2024-01-29 ENCOUNTER — Encounter (HOSPITAL_BASED_OUTPATIENT_CLINIC_OR_DEPARTMENT_OTHER): Payer: Self-pay | Admitting: Internal Medicine

## 2024-01-29 DIAGNOSIS — Z96651 Presence of right artificial knee joint: Secondary | ICD-10-CM | POA: Diagnosis not present

## 2024-01-29 DIAGNOSIS — F419 Anxiety disorder, unspecified: Secondary | ICD-10-CM

## 2024-01-29 DIAGNOSIS — R531 Weakness: Secondary | ICD-10-CM

## 2024-01-29 DIAGNOSIS — F1120 Opioid dependence, uncomplicated: Secondary | ICD-10-CM

## 2024-01-29 NOTE — Telephone Encounter (Signed)
 Med list partially updated

## 2024-01-29 NOTE — Progress Notes (Signed)
 Subjective     Cheryl Dixon is a 68 year old female with COPD, mood disorder, OUD, complicated recent surgery seen for anxiety and pain management    History of Present Illness  Knee instability, falls  Right TKR 02/2023, c/b wound dihiscence and patellar tendon rupture, had open patellar tendon repair 03/2023, but imaging showed failure of the repair, so she underwent a third surgery 04/2023, cast removed 07/2023.  01/18/24:  seen in our office for several falls, knee giving out.  Has not done any PT since the cast was removed. No instability noted on exam.  01/27/2024:  saw ortho, who felt that her weakness and falls are due to significant quad weakness, and she was advised to engage in PT.  She uses Tylenol  1000 mg three to four times a day and Celebrex  once daily for pain management, with limited relief.    Anxiety  She manages her anxiety disorder with clonazepam  0.5 mg twice daily, which effectively reduces irritability and improves sleep.  There was a big improvement in her anxiety symptoms after she started taking this.    Patient Active Problem List:     Opioid use disorder on agonist therapy (HCC)     Tobacco use disorder     Fibrocystic breast     Essential hypertension     Knee pain     Chronic low back pain     OA (osteoarthritis) of knee     H. pylori infection     Major depressive disorder, recurrent episode, severe (HCC)     Occult blood in stools     Prediabetes     Epigastric pain     Chronic obstructive pulmonary disease (HCC)     Left foot pain     Multiple polyps of colon     Acquired deformity of toe, right     Dislocation of metatarsophalangeal joint of lesser toe, right, sequela     Postmenopausal atrophic vaginitis     Abnormal loss of weight     Aneurysm of ascending aorta without rupture (HCC)     COVID-19 virus infection     BMI 32.0-32.9,adult     Chronic dysuria     LLQ pain     Osteoporosis     History of endometriosis     Lung cancer screening     Constipation, drug induced     Weakness      Cerebrovascular accident (CVA) (HCC)     Overactive bladder      [facility] [phone] Option #4     Substance addiction (HCC)     Anxiety     Obese     S/P total knee arthroplasty, right     Status post total right knee replacement     Patellar tendon rupture, right, sequela     S/P revision of total knee, right     Gait instability     Pain of right lower extremity    clonazePAM  (KLONOPIN ) 0.5 MG tablet, Take 1 tablet by mouth 2 (two) times daily as needed for Anxiety  for up to 14 days, Disp: 14 tablet, Rfl: 1  oxyCODONE  (ROXICODONE ) 5 MG immediate release tablet, Take 1 tablet by mouth every 8 (eight) hours as needed for Pain Max Daily Amount: 15 mg  for up to 14 days, Disp: 42 tablet, Rfl: 0  hydrOXYzine  (ATARAX ) 25 MG tablet, Take 1 tablet by mouth nightly as needed for Itching, Disp: 30 tablet, Rfl: 0  celecoxib  (  CELEBREX ) 200 MG capsule, Take 1 capsule by mouth daily, Disp: 30 capsule, Rfl: 0  acetaminophen  (TYLENOL ) 500 MG tablet, Take 2 tablets by mouth every 8 (eight) hours as needed for Pain, Disp: 60 tablet, Rfl: 0  buprenorphine -naloxone  (SUBOXONE ) 8-2 MG sublingual film, Take 1.5 films under the tongue in the morning, 1 film midday, and 1 film at night, Disp: 98 Film, Rfl: 0  estrogen, conjugated, (PREMARIN ) 0.625 MG/GM vaginal cream, Place 0.5 g vaginally daily, Disp: 30 g, Rfl: 2  hydrocortisone  2.5 % cream, Apply topically 2 (two) times daily To affected areas of scalp and ears, Disp: 56 g, Rfl: 1  polyethylene glycol (GLYCOLAX /MIRALAX ) 17 g packet, Take 1 packet by mouth daily, Disp: 90 packet, Rfl: 3  albuterol  HFA 108 (90 Base) MCG/ACT inhaler, Inhale 2 puffs into the lungs every 6 (six) hours as needed for Wheezing, Disp: , Rfl:   docusate sodium  (COLACE) 100 MG capsule, TAKE 1 CAPSULE BY MOUTH IN THE MORNING AND BEFORE BEDTIME, Disp: 180 capsule, Rfl: 3  tiotropium-olodaterol (STIOLTO RESPIMAT ) 2.5-2.5 MCG/ACT inhal, Inhale 2 puffs into the lungs daily, Disp: , Rfl:    buPROPion  (WELLBUTRIN  SR) 200 MG 12 hr tablet, TAKE 1 TABLET BY MOUTH IN THE MORNING AND BEFORE BEDTIME, Disp: 180 tablet, Rfl: 3  cetirizine  (ZYRTEC ) 10 MG tablet, TAKE 1 TABLET BY MOUTH EVERY DAY IN THE MORNING, Disp: 90 tablet, Rfl: 3  VITAMIN D3 50 MCG (2000 UT) TABS tablet, TAKE 1 TABLET BY MOUTH EVERY DAY IN THE MORNING, Disp: 90 tablet, Rfl: 3  atorvastatin  (LIPITOR) 80 MG tablet, Take 1 tablet by mouth Daily after dinner (Patient not taking: Reported on 05/27/2023), Disp: 90 tablet, Rfl: 3    No current facility-administered medications for this visit.    Review of Patient's Allergies indicates:   Darvon                     Meperidine hcl              Comment:Nausea/vomit   Paxil [paroxetine]      Rash   Zoloft  [sertraline ]     Rash          Objective     LMP 11/20/1991   Most Recent Weight Reading(s)  01/18/24 : 93.4 kg (206 lb)  12/29/23 : 88.5 kg (195 lb)  08/25/23 : 88.5 kg (195 lb)    Physical Exam  CHEST: Clear to auscultation bilaterally.  CARDIOVASCULAR: Normal heart sounds.  MUSCULOSKELETAL:  Right knee well-healed scar, + effusion,  no medial joint line tenderness, lateral joint line tenderness, no anterior tenderness, suprapatellar tenderness, negative anterior drawer sign, no knee laxity detected           Results  RADIOLOGY  Knee X-ray (12/29/2023):    1. No acute fracture or dislocation of the right knee.    2. Unchanged visualized right total knee arthroplasty. The proximal end of the femoral stem component is incompletely imaged.               Plan   Assessment & Plan  Right knee pain and weakness following total knee replacement with postoperative complications  Right knee pain and weakness post-total knee replacement with complications including wound dehiscence and patellar tendon rupture. Quadricep weakness due to lack of physical therapy post-cast removal.  - follow up with physical therapy    Anxiety disorder  Stable on clonazepam   - refill next week.    Opioid use disorder  Stable on  buprenorphine       The patient verbally consented to an audio recording of their visit to assist with the completion of documentation. The patient is aware the recording is not retained after the visit is summarized.  Alm Sandman, MD

## 2024-02-02 ENCOUNTER — Other Ambulatory Visit (HOSPITAL_BASED_OUTPATIENT_CLINIC_OR_DEPARTMENT_OTHER): Payer: Self-pay | Admitting: Family Medicine

## 2024-02-02 ENCOUNTER — Ambulatory Visit: Attending: Family Medicine | Admitting: Family Medicine

## 2024-02-02 ENCOUNTER — Ambulatory Visit: Attending: Clinical | Admitting: Clinical

## 2024-02-02 DIAGNOSIS — F1120 Opioid dependence, uncomplicated: Secondary | ICD-10-CM | POA: Diagnosis not present

## 2024-02-02 DIAGNOSIS — F3341 Major depressive disorder, recurrent, in partial remission: Secondary | ICD-10-CM | POA: Insufficient documentation

## 2024-02-02 MED ORDER — BUPRENORPHINE HCL-NALOXONE HCL 8-2 MG SL FILM
ORAL_FILM | SUBLINGUAL | 0 refills | Status: DC
Start: 2024-02-02 — End: 2024-03-08

## 2024-02-02 NOTE — Progress Notes (Signed)
 Cheryl Dixon is a 68 year old female seen in follow up for opioid use disorder:    Buprenorphine  dose: 3.5 films a day rx'ed (previously 2.5 films a day - increased iso pain, arthritis, surgeries)  Response, adequacy of dose: as above  Relapses/close calls: No  Meetings: attends every 4+ week  Non-prescribed substance use: None reported    Social history/events:  Physical condition is gradually improving, still requires a lot of help from family members    Additional psychiatric or medical care/events:  None    Present Medications:  Acetaminophen  Extra Strength 500 MG TABS, TAKE 1 TABLET BY MOUTH EVERY 8 HOURS AS NEEDED FOR PAIN, Disp: 270 tablet, Rfl: 0  clonazePAM  (KLONOPIN ) 0.5 MG tablet, Take 1 tablet by mouth 2 (two) times daily as needed for Anxiety, Disp: 28 tablet, Rfl: 1  naproxen  (NAPROSYN ) 500 MG tablet, Take 1 tablet by mouth in the morning and 1 tablet in the evening. Take with meals., Disp: 60 tablet, Rfl: 1  diclofenac  (VOLTAREN ) 1 % GEL Gel, Apply 4 g topically in the morning and 4 g at noon and 4 g in the evening and 4 g before bedtime., Disp: 100 g, Rfl: 2  solifenacin  (VESICARE ) 10 MG tablet, TAKE 1 TABLET BY MOUTH EVERY DAY IN THE MORNING, Disp: 90 tablet, Rfl: 0  [DISCONTINUED] buprenorphine -naloxone  (SUBOXONE ) 8-2 MG sublingual film, Take 1.5 films under the tongue in the morning, 1 film midday, and 1 film at night, Disp: 98 Film, Rfl: 0  OTHER MEDICATION, SHOWER CHAIR  For use as directed.    DX: gait instability, OA knee  ICD10:R26.81, M17.9, Disp: 1 each, Rfl: 0  calcium  carb-cholecalciferol  500-10 MG-MCG TABS tablet, Take 1 tablet by mouth in the morning and 1 tablet before bedtime. (Patient not taking: Reported on 01/27/2024), Disp: 180 tablet, Rfl: 3  magnesium  oxide (MAG-OX) 400 (240 Mg) MG tablet, Take 1 tablet by mouth daily, Disp: 90 tablet, Rfl: 3  aspirin  (ASPIRIN  81) 81 MG chewable tablet, Take 1 tablet by mouth in the morning and 1 tablet before bedtime. (Patient not taking:  Reported on 01/27/2024), Disp: , Rfl:   Cholecalciferol  (VITAMIN D3) 50 MCG (2000 UT) TABS, TAKE 1 TABLET BY MOUTH EVERY DAY IN THE MORNING, Disp: 90 tablet, Rfl: 3  atorvastatin  (LIPITOR) 80 MG tablet, Take 1 tablet by mouth daily after dinner (Patient not taking: Reported on 01/27/2024), Disp: 90 tablet, Rfl: 3  polyethylene glycol (GLYCOLAX /MIRALAX ) 17 g packet, Take 1 packet by mouth daily (Patient not taking: Reported on 01/27/2024), Disp: 90 packet, Rfl: 3  albuterol  HFA 108 (90 Base) MCG/ACT inhaler, Inhale 2 puffs into the lungs every 6 (six) hours as needed for Wheezing, Disp: , Rfl:   docusate sodium  (COLACE) 100 MG capsule, TAKE 1 CAPSULE BY MOUTH IN THE MORNING AND BEFORE BEDTIME, Disp: 180 capsule, Rfl: 3  tiotropium-olodaterol (STIOLTO RESPIMAT ) 2.5-2.5 MCG/ACT inhal, Inhale 2 puffs into the lungs daily (Patient not taking: Reported on 01/27/2024), Disp: , Rfl:   buPROPion  (WELLBUTRIN  SR) 200 MG 12 hr tablet, TAKE 1 TABLET BY MOUTH IN THE MORNING AND BEFORE BEDTIME, Disp: 180 tablet, Rfl: 3    No current facility-administered medications on file prior to visit.      Last UDS:  LAST UDS   AMPHETAMINES URINE (ng/mL)   Date Value   10/27/2022 NEGATIVE     COCAINE  METABOLITES URINE (ng/mL)   Date Value   10/27/2022 NEGATIVE     OPIATES URINE (ng/mL)   Date Value  10/27/2022 NEGATIVE     BENZODIAZEPINES URINE (ng/mL)   Date Value   10/27/2022 NEGATIVE     CANNABINOIDS URINE (ng/mL)   Date Value   10/27/2022 POSITIVE (A)     ETHANOL URINE (mg/dL)   Date Value   93/92/7976 NEGATIVE       Review of Systems: no notable symptoms    PHYSICAL EXAMINATION: by video  General appearance - healthy female in no distress  Eyes - pupils normal  Skin - warm and dry  Neuro - nonfocal  Affect - normal    Problem List Items Addressed This Visit          Mental Health    Opioid use disorder on agonist therapy (HCC)         Today in group we discussed helping others and healthy boundaries. [name]  contributed.    Assessment:  Opioid use disorder  Comment: the patient actively participated and shared experiences, interacting appropriately with group without barriers to understanding  Plan:  Continue suboxone  to 3.5 films daily dose divided TID (1.5 films in AM, 1 film mid day, and 1 film at night).  There are days she takes 3 films a day but others where she takes 3.5 films a day if needed for pain control.  Patient is counseled regarding relapse prevention, involvement in recovery groups, and potential side effects of buprenorphine .    Estefana Burton, MD, 02/02/2024

## 2024-02-03 ENCOUNTER — Ambulatory Visit

## 2024-02-03 ENCOUNTER — Other Ambulatory Visit: Payer: Self-pay

## 2024-02-03 ENCOUNTER — Ambulatory Visit: Attending: Physician Assistant | Admitting: Rehabilitative and Restorative Service Providers"

## 2024-02-03 DIAGNOSIS — G8929 Other chronic pain: Secondary | ICD-10-CM | POA: Insufficient documentation

## 2024-02-03 DIAGNOSIS — M25561 Pain in right knee: Secondary | ICD-10-CM | POA: Diagnosis present

## 2024-02-03 DIAGNOSIS — Z96651 Presence of right artificial knee joint: Secondary | ICD-10-CM | POA: Diagnosis present

## 2024-02-03 NOTE — Progress Notes (Signed)
 02/03/24 1000   Precautions   Precautions Orthopedic Precautions   Orthopedic Precautions   Procedure/Injury TKA revision (most recent)   Specific Restrictions do not push flexion   Rehab Discipline   Rehab Discipline PT   Visit   Visit number 2   Recert Due Date  03/04/24   Time Calculation   Start Time 0930   Stop Time 1000   Time Calculation (min) 30 min   Pain   Pain Score 0    Manual Therapy   Manual Therapy No   Ther Exercise   Exercise continue HEP as previous; next- focus on quad strength, NMES   Patient Education   What was taught? review HEP, POC   Method Verbal;Practice   Patient comprehension Yes       Maurine LITTIE Holt, PT, Lic # [id number]

## 2024-02-03 NOTE — Progress Notes (Signed)
 Outpatient Physical Therapy Re-evaluation  [facility]: [facility]    Patient Name: Cheryl Dixon  DOB: 29-Jul-1955  Referring Provider: Redgie Chroman, Fontaine Redo, MD   Date of Onset: 05/19/23  Diagnosis: Chronic pain of right knee  ICD-10: M25.561, G89/29    Subjective: s/p right TKA 03/11/2023; Open right patella tendon rupture 03/26/2023, s/p poly exchange, irrigation/wound debridement, patellar tendon repair with cerclage cable 03/29/2023, and  most recently s/p revision right TKA to constrained prosthesis, extensor mechanism reconstruction, patellectomy and [facility] 05/19/23  Pain:0/10 currently, 7/10 at worst. Better with rest/most of the time; worse with full extension. Pain is described as a pinch and is located anterolateral and anteromedial right knee. (+) buckling  Paraesthesia? denies   ADL's: recent fall with tub transfer, does have shower chair and grab bars. Typically independent but not easy. Modified/difficulty getting dressed. Doing all household chores with difficulty.   Driving: N/A  Stairs: No stairs at home  Work/School Duties: Retired  Sleeping: WNL  Recreational Activities: likes to walk on a treadmill, hasn't been able to do it safely.    Contraindications/Precautions: HTN, prediabetes    PMH:   Patient Active Problem List:     Opioid use disorder on agonist therapy (HCC)     Tobacco use disorder     Fibrocystic breast     Essential hypertension     Knee pain     Chronic low back pain     OA (osteoarthritis) of knee     H. pylori infection     Major depressive disorder, recurrent episode, severe (HCC)     Occult blood in stools     Prediabetes     Epigastric pain     Chronic obstructive pulmonary disease (HCC)     Left foot pain     Multiple polyps of colon     Acquired deformity of toe, right     Dislocation of metatarsophalangeal joint of lesser toe, right, sequela     Postmenopausal atrophic vaginitis     Abnormal loss of weight     Aneurysm of ascending aorta  without rupture (HCC)     COVID-19 virus infection     BMI 32.0-32.9,adult     Chronic dysuria     LLQ pain     Osteoporosis     History of endometriosis     Lung cancer screening     Constipation, drug induced     Weakness     Cerebrovascular accident (CVA) (HCC)     Overactive bladder      [facility] [phone] Option #4     Substance addiction (HCC)     Anxiety     Obese     S/P total knee arthroplasty, right     Status post total right knee replacement     Patellar tendon rupture, right, sequela     S/P revision of total knee, right     Gait instability     Pain of right lower extremity    Past Medical History:  No date: Anxiety  No date: Arthritis  No date: COPD (chronic obstructive pulmonary disease) (HCC)  No date: Depression  No date: Obese  No date: Stroke [facility])  No date: Substance addiction (HCC)  No date: Varicose veins of lower extremities    Medications:  buprenorphine -naloxone  (SUBOXONE ) 8-2 MG sublingual film, Take 1.5 films under the tongue in the morning, 1 film midday, and 1 film at night, Disp: 98 Film, Rfl:  0  Acetaminophen  Extra Strength 500 MG TABS, TAKE 1 TABLET BY MOUTH EVERY 8 HOURS AS NEEDED FOR PAIN, Disp: 270 tablet, Rfl: 0  clonazePAM  (KLONOPIN ) 0.5 MG tablet, Take 1 tablet by mouth 2 (two) times daily as needed for Anxiety, Disp: 28 tablet, Rfl: 1  naproxen  (NAPROSYN ) 500 MG tablet, Take 1 tablet by mouth in the morning and 1 tablet in the evening. Take with meals., Disp: 60 tablet, Rfl: 1  diclofenac  (VOLTAREN ) 1 % GEL Gel, Apply 4 g topically in the morning and 4 g at noon and 4 g in the evening and 4 g before bedtime., Disp: 100 g, Rfl: 2  solifenacin  (VESICARE ) 10 MG tablet, TAKE 1 TABLET BY MOUTH EVERY DAY IN THE MORNING, Disp: 90 tablet, Rfl: 0  OTHER MEDICATION, SHOWER CHAIR  For use as directed.    DX: gait instability, OA knee  ICD10:R26.81, M17.9, Disp: 1 each, Rfl: 0  calcium  carb-cholecalciferol  500-10 MG-MCG TABS tablet, Take 1 tablet by mouth in  the morning and 1 tablet before bedtime. (Patient not taking: Reported on 01/27/2024), Disp: 180 tablet, Rfl: 3  magnesium  oxide (MAG-OX) 400 (240 Mg) MG tablet, Take 1 tablet by mouth daily, Disp: 90 tablet, Rfl: 3  aspirin  (ASPIRIN  81) 81 MG chewable tablet, Take 1 tablet by mouth in the morning and 1 tablet before bedtime. (Patient not taking: Reported on 01/27/2024), Disp: , Rfl:   Cholecalciferol  (VITAMIN D3) 50 MCG (2000 UT) TABS, TAKE 1 TABLET BY MOUTH EVERY DAY IN THE MORNING, Disp: 90 tablet, Rfl: 3  atorvastatin  (LIPITOR) 80 MG tablet, Take 1 tablet by mouth daily after dinner (Patient not taking: Reported on 01/27/2024), Disp: 90 tablet, Rfl: 3  polyethylene glycol (GLYCOLAX /MIRALAX ) 17 g packet, Take 1 packet by mouth daily (Patient not taking: Reported on 01/27/2024), Disp: 90 packet, Rfl: 3  albuterol  HFA 108 (90 Base) MCG/ACT inhaler, Inhale 2 puffs into the lungs every 6 (six) hours as needed for Wheezing, Disp: , Rfl:   docusate sodium  (COLACE) 100 MG capsule, TAKE 1 CAPSULE BY MOUTH IN THE MORNING AND BEFORE BEDTIME, Disp: 180 capsule, Rfl: 3  tiotropium-olodaterol (STIOLTO RESPIMAT ) 2.5-2.5 MCG/ACT inhal, Inhale 2 puffs into the lungs daily (Patient not taking: Reported on 01/27/2024), Disp: , Rfl:   buPROPion  (WELLBUTRIN  SR) 200 MG 12 hr tablet, TAKE 1 TABLET BY MOUTH IN THE MORNING AND BEFORE BEDTIME, Disp: 180 tablet, Rfl: 3    No current facility-administered medications on file prior to visit.      Objective:   Please Note: Only populated fields were assessed by provider, fields left blank were not assessed. * denotes pain with testing.    Posture: WNL  Balance: poor static SLS R/fair static SLS L  Gait: ambulates with antalgic gait with RW, one instance of buckling walking into clinic   MMT   RIGHT LEFT   Plantarflexion NT/5 NT/5   Quad 2+/5 4/5   Hamstrings 3+/5 4/5   Ant tib NT/5 NT/5   Glut med  NT/5 NT/5   Glut max  NT/5 NT/5   A/PROM  RLE: 0-120  LLE: 10-125  Muscle Length  RLE: NT  LLE:  NT  Palpation:  TTP: min-mod R quad  Tissue Density: WNL  Skin Integrity: healed incision  Joint Mobility: N/A       Plan of Care    Referring Provider: Lavanda Fillers, Fontaine Redo, MD  Diagnosis: Chronic pain of right knee.    Assessment: Raelan Burgoon is a 68 year old  year old female who presents to [place]: [facility] outpatient physical therapy after a few months away from PT for re-evaluation s/p right TKA 03/11/2023; Open right patella tendon rupture 03/26/2023, s/p poly exchange, irrigation/wound debridement, patellar tendon repair with cerclage cable 03/29/2023, and  most recently s/p revision right TKA to constrained prosthesis, extensor mechanism reconstruction, patellectomy and [facility] 05/19/23    Clinical presentation today is consistent with significant quad weakness post complicated surgical history and pt will benefit from skilled PT focused on strengthening to address the following problems and impairments noted upon evaluation: pain, weakness, resultant gait and balance impairments. These problems limit the patient with the following functional activities: all ADL's, household chores, stair negotiation, ambulation in the community, falls at home. The prescribed treatment plan of care is medically necessary to address afore mentioned impairments and return to maximal level of function.    GOALS: new goals developed today  Short Term Functional Goals: 4 weeks.   Patient will demonstrate improved strength to 4/5 throughout BLE's  Patient will be independent with initial HEP    Long Term Goal: 8 weeks.   Patient will demonstrate improved strength to 5/5 throughout so that she will be able to complete all ADL's and household chores with minimal pain  Patient will demonstrate improved knee stability so that she will be able to ascend/descend stairs with step-to pattern, with minimal pain  Patient will be independent with HEP      Treatment Plan:  ** PT Re-Eval (CPT 97164)  ** Stretching/ROM/Therapeutic Exercise  (97110)  ** Self-Care/Home Management Training (CPT [phone])  ** Neuromuscular Re-education (CPT [id number])  ** Manual Therapy / Joint / Soft tissue Mobilization (CPT 97140)  ** Electrical Stimulation/TENS (CPT 97014)  ** Hot/Cold Rx (CPT 97010)  ** Gait Training (CPT [id number])  ** Functional Activities (CPT 97530)    Recommend skilled physical therapy services for 1 time per week for 8 weeks. Updated plan of care will be completed every 30 days.    Patient goals reviewed and incorporated in plan of care: Yes  Plan of care discussed with Patient/Family: Yes  Patient/Family agrees with plan of care: Yes  Patient/Family education provided: Yes  Patient/Family is aware of attendance policy: Yes  Patient feels safe at home: Yes    Thank you for your referral. Please feel free to contact me with any questions or concerns.    Maurine LITTIE Holt, PT, Lic # [id number]

## 2024-02-03 NOTE — Progress Notes (Signed)
 I certify that the documented Treatment Plan is reasonable and necessary.    02/03/2024  Velna Hedgecock, PA-C

## 2024-02-04 ENCOUNTER — Ambulatory Visit: Admitting: Orthopaedic Surgery

## 2024-02-04 NOTE — Progress Notes (Signed)
 OUTPATIENT PSYCHIATRY GROUP PROGRESS NOTE    For the patient participating in the group via telephone and/or video technology: The patient/guardian has verbally consented to participate in this group therapy session by televisit. The patient/guardian was encouraged to be in a private location due to personal health information being discussed and where they could not be overheard by individuals not participating in the group therapy. Although all [facility] staff/providers participating in this group therapy televisit are in a private location, all the risks of telephone and/or video technology used to conduct this group therapy session cannot be controlled by [place], (i.e. interruptions, unauthorized access/recording and technical difficulties).  Each patient understands that the visit can be stopped at any time for any reason, including if the conferencing connections are inadequate.    Cheryl Dixon is a 68 year old, Divorced, female, who presented for [facility] group visit.   Length of group: 60 min which included 30 min of Health Behavior Intervention and OUD Psychoeducation.    Group Name: OBOT: Office Based Opioid Treatment Group    Group Topic Today:  Helping Others Without Overextending Yourself.     Group Description: This is a supportive, PCBHI team-led recovery group for participants working on maintaining wellness, navigating OUD recovery, and building meaningful lives. Group sessions focus on reflection, connection, and developing practical strategies for personal growth. Members are encouraged to share openly, offer mutual support, and explore themes such as identity, coping skills, relationships, purpose, and resilience. Each week, we introduce a focused discussion topic to guide conversation and foster deeper insight. The group offers a consistent space for members to check in, set intentions, and stay grounded in their recovery goals while learning from one another's experiences. Today, group members shared and  discussed their recovery experience. Group members explored strategies to reduce relapse risks and increase coping mechanisms to support healthy change.      Leaders:  Estefana Burton, MD, Grabiela Wohlford, Ph.D., Arnulfo Ohara, RN    Service Type: [phone] Group Psychotherapy     Number of participants in group today:  12        Purpose of Group (choose all that apply):   Skills training/rehab  Support (psychological, family, community resources, recovery)  Insight and behavior change  Increasing health promoting behaviors  Relapse prevention and enhancing Recovery Process  Education about Opioid Use Disorder (OUD) and treatment  Enhancing adjustment and coping with OUD  Improving management of OUD and treatment adherence  Decreasing health risk behaviors and risks of relapse  Addressing psychological/psychosocial/behavioral barriers to preventing addiction and relapse        Group Process:    Review of group goals/structure  Education about Opioid Use Disorder (OUD) and related topics  Group discussion and sharing.    Review of strategies to increase healthy coping mechanisms to support recovery and change.  Review of additional relapse prevention strategies.   Discussion of support strategies  Check in    Individual Patient Participation (select all that apply):  [x]   Attentive to process  [x]   Contributed to group discussion  [x]   Shared personal experience related to the group topic.    Subjective report of pt's progress: Pt participated in group discussion. Reports one year of receiving assistance from others due to disability. Experiencing shoulder issues and currently attending physical therapy. Reports modest improvements in self-care and expresses gratitude for children.  Social History update: no major updates reported  Report on relapses and close calls:  No relapse/close calls disclosed.  Medical Necessity of Session (how treatment is necessary to improve symptoms, functioning, or prevent worsening):  treatment is necessary to enhance compliance with agonist treatment and maintain abstinence from opioids. Suboxone  group participation is mandatory to continue Suboxone  prescription and is necessary in order to enhance coping with Opioid Use Disorder.      Relevant Changes in Mental Status: No. No acute issues reported or noted     Risk Level per Scale:  Suicide: re-assessment not indicated today  Violence: re-assessment not indicated today  Addiction: low     Current risk level represents increase in risk: No.     Plan: Pt will continue OBOT treatment      DIAGNOSES:     Opioid dependence on agonist therapy (HCC)  (primary encounter diagnosis)   SNOMED CT(R): OPIOID DEPENDENCE, ON AGONIST THERAPY     MDD (major depressive disorder), recurrent, in partial remission (HCC)   SNOMED CT(R): RECURRENT MAJOR DEPRESSION IN PARTIAL REMISSION      REVIEWING TODAY'S VISIT:   Reviewing Today's Visit:  Clinical Interventions Today: Motivational Interviewing; Cognitive Behavioral Therapy (CBT); Relapse Prevention for SUDs  Patient's Response to Interventions: Responsive  Time Spent in Psychotherapy: 60 min            CARE PLAN/ EPISODES:  Linked Episodes   Type: Episode: Status: Noted: Resolved: Last update: Updated by:   PCBHI PCBHI OBOT  Active 09/11/2020  02/02/2024 12:26 PM [name], PhD      Comments:       Patient/Guardian:       Strengths/Skills: Help seeking and motivated to get better     Potential Barriers: Multiple life stressors       Patient stated goals: Pt would like to prevent relapses and support her recovery process     Problem 1: Opioid Dependence on Agonist Therapy (Opioid Use Disorder on Agonist Therapy)      Short Term Goals: Pt will use at least one copying skill a day to reduce a risk of relapse, enhance coping with OUD, and support her recovery process     Short Term Target:  60 days  Short TermTarget Date: 03/05/2024  Short Term Goal #1 Progress:       Long Term Goals: Pt will report 50% in urges  and cravings reduction and no relapses or close calls       Long Term Target:  90 days  Long TermTarget Date:  04/04/2024   Long Term Goal #1 Progress:         Intervention #1: PCBHI-based OBOT/Group Psychotherapy   Intervention Frequency:  biweekly  Intervention Duration:  90 Days  Intervention Responsibility:  J. Jeniyah Menor, PhD and the OBOT team  Tyquan Carmickle, PhD

## 2024-02-05 ENCOUNTER — Other Ambulatory Visit (HOSPITAL_BASED_OUTPATIENT_CLINIC_OR_DEPARTMENT_OTHER): Payer: Self-pay | Admitting: Internal Medicine

## 2024-02-05 MED ORDER — DICLOFENAC SODIUM 1 % EX GEL
4.0000 g | Freq: Four times a day (QID) | CUTANEOUS | 2 refills | Status: DC
Start: 2024-02-05 — End: 2024-02-18

## 2024-02-05 NOTE — Telephone Encounter (Signed)
 PER who is calling: Patient (self), Caleb Prigmore is a 68 year old female has requested a refill of         diclofenac  (VOLTAREN ) 1 % GEL  .      Last Office Visit  : 01/18/2024 [name], MD      Last Tele Visit:  02/02/2024      Last Physical Exam:  05/27/2013    There are no preventive care reminders to display for this patient.    Other Med Adult:  Most Recent BP Reading(s)  01/18/24 : (!) 170/97        Cholesterol (mg/dL)   Date Value   95/75/7975 212     LOW DENSITY LIPOPROTEIN DIRECT (mg/dL)   Date Value   95/75/7975 151     HIGH DENSITY LIPOPROTEIN (mg/dL)   Date Value   95/75/7975 47     TRIGLYCERIDES (mg/dL)   Date Value   91/82/7976 119         THYROID  SCREEN TSH REFLEX FT4 (uIU/mL)   Date Value   08/20/2022 4.100         No results found for: TSH    HEMOGLOBIN A1C (%)   Date Value   08/20/2022 5.6       No results found for: POCA1C      INR (no units)   Date Value   12/12/2021 1.1   02/16/2007 1.0 (L)   07/03/2006 < 1.0 (L)       SODIUM (mmol/L)   Date Value   01/18/2024 140       POTASSIUM (mmol/L)   Date Value   01/18/2024 4.0           CREATININE (mg/dL)   Date Value   90/77/7974 0.9       Documented patient preferred pharmacies:    [id number] - [place] [employer] [place], [place] - [address]  Phone: [phone] Fax: [phone]

## 2024-02-06 ENCOUNTER — Other Ambulatory Visit: Payer: Self-pay

## 2024-02-06 ENCOUNTER — Emergency Department (HOSPITAL_BASED_OUTPATIENT_CLINIC_OR_DEPARTMENT_OTHER)

## 2024-02-06 ENCOUNTER — Emergency Department
Admission: EM | Admit: 2024-02-06 | Discharge: 2024-02-06 | Disposition: A | Discharge status: Home / Self Care | Attending: Emergency Medicine | Admitting: Emergency Medicine

## 2024-02-06 DIAGNOSIS — E041 Nontoxic single thyroid nodule: Secondary | ICD-10-CM | POA: Diagnosis not present

## 2024-02-06 DIAGNOSIS — S0181XA Laceration without foreign body of other part of head, initial encounter: Secondary | ICD-10-CM | POA: Diagnosis present

## 2024-02-06 DIAGNOSIS — Y929 Unspecified place or not applicable: Secondary | ICD-10-CM | POA: Insufficient documentation

## 2024-02-06 DIAGNOSIS — W1809XA Striking against other object with subsequent fall, initial encounter: Secondary | ICD-10-CM | POA: Diagnosis not present

## 2024-02-06 DIAGNOSIS — Y9289 Other specified places as the place of occurrence of the external cause: Secondary | ICD-10-CM | POA: Diagnosis not present

## 2024-02-06 DIAGNOSIS — Y9389 Activity, other specified: Secondary | ICD-10-CM | POA: Diagnosis not present

## 2024-02-06 DIAGNOSIS — M542 Cervicalgia: Secondary | ICD-10-CM | POA: Diagnosis not present

## 2024-02-06 DIAGNOSIS — W19XXXA Unspecified fall, initial encounter: Secondary | ICD-10-CM | POA: Diagnosis not present

## 2024-02-06 DIAGNOSIS — S0990XA Unspecified injury of head, initial encounter: Secondary | ICD-10-CM

## 2024-02-06 MED ORDER — OCTYL CYANOACRYLATE TISSUE ADHESIVE
1.0000 | Freq: Once | TOPICAL | Status: AC
Start: 2024-02-06 — End: 2024-02-06
  Administered 2024-02-06: 1 via TOPICAL
  Filled 2024-02-06: qty 1

## 2024-02-06 MED ORDER — ACETAMINOPHEN 500 MG PO TABS
1000.0000 mg | ORAL_TABLET | Freq: Once | ORAL | Status: AC
Start: 2024-02-06 — End: 2024-02-06
  Administered 2024-02-06: 1000 mg via ORAL
  Filled 2024-02-06: qty 2

## 2024-02-06 MED ORDER — METHOCARBAMOL 500 MG PO TABS
750.0000 mg | ORAL_TABLET | Freq: Once | ORAL | Status: AC
Start: 2024-02-06 — End: 2024-02-06
  Administered 2024-02-06: 750 mg via ORAL
  Filled 2024-02-06: qty 2

## 2024-02-06 NOTE — Narrator Note (Signed)
 C-collar found off; pt states she removed it herself.

## 2024-02-06 NOTE — ED Provider Notes (Signed)
 EMERGENCY DEPARTMENT  NOTE          History of Present Illness:    This 68 year old female patient presents with fall, head injury.  Patient was using her walker and states that her knee gave out, she fell forward and due to her chronic frozen shoulder pain was unable to hold herself and fell forwards hitting her head.  Sustained a laceration to the left eyebrow.  Denies loss of consciousness.  Having pain from the back of her head and neck down towards her upper back.  She denies any other injuries.  Last tetanus 2013        Physical Exam:  GENERAL:  68 year old female in no apparent distress.    VITAL SIGNS:  BP (!) 174/104   Pulse 58   Temp 98.4 F   Resp 16   LMP 11/20/1991   SpO2 96%     HEENT: 1 cm laceration just above the left eyebrow with some associated soft tissue swelling.  No periorbital tenderness or step-offs PERRL.  Extraocular muscles intact.  Neck is supple.  There is some slight tenderness at C1/C2  CHEST:  Lungs are clear to auscultation bilaterally.  No wheeze or crackles.  Chest wall nontender  CARDIAC:  RRR, S1/S2 present, no m/r/g     BACK:  No midline ttp, no flank ttp  ABDOMEN:  Soft, nontender, non distended, no obvious organomegaly. BS present  EXTREMITIES:  Warm and well perfused. Pulses are 2+ bilateral.  No edema.    SKIN:  Warm and dry.  No rash.    NEUROLOGIC: Cranial nerves grossly intact, no focal motor/sensory deficits.  Alert and appropriate.         ED Course and Medical Decision Making: 68 year old female with mechanical trip and fall, assisted head strike, has a laceration above her left eyebrow that is amenable to closure with tissue adhesive, tetanus up-to-date.  Patient has pain to the back of the head and neck, CT cervical spine and head obtained.    Laceration repair note: Laceration was cleaned with water and gauze, no visible foreign body or debris.  1 cm horizontal laceration through the dermis was washed with water and gauze.  Wound is clean, was covered with  several layers of tissue adhesive with good result.    CT head and cervical spine obtained, patient given Tylenol  and Robaxin  for pain relief.    Patient signed out to ED provider at change of shift with imaging results pending.      Relevant Comorbidities: Patellar tendon rupture right, total knee arthroplasty, substance addiction, opioid use disorder on agonist therapy, osteoporosis, CVA        Orders:   Orders Placed This Encounter      CT Head WO Contrast          Standing Status: Standing          Number of Occurrences: 1          Critical Signs and Symptoms: fall, headstrike          To assure imaging is appropriate for clinical symptoms or due to patient restrictions, this order may be changed to reflect patient needs.: Yes          Release result to patient via [facility]: Immediate [1]      CT Cervical Spine WO Contrast          Standing Status: Standing          Number of Occurrences:  1          Critical Signs and Symptoms: fall, neck pain          To assure imaging is appropriate for clinical symptoms or due to patient restrictions, this order may be changed to reflect patient needs.: Yes          Release result to patient via My[facility]: Immediate [1]      octyl cyanoacrylate (OCTYLSEAL) tissue adhesive 1 each          Results:  No results found for this visit on 02/06/24 (from the past 24 hours).          Critical Care Time: 0 minutes      Condition: stable  DIAGNOSIS:  Fall, initial encounter  Injury of head, initial encounter  Laceration of forehead, initial encounter              Marolyn Row, MD, 02/06/2024

## 2024-02-06 NOTE — ED Triage Note (Addendum)
 Presents to ED via ems from home c/o unwitnessed mechanical fall at home. Pt reports landing on her knee. States that was unable to break her fall. + head strike. Reports L knee pain, bilateral shoulder/arm pain. Denies Loc. No thinner. PMH arthritis, Knee replacement surgery. C-collar placed by ems

## 2024-02-06 NOTE — ED Notes (Signed)
 This patient was signed out to me at change of shift by my colleague.    Please see ED course below for updates on clinical course.     ED Course as of 02/06/24 1523   Sat Feb 06, 2024   1505 3:06 PM  This is a 68 year old female who presents with mechanical fall while using walker.  +headstrike. Laceration repaired with Dermabond  [ ]  CT head and neck   [ ]  ambulate      1517 CT head and neck without acute traumatic injury.  Incidental thyroid  nodule   1521 Updated patient at bedside.  Was able to ambulate with her walker.  Aware of thyroid  nodule.  Comfortable plan for discharge home       No acute events this shift.       Diagnosis:  Fall, initial encounter  Injury of head, initial encounter  Laceration of forehead, initial encounter  Thyroid  nodule  Disposition:  Discharged to home in stable condition      Rosaline LITTIE Majestic, MD

## 2024-02-06 NOTE — Narrator Note (Signed)
 Assisted pt to bathroom with steady gait with walker.

## 2024-02-06 NOTE — Discharge Instructions (Addendum)
 Your CT scan of your head and cervical spine did not show any acute abnormalities.  There was an incidental finding of a thyroid  nodule, please follow up with your PCP for this.    Continue Tylenol  and naproxen  for pain relief, ice your area of bruising to reduce pain and swelling    Follow-up with your primary care doctor regarding ongoing symptoms, return to the emergency department for any severe/worsening symptoms.

## 2024-02-06 NOTE — Narrator Note (Signed)
 Patient Disposition  Patient education for diagnosis, medications, activity, diet and follow-up.  Patient left ED 3:33 PM.  Patient rep received written instructions.    Interpreter to provide instructions: No    Patient belongings with patient: YES    Have all existing LDAs been addressed? N/A    Have all IV infusions been stopped? N/A    Destination: Discharged to home.  Verbalized understanding of the discharge instructions. Agreeable with the plan of care. Steady gait with walker.

## 2024-02-07 ENCOUNTER — Other Ambulatory Visit (HOSPITAL_BASED_OUTPATIENT_CLINIC_OR_DEPARTMENT_OTHER): Payer: Self-pay | Admitting: Physician Assistant

## 2024-02-07 ENCOUNTER — Other Ambulatory Visit (HOSPITAL_BASED_OUTPATIENT_CLINIC_OR_DEPARTMENT_OTHER): Payer: Self-pay | Admitting: Internal Medicine

## 2024-02-07 DIAGNOSIS — Z96651 Presence of right artificial knee joint: Secondary | ICD-10-CM

## 2024-02-07 DIAGNOSIS — N3281 Overactive bladder: Secondary | ICD-10-CM

## 2024-02-07 DIAGNOSIS — J449 Chronic obstructive pulmonary disease, unspecified: Secondary | ICD-10-CM

## 2024-02-07 NOTE — Progress Notes (Signed)
 Orthopedic Office Note    CC: Patient presents with:  Follow Up: R.knee       ORTHOPEDIC PROBLEM LIST:  1. S/p right TKA, DOS: 03/11/2023, Doherty  2. Open right patella tendon rupture (grade 3), DOI: 03/26/2023, s/p poly exchange, irrigation/wound debridement, patellar tendon repair with cerclage cable, DOS: 03/29/2023, Fleeta Lobe  3. S/p revision right TKA to constrained prosthesis, extensor mechanism reconstruction, patellectomy and ROH, DOS: 05/19/23, Lonia    HPI: Cheryl Dixon is a 68 year old female who presents today for postop evaluation for her right knee. She is 9.5 months post op.    To review: Patient has had a complicated postop course regarding her right knee. She was initially a patient of Dr. Johnnette who underwent a primary total knee replacement without complication on 03/11/2023. Unfortunately, she sustained a mechanical fall on 03/26/2023; she was sitting in a chair that was lower than she had expected; felt a slight popping sensation and immediately had pain, bleeding. She was found to have a 2.5 cm inferior wound dehiscence as well as evidence for a patellar tendon rupture. She was placed into a knee immobilizer. She subsequently had a new injury on 03/28/2023 where her right leg gave out while wearing the knee immobilizer causing increased bleeding. She was BIBA to the ED and admitted to the medical service in anticipation of surgical intervention. On exam she was found to have a grade 3 open dehiscence of a right total knee arthroplasty wound and subsequently underwent an open right patellar tendon rupture repair, irrigation and excisional debridement, antibiotic bead placement, extensor tendon tendon reinforcement with cerclage cable, poly exchange and closure. She followed up with Dr. Fleeta Knights office on 04/14/2023 and x-ray/clinical evaluation showed evidence for failure of the patellar tendon repair with patella alta and displacement of the cable. She had an infectious work up with  Dr. Lonia that was negative; she underwent surgery on 05/19/23 for a right knee revision to constrained TKA with extensor mechanism reconstruction. She discharged home the next day after she was put into a long leg cast on the floor with a window for her Wound Vac.  Cast was modified 06/29/2023 and ultimately discontinued on 08/10/23. She was in a bledsoe brace. She follows up today.    She has been briefly lost to follow up. Unfortunately in the interim she has had multiple falls, potentially up to 10 per patient. She states she was in the ER after each fall; ER visits from 6/14, 9/2 were documented in our EMR. Our team was made aware of her falls and made follow up appointments that were subsequently cancelled. She feels the leg is giving out. She utilizes a walker consistently. She is still sore from her most recent fall. She is still struggling with bilateral shoulder pain iso bilateral GH OA which is managed by Dr. Homer. Her daughter has concerns about whether undergoing surgery was the right decision.    She was sent to PT after her last visit; VNA was obtained and during this time she was able to achieve full active motion and could SLR. Our team received a message that she had graduated from VNA and an external referral was placed. Patient states that she was not contacted by our outpatient PT; she did attend 1 session on 11/19/23.     IMAGING: x-ray of the right knee taken today shows a constrained TKA in good alignment. No evidence for hardware failure. Some cement lateral to the proximal tibia presumably from extrusion  out of the hole from previous cerclage cable. No acute fractures or dislocations.    Imaging was reviewed by myself and Dr. Lonia during the visit.    PHYSICAL EXAM:  GENERAL: Alert and oriented, in no acute distress  MOOD: Appropriate.   MUSCULOSKELETAL: evaluation of the right knee reveals no gross bony deformities. Incision at the anterior knee is well healed and well approximated  without evidence for dehiscence. Resolving ecchymosis at the anteromedial aspect of the knee. Mild joint effusion. No additional overlying skin changes including erythema, edema, lacerations, abrasions. Mildly diffusely TTP throughout. No palpable deficits in the extensor mechanism repair. PROM: 0-115. AROM: 10-100. She is able to hold active extension against resistance. No mid flexion instability. No pain with hip rotation. Stable to varus and valgus stress. Thighs and calves soft and NTTP bilaterally. NVI distally.    ASSESSMENT/PLAN: 68 year old female who presents today now 9.5 months postop from a right knee removal of hardware, revision total knee replacement to a constrained prosthesis, extensor mechanism reconstruction, patellectomy on 05/19/2023. She has been lost to follow up; last appointment was 09/07/23. She has unfortunately fallen multiple times in the last few months. Luckily her extensor mechanism reconstruction is still intact and no evidence for fracture. She has an extensor lag today which appears to be 2/2 significant quad weakness. Extensive discussion today regarding the importance of PT. Discussed that if she falls and sustains a periprosthetic fracture she may be looking at amputation. She understands. Plan for urgent PT referral to [facility] since her rides come here. FU in 3 months for XOA right knee.    We reviewed the likely diagnosis, the prognosis, and various treatment options in detail. Risks and benefits of treatment plan discussed. The patient's questions have been answered, and the patient understands agrees with treatment plan.     Dr. Winter Jocelyn saw and examined the patient and formulated the assessment and plan. Please see his dictated note for additional information.    This note was prepared using voice recognition software. Please disregard any transcription errors.    Lavanda Fillers, PA-C, 02/07/2024      Nursing Communication:    XOA right knee    ORTHOPAEDIC ATTENDING  PHYSICIAN ATTESTATION    I saw and examined this patient with Abigail Leahey, PA-C.  I performed a complete history and physical exam today and agree with documentation here by PA-C. I spent more than 50% of this joint visit with the patient and did the substantive part of the visit and decision making.    A total of 45 minutes were spent today performing a complete history and physical exam, coordinating patient care, planning for follow up, reviewing history and performing appropriate documentation.      Benisha Hadaway G Aser Nylund, MD

## 2024-02-08 ENCOUNTER — Encounter (HOSPITAL_BASED_OUTPATIENT_CLINIC_OR_DEPARTMENT_OTHER): Payer: Self-pay | Admitting: Registered Nurse

## 2024-02-08 NOTE — Telephone Encounter (Signed)
 PER who is calling: Pharmacy, Cheryl Dixon is a 68 year old female has requested a refill of       Wellbutrin  and stiolto respimat .             Last Office Visit  : 01/18/2024 [name], MD      Last Tele Visit:  02/02/2024      Last Physical Exam:  05/27/2013    There are no preventive care reminders to display for this patient.    Other Med Adult:  Most Recent BP Reading(s)  02/06/24 : 163/100        Cholesterol (mg/dL)   Date Value   95/75/7975 212     LOW DENSITY LIPOPROTEIN DIRECT (mg/dL)   Date Value   95/75/7975 151     HIGH DENSITY LIPOPROTEIN (mg/dL)   Date Value   95/75/7975 47     TRIGLYCERIDES (mg/dL)   Date Value   91/82/7976 119         THYROID  SCREEN TSH REFLEX FT4 (uIU/mL)   Date Value   08/20/2022 4.100         No results found for: TSH    HEMOGLOBIN A1C (%)   Date Value   08/20/2022 5.6       No results found for: POCA1C      INR (no units)   Date Value   12/12/2021 1.1   02/16/2007 1.0 (L)   07/03/2006 < 1.0 (L)       SODIUM (mmol/L)   Date Value   01/18/2024 140       POTASSIUM (mmol/L)   Date Value   01/18/2024 4.0           CREATININE (mg/dL)   Date Value   90/77/7974 0.9       Documented patient preferred pharmacies:    [id number] - [place], [place] - [address]  Phone: [phone] Fax: [phone]

## 2024-02-08 NOTE — Telephone Encounter (Signed)
 Cheryl Dixon is a 68 year old female who was seen in ED following a fall.   Patient notes that her knee is giving her a lot of issues. SHe has another black eye.   Patient is already connected with physical therapy. Patient would like to see PCP, appointment scheduled.

## 2024-02-09 ENCOUNTER — Ambulatory Visit (HOSPITAL_BASED_OUTPATIENT_CLINIC_OR_DEPARTMENT_OTHER): Payer: Self-pay | Admitting: Registered Nurse

## 2024-02-09 NOTE — Telephone Encounter (Addendum)
 Cheryl Messing, PA-C to Me  Roll, Alm, MD  (Selected Message)      02/09/24  3:14 PM  I think we should book her for sooner televisit with us  and will be a good idea to see Ortho as well if we can get her in right away    Per PA-C tc to pt for sooner televisit  Agrees to Monday 02/15/24 w/PA-C  Will monitor if any sooner cancellations and will call pt if one becomes available.  Message to Ortho RN's to see if can have pt seen sooner as well.  Pt agrees with plan.

## 2024-02-09 NOTE — Telephone Encounter (Signed)
 Answer Assessment - Initial Assessment Questions  Camie Miss, RN, 02/09/2024  TC from pt to NAL  RN confirmed pt's name, DOB and demographics  Pt reports has ED f/u with PCP for next week  Calling as is s/p another fall last night  Fell on her back side  Has been taking Naprosyn  twice daily and using pain gel every 20 mins  Rates her pain as severe  Hx of frozen shoulders and hard to push herself up from sitting position  Using brace and tennis shoes in the house as directed  Has PT next Monday to start  Has been told to rest leg, then to exercise leg, not sure what she is supposed to do  Reports right leg can not straighten   Worried there is a tendon torn  Last ED visit on 02/06/24 post fall  Last Ortho visit 01/27/24, DNKA 02/04/24 for f/u, next ortho booked for 03/30/24  Wants PCP to revisit medication  Has ED f/u tele with him next week but wants interim plan    Protocols used: Medication Question Call-A-OH

## 2024-02-10 ENCOUNTER — Telehealth (HOSPITAL_BASED_OUTPATIENT_CLINIC_OR_DEPARTMENT_OTHER): Payer: Self-pay | Admitting: Registered Nurse

## 2024-02-10 ENCOUNTER — Telehealth (HOSPITAL_BASED_OUTPATIENT_CLINIC_OR_DEPARTMENT_OTHER): Payer: Self-pay | Admitting: Licensed Practical Nurse

## 2024-02-10 NOTE — Telephone Encounter (Signed)
 Message sent to [facility] PA and Dr Lonia  Please see messages below,  pt had a fall, PCP requesting pt be seen by Dr Lonia    Can we book her for tomorrow ?                       Furtado, Sara, RN to [employer] Rn Triage  (Selected Message)  [facility]      02/09/24  4:08 PM  ATTN NEEDED:  Hello lovely nurses. This pt is usually seen at [facility] and next visit is 03/30/24 w/Dr Lonia but PCP team wants her seen sooner. Pls see message as below and my note in Epic for details.  Pt can be seen  in [place] as well (not [place] per her request).  TY all  Camie PEAK at NAL    Jolaine Messing, PA-C to Me  [name], MD  (Selected Message)        02/09/24  3:14 PM  I think we should book her for sooner televisit with  us  and will be a good idea to see Ortho as well if we can get her in right away  Furtado, Sara, RN  [facility]    02/09/24  4:05 PM  Note      Jolaine Messing, PA-C to Me  Roll, Lenis, MD  (Selected Message)      02/09/24  3:14 PM  I think we should book her for sooner televisit with us  and will be a good idea to see Ortho as well if we can get her in right away     Per PA-C tc to pt for sooner televisit  Agrees to Monday 02/15/24 w/PA-C  Will monitor if any sooner cancellations and will call pt if one becomes available.  Message to Ortho RN's to see if can have pt seen sooner as well.  Pt agrees with plan.        Furtado, Sara, RN to Jolaine Messing, PA-C   [facility]      02/09/24  3:54 PM  Arleigh-  Absolutely.  Will get this taken care of.    Benjaman Camie at NAL  Jolaine Messing, PA-C to Furtado, Sara, RN  [name], MD         02/09/24  3:14 PM  I think we should book her for sooner televisit with us  and will be a good idea to see Ortho as well if we can get her in right away  Meta Camie, RN to [name], MD  Jolaine Messing, PA-C   [facility]      02/09/24  9:51 AM  ATTN NEEDED:  This pt called NAL today after speaking with clinic nurse yest that she is s/p another fall last night. Had a fall on 10/11 and went to  ER. Long ortho hx (s/p right TKA, followed by tendon rupture with repair  etc etc). Has had multiple falls since. Med regimen is Naprosyn  500mg  twice daily, Diclofenac  gel (prescribed for use 4 times a day, but she's been using it as much as 2-3 times per hour). Does have ED f/u televisit with Dr  Renda next week, but wants interim plan. Shall I book a tele with team/refer to Ortho (last appt was 10/1 w/Dr Lonia). Kindly advise and I will advise pt of plan. TY both! Camie for NAL  Furtado, Sara, RN  [facility]    02/09/24  9:42 AM  Note      Answer Assessment -  Initial Assessment Questions  Camie Miss, RN, 02/09/2024  TC from pt to NAL  RN confirmed pt's name, DOB and demographics  Pt reports has ED f/u with PCP for next week  Calling as is s/p another fall last night  Fell on her back side  Has been taking Naprosyn  twice daily and using pain gel every 20 mins  Rates her pain as severe  Hx of frozen shoulders and hard to push herself up from sitting position  Using brace and tennis shoes in the house as directed  Has PT next Monday to start  Has been told to rest leg, then to exercise leg, not sure what she is supposed to do  Reports right leg can not straighten   Worried there is a tendon torn  Last ED visit on 02/06/24 post fall  Last Ortho visit 01/27/24, DNKA 02/04/24 for f/u, next ortho booked for 03/30/24  Wants PCP to revisit medication  Has ED f/u tele with him next week but wants interim plan    Protocols used: Medication Question Call-A-OH

## 2024-02-10 NOTE — Telephone Encounter (Signed)
-----   Message from Debby ORN sent at 02/10/2024 11:36 AM EDT -----  Regarding: [facility] pt PT  Contact: [phone]  Harlene pt daughter calling for pt. Looking to set up Home PT services for pt. Pt has fallen 4 or 5 times since July. Impossible to get pt to PT unless we carry her in. PT assuming pt lost all her quad muscle in the thigh. Pt last seen 01/27/24 for Status post revision of total replacement of right knee.  Harlene says my question is, what are we doing to really help pt right now?    Please call and advise pt

## 2024-02-15 ENCOUNTER — Ambulatory Visit: Admitting: Physician Assistant

## 2024-02-15 ENCOUNTER — Other Ambulatory Visit (HOSPITAL_BASED_OUTPATIENT_CLINIC_OR_DEPARTMENT_OTHER): Payer: Self-pay | Admitting: Internal Medicine

## 2024-02-15 ENCOUNTER — Ambulatory Visit (HOSPITAL_BASED_OUTPATIENT_CLINIC_OR_DEPARTMENT_OTHER): Admitting: Rehabilitative and Restorative Service Providers"

## 2024-02-15 DIAGNOSIS — G8929 Other chronic pain: Secondary | ICD-10-CM | POA: Diagnosis not present

## 2024-02-15 DIAGNOSIS — E041 Nontoxic single thyroid nodule: Secondary | ICD-10-CM

## 2024-02-15 DIAGNOSIS — M25512 Pain in left shoulder: Secondary | ICD-10-CM | POA: Diagnosis not present

## 2024-02-15 DIAGNOSIS — Z96651 Presence of right artificial knee joint: Secondary | ICD-10-CM

## 2024-02-15 DIAGNOSIS — G245 Blepharospasm: Secondary | ICD-10-CM

## 2024-02-15 DIAGNOSIS — M25511 Pain in right shoulder: Secondary | ICD-10-CM | POA: Diagnosis not present

## 2024-02-15 NOTE — Telephone Encounter (Signed)
 PER who is calling: Pharmacy, Attallah Ontko is a 68 year old female has requested a refill of       naproxen  (NAPROSYN ) 500 MG tablet           Last Tele Visit:  02/15/2024      Last Physical Exam:  05/27/2013    There are no preventive care reminders to display for this patient.    Other Med Adult:  Most Recent BP Reading(s)  02/06/24 : 163/100        Cholesterol (mg/dL)   Date Value   95/75/7975 212     LOW DENSITY LIPOPROTEIN DIRECT (mg/dL)   Date Value   95/75/7975 151     HIGH DENSITY LIPOPROTEIN (mg/dL)   Date Value   95/75/7975 47     TRIGLYCERIDES (mg/dL)   Date Value   91/82/7976 119         THYROID  SCREEN TSH REFLEX FT4 (uIU/mL)   Date Value   08/20/2022 4.100         No results found for: TSH    HEMOGLOBIN A1C (%)   Date Value   08/20/2022 5.6       No results found for: POCA1C      INR (no units)   Date Value   12/12/2021 1.1   02/16/2007 1.0 (L)   07/03/2006 < 1.0 (L)       SODIUM (mmol/L)   Date Value   01/18/2024 140       POTASSIUM (mmol/L)   Date Value   01/18/2024 4.0           CREATININE (mg/dL)   Date Value   90/77/7974 0.9       Documented patient preferred pharmacies:    [id number] - [place], [place] - [address]  Phone: [phone] Fax: [phone]

## 2024-02-15 NOTE — Progress Notes (Signed)
 Cheryl Dixon is a 68 year old female pt of Dr. [name], MD who presents for follow-up      Fall  Fall x 2 at home  10/11 fell on knees  Seen in the ED, had unremarkable CT head and C-spine  Reports falling again yesterday  Slid off the couch and fell on her butt  Worried because she cannot fully extend right knee  Had right knee replacement complicated by patellar tendon rupture earlier this year and worried there may be additional injury with her falls  However reports she has been unable to fully extend her knee since having surgery  Last Ortho visit was 3 weeks ago and was also noted to have inability to fully extend at that time  Was felt to be due to quad weakness and recommended PT  Currently engaged in PT      Shoulder pain  Shoulders painful  Had bilateral shoulder injections several months ago  Right side improved but left is still painful  Difficult to use her walker due to pain  Taking celebrex  and naproxen  with some improvement      Eye twitch  Left eye  Going on for some time but now seems worse  Feels like left side of her face is also twitching      Thyroid  nodule  Noted incidentally on recent CT C-spine      Patient Active Problem List:     Opioid use disorder on agonist therapy (HCC)     Tobacco use disorder     Fibrocystic breast     Essential hypertension     Knee pain     Chronic low back pain     OA (osteoarthritis) of knee     H. pylori infection     Major depressive disorder, recurrent episode, severe (HCC)     Occult blood in stools     Prediabetes     Epigastric pain     Chronic obstructive pulmonary disease (HCC)     Left foot pain     Multiple polyps of colon     Acquired deformity of toe, right     Dislocation of metatarsophalangeal joint of lesser toe, right, sequela     Postmenopausal atrophic vaginitis     Abnormal loss of weight     Aneurysm of ascending aorta without rupture (HCC)     COVID-19 virus infection     BMI 32.0-32.9,adult     Chronic dysuria     LLQ pain      Osteoporosis     History of endometriosis     Lung cancer screening     Constipation, drug induced     Weakness     Cerebrovascular accident (CVA) (HCC)     Overactive bladder      [facility] [phone] Option #4     Substance addiction (HCC)     Anxiety     Obese     S/P total knee arthroplasty, right     Status post total right knee replacement     Patellar tendon rupture, right, sequela     S/P revision of total knee, right     Gait instability     Pain of right lower extremity      buPROPion  (WELLBUTRIN  SR) 200 MG 12 hr tablet, TAKE 1 TABLET BY MOUTH IN THE MORNING AND BEFORE BEDTIME, Disp: 180 tablet, Rfl: 3  solifenacin  (VESICARE ) 10 MG tablet, TAKE 1 TABLET BY MOUTH EVERY DAY  IN THE MORNING, Disp: 90 tablet, Rfl: 3  STIOLTO RESPIMAT  2.5-2.5 MCG/ACT inhal, INHALE 2 PUFFS INTO THE LUNGS IN THE MORNING. (Patient not taking: Reported on 02/18/2024), Disp: 4 g, Rfl: 11  buprenorphine -naloxone  (SUBOXONE ) 8-2 MG sublingual film, Take 1.5 films under the tongue in the morning, 1 film midday, and 1 film at night, Disp: 98 Film, Rfl: 0  Acetaminophen  Extra Strength 500 MG TABS, TAKE 1 TABLET BY MOUTH EVERY 8 HOURS AS NEEDED FOR PAIN, Disp: 270 tablet, Rfl: 0  clonazePAM  (KLONOPIN ) 0.5 MG tablet, Take 1 tablet by mouth 2 (two) times daily as needed for Anxiety, Disp: 28 tablet, Rfl: 1  OTHER MEDICATION, SHOWER CHAIR  For use as directed.    DX: gait instability, OA knee  ICD10:R26.81, M17.9, Disp: 1 each, Rfl: 0  calcium  carb-cholecalciferol  500-10 MG-MCG TABS tablet, Take 1 tablet by mouth in the morning and 1 tablet before bedtime. (Patient not taking: Reported on 01/27/2024), Disp: 180 tablet, Rfl: 3  magnesium  oxide (MAG-OX) 400 (240 Mg) MG tablet, Take 1 tablet by mouth daily, Disp: 90 tablet, Rfl: 3  Cholecalciferol  (VITAMIN D3) 50 MCG (2000 UT) TABS, TAKE 1 TABLET BY MOUTH EVERY DAY IN THE MORNING, Disp: 90 tablet, Rfl: 3  polyethylene glycol (GLYCOLAX /MIRALAX ) 17 g packet, Take 1 packet  by mouth daily, Disp: 90 packet, Rfl: 3  albuterol  HFA 108 (90 Base) MCG/ACT inhaler, Inhale 2 puffs into the lungs every 6 (six) hours as needed for Wheezing, Disp: , Rfl:   docusate sodium  (COLACE) 100 MG capsule, TAKE 1 CAPSULE BY MOUTH IN THE MORNING AND BEFORE BEDTIME, Disp: 180 capsule, Rfl: 3    No current facility-administered medications on file prior to visit.      Review of Patient's Allergies indicates:   Darvon                     Meperidine hcl              Comment:Nausea/vomit   Paxil [paroxetine]      Rash   Zoloft  [sertraline ]     Rash      Exam  General-NAD  Pulmonary-speaking full sentences        ASSESSMENT/PLAN        (S03.348) S/P total knee arthroplasty, right  (primary encounter diagnosis)  (S03.348) S/P revision of total knee, right  Comment: Had 2 falls at home in about the last week.  In the first fall she slipped and landed on her knees.  Was seen in the ED, unremarkable imaging of head and C-spine.  Reports she fell again recently, slid off the couch and landed on her butt.  Worried about her right knee which was surgically repaired earlier this year.  States she is unable to fully extend it but this was noted on her most recent Ortho visit and attributed to quadricep weakness.  Discussed physical therapy is the best treatment for this and she is currently attending PT  Plan: Continue PT            Follow-up with orthopedics this week as planned             Discussed measures to prevent falls including consistent walker use and getting help from friends and family when needed      (M25.511,  G89.29,  M25.512) Chronic pain of both shoulders  Comment: OA in both shoulders, had injections with improvement on the right side but continued pain on the left.  Taking naproxen  and  Celebrex  along with Tylenol .  Worried because she has limited range of motion of the left side due to the pain.  Discussed that given current knee issues we need physical therapy to focus on this, but could address left  shoulder pain once PT for knee is completed  Plan: Advised to continue Celebrex  which she reports was helpful in the past, discontinue naproxen  to avoid excess NSAIDs           Continue Tylenol  as well           Consider therapy for shoulder once therapy for knee is completed          (E04.1) Thyroid  nodule  Comment: Noted incidentally on CT C-spine, will order ultrasound  Plan: US  THYROID       (G24.5) Eye twitch  Comment: Reports ongoing twitching of the left eye that seems to be worsening  Plan: Schedule in person visit to assess

## 2024-02-18 ENCOUNTER — Ambulatory Visit (HOSPITAL_BASED_OUTPATIENT_CLINIC_OR_DEPARTMENT_OTHER): Admitting: Orthopaedic Surgery

## 2024-02-18 ENCOUNTER — Encounter (HOSPITAL_BASED_OUTPATIENT_CLINIC_OR_DEPARTMENT_OTHER): Payer: Self-pay | Admitting: Internal Medicine

## 2024-02-18 ENCOUNTER — Ambulatory Visit: Attending: Internal Medicine | Admitting: Internal Medicine

## 2024-02-18 ENCOUNTER — Other Ambulatory Visit: Payer: Self-pay

## 2024-02-18 ENCOUNTER — Ambulatory Visit: Admission: RE | Admit: 2024-02-18 | Discharge: 2024-02-18 | Disposition: A | Discharge status: Home / Self Care

## 2024-02-18 DIAGNOSIS — F419 Anxiety disorder, unspecified: Secondary | ICD-10-CM | POA: Insufficient documentation

## 2024-02-18 DIAGNOSIS — G8929 Other chronic pain: Secondary | ICD-10-CM | POA: Diagnosis present

## 2024-02-18 DIAGNOSIS — M25561 Pain in right knee: Secondary | ICD-10-CM | POA: Diagnosis present

## 2024-02-18 DIAGNOSIS — M25519 Pain in unspecified shoulder: Secondary | ICD-10-CM | POA: Insufficient documentation

## 2024-02-18 DIAGNOSIS — Z96651 Presence of right artificial knee joint: Secondary | ICD-10-CM | POA: Insufficient documentation

## 2024-02-18 DIAGNOSIS — Z471 Aftercare following joint replacement surgery: Secondary | ICD-10-CM | POA: Insufficient documentation

## 2024-02-18 DIAGNOSIS — S161XXD Strain of muscle, fascia and tendon at neck level, subsequent encounter: Secondary | ICD-10-CM | POA: Diagnosis present

## 2024-02-18 DIAGNOSIS — I6330 Cerebral infarction due to thrombosis of unspecified cerebral artery: Secondary | ICD-10-CM | POA: Insufficient documentation

## 2024-02-18 DIAGNOSIS — F1120 Opioid dependence, uncomplicated: Secondary | ICD-10-CM | POA: Diagnosis present

## 2024-02-18 DIAGNOSIS — M1711 Unilateral primary osteoarthritis, right knee: Secondary | ICD-10-CM | POA: Diagnosis present

## 2024-02-18 MED ORDER — DICLOFENAC SODIUM 1 % EX GEL
4.0000 g | Freq: Four times a day (QID) | CUTANEOUS | 2 refills | Status: DC
Start: 2024-02-18 — End: 2024-02-25

## 2024-02-18 NOTE — Progress Notes (Signed)
 Subjective     Cheryl Dixon is a 68 year old female with COPD, mood disorder, OUD, complicated recent surgery seen for anxiety and pain management    History of Present Illness  Knee instability, falls  Right TKR 02/2023, c/b wound dihiscence and patellar tendon rupture, had open patellar tendon repair 03/2023, but imaging showed failure of the repair, so she underwent a third surgery 04/2023, cast removed 07/2023.  01/18/24:  seen in our office for several falls, knee giving out.  Has not done any PT since the cast was removed. No instability noted on exam.  01/27/2024:  saw ortho, who felt that her weakness and falls are due to significant quad weakness, and she was advised to engage in PT.  She uses Tylenol  1000 mg three to four times a day and Celebrex  once daily for pain management, with limited relief.  02/06/24:  seen in ED for yet another fall, with head injury.    02/17/24:  saw ortho again, x-rays showed no damage to repair, planning to start PT.  Still having some pain in the neck and shoulders since her last fall.  Has trouble reaching plates and mashing potatoes.  Most recent fall was in the bathroom, planning to get walk-in shower.    Pulsation in face  = twitching of facial muscles.  Notices it when she is tired, feels anxious or angry.    MHT  Has tapered off estrogen.    History of stroke  Stopped ASA and atorvastatin  and is trying to watch her diet very carefully.    Anxiety  She manages her anxiety disorder with clonazepam  0.5 mg twice daily, which effectively reduces irritability and improves sleep.  There was a big improvement in her anxiety symptoms after she started taking this.      Patient Active Problem List:     Opioid use disorder on agonist therapy (HCC)     Tobacco use disorder     Fibrocystic breast     Essential hypertension     Knee pain     Chronic low back pain     OA (osteoarthritis) of knee     H. pylori infection     Major depressive disorder, recurrent episode, severe (HCC)      Occult blood in stools     Prediabetes     Epigastric pain     Chronic obstructive pulmonary disease (HCC)     Left foot pain     Multiple polyps of colon     Acquired deformity of toe, right     Dislocation of metatarsophalangeal joint of lesser toe, right, sequela     Postmenopausal atrophic vaginitis     Abnormal loss of weight     Aneurysm of ascending aorta without rupture (HCC)     COVID-19 virus infection     BMI 32.0-32.9,adult     Chronic dysuria     LLQ pain     Osteoporosis     History of endometriosis     Lung cancer screening     Constipation, drug induced     Weakness     Cerebrovascular accident (CVA) (HCC)     Overactive bladder      [facility]) [phone] Option #4     Substance addiction (HCC)     Anxiety     Obese     S/P total knee arthroplasty, right     Status post total right knee replacement     Patellar tendon  rupture, right, sequela     S/P revision of total knee, right     Gait instability     Pain of right lower extremity    clonazePAM  (KLONOPIN ) 0.5 MG tablet, Take 1 tablet by mouth 2 (two) times daily as needed for Anxiety  for up to 14 days, Disp: 14 tablet, Rfl: 1  oxyCODONE  (ROXICODONE ) 5 MG immediate release tablet, Take 1 tablet by mouth every 8 (eight) hours as needed for Pain Max Daily Amount: 15 mg  for up to 14 days, Disp: 42 tablet, Rfl: 0  hydrOXYzine  (ATARAX ) 25 MG tablet, Take 1 tablet by mouth nightly as needed for Itching, Disp: 30 tablet, Rfl: 0  celecoxib  (CELEBREX ) 200 MG capsule, Take 1 capsule by mouth daily, Disp: 30 capsule, Rfl: 0  acetaminophen  (TYLENOL ) 500 MG tablet, Take 2 tablets by mouth every 8 (eight) hours as needed for Pain, Disp: 60 tablet, Rfl: 0  buprenorphine -naloxone  (SUBOXONE ) 8-2 MG sublingual film, Take 1.5 films under the tongue in the morning, 1 film midday, and 1 film at night, Disp: 98 Film, Rfl: 0  estrogen, conjugated, (PREMARIN ) 0.625 MG/GM vaginal cream, Place 0.5 g vaginally daily, Disp: 30 g, Rfl:  2  hydrocortisone  2.5 % cream, Apply topically 2 (two) times daily To affected areas of scalp and ears, Disp: 56 g, Rfl: 1  polyethylene glycol (GLYCOLAX /MIRALAX ) 17 g packet, Take 1 packet by mouth daily, Disp: 90 packet, Rfl: 3  albuterol  HFA 108 (90 Base) MCG/ACT inhaler, Inhale 2 puffs into the lungs every 6 (six) hours as needed for Wheezing, Disp: , Rfl:   docusate sodium  (COLACE) 100 MG capsule, TAKE 1 CAPSULE BY MOUTH IN THE MORNING AND BEFORE BEDTIME, Disp: 180 capsule, Rfl: 3  tiotropium-olodaterol (STIOLTO RESPIMAT ) 2.5-2.5 MCG/ACT inhal, Inhale 2 puffs into the lungs daily, Disp: , Rfl:   buPROPion  (WELLBUTRIN  SR) 200 MG 12 hr tablet, TAKE 1 TABLET BY MOUTH IN THE MORNING AND BEFORE BEDTIME, Disp: 180 tablet, Rfl: 3  cetirizine  (ZYRTEC ) 10 MG tablet, TAKE 1 TABLET BY MOUTH EVERY DAY IN THE MORNING, Disp: 90 tablet, Rfl: 3  VITAMIN D3 50 MCG (2000 UT) TABS tablet, TAKE 1 TABLET BY MOUTH EVERY DAY IN THE MORNING, Disp: 90 tablet, Rfl: 3  atorvastatin  (LIPITOR) 80 MG tablet, Take 1 tablet by mouth Daily after dinner (Patient not taking: Reported on 05/27/2023), Disp: 90 tablet, Rfl: 3    No current facility-administered medications for this visit.    Review of Patient's Allergies indicates:   Darvon                     Meperidine hcl              Comment:Nausea/vomit   Paxil [paroxetine]      Rash   Zoloft  [sertraline ]     Rash          Objective     LMP 11/20/1991   Most Recent Weight Reading(s)  01/18/24 : 93.4 kg (206 lb)  12/29/23 : 88.5 kg (195 lb)  08/25/23 : 88.5 kg (195 lb)    Physical Exam  No physical exam done on this virtual visit   Patient was unable to connect to video link  On telephone, patient is alert and in no distress  Speech is clear  Speaking in full sentences  Thought content is normal      Results  RADIOLOGY  Knee X-ray (12/29/2023):    1. No acute fracture or dislocation of the  right knee.    2. Unchanged visualized right total knee arthroplasty. The proximal end of the femoral stem  component is incompletely imaged.               Plan   Assessment & Plan  Right let pain and weakness following total knee replacement with postoperative complications  Saw ortho and plans to follow up with PT  - follow up with physical therapy    Neck and shoulder pain  Additional referral placed in case PT wants to work on these things as well, although knee rehab is the priority.    Twitching of facial muscles  Mostly around the eye  This is typically benign in the absence of other neurologic signs nad symptoms  - reassess at next in-person appointment    Anxiety disorder  Stable on clonazepam   - refill next week.    Opioid use disorder  Stable on buprenorphine     History of stroke  Strongly encouraged to restart ASA and statin  Even if you are eating well, these medications significantly reduce your risk of another stroke.      The patient verbally consented to an audio recording of their visit to assist with the completion of documentation. The patient is aware the recording is not retained after the visit is summarized.  Alm Sandman, MD

## 2024-02-20 ENCOUNTER — Encounter (HOSPITAL_BASED_OUTPATIENT_CLINIC_OR_DEPARTMENT_OTHER): Payer: Self-pay | Admitting: Physician Assistant

## 2024-02-22 ENCOUNTER — Ambulatory Visit

## 2024-02-22 ENCOUNTER — Telehealth (HOSPITAL_BASED_OUTPATIENT_CLINIC_OR_DEPARTMENT_OTHER): Payer: Self-pay

## 2024-02-22 NOTE — Telephone Encounter (Signed)
 Left voice message advising patient that they missed their scheduled appointment today. Pt was advised of next scheduled appointment and reminded of rehab services no show/cancellation policy.    Mardene Celeste, PTA, Lic # [id number]

## 2024-02-25 ENCOUNTER — Telehealth (HOSPITAL_BASED_OUTPATIENT_CLINIC_OR_DEPARTMENT_OTHER): Payer: Self-pay | Admitting: Internal Medicine

## 2024-02-25 MED ORDER — DICLOFENAC SODIUM 1 % EX GEL: 4.0000 g | Freq: Four times a day (QID) | CUTANEOUS | 2 refills | Status: DC

## 2024-02-25 NOTE — Telephone Encounter (Signed)
 I spoke to pt  Notes the diclofenac  isnt covered  She tried lidocaine  patches to no avail  Notes one tube diclofenac  only lasts her about a week.  I let her know we could look into it.

## 2024-02-25 NOTE — Telephone Encounter (Signed)
 Patient wants to call back from  a nurse or the dr regarding a script  stating the ins will not cover diclofenac  gel% at the 16g rams per day.     Please review

## 2024-02-25 NOTE — Telephone Encounter (Signed)
 I'll send in a prescription for 1 tube a week which does fall within the dosing limit of up to 16 grams per day and submitted PA request

## 2024-02-26 ENCOUNTER — Ambulatory Visit (HOSPITAL_BASED_OUTPATIENT_CLINIC_OR_DEPARTMENT_OTHER): Payer: Self-pay

## 2024-02-26 ENCOUNTER — Encounter (HOSPITAL_BASED_OUTPATIENT_CLINIC_OR_DEPARTMENT_OTHER): Payer: Self-pay | Admitting: Registered Nurse

## 2024-02-26 ENCOUNTER — Telehealth (HOSPITAL_BASED_OUTPATIENT_CLINIC_OR_DEPARTMENT_OTHER): Payer: Self-pay | Admitting: Licensed Practical Nurse

## 2024-02-26 NOTE — Telephone Encounter (Signed)
 Returned call and and spoke with patient  She reported that she fell in her shower this morning and hit her right knee  She did not hit her head and managed to get up by herself  The area below her right knee is painful and swollen  She is icing, elevating and able to bear weight.  She wants Dr. Lonia to be aware that her right knee  gives away from time to time.  Patient requested to talk to Stefano Fillers, PA-C at her earliest convenience.  Will forward the encounter to Abby and Dr. Esposito

## 2024-02-26 NOTE — Telephone Encounter (Addendum)
 Adult General Triage    I spoke with Patient:     Chief Complaint: fall     Current Day of Symptoms: on hour ago     Patient Active Problem List:     Opioid use disorder on agonist therapy (HCC)     Tobacco use disorder     Fibrocystic breast     Essential hypertension     Knee pain     Chronic low back pain     OA (osteoarthritis) of knee     H. pylori infection     Major depressive disorder, recurrent episode, severe (HCC)     Occult blood in stools     Prediabetes     Epigastric pain     Chronic obstructive pulmonary disease (HCC)     Left foot pain     Multiple polyps of colon     Acquired deformity of toe, right     Dislocation of metatarsophalangeal joint of lesser toe, right, sequela     Postmenopausal atrophic vaginitis     Abnormal loss of weight     Aneurysm of ascending aorta without rupture (HCC)     COVID-19 virus infection     BMI 32.0-32.9,adult     Chronic dysuria     LLQ pain     Osteoporosis     History of endometriosis     Lung cancer screening     Constipation, drug induced     Weakness     Cerebrovascular accident (CVA) (HCC)     Overactive bladder      [facility]) [phone] Option #4     Substance addiction (HCC)     Anxiety     Obese     S/P total knee arthroplasty, right     Status post total right knee replacement     Patellar tendon rupture, right, sequela     S/P revision of total knee, right     Gait instability     Pain of right lower extremity      Review of Patient's Allergies indicates:   Darvon                     Meperidine hcl              Comment:Nausea/vomit   Paxil [paroxetine]      Rash   Zoloft  [sertraline ]     Rash    Emergency care:     Loss of Consciousness:  No     Chest Pain:  No     Severe Difficulty Breathing:  No     Concern for life threatening condition:  No     The patient has the following symptoms:     Patient reports slipped getting out of the shower this morning and having a fall. This occurred an hour ago. She reports no head strike  but fell onto her knees. She caught her fall with hands. Both knee are swollen, with the right knee being more swollen then left knee. Patient states that she had right knee replaced and is concerned about swelling. The pain on knees is severe. Denies other injuries. Patient is speaking in clear full sentences. Pain is severe when moving. Patient is sitting and waiting for her daughter to arrive home, who patient states can take her to [facility] ER. Patient instructed to call 911 with any safety concerns with transportation due to injury.     Pregnancy:  N/A  Home Remedies:  N/A     Advised per nursing triage protocol.         Recommended disposition for patient:  Disposition: Advised to go to Emergency Department due to appointment availability or time of day     If patient referred to UC/ED advised that they may require further follow up and testing after the visit with their primary care office.     Instructed patient to call back for any new, worsening, or worrisome symptoms or concerns any time day or night.    Advised per nursing triage protocol.     Telephone Call Outcome:  Single Call Resolution/Routed as FYI        Reason for Disposition   Injury (or injuries) that need emergency care    Protocols used: Falls and Falling-A-[place]

## 2024-02-26 NOTE — Progress Notes (Signed)
 Pt has been doing much better with regularly attending online google groups for suboxone .    States she is doing better with her ongoing medical issues.  Following up wit MD, PT, and Nursing as needed.  States her current suboxone  dose is good, and has had no close calls or cravings.

## 2024-02-26 NOTE — Telephone Encounter (Signed)
-----   Message from Debby ORN sent at 02/26/2024 10:50 AM EDT -----  Regarding: ESPISITO pt  Contact: [phone]  Pt called to say she took another fall and banged her right knee. Pt is in severe pain and her knee is very swollen. Pt looking to speak w/ ABBY. Please call and advise

## 2024-02-28 NOTE — Progress Notes (Signed)
 Orthopedic Office Note    CC: Patient presents with:  Follow Up      ORTHOPEDIC PROBLEM LIST:  1. S/p right TKA, DOS: 03/11/2023, Doherty  2. Open right patella tendon rupture (grade 3), DOI: 03/26/2023, s/p poly exchange, irrigation/wound debridement, patellar tendon repair with cerclage cable, DOS: 03/29/2023, Fleeta Lobe  3. S/p revision right TKA to constrained prosthesis, extensor mechanism reconstruction, patellectomy and ROH, DOS: 05/19/23, Lonia    HPI: Cheryl Dixon is a 68 year old female who presents today for postop evaluation for her right knee. She is 9 months post op.    To review: Patient has had a complicated postop course regarding her right knee. She was initially a patient of Dr. Johnnette who underwent a primary total knee replacement without complication on 03/11/2023. Unfortunately, she sustained a mechanical fall on 03/26/2023; she was sitting in a chair that was lower than she had expected; felt a slight popping sensation and immediately had pain, bleeding. She was found to have a 2.5 cm inferior wound dehiscence as well as evidence for a patellar tendon rupture. She was placed into a knee immobilizer. She subsequently had a new injury on 03/28/2023 where her right leg gave out while wearing the knee immobilizer causing increased bleeding. She was BIBA to the ED and admitted to the medical service in anticipation of surgical intervention. On exam she was found to have a grade 3 open dehiscence of a right total knee arthroplasty wound and subsequently underwent an open right patellar tendon rupture repair, irrigation and excisional debridement, antibiotic bead placement, extensor tendon tendon reinforcement with cerclage cable, poly exchange and closure. She followed up with Dr. Fleeta Knights office on 04/14/2023 and x-ray/clinical evaluation showed evidence for failure of the patellar tendon repair with patella alta and displacement of the cable. She had an infectious work up with Dr.  Lonia that was negative; she underwent surgery on 05/19/23 for a right knee revision to constrained TKA with extensor mechanism reconstruction. She discharged home the next day after she was put into a long leg cast on the floor with a window for her Wound Vac.  Cast was modified 06/29/2023 and ultimately discontinued on 08/10/23. She was in a bledsoe brace. She follows up today.    She has been briefly lost to follow up. Unfortunately in the interim she has had multiple falls, potentially up to 10 according to the patient. She states she was in the ER after each fall; ER visits from 6/14, 9/2 were documented in our EMR. Our team was made aware of her falls and made follow up appointments that were subsequently cancelled. She feels the leg is giving out. She utilizes a walker consistently. She is still sore from her most recent fall. She is still struggling with bilateral shoulder pain from bilateral GH OA which is managed by Dr. Homer.     She was sent to PT post-op; VNA was obtained and during this time she was able to achieve full active motion and could SLR. Our team received a message that she had graduated from VNA and an external referral was placed. Patient states that she was not contacted by our outpatient PT; she did attend 1 session on 11/19/23.     Recently, on 02/06/2024 she had another fall after she just turned around.  She had head strike but no loss of consciousness.  She continues to undergo PT at home.  She ambulates with a walker.    IMAGING: x-ray of the right  knee taken today shows stable appearance of the constrained TKA in good alignment. No evidence for hardware failure. Some cement lateral to the proximal tibia presumably from extrusion out of the hole from previous cerclage cable. No acute fractures or dislocations.        PHYSICAL EXAM:  GENERAL: Alert and oriented, in no acute distress  MOOD: Appropriate.   MUSCULOSKELETAL: Evaluation of the right knee reveals no gross bony  deformities. Surgical wound is well healed without signs of infection.  No additional overlying skin changes including erythema, edema, lacerations, abrasions. Mildly diffusely TTP throughout. No palpable deficits in the extensor mechanism repair. PROM: 0-115 deg of flexion with 10-20 deg extensor lag. The knee is stable in the coronal and sagittal planes. Thighs and calves soft and NTTP bilaterally. NVI distally.    ASSESSMENT/PLAN: 68 year old female who presents today now 9 months postop from a right revision total knee replacement to a constrained prosthesis, extensor mechanism reconstruction, patellectomy on 05/19/2023.     She has unfortunately fallen multiple times in the last few months. Luckily her extensor mechanism reconstruction is still intact and no evidence for fracture.     She has an extensor lag today which appears to be 2/2 significant quad weakness. Extensive discussion today regarding the importance of PT. Discussed that if she falls and sustains a periprosthetic fracture she may be looking at amputation. She understands.     Continue PT.    FU in 3 months for re-evaluation and XOA right knee.    We reviewed the likely diagnosis, the prognosis, and various treatment options in detail. Risks and benefits of treatment plan discussed. The patient's questions have been answered, and the patient understands agrees with treatment plan.         A total of 45 minutes were spent today performing a complete history and physical exam, coordinating patient care, planning for follow up, reviewing history and performing appropriate documentation.      [name], MD

## 2024-02-29 ENCOUNTER — Encounter: Payer: Self-pay | Admitting: Radiology

## 2024-03-01 ENCOUNTER — Ambulatory Visit (HOSPITAL_BASED_OUTPATIENT_CLINIC_OR_DEPARTMENT_OTHER): Admitting: Rehabilitative and Restorative Service Providers"

## 2024-03-01 ENCOUNTER — Other Ambulatory Visit: Payer: Self-pay

## 2024-03-01 DIAGNOSIS — M25561 Pain in right knee: Secondary | ICD-10-CM | POA: Diagnosis present

## 2024-03-01 DIAGNOSIS — G8929 Other chronic pain: Secondary | ICD-10-CM | POA: Insufficient documentation

## 2024-03-01 DIAGNOSIS — Z96651 Presence of right artificial knee joint: Secondary | ICD-10-CM | POA: Insufficient documentation

## 2024-03-01 NOTE — Progress Notes (Signed)
 S: Is there anything I can do for my arms? Reports arm pain, had another fall two days ago.     O:    03/01/24 1000   Precautions   Precautions Orthopedic Precautions   Orthopedic Precautions   Procedure/Injury TKA revision (most recent)   Specific Restrictions do not push flexion   Rehab Discipline   Rehab Discipline PT   Visit   Visit number 3   Recert Due Date  03/04/24   Time Calculation   Start Time 1005  (on time but sitting by primary care, unable to find for a few minutes)   Stop Time 1030   Time Calculation (min) 25 min   Pain   Pain Score 0    Ther Exercise   Exercise NMES with QS   Holds 10'   Ther Exercise 2   Exercise LAQ   Reps 2 10   Holds 2 assist with other leg   Ther Exercise 3   Exercise 3 sit<>stand   Reps 3 10   Modalities   Type of modalities Electrical stimulation   Electrical Stimulation   Joints Right;Knee   Position Supine   Time in minutes 10   Parameters NMES with QS   Patient Education   What was taught? continue HEP   Method Verbal;Practice   Patient comprehension Yes     A: Initiated NMES for quad. New bruises today- L eye, medial R knee. Reports frequent falls. Arms sore from catching herself. Has TENS unit at home, will try turning it up to utilize for NMES at home, given pads from today.     P: Continue focus on quad recruitment to prevent buckling/falls. Recertify next.    Maurine LITTIE Holt, PT, Lic # [id number]

## 2024-03-07 ENCOUNTER — Other Ambulatory Visit (HOSPITAL_BASED_OUTPATIENT_CLINIC_OR_DEPARTMENT_OTHER): Payer: Self-pay | Admitting: Internal Medicine

## 2024-03-07 DIAGNOSIS — F419 Anxiety disorder, unspecified: Secondary | ICD-10-CM

## 2024-03-07 NOTE — Telephone Encounter (Signed)
 PER who is calling: Pharmacy, Cheryl Dixon is a 68 year old female has requested a refill of       Klonopin .        Only complete for controlled medication:    Previously prescribed:  Start Date 01/25/24         End Date 02/22/24             Last Office Visit  : 01/18/2024 [name], MD      Last Tele Visit:  02/18/2024      Last Physical Exam:  05/27/2013    There are no preventive care reminders to display for this patient.    Other Med Adult:  Most Recent BP Reading(s)  02/06/24 : 163/100        Cholesterol (mg/dL)   Date Value   95/75/7975 212     LOW DENSITY LIPOPROTEIN DIRECT (mg/dL)   Date Value   95/75/7975 151     HIGH DENSITY LIPOPROTEIN (mg/dL)   Date Value   95/75/7975 47     TRIGLYCERIDES (mg/dL)   Date Value   91/82/7976 119         THYROID  SCREEN TSH REFLEX FT4 (uIU/mL)   Date Value   08/20/2022 4.100         No results found for: TSH    HEMOGLOBIN A1C (%)   Date Value   08/20/2022 5.6       No results found for: POCA1C      INR (no units)   Date Value   12/12/2021 1.1   02/16/2007 1.0 (L)   07/03/2006 < 1.0 (L)       SODIUM (mmol/L)   Date Value   01/18/2024 140       POTASSIUM (mmol/L)   Date Value   01/18/2024 4.0           CREATININE (mg/dL)   Date Value   90/77/7974 0.9       Documented patient preferred pharmacies:    [id number] - [place], [place] - [address]  Phone: [phone] Fax: [phone]

## 2024-03-08 ENCOUNTER — Ambulatory Visit: Attending: Clinical | Admitting: Clinical

## 2024-03-08 ENCOUNTER — Other Ambulatory Visit (HOSPITAL_BASED_OUTPATIENT_CLINIC_OR_DEPARTMENT_OTHER): Payer: Self-pay | Admitting: Family Medicine

## 2024-03-08 ENCOUNTER — Ambulatory Visit: Attending: Family Medicine | Admitting: Family Medicine

## 2024-03-08 DIAGNOSIS — F1120 Opioid dependence, uncomplicated: Secondary | ICD-10-CM | POA: Diagnosis not present

## 2024-03-08 MED ORDER — BUPRENORPHINE HCL-NALOXONE HCL 8-2 MG SL FILM: ORAL_FILM | SUBLINGUAL | 0 refills | Status: DC

## 2024-03-08 NOTE — Progress Notes (Signed)
 Cheryl Dixon is a 68 year old female seen in follow up for opioid use disorder:    Buprenorphine  dose: 3.5 films a day rx'ed (previously 2.5 films a day - increased iso pain, arthritis, surgeries)  Response, adequacy of dose: as above  Relapses/close calls: No  Meetings: attends every 4+ week  Non-prescribed substance use: None reported    Social history/events:  Doing well    Additional psychiatric or medical care/events:  None    Present Medications:  clonazePAM  (KLONOPIN ) 0.5 MG tablet, TAKE 1 TABLET BY MOUTH 2 TIMES DAILY AS NEEDED FOR ANXIETY., Disp: 28 tablet, Rfl: 1  diclofenac  (VOLTAREN ) 1 % GEL Gel, Apply 4 g topically in the morning and 4 g at noon and 4 g in the evening and 4 g before bedtime. (4 tubes per month, up to 16 grams per day)., Disp: 400 g, Rfl: 2  celecoxib  (CELEBREX ) 200 MG capsule, Take 200 mg by mouth daily, Disp: , Rfl:   buPROPion  (WELLBUTRIN  SR) 200 MG 12 hr tablet, TAKE 1 TABLET BY MOUTH IN THE MORNING AND BEFORE BEDTIME, Disp: 180 tablet, Rfl: 3  solifenacin  (VESICARE ) 10 MG tablet, TAKE 1 TABLET BY MOUTH EVERY DAY IN THE MORNING, Disp: 90 tablet, Rfl: 3  STIOLTO RESPIMAT  2.5-2.5 MCG/ACT inhal, INHALE 2 PUFFS INTO THE LUNGS IN THE MORNING. (Patient not taking: Reported on 02/18/2024), Disp: 4 g, Rfl: 11  [DISCONTINUED] buprenorphine -naloxone  (SUBOXONE ) 8-2 MG sublingual film, Take 1.5 films under the tongue in the morning, 1 film midday, and 1 film at night, Disp: 98 Film, Rfl: 0  Acetaminophen  Extra Strength 500 MG TABS, TAKE 1 TABLET BY MOUTH EVERY 8 HOURS AS NEEDED FOR PAIN, Disp: 270 tablet, Rfl: 0  OTHER MEDICATION, SHOWER CHAIR  For use as directed.    DX: gait instability, OA knee  ICD10:R26.81, M17.9, Disp: 1 each, Rfl: 0  calcium  carb-cholecalciferol  500-10 MG-MCG TABS tablet, Take 1 tablet by mouth in the morning and 1 tablet before bedtime. (Patient not taking: Reported on 01/27/2024), Disp: 180 tablet, Rfl: 3  magnesium  oxide (MAG-OX) 400 (240 Mg) MG tablet, Take 1 tablet by  mouth daily, Disp: 90 tablet, Rfl: 3  Cholecalciferol  (VITAMIN D3) 50 MCG (2000 UT) TABS, TAKE 1 TABLET BY MOUTH EVERY DAY IN THE MORNING, Disp: 90 tablet, Rfl: 3  polyethylene glycol (GLYCOLAX /MIRALAX ) 17 g packet, Take 1 packet by mouth daily, Disp: 90 packet, Rfl: 3  albuterol  HFA 108 (90 Base) MCG/ACT inhaler, Inhale 2 puffs into the lungs every 6 (six) hours as needed for Wheezing, Disp: , Rfl:   docusate sodium  (COLACE) 100 MG capsule, TAKE 1 CAPSULE BY MOUTH IN THE MORNING AND BEFORE BEDTIME, Disp: 180 capsule, Rfl: 3    No current facility-administered medications on file prior to visit.      Last UDS:  LAST UDS   AMPHETAMINES URINE (ng/mL)   Date Value   10/27/2022 NEGATIVE     COCAINE  METABOLITES URINE (ng/mL)   Date Value   10/27/2022 NEGATIVE     OPIATES URINE (ng/mL)   Date Value   10/27/2022 NEGATIVE     BENZODIAZEPINES URINE (ng/mL)   Date Value   10/27/2022 NEGATIVE     CANNABINOIDS URINE (ng/mL)   Date Value   10/27/2022 POSITIVE (A)     ETHANOL URINE (mg/dL)   Date Value   93/92/7976 NEGATIVE       Review of Systems: no notable symptoms    PHYSICAL EXAMINATION: by video  General appearance - healthy female in  no distress  Eyes - pupils normal  Skin - warm and dry  Neuro - nonfocal  Affect - normal    Problem List Items Addressed This Visit          Mental Health    Opioid use disorder on agonist therapy (HCC)    Relevant Medications    buprenorphine -naloxone  (SUBOXONE ) 8-2 MG sublingual film         Today in group we discussed rumination. [name] contributed.    Assessment:  Opioid use disorder  Comment: the patient actively participated and shared experiences, interacting appropriately with group without barriers to understanding  Plan:  Continue suboxone  to 3.5 films daily dose divided TID (1.5 films in AM, 1 film mid day, and 1 film at night).  There are days she takes 3 films a day but others where she takes 3.5 films a day if needed for pain control.  Patient is counseled regarding relapse  prevention, involvement in recovery groups, and potential side effects of buprenorphine .    Estefana Burton, MD, 03/08/2024

## 2024-03-09 NOTE — Progress Notes (Signed)
 Outreach call placed to patient with reminder to come to today's group meeting for suboxone .  Link/number sent that will enable access to group along with instructions for in person attendance.  Chart review complete.    Patient was present at today's suboxone  group.  Actively participated in the instructor lead/prompted group discussion with meaningful content.  Cheryl Dixon states she is starting to do better health wise and is getting ready for the holidays.    States doing well with current dose of suboxone .  Denies any close calls or cravings.  Doing otherwise well on program.

## 2024-03-10 ENCOUNTER — Telehealth (HOSPITAL_BASED_OUTPATIENT_CLINIC_OR_DEPARTMENT_OTHER): Payer: Self-pay

## 2024-03-10 ENCOUNTER — Other Ambulatory Visit: Payer: Self-pay

## 2024-03-10 ENCOUNTER — Ambulatory Visit: Admitting: Rehabilitative and Restorative Service Providers"

## 2024-03-10 DIAGNOSIS — M25561 Pain in right knee: Secondary | ICD-10-CM

## 2024-03-10 DIAGNOSIS — Z96651 Presence of right artificial knee joint: Secondary | ICD-10-CM | POA: Diagnosis present

## 2024-03-10 DIAGNOSIS — G8929 Other chronic pain: Secondary | ICD-10-CM | POA: Diagnosis present

## 2024-03-10 MED ORDER — CELECOXIB 200 MG PO CAPS: 200.0000 mg | ORAL_CAPSULE | Freq: Two times a day (BID) | ORAL | 1 refills | Status: DC

## 2024-03-10 NOTE — Telephone Encounter (Signed)
 Called and reached patient, she reports she has been dealing with shoulder pain for 8 months, it is not improving, it is affecting her daily activity, reports she has seen providers for this, she is taking tylenol , naproxen , and diclofenac  gel and nothing has helped. Per provider's last note was to try PT, reports PT advised her need to work on knee first, did provide some exercises she can do which she has tried to do with no effect.  Feels she needs something else for pain.  Advised I would send message to provider.

## 2024-03-10 NOTE — Telephone Encounter (Signed)
 Cheryl Dixon [id number], 68 year old, female   Telephone Information:   Home Phone [phone]   Work Phone Not on file.   Mobile [phone]       Calls today: Other: Pt is calling as she is in a lot of pain in her shoulders and the meds she is using isn't working for her. She is requesting pain meds and she is crying and upset. Pt refused NAL.    Person calling on behalf of patient: who is calling: Patient (self)    CALL BACK NUMBER: [phone]      Patient's language of care: English    Patient does not need an interpreter.    Patient's PCP: [name], MD    Primary Care Home Site:  [facility]

## 2024-03-10 NOTE — Telephone Encounter (Signed)
 At 16 grams per day the tube should last 6.25 days so she should have enough.  Use less with each dose if it doesn't last because 16 grams/day is max dose

## 2024-03-10 NOTE — Progress Notes (Signed)
 03/10/24 0800   Precautions   Precautions Orthopedic Precautions   Orthopedic Precautions   Procedure/Injury TKA revision (most recent)   Specific Restrictions do not push flexion   Rehab Discipline   Rehab Discipline PT   Visit   Visit number 4   Recert Due Date  04/09/24   Time Calculation   Start Time 0800   Stop Time 0830   Time Calculation (min) 30 min   Pain   Pain Score 0    Ther Exercise   Exercise NMES with QS/LAQ   Holds 10'   Ther Exercise 2   Exercise prone HS curl 10#   Reps 2 10   Sets 2 2   Electrical Stimulation   Joints Right;Knee   Position Supine   Time in minutes 10   Parameters NMES with QS/LAQ   Patient Education   What was taught? continue HEP   Method Verbal;Practice   Patient comprehension Yes       Maurine LITTIE Holt, PT, Lic # [id number]

## 2024-03-10 NOTE — Progress Notes (Signed)
 Outpatient Physical Therapy Recertification 03/10/24  [facility]: [facility]    Patient Name: Cheryl Dixon  DOB: Jun 11, 1955  Referring Provider: Redgie Chroman, Fontaine Redo, MD   Date of Onset: 05/19/23  Diagnosis: Chronic pain of right knee  ICD-10: M25.561, G89/29    Subjective: s/p right TKA 03/11/2023; Open right patella tendon rupture 03/26/2023, s/p poly exchange, irrigation/wound debridement, patellar tendon repair with cerclage cable 03/29/2023, and  most recently s/p revision right TKA to constrained prosthesis, extensor mechanism reconstruction, patellectomy and [facility] 05/19/23  Pain:0/10 currently, 4/10 at worst. Better with rest/most of the time; worse with full extension. Pain is described as a throbbing and is located anterior knee. (+) buckling  ADL's: recent fall with tub transfer, does have shower chair and grab bars. Typically independent but not easy. Modified/difficulty getting dressed. Doing all household chores with difficulty.   Recreational Activities: likes to walk on a treadmill, hasn't been able to do it safely.    Contraindications/Precautions: HTN, prediabetes    Objective:   Please Note: Only populated fields were assessed by provider, fields left blank were not assessed. * denotes pain with testing.  Balance: fair static SLS R/fair static SLS L  Gait: ambulates with non-antalgic gait with RW  MMT   RIGHT LEFT   Plantarflexion 4/5 4/5   Quad 2+/5 5/5   Hamstrings 4/5 4+/5   Ant tib 5/5 5/5   Glut med  5/5 NT/5   Glut max  4+/5 NT/5   Muscle Length - WNL  Palpation:  TTP: min-mod R quad  Tissue Density: WNL  Skin Integrity: healed incision      Plan of Care    Referring Provider: Chroman Redgie, Fontaine Redo, MD  Diagnosis: Chronic pain of right knee.    Assessment:   Cheryl Dixon is a 68 year old y/o female who presents today after completing four visits of physical therapy s/p right TKA 03/11/2023; Open right patella tendon rupture 03/26/2023, s/p poly  exchange, irrigation/wound debridement, patellar tendon repair with cerclage cable 03/29/2023, and  most recently s/p revision right TKA to constrained prosthesis, extensor mechanism reconstruction, patellectomy and [facility] 05/19/23. Pt has made improvements thus far objectively in strength. However, she does continue to have deficits in strength, particularly of quad. These deficits have continued to impact her ability to perform ADL's, frequent falls. Pt continues to be motivated to improve and compliant with her HEP. She would benefit from continued skilled physical therapy to address her current impairments and allow for return to prior level of function.       GOALS: all progressing   Short Term Functional Goals: 4 weeks.   Patient will demonstrate improved strength to 4/5 throughout BLE's  Patient will be independent with initial HEP    Long Term Goal: 8 weeks.   Patient will demonstrate improved strength to 5/5 throughout so that she will be able to complete all ADL's and household chores with minimal pain  Patient will demonstrate improved knee stability so that she will be able to ascend/descend stairs with step-to pattern, with minimal pain  Patient will be independent with HEP      Treatment Plan:  ** Stretching/ROM/Therapeutic Exercise (97110)  ** Self-Care/Home Management Training (CPT [phone])  ** Neuromuscular Re-education (CPT [id number])  ** Manual Therapy / Joint / Soft tissue Mobilization (CPT 97140)  ** Electrical Stimulation/TENS (CPT 97014)  ** Hot/Cold Rx (CPT 97010)  ** Gait Training (CPT [id number])  ** Functional Activities (CPT 97530)  Recommend skilled physical therapy services for 1 time per week for 8 additional weeks. Updated plan of care will be completed every 30 days.    Thank you for your referral. Please feel free to contact me with any questions or concerns.    Maurine LITTIE Holt, PT, Lic # [id number]

## 2024-03-10 NOTE — Telephone Encounter (Signed)
 She can increase her celebrex  to twice a day in the short run.  Continue topical diclofenac .  Beyond that talk to ortho about whether an injection in the left shoulder could be helpful

## 2024-03-10 NOTE — Telephone Encounter (Signed)
 Called and reached patient, advised her of message from provider, she verbalized understanding. Reports she had top purchase diclofenac  OTC because she ran out too soon-4 tubes do not last the month. Reviewed dosing and she states she is using as prescribed.

## 2024-03-11 NOTE — Progress Notes (Signed)
 OUTPATIENT PSYCHIATRY GROUP PROGRESS NOTE    For the patient participating in the group via telephone and/or video technology: The patient/guardian has verbally consented to participate in this group therapy session by televisit. The patient/guardian was encouraged to be in a private location due to personal health information being discussed and where they could not be overheard by individuals not participating in the group therapy. Although all [facility] staff/providers participating in this group therapy televisit are in a private location, all the risks of telephone and/or video technology used to conduct this group therapy session cannot be controlled by [facility], (i.e. interruptions, unauthorized access/recording and technical difficulties).  Each patient understands that the visit can be stopped at any time for any reason, including if the conferencing connections are inadequate.    Cheryl Dixon is a 68 year old, Divorced, female, who presented for [facility] group visit.   Length of group: 60 min which included 30 min of Health Behavior Intervention and OUD Psychoeducation.    Group Name: OBOT: Office Based Opioid Treatment Group    Group Topic Today: Rumination and Getting Stuck in the Past     Group Description: This is a supportive, PCBHI team-led recovery group for participants working on maintaining wellness, navigating OUD recovery, and building meaningful lives. Group sessions focus on reflection, connection, and developing practical strategies for personal growth. Members are encouraged to share openly, offer mutual support, and explore themes such as identity, coping skills, relationships, purpose, and resilience. Each week, we introduce a focused discussion topic to guide conversation and foster deeper insight. The group offers a consistent space for members to check in, set intentions, and stay grounded in their recovery goals while learning from one another's experiences. Today, group members shared and discussed  their recovery experience. Group members explored strategies to reduce relapse risks and increase coping mechanisms to support healthy change.      Leaders:  Estefana Burton, MD, Suad Autrey, Ph.D., Arnulfo Ohara, RN    Service Type: [phone] Group Psychotherapy     Number of participants in group today:  12        Purpose of Group (choose all that apply):   Skills training/rehab  Support (psychological, family, community resources, recovery)  Insight and behavior change  Increasing health promoting behaviors  Relapse prevention and enhancing Recovery Process  Education about Opioid Use Disorder (OUD) and treatment  Enhancing adjustment and coping with OUD  Improving management of OUD and treatment adherence  Decreasing health risk behaviors and risks of relapse  Addressing psychological/psychosocial/behavioral barriers to preventing addiction and relapse        Group Process:    Review of group goals/structure  Education about Opioid Use Disorder (OUD) and related topics  Group discussion and sharing.    Review of strategies to increase healthy coping mechanisms to support recovery and change.  Review of additional relapse prevention strategies.   Discussion of support strategies  Check in    Individual Patient Participation (select all that apply):  [x]   Attentive to process  [x]   Contributed to group discussion    Subjective report of pt's progress: Pt participated in group discussion about rumination. Pt reported doing as good as it could be. She is getting ready for the Holidays.  Social History update: no major updates reported  Report on relapses and close calls:  No relapse/close calls disclosed.     Medical Necessity of Session (how treatment is necessary to improve symptoms, functioning, or prevent worsening): treatment is necessary to enhance  compliance with agonist treatment and maintain abstinence from opioids. Suboxone  group participation is mandatory to continue Suboxone  prescription and is necessary  in order to enhance coping with Opioid Use Disorder.      Relevant Changes in Mental Status: No. No acute issues reported or noted     Risk Level per Scale:  Suicide: re-assessment not indicated today  Violence: re-assessment not indicated today  Addiction: low     Current risk level represents increase in risk: No.     Plan: Pt will continue OBOT treatment     DIAGNOSES:     Opioid dependence on agonist therapy (HCC)   SNOMED CT(R): OPIOID DEPENDENCE, ON AGONIST THERAPY      REVIEWING TODAY'S VISIT:   Reviewing Today's Visit:  Clinical Interventions Today: Motivational Interviewing; Cognitive Behavioral Therapy (CBT); Relapse Prevention for SUDs  Patient's Response to Interventions: Responsive  Time Spent in Psychotherapy: 60 min            CARE PLAN/ EPISODES:  Linked Episodes   Type: Episode: Status: Noted: Resolved: Last update: Updated by:   PCBHI PCBHI OBOT  Active 09/11/2020  03/08/2024 11:03 AM [name], PhD      Comments:       Patient/Guardian:       Strengths/Skills: Help seeking and motivated to get better     Potential Barriers: Multiple life stressors       Patient stated goals: Pt would like to prevent relapses and support her recovery process     Problem 1: Opioid Dependence on Agonist Therapy (Opioid Use Disorder on Agonist Therapy)      Short Term Goals: Pt will use at least one copying skill a day to reduce a risk of relapse, enhance coping with OUD, and support her recovery process     Short Term Target:  60 days  Short TermTarget Date: 03/05/2024  Short Term Goal #1 Progress:       Long Term Goals: Pt will report 50% in urges and cravings reduction and no relapses or close calls       Long Term Target:  90 days  Long TermTarget Date:  04/04/2024   Long Term Goal #1 Progress:         Intervention #1: PCBHI-based OBOT/Group Psychotherapy   Intervention Frequency:  biweekly  Intervention Duration:  90 Days  Intervention Responsibility:  J. Zadin Lange, PhD and the OBOT team  Selvin Yun, PhD

## 2024-03-11 NOTE — Progress Notes (Signed)
 I certify that the documented Treatment Plan is reasonable and necessary.    03/11/2024  Sully Manzi, PA-C

## 2024-03-13 ENCOUNTER — Other Ambulatory Visit (HOSPITAL_BASED_OUTPATIENT_CLINIC_OR_DEPARTMENT_OTHER): Payer: Self-pay | Admitting: Internal Medicine

## 2024-03-13 DIAGNOSIS — J449 Chronic obstructive pulmonary disease, unspecified: Secondary | ICD-10-CM

## 2024-03-13 NOTE — Telephone Encounter (Signed)
 The prescribed medication is not covered by the insurance, please respond with one of the suggested covered alternatives, advise if appropriate

## 2024-03-14 ENCOUNTER — Ambulatory Visit (HOSPITAL_BASED_OUTPATIENT_CLINIC_OR_DEPARTMENT_OTHER): Admitting: Rehabilitative and Restorative Service Providers"

## 2024-03-14 NOTE — Telephone Encounter (Signed)
 Patient identification verified by 2 forms.    Called placed to patient relayed message below.   Patient verbalized understanding and agreed with plan.   Denied any further questions or concerns at this time.

## 2024-03-14 NOTE — Telephone Encounter (Signed)
 Please let patient know that I sent in refill for Anoro Ellipta because Stiolto is not covered.  Should work about the same, let me know if any problems

## 2024-03-19 ENCOUNTER — Other Ambulatory Visit (HOSPITAL_BASED_OUTPATIENT_CLINIC_OR_DEPARTMENT_OTHER): Payer: Self-pay | Admitting: Internal Medicine

## 2024-03-22 ENCOUNTER — Ambulatory Visit: Admitting: Family Medicine

## 2024-03-22 ENCOUNTER — Ambulatory Visit: Attending: Clinical | Admitting: Clinical

## 2024-03-22 DIAGNOSIS — F1120 Opioid dependence, uncomplicated: Secondary | ICD-10-CM | POA: Insufficient documentation

## 2024-03-22 NOTE — Progress Notes (Signed)
 OUTPATIENT PSYCHIATRY GROUP PROGRESS NOTE    For the patient participating in the group via telephone and/or video technology: The patient/guardian has verbally consented to participate in this group therapy session by televisit. The patient/guardian was encouraged to be in a private location due to personal health information being discussed and where they could not be overheard by individuals not participating in the group therapy. Although all [facility] staff/providers participating in this group therapy televisit are in a private location, all the risks of telephone and/or video technology used to conduct this group therapy session cannot be controlled by [facility], (i.e. interruptions, unauthorized access/recording and technical difficulties).  Each patient understands that the visit can be stopped at any time for any reason, including if the conferencing connections are inadequate.    Cheryl Dixon is a 68 year old, Divorced, female, who presented for [facility] group visit.   Length of group: 60 min which included 30 min of Health Behavior Intervention and OUD Psychoeducation.    Group Name: OBOT: Office Based Opioid Treatment Group    Group Topic Today: Gratitude and Thanksgiving     Group Description: This is a supportive, PCBHI team-led recovery group for participants working on maintaining wellness, navigating OUD recovery, and building meaningful lives. Group sessions focus on reflection, connection, and developing practical strategies for personal growth. Members are encouraged to share openly, offer mutual support, and explore themes such as identity, coping skills, relationships, purpose, and resilience. Each week, we introduce a focused discussion topic to guide conversation and foster deeper insight. The group offers a consistent space for members to check in, set intentions, and stay grounded in their recovery goals while learning from one another's experiences. Today, group members shared and discussed their recovery  experience. Group members explored strategies to reduce relapse risks and increase coping mechanisms to support healthy change.      Leaders:  Estefana Burton, MD, Beverlyann Broxterman, Ph.D., Arnulfo Ohara, RN    Service Type: [phone] Group Psychotherapy     Number of participants in group today:  12        Purpose of Group (choose all that apply):   Skills training/rehab  Support (psychological, family, community resources, recovery)  Insight and behavior change  Increasing health promoting behaviors  Relapse prevention and enhancing Recovery Process  Education about Opioid Use Disorder (OUD) and treatment  Enhancing adjustment and coping with OUD  Improving management of OUD and treatment adherence  Decreasing health risk behaviors and risks of relapse  Addressing psychological/psychosocial/behavioral barriers to preventing addiction and relapse        Group Process:    Review of group goals/structure  Education about Opioid Use Disorder (OUD) and related topics  Group discussion and sharing.    Review of strategies to increase healthy coping mechanisms to support recovery and change.  Review of additional relapse prevention strategies.   Discussion of support strategies  Check in    Individual Patient Participation (select all that apply):  [x]   Attentive to process  [x]   Observed  group discussion    Subjective report of pt's progress: Pt reported doing well. She is helping her daughter today. She is grateful for group support.  Social History update: no major updates reported  Report on relapses and close calls:  No relapse/close calls disclosed.     Medical Necessity of Session (how treatment is necessary to improve symptoms, functioning, or prevent worsening): treatment is necessary to enhance compliance with agonist treatment and maintain abstinence from opioids. Suboxone  group  participation is mandatory to continue Suboxone  prescription and is necessary in order to enhance coping with Opioid Use Disorder.       Relevant Changes in Mental Status: No. No acute issues reported or noted     Risk Level per Scale:  Suicide: re-assessment not indicated today  Violence: re-assessment not indicated today  Addiction: low     Current risk level represents increase in risk: No.     Plan: Pt will continue OBOT treatment      DIAGNOSES:     Opioid dependence on agonist therapy (HCC)   SNOMED CT(R): OPIOID DEPENDENCE, ON AGONIST THERAPY      REVIEWING TODAY'S VISIT:   Reviewing Today's Visit:  Clinical Interventions Today: Motivational Interviewing; Cognitive Behavioral Therapy (CBT); Relapse Prevention for SUDs  Patient's Response to Interventions: Responsive  Time Spent in Psychotherapy: 60 min            CARE PLAN/ EPISODES:  Linked Episodes   Type: Episode: Status: Noted: Resolved: Last update: Updated by:   PCBHI PCBHI OBOT  Active 09/11/2020  03/22/2024 10:58 AM [name], PhD      Comments:       Patient/Guardian:       Strengths/Skills: Help seeking and motivated to get better     Potential Barriers: Multiple life stressors       Patient stated goals: Pt would like to prevent relapses and support her recovery process     Problem 1: Opioid Dependence on Agonist Therapy (Opioid Use Disorder on Agonist Therapy)      Short Term Goals: Pt will use at least one copying skill a day to reduce a risk of relapse, enhance coping with OUD, and support her recovery process     Short Term Target:  60 days  Short TermTarget Date: 03/05/2024  Short Term Goal #1 Progress:       Long Term Goals: Pt will report 50% in urges and cravings reduction and no relapses or close calls       Long Term Target:  90 days  Long TermTarget Date:  04/04/2024   Long Term Goal #1 Progress:         Intervention #1: PCBHI-based OBOT/Group Psychotherapy   Intervention Frequency:  biweekly  Intervention Duration:  90 Days  Intervention Responsibility:  J. Gracelee Stemmler, PhD and the OBOT team  Shiara Mcgough, PhD

## 2024-03-23 ENCOUNTER — Other Ambulatory Visit: Payer: Self-pay

## 2024-03-23 ENCOUNTER — Ambulatory Visit: Attending: Physician Assistant | Admitting: Rehabilitative and Restorative Service Providers"

## 2024-03-23 DIAGNOSIS — M25561 Pain in right knee: Secondary | ICD-10-CM | POA: Diagnosis present

## 2024-03-23 DIAGNOSIS — Z96651 Presence of right artificial knee joint: Secondary | ICD-10-CM | POA: Diagnosis present

## 2024-03-23 DIAGNOSIS — G8929 Other chronic pain: Secondary | ICD-10-CM | POA: Diagnosis present

## 2024-03-23 NOTE — Progress Notes (Signed)
 S: It's been okay, I make do.    O:    03/23/24 1300   Precautions   Precautions Orthopedic Precautions   Orthopedic Precautions   Procedure/Injury TKA revision (most recent)   Specific Restrictions do not push flexion   Rehab Discipline   Rehab Discipline PT   Visit   Visit number 5   Recert Due Date  04/09/24   Time Calculation   Start Time 1320   Stop Time 1345   Time Calculation (min) 25 min   Pain   Pain Score 0    Ther Exercise   Exercise NMES with QS/LAQ   Holds 10'   Ther Exercise 2   Exercise elevated sit<>stand   Reps 2 30   Ther Exercise 3   Exercise 3 SL LP (assist of PT) 11#   Reps 3 10   Sets 3 3   Electrical Stimulation   Joints Right;Knee   Position Supine   Time in minutes 10   Parameters NMES with QS/LAQ   Patient Education   What was taught? continue HEP   Method Verbal;Practice   Patient comprehension Yes     A: Continues to lack full quad control.     P: Continue strengthening    Maurine LITTIE Holt, PT, Lic # [id number]

## 2024-03-26 ENCOUNTER — Other Ambulatory Visit (HOSPITAL_BASED_OUTPATIENT_CLINIC_OR_DEPARTMENT_OTHER): Payer: Self-pay | Admitting: Family Medicine

## 2024-03-26 DIAGNOSIS — F1120 Opioid dependence, uncomplicated: Secondary | ICD-10-CM

## 2024-03-26 NOTE — Telephone Encounter (Signed)
 PER who is calling: Pharmacy, Cheryl Dixon is a 68 year old female has requested a refill of       suboxone .        Only complete for controlled medication:    Previously prescribed:  Start Date 03/08/24         End Date 04/06/24             Last Office Visit  : 03/19/2024 [name], MD      Last Tele Visit:  03/22/2024      Last Physical Exam:  05/27/2013    There are no preventive care reminders to display for this patient.    Other Med Adult:  Most Recent BP Reading(s)  02/06/24 : 163/100        Cholesterol (mg/dL)   Date Value   95/75/7975 212     LOW DENSITY LIPOPROTEIN DIRECT (mg/dL)   Date Value   95/75/7975 151     HIGH DENSITY LIPOPROTEIN (mg/dL)   Date Value   95/75/7975 47     TRIGLYCERIDES (mg/dL)   Date Value   91/82/7976 119         THYROID  SCREEN TSH REFLEX FT4 (uIU/mL)   Date Value   08/20/2022 4.100         No results found for: TSH    HEMOGLOBIN A1C (%)   Date Value   08/20/2022 5.6       No results found for: POCA1C      INR (no units)   Date Value   12/12/2021 1.1   02/16/2007 1.0 (L)   07/03/2006 < 1.0 (L)       SODIUM (mmol/L)   Date Value   01/18/2024 140       POTASSIUM (mmol/L)   Date Value   01/18/2024 4.0           CREATININE (mg/dL)   Date Value   90/77/7974 0.9       Documented patient preferred pharmacies:    [id number] - [place], [place] - [address]  Phone: [phone] Fax: [phone]

## 2024-03-28 ENCOUNTER — Telehealth (HOSPITAL_BASED_OUTPATIENT_CLINIC_OR_DEPARTMENT_OTHER): Payer: Self-pay

## 2024-03-28 NOTE — Telephone Encounter (Signed)
 Call returned to the patient.  Rx'd 98 films of suboxone  on 03/08/24, 29 day supply.  Patient states she believes that she was given 42 films, but  may have misplaced a box of films, will look around her home for it. She states she will be fine however, no need for a new RX at this time.  Will update provider.  Jerin Franzel  RN

## 2024-03-28 NOTE — Telephone Encounter (Signed)
-----   Message from Cheryl Dixon Burton sent at 03/28/2024  9:51 AM EST -----  Can you call patient to inquire, she is requesting suboxone  refill but on PDMP should have enough until 04/06/2024. Can she double check does she have enough med until then, or did something happen to the medication?    Thanks,  Cheryl Dixon

## 2024-03-29 ENCOUNTER — Other Ambulatory Visit (HOSPITAL_BASED_OUTPATIENT_CLINIC_OR_DEPARTMENT_OTHER): Payer: Self-pay | Admitting: Physician Assistant

## 2024-03-29 DIAGNOSIS — Z96651 Presence of right artificial knee joint: Secondary | ICD-10-CM

## 2024-03-29 NOTE — Telephone Encounter (Addendum)
 RX for Tylenol  500 mg po every 8 hours as needed for pain was sent to the pharmacy  today  Call placed to the patient to let her know of the RX sent today for:  500mg , one tab every 8 hours. Patient verbalized understanding after reading directions.She had called prior to knowing about this RX being sent.    No questions at the end of today's call.    Lynnlee Revels RN        [facility]oz[facility] Pride  [facility]  Hello,      Pt calling today asking to a new script of tylenol . The one that dr. Prescribed is not strong enough to hold the pain pt having. Requested for 2 tabs (500mg ) to 4-6 times a day If possible. Please reach out to pt if any question.      Please review and advise. Thank you

## 2024-03-29 NOTE — Telephone Encounter (Signed)
 PER who is calling: Patient (self), Cheryl Dixon is a 68 year old female has requested a refill of       tylenol .        Last Office Visit  : 03/28/2024 Last, Thaddeus LABOR, RN      Last Tele Visit:  03/22/2024      Last Physical Exam:  05/27/2013    There are no preventive care reminders to display for this patient.    Other Med Adult:  Most Recent BP Reading(s)  02/06/24 : 163/100        Cholesterol (mg/dL)   Date Value   95/75/7975 212     LOW DENSITY LIPOPROTEIN DIRECT (mg/dL)   Date Value   95/75/7975 151     HIGH DENSITY LIPOPROTEIN (mg/dL)   Date Value   95/75/7975 47     TRIGLYCERIDES (mg/dL)   Date Value   91/82/7976 119         THYROID  SCREEN TSH REFLEX FT4 (uIU/mL)   Date Value   08/20/2022 4.100         No results found for: TSH    HEMOGLOBIN A1C (%)   Date Value   08/20/2022 5.6       No results found for: POCA1C      INR (no units)   Date Value   12/12/2021 1.1   02/16/2007 1.0 (L)   07/03/2006 < 1.0 (L)       SODIUM (mmol/L)   Date Value   01/18/2024 140       POTASSIUM (mmol/L)   Date Value   01/18/2024 4.0           CREATININE (mg/dL)   Date Value   90/77/7974 0.9       Documented patient preferred pharmacies:    [id number] - [place], [place] - [address]  Phone: [phone] Fax: [phone]

## 2024-03-30 ENCOUNTER — Ambulatory Visit: Admitting: Orthopaedic Surgery

## 2024-03-30 ENCOUNTER — Ambulatory Visit (HOSPITAL_BASED_OUTPATIENT_CLINIC_OR_DEPARTMENT_OTHER): Admitting: Physical Therapy

## 2024-03-30 ENCOUNTER — Other Ambulatory Visit (HOSPITAL_BASED_OUTPATIENT_CLINIC_OR_DEPARTMENT_OTHER): Payer: Self-pay

## 2024-03-30 DIAGNOSIS — Z96651 Presence of right artificial knee joint: Secondary | ICD-10-CM

## 2024-04-01 ENCOUNTER — Ambulatory Visit: Admitting: Orthopaedic Surgery

## 2024-04-04 MED ORDER — ACETAMINOPHEN EXTRA STRENGTH 500 MG PO TABS: 500.0000 mg | ORAL_TABLET | Freq: Four times a day (QID) | ORAL | 0 refills | Status: DC | PRN

## 2024-04-04 NOTE — Addendum Note (Signed)
 Addended by: Mariavictoria Nottingham on: 04/04/2024 07:44 PM     Modules accepted: Orders

## 2024-04-04 NOTE — Telephone Encounter (Signed)
 Received call from patient stating she needs provider to update the directions on the Tylenol  and send a new script for 1 tab every 4-6 hours as needed       Please send new script or call patient to discuss further    Thank you!

## 2024-04-05 ENCOUNTER — Encounter (HOSPITAL_BASED_OUTPATIENT_CLINIC_OR_DEPARTMENT_OTHER): Payer: Self-pay

## 2024-04-05 ENCOUNTER — Encounter (HOSPITAL_BASED_OUTPATIENT_CLINIC_OR_DEPARTMENT_OTHER): Payer: Self-pay | Admitting: Registered Nurse

## 2024-04-05 ENCOUNTER — Ambulatory Visit: Attending: Clinical | Admitting: Clinical

## 2024-04-05 ENCOUNTER — Ambulatory Visit: Attending: Family Medicine | Admitting: Family Medicine

## 2024-04-05 DIAGNOSIS — F1120 Opioid dependence, uncomplicated: Secondary | ICD-10-CM

## 2024-04-05 DIAGNOSIS — Z96651 Presence of right artificial knee joint: Secondary | ICD-10-CM | POA: Diagnosis present

## 2024-04-05 DIAGNOSIS — L03019 Cellulitis of unspecified finger: Secondary | ICD-10-CM

## 2024-04-05 DIAGNOSIS — F3341 Major depressive disorder, recurrent, in partial remission: Secondary | ICD-10-CM

## 2024-04-05 MED ORDER — MUPIROCIN 2 % EX OINT: TOPICAL_OINTMENT | Freq: Three times a day (TID) | CUTANEOUS | 0 refills | Status: AC

## 2024-04-05 MED ORDER — ACETAMINOPHEN EXTRA STRENGTH 500 MG PO TABS: 1000.0000 mg | ORAL_TABLET | Freq: Three times a day (TID) | ORAL | 2 refills | Status: AC

## 2024-04-05 MED ORDER — BUPRENORPHINE HCL-NALOXONE HCL 8-2 MG SL FILM: ORAL_FILM | SUBLINGUAL | 0 refills | Status: DC

## 2024-04-05 NOTE — Progress Notes (Addendum)
 Cheryl Dixon is a 68 year old female seen in follow up for opioid use disorder:    Buprenorphine  dose: 3.5 films a day rx'ed (previously 2.5 films a day - increased iso pain, arthritis, surgeries)  Response, adequacy of dose: as above  Relapses/close calls: No  Meetings: attends every 4+ week  Non-prescribed substance use: None reported    Social history/events:  Doing ok  Doing PT for knee  Still struggling with frozen shoulder, going to focus on knee first    Additional psychiatric or medical care/events:  None    Present Medications:  Acetaminophen  Extra Strength 500 MG TABS, Take 1 tablet by mouth every 6 (six) hours as needed, Disp: 360 tablet, Rfl: 0  celecoxib  (CELEBREX ) 200 MG capsule, Take 1 capsule by mouth in the morning and 1 capsule before bedtime., Disp: 60 capsule, Rfl: 1  [DISCONTINUED] buprenorphine -naloxone  (SUBOXONE ) 8-2 MG sublingual film, Take 1.5 films under the tongue in the morning, 1 film midday, and 1 film at night, Disp: 98 Film, Rfl: 0  clonazePAM  (KLONOPIN ) 0.5 MG tablet, TAKE 1 TABLET BY MOUTH 2 TIMES DAILY AS NEEDED FOR ANXIETY., Disp: 28 tablet, Rfl: 1  diclofenac  (VOLTAREN ) 1 % GEL Gel, Apply 4 g topically in the morning and 4 g at noon and 4 g in the evening and 4 g before bedtime. (4 tubes per month, up to 16 grams per day)., Disp: 400 g, Rfl: 2  celecoxib  (CELEBREX ) 200 MG capsule, Take 200 mg by mouth daily, Disp: , Rfl:   buPROPion  (WELLBUTRIN  SR) 200 MG 12 hr tablet, TAKE 1 TABLET BY MOUTH IN THE MORNING AND BEFORE BEDTIME, Disp: 180 tablet, Rfl: 3  solifenacin  (VESICARE ) 10 MG tablet, TAKE 1 TABLET BY MOUTH EVERY DAY IN THE MORNING, Disp: 90 tablet, Rfl: 3  OTHER MEDICATION, SHOWER CHAIR  For use as directed.    DX: gait instability, OA knee  ICD10:R26.81, M17.9, Disp: 1 each, Rfl: 0  calcium  carb-cholecalciferol  500-10 MG-MCG TABS tablet, Take 1 tablet by mouth in the morning and 1 tablet before bedtime. (Patient not taking: Reported on 01/27/2024), Disp: 180 tablet, Rfl:  3  magnesium  oxide (MAG-OX) 400 (240 Mg) MG tablet, Take 1 tablet by mouth daily, Disp: 90 tablet, Rfl: 3  Cholecalciferol  (VITAMIN D3) 50 MCG (2000 UT) TABS, TAKE 1 TABLET BY MOUTH EVERY DAY IN THE MORNING, Disp: 90 tablet, Rfl: 3  polyethylene glycol (GLYCOLAX /MIRALAX ) 17 g packet, Take 1 packet by mouth daily, Disp: 90 packet, Rfl: 3  albuterol  HFA 108 (90 Base) MCG/ACT inhaler, Inhale 2 puffs into the lungs every 6 (six) hours as needed for Wheezing, Disp: , Rfl:   docusate sodium  (COLACE) 100 MG capsule, TAKE 1 CAPSULE BY MOUTH IN THE MORNING AND BEFORE BEDTIME, Disp: 180 capsule, Rfl: 3    No current facility-administered medications on file prior to visit.      Last UDS:  LAST UDS   AMPHETAMINES URINE (ng/mL)   Date Value   10/27/2022 NEGATIVE     COCAINE  METABOLITES URINE (ng/mL)   Date Value   10/27/2022 NEGATIVE     OPIATES URINE (ng/mL)   Date Value   10/27/2022 NEGATIVE     BENZODIAZEPINES URINE (ng/mL)   Date Value   10/27/2022 NEGATIVE     CANNABINOIDS URINE (ng/mL)   Date Value   10/27/2022 POSITIVE (A)     ETHANOL URINE (mg/dL)   Date Value   93/92/7976 NEGATIVE       Review of Systems: no notable symptoms  PHYSICAL EXAMINATION: by video  General appearance - healthy female in no distress  Eyes - pupils normal  Skin - warm and dry  Neuro - nonfocal  Affect - normal    Problem List Items Addressed This Visit          Mental Health    Opioid use disorder on agonist therapy (HCC)    Relevant Medications    buprenorphine -naloxone  (SUBOXONE ) 8-2 MG sublingual film           Today in group we discussed people pleasing. [name] contributed.    Assessment:  Opioid use disorder  Comment: the patient actively participated and shared experiences, interacting appropriately with group without barriers to understanding  Plan:  Continue suboxone  to 3.5 films daily dose divided TID (1.5 films in AM, 1 film mid day, and 1 film at night).  There are days she takes 3 films a day but others where she takes 3.5  films a day if needed for pain control.  Patient is counseled regarding relapse prevention, involvement in recovery groups, and potential side effects of buprenorphine .      (L03.019) Paronychia of finger, unspecified laterality  Comment: reports gest them frequently and does not want to come in for eval. She reports there was some pus but it drained and now there is no pus any more, just swelling and redness. No fevers. She is soaking in warm water, then drying thoroughly and then applying neosporin and not improving  Plan: switch to mupirocin  (BACTROBAN ) 2 % ointment        Come in for eval if worsening or failing to improve    (Z96.651) S/P revision of total knee, right  Comment: refill given for tylenol   Plan: Acetaminophen  Extra Strength 500 MG TABS            Cheryl Burton, MD, 04/05/2024

## 2024-04-05 NOTE — Addendum Note (Signed)
 Addended by: CORETHA STAGGER on: 04/05/2024 12:20 PM     Modules accepted: Orders

## 2024-04-05 NOTE — Progress Notes (Signed)
 Outreach call placed to patient with reminder to come to today's group meeting for suboxone .  Link/number sent that will enable access to group along with instructions for in person attendance.  Chart review complete.    Patient was present at today's suboxone  group.  Actively participated in the instructor lead/prompted group discussion with meaningful content.  States doing well with current dose of suboxone .  Denies any close calls or cravings.  Doing otherwise well on program.

## 2024-04-06 ENCOUNTER — Other Ambulatory Visit: Payer: Self-pay

## 2024-04-06 ENCOUNTER — Ambulatory Visit: Attending: Physician Assistant | Admitting: Rehabilitative and Restorative Service Providers"

## 2024-04-06 DIAGNOSIS — G8929 Other chronic pain: Secondary | ICD-10-CM | POA: Diagnosis present

## 2024-04-06 DIAGNOSIS — M25561 Pain in right knee: Secondary | ICD-10-CM

## 2024-04-06 DIAGNOSIS — Z96651 Presence of right artificial knee joint: Secondary | ICD-10-CM | POA: Insufficient documentation

## 2024-04-06 NOTE — Progress Notes (Signed)
 04/06/24 1000   Precautions   Precautions Orthopedic Precautions   Orthopedic Precautions   Procedure/Injury TKA revision (most recent)   Specific Restrictions do not push flexion   Rehab Discipline   Rehab Discipline PT   Visit   Visit number 6   Recert Due Date  05/06/24   Time Calculation   Start Time 1000   Stop Time 1025   Time Calculation (min) 25 min   Pain   Pain Score 0    Ther Exercise   Exercise recert   Ther Exercise 2   Exercise NMES with QS   Holds 2 10'   Electrical Stimulation   Joints Right;Knee   Position Supine   Time in minutes 10   Parameters NMES with QS   Patient Education   What was taught? continue HEP   Method Verbal;Practice   Patient comprehension Yes       Maurine LITTIE Holt, PT, Lic # [id number]

## 2024-04-06 NOTE — Progress Notes (Signed)
 Outpatient Physical Therapy Recertification 04/06/24  [facility]: [facility]    Patient Name: Cheryl Dixon  DOB: 08/08/1955  Referring Provider: Redgie Chroman, PA-C/John Lonia, MD   Date of Onset: 05/19/23  Diagnosis: Chronic pain of right knee  ICD-10: M25.561, G89/29    Subjective: s/p right TKA 03/11/2023; Open right patella tendon rupture 03/26/2023, s/p poly exchange, irrigation/wound debridement, patellar tendon repair with cerclage cable 03/29/2023, and  most recently s/p revision right TKA to constrained prosthesis, extensor mechanism reconstruction, patellectomy and [facility] 05/19/23  Pain:0/10 currently, 4/10 at worst. Better with rest/most of the time; worse with full extension, bending, nighttime. Pain is described as a throbbing and is located anterior knee. (+) buckling  ADL's: recent fall with tub transfer, does have shower chair and grab bars. Typically independent but not easy. Getting dressed is fine with knee; more difficulty with shoulder pain. Doing all household chores with difficulty.   Recreational Activities: likes to walk on a treadmill, hasn't been able to do it safely.    Contraindications/Precautions: HTN, prediabetes    Objective:   Please Note: Only populated fields were assessed by provider, fields left blank were not assessed. * denotes pain with testing.  Balance: fair+ static SLS R/fair+ static SLS L  Gait: ambulates with non-antalgic gait with RW, 1 episode of buckling  MMT   RIGHT LEFT   Plantarflexion 4/5 4/5   Quad 2+/5 5/5   Hamstrings 5/5 4+/5   Glut max  4+/5 NT/5   Palpation:  TTP: none      Plan of Care    Referring Provider: Chroman Redgie, Fontaine Lonia, MD  Diagnosis: Chronic pain of right knee.    Assessment:   Cheryl Dixon is a 68 year old y/o female who presents today after completing six visits of physical therapy s/p right TKA 03/11/2023; Open right patella tendon rupture 03/26/2023, s/p poly exchange, irrigation/wound debridement, patellar  tendon repair with cerclage cable 03/29/2023, and  most recently s/p revision right TKA to constrained prosthesis, extensor mechanism reconstruction, patellectomy and [facility] 05/19/23. Pt has made continued improvements thus far objectively in strength. However, she does continue to have deficits in strength, particularly of quad. These deficits have continued to impact her ability to perform ADL's, frequent falls. Pt continues to be motivated to improve and compliant with her HEP. She would benefit from continued skilled physical therapy to address her current impairments and allow for return to prior level of function.       GOALS: all progressing   Short Term Functional Goals: 4 weeks.   Patient will demonstrate improved strength to 4/5 throughout BLE's  Patient will be independent with initial HEP    Long Term Goal: 8 weeks.   Patient will demonstrate improved strength to 5/5 throughout so that she will be able to complete all ADL's and household chores with minimal pain  Patient will demonstrate improved knee stability so that she will be able to ascend/descend stairs with step-to pattern, with minimal pain  Patient will be independent with HEP      Treatment Plan:  ** Stretching/ROM/Therapeutic Exercise (97110)  ** Self-Care/Home Management Training (CPT [phone])  ** Neuromuscular Re-education (CPT [id number])  ** Manual Therapy / Joint / Soft tissue Mobilization (CPT 97140)  ** Electrical Stimulation/TENS (CPT 97014)  ** Hot/Cold Rx (CPT 97010)  ** Gait Training (CPT [id number])  ** Functional Activities (CPT 97530)    Recommend skilled physical therapy services for 1 time per week for 6 additional weeks.  Updated plan of care will be completed every 30 days.    Thank you for your referral. Please feel free to contact me with any questions or concerns.    Maurine LITTIE Holt, PT, Lic # [id number]

## 2024-04-08 ENCOUNTER — Telehealth (HOSPITAL_BASED_OUTPATIENT_CLINIC_OR_DEPARTMENT_OTHER): Payer: Self-pay

## 2024-04-08 NOTE — Progress Notes (Signed)
 I certify that the documented Treatment Plan is reasonable and necessary.    04/08/2024  Joy Haegele, PA-C

## 2024-04-08 NOTE — Telephone Encounter (Signed)
 Cheryl Dixon [id number], 68 year old, female Telephone Information:  Home Phone      [phone]  Work Phone      Not on file.  Mobile          [phone]      Calls today: requesting for a callback regarding her suboxone      Person calling on behalf of patient: who is calling: Patient (self)    CALL BACK NUMBER: [phone]   Best time to call back:   Cell phone:   Other phone:    Patient's language of care: English    Patient does not need an interpreter.    Patient's PCP: [name], MD    Primary Care Home Site:

## 2024-04-09 NOTE — BH OP Treatment Plan (Signed)
 Patient/Guardian:  contributed to the creation of the treatment plan.    Strengths/Skills: Help seeking and motivated to get better     Potential Barriers: Multiple life stressors       Patient stated goals: Pt would like to prevent relapses and support her recovery process     Problem 1: Opioid Dependence on Agonist Therapy (Opioid Use Disorder on Agonist Therapy)      Short Term Goals: Pt will use at least one copying skill a day to reduce a risk of relapse, enhance coping with OUD, and support her recovery process     Short Term Target:  60 days  Short TermTarget Date: 06/04/2024  Short Term Goal #1 Progress: progressing     Long Term Goals: Pt will report 50% in urges and cravings reduction and no relapses or close calls       Long Term Target:  90 days  Long TermTarget Date:  07/04/2024   Long Term Goal #1 Progress:  progressing      Intervention #1: PCBHI-based OBOT/Group Psychotherapy   Intervention Frequency:  biweekly  Intervention Duration:  90 Days  Intervention Responsibility:  J. Samiya Mervin, PhD and the OBOT team

## 2024-04-09 NOTE — Progress Notes (Signed)
 OUTPATIENT PSYCHIATRY GROUP PROGRESS NOTE    For the patient participating in the group via telephone and/or video technology: The patient/guardian has verbally consented to participate in this group therapy session by televisit. The patient/guardian was encouraged to be in a private location due to personal health information being discussed and where they could not be overheard by individuals not participating in the group therapy. Although all [facility] staff/providers participating in this group therapy televisit are in a private location, all the risks of telephone and/or video technology used to conduct this group therapy session cannot be controlled by [facility], (i.e. interruptions, unauthorized access/recording and technical difficulties).  Each patient understands that the visit can be stopped at any time for any reason, including if the conferencing connections are inadequate.    Cheryl Dixon is a 68 year old, Divorced, female, who presented for [facility] group visit.   Length of group: 60 min which included 30 min of Health Behavior Intervention and OUD Psychoeducation.    Group Name: OBOT: Office Based Opioid Treatment Group    Group Topic Today: Building Resilience in Recovery    Group Description: This is a supportive, PCBHI team-led recovery group for participants working on maintaining wellness, navigating OUD recovery, and building meaningful lives. Group sessions focus on reflection, connection, and developing practical strategies for personal growth. Members are encouraged to share openly, offer mutual support, and explore themes such as identity, coping skills, relationships, purpose, and resilience. Each week, we introduce a focused discussion topic to guide conversation and foster deeper insight. The group offers a consistent space for members to check in, set intentions, and stay grounded in their recovery goals while learning from one anothers experiences. Today, group members shared and discussed their  recovery experience. Group members explored strategies to reduce relapse risks and increase coping mechanisms to support healthy change.      Leaders:  Estefana Burton, MD and Recardo Grise, Ph.D.    Service Type: [phone] Group Psychotherapy     Number of participants in group today:  12        Purpose of Group (choose all that apply):   Skills training/rehab  Support (psychological, family, community resources, recovery)  Insight and behavior change  Increasing health promoting behaviors  Relapse prevention and enhancing Recovery Process  Education about Opioid Use Disorder (OUD) and treatment  Enhancing adjustment and coping with OUD  Improving management of OUD and treatment adherence  Decreasing health risk behaviors and risks of relapse  Addressing psychological/psychosocial/behavioral barriers to preventing addiction and relapse        Group Process:    Review of group goals/structure  Education about Opioid Use Disorder (OUD) and related topics  Group discussion and sharing.    Review of strategies to increase healthy coping mechanisms to support recovery and change.  Review of additional relapse prevention strategies.   Discussion of support strategies  Check in    Individual Patient Participation (select all that apply):  [x]   Attentive to process  [x]   Shared personal experience related to the group topic.    Subjective report of pt's progress:   Pt reported doing well in recovery.  Reports ongoing physical health issues: leg therapy and frozen shoulder causing pain and functional limitation.  Exploring care at a new hospital/clinic.  Connected socially with another group member over shared history growing up in [place].  Social History update: no major updates reported  Report on relapses and close calls:  No relapse/close calls disclosed.  Medical Necessity of Session (how treatment is necessary to improve symptoms, functioning, or prevent worsening): treatment is necessary to enhance compliance with  agonist treatment and maintain abstinence from opioids. Suboxone  group participation is mandatory to continue Suboxone  prescription and is necessary in order to enhance coping with Opioid Use Disorder.      Relevant Changes in Mental Status: No. No acute issues reported or noted     Risk Level per Scale:  Suicide: re-assessment not indicated today  Violence: re-assessment not indicated today  Addiction: low     Current risk level represents increase in risk: No.     Plan: Pt will continue OBOT treatment      DIAGNOSES:     Opioid dependence on agonist therapy (HCC)  (primary encounter diagnosis)   SNOMED CT(R): OPIOID DEPENDENCE, ON AGONIST THERAPY     MDD (major depressive disorder), recurrent, in partial remission (HCC)   SNOMED CT(R): RECURRENT MAJOR DEPRESSION IN PARTIAL REMISSION      REVIEWING TODAY'S VISIT:   Reviewing Today's Visit:  Clinical Interventions Today: Motivational Interviewing; Cognitive Behavioral Therapy (CBT); Relapse Prevention for SUDs  Patient's Response to Interventions: Responsive  Time Spent in Psychotherapy: 60 min            CARE PLAN/ EPISODES:  Linked Episodes   Type: Episode: Status: Noted: Resolved: Last update: Updated by:   PCBHI PCBHI OBOT  Active 09/11/2020  04/09/2024  9:03 PM [name], PhD      Comments:       Patient/Guardian:  contributed to the creation of the treatment plan.    Strengths/Skills: Help seeking and motivated to get better     Potential Barriers: Multiple life stressors       Patient stated goals: Pt would like to prevent relapses and support her recovery process     Problem 1: Opioid Dependence on Agonist Therapy (Opioid Use Disorder on Agonist Therapy)      Short Term Goals: Pt will use at least one copying skill a day to reduce a risk of relapse, enhance coping with OUD, and support her recovery process     Short Term Target:  60 days  Short TermTarget Date: 06/04/2024  Short Term Goal #1 Progress: progressing     Long Term Goals: Pt will report 50%  in urges and cravings reduction and no relapses or close calls       Long Term Target:  90 days  Long TermTarget Date:  07/04/2024   Long Term Goal #1 Progress:  progressing      Intervention #1: PCBHI-based OBOT/Group Psychotherapy   Intervention Frequency:  biweekly  Intervention Duration:  90 Days  Intervention Responsibility:  J. Amitai Delaughter, PhD and the OBOT team  Bahja Bence, PhD

## 2024-04-11 ENCOUNTER — Other Ambulatory Visit: Payer: Self-pay

## 2024-04-11 ENCOUNTER — Ambulatory Visit: Attending: Orthopaedic Surgery | Admitting: Orthopaedic Surgery

## 2024-04-11 DIAGNOSIS — M19012 Primary osteoarthritis, left shoulder: Secondary | ICD-10-CM

## 2024-04-11 NOTE — Progress Notes (Signed)
 Date of service: 04/11/2024    This is a 68 year old year old female who returns for follow up of her bilateral shoulder pain..  She reports that her right shoulder symptoms have not been that bad but unfortunately she continues to endorse significant left shoulder pain.  She notes difficulty with lifting and reaching.  She has been feeling a lot of muscle spasms.  She has had a few falls since I last saw her and was seen in the emergency room few months ago with imaging of the left shoulder.  She continues to follow-up with Dr. Lonia from her revision right knee replacement surgeries.        Physical examination:   General - the patient is alert and oriented x3, well appearing, in no acute distress.  Normal affect and mood.  Left shoulder active elevation 30, passively 50 due to pain and guarding. Further examination limited due to pain. Strength 5 out of 5 distally. Neurovascular intact        Assessement: Left shoulder glenohumeral arthritis.    Plan: I reviewed the findings with the patient and her daughter.  She continues to have a significant left shoulder pain which certainly correlates with the degree of her severe arthritis.  She may also have some chronic rotator cuff tearing.  We did again discuss treatment options and she does not have interest in pursuing total shoulder arthroplasty at this time.  She is also unsure if she wants to try a corticosteroid injection again.  I have placed an order for interventional radiology and she can schedule it if she desires at some point.  I encouraged her to follow-up with her primary care provider for pain management otherwise.  She may follow-up with me as needed if at some point she wishes to discuss joint replacement surgery.  All questions were answered.    This note was prepared using voice recognition software.  Please disregard any transcription errors.

## 2024-04-11 NOTE — Patient Instructions (Addendum)
 If you want to schedule the shoulder injection in interventional radiology, you can call [phone].

## 2024-04-12 ENCOUNTER — Encounter (HOSPITAL_BASED_OUTPATIENT_CLINIC_OR_DEPARTMENT_OTHER): Payer: Self-pay | Admitting: Physician Assistant

## 2024-04-12 ENCOUNTER — Ambulatory Visit: Admitting: Physician Assistant

## 2024-04-12 ENCOUNTER — Ambulatory Visit: Admitting: Clinical

## 2024-04-12 DIAGNOSIS — F1120 Opioid dependence, uncomplicated: Secondary | ICD-10-CM

## 2024-04-12 DIAGNOSIS — I6330 Cerebral infarction due to thrombosis of unspecified cerebral artery: Secondary | ICD-10-CM | POA: Diagnosis not present

## 2024-04-12 DIAGNOSIS — G8929 Other chronic pain: Secondary | ICD-10-CM | POA: Diagnosis not present

## 2024-04-12 DIAGNOSIS — E66812 Obesity, class 2: Secondary | ICD-10-CM

## 2024-04-12 DIAGNOSIS — Z6832 Body mass index (BMI) 32.0-32.9, adult: Secondary | ICD-10-CM

## 2024-04-12 DIAGNOSIS — I10 Essential (primary) hypertension: Secondary | ICD-10-CM

## 2024-04-12 DIAGNOSIS — Z5321 Procedure and treatment not carried out due to patient leaving prior to being seen by health care provider: Secondary | ICD-10-CM

## 2024-04-12 MED ORDER — LIDOCAINE 5 % EX PTCH: 1.0000 | MEDICATED_PATCH | CUTANEOUS | 0 refills | Status: AC
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
## 2024-04-13 ENCOUNTER — Telehealth (HOSPITAL_BASED_OUTPATIENT_CLINIC_OR_DEPARTMENT_OTHER): Payer: Self-pay | Admitting: Registered Nurse

## 2024-04-13 MED ORDER — SUBOXONE 8-2 MG SL FILM: ORAL_FILM | SUBLINGUAL | 0 refills | Status: DC

## 2024-04-13 NOTE — Telephone Encounter (Signed)
 Cheryl Dixon -- please let her know I submitted new prescription and PA for brand name.  In the meantime would recommend that she stop taking extra or she will run out.

## 2024-04-13 NOTE — Progress Notes (Signed)
 Pt briefly came to the group and stated she had a conflict with another appointment; will talk to Dr. SHAUNNA after the group.

## 2024-04-13 NOTE — Telephone Encounter (Signed)
 Pt calls to see if she can have Rx for Brand name suboxone   States this has happened to her before, insurance will only cover the generic med and she feels the generic is different and does not work as well as brand name  She has been taking 4 tabs instead of 2   Thought she was sick at first but actually thinks its more of withdrawal sx due to the med being ineffective for her  Advised would send message to prescriber

## 2024-04-14 NOTE — Telephone Encounter (Signed)
 Incoming call from patient to NAL;  Name and DOB confirmed.    Patient states she left a message for Arnulfo regarding concerns about her medication/ plan of care- reports she is concerned as right now she has enough medication to last her until Tuesday or Wednesday, but is anxious about having side effects due to insufficient dosing. Advised patient message would be forwarded to care team to follow up.

## 2024-04-18 ENCOUNTER — Other Ambulatory Visit (HOSPITAL_BASED_OUTPATIENT_CLINIC_OR_DEPARTMENT_OTHER): Payer: Self-pay

## 2024-04-18 ENCOUNTER — Other Ambulatory Visit (HOSPITAL_BASED_OUTPATIENT_CLINIC_OR_DEPARTMENT_OTHER): Payer: Self-pay | Admitting: Internal Medicine

## 2024-04-18 DIAGNOSIS — G245 Blepharospasm: Secondary | ICD-10-CM

## 2024-04-18 DIAGNOSIS — Z96651 Presence of right artificial knee joint: Secondary | ICD-10-CM

## 2024-04-18 NOTE — Telephone Encounter (Signed)
 Payton Moder [id number], 68 year old, female   Telephone Information:   Home Phone [phone]   Work Phone Not on file.   Mobile [phone]       Calls today: Other: Patient is requesting a refill of the medication below. She says the pharmacy told her its too soon to for another refill.  Patient has been made ware her October refill was less than 90 days ago.     magnesium  oxide (MAG-OX) 400 (240 Mg) MG tablet [[id number]]     Person calling on behalf of patient: who is calling: Patient (self)    CALL BACK NUMBER: [phone]   Best time to call back: ANYTIME  Cell phone:   Other phone:    Patient's language of care: English    Patient does not need an interpreter.    Patient's PCP: [name], MD    Primary Care Home Site:  [facility]

## 2024-04-18 NOTE — Telephone Encounter (Signed)
 PER who is calling: Pharmacy, Cheryl Dixon is a 68 year old female has requested a refill of         celecoxib  (CELEBREX ) 200 MG capsule .          Last Office Visit  : 01/18/2024 [name], MD      Last Tele Visit:  04/12/2024 [name]      Last Physical Exam:  05/27/2013    There are no preventive care reminders to display for this patient.    Other Med Adult:  Most Recent BP Reading(s)  02/06/24 : 163/100        Cholesterol (mg/dL)   Date Value   95/75/7975 212     LOW DENSITY LIPOPROTEIN DIRECT (mg/dL)   Date Value   95/75/7975 151     HIGH DENSITY LIPOPROTEIN (mg/dL)   Date Value   95/75/7975 47     TRIGLYCERIDES (mg/dL)   Date Value   91/82/7976 119         THYROID  SCREEN TSH REFLEX FT4 (uIU/mL)   Date Value   08/20/2022 4.100         No results found for: TSH    HEMOGLOBIN A1C (%)   Date Value   08/20/2022 5.6       No results found for: POCA1C      INR (no units)   Date Value   12/12/2021 1.1   02/16/2007 1.0 (L)   07/03/2006 < 1.0 (L)       SODIUM (mmol/L)   Date Value   01/18/2024 140       POTASSIUM (mmol/L)   Date Value   01/18/2024 4.0           CREATININE (mg/dL)   Date Value   90/77/7974 0.9       Documented patient preferred pharmacies:    [id number] - [place], [place] - [address]  Phone: [phone] Fax: [phone]

## 2024-04-19 ENCOUNTER — Other Ambulatory Visit (HOSPITAL_BASED_OUTPATIENT_CLINIC_OR_DEPARTMENT_OTHER): Payer: Self-pay | Admitting: Internal Medicine

## 2024-04-19 MED ORDER — DICLOFENAC SODIUM 1 % EX GEL: 4.0000 g | Freq: Four times a day (QID) | CUTANEOUS | 2 refills | Status: DC

## 2024-04-19 NOTE — Telephone Encounter (Signed)
 PER who is calling: Patient (self), Cheryl Dixon is a 68 year old female has requested a refill of       voltaren .        Only complete for controlled medication:    Previously prescribed:  Start Date          End Date              Last Office Visit  : 01/18/2024 [name], MD      Last Tele Visit:  04/12/2024 [name], PA-C      Last Physical Exam:  05/27/2013    There are no preventive care reminders to display for this patient.    Other Med Adult:  Most Recent BP Reading(s)  02/06/24 : 163/100        Cholesterol (mg/dL)   Date Value   95/75/7975 212     LOW DENSITY LIPOPROTEIN DIRECT (mg/dL)   Date Value   95/75/7975 151     HIGH DENSITY LIPOPROTEIN (mg/dL)   Date Value   95/75/7975 47     TRIGLYCERIDES (mg/dL)   Date Value   91/82/7976 119         THYROID  SCREEN TSH REFLEX FT4 (uIU/mL)   Date Value   08/20/2022 4.100         No results found for: TSH    HEMOGLOBIN A1C (%)   Date Value   08/20/2022 5.6       No results found for: POCA1C      INR (no units)   Date Value   12/12/2021 1.1   02/16/2007 1.0 (L)   07/03/2006 < 1.0 (L)       SODIUM (mmol/L)   Date Value   01/18/2024 140       POTASSIUM (mmol/L)   Date Value   01/18/2024 4.0           CREATININE (mg/dL)   Date Value   90/77/7974 0.9       Documented patient preferred pharmacies:    [id number] - [place], [place] - [address]  Phone: [phone] Fax: [phone]

## 2024-04-20 MED ORDER — MAGNESIUM OXIDE -MG SUPPLEMENT 400 (240 MG) MG PO TABS: 400.0000 mg | ORAL_TABLET | Freq: Every day | ORAL | 3 refills | Status: AC

## 2024-04-20 NOTE — Prior Authorization (Signed)
 PRIOR AUTHORIZATION EXPIRES ON 06/03/24    NEXT AVAILABLE FILL DATE [id number]  LAST FILL DT [id number]   [id number]  ,PHONE #[phone](PHARMACY HELP DESK [phone])NON-SPECIALTY [id number]

## 2024-04-20 NOTE — Telephone Encounter (Signed)
 PER who is calling: Pharmacy, Cheryl Dixon is a 68 year old female has requested a refill of       magnesium  oxide (MAG-OX) .            Last Office Visit  : 01/18/2024 [name], MD      Last Tele Visit:  04/12/2024 [name]      Last Physical Exam:  05/27/2013    There are no preventive care reminders to display for this patient.    Other Med Adult:  Most Recent BP Reading(s)  02/06/24 : 163/100        Cholesterol (mg/dL)   Date Value   95/75/7975 212     LOW DENSITY LIPOPROTEIN DIRECT (mg/dL)   Date Value   95/75/7975 151     HIGH DENSITY LIPOPROTEIN (mg/dL)   Date Value   95/75/7975 47     TRIGLYCERIDES (mg/dL)   Date Value   91/82/7976 119         THYROID  SCREEN TSH REFLEX FT4 (uIU/mL)   Date Value   08/20/2022 4.100         No results found for: TSH    HEMOGLOBIN A1C (%)   Date Value   08/20/2022 5.6       No results found for: POCA1C      INR (no units)   Date Value   12/12/2021 1.1   02/16/2007 1.0 (L)   07/03/2006 < 1.0 (L)       SODIUM (mmol/L)   Date Value   01/18/2024 140       POTASSIUM (mmol/L)   Date Value   01/18/2024 4.0           CREATININE (mg/dL)   Date Value   90/77/7974 0.9       Documented patient preferred pharmacies:    [id number] - [place], [place] - [address]  Phone: [phone] Fax: [phone]

## 2024-04-25 ENCOUNTER — Encounter (HOSPITAL_BASED_OUTPATIENT_CLINIC_OR_DEPARTMENT_OTHER): Payer: Self-pay | Admitting: Physician Assistant

## 2024-04-27 ENCOUNTER — Other Ambulatory Visit (HOSPITAL_BASED_OUTPATIENT_CLINIC_OR_DEPARTMENT_OTHER): Payer: Self-pay | Admitting: Pediatrics

## 2024-04-27 ENCOUNTER — Other Ambulatory Visit (HOSPITAL_BASED_OUTPATIENT_CLINIC_OR_DEPARTMENT_OTHER): Payer: Self-pay | Admitting: Internal Medicine

## 2024-04-27 ENCOUNTER — Ambulatory Visit

## 2024-04-27 DIAGNOSIS — F419 Anxiety disorder, unspecified: Secondary | ICD-10-CM

## 2024-04-27 MED ORDER — SUBOXONE 8-2 MG SL FILM: ORAL_FILM | SUBLINGUAL | 0 refills | Status: DC

## 2024-04-27 MED ORDER — CLONAZEPAM 0.5 MG PO TABS: ORAL_TABLET | ORAL | 1 refills | Status: DC

## 2024-04-27 NOTE — Telephone Encounter (Signed)
 PER who is calling: Patient (self), Cheryl Dixon is a 68 year old female has requested a refill of       clonazePAM  (KLONOPIN ) 0.5 MG tablet .      Last Office Visit  : 01/18/2024 [name], MD      Last Tele Visit:  04/12/2024 [name]      Last Physical Exam:  05/27/2013    There are no preventive care reminders to display for this patient.    Other Med Adult:  Most Recent BP Reading(s)  02/06/24 : 163/100        Cholesterol (mg/dL)   Date Value   95/75/7975 212     LOW DENSITY LIPOPROTEIN DIRECT (mg/dL)   Date Value   95/75/7975 151     HIGH DENSITY LIPOPROTEIN (mg/dL)   Date Value   95/75/7975 47     TRIGLYCERIDES (mg/dL)   Date Value   91/82/7976 119         THYROID  SCREEN TSH REFLEX FT4 (uIU/mL)   Date Value   08/20/2022 4.100         No results found for: TSH    HEMOGLOBIN A1C (%)   Date Value   08/20/2022 5.6       No results found for: POCA1C      INR (no units)   Date Value   12/12/2021 1.1   02/16/2007 1.0 (L)   07/03/2006 < 1.0 (L)       SODIUM (mmol/L)   Date Value   01/18/2024 140       POTASSIUM (mmol/L)   Date Value   01/18/2024 4.0           CREATININE (mg/dL)   Date Value   90/77/7974 0.9       Documented patient preferred pharmacies:    [id number] - [place], [place] - [address]  Phone: [phone] Fax: [phone]

## 2024-05-02 ENCOUNTER — Ambulatory Visit: Admission: RE | Admit: 2024-05-02 | Source: Ambulatory Visit

## 2024-05-03 ENCOUNTER — Encounter (HOSPITAL_BASED_OUTPATIENT_CLINIC_OR_DEPARTMENT_OTHER): Payer: Self-pay

## 2024-05-04 ENCOUNTER — Ambulatory Visit (HOSPITAL_BASED_OUTPATIENT_CLINIC_OR_DEPARTMENT_OTHER)

## 2024-05-07 ENCOUNTER — Other Ambulatory Visit (HOSPITAL_BASED_OUTPATIENT_CLINIC_OR_DEPARTMENT_OTHER): Payer: Self-pay | Admitting: Internal Medicine

## 2024-05-07 NOTE — Telephone Encounter (Signed)
 PER who is calling: Pharmacy, Tysheka Fanguy is a 69 year old female has requested a refill of       docusate.        Only complete for controlled medication:    Previously prescribed:  Start Date          End Date         Last Office Visit  : 01/18/2024 [name], MD      Last Tele Visit:  04/12/2024 [name], PA-C      Last Physical Exam:  05/27/2013    There are no preventive care reminders to display for this patient.    Other Med Adult:  Most Recent BP Reading(s)  02/06/24 : 163/100        Cholesterol (mg/dL)   Date Value   95/75/7975 212     LOW DENSITY LIPOPROTEIN DIRECT (mg/dL)   Date Value   95/75/7975 151     HIGH DENSITY LIPOPROTEIN (mg/dL)   Date Value   95/75/7975 47     TRIGLYCERIDES (mg/dL)   Date Value   91/82/7976 119         THYROID  SCREEN TSH REFLEX FT4 (uIU/mL)   Date Value   08/20/2022 4.100         No results found for: TSH    HEMOGLOBIN A1C (%)   Date Value   08/20/2022 5.6       No results found for: POCA1C      INR (no units)   Date Value   12/12/2021 1.1   02/16/2007 1.0 (L)   07/03/2006 < 1.0 (L)       SODIUM (mmol/L)   Date Value   01/18/2024 140       POTASSIUM (mmol/L)   Date Value   01/18/2024 4.0           CREATININE (mg/dL)   Date Value   90/77/7974 0.9       Documented patient preferred pharmacies:    [id number] - [place], [place] - [address]  Phone: [phone] Fax: [phone]

## 2024-05-10 ENCOUNTER — Ambulatory Visit: Attending: Clinical | Admitting: Clinical

## 2024-05-10 ENCOUNTER — Ambulatory Visit (HOSPITAL_BASED_OUTPATIENT_CLINIC_OR_DEPARTMENT_OTHER): Payer: Self-pay

## 2024-05-10 DIAGNOSIS — F1120 Opioid dependence, uncomplicated: Secondary | ICD-10-CM | POA: Insufficient documentation

## 2024-05-10 DIAGNOSIS — F3341 Major depressive disorder, recurrent, in partial remission: Secondary | ICD-10-CM | POA: Diagnosis present

## 2024-05-10 NOTE — Telephone Encounter (Signed)
 Adult General Triage    I spoke with Patient:     Chief Complaint: frozen shoulder, knee pain      Current Day of Symptoms: months      Patient Active Problem List:     Opioid use disorder on agonist therapy (HCC)     Tobacco use disorder     Fibrocystic breast     Essential hypertension     Knee pain     Chronic low back pain     OA (osteoarthritis) of knee     H. pylori infection     Major depressive disorder, recurrent episode, severe (HCC)     Occult blood in stools     Prediabetes     Epigastric pain     Chronic obstructive pulmonary disease (HCC)     Left foot pain     Multiple polyps of colon     Acquired deformity of toe, right     Dislocation of metatarsophalangeal joint of lesser toe, right, sequela     Postmenopausal atrophic vaginitis     Abnormal loss of weight     Aneurysm of ascending aorta without rupture (HCC)     COVID-19 virus infection     Obesity, Class II, BMI 35-39.9, isolated     Chronic dysuria     LLQ pain     Osteoporosis     History of endometriosis     Lung cancer screening     Constipation, drug induced     Weakness     Cerebrovascular accident (CVA) (HCC)     Overactive bladder      [facility] [phone] Option #4     Substance addiction (HCC)     Anxiety     Obese     S/P total knee arthroplasty, right     Status post total right knee replacement     Patellar tendon rupture, right, sequela     S/P revision of total knee, right     Gait instability     Pain of right lower extremity      Review of Patient's Allergies indicates:   Darvon                     Meperidine hcl              Comment:Nausea/vomit   Paxil [paroxetine]      Rash   Zoloft  [sertraline ]     Rash    Emergency care:     Loss of Consciousness:  No     Chest Pain:  No     Severe Difficulty Breathing:  No     Concern for life threatening condition:  No     The patient has the following symptoms:     Patient calling c/o persisting knee pain since a fall in October 2025. Per pt mobility seems no  better since the fall. Has been working with outpatient PT, its difficult to get into the visit between mobility and other life obligations. She also is deal with bilateral frozen shoulder.        Home Remedies:  PT, injection of shoulder      Advised per nursing triage protocol.         Recommended disposition for patient:  Disposition: Scheduled Televisit Appointment     If patient referred to UC/ED advised that they may require further follow up and testing after the visit with their primary care office.  Instructed patient to call back for any new, worsening, or worrisome symptoms or concerns any time day or night.    Advised per nursing triage protocol.     Telephone Call Outcome:  Single Call Resolution        Reason for Disposition   Knee pain is a chronic symptom (recurrent or ongoing AND lasting > 4 weeks)    Protocols used: Knee Pain-A-[place]

## 2024-05-11 ENCOUNTER — Ambulatory Visit: Admitting: Rehabilitative and Restorative Service Providers"

## 2024-05-11 ENCOUNTER — Encounter (HOSPITAL_BASED_OUTPATIENT_CLINIC_OR_DEPARTMENT_OTHER): Payer: Self-pay | Admitting: Rehabilitative and Restorative Service Providers"

## 2024-05-11 DIAGNOSIS — M25561 Pain in right knee: Secondary | ICD-10-CM

## 2024-05-11 NOTE — Progress Notes (Signed)
 Outpatient Physical Therapy Discharge  [facility]    Patient Name: Cheryl Dixon  DOB: September 30, 1955  Referring Provider: Leahey, Abigail, PA-C/John Lonia, MD   Date of Onset: 05/19/23  Diagnosis: Chronic pain of right knee  ICD-10: M25.561, G89/29    Your patient, [name], has been discharged from Physical Therapy after a total of 6 visits.    Reasons for Discharge: This patient has: no-showed her last three appts in a way.    Recommendations: This patient should: follow up with MD    Thank you again for your referral. Please feel free to contact me with any questions or concerns.    Maurine LITTIE Holt, PT, Lic # [id number]

## 2024-05-13 ENCOUNTER — Other Ambulatory Visit (HOSPITAL_BASED_OUTPATIENT_CLINIC_OR_DEPARTMENT_OTHER): Payer: Self-pay | Admitting: Internal Medicine

## 2024-05-13 DIAGNOSIS — F419 Anxiety disorder, unspecified: Secondary | ICD-10-CM

## 2024-05-13 NOTE — Telephone Encounter (Signed)
 PER who is calling: Pharmacy, Cheryl Dixon is a 69 year old female has requested a refill of      Clonazepam .        Only complete for controlled medication:    Previously prescribed:    Start Date 04/27/2024          End Date    05/20/2024         Last Office Visit  : 01/18/2024 [name], MD      Last Tele Visit:  04/12/2024 [name], PA-C      Last Physical Exam:  05/27/2013    There are no preventive care reminders to display for this patient.    Other Med Adult:  Most Recent BP Reading(s)  02/06/24 : 163/100        Cholesterol (mg/dL)   Date Value   95/75/7975 212     LOW DENSITY LIPOPROTEIN DIRECT (mg/dL)   Date Value   95/75/7975 151     HIGH DENSITY LIPOPROTEIN (mg/dL)   Date Value   95/75/7975 47     TRIGLYCERIDES (mg/dL)   Date Value   91/82/7976 119         THYROID  SCREEN TSH REFLEX FT4 (uIU/mL)   Date Value   08/20/2022 4.100         No results found for: TSH    HEMOGLOBIN A1C (%)   Date Value   08/20/2022 5.6       No results found for: POCA1C      INR (no units)   Date Value   12/12/2021 1.1   02/16/2007 1.0 (L)   07/03/2006 < 1.0 (L)       SODIUM (mmol/L)   Date Value   01/18/2024 140       POTASSIUM (mmol/L)   Date Value   01/18/2024 4.0           CREATININE (mg/dL)   Date Value   90/77/7974 0.9       Documented patient preferred pharmacies:    [id number] - [place],  - [address]  Phone: [phone] Fax: [phone]

## 2024-05-13 NOTE — Telephone Encounter (Signed)
 Refill requested too soon, should have 1 refill remaining

## 2024-05-14 NOTE — Progress Notes (Signed)
 OUTPATIENT PSYCHIATRY GROUP PROGRESS NOTE    For the patient participating in the group via telephone and/or video technology: The patient/guardian has verbally consented to participate in this group therapy session by televisit. The patient/guardian was encouraged to be in a private location due to personal health information being discussed and where they could not be overheard by individuals not participating in the group therapy. Although all [facility] staff/providers participating in this group therapy televisit are in a private location, all the risks of telephone and/or video technology used to conduct this group therapy session cannot be controlled by [facility], (i.e. interruptions, unauthorized access/recording and technical difficulties).  Each patient understands that the visit can be stopped at any time for any reason, including if the conferencing connections are inadequate.    Cheryl Dixon is a 69 year old, Divorced, female, who presented for [facility] group visit.   Length of group: 60 min which included 30 min of Health Behavior Intervention and OUD Psychoeducation.    Group Name: OBOT: Office Based Opioid Treatment Group    Group Topic Today: How to Rebuild Self-Trust    Group Description: This is a supportive, PCBHI team-led recovery group for participants working on maintaining wellness, navigating OUD recovery, and building meaningful lives. Group sessions focus on reflection, connection, and developing practical strategies for personal growth. Members are encouraged to share openly, offer mutual support, and explore themes such as identity, coping skills, relationships, purpose, and resilience. Each week, we introduce a focused discussion topic to guide conversation and foster deeper insight. The group offers a consistent space for members to check in, set intentions, and stay grounded in their recovery goals while learning from one anothers experiences. Today, group members shared and discussed their recovery  experience. Group members explored strategies to reduce relapse risks and increase coping mechanisms to support healthy change.      Leaders: Estefana Burton, MD, Recardo Grise, Ph.D. and Arnulfo Ohara, RN    Service Type: [phone] Group Psychotherapy     Number of participants in group today:  12        Purpose of Group (choose all that apply):   Skills training/rehab  Support (psychological, family, community resources, recovery)  Insight and behavior change  Increasing health promoting behaviors  Relapse prevention and enhancing Recovery Process  Education about Opioid Use Disorder (OUD) and treatment  Enhancing adjustment and coping with OUD  Improving management of OUD and treatment adherence  Decreasing health risk behaviors and risks of relapse  Addressing psychological/psychosocial/behavioral barriers to preventing addiction and relapse        Group Process:    Review of group goals/structure  Education about Opioid Use Disorder (OUD) and related topics  Group discussion and sharing.    Review of strategies to increase healthy coping mechanisms to support recovery and change.  Review of additional relapse prevention strategies.   Discussion of support strategies  Check in    Individual Patient Participation (select all that apply):  [x]   Attentive to process  [x]   Contributed to group discussion  [x]   Shared personal experience related to the group topic.    Subjective report of pt's progress:   Pt participated in group discussion.  Pt reported continuous struggle with frozen shoulders.  Continues PT.  Recovery is stable.  Social History update: no major updates reported  Report on relapses and close calls:  No relapse/close calls disclosed.     Medical Necessity of Session (how treatment is necessary to improve symptoms, functioning, or prevent  worsening): treatment is necessary to enhance compliance with agonist treatment and maintain abstinence from opioids. Suboxone  group participation is mandatory to  continue Suboxone  prescription and is necessary in order to enhance coping with Opioid Use Disorder.      Relevant Changes in Mental Status: No. No acute issues reported or noted     Risk Level per Scale:  Suicide: re-assessment not indicated today  Violence: re-assessment not indicated today  Addiction: low     Current risk level represents increase in risk: No.     Plan: Pt will continue OBOT treatment     DIAGNOSES:     Opioid dependence on agonist therapy (HCC)  (primary encounter diagnosis)   SNOMED CT(R): OPIOID DEPENDENCE, ON AGONIST THERAPY     MDD (major depressive disorder), recurrent, in partial remission (HCC)   SNOMED CT(R): RECURRENT MAJOR DEPRESSION IN PARTIAL REMISSION      REVIEWING TODAY'S VISIT:   Reviewing Today's Visit:  Clinical Interventions Today: Motivational Interviewing; Cognitive Behavioral Therapy (CBT); Relapse Prevention for SUDs  Patient's Response to Interventions: Responsive  Time Spent in Psychotherapy: 60 min            CARE PLAN/ EPISODES:  Linked Episodes   Type: Episode: Status: Noted: Resolved: Last update: Updated by:   PCBHI PCBHI OBOT  Active 09/11/2020  05/10/2024 12:05 PM [name], PhD      Comments:       Patient/Guardian:       Strengths/Skills: Help seeking and motivated to get better     Potential Barriers: Multiple life stressors       Patient stated goals: Pt would like to prevent relapses and support her recovery process     Problem 1: Opioid Dependence on Agonist Therapy (Opioid Use Disorder on Agonist Therapy)      Short Term Goals: Pt will use at least one copying skill a day to reduce a risk of relapse, enhance coping with OUD, and support her recovery process     Short Term Target:  60 days  Short TermTarget Date: 06/04/2024  Short Term Goal #1 Progress:       Long Term Goals: Pt will report 50% in urges and cravings reduction and no relapses or close calls       Long Term Target:  90 days  Long TermTarget Date:  07/04/2024   Long Term Goal #1 Progress:          Intervention #1: PCBHI-based OBOT/Group Psychotherapy   Intervention Frequency:  biweekly  Intervention Duration:  90 Days  Intervention Responsibility:  J. Mikena Masoner, PhD and the OBOT team  Manvir Prabhu, PhD

## 2024-05-17 ENCOUNTER — Ambulatory Visit: Admitting: Clinical

## 2024-05-17 ENCOUNTER — Encounter (HOSPITAL_BASED_OUTPATIENT_CLINIC_OR_DEPARTMENT_OTHER): Payer: Self-pay | Admitting: Physician Assistant

## 2024-05-17 ENCOUNTER — Other Ambulatory Visit (HOSPITAL_BASED_OUTPATIENT_CLINIC_OR_DEPARTMENT_OTHER): Payer: Self-pay | Admitting: Family Medicine

## 2024-05-17 ENCOUNTER — Ambulatory Visit: Admitting: Family Medicine

## 2024-05-17 ENCOUNTER — Ambulatory Visit: Admitting: Physician Assistant

## 2024-05-17 DIAGNOSIS — E041 Nontoxic single thyroid nodule: Secondary | ICD-10-CM | POA: Diagnosis not present

## 2024-05-17 DIAGNOSIS — K635 Polyp of colon: Secondary | ICD-10-CM

## 2024-05-17 DIAGNOSIS — Z1211 Encounter for screening for malignant neoplasm of colon: Secondary | ICD-10-CM

## 2024-05-17 DIAGNOSIS — M1711 Unilateral primary osteoarthritis, right knee: Secondary | ICD-10-CM

## 2024-05-17 DIAGNOSIS — E66812 Obesity, class 2: Secondary | ICD-10-CM

## 2024-05-17 DIAGNOSIS — R1310 Dysphagia, unspecified: Secondary | ICD-10-CM | POA: Insufficient documentation

## 2024-05-17 MED ORDER — SUBOXONE 8-2 MG SL FILM: ORAL_FILM | SUBLINGUAL | 0 refills | Status: AC

## 2024-05-17 NOTE — Assessment & Plan Note (Signed)
 Denies gerd sx but does have history of this. Dysphagia is only with solid food if doesn't chew well, does NOT want to try ppi/h2 blocker at this time  Agreeable to EGD to better evaluate  Has us  thyroid  overdue will book as well

## 2024-05-17 NOTE — Assessment & Plan Note (Signed)
 Us  ordered but pt has not yet completed (missed last appt) - info given to r/s and discussed importance of this follow up

## 2024-05-17 NOTE — Assessment & Plan Note (Signed)
 Last colonoscopy 03/2022 recommended 2 year repeat d/t incomplete prep- agreeable to referral now
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
## 2024-05-17 NOTE — Assessment & Plan Note (Signed)
 Interested in medication as cannot exercise well dt multiple joint pains  However has overdue thyroid  us  for nodule- rec to complete this first and then f/u to discuss as long as nodule not concerning    F/u 1 month

## 2024-05-18 ENCOUNTER — Encounter (HOSPITAL_BASED_OUTPATIENT_CLINIC_OR_DEPARTMENT_OTHER): Payer: Self-pay

## 2024-05-18 NOTE — Progress Notes (Signed)
 Charolet Clarita HERO., RN  [name]; Case, Adrien, RN; Floretta Macintosh, RN; Carole Hough, Tylene, RN  We received your GI procedure order for your patient, Cheryl Dixon.  However, there is a pending order for a echocardiogram      Please provide us  with clearance when it is safe to proceed with the GI procedure.  Once we receive your confirmation, we will schedule the GI procedure.      Thank you,  Clarita HERO Charolet, RN

## 2024-05-18 NOTE — Endoscopy Dept Note (Signed)
 RN Assessment Note  Charolet Clarita HERO., RN  [name]; Case, Adrien, RN; Floretta Macintosh, RN; Carole Hough, Tylene, RN  We received your GI procedure order for your patient, Cheryl Dixon.  However, there is a pending order for a echocardiogram      Please provide us  with clearance when it is safe to proceed with the GI procedure.  Once we receive your confirmation, we will schedule the GI procedure.      Thank you,  Clarita HERO Charolet, RN      Patient Name: Cheryl Dixon  DOB:  1955-09-04   MRN:  [id number]      CLINICAL QUESTIONS:    Height/Weight/BMI:    There is no height or weight on file to calculate BMI.   Is the patient on an ANTIPLATELET agent?  None  Provider Recommendation:         Is the patient on an ANTICOAGULANT?  None  Provider Recommendation:                 Need for Pre-Procedure GI Appointment (for unstable medical condition):      Pending Cardiology Studies:  Yes  Pending echo    Patient with Pending Cardiac Echo:     Order on HOLD Non-Cardiac Reason:    Colonoscopy Two-Day Prep Needed:     Additional Pertinent Information:  last colonoscopy poor prep 03/2022    RN Clinical Comments:  message sent to ordering provider  also looks like outside order for eliquis ?    Problem List:    Patient Active Problem List:     Opioid use disorder on agonist therapy (HCC)     Tobacco use disorder     Fibrocystic breast     Essential hypertension     Knee pain     Chronic low back pain     OA (osteoarthritis) of knee     H. pylori infection     Major depressive disorder, recurrent episode, severe (HCC)     Occult blood in stools     Prediabetes     Epigastric pain     Chronic obstructive pulmonary disease (HCC)     Left foot pain     Multiple polyps of colon     Acquired deformity of toe, right     Dislocation of metatarsophalangeal joint of lesser toe, right, sequela     Postmenopausal atrophic vaginitis     Abnormal loss of weight     Aneurysm of ascending aorta without rupture (HCC)     COVID-19 virus  infection     Obesity, Class II, BMI 35-39.9, isolated     Chronic dysuria     LLQ pain     Osteoporosis     History of endometriosis     Lung cancer screening     Constipation, drug induced     Weakness     Cerebrovascular accident (CVA) (HCC)     Overactive bladder      [facility] [phone] Option #4     Substance addiction (HCC)     Anxiety     S/P total knee arthroplasty, right     Status post total right knee replacement     Patellar tendon rupture, right, sequela     S/P revision of total knee, right     Gait instability     Pain of right lower extremity     Dysphagia     Thyroid  nodule  Surgical History:    Past Surgical History:  2023: COLONOSCOPY  No date: ENDOMETRIAL ABLTJ THERMAL W/O HYSTEROSCOPIC GID  No date: PR ANES HRNA REPAIR UPR ABD TABDL RPR DIPHRG HRNA  No date: PR ANES IPER LOWER ABD W/LAPS RAD HYSTERECTOMY  No date: RPR 1ST INGUN HRNA PRETERM INFT [facility]     Outpatient Medications:    SUBOXONE  8-2 MG sublingual film, Take 1.5 film under the tongue in the morning, 1 film in the afternoon, and 1 film in the evening, Disp: 98 each, Rfl: 0  docusate sodium  (COLACE) 100 MG capsule, TAKE 1 CAPSULE BY MOUTH IN THE MORNING AND BEFORE BEDTIME, Disp: 180 capsule, Rfl: 3  clonazePAM  (KLONOPIN ) 0.5 MG tablet, TAKE 1 TABLET BY MOUTH 2 TIMES DAILY AS NEEDED FOR ANXIETY., Disp: 28 tablet, Rfl: 1  magnesium  oxide (MAG-OX) 400 (240 Mg) MG tablet, Take 1 tablet by mouth daily, Disp: 90 tablet, Rfl: 3  diclofenac  (VOLTAREN ) 1 % GEL Gel, Apply 4 g topically in the morning and 4 g at noon and 4 g in the evening and 4 g before bedtime. (4 tubes per month, up to 16 grams per day)., Disp: 400 g, Rfl: 2  celecoxib  (CELEBREX ) 200 MG capsule, TAKE 1 CAPSULE BY MOUTH IN THE MORNING AND BEFORE BEDTIME, Disp: 60 capsule, Rfl: 1  Acetaminophen  Extra Strength 500 MG TABS, Take 2 tablets by mouth every 8 (eight) hours, Disp: 180 tablet, Rfl: 2  celecoxib  (CELEBREX ) 200 MG capsule, Take 200 mg by  mouth daily, Disp: , Rfl:   buPROPion  (WELLBUTRIN  SR) 200 MG 12 hr tablet, TAKE 1 TABLET BY MOUTH IN THE MORNING AND BEFORE BEDTIME, Disp: 180 tablet, Rfl: 3  solifenacin  (VESICARE ) 10 MG tablet, TAKE 1 TABLET BY MOUTH EVERY DAY IN THE MORNING, Disp: 90 tablet, Rfl: 3  OTHER MEDICATION, SHOWER CHAIR  For use as directed.    DX: gait instability, OA knee  ICD10:R26.81, M17.9, Disp: 1 each, Rfl: 0  calcium  carb-cholecalciferol  500-10 MG-MCG TABS tablet, Take 1 tablet by mouth in the morning and 1 tablet before bedtime., Disp: 180 tablet, Rfl: 3  Cholecalciferol  (VITAMIN D3) 50 MCG (2000 UT) TABS, TAKE 1 TABLET BY MOUTH EVERY DAY IN THE MORNING, Disp: 90 tablet, Rfl: 3  albuterol  HFA 108 (90 Base) MCG/ACT inhaler, Inhale 2 puffs into the lungs every 6 (six) hours as needed for Wheezing, Disp: , Rfl:     No current facility-administered medications on file prior to visit.       Allergies:    Review of Patient's Allergies indicates:   Darvon                     Meperidine hcl              Comment:Nausea/vomit   Paxil [paroxetine]      Rash   Zoloft  [sertraline ]     Rash     SCHEDULING INFORMATION:  Preferred Location:    Sedation Type:    Preferred Physician: No Preference       Primary Reason for COLONOSCOPY:    Interval Colonoscopy (follow-up for Polyps)    Primary Reason for EGD:    Other Routine Priority Diagnosis  dysphagia x several years of solid food                            Primary Reason for FLEXIBLE SIGMOIDOSCOPY:         RN Notes to  Scheduler:         RN Assessment Complete: No      Clarita CHRISTELLA Gamble, RN

## 2024-05-19 ENCOUNTER — Telehealth (HOSPITAL_BASED_OUTPATIENT_CLINIC_OR_DEPARTMENT_OTHER): Payer: Self-pay

## 2024-05-19 ENCOUNTER — Encounter (HOSPITAL_BASED_OUTPATIENT_CLINIC_OR_DEPARTMENT_OTHER): Payer: Self-pay | Admitting: Physician Assistant

## 2024-05-19 NOTE — Telephone Encounter (Signed)
 Pt calling for a refill    Voltaren  - will need a refill per patient - pharmacy will not refill it until feb - has already gotten one refill the beginning of Jan and they will not give her another as it's too soon.  Will send to PCP

## 2024-05-19 NOTE — Telephone Encounter (Signed)
 If she has already used 4 tubes in January she will have to wait.  She is written for the max dose 16 grams per day.

## 2024-05-19 NOTE — Telephone Encounter (Signed)
 I called the patient to offer the following post-televisit services.    I called pt to schedule f/u appt. Pt was confused when I relayed the reason for the appt. Pt says she hasn't done any ultrasounds yet and she hasn't started on any weight loss meds. Pt said she will see her shoulder doctor first and then schedule her appt for the ultrasound.     Please copy and paste the provider's request below:    Bel, Shulamit J, PA-C  [facility] Front Desk Pool  Hello! Please book pt with pcp/team PA for f/u ultrasound result/ weight loss med in 1 month (Tele or in person per pt preference)

## 2024-05-19 NOTE — Telephone Encounter (Signed)
 Patient identification verified by 2 forms.    Called placed to patient relayed message below.   Reports she purchased it OTC.   Patient verbalized understanding and agreed with plan.   Denied any further questions or concerns at this time.

## 2024-05-23 ENCOUNTER — Ambulatory Visit (HOSPITAL_BASED_OUTPATIENT_CLINIC_OR_DEPARTMENT_OTHER): Admitting: Physician Assistant

## 2024-05-24 ENCOUNTER — Encounter (HOSPITAL_BASED_OUTPATIENT_CLINIC_OR_DEPARTMENT_OTHER): Payer: Self-pay

## 2024-05-26 ENCOUNTER — Other Ambulatory Visit (HOSPITAL_BASED_OUTPATIENT_CLINIC_OR_DEPARTMENT_OTHER): Payer: Self-pay | Admitting: Physician Assistant

## 2024-05-26 ENCOUNTER — Encounter (HOSPITAL_BASED_OUTPATIENT_CLINIC_OR_DEPARTMENT_OTHER): Payer: Self-pay | Admitting: Registered Nurse

## 2024-05-26 ENCOUNTER — Other Ambulatory Visit: Payer: Self-pay

## 2024-05-26 ENCOUNTER — Ambulatory Visit: Attending: Orthopaedic Surgery | Admitting: Orthopaedic Surgery

## 2024-05-26 ENCOUNTER — Other Ambulatory Visit (HOSPITAL_BASED_OUTPATIENT_CLINIC_OR_DEPARTMENT_OTHER): Payer: Self-pay | Admitting: Internal Medicine

## 2024-05-26 ENCOUNTER — Other Ambulatory Visit (HOSPITAL_BASED_OUTPATIENT_CLINIC_OR_DEPARTMENT_OTHER): Payer: Self-pay

## 2024-05-26 DIAGNOSIS — M19011 Primary osteoarthritis, right shoulder: Secondary | ICD-10-CM | POA: Insufficient documentation

## 2024-05-26 DIAGNOSIS — G8929 Other chronic pain: Secondary | ICD-10-CM | POA: Insufficient documentation

## 2024-05-26 DIAGNOSIS — M25512 Pain in left shoulder: Secondary | ICD-10-CM | POA: Diagnosis present

## 2024-05-26 DIAGNOSIS — F419 Anxiety disorder, unspecified: Secondary | ICD-10-CM

## 2024-05-26 DIAGNOSIS — M19012 Primary osteoarthritis, left shoulder: Secondary | ICD-10-CM | POA: Insufficient documentation

## 2024-05-26 MED ORDER — CHOLECALCIFEROL 25 MCG (1000 UT) PO TABS: 1000.0000 [IU] | ORAL_TABLET | Freq: Every day | ORAL | 3 refills | Status: AC

## 2024-05-26 MED ORDER — CLONAZEPAM 0.5 MG PO TABS: ORAL_TABLET | ORAL | 1 refills | Status: AC

## 2024-05-26 NOTE — Telephone Encounter (Signed)
 PER who is calling: Patient (self), Cheryl Dixon is a 69 year old female has requested a refill of       Klonopin  .        Only complete for controlled medication:    Previously prescribed:  Start Date [id number]         End Date [id number]             Last Office Visit  : 01/18/2024 [name], MD      Last Tele Visit:  05/17/2024 [name], PA-C      Last Physical Exam:  05/27/2013    There are no preventive care reminders to display for this patient.    Other Med Adult:  Most Recent BP Reading(s)  02/06/24 : 163/100        Cholesterol (mg/dL)   Date Value   95/75/7975 212     LOW DENSITY LIPOPROTEIN DIRECT (mg/dL)   Date Value   95/75/7975 151     HIGH DENSITY LIPOPROTEIN (mg/dL)   Date Value   95/75/7975 47     TRIGLYCERIDES (mg/dL)   Date Value   91/82/7976 119         THYROID  SCREEN TSH REFLEX FT4 (uIU/mL)   Date Value   08/20/2022 4.100         No results found for: TSH    HEMOGLOBIN A1C (%)   Date Value   08/20/2022 5.6       No results found for: POCA1C      INR (no units)   Date Value   12/12/2021 1.1   02/16/2007 1.0 (L)   07/03/2006 < 1.0 (L)       SODIUM (mmol/L)   Date Value   01/18/2024 140       POTASSIUM (mmol/L)   Date Value   01/18/2024 4.0           CREATININE (mg/dL)   Date Value   90/77/7974 0.9       Documented patient preferred pharmacies:    [id number] - [place], [place] - [address]  Phone: [phone] Fax: [phone]

## 2024-05-26 NOTE — Progress Notes (Signed)
 BSAS Quarterly Completed  Chart review complete  OUD medication reviewed  UDS orders updated

## 2024-05-27 NOTE — Progress Notes (Signed)
 Orthopedic Office Note    CC: Patient presents with:  Shoulder Pain: bilateral      ORTHOPEDIC PROBLEM LIST:  1. Bilateral shoulder osteoarthritis  2. S/p right TKA, DOS: 03/11/2023, Doherty   3. Open right patella tendon rupture (grade 3), DOI: 03/26/2023, s/p poly exchange, irrigation/wound debridement, patellar tendon repair with cerclage cable, DOS: 03/29/2023, Fleeta Lobe   4. S/p revision right TKA to constrained prosthesis, extensor mechanism reconstruction, patellectomy and ROH, DOS: 05/19/23, Esposito       History of Present Illness  Cheryl Dixon is a 69 year old left-hand dominant female with severe bilateral shoulder osteoarthritis who presents with persistent shoulder pain and functional impairment.    She has longstanding, severe left shoulder pain described as terrible, with sensations of instability and the bone falling off or hanging. The pain radiates from the neck to the chest, arms, and elbows. She experiences profound limitation in range of motion, with pain on any movement, inability to reach behind her back, and difficulty lifting her arm forward. She is unable to perform daily activities such as making a bed, folding towels, or lifting objects due to pain and weakness. She has pain on the right side as well, but is left-hand dominant and notices more disability on left. There is no numbness in the upper arm, but she occasionally experiences numbness in ring and little fingers. She denies recent trauma. The pain is associated with significant emotional distress and frustration due to loss of function.    She has attempted multiple conservative therapies including home remedies, CBD tinctures, acetaminophen  (3-4 times daily), topical diclofenac , and daily celecoxib , with minimal relief. She has used gabapentin (800 mg) on a few occasions, which provided some benefit. She is on suboxone .  She received a steroid injection in both shoulders in May 2025 without much relief. Injection helped  right shoulder more than left. She attempted physical therapy but was unable to participate effectively due to limited use of her arms.    She also has right shoulder pain, which has progressed to be as severe as the left, with similar limitations in movement and pain with activity.     She underwent left knee replacement in 2024, complicated by patellar tendon rupture requiring surgical repair. Since the surgery, she has experienced recurrent falls, including a recent fall requiring EMT assistance. She is unable to straighten her left leg actively and has not been able to do so for 5-6 months. She describes persistent swelling and a squishy sensation at the site of injury. She continues to have episodes of the knee giving out and has difficulty with mobility, walking only about a quarter mile per day at most with a rollator. She lives alone and has accessibility challenges in her apartment, which are being addressed with family support.    She has osteoporosis, previously treated with alendronate  and vitamin D  supplementation. She discontinued vitamin D  due to gastrointestinal intolerance and adverse reactions. She is currently not taking vitamin D .     She has also experienced depression related to her chronic pain and loss of function.    Her son and daughter can come to stay with her.  Her daughter comes in with her today  I did a nerve procedure on her son in the past.  Patient does not smoke though did in the past..  She takes a baby aspirin  daily.  She also has COPD.  Denies any prior injury or trauma to the shoulders.  States that her range of  motion is greatly limited and that she feels weak. Denies any numbness or tingling distally.    Patient Active Problem List:     Opioid use disorder on agonist therapy (HCC)     Tobacco use disorder     Fibrocystic breast     Essential hypertension     Knee pain     Chronic low back pain     OA (osteoarthritis) of knee     H. pylori infection     Major depressive  disorder, recurrent episode, severe (HCC)     Occult blood in stools     Prediabetes     Epigastric pain     Chronic obstructive pulmonary disease (HCC)     Left foot pain     Multiple polyps of colon     Acquired deformity of toe, right     Dislocation of metatarsophalangeal joint of lesser toe, right, sequela     Postmenopausal atrophic vaginitis     Abnormal loss of weight     Aneurysm of ascending aorta without rupture (HCC)     COVID-19 virus infection     Obesity, Class II, BMI 35-39.9, isolated     Chronic dysuria     LLQ pain     Osteoporosis     History of endometriosis     Lung cancer screening     Constipation, drug induced     Weakness     Cerebrovascular accident (CVA) (HCC)     Overactive bladder      [facility] [phone] Option #4     Substance addiction (HCC)     Anxiety     S/P total knee arthroplasty, right     Status post total right knee replacement     Patellar tendon rupture, right, sequela     S/P revision of total knee, right     Gait instability     Pain of right lower extremity     Dysphagia     Thyroid  nodule    .meds    IMAGING: x-ray of the left and right shoulders 08/2023 reveals severe glenohumeral joint osteoarthritis.    She also had films of left shoulder 9/20225 but the pt was sitting and it is very difficult to assess joint except that there looks like there is no joint.    Right knee 03/30/2024  hinged prosthesis looks good.  S/p patellectomy    I personally reviewed and interpreted the imaging during this visit.      PHYSICAL EXAM:  GENERAL: Alert and oriented, in no acute distress  MOOD: Appropriate.   MUSCULOSKELETAL: Evaluation of the bilateral shoulders reveals no gross bony deformities.  Overlying skin is warm, dry and intact. Diffused tenderness around the shoulder. Both with limited motion.  Active FE 30 degrees, further passively to 50 degrees. I cannot passively ER arm due to pain.  Cannot reach behind back.  I can't really assess strength due  to pain.  She has normal sensation over her upper arm/deltoid.  Bilateral upper extremities are neurovascularly intact distally.  Right knee  well healed scar.  Full passive extension but unable to actively extend.  Assessment & Plan   69 year old female here today with severe bilateral shoulder osteoarthritis.  Discussed with the patient that given the severity of her arthritis, a total shoulder replacement is indicated     Primary osteoarthritis of left shoulder  Severe, chronic left shoulder osteoarthritis with advanced degenerative changes and bone-on-bone  on  imaging. She is left-handed and has failed conservative management, including intra-articular injections, topical agents, and oral medications. Persistent, debilitating pain and functional limitation are significantly impairing her activities of daily living. She is seeking surgical intervention. Anticipated outcome is improved pain and function postoperatively.  - Ordered left shoulder CT scan for preoperative planning.  - Initiated scheduling for left shoulder arthroplasty, tentatively targeting July 11, 2024, with consideration for earlier dates if available.  - Requested preoperative medical optimization from Dr. Renda and/or Deirdre Pool.  - Discussed need for updated EKG and possible echocardiogram as part of preoperative assessment.  She did have an echo ordered in 2024 that was never done.  - Recommended continuation of current pain management regimen (acetaminophen , celecoxib , diclofenac  cream) until surgery.  - Daughter requesting something stronger for pt's pain.  I emailed primary care provider to consider gabapentin for additional pain control until surgery.  - Provided instructions for scheduling and preoperative steps, including anesthesia evaluation.    Primary osteoarthritis of right shoulder  Advanced osteoarthritis of the right shoulder with imaging demonstrating severe degenerative changes. She reports the right shoulder is now as  symptomatic as the left, but current management is focused on the left shoulder due to greater pain and dysfunction.  - Reviewed right shoulder imaging and symptoms during visit.    Osteoporosis  Osteoporosis previously managed with alendronate  and vitamin D  supplementation. She has intolerance to higher doses of vitamin D . Osteoporosis is a relevant comorbidity for surgical planning and increases fall risk.  She actually seemed confused about how to take vit D.    - Adjusted vitamin D  supplementation to 1000 IU daily for improved tolerability.  - Discussed importance of vitamin D  for bone health, particularly in the context of upcoming surgery and ongoing physical therapy.  - Advised follow-up with primary care for ongoing osteoporosis management and monitoring of vitamin D  levels.    S/p Revision TKR  no active extension but pt does not have pain and is able to walk.  She follows with Dr. Esposito for this.    I spent a total of 60 minutes on this visit on the date of service (total time includes all activities performed on the date of service) In that time I performed a complete history and physical exam, reviewed and interpreted patient imaging, coordinated the next steps of the patients care, communicated the plan and diagnosis, placed orders, and preformed appropriate documentation.      Jereld maple Spurr, MD, 05/27/2024        Nursing Communication:  None

## 2024-05-30 ENCOUNTER — Other Ambulatory Visit (HOSPITAL_BASED_OUTPATIENT_CLINIC_OR_DEPARTMENT_OTHER): Payer: Self-pay | Admitting: Internal Medicine

## 2024-05-31 NOTE — Telephone Encounter (Signed)
 PER who is calling: Pharmacy, Cheryl Dixon is a 69 year old female has requested a refill of       diclofenac  (VOLTAREN ) 1 % GEL Gel                 Last Office Visit  : 01/18/2024 [name], MD      Last Tele Visit:  05/17/2024 [name], PA-C      Last Physical Exam:  05/27/2013    There are no preventive care reminders to display for this patient.    Other Med Adult:  Most Recent BP Reading(s)  02/06/24 : 163/100        Cholesterol (mg/dL)   Date Value   95/75/7975 212     LOW DENSITY LIPOPROTEIN DIRECT (mg/dL)   Date Value   95/75/7975 151     HIGH DENSITY LIPOPROTEIN (mg/dL)   Date Value   95/75/7975 47     TRIGLYCERIDES (mg/dL)   Date Value   91/82/7976 119         THYROID  SCREEN TSH REFLEX FT4 (uIU/mL)   Date Value   08/20/2022 4.100         No results found for: TSH    HEMOGLOBIN A1C (%)   Date Value   08/20/2022 5.6       No results found for: POCA1C      INR (no units)   Date Value   12/12/2021 1.1   02/16/2007 1.0 (L)   07/03/2006 < 1.0 (L)       SODIUM (mmol/L)   Date Value   01/18/2024 140       POTASSIUM (mmol/L)   Date Value   01/18/2024 4.0           CREATININE (mg/dL)   Date Value   90/77/7974 0.9       Documented patient preferred pharmacies:    [id number] - [place], [place] - [address]  Phone: [phone] Fax: [phone]

## 2024-06-08 ENCOUNTER — Ambulatory Visit: Admitting: Physician Assistant

## 2024-06-14 ENCOUNTER — Ambulatory Visit: Admitting: Family Medicine

## 2024-06-16 ENCOUNTER — Ambulatory Visit

## 2024-06-24 ENCOUNTER — Ambulatory Visit

## 2024-06-28 ENCOUNTER — Ambulatory Visit: Admitting: Family Medicine

## 2024-07-11 ENCOUNTER — Ambulatory Visit (HOSPITAL_BASED_OUTPATIENT_CLINIC_OR_DEPARTMENT_OTHER): Admit: 2024-07-11 | Payer: Self-pay | Admitting: Orthopaedic Surgery

## 2024-07-11 ENCOUNTER — Encounter (HOSPITAL_BASED_OUTPATIENT_CLINIC_OR_DEPARTMENT_OTHER): Payer: Self-pay

## 2024-07-11 DIAGNOSIS — M19012 Primary osteoarthritis, left shoulder: Secondary | ICD-10-CM | POA: Insufficient documentation

## 2024-07-26 ENCOUNTER — Ambulatory Visit: Admitting: Family Medicine

## 2024-07-29 ENCOUNTER — Ambulatory Visit: Admitting: Physician Assistant

## 2024-08-09 ENCOUNTER — Ambulatory Visit: Admitting: Family Medicine

## 2024-08-23 ENCOUNTER — Ambulatory Visit: Admitting: Family Medicine

## 2024-09-06 ENCOUNTER — Ambulatory Visit: Admitting: Family Medicine

## 2024-09-20 ENCOUNTER — Ambulatory Visit: Admitting: Family Medicine

## 2024-10-04 ENCOUNTER — Ambulatory Visit: Admitting: Family Medicine

## 2024-10-18 ENCOUNTER — Ambulatory Visit: Admitting: Family Medicine
